# Patient Record
Sex: Female | Born: 1955
Health system: Southern US, Community
[De-identification: ages and names within clinical notes are randomized; demographics above are authoritative.]

## PROBLEM LIST (undated history)

## (undated) DIAGNOSIS — D72829 Elevated white blood cell count, unspecified: Secondary | ICD-10-CM

## (undated) DIAGNOSIS — F411 Generalized anxiety disorder: Secondary | ICD-10-CM

## (undated) DIAGNOSIS — T8859XA Other complications of anesthesia, initial encounter: Secondary | ICD-10-CM

## (undated) DIAGNOSIS — K219 Gastro-esophageal reflux disease without esophagitis: Secondary | ICD-10-CM

## (undated) DIAGNOSIS — T4145XA Adverse effect of unspecified anesthetic, initial encounter: Secondary | ICD-10-CM

## (undated) DIAGNOSIS — R5383 Other fatigue: Secondary | ICD-10-CM

## (undated) DIAGNOSIS — G4700 Insomnia, unspecified: Secondary | ICD-10-CM

## (undated) DIAGNOSIS — E785 Hyperlipidemia, unspecified: Secondary | ICD-10-CM

## (undated) DIAGNOSIS — C801 Malignant (primary) neoplasm, unspecified: Secondary | ICD-10-CM

## (undated) DIAGNOSIS — Z9889 Other specified postprocedural states: Secondary | ICD-10-CM

## (undated) DIAGNOSIS — N951 Menopausal and female climacteric states: Secondary | ICD-10-CM

## (undated) DIAGNOSIS — M542 Cervicalgia: Secondary | ICD-10-CM

## (undated) DIAGNOSIS — J309 Allergic rhinitis, unspecified: Secondary | ICD-10-CM

## (undated) DIAGNOSIS — Z9221 Personal history of antineoplastic chemotherapy: Secondary | ICD-10-CM

## (undated) DIAGNOSIS — E8881 Metabolic syndrome: Secondary | ICD-10-CM

## (undated) DIAGNOSIS — B351 Tinea unguium: Secondary | ICD-10-CM

## (undated) DIAGNOSIS — E663 Overweight: Secondary | ICD-10-CM

## (undated) DIAGNOSIS — N289 Disorder of kidney and ureter, unspecified: Secondary | ICD-10-CM

## (undated) DIAGNOSIS — J45909 Unspecified asthma, uncomplicated: Secondary | ICD-10-CM

## (undated) DIAGNOSIS — E559 Vitamin D deficiency, unspecified: Secondary | ICD-10-CM

## (undated) DIAGNOSIS — J40 Bronchitis, not specified as acute or chronic: Secondary | ICD-10-CM

## (undated) DIAGNOSIS — R112 Nausea with vomiting, unspecified: Secondary | ICD-10-CM

## (undated) DIAGNOSIS — M4802 Spinal stenosis, cervical region: Secondary | ICD-10-CM

## (undated) DIAGNOSIS — G43009 Migraine without aura, not intractable, without status migrainosus: Secondary | ICD-10-CM

## (undated) DIAGNOSIS — L989 Disorder of the skin and subcutaneous tissue, unspecified: Secondary | ICD-10-CM

## (undated) DIAGNOSIS — M1990 Unspecified osteoarthritis, unspecified site: Secondary | ICD-10-CM

## (undated) DIAGNOSIS — I509 Heart failure, unspecified: Secondary | ICD-10-CM

## (undated) DIAGNOSIS — M549 Dorsalgia, unspecified: Secondary | ICD-10-CM

## (undated) DIAGNOSIS — I10 Essential (primary) hypertension: Secondary | ICD-10-CM

## (undated) DIAGNOSIS — R5381 Other malaise: Secondary | ICD-10-CM

## (undated) DIAGNOSIS — Z79899 Other long term (current) drug therapy: Secondary | ICD-10-CM

## (undated) HISTORY — PX: TONSILLECTOMY: SUR1361

## (undated) HISTORY — DX: Metabolic syndrome: E88.810

## (undated) HISTORY — DX: Overweight: E66.3

## (undated) HISTORY — DX: Generalized anxiety disorder: F41.1

## (undated) HISTORY — PX: NECK SURGERY: SHX720

## (undated) HISTORY — DX: Tinea unguium: B35.1

## (undated) HISTORY — DX: Metabolic syndrome: E88.81

## (undated) HISTORY — DX: Heart failure, unspecified: I50.9

## (undated) HISTORY — DX: Allergic rhinitis, unspecified: J30.9

## (undated) HISTORY — DX: Other malaise: R53.81

## (undated) HISTORY — DX: Essential (primary) hypertension: I10

## (undated) HISTORY — DX: Dorsalgia, unspecified: M54.9

## (undated) HISTORY — DX: Menopausal and female climacteric states: N95.1

## (undated) HISTORY — PX: BACK SURGERY: SHX140

## (undated) HISTORY — DX: Other long term (current) drug therapy: Z79.899

## (undated) HISTORY — PX: ABDOMINAL HYSTERECTOMY: SHX81

## (undated) HISTORY — DX: Gastro-esophageal reflux disease without esophagitis: K21.9

## (undated) HISTORY — DX: Vitamin D deficiency, unspecified: E55.9

## (undated) HISTORY — DX: Disorder of the skin and subcutaneous tissue, unspecified: L98.9

## (undated) HISTORY — DX: Spinal stenosis, cervical region: M48.02

## (undated) HISTORY — DX: Other fatigue: R53.83

## (undated) HISTORY — DX: Cervicalgia: M54.2

## (undated) HISTORY — DX: Elevated white blood cell count, unspecified: D72.829

## (undated) HISTORY — DX: Migraine without aura, not intractable, without status migrainosus: G43.009

## (undated) HISTORY — DX: Insomnia, unspecified: G47.00

## (undated) HISTORY — DX: Hyperlipidemia, unspecified: E78.5

---

## 2000-04-01 ENCOUNTER — Other Ambulatory Visit: Admission: RE | Admit: 2000-04-01 | Discharge: 2000-04-01 | Payer: Self-pay | Admitting: Obstetrics and Gynecology

## 2002-04-01 ENCOUNTER — Encounter: Payer: Self-pay | Admitting: Physical Medicine and Rehabilitation

## 2002-04-01 ENCOUNTER — Encounter
Admission: RE | Admit: 2002-04-01 | Discharge: 2002-04-01 | Payer: Self-pay | Admitting: Physical Medicine and Rehabilitation

## 2003-05-20 ENCOUNTER — Inpatient Hospital Stay (HOSPITAL_COMMUNITY): Admission: RE | Admit: 2003-05-20 | Discharge: 2003-05-26 | Payer: Self-pay | Admitting: Specialist

## 2004-07-12 ENCOUNTER — Inpatient Hospital Stay (HOSPITAL_COMMUNITY): Admission: RE | Admit: 2004-07-12 | Discharge: 2004-07-15 | Payer: Self-pay | Admitting: Specialist

## 2004-08-29 ENCOUNTER — Ambulatory Visit: Payer: Self-pay

## 2005-05-29 ENCOUNTER — Ambulatory Visit: Payer: Self-pay

## 2005-05-30 ENCOUNTER — Emergency Department: Payer: Self-pay | Admitting: Emergency Medicine

## 2005-12-03 ENCOUNTER — Ambulatory Visit: Payer: Self-pay

## 2006-01-08 ENCOUNTER — Ambulatory Visit: Payer: Self-pay | Admitting: Pain Medicine

## 2006-01-20 ENCOUNTER — Ambulatory Visit: Payer: Self-pay | Admitting: Pain Medicine

## 2006-01-28 ENCOUNTER — Ambulatory Visit: Payer: Self-pay | Admitting: Pain Medicine

## 2006-02-10 ENCOUNTER — Ambulatory Visit: Payer: Self-pay | Admitting: Pain Medicine

## 2006-04-10 ENCOUNTER — Ambulatory Visit: Payer: Self-pay | Admitting: Pain Medicine

## 2006-04-18 ENCOUNTER — Ambulatory Visit: Payer: Self-pay | Admitting: Pain Medicine

## 2006-04-23 ENCOUNTER — Ambulatory Visit: Payer: Self-pay | Admitting: Pain Medicine

## 2006-05-14 ENCOUNTER — Ambulatory Visit: Payer: Self-pay | Admitting: Pain Medicine

## 2006-06-04 ENCOUNTER — Ambulatory Visit: Payer: Self-pay | Admitting: Pain Medicine

## 2006-07-02 ENCOUNTER — Ambulatory Visit: Payer: Self-pay | Admitting: Pain Medicine

## 2006-07-29 ENCOUNTER — Ambulatory Visit: Payer: Self-pay | Admitting: Physician Assistant

## 2006-08-27 ENCOUNTER — Ambulatory Visit: Payer: Self-pay | Admitting: Physician Assistant

## 2006-09-16 ENCOUNTER — Ambulatory Visit: Payer: Self-pay | Admitting: Pain Medicine

## 2006-10-06 ENCOUNTER — Ambulatory Visit: Payer: Self-pay | Admitting: Pain Medicine

## 2006-11-11 ENCOUNTER — Ambulatory Visit: Payer: Self-pay | Admitting: Otolaryngology

## 2006-11-19 ENCOUNTER — Ambulatory Visit: Payer: Self-pay | Admitting: Physician Assistant

## 2006-12-05 ENCOUNTER — Ambulatory Visit: Payer: Self-pay

## 2006-12-17 ENCOUNTER — Ambulatory Visit: Payer: Self-pay | Admitting: Physician Assistant

## 2006-12-30 ENCOUNTER — Ambulatory Visit: Payer: Self-pay | Admitting: Pain Medicine

## 2007-01-19 ENCOUNTER — Ambulatory Visit: Payer: Self-pay | Admitting: Physician Assistant

## 2007-02-11 ENCOUNTER — Ambulatory Visit: Payer: Self-pay | Admitting: Physician Assistant

## 2007-02-20 ENCOUNTER — Ambulatory Visit: Payer: Self-pay | Admitting: Obstetrics and Gynecology

## 2007-02-23 ENCOUNTER — Ambulatory Visit: Payer: Self-pay | Admitting: Obstetrics and Gynecology

## 2007-03-24 ENCOUNTER — Ambulatory Visit: Payer: Self-pay | Admitting: Pain Medicine

## 2007-05-14 ENCOUNTER — Ambulatory Visit: Payer: Self-pay | Admitting: Physician Assistant

## 2007-06-15 ENCOUNTER — Ambulatory Visit: Payer: Self-pay | Admitting: Physician Assistant

## 2007-07-09 ENCOUNTER — Ambulatory Visit: Payer: Self-pay | Admitting: Pain Medicine

## 2007-07-15 ENCOUNTER — Ambulatory Visit: Payer: Self-pay | Admitting: Physician Assistant

## 2007-08-19 ENCOUNTER — Ambulatory Visit: Payer: Self-pay | Admitting: Pain Medicine

## 2007-09-29 ENCOUNTER — Ambulatory Visit: Payer: Self-pay | Admitting: Physician Assistant

## 2007-10-21 ENCOUNTER — Ambulatory Visit: Payer: Self-pay | Admitting: Physician Assistant

## 2007-11-05 ENCOUNTER — Ambulatory Visit: Payer: Self-pay | Admitting: Pain Medicine

## 2008-01-19 ENCOUNTER — Ambulatory Visit: Payer: Self-pay

## 2012-02-18 ENCOUNTER — Ambulatory Visit: Payer: Self-pay | Admitting: Emergency Medicine

## 2012-02-19 ENCOUNTER — Ambulatory Visit: Payer: Self-pay | Admitting: Emergency Medicine

## 2012-02-19 LAB — HEPATIC FUNCTION PANEL A (ARMC)
Albumin: 3.6 g/dL (ref 3.4–5.0)
Alkaline Phosphatase: 96 U/L (ref 50–136)
Bilirubin, Direct: 0.1 mg/dL (ref 0.00–0.20)
Bilirubin,Total: 0.4 mg/dL (ref 0.2–1.0)
Total Protein: 6.9 g/dL (ref 6.4–8.2)

## 2012-02-20 ENCOUNTER — Ambulatory Visit: Payer: Self-pay | Admitting: Emergency Medicine

## 2012-02-21 LAB — PATHOLOGY REPORT

## 2012-04-06 ENCOUNTER — Ambulatory Visit: Payer: Self-pay | Admitting: Family Medicine

## 2012-04-06 LAB — HM MAMMOGRAPHY: HM Mammogram: NORMAL

## 2012-04-07 ENCOUNTER — Ambulatory Visit: Payer: Self-pay | Admitting: Family Medicine

## 2013-02-04 LAB — HM PAP SMEAR: HM PAP: NORMAL

## 2013-02-06 ENCOUNTER — Ambulatory Visit: Payer: Self-pay | Admitting: Family Medicine

## 2013-04-27 LAB — HM DEXA SCAN

## 2013-04-28 DIAGNOSIS — M5010 Cervical disc disorder with radiculopathy, unspecified cervical region: Secondary | ICD-10-CM | POA: Insufficient documentation

## 2013-07-23 DIAGNOSIS — M5412 Radiculopathy, cervical region: Secondary | ICD-10-CM | POA: Insufficient documentation

## 2013-07-23 DIAGNOSIS — M4802 Spinal stenosis, cervical region: Secondary | ICD-10-CM | POA: Insufficient documentation

## 2013-07-27 LAB — LIPID PANEL
CHOLESTEROL: 165 mg/dL (ref 0–200)
HDL: 30 mg/dL — AB (ref 35–70)
TRIGLYCERIDES: 437 mg/dL — AB (ref 40–160)

## 2013-07-27 LAB — HEMOGLOBIN A1C: HEMOGLOBIN A1C: 5.7 % (ref 4.0–6.0)

## 2013-11-30 ENCOUNTER — Encounter: Payer: Self-pay | Admitting: Neurology

## 2013-12-01 ENCOUNTER — Encounter (INDEPENDENT_AMBULATORY_CARE_PROVIDER_SITE_OTHER): Payer: Self-pay

## 2013-12-01 ENCOUNTER — Encounter: Payer: Self-pay | Admitting: Neurology

## 2013-12-01 ENCOUNTER — Encounter (INDEPENDENT_AMBULATORY_CARE_PROVIDER_SITE_OTHER): Payer: Self-pay | Admitting: Neurology

## 2013-12-01 VITALS — BP 121/75 | HR 71 | Ht 63.0 in | Wt 181.2 lb

## 2013-12-01 DIAGNOSIS — M5412 Radiculopathy, cervical region: Secondary | ICD-10-CM

## 2013-12-01 DIAGNOSIS — M5010 Cervical disc disorder with radiculopathy, unspecified cervical region: Secondary | ICD-10-CM

## 2014-02-28 ENCOUNTER — Ambulatory Visit: Payer: Self-pay | Admitting: Gastroenterology

## 2014-02-28 LAB — HM COLONOSCOPY: HM Colonoscopy: NORMAL

## 2014-03-04 ENCOUNTER — Ambulatory Visit: Payer: Self-pay | Admitting: Gastroenterology

## 2014-07-05 NOTE — Op Note (Signed)
PATIENT NAME:  Jessica Burgess, SHAFRAN MR#:  [id number] DATE OF BIRTH:  03/18/1956  DATE OF PROCEDURE:  02/20/2012  PREOPERATIVE DIAGNOSIS: Acute cholecystitis.   POSTOPERATIVE DIAGNOSIS: Acute cholecystitis.   PROCEDURE PERFORMED: Laparoscopic cholecystectomy.   SURGEON:  Vella Kohler, MD.   ANESTHESIA: General.   INDICATION FOR PROCEDURE: This patient was seen by me because of pain in the abdomen. She had ultrasound of gallbladder done which showed gallstone and was a big, large gallstone. The patient was then brought to surgery.   DESCRIPTION OF PROCEDURE:  Under general anesthesia, the abdomen was then prepped and draped. A small incision was made above the umbilicus. After cutting skin and subcutaneous tissue, the fascia was reached. Fascia was cut. The abdomen was entered under direct vision. Trocar was inserted. Another trocar was put in the epigastric region, two 5-mm put in the right upper quadrant of the abdomen. The gallbladder was visualized and was found to have a lot of adhesions. The gallbladder was then lifted up. Adhesions were then released down until all the gallbladder was freed up. After that, the cystic artery was then clipped and cut and the cholangiogram was then performed. A Kumar catheter showed normal common bile duct. After that, the cystic duct was clipped and cut and the gallbladder was removed from the liver bed without any problem from the bellybutton. The patient was found to have one very large stone and a lot of small stones. After this, we irrigated the whole area and made sure there was no bleeding there and then removed all the ports from the abdomen and made sure there was no bleeding. The umbilical area was then closed with interrupted 0 Vicryl sutures and the staples were applied. Marcaine was injected. The patient tolerated the procedure well and was sent to the recovery room in satisfactory condition.  ____________________________ Welford Roche Phylis Bougie,  MD msh:ap D: 02/20/2012 15:06:17 ET                T: 02/20/2012 15:33:33 ET              JOB#: [id number] cc: Bethena Roys. Ancil Boozer, MD Sharene Butters MD ELECTRONICALLY SIGNED 02/24/2012 13:55

## 2014-08-21 ENCOUNTER — Other Ambulatory Visit: Payer: Self-pay | Admitting: Family Medicine

## 2014-08-25 ENCOUNTER — Ambulatory Visit (INDEPENDENT_AMBULATORY_CARE_PROVIDER_SITE_OTHER): Payer: Medicare Other | Admitting: Family Medicine

## 2014-08-25 ENCOUNTER — Other Ambulatory Visit: Payer: Self-pay | Admitting: Family Medicine

## 2014-08-25 ENCOUNTER — Encounter: Payer: Self-pay | Admitting: Family Medicine

## 2014-08-25 VITALS — BP 118/66 | HR 68 | Temp 98.1°F | Resp 18 | Ht 63.5 in | Wt 178.3 lb

## 2014-08-25 DIAGNOSIS — B351 Tinea unguium: Secondary | ICD-10-CM | POA: Insufficient documentation

## 2014-08-25 DIAGNOSIS — G4700 Insomnia, unspecified: Secondary | ICD-10-CM | POA: Diagnosis not present

## 2014-08-25 DIAGNOSIS — E8881 Metabolic syndrome: Secondary | ICD-10-CM | POA: Insufficient documentation

## 2014-08-25 DIAGNOSIS — F32A Depression, unspecified: Secondary | ICD-10-CM

## 2014-08-25 DIAGNOSIS — E669 Obesity, unspecified: Secondary | ICD-10-CM | POA: Insufficient documentation

## 2014-08-25 DIAGNOSIS — E559 Vitamin D deficiency, unspecified: Secondary | ICD-10-CM | POA: Insufficient documentation

## 2014-08-25 DIAGNOSIS — M542 Cervicalgia: Secondary | ICD-10-CM

## 2014-08-25 DIAGNOSIS — G43009 Migraine without aura, not intractable, without status migrainosus: Secondary | ICD-10-CM | POA: Diagnosis not present

## 2014-08-25 DIAGNOSIS — F418 Other specified anxiety disorders: Secondary | ICD-10-CM

## 2014-08-25 DIAGNOSIS — Z1231 Encounter for screening mammogram for malignant neoplasm of breast: Secondary | ICD-10-CM

## 2014-08-25 DIAGNOSIS — B009 Herpesviral infection, unspecified: Secondary | ICD-10-CM | POA: Insufficient documentation

## 2014-08-25 DIAGNOSIS — N951 Menopausal and female climacteric states: Secondary | ICD-10-CM | POA: Insufficient documentation

## 2014-08-25 DIAGNOSIS — G8929 Other chronic pain: Secondary | ICD-10-CM | POA: Insufficient documentation

## 2014-08-25 DIAGNOSIS — R739 Hyperglycemia, unspecified: Secondary | ICD-10-CM | POA: Insufficient documentation

## 2014-08-25 DIAGNOSIS — Z789 Other specified health status: Secondary | ICD-10-CM | POA: Insufficient documentation

## 2014-08-25 DIAGNOSIS — E785 Hyperlipidemia, unspecified: Secondary | ICD-10-CM | POA: Insufficient documentation

## 2014-08-25 DIAGNOSIS — M549 Dorsalgia, unspecified: Secondary | ICD-10-CM

## 2014-08-25 DIAGNOSIS — F419 Anxiety disorder, unspecified: Secondary | ICD-10-CM

## 2014-08-25 DIAGNOSIS — F329 Major depressive disorder, single episode, unspecified: Secondary | ICD-10-CM

## 2014-08-25 DIAGNOSIS — K219 Gastro-esophageal reflux disease without esophagitis: Secondary | ICD-10-CM | POA: Insufficient documentation

## 2014-08-25 DIAGNOSIS — Z9229 Personal history of other drug therapy: Secondary | ICD-10-CM | POA: Insufficient documentation

## 2014-08-25 MED ORDER — ESTRADIOL 0.5 MG PO TABS: 0.5000 mg | ORAL_TABLET | Freq: Every day | ORAL | Status: DC

## 2014-08-25 MED ORDER — OXYCODONE-ACETAMINOPHEN 10-325 MG PO TABS: 1.0000 | ORAL_TABLET | Freq: Four times a day (QID) | ORAL | Status: DC | PRN

## 2014-08-25 MED ORDER — ATORVASTATIN CALCIUM 40 MG PO TABS: 40.0000 mg | ORAL_TABLET | Freq: Every day | ORAL | Status: DC

## 2014-08-25 MED ORDER — OMEPRAZOLE 20 MG PO CPDR: 20.0000 mg | DELAYED_RELEASE_CAPSULE | Freq: Every day | ORAL | Status: DC

## 2014-08-25 MED ORDER — ZOLPIDEM TARTRATE 10 MG PO TABS: ORAL_TABLET | ORAL | Status: DC

## 2014-08-25 NOTE — Progress Notes (Signed)
Name: Jessica Burgess   MRN: [id number]    DOB: 1955-09-02   Date:08/25/2014       Progress Note  Subjective  Chief Complaint  Chief Complaint  Patient presents with  . Medication Refill    3 month F/U  . Depression    unchanged with Wellbutrin  . Insomnia    Falls asleep within the hour with medication, Total 6 hours nightly  . Allergic Rhinitis     Improving with medication  . Menopause    Severe Hot Flashes, Excessive Sweating and Mood Swings.  . Migraine    unchanged having to take 2 Imitrex to make them go away     HPI  Chronic neck pain: she has spinal stenosis and disc herniation, pain is 4/10, can get to a 10/10, she has seen a neurosurgeon and is planning on having surgery this Fall. She is taking oxycodone as prescribed, she is not sharing her prescription or selling. She denies side effects - no constipation, or drowsiness.   Migraine: she has headaches from neck pain, but her migraines as described as throbbing, associated with nausea, but no vomiting, pain can be on right side, left side of a band around her scalp, she also has allodynia. Having about 8 episodes per month.  Last week was a hard week. Imitrex seems to help  Hot Flashes:  She was stable with premarin, but has noticed increase in symptoms since she started Wellbutrin.  She denies hot flashes.  Dyslipidemia: reviewed labs done 08/22/14 and is improving, HDL was 40, LDL was 51, triglycerides down to 235, she is doing well. Taking atorvastatin   Patient Active Problem List   Diagnosis Date Noted  . Back pain, chronic 08/25/2014  . Insomnia, persistent 08/25/2014  . Chronic cervical pain 08/25/2014  . Anxiety and depression 08/25/2014  . Dyslipidemia 08/25/2014  . Gastro-esophageal reflux disease without esophagitis 08/25/2014  . H/O high risk medication treatment 08/25/2014  . Blood glucose elevated 08/25/2014  . Migraine without aura and without status migrainosus, not intractable 08/25/2014  .  Climacteric 08/25/2014  . Dysmetabolic syndrome [id number]  . Fungal infection of toenail 08/25/2014  . Obesity (BMI 30.0-34.9) 08/25/2014  . Vitamin D deficiency 08/25/2014  . Engages in travel abroad 08/25/2014  . Cervical spinal stenosis 07/23/2013  . Cervical disc disorder with radiculopathy 04/28/2013    Past Surgical History  Procedure Laterality Date  . Neck surgery      x3  . Back surgery      x2 Lower   . Tonsillectomy    . Abdominal hysterectomy      Family History  Problem Relation Age of Onset  . Depression Mother   . Migraines Mother   . Dementia Father   . Hyperlipidemia Brother     History   Social History  . Marital Status: Single    Spouse Name: N/A  . Number of Children: 0  . Years of Education: 12+   Occupational History  .    [employer] Disabled     Social History Main Topics  . Smoking status: Former Smoker -- 1.50 packs/day for 20 years    Types: Cigarettes    Quit date: 08/25/1999  . Smokeless tobacco: Never Used  . Alcohol Use: No  . Drug Use: No  . Sexual Activity: No   Other Topics Concern  . Not on file   Social History Narrative   Patient is single.    Patient lives with roommates.  Patient on disability    Patient has no children.    Patient has some college      Current outpatient prescriptions:  .  aspirin 81 MG tablet, Take 1 tablet by mouth daily., Disp: , Rfl:  .  atorvastatin (LIPITOR) 40 MG tablet, Take 1 tablet (40 mg total) by mouth daily., Disp: 90 tablet, Rfl: 4 .  Biotin 2500 MCG CAPS, Take by mouth., Disp: , Rfl:  .  Cholecalciferol (VITAMIN D-1000 MAX ST) 1000 UNITS tablet, Take 1 tablet by mouth 2 (two) times daily., Disp: , Rfl:  .  clonazePAM (KLONOPIN) 0.5 MG tablet, Take 1 tablet by mouth daily as needed., Disp: , Rfl: 0 .  Cyanocobalamin (B-12 PO), Take by mouth., Disp: , Rfl:  .  cyclobenzaprine (FLEXERIL) 10 MG tablet, Take 10 mg by mouth 3 (three) times daily as needed for muscle spasms., Disp: , Rfl:  .   estradiol (ESTRACE) 0.5 MG tablet, Take 1 tablet (0.5 mg total) by mouth daily., Disp: 90 tablet, Rfl: 4 .  ferrous sulfate (IRON SUPPLEMENT) 325 (65 FE) MG tablet, Take 1 tablet by mouth daily., Disp: , Rfl:  .  FLUoxetine (PROZAC) 20 MG capsule, take 3 capsules by mouth once daily, Disp: 270 capsule, Rfl: 1 .  fluticasone (FLONASE) 50 MCG/ACT nasal spray, Place 2 sprays into both nostrils as needed. , Disp: , Rfl:  .  loratadine (CLARITIN) 10 MG tablet, Take 10 mg by mouth daily., Disp: , Rfl:  .  Magnesium Oxide 500 MG CAPS, Take 500 mg by mouth 2 (two) times daily. , Disp: , Rfl:  .  Multiple Vitamins-Minerals (MULTIVITAMIN PO), Take by mouth., Disp: , Rfl:  .  Omega-3 Fatty Acids (FISH OIL) 1200 MG CAPS, Take 1,200 mg by mouth 2 (two) times daily. , Disp: , Rfl:  .  omeprazole (PRILOSEC) 20 MG capsule, Take 1 capsule (20 mg total) by mouth daily., Disp: 90 capsule, Rfl: 1 .  oxyCODONE-acetaminophen (PERCOCET) 10-325 MG per tablet, Take 1 tablet by mouth every 6 (six) hours as needed for pain., Disp: 100 tablet, Rfl: 0 .  SUMAtriptan (IMITREX) 100 MG tablet, Take 100 mg by mouth every 2 (two) hours as needed for migraine or headache. May repeat in 2 hours if headache persists or recurs., Disp: , Rfl:  .  zolpidem (AMBIEN) 10 MG tablet, take 1 tablet by mouth as directed IN THE EVENING for sleep, Disp: 90 tablet, Rfl: 0 .  oxyCODONE-acetaminophen (PERCOCET) 10-325 MG per tablet, Take 1 tablet by mouth every 6 (six) hours as needed for pain., Disp: 100 tablet, Rfl: 0 .  oxyCODONE-acetaminophen (PERCOCET) 10-325 MG per tablet, Take 1 tablet by mouth every 6 (six) hours as needed for pain., Disp: 100 tablet, Rfl: 0  No Known Allergies   ROS  Constitutional: Negative for fever or weight change.  Respiratory: Negative for cough and shortness of breath.   Cardiovascular: Negative for chest pain or palpitations.  Gastrointestinal: Negative for abdominal pain, no bowel changes.  Musculoskeletal:  Negative for gait problem or joint swelling.  Skin: Negative for rash.  Neurological: Negative for dizziness but has recurrent headaches  No other specific complaints in a complete review of systems (except as listed in HPI above).  Objective  Filed Vitals:   08/25/14 0825  BP: 118/66  Pulse: 68  Temp: 98.1 F (36.7 C)  TempSrc: Oral  Resp: 18  Height: 5' 3.5" (1.613 m)  Weight: 178 lb 4.8 oz (80.876 kg)  SpO2: 96%  Physical Exam Constitutional: Patient appears well-developed and well-nourished. No distress. Obese  HENT: Head: Normocephalic and atraumatic. Ears: B TMs ok, no erythema or effusion; Nose: Nose normal. Mouth/Throat: Oropharynx is clear and moist. No oropharyngeal exudate.  Eyes: Conjunctivae and EOM are normal. Pupils are equal, round, and reactive to light. No scleral icterus.  Neck: Decrease rom of neck, tender to touch posteriorly No JVD present. No thyromegaly present.  Cardiovascular: Normal rate, regular rhythm and normal heart sounds.  No murmur heard. No BLE edema. Pulmonary/Chest: Effort normal and breath sounds normal. No respiratory distress. Abdominal: Soft. No distension. There is no tenderness. no masses Musculoskeletal: Normal range of motion, no joint effusions. No gross deformities Neurological: he is alert and oriented to person, place, and time. No cranial nerve deficit. Coordination, balance, strength, speech and gait are normal.  Skin: Skin is warm and dry. No rash noted. No erythema.  Psychiatric: Patient has a normal mood and affect. behavior is normal. Judgment and thought content normal.     PHQ2/9: Depression screen PHQ 2/9 08/25/2014  Decreased Interest 1  Down, Depressed, Hopeless 2  PHQ - 2 Score 3  Altered sleeping 3  Tired, decreased energy 3  Change in appetite 0  Feeling bad or failure about yourself  0  Trouble concentrating 1  Moving slowly or fidgety/restless 0  Suicidal thoughts 0  PHQ-9 Score 10  Difficult doing  work/chores Not difficult at all     Fall Risk: Fall Risk  08/25/2014  Falls in the past year? Yes  Number falls in past yr: 1  Injury with Fall? No    Assessment & Plan  1. Chronic cervical pain She will have neck surgery /revision this Fall in [place], she has daily neck pain and migraine headaches is getting worse - oxyCODONE-acetaminophen (PERCOCET) 10-325 MG per tablet; Take 1 tablet by mouth every 6 (six) hours as needed for pain.  Dispense: 100 tablet; Refill: 0 - oxyCODONE-acetaminophen (PERCOCET) 10-325 MG per tablet; Take 1 tablet by mouth every 6 (six) hours as needed for pain.  Dispense: 100 tablet; Refill: 0 - omeprazole (PRILOSEC) 20 MG capsule; Take 1 capsule (20 mg total) by mouth daily.  Dispense: 90 capsule; Refill: 1  2. Migraine without aura and without status migrainosus, not intractable Responds to Imitrex, but sometimes seems to be triggered by neck pain   3. Insomnia, persistent Stable on medication - zolpidem (AMBIEN) 10 MG tablet; take 1 tablet by mouth as directed IN THE EVENING for sleep  Dispense: 90 tablet; Refill: 0  4. Obesity (BMI 30.0-34.9) She has been following a low carb diet, and walks twice daily, lost another pound since last visit , continue to hard work  5. Climacteric She still has some hot flashes, but seems to have gotten worse since started Wellbturin, discussed [facility] studies again - estradiol (ESTRACE) 0.5 MG tablet; Take 1 tablet (0.5 mg total) by mouth daily.  Dispense: 90 tablet; Refill: 4  6. Dyslipidemia Reviewed labs, improving, continue medications and dietary modification - atorvastatin (LIPITOR) 40 MG tablet; Take 1 tablet (40 mg total) by mouth daily.  Dispense: 90 tablet; Refill: 4  7. Anxiety and depression She continues to have fatigue, does not feel sad, states fluoxetine seems to be working, but Wellbutrin did not increase energy and caused her to sweat too much. We will stop medication

## 2014-08-29 ENCOUNTER — Ambulatory Visit
Admission: RE | Admit: 2014-08-29 | Discharge: 2014-08-29 | Disposition: A | Discharge status: Home / Self Care | Payer: Medicare Other | Source: Ambulatory Visit | Attending: Family Medicine | Admitting: Family Medicine

## 2014-08-29 ENCOUNTER — Other Ambulatory Visit: Payer: Self-pay | Admitting: Family Medicine

## 2014-08-29 DIAGNOSIS — Z1231 Encounter for screening mammogram for malignant neoplasm of breast: Secondary | ICD-10-CM | POA: Insufficient documentation

## 2014-11-02 ENCOUNTER — Other Ambulatory Visit: Payer: Self-pay | Admitting: Neurosurgery

## 2014-11-19 ENCOUNTER — Other Ambulatory Visit: Payer: Self-pay | Admitting: Family Medicine

## 2014-11-25 ENCOUNTER — Ambulatory Visit (INDEPENDENT_AMBULATORY_CARE_PROVIDER_SITE_OTHER): Payer: Medicare Other | Admitting: Family Medicine

## 2014-11-25 ENCOUNTER — Encounter: Payer: Self-pay | Admitting: Family Medicine

## 2014-11-25 ENCOUNTER — Ambulatory Visit: Payer: Medicare Other | Admitting: Family Medicine

## 2014-11-25 VITALS — BP 116/66 | HR 94 | Temp 97.5°F | Resp 16 | Ht 64.0 in | Wt 180.2 lb

## 2014-11-25 DIAGNOSIS — G43009 Migraine without aura, not intractable, without status migrainosus: Secondary | ICD-10-CM

## 2014-11-25 DIAGNOSIS — F419 Anxiety disorder, unspecified: Secondary | ICD-10-CM

## 2014-11-25 DIAGNOSIS — F418 Other specified anxiety disorders: Secondary | ICD-10-CM | POA: Diagnosis not present

## 2014-11-25 DIAGNOSIS — M5010 Cervical disc disorder with radiculopathy, unspecified cervical region: Secondary | ICD-10-CM

## 2014-11-25 DIAGNOSIS — G4700 Insomnia, unspecified: Secondary | ICD-10-CM

## 2014-11-25 DIAGNOSIS — M542 Cervicalgia: Secondary | ICD-10-CM | POA: Diagnosis not present

## 2014-11-25 DIAGNOSIS — N951 Menopausal and female climacteric states: Secondary | ICD-10-CM | POA: Diagnosis not present

## 2014-11-25 DIAGNOSIS — K219 Gastro-esophageal reflux disease without esophagitis: Secondary | ICD-10-CM

## 2014-11-25 DIAGNOSIS — Z23 Encounter for immunization: Secondary | ICD-10-CM | POA: Diagnosis not present

## 2014-11-25 DIAGNOSIS — F329 Major depressive disorder, single episode, unspecified: Secondary | ICD-10-CM

## 2014-11-25 DIAGNOSIS — R42 Dizziness and giddiness: Secondary | ICD-10-CM | POA: Diagnosis not present

## 2014-11-25 DIAGNOSIS — G8929 Other chronic pain: Secondary | ICD-10-CM | POA: Diagnosis not present

## 2014-11-25 MED ORDER — OXYCODONE-ACETAMINOPHEN 10-325 MG PO TABS: 1.0000 | ORAL_TABLET | Freq: Four times a day (QID) | ORAL | Status: DC | PRN

## 2014-11-25 MED ORDER — CYCLOBENZAPRINE HCL 10 MG PO TABS: 10.0000 mg | ORAL_TABLET | Freq: Three times a day (TID) | ORAL | Status: DC | PRN

## 2014-11-25 MED ORDER — ZOLPIDEM TARTRATE 10 MG PO TABS: ORAL_TABLET | ORAL | Status: DC

## 2014-11-25 MED ORDER — OMEPRAZOLE 40 MG PO CPDR: 40.0000 mg | DELAYED_RELEASE_CAPSULE | Freq: Every day | ORAL | Status: DC

## 2014-11-25 NOTE — Progress Notes (Signed)
Name: Jessica Burgess   MRN: [id number]    DOB: 1956/03/16   Date:11/25/2014       Progress Note  Subjective  Chief Complaint  Chief Complaint  Patient presents with  . Medication Refill    3 month F/U  . Dizziness    [place]-only on right side, patient is taking Meclizine but is still very dizzy, nausea, and states the room spins (specially if she darts her eyes over when reading)  . Cervical pain    Surgery is planned on February 13, 2015 Dr. Newman Pies, unchanged symptoms.   . Migraine    Patient states they are worsten, had 4 in the past 3 days. Feels like if she moves her head the wrong way they set them off.  . Hyperlipidemia    No problems  . Allergic Rhinitis     Well Controlled with medications only taking Loratadine-once daily  . 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 Pain is described as a tight /pressure sensation like a band around her head, associated with nausea and occasional vomiting.   Cervical pain with radiculitis: previous history of neck surgery times three, having radiculitis more often, seen by neurosurgeon and will have another surgery, fusion in November 28 th, 2016. Taking Percocet to control symptoms, also muscle relaxer prn  Vertigo: she states that for about three months now, she has noticed sensation of movement ( rooms moves around her - spins ) , it happens when she turns in bed. Symptoms are intermittent throughout the day. Each episode if brief, and resolves when she keeps her head still.  Insomnia: doing well on Ambien, able to fall and stay asleep for at least for a minimum of 8 hours  Anxiety/Depression: having pain is not good for her mood, but coping okay, looking forward to having surgery in November   Patient Active Problem List   Diagnosis Date Noted  . Back pain,  chronic 08/25/2014  . Insomnia, persistent 08/25/2014  . Chronic cervical pain 08/25/2014  . Anxiety and depression 08/25/2014  . Dyslipidemia 08/25/2014  . Gastro-esophageal reflux disease without esophagitis 08/25/2014  . H/O high risk medication treatment 08/25/2014  . Blood glucose elevated 08/25/2014  . Migraine without aura and without status migrainosus, not intractable 08/25/2014  . Climacteric 08/25/2014  . Dysmetabolic syndrome [id number]  . Fungal infection of toenail 08/25/2014  . Obesity (BMI 30.0-34.9) 08/25/2014  . Vitamin D deficiency 08/25/2014  . Engages in travel abroad 08/25/2014  . Cervical spinal stenosis 07/23/2013  . Cervical disc disorder with radiculopathy 04/28/2013    Past Surgical History  Procedure Laterality Date  . Neck surgery      x3  . Back surgery      x2 Lower   . Tonsillectomy    . Abdominal hysterectomy      Family History  Problem Relation Age of Onset  . Depression Mother   . Migraines Mother   . Dementia Father   . Hyperlipidemia Brother   . Breast cancer Paternal Aunt     41s    Social History   Social History  . Marital Status: Single    Spouse Name: N/A  . Number of Children: 0  . Years of Education: 12+   Occupational History  .    [employer]  Disabled     Social History Main Topics  . Smoking status: Former Smoker -- 1.50 packs/day for 20 years    Types: Cigarettes    Start date: 03/19/1979    Quit date: 08/25/1999  . Smokeless tobacco: Never Used  . Alcohol Use: 0.0 oz/week    0 Standard drinks or equivalent per week     Comment: wine occassionally  . Drug Use: No  . Sexual Activity: No   Other Topics Concern  . Not on file   Social History Narrative   Patient is single.    Patient lives with roommates.    Patient on disability    Patient has no children.    Patient has some college      Current outpatient prescriptions:  .  aspirin 81 MG tablet, Take 1 tablet by mouth daily., Disp: , Rfl:  .   atorvastatin (LIPITOR) 40 MG tablet, Take 1 tablet (40 mg total) by mouth daily., Disp: 90 tablet, Rfl: 4 .  Biotin 2500 MCG CAPS, Take by mouth., Disp: , Rfl:  .  Cholecalciferol (VITAMIN D-1000 MAX ST) 1000 UNITS tablet, Take 1 tablet by mouth 2 (two) times daily., Disp: , Rfl:  .  clonazePAM (KLONOPIN) 0.5 MG tablet, take 1 tablet by mouth once daily if needed for anxiety, Disp: 30 tablet, Rfl: 2 .  Cyanocobalamin (B-12 PO), Take by mouth., Disp: , Rfl:  .  cyclobenzaprine (FLEXERIL) 10 MG tablet, Take 1 tablet (10 mg total) by mouth 3 (three) times daily as needed for muscle spasms., Disp: 90 tablet, Rfl: 1 .  estradiol (ESTRACE) 0.5 MG tablet, Take 1 tablet (0.5 mg total) by mouth daily., Disp: 90 tablet, Rfl: 4 .  ferrous sulfate (IRON SUPPLEMENT) 325 (65 FE) MG tablet, Take 1 tablet by mouth daily., Disp: , Rfl:  .  FLUoxetine (PROZAC) 20 MG capsule, take 3 capsules by mouth once daily, Disp: 270 capsule, Rfl: 1 .  fluticasone (FLONASE) 50 MCG/ACT nasal spray, Place 2 sprays into both nostrils as needed. , Disp: , Rfl:  .  loratadine (CLARITIN) 10 MG tablet, Take 10 mg by mouth daily., Disp: , Rfl:  .  Magnesium Oxide 500 MG CAPS, Take 500 mg by mouth 2 (two) times daily. , Disp: , Rfl:  .  Multiple Vitamins-Minerals (MULTIVITAMIN PO), Take by mouth., Disp: , Rfl:  .  Omega-3 Fatty Acids (FISH OIL) 1200 MG CAPS, Take 1,200 mg by mouth 2 (two) times daily. , Disp: , Rfl:  .  omeprazole (PRILOSEC) 40 MG capsule, Take 1 capsule (40 mg total) by mouth daily., Disp: 90 capsule, Rfl: 1 .  oxyCODONE-acetaminophen (PERCOCET) 10-325 MG per tablet, Take 1 tablet by mouth every 6 (six) hours as needed for pain., Disp: 100 tablet, Rfl: 0 .  oxyCODONE-acetaminophen (PERCOCET) 10-325 MG per tablet, Take 1 tablet by mouth every 6 (six) hours as needed for pain., Disp: 100 tablet, Rfl: 0 .  oxyCODONE-acetaminophen (PERCOCET) 10-325 MG per tablet, Take 1 tablet by mouth every 6 (six) hours as needed for  pain., Disp: 100 tablet, Rfl: 0 .  potassium gluconate 595 MG TABS tablet, Take 595 mg by mouth daily., Disp: , Rfl:  .  SUMAtriptan (IMITREX) 100 MG tablet, Take 100 mg by mouth every 2 (two) hours as needed for migraine or headache. May repeat in 2 hours if headache persists or recurs., Disp: , Rfl:  .  zolpidem (AMBIEN) 10 MG tablet, take 1 tablet by mouth as directed IN THE EVENING for  sleep, Disp: 90 tablet, Rfl: 0  No Known Allergies   ROS  Constitutional: Negative for fever or weight change.  Respiratory: Negative for cough and shortness of breath.   Cardiovascular: Negative for chest pain or palpitations.  Gastrointestinal: Negative for abdominal pain, no bowel changes.  Musculoskeletal: Negative for gait problem or joint swelling.  Skin: Negative for rash.  Neurological: Negative for dizziness or headache.  No other specific complaints in a complete review of systems (except as listed in HPI above).  Objective  Filed Vitals:   11/25/14 1509  BP: 116/66  Pulse: 94  Temp: 97.5 F (36.4 C)  TempSrc: Oral  Resp: 16  Height: 5\' 4"  (1.626 m)  Weight: 180 lb 3.2 oz (81.738 kg)  SpO2: 96%    Body mass index is 30.92 kg/(m^2).  Physical Exam  Constitutional: Patient appears well-developed and well-nourished. Obese  No distress.  HEENT: head atraumatic, normocephalic, pupils equal and reactive to light, ears normal exam neck is tender, decrease rom, throat within normal limits Cardiovascular: Normal rate, regular rhythm and normal heart sounds.  No murmur heard. No BLE edema. Pulmonary/Chest: Effort normal and breath sounds normal. No respiratory distress. Abdominal: Soft.  There is no tenderness. Psychiatric: Patient has a normal mood and affect. behavior is normal. Judgment and thought content normal.    PHQ2/9: Depression screen [facility] 2/9 11/25/2014 08/25/2014  Decreased Interest 1 1  Down, Depressed, Hopeless 2 2  PHQ - 2 Score 3 3  Altered sleeping 3 3  Tired,  decreased energy 3 3  Change in appetite 0 0  Feeling bad or failure about yourself  0 0  Trouble concentrating 3 1  Moving slowly or fidgety/restless 0 0  Suicidal thoughts 0 0  PHQ-9 Score 12 10  Difficult doing work/chores Not difficult at all Not difficult at all    Fall Risk: Fall Risk  11/25/2014 08/25/2014  Falls in the past year? Yes Yes  Number falls in past yr: 2 or more 1  Injury with Fall? No No      Functional Status Survey: Is the patient deaf or have difficulty hearing?: Yes (left ear-trouble with some pitches of sound) Does the patient have difficulty seeing, even when wearing glasses/contacts?: Yes (glasses) Does the patient have difficulty concentrating, remembering, or making decisions?: No Does the patient have difficulty walking or climbing stairs?: No Does the patient have difficulty dressing or bathing?: No Does the patient have difficulty doing errands alone such as visiting a doctor's office or shopping?: No    Assessment & Plan  1. Migraine without aura and without status migrainosus, not intractable Advised to not take Imitrex daily , she thinks related to neck problems  2. Needs flu shot  - Flu Vaccine QUAD 36+ mos PF IM (Fluarix & Fluzone Quad PF)  3. Anxiety and depression Continue medication   4. Insomnia, persistent  - zolpidem (AMBIEN) 10 MG tablet; take 1 tablet by mouth as directed IN THE EVENING for sleep  Dispense: 90 tablet; Refill: 0  5. Cervical disc disorder with radiculopathy Keep follow up with neurosurgeon - Dr. Arnoldo Morale  6. Vertigo Likely BPPV - Ambulatory referral to ENT  7. Gastro-esophageal reflux disease without esophagitis  - omeprazole (PRILOSEC) 40 MG capsule; Take 1 capsule (40 mg total) by mouth daily.  Dispense: 90 capsule; Refill: 1  8. Climacteric Discussed risk of breast cancer and cardiovascular risk with Premarin - she has been taking it for 10 years, but she does not want to  quit at this time  9.  Chronic cervical pain Stable, needs to have surgery soon, also takes it for back pain. Pain contract signed and in the old system - cyclobenzaprine (FLEXERIL) 10 MG tablet; Take 1 tablet (10 mg total) by mouth 3 (three) times daily as needed for muscle spasms.  Dispense: 90 tablet; Refill: 1 - oxyCODONE-acetaminophen (PERCOCET) 10-325 MG per tablet; Take 1 tablet by mouth every 6 (six) hours as needed for pain.  Dispense: 100 tablet; Refill: 0 - oxyCODONE-acetaminophen (PERCOCET) 10-325 MG per tablet; Take 1 tablet by mouth every 6 (six) hours as needed for pain.  Dispense: 100 tablet; Refill: 0 - oxyCODONE-acetaminophen (PERCOCET) 10-325 MG per tablet; Take 1 tablet by mouth every 6 (six) hours as needed for pain.  Dispense: 100 tablet; Refill: 0

## 2015-01-13 ENCOUNTER — Other Ambulatory Visit: Payer: Self-pay | Admitting: Family Medicine

## 2015-01-13 NOTE — Telephone Encounter (Signed)
Patient requesting refill. 

## 2015-02-03 ENCOUNTER — Encounter: Payer: Self-pay | Admitting: Family Medicine

## 2015-02-03 ENCOUNTER — Ambulatory Visit (INDEPENDENT_AMBULATORY_CARE_PROVIDER_SITE_OTHER): Payer: Medicare Other | Admitting: Family Medicine

## 2015-02-03 ENCOUNTER — Other Ambulatory Visit: Payer: Self-pay | Admitting: Family Medicine

## 2015-02-03 VITALS — BP 126/68 | HR 89 | Temp 98.3°F | Resp 18 | Ht 64.0 in | Wt 185.7 lb

## 2015-02-03 DIAGNOSIS — M722 Plantar fascial fibromatosis: Secondary | ICD-10-CM | POA: Diagnosis not present

## 2015-02-03 DIAGNOSIS — M5010 Cervical disc disorder with radiculopathy, unspecified cervical region: Secondary | ICD-10-CM

## 2015-02-03 DIAGNOSIS — N951 Menopausal and female climacteric states: Secondary | ICD-10-CM

## 2015-02-03 DIAGNOSIS — F329 Major depressive disorder, single episode, unspecified: Secondary | ICD-10-CM | POA: Diagnosis not present

## 2015-02-03 DIAGNOSIS — K219 Gastro-esophageal reflux disease without esophagitis: Secondary | ICD-10-CM | POA: Diagnosis not present

## 2015-02-03 DIAGNOSIS — G4700 Insomnia, unspecified: Secondary | ICD-10-CM

## 2015-02-03 DIAGNOSIS — G43009 Migraine without aura, not intractable, without status migrainosus: Secondary | ICD-10-CM | POA: Diagnosis not present

## 2015-02-03 DIAGNOSIS — G8929 Other chronic pain: Secondary | ICD-10-CM

## 2015-02-03 DIAGNOSIS — F411 Generalized anxiety disorder: Secondary | ICD-10-CM | POA: Insufficient documentation

## 2015-02-03 DIAGNOSIS — M542 Cervicalgia: Secondary | ICD-10-CM

## 2015-02-03 DIAGNOSIS — E785 Hyperlipidemia, unspecified: Secondary | ICD-10-CM

## 2015-02-03 MED ORDER — OXYCODONE-ACETAMINOPHEN 10-325 MG PO TABS: 1.0000 | ORAL_TABLET | Freq: Four times a day (QID) | ORAL | Status: DC | PRN

## 2015-02-03 MED ORDER — ESTRADIOL 1 MG PO TABS: 1.0000 mg | ORAL_TABLET | Freq: Every day | ORAL | Status: DC

## 2015-02-03 MED ORDER — ZOLPIDEM TARTRATE 10 MG PO TABS: ORAL_TABLET | ORAL | Status: DC

## 2015-02-03 MED ORDER — SUMATRIPTAN SUCCINATE 100 MG PO TABS
100.0000 mg | ORAL_TABLET | ORAL | Status: DC | PRN
Start: 2015-02-03 — End: 2015-02-03

## 2015-02-03 MED ORDER — FLUOXETINE HCL 20 MG PO CAPS: 60.0000 mg | ORAL_CAPSULE | Freq: Every day | ORAL | Status: DC

## 2015-02-03 NOTE — Progress Notes (Signed)
Name: Jessica Burgess   MRN: [id number]    DOB: 1955-08-25   Date:02/03/2015       Progress Note  Subjective  Chief Complaint  Chief Complaint  Patient presents with  . Medication Management    3 month F/U  . Hyperlipidemia  . Insomnia    Depends on pain level-due to cervical and back pain.  Marland Kitchen Hot Flashes    Increasing and would like to up her dose of medication.  . Allergic Rhinitis     Taking Loratadine twice daily due to it being fall and controlling symptoms.  . Anxiety    Well controlled with medication  . Pain    Worsen-cervical pain is going down to hands-numbness/tingling. Patient is going to Neurosurgeon today Dr. Newman Pies and having surgery before the end of the year.  . Gastroesophageal Reflux    Well controlled    HPI  Dyslipidemia: she is taking fish oil, triglycerides still elevated but has improved.   Insomnia: takes Zolpidem, she states while on vacation last week she was sleeping great, but once she got back she is waking up at night, she is busy during the day, but worries at night  Depression Major : taking medication daily, has episodes of feeling down and tired, anhedonia, she states she does better when busy and has a plan, like when she was touching up the paint of the house. No suicidal thoughts.   Hot Flashes: she takes Estrace daily, and states still has hot flashes and night sweats.  She would like to adjust dose to help with her  Symptoms. She is aware of [facility] studies but still wants to go up on medication   AR: symptoms of rhinorrhea and nasal congestion are under control   Cervical spine stenosis and radiculitis: s/p neck surgery times 3 , going back to neurosurgeon today , Dr. Arnoldo Morale and is going to have a fusion before the end of the year. She has tingling and pain on both arms. It causes migraine flares also. Pain starts on her nuchal area and radiates to the frontal area.   GERD: well controlled with medication, no heartburn or  regurgitatoin  Plantar Fascitis: right heel pain going on for past few months, started after she walked for 3 miles. Pain got worse while wearing flip flops, has an insole now  Patient Active Problem List   Diagnosis Date Noted  . Back pain, chronic 08/25/2014  . Insomnia, persistent 08/25/2014  . Chronic cervical pain 08/25/2014  . Major depression, chronic ([place]) 08/25/2014  . Dyslipidemia 08/25/2014  . Gastro-esophageal reflux disease without esophagitis 08/25/2014  . H/O high risk medication treatment 08/25/2014  . Blood glucose elevated 08/25/2014  . Migraine without aura and without status migrainosus, not intractable 08/25/2014  . Climacteric 08/25/2014  . Dysmetabolic syndrome [id number]  . Fungal infection of toenail 08/25/2014  . Obesity (BMI 30.0-34.9) 08/25/2014  . Vitamin D deficiency 08/25/2014  . Engages in travel abroad 08/25/2014  . Cervical spinal stenosis 07/23/2013  . Cervical disc disorder with radiculopathy 04/28/2013    Past Surgical History  Procedure Laterality Date  . Neck surgery      x3  . Back surgery      x2 Lower   . Tonsillectomy    . Abdominal hysterectomy      Family History  Problem Relation Age of Onset  . Depression Mother   . Migraines Mother   . Dementia Father   . Hyperlipidemia Brother   . Breast  cancer Paternal Aunt     75s    Social History   Social History  . Marital Status: Single    Spouse Name: N/A  . Number of Children: 0  . Years of Education: 12+   Occupational History  .    [employer] Disabled     Social History Main Topics  . Smoking status: Former Smoker -- 1.50 packs/day for 20 years    Types: Cigarettes    Start date: 03/19/1979    Quit date: 08/25/1999  . Smokeless tobacco: Never Used  . Alcohol Use: 0.0 oz/week    0 Standard drinks or equivalent per week     Comment: wine occassionally  . Drug Use: No  . Sexual Activity: No   Other Topics Concern  . Not on file   Social History Narrative   Patient  is single.    Patient lives with roommates.    Patient on disability    Patient has no children.    Patient has some college      Current outpatient prescriptions:  .  aspirin 81 MG tablet, Take 1 tablet by mouth daily., Disp: , Rfl:  .  atorvastatin (LIPITOR) 40 MG tablet, Take 1 tablet (40 mg total) by mouth daily., Disp: 90 tablet, Rfl: 4 .  Biotin 2500 MCG CAPS, Take by mouth., Disp: , Rfl:  .  Cholecalciferol (VITAMIN D-1000 MAX ST) 1000 UNITS tablet, Take 1 tablet by mouth 2 (two) times daily., Disp: , Rfl:  .  clonazePAM (KLONOPIN) 0.5 MG tablet, take 1 tablet by mouth once daily if needed for anxiety, Disp: 30 tablet, Rfl: 2 .  Cyanocobalamin (B-12 PO), Take by mouth., Disp: , Rfl:  .  cyclobenzaprine (FLEXERIL) 10 MG tablet, Take 1 tablet (10 mg total) by mouth 3 (three) times daily as needed for muscle spasms., Disp: 90 tablet, Rfl: 1 .  estradiol (ESTRACE) 0.5 MG tablet, Take 1 tablet (0.5 mg total) by mouth daily., Disp: 90 tablet, Rfl: 4 .  ferrous sulfate (IRON SUPPLEMENT) 325 (65 FE) MG tablet, Take 1 tablet by mouth daily., Disp: , Rfl:  .  FLUoxetine (PROZAC) 20 MG capsule, Take 3 capsules (60 mg total) by mouth daily., Disp: 270 capsule, Rfl: 1 .  fluticasone (FLONASE) 50 MCG/ACT nasal spray, Place 2 sprays into both nostrils as needed. , Disp: , Rfl:  .  loratadine (CLARITIN) 10 MG tablet, take 1 tablet by mouth twice a day, Disp: 180 tablet, Rfl: 1 .  Magnesium Oxide 500 MG CAPS, Take 500 mg by mouth 2 (two) times daily. , Disp: , Rfl:  .  Multiple Vitamins-Minerals (MULTIVITAMIN PO), Take by mouth., Disp: , Rfl:  .  Omega-3 Fatty Acids (FISH OIL) 1200 MG CAPS, Take 1,200 mg by mouth 2 (two) times daily. , Disp: , Rfl:  .  omeprazole (PRILOSEC) 40 MG capsule, Take 1 capsule (40 mg total) by mouth daily., Disp: 90 capsule, Rfl: 1 .  oxyCODONE-acetaminophen (PERCOCET) 10-325 MG per tablet, Take 1 tablet by mouth every 6 (six) hours as needed for pain., Disp: 100 tablet,  Rfl: 0 .  oxyCODONE-acetaminophen (PERCOCET) 10-325 MG per tablet, Take 1 tablet by mouth every 6 (six) hours as needed for pain., Disp: 100 tablet, Rfl: 0 .  oxyCODONE-acetaminophen (PERCOCET) 10-325 MG per tablet, Take 1 tablet by mouth every 6 (six) hours as needed for pain., Disp: 100 tablet, Rfl: 0 .  potassium gluconate 595 MG TABS tablet, Take 595 mg by mouth daily., Disp: , Rfl:  .  SUMAtriptan (IMITREX) 100 MG tablet, Take 1 tablet (100 mg total) by mouth every 2 (two) hours as needed for migraine or headache. May repeat in 2 hours if headache persists or recurs., Disp: 10 tablet, Rfl: 2 .  zolpidem (AMBIEN) 10 MG tablet, take 1 tablet by mouth as directed IN THE EVENING for sleep, Disp: 90 tablet, Rfl: 0  No Known Allergies   ROS  Constitutional: Negative for fever or weight change.  Respiratory: Negative for cough and shortness of breath.   Cardiovascular: Negative for chest pain or palpitations.  Gastrointestinal: Negative for abdominal pain, no bowel changes.  Musculoskeletal: Negative for gait problem or joint swelling.  Skin: Negative for rash.  Neurological: Negative for dizziness , positive for headache.  No other specific complaints in a complete review of systems (except as listed in HPI above).  Objective  Filed Vitals:   02/03/15 1028  BP: 126/68  Pulse: 89  Temp: 98.3 F (36.8 C)  TempSrc: Oral  Resp: 18  Height: 5\' 4"  (1.626 m)  Weight: 185 lb 11.2 oz (84.233 kg)  SpO2: 96%    Body mass index is 31.86 kg/(m^2).  Physical Exam  Constitutional: Patient appears well-developed and well-nourished. Obese No distress.  HEENT: head atraumatic, normocephalic, pupils equal and reactive to light, neck is stiff and decrease in rom, spasms on nuchal area,  throat within normal limits Cardiovascular: Normal rate, regular rhythm and normal heart sounds.  No murmur heard. No BLE edema. Pulmonary/Chest: Effort normal and breath sounds normal. No respiratory  distress. Abdominal: Soft.  There is no tenderness. Psychiatric: Patient has a normal mood and affect. behavior is normal. Judgment and thought content normal. Neuro: weak grip on right side ( she is right side dominant )  Skin: has some red bumps all over - intermittent breaking out - advised to take bath with 1/4 cup of bleach once a week, may be a staph infection  Muscular Skeletal: right heel pain, during palpation and pressure  PHQ2/9: Depression screen [facility] 2/9 02/03/2015 11/25/2014 08/25/2014  Decreased Interest 0 1 1  Down, Depressed, Hopeless 0 2 2  PHQ - 2 Score 0 3 3  Altered sleeping - 3 3  Tired, decreased energy - 3 3  Change in appetite - 0 0  Feeling bad or failure about yourself  - 0 0  Trouble concentrating - 3 1  Moving slowly or fidgety/restless - 0 0  Suicidal thoughts - 0 0  PHQ-9 Score - 12 10  Difficult doing work/chores - Not difficult at all Not difficult at all     Fall Risk: Fall Risk  02/03/2015 11/25/2014 08/25/2014  Falls in the past year? No Yes Yes  Number falls in past yr: - 2 or more 1  Injury with Fall? - No No     Functional Status Survey: Is the patient deaf or have difficulty hearing?: No Does the patient have difficulty seeing, even when wearing glasses/contacts?: Yes (glasses) Does the patient have difficulty concentrating, remembering, or making decisions?: No Does the patient have difficulty walking or climbing stairs?: No Does the patient have difficulty dressing or bathing?: No Does the patient have difficulty doing errands alone such as visiting a doctor's office or shopping?: No    Assessment & Plan  1. Chronic cervical pain  Continue follow up with Dr. Arnoldo Morale Oxycodones refills  2. Migraine without aura and without status migrainosus, not intractable  Hopefully episodes will decrease when cervical spine fusion is done - SUMAtriptan (IMITREX) 100  MG tablet; Take 1 tablet (100 mg total) by mouth every 2 (two) hours as needed  for migraine or headache. May repeat in 2 hours if headache persists or recurs.  Dispense: 10 tablet; Refill: 2  3. Dyslipidemia  Continue medication   4. Cervical disc disorder with radiculopathy  See Dr. Arnoldo Morale  5. Major depression, chronic ([facility])  Not in remission, does not want to see Psychiatrist - FLUoxetine (PROZAC) 20 MG capsule; Take 3 capsules (60 mg total) by mouth daily.  Dispense: 270 capsule; Refill: 1  6. Insomnia, persistent  - zolpidem (AMBIEN) 10 MG tablet; take 1 tablet by mouth as directed IN THE EVENING for sleep  Dispense: 90 tablet; Refill: 0  7. Generalized anxiety disorder   8. Plantar fasciitis of right foot  Advised to roll foot on iced bottle of water, insoles, stretching  9. Gastroesophageal reflux disease without esophagitis  Continue medication   10. Climacteric  Go up on Estrace dose

## 2015-02-03 NOTE — Telephone Encounter (Signed)
Patient requesting refill. 

## 2015-02-06 ENCOUNTER — Other Ambulatory Visit (HOSPITAL_COMMUNITY): Payer: Medicare Other

## 2015-02-13 ENCOUNTER — Encounter (HOSPITAL_COMMUNITY): Admission: RE | Payer: Self-pay | Source: Ambulatory Visit

## 2015-02-13 ENCOUNTER — Inpatient Hospital Stay (HOSPITAL_COMMUNITY): Admission: RE | Admit: 2015-02-13 | Payer: Medicare Other | Source: Ambulatory Visit | Admitting: Neurosurgery

## 2015-02-13 SURGERY — ANTERIOR CERVICAL DECOMPRESSION/DISCECTOMY FUSION 2 LEVEL/HARDWARE REMOVAL
Anesthesia: General

## 2015-02-23 ENCOUNTER — Other Ambulatory Visit: Payer: Self-pay | Admitting: Neurosurgery

## 2015-03-08 ENCOUNTER — Other Ambulatory Visit (HOSPITAL_COMMUNITY): Payer: Medicare Other

## 2015-03-16 ENCOUNTER — Inpatient Hospital Stay (HOSPITAL_COMMUNITY): Admission: RE | Admit: 2015-03-16 | Payer: Medicare Other | Source: Ambulatory Visit | Admitting: Neurosurgery

## 2015-03-16 ENCOUNTER — Encounter (HOSPITAL_COMMUNITY): Admission: RE | Payer: Self-pay | Source: Ambulatory Visit

## 2015-03-16 SURGERY — ANTERIOR CERVICAL DECOMPRESSION/DISCECTOMY FUSION 2 LEVELS
Anesthesia: General

## 2015-05-05 ENCOUNTER — Ambulatory Visit (INDEPENDENT_AMBULATORY_CARE_PROVIDER_SITE_OTHER): Payer: PPO | Admitting: Family Medicine

## 2015-05-05 ENCOUNTER — Encounter: Payer: Self-pay | Admitting: Family Medicine

## 2015-05-05 VITALS — BP 132/74 | HR 92 | Temp 98.1°F | Resp 16 | Wt 187.1 lb

## 2015-05-05 DIAGNOSIS — G8929 Other chronic pain: Secondary | ICD-10-CM

## 2015-05-05 DIAGNOSIS — J069 Acute upper respiratory infection, unspecified: Secondary | ICD-10-CM | POA: Diagnosis not present

## 2015-05-05 DIAGNOSIS — M549 Dorsalgia, unspecified: Secondary | ICD-10-CM | POA: Diagnosis not present

## 2015-05-05 DIAGNOSIS — J3089 Other allergic rhinitis: Secondary | ICD-10-CM | POA: Insufficient documentation

## 2015-05-05 DIAGNOSIS — M542 Cervicalgia: Secondary | ICD-10-CM

## 2015-05-05 DIAGNOSIS — G4700 Insomnia, unspecified: Secondary | ICD-10-CM

## 2015-05-05 DIAGNOSIS — R739 Hyperglycemia, unspecified: Secondary | ICD-10-CM

## 2015-05-05 DIAGNOSIS — J40 Bronchitis, not specified as acute or chronic: Secondary | ICD-10-CM

## 2015-05-05 DIAGNOSIS — J309 Allergic rhinitis, unspecified: Secondary | ICD-10-CM

## 2015-05-05 DIAGNOSIS — F411 Generalized anxiety disorder: Secondary | ICD-10-CM

## 2015-05-05 DIAGNOSIS — E785 Hyperlipidemia, unspecified: Secondary | ICD-10-CM

## 2015-05-05 DIAGNOSIS — F329 Major depressive disorder, single episode, unspecified: Secondary | ICD-10-CM | POA: Diagnosis not present

## 2015-05-05 DIAGNOSIS — Z79899 Other long term (current) drug therapy: Secondary | ICD-10-CM | POA: Diagnosis not present

## 2015-05-05 MED ORDER — OXYCODONE-ACETAMINOPHEN 10-325 MG PO TABS: 1.0000 | ORAL_TABLET | Freq: Four times a day (QID) | ORAL | Status: DC | PRN

## 2015-05-05 MED ORDER — ZOLPIDEM TARTRATE 10 MG PO TABS: ORAL_TABLET | ORAL | Status: DC

## 2015-05-05 MED ORDER — FLUTICASONE FUROATE-VILANTEROL 100-25 MCG/INH IN AEPB: 1.0000 | INHALATION_SPRAY | Freq: Every day | RESPIRATORY_TRACT | Status: DC

## 2015-05-05 MED ORDER — LORATADINE 10 MG PO TABS: 10.0000 mg | ORAL_TABLET | Freq: Two times a day (BID) | ORAL | Status: DC

## 2015-05-05 MED ORDER — FLUTICASONE PROPIONATE 50 MCG/ACT NA SUSP
2.0000 | NASAL | Status: DC | PRN
Start: 2015-05-05 — End: 2016-05-06

## 2015-05-05 MED ORDER — CLONAZEPAM 0.5 MG PO TABS: ORAL_TABLET | ORAL | Status: DC

## 2015-05-05 MED ORDER — ALBUTEROL SULFATE (2.5 MG/3ML) 0.083% IN NEBU: 2.5000 mg | INHALATION_SOLUTION | Freq: Four times a day (QID) | RESPIRATORY_TRACT | Status: DC | PRN

## 2015-05-05 MED ORDER — CYCLOBENZAPRINE HCL 10 MG PO TABS: 10.0000 mg | ORAL_TABLET | Freq: Three times a day (TID) | ORAL | Status: DC | PRN

## 2015-05-05 NOTE — Progress Notes (Signed)
Name: Jessica Burgess   MRN: [id number]    DOB: [birth year]   Date:05/05/2015       Progress Note  Subjective  Chief Complaint  Chief Complaint  Patient presents with  . Pain    patient is here for her 61-month  . Medication Refill    patient needs 49-month supply of the following: imitrex, prozac, ambien, prilosec, flexeril, klonopin, percocet, loradatine, lipitor & flonase.  . URI    patient presents with cold sx for about 2-days. sx: nausea, dizziness, chest tightness, ear pressure and a deep cough.    HPI  Dyslipidemia: she is taking fish oil, triglycerides still elevated but has improved.   Insomnia: takes Zolpidem, currently carrying for her 61 yo roommate - and she wakes her up with a whistle in the middle of the night  Depression Major : taking medication daily, she is feeling overwhelmed carrying for her roommate, also had to post-pone her neck surgery and is frustrated, but taking medication  AR: currently has a cold, takes mediations are prescription  Cervical spine stenosis and radiculitis: s/p neck surgery times 3 , going back to neurosurgeon today , Dr. Arnoldo Morale and is going to have a fusion before the end of the year. She has tingling and pain on both arms. It causes migraine flares also. Pain starts on her nuchal area and radiates to the frontal area. She was supposed to have surgery January 2017 but it has been post-pone until possibly July of this year.   URI: she states that since yesterday she has noticed scratchy throat, rhinorrhea, congestion, chest congestion with a dep cough and tight feeling . She has been out of albuterol and would like a refill, she does not like steroids po, because it makes her feel jittery.   Patient Active Problem List   Diagnosis Date Noted  . Perennial allergic rhinitis 05/05/2015  . Generalized anxiety disorder 02/03/2015  . Back pain, chronic 08/25/2014  . Insomnia, persistent 08/25/2014  . Chronic cervical pain 08/25/2014  . Major  depression, chronic ([place]) 08/25/2014  . Dyslipidemia 08/25/2014  . Gastro-esophageal reflux disease without esophagitis 08/25/2014  . H/O high risk medication treatment 08/25/2014  . Blood glucose elevated 08/25/2014  . Migraine without aura and without status migrainosus, not intractable 08/25/2014  . Climacteric 08/25/2014  . Dysmetabolic syndrome [id number]  . Fungal infection of toenail 08/25/2014  . Obesity (BMI 30.0-34.9) 08/25/2014  . Vitamin D deficiency 08/25/2014  . Engages in travel abroad 08/25/2014  . Cervical spinal stenosis 07/23/2013  . Cervical disc disorder with radiculopathy 04/28/2013    Past Surgical History  Procedure Laterality Date  . Neck surgery      x3  . Back surgery      x2 Lower   . Tonsillectomy    . Abdominal hysterectomy      Family History  Problem Relation Age of Onset  . Depression Mother   . Migraines Mother   . Dementia Father   . Hyperlipidemia Brother   . Breast cancer Paternal Aunt     96s    Social History   Social History  . Marital Status: Single    Spouse Name: N/A  . Number of Children: 0  . Years of Education: 12+   Occupational History  .    [employer] Disabled     Social History Main Topics  . Smoking status: Former Smoker -- 1.50 packs/day for 20 years    Types: Cigarettes    Start date: 03/19/1979  Quit date: 08/25/1999  . Smokeless tobacco: Never Used  . Alcohol Use: 0.0 oz/week    0 Standard drinks or equivalent per week     Comment: wine occassionally  . Drug Use: No  . Sexual Activity: No   Other Topics Concern  . Not on file   Social History Narrative   Patient is single.    Patient lives with roommates.    Patient on disability    Patient has no children.    Patient has some college      Current outpatient prescriptions:  .  aspirin 81 MG tablet, Take 1 tablet by mouth daily., Disp: , Rfl:  .  atorvastatin (LIPITOR) 40 MG tablet, Take 1 tablet (40 mg total) by mouth daily., Disp: 90 tablet,  Rfl: 4 .  Biotin 2500 MCG CAPS, Take by mouth., Disp: , Rfl:  .  Cholecalciferol (VITAMIN D-1000 MAX ST) 1000 UNITS tablet, Take 1 tablet by mouth 2 (two) times daily., Disp: , Rfl:  .  clonazePAM (KLONOPIN) 0.5 MG tablet, take 1 tablet by mouth once daily if needed for anxiety, Disp: 30 tablet, Rfl: 2 .  Cyanocobalamin (B-12 PO), Take by mouth., Disp: , Rfl:  .  cyclobenzaprine (FLEXERIL) 10 MG tablet, Take 1 tablet (10 mg total) by mouth 3 (three) times daily as needed for muscle spasms., Disp: 90 tablet, Rfl: 1 .  estradiol (ESTRACE) 1 MG tablet, Take 1 tablet (1 mg total) by mouth daily., Disp: 90 tablet, Rfl: 0 .  ferrous sulfate (IRON SUPPLEMENT) 325 (65 FE) MG tablet, Take 1 tablet by mouth daily., Disp: , Rfl:  .  FLUoxetine (PROZAC) 20 MG capsule, Take 3 capsules (60 mg total) by mouth daily., Disp: 270 capsule, Rfl: 1 .  fluticasone (FLONASE) 50 MCG/ACT nasal spray, Place 2 sprays into both nostrils as needed., Disp: 16 g, Rfl: 5 .  loratadine (CLARITIN) 10 MG tablet, Take 1 tablet (10 mg total) by mouth 2 (two) times daily., Disp: 180 tablet, Rfl: 1 .  Magnesium Oxide 500 MG CAPS, Take 500 mg by mouth 2 (two) times daily. , Disp: , Rfl:  .  Multiple Vitamins-Minerals (MULTIVITAMIN PO), Take by mouth., Disp: , Rfl:  .  Omega-3 Fatty Acids (FISH OIL) 1200 MG CAPS, Take 1,200 mg by mouth 2 (two) times daily. , Disp: , Rfl:  .  omeprazole (PRILOSEC) 40 MG capsule, Take 1 capsule (40 mg total) by mouth daily., Disp: 90 capsule, Rfl: 1 .  oxyCODONE-acetaminophen (PERCOCET) 10-325 MG tablet, Take 1 tablet by mouth every 6 (six) hours as needed for pain., Disp: 100 tablet, Rfl: 0 .  oxyCODONE-acetaminophen (PERCOCET) 10-325 MG tablet, Take 1 tablet by mouth every 6 (six) hours as needed for pain., Disp: 100 tablet, Rfl: 0 .  oxyCODONE-acetaminophen (PERCOCET) 10-325 MG tablet, Take 1 tablet by mouth every 6 (six) hours as needed for pain., Disp: 100 tablet, Rfl: 0 .  potassium gluconate 595 MG  TABS tablet, Take 595 mg by mouth daily., Disp: , Rfl:  .  SUMAtriptan (IMITREX) 100 MG tablet, take 1 tablet by mouth if needed for migraines, Disp: 27 tablet, Rfl: 1 .  zolpidem (AMBIEN) 10 MG tablet, take 1 tablet by mouth as directed IN THE EVENING for sleep, Disp: 90 tablet, Rfl: 0 .  albuterol (PROVENTIL) (2.5 MG/3ML) 0.083% nebulizer solution, Take 3 mLs (2.5 mg total) by nebulization every 6 (six) hours as needed for wheezing or shortness of breath., Disp: 75 mL, Rfl: 2 .  fluticasone furoate-vilanterol (  BREO ELLIPTA) 100-25 MCG/INH AEPB, Inhale 1 puff into the lungs daily., Disp: 60 each, Rfl: 0  No Known Allergies   ROS  Ten systems reviewed and is negative except as mentioned in HPI   Objective  Filed Vitals:   05/05/15 1014  BP: 132/74  Pulse: 92  Temp: 98.1 F (36.7 C)  TempSrc: Oral  Resp: 16  Weight: 187 lb 1.6 oz (84.868 kg)  SpO2: 96%    Body mass index is 32.1 kg/(m^2).  Physical Exam  Constitutional: Patient appears well-developed and well-nourished. Obese No distress.  HEENT: head atraumatic, normocephalic, pupils equal and reactive to light, ears normal TM bilaterally,  neck is stiff and has decrease in rom, throat within normal limits Cardiovascular: Normal rate, regular rhythm and normal heart sounds.  No murmur heard. No BLE edema. Pulmonary/Chest: Effort normal and breath sounds normal. No respiratory distress.  Abdominal: Soft.  There is no tenderness. Psychiatric: Patient has a normal mood and affect. behavior is normal. Judgment and thought content normal.   PHQ2/9: Depression screen [facility] 2/9 05/05/2015 02/03/2015 11/25/2014 08/25/2014  Decreased Interest 0 0 1 1  Down, Depressed, Hopeless 0 0 2 2  PHQ - 2 Score 0 0 3 3  Altered sleeping - - 3 3  Tired, decreased energy - - 3 3  Change in appetite - - 0 0  Feeling bad or failure about yourself  - - 0 0  Trouble concentrating - - 3 1  Moving slowly or fidgety/restless - - 0 0  Suicidal thoughts -  - 0 0  PHQ-9 Score - - 12 10  Difficult doing work/chores - - Not difficult at all Not difficult at all    Fall Risk: Fall Risk  05/05/2015 02/03/2015 11/25/2014 08/25/2014  Falls in the past year? No No Yes Yes  Number falls in past yr: - - 2 or more 1  Injury with Fall? - - No No     Functional Status Survey: Is the patient deaf or have difficulty hearing?: No Does the patient have difficulty seeing, even when wearing glasses/contacts?: Yes (patient stated that she needs to go for an exam) Does the patient have difficulty concentrating, remembering, or making decisions?: No Does the patient have difficulty walking or climbing stairs?: No Does the patient have difficulty dressing or bathing?: No Does the patient have difficulty doing errands alone such as visiting a doctor's office or shopping?: No    Assessment & Plan  1. Chronic cervical pain  - oxyCODONE-acetaminophen (PERCOCET) 10-325 MG tablet; Take 1 tablet by mouth every 6 (six) hours as needed for pain.  Dispense: 100 tablet; Refill: 0 - oxyCODONE-acetaminophen (PERCOCET) 10-325 MG tablet; Take 1 tablet by mouth every 6 (six) hours as needed for pain.  Dispense: 100 tablet; Refill: 0 - oxyCODONE-acetaminophen (PERCOCET) 10-325 MG tablet; Take 1 tablet by mouth every 6 (six) hours as needed for pain.  Dispense: 100 tablet; Refill: 0 - cyclobenzaprine (FLEXERIL) 10 MG tablet; Take 1 tablet (10 mg total) by mouth 3 (three) times daily as needed for muscle spasms.  Dispense: 90 tablet; Refill: 1  2. Major depression, chronic ([place])  A little worse because of circumstances  3. Insomnia, persistent  - zolpidem (AMBIEN) 10 MG tablet; take 1 tablet by mouth as directed IN THE EVENING for sleep  Dispense: 90 tablet; Refill: 0  4. Generalized anxiety disorder  - clonazePAM (KLONOPIN) 0.5 MG tablet; take 1 tablet by mouth once daily if needed for anxiety  Dispense: 30  tablet; Refill: 2  5. Back pain, chronic  stable  6. Upper  respiratory infection  Advised otc Mucinex DM  7. Bronchitis  Discussed likely viral - fluticasone furoate-vilanterol (BREO ELLIPTA) 100-25 MCG/INH AEPB; Inhale 1 puff into the lungs daily.  Dispense: 60 each; Refill: 0 - albuterol (PROVENTIL) (2.5 MG/3ML) 0.083% nebulizer solution; Take 3 mLs (2.5 mg total) by nebulization every 6 (six) hours as needed for wheezing or shortness of breath.  Dispense: 75 mL; Refill: 2  8. Perennial allergic rhinitis  - fluticasone (FLONASE) 50 MCG/ACT nasal spray; Place 2 sprays into both nostrils as needed.  Dispense: 16 g; Refill: 5 - loratadine (CLARITIN) 10 MG tablet; Take 1 tablet (10 mg total) by mouth 2 (two) times daily.  Dispense: 180 tablet; Refill: 1

## 2015-05-09 DIAGNOSIS — Z6834 Body mass index (BMI) 34.0-34.9, adult: Secondary | ICD-10-CM | POA: Diagnosis not present

## 2015-05-09 DIAGNOSIS — M4722 Other spondylosis with radiculopathy, cervical region: Secondary | ICD-10-CM | POA: Diagnosis not present

## 2015-05-09 DIAGNOSIS — M542 Cervicalgia: Secondary | ICD-10-CM | POA: Diagnosis not present

## 2015-05-09 DIAGNOSIS — I10 Essential (primary) hypertension: Secondary | ICD-10-CM | POA: Diagnosis not present

## 2015-05-12 ENCOUNTER — Other Ambulatory Visit: Payer: Self-pay | Admitting: Neurosurgery

## 2015-05-18 ENCOUNTER — Other Ambulatory Visit: Payer: Self-pay | Admitting: Family Medicine

## 2015-05-18 NOTE — Telephone Encounter (Signed)
Patient requesting refill. 

## 2015-05-30 ENCOUNTER — Encounter (HOSPITAL_COMMUNITY)
Admission: RE | Admit: 2015-05-30 | Discharge: 2015-05-30 | Disposition: A | Discharge status: Home / Self Care | Payer: PPO | Source: Ambulatory Visit | Attending: Neurosurgery | Admitting: Neurosurgery

## 2015-05-30 ENCOUNTER — Encounter (HOSPITAL_COMMUNITY): Payer: Self-pay

## 2015-05-30 DIAGNOSIS — M4722 Other spondylosis with radiculopathy, cervical region: Secondary | ICD-10-CM | POA: Diagnosis not present

## 2015-05-30 DIAGNOSIS — Z01812 Encounter for preprocedural laboratory examination: Secondary | ICD-10-CM | POA: Diagnosis not present

## 2015-05-30 HISTORY — DX: Bronchitis, not specified as acute or chronic: J40

## 2015-05-30 HISTORY — DX: Other complications of anesthesia, initial encounter: T88.59XA

## 2015-05-30 HISTORY — DX: Other specified postprocedural states: Z98.890

## 2015-05-30 HISTORY — DX: Unspecified osteoarthritis, unspecified site: M19.90

## 2015-05-30 HISTORY — DX: Nausea with vomiting, unspecified: R11.2

## 2015-05-30 HISTORY — DX: Adverse effect of unspecified anesthetic, initial encounter: T41.45XA

## 2015-05-30 LAB — BASIC METABOLIC PANEL
ANION GAP: 8 (ref 5–15)
BUN: 15 mg/dL (ref 6–20)
CHLORIDE: 103 mmol/L (ref 101–111)
CO2: 30 mmol/L (ref 22–32)
CREATININE: 0.73 mg/dL (ref 0.44–1.00)
Calcium: 9.3 mg/dL (ref 8.9–10.3)
GFR calc non Af Amer: >60 mL/min (ref >60)
Glucose, Bld: 103 mg/dL — ABNORMAL HIGH (ref 65–99)
POTASSIUM: 3.8 mmol/L (ref 3.5–5.1)
Sodium: 141 mmol/L (ref 135–145)

## 2015-05-30 LAB — CBC
HEMATOCRIT: 39.4 % (ref 36.0–46.0)
HEMOGLOBIN: 13.9 g/dL (ref 12.0–15.0)
MCH: 31.4 pg (ref 26.0–34.0)
MCHC: 35.3 g/dL (ref 30.0–36.0)
MCV: 88.9 fL (ref 78.0–100.0)
Platelets: 281 10*3/uL (ref 150–400)
RBC: 4.43 MIL/uL (ref 3.87–5.11)
RDW: 12.1 % (ref 11.5–15.5)
WBC: 7.8 10*3/uL (ref 4.0–10.5)

## 2015-05-30 LAB — SURGICAL PCR SCREEN
MRSA, PCR: NEGATIVE
STAPHYLOCOCCUS AUREUS: NEGATIVE

## 2015-05-30 NOTE — Pre-Procedure Instructions (Addendum)
Jessica Burgess  05/30/2015      [facility], [place] [address] [place] [address] Phone: [phone] Fax: [phone]    Your procedure is scheduled on 06/07/15.  Report to [facility] Admitting at 1000 A.M.  Call this number if you have problems the morning of surgery:  [phone]   Remember:  Do not eat food or drink liquids after midnight.  Take these medicines the morning of surgery with A SIP OF WATER inhaler if needed(bring albuterol with you),oxycodone if needed, clonazepam, prozac, breo inhaler/neb if needed, omeprazole  STOP all herbel meds, nsaids (aleve,naproxen,advil,ibuprofen) 5 days prior to surgery 06/02/15 including aspirin, vitamins(biotin, vit D, vit B-12, magnesium oxide, multi, omega fish oil)   Do not wear jewelry, make-up or nail polish.  Do not wear lotions, powders, or perfumes.  You may wear deodorant.  Do not shave 48 hours prior to surgery.  Men may shave face and neck.  Do not bring valuables to the hospital.  [facility] is not responsible for any belongings or valuables.  Contacts, dentures or bridgework may not be worn into surgery.  Leave your suitcase in the car.  After surgery it may be brought to your room.  For patients admitted to the hospital, discharge time will be determined by your treatment team.  Patients discharged the day of surgery will not be allowed to drive home.   Name and phone number of your driver:   Special instructions:  Special Instructions: [facility] - Preparing for Surgery  Before surgery, you can play an important role.  Because skin is not sterile, your skin needs to be as free of germs as possible.  You can reduce the number of germs on you skin by washing with CHG (chlorahexidine gluconate) soap before surgery.  CHG is an antiseptic cleaner which kills germs and bonds with the skin to continue killing germs even after  washing.  Please DO NOT use if you have an allergy to CHG or antibacterial soaps.  If your skin becomes reddened/irritated stop using the CHG and inform your nurse when you arrive at Short Stay.  Do not shave (including legs and underarms) for at least 48 hours prior to the first CHG shower.  You may shave your face.  Please follow these instructions carefully:   1.  Shower with CHG Soap the night before surgery and the morning of Surgery.  2.  If you choose to wash your hair, wash your hair first as usual with your normal shampoo.  3.  After you shampoo, rinse your hair and body thoroughly to remove the Shampoo.  4.  Use CHG as you would any other liquid soap.  You can apply chg directly  to the skin and wash gently with scrungie or a clean washcloth.  5.  Apply the CHG Soap to your body ONLY FROM THE NECK DOWN.  Do not use on open wounds or open sores.  Avoid contact with your eyes ears, mouth and genitals (private parts).  Wash genitals (private parts)       with your normal soap.  6.  Wash thoroughly, paying special attention to the area where your surgery will be performed.  7.  Thoroughly rinse your body with warm water from the neck down.  8.  DO NOT shower/wash with your normal soap after using and rinsing off the CHG Soap.  9.  Pat yourself dry  with a clean towel.            10.  Wear clean pajamas.            11.  Place clean sheets on your bed the night of your first shower and do not sleep with pets.  Day of Surgery  Do not apply any lotions/deodorants the morning of surgery.  Please wear clean clothes to the hospital/surgery center.  Please read over the following fact sheets that you were given. Pain Booklet, Coughing and Deep Breathing, MRSA Information and Surgical Site Infection Prevention

## 2015-06-06 MED ORDER — CEFAZOLIN SODIUM-DEXTROSE 2-3 GM-% IV SOLR
2.0000 g | INTRAVENOUS | Status: AC
  Administered 2015-06-07: 2 g via INTRAVENOUS
  Filled 2015-06-06: qty 50

## 2015-06-07 ENCOUNTER — Encounter (HOSPITAL_COMMUNITY): Payer: Self-pay | Admitting: General Practice

## 2015-06-07 ENCOUNTER — Encounter (HOSPITAL_COMMUNITY)
Admission: RE | Disposition: A | Discharge status: Home / Self Care | Payer: Self-pay | Source: Ambulatory Visit | Attending: Neurosurgery

## 2015-06-07 ENCOUNTER — Ambulatory Visit (HOSPITAL_COMMUNITY): Payer: PPO

## 2015-06-07 ENCOUNTER — Ambulatory Visit (HOSPITAL_COMMUNITY)
Admission: RE | Admit: 2015-06-07 | Discharge: 2015-06-08 | Disposition: A | Discharge status: Home / Self Care | Payer: PPO | Source: Ambulatory Visit | Attending: Neurosurgery | Admitting: Neurosurgery

## 2015-06-07 ENCOUNTER — Ambulatory Visit (HOSPITAL_COMMUNITY): Payer: PPO | Admitting: Anesthesiology

## 2015-06-07 DIAGNOSIS — E559 Vitamin D deficiency, unspecified: Secondary | ICD-10-CM | POA: Insufficient documentation

## 2015-06-07 DIAGNOSIS — M5031 Other cervical disc degeneration,  high cervical region: Secondary | ICD-10-CM | POA: Diagnosis not present

## 2015-06-07 DIAGNOSIS — Z7951 Long term (current) use of inhaled steroids: Secondary | ICD-10-CM | POA: Insufficient documentation

## 2015-06-07 DIAGNOSIS — Z6833 Body mass index (BMI) 33.0-33.9, adult: Secondary | ICD-10-CM | POA: Insufficient documentation

## 2015-06-07 DIAGNOSIS — Z87891 Personal history of nicotine dependence: Secondary | ICD-10-CM | POA: Diagnosis not present

## 2015-06-07 DIAGNOSIS — Z7982 Long term (current) use of aspirin: Secondary | ICD-10-CM | POA: Insufficient documentation

## 2015-06-07 DIAGNOSIS — Z79890 Hormone replacement therapy (postmenopausal): Secondary | ICD-10-CM | POA: Insufficient documentation

## 2015-06-07 DIAGNOSIS — M1990 Unspecified osteoarthritis, unspecified site: Secondary | ICD-10-CM | POA: Diagnosis not present

## 2015-06-07 DIAGNOSIS — M5001 Cervical disc disorder with myelopathy,  high cervical region: Secondary | ICD-10-CM | POA: Insufficient documentation

## 2015-06-07 DIAGNOSIS — Z79899 Other long term (current) drug therapy: Secondary | ICD-10-CM | POA: Diagnosis not present

## 2015-06-07 DIAGNOSIS — E663 Overweight: Secondary | ICD-10-CM | POA: Diagnosis not present

## 2015-06-07 DIAGNOSIS — M4802 Spinal stenosis, cervical region: Secondary | ICD-10-CM | POA: Diagnosis not present

## 2015-06-07 DIAGNOSIS — M542 Cervicalgia: Secondary | ICD-10-CM

## 2015-06-07 DIAGNOSIS — E785 Hyperlipidemia, unspecified: Secondary | ICD-10-CM | POA: Diagnosis not present

## 2015-06-07 DIAGNOSIS — M5011 Cervical disc disorder with radiculopathy,  high cervical region: Secondary | ICD-10-CM | POA: Insufficient documentation

## 2015-06-07 DIAGNOSIS — M4322 Fusion of spine, cervical region: Secondary | ICD-10-CM | POA: Diagnosis not present

## 2015-06-07 DIAGNOSIS — M4712 Other spondylosis with myelopathy, cervical region: Secondary | ICD-10-CM | POA: Diagnosis not present

## 2015-06-07 DIAGNOSIS — K219 Gastro-esophageal reflux disease without esophagitis: Secondary | ICD-10-CM | POA: Diagnosis not present

## 2015-06-07 DIAGNOSIS — M4722 Other spondylosis with radiculopathy, cervical region: Secondary | ICD-10-CM | POA: Diagnosis not present

## 2015-06-07 HISTORY — PX: ANTERIOR CERVICAL DECOMP/DISCECTOMY FUSION: SHX1161

## 2015-06-07 SURGERY — ANTERIOR CERVICAL DECOMPRESSION/DISCECTOMY FUSION 2 LEVELS
Anesthesia: General

## 2015-06-07 MED ORDER — ROCURONIUM BROMIDE 100 MG/10ML IV SOLN
INTRAVENOUS | Status: DC | PRN
  Administered 2015-06-07: 50 mg via INTRAVENOUS

## 2015-06-07 MED ORDER — PROPOFOL 10 MG/ML IV BOLUS
INTRAVENOUS | Status: AC
  Filled 2015-06-07: qty 20

## 2015-06-07 MED ORDER — GLYCOPYRROLATE 0.2 MG/ML IJ SOLN
INTRAMUSCULAR | Status: DC | PRN
  Administered 2015-06-07: 0.4 mg via INTRAVENOUS

## 2015-06-07 MED ORDER — POTASSIUM GLUCONATE 595 (99 K) MG PO TABS: 595.0000 mg | ORAL_TABLET | Freq: Every day | ORAL | Status: DC

## 2015-06-07 MED ORDER — DEXAMETHASONE SODIUM PHOSPHATE 10 MG/ML IJ SOLN
INTRAMUSCULAR | Status: DC | PRN
  Administered 2015-06-07: 10 mg via INTRAVENOUS

## 2015-06-07 MED ORDER — ONDANSETRON HCL 4 MG/2ML IJ SOLN
4.0000 mg | INTRAMUSCULAR | Status: DC | PRN
Start: 2015-06-07 — End: 2015-06-08

## 2015-06-07 MED ORDER — OXYCODONE HCL 5 MG PO TABS
5.0000 mg | ORAL_TABLET | Freq: Once | ORAL | Status: AC | PRN
  Administered 2015-06-07: 5 mg via ORAL

## 2015-06-07 MED ORDER — BUPIVACAINE-EPINEPHRINE (PF) 0.5% -1:200000 IJ SOLN
INTRAMUSCULAR | Status: DC | PRN
Start: 2015-06-07 — End: 2015-06-07
  Administered 2015-06-07: 10 mL

## 2015-06-07 MED ORDER — SODIUM CHLORIDE 0.9 % IV SOLN
10.0000 mg | INTRAVENOUS | Status: DC | PRN
  Administered 2015-06-07: 25 ug/min via INTRAVENOUS

## 2015-06-07 MED ORDER — OXYCODONE-ACETAMINOPHEN 5-325 MG PO TABS
1.0000 | ORAL_TABLET | ORAL | Status: DC | PRN
  Administered 2015-06-07 – 2015-06-08 (×4): 2 via ORAL
  Filled 2015-06-07 (×4): qty 2

## 2015-06-07 MED ORDER — OXYCODONE HCL 5 MG/5ML PO SOLN: 5.0000 mg | Freq: Once | ORAL | Status: AC | PRN

## 2015-06-07 MED ORDER — ATORVASTATIN CALCIUM 20 MG PO TABS
40.0000 mg | ORAL_TABLET | Freq: Every day | ORAL | Status: DC
  Administered 2015-06-07: 40 mg via ORAL
  Filled 2015-06-07: qty 2

## 2015-06-07 MED ORDER — ALUM & MAG HYDROXIDE-SIMETH 200-200-20 MG/5ML PO SUSP: 30.0000 mL | Freq: Four times a day (QID) | ORAL | Status: DC | PRN

## 2015-06-07 MED ORDER — CEFAZOLIN SODIUM-DEXTROSE 2-4 GM/100ML-% IV SOLN
2.0000 g | Freq: Three times a day (TID) | INTRAVENOUS | Status: AC
  Administered 2015-06-07 – 2015-06-08 (×2): 2 g via INTRAVENOUS
  Filled 2015-06-07 (×3): qty 100

## 2015-06-07 MED ORDER — HYDROMORPHONE HCL 1 MG/ML IJ SOLN
0.2500 mg | INTRAMUSCULAR | Status: DC | PRN
  Administered 2015-06-07 (×4): 0.5 mg via INTRAVENOUS

## 2015-06-07 MED ORDER — MENTHOL 3 MG MT LOZG
1.0000 | LOZENGE | OROMUCOSAL | Status: DC | PRN
  Filled 2015-06-07: qty 9

## 2015-06-07 MED ORDER — ONDANSETRON HCL 4 MG/2ML IJ SOLN: 4.0000 mg | Freq: Once | INTRAMUSCULAR | Status: DC | PRN

## 2015-06-07 MED ORDER — ZOLPIDEM TARTRATE 5 MG PO TABS: 5.0000 mg | ORAL_TABLET | Freq: Every evening | ORAL | Status: DC | PRN

## 2015-06-07 MED ORDER — THROMBIN 5000 UNITS EX SOLR
OROMUCOSAL | Status: DC | PRN
  Administered 2015-06-07: 16:00:00 via TOPICAL

## 2015-06-07 MED ORDER — DOCUSATE SODIUM 100 MG PO CAPS
100.0000 mg | ORAL_CAPSULE | Freq: Two times a day (BID) | ORAL | Status: DC
  Administered 2015-06-07: 100 mg via ORAL
  Filled 2015-06-07: qty 1

## 2015-06-07 MED ORDER — LIDOCAINE HCL (CARDIAC) 20 MG/ML IV SOLN
INTRAVENOUS | Status: DC | PRN
  Administered 2015-06-07: 70 mg via INTRAVENOUS
  Administered 2015-06-07: 30 mg via INTRAVENOUS

## 2015-06-07 MED ORDER — ONDANSETRON HCL 4 MG/2ML IJ SOLN
INTRAMUSCULAR | Status: AC
  Filled 2015-06-07: qty 2

## 2015-06-07 MED ORDER — BACITRACIN ZINC 500 UNIT/GM EX OINT
TOPICAL_OINTMENT | CUTANEOUS | Status: DC | PRN
Start: 2015-06-07 — End: 2015-06-07
  Administered 2015-06-07: 1 via TOPICAL

## 2015-06-07 MED ORDER — ESTRADIOL 1 MG PO TABS
1.0000 mg | ORAL_TABLET | Freq: Every day | ORAL | Status: DC
  Filled 2015-06-07 (×2): qty 1

## 2015-06-07 MED ORDER — ROCURONIUM BROMIDE 50 MG/5ML IV SOLN
INTRAVENOUS | Status: AC
  Filled 2015-06-07: qty 1

## 2015-06-07 MED ORDER — MIDAZOLAM HCL 5 MG/5ML IJ SOLN
INTRAMUSCULAR | Status: DC | PRN
  Administered 2015-06-07: 2 mg via INTRAVENOUS

## 2015-06-07 MED ORDER — SODIUM CHLORIDE 0.9 % IR SOLN
Status: DC | PRN
  Administered 2015-06-07: 14:00:00

## 2015-06-07 MED ORDER — NEOSTIGMINE METHYLSULFATE 10 MG/10ML IV SOLN
INTRAVENOUS | Status: DC | PRN
  Administered 2015-06-07: 3 mg via INTRAVENOUS

## 2015-06-07 MED ORDER — PANTOPRAZOLE SODIUM 40 MG PO TBEC
80.0000 mg | DELAYED_RELEASE_TABLET | Freq: Every day | ORAL | Status: DC
  Administered 2015-06-08: 80 mg via ORAL
  Filled 2015-06-07: qty 2

## 2015-06-07 MED ORDER — OXYCODONE-ACETAMINOPHEN 10-325 MG PO TABS: 1.0000 | ORAL_TABLET | ORAL | Status: DC | PRN

## 2015-06-07 MED ORDER — CYCLOBENZAPRINE HCL 10 MG PO TABS
ORAL_TABLET | ORAL | Status: AC
  Administered 2015-06-07: 10 mg
  Filled 2015-06-07: qty 1

## 2015-06-07 MED ORDER — THROMBIN 5000 UNITS EX SOLR
CUTANEOUS | Status: DC | PRN
  Administered 2015-06-07 (×2): 5000 [IU] via TOPICAL

## 2015-06-07 MED ORDER — FLUTICASONE FUROATE-VILANTEROL 100-25 MCG/INH IN AEPB
1.0000 | INHALATION_SPRAY | Freq: Every day | RESPIRATORY_TRACT | Status: DC
Start: 2015-06-07 — End: 2015-06-07

## 2015-06-07 MED ORDER — CLONAZEPAM 0.5 MG PO TABS: 0.5000 mg | ORAL_TABLET | Freq: Three times a day (TID) | ORAL | Status: DC | PRN

## 2015-06-07 MED ORDER — DEXAMETHASONE 4 MG PO TABS
4.0000 mg | ORAL_TABLET | Freq: Four times a day (QID) | ORAL | Status: AC
  Administered 2015-06-07 – 2015-06-08 (×3): 4 mg via ORAL
  Filled 2015-06-07 (×3): qty 1

## 2015-06-07 MED ORDER — 0.9 % SODIUM CHLORIDE (POUR BTL) OPTIME
TOPICAL | Status: DC | PRN
  Administered 2015-06-07: 1000 mL

## 2015-06-07 MED ORDER — HYDROCODONE-ACETAMINOPHEN 5-325 MG PO TABS: 1.0000 | ORAL_TABLET | ORAL | Status: DC | PRN

## 2015-06-07 MED ORDER — OXYCODONE HCL 5 MG PO TABS
ORAL_TABLET | ORAL | Status: AC
  Filled 2015-06-07: qty 1

## 2015-06-07 MED ORDER — BISACODYL 10 MG RE SUPP: 10.0000 mg | Freq: Every day | RECTAL | Status: DC | PRN

## 2015-06-07 MED ORDER — FLUTICASONE PROPIONATE 50 MCG/ACT NA SUSP
2.0000 | NASAL | Status: DC | PRN
  Filled 2015-06-07: qty 16

## 2015-06-07 MED ORDER — PHENOL 1.4 % MT LIQD: 1.0000 | OROMUCOSAL | Status: DC | PRN

## 2015-06-07 MED ORDER — DEXAMETHASONE SODIUM PHOSPHATE 4 MG/ML IJ SOLN: 4.0000 mg | Freq: Four times a day (QID) | INTRAMUSCULAR | Status: AC

## 2015-06-07 MED ORDER — MORPHINE SULFATE (PF) 2 MG/ML IV SOLN
1.0000 mg | INTRAVENOUS | Status: DC | PRN
  Administered 2015-06-07: 2 mg via INTRAVENOUS
  Filled 2015-06-07: qty 1

## 2015-06-07 MED ORDER — DIAZEPAM 5 MG PO TABS
5.0000 mg | ORAL_TABLET | Freq: Four times a day (QID) | ORAL | Status: DC | PRN
  Administered 2015-06-07 – 2015-06-08 (×2): 5 mg via ORAL
  Filled 2015-06-07 (×2): qty 1

## 2015-06-07 MED ORDER — LACTATED RINGERS IV SOLN: INTRAVENOUS | Status: DC

## 2015-06-07 MED ORDER — NEOSTIGMINE METHYLSULFATE 10 MG/10ML IV SOLN
INTRAVENOUS | Status: AC
  Filled 2015-06-07: qty 1

## 2015-06-07 MED ORDER — FENTANYL CITRATE (PF) 100 MCG/2ML IJ SOLN
INTRAMUSCULAR | Status: DC | PRN
  Administered 2015-06-07 (×5): 50 ug via INTRAVENOUS

## 2015-06-07 MED ORDER — ACETAMINOPHEN 650 MG RE SUPP: 650.0000 mg | RECTAL | Status: DC | PRN

## 2015-06-07 MED ORDER — LORATADINE 10 MG PO TABS
10.0000 mg | ORAL_TABLET | Freq: Two times a day (BID) | ORAL | Status: DC
  Administered 2015-06-07: 10 mg via ORAL
  Filled 2015-06-07: qty 1

## 2015-06-07 MED ORDER — CYCLOBENZAPRINE HCL 10 MG PO TABS: 10.0000 mg | ORAL_TABLET | Freq: Three times a day (TID) | ORAL | Status: DC | PRN

## 2015-06-07 MED ORDER — GLYCOPYRROLATE 0.2 MG/ML IJ SOLN
INTRAMUSCULAR | Status: AC
  Filled 2015-06-07: qty 2

## 2015-06-07 MED ORDER — MIDAZOLAM HCL 2 MG/2ML IJ SOLN
INTRAMUSCULAR | Status: AC
  Filled 2015-06-07: qty 2

## 2015-06-07 MED ORDER — FLUOXETINE HCL 20 MG PO CAPS
60.0000 mg | ORAL_CAPSULE | Freq: Every day | ORAL | Status: DC
  Administered 2015-06-08: 60 mg via ORAL
  Filled 2015-06-07: qty 3

## 2015-06-07 MED ORDER — HYDROMORPHONE HCL 1 MG/ML IJ SOLN
INTRAMUSCULAR | Status: AC
  Filled 2015-06-07: qty 1

## 2015-06-07 MED ORDER — LIDOCAINE HCL (CARDIAC) 20 MG/ML IV SOLN
INTRAVENOUS | Status: AC
  Filled 2015-06-07: qty 5

## 2015-06-07 MED ORDER — FERROUS SULFATE 325 (65 FE) MG PO TABS
325.0000 mg | ORAL_TABLET | Freq: Every day | ORAL | Status: DC
  Administered 2015-06-07: 325 mg via ORAL
  Filled 2015-06-07: qty 1

## 2015-06-07 MED ORDER — HEMOSTATIC AGENTS (NO CHARGE) OPTIME
TOPICAL | Status: DC | PRN
  Administered 2015-06-07: 1 via TOPICAL

## 2015-06-07 MED ORDER — ONDANSETRON HCL 4 MG/2ML IJ SOLN
INTRAMUSCULAR | Status: DC | PRN
  Administered 2015-06-07 (×2): 4 mg via INTRAVENOUS

## 2015-06-07 MED ORDER — LACTATED RINGERS IV SOLN
INTRAVENOUS | Status: DC
  Administered 2015-06-07 (×2): via INTRAVENOUS

## 2015-06-07 MED ORDER — SUMATRIPTAN SUCCINATE 100 MG PO TABS
100.0000 mg | ORAL_TABLET | ORAL | Status: DC | PRN
  Filled 2015-06-07: qty 1

## 2015-06-07 MED ORDER — ACETAMINOPHEN 325 MG PO TABS: 650.0000 mg | ORAL_TABLET | ORAL | Status: DC | PRN

## 2015-06-07 MED ORDER — FENTANYL CITRATE (PF) 250 MCG/5ML IJ SOLN
INTRAMUSCULAR | Status: AC
  Filled 2015-06-07: qty 5

## 2015-06-07 MED ORDER — MIDAZOLAM HCL 2 MG/2ML IJ SOLN
1.0000 mg | Freq: Once | INTRAMUSCULAR | Status: AC
  Administered 2015-06-07: 0.5 mg via INTRAVENOUS

## 2015-06-07 MED ORDER — PROPOFOL 10 MG/ML IV BOLUS
INTRAVENOUS | Status: DC | PRN
  Administered 2015-06-07: 170 mg via INTRAVENOUS

## 2015-06-07 MED ORDER — ALBUTEROL SULFATE (2.5 MG/3ML) 0.083% IN NEBU: 2.5000 mg | INHALATION_SOLUTION | Freq: Four times a day (QID) | RESPIRATORY_TRACT | Status: DC | PRN

## 2015-06-07 SURGICAL SUPPLY — 62 items
APL SKNCLS STERI-STRIP NONHPOA (GAUZE/BANDAGES/DRESSINGS) ×1
BAG DECANTER FOR FLEXI CONT (MISCELLANEOUS) ×3 IMPLANT
BENZOIN TINCTURE PRP APPL 2/3 (GAUZE/BANDAGES/DRESSINGS) ×4 IMPLANT
BIT DRILL NEURO 2X3.1 SFT TUCH (MISCELLANEOUS) ×1 IMPLANT
BLADE SURG 15 STRL LF DISP TIS (BLADE) ×1 IMPLANT
BLADE SURG 15 STRL SS (BLADE) ×3
BLADE ULTRA TIP 2M (BLADE) ×3 IMPLANT
BRUSH SCRUB EZ PLAIN DRY (MISCELLANEOUS) ×3 IMPLANT
BUR BARREL STRAIGHT FLUTE 4.0 (BURR) ×3 IMPLANT
BUR MATCHSTICK NEURO 3.0 LAGG (BURR) ×3 IMPLANT
CANISTER SUCT 3000ML PPV (MISCELLANEOUS) ×3 IMPLANT
CLOSURE WOUND 1/2 X4 (GAUZE/BANDAGES/DRESSINGS) ×1
COVER MAYO STAND STRL (DRAPES) ×3 IMPLANT
DEVICE FUSION VISTA 11X14X8MM (Spacer) IMPLANT
DRAPE LAPAROTOMY 100X72 PEDS (DRAPES) ×3 IMPLANT
DRAPE MICROSCOPE LEICA (MISCELLANEOUS) ×2 IMPLANT
DRAPE POUCH INSTRU U-SHP 10X18 (DRAPES) ×3 IMPLANT
DRAPE PROXIMA HALF (DRAPES) ×2 IMPLANT
DRAPE SURG 17X23 STRL (DRAPES) ×6 IMPLANT
DRILL NEURO 2X3.1 SOFT TOUCH (MISCELLANEOUS) ×3
ELECT REM PT RETURN 9FT ADLT (ELECTROSURGICAL) ×3
ELECTRODE REM PT RTRN 9FT ADLT (ELECTROSURGICAL) ×1 IMPLANT
GAUZE SPONGE 4X4 12PLY STRL (GAUZE/BANDAGES/DRESSINGS) ×3 IMPLANT
GAUZE SPONGE 4X4 16PLY XRAY LF (GAUZE/BANDAGES/DRESSINGS) ×2 IMPLANT
GLOVE BIO SURGEON STRL SZ8 (GLOVE) ×3 IMPLANT
GLOVE BIO SURGEON STRL SZ8.5 (GLOVE) ×3 IMPLANT
GLOVE EXAM NITRILE LRG STRL (GLOVE) IMPLANT
GLOVE EXAM NITRILE MD LF STRL (GLOVE) IMPLANT
GLOVE EXAM NITRILE XL STR (GLOVE) IMPLANT
GLOVE EXAM NITRILE XS STR PU (GLOVE) IMPLANT
GOWN STRL REUS W/ TWL LRG LVL3 (GOWN DISPOSABLE) IMPLANT
GOWN STRL REUS W/ TWL XL LVL3 (GOWN DISPOSABLE) IMPLANT
GOWN STRL REUS W/TWL LRG LVL3 (GOWN DISPOSABLE)
GOWN STRL REUS W/TWL XL LVL3 (GOWN DISPOSABLE)
HEMOSTAT POWDER SURGIFOAM 1G (HEMOSTASIS) ×2 IMPLANT
KIT BASIN OR (CUSTOM PROCEDURE TRAY) ×3 IMPLANT
KIT ROOM TURNOVER OR (KITS) ×3 IMPLANT
MARKER SKIN DUAL TIP RULER LAB (MISCELLANEOUS) ×3 IMPLANT
NDL SPNL 18GX3.5 QUINCKE PK (NEEDLE) ×1 IMPLANT
NEEDLE HYPO 22GX1.5 SAFETY (NEEDLE) ×3 IMPLANT
NEEDLE SPNL 18GX3.5 QUINCKE PK (NEEDLE) ×3 IMPLANT
NS IRRIG 1000ML POUR BTL (IV SOLUTION) ×3 IMPLANT
PACK LAMINECTOMY NEURO (CUSTOM PROCEDURE TRAY) ×3 IMPLANT
PATTIES SURGICAL .5 X.5 (GAUZE/BANDAGES/DRESSINGS) ×2 IMPLANT
PATTIES SURGICAL 1X1 (DISPOSABLE) ×2 IMPLANT
PIN DISTRACTION 14MM (PIN) ×6 IMPLANT
PLATE ANT CERV XTEND 1 LV 14 (Plate) ×2 IMPLANT
PLATE ANT CERV XTEND ELD 1 L14 (Plate) ×2 IMPLANT
PUTTY BIOACTIVE 5CC KINEX (Putty) ×2 IMPLANT
RUBBERBAND STERILE (MISCELLANEOUS) ×4 IMPLANT
SCREW XTD VAR 4.2 SELF TAP 12 (Screw) ×16 IMPLANT
SPONGE INTESTINAL PEANUT (DISPOSABLE) ×8 IMPLANT
SPONGE SURGIFOAM ABS GEL SZ50 (HEMOSTASIS) ×3 IMPLANT
STRIP CLOSURE SKIN 1/2X4 (GAUZE/BANDAGES/DRESSINGS) ×2 IMPLANT
SUT VIC AB 0 CT1 27 (SUTURE) ×3
SUT VIC AB 0 CT1 27XBRD ANTBC (SUTURE) ×1 IMPLANT
SUT VIC AB 3-0 SH 8-18 (SUTURE) ×3 IMPLANT
TAPE CLOTH SURG 4X10 WHT LF (GAUZE/BANDAGES/DRESSINGS) ×2 IMPLANT
TOWEL OR 17X24 6PK STRL BLUE (TOWEL DISPOSABLE) ×3 IMPLANT
TOWEL OR 17X26 10 PK STRL BLUE (TOWEL DISPOSABLE) ×3 IMPLANT
VISTA 11X14X8MM (Spacer) ×6 IMPLANT
WATER STERILE IRR 1000ML POUR (IV SOLUTION) ×3 IMPLANT

## 2015-06-07 NOTE — Anesthesia Procedure Notes (Signed)
Procedure Name: Intubation Date/Time: 06/07/2015 1:10 PM Performed by: Rush Farmer E Pre-anesthesia Checklist: Patient identified, Emergency Drugs available, Suction available, Patient being monitored and Timeout performed Patient Re-evaluated:Patient Re-evaluated prior to inductionOxygen Delivery Method: Circle system utilized Preoxygenation: Pre-oxygenation with 100% oxygen Intubation Type: IV induction Ventilation: Mask ventilation without difficulty Laryngoscope Size: Mac and 3 Grade View: Grade I Tube type: Oral Tube size: 7.0 mm Number of attempts: 1 Airway Equipment and Method: Stylet and LTA kit utilized Placement Confirmation: ETT inserted through vocal cords under direct vision,  positive ETCO2 and breath sounds checked- equal and bilateral Secured at: 20 cm Tube secured with: Tape Dental Injury: Teeth and Oropharynx as per pre-operative assessment

## 2015-06-07 NOTE — Transfer of Care (Signed)
Immediate Anesthesia Transfer of Care Note  Patient: TARAHJI SAMUELSEN  Procedure(s) Performed: Procedure(s) with comments: Cervical three - four and Cervical six- seven anterior cervical decompression with fusion interbody prosthesis plating and bonegraft (N/A) - C34 and C67 anterior cervical decompression with fusion interbody prosthesis plating and bonegraft  Patient Location: PACU  Anesthesia Type:General  Level of Consciousness: awake and sedated  Airway & Oxygen Therapy: Patient Spontanous Breathing and Patient connected to face mask oxygen  Post-op Assessment: Report given to RN, Post -op Vital signs reviewed and stable, Patient moving all extremities and Patient moving all extremities X 4  Post vital signs: Reviewed and stable  Last Vitals:  Filed Vitals:   06/07/15 1026  BP: 125/79  Pulse: 72  Temp: 37.1 C  Resp: 16    Complications: No apparent anesthesia complications

## 2015-06-07 NOTE — H&P (Signed)
Subjective: Jessica Burgess is a 60 year old white female who's had previous anterior and posterior neck surgery by other physicians. She has developed recurrent neck, shoulder and arm pain consistent with a cervical radiculopathy/myelopathy. She has failed medical management and was worked up with a cervical MRI which demonstrated she had spondylosis and stenosis most prominent at C3-4 and C6-7. I discussed situation with the patient and reviewed her imaging studies with her. We have discussed the various treatment options including surgery. She has decided to proceed with a C3-4 and C6-7 anterior cervical discectomy, fusion, and plating.  Past Medical History  Diagnosis Date  . Unspecified disorder of skin and subcutaneous tissue   . Symptomatic menopausal or female climacteric states   . Leukocytosis, unspecified   . Dysmetabolic syndrome X   . Esophageal reflux   . Unspecified vitamin D deficiency   . Dermatophytosis of nail   . Anxiety state, unspecified   . Other and unspecified hyperlipidemia   . Encounter for long-term (current) use of other medications   . Spinal stenosis in cervical region   . Other malaise and fatigue   . Backache, unspecified   . Insomnia, unspecified   . Migraine without aura, without mention of intractable migraine without mention of status migrainosus   . Overweight(278.02)   . Cervicalgia   . Allergic rhinitis, cause unspecified   . Complication of anesthesia   . PONV (postoperative nausea and vomiting)   . Bronchitis     hx of when get sick  . Arthritis     Past Surgical History  Procedure Laterality Date  . Neck surgery      x3  . Back surgery      x2 Lower   . Tonsillectomy    . Abdominal hysterectomy      No Known Allergies  Social History  Substance Use Topics  . Smoking status: Former Smoker -- 1.50 packs/day for 20 years    Types: Cigarettes    Start date: 03/19/1979    Quit date: 08/25/1999  . Smokeless tobacco: Never Used  .  Alcohol Use: 0.0 oz/week    0 Standard drinks or equivalent per week     Comment: wine occassionally    Family History  Problem Relation Age of Onset  . Depression Mother   . Migraines Mother   . Dementia Father   . Hyperlipidemia Brother   . Breast cancer Paternal Aunt     19s   Prior to Admission medications   Medication Sig Start Date End Date Taking? Authorizing Provider  albuterol (PROVENTIL) (2.5 MG/3ML) 0.083% nebulizer solution Take 3 mLs (2.5 mg total) by nebulization every 6 (six) hours as needed for wheezing or shortness of breath. 05/05/15  Yes Steele Sizer, MD  aspirin 81 MG tablet Take 1 tablet by mouth daily.   Yes Historical Provider, MD  atorvastatin (LIPITOR) 40 MG tablet Take 1 tablet (40 mg total) by mouth daily. 08/25/14  Yes Steele Sizer, MD  Biotin 2500 MCG CAPS Take 1 tablet by mouth daily.    Yes Historical Provider, MD  Cholecalciferol (VITAMIN D-1000 MAX ST) 1000 UNITS tablet Take 1 tablet by mouth 2 (two) times daily.   Yes Historical Provider, MD  clonazePAM (KLONOPIN) 0.5 MG tablet take 1 tablet by mouth once daily if needed for anxiety Patient taking differently: Take 0.5 mg by mouth 3 (three) times daily as needed for anxiety.  05/05/15  Yes Steele Sizer, MD  Cyanocobalamin (B-12 PO) Take 1 tablet by mouth daily.  Yes Historical Provider, MD  cyclobenzaprine (FLEXERIL) 10 MG tablet Take 1 tablet (10 mg total) by mouth 3 (three) times daily as needed for muscle spasms. 05/05/15  Yes Steele Sizer, MD  estradiol (ESTRACE) 1 MG tablet Take 1 tablet (1 mg total) by mouth daily. 02/03/15  Yes Steele Sizer, MD  ferrous sulfate (IRON SUPPLEMENT) 325 (65 FE) MG tablet Take 1 tablet by mouth daily.   Yes Historical Provider, MD  FLUoxetine (PROZAC) 20 MG capsule Take 3 capsules (60 mg total) by mouth daily. 02/03/15  Yes Steele Sizer, MD  fluticasone (FLONASE) 50 MCG/ACT nasal spray Place 2 sprays into both nostrils as needed. Patient taking differently:  Place 2 sprays into both nostrils as needed for allergies.  05/05/15  Yes Steele Sizer, MD  fluticasone furoate-vilanterol (BREO ELLIPTA) 100-25 MCG/INH AEPB Inhale 1 puff into the lungs daily. Patient taking differently: Inhale 1 puff into the lungs daily as needed. For shortness of breath 05/05/15  Yes Steele Sizer, MD  loratadine (CLARITIN) 10 MG tablet Take 1 tablet (10 mg total) by mouth 2 (two) times daily. 05/05/15  Yes Steele Sizer, MD  Magnesium Oxide 500 MG CAPS Take 500 mg by mouth 2 (two) times daily.    Yes Historical Provider, MD  Multiple Vitamins-Minerals (MULTIVITAMIN PO) Take 1 tablet by mouth daily.    Yes Historical Provider, MD  Omega-3 Fatty Acids (FISH OIL) 1200 MG CAPS Take 1,200 mg by mouth 2 (two) times daily.    Yes Historical Provider, MD  omeprazole (PRILOSEC) 40 MG capsule take 1 capsule by mouth once daily 05/18/15  Yes Steele Sizer, MD  oxyCODONE-acetaminophen (PERCOCET) 10-325 MG tablet Take 1 tablet by mouth every 6 (six) hours as needed for pain. 05/05/15  Yes Steele Sizer, MD  potassium gluconate 595 MG TABS tablet Take 595 mg by mouth daily.   Yes Historical Provider, MD  SUMAtriptan (IMITREX) 100 MG tablet take 1 tablet by mouth if needed for migraines 02/05/15  Yes Steele Sizer, MD  zolpidem (AMBIEN) 10 MG tablet take 1 tablet by mouth as directed IN THE EVENING for sleep Patient taking differently: Take 10 mg by mouth at bedtime as needed for sleep. take 1 tablet by mouth as directed IN THE EVENING for sleep 05/05/15  Yes Steele Sizer, MD  oxyCODONE-acetaminophen (PERCOCET) 10-325 MG tablet Take 1 tablet by mouth every 6 (six) hours as needed for pain. Patient not taking: Reported on 05/29/2015 05/05/15   Steele Sizer, MD  oxyCODONE-acetaminophen (PERCOCET) 10-325 MG tablet Take 1 tablet by mouth every 6 (six) hours as needed for pain. Patient not taking: Reported on 05/29/2015 05/05/15   Steele Sizer, MD     Review of Systems  Positive ROS: As  above  All other systems have been reviewed and were otherwise negative with the exception of those mentioned in the HPI and as above.  Objective: Vital signs in last 24 hours: Temp:  [98.7 F (37.1 C)] 98.7 F (37.1 C) (03/22 1026) Pulse Rate:  [72] 72 (03/22 1026) Resp:  [16] 16 (03/22 1026) BP: (125)/(79) 125/79 mmHg (03/22 1026) SpO2:  [97 %] 97 % (03/22 1026) Weight:  [86.101 kg (189 lb 13.1 oz)] 86.101 kg (189 lb 13.1 oz) (03/22 1026)  General Appearance: Alert, cooperative, no distress, Head: Normocephalic, without obvious abnormality, atraumatic Eyes: PERRL, conjunctiva/corneas clear, EOM's intact,    Ears: Normal  Throat: Normal  Neck: Supple, symmetrical, trachea midline, no adenopathy; thyroid: No enlargement/tenderness/nodules; no carotid bruit or JVD. The patient's anterior  and posterior cervical incisions are well-healed. He has limited cervical range of motion. Back: Symmetric, no curvature, ROM normal, no CVA tenderness Lungs: Clear to auscultation bilaterally, respirations unlabored Heart: Regular rate and rhythm, no murmur, rub or gallop Abdomen: Soft, non-tender,, no masses, no organomegaly Extremities: Extremities normal, atraumatic, no cyanosis or edema Pulses: 2+ and symmetric all extremities Skin: Skin color, texture, turgor normal, no rashes or lesions  NEUROLOGIC:   Mental status: alert and oriented, no aphasia, good attention span, Fund of knowledge/ memory ok Motor Exam - grossly normal Sensory Exam - grossly normal Reflexes:  Coordination - grossly normal Gait - grossly normal Balance - grossly normal Cranial Nerves: I: smell Not tested  II: visual acuity  OS: Normal  OD: Normal   II: visual fields Full to confrontation  II: pupils Equal, round, reactive to light  III,VII: ptosis None  III,IV,VI: extraocular muscles  Full ROM  V: mastication Normal  V: facial light touch sensation  Normal  V,VII: corneal reflex  Present  VII: facial muscle  function - upper  Normal  VII: facial muscle function - lower Normal  VIII: hearing Not tested  IX: soft palate elevation  Normal  IX,X: gag reflex Present  XI: trapezius strength  5/5  XI: sternocleidomastoid strength 5/5  XI: neck flexion strength  5/5  XII: tongue strength  Normal    Data Review Lab Results  Component Value Date   WBC 7.8 05/30/2015   HGB 13.9 05/30/2015   HCT 39.4 05/30/2015   MCV 88.9 05/30/2015   PLT 281 05/30/2015   Lab Results  Component Value Date   NA 141 05/30/2015   K 3.8 05/30/2015   CL 103 05/30/2015   CO2 30 05/30/2015   BUN 15 05/30/2015   CREATININE 0.73 05/30/2015   GLUCOSE 103* 05/30/2015   No results found for: INR, PROTIME  Assessment/Plan: C3-4 and C6-7 disc degeneration, spondylosis, stenosis, cervicalgia, cervical radicular neuropathy, cervical myelopathy: I have discussed the situation with the patient. I have reviewed her imaging studies with her and pointed out the abnormalities. We have discussed the various treatment options including surgery. I have described the surgical treatment option at C3-4 and C6-7 anterior cervical discectomy, fusion, and plating. I have shown her surgical models. We have discussed the risks, benefits, alternatives, and likelihood of achieving uncles with surgery. I have answered all the patient's questions. She has decided to proceed with surgery.   Ophelia Charter 06/07/2015 12:33 PM

## 2015-06-07 NOTE — Anesthesia Preprocedure Evaluation (Addendum)
Anesthesia Evaluation  Patient identified by MRN, date of birth, ID band Patient awake    Reviewed: Allergy & Precautions, NPO status , Patient's Chart, lab work & pertinent test results  Airway Mallampati: II  TM Distance: >3 FB Neck ROM: Full    Dental  (+) Teeth Intact, Dental Advisory Given   Pulmonary former smoker,    breath sounds clear to auscultation       Cardiovascular  Rhythm:Regular Rate:Normal     Neuro/Psych    GI/Hepatic   Endo/Other    Renal/GU      Musculoskeletal   Abdominal   Peds  Hematology   Anesthesia Other Findings   Reproductive/Obstetrics                            Anesthesia Physical Anesthesia Plan  ASA: III  Anesthesia Plan: General   Post-op Pain Management:    Induction: Intravenous  Airway Management Planned: Oral ETT  Additional Equipment:   Intra-op Plan:   Post-operative Plan: Extubation in OR  Informed Consent: I have reviewed the patients History and Physical, chart, labs and discussed the procedure including the risks, benefits and alternatives for the proposed anesthesia with the patient or authorized representative who has indicated his/her understanding and acceptance.   Dental advisory given  Plan Discussed with: CRNA and Anesthesiologist  Anesthesia Plan Comments:        Anesthesia Quick Evaluation  

## 2015-06-07 NOTE — Anesthesia Postprocedure Evaluation (Signed)
Anesthesia Post Note  Patient: Jessica Burgess  Procedure(s) Performed: Procedure(s) (LRB): Cervical three - four and Cervical six- seven anterior cervical decompression with fusion interbody prosthesis plating and bonegraft (N/A)  Patient location during evaluation: PACU Anesthesia Type: General Level of consciousness: awake and alert Pain management: pain level controlled Vital Signs Assessment: post-procedure vital signs reviewed and stable Respiratory status: spontaneous breathing, nonlabored ventilation, respiratory function stable and patient connected to nasal cannula oxygen Cardiovascular status: blood pressure returned to baseline and stable Postop Assessment: no signs of nausea or vomiting Anesthetic complications: no    Last Vitals:  Filed Vitals:   06/07/15 1745 06/07/15 1751  BP: 144/80 144/80  Pulse: 94 94  Temp:    Resp:  13    Last Pain:  Filed Vitals:   06/07/15 1751  PainSc: Asleep                 Catalina Gravel

## 2015-06-07 NOTE — Progress Notes (Signed)
Subjective:  The patient is somnolent but easily arousable. She looks well. She is in no apparent distress.  Objective: Vital signs in last 24 hours: Temp:  [97.3 F (36.3 C)-98.7 F (37.1 C)] 97.3 F (36.3 C) (03/22 1634) Pulse Rate:  [72-81] 80 (03/22 1636) Resp:  [16-27] 22 (03/22 1636) BP: (125-129)/(78-79) 129/78 mmHg (03/22 1636) SpO2:  [97 %-99 %] 99 % (03/22 1636) Weight:  [86.101 kg (189 lb 13.1 oz)] 86.101 kg (189 lb 13.1 oz) (03/22 1026)  Intake/Output from previous day:   Intake/Output this shift: Total I/O In: 1000 [I.V.:1000] Out: 205 [Urine:125; Blood:80]  Physical exam the patient is somewhat but arousable. She is moving all 4 extremities well. Her dressing is clean and dry. There is no evidence of hematoma or shift.  Lab Results: No results for input(s): WBC, HGB, HCT, PLT in the last 72 hours. BMET No results for input(s): NA, K, CL, CO2, GLUCOSE, BUN, CREATININE, CALCIUM in the last 72 hours.  Studies/Results: Dg Cervical Spine 2-3 Views  06/07/2015  CLINICAL DATA:  ACDF EXAM: CERVICAL SPINE - 2-3 VIEW COMPARISON:  None. FINDINGS: Two intraoperative cross-table lateral x-rays of the cervical spine. Image 1 demonstrates and anterior metallic needle with the tip projecting over the C2-3 disc space. Image 2 demonstrates anterior cervical disc fusion with plate and screw fixation at C3-4. IMPRESSION: Intraoperative localization films as described above. Electronically Signed   By: Kathreen Devoid   On: 06/07/2015 16:34    Assessment/Plan: The patient is doing well.      Xena Propst D 06/07/2015, 4:43 PM

## 2015-06-07 NOTE — Op Note (Signed)
Brief history: The patient is a 60 year old white female who has had prior anterior and posterior neck surgeries by other physicians. She has developed worsening neck and arm pain. She was worked up with a cervical MRI which demonstrated spondylosis, stenosis, etc. at C3-4 and C6-7. I discussed the situation with the patient and reviewed her imaging studies with her. We discussed the various treatment options including surgery. The patient has weighed the risks, benefits, and alternative surgery and decided to proceed with the C3-4 and C6-7 anterior cervical discectomy, fusion, and plating.  Preoperative diagnosis: C3-4 and C6-7 disc degeneration, spondylosis, stenosis, cervicalgia, cervical radiculopathy  Postoperative diagnosis: The same  Procedure: C3-4 and C6-7 Anterior cervical discectomy/decompression; C3-4 and C6-7 interbody arthrodesis with local morcellized autograft bone and Kinnex bone graft extender; insertion of interbody prosthesis at C3-4 and C6-7 (Zimmer peek interbody prosthesis); anterior cervical plating from C3-4 and C6-7 with globus titanium plate  Surgeon: Dr. Earle Gell  Asst.: Dr. Suezanne Jacquet Ditty  Anesthesia: Gen. endotracheal  Estimated blood loss: 100 mL  Drains: None  Complications: None  Description of procedure: The patient was brought to the operating room by the anesthesia team. General endotracheal anesthesia was induced. A roll was placed under the patient's shoulders to keep the neck in the neutral position. The patient's anterior cervical region was then prepared with Betadine scrub and Betadine solution. Sterile drapes were applied.  The area to be incised was then injected with Marcaine with epinephrine solution. I then used a scalpel to make a transverse incision in the patient's left anterior neck, incising through the patient's old surgical scar. I used the Metzenbaum scissors to separate the scar tissue and to divide the platysmal muscle and then to dissect  medial to the sternocleidomastoid muscle, jugular vein, and carotid artery. I carefully dissected down towards the anterior cervical spine identifying the esophagus and retracting it medially. Then using Kitner swabs to clear soft tissue from the anterior cervical spine. We then inserted a bent spinal needle into the upper exposed intervertebral disc space. We then obtained intraoperative radiographs confirm our location.  I then used electrocautery to detach the medial border of the longus colli muscle bilaterally from the C3-4 and C6-7 intervertebral disc spaces. I then inserted the Caspar self-retaining retractor underneath the longus colli muscle bilaterally to provide exposure.  We then incised the intervertebral disc at C3-4. We then performed a partial intervertebral discectomy with a pituitary forceps and the Karlin curettes. I then inserted distraction screws into the vertebral bodies at C3 and C4. We then distracted the interspace. We then used the high-speed drill to decorticate the vertebral endplates at 075-GRM, to drill away the remainder of the intervertebral disc, to drill away some posterior spondylosis, and to thin out the posterior longitudinal ligament. I then incised ligament with the arachnoid knife. We then removed the ligament with a Kerrison punches undercutting the vertebral endplates and decompressing the thecal sac. We then performed foraminotomies about the bilateral C4 nerve roots. This completed the decompression at this level.  We then repeated this procedure in an analogous fashion at C6-7 decompressing the thecal sac and the bilateral C7 nerve roots.  We now turned our to attention to the interbody fusion. We used the trial spacers to determine the appropriate size for the interbody prosthesis. We then pre-filled prosthesis with a combination of local morcellized autograft bone that we obtained during decompression as well as Kinnex bone graft extender. We then inserted the  prosthesis into the distracted interspace at  C3-4 and C6-7. We then removed the distraction screws. There was a good snug fit of the prosthesis in the interspace.  Having completed the fusion we now turned attention to the anterior spinal instrumentation. We used the high-speed drill to drill away some anterior spondylosis at the disc spaces so that the plate lay down flat. We selected the appropriate length titanium anterior cervical plate. We laid it along the anterior aspect of the vertebral bodies from C3-4 and C6-7. We then drilled 12 mm holes at C3, C4, C6, and C7. We then secured the plate to the vertebral bodies by placing two 12 mm self-tapping screws at T3, C4, C6 and C7. We then obtained intraoperative radiograph. The demonstrating good position of the instrumentation. We therefore secured the screws the plate the locking each cam. This completed the instrumentation.  We then obtained hemostasis using bipolar electrocautery. We irrigated the wound out with bacitracin solution. We then removed the retractor. We inspected the esophagus for any damage. There was none apparent. We then reapproximated patient's platysmal muscle with interrupted 3-0 Vicryl suture. We then reapproximated the subcutaneous tissue with interrupted 3-0 Vicryl suture. The skin was reapproximated with Steri-Strips and benzoin. The wound was then covered with bacitracin ointment. A sterile dressing was applied. The drapes were removed. Patient was subsequently extubated by the anesthesia team and transported to the post anesthesia care unit in stable condition. All sponge instrument and needle counts were reportedly correct at the end of this case.

## 2015-06-08 DIAGNOSIS — M4712 Other spondylosis with myelopathy, cervical region: Secondary | ICD-10-CM | POA: Diagnosis not present

## 2015-06-08 MED ORDER — CYCLOBENZAPRINE HCL 10 MG PO TABS: 10.0000 mg | ORAL_TABLET | Freq: Three times a day (TID) | ORAL | Status: DC | PRN

## 2015-06-08 MED ORDER — DOCUSATE SODIUM 100 MG PO CAPS: 100.0000 mg | ORAL_CAPSULE | Freq: Two times a day (BID) | ORAL | Status: DC

## 2015-06-08 MED ORDER — OXYCODONE-ACETAMINOPHEN 10-325 MG PO TABS: 1.0000 | ORAL_TABLET | ORAL | Status: DC | PRN

## 2015-06-08 NOTE — Discharge Summary (Signed)
Physician Discharge Summary  Patient ID: Jessica Burgess MRN: [id number] DOB/AGE: July 12, 1955 60 y.o.  Admit date: 06/07/2015 Discharge date: 06/08/2015  Admission Diagnoses:C3-4 and C6-7 disc degeneration, spondylosis, stenosis, cervicalgia, cervical radiculopathy  Discharge Diagnoses: The same Active Problems:   Cervical spondylosis with radiculopathy   Discharged Condition: good  Hospital Course: I performed a C3-4 and C6-7 anterior cervical discectomy, fusion, and plating on the patient on 06/07/2015. The surgery went well.  The patient's postoperative course was unremarkable. On postoperative day #1 she requested discharge to home. She was given written and oral discharge instructions. All her questions were answered.  Consults: None Significant Diagnostic Studies: None Treatments: C3-4 and C6-7 anterior cervical discectomy, fusion, and plating. Discharge Exam: Blood pressure 116/74, pulse 94, temperature 98.4 F (36.9 C), temperature source Oral, resp. rate 18, height 5\' 3"  (1.6 m), weight 86.101 kg (189 lb 13.1 oz), SpO2 95 %. The patient is alert and pleasant. She looks well. She is in no apparent distress. Her dressing is clean and dry. There is no hematoma or shift. She is moving all 4 extremities well, her strength is grossly normal.  Disposition: Home  Discharge Instructions    Call MD for:  difficulty breathing, headache or visual disturbances    Complete by:  As directed      Call MD for:  extreme fatigue    Complete by:  As directed      Call MD for:  hives    Complete by:  As directed      Call MD for:  persistant dizziness or light-headedness    Complete by:  As directed      Call MD for:  persistant nausea and vomiting    Complete by:  As directed      Call MD for:  redness, tenderness, or signs of infection (pain, swelling, redness, odor or green/yellow discharge around incision site)    Complete by:  As directed      Call MD for:  severe uncontrolled  pain    Complete by:  As directed      Call MD for:  temperature >100.4    Complete by:  As directed      Diet - low sodium heart healthy    Complete by:  As directed      Discharge instructions    Complete by:  As directed   Call [phone] for a followup appointment. Take a stool softener while you are using pain medications.     Driving Restrictions    Complete by:  As directed   Do not drive for 2 weeks.     Increase activity slowly    Complete by:  As directed      Lifting restrictions    Complete by:  As directed   Do not lift more than 5 pounds. No excessive bending or twisting.     May shower / Bathe    Complete by:  As directed   He may shower after the pain she is removed 3 days after surgery. Leave the incision alone.     Remove dressing in 48 hours    Complete by:  As directed   Your stitches are under the scan and will dissolve by themselves. The Steri-Strips will fall off after you take a few showers. Do not rub back or pick at the wound, Leave the wound alone.            Medication List    STOP taking these medications  zolpidem 10 MG tablet  Commonly known as:  AMBIEN      TAKE these medications        albuterol (2.5 MG/3ML) 0.083% nebulizer solution  Commonly known as:  PROVENTIL  Take 3 mLs (2.5 mg total) by nebulization every 6 (six) hours as needed for wheezing or shortness of breath.     aspirin 81 MG tablet  Take 1 tablet by mouth daily.     atorvastatin 40 MG tablet  Commonly known as:  LIPITOR  Take 1 tablet (40 mg total) by mouth daily.     B-12 PO  Take 1 tablet by mouth daily.     Biotin 2500 MCG Caps  Take 1 tablet by mouth daily.     clonazePAM 0.5 MG tablet  Commonly known as:  KLONOPIN  take 1 tablet by mouth once daily if needed for anxiety     cyclobenzaprine 10 MG tablet  Commonly known as:  FLEXERIL  Take 1 tablet (10 mg total) by mouth 3 (three) times daily as needed for muscle spasms.     cyclobenzaprine 10 MG  tablet  Commonly known as:  FLEXERIL  Take 1 tablet (10 mg total) by mouth 3 (three) times daily as needed for muscle spasms.     docusate sodium 100 MG capsule  Commonly known as:  COLACE  Take 1 capsule (100 mg total) by mouth 2 (two) times daily.     estradiol 1 MG tablet  Commonly known as:  ESTRACE  Take 1 tablet (1 mg total) by mouth daily.     Fish Oil 1200 MG Caps  Take 1,200 mg by mouth 2 (two) times daily.     FLUoxetine 20 MG capsule  Commonly known as:  PROZAC  Take 3 capsules (60 mg total) by mouth daily.     fluticasone 50 MCG/ACT nasal spray  Commonly known as:  FLONASE  Place 2 sprays into both nostrils as needed.     fluticasone furoate-vilanterol 100-25 MCG/INH Aepb  Commonly known as:  BREO ELLIPTA  Inhale 1 puff into the lungs daily.     IRON SUPPLEMENT 325 (65 FE) MG tablet  Generic drug:  ferrous sulfate  Take 1 tablet by mouth daily.     loratadine 10 MG tablet  Commonly known as:  CLARITIN  Take 1 tablet (10 mg total) by mouth 2 (two) times daily.     Magnesium Oxide 500 MG Caps  Take 500 mg by mouth 2 (two) times daily.     MULTIVITAMIN PO  Take 1 tablet by mouth daily.     omeprazole 40 MG capsule  Commonly known as:  PRILOSEC  take 1 capsule by mouth once daily     oxyCODONE-acetaminophen 10-325 MG tablet  Commonly known as:  PERCOCET  Take 1 tablet by mouth every 4 (four) hours as needed for pain.     potassium gluconate 595 (99 K) MG Tabs tablet  Take 595 mg by mouth daily.     SUMAtriptan 100 MG tablet  Commonly known as:  IMITREX  take 1 tablet by mouth if needed for migraines     VITAMIN D-1000 MAX ST 1000 units tablet  Generic drug:  Cholecalciferol  Take 1 tablet by mouth 2 (two) times daily.         SignedOphelia Charter 06/08/2015, 9:52 AM

## 2015-06-08 NOTE — Evaluation (Signed)
Physical Therapy Evaluation and Discharge Patient Details Name: Jessica Burgess MRN: [id number] DOB: Jul 27, 1955 Today's Date: 06/08/2015   History of Present Illness  Pt is a 60 y/o female who presents s/p C3-C4, C6-C7 ACDF on 06/07/15.  Clinical Impression  Patient evaluated by Physical Therapy with no further acute PT needs identified. All education has been completed and the patient has no further questions. At the time of PT eval pt was able to perform transfers and ambulation with modified independence. Pt was educated on precautions and brace management. See below for any follow-up Physical Therapy or equipment needs. PT is signing off. Thank you for this referral.     Follow Up Recommendations No PT follow up    Equipment Recommendations  None recommended by PT    Recommendations for Other Services       Precautions / Restrictions Precautions Precautions: Fall;Cervical Precaution Comments: Reviewed precautions sheet Required Braces or Orthoses: Cervical Brace Cervical Brace: Hard collar Restrictions Weight Bearing Restrictions: No      Mobility  Bed Mobility Overal bed mobility: Modified Independent             General bed mobility comments: HOB flat and rails lowered to simulate home environment.   Transfers Overall transfer level: Modified independent Equipment used: None Transfers: Sit to/from Stand           General transfer comment: No assist required. No unsteadiness noted.   Ambulation/Gait Ambulation/Gait assistance: Modified independent (Device/Increase time) Ambulation Distance (Feet): 350 Feet Assistive device: None Gait Pattern/deviations: WFL(Within Functional Limits) Gait velocity: Decreased Gait velocity interpretation: Below normal speed for age/gender General Gait Details: No unsteadiness or LOB noted.   Stairs Stairs: Yes Stairs assistance: Modified independent (Device/Increase time) Stair Management: One rail Right;Alternating  pattern;Forwards Number of Stairs: 11 General stair comments: No unsteadiness noted. No assist required.   Wheelchair Mobility    Modified Rankin (Stroke Patients Only)       Balance Overall balance assessment: No apparent balance deficits (not formally assessed)                                           Pertinent Vitals/Pain Pain Assessment: Faces Faces Pain Scale: Hurts a little bit Pain Location: Incision Pain Descriptors / Indicators: Operative site guarding Pain Intervention(s): Limited activity within patient's tolerance;Monitored during session;Repositioned    Home Living Family/patient expects to be discharged to:: Private residence Living Arrangements: Alone Available Help at Discharge: Friend(s);Available 24 hours/day Type of Home: House Home Access: Ramped entrance     Home Layout: One level Home Equipment: Shower seat - built in      Prior Function Level of Independence: Independent               Hand Dominance   Dominant Hand: Right    Extremity/Trunk Assessment   Upper Extremity Assessment: Overall WFL for tasks assessed           Lower Extremity Assessment: Overall WFL for tasks assessed      Cervical / Trunk Assessment: Other exceptions  Communication   Communication: No difficulties  Cognition Arousal/Alertness: Awake/alert Behavior During Therapy: WFL for tasks assessed/performed Overall Cognitive Status: Within Functional Limits for tasks assessed                      General Comments      Exercises  Assessment/Plan    PT Assessment Patent does not need any further PT services  PT Diagnosis Acute pain   PT Problem List    PT Treatment Interventions     PT Goals (Current goals can be found in the Care Plan section) Acute Rehab PT Goals PT Goal Formulation: All assessment and education complete, DC therapy    Frequency     Barriers to discharge        Co-evaluation                End of Session Equipment Utilized During Treatment: Cervical collar Activity Tolerance: Patient tolerated treatment well Patient left: in chair;with call bell/phone within reach Nurse Communication: Mobility status    Functional Assessment Tool Used: Clinical judgement Functional Limitation: Mobility: Walking and moving around Mobility: Walking and Moving Around Current Status [id number]): At least 1 percent but less than 20 percent impaired, limited or restricted Mobility: Walking and Moving Around Goal Status [id number]): At least 1 percent but less than 20 percent impaired, limited or restricted Mobility: Walking and Moving Around Discharge Status [phone]): At least 1 percent but less than 20 percent impaired, limited or restricted    Time: 0803-0820 PT Time Calculation (min) (ACUTE ONLY): 17 min   Charges:   PT Evaluation $PT Eval Moderate Complexity: 1 Procedure     PT G Codes:   PT G-Codes **NOT FOR INPATIENT CLASS** Functional Assessment Tool Used: Clinical judgement Functional Limitation: Mobility: Walking and moving around Mobility: Walking and Moving Around Current Status [id number]): At least 1 percent but less than 20 percent impaired, limited or restricted Mobility: Walking and Moving Around Goal Status [phone]): At least 1 percent but less than 20 percent impaired, limited or restricted Mobility: Walking and Moving Around Discharge Status [phone]): At least 1 percent but less than 20 percent impaired, limited or restricted    Rolinda Roan 06/08/2015, 9:05 AM   Rolinda Roan, PT, DPT Acute Rehabilitation Services Pager: [phone]

## 2015-06-08 NOTE — Progress Notes (Signed)
Patient alert and oriented, mae's well, voiding adequate amount of urine, swallowing without difficulty, no c/o pain. Patient discharged home with family. Aspen collar in place and patient educated. Script and discharged instructions given to patient. Patient and family stated understanding of d/c instructions given and has an appointment with MD.

## 2015-06-09 ENCOUNTER — Encounter (HOSPITAL_COMMUNITY): Payer: Self-pay | Admitting: Neurosurgery

## 2015-06-14 ENCOUNTER — Encounter (HOSPITAL_COMMUNITY): Payer: Self-pay | Admitting: Certified Registered"

## 2015-06-14 ENCOUNTER — Encounter (HOSPITAL_COMMUNITY): Admission: EM | Payer: Self-pay | Source: Home / Self Care

## 2015-06-14 ENCOUNTER — Emergency Department (HOSPITAL_COMMUNITY): Admission: EM | Admit: 2015-06-14 | Payer: PPO | Source: Home / Self Care | Admitting: Neurosurgery

## 2015-06-14 ENCOUNTER — Observation Stay (HOSPITAL_COMMUNITY)
Admission: RE | Admit: 2015-06-14 | Discharge: 2015-06-15 | Disposition: A | Discharge status: Home / Self Care | Payer: PPO | Source: Ambulatory Visit | Attending: Neurosurgery | Admitting: Neurosurgery

## 2015-06-14 ENCOUNTER — Encounter (HOSPITAL_COMMUNITY): Payer: Self-pay | Admitting: Emergency Medicine

## 2015-06-14 ENCOUNTER — Inpatient Hospital Stay (HOSPITAL_COMMUNITY): Payer: PPO | Admitting: Anesthesiology

## 2015-06-14 ENCOUNTER — Encounter (HOSPITAL_COMMUNITY)
Admission: RE | Disposition: A | Discharge status: Home / Self Care | Payer: Self-pay | Source: Ambulatory Visit | Attending: Neurosurgery

## 2015-06-14 ENCOUNTER — Other Ambulatory Visit: Payer: Self-pay | Admitting: Neurosurgery

## 2015-06-14 DIAGNOSIS — Z87891 Personal history of nicotine dependence: Secondary | ICD-10-CM | POA: Insufficient documentation

## 2015-06-14 DIAGNOSIS — Y828 Other medical devices associated with adverse incidents: Secondary | ICD-10-CM | POA: Diagnosis not present

## 2015-06-14 DIAGNOSIS — K219 Gastro-esophageal reflux disease without esophagitis: Secondary | ICD-10-CM | POA: Diagnosis not present

## 2015-06-14 DIAGNOSIS — M4802 Spinal stenosis, cervical region: Secondary | ICD-10-CM | POA: Diagnosis not present

## 2015-06-14 DIAGNOSIS — M96841 Postprocedural hematoma of a musculoskeletal structure following other procedure: Secondary | ICD-10-CM | POA: Diagnosis not present

## 2015-06-14 DIAGNOSIS — M96840 Postprocedural hematoma of a musculoskeletal structure following a musculoskeletal system procedure: Principal | ICD-10-CM | POA: Insufficient documentation

## 2015-06-14 DIAGNOSIS — M1990 Unspecified osteoarthritis, unspecified site: Secondary | ICD-10-CM | POA: Diagnosis not present

## 2015-06-14 DIAGNOSIS — E8881 Metabolic syndrome: Secondary | ICD-10-CM | POA: Diagnosis not present

## 2015-06-14 DIAGNOSIS — R221 Localized swelling, mass and lump, neck: Secondary | ICD-10-CM | POA: Diagnosis present

## 2015-06-14 DIAGNOSIS — T148XXA Other injury of unspecified body region, initial encounter: Secondary | ICD-10-CM

## 2015-06-14 DIAGNOSIS — Z6833 Body mass index (BMI) 33.0-33.9, adult: Secondary | ICD-10-CM | POA: Diagnosis not present

## 2015-06-14 DIAGNOSIS — M549 Dorsalgia, unspecified: Secondary | ICD-10-CM | POA: Diagnosis not present

## 2015-06-14 DIAGNOSIS — E559 Vitamin D deficiency, unspecified: Secondary | ICD-10-CM | POA: Insufficient documentation

## 2015-06-14 DIAGNOSIS — M96830 Postprocedural hemorrhage and hematoma of a musculoskeletal structure following a musculoskeletal system procedure: Secondary | ICD-10-CM | POA: Diagnosis not present

## 2015-06-14 DIAGNOSIS — Z981 Arthrodesis status: Secondary | ICD-10-CM | POA: Diagnosis not present

## 2015-06-14 DIAGNOSIS — M542 Cervicalgia: Secondary | ICD-10-CM | POA: Diagnosis not present

## 2015-06-14 DIAGNOSIS — E785 Hyperlipidemia, unspecified: Secondary | ICD-10-CM | POA: Diagnosis not present

## 2015-06-14 DIAGNOSIS — Y838 Other surgical procedures as the cause of abnormal reaction of the patient, or of later complication, without mention of misadventure at the time of the procedure: Secondary | ICD-10-CM | POA: Diagnosis not present

## 2015-06-14 HISTORY — PX: EVACUATION OF CERVICAL HEMATOMA: SHX6695

## 2015-06-14 SURGERY — EVACUATION OF CERVICAL HEMATOMA
Anesthesia: General

## 2015-06-14 SURGERY — EVACUATION OF CERVICAL HEMATOMA
Anesthesia: General | Site: Neck

## 2015-06-14 MED ORDER — LIDOCAINE HCL (CARDIAC) 20 MG/ML IV SOLN
INTRAVENOUS | Status: DC | PRN
  Administered 2015-06-14: 100 mg via INTRAVENOUS

## 2015-06-14 MED ORDER — MIDAZOLAM HCL 5 MG/5ML IJ SOLN
INTRAMUSCULAR | Status: DC | PRN
  Administered 2015-06-14 (×2): 2 mg via INTRAVENOUS

## 2015-06-14 MED ORDER — LACTATED RINGERS IV SOLN: INTRAVENOUS | Status: DC

## 2015-06-14 MED ORDER — BISACODYL 10 MG RE SUPP: 10.0000 mg | Freq: Every day | RECTAL | Status: DC | PRN

## 2015-06-14 MED ORDER — ALBUTEROL SULFATE (2.5 MG/3ML) 0.083% IN NEBU: 2.5000 mg | INHALATION_SOLUTION | Freq: Four times a day (QID) | RESPIRATORY_TRACT | Status: DC | PRN

## 2015-06-14 MED ORDER — POTASSIUM GLUCONATE 595 (99 K) MG PO TABS: 595.0000 mg | ORAL_TABLET | Freq: Every day | ORAL | Status: DC

## 2015-06-14 MED ORDER — FENTANYL CITRATE (PF) 250 MCG/5ML IJ SOLN
INTRAMUSCULAR | Status: AC
  Filled 2015-06-14: qty 5

## 2015-06-14 MED ORDER — THROMBIN 5000 UNITS EX SOLR
CUTANEOUS | Status: DC | PRN
  Administered 2015-06-14 (×2): 5000 [IU] via TOPICAL

## 2015-06-14 MED ORDER — PROPOFOL 10 MG/ML IV BOLUS
INTRAVENOUS | Status: AC
  Filled 2015-06-14: qty 20

## 2015-06-14 MED ORDER — PROPOFOL 10 MG/ML IV BOLUS
INTRAVENOUS | Status: DC | PRN
  Administered 2015-06-14: 160 mg via INTRAVENOUS
  Administered 2015-06-14: 40 mg via INTRAVENOUS

## 2015-06-14 MED ORDER — DEXAMETHASONE SODIUM PHOSPHATE 10 MG/ML IJ SOLN
INTRAMUSCULAR | Status: DC | PRN
  Administered 2015-06-14: 20 mg via INTRAVENOUS

## 2015-06-14 MED ORDER — DEXAMETHASONE SODIUM PHOSPHATE 10 MG/ML IJ SOLN
INTRAMUSCULAR | Status: AC
  Filled 2015-06-14: qty 2

## 2015-06-14 MED ORDER — MIDAZOLAM HCL 2 MG/2ML IJ SOLN
INTRAMUSCULAR | Status: AC
  Filled 2015-06-14: qty 2

## 2015-06-14 MED ORDER — SUMATRIPTAN SUCCINATE 100 MG PO TABS
100.0000 mg | ORAL_TABLET | ORAL | Status: DC | PRN
  Filled 2015-06-14: qty 1

## 2015-06-14 MED ORDER — FENTANYL CITRATE (PF) 100 MCG/2ML IJ SOLN
INTRAMUSCULAR | Status: AC
  Administered 2015-06-14: 25 ug via INTRAVENOUS
  Filled 2015-06-14: qty 2

## 2015-06-14 MED ORDER — FENTANYL CITRATE (PF) 100 MCG/2ML IJ SOLN
25.0000 ug | INTRAMUSCULAR | Status: DC | PRN
  Administered 2015-06-14: 50 ug via INTRAVENOUS

## 2015-06-14 MED ORDER — CEFAZOLIN SODIUM-DEXTROSE 2-4 GM/100ML-% IV SOLN
INTRAVENOUS | Status: AC
Start: 2015-06-14 — End: 2015-06-14
  Administered 2015-06-14: 2 g via INTRAVENOUS
  Filled 2015-06-14: qty 100

## 2015-06-14 MED ORDER — LACTATED RINGERS IV SOLN
INTRAVENOUS | Status: DC
  Administered 2015-06-14: 20:00:00 via INTRAVENOUS

## 2015-06-14 MED ORDER — DOCUSATE SODIUM 100 MG PO CAPS
100.0000 mg | ORAL_CAPSULE | Freq: Two times a day (BID) | ORAL | Status: DC
Start: 2015-06-14 — End: 2015-06-15
  Administered 2015-06-14 – 2015-06-15 (×2): 100 mg via ORAL
  Filled 2015-06-14 (×2): qty 1

## 2015-06-14 MED ORDER — OXYCODONE-ACETAMINOPHEN 10-325 MG PO TABS: 1.0000 | ORAL_TABLET | ORAL | Status: DC | PRN

## 2015-06-14 MED ORDER — DEXAMETHASONE SODIUM PHOSPHATE 4 MG/ML IJ SOLN
4.0000 mg | Freq: Four times a day (QID) | INTRAMUSCULAR | Status: AC
  Administered 2015-06-14 – 2015-06-15 (×2): 4 mg via INTRAVENOUS
  Filled 2015-06-14 (×2): qty 1

## 2015-06-14 MED ORDER — FLUTICASONE PROPIONATE 50 MCG/ACT NA SUSP
2.0000 | Freq: Every day | NASAL | Status: DC
  Filled 2015-06-14: qty 16

## 2015-06-14 MED ORDER — ONDANSETRON HCL 4 MG/2ML IJ SOLN
INTRAMUSCULAR | Status: AC
  Filled 2015-06-14: qty 2

## 2015-06-14 MED ORDER — FLUOXETINE HCL 20 MG PO CAPS
60.0000 mg | ORAL_CAPSULE | Freq: Every day | ORAL | Status: DC
  Administered 2015-06-15: 60 mg via ORAL
  Filled 2015-06-14: qty 3

## 2015-06-14 MED ORDER — HEMOSTATIC AGENTS (NO CHARGE) OPTIME
TOPICAL | Status: DC | PRN
  Administered 2015-06-14: 1 via TOPICAL

## 2015-06-14 MED ORDER — CLONAZEPAM 0.5 MG PO TABS: 0.5000 mg | ORAL_TABLET | Freq: Three times a day (TID) | ORAL | Status: DC | PRN

## 2015-06-14 MED ORDER — MORPHINE SULFATE (PF) 2 MG/ML IV SOLN
1.0000 mg | INTRAVENOUS | Status: DC | PRN
  Administered 2015-06-14 – 2015-06-15 (×2): 2 mg via INTRAVENOUS
  Filled 2015-06-14 (×3): qty 1

## 2015-06-14 MED ORDER — FENTANYL CITRATE (PF) 100 MCG/2ML IJ SOLN
INTRAMUSCULAR | Status: DC | PRN
  Administered 2015-06-14: 100 ug via INTRAVENOUS
  Administered 2015-06-14: 50 ug via INTRAVENOUS

## 2015-06-14 MED ORDER — DIAZEPAM 5 MG PO TABS
5.0000 mg | ORAL_TABLET | Freq: Four times a day (QID) | ORAL | Status: DC | PRN
  Administered 2015-06-14: 5 mg via ORAL
  Filled 2015-06-14: qty 1

## 2015-06-14 MED ORDER — SODIUM CHLORIDE 0.9 % IR SOLN
Status: DC | PRN
  Administered 2015-06-14: 17:00:00

## 2015-06-14 MED ORDER — 0.9 % SODIUM CHLORIDE (POUR BTL) OPTIME
TOPICAL | Status: DC | PRN
  Administered 2015-06-14: 1000 mL

## 2015-06-14 MED ORDER — ACETAMINOPHEN 650 MG RE SUPP: 650.0000 mg | RECTAL | Status: DC | PRN

## 2015-06-14 MED ORDER — MENTHOL 3 MG MT LOZG: 1.0000 | LOZENGE | OROMUCOSAL | Status: DC | PRN

## 2015-06-14 MED ORDER — SUCCINYLCHOLINE CHLORIDE 20 MG/ML IJ SOLN
INTRAMUSCULAR | Status: DC | PRN
  Administered 2015-06-14: 100 mg via INTRAVENOUS

## 2015-06-14 MED ORDER — ALUM & MAG HYDROXIDE-SIMETH 200-200-20 MG/5ML PO SUSP: 30.0000 mL | Freq: Four times a day (QID) | ORAL | Status: DC | PRN

## 2015-06-14 MED ORDER — ONDANSETRON HCL 4 MG/2ML IJ SOLN: 4.0000 mg | INTRAMUSCULAR | Status: DC | PRN

## 2015-06-14 MED ORDER — PROCHLORPERAZINE EDISYLATE 5 MG/ML IJ SOLN: 10.0000 mg | Freq: Four times a day (QID) | INTRAMUSCULAR | Status: DC | PRN

## 2015-06-14 MED ORDER — PHENYLEPHRINE 40 MCG/ML (10ML) SYRINGE FOR IV PUSH (FOR BLOOD PRESSURE SUPPORT)
PREFILLED_SYRINGE | INTRAVENOUS | Status: AC
  Filled 2015-06-14: qty 10

## 2015-06-14 MED ORDER — CEFAZOLIN SODIUM-DEXTROSE 2-4 GM/100ML-% IV SOLN
2.0000 g | Freq: Three times a day (TID) | INTRAVENOUS | Status: AC
  Administered 2015-06-15 (×2): 2 g via INTRAVENOUS
  Filled 2015-06-14 (×2): qty 100

## 2015-06-14 MED ORDER — PHENYLEPHRINE 40 MCG/ML (10ML) SYRINGE FOR IV PUSH (FOR BLOOD PRESSURE SUPPORT)
PREFILLED_SYRINGE | INTRAVENOUS | Status: AC
  Filled 2015-06-14: qty 20

## 2015-06-14 MED ORDER — BACITRACIN ZINC 500 UNIT/GM EX OINT
TOPICAL_OINTMENT | CUTANEOUS | Status: DC | PRN
  Administered 2015-06-14: 1 via TOPICAL

## 2015-06-14 MED ORDER — PHENOL 1.4 % MT LIQD: 1.0000 | OROMUCOSAL | Status: DC | PRN

## 2015-06-14 MED ORDER — ONDANSETRON HCL 4 MG/2ML IJ SOLN
INTRAMUSCULAR | Status: DC | PRN
  Administered 2015-06-14: 4 mg via INTRAVENOUS

## 2015-06-14 MED ORDER — LORATADINE 10 MG PO TABS
10.0000 mg | ORAL_TABLET | Freq: Two times a day (BID) | ORAL | Status: DC
  Administered 2015-06-14 – 2015-06-15 (×2): 10 mg via ORAL
  Filled 2015-06-14 (×2): qty 1

## 2015-06-14 MED ORDER — PANTOPRAZOLE SODIUM 40 MG PO TBEC
80.0000 mg | DELAYED_RELEASE_TABLET | Freq: Every day | ORAL | Status: DC
Start: 2015-06-14 — End: 2015-06-15
  Administered 2015-06-14 – 2015-06-15 (×2): 80 mg via ORAL
  Filled 2015-06-14 (×2): qty 2

## 2015-06-14 MED ORDER — FENTANYL CITRATE (PF) 100 MCG/2ML IJ SOLN
25.0000 ug | INTRAMUSCULAR | Status: DC | PRN
  Administered 2015-06-14 (×4): 25 ug via INTRAVENOUS

## 2015-06-14 MED ORDER — ATORVASTATIN CALCIUM 40 MG PO TABS
40.0000 mg | ORAL_TABLET | Freq: Every evening | ORAL | Status: DC
  Administered 2015-06-14: 40 mg via ORAL
  Filled 2015-06-14: qty 1

## 2015-06-14 MED ORDER — PHENYLEPHRINE HCL 10 MG/ML IJ SOLN
INTRAMUSCULAR | Status: DC | PRN
Start: 2015-06-14 — End: 2015-06-14
  Administered 2015-06-14: 160 ug via INTRAVENOUS
  Administered 2015-06-14 (×2): 120 ug via INTRAVENOUS
  Administered 2015-06-14: 80 ug via INTRAVENOUS
  Administered 2015-06-14: 120 ug via INTRAVENOUS
  Administered 2015-06-14: 80 ug via INTRAVENOUS
  Administered 2015-06-14: 120 ug via INTRAVENOUS

## 2015-06-14 MED ORDER — LIDOCAINE HCL (CARDIAC) 20 MG/ML IV SOLN
INTRAVENOUS | Status: AC
  Filled 2015-06-14: qty 5

## 2015-06-14 MED ORDER — HYDROCODONE-ACETAMINOPHEN 5-325 MG PO TABS: 1.0000 | ORAL_TABLET | ORAL | Status: DC | PRN

## 2015-06-14 MED ORDER — ACETAMINOPHEN 325 MG PO TABS: 650.0000 mg | ORAL_TABLET | ORAL | Status: DC | PRN

## 2015-06-14 MED ORDER — ESTRADIOL 1 MG PO TABS
1.0000 mg | ORAL_TABLET | Freq: Every day | ORAL | Status: DC
  Filled 2015-06-14 (×2): qty 1

## 2015-06-14 MED ORDER — LACTATED RINGERS IV SOLN
INTRAVENOUS | Status: DC
  Administered 2015-06-14: 17:00:00 via INTRAVENOUS

## 2015-06-14 MED ORDER — DEXAMETHASONE 4 MG PO TABS
4.0000 mg | ORAL_TABLET | Freq: Four times a day (QID) | ORAL | Status: AC
  Administered 2015-06-15: 4 mg via ORAL
  Filled 2015-06-14: qty 1

## 2015-06-14 MED ORDER — MEPERIDINE HCL 25 MG/ML IJ SOLN: 6.2500 mg | INTRAMUSCULAR | Status: DC | PRN

## 2015-06-14 MED ORDER — OXYCODONE-ACETAMINOPHEN 5-325 MG PO TABS
1.0000 | ORAL_TABLET | ORAL | Status: DC | PRN
  Administered 2015-06-14 – 2015-06-15 (×3): 2 via ORAL
  Filled 2015-06-14 (×4): qty 2

## 2015-06-14 MED ORDER — FLUTICASONE FUROATE-VILANTEROL 100-25 MCG/INH IN AEPB
1.0000 | INHALATION_SPRAY | Freq: Every day | RESPIRATORY_TRACT | Status: DC
  Filled 2015-06-14: qty 28

## 2015-06-14 SURGICAL SUPPLY — 59 items
APL SKNCLS STERI-STRIP NONHPOA (GAUZE/BANDAGES/DRESSINGS) ×1
BAG DECANTER FOR FLEXI CONT (MISCELLANEOUS) ×3 IMPLANT
BENZOIN TINCTURE PRP APPL 2/3 (GAUZE/BANDAGES/DRESSINGS) ×3 IMPLANT
BIT DRILL NEURO 2X3.1 SFT TUCH (MISCELLANEOUS) ×1 IMPLANT
BLADE SURG 15 STRL LF DISP TIS (BLADE) ×1 IMPLANT
BLADE SURG 15 STRL SS (BLADE)
BLADE ULTRA TIP 2M (BLADE) ×1 IMPLANT
BRUSH SCRUB EZ PLAIN DRY (MISCELLANEOUS) ×3 IMPLANT
BUR BARREL STRAIGHT FLUTE 4.0 (BURR) ×1 IMPLANT
BUR MATCHSTICK NEURO 3.0 LAGG (BURR) ×1 IMPLANT
CANISTER SUCT 3000ML PPV (MISCELLANEOUS) ×3 IMPLANT
CLOSURE WOUND 1/2 X4 (GAUZE/BANDAGES/DRESSINGS) ×1
COVER MAYO STAND STRL (DRAPES) ×3 IMPLANT
DRAIN JACKSON PRATT 10MM FLAT (MISCELLANEOUS) ×2 IMPLANT
DRAPE LAPAROTOMY 100X72 PEDS (DRAPES) ×3 IMPLANT
DRAPE MICROSCOPE LEICA (MISCELLANEOUS) IMPLANT
DRAPE POUCH INSTRU U-SHP 10X18 (DRAPES) ×3 IMPLANT
DRAPE SURG 17X23 STRL (DRAPES) ×6 IMPLANT
DRILL NEURO 2X3.1 SOFT TOUCH (MISCELLANEOUS)
ELECT REM PT RETURN 9FT ADLT (ELECTROSURGICAL) ×3
ELECTRODE REM PT RTRN 9FT ADLT (ELECTROSURGICAL) ×1 IMPLANT
EVACUATOR SILICONE 100CC (DRAIN) ×2 IMPLANT
GAUZE SPONGE 4X4 12PLY STRL (GAUZE/BANDAGES/DRESSINGS) ×3 IMPLANT
GAUZE SPONGE 4X4 16PLY XRAY LF (GAUZE/BANDAGES/DRESSINGS) IMPLANT
GLOVE BIO SURGEON STRL SZ7 (GLOVE) ×2 IMPLANT
GLOVE BIO SURGEON STRL SZ8 (GLOVE) ×3 IMPLANT
GLOVE BIO SURGEON STRL SZ8.5 (GLOVE) ×3 IMPLANT
GLOVE BIOGEL PI IND STRL 7.0 (GLOVE) IMPLANT
GLOVE BIOGEL PI IND STRL 7.5 (GLOVE) IMPLANT
GLOVE BIOGEL PI INDICATOR 7.0 (GLOVE) ×2
GLOVE BIOGEL PI INDICATOR 7.5 (GLOVE) ×6
GLOVE EXAM NITRILE LRG STRL (GLOVE) IMPLANT
GLOVE EXAM NITRILE MD LF STRL (GLOVE) IMPLANT
GLOVE EXAM NITRILE XL STR (GLOVE) IMPLANT
GLOVE EXAM NITRILE XS STR PU (GLOVE) IMPLANT
GOWN STRL REUS W/ TWL LRG LVL3 (GOWN DISPOSABLE) IMPLANT
GOWN STRL REUS W/ TWL XL LVL3 (GOWN DISPOSABLE) ×1 IMPLANT
GOWN STRL REUS W/TWL LRG LVL3 (GOWN DISPOSABLE) ×6
GOWN STRL REUS W/TWL XL LVL3 (GOWN DISPOSABLE) ×3
KIT BASIN OR (CUSTOM PROCEDURE TRAY) ×1 IMPLANT
KIT ROOM TURNOVER OR (KITS) ×3 IMPLANT
MARKER SKIN DUAL TIP RULER LAB (MISCELLANEOUS) ×3 IMPLANT
NDL SPNL 18GX3.5 QUINCKE PK (NEEDLE) ×1 IMPLANT
NEEDLE HYPO 22GX1.5 SAFETY (NEEDLE) ×1 IMPLANT
NEEDLE SPNL 18GX3.5 QUINCKE PK (NEEDLE) IMPLANT
NS IRRIG 1000ML POUR BTL (IV SOLUTION) ×3 IMPLANT
PACK LAMINECTOMY NEURO (CUSTOM PROCEDURE TRAY) ×3 IMPLANT
PIN DISTRACTION 14MM (PIN) ×6 IMPLANT
RUBBERBAND STERILE (MISCELLANEOUS) IMPLANT
SPONGE INTESTINAL PEANUT (DISPOSABLE) ×2 IMPLANT
SPONGE SURGIFOAM ABS GEL SZ50 (HEMOSTASIS) ×3 IMPLANT
STRIP CLOSURE SKIN 1/2X4 (GAUZE/BANDAGES/DRESSINGS) ×2 IMPLANT
SUT VIC AB 0 CT1 27 (SUTURE)
SUT VIC AB 0 CT1 27XBRD ANTBC (SUTURE) ×1 IMPLANT
SUT VIC AB 3-0 SH 8-18 (SUTURE) ×3 IMPLANT
TAPE CLOTH SURG 4X10 WHT LF (GAUZE/BANDAGES/DRESSINGS) ×2 IMPLANT
TOWEL OR 17X24 6PK STRL BLUE (TOWEL DISPOSABLE) ×3 IMPLANT
TOWEL OR 17X26 10 PK STRL BLUE (TOWEL DISPOSABLE) ×3 IMPLANT
WATER STERILE IRR 1000ML POUR (IV SOLUTION) ×3 IMPLANT

## 2015-06-14 NOTE — Transfer of Care (Signed)
Immediate Anesthesia Transfer of Care Note  Patient: Jessica Burgess  Procedure(s) Performed: Procedure(s): EVACUATION OF CERVICAL HEMATOMA (N/A)  Patient Location: PACU  Anesthesia Type:General  Level of Consciousness: awake, alert , oriented and patient cooperative  Airway & Oxygen Therapy: Patient Spontanous Breathing and Patient connected to nasal cannula oxygen  Post-op Assessment: Report given to RN and Post -op Vital signs reviewed and stable  Post vital signs: Reviewed and stable  Last Vitals:  Filed Vitals:   06/14/15 1621  BP: 152/84  Pulse: 100  Temp: 36.8 C  Resp: 20    Complications: No apparent anesthesia complications

## 2015-06-14 NOTE — Op Note (Signed)
Brief history: The patient is a 60 year old white female on whom I performed a C3-4 and C6-7 anterior cervical discectomy, fusion, and plating on 06/07/2015. The patient was subsequently discharged. She called today and said she had developed some swelling and difficulty swallowing and breathing. I advised her to come to the ER. She has physical findings consistent with a cervical hematoma. I recommended surgery. The patient weighed the risks, benefits, and alternatives and decided to proceed with surgery.  Preop diagnosis: Cervical hematoma  Postoperative diagnosis: The same  Procedure: Evacuation of cervical hematoma  Surgeon: Dr. Earle Gell  Assistant: None  Anesthesia: Gen. endotracheal  Estimated blood loss: Minimal  Specimens: None  Drains one 10 mm flat Jackson-Pratt drain in the prevertebral space  Complications: None  Description of procedure: The patient was brought to the operating room by the anesthesia team. General endotracheal anesthesia was induced. The patient remained in the supine position. Her neck was kept in the neutral position. Her anterior cervical region was then prepared with Betadine scrub and Betadine solution. Sterile drapes were applied. I then incised the patient's fresh cervical incision with the scalpel. I divided the sutures in the platysma muscle with the Metzenbaum scissors. This released liquefied hematoma under pressure. I then used a hand-held retractors to dissecting deeper and evacuate further the liquefied hematoma. There were some loculations. I didn't see active bleeding. I inspected the esophagus which appeared intact. I then placed a 10 mm flat Jackson-Pratt drain in the prevertebral space and tunneled it out through a separate stab wound. I removed the Weitlanter retractor and reapproximated patient's platysma muscle with interrupted 3-0 Vicryl suture. I reapproximated the subcutaneous tissue with interrupted 3-0 Vicryl suture. I reapproximated  the skin with Steri-Strips and benzoin. The wound was then coated with bacitracin ointment. A sterile dressing was applied. The drapes were removed.  By report all sponge, instrument, and needle counts were correct at the end this case.

## 2015-06-14 NOTE — H&P (Signed)
Subjective: The patient is a 60 year old white female on whom I performed a C3-4 and C6-7 anterior cervical discectomy, fusion, and plating on 06/07/2015. The patient was subsequently discharged and did well but called today and said she was having some swelling in her neck. She said the swelling seemed to gradually worsen over the weekend. She admits to some dyspnea.   Past Medical History  Diagnosis Date  . Unspecified disorder of skin and subcutaneous tissue   . Symptomatic menopausal or female climacteric states   . Leukocytosis, unspecified   . Dysmetabolic syndrome X   . Esophageal reflux   . Unspecified vitamin D deficiency   . Dermatophytosis of nail   . Anxiety state, unspecified   . Other and unspecified hyperlipidemia   . Encounter for long-term (current) use of other medications   . Spinal stenosis in cervical region   . Other malaise and fatigue   . Backache, unspecified   . Insomnia, unspecified   . Migraine without aura, without mention of intractable migraine without mention of status migrainosus   . Overweight(278.02)   . Cervicalgia   . Allergic rhinitis, cause unspecified   . Complication of anesthesia   . PONV (postoperative nausea and vomiting)   . Bronchitis     hx of when get sick  . Arthritis     Past Surgical History  Procedure Laterality Date  . Neck surgery      x3  . Back surgery      x2 Lower   . Tonsillectomy    . Abdominal hysterectomy    . Anterior cervical decomp/discectomy fusion N/A 06/07/2015    Procedure: Cervical three - four and Cervical six- seven anterior cervical decompression with fusion interbody prosthesis plating and bonegraft;  Surgeon: Newman Pies, MD;  Location: [facility];  Service: Neurosurgery;  Laterality: N/A;  C34 and C67 anterior cervical decompression with fusion interbody prosthesis plating and bonegraft    No Known Allergies  Social History  Substance Use Topics  . Smoking status: Former Smoker -- 1.50  packs/day for 20 years    Types: Cigarettes    Start date: 03/19/1979    Quit date: 08/25/1999  . Smokeless tobacco: Never Used  . Alcohol Use: 0.0 oz/week    0 Standard drinks or equivalent per week     Comment: wine occassionally    Family History  Problem Relation Age of Onset  . Depression Mother   . Migraines Mother   . Dementia Father   . Hyperlipidemia Brother   . Breast cancer Paternal Aunt     16s   Prior to Admission medications   Medication Sig Start Date End Date Taking? Authorizing Provider  albuterol (PROVENTIL) (2.5 MG/3ML) 0.083% nebulizer solution Take 3 mLs (2.5 mg total) by nebulization every 6 (six) hours as needed for wheezing or shortness of breath. 05/05/15  Yes Steele Sizer, MD  aspirin 81 MG tablet Take 1 tablet by mouth daily.   Yes Historical Provider, MD  atorvastatin (LIPITOR) 40 MG tablet Take 1 tablet (40 mg total) by mouth daily. 08/25/14  Yes Steele Sizer, MD  Biotin 2500 MCG CAPS Take 1 tablet by mouth daily.    Yes Historical Provider, MD  Cholecalciferol (VITAMIN D-1000 MAX ST) 1000 UNITS tablet Take 1 tablet by mouth 2 (two) times daily.   Yes Historical Provider, MD  clonazePAM (KLONOPIN) 0.5 MG tablet take 1 tablet by mouth once daily if needed for anxiety Patient taking differently: Take 0.5 mg by mouth  3 (three) times daily as needed for anxiety.  05/05/15  Yes Steele Sizer, MD  Cyanocobalamin (B-12 PO) Take 1 tablet by mouth daily.    Yes Historical Provider, MD  cyclobenzaprine (FLEXERIL) 10 MG tablet Take 1 tablet (10 mg total) by mouth 3 (three) times daily as needed for muscle spasms. 05/05/15  Yes Steele Sizer, MD  cyclobenzaprine (FLEXERIL) 10 MG tablet Take 1 tablet (10 mg total) by mouth 3 (three) times daily as needed for muscle spasms. 06/08/15  Yes Newman Pies, MD  docusate sodium (COLACE) 100 MG capsule Take 1 capsule (100 mg total) by mouth 2 (two) times daily. 06/08/15  Yes Newman Pies, MD  estradiol (ESTRACE) 1 MG tablet  Take 1 tablet (1 mg total) by mouth daily. 02/03/15  Yes Steele Sizer, MD  ferrous sulfate (IRON SUPPLEMENT) 325 (65 FE) MG tablet Take 1 tablet by mouth daily.   Yes Historical Provider, MD  FLUoxetine (PROZAC) 20 MG capsule Take 3 capsules (60 mg total) by mouth daily. 02/03/15  Yes Steele Sizer, MD  fluticasone (FLONASE) 50 MCG/ACT nasal spray Place 2 sprays into both nostrils as needed. Patient taking differently: Place 2 sprays into both nostrils as needed for allergies.  05/05/15  Yes Steele Sizer, MD  fluticasone furoate-vilanterol (BREO ELLIPTA) 100-25 MCG/INH AEPB Inhale 1 puff into the lungs daily. Patient taking differently: Inhale 1 puff into the lungs daily as needed. For shortness of breath 05/05/15  Yes Steele Sizer, MD  loratadine (CLARITIN) 10 MG tablet Take 1 tablet (10 mg total) by mouth 2 (two) times daily. 05/05/15  Yes Steele Sizer, MD  Magnesium Oxide 500 MG CAPS Take 500 mg by mouth 2 (two) times daily.    Yes Historical Provider, MD  Multiple Vitamins-Minerals (MULTIVITAMIN PO) Take 1 tablet by mouth daily.    Yes Historical Provider, MD  Omega-3 Fatty Acids (FISH OIL) 1200 MG CAPS Take 1,200 mg by mouth 2 (two) times daily.    Yes Historical Provider, MD  omeprazole (PRILOSEC) 40 MG capsule take 1 capsule by mouth once daily 05/18/15  Yes Steele Sizer, MD  oxyCODONE-acetaminophen (PERCOCET) 10-325 MG tablet Take 1 tablet by mouth every 4 (four) hours as needed for pain. 06/08/15  Yes Newman Pies, MD  potassium gluconate 595 MG TABS tablet Take 595 mg by mouth daily.   Yes Historical Provider, MD  SUMAtriptan (IMITREX) 100 MG tablet take 1 tablet by mouth if needed for migraines 02/05/15  Yes Steele Sizer, MD     Review of Systems  Positive ROS: As above  All other systems have been reviewed and were otherwise negative with the exception of those mentioned in the HPI and as above.  Objective: Vital signs in last 24 hours: Temp:  [98.3 F (36.8 C)-98.7 F  (37.1 C)] 98.3 F (36.8 C) (03/29 1621) Pulse Rate:  [100-103] 100 (03/29 1621) Resp:  [18-20] 20 (03/29 1621) BP: (150-152)/(84-93) 152/84 mmHg (03/29 1621) SpO2:  [97 %-98 %] 97 % (03/29 1621) Weight:  [86.098 kg (189 lb 13 oz)] 86.098 kg (189 lb 13 oz) (03/29 1621)  His exam:  General: An alert and pleasant 60 year old white female in mild distress  HEENT: Normocephalic, atraumatic  Neck: The patient's left anterior cervical incision is healing well. She has swelling and some left to right midline shift. She's not have any dyspnea now.  Neurologic exam: The patient is alert and pleasant. She is moving all 4 extremities well. She is oriented.   Data Review Lab Results  Component Value Date   WBC 7.8 05/30/2015   HGB 13.9 05/30/2015   HCT 39.4 05/30/2015   MCV 88.9 05/30/2015   PLT 281 05/30/2015   Lab Results  Component Value Date   NA 141 05/30/2015   K 3.8 05/30/2015   CL 103 05/30/2015   CO2 30 05/30/2015   BUN 15 05/30/2015   CREATININE 0.73 05/30/2015   GLUCOSE 103* 05/30/2015   No results found for: INR, PROTIME  Assessment/Plan: Cervical hematoma: I have discussed the situation with the patient and her family. We have discussed the various options including admitting her for observation and starting steroids versus an evacuation of her cervical hematoma. I favor the latter. I explained the surgery as well as the risks including infection, injury to the neck structures, wound healing problems, prolonged intubation, etc. I have answered all her questions. She wants to proceed with surgery.   Ophelia Charter 06/14/2015 5:43 PM

## 2015-06-14 NOTE — Progress Notes (Signed)
Subjective:  The patient is somewhat but arousable. She is in no apparent distress. She looks well.  Objective: Vital signs in last 24 hours: Temp:  [98.3 F (36.8 C)-98.7 F (37.1 C)] 98.3 F (36.8 C) (03/29 1621) Pulse Rate:  [100-103] 100 (03/29 1621) Resp:  [18-20] 20 (03/29 1621) BP: (150-152)/(84-93) 152/84 mmHg (03/29 1621) SpO2:  [97 %-98 %] 97 % (03/29 1621) Weight:  [86.098 kg (189 lb 13 oz)] 86.098 kg (189 lb 13 oz) (03/29 1621)  Intake/Output from previous day:   Intake/Output this shift: Total I/O In: -  Out: 10 [Blood:10]  Physical exam the patient is somewhat but arousable. She is moving all 4 extremities well. Her dressing is clean and dry.  Lab Results: No results for input(s): WBC, HGB, HCT, PLT in the last 72 hours. BMET No results for input(s): NA, K, CL, CO2, GLUCOSE, BUN, CREATININE, CALCIUM in the last 72 hours.  Studies/Results: No results found.  Assessment/Plan: The patient is doing well. I spoke with her family.  LOS: 0 days     Nashayla Telleria D 06/14/2015, 6:45 PM

## 2015-06-14 NOTE — Anesthesia Procedure Notes (Signed)
Procedure Name: Intubation Date/Time: 06/14/2015 5:52 PM Performed by: Melina Copa, Sidonia Nutter R Pre-anesthesia Checklist: Patient identified, Emergency Drugs available, Suction available, Patient being monitored and Timeout performed Patient Re-evaluated:Patient Re-evaluated prior to inductionOxygen Delivery Method: Circle system utilized Preoxygenation: Pre-oxygenation with 100% oxygen Intubation Type: IV induction Ventilation: Mask ventilation without difficulty Laryngoscope Size: Glidescope Grade View: Grade I Tube type: Oral Tube size: 7.0 mm Number of attempts: 1 Airway Equipment and Method: Video-laryngoscopy and Rigid stylet Placement Confirmation: ETT inserted through vocal cords under direct vision,  breath sounds checked- equal and bilateral and CO2 detector Secured at: 21 cm Tube secured with: Tape Dental Injury: Teeth and Oropharynx as per pre-operative assessment

## 2015-06-14 NOTE — Progress Notes (Signed)
Pt arrived to 5M12 from PACU.  Pt is alert and oriented.  Family at bedside.  Safety measures in place.  Will continue to monitor.   Fredrich Romans, RN

## 2015-06-14 NOTE — ED Notes (Addendum)
Pt had cervical sx last Thursday and having increased swelling at site since Sunday; no distress noted at present; pt has hoarse voice

## 2015-06-14 NOTE — Anesthesia Postprocedure Evaluation (Signed)
Anesthesia Post Note  Patient: AZAYA MCCOSKEY  Procedure(s) Performed: Procedure(s) (LRB): EVACUATION OF CERVICAL HEMATOMA (N/A)  Patient location during evaluation: PACU Anesthesia Type: General Level of consciousness: awake and alert Pain management: pain level controlled Vital Signs Assessment: post-procedure vital signs reviewed and stable Respiratory status: spontaneous breathing, nonlabored ventilation, respiratory function stable and patient connected to nasal cannula oxygen Cardiovascular status: blood pressure returned to baseline and stable Postop Assessment: no signs of nausea or vomiting Anesthetic complications: no    Last Vitals:  Filed Vitals:   06/14/15 1925 06/14/15 1930  BP:  132/90  Pulse: 102 102  Temp:    Resp: 15 15    Last Pain:  Filed Vitals:   06/14/15 1934  PainSc: 8     LLE Motor Response: Purposeful movement (06/14/15 1930)   RLE Motor Response: Purposeful movement (06/14/15 1930)        Zenaida Deed

## 2015-06-14 NOTE — Anesthesia Preprocedure Evaluation (Addendum)
Anesthesia Evaluation  Patient identified by MRN, date of birth, ID band Patient awake    Reviewed: Allergy & Precautions, NPO status , Patient's Chart, lab work & pertinent test results  History of Anesthesia Complications (+) PONV  Airway Mallampati: II  TM Distance: >3 FB Neck ROM: Limited   Comment: Neck swollen.  No obvious deviation Dental no notable dental hx. (+) Teeth Intact, Dental Advisory Given   Pulmonary former smoker,    Pulmonary exam normal breath sounds clear to auscultation       Cardiovascular negative cardio ROS Normal cardiovascular exam Rhythm:Regular Rate:Normal     Neuro/Psych negative neurological ROS  negative psych ROS   GI/Hepatic negative GI ROS, Neg liver ROS,   Endo/Other  negative endocrine ROS  Renal/GU negative Renal ROS  negative genitourinary   Musculoskeletal negative musculoskeletal ROS (+)   Abdominal   Peds negative pediatric ROS (+)  Hematology negative hematology ROS (+)   Anesthesia Other Findings Easy GA/Airway on 3/22  Inhaler GERD MO  Reproductive/Obstetrics negative OB ROS                            Anesthesia Physical  Anesthesia Plan  ASA: III  Anesthesia Plan: General   Post-op Pain Management:    Induction: Intravenous  Airway Management Planned: Oral ETT and Video Laryngoscope Planned  Additional Equipment:   Intra-op Plan:   Post-operative Plan: Extubation in OR  Informed Consent: I have reviewed the patients History and Physical, chart, labs and discussed the procedure including the risks, benefits and alternatives for the proposed anesthesia with the patient or authorized representative who has indicated his/her understanding and acceptance.   Dental advisory given  Plan Discussed with: CRNA and Anesthesiologist  Anesthesia Plan Comments:         Anesthesia Quick Evaluation

## 2015-06-15 ENCOUNTER — Encounter (HOSPITAL_COMMUNITY): Payer: Self-pay | Admitting: Neurosurgery

## 2015-06-15 DIAGNOSIS — M96840 Postprocedural hematoma of a musculoskeletal structure following a musculoskeletal system procedure: Secondary | ICD-10-CM | POA: Diagnosis not present

## 2015-06-15 NOTE — Care Management Note (Signed)
Case Management Note  Patient Details  Name: TASHYA PATCH MRN: [id number] Date of Birth: 1955-07-29  Subjective/Objective:                    Action/Plan: Patient discharging home with self care. No further needs per CM.   Expected Discharge Date:                  Expected Discharge Plan:  Home/Self Care  In-House Referral:     Discharge planning Services     Post Acute Care Choice:    Choice offered to:     DME Arranged:    DME Agency:     HH Arranged:    [place] Agency:     Status of Service:  Completed, signed off  Medicare Important Message Given:    Date Medicare IM Given:    Medicare IM give by:    Date Additional Medicare IM Given:    Additional Medicare Important Message give by:     If discussed at [place] of Stay Meetings, dates discussed:    Additional Comments:  Pollie Friar, RN 06/15/2015, 10:22 AM

## 2015-06-15 NOTE — Progress Notes (Signed)
Discharge orders received. Pt educated on discharge instructions and spinal precautions. Pt verbalized understanding and given discharge packet. IV removed. Pt given pain meds prior to discharge. Dressing changed. Pt refused wheelchair and was walked downstairs for discharge by family.

## 2015-06-15 NOTE — Progress Notes (Signed)
Pt has removed Aspen Collar twice.  RN educated pt to keep the collar on and helped put it back on.  Will continue to monitor.   Fredrich Romans, RN

## 2015-06-15 NOTE — Discharge Summary (Signed)
Patient ID: Jessica Burgess, female   DOB: 12-Aug-1955, 60 y.o.   MRN: [id number] Physician Discharge Summary  Patient ID: Jessica Burgess MRN: [id number] DOB/AGE: Nov 08, 1955 60 y.o.  Admit date: 06/14/2015 Discharge date: 06/15/2015  Admission Diagnoses: Cervical hematoma, dysphagia  Discharge Diagnoses: The same Active Problems:   Hematoma   Discharged Condition: good  Hospital Course: I evacuated a cervical hematoma from the patient on 06/14/2015. The surgery went well.  The patient's postoperative course was unremarkable. On postoperative day #1 the patient felt well and requested discharge to home. She was given written and oral discharge instructions. All her questions were answered.  Consults: None Significant Diagnostic Studies: None Treatments: Evacuation of cervical hematoma Discharge Exam: Blood pressure 141/68, pulse 98, temperature 97.6 F (36.4 C), temperature source Oral, resp. rate 18, height 5\' 3"  (1.6 m), weight 86.098 kg (189 lb 13 oz), SpO2 97 %. The patient is alert and pleasant. She looks well. Her wound is healing well. There is no hematoma or shift. I removed the drain.  Disposition: Home  Discharge Instructions    Call MD for:  difficulty breathing, headache or visual disturbances    Complete by:  As directed      Call MD for:  extreme fatigue    Complete by:  As directed      Call MD for:  hives    Complete by:  As directed      Call MD for:  persistant dizziness or light-headedness    Complete by:  As directed      Call MD for:  persistant nausea and vomiting    Complete by:  As directed      Call MD for:  redness, tenderness, or signs of infection (pain, swelling, redness, odor or green/yellow discharge around incision site)    Complete by:  As directed      Call MD for:  severe uncontrolled pain    Complete by:  As directed      Call MD for:  temperature >100.4    Complete by:  As directed      Diet - low sodium heart healthy    Complete by:   As directed      Discharge instructions    Complete by:  As directed   Call [phone] for a followup appointment. Take a stool softener while you are using pain medications.     Driving Restrictions    Complete by:  As directed   Do not drive for 2 weeks.     Increase activity slowly    Complete by:  As directed      Lifting restrictions    Complete by:  As directed   Do not lift more than 5 pounds. No excessive bending or twisting.     May shower / Bathe    Complete by:  As directed   He may shower after the pain she is removed 3 days after surgery. Leave the incision alone.     Remove dressing in 48 hours    Complete by:  As directed   Your stitches are under the scan and will dissolve by themselves. The Steri-Strips will fall off after you take a few showers. Do not rub back or pick at the wound, Leave the wound alone.            Medication List    STOP taking these medications        aspirin 81 MG tablet     Fish  Oil 1200 MG Caps      TAKE these medications        albuterol (2.5 MG/3ML) 0.083% nebulizer solution  Commonly known as:  PROVENTIL  Take 3 mLs (2.5 mg total) by nebulization every 6 (six) hours as needed for wheezing or shortness of breath.     atorvastatin 40 MG tablet  Commonly known as:  LIPITOR  Take 1 tablet (40 mg total) by mouth daily.     B-12 PO  Take 1 tablet by mouth daily.     Biotin 2500 MCG Caps  Take 1 tablet by mouth daily.     clonazePAM 0.5 MG tablet  Commonly known as:  KLONOPIN  take 1 tablet by mouth once daily if needed for anxiety     cyclobenzaprine 10 MG tablet  Commonly known as:  FLEXERIL  Take 1 tablet (10 mg total) by mouth 3 (three) times daily as needed for muscle spasms.     cyclobenzaprine 10 MG tablet  Commonly known as:  FLEXERIL  Take 1 tablet (10 mg total) by mouth 3 (three) times daily as needed for muscle spasms.     docusate sodium 100 MG capsule  Commonly known as:  COLACE  Take 1 capsule (100 mg  total) by mouth 2 (two) times daily.     estradiol 1 MG tablet  Commonly known as:  ESTRACE  Take 1 tablet (1 mg total) by mouth daily.     FLUoxetine 20 MG capsule  Commonly known as:  PROZAC  Take 3 capsules (60 mg total) by mouth daily.     fluticasone 50 MCG/ACT nasal spray  Commonly known as:  FLONASE  Place 2 sprays into both nostrils as needed.     fluticasone furoate-vilanterol 100-25 MCG/INH Aepb  Commonly known as:  BREO ELLIPTA  Inhale 1 puff into the lungs daily.     IRON SUPPLEMENT 325 (65 FE) MG tablet  Generic drug:  ferrous sulfate  Take 1 tablet by mouth daily.     loratadine 10 MG tablet  Commonly known as:  CLARITIN  Take 1 tablet (10 mg total) by mouth 2 (two) times daily.     Magnesium Oxide 500 MG Caps  Take 500 mg by mouth 2 (two) times daily.     MULTIVITAMIN PO  Take 1 tablet by mouth daily.     omeprazole 40 MG capsule  Commonly known as:  PRILOSEC  take 1 capsule by mouth once daily     oxyCODONE-acetaminophen 10-325 MG tablet  Commonly known as:  PERCOCET  Take 1 tablet by mouth every 4 (four) hours as needed for pain.     potassium gluconate 595 (99 K) MG Tabs tablet  Take 595 mg by mouth daily.     SUMAtriptan 100 MG tablet  Commonly known as:  IMITREX  take 1 tablet by mouth if needed for migraines     VITAMIN D-1000 MAX ST 1000 units tablet  Generic drug:  Cholecalciferol  Take 1 tablet by mouth 2 (two) times daily.         SignedNewman Pies D 06/15/2015, 8:07 AM

## 2015-06-26 DIAGNOSIS — X32XXXA Exposure to sunlight, initial encounter: Secondary | ICD-10-CM | POA: Diagnosis not present

## 2015-06-26 DIAGNOSIS — D2271 Melanocytic nevi of right lower limb, including hip: Secondary | ICD-10-CM | POA: Diagnosis not present

## 2015-06-26 DIAGNOSIS — L538 Other specified erythematous conditions: Secondary | ICD-10-CM | POA: Diagnosis not present

## 2015-06-26 DIAGNOSIS — D2262 Melanocytic nevi of left upper limb, including shoulder: Secondary | ICD-10-CM | POA: Diagnosis not present

## 2015-06-26 DIAGNOSIS — C44619 Basal cell carcinoma of skin of left upper limb, including shoulder: Secondary | ICD-10-CM | POA: Diagnosis not present

## 2015-06-26 DIAGNOSIS — B078 Other viral warts: Secondary | ICD-10-CM | POA: Diagnosis not present

## 2015-06-26 DIAGNOSIS — D2272 Melanocytic nevi of left lower limb, including hip: Secondary | ICD-10-CM | POA: Diagnosis not present

## 2015-06-26 DIAGNOSIS — D485 Neoplasm of uncertain behavior of skin: Secondary | ICD-10-CM | POA: Diagnosis not present

## 2015-06-26 DIAGNOSIS — L570 Actinic keratosis: Secondary | ICD-10-CM | POA: Diagnosis not present

## 2015-06-26 DIAGNOSIS — D225 Melanocytic nevi of trunk: Secondary | ICD-10-CM | POA: Diagnosis not present

## 2015-07-07 DIAGNOSIS — M542 Cervicalgia: Secondary | ICD-10-CM | POA: Diagnosis not present

## 2015-07-08 DIAGNOSIS — L298 Other pruritus: Secondary | ICD-10-CM | POA: Diagnosis not present

## 2015-07-08 DIAGNOSIS — T63481A Toxic effect of venom of other arthropod, accidental (unintentional), initial encounter: Secondary | ICD-10-CM | POA: Diagnosis not present

## 2015-07-08 DIAGNOSIS — S40862A Insect bite (nonvenomous) of left upper arm, initial encounter: Secondary | ICD-10-CM | POA: Diagnosis not present

## 2015-07-31 DIAGNOSIS — E785 Hyperlipidemia, unspecified: Secondary | ICD-10-CM | POA: Diagnosis not present

## 2015-07-31 DIAGNOSIS — Z79899 Other long term (current) drug therapy: Secondary | ICD-10-CM | POA: Diagnosis not present

## 2015-07-31 DIAGNOSIS — R739 Hyperglycemia, unspecified: Secondary | ICD-10-CM | POA: Diagnosis not present

## 2015-08-01 ENCOUNTER — Encounter: Payer: Self-pay | Admitting: Family Medicine

## 2015-08-01 ENCOUNTER — Ambulatory Visit (INDEPENDENT_AMBULATORY_CARE_PROVIDER_SITE_OTHER): Payer: PPO | Admitting: Family Medicine

## 2015-08-01 VITALS — BP 122/84 | HR 105 | Temp 98.3°F | Resp 18 | Ht 63.0 in | Wt 186.9 lb

## 2015-08-01 DIAGNOSIS — G8929 Other chronic pain: Secondary | ICD-10-CM

## 2015-08-01 DIAGNOSIS — E8881 Metabolic syndrome: Secondary | ICD-10-CM | POA: Diagnosis not present

## 2015-08-01 DIAGNOSIS — Z23 Encounter for immunization: Secondary | ICD-10-CM | POA: Diagnosis not present

## 2015-08-01 DIAGNOSIS — M549 Dorsalgia, unspecified: Secondary | ICD-10-CM

## 2015-08-01 DIAGNOSIS — F411 Generalized anxiety disorder: Secondary | ICD-10-CM | POA: Diagnosis not present

## 2015-08-01 DIAGNOSIS — Z9889 Other specified postprocedural states: Secondary | ICD-10-CM

## 2015-08-01 DIAGNOSIS — Z95828 Presence of other vascular implants and grafts: Secondary | ICD-10-CM | POA: Insufficient documentation

## 2015-08-01 DIAGNOSIS — K219 Gastro-esophageal reflux disease without esophagitis: Secondary | ICD-10-CM | POA: Diagnosis not present

## 2015-08-01 DIAGNOSIS — G43009 Migraine without aura, not intractable, without status migrainosus: Secondary | ICD-10-CM

## 2015-08-01 DIAGNOSIS — G4700 Insomnia, unspecified: Secondary | ICD-10-CM | POA: Diagnosis not present

## 2015-08-01 LAB — COMPREHENSIVE METABOLIC PANEL
A/G RATIO: 1.9 (ref 1.2–2.2)
ALT: 48 IU/L — AB (ref 0–32)
AST: 36 IU/L (ref 0–40)
Albumin: 4.3 g/dL (ref 3.6–4.8)
Alkaline Phosphatase: 96 IU/L (ref 39–117)
BUN/Creatinine Ratio: 20 (ref 12–28)
BUN: 15 mg/dL (ref 8–27)
Bilirubin Total: 0.5 mg/dL (ref 0.0–1.2)
CALCIUM: 9.4 mg/dL (ref 8.7–10.3)
CO2: 27 mmol/L (ref 18–29)
Chloride: 95 mmol/L — ABNORMAL LOW (ref 96–106)
Creatinine, Ser: 0.76 mg/dL (ref 0.57–1.00)
GFR, EST AFRICAN AMERICAN: 99 mL/min/{1.73_m2} (ref >59)
GFR, EST NON AFRICAN AMERICAN: 86 mL/min/{1.73_m2} (ref >59)
GLOBULIN, TOTAL: 2.3 g/dL (ref 1.5–4.5)
Glucose: 118 mg/dL — ABNORMAL HIGH (ref 65–99)
Potassium: 4.7 mmol/L (ref 3.5–5.2)
SODIUM: 139 mmol/L (ref 134–144)
TOTAL PROTEIN: 6.6 g/dL (ref 6.0–8.5)

## 2015-08-01 LAB — HEMOGLOBIN A1C
ESTIMATED AVERAGE GLUCOSE: 128 mg/dL
Hgb A1c MFr Bld: 6.1 % — ABNORMAL HIGH (ref 4.8–5.6)

## 2015-08-01 LAB — LIPID PANEL
CHOLESTEROL TOTAL: 161 mg/dL (ref 100–199)
Chol/HDL Ratio: 4.1 ratio units (ref 0.0–4.4)
HDL: 39 mg/dL — ABNORMAL LOW (ref >39)
LDL CALC: 50 mg/dL (ref 0–99)
TRIGLYCERIDES: 362 mg/dL — AB (ref 0–149)
VLDL Cholesterol Cal: 72 mg/dL — ABNORMAL HIGH (ref 5–40)

## 2015-08-01 MED ORDER — OXYCODONE-ACETAMINOPHEN 10-325 MG PO TABS: 1.0000 | ORAL_TABLET | ORAL | Status: DC | PRN

## 2015-08-01 MED ORDER — ZOLPIDEM TARTRATE 10 MG PO TABS
10.0000 mg | ORAL_TABLET | Freq: Every day | ORAL | Status: DC
Start: 2015-08-01 — End: 2015-11-01

## 2015-08-01 MED ORDER — OXYCODONE-ACETAMINOPHEN 10-325 MG PO TABS: 1.0000 | ORAL_TABLET | Freq: Three times a day (TID) | ORAL | Status: DC | PRN

## 2015-08-01 MED ORDER — CYCLOBENZAPRINE HCL 10 MG PO TABS: 10.0000 mg | ORAL_TABLET | Freq: Every evening | ORAL | Status: DC

## 2015-08-01 MED ORDER — METFORMIN HCL 500 MG PO TABS: 500.0000 mg | ORAL_TABLET | Freq: Every day | ORAL | Status: DC

## 2015-08-01 NOTE — Progress Notes (Signed)
Name: Jessica Burgess   MRN: [id number]    DOB: 12-26-1955   Date:08/01/2015       Progress Note  Subjective  Chief Complaint  Chief Complaint  Patient presents with  . Neck Pain    patient stated that she had neck surgery in March (Dr. Arnoldo Morale) where she had 2 disc dused. f/u is scheduled for Aug (6th).  . Depression  . Insomnia  . Anxiety  . Back Pain    patient stated that it is persistent. patient will be following up with Dr. Arnoldo Morale for this once her neck is totally taken care of.  . Allergic Rhinitis   . Hyperglycemia  . dyslipidemia  . Medication Refill    patient need 6 medications refilled.    HPI    Dyslipidemia: she is taking fish oil, triglycerides getting worse again. Can't afford rx medication for triglycerides and will try changing her diet  Chronic back pain; previous history of back surgery, still has daily pain. Took Fentanyl patch years ago but caused worsening of migraines. She has been taking Percocet for many years. Explained risk of concomitant use of Ambien, BZD's, Flexeril and opiates since they are all sedating and can cause respiratory suppression and death. She will try weaning off Klonopin to take prn, and not take pain medication with Ambien at the same time in the evening for now. Discussed replacing Percocet with long acting narcotics and she will check coverage with pharmacy. Pain today is high 8/10 had to work in her yard.  Insomnia: takes Zolpidem, currently carrying for her almost 11  yo roommate - and she wakes her up with a whistle in the middle of the night  Depression Major : taking medication daily, she is feeling overwhelmed carrying for her roommate, mood has improved since she had neck surgery.  Metabolic Syndrome/Prediabetes: hgbA1C has gone up, discussed life style changes but she would like to try Metformin  Cervical spine stenosis and radiculitis: s/p neck surgery times 4 now - last one March 2017 by Dr. Arnoldo Morale. She is doing well  now, no longer has tingling or numbness, daily headaches have resolved.   Patient Active Problem List   Diagnosis Date Noted  . History of neck surgery 08/01/2015  . Hematoma 06/14/2015  . Perennial allergic rhinitis 05/05/2015  . Generalized anxiety disorder 02/03/2015  . Back pain, chronic 08/25/2014  . Insomnia, persistent 08/25/2014  . Major depression, chronic ([place]) 08/25/2014  . Dyslipidemia 08/25/2014  . Gastro-esophageal reflux disease without esophagitis 08/25/2014  . H/O high risk medication treatment 08/25/2014  . Blood glucose elevated 08/25/2014  . Migraine without aura and without status migrainosus, not intractable 08/25/2014  . Climacteric 08/25/2014  . Dysmetabolic syndrome 51/76/1607  . Fungal infection of toenail 08/25/2014  . Obesity (BMI 30.0-34.9) 08/25/2014  . Vitamin D deficiency 08/25/2014  . Engages in travel abroad 08/25/2014  . Cervical disc disorder with radiculopathy 04/28/2013    Past Surgical History  Procedure Laterality Date  . Neck surgery      x3  . Back surgery      x2 Lower   . Tonsillectomy    . Abdominal hysterectomy    . Anterior cervical decomp/discectomy fusion N/A 06/07/2015    Procedure: Cervical three - four and Cervical six- seven anterior cervical decompression with fusion interbody prosthesis plating and bonegraft;  Surgeon: Newman Pies, MD;  Location: [facility];  Service: Neurosurgery;  Laterality: N/A;  C34 and C67 anterior cervical decompression with fusion interbody prosthesis  plating and bonegraft  . Evacuation of cervical hematoma N/A 06/14/2015    Procedure: EVACUATION OF CERVICAL HEMATOMA;  Surgeon: Newman Pies, MD;  Location: [facility];  Service: Neurosurgery;  Laterality: N/A;    Family History  Problem Relation Age of Onset  . Depression Mother   . Migraines Mother   . Dementia Father   . Hyperlipidemia Brother   . Breast cancer Paternal Aunt     52s    Social History   Social History  .  Marital Status: Single    Spouse Name: N/A  . Number of Children: 0  . Years of Education: 12+   Occupational History  .    [employer] Disabled     Social History Main Topics  . Smoking status: Former Smoker -- 1.50 packs/day for 20 years    Types: Cigarettes    Start date: 03/19/1979    Quit date: 08/25/1999  . Smokeless tobacco: Never Used  . Alcohol Use: 0.0 oz/week    0 Standard drinks or equivalent per week     Comment: wine occassionally  . Drug Use: No  . Sexual Activity: No   Other Topics Concern  . Not on file   Social History Narrative   Patient is single.    Patient lives with roommates.    Patient on disability    Patient has no children.    Patient has some college      Current outpatient prescriptions:  .  albuterol (PROVENTIL) (2.5 MG/3ML) 0.083% nebulizer solution, Take 3 mLs (2.5 mg total) by nebulization every 6 (six) hours as needed for wheezing or shortness of breath., Disp: 75 mL, Rfl: 2 .  Ascorbic Acid (VITAMIN C PO), Take by mouth., Disp: , Rfl:  .  atorvastatin (LIPITOR) 40 MG tablet, Take 1 tablet (40 mg total) by mouth daily., Disp: 90 tablet, Rfl: 4 .  Biotin 2500 MCG CAPS, Take 1 tablet by mouth daily. , Disp: , Rfl:  .  Cholecalciferol (VITAMIN D-1000 MAX ST) 1000 UNITS tablet, Take 1 tablet by mouth 2 (two) times daily., Disp: , Rfl:  .  clonazePAM (KLONOPIN) 0.5 MG tablet, take 1 tablet by mouth once daily if needed for anxiety (Patient taking differently: Take 0.5 mg by mouth 3 (three) times daily as needed for anxiety. ), Disp: 30 tablet, Rfl: 2 .  Cyanocobalamin (B-12 PO), Take 1 tablet by mouth daily. , Disp: , Rfl:  .  cyclobenzaprine (FLEXERIL) 10 MG tablet, Take 1 tablet (10 mg total) by mouth every evening., Disp: 90 tablet, Rfl: 0 .  docusate sodium (COLACE) 100 MG capsule, Take 1 capsule (100 mg total) by mouth 2 (two) times daily., Disp: 60 capsule, Rfl: 0 .  estradiol (ESTRACE) 0.5 MG tablet, Take 0.5 mg by mouth daily., Disp: , Rfl: 0 .   ferrous sulfate (IRON SUPPLEMENT) 325 (65 FE) MG tablet, Take 1 tablet by mouth daily., Disp: , Rfl:  .  FLUoxetine (PROZAC) 20 MG capsule, Take 3 capsules (60 mg total) by mouth daily., Disp: 270 capsule, Rfl: 1 .  fluticasone (FLONASE) 50 MCG/ACT nasal spray, Place 2 sprays into both nostrils as needed. (Patient taking differently: Place 2 sprays into both nostrils as needed for allergies. ), Disp: 16 g, Rfl: 5 .  fluticasone furoate-vilanterol (BREO ELLIPTA) 100-25 MCG/INH AEPB, Inhale 1 puff into the lungs daily. (Patient taking differently: Inhale 1 puff into the lungs daily as needed. For shortness of breath), Disp: 60 each, Rfl: 0 .  loratadine (  CLARITIN) 10 MG tablet, Take 1 tablet (10 mg total) by mouth 2 (two) times daily., Disp: 180 tablet, Rfl: 1 .  Magnesium Oxide 500 MG CAPS, Take 500 mg by mouth 2 (two) times daily. , Disp: , Rfl:  .  Multiple Vitamins-Minerals (MULTIVITAMIN PO), Take 1 tablet by mouth daily. , Disp: , Rfl:  .  Omega-3 Fatty Acids (FISH OIL PO), Take by mouth., Disp: , Rfl:  .  omeprazole (PRILOSEC) 40 MG capsule, take 1 capsule by mouth once daily, Disp: 90 capsule, Rfl: 1 .  oxyCODONE-acetaminophen (PERCOCET) 10-325 MG tablet, Take 1 tablet by mouth every 4 (four) hours as needed for pain., Disp: 100 tablet, Rfl: 0 .  potassium gluconate 595 MG TABS tablet, Take 595 mg by mouth daily., Disp: , Rfl:  .  SUMAtriptan (IMITREX) 100 MG tablet, take 1 tablet by mouth if needed for migraines, Disp: 27 tablet, Rfl: 1 .  zolpidem (AMBIEN) 10 MG tablet, Take 1 tablet (10 mg total) by mouth at bedtime., Disp: 90 tablet, Rfl: 0 .  oxyCODONE-acetaminophen (PERCOCET) 10-325 MG tablet, Take 1 tablet by mouth every 4 (four) hours as needed for pain., Disp: 100 tablet, Rfl: 0 .  oxyCODONE-acetaminophen (PERCOCET) 10-325 MG tablet, Take 1 tablet by mouth every 8 (eight) hours as needed for pain., Disp: 100 tablet, Rfl: 0  No Known Allergies   ROS  Constitutional: Negative for  fever , positive for  weight change.  Respiratory: Negative for cough and shortness of breath.   Cardiovascular: Negative for chest pain or palpitations.  Gastrointestinal: Negative for abdominal pain, no bowel changes.  Musculoskeletal: Negative for gait problem or joint swelling.  Skin: Negative for rash.  Neurological: Negative for dizziness or headache.  No other specific complaints in a complete review of systems (except as listed in HPI above).  Objective  Filed Vitals:   08/01/15 1439  BP: 122/84  Pulse: 105  Temp: 98.3 F (36.8 C)  TempSrc: Oral  Resp: 18  Height: _0  (1.6 m)  Weight: 186 lb 14.4 oz (84.777 kg)  SpO2: 98%    Body mass index is 33.12 kg/(m^2).  Physical Exam  Constitutional: Patient appears well-developed and well-nourished. Obese  No distress.  HEENT: head atraumatic, normocephalic, pupils equal and reactive to light, neck has decreased rom, throat within normal limits Cardiovascular: Normal rate, regular rhythm and normal heart sounds.  No murmur heard. No BLE edema. Pulmonary/Chest: Effort normal and breath sounds normal. No respiratory distress. Abdominal: Soft.  There is no tenderness. Psychiatric: Patient has a normal mood and affect. behavior is normal. Judgment and thought content normal. Muscular Skeletal: pain during palpation of lumbar spine, negative straight leg raise  Recent Results (from the past 2160 hour(s))  CBC     Status: None   Collection Time: 05/30/15  1:25 PM  Result Value Ref Range   WBC 7.8 4.0 - 10.5 K/uL   RBC 4.43 3.87 - 5.11 MIL/uL   Hemoglobin 13.9 12.0 - 15.0 g/dL   HCT 39.4 36.0 - 46.0 %   MCV 88.9 78.0 - 100.0 fL   MCH 31.4 26.0 - 34.0 pg   MCHC 35.3 30.0 - 36.0 g/dL   RDW 12.1 11.5 - 15.5 %   Platelets 281 150 - 400 K/uL  Basic metabolic panel     Status: Abnormal   Collection Time: 05/30/15  1:25 PM  Result Value Ref Range   Sodium 141 135 - 145 mmol/L   Potassium 3.8 3.5 - 5.1 mmol/L  Chloride 103  101 - 111 mmol/L   CO2 30 22 - 32 mmol/L   Glucose, Bld 103 (H) 65 - 99 mg/dL   BUN 15 6 - 20 mg/dL   Creatinine, Ser 0.73 0.44 - 1.00 mg/dL   Calcium 9.3 8.9 - 10.3 mg/dL   GFR calc non Af Amer >60 >60 mL/min   GFR calc Af Amer >60 >60 mL/min    Comment: (NOTE) The eGFR has been calculated using the CKD EPI equation. This calculation has not been validated in all clinical situations. eGFR's persistently <60 mL/min signify possible Chronic Kidney Disease.    Anion gap 8 5 - 15  Surgical pcr screen     Status: None   Collection Time: 05/30/15  1:25 PM  Result Value Ref Range   MRSA, PCR NEGATIVE NEGATIVE   Staphylococcus aureus NEGATIVE NEGATIVE    Comment:        The Xpert SA Assay (FDA approved for NASAL specimens in patients over 67 years of age), is one component of a comprehensive surveillance program.  Test performance has been validated by [facility] for patients greater than or equal to 32 year old. It is not intended to diagnose infection nor to guide or monitor treatment.   Lipid panel     Status: Abnormal   Collection Time: 07/31/15 12:46 PM  Result Value Ref Range   Cholesterol, Total 161 100 - 199 mg/dL   Triglycerides 362 (H) 0 - 149 mg/dL   HDL 39 (L) >39 mg/dL   VLDL Cholesterol Cal 72 (H) 5 - 40 mg/dL   LDL Calculated 50 0 - 99 mg/dL   Chol/HDL Ratio 4.1 0.0 - 4.4 ratio units    Comment:                                   T. Chol/HDL Ratio                                             Men  Women                               1/2 Avg.Risk  3.4    3.3                                   Avg.Risk  5.0    4.4                                2X Avg.Risk  9.6    7.1                                3X Avg.Risk 23.4   11.0   Comprehensive metabolic panel     Status: Abnormal   Collection Time: 07/31/15 12:46 PM  Result Value Ref Range   Glucose 118 (H) 65 - 99 mg/dL   BUN 15 8 - 27 mg/dL   Creatinine, Ser 0.76 0.57 - 1.00 mg/dL   GFR calc non Af Amer 86 >59  mL/min/1.73   GFR calc Af Wyvonnia Lora  99 >59 mL/min/1.73   BUN/Creatinine Ratio 20 12 - 28   Sodium 139 134 - 144 mmol/L   Potassium 4.7 3.5 - 5.2 mmol/L   Chloride 95 (L) 96 - 106 mmol/L   CO2 27 18 - 29 mmol/L   Calcium 9.4 8.7 - 10.3 mg/dL   Total Protein 6.6 6.0 - 8.5 g/dL   Albumin 4.3 3.6 - 4.8 g/dL   Globulin, Total 2.3 1.5 - 4.5 g/dL   Albumin/Globulin Ratio 1.9 1.2 - 2.2   Bilirubin Total 0.5 0.0 - 1.2 mg/dL   Alkaline Phosphatase 96 39 - 117 IU/L   AST 36 0 - 40 IU/L   ALT 48 (H) 0 - 32 IU/L  Hemoglobin A1c     Status: Abnormal   Collection Time: 07/31/15 12:46 PM  Result Value Ref Range   Hgb A1c MFr Bld 6.1 (H) 4.8 - 5.6 %    Comment:          Pre-diabetes: 5.7 - 6.4          Diabetes: >6.4          Glycemic control for adults with diabetes: <7.0    Est. average glucose Bld gHb Est-mCnc 128 mg/dL    PHQ2/9: Depression screen [facility] 2/9 08/01/2015 05/05/2015 02/03/2015 11/25/2014 08/25/2014  Decreased Interest 0 0 0 1 1  Down, Depressed, Hopeless 0 0 0 2 2  PHQ - 2 Score 0 0 0 3 3  Altered sleeping - - - 3 3  Tired, decreased energy - - - 3 3  Change in appetite - - - 0 0  Feeling bad or failure about yourself  - - - 0 0  Trouble concentrating - - - 3 1  Moving slowly or fidgety/restless - - - 0 0  Suicidal thoughts - - - 0 0  PHQ-9 Score - - - 12 10  Difficult doing work/chores - - - Not difficult at all Not difficult at all     Fall Risk: Fall Risk  08/01/2015 05/05/2015 02/03/2015 11/25/2014 08/25/2014  Falls in the past year? No No No Yes Yes  Number falls in past yr: - - - 2 or more 1  Injury with Fall? - - - No No     Functional Status Survey: Is the patient deaf or have difficulty hearing?: No Does the patient have difficulty seeing, even when wearing glasses/contacts?: No Does the patient have difficulty concentrating, remembering, or making decisions?: No Does the patient have difficulty walking or climbing stairs?: No Does the patient have difficulty dressing  or bathing?: No Does the patient have difficulty doing errands alone such as visiting a doctor's office or shopping?: No    Assessment & Plan  1. Back pain, chronic  Discussed changing medication but she is not sure about rx coverage. Discussed risk of respiratory suppression and death.  - oxyCODONE-acetaminophen (PERCOCET) 10-325 MG tablet; Take 1 tablet by mouth every 4 (four) hours as needed for pain.  Dispense: 100 tablet; Refill: 0 - oxyCODONE-acetaminophen (PERCOCET) 10-325 MG tablet; Take 1 tablet by mouth every 4 (four) hours as needed for pain.  Dispense: 100 tablet; Refill: 0 - oxyCODONE-acetaminophen (PERCOCET) 10-325 MG tablet; Take 1 tablet by mouth every 8 (eight) hours as needed for pain.  Dispense: 100 tablet; Refill: 0 - cyclobenzaprine (FLEXERIL) 10 MG tablet; Take 1 tablet (10 mg total) by mouth every evening.  Dispense: 90 tablet; Refill: 0  2. Migraine without aura and without status migrainosus, not intractable  Continue  prn medication, doing better  3. Gastro-esophageal reflux disease without esophagitis  Controlled at this time  4. Insomnia, persistent  - zolpidem (AMBIEN) 10 MG tablet; Take 1 tablet (10 mg total) by mouth at bedtime.  Dispense: 90 tablet; Refill: 0  5. Dysmetabolic syndrome  Discussed diet and she will try but also requested medication to help prevent diabetes - metFORMIN (GLUCOPHAGE) 500 MG tablet; Take 1 tablet (500 mg total) by mouth daily.  Dispense: 90 tablet; Refill: 1  6. History of neck surgery  Doing well   7. Generalized anxiety disorder  Advised to wean off Klonopin and only take it prn, she will try   8. Need for shingles vaccine  - Varicella-zoster vaccine subcutaneous - not given, she will check coverage with her insurance

## 2015-08-17 ENCOUNTER — Other Ambulatory Visit: Payer: Self-pay | Admitting: Family Medicine

## 2015-08-18 NOTE — Telephone Encounter (Signed)
Patient requesting refill. 

## 2015-08-22 DIAGNOSIS — C44619 Basal cell carcinoma of skin of left upper limb, including shoulder: Secondary | ICD-10-CM | POA: Diagnosis not present

## 2015-08-22 DIAGNOSIS — L905 Scar conditions and fibrosis of skin: Secondary | ICD-10-CM | POA: Diagnosis not present

## 2015-09-13 DIAGNOSIS — H52223 Regular astigmatism, bilateral: Secondary | ICD-10-CM | POA: Diagnosis not present

## 2015-09-13 DIAGNOSIS — H5213 Myopia, bilateral: Secondary | ICD-10-CM | POA: Diagnosis not present

## 2015-09-13 DIAGNOSIS — H524 Presbyopia: Secondary | ICD-10-CM | POA: Diagnosis not present

## 2015-09-13 DIAGNOSIS — H04123 Dry eye syndrome of bilateral lacrimal glands: Secondary | ICD-10-CM | POA: Diagnosis not present

## 2015-09-13 DIAGNOSIS — H2513 Age-related nuclear cataract, bilateral: Secondary | ICD-10-CM | POA: Diagnosis not present

## 2015-09-25 ENCOUNTER — Other Ambulatory Visit: Payer: Self-pay | Admitting: Family Medicine

## 2015-09-25 NOTE — Telephone Encounter (Signed)
Patient requesting refill. 

## 2015-09-29 ENCOUNTER — Other Ambulatory Visit: Payer: Self-pay | Admitting: Family Medicine

## 2015-09-29 DIAGNOSIS — Z1231 Encounter for screening mammogram for malignant neoplasm of breast: Secondary | ICD-10-CM

## 2015-10-02 ENCOUNTER — Ambulatory Visit
Admission: RE | Admit: 2015-10-02 | Discharge: 2015-10-02 | Disposition: A | Discharge status: Home / Self Care | Payer: PPO | Source: Ambulatory Visit | Attending: Family Medicine | Admitting: Family Medicine

## 2015-10-02 ENCOUNTER — Other Ambulatory Visit: Payer: Self-pay | Admitting: Family Medicine

## 2015-10-02 DIAGNOSIS — Z1231 Encounter for screening mammogram for malignant neoplasm of breast: Secondary | ICD-10-CM

## 2015-10-17 DIAGNOSIS — M4722 Other spondylosis with radiculopathy, cervical region: Secondary | ICD-10-CM | POA: Diagnosis not present

## 2015-10-17 DIAGNOSIS — M5416 Radiculopathy, lumbar region: Secondary | ICD-10-CM | POA: Diagnosis not present

## 2015-10-17 DIAGNOSIS — M542 Cervicalgia: Secondary | ICD-10-CM | POA: Diagnosis not present

## 2015-10-17 DIAGNOSIS — M545 Low back pain: Secondary | ICD-10-CM | POA: Diagnosis not present

## 2015-10-25 DIAGNOSIS — M4806 Spinal stenosis, lumbar region: Secondary | ICD-10-CM | POA: Diagnosis not present

## 2015-10-25 DIAGNOSIS — M5416 Radiculopathy, lumbar region: Secondary | ICD-10-CM | POA: Diagnosis not present

## 2015-11-01 ENCOUNTER — Encounter: Payer: Self-pay | Admitting: Family Medicine

## 2015-11-01 ENCOUNTER — Ambulatory Visit (INDEPENDENT_AMBULATORY_CARE_PROVIDER_SITE_OTHER): Payer: PPO | Admitting: Family Medicine

## 2015-11-01 VITALS — BP 122/66 | HR 86 | Temp 97.9°F | Resp 16 | Ht 63.0 in | Wt 171.3 lb

## 2015-11-01 DIAGNOSIS — Z79899 Other long term (current) drug therapy: Secondary | ICD-10-CM

## 2015-11-01 DIAGNOSIS — E669 Obesity, unspecified: Secondary | ICD-10-CM | POA: Diagnosis not present

## 2015-11-01 DIAGNOSIS — F329 Major depressive disorder, single episode, unspecified: Secondary | ICD-10-CM | POA: Diagnosis not present

## 2015-11-01 DIAGNOSIS — E785 Hyperlipidemia, unspecified: Secondary | ICD-10-CM | POA: Diagnosis not present

## 2015-11-01 DIAGNOSIS — G8929 Other chronic pain: Secondary | ICD-10-CM

## 2015-11-01 DIAGNOSIS — M542 Cervicalgia: Secondary | ICD-10-CM

## 2015-11-01 DIAGNOSIS — K219 Gastro-esophageal reflux disease without esophagitis: Secondary | ICD-10-CM | POA: Diagnosis not present

## 2015-11-01 DIAGNOSIS — M549 Dorsalgia, unspecified: Secondary | ICD-10-CM

## 2015-11-01 DIAGNOSIS — E8881 Metabolic syndrome: Secondary | ICD-10-CM

## 2015-11-01 DIAGNOSIS — G4700 Insomnia, unspecified: Secondary | ICD-10-CM

## 2015-11-01 DIAGNOSIS — F411 Generalized anxiety disorder: Secondary | ICD-10-CM

## 2015-11-01 DIAGNOSIS — R739 Hyperglycemia, unspecified: Secondary | ICD-10-CM | POA: Diagnosis not present

## 2015-11-01 DIAGNOSIS — F419 Anxiety disorder, unspecified: Secondary | ICD-10-CM | POA: Diagnosis not present

## 2015-11-01 DIAGNOSIS — G43009 Migraine without aura, not intractable, without status migrainosus: Secondary | ICD-10-CM | POA: Diagnosis not present

## 2015-11-01 DIAGNOSIS — F1190 Opioid use, unspecified, uncomplicated: Secondary | ICD-10-CM | POA: Diagnosis not present

## 2015-11-01 MED ORDER — METFORMIN HCL 500 MG PO TABS: 500.0000 mg | ORAL_TABLET | Freq: Every day | ORAL | 1 refills | Status: DC

## 2015-11-01 MED ORDER — OMEPRAZOLE 20 MG PO CPDR: 20.0000 mg | DELAYED_RELEASE_CAPSULE | Freq: Every day | ORAL | 0 refills | Status: DC

## 2015-11-01 MED ORDER — ATORVASTATIN CALCIUM 40 MG PO TABS: 40.0000 mg | ORAL_TABLET | Freq: Every day | ORAL | 4 refills | Status: DC

## 2015-11-01 MED ORDER — ZOLPIDEM TARTRATE 10 MG PO TABS: 5.0000 mg | ORAL_TABLET | Freq: Every day | ORAL | 0 refills | Status: DC

## 2015-11-01 MED ORDER — OXYCODONE-ACETAMINOPHEN 10-325 MG PO TABS: 1.0000 | ORAL_TABLET | ORAL | 0 refills | Status: DC | PRN

## 2015-11-01 MED ORDER — RANITIDINE HCL 150 MG PO CAPS: 150.0000 mg | ORAL_CAPSULE | Freq: Every day | ORAL | 0 refills | Status: DC

## 2015-11-01 MED ORDER — FLUOXETINE HCL 20 MG PO CAPS: 60.0000 mg | ORAL_CAPSULE | Freq: Every day | ORAL | 1 refills | Status: DC

## 2015-11-01 MED ORDER — OXYCODONE-ACETAMINOPHEN 10-325 MG PO TABS: 1.0000 | ORAL_TABLET | Freq: Three times a day (TID) | ORAL | 0 refills | Status: DC | PRN

## 2015-11-01 MED ORDER — CYCLOBENZAPRINE HCL 10 MG PO TABS
5.0000 mg | ORAL_TABLET | Freq: Every evening | ORAL | 0 refills | Status: DC
Start: 2015-11-01 — End: 2016-02-02

## 2015-11-01 NOTE — Progress Notes (Signed)
Name: Jessica Burgess   MRN: [id number]    DOB: 03-02-1956   Date:11/01/2015       Progress Note  Subjective  Chief Complaint  Chief Complaint  Patient presents with  . Medication Refill    3 month F/U  . Insomnia    Patient states her sleep has improved sleeping a good 8-9 hour nightly  . Back Pain    Unchanged, but medication helps control symptoms  . Dyslipidemia    Patient has changed diet, high protein-protein shake for breakfast, protein bar for lunch and a regular meal for dinner. Patient is still taking Fish Oil twice daily   . Metabolic Syndrome    Patient has losted 16 pounds since last visit    HPI  Dyslipidemia: she is taking fish oil, triglycerides getting worse again. Can't afford rx medication for triglycerides , she is doing well with dietary modification.   Chronic back pain; previous history of back surgery, still has daily pain. Took Fentanyl patch years ago but caused worsening of migraines. She has been taking Percocet for many years. Explained risk of concomitant use of Ambien, BZD's, Flexeril and opiates since they are all sedating and can cause respiratory suppression and death. She has been  weaning off Klonopin to take prn, last rx filled 06/2015  and not take pain medication with Ambien at the same time in the evening . Down to half pill of Percocet - she will also try to go down to 5 mg of Ambien and 5 mg of Flexeril.  Discussed replacing Percocet with long acting narcotics but she is afraid of changing at this time. She sees Dr. Arnoldo Morale, and will have epidural injections soon. 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   Depression Major : taking medication daily, she is feeling overwhelmed carrying for her roommate, mood has improved since she had neck surgery, she is also on a healthy diet and has lost weight, feeling better about herself.    Metabolic Syndrome/Prediabetes: hgbA1C has gone up, she is on Metformin and life style modification. She denies polyphagia, polyuria or polydipsia.   Cervical spine stenosis and radiculitis: s/p neck surgery times 4 now - last one March 2017 by Dr. Arnoldo Morale. She is doing well now, no longer has tingling or numbness, daily headaches have resolved.    Patient Active Problem List   Diagnosis Date Noted  . History of neck surgery 08/01/2015  . Hematoma 06/14/2015  . Perennial allergic rhinitis 05/05/2015  . Generalized anxiety disorder 02/03/2015  . Back pain, chronic 08/25/2014  . Insomnia, persistent 08/25/2014  . Major depression, chronic ([place]) 08/25/2014  . Dyslipidemia 08/25/2014  . Gastro-esophageal reflux disease without esophagitis 08/25/2014  . H/O high risk medication treatment 08/25/2014  . Blood glucose elevated 08/25/2014  . Migraine without aura and without status migrainosus, not intractable 08/25/2014  . Climacteric 08/25/2014  . Dysmetabolic syndrome [id number]  . Fungal infection of toenail 08/25/2014  . Obesity (BMI 30.0-34.9) 08/25/2014  . Vitamin D deficiency 08/25/2014  . Engages in travel abroad 08/25/2014  . Cervical disc disorder with radiculopathy 04/28/2013    Past Surgical History:  Procedure Laterality Date  . ABDOMINAL HYSTERECTOMY    . ANTERIOR CERVICAL DECOMP/DISCECTOMY FUSION N/A 06/07/2015   Procedure: Cervical three - four and Cervical six- seven anterior cervical decompression with fusion interbody prosthesis plating and bonegraft;  Surgeon: Newman Pies, MD;  Location: [facility] NEURO ORS;  Service: Neurosurgery;  Laterality: N/A;  C34 and C67 anterior cervical decompression with fusion interbody prosthesis plating and bonegraft  . BACK SURGERY     x2 Lower   . EVACUATION OF CERVICAL HEMATOMA N/A 06/14/2015   Procedure: EVACUATION OF CERVICAL HEMATOMA;  Surgeon: Newman Pies, MD;  Location: [facility] NEURO ORS;  Service: Neurosurgery;  Laterality: N/A;   . NECK SURGERY     x3  . TONSILLECTOMY      Family History  Problem Relation Age of Onset  . Depression Mother   . Migraines Mother   . Dementia Father   . Hyperlipidemia Brother   . Breast cancer Paternal Aunt     34s    Social History   Social History  . Marital status: Single    Spouse name: N/A  . Number of children: 0  . Years of education: 12+   Occupational History  .  Disabled  . Disabled     Social History Main Topics  . Smoking status: Former Smoker    Packs/day: 1.50    Years: 20.00    Types: Cigarettes    Start date: 03/19/1979    Quit date: 08/25/1999  . Smokeless tobacco: Never Used  . Alcohol use 0.0 oz/week     Comment: wine occassionally  . Drug use: No  . Sexual activity: No   Other Topics Concern  . Not on file   Social History Narrative   Patient is single.    Patient lives with roommates.    Patient on disability    Patient has no children.    Patient has some college      Current Outpatient Prescriptions:  .  albuterol (PROVENTIL) (2.5 MG/3ML) 0.083% nebulizer solution, Take 3 mLs (2.5 mg total) by nebulization every 6 (six) hours as needed for wheezing or shortness of breath., Disp: 75 mL, Rfl: 2 .  Ascorbic Acid (VITAMIN C PO), Take by mouth., Disp: , Rfl:  .  atorvastatin (LIPITOR) 40 MG tablet, Take 1 tablet (40 mg total) by mouth daily., Disp: 90 tablet, Rfl: 4 .  Biotin 2500 MCG CAPS, Take 1 tablet by mouth daily. , Disp: , Rfl:  .  Cholecalciferol (VITAMIN D-1000 MAX ST) 1000 UNITS tablet, Take 1 tablet by mouth 2 (two) times daily., Disp: , Rfl:  .  clonazePAM (KLONOPIN) 0.5 MG tablet, take 1 tablet by mouth once daily if needed for anxiety (Patient taking differently: Take 0.5 mg by mouth 3 (three) times daily as needed for anxiety. ), Disp: 30 tablet, Rfl: 2 .  Cyanocobalamin (B-12 PO), Take 1 tablet by mouth daily. , Disp: , Rfl:  .  cyclobenzaprine (FLEXERIL) 10 MG tablet, Take 0.5-1 tablets (5-10 mg total) by mouth every  evening., Disp: 90 tablet, Rfl: 0 .  docusate sodium (COLACE) 100 MG capsule, Take 1 capsule (100 mg total) by mouth 2 (two) times daily., Disp: 60 capsule, Rfl: 0 .  estradiol (ESTRACE) 0.5 MG tablet, take 1 tablet by mouth once daily, Disp: 90 tablet, Rfl: 4 .  ferrous sulfate (IRON SUPPLEMENT) 325 (65 FE) MG tablet, Take 1 tablet by mouth daily., Disp: , Rfl:  .  FLUoxetine (PROZAC) 20 MG capsule, Take 3 capsules (60 mg total) by mouth daily., Disp: 270 capsule, Rfl: 1 .  fluticasone (FLONASE) 50 MCG/ACT nasal spray, Place 2 sprays into both nostrils as needed. (Patient taking differently: Place 2 sprays into both nostrils as needed for allergies. ),  Disp: 16 g, Rfl: 5 .  loratadine (CLARITIN) 10 MG tablet, Take 1 tablet (10 mg total) by mouth 2 (two) times daily., Disp: 180 tablet, Rfl: 1 .  Magnesium Oxide 500 MG CAPS, Take 500 mg by mouth 2 (two) times daily. , Disp: , Rfl:  .  metFORMIN (GLUCOPHAGE) 500 MG tablet, Take 1 tablet (500 mg total) by mouth daily., Disp: 90 tablet, Rfl: 1 .  Multiple Vitamins-Minerals (MULTIVITAMIN PO), Take 1 tablet by mouth daily. , Disp: , Rfl:  .  Omega-3 Fatty Acids (FISH OIL PO), Take by mouth., Disp: , Rfl:  .  omeprazole (PRILOSEC) 20 MG capsule, Take 1 capsule (20 mg total) by mouth daily., Disp: 90 capsule, Rfl: 0 .  oxyCODONE-acetaminophen (PERCOCET) 10-325 MG tablet, Take 1 tablet by mouth every 4 (four) hours as needed for pain., Disp: 100 tablet, Rfl: 0 .  oxyCODONE-acetaminophen (PERCOCET) 10-325 MG tablet, Take 1 tablet by mouth every 4 (four) hours as needed for pain., Disp: 100 tablet, Rfl: 0 .  oxyCODONE-acetaminophen (PERCOCET) 10-325 MG tablet, Take 1 tablet by mouth every 8 (eight) hours as needed for pain., Disp: 100 tablet, Rfl: 0 .  potassium gluconate 595 MG TABS tablet, Take 595 mg by mouth daily., Disp: , Rfl:  .  SUMAtriptan (IMITREX) 100 MG tablet, take 1 tablet by mouth if needed for migraines, Disp: 27 tablet, Rfl: 1 .  zolpidem  (AMBIEN) 10 MG tablet, Take 0.5-1 tablets (5-10 mg total) by mouth at bedtime., Disp: 90 tablet, Rfl: 0 .  ranitidine (ZANTAC) 150 MG capsule, Take 1 capsule (150 mg total) by mouth daily. To alternate with omeprazole and try to wean self off, Disp: 90 capsule, Rfl: 0  No Known Allergies   ROS  Constitutional: Negative for fever , positive for weight change.  Respiratory: Negative for cough and shortness of breath.   Cardiovascular: Negative for chest pain or palpitations.  Gastrointestinal: Negative for abdominal pain, no bowel changes.  Musculoskeletal: Negative for gait problem or joint swelling.  Skin: Negative for rash.  Neurological: Negative for dizziness , positive for intermittent headache.  No other specific complaints in a complete review of systems (except as listed in HPI above).  Objective  Vitals:   11/01/15 1352  BP: 122/66  Pulse: 86  Resp: 16  Temp: 97.9 F (36.6 C)  TempSrc: Oral  SpO2: 96%  Weight: 171 lb 4.8 oz (77.7 kg)  Height: 5\' 3"  (1.6 m)    Body mass index is 30.34 kg/m.  Physical Exam  Constitutional: Patient appears well-developed and well-nourished. Obese No distress.  HEENT: head atraumatic, normocephalic, pupils equal and reactive to light, throat within normal limits Cardiovascular: Normal rate, regular rhythm and normal heart sounds.  No murmur heard. No BLE edema. Pulmonary/Chest: Effort normal and breath sounds normal. No respiratory distress. Abdominal: Soft.  There is no tenderness. Psychiatric: Patient has a normal mood and affect. behavior is normal. Judgment and thought content normal. Muscular Skeletal: pain during palpation of lumbar spine, paraspinal muscles and also on left lower back. Negative straight leg raise, she has decrease rom of neck from previous neck surgery ( fusions )  PHQ2/9: Depression screen [facility] 2/9 11/01/2015 08/01/2015 05/05/2015 02/03/2015 11/25/2014  Decreased Interest 0 0 0 0 1  Down, Depressed, Hopeless 0 0  0 0 2  PHQ - 2 Score 0 0 0 0 3  Altered sleeping - - - - 3  Tired, decreased energy - - - - 3  Change in appetite - - - -  0  Feeling bad or failure about yourself  - - - - 0  Trouble concentrating - - - - 3  Moving slowly or fidgety/restless - - - - 0  Suicidal thoughts - - - - 0  PHQ-9 Score - - - - 12  Difficult doing work/chores - - - - Not difficult at all    Fall Risk: Fall Risk  08/01/2015 05/05/2015 02/03/2015 11/25/2014 08/25/2014  Falls in the past year? No No No Yes Yes  Number falls in past yr: - - - 2 or more 1  Injury with Fall? - - - No No    Functional Status Survey: Is the patient deaf or have difficulty hearing?: No Does the patient have difficulty seeing, even when wearing glasses/contacts?: No Does the patient have difficulty concentrating, remembering, or making decisions?: No Does the patient have difficulty walking or climbing stairs?: No Does the patient have difficulty dressing or bathing?: No Does the patient have difficulty doing errands alone such as visiting a doctor's office or shopping?: No    Assessment & Plan  1. Back pain, chronic  Pain contract signed, data-base checked, urine drug screen today - oxyCODONE-acetaminophen (PERCOCET) 10-325 MG tablet; Take 1 tablet by mouth every 4 (four) hours as needed for pain.  Dispense: 100 tablet; Refill: 0 - oxyCODONE-acetaminophen (PERCOCET) 10-325 MG tablet; Take 1 tablet by mouth every 4 (four) hours as needed for pain.  Dispense: 100 tablet; Refill: 0 - oxyCODONE-acetaminophen (PERCOCET) 10-325 MG tablet; Take 1 tablet by mouth every 8 (eight) hours as needed for pain.  Dispense: 100 tablet; Refill: 0 - cyclobenzaprine (FLEXERIL) 10 MG tablet; Take 0.5-1 tablets (5-10 mg total) by mouth every evening.  Dispense: 90 tablet; Refill: 0 - Drug Screen, Urine  2. Migraine without aura and without status migrainosus, not intractable  Doing well at this time  3. Gastro-esophageal reflux disease without  esophagitis  Discussed risk of long term use of PPI and she is willing to try weaning self off medication - omeprazole (PRILOSEC) 20 MG capsule; Take 1 capsule (20 mg total) by mouth daily.  Dispense: 90 capsule; Refill: 0 - ranitidine (ZANTAC) 150 MG capsule; Take 1 capsule (150 mg total) by mouth daily. To alternate with omeprazole and try to wean self off  Dispense: 90 capsule; Refill: 0  4. Insomnia, persistent  - zolpidem (AMBIEN) 10 MG tablet; Take 0.5-1 tablets (5-10 mg total) by mouth at bedtime.  Dispense: 90 tablet; Refill: 0  5. Dysmetabolic syndrome  - metFORMIN (GLUCOPHAGE) 500 MG tablet; Take 1 tablet (500 mg total) by mouth daily.  Dispense: 90 tablet; Refill: 1  6. Generalized anxiety disorder  She is almost off Klonopin  7. Major depression, chronic ([facility])  Doing better - FLUoxetine (PROZAC) 20 MG capsule; Take 3 capsules (60 mg total) by mouth daily.  Dispense: 270 capsule; Refill: 1  8. Dyslipidemia  - Lipid panel - atorvastatin (LIPITOR) 40 MG tablet; Take 1 tablet (40 mg total) by mouth daily.  Dispense: 90 tablet; Refill:   9. Obesity (BMI 30.0-34.9)  Discussed with the patient the risk posed by an increased BMI. Discussed importance of portion control, calorie counting and at least 150 minutes of physical activity weekly. Avoid sweet beverages and drink more water. Eat at least 6 servings of fruit and vegetables daily   10. Chronic cervical pain  Improved  11. Hyperglycemia  Hemoglobin A1c  12. Long-term use of high-risk medication  - Comprehensive metabolic panel

## 2015-11-20 ENCOUNTER — Other Ambulatory Visit: Payer: Self-pay | Admitting: Family Medicine

## 2015-11-20 DIAGNOSIS — G4700 Insomnia, unspecified: Secondary | ICD-10-CM

## 2015-11-29 DIAGNOSIS — M5416 Radiculopathy, lumbar region: Secondary | ICD-10-CM | POA: Diagnosis not present

## 2015-11-29 DIAGNOSIS — M545 Low back pain: Secondary | ICD-10-CM | POA: Diagnosis not present

## 2015-11-29 DIAGNOSIS — M4806 Spinal stenosis, lumbar region: Secondary | ICD-10-CM | POA: Diagnosis not present

## 2015-11-29 DIAGNOSIS — M5136 Other intervertebral disc degeneration, lumbar region: Secondary | ICD-10-CM | POA: Diagnosis not present

## 2015-11-30 ENCOUNTER — Other Ambulatory Visit: Payer: Self-pay | Admitting: Family Medicine

## 2015-11-30 NOTE — Telephone Encounter (Signed)
Patient requesting refill of Omeprazole be sent to  [facility].

## 2015-12-01 ENCOUNTER — Other Ambulatory Visit: Payer: Self-pay | Admitting: Family Medicine

## 2015-12-04 DIAGNOSIS — M9901 Segmental and somatic dysfunction of cervical region: Secondary | ICD-10-CM | POA: Diagnosis not present

## 2015-12-04 DIAGNOSIS — G44219 Episodic tension-type headache, not intractable: Secondary | ICD-10-CM | POA: Diagnosis not present

## 2015-12-19 DIAGNOSIS — Z08 Encounter for follow-up examination after completed treatment for malignant neoplasm: Secondary | ICD-10-CM | POA: Diagnosis not present

## 2015-12-19 DIAGNOSIS — L821 Other seborrheic keratosis: Secondary | ICD-10-CM | POA: Diagnosis not present

## 2015-12-19 DIAGNOSIS — Z85828 Personal history of other malignant neoplasm of skin: Secondary | ICD-10-CM | POA: Diagnosis not present

## 2015-12-19 DIAGNOSIS — M71342 Other bursal cyst, left hand: Secondary | ICD-10-CM | POA: Diagnosis not present

## 2016-02-02 ENCOUNTER — Other Ambulatory Visit: Payer: Self-pay

## 2016-02-02 ENCOUNTER — Encounter: Payer: Self-pay | Admitting: Family Medicine

## 2016-02-02 ENCOUNTER — Ambulatory Visit (INDEPENDENT_AMBULATORY_CARE_PROVIDER_SITE_OTHER): Payer: PPO | Admitting: Family Medicine

## 2016-02-02 VITALS — BP 124/68 | HR 85 | Temp 98.1°F | Resp 16 | Ht 63.0 in | Wt 170.2 lb

## 2016-02-02 DIAGNOSIS — G4700 Insomnia, unspecified: Secondary | ICD-10-CM | POA: Diagnosis not present

## 2016-02-02 DIAGNOSIS — F329 Major depressive disorder, single episode, unspecified: Secondary | ICD-10-CM

## 2016-02-02 DIAGNOSIS — G8929 Other chronic pain: Secondary | ICD-10-CM

## 2016-02-02 DIAGNOSIS — G43009 Migraine without aura, not intractable, without status migrainosus: Secondary | ICD-10-CM

## 2016-02-02 DIAGNOSIS — F411 Generalized anxiety disorder: Secondary | ICD-10-CM | POA: Diagnosis not present

## 2016-02-02 DIAGNOSIS — M5441 Lumbago with sciatica, right side: Secondary | ICD-10-CM

## 2016-02-02 DIAGNOSIS — E785 Hyperlipidemia, unspecified: Secondary | ICD-10-CM

## 2016-02-02 DIAGNOSIS — K219 Gastro-esophageal reflux disease without esophagitis: Secondary | ICD-10-CM | POA: Diagnosis not present

## 2016-02-02 DIAGNOSIS — Z23 Encounter for immunization: Secondary | ICD-10-CM | POA: Diagnosis not present

## 2016-02-02 DIAGNOSIS — M542 Cervicalgia: Secondary | ICD-10-CM

## 2016-02-02 DIAGNOSIS — E8881 Metabolic syndrome: Secondary | ICD-10-CM

## 2016-02-02 MED ORDER — OXYCODONE-ACETAMINOPHEN 10-325 MG PO TABS: 1.0000 | ORAL_TABLET | ORAL | 0 refills | Status: DC | PRN

## 2016-02-02 MED ORDER — CYCLOBENZAPRINE HCL 10 MG PO TABS: 5.0000 mg | ORAL_TABLET | Freq: Every evening | ORAL | 0 refills | Status: DC

## 2016-02-02 MED ORDER — OXYCODONE-ACETAMINOPHEN 10-325 MG PO TABS: 1.0000 | ORAL_TABLET | Freq: Three times a day (TID) | ORAL | 0 refills | Status: DC | PRN

## 2016-02-02 MED ORDER — TRAZODONE HCL 50 MG PO TABS: 50.0000 mg | ORAL_TABLET | Freq: Every day | ORAL | 0 refills | Status: DC

## 2016-02-02 NOTE — Addendum Note (Signed)
Addended by: Lolita Rieger D on: 02/02/2016 02:38 PM   Modules accepted: Orders

## 2016-02-02 NOTE — Progress Notes (Signed)
Name: Jessica Burgess   MRN: [id number]    DOB: March 17, 1956   Date:02/02/2016       Progress Note  Subjective  Chief Complaint  Chief Complaint  Patient presents with  . Pain  . Depression  . Obesity  . Flu Vaccine    HPI  Dyslipidemia: she is taking fish oil, triglycerides getting worse again. Can't afford rx medication for triglycerides , she is doing well with dietary modification.   Chronic back pain; previous history of back surgery, still has daily pain. Took Fentanyl patch years ago but caused worsening of migraines. She has been taking Percocet for many years. Explained risk of concomitant use of Ambien, BZD's, Flexeril and opiates since they are all sedating and can cause respiratory suppression and death. She has been  weaning off Klonopin to take prn, last rx filled 06/2015  we will stop Ambien today and change to Trazodone. Still on Percocet - she will also try to go down to 5 of Flexeril.   Discussed replacing Percocet with long acting narcotics but she is afraid of changing at this time. She sees Dr. 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   Depression Major : taking medication daily, she is feeling overwhelmed carrying for her roommate, mood has improved since she had neck surgery her mood had improved, however she has been getting frustrated because she lost down to 162 lb but since halloween she has been eating candy again and is getting upset.   Metabolic Syndrome/Prediabetes: hgbA1C has gone up, she is on Metformin and life style modification. She denies polyphagia, polyuria or polydipsia.   Cervical spine stenosis and radiculitis: s/p neck surgery times 0 now - last one March 2017 by Dr. Arnoldo Morale. She is doing well now, no longer has tingling or numbness, daily headaches have resolved.   URI: she developed rhinorrhea and nasal  congestion last week, the left maxillary sinus was very painful last week, but doing better with otc sudafe and nose is draining again.   Migraine headache: she had to take imitrex because of recent URI, otherwise doing much better since neck surgery   Patient Active Problem List   Diagnosis Date Noted  . Chronic right-sided low back pain with right-sided sciatica 02/02/2016  . History of neck surgery 08/01/2015  . Hematoma 06/14/2015  . Perennial allergic rhinitis 05/05/2015  . Generalized anxiety disorder 02/03/2015  . Back pain, chronic 08/25/2014  . Insomnia, persistent 08/25/2014  . Chronic cervical pain 08/25/2014  . Major depression, chronic ([place]) 08/25/2014  . Dyslipidemia 08/25/2014  . Gastro-esophageal reflux disease without esophagitis 08/25/2014  . H/O high risk medication treatment 08/25/2014  . Blood glucose elevated 08/25/2014  . Migraine without aura and without status migrainosus, not intractable 08/25/2014  . Climacteric 08/25/2014  . Dysmetabolic syndrome 24/11/7351  . Fungal infection of toenail 08/25/2014  . Obesity (BMI 30.0-34.9) 08/25/2014  . Vitamin D deficiency 08/25/2014  . Engages in travel abroad 08/25/2014  . Cervical disc disorder with radiculopathy 04/28/2013    Past Surgical History:  Procedure Laterality Date  . ABDOMINAL HYSTERECTOMY    . ANTERIOR CERVICAL DECOMP/DISCECTOMY FUSION N/A 06/07/2015   Procedure: Cervical three - four and Cervical six- seven anterior cervical decompression with fusion interbody prosthesis plating and bonegraft;  Surgeon: Newman Pies, MD;  Location: [facility];  Service:  Neurosurgery;  Laterality: N/A;  C34 and C67 anterior cervical decompression with fusion interbody prosthesis plating and bonegraft  . BACK SURGERY     x2 Lower   . EVACUATION OF CERVICAL HEMATOMA N/A 06/14/2015   Procedure: EVACUATION OF CERVICAL HEMATOMA;  Surgeon: Newman Pies, MD;  Location: [facility];  Service: Neurosurgery;   Laterality: N/A;  . NECK SURGERY     x3  . TONSILLECTOMY      Family History  Problem Relation Age of Onset  . Depression Mother   . Migraines Mother   . Dementia Father   . Hyperlipidemia Brother   . Breast cancer Paternal Aunt     37s    Social History   Social History  . Marital status: Single    Spouse name: N/A  . Number of children: 0  . Years of education: 12+   Occupational History  .  Disabled  . Disabled     Social History Main Topics  . Smoking status: Former Smoker    Packs/day: 1.50    Years: 20.00    Types: Cigarettes    Start date: 03/19/1979    Quit date: 08/25/1999  . Smokeless tobacco: Never Used  . Alcohol use 0.0 oz/week     Comment: wine occassionally  . Drug use: No  . Sexual activity: No   Other Topics Concern  . Not on file   Social History Narrative   Patient is single.    Patient lives with roommates.    Patient on disability    Patient has no children.    Patient has some college      Current Outpatient Prescriptions:  .  albuterol (PROVENTIL) (2.5 MG/3ML) 0.083% nebulizer solution, Take 3 mLs (2.5 mg total) by nebulization every 6 (six) hours as needed for wheezing or shortness of breath., Disp: 75 mL, Rfl: 2 .  Ascorbic Acid (VITAMIN C PO), Take by mouth., Disp: , Rfl:  .  atorvastatin (LIPITOR) 40 MG tablet, Take 1 tablet (40 mg total) by mouth daily., Disp: 90 tablet, Rfl: 4 .  Biotin 2500 MCG CAPS, Take 1 tablet by mouth daily. , Disp: , Rfl:  .  Cholecalciferol (VITAMIN D-1000 MAX ST) 1000 UNITS tablet, Take 1 tablet by mouth 2 (two) times daily., Disp: , Rfl:  .  clonazePAM (KLONOPIN) 0.5 MG tablet, take 1 tablet by mouth once daily if needed for anxiety (Patient taking differently: Take 0.5 mg by mouth 3 (three) times daily as needed for anxiety. ), Disp: 30 tablet, Rfl: 2 .  Cyanocobalamin (B-12 PO), Take 1 tablet by mouth daily. , Disp: , Rfl:  .  cyclobenzaprine (FLEXERIL) 10 MG tablet, Take 0.5-1 tablets (5-10 mg total)  by mouth every evening., Disp: 90 tablet, Rfl: 0 .  docusate sodium (COLACE) 100 MG capsule, Take 1 capsule (100 mg total) by mouth 2 (two) times daily., Disp: 60 capsule, Rfl: 0 .  estradiol (ESTRACE) 0.5 MG tablet, take 1 tablet by mouth once daily, Disp: 90 tablet, Rfl: 4 .  ferrous sulfate (IRON SUPPLEMENT) 325 (65 FE) MG tablet, Take 1 tablet by mouth daily., Disp: , Rfl:  .  FLUoxetine (PROZAC) 20 MG capsule, Take 3 capsules (60 mg total) by mouth daily., Disp: 270 capsule, Rfl: 1 .  fluticasone (FLONASE) 50 MCG/ACT nasal spray, Place 2 sprays into both nostrils as needed. (Patient taking differently: Place 2 sprays into both nostrils as needed for allergies. ), Disp: 16 g, Rfl: 5 .  loratadine (CLARITIN) 10  MG tablet, Take 1 tablet (10 mg total) by mouth 2 (two) times daily., Disp: 180 tablet, Rfl: 1 .  Magnesium Oxide 500 MG CAPS, Take 500 mg by mouth 2 (two) times daily. , Disp: , Rfl:  .  metFORMIN (GLUCOPHAGE) 500 MG tablet, Take 1 tablet (500 mg total) by mouth daily., Disp: 90 tablet, Rfl: 1 .  Multiple Vitamins-Minerals (MULTIVITAMIN PO), Take 1 tablet by mouth daily. , Disp: , Rfl:  .  Omega-3 Fatty Acids (FISH OIL PO), Take by mouth., Disp: , Rfl:  .  omeprazole (PRILOSEC) 20 MG capsule, Take 1 capsule (20 mg total) by mouth daily., Disp: 90 capsule, Rfl: 0 .  oxyCODONE-acetaminophen (PERCOCET) 10-325 MG tablet, Take 1 tablet by mouth every 4 (four) hours as needed for pain., Disp: 100 tablet, Rfl: 0 .  oxyCODONE-acetaminophen (PERCOCET) 10-325 MG tablet, Take 1 tablet by mouth every 4 (four) hours as needed for pain., Disp: 100 tablet, Rfl: 0 .  oxyCODONE-acetaminophen (PERCOCET) 10-325 MG tablet, Take 1 tablet by mouth every 8 (eight) hours as needed for pain., Disp: 100 tablet, Rfl: 0 .  potassium gluconate 595 MG TABS tablet, Take 595 mg by mouth daily., Disp: , Rfl:  .  ranitidine (ZANTAC) 150 MG capsule, Take 1 capsule (150 mg total) by mouth daily. To alternate with omeprazole  and try to wean self off, Disp: 90 capsule, Rfl: 0 .  SUMAtriptan (IMITREX) 100 MG tablet, take 1 tablet by mouth if needed for MIGRAINES AS DIRECTED, Disp: 27 tablet, Rfl: 1 .  traZODone (DESYREL) 50 MG tablet, Take 1-2 tablets (50-100 mg total) by mouth at bedtime., Disp: 60 tablet, Rfl: 0  No Known Allergies   ROS  Constitutional: Negative for fever or weight change.  Respiratory: Negative for cough and shortness of breath.   Cardiovascular: Negative for chest pain or palpitations.  Gastrointestinal: Negative for abdominal pain, no bowel changes.  Musculoskeletal: Negative for gait problem or joint swelling.  Skin: Negative for rash.  Neurological: Negative for dizziness, positive for headache.  No other specific complaints in a complete review of systems (except as listed in HPI above).  Objective  Vitals:   02/02/16 1321  BP: 124/68  Pulse: 85  Resp: 16  Temp: 98.1 F (36.7 C)  SpO2: 95%  Weight: 170 lb 3 oz (77.2 kg)  Height: 5\' 3"  (1.6 m)    Body mass index is 30.15 kg/m.  Physical Exam  Constitutional: Patient appears well-developed and well-nourished. Obese No distress.  HEENT: head atraumatic, normocephalic, pupils equal and reactive to light, throat within normal limits Cardiovascular: Normal rate, regular rhythm and normal heart sounds.  No murmur heard. No BLE edema. Pulmonary/Chest: Effort normal and breath sounds normal. No respiratory distress. Abdominal: Soft.  There is no tenderness. Psychiatric: Patient has a normal mood and affect. behavior is normal. Judgment and thought content normal. Muscular Skeletal: pain during palpation of lumbar spine, paraspinal muscles and also on left lower back. Negative straight leg raise, she has decrease rom of neck from previous neck surgery ( fusions )  PHQ2/9: Depression screen [facility] 2/9 02/02/2016 11/01/2015 08/01/2015 05/05/2015 02/03/2015  Decreased Interest 1 0 0 0 0  Down, Depressed, Hopeless 1 0 0 0 0  PHQ - 2  Score 2 0 0 0 0  Altered sleeping 0 - - - -  Tired, decreased energy 1 - - - -  Change in appetite 0 - - - -  Feeling bad or failure about yourself  0 - - - -  Trouble concentrating 0 - - - -  Moving slowly or fidgety/restless 0 - - - -  Suicidal thoughts 0 - - - -  PHQ-9 Score 3 - - - -  Difficult doing work/chores Not difficult at all - - - -     Fall Risk: Fall Risk  02/02/2016 08/01/2015 05/05/2015 02/03/2015 11/25/2014  Falls in the past year? Yes No No No Yes  Number falls in past yr: 2 or more - - - 2 or more  Injury with Fall? No - - - No  Follow up Falls evaluation completed - - - -      Functional Status Survey: Is the patient deaf or have difficulty hearing?: No Does the patient have difficulty seeing, even when wearing glasses/contacts?: No Does the patient have difficulty concentrating, remembering, or making decisions?: No Does the patient have difficulty walking or climbing stairs?: No Does the patient have difficulty dressing or bathing?: No Does the patient have difficulty doing errands alone such as visiting a doctor's office or shopping?: No    Assessment & Plan  1. Migraine without aura and without status migrainosus, not intractable  Doing well   2. Gastro-esophageal reflux disease without esophagitis   stable  3. Insomnia, persistent  We will stop Ambien  - traZODone (DESYREL) 50 MG tablet; Take 1-2 tablets (50-100 mg total) by mouth at bedtime.  Dispense: 60 tablet; Refill: 0  4. Dysmetabolic syndrome  Continue life style modification   5. Generalized anxiety disorder  stable  6. Major depression, chronic  stable  7. Dyslipidemia  On supplements  8. Chronic cervical pain  Doing well since surgery   9. Chronic right-sided low back pain with right-sided sciatica  Taking flexeril prn only, still needs percocet, we will try to go down to 95 pills per month - cyclobenzaprine (FLEXERIL) 10 MG tablet; Take 0.5-1 tablets (5-10 mg  total) by mouth every evening.  Dispense: 90 tablet; Refill: 0 - oxyCODONE-acetaminophen (PERCOCET) 10-325 MG tablet; Take 1 tablet by mouth every 4 (four) hours as needed for pain.  Dispense: 100 tablet; Refill: 0 - oxyCODONE-acetaminophen (PERCOCET) 10-325 MG tablet; Take 1 tablet by mouth every 4 (four) hours as needed for pain.  Dispense: 100 tablet; Refill: 0 - oxyCODONE-acetaminophen (PERCOCET) 10-325 MG tablet; Take 1 tablet by mouth every 8 (eight) hours as needed for pain.  Dispense: 100 tablet; Refill: 0

## 2016-02-29 ENCOUNTER — Other Ambulatory Visit: Payer: Self-pay | Admitting: Family Medicine

## 2016-02-29 DIAGNOSIS — G4700 Insomnia, unspecified: Secondary | ICD-10-CM

## 2016-02-29 MED ORDER — TRAZODONE HCL 50 MG PO TABS: 50.0000 mg | ORAL_TABLET | Freq: Every day | ORAL | 0 refills | Status: DC

## 2016-03-01 ENCOUNTER — Telehealth: Payer: Self-pay

## 2016-03-01 NOTE — Telephone Encounter (Signed)
Per the request of Dr. Steele Sizer, I called in the rx written for Trazadone 50mg  to [facility]. Spoke to [employer] manager)  I then called the patient and apologized for the inconvenience.

## 2016-03-04 ENCOUNTER — Other Ambulatory Visit: Payer: Self-pay

## 2016-03-04 ENCOUNTER — Other Ambulatory Visit: Payer: Self-pay | Admitting: Family Medicine

## 2016-03-04 MED ORDER — ZOLPIDEM TARTRATE 5 MG PO TABS: 5.0000 mg | ORAL_TABLET | Freq: Every evening | ORAL | 0 refills | Status: DC | PRN

## 2016-03-04 MED ORDER — ZOLPIDEM TARTRATE 10 MG PO TABS: 5.0000 mg | ORAL_TABLET | Freq: Every evening | ORAL | 0 refills | Status: DC | PRN

## 2016-03-04 NOTE — Telephone Encounter (Signed)
Pt was instructed to stop taking flexeril and clonazapam in order to take ambien 10 mg for sleep

## 2016-03-22 DIAGNOSIS — M542 Cervicalgia: Secondary | ICD-10-CM | POA: Diagnosis not present

## 2016-03-22 DIAGNOSIS — R030 Elevated blood-pressure reading, without diagnosis of hypertension: Secondary | ICD-10-CM | POA: Diagnosis not present

## 2016-03-22 DIAGNOSIS — M545 Low back pain: Secondary | ICD-10-CM | POA: Diagnosis not present

## 2016-03-22 DIAGNOSIS — Z6830 Body mass index (BMI) 30.0-30.9, adult: Secondary | ICD-10-CM | POA: Diagnosis not present

## 2016-04-02 ENCOUNTER — Other Ambulatory Visit: Payer: Self-pay | Admitting: Family Medicine

## 2016-04-05 ENCOUNTER — Other Ambulatory Visit: Payer: Self-pay | Admitting: Family Medicine

## 2016-04-05 NOTE — Telephone Encounter (Signed)
Patient requesting refill of Ambien to [facility].

## 2016-04-27 ENCOUNTER — Other Ambulatory Visit: Payer: Self-pay | Admitting: Family Medicine

## 2016-05-06 ENCOUNTER — Ambulatory Visit (INDEPENDENT_AMBULATORY_CARE_PROVIDER_SITE_OTHER): Payer: PPO | Admitting: Family Medicine

## 2016-05-06 ENCOUNTER — Encounter: Payer: Self-pay | Admitting: Family Medicine

## 2016-05-06 DIAGNOSIS — R7309 Other abnormal glucose: Secondary | ICD-10-CM

## 2016-05-06 DIAGNOSIS — E785 Hyperlipidemia, unspecified: Secondary | ICD-10-CM

## 2016-05-06 DIAGNOSIS — M9902 Segmental and somatic dysfunction of thoracic region: Secondary | ICD-10-CM | POA: Diagnosis not present

## 2016-05-06 DIAGNOSIS — Z79899 Other long term (current) drug therapy: Secondary | ICD-10-CM | POA: Diagnosis not present

## 2016-05-06 DIAGNOSIS — J3089 Other allergic rhinitis: Secondary | ICD-10-CM

## 2016-05-06 DIAGNOSIS — M542 Cervicalgia: Secondary | ICD-10-CM | POA: Diagnosis not present

## 2016-05-06 DIAGNOSIS — F329 Major depressive disorder, single episode, unspecified: Secondary | ICD-10-CM | POA: Diagnosis not present

## 2016-05-06 DIAGNOSIS — E8881 Metabolic syndrome: Secondary | ICD-10-CM

## 2016-05-06 DIAGNOSIS — Z9889 Other specified postprocedural states: Secondary | ICD-10-CM

## 2016-05-06 DIAGNOSIS — G4700 Insomnia, unspecified: Secondary | ICD-10-CM | POA: Diagnosis not present

## 2016-05-06 DIAGNOSIS — M5441 Lumbago with sciatica, right side: Secondary | ICD-10-CM | POA: Diagnosis not present

## 2016-05-06 DIAGNOSIS — G43009 Migraine without aura, not intractable, without status migrainosus: Secondary | ICD-10-CM | POA: Diagnosis not present

## 2016-05-06 DIAGNOSIS — M546 Pain in thoracic spine: Secondary | ICD-10-CM | POA: Diagnosis not present

## 2016-05-06 DIAGNOSIS — G8929 Other chronic pain: Secondary | ICD-10-CM

## 2016-05-06 DIAGNOSIS — F411 Generalized anxiety disorder: Secondary | ICD-10-CM

## 2016-05-06 MED ORDER — FLUTICASONE PROPIONATE 50 MCG/ACT NA SUSP: 2.0000 | NASAL | 5 refills | Status: DC | PRN

## 2016-05-06 MED ORDER — FLUOXETINE HCL 20 MG PO CAPS: 60.0000 mg | ORAL_CAPSULE | Freq: Every day | ORAL | 1 refills | Status: DC

## 2016-05-06 MED ORDER — ZOLPIDEM TARTRATE 10 MG PO TABS: ORAL_TABLET | ORAL | 2 refills | Status: DC

## 2016-05-06 MED ORDER — OXYCODONE-ACETAMINOPHEN 10-325 MG PO TABS: 1.0000 | ORAL_TABLET | Freq: Three times a day (TID) | ORAL | 0 refills | Status: DC | PRN

## 2016-05-06 MED ORDER — OXYCODONE-ACETAMINOPHEN 10-325 MG PO TABS: 1.0000 | ORAL_TABLET | ORAL | 0 refills | Status: DC | PRN

## 2016-05-06 MED ORDER — GABAPENTIN 100 MG PO CAPS: 300.0000 mg | ORAL_CAPSULE | Freq: Three times a day (TID) | ORAL | 0 refills | Status: DC

## 2016-05-06 NOTE — Progress Notes (Addendum)
Name: Jessica Burgess   MRN: [id number]    DOB: January 15, 1956   Date:05/06/2016       Progress Note  Subjective  Chief Complaint  Chief Complaint  Patient presents with  . Medication Refill    3 month F/U  . Anxiety    Has been highly lately due to stress and losing a love one recently  . Migraine    December and January was horrible for patient, having them daily these past two months  . Back Pain    Worsen due to weather change  . Insomnia    Getting better with new vitamin supplement of the CBD.  Marland Kitchen Hyperlipidemia    Denies and symptoms    HPI  Dyslipidemia: she is taking fish oil, triglycerides getting worse again. Can't afford rx medication for triglycerides , she is doing well with dietary modification. We will recheck labs.   Chronic back pain; previous history of back surgery, still has daily pain. Took Fentanyl patch years ago but caused worsening of migraines. She has been taking Percocet for many years. Explained risk of concomitant use of Ambien, BZD's, Flexeril and opiates since they are all sedating and can cause respiratory suppression and death. She has been weaning off Klonopin to take prn, last rx filled 06/2015 we tried stopping Ambien and switched to Trazodone, but she was unable to sleep for days. So she decided to stop Klonopin and go back on Ambien instead. She is still on Percocet . Discussed replacing Percocet with long acting narcotics but she is afraid of changing at this time. She sees Dr. Arnoldo Burgess, and had recent MRI, Pain today is 5/10  Insomnia: takes Zolpidem, 10 mg daily, unable to tolerated Trazodone. She was stressed because her roommate Jessica Burgess was sick    Depression Major : she is doing better, her friend and roommate Jessica Burgess died and the adjustment has been strange, taking medication as prescribed.   Metabolic Syndrome/Prediabetes: hgbA1C has gone up, she is on Metformin and life style modification. She denies polyphagia, polyuria or polydipsia.  We will recheck labs.  Cervical spine stenosis and radiculitis: s/p neck ( last surgery Dr. Arnoldo Burgess March 2017 ) surgery was zero at times, but it is getting worse again, aching like pain. She is doing well now, no longer has tingling or numbness. She states headaches are back.   Migraine headache: she was doing well after neck surgery, but states symptoms are getting worse again, up to one migraine episode per day. She is taking Imitrex at least 9 per month. She is willing to go back to headache center for further evaluation    Patient Active Problem List   Diagnosis Date Noted  . Chronic right-sided low back pain with right-sided sciatica 02/02/2016  . History of neck surgery 08/01/2015  . Hematoma 06/14/2015  . Perennial allergic rhinitis 05/05/2015  . Generalized anxiety disorder 02/03/2015  . Back pain, chronic 08/25/2014  . Insomnia, persistent 08/25/2014  . Chronic cervical pain 08/25/2014  . Major depression, chronic ([place]) 08/25/2014  . Dyslipidemia 08/25/2014  . Gastro-esophageal reflux disease without esophagitis 08/25/2014  . H/O high risk medication treatment 08/25/2014  . Blood glucose elevated 08/25/2014  . Migraine without aura and without status migrainosus, not intractable 08/25/2014  . Climacteric 08/25/2014  . Dysmetabolic syndrome 58/85/0277  . Fungal infection of toenail 08/25/2014  . Obesity (BMI 30.0-34.9) 08/25/2014  . Vitamin D deficiency 08/25/2014  . Engages in travel abroad 08/25/2014  . Cervical disc disorder with radiculopathy 04/28/2013  Past Surgical History:  Procedure Laterality Date  . ABDOMINAL HYSTERECTOMY    . ANTERIOR CERVICAL DECOMP/DISCECTOMY FUSION N/A 06/07/2015   Procedure: Cervical three - four and Cervical six- seven anterior cervical decompression with fusion interbody prosthesis plating and bonegraft;  Surgeon: Jessica Pies, MD;  Location: [facility];  Service: Neurosurgery;  Laterality: N/A;  C34 and C67 anterior cervical  decompression with fusion interbody prosthesis plating and bonegraft  . BACK SURGERY     x2 Lower   . EVACUATION OF CERVICAL HEMATOMA N/A 06/14/2015   Procedure: EVACUATION OF CERVICAL HEMATOMA;  Surgeon: Jessica Pies, MD;  Location: [facility];  Service: Neurosurgery;  Laterality: N/A;  . NECK SURGERY     x3  . TONSILLECTOMY      Family History  Problem Relation Age of Onset  . Depression Mother   . Migraines Mother   . Dementia Father   . Hyperlipidemia Brother   . Breast cancer Paternal Aunt     27s    Social History   Social History  . Marital status: Single    Spouse name: N/A  . Number of children: 0  . Years of education: 12+   Occupational History  .  Disabled  . Disabled     Social History Main Topics  . Smoking status: Former Smoker    Packs/day: 1.50    Years: 20.00    Types: Cigarettes    Start date: 03/19/1979    Quit date: 08/25/1999  . Smokeless tobacco: Never Used  . Alcohol use 0.0 oz/week     Comment: wine occassionally  . Drug use: No  . Sexual activity: No   Other Topics Concern  . Not on file   Social History Narrative   Patient is single.    Patient lives with roommates.    Patient on disability    Patient has no children.    Patient has some college      Current Outpatient Prescriptions:  .  albuterol (PROVENTIL) (2.5 MG/3ML) 0.083% nebulizer solution, Take 3 mLs (2.5 mg total) by nebulization every 6 (six) hours as needed for wheezing or shortness of breath., Disp: 75 mL, Rfl: 2 .  Ascorbic Acid (VITAMIN C PO), Take by mouth., Disp: , Rfl:  .  atorvastatin (LIPITOR) 40 MG tablet, Take 1 tablet (40 mg total) by mouth daily., Disp: 90 tablet, Rfl: 4 .  Biotin 2500 MCG CAPS, Take 1 tablet by mouth daily. , Disp: , Rfl:  .  Cholecalciferol (VITAMIN D-1000 MAX ST) 1000 UNITS tablet, Take 1 tablet by mouth 2 (two) times daily., Disp: , Rfl:  .  Cyanocobalamin (B-12 PO), Take 1 tablet by mouth daily. , Disp: , Rfl:  .  docusate sodium  (COLACE) 100 MG capsule, Take 1 capsule (100 mg total) by mouth 2 (two) times daily., Disp: 60 capsule, Rfl: 0 .  estradiol (ESTRACE) 0.5 MG tablet, take 1 tablet by mouth once daily, Disp: 90 tablet, Rfl: 4 .  ferrous sulfate (IRON SUPPLEMENT) 325 (65 FE) MG tablet, Take 1 tablet by mouth daily., Disp: , Rfl:  .  FLUoxetine (PROZAC) 20 MG capsule, Take 3 capsules (60 mg total) by mouth daily., Disp: 270 capsule, Rfl: 1 .  fluticasone (FLONASE) 50 MCG/ACT nasal spray, Place 2 sprays into both nostrils as needed., Disp: 16 g, Rfl: 5 .  loratadine (CLARITIN) 10 MG tablet, Take 1 tablet (10 mg total) by mouth 2 (two) times daily., Disp: 180 tablet, Rfl: 1 .  Magnesium  Oxide 500 MG CAPS, Take 500 mg by mouth 2 (two) times daily. , Disp: , Rfl:  .  metFORMIN (GLUCOPHAGE) 500 MG tablet, Take 1 tablet (500 mg total) by mouth daily., Disp: 90 tablet, Rfl: 1 .  Multiple Vitamins-Minerals (MULTIVITAMIN PO), Take 1 tablet by mouth daily. , Disp: , Rfl:  .  NON FORMULARY, Take 1 capsule by mouth Nightly. CBD-Hemp Derived Cannabidiol for Sleep and CBD-XRP Dietary Supplement, Disp: , Rfl:  .  Omega-3 Fatty Acids (FISH OIL PO), Take by mouth., Disp: , Rfl:  .  omeprazole (PRILOSEC) 20 MG capsule, Take 1 capsule (20 mg total) by mouth daily., Disp: 90 capsule, Rfl: 0 .  oxyCODONE-acetaminophen (PERCOCET) 10-325 MG tablet, Take 1 tablet by mouth every 8 (eight) hours as needed for pain., Disp: 84 tablet, Rfl: 0 .  oxyCODONE-acetaminophen (PERCOCET) 10-325 MG tablet, Take 1 tablet by mouth every 8 (eight) hours as needed for pain., Disp: 84 tablet, Rfl: 0 .  oxyCODONE-acetaminophen (PERCOCET) 10-325 MG tablet, Take 1 tablet by mouth every 4 (four) hours as needed for pain. Fill April 25th, 2018, Disp: 84 tablet, Rfl: 0 .  potassium gluconate 595 MG TABS tablet, Take 595 mg by mouth daily., Disp: , Rfl:  .  ranitidine (ZANTAC) 150 MG capsule, Take 1 capsule (150 mg total) by mouth daily. To alternate with omeprazole  and try to wean self off, Disp: 90 capsule, Rfl: 0 .  SUMAtriptan (IMITREX) 100 MG tablet, take 1 tablet by mouth if needed for migraines as directed, Disp: 27 tablet, Rfl: 1 .  zolpidem (AMBIEN) 10 MG tablet, take 1/2 to 1 tablet by mouth at bedtime if needed for sleep, Disp: 30 tablet, Rfl: 2  No Known Allergies   ROS  Constitutional: Negative for fever or weight change.  Respiratory: Negative for cough and shortness of breath.   Cardiovascular: Negative for chest pain or palpitations.  Gastrointestinal: Negative for abdominal pain, no bowel changes.  Musculoskeletal: Negative for gait problem or joint swelling.  Skin: Negative for rash.  Neurological: Negative for dizziness, positive for  headache.  No other specific complaints in a complete review of systems (except as listed in HPI above).  Objective  Vitals:   05/06/16 1352  BP: 118/64  Pulse: 92  Resp: 16  Temp: 98.1 F (36.7 C)  TempSrc: Oral  SpO2: 97%  Weight: 172 lb 11.2 oz (78.3 kg)  Height: 5\' 3"  (1.6 m)    Body mass index is 30.59 kg/m.  Physical Exam  Constitutional: Patient appears well-developed and well-nourished. Obese No distress.  HEENT: head atraumatic, normocephalic, pupils equal and reactive to light, ears TM normal, neck supple, throat within normal limits Cardiovascular: Normal rate, regular rhythm and normal heart sounds.  No murmur heard. No BLE edema. Pulmonary/Chest: Effort normal and breath sounds normal. No respiratory distress. Abdominal: Soft.  There is no tenderness. Psychiatric: Patient has a normal mood and affect. behavior is normal. Judgment and thought content normal. Muscular Skeletal: pain during palpation of lumbar spine, neck is supple but she has pain during palpation of nuchal area. Pain during palpation of thoracic spine  PHQ2/9: Depression screen [facility] 2/9 05/06/2016 02/02/2016 11/01/2015 08/01/2015 05/05/2015  Decreased Interest 0 1 0 0 0  Down, Depressed, Hopeless 0 1 0 0 0   PHQ - 2 Score 0 2 0 0 0  Altered sleeping - 0 - - -  Tired, decreased energy - 1 - - -  Change in appetite - 0 - - -  Feeling bad or failure about yourself  - 0 - - -  Trouble concentrating - 0 - - -  Moving slowly or fidgety/restless - 0 - - -  Suicidal thoughts - 0 - - -  PHQ-9 Score - 3 - - -  Difficult doing work/chores - Not difficult at all - - -     Fall Risk: Fall Risk  05/06/2016 02/02/2016 08/01/2015 05/05/2015 02/03/2015  Falls in the past year? No Yes No No No  Number falls in past yr: - 2 or more - - -  Injury with Fall? - No - - -  Follow up - Falls evaluation completed - - -      Functional Status Survey: Is the patient deaf or have difficulty hearing?: No Does the patient have difficulty seeing, even when wearing glasses/contacts?: No Does the patient have difficulty concentrating, remembering, or making decisions?: No Does the patient have difficulty walking or climbing stairs?: No Does the patient have difficulty dressing or bathing?: No Does the patient have difficulty doing errands alone such as visiting a doctor's office or shopping?: No   Assessment & Plan  1. Migraine without aura and without status migrainosus, not intractable  - AMB referral to headache clinic  - gabapentin (NEURONTIN) 100 MG capsule; Take 3 capsules (300 mg total) by mouth 3 (three) times daily. Titrate to up to 300 mg three times daily  Dispense: 90 capsule; Refill: 0   2. Dyslipidemia  - Lipid panel  3. Insomnia, persistent  - zolpidem (AMBIEN) 10 MG tablet; take 1/2 to 1 tablet by mouth at bedtime if needed for sleep  Dispense: 30 tablet; Refill: 2  4. Major depression, chronic  - FLUoxetine (PROZAC) 20 MG capsule; Take 3 capsules (60 mg total) by mouth daily.  Dispense: 270 capsule; Refill: 1  5. Chronic bilateral low back pain with right-sided sciatica  Pain contract signed and drug screen done 10/2015  6. History of neck surgery  Still has pain, but not as  intense  7. Generalized anxiety disorder  - FLUoxetine (PROZAC) 20 MG capsule; Take 3 capsules (60 mg total) by mouth daily.  Dispense: 270 capsule; Refill: 1  8. Dysmetabolic syndrome  Recheck labs  9. Chronic cervical pain  - oxyCODONE-acetaminophen (PERCOCET) 10-325 MG tablet; Take 1 tablet by mouth every 8 (eight) hours as needed for pain.  Dispense: 84 tablet; Refill: 0 - oxyCODONE-acetaminophen (PERCOCET) 10-325 MG tablet; Take 1 tablet by mouth every 8 (eight) hours as needed for pain.  Dispense: 84 tablet; Refill: 0 - oxyCODONE-acetaminophen (PERCOCET) 10-325 MG tablet; Take 1 tablet by mouth every 4 (four) hours as needed for pain. Fill April 25th, 2018  Dispense: 84 tablet; Refill: 0  10. Chronic right-sided low back pain with right-sided sciatica  - oxyCODONE-acetaminophen (PERCOCET) 10-325 MG tablet; Take 1 tablet by mouth every 8 (eight) hours as needed for pain.  Dispense: 84 tablet; Refill: 0 - oxyCODONE-acetaminophen (PERCOCET) 10-325 MG tablet; Take 1 tablet by mouth every 8 (eight) hours as needed for pain.  Dispense: 84 tablet; Refill: 0 - oxyCODONE-acetaminophen (PERCOCET) 10-325 MG tablet; Take 1 tablet by mouth every 4 (four) hours as needed for pain. Fill April 25th, 2018  Dispense: 84 tablet; Refill: 0  11. Perennial allergic rhinitis  - fluticasone (FLONASE) 50 MCG/ACT nasal spray; Place 2 sprays into both nostrils as needed.  Dispense: 16 g; Refill: 5  12. High glucose  - Hemoglobin A1c - Insulin, fasting  13. Long-term use of high-risk medication  -  COMPLETE METABOLIC PANEL WITH GFR

## 2016-05-17 DIAGNOSIS — R7309 Other abnormal glucose: Secondary | ICD-10-CM | POA: Diagnosis not present

## 2016-05-17 DIAGNOSIS — E785 Hyperlipidemia, unspecified: Secondary | ICD-10-CM | POA: Diagnosis not present

## 2016-05-17 DIAGNOSIS — Z79899 Other long term (current) drug therapy: Secondary | ICD-10-CM | POA: Diagnosis not present

## 2016-05-17 LAB — LIPID PANEL
CHOL/HDL RATIO: 2.7 ratio (ref <5.0)
CHOLESTEROL: 131 mg/dL (ref <200)
HDL: 48 mg/dL — AB (ref >50)
LDL Cholesterol: 49 mg/dL (ref <100)
Triglycerides: 171 mg/dL — ABNORMAL HIGH (ref <150)
VLDL: 34 mg/dL — ABNORMAL HIGH (ref <30)

## 2016-05-17 LAB — COMPLETE METABOLIC PANEL WITH GFR
ALBUMIN: 4 g/dL (ref 3.6–5.1)
ALK PHOS: 70 U/L (ref 33–130)
ALT: 32 U/L — ABNORMAL HIGH (ref 6–29)
AST: 20 U/L (ref 10–35)
BUN: 16 mg/dL (ref 7–25)
CALCIUM: 9.1 mg/dL (ref 8.6–10.4)
CO2: 30 mmol/L (ref 20–31)
CREATININE: 0.9 mg/dL (ref 0.50–0.99)
Chloride: 103 mmol/L (ref 98–110)
GFR, Est African American: 80 mL/min (ref >=60)
GFR, Est Non African American: 69 mL/min (ref >=60)
GLUCOSE: 100 mg/dL — AB (ref 65–99)
Potassium: 5.1 mmol/L (ref 3.5–5.3)
SODIUM: 140 mmol/L (ref 135–146)
Total Bilirubin: 0.5 mg/dL (ref 0.2–1.2)
Total Protein: 6.4 g/dL (ref 6.1–8.1)

## 2016-05-18 LAB — INSULIN, FASTING: Insulin fasting, serum: 7.6 u[IU]/mL (ref 2.0–19.6)

## 2016-05-18 LAB — HEMOGLOBIN A1C
HEMOGLOBIN A1C: 5.4 % (ref <5.7)
Mean Plasma Glucose: 108 mg/dL

## 2016-06-22 ENCOUNTER — Other Ambulatory Visit: Payer: Self-pay | Admitting: Family Medicine

## 2016-06-24 NOTE — Telephone Encounter (Signed)
Patient requesting refill of Imitrex to [facility].

## 2016-06-26 DIAGNOSIS — M9901 Segmental and somatic dysfunction of cervical region: Secondary | ICD-10-CM | POA: Diagnosis not present

## 2016-06-26 DIAGNOSIS — G44219 Episodic tension-type headache, not intractable: Secondary | ICD-10-CM | POA: Diagnosis not present

## 2016-06-26 DIAGNOSIS — M542 Cervicalgia: Secondary | ICD-10-CM | POA: Diagnosis not present

## 2016-07-24 ENCOUNTER — Encounter: Payer: Self-pay | Admitting: Family Medicine

## 2016-07-24 ENCOUNTER — Ambulatory Visit (INDEPENDENT_AMBULATORY_CARE_PROVIDER_SITE_OTHER): Payer: PPO | Admitting: Family Medicine

## 2016-07-24 VITALS — BP 130/76 | HR 82 | Temp 98.6°F | Resp 16 | Ht 63.0 in | Wt 169.6 lb

## 2016-07-24 DIAGNOSIS — G43009 Migraine without aura, not intractable, without status migrainosus: Secondary | ICD-10-CM

## 2016-07-24 DIAGNOSIS — E8881 Metabolic syndrome: Secondary | ICD-10-CM | POA: Diagnosis not present

## 2016-07-24 DIAGNOSIS — F411 Generalized anxiety disorder: Secondary | ICD-10-CM

## 2016-07-24 DIAGNOSIS — G8929 Other chronic pain: Secondary | ICD-10-CM | POA: Diagnosis not present

## 2016-07-24 DIAGNOSIS — M5441 Lumbago with sciatica, right side: Secondary | ICD-10-CM | POA: Diagnosis not present

## 2016-07-24 DIAGNOSIS — M542 Cervicalgia: Secondary | ICD-10-CM | POA: Diagnosis not present

## 2016-07-24 DIAGNOSIS — E785 Hyperlipidemia, unspecified: Secondary | ICD-10-CM

## 2016-07-24 DIAGNOSIS — F329 Major depressive disorder, single episode, unspecified: Secondary | ICD-10-CM

## 2016-07-24 DIAGNOSIS — F341 Dysthymic disorder: Secondary | ICD-10-CM

## 2016-07-24 DIAGNOSIS — N841 Polyp of cervix uteri: Secondary | ICD-10-CM

## 2016-07-24 DIAGNOSIS — G4700 Insomnia, unspecified: Secondary | ICD-10-CM | POA: Diagnosis not present

## 2016-07-24 DIAGNOSIS — E669 Obesity, unspecified: Secondary | ICD-10-CM | POA: Diagnosis not present

## 2016-07-24 MED ORDER — METFORMIN HCL 500 MG PO TABS: 500.0000 mg | ORAL_TABLET | Freq: Every day | ORAL | 1 refills | Status: DC

## 2016-07-24 MED ORDER — GABAPENTIN 100 MG PO CAPS: 300.0000 mg | ORAL_CAPSULE | Freq: Three times a day (TID) | ORAL | 0 refills | Status: DC

## 2016-07-24 MED ORDER — DULOXETINE HCL 30 MG PO CPEP: 30.0000 mg | ORAL_CAPSULE | Freq: Every day | ORAL | 0 refills | Status: DC

## 2016-07-24 MED ORDER — OXYCODONE-ACETAMINOPHEN 10-325 MG PO TABS: 1.0000 | ORAL_TABLET | ORAL | 0 refills | Status: DC | PRN

## 2016-07-24 MED ORDER — ZOLPIDEM TARTRATE ER 6.25 MG PO TBCR: 6.2500 mg | EXTENDED_RELEASE_TABLET | Freq: Every day | ORAL | 0 refills | Status: DC

## 2016-07-24 MED ORDER — OXYCODONE-ACETAMINOPHEN 10-325 MG PO TABS: 1.0000 | ORAL_TABLET | Freq: Three times a day (TID) | ORAL | 0 refills | Status: DC | PRN

## 2016-07-24 NOTE — Patient Instructions (Signed)
Go down to 40 mg of Prozac for the next two days, after that go down to one prozac and one capsule of Duloxetine, overlap both medications for one week, after that stop Prozac and take two of the Duloxetine pills.

## 2016-07-24 NOTE — Progress Notes (Signed)
Name: Jessica Burgess   MRN: [id number]    DOB: October 27, 1955   Date:07/24/2016       Progress Note  Subjective  Chief Complaint  Chief Complaint  Patient presents with  . Medication Refill    3 month F/U  . Migraine    Much better  . Anxiety    Really bad in April  . Hyperlipidemia  . Insomnia    Unchanged, goes to bed around 9:30 or 10 and will fall asleep around 11. Getting 8 to 9 hours nightly.  . Back Pain    Has had 3 bad episodes this week, would have to take medication and go straight to bed.    HPI  Dyslipidemia: she is taking fish oil and has been eating healthier and  Triglycerides has greatly improved, also HDL is higher.  Chronic back pain; previous history of back surgery, still has daily pain. Took Fentanyl patch years ago but caused worsening of migraines. She has been taking Percocet for many years. Explained risk of concomitant use of Ambien, BZD and Flexeril and she has been off BZD and Flexeril. We tried stopping Ambien and switched to Trazodone, but she was unable to sleep for days, she is willing to try Ambien CR. Discussed replacing Percocet with long acting narcotics but she is afraid of changing at this time. She sees Dr. Arnoldo Morale. She has been doing yard work and pain has been getting worse with the increase in activity level. Pain right now is 6/10, but can go up to 9/10 and has been waking her up at night.   Insomnia: takes Zolpidem, 10 mg daily, unable to tolerated Trazodone. We will try Ambien CR  Depression Major : she is getting worse, she states April was very hard on her.   Metabolic Syndrome/Prediabetes: hgbA1C is down, she is on Metformin and life style modification. She denies polyphagia, polyuria or polydipsia.   Cervical spine stenosis and radiculitis: s/p neck ( last surgery Dr. Arnoldo Morale March 2017 ) surgery was zero at times, but it is getting worse again, aching like pain. She is doing well now, no longer has tingling or numbness.  S  Migraine headache: she was doing well after neck surgery, she was worse in Feb, but is doing better, but still taking 9 pills per month, she never got Gabapentin filled at the pharmacy, we will give a print out to her today, it may improve back pain and also decrease migraine episodes.    Patient Active Problem List   Diagnosis Date Noted  . Chronic right-sided low back pain with right-sided sciatica 02/02/2016  . History of neck surgery 08/01/2015  . Hematoma 06/14/2015  . Perennial allergic rhinitis 05/05/2015  . Generalized anxiety disorder 02/03/2015  . Back pain, chronic 08/25/2014  . Insomnia, persistent 08/25/2014  . Chronic cervical pain 08/25/2014  . Major depression, chronic ([place]) 08/25/2014  . Dyslipidemia 08/25/2014  . Gastro-esophageal reflux disease without esophagitis 08/25/2014  . H/O high risk medication treatment 08/25/2014  . Blood glucose elevated 08/25/2014  . Migraine without aura and without status migrainosus, not intractable 08/25/2014  . Climacteric 08/25/2014  . Dysmetabolic syndrome 59/56/3875  . Fungal infection of toenail 08/25/2014  . Obesity (BMI 30.0-34.9) 08/25/2014  . Vitamin D deficiency 08/25/2014  . Engages in travel abroad 08/25/2014  . Cervical disc disorder with radiculopathy 04/28/2013    Past Surgical History:  Procedure Laterality Date  . ABDOMINAL HYSTERECTOMY    . ANTERIOR CERVICAL DECOMP/DISCECTOMY FUSION N/A 06/07/2015  Procedure: Cervical three - four and Cervical six- seven anterior cervical decompression with fusion interbody prosthesis plating and bonegraft;  Surgeon: Newman Pies, MD;  Location: [facility];  Service: Neurosurgery;  Laterality: N/A;  C34 and C67 anterior cervical decompression with fusion interbody prosthesis plating and bonegraft  . BACK SURGERY     x2 Lower   . EVACUATION OF CERVICAL HEMATOMA N/A 06/14/2015   Procedure: EVACUATION OF CERVICAL HEMATOMA;  Surgeon: Newman Pies, MD;  Location: [facility];  Service: Neurosurgery;  Laterality: N/A;  . NECK SURGERY     x3  . TONSILLECTOMY      Family History  Problem Relation Age of Onset  . Depression Mother   . Migraines Mother   . Dementia Father   . Hyperlipidemia Brother   . Breast cancer Paternal Aunt     17s    Social History   Social History  . Marital status: Single    Spouse name: N/A  . Number of children: 0  . Years of education: 12+   Occupational History  .  Disabled  . Disabled     Social History Main Topics  . Smoking status: Former Smoker    Packs/day: 1.50    Years: 20.00    Types: Cigarettes    Start date: 03/19/1979    Quit date: 08/25/1999  . Smokeless tobacco: Never Used  . Alcohol use 0.0 oz/week     Comment: wine occassionally  . Drug use: No  . Sexual activity: No   Other Topics Concern  . Not on file   Social History Narrative   Patient is single.    Patient lives with roommates.    Patient on disability    Patient has no children.    Patient has some college      Current Outpatient Prescriptions:  .  albuterol (PROVENTIL) (2.5 MG/3ML) 0.083% nebulizer solution, Take 3 mLs (2.5 mg total) by nebulization every 6 (six) hours as needed for wheezing or shortness of breath., Disp: 75 mL, Rfl: 2 .  Ascorbic Acid (VITAMIN C PO), Take by mouth., Disp: , Rfl:  .  atorvastatin (LIPITOR) 40 MG tablet, Take 1 tablet (40 mg total) by mouth daily., Disp: 90 tablet, Rfl: 4 .  Biotin 2500 MCG CAPS, Take 1 tablet by mouth daily. , Disp: , Rfl:  .  Cholecalciferol (VITAMIN D-1000 MAX ST) 1000 UNITS tablet, Take 1 tablet by mouth 2 (two) times daily., Disp: , Rfl:  .  Cyanocobalamin (B-12 PO), Take 1 tablet by mouth daily. , Disp: , Rfl:  .  docusate sodium (COLACE) 100 MG capsule, Take 1 capsule (100 mg total) by mouth 2 (two) times daily., Disp: 60 capsule, Rfl: 0 .  estradiol (ESTRACE) 0.5 MG tablet, take 1 tablet by mouth once daily, Disp: 90 tablet, Rfl: 4 .  ferrous sulfate (IRON  SUPPLEMENT) 325 (65 FE) MG tablet, Take 1 tablet by mouth daily., Disp: , Rfl:  .  fluticasone (FLONASE) 50 MCG/ACT nasal spray, Place 2 sprays into both nostrils as needed., Disp: 16 g, Rfl: 5 .  gabapentin (NEURONTIN) 100 MG capsule, Take 3 capsules (300 mg total) by mouth 3 (three) times daily. Titrate to up to 300 mg three times daily, Disp: 90 capsule, Rfl: 0 .  loratadine (CLARITIN) 10 MG tablet, Take 1 tablet (10 mg total) by mouth 2 (two) times daily., Disp: 180 tablet, Rfl: 1 .  Magnesium Oxide 500 MG CAPS, Take 500 mg by mouth 2 (two)  times daily. , Disp: , Rfl:  .  metFORMIN (GLUCOPHAGE) 500 MG tablet, Take 1 tablet (500 mg total) by mouth daily., Disp: 90 tablet, Rfl: 1 .  Multiple Vitamins-Minerals (MULTIVITAMIN PO), Take 1 tablet by mouth daily. , Disp: , Rfl:  .  Omega-3 Fatty Acids (FISH OIL PO), Take by mouth., Disp: , Rfl:  .  oxyCODONE-acetaminophen (PERCOCET) 10-325 MG tablet, Take 1 tablet by mouth every 8 (eight) hours as needed for pain., Disp: 84 tablet, Rfl: 0 .  oxyCODONE-acetaminophen (PERCOCET) 10-325 MG tablet, Take 1 tablet by mouth every 8 (eight) hours as needed for pain., Disp: 84 tablet, Rfl: 0 .  oxyCODONE-acetaminophen (PERCOCET) 10-325 MG tablet, Take 1 tablet by mouth every 4 (four) hours as needed for pain., Disp: 84 tablet, Rfl: 0 .  potassium gluconate 595 MG TABS tablet, Take 595 mg by mouth daily., Disp: , Rfl:  .  SUMAtriptan (IMITREX) 100 MG tablet, take 1 tablet by mouth if needed for migraines as directed, Disp: 27 tablet, Rfl: 1 .  zolpidem (AMBIEN CR) 6.25 MG CR tablet, Take 1 tablet (6.25 mg total) by mouth at bedtime., Disp: 90 tablet, Rfl: 0 .  DULoxetine (CYMBALTA) 30 MG capsule, Take 1-2 capsules (30-60 mg total) by mouth daily. 30 mg for  the first week after that 2 daily, Disp: 60 capsule, Rfl: 0  No Known Allergies   ROS  Constitutional: Negative for fever or significant weight change.  Respiratory: Negative for cough and shortness of  breath.   Cardiovascular: Negative for chest pain or palpitations.  Gastrointestinal: Negative for abdominal pain, no bowel changes.  Musculoskeletal: Negative for gait problem or joint swelling.  Skin: Negative for rash.  Neurological: Negative for dizziness , positive for intermittent headache.  No other specific complaints in a complete review of systems (except as listed in HPI above).  Objective  Vitals:   07/24/16 1047  BP: 130/76  Pulse: 82  Resp: 16  Temp: 98.6 F (37 C)  TempSrc: Oral  SpO2: 98%  Weight: 169 lb 9.6 oz (76.9 kg)  Height: 5\' 3"  (1.6 m)    Body mass index is 30.04 kg/m.  Physical Exam  Constitutional: Patient appears well-developed and well-nourished. Obese  No distress.  HEENT: head atraumatic, normocephalic, pupils equal and reactive to light,  neck supple, throat within normal limits Cardiovascular: Normal rate, regular rhythm and normal heart sounds.  No murmur heard. No BLE edema. Pulmonary/Chest: Effort normal and breath sounds normal. No respiratory distress. Abdominal: Soft.  There is no tenderness. Muscular Skeletal: pain during palpation of lumbar spine, negative straight leg raise Psychiatric: Patient has a normal mood and affect. behavior is normal. Judgment and thought content normal.   Recent Results (from the past 2160 hour(s))  Lipid panel     Status: Abnormal   Collection Time: 05/17/16 10:52 AM  Result Value Ref Range   Cholesterol 131 <200 mg/dL   Triglycerides 171 (H) <150 mg/dL   HDL 48 (L) >50 mg/dL   Total CHOL/HDL Ratio 2.7 <5.0 Ratio   VLDL 34 (H) <30 mg/dL   LDL Cholesterol 49 <100 mg/dL  Hemoglobin A1c     Status: None   Collection Time: 05/17/16 10:52 AM  Result Value Ref Range   Hgb A1c MFr Bld 5.4 <5.7 %    Comment:   For the purpose of screening for the presence of diabetes:   <5.7%       Consistent with the absence of diabetes 5.7-6.4 %   Consistent  with increased risk for diabetes (prediabetes) >=6.5 %      Consistent with diabetes   This assay result is consistent with a decreased risk of diabetes.   Currently, no consensus exists regarding use of hemoglobin A1c for diagnosis of diabetes in children.   According to American Diabetes Association (ADA) guidelines, hemoglobin A1c <7.0% represents optimal control in non-pregnant diabetic patients. Different metrics may apply to specific patient populations. Standards of Medical Care in Diabetes (ADA).      Mean Plasma Glucose 108 mg/dL  Insulin, fasting     Status: None   Collection Time: 05/17/16 10:52 AM  Result Value Ref Range   Insulin fasting, serum 7.6 2.0 - 19.6 uIU/mL    Comment:   This insulin assay shows strong cross-reactivity for some insulin analogs (lispro, aspart, and glargine) and much lower cross-reactivity with others (detemir, glulisine).   Stimulated Insulin reference intervals were established using the Siemens Immulite assay. These values are provided for general guidance only.   COMPLETE METABOLIC PANEL WITH GFR     Status: Abnormal   Collection Time: 05/17/16 10:52 AM  Result Value Ref Range   Sodium 140 135 - 146 mmol/L   Potassium 5.1 3.5 - 5.3 mmol/L   Chloride 103 98 - 110 mmol/L   CO2 30 20 - 31 mmol/L   Glucose, Bld 100 (H) 65 - 99 mg/dL   BUN 16 7 - 25 mg/dL   Creat 0.90 0.50 - 0.99 mg/dL    Comment:   For patients > or = 62 years of age: The upper reference limit for Creatinine is approximately 13% higher for people identified as African-American.      Total Bilirubin 0.5 0.2 - 1.2 mg/dL   Alkaline Phosphatase 70 33 - 130 U/L   AST 20 10 - 35 U/L   ALT 32 (H) 6 - 29 U/L   Total Protein 6.4 6.1 - 8.1 g/dL   Albumin 4.0 3.6 - 5.1 g/dL   Calcium 9.1 8.6 - 10.4 mg/dL   GFR, Est African American 80 >=60 mL/min   GFR, Est Non African American 69 >=60 mL/min      PHQ2/9: Depression screen [facility] 2/9 07/24/2016 05/06/2016 02/02/2016 11/01/2015 08/01/2015  Decreased Interest 0 0 1 0 0  Down,  Depressed, Hopeless 0 0 1 0 0  PHQ - 2 Score 0 0 2 0 0  Altered sleeping - - 0 - -  Tired, decreased energy - - 1 - -  Change in appetite - - 0 - -  Feeling bad or failure about yourself  - - 0 - -  Trouble concentrating - - 0 - -  Moving slowly or fidgety/restless - - 0 - -  Suicidal thoughts - - 0 - -  PHQ-9 Score - - 3 - -  Difficult doing work/chores - - Not difficult at all - -     Fall Risk: Fall Risk  07/24/2016 05/06/2016 02/02/2016 08/01/2015 05/05/2015  Falls in the past year? No No Yes No No  Number falls in past yr: - - 2 or more - -  Injury with Fall? - - No - -  Follow up - - Falls evaluation completed - -     Functional Status Survey: Is the patient deaf or have difficulty hearing?: No Does the patient have difficulty seeing, even when wearing glasses/contacts?: No Does the patient have difficulty concentrating, remembering, or making decisions?: No Does the patient have difficulty walking or climbing stairs?: No Does the  patient have difficulty dressing or bathing?: No Does the patient have difficulty doing errands alone such as visiting a doctor's office or shopping?: No   Assessment & Plan  1. Migraine without aura and without status migrainosus, not intractable  - gabapentin (NEURONTIN) 100 MG capsule; Take 3 capsules (300 mg total) by mouth 3 (three) times daily. Titrate to up to 300 mg three times daily  Dispense: 90 capsule; Refill: 0  2. Dyslipidemia  Greatly improved  3. Insomnia, persistent  She is interested in trying CR version of Ambien  - zolpidem (AMBIEN CR) 6.25 MG CR tablet; Take 1 tablet (6.25 mg total) by mouth at bedtime.  Dispense: 90 tablet; Refill: 0  4. Major depression, chronic  She states April she felt very depressed, taking Prozac and she has pain, so we will try Cymbalta - DULoxetine (CYMBALTA) 30 MG capsule; Take 1-2 capsules (30-60 mg total) by mouth daily. 30 mg for  the first week after that 2 daily  Dispense: 60 capsule;  Refill: 0  5. Chronic bilateral low back pain with right-sided sciatica  Pain is getting worse, but she has been able to take 84 pills per month, we will try adding Gabapentin, she states since [place] died she has been more active in her yard and feels like it is flaring her symptoms  6. Generalized anxiety disorder  - DULoxetine (CYMBALTA) 30 MG capsule; Take 1-2 capsules (30-60 mg total) by mouth daily. 30 mg for  the first week after that 2 daily  Dispense: 60 capsule; Refill: 0  7. Obesity (BMI 30.0-34.9)  She has lost weight since last visit, she states she has been eating healthier and exercising more  8. Cervical polyp  - Ambulatory referral to Gynecology  9. Chronic right-sided low back pain with right-sided sciatica  - gabapentin (NEURONTIN) 100 MG capsule; Take 3 capsules (300 mg total) by mouth 3 (three) times daily. Titrate to up to 300 mg three times daily  Dispense: 90 capsule; Refill: 0 - oxyCODONE-acetaminophen (PERCOCET) 10-325 MG tablet; Take 1 tablet by mouth every 8 (eight) hours as needed for pain.  Dispense: 84 tablet; Refill: 0 - oxyCODONE-acetaminophen (PERCOCET) 10-325 MG tablet; Take 1 tablet by mouth every 8 (eight) hours as needed for pain.  Dispense: 84 tablet; Refill: 0 - oxyCODONE-acetaminophen (PERCOCET) 10-325 MG tablet; Take 1 tablet by mouth every 4 (four) hours as needed for pain.  Dispense: 84 tablet; Refill: 0 - DULoxetine (CYMBALTA) 30 MG capsule; Take 1-2 capsules (30-60 mg total) by mouth daily. 30 mg for  the first week after that 2 daily  Dispense: 60 capsule; Refill: 0  10. Chronic cervical pain  - gabapentin (NEURONTIN) 100 MG capsule; Take 3 capsules (300 mg total) by mouth 3 (three) times daily. Titrate to up to 300 mg three times daily  Dispense: 90 capsule; Refill: 0 - oxyCODONE-acetaminophen (PERCOCET) 10-325 MG tablet; Take 1 tablet by mouth every 8 (eight) hours as needed for pain.  Dispense: 84 tablet; Refill: 0 -  oxyCODONE-acetaminophen (PERCOCET) 10-325 MG tablet; Take 1 tablet by mouth every 8 (eight) hours as needed for pain.  Dispense: 84 tablet; Refill: 0 - oxyCODONE-acetaminophen (PERCOCET) 10-325 MG tablet; Take 1 tablet by mouth every 4 (four) hours as needed for pain.  Dispense: 84 tablet; Refill: 0 - DULoxetine (CYMBALTA) 30 MG capsule; Take 1-2 capsules (30-60 mg total) by mouth daily. 30 mg for  the first week after that 2 daily  Dispense: 60 capsule; Refill: 0  11. Dysmetabolic  syndrome  - metFORMIN (GLUCOPHAGE) 500 MG tablet; Take 1 tablet (500 mg total) by mouth daily.  Dispense: 90 tablet; Refill: 1

## 2016-08-05 ENCOUNTER — Ambulatory Visit: Payer: PPO | Admitting: Family Medicine

## 2016-08-20 DIAGNOSIS — D225 Melanocytic nevi of trunk: Secondary | ICD-10-CM | POA: Diagnosis not present

## 2016-08-20 DIAGNOSIS — Z85828 Personal history of other malignant neoplasm of skin: Secondary | ICD-10-CM | POA: Diagnosis not present

## 2016-08-20 DIAGNOSIS — L570 Actinic keratosis: Secondary | ICD-10-CM | POA: Diagnosis not present

## 2016-08-20 DIAGNOSIS — D2261 Melanocytic nevi of right upper limb, including shoulder: Secondary | ICD-10-CM | POA: Diagnosis not present

## 2016-08-22 ENCOUNTER — Other Ambulatory Visit: Payer: Self-pay | Admitting: Family Medicine

## 2016-08-22 DIAGNOSIS — G43009 Migraine without aura, not intractable, without status migrainosus: Secondary | ICD-10-CM

## 2016-08-28 ENCOUNTER — Other Ambulatory Visit: Payer: Self-pay | Admitting: Family Medicine

## 2016-08-28 DIAGNOSIS — G8929 Other chronic pain: Secondary | ICD-10-CM

## 2016-08-28 DIAGNOSIS — M542 Cervicalgia: Secondary | ICD-10-CM

## 2016-08-28 DIAGNOSIS — F411 Generalized anxiety disorder: Secondary | ICD-10-CM

## 2016-08-28 DIAGNOSIS — M5441 Lumbago with sciatica, right side: Secondary | ICD-10-CM

## 2016-08-28 DIAGNOSIS — F329 Major depressive disorder, single episode, unspecified: Secondary | ICD-10-CM

## 2016-08-29 NOTE — Telephone Encounter (Signed)
Patient requesting refill of Duloxetine to [facility].

## 2016-09-06 ENCOUNTER — Encounter: Payer: Self-pay | Admitting: Family Medicine

## 2016-09-06 ENCOUNTER — Ambulatory Visit (INDEPENDENT_AMBULATORY_CARE_PROVIDER_SITE_OTHER): Payer: PPO | Admitting: Family Medicine

## 2016-09-06 VITALS — BP 132/84 | HR 99 | Temp 97.9°F | Resp 18 | Ht 63.0 in | Wt 173.3 lb

## 2016-09-06 DIAGNOSIS — M5441 Lumbago with sciatica, right side: Secondary | ICD-10-CM | POA: Diagnosis not present

## 2016-09-06 DIAGNOSIS — G8929 Other chronic pain: Secondary | ICD-10-CM | POA: Diagnosis not present

## 2016-09-06 DIAGNOSIS — G43009 Migraine without aura, not intractable, without status migrainosus: Secondary | ICD-10-CM | POA: Diagnosis not present

## 2016-09-06 DIAGNOSIS — M542 Cervicalgia: Secondary | ICD-10-CM

## 2016-09-06 MED ORDER — GABAPENTIN 300 MG PO CAPS: 300.0000 mg | ORAL_CAPSULE | Freq: Three times a day (TID) | ORAL | 0 refills | Status: DC

## 2016-09-06 NOTE — Progress Notes (Signed)
Name: Jessica Burgess   MRN: [id number]    DOB: Sep 22, 1955   Date:09/06/2016       Progress Note  Subjective  Chief Complaint  Chief Complaint  Patient presents with  . Follow-up    1 month F/U  . Depression    States she is unable to tell a difference taking the Cymbalta  . Back Pain    Had a question about her pain medication, instructions are take every 8 hours. But if patient takes it this way it will only last her 28 days instead of 30-31 days. Patient states taking 2 a day does not control symptoms    HPI  Chronic back pain; previous history of back surgery, still has daily pain. Took Fentanyl patch years ago but caused worsening of migraines. She has been taking Percocet for many years. Explained risk of concomitant use of Ambien, BZD and Flexeril and she has been off BZD and Flexeril. We tried stopping Ambien and switched to Trazodone, but she was unable to sleep for days, she is willing to try Ambien CR. Discussed replacing Percocet with long acting narcotics but she is afraid of changing at this time. She sees Dr. Arnoldo Morale. She has been doing yard work and pain has been getting worse with the increase in activity level. Pain right now is 6/10, average pain is 6-8/10 without medication, down to 1 with pain medication, but can go up to 9/10 and has been waking her up at night. We started her on Cymbalta and Gabapentin 07/2016 but states she has not noticed a change in pain level. Advised to go up on the dose of Gabapentin to 300 mg at night, and continue 100 mg in am's for now. We will see if Dr. Arnoldo Morale can start treating her for pain since I don't feel comfortable with higher dose of narcotics.    Patient Active Problem List   Diagnosis Date Noted  . Chronic right-sided low back pain with right-sided sciatica 02/02/2016  . History of neck surgery 08/01/2015  . Hematoma 06/14/2015  . Perennial allergic rhinitis 05/05/2015  . Generalized anxiety disorder 02/03/2015  . Back pain,  chronic 08/25/2014  . Insomnia, persistent 08/25/2014  . Chronic cervical pain 08/25/2014  . Major depression, chronic ([place]) 08/25/2014  . Dyslipidemia 08/25/2014  . Gastro-esophageal reflux disease without esophagitis 08/25/2014  . H/O high risk medication treatment 08/25/2014  . Blood glucose elevated 08/25/2014  . Migraine without aura and without status migrainosus, not intractable 08/25/2014  . Climacteric 08/25/2014  . Dysmetabolic syndrome 94/85/4627  . Fungal infection of toenail 08/25/2014  . Obesity (BMI 30.0-34.9) 08/25/2014  . Vitamin D deficiency 08/25/2014  . Engages in travel abroad 08/25/2014  . Cervical disc disorder with radiculopathy 04/28/2013    Past Surgical History:  Procedure Laterality Date  . ABDOMINAL HYSTERECTOMY    . ANTERIOR CERVICAL DECOMP/DISCECTOMY FUSION N/A 06/07/2015   Procedure: Cervical three - four and Cervical six- seven anterior cervical decompression with fusion interbody prosthesis plating and bonegraft;  Surgeon: Newman Pies, MD;  Location: [facility];  Service: Neurosurgery;  Laterality: N/A;  C34 and C67 anterior cervical decompression with fusion interbody prosthesis plating and bonegraft  . BACK SURGERY     x2 Lower   . EVACUATION OF CERVICAL HEMATOMA N/A 06/14/2015   Procedure: EVACUATION OF CERVICAL HEMATOMA;  Surgeon: Newman Pies, MD;  Location: [facility];  Service: Neurosurgery;  Laterality: N/A;  . NECK SURGERY     x3  . TONSILLECTOMY  Family History  Problem Relation Age of Onset  . Depression Mother   . Migraines Mother   . Dementia Father   . Hyperlipidemia Brother   . Breast cancer Paternal Aunt        51s    Social History   Social History  . Marital status: Single    Spouse name: N/A  . Number of children: 0  . Years of education: 12+   Occupational History  .  Disabled  . Disabled     Social History Main Topics  . Smoking status: Former Smoker    Packs/day: 1.50    Years: 20.00     Types: Cigarettes    Start date: 03/19/1979    Quit date: 08/25/1999  . Smokeless tobacco: Never Used  . Alcohol use 0.0 oz/week     Comment: wine occassionally  . Drug use: No  . Sexual activity: No   Other Topics Concern  . Not on file   Social History Narrative   Patient is single.    Patient lives with roommates.    Patient on disability    Patient has no children.    Patient has some college      Current Outpatient Prescriptions:  .  albuterol (PROVENTIL) (2.5 MG/3ML) 0.083% nebulizer solution, Take 3 mLs (2.5 mg total) by nebulization every 6 (six) hours as needed for wheezing or shortness of breath., Disp: 75 mL, Rfl: 2 .  Ascorbic Acid (VITAMIN C PO), Take by mouth., Disp: , Rfl:  .  atorvastatin (LIPITOR) 40 MG tablet, Take 1 tablet (40 mg total) by mouth daily., Disp: 90 tablet, Rfl: 4 .  Cholecalciferol (VITAMIN D-1000 MAX ST) 1000 UNITS tablet, Take 1 tablet by mouth 2 (two) times daily., Disp: , Rfl:  .  Cyanocobalamin (B-12 PO), Take 1 tablet by mouth daily. , Disp: , Rfl:  .  docusate sodium (COLACE) 100 MG capsule, Take 1 capsule (100 mg total) by mouth 2 (two) times daily., Disp: 60 capsule, Rfl: 0 .  DULoxetine (CYMBALTA) 60 MG capsule, Take 1 capsule (60 mg total) by mouth daily., Disp: 30 capsule, Rfl: 0 .  estradiol (ESTRACE) 0.5 MG tablet, take 1 tablet by mouth once daily, Disp: 90 tablet, Rfl: 4 .  ferrous sulfate (IRON SUPPLEMENT) 325 (65 FE) MG tablet, Take 1 tablet by mouth daily., Disp: , Rfl:  .  fluticasone (FLONASE) 50 MCG/ACT nasal spray, Place 2 sprays into both nostrils as needed., Disp: 16 g, Rfl: 5 .  gabapentin (NEURONTIN) 100 MG capsule, Take 3 capsules (300 mg total) by mouth 3 (three) times daily. Titrate to up to 300 mg three times daily, Disp: 90 capsule, Rfl: 0 .  Magnesium Oxide 500 MG CAPS, Take 500 mg by mouth 2 (two) times daily. , Disp: , Rfl:  .  metFORMIN (GLUCOPHAGE) 500 MG tablet, Take 1 tablet (500 mg total) by mouth daily., Disp: 90  tablet, Rfl: 1 .  Multiple Vitamins-Minerals (MULTIVITAMIN PO), Take 1 tablet by mouth daily. , Disp: , Rfl:  .  Omega-3 Fatty Acids (FISH OIL PO), Take by mouth., Disp: , Rfl:  .  oxyCODONE-acetaminophen (PERCOCET) 10-325 MG tablet, Take 1 tablet by mouth every 8 (eight) hours as needed for pain., Disp: 84 tablet, Rfl: 0 .  oxyCODONE-acetaminophen (PERCOCET) 10-325 MG tablet, Take 1 tablet by mouth every 8 (eight) hours as needed for pain., Disp: 84 tablet, Rfl: 0 .  oxyCODONE-acetaminophen (PERCOCET) 10-325 MG tablet, Take 1 tablet by mouth every 4 (four)  hours as needed for pain., Disp: 84 tablet, Rfl: 0 .  potassium gluconate 595 MG TABS tablet, Take 595 mg by mouth daily., Disp: , Rfl:  .  SUMAtriptan (IMITREX) 100 MG tablet, take 1 tablet by mouth if needed for migraines as directed, Disp: 27 tablet, Rfl: 1 .  zolpidem (AMBIEN CR) 6.25 MG CR tablet, Take 1 tablet (6.25 mg total) by mouth at bedtime., Disp: 90 tablet, Rfl: 0  No Known Allergies   ROS  Ten systems reviewed and is negative except as mentioned in HPI   Objective  Vitals:   09/06/16 1551  BP: 132/84  Pulse: 99  Resp: 18  Temp: 97.9 F (36.6 C)  TempSrc: Oral  SpO2: 95%  Weight: 173 lb 4.8 oz (78.6 kg)  Height: 5\' 3"  (1.6 m)    Body mass index is 30.7 kg/m.  Physical Exam  Constitutional: Patient appears well-developed and well-nourished. Obese  No distress.  HEENT: head atraumatic, normocephalic, pupils equal and reactive to light, neck supple, throat within normal limits Skin: sunburn Cardiovascular: Normal rate, regular rhythm and normal heart sounds.  No murmur heard. No BLE edema. Pulmonary/Chest: Effort normal and breath sounds normal. No respiratory distress. Abdominal: Soft.  There is no tenderness. Psychiatric: Patient has a normal mood and affect. behavior is normal. Judgment and thought content normal. Muscular Skeletal: pain during palpation of lumbar spine , negative straight leg  raise  PHQ2/9: Depression screen [facility] 2/9 07/24/2016 05/06/2016 02/02/2016 11/01/2015 08/01/2015  Decreased Interest 0 0 1 0 0  Down, Depressed, Hopeless 0 0 1 0 0  PHQ - 2 Score 0 0 2 0 0  Altered sleeping - - 0 - -  Tired, decreased energy - - 1 - -  Change in appetite - - 0 - -  Feeling bad or failure about yourself  - - 0 - -  Trouble concentrating - - 0 - -  Moving slowly or fidgety/restless - - 0 - -  Suicidal thoughts - - 0 - -  PHQ-9 Score - - 3 - -  Difficult doing work/chores - - Not difficult at all - -    Fall Risk: Fall Risk  07/24/2016 05/06/2016 02/02/2016 08/01/2015 05/05/2015  Falls in the past year? No No Yes No No  Number falls in past yr: - - 2 or more - -  Injury with Fall? - - No - -  Follow up - - Falls evaluation completed - -     Assessment & Plan  1. Chronic right-sided low back pain with right-sided sciatica  - gabapentin (NEURONTIN) 300 MG capsule; Take 1 capsule (300 mg total) by mouth 3 (three) times daily. Titrate to up to 300 mg three times daily  Dispense: 270 capsule; Refill: 0 - Ambulatory referral to Pain Clinic  2. Chronic cervical pain  - gabapentin (NEURONTIN) 300 MG capsule; Take 1 capsule (300 mg total) by mouth 3 (three) times daily. Titrate to up to 300 mg three times daily  Dispense: 270 capsule; Refill: 0 - Ambulatory referral to Pain Clinic  3. Migraine without aura and without status migrainosus, not intractable  - gabapentin (NEURONTIN) 300 MG capsule; Take 1 capsule (300 mg total) by mouth 3 (three) times daily. Titrate to up to 300 mg three times daily  Dispense: 270 capsule; Refill: 0

## 2016-09-12 DIAGNOSIS — M542 Cervicalgia: Secondary | ICD-10-CM | POA: Diagnosis not present

## 2016-09-12 DIAGNOSIS — G44219 Episodic tension-type headache, not intractable: Secondary | ICD-10-CM | POA: Diagnosis not present

## 2016-09-12 DIAGNOSIS — M9901 Segmental and somatic dysfunction of cervical region: Secondary | ICD-10-CM | POA: Diagnosis not present

## 2016-09-26 ENCOUNTER — Other Ambulatory Visit: Payer: Self-pay | Admitting: Family Medicine

## 2016-09-26 NOTE — Telephone Encounter (Signed)
Patient requesting refill of estrace to [facility].

## 2016-09-27 ENCOUNTER — Telehealth: Payer: Self-pay | Admitting: Family Medicine

## 2016-10-01 ENCOUNTER — Other Ambulatory Visit: Payer: Self-pay | Admitting: Family Medicine

## 2016-10-01 DIAGNOSIS — M5441 Lumbago with sciatica, right side: Secondary | ICD-10-CM

## 2016-10-01 DIAGNOSIS — F411 Generalized anxiety disorder: Secondary | ICD-10-CM

## 2016-10-01 DIAGNOSIS — M545 Low back pain: Secondary | ICD-10-CM | POA: Diagnosis not present

## 2016-10-01 DIAGNOSIS — R030 Elevated blood-pressure reading, without diagnosis of hypertension: Secondary | ICD-10-CM | POA: Diagnosis not present

## 2016-10-01 DIAGNOSIS — M542 Cervicalgia: Secondary | ICD-10-CM | POA: Diagnosis not present

## 2016-10-01 DIAGNOSIS — G8929 Other chronic pain: Secondary | ICD-10-CM

## 2016-10-01 DIAGNOSIS — F329 Major depressive disorder, single episode, unspecified: Secondary | ICD-10-CM

## 2016-10-01 DIAGNOSIS — Z6831 Body mass index (BMI) 31.0-31.9, adult: Secondary | ICD-10-CM | POA: Diagnosis not present

## 2016-10-04 ENCOUNTER — Ambulatory Visit (INDEPENDENT_AMBULATORY_CARE_PROVIDER_SITE_OTHER): Payer: PPO | Admitting: Family Medicine

## 2016-10-04 ENCOUNTER — Telehealth: Payer: Self-pay | Admitting: Family Medicine

## 2016-10-04 ENCOUNTER — Ambulatory Visit
Admission: RE | Admit: 2016-10-04 | Discharge: 2016-10-04 | Disposition: A | Discharge status: Home / Self Care | Payer: PPO | Source: Ambulatory Visit | Attending: Family Medicine | Admitting: Family Medicine

## 2016-10-04 ENCOUNTER — Encounter: Payer: Self-pay | Admitting: Family Medicine

## 2016-10-04 VITALS — BP 120/80 | HR 89 | Temp 97.9°F | Resp 16 | Ht 63.0 in | Wt 180.2 lb

## 2016-10-04 DIAGNOSIS — R0989 Other specified symptoms and signs involving the circulatory and respiratory systems: Secondary | ICD-10-CM

## 2016-10-04 DIAGNOSIS — R0602 Shortness of breath: Secondary | ICD-10-CM

## 2016-10-04 DIAGNOSIS — M47814 Spondylosis without myelopathy or radiculopathy, thoracic region: Secondary | ICD-10-CM | POA: Diagnosis not present

## 2016-10-04 DIAGNOSIS — Z9889 Other specified postprocedural states: Secondary | ICD-10-CM | POA: Diagnosis not present

## 2016-10-04 DIAGNOSIS — H109 Unspecified conjunctivitis: Secondary | ICD-10-CM

## 2016-10-04 DIAGNOSIS — R0981 Nasal congestion: Secondary | ICD-10-CM

## 2016-10-04 MED ORDER — ALBUTEROL SULFATE HFA 108 (90 BASE) MCG/ACT IN AERS: 2.0000 | INHALATION_SPRAY | Freq: Four times a day (QID) | RESPIRATORY_TRACT | 2 refills | Status: AC | PRN

## 2016-10-04 MED ORDER — PREDNISONE 10 MG PO TABS: 10.0000 mg | ORAL_TABLET | Freq: Every day | ORAL | 0 refills | Status: DC

## 2016-10-04 MED ORDER — LEVOCETIRIZINE DIHYDROCHLORIDE 5 MG PO TABS: 5.0000 mg | ORAL_TABLET | Freq: Every evening | ORAL | 0 refills | Status: DC

## 2016-10-04 MED ORDER — ERYTHROMYCIN 5 MG/GM OP OINT: 1.0000 "application " | TOPICAL_OINTMENT | Freq: Three times a day (TID) | OPHTHALMIC | 0 refills | Status: DC

## 2016-10-04 NOTE — Patient Instructions (Addendum)
Bacterial Conjunctivitis Bacterial conjunctivitis is an infection of your conjunctiva. This is the clear membrane that covers the white part of your eye and the inner surface of your eyelid. This condition can make your eye:  Red or pink.  Itchy.  This condition is caused by bacteria. This condition spreads very easily from person to person (is contagious) and from one eye to the other eye. Follow these instructions at home: Medicines  Take or apply your antibiotic medicine as told by your doctor. Do not stop taking or applying the antibiotic even if you start to feel better.  Take or apply over-the-counter and prescription medicines only as told by your doctor.  Do not touch your eyelid with the eye drop bottle or the ointment tube. Managing discomfort  Wipe any fluid from your eye with a warm, wet washcloth or a cotton ball.  Place a cool, clean washcloth on your eye. Do this for 10-20 minutes, 3-4 times per day. General instructions  Do not wear contact lenses until the irritation is gone. Wear glasses until your doctor says it is okay to wear contacts.  Do not wear eye makeup until your symptoms are gone. Throw away any old makeup.  Change or wash your pillowcase every day.  Do not share towels or washcloths with anyone.  Wash your hands often with soap and water. Use paper towels to dry your hands.  Do not touch or rub your eyes.  Do not drive or use heavy machinery if your vision is blurry. Contact a doctor if:  You have a fever.  Your symptoms do not get better after 10 days. Get help right away if:  You have a fever and your symptoms suddenly get worse.  You have very bad pain when you move your eye.  Your face: ? Hurts. ? Is red. ? Is swollen.  You have sudden loss of vision. This information is not intended to replace advice given to you by your health care provider. Make sure you discuss any questions you have with your health care provider. Document  Released: 12/12/2007 Document Revised: 08/10/2015 Document Reviewed: 12/15/2014 Elsevier Interactive Patient Education  2018 [employer].  Viral Respiratory Infection A viral respiratory infection is an illness that affects parts of the body used for breathing, like the lungs, nose, and throat. It is caused by a germ called a virus. Some examples of this kind of infection are:  A cold.  The flu (influenza).  A respiratory syncytial virus (RSV) infection.  How do I know if I have this infection? Most of the time this infection causes:  A stuffy or runny nose.  Yellow or green fluid in the nose.  A cough.  Sneezing.  Tiredness (fatigue).  Achy muscles.  A sore throat.  Sweating or chills.  A fever.  A headache.  How is this infection treated? If the flu is diagnosed early, it may be treated with an antiviral medicine. This medicine shortens the length of time a person has symptoms. Symptoms may be treated with over-the-counter and prescription medicines, such as:  Expectorants. These make it easier to cough up mucus.  Decongestant nasal sprays.  Doctors do not prescribe antibiotic medicines for viral infections. They do not work with this kind of infection. How do I know if I should stay home? To keep others from getting sick, stay home if you have:  A fever.  A lasting cough.  A sore throat.  A runny nose.  Sneezing.  Muscles aches.  Headaches.  Tiredness.  Weakness.  Chills.  Sweating.  An upset stomach (nausea).  Follow these instructions at home:  Rest as much as possible.  Take over-the-counter and prescription medicines only as told by your doctor.  Drink enough fluid to keep your pee (urine) clear or pale yellow.  Gargle with salt water. Do this 3-4 times per day or as needed. To make a salt-water mixture, dissolve -1 tsp of salt in 1 cup of warm water. Make sure the salt dissolves all the way.  Use nose drops made from salt  water. This helps with stuffiness (congestion). It also helps soften the skin around your nose.  Do not drink alcohol.  Do not use tobacco products, including cigarettes, chewing tobacco, and e-cigarettes. If you need help quitting, ask your doctor. Get help if:  Your symptoms last for 10 days or longer.  Your symptoms get worse over time.  You have a fever.  You have very bad pain in your face or forehead.  Parts of your jaw or neck become very swollen. Get help right away if:  You feel pain or pressure in your chest.  You have shortness of breath.  You faint or feel like you will faint.  You keep throwing up (vomiting).  You feel confused. This information is not intended to replace advice given to you by your health care provider. Make sure you discuss any questions you have with your health care provider. Document Released: 02/15/2008 Document Revised: 08/10/2015 Document Reviewed: 08/10/2014 Elsevier Interactive Patient Education  2018 [employer].

## 2016-10-04 NOTE — Telephone Encounter (Signed)
Referral sent 

## 2016-10-04 NOTE — Telephone Encounter (Signed)
Pt came to the office stating she needs a referral to Pain Management. She request [facility] to see Dr Clydell Hakim. Pts best contact number is [phone].

## 2016-10-04 NOTE — Progress Notes (Addendum)
Name: Jessica Burgess   MRN: [id number]    DOB: 1955/05/10   Date:10/04/2016       Progress Note  Subjective  Chief Complaint  Chief Complaint  Patient presents with  . URI    cough, congested, green nasal drainage  . Eye Problem    red, matted together    HPI URI: PT presents with 7 day history of chest congestion and tightness, sinus pressure, shortness of breath, mild nausea, fatigue, body aches, mild fevers and chills. She was traveling 8 days ago via airplane from [facility] for a wedding and became ill the next day - suspects contact with illness during this flight. She denies any leg swelling/erythema. She denies a history of asthma or COPD, but is prescribed albuterol nebulizer for occasional wheezing - says this typically occurs when sick or anxious. No Chest pain, vomiting/diarrhea, abdominal pain, lack of appetite; drinking plenty of fluids. Used albuterol Neb treatments x2 with minimal relief.  Not using flonase or decongestant.  Conjunctivitis: Also notes that her left eye was red and watery yesterday, is a little better today, but now her right eye is red and watery today. Reports waking with dried yellow/brown exudate bilaterally, endorses mild itching.  No vision changes.  Patient Active Problem List   Diagnosis Date Noted  . Chronic right-sided low back pain with right-sided sciatica 02/02/2016  . History of neck surgery 08/01/2015  . Hematoma 06/14/2015  . Perennial allergic rhinitis 05/05/2015  . Generalized anxiety disorder 02/03/2015  . Back pain, chronic 08/25/2014  . Insomnia, persistent 08/25/2014  . Chronic cervical pain 08/25/2014  . Major depression, chronic ([place]) 08/25/2014  . Dyslipidemia 08/25/2014  . Gastro-esophageal reflux disease without esophagitis 08/25/2014  . H/O high risk medication treatment 08/25/2014  . Blood glucose elevated 08/25/2014  . Migraine without aura and without status migrainosus, not intractable 08/25/2014  . Climacteric  08/25/2014  . Dysmetabolic syndrome 51/04/5850  . Fungal infection of toenail 08/25/2014  . Obesity (BMI 30.0-34.9) 08/25/2014  . Vitamin D deficiency 08/25/2014  . Engages in travel abroad 08/25/2014  . Cervical disc disorder with radiculopathy 04/28/2013    Social History  Substance Use Topics  . Smoking status: Former Smoker    Packs/day: 1.50    Years: 20.00    Types: Cigarettes    Start date: 03/19/1979    Quit date: 08/25/1999  . Smokeless tobacco: Never Used  . Alcohol use 0.0 oz/week     Comment: wine occassionally     Current Outpatient Prescriptions:  .  albuterol (PROVENTIL) (2.5 MG/3ML) 0.083% nebulizer solution, Take 3 mLs (2.5 mg total) by nebulization every 6 (six) hours as needed for wheezing or shortness of breath., Disp: 75 mL, Rfl: 2 .  Ascorbic Acid (VITAMIN C PO), Take by mouth., Disp: , Rfl:  .  atorvastatin (LIPITOR) 40 MG tablet, Take 1 tablet (40 mg total) by mouth daily., Disp: 90 tablet, Rfl: 4 .  Cholecalciferol (VITAMIN D-1000 MAX ST) 1000 UNITS tablet, Take 1 tablet by mouth 2 (two) times daily., Disp: , Rfl:  .  Cyanocobalamin (B-12 PO), Take 1 tablet by mouth daily. , Disp: , Rfl:  .  docusate sodium (COLACE) 100 MG capsule, Take 1 capsule (100 mg total) by mouth 2 (two) times daily., Disp: 60 capsule, Rfl: 0 .  DULoxetine (CYMBALTA) 60 MG capsule, take 1 capsule by mouth once daily, Disp: 90 capsule, Rfl: 1 .  estradiol (ESTRACE) 0.5 MG tablet, take 1 tablet by mouth once daily, Disp:  90 tablet, Rfl: 4 .  ferrous sulfate (IRON SUPPLEMENT) 325 (65 FE) MG tablet, Take 1 tablet by mouth daily., Disp: , Rfl:  .  fluticasone (FLONASE) 50 MCG/ACT nasal spray, Place 2 sprays into both nostrils as needed., Disp: 16 g, Rfl: 5 .  gabapentin (NEURONTIN) 300 MG capsule, Take 1 capsule (300 mg total) by mouth 3 (three) times daily. Titrate to up to 300 mg three times daily, Disp: 270 capsule, Rfl: 0 .  Magnesium Oxide 500 MG CAPS, Take 500 mg by mouth 2 (two) times  daily. , Disp: , Rfl:  .  metFORMIN (GLUCOPHAGE) 500 MG tablet, Take 1 tablet (500 mg total) by mouth daily., Disp: 90 tablet, Rfl: 1 .  Multiple Vitamins-Minerals (MULTIVITAMIN PO), Take 1 tablet by mouth daily. , Disp: , Rfl:  .  Omega-3 Fatty Acids (FISH OIL PO), Take by mouth., Disp: , Rfl:  .  oxyCODONE-acetaminophen (PERCOCET) 10-325 MG tablet, Take 1 tablet by mouth every 8 (eight) hours as needed for pain., Disp: 84 tablet, Rfl: 0 .  oxyCODONE-acetaminophen (PERCOCET) 10-325 MG tablet, Take 1 tablet by mouth every 8 (eight) hours as needed for pain., Disp: 84 tablet, Rfl: 0 .  oxyCODONE-acetaminophen (PERCOCET) 10-325 MG tablet, Take 1 tablet by mouth every 4 (four) hours as needed for pain., Disp: 84 tablet, Rfl: 0 .  potassium gluconate 595 MG TABS tablet, Take 595 mg by mouth daily., Disp: , Rfl:  .  SUMAtriptan (IMITREX) 100 MG tablet, take 1 tablet by mouth if needed for migraines as directed, Disp: 27 tablet, Rfl: 1 .  zolpidem (AMBIEN CR) 6.25 MG CR tablet, Take 1 tablet (6.25 mg total) by mouth at bedtime., Disp: 90 tablet, Rfl: 0 .  albuterol (PROVENTIL HFA;VENTOLIN HFA) 108 (90 Base) MCG/ACT inhaler, Inhale 2 puffs into the lungs every 6 (six) hours as needed for wheezing or shortness of breath., Disp: 1 Inhaler, Rfl: 2 .  erythromycin ophthalmic ointment, Place 1 application into both eyes 3 (three) times daily., Disp: 3.5 g, Rfl: 0 .  levocetirizine (XYZAL) 5 MG tablet, Take 1 tablet (5 mg total) by mouth every evening., Disp: 15 tablet, Rfl: 0 .  predniSONE (DELTASONE) 10 MG tablet, Take 1 tablet (10 mg total) by mouth daily with breakfast. Day1:6tabs, Day2:5tabs, Day3:4tabs, Day 4:3tabs, Day5:2tabs, Day6:1tab, Disp: 21 tablet, Rfl: 0  No Known Allergies  ROS  Ten systems reviewed and is negative except as mentioned in HPI  Objective  Vitals:   10/04/16 1018  BP: 120/80  Pulse: 89  Resp: 16  Temp: 97.9 F (36.6 C)  TempSrc: Oral  SpO2: 96%  Weight: 180 lb 3.2 oz  (81.7 kg)  Height: 5\' 3"  (1.6 m)   Body mass index is 31.92 kg/m.  Nursing Note and Vital Signs reviewed.  Physical Exam  Constitutional: Patient appears well-developed and well-nourished. Obese No distress.  HEENT: head atraumatic, normocephalic, pupils equal and reactive to light, EOM's intact, TM's without erythema or bulging, no maxillary or frontal sinus pain on palpation, neck supple without lymphadenopathy, oropharynx mildly erythematous and moist without exudate Cardiovascular: Normal rate, regular rhythm, S1/S2 present.  No murmur or rub heard. No BLE edema. Pulmonary/Chest: Effort normal and breath sounds clear. No respiratory distress or retractions. Psychiatric: Patient has a normal mood and affect. behavior is normal. Judgment and thought content normal.  No results found for this or any previous visit (from the past 2160 hour(s)).   Assessment & Plan  1. Bacterial conjunctivitis of both eyes - erythromycin  ophthalmic ointment; Place 1 application into both eyes 3 (three) times daily.  Dispense: 3.5 g; Refill: 0 - Warm or Cool compresses PRN.  2. Chest congestion - albuterol (PROVENTIL HFA;VENTOLIN HFA) 108 (90 Base) MCG/ACT inhaler; Inhale 2 puffs into the lungs every 6 (six) hours as needed for wheezing or shortness of breath.  Dispense: 1 Inhaler; Refill: 2 - Inhaler provided for patient to have albuterol available while outside of the home if she becomes short of breath. Discussed its use in conjunction with nebulizer treatment and pt verbalizes understanding. - DG Chest 2 View; Future - Patient has strong preference for CXR to r/o PNA, I advised that lungs are clear, but that due to her shortness of breath, chest congestion and tightness it is reasonable to perform. Result is Negative for PNA or bronchitic changes. - predniSONE (DELTASONE) 10 MG tablet; Take 1 tablet (10 mg total) by mouth daily with breakfast. Day1:6tabs, Day2:5tabs, Day3:4tabs, Day 4:3tabs,  Day5:2tabs, Day6:1tab  Dispense: 21 tablet; Refill: 0  3. Shortness of breath - albuterol (PROVENTIL HFA;VENTOLIN HFA) 108 (90 Base) MCG/ACT inhaler; Inhale 2 puffs into the lungs every 6 (six) hours as needed for wheezing or shortness of breath.  Dispense: 1 Inhaler; Refill: 2 - DG Chest 2 View; Future - predniSONE (DELTASONE) 10 MG tablet; Take 1 tablet (10 mg total) by mouth daily with breakfast. Day1:6tabs, Day2:5tabs, Day3:4tabs, Day 4:3tabs, Day5:2tabs, Day6:1tab  Dispense: 21 tablet; Refill: 0  4. Nasal congestion - levocetirizine (XYZAL) 5 MG tablet; Take 1 tablet (5 mg total) by mouth every evening.  Dispense: 15 tablet; Refill: 0 - predniSONE (DELTASONE) 10 MG tablet; Take 1 tablet (10 mg total) by mouth daily with breakfast. Day1:6tabs, Day2:5tabs, Day3:4tabs, Day 4:3tabs, Day5:2tabs, Day6:1tab  Dispense: 21 tablet; Refill: 0 - Will use flonase during illness  -Red flags and when to present for emergency care or RTC including fever >101.80F, chest pain, shortness of breath unresolved with breathing treatment, new/worsening/un-resolving symptoms, reviewed with patient at time of visit. Follow up and care instructions discussed and provided in AVS.  I have reviewed this encounter including the documentation in this note and/or discussed this patient with the Johney Maine, FNP, NP-C. I am certifying that I agree with the content of this note as supervising physician.  Steele Sizer, MD [facility] 10/04/2016, 1:03 PM

## 2016-10-09 DIAGNOSIS — S61215A Laceration without foreign body of left ring finger without damage to nail, initial encounter: Secondary | ICD-10-CM | POA: Diagnosis not present

## 2016-10-09 DIAGNOSIS — S61213A Laceration without foreign body of left middle finger without damage to nail, initial encounter: Secondary | ICD-10-CM | POA: Diagnosis not present

## 2016-10-21 ENCOUNTER — Other Ambulatory Visit: Payer: Self-pay | Admitting: Gastroenterology

## 2016-10-21 DIAGNOSIS — R1032 Left lower quadrant pain: Secondary | ICD-10-CM | POA: Diagnosis not present

## 2016-10-23 ENCOUNTER — Other Ambulatory Visit: Payer: Self-pay | Admitting: Family Medicine

## 2016-10-23 DIAGNOSIS — H2513 Age-related nuclear cataract, bilateral: Secondary | ICD-10-CM | POA: Diagnosis not present

## 2016-10-23 DIAGNOSIS — H04123 Dry eye syndrome of bilateral lacrimal glands: Secondary | ICD-10-CM | POA: Diagnosis not present

## 2016-10-23 DIAGNOSIS — H52223 Regular astigmatism, bilateral: Secondary | ICD-10-CM | POA: Diagnosis not present

## 2016-10-23 DIAGNOSIS — G43009 Migraine without aura, not intractable, without status migrainosus: Secondary | ICD-10-CM

## 2016-10-23 DIAGNOSIS — H524 Presbyopia: Secondary | ICD-10-CM | POA: Diagnosis not present

## 2016-10-23 DIAGNOSIS — E119 Type 2 diabetes mellitus without complications: Secondary | ICD-10-CM | POA: Diagnosis not present

## 2016-10-23 DIAGNOSIS — H5213 Myopia, bilateral: Secondary | ICD-10-CM | POA: Diagnosis not present

## 2016-10-23 LAB — HM DIABETES EYE EXAM

## 2016-10-24 ENCOUNTER — Ambulatory Visit
Admission: RE | Admit: 2016-10-24 | Discharge: 2016-10-24 | Disposition: A | Discharge status: Home / Self Care | Payer: PPO | Source: Ambulatory Visit | Attending: Gastroenterology | Admitting: Gastroenterology

## 2016-10-24 ENCOUNTER — Encounter: Payer: Self-pay | Admitting: Family Medicine

## 2016-10-24 ENCOUNTER — Other Ambulatory Visit
Admission: RE | Admit: 2016-10-24 | Discharge: 2016-10-24 | Disposition: A | Discharge status: Home / Self Care | Payer: PPO | Source: Ambulatory Visit | Attending: Gastroenterology | Admitting: Gastroenterology

## 2016-10-24 DIAGNOSIS — R102 Pelvic and perineal pain: Secondary | ICD-10-CM | POA: Diagnosis not present

## 2016-10-24 DIAGNOSIS — R935 Abnormal findings on diagnostic imaging of other abdominal regions, including retroperitoneum: Secondary | ICD-10-CM | POA: Insufficient documentation

## 2016-10-24 DIAGNOSIS — R932 Abnormal findings on diagnostic imaging of liver and biliary tract: Secondary | ICD-10-CM | POA: Insufficient documentation

## 2016-10-24 DIAGNOSIS — R1032 Left lower quadrant pain: Secondary | ICD-10-CM | POA: Insufficient documentation

## 2016-10-24 DIAGNOSIS — R109 Unspecified abdominal pain: Secondary | ICD-10-CM | POA: Diagnosis not present

## 2016-10-24 HISTORY — DX: Unspecified asthma, uncomplicated: J45.909

## 2016-10-24 HISTORY — DX: Malignant (primary) neoplasm, unspecified: C80.1

## 2016-10-24 LAB — CREATININE, SERUM
CREATININE: 0.94 mg/dL (ref 0.44–1.00)
GFR calc Af Amer: >60 mL/min (ref >60)
GFR calc non Af Amer: >60 mL/min (ref >60)

## 2016-10-24 LAB — BUN: BUN: 20 mg/dL (ref 6–20)

## 2016-10-24 MED ORDER — IOPAMIDOL (ISOVUE-300) INJECTION 61%
100.0000 mL | Freq: Once | INTRAVENOUS | Status: AC | PRN
  Administered 2016-10-24: 100 mL via INTRAVENOUS

## 2016-10-25 ENCOUNTER — Telehealth: Payer: Self-pay | Admitting: Family Medicine

## 2016-10-25 ENCOUNTER — Telehealth: Payer: Self-pay

## 2016-10-25 DIAGNOSIS — R935 Abnormal findings on diagnostic imaging of other abdominal regions, including retroperitoneum: Secondary | ICD-10-CM | POA: Diagnosis not present

## 2016-10-25 NOTE — Telephone Encounter (Signed)
FYI: Pt states she had a CT scan yesterday and was called today to be informed that she may have some cancer between the left ovary and the colon. Pt just wanted to inform Dr Ancil Boozer. She is going this morning for labs to be done.

## 2016-10-25 NOTE — Telephone Encounter (Signed)
  Oncology Nurse Navigator Documentation Received telephone referral from Jessica Blazer PA at [facility] for omental caking on CT. I have spoken with Jessica Burgess and appointment arranged for 8/15 at 1330 with Jessica Burgess. Gyn Onc available as needed as well. She has had her CA 125 and CEA drawn today. Navigator Location: [facility] (10/25/16 1200) Referral date to RadOnc/MedOnc: 10/25/16 (10/25/16 1200) )Navigator Encounter Type: Telephone (10/25/16 1200) Telephone: Jessica Burgess Call;Appt Confirmation/Clarification (10/25/16 1200)                                                  Time Spent with Patient: 15 (10/25/16 1200)

## 2016-10-25 NOTE — Telephone Encounter (Signed)
Please check on her for me

## 2016-10-28 ENCOUNTER — Other Ambulatory Visit: Payer: Self-pay | Admitting: Family Medicine

## 2016-10-28 ENCOUNTER — Ambulatory Visit (INDEPENDENT_AMBULATORY_CARE_PROVIDER_SITE_OTHER): Payer: PPO | Admitting: Obstetrics and Gynecology

## 2016-10-28 ENCOUNTER — Encounter: Payer: Self-pay | Admitting: Obstetrics and Gynecology

## 2016-10-28 VITALS — BP 111/70 | HR 83 | Ht 63.0 in | Wt 178.6 lb

## 2016-10-28 DIAGNOSIS — Z124 Encounter for screening for malignant neoplasm of cervix: Secondary | ICD-10-CM

## 2016-10-28 DIAGNOSIS — G4700 Insomnia, unspecified: Secondary | ICD-10-CM

## 2016-10-28 DIAGNOSIS — R188 Other ascites: Secondary | ICD-10-CM

## 2016-10-28 DIAGNOSIS — Z90711 Acquired absence of uterus with remaining cervical stump: Secondary | ICD-10-CM

## 2016-10-28 DIAGNOSIS — R935 Abnormal findings on diagnostic imaging of other abdominal regions, including retroperitoneum: Secondary | ICD-10-CM | POA: Diagnosis not present

## 2016-10-28 DIAGNOSIS — N841 Polyp of cervix uteri: Secondary | ICD-10-CM

## 2016-10-28 NOTE — Progress Notes (Signed)
GYN ENCOUNTER NOTE  Subjective:       Jessica Burgess is a 61 y.o. G0P0 female is here for gynecologic evaluation of the following issues:  1. Cervical polyp  Cervical polyp 61 year old female, not sexually active, menopausal, not on hormone replacement therapy, presents in referral for evaluation of possible cervical polyp area patient also has GI symptoms including lower abdominal pain, left lower quadrant and painful bowel movements described as bowel function having associated "shards of glass pain" during defecation. Recent CT scan has demonstrated a probable omental cake and abdominal ascites.  Patient is status post [place] in the past.  No first-degree family history of breast cancer, colon cancer, or ovarian cancer. A paternal aunt reportedly had breast cancer and lung cancer.  Obstetric History OB History  Gravida Para Term Preterm AB Living  0            SAB TAB Ectopic Multiple Live Births                   Past Medical History:  Diagnosis Date  . Allergic rhinitis, cause unspecified   . Anxiety state, unspecified   . Arthritis   . Asthma    only when sick   . Backache, unspecified   . Bronchitis    hx of when get sick  . Cancer ([place])    skin cancer , basal cell   . Cervicalgia   . Complication of anesthesia   . Dermatophytosis of nail   . Dysmetabolic syndrome X   . Encounter for long-term (current) use of other medications   . Esophageal reflux   . Insomnia, unspecified   . Leukocytosis, unspecified   . Migraine without aura, without mention of intractable migraine without mention of status migrainosus   . Other and unspecified hyperlipidemia   . Other malaise and fatigue   . Overweight(278.02)   . PONV (postoperative nausea and vomiting)   . Spinal stenosis in cervical region   . Symptomatic menopausal or female climacteric states   . Unspecified disorder of skin and subcutaneous tissue   . Unspecified vitamin D deficiency     Past Surgical History:   Procedure Laterality Date  . ABDOMINAL HYSTERECTOMY    . ANTERIOR CERVICAL DECOMP/DISCECTOMY FUSION N/A 06/07/2015   Procedure: Cervical three - four and Cervical six- seven anterior cervical decompression with fusion interbody prosthesis plating and bonegraft;  Surgeon: Newman Pies, MD;  Location: [place] [facility];  Service: Neurosurgery;  Laterality: N/A;  C34 and C67 anterior cervical decompression with fusion interbody prosthesis plating and bonegraft  . BACK SURGERY     x2 Lower   . EVACUATION OF CERVICAL HEMATOMA N/A 06/14/2015   Procedure: EVACUATION OF CERVICAL HEMATOMA;  Surgeon: Newman Pies, MD;  Location: [place] [facility];  Service: Neurosurgery;  Laterality: N/A;  . NECK SURGERY     x3  . TONSILLECTOMY      Current Outpatient Prescriptions on File Prior to Visit  Medication Sig Dispense Refill  . albuterol (PROVENTIL HFA;VENTOLIN HFA) 108 (90 Base) MCG/ACT inhaler Inhale 2 puffs into the lungs every 6 (six) hours as needed for wheezing or shortness of breath. 1 Inhaler 2  . albuterol (PROVENTIL) (2.5 MG/3ML) 0.083% nebulizer solution Take 3 mLs (2.5 mg total) by nebulization every 6 (six) hours as needed for wheezing or shortness of breath. 75 mL 2  . Ascorbic Acid (VITAMIN C PO) Take by mouth.    Marland Kitchen atorvastatin (LIPITOR) 40 MG tablet Take 1 tablet (40 mg  total) by mouth daily. 90 tablet 4  . Cholecalciferol (VITAMIN D-1000 MAX ST) 1000 UNITS tablet Take 1 tablet by mouth 2 (two) times daily.    . Cyanocobalamin (B-12 PO) Take 1 tablet by mouth daily.     Marland Kitchen docusate sodium (COLACE) 100 MG capsule Take 1 capsule (100 mg total) by mouth 2 (two) times daily. 60 capsule 0  . DULoxetine (CYMBALTA) 60 MG capsule take 1 capsule by mouth once daily 90 capsule 1  . estradiol (ESTRACE) 0.5 MG tablet take 1 tablet by mouth once daily 90 tablet 4  . ferrous sulfate (IRON SUPPLEMENT) 325 (65 FE) MG tablet Take 1 tablet by mouth daily.    . fluticasone (FLONASE) 50 MCG/ACT nasal spray Place 2  sprays into both nostrils as needed. 16 g 5  . gabapentin (NEURONTIN) 300 MG capsule Take 1 capsule (300 mg total) by mouth 3 (three) times daily. Titrate to up to 300 mg three times daily 270 capsule 0  . levocetirizine (XYZAL) 5 MG tablet Take 1 tablet (5 mg total) by mouth every evening. 15 tablet 0  . Magnesium Oxide 500 MG CAPS Take 500 mg by mouth 2 (two) times daily.     . metFORMIN (GLUCOPHAGE) 500 MG tablet Take 1 tablet (500 mg total) by mouth daily. 90 tablet 1  . Multiple Vitamins-Minerals (MULTIVITAMIN PO) Take 1 tablet by mouth daily.     . Omega-3 Fatty Acids (FISH OIL PO) Take by mouth.    . oxyCODONE-acetaminophen (PERCOCET) 10-325 MG tablet Take 1 tablet by mouth every 4 (four) hours as needed for pain. 84 tablet 0  . potassium gluconate 595 MG TABS tablet Take 595 mg by mouth daily.    . SUMAtriptan (IMITREX) 100 MG tablet take 1 tablet by mouth if needed for migraines as directed 27 tablet 1   No current facility-administered medications on file prior to visit.     No Known Allergies  Social History   Social History  . Marital status: Single    Spouse name: N/A  . Number of children: 0  . Years of education: 12+   Occupational History  .  Disabled  . Disabled     Social History Main Topics  . Smoking status: Former Smoker    Packs/day: 1.50    Years: 20.00    Types: Cigarettes    Start date: 03/19/1979    Quit date: 08/25/1999  . Smokeless tobacco: Never Used  . Alcohol use 0.0 oz/week     Comment: wine occassionally  . Drug use: No  . Sexual activity: No   Other Topics Concern  . Not on file   Social History Narrative   Patient is single.    Patient lives with roommates.    Patient on disability    Patient has no children.    Patient has some college     Family History  Problem Relation Age of Onset  . Depression Mother   . Migraines Mother   . Dementia Father   . Hyperlipidemia Brother   . Breast cancer Paternal Aunt        50s    The  following portions of the patient's history were reviewed and updated as appropriate: allergies, current medications, past family history, past medical history, past social history, past surgical history and problem list.  Review of Systems Review of Systems  Constitutional: Negative for chills, diaphoresis, fever, malaise/fatigue and weight loss.  HENT: Negative.   Eyes: Negative.   Respiratory:  Negative.   Cardiovascular: Negative.   Gastrointestinal: Positive for abdominal pain. Negative for blood in stool, constipation and diarrhea.       Left lower quadrant abdominal pain; during defecation patient experienced sharp stabbing pain described as a bowel movement moving through shards of glass  Genitourinary: Negative.   Musculoskeletal: Negative.   Skin: Negative.   Neurological: Negative.   Endo/Heme/Allergies: Negative.   Psychiatric/Behavioral: Negative.      Objective:   BP 111/70   Pulse 83   Ht 5\' 3"  (1.6 m)   Wt 178 lb 9.6 oz (81 kg)   BMI 31.64 kg/m  CONSTITUTIONAL: Well-developed, well-nourished female in no acute distress.  HENT:  Normocephalic, atraumatic.  NECK: Normal range of motion, supple, no masses.  Normal thyroid.  SKIN: Skin is warm and dry. No rash noted. Not diaphoretic. No erythema. No pallor. [place]: Alert and oriented to person, place, and time. PSYCHIATRIC: Normal mood and affect. Normal behavior. Normal judgment and thought content. CARDIOVASCULAR:Not Examined RESPIRATORY: Not Examined BREASTS: Not Examined ABDOMEN: Soft, non distended; Non tender.  No Organomegaly.No pelvic mass  PELVIC:  External Genitalia: Normal  BUS: Normal  vagina: Moderate to severe atrophy  Cervix: Mild cervical motion tenderness   Uterus: Surgically absent   Adnexa: Normal; nonpalpable and nontender   RV: Normal external exam; dermal sphincter tone; no rectal masses   Bladder: Nontender MUSCULOSKELETAL: Normal range of motion. No tenderness.  No cyanosis,  clubbing, or edema.     Assessment:   1. Other ascites  2. Abnormal CT scan, pelvis, possible omental caking and ascites  3. Status post laparoscopic supracervical hysterectomy  4. History of cervical polyp, not present on exam  5. Vaginal atrophy   Plan:   1. Referral to GYN oncology as scheduled later this week 2. No other interventions at this time 3. Malignancy workup was discussed  Brayton Mars, MD  Note: This dictation was prepared with Dragon dictation along with smaller phrase technology. Any transcriptional errors that result from this process are unintentional.

## 2016-10-28 NOTE — Telephone Encounter (Signed)
Patient requesting refill of Ambien to [facility].

## 2016-10-28 NOTE — Progress Notes (Signed)
[place]  Telephone:([phone]) [phone] Fax:(336) [phone]  ID: Jessica Burgess OB: 11-14-1955  MR#: [id number]  DPO#:[id number]  Patient Care Team: Steele Sizer, MD as PCP - General (Family Medicine) Clent Jacks, RN as Registered Nurse  CHIEF COMPLAINT: Peritoneal carcinomatosis.  INTERVAL HISTORY: Patient is a 61 year old female who over the past several months had progressively worsening painful movements. More recently she has noted increased abdominal pain, bloating, and distention. Subsequent CT scan revealed omental caking concerning for underlying malignancy. She otherwise feels well. She has no neurologic complaints. She denies any recent fevers. She has a fair appetite, but denies weight loss. She admits to occasional nausea, but denies any vomiting, constipation, or diarrhea. She has no chest pain or shortness of breath. She has no urinary complaints. Patient otherwise feels well and offers no further specific complaints.  REVIEW OF SYSTEMS:   Review of Systems  Constitutional: Negative.  Negative for fever, malaise/fatigue and weight loss.  Respiratory: Negative.  Negative for cough and shortness of breath.   Cardiovascular: Negative.  Negative for chest pain and leg swelling.  Gastrointestinal: Positive for abdominal pain and nausea. Negative for blood in stool, constipation, diarrhea, melena and vomiting.  Genitourinary: Negative.   Musculoskeletal: Negative.   Skin: Negative.  Negative for rash.  Neurological: Negative.  Negative for sensory change and weakness.  Psychiatric/Behavioral: The patient is nervous/anxious.     As per HPI. Otherwise, a complete review of systems is negative.  PAST MEDICAL HISTORY: Past Medical History:  Diagnosis Date  . Allergic rhinitis, cause unspecified   . Anxiety state, unspecified   . Arthritis   . Asthma    only when sick   . Backache, unspecified   . Bronchitis    hx of when get sick  . Cancer ([place])     skin cancer , basal cell   . Cervicalgia   . Complication of anesthesia   . Dermatophytosis of nail   . Dysmetabolic syndrome X   . Encounter for long-term (current) use of other medications   . Esophageal reflux   . Insomnia, unspecified   . Leukocytosis, unspecified   . Migraine without aura, without mention of intractable migraine without mention of status migrainosus   . Other and unspecified hyperlipidemia   . Other malaise and fatigue   . Overweight(278.02)   . PONV (postoperative nausea and vomiting)   . Spinal stenosis in cervical region   . Symptomatic menopausal or female climacteric states   . Unspecified disorder of skin and subcutaneous tissue   . Unspecified vitamin D deficiency     PAST SURGICAL HISTORY: Past Surgical History:  Procedure Laterality Date  . ABDOMINAL HYSTERECTOMY    . ANTERIOR CERVICAL DECOMP/DISCECTOMY FUSION N/A 06/07/2015   Procedure: Cervical three - four and Cervical six- seven anterior cervical decompression with fusion interbody prosthesis plating and bonegraft;  Surgeon: Newman Pies, MD;  Location: [place] [facility];  Service: Neurosurgery;  Laterality: N/A;  C34 and C67 anterior cervical decompression with fusion interbody prosthesis plating and bonegraft  . BACK SURGERY     x2 Lower   . EVACUATION OF CERVICAL HEMATOMA N/A 06/14/2015   Procedure: EVACUATION OF CERVICAL HEMATOMA;  Surgeon: Newman Pies, MD;  Location: [place] [facility];  Service: Neurosurgery;  Laterality: N/A;  . NECK SURGERY     x3  . TONSILLECTOMY      FAMILY HISTORY: Family History  Problem Relation Age of Onset  . Depression Mother   . Migraines Mother   .  Dementia Father   . Hyperlipidemia Brother   . Breast cancer Paternal Aunt        51s    ADVANCED DIRECTIVES (Y/N):  N  HEALTH MAINTENANCE: Social History  Substance Use Topics  . Smoking status: Former Smoker    Packs/day: 1.50    Years: 20.00    Types: Cigarettes    Start date: 03/19/1979    Quit  date: 08/25/1999  . Smokeless tobacco: Never Used  . Alcohol use 0.0 oz/week     Comment: wine occassionally     Colonoscopy:  PAP:  Bone density:  Lipid panel:  No Known Allergies  Current Outpatient Prescriptions  Medication Sig Dispense Refill  . albuterol (PROVENTIL HFA;VENTOLIN HFA) 108 (90 Base) MCG/ACT inhaler Inhale 2 puffs into the lungs every 6 (six) hours as needed for wheezing or shortness of breath. 1 Inhaler 2  . albuterol (PROVENTIL) (2.5 MG/3ML) 0.083% nebulizer solution Take 3 mLs (2.5 mg total) by nebulization every 6 (six) hours as needed for wheezing or shortness of breath. 75 mL 2  . Ascorbic Acid (VITAMIN C PO) Take by mouth.    Marland Kitchen atorvastatin (LIPITOR) 40 MG tablet Take 1 tablet (40 mg total) by mouth daily. 90 tablet 4  . Cholecalciferol (VITAMIN D-1000 MAX ST) 1000 UNITS tablet Take 1 tablet by mouth 2 (two) times daily.    . Cyanocobalamin (B-12 PO) Take 1 tablet by mouth daily.     Marland Kitchen docusate sodium (COLACE) 100 MG capsule Take 1 capsule (100 mg total) by mouth 2 (two) times daily. 60 capsule 0  . DULoxetine (CYMBALTA) 60 MG capsule take 1 capsule by mouth once daily 90 capsule 1  . estradiol (ESTRACE) 0.5 MG tablet take 1 tablet by mouth once daily 90 tablet 4  . ferrous sulfate (IRON SUPPLEMENT) 325 (65 FE) MG tablet Take 1 tablet by mouth daily.    . fluticasone (FLONASE) 50 MCG/ACT nasal spray Place 2 sprays into both nostrils as needed. 16 g 5  . gabapentin (NEURONTIN) 300 MG capsule Take 1 capsule (300 mg total) by mouth 3 (three) times daily. Titrate to up to 300 mg three times daily 270 capsule 0  . levocetirizine (XYZAL) 5 MG tablet Take 1 tablet (5 mg total) by mouth every evening. 15 tablet 0  . Magnesium Oxide 500 MG CAPS Take 500 mg by mouth 2 (two) times daily.     . metFORMIN (GLUCOPHAGE) 500 MG tablet Take 1 tablet (500 mg total) by mouth daily. 90 tablet 1  . Multiple Vitamins-Minerals (MULTIVITAMIN PO) Take 1 tablet by mouth daily.     .  Omega-3 Fatty Acids (FISH OIL PO) Take by mouth.    . oxyCODONE-acetaminophen (PERCOCET) 10-325 MG tablet Take 1 tablet by mouth every 4 (four) hours as needed for pain. 84 tablet 0  . potassium gluconate 595 MG TABS tablet Take 595 mg by mouth daily.    . SUMAtriptan (IMITREX) 100 MG tablet take 1 tablet by mouth if needed for migraines as directed 27 tablet 1  . zolpidem (AMBIEN CR) 6.25 MG CR tablet take 1 tablet at bedtime 90 tablet 0   No current facility-administered medications for this visit.     OBJECTIVE: Vitals:   10/30/16 1419  BP: 120/73  Pulse: 67  Resp: 20  Temp: (!) 96.9 F (36.1 C)     Body mass index is 32.42 kg/m.    ECOG FS:1 - Symptomatic but completely ambulatory  General: Well-developed, well-nourished, no acute distress.  Eyes: Pink conjunctiva, anicteric sclera. HEENT: Normocephalic, moist mucous membranes, clear oropharnyx. Lungs: Clear to auscultation bilaterally. Heart: Regular rate and rhythm. No rubs, murmurs, or gallops. Abdomen: Soft, nontender, nondistended. No organomegaly noted, normoactive bowel sounds. Musculoskeletal: No edema, cyanosis, or clubbing. Neuro: Alert, answering all questions appropriately. Cranial nerves grossly intact. Skin: No rashes or petechiae noted. Psych: Normal affect. Lymphatics: No cervical, calvicular, axillary or inguinal LAD.   LAB RESULTS:  Lab Results  Component Value Date   NA 140 05/17/2016   K 5.1 05/17/2016   CL 103 05/17/2016   CO2 30 05/17/2016   GLUCOSE 100 (H) 05/17/2016   BUN 20 10/24/2016   CREATININE 0.94 10/24/2016   CALCIUM 9.1 05/17/2016   PROT 6.4 05/17/2016   ALBUMIN 4.0 05/17/2016   AST 20 05/17/2016   ALT 32 (H) 05/17/2016   ALKPHOS 70 05/17/2016   BILITOT 0.5 05/17/2016   GFRNONAA >60 10/24/2016   GFRAA >60 10/24/2016    Lab Results  Component Value Date   WBC 7.8 05/30/2015   HGB 13.9 05/30/2015   HCT 39.4 05/30/2015   MCV 88.9 05/30/2015   PLT 281 05/30/2015      STUDIES: Dg Chest 2 View  Result Date: 10/04/2016 CLINICAL DATA:  Congestion and shortness of Breath EXAM: CHEST  2 VIEW COMPARISON:  07/06/2004 FINDINGS: The heart size and mediastinal contours are within normal limits. Both lungs are clear. The visualized skeletal structures shows postsurgical changes in the cervical spine. Degenerative change in the thoracic spine is noted. IMPRESSION: No active cardiopulmonary disease. Electronically Signed   By: Inez Catalina M.D.   On: 10/04/2016 11:30   Ct Abdomen Pelvis W Contrast  Result Date: 10/24/2016 CLINICAL DATA:  61 year old female with acute on chronic left abdominal and pelvic pain, worsening over the last 2 weeks. EXAM: CT ABDOMEN AND PELVIS WITH CONTRAST TECHNIQUE: Multidetector CT imaging of the abdomen and pelvis was performed using the standard protocol following bolus administration of intravenous contrast. CONTRAST:  154mL ISOVUE-300 IOPAMIDOL (ISOVUE-300) INJECTION 61% COMPARISON:  03/04/2014 CT a FINDINGS: Lower chest: No acute abnormality. Hepatobiliary: Probable mild hepatic steatosis noted. No focal hepatic lesions identified. Cholecystectomy identified. No biliary dilatation. Pancreas: Unremarkable Spleen: Unremarkable Adrenals/Urinary Tract: The kidneys, adrenal glands and bladder are unremarkable. Stomach/Bowel: Stomach is within normal limits. Appendix appears normal. No evidence of definite bowel wall thickening, distention, or inflammatory changes. Vascular/Lymphatic: Aortic atherosclerosis. No enlarged abdominal or pelvic lymph nodes. Reproductive: Status post hysterectomy. No adnexal masses. Other: Omental caking noted, greatest within the pelvis. There may be a few of very small peritoneal nodules present. A tiny amount of free pelvic fluid is identified. Musculoskeletal: No acute abnormality. No suspicious focal bony lesions identified. Multilevel degenerative changes within the lumbar spine noted as well as posterior fusion at  L5-S1. IMPRESSION: Omental caking highly suspicious for metastatic disease. No definite primary tumor identified. Possible tiny peritoneal nodules. Small amount of free pelvic fluid. Question mild hepatic steatosis. These results will be called to the ordering clinician or representative by the Radiologist Assistant, and communication documented in the PACS or zVision Dashboard. Electronically Signed   By: Margarette Canada M.D.   On: 10/24/2016 14:22    ASSESSMENT: Peritoneal carcinomatosis  PLAN:  1. Peritoneal carcinomatosis:  CT scan results reviewed independently and reported as above with significant omental caking highly suspicious for underlying malignancy. Patient also has a CA-125 greater than 2000. Will get a CT-guided biopsy to confirm the diagnosis. Patient will also have consultation with gynecology oncology  in the near future for consideration of optimal debulking. Patient will likely require chemotherapy using carboplatinum and Taxol. Return to clinic in 1-2 days after her biopsy to discuss the results and additional treatment planning.  Approximately 60 minutes was spent in discussion of which greater than 50% was consultation.  Patient expressed understanding and was in agreement with this plan. She also understands that She can call clinic at any time with any questions, concerns, or complaints.   Cancer Staging No matching staging information was found for the patient.  Lloyd Huger, MD   11/01/2016 11:15 AM

## 2016-10-28 NOTE — Telephone Encounter (Signed)
Left message to inform patient Dr. Ancil Boozer received her message and is thinking about her.

## 2016-10-28 NOTE — Patient Instructions (Signed)
1. Pap smear is done today 2. No evidence of cervical polyp 3. Review of CT scan is suspicious for possible omental cake and mild ascites, no adnexal masses, no lymphadenopathy, no hepatic lesions other than evidence of mild steatosis; patient is to have follow-up with GYN oncology on Wednesday. She is strongly encouraged to keep that appointment.

## 2016-10-29 LAB — OVARIAN MALIGNANCY RISK-ROMA
Cancer Antigen (CA) 125: 2047 U/mL — ABNORMAL HIGH (ref 0.0–38.1)
HE4: 807.3 pmol/L — ABNORMAL HIGH (ref 0.0–96.5)
POSTMENOPAUSAL ROMA: 9.88 — AB
PREMENOPAUSAL ROMA: 9.88 — AB

## 2016-10-29 LAB — POSTMENOPAUSAL INTERP: HIGH

## 2016-10-29 LAB — PREMENOPAUSAL INTERP: HIGH

## 2016-10-29 LAB — CEA: CEA1: 2.4 ng/mL (ref 0.0–4.7)

## 2016-10-30 ENCOUNTER — Telehealth: Payer: Self-pay | Admitting: Family Medicine

## 2016-10-30 ENCOUNTER — Inpatient Hospital Stay: Payer: PPO | Attending: Oncology | Admitting: Oncology

## 2016-10-30 ENCOUNTER — Telehealth: Payer: Self-pay

## 2016-10-30 VITALS — BP 120/73 | HR 67 | Temp 96.9°F | Resp 20 | Wt 183.0 lb

## 2016-10-30 DIAGNOSIS — J45909 Unspecified asthma, uncomplicated: Secondary | ICD-10-CM | POA: Insufficient documentation

## 2016-10-30 DIAGNOSIS — Z85828 Personal history of other malignant neoplasm of skin: Secondary | ICD-10-CM | POA: Diagnosis not present

## 2016-10-30 DIAGNOSIS — R970 Elevated carcinoembryonic antigen [CEA]: Secondary | ICD-10-CM | POA: Insufficient documentation

## 2016-10-30 DIAGNOSIS — E559 Vitamin D deficiency, unspecified: Secondary | ICD-10-CM | POA: Diagnosis not present

## 2016-10-30 DIAGNOSIS — E785 Hyperlipidemia, unspecified: Secondary | ICD-10-CM | POA: Diagnosis not present

## 2016-10-30 DIAGNOSIS — I700 Atherosclerosis of aorta: Secondary | ICD-10-CM | POA: Insufficient documentation

## 2016-10-30 DIAGNOSIS — C801 Malignant (primary) neoplasm, unspecified: Secondary | ICD-10-CM | POA: Insufficient documentation

## 2016-10-30 DIAGNOSIS — Z87891 Personal history of nicotine dependence: Secondary | ICD-10-CM | POA: Insufficient documentation

## 2016-10-30 DIAGNOSIS — K219 Gastro-esophageal reflux disease without esophagitis: Secondary | ICD-10-CM | POA: Insufficient documentation

## 2016-10-30 DIAGNOSIS — C786 Secondary malignant neoplasm of retroperitoneum and peritoneum: Secondary | ICD-10-CM | POA: Insufficient documentation

## 2016-10-30 LAB — PAPIG, HPV, RFX 16/18
HPV, HIGH-RISK: NEGATIVE
PAP SMEAR COMMENT: 0

## 2016-10-30 NOTE — Telephone Encounter (Signed)
  Oncology Nurse Navigator Documentation Contacted Jessica Burgess via telephone. Notified her that we attempted to get her to see Gyn Onc, Dr. Fransisca Connors, in clinic today before she left. He does not her to wait until next week to be seen. Dr. Fransisca Connors would like to see her at [place] this Friday, 8/17. His office will contact her with appointment details. Navigator Location: [facility] (10/30/16 1700)   )Navigator Encounter Type: Telephone (10/30/16 1700) Telephone: Outgoing Call (10/30/16 1700)                                                  Time Spent with Patient: 15 (10/30/16 1700)

## 2016-10-30 NOTE — Progress Notes (Signed)
Patient here today for initial visit regarding peritoneal carcinomatosis. Patient denies pain today.

## 2016-10-30 NOTE — Telephone Encounter (Signed)
Spoke to patient, she is worried about the increase in abdominal pain and advised her to discuss pain management with Dr. Grayland Ormond.  She is scared but has good support from friends

## 2016-11-01 DIAGNOSIS — C800 Disseminated malignant neoplasm, unspecified: Secondary | ICD-10-CM | POA: Diagnosis not present

## 2016-11-01 DIAGNOSIS — R935 Abnormal findings on diagnostic imaging of other abdominal regions, including retroperitoneum: Secondary | ICD-10-CM | POA: Diagnosis not present

## 2016-11-01 DIAGNOSIS — Z006 Encounter for examination for normal comparison and control in clinical research program: Secondary | ICD-10-CM | POA: Insufficient documentation

## 2016-11-04 ENCOUNTER — Other Ambulatory Visit: Payer: Self-pay

## 2016-11-05 ENCOUNTER — Ambulatory Visit: Admission: RE | Admit: 2016-11-05 | Payer: PPO | Source: Ambulatory Visit

## 2016-11-05 DIAGNOSIS — M4802 Spinal stenosis, cervical region: Secondary | ICD-10-CM | POA: Diagnosis not present

## 2016-11-05 DIAGNOSIS — E785 Hyperlipidemia, unspecified: Secondary | ICD-10-CM | POA: Diagnosis not present

## 2016-11-05 DIAGNOSIS — Z7984 Long term (current) use of oral hypoglycemic drugs: Secondary | ICD-10-CM | POA: Diagnosis not present

## 2016-11-05 DIAGNOSIS — K219 Gastro-esophageal reflux disease without esophagitis: Secondary | ICD-10-CM | POA: Diagnosis not present

## 2016-11-05 DIAGNOSIS — C786 Secondary malignant neoplasm of retroperitoneum and peritoneum: Secondary | ICD-10-CM | POA: Diagnosis not present

## 2016-11-05 DIAGNOSIS — Z87891 Personal history of nicotine dependence: Secondary | ICD-10-CM | POA: Diagnosis not present

## 2016-11-05 DIAGNOSIS — C801 Malignant (primary) neoplasm, unspecified: Secondary | ICD-10-CM | POA: Diagnosis not present

## 2016-11-05 DIAGNOSIS — E668 Other obesity: Secondary | ICD-10-CM | POA: Diagnosis not present

## 2016-11-05 DIAGNOSIS — Z981 Arthrodesis status: Secondary | ICD-10-CM | POA: Diagnosis not present

## 2016-11-05 DIAGNOSIS — C569 Malignant neoplasm of unspecified ovary: Secondary | ICD-10-CM | POA: Insufficient documentation

## 2016-11-05 DIAGNOSIS — Z79899 Other long term (current) drug therapy: Secondary | ICD-10-CM | POA: Diagnosis not present

## 2016-11-05 DIAGNOSIS — M1990 Unspecified osteoarthritis, unspecified site: Secondary | ICD-10-CM | POA: Diagnosis not present

## 2016-11-05 DIAGNOSIS — J45909 Unspecified asthma, uncomplicated: Secondary | ICD-10-CM | POA: Diagnosis not present

## 2016-11-05 DIAGNOSIS — C763 Malignant neoplasm of pelvis: Secondary | ICD-10-CM | POA: Diagnosis not present

## 2016-11-05 DIAGNOSIS — Z6832 Body mass index (BMI) 32.0-32.9, adult: Secondary | ICD-10-CM | POA: Diagnosis not present

## 2016-11-05 DIAGNOSIS — R7303 Prediabetes: Secondary | ICD-10-CM | POA: Diagnosis not present

## 2016-11-07 ENCOUNTER — Inpatient Hospital Stay: Payer: PPO | Admitting: Oncology

## 2016-11-12 ENCOUNTER — Ambulatory Visit (INDEPENDENT_AMBULATORY_CARE_PROVIDER_SITE_OTHER): Payer: PPO | Admitting: Family Medicine

## 2016-11-12 ENCOUNTER — Encounter: Payer: Self-pay | Admitting: Family Medicine

## 2016-11-12 VITALS — BP 122/70 | HR 96 | Temp 97.6°F | Resp 16 | Ht 63.0 in | Wt 181.5 lb

## 2016-11-12 DIAGNOSIS — G43009 Migraine without aura, not intractable, without status migrainosus: Secondary | ICD-10-CM

## 2016-11-12 DIAGNOSIS — C800 Disseminated malignant neoplasm, unspecified: Secondary | ICD-10-CM | POA: Diagnosis not present

## 2016-11-12 DIAGNOSIS — J3089 Other allergic rhinitis: Secondary | ICD-10-CM

## 2016-11-12 DIAGNOSIS — E039 Hypothyroidism, unspecified: Secondary | ICD-10-CM

## 2016-11-12 DIAGNOSIS — E785 Hyperlipidemia, unspecified: Secondary | ICD-10-CM

## 2016-11-12 DIAGNOSIS — G8929 Other chronic pain: Secondary | ICD-10-CM

## 2016-11-12 DIAGNOSIS — M5441 Lumbago with sciatica, right side: Secondary | ICD-10-CM

## 2016-11-12 DIAGNOSIS — Z23 Encounter for immunization: Secondary | ICD-10-CM

## 2016-11-12 DIAGNOSIS — M542 Cervicalgia: Secondary | ICD-10-CM

## 2016-11-12 DIAGNOSIS — C569 Malignant neoplasm of unspecified ovary: Secondary | ICD-10-CM | POA: Diagnosis not present

## 2016-11-12 DIAGNOSIS — M5442 Lumbago with sciatica, left side: Secondary | ICD-10-CM

## 2016-11-12 DIAGNOSIS — F341 Dysthymic disorder: Secondary | ICD-10-CM

## 2016-11-12 DIAGNOSIS — R0981 Nasal congestion: Secondary | ICD-10-CM | POA: Diagnosis not present

## 2016-11-12 DIAGNOSIS — G4700 Insomnia, unspecified: Secondary | ICD-10-CM | POA: Diagnosis not present

## 2016-11-12 DIAGNOSIS — F329 Major depressive disorder, single episode, unspecified: Secondary | ICD-10-CM

## 2016-11-12 DIAGNOSIS — E8881 Metabolic syndrome: Secondary | ICD-10-CM | POA: Diagnosis not present

## 2016-11-12 MED ORDER — FLUTICASONE PROPIONATE 50 MCG/ACT NA SUSP: 2.0000 | NASAL | 5 refills | Status: DC | PRN

## 2016-11-12 MED ORDER — METFORMIN HCL 500 MG PO TABS: 500.0000 mg | ORAL_TABLET | Freq: Every day | ORAL | 1 refills | Status: DC

## 2016-11-12 MED ORDER — OXYCODONE-ACETAMINOPHEN 10-325 MG PO TABS: 1.0000 | ORAL_TABLET | ORAL | 0 refills | Status: DC | PRN

## 2016-11-12 MED ORDER — OXYCODONE-ACETAMINOPHEN 10-325 MG PO TABS: 1.0000 | ORAL_TABLET | Freq: Three times a day (TID) | ORAL | 0 refills | Status: DC | PRN

## 2016-11-12 MED ORDER — GABAPENTIN 300 MG PO CAPS: 300.0000 mg | ORAL_CAPSULE | Freq: Three times a day (TID) | ORAL | 0 refills | Status: DC

## 2016-11-12 NOTE — Progress Notes (Signed)
Name: Jessica Burgess   MRN: [id number]    DOB: [birth year]   Date:11/12/2016       Progress Note  Subjective  Chief Complaint  Chief Complaint  Patient presents with  . Thyroid Problem    Has elevated thyroid and wanted checked out. Excessive sweating, constipation    HPI  Ovarian cancer: history of hysterectomy for fibroid many years ago, developed acute left lower abdominal pain and saw her GI and CT showed abnormal omentum, suspicious for ovarian cancer. She has seen oncologist, and had biopsy that confirmed ovarian cancer on 10/2016. She is seeing Dr. Fransisca Connors at [facility], will have debulking surgery on 11/19/2016 and follow with chemo and radiation therapy.  Dyslipidemia: she is taking fish oil and has been eating healthier and  Triglycerides was greatly improved, also HDL is higher. We will recheck labs in March   Chronic back pain; previous history of back surgery, still has daily pain. Took Fentanyl patch years ago but caused worsening of migraines. She has been taking Percocet for many years. Explained risk of concomitant use of Ambien, BZD and Flexeril and she has been off BZD and Flexeril. We tried stopping Ambien and switched to Trazodone, but she was unable to sleep for days, she is willing to try Ambien CR. Discussed replacing Percocet with long acting narcotics but she is afraid of changing at this time. She has seen  Dr. Arnoldo Morale. She states she continues to have radiculitis behind both legs. Pain is on average 1-2 with medication, goes up to 5-6 when about due for next dose. She is going to renew pain contract today and also drug screen 10/2016  Insomnia: takes Zolpidem, 10 mg daily, unable to tolerated Trazodone. She is on Ambien CR and is doing okay.   Depression Major : she is getting worse, she lost a friend, taking mediation, recently diagnosed with ovarian cancer and carcinomatosis, she is feeling tired, having crying spells.   Metabolic Syndrome/Prediabetes: hgbA1C  was down, she is on Metformin and life style modification. She denies polyphagia, polyuria or polydipsia.   Cervical spine stenosis and radiculitis: s/p neck ( last surgery Dr. Arnoldo Morale March 2017 ) surgery was zero at times, but it is getting worse again, aching like pain. She is doing well now, no longer has tingling or numbness.   Migraine headache: she was doing well after neck surgery,  but is doing better, 6-8 episodes a month, taking Gabapentin, and seems to be helping.   Hypothyroidism: TSH elevated and started on Synthroid 50 mcg last week by oncologist, no symptoms of hyperthyroidism at this time, we will recheck level in 4-6 weeks  Patient Active Problem List   Diagnosis Date Noted  . 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 Peritoneal carcinomatosis ([place]) 10/28/2016  . Chronic right-sided low back pain with right-sided sciatica 02/02/2016  . History of neck surgery 08/01/2015  . Hematoma 06/14/2015  . Perennial allergic rhinitis 05/05/2015  . Generalized anxiety disorder 02/03/2015  . Back pain, chronic 08/25/2014  . Insomnia, persistent 08/25/2014  . Chronic cervical pain 08/25/2014  . Major depression, chronic ([place]) 08/25/2014  . Dyslipidemia 08/25/2014  . Gastro-esophageal reflux disease without esophagitis 08/25/2014  . H/O high risk medication treatment 08/25/2014  . Blood glucose elevated 08/25/2014  . Migraine without aura and without status migrainosus, not intractable 08/25/2014  . Climacteric 08/25/2014  . Dysmetabolic syndrome 95/62/1308  . Fungal infection of toenail 08/25/2014  . Obesity (BMI 30.0-34.9) 08/25/2014  . Vitamin D deficiency 08/25/2014  .  Engages in travel abroad 08/25/2014  . Cervical disc disorder with radiculopathy 04/28/2013    Past Surgical History:  Procedure Laterality Date  . ABDOMINAL HYSTERECTOMY    . ANTERIOR CERVICAL DECOMP/DISCECTOMY FUSION N/A 06/07/2015   Procedure: Cervical three - four and Cervical six- seven  anterior cervical decompression with fusion interbody prosthesis plating and bonegraft;  Surgeon: Newman Pies, MD;  Location: [place] [facility];  Service: Neurosurgery;  Laterality: N/A;  C34 and C67 anterior cervical decompression with fusion interbody prosthesis plating and bonegraft  . BACK SURGERY     x2 Lower   . EVACUATION OF CERVICAL HEMATOMA N/A 06/14/2015   Procedure: EVACUATION OF CERVICAL HEMATOMA;  Surgeon: Newman Pies, MD;  Location: [place] [facility];  Service: Neurosurgery;  Laterality: N/A;  . NECK SURGERY     x3  . TONSILLECTOMY      Family History  Problem Relation Age of Onset  . Depression Mother   . Migraines Mother   . Dementia Father   . Hyperlipidemia Brother   . Breast cancer Paternal Aunt        41s    Social History   Social History  . Marital status: Single    Spouse name: N/A  . Number of children: 0  . Years of education: 12+   Occupational History  .  Disabled  . Disabled     Social History Main Topics  . Smoking status: Former Smoker    Packs/day: 1.50    Years: 20.00    Types: Cigarettes    Start date: 03/19/1979    Quit date: 08/25/1999  . Smokeless tobacco: Never Used  . Alcohol use 0.0 oz/week     Comment: wine occassionally  . Drug use: No  . Sexual activity: No   Other Topics Concern  . Not on file   Social History Narrative   Patient is single.    Patient lives with roommates.    Patient on disability    Patient has no children.    Patient has some college      Current Outpatient Prescriptions:  .  albuterol (PROVENTIL HFA;VENTOLIN HFA) 108 (90 Base) MCG/ACT inhaler, Inhale 2 puffs into the lungs every 6 (six) hours as needed for wheezing or shortness of breath., Disp: 1 Inhaler, Rfl: 2 .  albuterol (PROVENTIL) (2.5 MG/3ML) 0.083% nebulizer solution, Take 3 mLs (2.5 mg total) by nebulization every 6 (six) hours as needed for wheezing or shortness of breath., Disp: 75 mL, Rfl: 2 .  Ascorbic Acid (VITAMIN C PO), Take by mouth.,  Disp: , Rfl:  .  atorvastatin (LIPITOR) 40 MG tablet, Take 1 tablet (40 mg total) by mouth daily., Disp: 90 tablet, Rfl: 4 .  Cholecalciferol (VITAMIN D-1000 MAX ST) 1000 UNITS tablet, Take 1 tablet by mouth 2 (two) times daily., Disp: , Rfl:  .  Cyanocobalamin (B-12 PO), Take 1 tablet by mouth daily. , Disp: , Rfl:  .  docusate sodium (COLACE) 100 MG capsule, Take 1 capsule (100 mg total) by mouth 2 (two) times daily., Disp: 60 capsule, Rfl: 0 .  DULoxetine (CYMBALTA) 60 MG capsule, take 1 capsule by mouth once daily, Disp: 90 capsule, Rfl: 1 .  enoxaparin (LOVENOX) 40 MG/0.4ML injection, Inject 40 mg into the skin daily., Disp: , Rfl:  .  estradiol (ESTRACE) 0.5 MG tablet, take 1 tablet by mouth once daily, Disp: 90 tablet, Rfl: 4 .  ferrous sulfate (IRON SUPPLEMENT) 325 (65 FE) MG tablet, Take 1 tablet by mouth daily.,  Disp: , Rfl:  .  fluticasone (FLONASE) 50 MCG/ACT nasal spray, Place 2 sprays into both nostrils as needed., Disp: 16 g, Rfl: 5 .  gabapentin (NEURONTIN) 300 MG capsule, Take 1 capsule (300 mg total) by mouth 3 (three) times daily. Titrate to up to 300 mg three times daily, Disp: 270 capsule, Rfl: 0 .  levocetirizine (XYZAL) 5 MG tablet, Take 1 tablet (5 mg total) by mouth every evening., Disp: 15 tablet, Rfl: 0 .  levothyroxine (SYNTHROID, LEVOTHROID) 50 MCG tablet, Take 1 tablet by mouth daily., Disp: , Rfl:  .  Magnesium Oxide 500 MG CAPS, Take 500 mg by mouth 2 (two) times daily. , Disp: , Rfl:  .  metFORMIN (GLUCOPHAGE) 500 MG tablet, Take 1 tablet (500 mg total) by mouth daily., Disp: 90 tablet, Rfl: 1 .  Multiple Vitamins-Minerals (MULTIVITAMIN PO), Take 1 tablet by mouth daily. , Disp: , Rfl:  .  Omega-3 Fatty Acids (FISH OIL PO), Take by mouth., Disp: , Rfl:  .  oxyCODONE-acetaminophen (PERCOCET) 10-325 MG tablet, Take 1 tablet by mouth every 4 (four) hours as needed for pain., Disp: 90 tablet, Rfl: 0 .  potassium gluconate 595 MG TABS tablet, Take 595 mg by mouth daily.,  Disp: , Rfl:  .  SUMAtriptan (IMITREX) 100 MG tablet, take 1 tablet by mouth if needed for migraines as directed, Disp: 27 tablet, Rfl: 1 .  zolpidem (AMBIEN CR) 6.25 MG CR tablet, take 1 tablet at bedtime, Disp: 90 tablet, Rfl: 0  No Known Allergies   ROS  Constitutional: Negative for fever or weight change.  Respiratory: Positive for cough and shortness of breath ( improved now - it was present after biopsy was done) )    Cardiovascular: Negative for chest pain or palpitations.  Gastrointestinal: Positive  for abdominal pain, no bowel changes.  Musculoskeletal: Negative for gait problem or joint swelling.  Skin: Negative for rash.  Neurological: Negative for dizziness or headache.  No other specific complaints in a complete review of systems (except as listed in HPI above).  Objective  Vitals:   11/12/16 1330  BP: 122/70  Pulse: 96  Resp: 16  Temp: 97.6 F (36.4 C)  TempSrc: Oral  SpO2: 97%  Weight: 181 lb 8 oz (82.3 kg)  Height: '5\' 3"'  (1.6 m)    Body mass index is 32.15 kg/m.  Physical Exam  Constitutional: Patient appears well-developed and well-nourished. Obese  No distress.  HEENT: head atraumatic, normocephalic, pupils equal and reactive to light,  neck supple, throat within normal limits Cardiovascular: Normal rate, regular rhythm and normal heart sounds.  No murmur heard. No BLE edema. Pulmonary/Chest: Effort normal and breath sounds normal. No respiratory distress. Abdominal: Soft.  There is no tenderness. Psychiatric: Patient has a normal mood and affect. behavior is normal. Judgment and thought content normal.  Recent Results (from the past 2160 hour(s))  HM DIABETES EYE EXAM     Status: None   Collection Time: 10/23/16 12:00 AM  Result Value Ref Range   HM Diabetic Eye Exam No Retinopathy No Retinopathy    Comment: [facility] Jom[facility] at [facility]  Creatinine, serum     Status: None   Collection Time: 10/24/16  9:29 AM  Result Value Ref Range    Creatinine, Ser 0.94 0.44 - 1.00 mg/dL   GFR calc non Af Amer >60 >60 mL/min   GFR calc Af Amer >60 >60 mL/min    Comment: (NOTE) The eGFR has been calculated using the  CKD EPI equation. This calculation has not been validated in all clinical situations. eGFR's persistently <60 mL/min signify possible Chronic Kidney Disease.   BUN     Status: None   Collection Time: 10/24/16  9:29 AM  Result Value Ref Range   BUN 20 6 - 20 mg/dL  Ovarian Malignancy Risk-ROMA     Status: Abnormal   Collection Time: 10/28/16  2:39 PM  Result Value Ref Range   Cancer Antigen (CA) 125 2,047.0 (H) 0.0 - 38.1 U/mL    Comment: Roche ECLIA methodology   HE4 807.3 (H) 0.0 - 96.5 pmol/L    Comment: Roche ECLIA methodology   Premenopausal ROMA 9.88 (H) See below   Postmenopausal ROMA 9.88 (H) See below   Comment Comment     Comment: The Risk for Ovarian Malignancy Algorithm (ROMA) test is intended to aid in assessing whether a premenopausal or postmenopausal woman who presents with an ovarian adnexal mass is at high or low likelihood of finding malignancy on surgery. ROMA is indicated for women who meet the following criteria: over age 53; ovarian adnexal mass present for which surgery is planned, and not yet referred to an oncologist. ROMA must be interpreted in conjunction with an independent clinical and radiological assessment. The test is not intended as a screening or stand-alone diagnostic assay. HE4 and CA125 values obtained with different assay methods or kits cannot be used interchangeably.   CEA     Status: None   Collection Time: 10/28/16  2:39 PM  Result Value Ref Range   CEA 2.4 0.0 - 4.7 ng/mL    Comment:        Roche ECLIA methodology       Nonsmokers  <3.9                                      Smokers     <5.6   Premenopausal Interp: HIGH     Status: None   Collection Time: 10/28/16  2:39 PM  Result Value Ref Range   Premenopausal Interp: HIGH Comment     Comment: If the patient is  premenopausal, then the premenopausal ROMA score of greater than or equal to 1.14 is consistent with a high likelihood of finding a malignancy on surgery.   Postmenopausal Interp: HIGH     Status: None   Collection Time: 10/28/16  2:39 PM  Result Value Ref Range   Postmenopausal Interp: HIGH Comment     Comment: If the patient is postmenopausal, then the postmenopausal ROMA score of greater than or equal to 2.99 is consistent with a high likelihood of finding a malignancy on surgery.   PapIG, HPV, rfx 16/18     Status: None   Collection Time: 10/28/16  3:26 PM  Result Value Ref Range   DIAGNOSIS: Comment     Comment: NEGATIVE FOR INTRAEPITHELIAL LESION AND MALIGNANCY. THIS SPECIMEN WAS RESCREENED AS PART OF OUR QUALITY CONTROL PROGRAM.    Specimen adequacy: Comment     Comment: Satisfactory for evaluation. Endocervical and/or squamous metaplastic cells (endocervical component) are present.    Clinician Provided ICD10 Comment     Comment: R93.5 Z12.4    Performed by: Comment     Comment: Crissie Figures, Cytotechnologist (ASCP)   QC reviewed by: Comment     Comment: Migdalia Dk, Supervisory Cytotechnologist (ASCP)   PAP Smear Comment .    Note: Comment  Comment: The Pap smear is a screening test designed to aid in the detection of premalignant and malignant conditions of the uterine cervix.  It is not a diagnostic procedure and should not be used as the sole means of detecting cervical cancer.  Both false-positive and false-negative reports do occur.    Test Methodology Comment     Comment: This liquid based ThinPrep(R) pap test was screened with the use of an image guided system.    HPV, high-risk Negative Negative    Comment: This high-risk HPV test detects thirteen high-risk types (16/18/31/33/35/39/45/51/52/56/58/59/68) without differentiation.      PHQ2/9: Depression screen [facility] 2/9 07/24/2016 05/06/2016 02/02/2016 11/01/2015 08/01/2015  Decreased Interest 0 0 1 0 0   Down, Depressed, Hopeless 0 0 1 0 0  PHQ - 2 Score 0 0 2 0 0  Altered sleeping - - 0 - -  Tired, decreased energy - - 1 - -  Change in appetite - - 0 - -  Feeling bad or failure about yourself  - - 0 - -  Trouble concentrating - - 0 - -  Moving slowly or fidgety/restless - - 0 - -  Suicidal thoughts - - 0 - -  PHQ-9 Score - - 3 - -  Difficult doing work/chores - - Not difficult at all - -     Fall Risk: Fall Risk  11/12/2016 07/24/2016 05/06/2016 02/02/2016 08/01/2015  Falls in the past year? No No No Yes No  Number falls in past yr: - - - 2 or more -  Comment - - - - -  Injury with Fall? - - - No -  Follow up - - - Falls evaluation completed -     Functional Status Survey: Is the patient deaf or have difficulty hearing?: No Does the patient have difficulty seeing, even when wearing glasses/contacts?: No Does the patient have difficulty concentrating, remembering, or making decisions?: No Does the patient have difficulty walking or climbing stairs?: No Does the patient have difficulty dressing or bathing?: No Does the patient have difficulty doing errands alone such as visiting a doctor's office or shopping?: No   Assessment & Plan  1. Carcinoma of ovary, unspecified laterality ([place])  She is going to [facility]  2. Chronic low back pain with bilateral sciatica, unspecified back pain laterality  Pain is stable, did not get phone call from pain clinic and we will hold off for now since recent ovarian cancer   3. Need for pneumococcal vaccination  -PCV 23  4. Needs flu shot  - flu shot  5. Carcinomatosis ([place])  Recent biopsy, and will have debulking and oophorectomy, salpingectomy done  6. Major depression, chronic  Upset about new diagnosis, she was about to go on trips and had to cancel   7. Insomnia, persistent  Taking Ambien - CR 6.25 mg and has difficulty falling asleep at times, worse since stress is higher.   8. Dyslipidemia  Recheck in March  9. Migraine  without aura and without status migrainosus, not intractable  - gabapentin (NEURONTIN) 300 MG capsule; Take 1 capsule (300 mg total) by mouth 3 (three) times daily. Titrate to up to 300 mg three times daily  Dispense: 270 capsule; Refill: 0  10. Chronic right-sided low back pain with right-sided sciatica  - oxyCODONE-acetaminophen (PERCOCET) 10-325 MG tablet; Take 1 tablet by mouth every 4 (four) hours as needed for pain.  Dispense: 90 tablet; Refill: 0 - gabapentin (NEURONTIN) 300 MG capsule; Take 1 capsule (300  mg total) by mouth 3 (three) times daily. Titrate to up to 300 mg three times daily  Dispense: 270 capsule; Refill: 0  11. Chronic cervical pain  - oxyCODONE-acetaminophen (PERCOCET) 10-325 MG tablet; Take 1 tablet by mouth every 4 (four) hours as needed for pain.  Dispense: 90 tablet; Refill: 0 - gabapentin (NEURONTIN) 300 MG capsule; Take 1 capsule (300 mg total) by mouth 3 (three) times daily. Titrate to up to 300 mg three times daily  Dispense: 270 capsule; Refill: 0  12. Dysmetabolic syndrome  - metFORMIN (GLUCOPHAGE) 500 MG tablet; Take 1 tablet (500 mg total) by mouth daily.  Dispense: 90 tablet; Refill: 1  13. Hypothyroidism (acquired)  - TSH  14. Nasal congestion  Continue Flonase  15. Perennial allergic rhinitis  - fluticasone (FLONASE) 50 MCG/ACT nasal spray; Place 2 sprays into both nostrils as needed.  Dispense: 16 g; Refill: 5

## 2016-11-15 DIAGNOSIS — R918 Other nonspecific abnormal finding of lung field: Secondary | ICD-10-CM | POA: Diagnosis not present

## 2016-11-15 DIAGNOSIS — C800 Disseminated malignant neoplasm, unspecified: Secondary | ICD-10-CM | POA: Diagnosis not present

## 2016-11-15 DIAGNOSIS — R935 Abnormal findings on diagnostic imaging of other abdominal regions, including retroperitoneum: Secondary | ICD-10-CM | POA: Diagnosis not present

## 2016-11-16 DIAGNOSIS — C801 Malignant (primary) neoplasm, unspecified: Secondary | ICD-10-CM

## 2016-11-16 HISTORY — DX: Malignant (primary) neoplasm, unspecified: C80.1

## 2016-11-18 ENCOUNTER — Other Ambulatory Visit: Payer: Self-pay | Admitting: Family Medicine

## 2016-11-18 DIAGNOSIS — M542 Cervicalgia: Principal | ICD-10-CM

## 2016-11-18 DIAGNOSIS — G8929 Other chronic pain: Secondary | ICD-10-CM

## 2016-11-19 DIAGNOSIS — C763 Malignant neoplasm of pelvis: Secondary | ICD-10-CM | POA: Diagnosis not present

## 2016-11-19 DIAGNOSIS — Z87891 Personal history of nicotine dependence: Secondary | ICD-10-CM | POA: Diagnosis not present

## 2016-11-19 DIAGNOSIS — C561 Malignant neoplasm of right ovary: Secondary | ICD-10-CM | POA: Diagnosis not present

## 2016-11-19 DIAGNOSIS — Z7984 Long term (current) use of oral hypoglycemic drugs: Secondary | ICD-10-CM | POA: Diagnosis not present

## 2016-11-19 DIAGNOSIS — J45909 Unspecified asthma, uncomplicated: Secondary | ICD-10-CM | POA: Diagnosis not present

## 2016-11-19 DIAGNOSIS — G8918 Other acute postprocedural pain: Secondary | ICD-10-CM | POA: Diagnosis not present

## 2016-11-19 DIAGNOSIS — Z90711 Acquired absence of uterus with remaining cervical stump: Secondary | ICD-10-CM | POA: Diagnosis not present

## 2016-11-19 DIAGNOSIS — E039 Hypothyroidism, unspecified: Secondary | ICD-10-CM | POA: Diagnosis not present

## 2016-11-19 DIAGNOSIS — F419 Anxiety disorder, unspecified: Secondary | ICD-10-CM | POA: Diagnosis not present

## 2016-11-19 DIAGNOSIS — R0902 Hypoxemia: Secondary | ICD-10-CM | POA: Diagnosis not present

## 2016-11-19 DIAGNOSIS — C569 Malignant neoplasm of unspecified ovary: Secondary | ICD-10-CM | POA: Diagnosis not present

## 2016-11-19 DIAGNOSIS — F329 Major depressive disorder, single episode, unspecified: Secondary | ICD-10-CM | POA: Diagnosis not present

## 2016-11-19 DIAGNOSIS — C5701 Malignant neoplasm of right fallopian tube: Secondary | ICD-10-CM | POA: Diagnosis not present

## 2016-11-19 DIAGNOSIS — C786 Secondary malignant neoplasm of retroperitoneum and peritoneum: Secondary | ICD-10-CM | POA: Diagnosis not present

## 2016-11-19 DIAGNOSIS — C562 Malignant neoplasm of left ovary: Secondary | ICD-10-CM | POA: Diagnosis not present

## 2016-11-19 DIAGNOSIS — R918 Other nonspecific abnormal finding of lung field: Secondary | ICD-10-CM | POA: Diagnosis not present

## 2016-11-19 DIAGNOSIS — Z6832 Body mass index (BMI) 32.0-32.9, adult: Secondary | ICD-10-CM | POA: Diagnosis not present

## 2016-11-19 DIAGNOSIS — C7982 Secondary malignant neoplasm of genital organs: Secondary | ICD-10-CM | POA: Diagnosis not present

## 2016-11-19 DIAGNOSIS — C785 Secondary malignant neoplasm of large intestine and rectum: Secondary | ICD-10-CM | POA: Diagnosis not present

## 2016-11-19 DIAGNOSIS — E669 Obesity, unspecified: Secondary | ICD-10-CM | POA: Diagnosis not present

## 2016-11-19 DIAGNOSIS — E785 Hyperlipidemia, unspecified: Secondary | ICD-10-CM | POA: Diagnosis not present

## 2016-11-19 NOTE — Telephone Encounter (Signed)
Patient requesting refill of Atorvastatin to [facility].

## 2016-11-20 DIAGNOSIS — D62 Acute posthemorrhagic anemia: Secondary | ICD-10-CM | POA: Insufficient documentation

## 2016-11-21 DIAGNOSIS — Z9889 Other specified postprocedural states: Secondary | ICD-10-CM

## 2016-11-21 DIAGNOSIS — R0902 Hypoxemia: Secondary | ICD-10-CM | POA: Insufficient documentation

## 2016-11-22 ENCOUNTER — Encounter: Payer: Self-pay | Admitting: Family Medicine

## 2016-11-28 DIAGNOSIS — C569 Malignant neoplasm of unspecified ovary: Secondary | ICD-10-CM | POA: Diagnosis not present

## 2016-11-28 DIAGNOSIS — Z713 Dietary counseling and surveillance: Secondary | ICD-10-CM | POA: Diagnosis not present

## 2016-11-28 DIAGNOSIS — F419 Anxiety disorder, unspecified: Secondary | ICD-10-CM | POA: Diagnosis not present

## 2016-11-28 DIAGNOSIS — Z6832 Body mass index (BMI) 32.0-32.9, adult: Secondary | ICD-10-CM | POA: Diagnosis not present

## 2016-11-28 DIAGNOSIS — Z9889 Other specified postprocedural states: Secondary | ICD-10-CM | POA: Diagnosis not present

## 2016-11-28 DIAGNOSIS — Z9049 Acquired absence of other specified parts of digestive tract: Secondary | ICD-10-CM | POA: Diagnosis not present

## 2016-11-28 DIAGNOSIS — Z9079 Acquired absence of other genital organ(s): Secondary | ICD-10-CM | POA: Diagnosis not present

## 2016-11-28 DIAGNOSIS — E785 Hyperlipidemia, unspecified: Secondary | ICD-10-CM | POA: Diagnosis not present

## 2016-11-28 DIAGNOSIS — C800 Disseminated malignant neoplasm, unspecified: Secondary | ICD-10-CM | POA: Diagnosis not present

## 2016-11-28 DIAGNOSIS — Z87891 Personal history of nicotine dependence: Secondary | ICD-10-CM | POA: Diagnosis not present

## 2016-11-28 DIAGNOSIS — G8918 Other acute postprocedural pain: Secondary | ICD-10-CM | POA: Diagnosis not present

## 2016-11-28 DIAGNOSIS — F329 Major depressive disorder, single episode, unspecified: Secondary | ICD-10-CM | POA: Diagnosis not present

## 2016-11-28 DIAGNOSIS — Z90722 Acquired absence of ovaries, bilateral: Secondary | ICD-10-CM | POA: Diagnosis not present

## 2016-11-30 ENCOUNTER — Encounter: Payer: Self-pay | Admitting: Emergency Medicine

## 2016-11-30 ENCOUNTER — Emergency Department
Admission: EM | Admit: 2016-11-30 | Discharge: 2016-12-01 | Payer: PPO | Attending: Student in an Organized Health Care Education/Training Program | Admitting: Student in an Organized Health Care Education/Training Program

## 2016-11-30 ENCOUNTER — Ambulatory Visit
Admission: EM | Admit: 2016-11-30 | Discharge: 2016-11-30 | Disposition: A | Discharge status: Home / Self Care | Payer: PPO | Attending: Family Medicine | Admitting: Family Medicine

## 2016-11-30 ENCOUNTER — Emergency Department: Payer: PPO

## 2016-11-30 DIAGNOSIS — C482 Malignant neoplasm of peritoneum, unspecified: Secondary | ICD-10-CM | POA: Diagnosis not present

## 2016-11-30 DIAGNOSIS — G8918 Other acute postprocedural pain: Secondary | ICD-10-CM

## 2016-11-30 DIAGNOSIS — K760 Fatty (change of) liver, not elsewhere classified: Secondary | ICD-10-CM | POA: Diagnosis not present

## 2016-11-30 DIAGNOSIS — S301XXA Contusion of abdominal wall, initial encounter: Secondary | ICD-10-CM

## 2016-11-30 DIAGNOSIS — K91873 Postprocedural seroma of a digestive system organ or structure following other procedure: Secondary | ICD-10-CM | POA: Diagnosis not present

## 2016-11-30 DIAGNOSIS — J45909 Unspecified asthma, uncomplicated: Secondary | ICD-10-CM | POA: Insufficient documentation

## 2016-11-30 DIAGNOSIS — L7634 Postprocedural seroma of skin and subcutaneous tissue following other procedure: Secondary | ICD-10-CM | POA: Diagnosis not present

## 2016-11-30 DIAGNOSIS — C569 Malignant neoplasm of unspecified ovary: Secondary | ICD-10-CM | POA: Insufficient documentation

## 2016-11-30 DIAGNOSIS — T8131XA Disruption of external operation (surgical) wound, not elsewhere classified, initial encounter: Secondary | ICD-10-CM

## 2016-11-30 DIAGNOSIS — Z85828 Personal history of other malignant neoplasm of skin: Secondary | ICD-10-CM | POA: Insufficient documentation

## 2016-11-30 DIAGNOSIS — Z4889 Encounter for other specified surgical aftercare: Secondary | ICD-10-CM

## 2016-11-30 DIAGNOSIS — Z79899 Other long term (current) drug therapy: Secondary | ICD-10-CM | POA: Diagnosis not present

## 2016-11-30 LAB — COMPREHENSIVE METABOLIC PANEL
ALBUMIN: 3.6 g/dL (ref 3.5–5.0)
ALT: 47 U/L (ref 14–54)
ANION GAP: 11 (ref 5–15)
AST: 23 U/L (ref 15–41)
Alkaline Phosphatase: 128 U/L — ABNORMAL HIGH (ref 38–126)
BILIRUBIN TOTAL: 0.2 mg/dL — AB (ref 0.3–1.2)
BUN: 16 mg/dL (ref 6–20)
CALCIUM: 9.1 mg/dL (ref 8.9–10.3)
CO2: 28 mmol/L (ref 22–32)
Chloride: 98 mmol/L — ABNORMAL LOW (ref 101–111)
Creatinine, Ser: 0.83 mg/dL (ref 0.44–1.00)
GFR calc Af Amer: >60 mL/min (ref >60)
GFR calc non Af Amer: >60 mL/min (ref >60)
GLUCOSE: 116 mg/dL — AB (ref 65–99)
Potassium: 4.1 mmol/L (ref 3.5–5.1)
Sodium: 137 mmol/L (ref 135–145)
TOTAL PROTEIN: 7.4 g/dL (ref 6.5–8.1)

## 2016-11-30 LAB — CBC WITH DIFFERENTIAL/PLATELET
BASOS PCT: 1 %
Basophils Absolute: 0.1 10*3/uL (ref 0–0.1)
EOS PCT: 15 %
Eosinophils Absolute: 1.4 10*3/uL — ABNORMAL HIGH (ref 0–0.7)
HEMATOCRIT: 34.9 % — AB (ref 35.0–47.0)
Hemoglobin: 12.2 g/dL (ref 12.0–16.0)
Lymphocytes Relative: 27 %
Lymphs Abs: 2.6 10*3/uL (ref 1.0–3.6)
MCH: 31.2 pg (ref 26.0–34.0)
MCHC: 35.1 g/dL (ref 32.0–36.0)
MCV: 89 fL (ref 80.0–100.0)
MONO ABS: 0.9 10*3/uL (ref 0.2–0.9)
MONOS PCT: 9 %
NEUTROS ABS: 4.7 10*3/uL (ref 1.4–6.5)
Neutrophils Relative %: 48 %
Platelets: 522 10*3/uL — ABNORMAL HIGH (ref 150–440)
RBC: 3.92 MIL/uL (ref 3.80–5.20)
RDW: 12 % (ref 11.5–14.5)
WBC: 9.5 10*3/uL (ref 3.6–11.0)

## 2016-11-30 LAB — LACTIC ACID, PLASMA: Lactic Acid, Venous: 1.4 mmol/L (ref 0.5–1.9)

## 2016-11-30 MED ORDER — FENTANYL CITRATE (PF) 100 MCG/2ML IJ SOLN
100.0000 ug | INTRAMUSCULAR | Status: DC | PRN
  Administered 2016-11-30 – 2016-12-01 (×2): 100 ug via INTRAVENOUS
  Filled 2016-11-30 (×2): qty 2

## 2016-11-30 MED ORDER — IOPAMIDOL (ISOVUE-300) INJECTION 61%
100.0000 mL | Freq: Once | INTRAVENOUS | Status: AC | PRN
  Administered 2016-11-30: 100 mL via INTRAVENOUS

## 2016-11-30 MED ORDER — SODIUM CHLORIDE 0.9 % IV BOLUS (SEPSIS)
500.0000 mL | Freq: Once | INTRAVENOUS | Status: AC
  Administered 2016-11-30: 500 mL via INTRAVENOUS

## 2016-11-30 NOTE — ED Triage Notes (Signed)
Patient had surgery 9/4 due to cancer, had surgery at [facility], states tumors were in ovaries, around colon and abdominal lining. Had incisional bleeding 4 days post op.  None until today when she had blood and clear drainage from incision. Incision now covered with dressing, no bleed through seen. Not removed in triage.

## 2016-11-30 NOTE — ED Provider Notes (Signed)
[facility] Emergency Department Provider Note    First MD Initiated Contact with Patient 11/30/16 2116     (approximate)  I have reviewed the triage vital signs and the nursing notes.   HISTORY  Chief Complaint Post-op Problem    HPI Jessica Burgess is a 61 y.o. female status post recent laparotomy for ovarian tumor and resection at [facility] on the fourth of this month presents with initially bloody and then serous drainage from the wound. Just recently had the staples removed. Denies any fevers but states that she is having night sweats. No nausea or vomiting. States that she is having abdominal pain but this is not worse than it has been over the past several days in the postop period.  If that she's had roughly a cup of fluid drained from the wound just below the umbilicus.    Past Medical History:  Diagnosis Date  . Allergic rhinitis, cause unspecified   . Anxiety state, unspecified   . Arthritis   . Asthma    only when sick   . Backache, unspecified   . Bronchitis    hx of when get sick  . Cancer ([place])    skin cancer , basal cell   . Cervicalgia   . Complication of anesthesia   . Dermatophytosis of nail   . Dysmetabolic syndrome X   . Encounter for long-term (current) use of other medications   . Esophageal reflux   . Insomnia, unspecified   . Leukocytosis, unspecified   . Migraine without aura, without mention of intractable migraine without mention of status migrainosus   . Other and unspecified hyperlipidemia   . Other malaise and fatigue   . Overweight(278.02)   . PONV (postoperative nausea and vomiting)   . Spinal stenosis in cervical region   . Symptomatic menopausal or female climacteric states   . Unspecified disorder of skin and subcutaneous tissue   . Unspecified vitamin D deficiency    Family History  Problem Relation Age of Onset  . Depression Mother   . Migraines Mother   . Dementia Father   . Hyperlipidemia Brother     . Breast cancer Paternal Aunt        84s   Past Surgical History:  Procedure Laterality Date  . ABDOMINAL HYSTERECTOMY    . ANTERIOR CERVICAL DECOMP/DISCECTOMY FUSION N/A 06/07/2015   Procedure: Cervical three - four and Cervical six- seven anterior cervical decompression with fusion interbody prosthesis plating and bonegraft;  Surgeon: Newman Pies, MD;  Location: [facility];  Service: Neurosurgery;  Laterality: N/A;  C34 and C67 anterior cervical decompression with fusion interbody prosthesis plating and bonegraft  . BACK SURGERY     x2 Lower   . EVACUATION OF CERVICAL HEMATOMA N/A 06/14/2015   Procedure: EVACUATION OF CERVICAL HEMATOMA;  Surgeon: Newman Pies, MD;  Location: [facility];  Service: Neurosurgery;  Laterality: N/A;  . NECK SURGERY     x3  . TONSILLECTOMY     Patient Active Problem List   Diagnosis Date Noted  . 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 Peritoneal carcinomatosis ([place]) 10/28/2016  . Chronic right-sided low back pain with right-sided sciatica 02/02/2016  . History of neck surgery 08/01/2015  . Hematoma 06/14/2015  . Perennial allergic rhinitis 05/05/2015  . Generalized anxiety disorder 02/03/2015  . Back pain, chronic 08/25/2014  . Insomnia, persistent 08/25/2014  . Chronic cervical pain 08/25/2014  . Major depression, chronic ([place]) 08/25/2014  . Dyslipidemia  08/25/2014  . Gastro-esophageal reflux disease without esophagitis 08/25/2014  . H/O high risk medication treatment 08/25/2014  . Blood glucose elevated 08/25/2014  . Migraine without aura and without status migrainosus, not intractable 08/25/2014  . Climacteric 08/25/2014  . Dysmetabolic syndrome 23/55/7322  . Fungal infection of toenail 08/25/2014  . Obesity (BMI 30.0-34.9) 08/25/2014  . Vitamin D deficiency 08/25/2014  . Engages in travel abroad 08/25/2014  . Cervical disc disorder with radiculopathy 04/28/2013      Prior to Admission medications   Medication  Sig Start Date End Date Taking? Authorizing Provider  albuterol (PROVENTIL HFA;VENTOLIN HFA) 108 (90 Base) MCG/ACT inhaler Inhale 2 puffs into the lungs every 6 (six) hours as needed for wheezing or shortness of breath. 10/04/16   Hubbard Hartshorn, FNP  albuterol (PROVENTIL) (2.5 MG/3ML) 0.083% nebulizer solution Take 3 mLs (2.5 mg total) by nebulization every 6 (six) hours as needed for wheezing or shortness of breath. 05/05/15   Steele Sizer, MD  Ascorbic Acid (VITAMIN C PO) Take by mouth.    [provider]  atorvastatin (LIPITOR) 40 MG tablet take 1 tablet by mouth once daily 11/19/16   Steele Sizer, MD  Cholecalciferol (VITAMIN D-1000 MAX ST) 1000 UNITS tablet Take 1 tablet by mouth 2 (two) times daily.    [provider]  Cyanocobalamin (B-12 PO) Take 1 tablet by mouth daily.     [provider]  docusate sodium (COLACE) 100 MG capsule Take 1 capsule (100 mg total) by mouth 2 (two) times daily. 06/08/15   Newman Pies, MD  DULoxetine (CYMBALTA) 60 MG capsule take 1 capsule by mouth once daily 10/01/16   Steele Sizer, MD  enoxaparin (LOVENOX) 40 MG/0.4ML injection Inject 40 mg into the skin daily. 11/05/16 12/03/16  [provider]  estradiol (ESTRACE) 0.5 MG tablet take 1 tablet by mouth once daily 09/27/16   Steele Sizer, MD  ferrous sulfate (IRON SUPPLEMENT) 325 (65 FE) MG tablet Take 1 tablet by mouth daily.    [provider]  fluticasone (FLONASE) 50 MCG/ACT nasal spray Place 2 sprays into both nostrils as needed. 11/12/16   Steele Sizer, MD  gabapentin (NEURONTIN) 300 MG capsule Take 1 capsule (300 mg total) by mouth 3 (three) times daily. Titrate to up to 300 mg three times daily 11/12/16   Steele Sizer, MD  levocetirizine (XYZAL) 5 MG tablet Take 1 tablet (5 mg total) by mouth every evening. 10/04/16   Hubbard Hartshorn, FNP  levothyroxine (SYNTHROID, LEVOTHROID) 50 MCG tablet Take 1 tablet by mouth daily. 11/08/16 11/08/17  [provider]  Magnesium Oxide 500 MG CAPS Take 500 mg by mouth 2 (two) times daily.     [provider]  metFORMIN (GLUCOPHAGE) 500 MG tablet Take 1 tablet (500 mg total) by mouth daily. 11/12/16   Steele Sizer, MD  Multiple Vitamins-Minerals (MULTIVITAMIN PO) Take 1 tablet by mouth daily.     [provider]  Omega-3 Fatty Acids (FISH OIL PO) Take by mouth.    [provider]  oxyCODONE-acetaminophen (PERCOCET) 10-325 MG tablet Take 1 tablet by mouth every 8 (eight) hours as needed for pain. 11/12/16   Steele Sizer, MD  potassium gluconate 595 MG TABS tablet Take 595 mg by mouth daily.    [provider]  SUMAtriptan (IMITREX) 100 MG tablet take 1 tablet by mouth if needed for migraines as directed 10/24/16   Steele Sizer, MD  zolpidem (AMBIEN CR) 6.25 MG CR tablet take 1 tablet at  bedtime 10/28/16   Steele Sizer, MD    Allergies Patient has no known allergies.    Social History Social History  Substance Use Topics  . Smoking status: Former Smoker    Packs/day: 1.50    Years: 20.00    Types: Cigarettes    Start date: 03/19/1979    Quit date: 08/25/1999  . Smokeless tobacco: Never Used  . Alcohol use 0.0 oz/week     Comment: wine occassionally    Review of Systems Patient denies headaches, rhinorrhea, blurry vision, numbness, shortness of breath, chest pain, edema, cough, abdominal pain, nausea, vomiting, diarrhea, dysuria, fevers, rashes or hallucinations unless otherwise stated above in HPI. ____________________________________________   PHYSICAL EXAM:  VITAL SIGNS: Vitals:   11/30/16 1642 11/30/16 2046  BP: (!) 160/85 133/75  Pulse: 90 88  Resp: 20 18  Temp: 98.8 F (37.1 C) 98.4 F (36.9 C)  SpO2: 99% 96%    Constitutional: Alert and oriented. Well appearing and in no acute distress. Eyes: Conjunctivae are normal.  Head: Atraumatic. Nose: No congestion/rhinnorhea. Mouth/Throat: Mucous membranes are moist.   Neck: No  stridor. Painless ROM.  Cardiovascular: Normal rate, regular rhythm. Grossly normal heart sounds.  Good peripheral circulation. Respiratory: Normal respiratory effort.  No retractions. Lungs CTAB. Gastrointestinal:soft but diffusely tender, no peritoneal signs, scant serous drainage just inferior to the umbilicus. No bleeding. Wound appears well approximated. There is some erythema but no crepitus or blistering.  Musculoskeletal: No lower extremity tenderness nor edema.  No joint effusions. Neurologic:  Normal speech and language. No gross focal neurologic deficits are appreciated. No facial droop Skin:  Skin is warm, dry and intact. No rash noted. Psychiatric: Mood and affect are normal. Speech and behavior are normal.  ____________________________________________   LABS (all labs ordered are listed, but only abnormal results are displayed)  Results for orders placed or performed during the hospital encounter of 11/30/16 (from the past 24 hour(s))  CBC with Differential     Status: Abnormal   Collection Time: 11/30/16  4:53 PM  Result Value Ref Range   WBC 9.5 3.6 - 11.0 K/uL   RBC 3.92 3.80 - 5.20 MIL/uL   Hemoglobin 12.2 12.0 - 16.0 g/dL   HCT 34.9 (L) 35.0 - 47.0 %   MCV 89.0 80.0 - 100.0 fL   MCH 31.2 26.0 - 34.0 pg   MCHC 35.1 32.0 - 36.0 g/dL   RDW 12.0 11.5 - 14.5 %   Platelets 522 (H) 150 - 440 K/uL   Neutrophils Relative % 48 %   Neutro Abs 4.7 1.4 - 6.5 K/uL   Lymphocytes Relative 27 %   Lymphs Abs 2.6 1.0 - 3.6 K/uL   Monocytes Relative 9 %   Monocytes Absolute 0.9 0.2 - 0.9 K/uL   Eosinophils Relative 15 %   Eosinophils Absolute 1.4 (H) 0 - 0.7 K/uL   Basophils Relative 1 %   Basophils Absolute 0.1 0 - 0.1 K/uL  Comprehensive metabolic panel     Status: Abnormal   Collection Time: 11/30/16  4:53 PM  Result Value Ref Range   Sodium 137 135 - 145 mmol/L   Potassium 4.1 3.5 - 5.1 mmol/L   Chloride 98 (L) 101 - 111 mmol/L   CO2 28 22 - 32 mmol/L   Glucose, Bld 116  (H) 65 - 99 mg/dL   BUN 16 6 - 20 mg/dL   Creatinine, Ser 0.83 0.44 - 1.00 mg/dL   Calcium 9.1 8.9 - 10.3 mg/dL   Total  Protein 7.4 6.5 - 8.1 g/dL   Albumin 3.6 3.5 - 5.0 g/dL   AST 23 15 - 41 U/L   ALT 47 14 - 54 U/L   Alkaline Phosphatase 128 (H) 38 - 126 U/L   Total Bilirubin 0.2 (L) 0.3 - 1.2 mg/dL   GFR calc non Af Amer >60 >60 mL/min   GFR calc Af Amer >60 >60 mL/min   Anion gap 11 5 - 15  Lactic acid, plasma     Status: None   Collection Time: 11/30/16  4:54 PM  Result Value Ref Range   Lactic Acid, Venous 1.4 0.5 - 1.9 mmol/L   ____________________________________________ ____________________________________________  RADIOLOGY I personally reviewed all radiographic images ordered to evaluate for the above acute complaints and reviewed radiology reports and findings.  These findings were personally discussed with the patient.  Please see medical record for radiology report.  ____________________________________________   PROCEDURES  Procedure(s) performed:  Procedures    Critical Care performed: no ____________________________________________   INITIAL IMPRESSION / ASSESSMENT AND PLAN / ED COURSE  Pertinent labs & imaging results that were available during my care of the patient were reviewed by me and considered in my medical decision making (see chart for details).  DDX: seroma, abscess, cellulitis,  Fistula, dehiscence  Jessica Burgess is a 61 y.o. who presents to the ED with Initially bleeding but then serous drainage from her laparotomy incision area just afebrile hemodynamic stable. Does have a small amount of persistent serous drainage. K is reassuring. Patient given IV pain medications for her discomfort. She is less consistent with abscess. CT imaging ordered to evaluate for the above complaints shows no evidence of fistula. Does have fairly sizable fluid collection on the abdominal wall that seems to track for the majority of the incision. We'll touch  base with her surgeons at [facility].  Clinical Course as of Dec 01 4  Sun Dec 01, 2016  0001 Discussed the extent fluid collection along patient's incision with Dr. Manus Rudd of general surgery.  Based on size, patient's pain and inability to get patient and do close clinic follow-up as an outpatient we discussed and decided to be in the patient's best interest to be evaluated by her surgeon at [facility] for evaluation. She remains hemodynamically stable. Is requiring a couple doses of IV meds for pain control. This does not seem clinically consistent with abscess. Will hold off on antibiotics at this time.  [PR]    Clinical Course User Index [PR] Merlyn Lot, MD     ____________________________________________   FINAL CLINICAL IMPRESSION(S) / ED DIAGNOSES  Final diagnoses:  Post-operative pain  Abdominal wall seroma, initial encounter ([place])      NEW MEDICATIONS STARTED DURING THIS VISIT:  New Prescriptions   No medications on file     Note:  This document was prepared using Dragon voice recognition software and may include unintentional dictation errors.    Merlyn Lot, MD 12/01/16 [phone]

## 2016-11-30 NOTE — ED Provider Notes (Signed)
[facility] ____________________________________________  Time seen: Approximately 3:10 PM  I have reviewed the triage vital signs and the nursing notes.   HISTORY  Chief Complaint Wound Check  HPI Jessica Burgess is a 61 y.o. female presenting for evaluation of postsurgical wound. Patient states that on 11/19/2016 she had left ovarian adenocarcinoma removed as well as right ovary, large mass and some cancer areas around her: Per patient. Patient states today after she had the shower and bent forward somewhat, she had a large amount of bleeding from the postsurgical wound. Patient states some blood in the shower and then watery blood on the floor, states it was not a small amount. Patient also reports that she had one similar incidents 2 days after surgery, but no other bleeding or drainage from the wound. States that she had the staples removed 2 days ago outpatient and has Steri-Strips in place. States some redness around the skin, but denies any purulent drainage. States that she has had continued abdominal pain and slightly increased pain around the site of bleeding. Denies accompanying dysuria, diarrhea or constipation, fevers, nausea or vomiting. States overall she has been doing well postsurgery.  Denies chest pain, shortness of breath,dysuria, or rash.  Denies recent antibiotic use. Patient states that her surgery was performed at [facility], but scheduled to have continual care with Dr. Grayland Ormond at [place]. Patient states she is scheduled to have port placed this coming week to start chemotherapy.  Steele Sizer, MD: PCP Oncology: Grayland Ormond   Past Medical History:  Diagnosis Date  . Allergic rhinitis, cause unspecified   . Anxiety state, unspecified   . Arthritis   . Asthma    only when sick   . Backache, unspecified   . Bronchitis    hx of when get sick  . Cancer ([place])    skin cancer , basal cell   . Cervicalgia   . Complication of anesthesia   .  Dermatophytosis of nail   . Dysmetabolic syndrome X   . Encounter for long-term (current) use of other medications   . Esophageal reflux   . Insomnia, unspecified   . Leukocytosis, unspecified   . Migraine without aura, without mention of intractable migraine without mention of status migrainosus   . Other and unspecified hyperlipidemia   . Other malaise and fatigue   . Overweight(278.02)   . PONV (postoperative nausea and vomiting)   . Spinal stenosis in cervical region   . Symptomatic menopausal or female climacteric states   . Unspecified disorder of skin and subcutaneous tissue   . Unspecified vitamin D deficiency     Patient Active Problem List   Diagnosis Date Noted  . 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 Peritoneal carcinomatosis ([place]) 10/28/2016  . Chronic right-sided low back pain with right-sided sciatica 02/02/2016  . History of neck surgery 08/01/2015  . Hematoma 06/14/2015  . Perennial allergic rhinitis 05/05/2015  . Generalized anxiety disorder 02/03/2015  . Back pain, chronic 08/25/2014  . Insomnia, persistent 08/25/2014  . Chronic cervical pain 08/25/2014  . Major depression, chronic ([place]) 08/25/2014  . Dyslipidemia 08/25/2014  . Gastro-esophageal reflux disease without esophagitis 08/25/2014  . H/O high risk medication treatment 08/25/2014  . Blood glucose elevated 08/25/2014  . Migraine without aura and without status migrainosus, not intractable 08/25/2014  . Climacteric 08/25/2014  . Dysmetabolic syndrome 68/02/7516  . Fungal infection of toenail 08/25/2014  . Obesity (BMI 30.0-34.9) 08/25/2014  . Vitamin D deficiency 08/25/2014  . Engages in travel  abroad 08/25/2014  . Cervical disc disorder with radiculopathy 04/28/2013    Past Surgical History:  Procedure Laterality Date  . ABDOMINAL HYSTERECTOMY    . ANTERIOR CERVICAL DECOMP/DISCECTOMY FUSION N/A 06/07/2015   Procedure: Cervical three - four and Cervical six- seven anterior  cervical decompression with fusion interbody prosthesis plating and bonegraft;  Surgeon: Newman Pies, MD;  Location: [place] [facility];  Service: Neurosurgery;  Laterality: N/A;  C34 and C67 anterior cervical decompression with fusion interbody prosthesis plating and bonegraft  . BACK SURGERY     x2 Lower   . EVACUATION OF CERVICAL HEMATOMA N/A 06/14/2015   Procedure: EVACUATION OF CERVICAL HEMATOMA;  Surgeon: Newman Pies, MD;  Location: [place] [facility];  Service: Neurosurgery;  Laterality: N/A;  . NECK SURGERY     x3  . TONSILLECTOMY       No current facility-administered medications for this encounter.   Current Outpatient Prescriptions:  .  albuterol (PROVENTIL HFA;VENTOLIN HFA) 108 (90 Base) MCG/ACT inhaler, Inhale 2 puffs into the lungs every 6 (six) hours as needed for wheezing or shortness of breath., Disp: 1 Inhaler, Rfl: 2 .  albuterol (PROVENTIL) (2.5 MG/3ML) 0.083% nebulizer solution, Take 3 mLs (2.5 mg total) by nebulization every 6 (six) hours as needed for wheezing or shortness of breath., Disp: 75 mL, Rfl: 2 .  Ascorbic Acid (VITAMIN C PO), Take by mouth., Disp: , Rfl:  .  atorvastatin (LIPITOR) 40 MG tablet, take 1 tablet by mouth once daily, Disp: 90 tablet, Rfl: 4 .  Cholecalciferol (VITAMIN D-1000 MAX ST) 1000 UNITS tablet, Take 1 tablet by mouth 2 (two) times daily., Disp: , Rfl:  .  Cyanocobalamin (B-12 PO), Take 1 tablet by mouth daily. , Disp: , Rfl:  .  docusate sodium (COLACE) 100 MG capsule, Take 1 capsule (100 mg total) by mouth 2 (two) times daily., Disp: 60 capsule, Rfl: 0 .  DULoxetine (CYMBALTA) 60 MG capsule, take 1 capsule by mouth once daily, Disp: 90 capsule, Rfl: 1 .  enoxaparin (LOVENOX) 40 MG/0.4ML injection, Inject 40 mg into the skin daily., Disp: , Rfl:  .  estradiol (ESTRACE) 0.5 MG tablet, take 1 tablet by mouth once daily, Disp: 90 tablet, Rfl: 4 .  ferrous sulfate (IRON SUPPLEMENT) 325 (65 FE) MG tablet, Take 1 tablet by mouth daily., Disp: , Rfl:    .  fluticasone (FLONASE) 50 MCG/ACT nasal spray, Place 2 sprays into both nostrils as needed., Disp: 16 g, Rfl: 5 .  gabapentin (NEURONTIN) 300 MG capsule, Take 1 capsule (300 mg total) by mouth 3 (three) times daily. Titrate to up to 300 mg three times daily, Disp: 270 capsule, Rfl: 0 .  levocetirizine (XYZAL) 5 MG tablet, Take 1 tablet (5 mg total) by mouth every evening., Disp: 15 tablet, Rfl: 0 .  levothyroxine (SYNTHROID, LEVOTHROID) 50 MCG tablet, Take 1 tablet by mouth daily., Disp: , Rfl:  .  Magnesium Oxide 500 MG CAPS, Take 500 mg by mouth 2 (two) times daily. , Disp: , Rfl:  .  metFORMIN (GLUCOPHAGE) 500 MG tablet, Take 1 tablet (500 mg total) by mouth daily., Disp: 90 tablet, Rfl: 1 .  Multiple Vitamins-Minerals (MULTIVITAMIN PO), Take 1 tablet by mouth daily. , Disp: , Rfl:  .  Omega-3 Fatty Acids (FISH OIL PO), Take by mouth., Disp: , Rfl:  .  oxyCODONE-acetaminophen (PERCOCET) 10-325 MG tablet, Take 1 tablet by mouth every 8 (eight) hours as needed for pain., Disp: 90 tablet, Rfl: 0 .  potassium gluconate  595 MG TABS tablet, Take 595 mg by mouth daily., Disp: , Rfl:  .  SUMAtriptan (IMITREX) 100 MG tablet, take 1 tablet by mouth if needed for migraines as directed, Disp: 27 tablet, Rfl: 1 .  zolpidem (AMBIEN CR) 6.25 MG CR tablet, take 1 tablet at bedtime, Disp: 90 tablet, Rfl: 0  Allergies Patient has no known allergies.  Family History  Problem Relation Age of Onset  . Depression Mother   . Migraines Mother   . Dementia Father   . Hyperlipidemia Brother   . Breast cancer Paternal Aunt        96s    Social History Social History  Substance Use Topics  . Smoking status: Former Smoker    Packs/day: 1.50    Years: 20.00    Types: Cigarettes    Start date: 03/19/1979    Quit date: 08/25/1999  . Smokeless tobacco: Never Used  . Alcohol use 0.0 oz/week     Comment: wine occassionally    Review of Systems Constitutional: No fever/chills Eyes: No visual changes. ENT:  No sore throat. Cardiovascular: Denies chest pain. Respiratory: Denies shortness of breath. Gastrointestinal: No abdominal pain.  No nausea, no vomiting.  No diarrhea.  No constipation. LBM yesterday. Denies blood in stool or urine. Genitourinary: Negative for dysuria. Musculoskeletal: Negative for back pain. Skin: As above.   ____________________________________________   PHYSICAL EXAM:  VITAL SIGNS: ED Triage Vitals  Enc Vitals Group     BP 11/30/16 1420 (!) 158/80     Pulse Rate 11/30/16 1420 83     Resp 11/30/16 1420 16     Temp 11/30/16 1420 98.5 F (36.9 C)     Temp Source 11/30/16 1420 Oral     SpO2 11/30/16 1420 100 %     Weight 11/30/16 1419 181 lb (82.1 kg)     Height 11/30/16 1419 5\' 3"  (1.6 m)     Head Circumference --      Peak Flow --      Pain Score 11/30/16 1419 6     Pain Loc --      Pain Edu? --      Excl. in [place]? --     Constitutional: Alert and oriented. Well appearing and in no acute distress. Cardiovascular: Normal rate, regular rhythm. Grossly normal heart sounds.  Good peripheral circulation. Respiratory: Normal respiratory effort without tachypnea nor retractions. Breath sounds are clear and equal bilaterally. No wheezes, rales, rhonchi. Gastrointestinal: . Normal Bowel sounds. Slightly distended abdomen. Diffuse abdominal tenderness, increase along surgical incision, slightly increased surrounding drainage site at left lateral umbilicus. Musculoskeletal: Steady gait. Neurologic:  Normal speech and language.Speech is normal. No gait instability.  Skin:  Skin is warm, dry. Except: Large surgical scar extended in length of abdomen left lateral umbilicus, Steri-Strips present, lateral to the umbilicus mild erythema present, tenderness present, able to express mild amount serosanguineous drainage, no other drainage noted. Psychiatric: Mood and affect are normal. Speech and behavior are normal. Patient exhibits appropriate insight and judgment     ___________________________________________   LABS (all labs ordered are listed, but only abnormal results are displayed)  Labs Reviewed - No data to display  RADIOLOGY  No results found. ____________________________________________   PROCEDURES Procedures   Area cleaned, 4 Steri-Strips applied to reinforce surgical incision directly lateral to the umbilicus.  INITIAL IMPRESSION / ASSESSMENT AND PLAN / ED COURSE  Pertinent labs & imaging results that were available during my care of the patient were reviewed by me  and considered in my medical decision making (see chart for details).   Very well-appearing patient. Recent large abdominal surgical site. No surgical site gaping wound noted. Able to express serous blood adjacent to the umbilicus with slight erythema surrounding. No definitive purulent drainage. Diffuse abdominal tenderness slightly increased surrounding drainage site. Discussed in detail with patient though may be only superficial wound complication, concern for infection versus deeper surgical complication. Discussed with patient and recommend imaging including possible ultrasound versus CT of abdomen. Patient states that she'll go to  regional to have this performed. Maudie Mercury RN triage nurse called and given report. Wound reinforced with Steri-Strips and dry dressing. Patient states sister will take her to [place]. Patient stable at time of discharge.    ____________________________________________   FINAL CLINICAL IMPRESSION(S) / ED DIAGNOSES  Final diagnoses:  Encounter for post surgical wound check     Discharge Medication List as of 11/30/2016  3:30 PM      Note: This dictation was prepared with Dragon dictation along with smaller phrase technology. Any transcriptional errors that result from this process are unintentional.         Marylene Land, NP 11/30/16 1738

## 2016-11-30 NOTE — ED Triage Notes (Signed)
Patient states that she had surgery at [facility] on 11/19/2016 on her abdomen. Patient states that she has an area that has opened back up and she is concerned. Patient denies fevers or chills. Patient states that area is very painful. Patient states that she has noticed blood drainage from this area.

## 2016-11-30 NOTE — Discharge Instructions (Signed)
Go directly to Emergency room as discussed.

## 2016-12-01 DIAGNOSIS — R740 Nonspecific elevation of levels of transaminase and lactic acid dehydrogenase [LDH]: Secondary | ICD-10-CM

## 2016-12-01 DIAGNOSIS — K760 Fatty (change of) liver, not elsewhere classified: Secondary | ICD-10-CM | POA: Diagnosis not present

## 2016-12-01 DIAGNOSIS — T814XXA Infection following a procedure, initial encounter: Secondary | ICD-10-CM | POA: Diagnosis not present

## 2016-12-01 DIAGNOSIS — R7401 Elevation of levels of liver transaminase levels: Secondary | ICD-10-CM | POA: Insufficient documentation

## 2016-12-01 DIAGNOSIS — R188 Other ascites: Secondary | ICD-10-CM | POA: Diagnosis not present

## 2016-12-01 DIAGNOSIS — Z5181 Encounter for therapeutic drug level monitoring: Secondary | ICD-10-CM | POA: Diagnosis not present

## 2016-12-01 DIAGNOSIS — L539 Erythematous condition, unspecified: Secondary | ICD-10-CM | POA: Diagnosis not present

## 2016-12-01 DIAGNOSIS — T8189XA Other complications of procedures, not elsewhere classified, initial encounter: Secondary | ICD-10-CM | POA: Diagnosis not present

## 2016-12-01 DIAGNOSIS — Z9889 Other specified postprocedural states: Secondary | ICD-10-CM | POA: Diagnosis not present

## 2016-12-01 DIAGNOSIS — Z90722 Acquired absence of ovaries, bilateral: Secondary | ICD-10-CM | POA: Diagnosis not present

## 2016-12-01 DIAGNOSIS — Z79899 Other long term (current) drug therapy: Secondary | ICD-10-CM | POA: Diagnosis not present

## 2016-12-01 DIAGNOSIS — R10815 Periumbilic abdominal tenderness: Secondary | ICD-10-CM | POA: Diagnosis not present

## 2016-12-01 DIAGNOSIS — G43909 Migraine, unspecified, not intractable, without status migrainosus: Secondary | ICD-10-CM | POA: Insufficient documentation

## 2016-12-01 DIAGNOSIS — C569 Malignant neoplasm of unspecified ovary: Secondary | ICD-10-CM | POA: Diagnosis not present

## 2016-12-01 DIAGNOSIS — Z87891 Personal history of nicotine dependence: Secondary | ICD-10-CM | POA: Diagnosis not present

## 2016-12-01 DIAGNOSIS — Z90710 Acquired absence of both cervix and uterus: Secondary | ICD-10-CM | POA: Diagnosis not present

## 2016-12-01 DIAGNOSIS — G8918 Other acute postprocedural pain: Secondary | ICD-10-CM | POA: Diagnosis not present

## 2016-12-01 DIAGNOSIS — L7634 Postprocedural seroma of skin and subcutaneous tissue following other procedure: Secondary | ICD-10-CM | POA: Diagnosis not present

## 2016-12-01 DIAGNOSIS — R1032 Left lower quadrant pain: Secondary | ICD-10-CM | POA: Diagnosis not present

## 2016-12-01 DIAGNOSIS — R110 Nausea: Secondary | ICD-10-CM | POA: Diagnosis not present

## 2016-12-01 DIAGNOSIS — Z8543 Personal history of malignant neoplasm of ovary: Secondary | ICD-10-CM | POA: Diagnosis not present

## 2016-12-01 MED ORDER — MORPHINE SULFATE (PF) 4 MG/ML IV SOLN
4.0000 mg | INTRAVENOUS | Status: DC | PRN
  Administered 2016-12-01: 4 mg via INTRAVENOUS
  Filled 2016-12-01: qty 1

## 2016-12-01 MED ORDER — ONDANSETRON HCL 4 MG/2ML IJ SOLN
4.0000 mg | Freq: Once | INTRAMUSCULAR | Status: AC
  Administered 2016-12-01: 4 mg via INTRAVENOUS
  Filled 2016-12-01: qty 2

## 2016-12-01 NOTE — ED Notes (Signed)
[facility] transport called and were given report.  [facility] stated they are about 1 hour out.

## 2016-12-01 NOTE — ED Notes (Signed)
Called and gave report to charge nurse.

## 2016-12-01 NOTE — ED Notes (Signed)
Called talked with charge nurse said she was involved in a trauma, wanted to be called back in 10 minutes.

## 2016-12-02 ENCOUNTER — Other Ambulatory Visit: Payer: Self-pay | Admitting: Oncology

## 2016-12-02 DIAGNOSIS — C7960 Secondary malignant neoplasm of unspecified ovary: Secondary | ICD-10-CM

## 2016-12-02 MED ORDER — ONDANSETRON HCL 8 MG PO TABS: 8.0000 mg | ORAL_TABLET | Freq: Two times a day (BID) | ORAL | 2 refills | Status: DC | PRN

## 2016-12-02 MED ORDER — PROCHLORPERAZINE MALEATE 10 MG PO TABS: 10.0000 mg | ORAL_TABLET | Freq: Four times a day (QID) | ORAL | 2 refills | Status: DC | PRN

## 2016-12-02 MED ORDER — LIDOCAINE-PRILOCAINE 2.5-2.5 % EX CREA: TOPICAL_CREAM | CUTANEOUS | 3 refills | Status: DC

## 2016-12-02 NOTE — Progress Notes (Signed)
START OFF PATHWAY REGIMEN - Ovarian   [id number]:Carboplatin AUC=6 + Paclitaxel 175 mg/m2 q21 Days:   A cycle is every 21 days:     Paclitaxel      Carboplatin   **Always confirm dose/schedule in your pharmacy ordering system**    Patient Characteristics: Newly Diagnosed, Adjuvant Therapy, Stage IIB and Stage IIIA/B/C Optimal Cytoreduction AJCC T Category: TX AJCC N Category: NX AJCC M Category: M0 Therapeutic Status: Newly Diagnosed, Adjuvant Therapy AJCC 8 Stage Grouping: IIIB Intent of Therapy: Curative Intent, Discussed with Patient

## 2016-12-03 ENCOUNTER — Other Ambulatory Visit: Payer: Self-pay

## 2016-12-03 ENCOUNTER — Telehealth: Payer: Self-pay

## 2016-12-03 NOTE — Telephone Encounter (Signed)
Patient states she is very traumatized by the gaping hole in her stomach. She is having to have a good friend of hers pack her stomach wound twice daily until she sees the surgeon on Thursday. Is there anything you can prescribed for her to numb the area while packing it? Patient is going to call [facility] to see if there is anything they can do to help her. I went over the CT results with her as Dr. Ancil Boozer discussed on her chart and routed the call to Dr. Ancil Boozer.

## 2016-12-03 NOTE — Patient Outreach (Signed)
[facility]) Care Management  12/03/2016  TU SHIMMEL [birth year] [id number]     Transition of Care Referral  Referral Date: 12/03/16 Referral Source: [facility] Discharge Report Date of Admission: unknown Diagnosis: disseminated malignant neoplasm Date of Discharge: 11/23/16 Facility: [place]: [facility]   Referral received. No outreach warranted at this time. TOC will be completed by primary care provider office who will refer to [facility] care mgmt if needed.     Plan: RN CM will notify [facility] administrative assistant of case status.   Enzo Montgomery, RN,BSN,CCM [facility] Telephonic Care Management Coordinator Direct Phone: [phone] Toll Free: [phone] Fax: [phone]

## 2016-12-05 DIAGNOSIS — Z90722 Acquired absence of ovaries, bilateral: Secondary | ICD-10-CM | POA: Diagnosis not present

## 2016-12-05 DIAGNOSIS — R946 Abnormal results of thyroid function studies: Secondary | ICD-10-CM | POA: Diagnosis not present

## 2016-12-05 DIAGNOSIS — Z5111 Encounter for antineoplastic chemotherapy: Secondary | ICD-10-CM | POA: Diagnosis not present

## 2016-12-05 DIAGNOSIS — Z9079 Acquired absence of other genital organ(s): Secondary | ICD-10-CM | POA: Diagnosis not present

## 2016-12-05 DIAGNOSIS — G8918 Other acute postprocedural pain: Secondary | ICD-10-CM | POA: Diagnosis not present

## 2016-12-05 DIAGNOSIS — E785 Hyperlipidemia, unspecified: Secondary | ICD-10-CM | POA: Diagnosis not present

## 2016-12-05 DIAGNOSIS — F329 Major depressive disorder, single episode, unspecified: Secondary | ICD-10-CM | POA: Diagnosis not present

## 2016-12-05 DIAGNOSIS — C569 Malignant neoplasm of unspecified ovary: Secondary | ICD-10-CM | POA: Diagnosis not present

## 2016-12-05 DIAGNOSIS — Z87891 Personal history of nicotine dependence: Secondary | ICD-10-CM | POA: Diagnosis not present

## 2016-12-05 DIAGNOSIS — F419 Anxiety disorder, unspecified: Secondary | ICD-10-CM | POA: Diagnosis not present

## 2016-12-05 DIAGNOSIS — Z9049 Acquired absence of other specified parts of digestive tract: Secondary | ICD-10-CM | POA: Diagnosis not present

## 2016-12-05 DIAGNOSIS — T8131XS Disruption of external operation (surgical) wound, not elsewhere classified, sequela: Secondary | ICD-10-CM | POA: Diagnosis not present

## 2016-12-05 DIAGNOSIS — Z8049 Family history of malignant neoplasm of other genital organs: Secondary | ICD-10-CM | POA: Diagnosis not present

## 2016-12-05 DIAGNOSIS — Z803 Family history of malignant neoplasm of breast: Secondary | ICD-10-CM | POA: Diagnosis not present

## 2016-12-06 DIAGNOSIS — T8131XA Disruption of external operation (surgical) wound, not elsewhere classified, initial encounter: Secondary | ICD-10-CM | POA: Insufficient documentation

## 2016-12-08 NOTE — Progress Notes (Signed)
[place]  [phone] [phone]  ID: Jessica Burgess OB: Oct 10, 1955  MR#: [id number]  QIO#:[id number]  Patient Care Team: Steele Sizer, MD as PCP - General (Family Medicine) Clent Jacks, RN as Registered Nurse  CHIEF COMPLAINT: Stage IIIc high-grade serous ovarian carcinoma  INTERVAL HISTORY: Patient returns to clinic today for further evaluation and consideration of cycle 1 of 6 of carboplatinum and Taxol. She recently underwent debulking surgery at [facility] on November 19, 2016 and was also initiated on a clinical trial and has received preoperative infusion of Keytruda on November 11, 2016. She continues to have problems with wound healing, but otherwise feels well. She has increased weakness and fatigue. She has no neurologic complaints. She denies any recent fevers. She has a fair appetite, but denies weight loss. She admits to occasional nausea, but denies any vomiting, constipation, or diarrhea. She has no chest pain or shortness of breath. She has no urinary complaints. Patient otherwise feels well and offers no further specific complaints.  REVIEW OF SYSTEMS:   Review of Systems  Constitutional: Negative.  Negative for fever, malaise/fatigue and weight loss.  Respiratory: Negative.  Negative for cough and shortness of breath.   Cardiovascular: Negative.  Negative for chest pain and leg swelling.  Gastrointestinal: Positive for abdominal pain and nausea. Negative for blood in stool, constipation, diarrhea, melena and vomiting.  Genitourinary: Negative.   Musculoskeletal: Negative.   Skin: Negative.  Negative for rash.  Neurological: Negative.  Negative for sensory change and weakness.  Psychiatric/Behavioral: The patient is nervous/anxious.     As per HPI. Otherwise, a complete review of systems is negative.  PAST MEDICAL HISTORY: Past Medical History:  Diagnosis Date  . Allergic rhinitis, cause unspecified   .  Anxiety state, unspecified   . Arthritis   . Asthma    only when sick   . Backache, unspecified   . Bronchitis    hx of when get sick  . Cancer ([place])    skin cancer , basal cell   . Cervicalgia   . Complication of anesthesia   . Dermatophytosis of nail   . Dysmetabolic syndrome X   . Encounter for long-term (current) use of other medications   . Esophageal reflux   . Insomnia, unspecified   . Leukocytosis, unspecified   . Migraine without aura, without mention of intractable migraine without mention of status migrainosus   . Other and unspecified hyperlipidemia   . Other malaise and fatigue   . Overweight(278.02)   . PONV (postoperative nausea and vomiting)   . Spinal stenosis in cervical region   . Symptomatic menopausal or female climacteric states   . Unspecified disorder of skin and subcutaneous tissue   . Unspecified vitamin D deficiency     PAST SURGICAL HISTORY: Past Surgical History:  Procedure Laterality Date  . ABDOMINAL HYSTERECTOMY    . ANTERIOR CERVICAL DECOMP/DISCECTOMY FUSION N/A 06/07/2015   Procedure: Cervical three - four and Cervical six- seven anterior cervical decompression with fusion interbody prosthesis plating and bonegraft;  Surgeon: Newman Pies, MD;  Location: [place] [facility];  Service: Neurosurgery;  Laterality: N/A;  C34 and C67 anterior cervical decompression with fusion interbody prosthesis plating and bonegraft  . BACK SURGERY     x2 Lower   . EVACUATION OF CERVICAL HEMATOMA N/A 06/14/2015   Procedure: EVACUATION OF CERVICAL HEMATOMA;  Surgeon: Newman Pies, MD;  Location: [place] [facility];  Service: Neurosurgery;  Laterality: N/A;  . NECK SURGERY  x3  . TONSILLECTOMY      FAMILY HISTORY: Family History  Problem Relation Age of Onset  . Depression Mother   . Migraines Mother   . Dementia Father   . Hyperlipidemia Brother   . Breast cancer Paternal Aunt        109s    ADVANCED DIRECTIVES (Y/N):  N  HEALTH MAINTENANCE: Social  History  Substance Use Topics  . Smoking status: Former Smoker    Packs/day: 1.50    Years: 20.00    Types: Cigarettes    Start date: 03/19/1979    Quit date: 08/25/1999  . Smokeless tobacco: Never Used  . Alcohol use 0.0 oz/week     Comment: wine occassionally     Colonoscopy:  PAP:  Bone density:  Lipid panel:  No Known Allergies  Current Outpatient Prescriptions  Medication Sig Dispense Refill  . albuterol (PROVENTIL HFA;VENTOLIN HFA) 108 (90 Base) MCG/ACT inhaler Inhale 2 puffs into the lungs every 6 (six) hours as needed for wheezing or shortness of breath. 1 Inhaler 2  . albuterol (PROVENTIL) (2.5 MG/3ML) 0.083% nebulizer solution Take 3 mLs (2.5 mg total) by nebulization every 6 (six) hours as needed for wheezing or shortness of breath. 75 mL 2  . Ascorbic Acid (VITAMIN C PO) Take by mouth.    Marland Kitchen atorvastatin (LIPITOR) 40 MG tablet take 1 tablet by mouth once daily 90 tablet 4  . Cholecalciferol (VITAMIN D-1000 MAX ST) 1000 UNITS tablet Take 1 tablet by mouth 2 (two) times daily.    . Cyanocobalamin (B-12 PO) Take 1 tablet by mouth daily.     Marland Kitchen docusate sodium (COLACE) 100 MG capsule Take 1 capsule (100 mg total) by mouth 2 (two) times daily. 60 capsule 0  . DULoxetine (CYMBALTA) 60 MG capsule take 1 capsule by mouth once daily 90 capsule 1  . ferrous sulfate (IRON SUPPLEMENT) 325 (65 FE) MG tablet Take 1 tablet by mouth daily.    . fluticasone (FLONASE) 50 MCG/ACT nasal spray Place 2 sprays into both nostrils as needed. 16 g 5  . gabapentin (NEURONTIN) 300 MG capsule Take 1 capsule (300 mg total) by mouth 3 (three) times daily. Titrate to up to 300 mg three times daily 270 capsule 0  . levocetirizine (XYZAL) 5 MG tablet Take 1 tablet (5 mg total) by mouth every evening. 15 tablet 0  . levothyroxine (SYNTHROID, LEVOTHROID) 50 MCG tablet Take 1 tablet by mouth daily.    Marland Kitchen lidocaine-prilocaine (EMLA) cream Apply to affected area once 30 g 3  . Magnesium Oxide 500 MG CAPS Take 500  mg by mouth 2 (two) times daily.     . metFORMIN (GLUCOPHAGE) 500 MG tablet Take 1 tablet (500 mg total) by mouth daily. 90 tablet 1  . Multiple Vitamins-Minerals (MULTIVITAMIN PO) Take 1 tablet by mouth daily.     . Omega-3 Fatty Acids (FISH OIL PO) Take by mouth.    . ondansetron (ZOFRAN) 8 MG tablet Take 1 tablet (8 mg total) by mouth 2 (two) times daily as needed for refractory nausea / vomiting. 60 tablet 2  . oxyCODONE-acetaminophen (PERCOCET) 10-325 MG tablet Take 1 tablet by mouth every 8 (eight) hours as needed for pain. 90 tablet 0  . potassium gluconate 595 MG TABS tablet Take 595 mg by mouth daily.    . prochlorperazine (COMPAZINE) 10 MG tablet Take 1 tablet (10 mg total) by mouth every 6 (six) hours as needed (Nausea or vomiting). 60 tablet 2  . zolpidem (  AMBIEN CR) 6.25 MG CR tablet take 1 tablet at bedtime 90 tablet 0  . enoxaparin (LOVENOX) 40 MG/0.4ML injection Inject 40 mg into the skin daily.    Marland Kitchen estradiol (ESTRACE) 0.5 MG tablet take 1 tablet by mouth once daily 90 tablet 4  . SUMAtriptan (IMITREX) 100 MG tablet take 1 tablet by mouth if needed for migraines as directed 27 tablet 1   No current facility-administered medications for this visit.     OBJECTIVE: Vitals:   12/09/16 0918  BP: 129/77  Pulse: 83  Resp: 18  Temp: (!) 97.3 F (36.3 C)     Body mass index is 32.19 kg/m.    ECOG FS:1 - Symptomatic but completely ambulatory  General: Well-developed, well-nourished, no acute distress. Eyes: Pink conjunctiva, anicteric sclera. HEENT: Normocephalic, moist mucous membranes, clear oropharnyx. Lungs: Clear to auscultation bilaterally. Heart: Regular rate and rhythm. No rubs, murmurs, or gallops. Abdomen: Surgical wound healing by secondary intention with good granulation tissue, approximately 5-6 cm. Musculoskeletal: No edema, cyanosis, or clubbing. Neuro: Alert, answering all questions appropriately. Cranial nerves grossly intact. Skin: No rashes or petechiae  noted. Psych: Normal affect. Lymphatics: No cervical, calvicular, axillary or inguinal LAD.   LAB RESULTS:  Lab Results  Component Value Date   NA 136 12/09/2016   K 3.9 12/09/2016   CL 98 (L) 12/09/2016   CO2 28 12/09/2016   GLUCOSE 131 (H) 12/09/2016   BUN 14 12/09/2016   CREATININE 1.00 12/09/2016   CALCIUM 8.8 (L) 12/09/2016   PROT 7.1 12/09/2016   ALBUMIN 3.6 12/09/2016   AST 23 12/09/2016   ALT 41 12/09/2016   ALKPHOS 198 (H) 12/09/2016   BILITOT 0.4 12/09/2016   GFRNONAA 60 (L) 12/09/2016   GFRAA >60 12/09/2016    Lab Results  Component Value Date   WBC 10.6 12/09/2016   NEUTROABS 6.6 (H) 12/09/2016   HGB 12.1 12/09/2016   HCT 34.3 (L) 12/09/2016   MCV 87.3 12/09/2016   PLT 524 (H) 12/09/2016     STUDIES: Ct Abdomen Pelvis W Contrast  Result Date: 11/30/2016 CLINICAL DATA:  Incisional bleeding after surgery on 11/19/2016 for ovarian tumors and omental caking. EXAM: CT ABDOMEN AND PELVIS WITH CONTRAST TECHNIQUE: Multidetector CT imaging of the abdomen and pelvis was performed using the standard protocol following bolus administration of intravenous contrast. CONTRAST:  135mL ISOVUE-300 IOPAMIDOL (ISOVUE-300) INJECTION 61% COMPARISON:  None. FINDINGS: Lower chest: Clear lungs. Top-normal sized heart without pericardial effusion. Hepatobiliary: Status post cholecystectomy. Homogeneous enhancement of the liver with mild hepatic steatosis. No focal hepatic lesions. No biliary dilatation. Pancreas: No ductal dilatation or enhancing mass. Spleen: Normal Adrenals/Urinary Tract: Adrenal glands are unremarkable. Kidneys are normal, without renal calculi, focal lesion, or hydronephrosis. Bladder is unremarkable. Stomach/Bowel: The stomach is physiologically distended with fluid. There is normal small bowel rotation without mechanical obstruction. A significant amount of retained fecal matter is noted throughout large bowel to the level of the distal sigmoid colon and rectum. The  appendix is not confidently identified. Vascular/Lymphatic: Aortic atherosclerosis without aneurysm. No enlarged abdominal or pelvic lymph nodes. Reproductive: Status post hysterectomy.  No adnexal masses. Other: There has been debulking of previously noted omental caking. A moderate amount of low-density fluid is seen in the pelvis with smooth thin enhancement suggested, series 2 image 77 measuring 8.7 x 6.1 x 4 cm that is more likely related to peritoneal irrigation from recent surgery and not necessarily peritonitis. This warrants short-term interval follow-up and clinical correlation. Left parasagittal low-density fluid  collections are seen extending to the skin surface via the patient's incision with trace amount of fluid seen interposed between the rectus muscle. This likely represents a postop seroma, measuring at its largest 5.4 x 2 x 1.9 cm. No air is identified within. No active hemorrhage is seen within. Musculoskeletal: Suspicious osseous lesions. Multilevel degenerative change within the lumbar spine with spinal fusion hardware again noted at L4-5 (previously labeled as L5-S1) but based on prior MRI labeling from 10/25/2015 is at L4-5. IMPRESSION: 1. Postop debulking of omental caking since prior comparison CT with moderate amount of fluid in the pelvis. Thin smooth enhancement surrounding the fluid is suggested which may represent benign postop fluid status post pelvic irrigation. Early changes of a peritonitis are not entirely excluded but believed less likely. Short-term interval follow-up is therefore recommended as clinically necessary. 2. Low-density subcutaneous fluid collections along the left parasagittal incision terminating at the level of the rectus muscles. Findings likely represent postoperative seromas. No gas is seen within the subcutaneous fluid collections to indicate a potential abscess or fistulous connection. 3. Hepatic steatosis. Electronically Signed   By: Ashley Royalty M.D.   On:  11/30/2016 23:56    ASSESSMENT: Stage IIIc high-grade serous ovarian carcinoma  PLAN:  1. Stage IIIc high-grade serous ovarian carcinoma:  She recently underwent debulking surgery at [facility] on November 19, 2016 and was also initiated on a clinical trial and has received preoperative infusion of Keytruda on November 11, 2016. Plan was to initiate cycle 1 of 6 of adjuvant carboplatin and Taxol, but given her difficulties with wound healing, will delay treatment at least one week. Patient's CA 125 has decreased significantly postoperatively and is now 316.1. Return to clinic in 1 week for reconsideration of cycle 1. 2. Wound healing: Patient noted to have wound dehiscence, but now has approximately 5-6 cm open wound healing by secondary intention. Area appears healthy, noninfected with good granulation tissue. Delay treatment 1 week as above.  Approximately 30 minutes was spent in discussion of which greater than 50% was consultation.  Patient expressed understanding and was in agreement with this plan. She also understands that She can call clinic at any time with any questions, concerns, or complaints.   Cancer Staging No matching staging information was found for the patient.  Lloyd Huger, MD   12/13/2016 8:58 AM

## 2016-12-09 ENCOUNTER — Inpatient Hospital Stay (HOSPITAL_BASED_OUTPATIENT_CLINIC_OR_DEPARTMENT_OTHER): Payer: PPO | Admitting: Oncology

## 2016-12-09 ENCOUNTER — Encounter: Payer: Self-pay | Admitting: *Deleted

## 2016-12-09 ENCOUNTER — Inpatient Hospital Stay: Payer: PPO

## 2016-12-09 ENCOUNTER — Inpatient Hospital Stay: Payer: PPO | Attending: Oncology

## 2016-12-09 VITALS — BP 129/77 | HR 83 | Temp 97.3°F | Resp 18 | Wt 176.0 lb

## 2016-12-09 DIAGNOSIS — C569 Malignant neoplasm of unspecified ovary: Secondary | ICD-10-CM | POA: Insufficient documentation

## 2016-12-09 DIAGNOSIS — Z981 Arthrodesis status: Secondary | ICD-10-CM | POA: Diagnosis not present

## 2016-12-09 DIAGNOSIS — D72829 Elevated white blood cell count, unspecified: Secondary | ICD-10-CM | POA: Insufficient documentation

## 2016-12-09 DIAGNOSIS — K219 Gastro-esophageal reflux disease without esophagitis: Secondary | ICD-10-CM | POA: Diagnosis not present

## 2016-12-09 DIAGNOSIS — J45909 Unspecified asthma, uncomplicated: Secondary | ICD-10-CM | POA: Insufficient documentation

## 2016-12-09 DIAGNOSIS — C7960 Secondary malignant neoplasm of unspecified ovary: Secondary | ICD-10-CM

## 2016-12-09 DIAGNOSIS — Z9049 Acquired absence of other specified parts of digestive tract: Secondary | ICD-10-CM | POA: Insufficient documentation

## 2016-12-09 DIAGNOSIS — C801 Malignant (primary) neoplasm, unspecified: Principal | ICD-10-CM

## 2016-12-09 DIAGNOSIS — I700 Atherosclerosis of aorta: Secondary | ICD-10-CM | POA: Diagnosis not present

## 2016-12-09 DIAGNOSIS — K760 Fatty (change of) liver, not elsewhere classified: Secondary | ICD-10-CM | POA: Insufficient documentation

## 2016-12-09 DIAGNOSIS — E785 Hyperlipidemia, unspecified: Secondary | ICD-10-CM | POA: Insufficient documentation

## 2016-12-09 DIAGNOSIS — Z95828 Presence of other vascular implants and grafts: Secondary | ICD-10-CM

## 2016-12-09 DIAGNOSIS — Z87891 Personal history of nicotine dependence: Secondary | ICD-10-CM | POA: Insufficient documentation

## 2016-12-09 DIAGNOSIS — C786 Secondary malignant neoplasm of retroperitoneum and peritoneum: Secondary | ICD-10-CM

## 2016-12-09 DIAGNOSIS — Z79899 Other long term (current) drug therapy: Secondary | ICD-10-CM | POA: Diagnosis not present

## 2016-12-09 DIAGNOSIS — E559 Vitamin D deficiency, unspecified: Secondary | ICD-10-CM | POA: Diagnosis not present

## 2016-12-09 LAB — COMPREHENSIVE METABOLIC PANEL
ALK PHOS: 198 U/L — AB (ref 38–126)
ALT: 41 U/L (ref 14–54)
ANION GAP: 10 (ref 5–15)
AST: 23 U/L (ref 15–41)
Albumin: 3.6 g/dL (ref 3.5–5.0)
BUN: 14 mg/dL (ref 6–20)
CALCIUM: 8.8 mg/dL — AB (ref 8.9–10.3)
CO2: 28 mmol/L (ref 22–32)
Chloride: 98 mmol/L — ABNORMAL LOW (ref 101–111)
Creatinine, Ser: 1 mg/dL (ref 0.44–1.00)
GFR calc non Af Amer: 60 mL/min — ABNORMAL LOW (ref >60)
Glucose, Bld: 131 mg/dL — ABNORMAL HIGH (ref 65–99)
POTASSIUM: 3.9 mmol/L (ref 3.5–5.1)
SODIUM: 136 mmol/L (ref 135–145)
TOTAL PROTEIN: 7.1 g/dL (ref 6.5–8.1)
Total Bilirubin: 0.4 mg/dL (ref 0.3–1.2)

## 2016-12-09 LAB — CBC WITH DIFFERENTIAL/PLATELET
BASOS PCT: 1 %
Basophils Absolute: 0.1 10*3/uL (ref 0–0.1)
EOS ABS: 0.7 10*3/uL (ref 0–0.7)
EOS PCT: 7 %
HCT: 34.3 % — ABNORMAL LOW (ref 35.0–47.0)
HEMOGLOBIN: 12.1 g/dL (ref 12.0–16.0)
Lymphocytes Relative: 23 %
Lymphs Abs: 2.4 10*3/uL (ref 1.0–3.6)
MCH: 30.7 pg (ref 26.0–34.0)
MCHC: 35.1 g/dL (ref 32.0–36.0)
MCV: 87.3 fL (ref 80.0–100.0)
MONO ABS: 0.8 10*3/uL (ref 0.2–0.9)
MONOS PCT: 8 %
NEUTROS PCT: 61 %
Neutro Abs: 6.6 10*3/uL — ABNORMAL HIGH (ref 1.4–6.5)
Platelets: 524 10*3/uL — ABNORMAL HIGH (ref 150–440)
RBC: 3.93 MIL/uL (ref 3.80–5.20)
RDW: 11.9 % (ref 11.5–14.5)
WBC: 10.6 10*3/uL (ref 3.6–11.0)

## 2016-12-09 MED ORDER — HEPARIN SOD (PORK) LOCK FLUSH 100 UNIT/ML IV SOLN
500.0000 [IU] | Freq: Once | INTRAVENOUS | Status: AC
  Administered 2016-12-09: 500 [IU] via INTRAVENOUS

## 2016-12-09 MED ORDER — HEPARIN SOD (PORK) LOCK FLUSH 100 UNIT/ML IV SOLN
INTRAVENOUS | Status: AC
  Filled 2016-12-09: qty 5

## 2016-12-09 NOTE — Patient Instructions (Signed)
Per Dr. Grayland Ormond patient will not be receiving chemotherapy treatment today, right chest port flushes  deaccessed per protocol , gauze dressing applied.

## 2016-12-10 ENCOUNTER — Inpatient Hospital Stay: Payer: PPO

## 2016-12-10 LAB — CA 125: Cancer Antigen (CA) 125: 316.1 U/mL — ABNORMAL HIGH (ref 0.0–38.1)

## 2016-12-10 NOTE — Patient Instructions (Signed)

## 2016-12-12 ENCOUNTER — Other Ambulatory Visit: Payer: Self-pay | Admitting: Family Medicine

## 2016-12-12 DIAGNOSIS — G43009 Migraine without aura, not intractable, without status migrainosus: Secondary | ICD-10-CM

## 2016-12-12 NOTE — Telephone Encounter (Signed)
Patient requesting refill of Imitrex to [facility].

## 2016-12-15 ENCOUNTER — Other Ambulatory Visit: Payer: Self-pay | Admitting: Oncology

## 2016-12-15 NOTE — Progress Notes (Signed)
[facility]  [phone] [phone]  ID: Jessica Burgess OB: Apr 27, 1955  MR#: [id number]  ZDG#:[id number]  Patient Care Team: Steele Sizer, MD as PCP - General (Family Medicine) Clent Jacks, RN as Registered Nurse  CHIEF COMPLAINT: Stage IIIc high-grade serous ovarian carcinoma  INTERVAL HISTORY: Patient returns to clinic today for further evaluation and reconsideration of cycle 1 of 6 of carboplatinum and Taxol. The operative wound in her abdomen has significantly improved over the last week. Patient continues to be highly anxious, but otherwise feels well. She has no neurologic complaints. She denies any recent fevers. She has a fair appetite, but denies weight loss. She admits to occasional nausea, but denies any vomiting, constipation, or diarrhea. She has no chest pain or shortness of breath. She has no urinary complaints. Patient offers no further specific complaints today.  REVIEW OF SYSTEMS:   Review of Systems  Constitutional: Negative.  Negative for fever, malaise/fatigue and weight loss.  Respiratory: Negative.  Negative for cough and shortness of breath.   Cardiovascular: Negative.  Negative for chest pain and leg swelling.  Gastrointestinal: Positive for abdominal pain and nausea. Negative for blood in stool, constipation, diarrhea, melena and vomiting.  Genitourinary: Negative.   Musculoskeletal: Negative.   Skin: Negative.  Negative for rash.  Neurological: Negative.  Negative for sensory change and weakness.  Psychiatric/Behavioral: The patient is nervous/anxious.     As per HPI. Otherwise, a complete review of systems is negative.  PAST MEDICAL HISTORY: Past Medical History:  Diagnosis Date  . Allergic rhinitis, cause unspecified   . Anxiety state, unspecified   . Arthritis   . Asthma    only when sick   . Backache, unspecified   . Bronchitis    hx of when get sick  . Cancer ([place])    skin cancer , basal cell     . Cervicalgia   . Complication of anesthesia   . Dermatophytosis of nail   . Dysmetabolic syndrome X   . Encounter for long-term (current) use of other medications   . Esophageal reflux   . Insomnia, unspecified   . Leukocytosis, unspecified   . Migraine without aura, without mention of intractable migraine without mention of status migrainosus   . Other and unspecified hyperlipidemia   . Other malaise and fatigue   . Overweight(278.02)   . PONV (postoperative nausea and vomiting)   . Spinal stenosis in cervical region   . Symptomatic menopausal or female climacteric states   . Unspecified disorder of skin and subcutaneous tissue   . Unspecified vitamin D deficiency     PAST SURGICAL HISTORY: Past Surgical History:  Procedure Laterality Date  . ABDOMINAL HYSTERECTOMY    . ANTERIOR CERVICAL DECOMP/DISCECTOMY FUSION N/A 06/07/2015   Procedure: Cervical three - four and Cervical six- seven anterior cervical decompression with fusion interbody prosthesis plating and bonegraft;  Surgeon: Newman Pies, MD;  Location: [facility];  Service: Neurosurgery;  Laterality: N/A;  C34 and C67 anterior cervical decompression with fusion interbody prosthesis plating and bonegraft  . BACK SURGERY     x2 Lower   . EVACUATION OF CERVICAL HEMATOMA N/A 06/14/2015   Procedure: EVACUATION OF CERVICAL HEMATOMA;  Surgeon: Newman Pies, MD;  Location: [facility];  Service: Neurosurgery;  Laterality: N/A;  . NECK SURGERY     x3  . TONSILLECTOMY      FAMILY HISTORY: Family History  Problem Relation Age of Onset  . Depression Mother   .  Migraines Mother   . Dementia Father   . Hyperlipidemia Brother   . Breast cancer Paternal Aunt        80s    ADVANCED DIRECTIVES (Y/N):  N  HEALTH MAINTENANCE: Social History  Substance Use Topics  . Smoking status: Former Smoker    Packs/day: 1.50    Years: 20.00    Types: Cigarettes    Start date: 03/19/1979    Quit date: 08/25/1999  . Smokeless  tobacco: Never Used  . Alcohol use 0.0 oz/week     Comment: wine occassionally     Colonoscopy:  PAP:  Bone density:  Lipid panel:  No Known Allergies  Current Outpatient Prescriptions  Medication Sig Dispense Refill  . atorvastatin (LIPITOR) 40 MG tablet take 1 tablet by mouth once daily 90 tablet 4  . docusate sodium (COLACE) 100 MG capsule Take 1 capsule (100 mg total) by mouth 2 (two) times daily. 60 capsule 0  . DULoxetine (CYMBALTA) 60 MG capsule take 1 capsule by mouth once daily 90 capsule 1  . estradiol (ESTRACE) 0.5 MG tablet take 1 tablet by mouth once daily 90 tablet 4  . fluticasone (FLONASE) 50 MCG/ACT nasal spray Place 2 sprays into both nostrils as needed. 16 g 5  . gabapentin (NEURONTIN) 300 MG capsule Take 1 capsule (300 mg total) by mouth 3 (three) times daily. Titrate to up to 300 mg three times daily 270 capsule 0  . levothyroxine (SYNTHROID, LEVOTHROID) 50 MCG tablet Take 1 tablet by mouth daily.    Marland Kitchen lidocaine-prilocaine (EMLA) cream Apply to affected area once 30 g 3  . metFORMIN (GLUCOPHAGE) 500 MG tablet Take 1 tablet (500 mg total) by mouth daily. 90 tablet 1  . Omega-3 Fatty Acids (FISH OIL PO) Take by mouth.    . oxyCODONE-acetaminophen (PERCOCET) 10-325 MG tablet Take 1 tablet by mouth every 8 (eight) hours as needed for pain. 90 tablet 0  . SUMAtriptan (IMITREX) 100 MG tablet take 1 tablet by mouth if needed for migraines as directed 27 tablet 1  . zolpidem (AMBIEN CR) 6.25 MG CR tablet take 1 tablet at bedtime 90 tablet 0  . albuterol (PROVENTIL HFA;VENTOLIN HFA) 108 (90 Base) MCG/ACT inhaler Inhale 2 puffs into the lungs every 6 (six) hours as needed for wheezing or shortness of breath. (Patient not taking: Reported on 12/16/2016) 1 Inhaler 2  . albuterol (PROVENTIL) (2.5 MG/3ML) 0.083% nebulizer solution Take 3 mLs (2.5 mg total) by nebulization every 6 (six) hours as needed for wheezing or shortness of breath. (Patient not taking: Reported on 12/16/2016)  75 mL 2  . ALPRAZolam (XANAX) 0.25 MG tablet Take 1 tablet (0.25 mg total) by mouth 2 (two) times daily as needed for anxiety. 60 tablet 0  . Ascorbic Acid (VITAMIN C PO) Take by mouth.    . Cholecalciferol (VITAMIN D-1000 MAX ST) 1000 UNITS tablet Take 1 tablet by mouth 2 (two) times daily.    . Cyanocobalamin (B-12 PO) Take 1 tablet by mouth daily.     Marland Kitchen enoxaparin (LOVENOX) 40 MG/0.4ML injection Inject 40 mg into the skin daily.    . ferrous sulfate (IRON SUPPLEMENT) 325 (65 FE) MG tablet Take 1 tablet by mouth daily.    . Magnesium Oxide 500 MG CAPS Take 500 mg by mouth 2 (two) times daily.     . Multiple Vitamins-Minerals (MULTIVITAMIN PO) Take 1 tablet by mouth daily.     . ondansetron (ZOFRAN) 8 MG tablet Take 1 tablet (8 mg  total) by mouth 2 (two) times daily as needed for refractory nausea / vomiting. (Patient not taking: Reported on 12/16/2016) 60 tablet 2  . potassium gluconate 595 MG TABS tablet Take 595 mg by mouth daily.    . prochlorperazine (COMPAZINE) 10 MG tablet Take 1 tablet (10 mg total) by mouth every 6 (six) hours as needed (Nausea or vomiting). (Patient not taking: Reported on 12/16/2016) 60 tablet 2   No current facility-administered medications for this visit.    Facility-Administered Medications Ordered in Other Visits  Medication Dose Route Frequency Provider Last Rate Last Dose  . CARBOplatin (PARAPLATIN) 640 mg in sodium chloride 0.9 % 250 mL chemo infusion  640 mg Intravenous Once Lloyd Huger, MD      . heparin lock flush 100 unit/mL  500 Units Intracatheter Once PRN Lloyd Huger, MD      . PACLitaxel (TAXOL) 330 mg in sodium chloride 0.9 % 500 mL chemo infusion (> 76m/m2)  175 mg/m2 (Treatment Plan Recorded) Intravenous Once FLloyd Huger MD      . pegfilgrastim (NEULASTA ONPRO KIT) injection 6 mg  6 mg Subcutaneous Once FLloyd Huger MD        OBJECTIVE: Vitals:   12/16/16 0928  BP: 119/81  Pulse: 84  Resp: 18  Temp: (!) 97.1  F (36.2 C)     Body mass index is 32.14 kg/m.    ECOG FS:1 - Symptomatic but completely ambulatory  General: Well-developed, well-nourished, no acute distress. Eyes: Pink conjunctiva, anicteric sclera. HEENT: Normocephalic, moist mucous membranes, clear oropharnyx. Lungs: Clear to auscultation bilaterally. Heart: Regular rate and rhythm. No rubs, murmurs, or gallops. Abdomen: Surgical wound healing by secondary intention with good granulation tissue, approximately 3-4 cm. Significantly improved. Musculoskeletal: No edema, cyanosis, or clubbing. Neuro: Alert, answering all questions appropriately. Cranial nerves grossly intact. Skin: No rashes or petechiae noted. Psych: Normal affect.   LAB RESULTS:  Lab Results  Component Value Date   NA 139 12/16/2016   K 3.7 12/16/2016   CL 102 12/16/2016   CO2 27 12/16/2016   GLUCOSE 119 (H) 12/16/2016   BUN 12 12/16/2016   CREATININE 0.91 12/16/2016   CALCIUM 8.8 (L) 12/16/2016   PROT 6.9 12/16/2016   ALBUMIN 3.7 12/16/2016   AST 28 12/16/2016   ALT 31 12/16/2016   ALKPHOS 139 (H) 12/16/2016   BILITOT 0.3 12/16/2016   GFRNONAA >60 12/16/2016   GFRAA >60 12/16/2016    Lab Results  Component Value Date   WBC 10.3 12/16/2016   NEUTROABS 5.6 12/16/2016   HGB 12.4 12/16/2016   HCT 35.6 12/16/2016   MCV 87.6 12/16/2016   PLT 391 12/16/2016     STUDIES: Ct Abdomen Pelvis W Contrast  Result Date: 11/30/2016 CLINICAL DATA:  Incisional bleeding after surgery on 11/19/2016 for ovarian tumors and omental caking. EXAM: CT ABDOMEN AND PELVIS WITH CONTRAST TECHNIQUE: Multidetector CT imaging of the abdomen and pelvis was performed using the standard protocol following bolus administration of intravenous contrast. CONTRAST:  1079mISOVUE-300 IOPAMIDOL (ISOVUE-300) INJECTION 61% COMPARISON:  None. FINDINGS: Lower chest: Clear lungs. Top-normal sized heart without pericardial effusion. Hepatobiliary: Status post cholecystectomy. Homogeneous  enhancement of the liver with mild hepatic steatosis. No focal hepatic lesions. No biliary dilatation. Pancreas: No ductal dilatation or enhancing mass. Spleen: Normal Adrenals/Urinary Tract: Adrenal glands are unremarkable. Kidneys are normal, without renal calculi, focal lesion, or hydronephrosis. Bladder is unremarkable. Stomach/Bowel: The stomach is physiologically distended with fluid. There is normal small bowel rotation  without mechanical obstruction. A significant amount of retained fecal matter is noted throughout large bowel to the level of the distal sigmoid colon and rectum. The appendix is not confidently identified. Vascular/Lymphatic: Aortic atherosclerosis without aneurysm. No enlarged abdominal or pelvic lymph nodes. Reproductive: Status post hysterectomy.  No adnexal masses. Other: There has been debulking of previously noted omental caking. A moderate amount of low-density fluid is seen in the pelvis with smooth thin enhancement suggested, series 2 image 77 measuring 8.7 x 6.1 x 4 cm that is more likely related to peritoneal irrigation from recent surgery and not necessarily peritonitis. This warrants short-term interval follow-up and clinical correlation. Left parasagittal low-density fluid collections are seen extending to the skin surface via the patient's incision with trace amount of fluid seen interposed between the rectus muscle. This likely represents a postop seroma, measuring at its largest 5.4 x 2 x 1.9 cm. No air is identified within. No active hemorrhage is seen within. Musculoskeletal: Suspicious osseous lesions. Multilevel degenerative change within the lumbar spine with spinal fusion hardware again noted at L4-5 (previously labeled as L5-S1) but based on prior MRI labeling from 10/25/2015 is at L4-5. IMPRESSION: 1. Postop debulking of omental caking since prior comparison CT with moderate amount of fluid in the pelvis. Thin smooth enhancement surrounding the fluid is suggested  which may represent benign postop fluid status post pelvic irrigation. Early changes of a peritonitis are not entirely excluded but believed less likely. Short-term interval follow-up is therefore recommended as clinically necessary. 2. Low-density subcutaneous fluid collections along the left parasagittal incision terminating at the level of the rectus muscles. Findings likely represent postoperative seromas. No gas is seen within the subcutaneous fluid collections to indicate a potential abscess or fistulous connection. 3. Hepatic steatosis. Electronically Signed   By: Ashley Royalty M.D.   On: 11/30/2016 23:56    ASSESSMENT: Stage IIIc high-grade serous ovarian carcinoma  PLAN:  1. Stage IIIc high-grade serous ovarian carcinoma:  She recently underwent debulking surgery at [facility] on November 19, 2016. She was also initiated on a clinical trial and has received preoperative infusion of Keytruda on November 11, 2016. Now with improved wound healing, it is safe to proceed with cycle 1 of 6 of adjuvant carboplatin and Taxol today. Patient will also require Neulasta support. Patient's CA 125 has decreased significantly postoperatively and is now 316.1. Return to clinic in 1 week for laboratory work and evaluation in 3 weeks for consideration of cycle 2.  2. Wound healing: Significantly improved. Proceed with treatment as above.   Approximately 30 minutes was spent in discussion of which greater than 50% was consultation.  Patient expressed understanding and was in agreement with this plan. She also understands that She can call clinic at any time with any questions, concerns, or complaints.   Cancer Staging Ovarian cancer, unspecified laterality ([place]) Staging form: Ovary, Fallopian Tube, and Primary Peritoneal Carcinoma, AJCC 8th Edition - Clinical: No stage assigned - Unsigned   Lloyd Huger, MD   12/16/2016 10:47 AM

## 2016-12-16 ENCOUNTER — Inpatient Hospital Stay: Payer: PPO

## 2016-12-16 ENCOUNTER — Inpatient Hospital Stay: Payer: PPO | Attending: Oncology | Admitting: Oncology

## 2016-12-16 VITALS — BP 119/81 | HR 84 | Temp 97.1°F | Resp 18 | Wt 175.7 lb

## 2016-12-16 DIAGNOSIS — Z79891 Long term (current) use of opiate analgesic: Secondary | ICD-10-CM | POA: Diagnosis not present

## 2016-12-16 DIAGNOSIS — R197 Diarrhea, unspecified: Secondary | ICD-10-CM | POA: Insufficient documentation

## 2016-12-16 DIAGNOSIS — Z7689 Persons encountering health services in other specified circumstances: Secondary | ICD-10-CM | POA: Diagnosis not present

## 2016-12-16 DIAGNOSIS — Z85828 Personal history of other malignant neoplasm of skin: Secondary | ICD-10-CM | POA: Insufficient documentation

## 2016-12-16 DIAGNOSIS — C7960 Secondary malignant neoplasm of unspecified ovary: Secondary | ICD-10-CM

## 2016-12-16 DIAGNOSIS — E559 Vitamin D deficiency, unspecified: Secondary | ICD-10-CM | POA: Insufficient documentation

## 2016-12-16 DIAGNOSIS — K760 Fatty (change of) liver, not elsewhere classified: Secondary | ICD-10-CM | POA: Diagnosis not present

## 2016-12-16 DIAGNOSIS — C569 Malignant neoplasm of unspecified ovary: Secondary | ICD-10-CM | POA: Diagnosis not present

## 2016-12-16 DIAGNOSIS — Z79899 Other long term (current) drug therapy: Secondary | ICD-10-CM | POA: Diagnosis not present

## 2016-12-16 DIAGNOSIS — D72829 Elevated white blood cell count, unspecified: Secondary | ICD-10-CM | POA: Diagnosis not present

## 2016-12-16 DIAGNOSIS — F419 Anxiety disorder, unspecified: Secondary | ICD-10-CM | POA: Insufficient documentation

## 2016-12-16 DIAGNOSIS — E876 Hypokalemia: Secondary | ICD-10-CM | POA: Insufficient documentation

## 2016-12-16 DIAGNOSIS — E785 Hyperlipidemia, unspecified: Secondary | ICD-10-CM | POA: Diagnosis not present

## 2016-12-16 DIAGNOSIS — Z87891 Personal history of nicotine dependence: Secondary | ICD-10-CM | POA: Diagnosis not present

## 2016-12-16 DIAGNOSIS — Z5111 Encounter for antineoplastic chemotherapy: Secondary | ICD-10-CM | POA: Diagnosis not present

## 2016-12-16 DIAGNOSIS — J45909 Unspecified asthma, uncomplicated: Secondary | ICD-10-CM | POA: Insufficient documentation

## 2016-12-16 DIAGNOSIS — K219 Gastro-esophageal reflux disease without esophagitis: Secondary | ICD-10-CM | POA: Diagnosis not present

## 2016-12-16 DIAGNOSIS — I700 Atherosclerosis of aorta: Secondary | ICD-10-CM | POA: Diagnosis not present

## 2016-12-16 LAB — COMPREHENSIVE METABOLIC PANEL
ALT: 31 U/L (ref 14–54)
ANION GAP: 10 (ref 5–15)
AST: 28 U/L (ref 15–41)
Albumin: 3.7 g/dL (ref 3.5–5.0)
Alkaline Phosphatase: 139 U/L — ABNORMAL HIGH (ref 38–126)
BUN: 12 mg/dL (ref 6–20)
CALCIUM: 8.8 mg/dL — AB (ref 8.9–10.3)
CHLORIDE: 102 mmol/L (ref 101–111)
CO2: 27 mmol/L (ref 22–32)
CREATININE: 0.91 mg/dL (ref 0.44–1.00)
GFR calc non Af Amer: >60 mL/min (ref >60)
Glucose, Bld: 119 mg/dL — ABNORMAL HIGH (ref 65–99)
POTASSIUM: 3.7 mmol/L (ref 3.5–5.1)
SODIUM: 139 mmol/L (ref 135–145)
Total Bilirubin: 0.3 mg/dL (ref 0.3–1.2)
Total Protein: 6.9 g/dL (ref 6.5–8.1)

## 2016-12-16 LAB — CBC WITH DIFFERENTIAL/PLATELET
BASOS ABS: 0.1 10*3/uL (ref 0–0.1)
BASOS PCT: 1 %
EOS ABS: 1 10*3/uL — AB (ref 0–0.7)
EOS PCT: 10 %
HCT: 35.6 % (ref 35.0–47.0)
Hemoglobin: 12.4 g/dL (ref 12.0–16.0)
LYMPHS PCT: 27 %
Lymphs Abs: 2.8 10*3/uL (ref 1.0–3.6)
MCH: 30.4 pg (ref 26.0–34.0)
MCHC: 34.8 g/dL (ref 32.0–36.0)
MCV: 87.6 fL (ref 80.0–100.0)
MONO ABS: 0.8 10*3/uL (ref 0.2–0.9)
Monocytes Relative: 8 %
Neutro Abs: 5.6 10*3/uL (ref 1.4–6.5)
Neutrophils Relative %: 54 %
PLATELETS: 391 10*3/uL (ref 150–440)
RBC: 4.06 MIL/uL (ref 3.80–5.20)
RDW: 12.3 % (ref 11.5–14.5)
WBC: 10.3 10*3/uL (ref 3.6–11.0)

## 2016-12-16 MED ORDER — PEGFILGRASTIM 6 MG/0.6ML ~~LOC~~ PSKT
6.0000 mg | PREFILLED_SYRINGE | Freq: Once | SUBCUTANEOUS | Status: AC
  Administered 2016-12-16: 6 mg via SUBCUTANEOUS
  Filled 2016-12-16: qty 0.6

## 2016-12-16 MED ORDER — LORAZEPAM 2 MG/ML IJ SOLN
0.5000 mg | Freq: Once | INTRAMUSCULAR | Status: AC
  Administered 2016-12-16: 0.5 mg via INTRAVENOUS
  Filled 2016-12-16: qty 1

## 2016-12-16 MED ORDER — SODIUM CHLORIDE 0.9 % IV SOLN
175.0000 mg/m2 | Freq: Once | INTRAVENOUS | Status: AC
  Administered 2016-12-16: 330 mg via INTRAVENOUS
  Filled 2016-12-16: qty 55

## 2016-12-16 MED ORDER — ALPRAZOLAM 0.25 MG PO TABS: 0.2500 mg | ORAL_TABLET | Freq: Two times a day (BID) | ORAL | 0 refills | Status: DC | PRN

## 2016-12-16 MED ORDER — SODIUM CHLORIDE 0.9 % IV SOLN: 10.0000 mg | Freq: Once | INTRAVENOUS | Status: DC

## 2016-12-16 MED ORDER — SODIUM CHLORIDE 0.9 % IV SOLN
640.0000 mg | Freq: Once | INTRAVENOUS | Status: AC
  Administered 2016-12-16: 640 mg via INTRAVENOUS
  Filled 2016-12-16: qty 64

## 2016-12-16 MED ORDER — SODIUM CHLORIDE 0.9 % IV SOLN
Freq: Once | INTRAVENOUS | Status: AC
  Administered 2016-12-16: 10:00:00 via INTRAVENOUS
  Filled 2016-12-16: qty 1000

## 2016-12-16 MED ORDER — PALONOSETRON HCL INJECTION 0.25 MG/5ML
0.2500 mg | Freq: Once | INTRAVENOUS | Status: AC
  Administered 2016-12-16: 0.25 mg via INTRAVENOUS
  Filled 2016-12-16: qty 5

## 2016-12-16 MED ORDER — DIPHENHYDRAMINE HCL 50 MG/ML IJ SOLN
25.0000 mg | Freq: Once | INTRAMUSCULAR | Status: AC
  Administered 2016-12-16: 25 mg via INTRAVENOUS
  Filled 2016-12-16: qty 1

## 2016-12-16 MED ORDER — FAMOTIDINE IN NACL 20-0.9 MG/50ML-% IV SOLN
20.0000 mg | Freq: Once | INTRAVENOUS | Status: AC
  Administered 2016-12-16: 20 mg via INTRAVENOUS
  Filled 2016-12-16: qty 50

## 2016-12-16 MED ORDER — DEXAMETHASONE SODIUM PHOSPHATE 10 MG/ML IJ SOLN
10.0000 mg | Freq: Once | INTRAMUSCULAR | Status: AC
  Administered 2016-12-16: 10 mg via INTRAVENOUS
  Filled 2016-12-16: qty 1

## 2016-12-16 MED ORDER — HEPARIN SOD (PORK) LOCK FLUSH 100 UNIT/ML IV SOLN
500.0000 [IU] | Freq: Once | INTRAVENOUS | Status: AC | PRN
  Administered 2016-12-16: 500 [IU]
  Filled 2016-12-16: qty 5

## 2016-12-17 ENCOUNTER — Inpatient Hospital Stay: Payer: PPO

## 2016-12-17 LAB — CA 125: CANCER ANTIGEN (CA) 125: 266.7 U/mL — AB (ref 0.0–38.1)

## 2016-12-17 NOTE — Progress Notes (Unsigned)
Patient in clinic today due to on-body injector accidentally pulled out.  Injector removed and patient rescheduled to return to clinic tomorrow for neulasta injection.

## 2016-12-18 ENCOUNTER — Inpatient Hospital Stay: Payer: PPO

## 2016-12-18 DIAGNOSIS — Z9229 Personal history of other drug therapy: Secondary | ICD-10-CM

## 2016-12-18 DIAGNOSIS — C569 Malignant neoplasm of unspecified ovary: Secondary | ICD-10-CM

## 2016-12-18 DIAGNOSIS — Z5111 Encounter for antineoplastic chemotherapy: Secondary | ICD-10-CM | POA: Diagnosis not present

## 2016-12-18 DIAGNOSIS — C786 Secondary malignant neoplasm of retroperitoneum and peritoneum: Secondary | ICD-10-CM

## 2016-12-18 DIAGNOSIS — C801 Malignant (primary) neoplasm, unspecified: Secondary | ICD-10-CM

## 2016-12-18 DIAGNOSIS — Z1379 Encounter for other screening for genetic and chromosomal anomalies: Secondary | ICD-10-CM | POA: Insufficient documentation

## 2016-12-18 MED ORDER — PEGFILGRASTIM INJECTION 6 MG/0.6ML ~~LOC~~: 6.0000 mg | PREFILLED_SYRINGE | Freq: Once | SUBCUTANEOUS | 0 refills | Status: AC

## 2016-12-18 MED ORDER — PEGFILGRASTIM INJECTION 6 MG/0.6ML ~~LOC~~
6.0000 mg | PREFILLED_SYRINGE | Freq: Once | SUBCUTANEOUS | Status: AC
  Administered 2016-12-18: 6 mg via SUBCUTANEOUS
  Filled 2016-12-18: qty 0.6

## 2016-12-22 NOTE — Progress Notes (Signed)
[place]  [phone]) [phone] Fax:(336) [phone]  ID: Jessica Burgess OB: [birth year]  MR#: [id number]  EYC#:[id number]  Patient Care Team: Steele Sizer, MD as PCP - General (Family Medicine) Clent Jacks, RN as Registered Nurse  CHIEF COMPLAINT: Stage IIIc high-grade serous ovarian carcinoma  INTERVAL HISTORY: Patient returns to clinic today for further evaluation and to assess her toleration of cycle 1 of 6 of carboplatinum and Taxol. Patient had significant bone pain from her Neulasta that lasted several days. On day 4 or day 5 after treatment, she developed significant diarrhea. She received IV fluids earlier today and feels symptomatically improved. She has increased weakness and fatigue and a poor appetite. The operative wound in her abdomen continues to improve. Patient continues to be highly anxious. She has no neurologic complaints. She denies any recent fevers. She has no chest pain or shortness of breath. She has no urinary complaints. Patient offers no further specific complaints today.  REVIEW OF SYSTEMS:   Review of Systems  Constitutional: Positive for malaise/fatigue. Negative for fever and weight loss.  Respiratory: Negative.  Negative for cough and shortness of breath.   Cardiovascular: Negative.  Negative for chest pain and leg swelling.  Gastrointestinal: Positive for abdominal pain, diarrhea and nausea. Negative for blood in stool, constipation, melena and vomiting.  Genitourinary: Negative.   Musculoskeletal: Positive for back pain, joint pain and neck pain.  Skin: Negative.  Negative for rash.  Neurological: Positive for weakness. Negative for sensory change.  Psychiatric/Behavioral: The patient is nervous/anxious.     As per HPI. Otherwise, a complete review of systems is negative.  PAST MEDICAL HISTORY: Past Medical History:  Diagnosis Date  . Allergic rhinitis, cause unspecified   . Anxiety state, unspecified   . Arthritis    . Asthma    only when sick   . Backache, unspecified   . Bronchitis    hx of when get sick  . Cancer ([place])    skin cancer , basal cell   . Cervicalgia   . Complication of anesthesia   . Dermatophytosis of nail   . Dysmetabolic syndrome X   . Encounter for long-term (current) use of other medications   . Esophageal reflux   . Insomnia, unspecified   . Leukocytosis, unspecified   . Migraine without aura, without mention of intractable migraine without mention of status migrainosus   . Other and unspecified hyperlipidemia   . Other malaise and fatigue   . Overweight(278.02)   . PONV (postoperative nausea and vomiting)   . Spinal stenosis in cervical region   . Symptomatic menopausal or female climacteric states   . Unspecified disorder of skin and subcutaneous tissue   . Unspecified vitamin D deficiency     PAST SURGICAL HISTORY: Past Surgical History:  Procedure Laterality Date  . ABDOMINAL HYSTERECTOMY    . ANTERIOR CERVICAL DECOMP/DISCECTOMY FUSION N/A 06/07/2015   Procedure: Cervical three - four and Cervical six- seven anterior cervical decompression with fusion interbody prosthesis plating and bonegraft;  Surgeon: Newman Pies, MD;  Location: [facility];  Service: Neurosurgery;  Laterality: N/A;  C34 and C67 anterior cervical decompression with fusion interbody prosthesis plating and bonegraft  . BACK SURGERY     x2 Lower   . EVACUATION OF CERVICAL HEMATOMA N/A 06/14/2015   Procedure: EVACUATION OF CERVICAL HEMATOMA;  Surgeon: Newman Pies, MD;  Location: [facility];  Service: Neurosurgery;  Laterality: N/A;  . NECK SURGERY     x3  .  TONSILLECTOMY      FAMILY HISTORY: Family History  Problem Relation Age of Onset  . Depression Mother   . Migraines Mother   . Dementia Father   . Hyperlipidemia Brother   . Breast cancer Paternal Aunt        51s    ADVANCED DIRECTIVES (Y/N):  N  HEALTH MAINTENANCE: Social History  Substance Use Topics  . Smoking  status: Former Smoker    Packs/day: 1.50    Years: 20.00    Types: Cigarettes    Start date: 03/19/1979    Quit date: 08/25/1999  . Smokeless tobacco: Never Used  . Alcohol use 0.0 oz/week     Comment: wine occassionally     Colonoscopy:  PAP:  Bone density:  Lipid panel:  No Known Allergies  Current Outpatient Prescriptions  Medication Sig Dispense Refill  . ALPRAZolam (XANAX) 0.25 MG tablet Take 1 tablet (0.25 mg total) by mouth 2 (two) times daily as needed for anxiety. 60 tablet 0  . DULoxetine (CYMBALTA) 60 MG capsule take 1 capsule by mouth once daily 90 capsule 1  . estradiol (ESTRACE) 0.5 MG tablet take 1 tablet by mouth once daily 90 tablet 4  . fluticasone (FLONASE) 50 MCG/ACT nasal spray Place 2 sprays into both nostrils as needed. 16 g 5  . gabapentin (NEURONTIN) 300 MG capsule Take 1 capsule (300 mg total) by mouth 3 (three) times daily. Titrate to up to 300 mg three times daily 270 capsule 0  . levothyroxine (SYNTHROID, LEVOTHROID) 50 MCG tablet Take 1 tablet by mouth daily.    Marland Kitchen lidocaine-prilocaine (EMLA) cream Apply to affected area once 30 g 3  . Multiple Vitamins-Minerals (MULTIVITAMIN PO) Take 1 tablet by mouth daily.     . Omega-3 Fatty Acids (FISH OIL PO) Take by mouth.    . oxyCODONE-acetaminophen (PERCOCET) 10-325 MG tablet Take 1 tablet by mouth every 8 (eight) hours as needed for pain. 90 tablet 0  . potassium gluconate 595 MG TABS tablet Take 595 mg by mouth daily.    . SUMAtriptan (IMITREX) 100 MG tablet take 1 tablet by mouth if needed for migraines as directed 27 tablet 1  . zolpidem (AMBIEN CR) 6.25 MG CR tablet take 1 tablet at bedtime 90 tablet 0  . albuterol (PROVENTIL HFA;VENTOLIN HFA) 108 (90 Base) MCG/ACT inhaler Inhale 2 puffs into the lungs every 6 (six) hours as needed for wheezing or shortness of breath. (Patient not taking: Reported on 12/16/2016) 1 Inhaler 2  . albuterol (PROVENTIL) (2.5 MG/3ML) 0.083% nebulizer solution Take 3 mLs (2.5 mg  total) by nebulization every 6 (six) hours as needed for wheezing or shortness of breath. (Patient not taking: Reported on 12/16/2016) 75 mL 2  . Ascorbic Acid (VITAMIN C PO) Take by mouth.    Marland Kitchen atorvastatin (LIPITOR) 40 MG tablet take 1 tablet by mouth once daily (Patient not taking: Reported on 12/23/2016) 90 tablet 4  . Cholecalciferol (VITAMIN D-1000 MAX ST) 1000 UNITS tablet Take 1 tablet by mouth 2 (two) times daily.    . Cyanocobalamin (B-12 PO) Take 1 tablet by mouth daily.     Marland Kitchen docusate sodium (COLACE) 100 MG capsule Take 1 capsule (100 mg total) by mouth 2 (two) times daily. (Patient not taking: Reported on 12/23/2016) 60 capsule 0  . enoxaparin (LOVENOX) 40 MG/0.4ML injection Inject 40 mg into the skin daily.    . fentaNYL (DURAGESIC - DOSED MCG/HR) 25 MCG/HR patch Place 1 patch (25 mcg total) onto the  skin every 3 (three) days. 10 patch 0  . ferrous sulfate (IRON SUPPLEMENT) 325 (65 FE) MG tablet Take 1 tablet by mouth daily.    . Magnesium Oxide 500 MG CAPS Take 500 mg by mouth 2 (two) times daily.     . metFORMIN (GLUCOPHAGE) 500 MG tablet Take 1 tablet (500 mg total) by mouth daily. (Patient not taking: Reported on 12/23/2016) 90 tablet 1  . ondansetron (ZOFRAN) 8 MG tablet Take 1 tablet (8 mg total) by mouth 2 (two) times daily as needed for refractory nausea / vomiting. (Patient not taking: Reported on 12/16/2016) 60 tablet 2  . prochlorperazine (COMPAZINE) 10 MG tablet Take 1 tablet (10 mg total) by mouth every 6 (six) hours as needed (Nausea or vomiting). (Patient not taking: Reported on 12/16/2016) 60 tablet 2   No current facility-administered medications for this visit.     OBJECTIVE: Vitals:   12/23/16 1513  BP: 120/73  Pulse: 84  Resp: 18  Temp: 98.1 F (36.7 C)     Body mass index is 31.46 kg/m.    ECOG FS:1 - Symptomatic but completely ambulatory  General: Well-developed, well-nourished, no acute distress. Eyes: Pink conjunctiva, anicteric sclera. HEENT:  Normocephalic, moist mucous membranes, clear oropharnyx. Lungs: Clear to auscultation bilaterally. Heart: Regular rate and rhythm. No rubs, murmurs, or gallops. Abdomen: Surgical wound healing by secondary intention with good granulation tissue, approximately 3-4 cm. Significantly improved. Musculoskeletal: No edema, cyanosis, or clubbing. Neuro: Alert, answering all questions appropriately. Cranial nerves grossly intact. Skin: No rashes or petechiae noted. Psych: Normal affect.   LAB RESULTS:  Lab Results  Component Value Date   NA 132 (L) 12/23/2016   K 3.1 (L) 12/23/2016   CL 98 (L) 12/23/2016   CO2 25 12/23/2016   GLUCOSE 113 (H) 12/23/2016   BUN 11 12/23/2016   CREATININE 1.19 (H) 12/23/2016   CALCIUM 8.4 (L) 12/23/2016   PROT 7.0 12/23/2016   ALBUMIN 3.6 12/23/2016   AST 29 12/23/2016   ALT 98 (H) 12/23/2016   ALKPHOS 273 (H) 12/23/2016   BILITOT 0.2 (L) 12/23/2016   GFRNONAA 48 (L) 12/23/2016   GFRAA 56 (L) 12/23/2016    Lab Results  Component Value Date   WBC 21.9 (H) 12/23/2016   NEUTROABS 14.4 (H) 12/23/2016   HGB 12.2 12/23/2016   HCT 36.0 12/23/2016   MCV 87.3 12/23/2016   PLT 300 12/23/2016     STUDIES: Ct Abdomen Pelvis W Contrast  Result Date: 11/30/2016 CLINICAL DATA:  Incisional bleeding after surgery on 11/19/2016 for ovarian tumors and omental caking. EXAM: CT ABDOMEN AND PELVIS WITH CONTRAST TECHNIQUE: Multidetector CT imaging of the abdomen and pelvis was performed using the standard protocol following bolus administration of intravenous contrast. CONTRAST:  16mL ISOVUE-300 IOPAMIDOL (ISOVUE-300) INJECTION 61% COMPARISON:  None. FINDINGS: Lower chest: Clear lungs. Top-normal sized heart without pericardial effusion. Hepatobiliary: Status post cholecystectomy. Homogeneous enhancement of the liver with mild hepatic steatosis. No focal hepatic lesions. No biliary dilatation. Pancreas: No ductal dilatation or enhancing mass. Spleen: Normal  Adrenals/Urinary Tract: Adrenal glands are unremarkable. Kidneys are normal, without renal calculi, focal lesion, or hydronephrosis. Bladder is unremarkable. Stomach/Bowel: The stomach is physiologically distended with fluid. There is normal small bowel rotation without mechanical obstruction. A significant amount of retained fecal matter is noted throughout large bowel to the level of the distal sigmoid colon and rectum. The appendix is not confidently identified. Vascular/Lymphatic: Aortic atherosclerosis without aneurysm. No enlarged abdominal or pelvic lymph nodes. Reproductive: Status post  hysterectomy.  No adnexal masses. Other: There has been debulking of previously noted omental caking. A moderate amount of low-density fluid is seen in the pelvis with smooth thin enhancement suggested, series 2 image 77 measuring 8.7 x 6.1 x 4 cm that is more likely related to peritoneal irrigation from recent surgery and not necessarily peritonitis. This warrants short-term interval follow-up and clinical correlation. Left parasagittal low-density fluid collections are seen extending to the skin surface via the patient's incision with trace amount of fluid seen interposed between the rectus muscle. This likely represents a postop seroma, measuring at its largest 5.4 x 2 x 1.9 cm. No air is identified within. No active hemorrhage is seen within. Musculoskeletal: Suspicious osseous lesions. Multilevel degenerative change within the lumbar spine with spinal fusion hardware again noted at L4-5 (previously labeled as L5-S1) but based on prior MRI labeling from 10/25/2015 is at L4-5. IMPRESSION: 1. Postop debulking of omental caking since prior comparison CT with moderate amount of fluid in the pelvis. Thin smooth enhancement surrounding the fluid is suggested which may represent benign postop fluid status post pelvic irrigation. Early changes of a peritonitis are not entirely excluded but believed less likely. Short-term  interval follow-up is therefore recommended as clinically necessary. 2. Low-density subcutaneous fluid collections along the left parasagittal incision terminating at the level of the rectus muscles. Findings likely represent postoperative seromas. No gas is seen within the subcutaneous fluid collections to indicate a potential abscess or fistulous connection. 3. Hepatic steatosis. Electronically Signed   By: Ashley Royalty M.D.   On: 11/30/2016 23:56    ASSESSMENT: Stage IIIc high-grade serous ovarian carcinoma  PLAN:  1. Stage IIIc high-grade serous ovarian carcinoma:  She recently underwent debulking surgery at [facility] on November 19, 2016. She was also initiated on a clinical trial and has received preoperative infusion of Keytruda on November 11, 2016. Patient tolerated cycle 1 of 6 of adjuvant carboplatin and Taxol last week with increased bony pain and diarrhea as her main complaints. Continue with Neulasta support. Patient's CA 125 has decreased significantly postoperatively and is now 266.7. Return to clinic in 2 weeks for consideration of cycle 2.  2. Wound healing: Significantly improved. Proceed with treatment as above.  3. Leukocytosis: Secondary to Neulasta. 4. Pain: Patient on chronic narcotics from previous back surgeries. Talked at length with the patient and it was agreed upon that oncology would manage her pain while she is on chemotherapy, but then once chemotherapy is completed, will refer her pain management back to her primary care physician. Patient was given a prescription for fentanyl patches today and instructed to continue Percocet as prescribed. 5. Diarrhea: IV fluids today. Continue Imodium as needed. 6. Hypokalemia: Patient received 40 mEq IV potassium today. 7. Anxiety: Patient reported increased anxiety for 24-48 hrs. after treatment. This is likely secondary to dexamethasone which will be discontinued as a premed.  Approximately 30 minutes was spent in discussion  of which greater than 50% was consultation.  Patient expressed understanding and was in agreement with this plan. She also understands that She can call clinic at any time with any questions, concerns, or complaints.   Cancer Staging Ovarian cancer, unspecified laterality ([place]) Staging form: Ovary, Fallopian Tube, and Primary Peritoneal Carcinoma, AJCC 8th Edition - Clinical: No stage assigned - Unsigned   Lloyd Huger, MD   12/23/2016 4:21 PM

## 2016-12-23 ENCOUNTER — Other Ambulatory Visit: Payer: Self-pay | Admitting: *Deleted

## 2016-12-23 ENCOUNTER — Inpatient Hospital Stay (HOSPITAL_BASED_OUTPATIENT_CLINIC_OR_DEPARTMENT_OTHER): Payer: PPO | Admitting: Oncology

## 2016-12-23 ENCOUNTER — Telehealth: Payer: Self-pay | Admitting: *Deleted

## 2016-12-23 ENCOUNTER — Inpatient Hospital Stay: Payer: PPO

## 2016-12-23 VITALS — BP 119/77 | HR 94 | Temp 96.2°F | Resp 18 | Wt 169.5 lb

## 2016-12-23 VITALS — BP 120/73 | HR 84 | Temp 98.1°F | Resp 18 | Wt 172.0 lb

## 2016-12-23 DIAGNOSIS — Z79899 Other long term (current) drug therapy: Secondary | ICD-10-CM | POA: Diagnosis not present

## 2016-12-23 DIAGNOSIS — F419 Anxiety disorder, unspecified: Secondary | ICD-10-CM

## 2016-12-23 DIAGNOSIS — R197 Diarrhea, unspecified: Secondary | ICD-10-CM

## 2016-12-23 DIAGNOSIS — E876 Hypokalemia: Secondary | ICD-10-CM | POA: Diagnosis not present

## 2016-12-23 DIAGNOSIS — E860 Dehydration: Secondary | ICD-10-CM

## 2016-12-23 DIAGNOSIS — M5441 Lumbago with sciatica, right side: Secondary | ICD-10-CM

## 2016-12-23 DIAGNOSIS — C569 Malignant neoplasm of unspecified ovary: Secondary | ICD-10-CM | POA: Diagnosis not present

## 2016-12-23 DIAGNOSIS — M542 Cervicalgia: Secondary | ICD-10-CM

## 2016-12-23 DIAGNOSIS — D72829 Elevated white blood cell count, unspecified: Secondary | ICD-10-CM | POA: Diagnosis not present

## 2016-12-23 DIAGNOSIS — Z5111 Encounter for antineoplastic chemotherapy: Secondary | ICD-10-CM | POA: Diagnosis not present

## 2016-12-23 DIAGNOSIS — G8929 Other chronic pain: Secondary | ICD-10-CM

## 2016-12-23 LAB — COMPREHENSIVE METABOLIC PANEL
ALK PHOS: 273 U/L — AB (ref 38–126)
ALT: 98 U/L — AB (ref 14–54)
AST: 29 U/L (ref 15–41)
Albumin: 3.6 g/dL (ref 3.5–5.0)
Anion gap: 9 (ref 5–15)
BILIRUBIN TOTAL: 0.2 mg/dL — AB (ref 0.3–1.2)
BUN: 11 mg/dL (ref 6–20)
CALCIUM: 8.4 mg/dL — AB (ref 8.9–10.3)
CO2: 25 mmol/L (ref 22–32)
CREATININE: 1.19 mg/dL — AB (ref 0.44–1.00)
Chloride: 98 mmol/L — ABNORMAL LOW (ref 101–111)
GFR calc non Af Amer: 48 mL/min — ABNORMAL LOW (ref >60)
GFR, EST AFRICAN AMERICAN: 56 mL/min — AB (ref >60)
Glucose, Bld: 113 mg/dL — ABNORMAL HIGH (ref 65–99)
Potassium: 3.1 mmol/L — ABNORMAL LOW (ref 3.5–5.1)
SODIUM: 132 mmol/L — AB (ref 135–145)
TOTAL PROTEIN: 7 g/dL (ref 6.5–8.1)

## 2016-12-23 LAB — CBC WITH DIFFERENTIAL/PLATELET
BAND NEUTROPHILS: 5 %
BASOS ABS: 0 10*3/uL (ref 0–0.1)
BASOS PCT: 0 %
EOS PCT: 4 %
Eosinophils Absolute: 0.9 10*3/uL — ABNORMAL HIGH (ref 0–0.7)
HEMATOCRIT: 36 % (ref 35.0–47.0)
HEMOGLOBIN: 12.2 g/dL (ref 12.0–16.0)
LYMPHS PCT: 21 %
Lymphs Abs: 4.6 10*3/uL — ABNORMAL HIGH (ref 1.0–3.6)
MCH: 29.7 pg (ref 26.0–34.0)
MCHC: 34 g/dL (ref 32.0–36.0)
MCV: 87.3 fL (ref 80.0–100.0)
MONOS PCT: 9 %
Monocytes Absolute: 2 10*3/uL — ABNORMAL HIGH (ref 0.2–0.9)
NEUTROS PCT: 61 %
Neutro Abs: 14.4 10*3/uL — ABNORMAL HIGH (ref 1.4–6.5)
Platelets: 300 10*3/uL (ref 150–440)
RBC: 4.13 MIL/uL (ref 3.80–5.20)
RDW: 12.4 % (ref 11.5–14.5)
WBC: 21.9 10*3/uL — ABNORMAL HIGH (ref 3.6–11.0)

## 2016-12-23 LAB — MAGNESIUM: Magnesium: 1.6 mg/dL — ABNORMAL LOW (ref 1.7–2.4)

## 2016-12-23 MED ORDER — HEPARIN SOD (PORK) LOCK FLUSH 100 UNIT/ML IV SOLN
500.0000 [IU] | Freq: Once | INTRAVENOUS | Status: AC
  Administered 2016-12-23: 500 [IU] via INTRAVENOUS

## 2016-12-23 MED ORDER — FENTANYL 25 MCG/HR TD PT72: 25.0000 ug | MEDICATED_PATCH | TRANSDERMAL | 0 refills | Status: DC

## 2016-12-23 MED ORDER — OXYCODONE-ACETAMINOPHEN 10-325 MG PO TABS: 1.0000 | ORAL_TABLET | Freq: Three times a day (TID) | ORAL | 0 refills | Status: DC | PRN

## 2016-12-23 MED ORDER — SODIUM CHLORIDE 0.9% FLUSH
10.0000 mL | INTRAVENOUS | Status: DC | PRN
Start: 2016-12-23 — End: 2016-12-23
  Administered 2016-12-23: 10 mL via INTRAVENOUS
  Filled 2016-12-23: qty 10

## 2016-12-23 MED ORDER — SODIUM CHLORIDE 0.9 % IV SOLN
Freq: Once | INTRAVENOUS | Status: AC
  Administered 2016-12-23: 12:00:00 via INTRAVENOUS
  Filled 2016-12-23: qty 1000

## 2016-12-23 MED ORDER — HEPARIN SOD (PORK) LOCK FLUSH 100 UNIT/ML IV SOLN
INTRAVENOUS | Status: AC
Start: 2016-12-23 — End: ?
  Filled 2016-12-23: qty 5

## 2016-12-23 NOTE — Telephone Encounter (Signed)
Called pt to see how she is feeling this morning, per friend [name] pt is still not feeling well. I advised [name] that we do not have availability in [place] to add on for IVF today but we could have pt scheduled in the [facility] clinic. Pt is going to [facility] for lab/IVF at 11:00 this morning, will keep her scheduled f/u with Dr. Grayland Ormond at 3:15 this afternoon in [place].

## 2016-12-23 NOTE — Patient Instructions (Signed)

## 2017-01-04 NOTE — Progress Notes (Signed)
Jessica Burgess  [phone] [phone]) [phone]  ID: [name] OB: 1955-10-11  MR#: [id number]  BSJ#:[id number]  Patient Care Team: Steele Sizer, MD as PCP - General (Family Medicine) Clent Jacks, RN as Registered Nurse  CHIEF COMPLAINT: Stage IIIc high-grade serous ovarian carcinoma  INTERVAL HISTORY: Patient returns to clinic today for further evaluation and consideration of cycle 2 of 6 of carboplatinum and Taxol. She currently feels well and is asymptomatic. She is having bilateral hand joint pain. Her abdominal wound is nearly completely healed. Her appetite has improved and she no longer has weakness or fatigue. Patient continues to be highly anxious. She has no neurologic complaints. She denies any recent fevers. She has no chest pain or shortness of breath. She denies any nausea, vomiting, constipation, or diarrhea. She has no urinary complaints. Patient offers no further specific complaints today.  REVIEW OF SYSTEMS:   Review of Systems  Constitutional: Negative.  Negative for fever, malaise/fatigue and weight loss.  Respiratory: Negative.  Negative for cough and shortness of breath.   Cardiovascular: Negative.  Negative for chest pain and leg swelling.  Gastrointestinal: Negative.  Negative for abdominal pain, blood in stool, constipation, diarrhea, melena, nausea and vomiting.  Genitourinary: Negative.   Musculoskeletal: Positive for back pain, joint pain and neck pain.  Skin: Negative.  Negative for rash.  Neurological: Negative for sensory change and weakness.  Psychiatric/Behavioral: The patient is nervous/anxious.     As per HPI. Otherwise, a complete review of systems is negative.  PAST MEDICAL HISTORY: Past Medical History:  Diagnosis Date  . Allergic rhinitis, cause unspecified   . Anxiety state, unspecified   . Arthritis   . Asthma    only when sick   . Backache, unspecified   . Bronchitis    hx of when get sick  .  Cancer ([place])    skin cancer , basal cell   . Cervicalgia   . Complication of anesthesia   . Dermatophytosis of nail   . Dysmetabolic syndrome X   . Encounter for long-term (current) use of other medications   . Esophageal reflux   . Insomnia, unspecified   . Leukocytosis, unspecified   . Migraine without aura, without mention of intractable migraine without mention of status migrainosus   . Other and unspecified hyperlipidemia   . Other malaise and fatigue   . Overweight(278.02)   . PONV (postoperative nausea and vomiting)   . Spinal stenosis in cervical region   . Symptomatic menopausal or female climacteric states   . Unspecified disorder of skin and subcutaneous tissue   . Unspecified vitamin D deficiency     PAST SURGICAL HISTORY: Past Surgical History:  Procedure Laterality Date  . ABDOMINAL HYSTERECTOMY    . ANTERIOR CERVICAL DECOMP/DISCECTOMY FUSION N/A 06/07/2015   Procedure: Cervical three - four and Cervical six- seven anterior cervical decompression with fusion interbody prosthesis plating and bonegraft;  Surgeon: Newman Pies, MD;  Location: [place] [facility];  Service: Neurosurgery;  Laterality: N/A;  C34 and C67 anterior cervical decompression with fusion interbody prosthesis plating and bonegraft  . BACK SURGERY     x2 Lower   . EVACUATION OF CERVICAL HEMATOMA N/A 06/14/2015   Procedure: EVACUATION OF CERVICAL HEMATOMA;  Surgeon: Newman Pies, MD;  Location: [place] [facility];  Service: Neurosurgery;  Laterality: N/A;  . NECK SURGERY     x3  . TONSILLECTOMY      FAMILY HISTORY: Family History  Problem Relation Age of Onset  .  Depression Mother   . Migraines Mother   . Dementia Father   . Hyperlipidemia Brother   . Breast cancer Paternal Aunt        79s    ADVANCED DIRECTIVES (Y/N):  N  HEALTH MAINTENANCE: Social History  Substance Use Topics  . Smoking status: Former Smoker    Packs/day: 1.50    Years: 20.00    Types: Cigarettes    Start date: 03/19/1979     Quit date: 08/25/1999  . Smokeless tobacco: Never Used  . Alcohol use 0.0 oz/week     Comment: wine occassionally     Colonoscopy:  PAP:  Bone density:  Lipid panel:  No Known Allergies  Current Outpatient Prescriptions  Medication Sig Dispense Refill  . ALPRAZolam (XANAX) 0.25 MG tablet Take 1 tablet (0.25 mg total) by mouth 2 (two) times daily as needed for anxiety. 60 tablet 0  . Ascorbic Acid (VITAMIN C PO) Take by mouth.    . Cholecalciferol (VITAMIN D-1000 MAX ST) 1000 UNITS tablet Take 1 tablet by mouth 2 (two) times daily.    . Cyanocobalamin (B-12 PO) Take 1 tablet by mouth daily.     Marland Kitchen docusate sodium (COLACE) 100 MG capsule Take 1 capsule (100 mg total) by mouth 2 (two) times daily. 60 capsule 0  . DULoxetine (CYMBALTA) 60 MG capsule take 1 capsule by mouth once daily 90 capsule 1  . estradiol (ESTRACE) 0.5 MG tablet take 1 tablet by mouth once daily 90 tablet 4  . fentaNYL (DURAGESIC - DOSED MCG/HR) 25 MCG/HR patch Place 1 patch (25 mcg total) onto the skin every 3 (three) days. 10 patch 0  . ferrous sulfate (IRON SUPPLEMENT) 325 (65 FE) MG tablet Take 1 tablet by mouth daily.    . fluticasone (FLONASE) 50 MCG/ACT nasal spray Place 2 sprays into both nostrils as needed. 16 g 5  . gabapentin (NEURONTIN) 300 MG capsule Take 1 capsule (300 mg total) by mouth 3 (three) times daily. Titrate to up to 300 mg three times daily 270 capsule 0  . levothyroxine (SYNTHROID, LEVOTHROID) 50 MCG tablet Take 1 tablet by mouth daily.    Marland Kitchen lidocaine-prilocaine (EMLA) cream Apply to affected area once 30 g 3  . Magnesium Oxide 500 MG CAPS Take 500 mg by mouth 2 (two) times daily.     . Multiple Vitamins-Minerals (MULTIVITAMIN PO) Take 1 tablet by mouth daily.     . Omega-3 Fatty Acids (FISH OIL PO) Take by mouth.    . ondansetron (ZOFRAN) 8 MG tablet Take 1 tablet (8 mg total) by mouth 2 (two) times daily as needed for refractory nausea / vomiting. 60 tablet 2  . oxyCODONE-acetaminophen  (PERCOCET) 10-325 MG tablet Take 1 tablet by mouth every 8 (eight) hours as needed for pain. 90 tablet 0  . potassium gluconate 595 MG TABS tablet Take 595 mg by mouth daily.    . prochlorperazine (COMPAZINE) 10 MG tablet Take 1 tablet (10 mg total) by mouth every 6 (six) hours as needed (Nausea or vomiting). 60 tablet 2  . SUMAtriptan (IMITREX) 100 MG tablet take 1 tablet by mouth if needed for migraines as directed 27 tablet 1  . zolpidem (AMBIEN CR) 6.25 MG CR tablet take 1 tablet at bedtime 90 tablet 0  . albuterol (PROVENTIL HFA;VENTOLIN HFA) 108 (90 Base) MCG/ACT inhaler Inhale 2 puffs into the lungs every 6 (six) hours as needed for wheezing or shortness of breath. (Patient not taking: Reported on 12/16/2016) 1 Inhaler  2  . albuterol (PROVENTIL) (2.5 MG/3ML) 0.083% nebulizer solution Take 3 mLs (2.5 mg total) by nebulization every 6 (six) hours as needed for wheezing or shortness of breath. (Patient not taking: Reported on 12/16/2016) 75 mL 2  . atorvastatin (LIPITOR) 40 MG tablet take 1 tablet by mouth once daily (Patient not taking: Reported on 12/23/2016) 90 tablet 4  . enoxaparin (LOVENOX) 40 MG/0.4ML injection Inject 40 mg into the skin daily.    . metFORMIN (GLUCOPHAGE) 500 MG tablet Take 1 tablet (500 mg total) by mouth daily. (Patient not taking: Reported on 12/23/2016) 90 tablet 1   Current Facility-Administered Medications  Medication Dose Route Frequency Provider Last Rate Last Dose  . [START ON 01/09/2017] 0.9 %  sodium chloride infusion   Intravenous Once Lloyd Huger, MD       Facility-Administered Medications Ordered in Other Visits  Medication Dose Route Frequency Provider Last Rate Last Dose  . heparin lock flush 100 unit/mL  500 Units Intravenous Once Lloyd Huger, MD        OBJECTIVE: Vitals:   01/06/17 0911  BP: 128/76  Pulse: 84  Resp: 18  Temp: (!) 96.1 F (35.6 C)     Body mass index is 32.23 kg/m.    ECOG FS:0 - Asymptomatic  General:  Well-developed, well-nourished, no acute distress. Eyes: Pink conjunctiva, anicteric sclera. HEENT: Normocephalic, moist mucous membranes, clear oropharnyx. Lungs: Clear to auscultation bilaterally. Heart: Regular rate and rhythm. No rubs, murmurs, or gallops. Abdomen: Surgical wound nearly completely healed. Musculoskeletal: No edema, cyanosis, or clubbing. Neuro: Alert, answering all questions appropriately. Cranial nerves grossly intact. Skin: No rashes or petechiae noted. Psych: Normal affect.   LAB RESULTS:  Lab Results  Component Value Date   NA 140 01/06/2017   K 4.0 01/06/2017   CL 104 01/06/2017   CO2 28 01/06/2017   GLUCOSE 138 (H) 01/06/2017   BUN 12 01/06/2017   CREATININE 1.05 (H) 01/06/2017   CALCIUM 8.9 01/06/2017   PROT 6.4 (L) 01/06/2017   ALBUMIN 3.4 (L) 01/06/2017   AST 32 01/06/2017   ALT 28 01/06/2017   ALKPHOS 112 01/06/2017   BILITOT 0.4 01/06/2017   GFRNONAA 56 (L) 01/06/2017   GFRAA >60 01/06/2017    Lab Results  Component Value Date   WBC 6.5 01/06/2017   NEUTROABS 4.0 01/06/2017   HGB 11.1 (L) 01/06/2017   HCT 32.4 (L) 01/06/2017   MCV 90.3 01/06/2017   PLT 408 01/06/2017     STUDIES: No results found.  ASSESSMENT: Stage IIIc high-grade serous ovarian carcinoma  PLAN:  1. Stage IIIc high-grade serous ovarian carcinoma:  She underwent debulking surgery at [facility] on November 19, 2016. She was also initiated on a clinical trial and has received preoperative infusion of Keytruda on November 11, 2016. Proceed with cycle 2 of 6 of adjuvant carboplatin and Taxol today. Continue with Neulasta support. Patient's CA-125 has decreased significantly postoperatively and is now 266.7, today's result is pending. Return to clinic on Thursday for IV fluids and then in 3 weeks for consideration of cycle 2.  2. Wound healing: Significantly improved. Proceed with treatment as above.  3. Leukocytosis: Resolved. Secondary to Neulasta. 4. Pain:  Patient on chronic narcotics from previous back surgeries. Talked at length with the patient and it was agreed upon that oncology would manage her pain while she is on chemotherapy, but then once chemotherapy is completed, will refer her pain management back to her primary care physician. Continue fentanyl patch,  Percocet, and Flexeril.  5. Diarrhea: Resolved. IV fluids on Thursday. Continue Imodium as needed. 6. Hypokalemia: Resolved. 7. Anxiety: Patient reported increased anxiety for 24-48 hrs. after treatment, but has asked to keep dexamethasone in her treatment plan secondary to her joint pain and swelling. 8. Joint pain and swelling: Dexamethasone as above. Patient was also instructed should she can take ibuprofen sparingly.   Patient expressed understanding and was in agreement with this plan. She also understands that She can call clinic at any time with any questions, concerns, or complaints.   Cancer Staging Ovarian cancer, unspecified laterality ([place]) Staging form: Ovary, Fallopian Tube, and Primary Peritoneal Carcinoma, AJCC 8th Edition - Clinical: No stage assigned - Unsigned   Lloyd Huger, MD   01/06/2017 9:48 AM

## 2017-01-05 ENCOUNTER — Other Ambulatory Visit: Payer: Self-pay | Admitting: Family Medicine

## 2017-01-05 DIAGNOSIS — G43009 Migraine without aura, not intractable, without status migrainosus: Secondary | ICD-10-CM

## 2017-01-06 ENCOUNTER — Inpatient Hospital Stay: Payer: PPO

## 2017-01-06 ENCOUNTER — Other Ambulatory Visit: Payer: Self-pay | Admitting: Oncology

## 2017-01-06 ENCOUNTER — Inpatient Hospital Stay (HOSPITAL_BASED_OUTPATIENT_CLINIC_OR_DEPARTMENT_OTHER): Payer: PPO | Admitting: Oncology

## 2017-01-06 VITALS — BP 128/80 | HR 88 | Temp 98.0°F | Resp 16

## 2017-01-06 VITALS — BP 128/76 | HR 84 | Temp 96.1°F | Resp 18 | Wt 176.2 lb

## 2017-01-06 DIAGNOSIS — C7960 Secondary malignant neoplasm of unspecified ovary: Secondary | ICD-10-CM

## 2017-01-06 DIAGNOSIS — F419 Anxiety disorder, unspecified: Secondary | ICD-10-CM | POA: Diagnosis not present

## 2017-01-06 DIAGNOSIS — C569 Malignant neoplasm of unspecified ovary: Secondary | ICD-10-CM

## 2017-01-06 DIAGNOSIS — Z79899 Other long term (current) drug therapy: Secondary | ICD-10-CM

## 2017-01-06 DIAGNOSIS — Z5111 Encounter for antineoplastic chemotherapy: Secondary | ICD-10-CM | POA: Diagnosis not present

## 2017-01-06 LAB — CBC WITH DIFFERENTIAL/PLATELET
Basophils Absolute: 0.1 10*3/uL (ref 0–0.1)
Basophils Relative: 1 %
EOS PCT: 2 %
Eosinophils Absolute: 0.1 10*3/uL (ref 0–0.7)
HCT: 32.4 % — ABNORMAL LOW (ref 35.0–47.0)
HEMOGLOBIN: 11.1 g/dL — AB (ref 12.0–16.0)
LYMPHS ABS: 1.6 10*3/uL (ref 1.0–3.6)
LYMPHS PCT: 25 %
MCH: 30.8 pg (ref 26.0–34.0)
MCHC: 34.1 g/dL (ref 32.0–36.0)
MCV: 90.3 fL (ref 80.0–100.0)
Monocytes Absolute: 0.6 10*3/uL (ref 0.2–0.9)
Monocytes Relative: 10 %
Neutro Abs: 4 10*3/uL (ref 1.4–6.5)
Neutrophils Relative %: 62 %
PLATELETS: 408 10*3/uL (ref 150–440)
RBC: 3.59 MIL/uL — AB (ref 3.80–5.20)
RDW: 13.5 % (ref 11.5–14.5)
WBC: 6.5 10*3/uL (ref 3.6–11.0)

## 2017-01-06 LAB — COMPREHENSIVE METABOLIC PANEL
ALK PHOS: 112 U/L (ref 38–126)
ALT: 28 U/L (ref 14–54)
AST: 32 U/L (ref 15–41)
Albumin: 3.4 g/dL — ABNORMAL LOW (ref 3.5–5.0)
Anion gap: 8 (ref 5–15)
BUN: 12 mg/dL (ref 6–20)
CO2: 28 mmol/L (ref 22–32)
Calcium: 8.9 mg/dL (ref 8.9–10.3)
Chloride: 104 mmol/L (ref 101–111)
Creatinine, Ser: 1.05 mg/dL — ABNORMAL HIGH (ref 0.44–1.00)
GFR, EST NON AFRICAN AMERICAN: 56 mL/min — AB (ref >60)
GLUCOSE: 138 mg/dL — AB (ref 65–99)
Potassium: 4 mmol/L (ref 3.5–5.1)
Sodium: 140 mmol/L (ref 135–145)
TOTAL PROTEIN: 6.4 g/dL — AB (ref 6.5–8.1)
Total Bilirubin: 0.4 mg/dL (ref 0.3–1.2)

## 2017-01-06 MED ORDER — DEXAMETHASONE SODIUM PHOSPHATE 10 MG/ML IJ SOLN
10.0000 mg | Freq: Once | INTRAMUSCULAR | Status: AC
  Administered 2017-01-06: 10 mg via INTRAVENOUS
  Filled 2017-01-06: qty 1

## 2017-01-06 MED ORDER — SODIUM CHLORIDE 0.9 % IV SOLN
Freq: Once | INTRAVENOUS | Status: DC
  Filled 2017-01-06: qty 1000

## 2017-01-06 MED ORDER — SODIUM CHLORIDE 0.9 % IV SOLN
575.4000 mg | Freq: Once | INTRAVENOUS | Status: AC
  Administered 2017-01-06: 580 mg via INTRAVENOUS
  Filled 2017-01-06: qty 58

## 2017-01-06 MED ORDER — HEPARIN SOD (PORK) LOCK FLUSH 100 UNIT/ML IV SOLN
500.0000 [IU] | Freq: Once | INTRAVENOUS | Status: AC
  Administered 2017-01-06: 500 [IU] via INTRAVENOUS
  Filled 2017-01-06: qty 5

## 2017-01-06 MED ORDER — FAMOTIDINE IN NACL 20-0.9 MG/50ML-% IV SOLN
20.0000 mg | Freq: Once | INTRAVENOUS | Status: AC
  Administered 2017-01-06: 20 mg via INTRAVENOUS
  Filled 2017-01-06: qty 50

## 2017-01-06 MED ORDER — SODIUM CHLORIDE 0.9 % IV SOLN
175.0000 mg/m2 | Freq: Once | INTRAVENOUS | Status: AC
  Administered 2017-01-06: 330 mg via INTRAVENOUS
  Filled 2017-01-06: qty 55

## 2017-01-06 MED ORDER — PEGFILGRASTIM 6 MG/0.6ML ~~LOC~~ PSKT
6.0000 mg | PREFILLED_SYRINGE | Freq: Once | SUBCUTANEOUS | Status: AC
  Administered 2017-01-06: 6 mg via SUBCUTANEOUS
  Filled 2017-01-06: qty 0.6

## 2017-01-06 MED ORDER — CYCLOBENZAPRINE HCL 10 MG PO TABS: 10.0000 mg | ORAL_TABLET | Freq: Three times a day (TID) | ORAL | 0 refills | Status: DC | PRN

## 2017-01-06 MED ORDER — PALONOSETRON HCL INJECTION 0.25 MG/5ML
0.2500 mg | Freq: Once | INTRAVENOUS | Status: AC
Start: 2017-01-06 — End: 2017-01-06
  Administered 2017-01-06: 0.25 mg via INTRAVENOUS
  Filled 2017-01-06: qty 5

## 2017-01-06 MED ORDER — DIPHENHYDRAMINE HCL 50 MG/ML IJ SOLN
25.0000 mg | Freq: Once | INTRAMUSCULAR | Status: AC
  Administered 2017-01-06: 50 mg via INTRAVENOUS
  Filled 2017-01-06: qty 1

## 2017-01-06 MED ORDER — SODIUM CHLORIDE 0.9 % IV SOLN: 10.0000 mg | Freq: Once | INTRAVENOUS | Status: DC

## 2017-01-06 MED ORDER — SODIUM CHLORIDE 0.9% FLUSH
10.0000 mL | Freq: Once | INTRAVENOUS | Status: AC
  Administered 2017-01-06: 10 mL via INTRAVENOUS
  Filled 2017-01-06: qty 10

## 2017-01-06 MED ORDER — LORAZEPAM 2 MG/ML IJ SOLN
0.5000 mg | Freq: Once | INTRAMUSCULAR | Status: AC
  Administered 2017-01-06: 10:00:00 via INTRAVENOUS
  Filled 2017-01-06: qty 1

## 2017-01-07 LAB — CA 125: CANCER ANTIGEN (CA) 125: 122 U/mL — AB (ref 0.0–38.1)

## 2017-01-09 ENCOUNTER — Inpatient Hospital Stay: Payer: PPO

## 2017-01-09 VITALS — BP 133/79 | HR 102 | Temp 97.0°F | Resp 18

## 2017-01-09 DIAGNOSIS — E860 Dehydration: Secondary | ICD-10-CM

## 2017-01-09 DIAGNOSIS — Z5111 Encounter for antineoplastic chemotherapy: Secondary | ICD-10-CM | POA: Diagnosis not present

## 2017-01-09 MED ORDER — DEXAMETHASONE SODIUM PHOSPHATE 10 MG/ML IJ SOLN
10.0000 mg | Freq: Once | INTRAMUSCULAR | Status: AC
  Administered 2017-01-09: 10 mg via INTRAVENOUS
  Filled 2017-01-09: qty 1

## 2017-01-09 MED ORDER — HEPARIN SOD (PORK) LOCK FLUSH 100 UNIT/ML IV SOLN
500.0000 [IU] | Freq: Once | INTRAVENOUS | Status: AC
  Administered 2017-01-09: 500 [IU] via INTRAVENOUS
  Filled 2017-01-09: qty 5

## 2017-01-09 MED ORDER — SODIUM CHLORIDE 0.9 % IV SOLN: 10.0000 mg | Freq: Once | INTRAVENOUS | Status: DC

## 2017-01-09 MED ORDER — SODIUM CHLORIDE 0.9 % IV SOLN
Freq: Once | INTRAVENOUS | Status: AC
  Administered 2017-01-09: 10:00:00 via INTRAVENOUS
  Filled 2017-01-09: qty 1000

## 2017-01-15 DIAGNOSIS — M9903 Segmental and somatic dysfunction of lumbar region: Secondary | ICD-10-CM | POA: Diagnosis not present

## 2017-01-15 DIAGNOSIS — M7918 Myalgia, other site: Secondary | ICD-10-CM | POA: Diagnosis not present

## 2017-01-15 DIAGNOSIS — M9904 Segmental and somatic dysfunction of sacral region: Secondary | ICD-10-CM | POA: Diagnosis not present

## 2017-01-15 DIAGNOSIS — M545 Low back pain: Secondary | ICD-10-CM | POA: Diagnosis not present

## 2017-01-23 ENCOUNTER — Other Ambulatory Visit: Payer: Self-pay | Admitting: Family Medicine

## 2017-01-23 DIAGNOSIS — G4700 Insomnia, unspecified: Secondary | ICD-10-CM

## 2017-01-23 NOTE — Telephone Encounter (Signed)
Copied from [place] #[id number]. Topic: Quick Communication - See Telephone Encounter >> Jan 23, 2017  1:45 PM Robina Ade, Helene Kelp D wrote: CRM for notification. See Telephone encounter for: 01/23/17. Patient needs refill on her zolpidem and oxyCODONE. She asked if someone would call her when they are ready for pick up. Also when she got her oxyCODONE they only gave her 79 pills and not 90.

## 2017-01-24 MED ORDER — ZOLPIDEM TARTRATE ER 6.25 MG PO TBCR: 6.2500 mg | EXTENDED_RELEASE_TABLET | Freq: Every day | ORAL | 0 refills | Status: DC

## 2017-01-24 NOTE — Telephone Encounter (Signed)
Patient notified. Please sign off on Ambien prescription so it will be sent to [place] [place] today for patient. Thanks.

## 2017-01-24 NOTE — Telephone Encounter (Signed)
Dr. Grayland Ormond is prescribing her pain medication until done with cancer treatment.  I will send refill of Ambien

## 2017-01-26 NOTE — Progress Notes (Signed)
[place]  Telephone:(336) [phone] Fax:(336) [phone]  ID: Jessica Burgess OB: 02-12-1956  MR#: [id number]  IRW#:[id number]  Patient Care Team: Steele Sizer, MD as PCP - General (Family Medicine) Clent Jacks, RN as Registered Nurse  CHIEF COMPLAINT: Stage IIIc high-grade serous ovarian carcinoma  INTERVAL HISTORY: Patient returns to clinic today for further evaluation and consideration of cycle 3 of 6 of carboplatinum and Taxol.  She had some increased pain at the site of her port today.  She is having worsening neuropathy particularly in 2 fingers in her right hand, but otherwise feels well.  She has no other neurologic complaints. Her abdominal wound is completely healed. Her appetite has improved and she no longer has weakness or fatigue. Patient continues to be highly anxious. She denies any recent fevers. She has no chest pain or shortness of breath. She denies any nausea, vomiting, constipation, or diarrhea. She has no urinary complaints. Patient offers no further specific complaints today.  REVIEW OF SYSTEMS:   Review of Systems  Constitutional: Negative.  Negative for fever, malaise/fatigue and weight loss.  Respiratory: Negative.  Negative for cough and shortness of breath.   Cardiovascular: Negative.  Negative for chest pain and leg swelling.  Gastrointestinal: Negative.  Negative for abdominal pain, blood in stool, constipation, diarrhea, melena, nausea and vomiting.  Genitourinary: Negative.   Musculoskeletal: Positive for back pain, joint pain and neck pain.  Skin: Negative.  Negative for rash.  Neurological: Negative for sensory change and weakness.  Psychiatric/Behavioral: The patient is nervous/anxious.     As per HPI. Otherwise, a complete review of systems is negative.  PAST MEDICAL HISTORY: Past Medical History:  Diagnosis Date  . Allergic rhinitis, cause unspecified   . Anxiety state, unspecified   . Arthritis   . Asthma    only  when sick   . Backache, unspecified   . Bronchitis    hx of when get sick  . Cancer ([place])    skin cancer , basal cell   . Cervicalgia   . Complication of anesthesia   . Dermatophytosis of nail   . Dysmetabolic syndrome X   . Encounter for long-term (current) use of other medications   . Esophageal reflux   . Insomnia, unspecified   . Leukocytosis, unspecified   . Migraine without aura, without mention of intractable migraine without mention of status migrainosus   . Other and unspecified hyperlipidemia   . Other malaise and fatigue   . Overweight(278.02)   . PONV (postoperative nausea and vomiting)   . Spinal stenosis in cervical region   . Symptomatic menopausal or female climacteric states   . Unspecified disorder of skin and subcutaneous tissue   . Unspecified vitamin D deficiency     PAST SURGICAL HISTORY: Past Surgical History:  Procedure Laterality Date  . ABDOMINAL HYSTERECTOMY    . BACK SURGERY     x2 Lower   . NECK SURGERY     x3  . TONSILLECTOMY      FAMILY HISTORY: Family History  Problem Relation Age of Onset  . Depression Mother   . Migraines Mother   . Dementia Father   . Hyperlipidemia Brother   . Breast cancer Paternal Aunt        57s    ADVANCED DIRECTIVES (Y/N):  N  HEALTH MAINTENANCE: Social History   Tobacco Use  . Smoking status: Former Smoker    Packs/day: 1.50    Years: 20.00    Pack years: 30.00  Types: Cigarettes    Start date: 03/19/1979    Last attempt to quit: 08/25/1999    Years since quitting: 17.4  . Smokeless tobacco: Never Used  Substance Use Topics  . Alcohol use: Yes    Alcohol/week: 0.0 oz    Comment: wine occassionally  . Drug use: No     Colonoscopy:  PAP:  Bone density:  Lipid panel:  No Known Allergies  Current Outpatient Medications  Medication Sig Dispense Refill  . ALPRAZolam (XANAX) 0.25 MG tablet Take 1 tablet (0.25 mg total) by mouth 2 (two) times daily as needed for anxiety. 60 tablet 0  .  Ascorbic Acid (VITAMIN C PO) Take by mouth.    Marland Kitchen atorvastatin (LIPITOR) 40 MG tablet take 1 tablet by mouth once daily 90 tablet 4  . Cholecalciferol (VITAMIN D-1000 MAX ST) 1000 UNITS tablet Take 1 tablet by mouth 2 (two) times daily.    . Cyanocobalamin (B-12 PO) Take 1 tablet by mouth daily.     . cyclobenzaprine (FLEXERIL) 10 MG tablet Take 1 tablet (10 mg total) by mouth 3 (three) times daily as needed for muscle spasms. 30 tablet 0  . docusate sodium (COLACE) 100 MG capsule Take 1 capsule (100 mg total) by mouth 2 (two) times daily. 60 capsule 0  . DULoxetine (CYMBALTA) 60 MG capsule take 1 capsule by mouth once daily 90 capsule 1  . estradiol (ESTRACE) 0.5 MG tablet take 1 tablet by mouth once daily 90 tablet 4  . fentaNYL (DURAGESIC - DOSED MCG/HR) 25 MCG/HR patch Place 1 patch (25 mcg total) onto the skin every 3 (three) days. 10 patch 0  . ferrous sulfate (IRON SUPPLEMENT) 325 (65 FE) MG tablet Take 1 tablet by mouth daily.    . fluticasone (FLONASE) 50 MCG/ACT nasal spray Place 2 sprays into both nostrils as needed. 16 g 5  . gabapentin (NEURONTIN) 300 MG capsule Take 1 capsule (300 mg total) by mouth 3 (three) times daily. Titrate to up to 300 mg three times daily 270 capsule 0  . levothyroxine (SYNTHROID, LEVOTHROID) 50 MCG tablet Take 1 tablet by mouth daily.    Marland Kitchen lidocaine-prilocaine (EMLA) cream Apply to affected area once 30 g 3  . Magnesium Oxide 500 MG CAPS Take 500 mg by mouth 2 (two) times daily.     . Multiple Vitamins-Minerals (MULTIVITAMIN PO) Take 1 tablet by mouth daily.     . Omega-3 Fatty Acids (FISH OIL PO) Take by mouth.    . ondansetron (ZOFRAN) 8 MG tablet Take 1 tablet (8 mg total) by mouth 2 (two) times daily as needed for refractory nausea / vomiting. 60 tablet 2  . oxyCODONE-acetaminophen (PERCOCET) 10-325 MG tablet Take 1 tablet by mouth every 8 (eight) hours as needed for pain. 90 tablet 0  . potassium gluconate 595 MG TABS tablet Take 595 mg by mouth daily.     . prochlorperazine (COMPAZINE) 10 MG tablet Take 1 tablet (10 mg total) by mouth every 6 (six) hours as needed (Nausea or vomiting). 60 tablet 2  . SUMAtriptan (IMITREX) 100 MG tablet take 1 tablet by mouth if needed for migraines as directed 27 tablet 1  . zolpidem (AMBIEN CR) 6.25 MG CR tablet Take 1 tablet (6.25 mg total) at bedtime by mouth. 90 tablet 0  . albuterol (PROVENTIL HFA;VENTOLIN HFA) 108 (90 Base) MCG/ACT inhaler Inhale 2 puffs into the lungs every 6 (six) hours as needed for wheezing or shortness of breath. (Patient not taking: Reported on  12/16/2016) 1 Inhaler 2  . albuterol (PROVENTIL) (2.5 MG/3ML) 0.083% nebulizer solution Take 3 mLs (2.5 mg total) by nebulization every 6 (six) hours as needed for wheezing or shortness of breath. (Patient not taking: Reported on 12/16/2016) 75 mL 2  . enoxaparin (LOVENOX) 40 MG/0.4ML injection Inject 40 mg into the skin daily.    . metFORMIN (GLUCOPHAGE) 500 MG tablet Take 1 tablet (500 mg total) by mouth daily. (Patient not taking: Reported on 12/23/2016) 90 tablet 1   No current facility-administered medications for this visit.    Facility-Administered Medications Ordered in Other Visits  Medication Dose Route Frequency Provider Last Rate Last Dose  . [START ON 01/30/2017] 0.9 %  sodium chloride infusion   Intravenous Once Lloyd Huger, MD      . Derrill Memo ON 01/30/2017] dexamethasone (DECADRON) 20 mg in sodium chloride 0.9 % 50 mL IVPB  20 mg Intravenous Once Lloyd Huger, MD        OBJECTIVE: Vitals:   01/27/17 0932  BP: 135/80  Pulse: 89  Resp: 18  Temp: 97.8 F (36.6 C)     Body mass index is 31.83 kg/m.    ECOG FS:0 - Asymptomatic  General: Well-developed, well-nourished, no acute distress. Eyes: Pink conjunctiva, anicteric sclera. HEENT: Normocephalic, moist mucous membranes, clear oropharnyx. Lungs: Clear to auscultation bilaterally. Heart: Regular rate and rhythm. No rubs, murmurs, or gallops. Abdomen: Surgical  wound completely healed by secondary intention. Musculoskeletal: No edema, cyanosis, or clubbing. Neuro: Alert, answering all questions appropriately. Cranial nerves grossly intact. Skin: No rashes or petechiae noted. Psych: Normal affect.   LAB RESULTS:  Lab Results  Component Value Date   NA 138 01/27/2017   K 3.6 01/27/2017   CL 100 (L) 01/27/2017   CO2 27 01/27/2017   GLUCOSE 107 (H) 01/27/2017   BUN 11 01/27/2017   CREATININE 0.77 01/27/2017   CALCIUM 8.8 (L) 01/27/2017   PROT 6.6 01/27/2017   ALBUMIN 3.4 (L) 01/27/2017   AST 29 01/27/2017   ALT 30 01/27/2017   ALKPHOS 121 01/27/2017   BILITOT 0.4 01/27/2017   GFRNONAA >60 01/27/2017   GFRAA >60 01/27/2017    Lab Results  Component Value Date   WBC 4.6 01/27/2017   NEUTROABS 1.8 01/27/2017   HGB 11.3 (L) 01/27/2017   HCT 32.8 (L) 01/27/2017   MCV 90.1 01/27/2017   PLT 228 01/27/2017     STUDIES: No results found.  ASSESSMENT: Stage IIIc high-grade serous ovarian carcinoma  PLAN:  1. Stage IIIc high-grade serous ovarian carcinoma:  She underwent debulking surgery at [facility] on November 19, 2016. She was also initiated on a clinical trial and has received preoperative infusion of Keytruda on November 11, 2016. Proceed with cycle 3 of 6 of adjuvant carboplatin and Taxol today. Continue with Neulasta support. Patient's CA-125 has decreased significantly postoperatively and is now 122, today's result is pending. Return to clinic on Thursday for IV fluids and then in 3 weeks for consideration of cycle 4.  Will reimage after cycle 4. 2. Wound healing: Significantly improved. Proceed with treatment as above.  3. Leukocytosis: Resolved. Secondary to Neulasta. 4. Pain: Patient on chronic narcotics from previous back surgeries. Talked at length with the patient and it was agreed upon that oncology would manage her pain while she is on chemotherapy, but then once chemotherapy is completed, will refer her pain  management back to her primary care physician.  Increase fentanyl patch to 50 mcg every 72 hours.  Continue Percocet  and Flexeril as needed.   5. Diarrhea: Resolved. IV fluids on Thursday. Continue Imodium as needed. 6. Hypokalemia: Resolved. 7. Anxiety: Patient reported increased anxiety for 24-48 hrs. after treatment, but has asked to keep dexamethasone in her treatment plan secondary to her joint pain and swelling. 8. Joint pain and swelling: Dexamethasone as above. Patient was also instructed should she can take ibuprofen sparingly.   Patient expressed understanding and was in agreement with this plan. She also understands that She can call clinic at any time with any questions, concerns, or complaints.   Cancer Staging Ovarian cancer, unspecified laterality ([place]) Staging form: Ovary, Fallopian Tube, and Primary Peritoneal Carcinoma, AJCC 8th Edition - Clinical: No stage assigned - Unsigned   Lloyd Huger, MD   01/27/2017 9:55 AM

## 2017-01-27 ENCOUNTER — Inpatient Hospital Stay (HOSPITAL_BASED_OUTPATIENT_CLINIC_OR_DEPARTMENT_OTHER): Payer: PPO | Admitting: Oncology

## 2017-01-27 ENCOUNTER — Inpatient Hospital Stay: Payer: PPO

## 2017-01-27 ENCOUNTER — Inpatient Hospital Stay: Payer: PPO | Attending: Oncology

## 2017-01-27 ENCOUNTER — Other Ambulatory Visit: Payer: Self-pay | Admitting: Oncology

## 2017-01-27 VITALS — BP 135/80 | HR 89 | Temp 97.8°F | Resp 18 | Wt 174.0 lb

## 2017-01-27 DIAGNOSIS — Z7689 Persons encountering health services in other specified circumstances: Secondary | ICD-10-CM | POA: Diagnosis not present

## 2017-01-27 DIAGNOSIS — Z7984 Long term (current) use of oral hypoglycemic drugs: Secondary | ICD-10-CM | POA: Insufficient documentation

## 2017-01-27 DIAGNOSIS — K219 Gastro-esophageal reflux disease without esophagitis: Secondary | ICD-10-CM | POA: Diagnosis not present

## 2017-01-27 DIAGNOSIS — J45909 Unspecified asthma, uncomplicated: Secondary | ICD-10-CM | POA: Diagnosis not present

## 2017-01-27 DIAGNOSIS — C7960 Secondary malignant neoplasm of unspecified ovary: Secondary | ICD-10-CM

## 2017-01-27 DIAGNOSIS — Z79899 Other long term (current) drug therapy: Secondary | ICD-10-CM

## 2017-01-27 DIAGNOSIS — C569 Malignant neoplasm of unspecified ovary: Secondary | ICD-10-CM

## 2017-01-27 DIAGNOSIS — F419 Anxiety disorder, unspecified: Secondary | ICD-10-CM | POA: Insufficient documentation

## 2017-01-27 DIAGNOSIS — M542 Cervicalgia: Secondary | ICD-10-CM

## 2017-01-27 DIAGNOSIS — Z85828 Personal history of other malignant neoplasm of skin: Secondary | ICD-10-CM | POA: Insufficient documentation

## 2017-01-27 DIAGNOSIS — Z5111 Encounter for antineoplastic chemotherapy: Secondary | ICD-10-CM | POA: Diagnosis not present

## 2017-01-27 DIAGNOSIS — Z79891 Long term (current) use of opiate analgesic: Secondary | ICD-10-CM | POA: Diagnosis not present

## 2017-01-27 DIAGNOSIS — Z87891 Personal history of nicotine dependence: Secondary | ICD-10-CM | POA: Insufficient documentation

## 2017-01-27 DIAGNOSIS — E559 Vitamin D deficiency, unspecified: Secondary | ICD-10-CM | POA: Diagnosis not present

## 2017-01-27 DIAGNOSIS — M5441 Lumbago with sciatica, right side: Secondary | ICD-10-CM

## 2017-01-27 DIAGNOSIS — G8929 Other chronic pain: Secondary | ICD-10-CM

## 2017-01-27 DIAGNOSIS — E785 Hyperlipidemia, unspecified: Secondary | ICD-10-CM | POA: Diagnosis not present

## 2017-01-27 LAB — CBC WITH DIFFERENTIAL/PLATELET
BASOS PCT: 2 %
Basophils Absolute: 0.1 10*3/uL (ref 0–0.1)
EOS ABS: 0.1 10*3/uL (ref 0–0.7)
EOS PCT: 2 %
HEMATOCRIT: 32.8 % — AB (ref 35.0–47.0)
Hemoglobin: 11.3 g/dL — ABNORMAL LOW (ref 12.0–16.0)
LYMPHS ABS: 1.9 10*3/uL (ref 1.0–3.6)
LYMPHS PCT: 42 %
MCH: 31 pg (ref 26.0–34.0)
MCHC: 34.4 g/dL (ref 32.0–36.0)
MCV: 90.1 fL (ref 80.0–100.0)
MONO ABS: 0.6 10*3/uL (ref 0.2–0.9)
MONOS PCT: 14 %
Neutro Abs: 1.8 10*3/uL (ref 1.4–6.5)
Neutrophils Relative %: 40 %
Platelets: 228 10*3/uL (ref 150–440)
RBC: 3.64 MIL/uL — ABNORMAL LOW (ref 3.80–5.20)
RDW: 15.5 % — ABNORMAL HIGH (ref 11.5–14.5)
WBC: 4.6 10*3/uL (ref 3.6–11.0)

## 2017-01-27 LAB — COMPREHENSIVE METABOLIC PANEL
ALBUMIN: 3.4 g/dL — AB (ref 3.5–5.0)
ALT: 30 U/L (ref 14–54)
AST: 29 U/L (ref 15–41)
Alkaline Phosphatase: 121 U/L (ref 38–126)
Anion gap: 11 (ref 5–15)
BUN: 11 mg/dL (ref 6–20)
CHLORIDE: 100 mmol/L — AB (ref 101–111)
CO2: 27 mmol/L (ref 22–32)
CREATININE: 0.77 mg/dL (ref 0.44–1.00)
Calcium: 8.8 mg/dL — ABNORMAL LOW (ref 8.9–10.3)
GFR calc Af Amer: >60 mL/min (ref >60)
GLUCOSE: 107 mg/dL — AB (ref 65–99)
POTASSIUM: 3.6 mmol/L (ref 3.5–5.1)
Sodium: 138 mmol/L (ref 135–145)
Total Bilirubin: 0.4 mg/dL (ref 0.3–1.2)
Total Protein: 6.6 g/dL (ref 6.5–8.1)

## 2017-01-27 MED ORDER — PEGFILGRASTIM 6 MG/0.6ML ~~LOC~~ PSKT
6.0000 mg | PREFILLED_SYRINGE | Freq: Once | SUBCUTANEOUS | Status: AC
  Administered 2017-01-27: 6 mg via SUBCUTANEOUS
  Filled 2017-01-27: qty 0.6

## 2017-01-27 MED ORDER — SODIUM CHLORIDE 0.9 % IV SOLN: 10.0000 mg | Freq: Once | INTRAVENOUS | Status: DC

## 2017-01-27 MED ORDER — SODIUM CHLORIDE 0.9 % IV SOLN
Freq: Once | INTRAVENOUS | Status: AC
Start: 2017-01-27 — End: 2017-01-27
  Administered 2017-01-27: 10:00:00 via INTRAVENOUS
  Filled 2017-01-27: qty 1000

## 2017-01-27 MED ORDER — HEPARIN SOD (PORK) LOCK FLUSH 100 UNIT/ML IV SOLN
500.0000 [IU] | Freq: Once | INTRAVENOUS | Status: AC | PRN
Start: 2017-01-27 — End: 2017-01-27
  Administered 2017-01-27: 500 [IU]
  Filled 2017-01-27: qty 5

## 2017-01-27 MED ORDER — FAMOTIDINE IN NACL 20-0.9 MG/50ML-% IV SOLN
20.0000 mg | Freq: Once | INTRAVENOUS | Status: AC
  Administered 2017-01-27: 20 mg via INTRAVENOUS
  Filled 2017-01-27: qty 50

## 2017-01-27 MED ORDER — OXYCODONE-ACETAMINOPHEN 10-325 MG PO TABS: 1.0000 | ORAL_TABLET | Freq: Three times a day (TID) | ORAL | 0 refills | Status: DC | PRN

## 2017-01-27 MED ORDER — LORAZEPAM 2 MG/ML IJ SOLN
0.5000 mg | Freq: Once | INTRAMUSCULAR | Status: AC
  Administered 2017-01-27: 0.5 mg via INTRAVENOUS
  Filled 2017-01-27: qty 1

## 2017-01-27 MED ORDER — DIPHENHYDRAMINE HCL 50 MG/ML IJ SOLN
25.0000 mg | Freq: Once | INTRAMUSCULAR | Status: AC
  Administered 2017-01-27: 25 mg via INTRAVENOUS
  Filled 2017-01-27: qty 1

## 2017-01-27 MED ORDER — DEXAMETHASONE SODIUM PHOSPHATE 10 MG/ML IJ SOLN
10.0000 mg | Freq: Once | INTRAMUSCULAR | Status: AC
  Administered 2017-01-27: 10 mg via INTRAVENOUS
  Filled 2017-01-27: qty 1

## 2017-01-27 MED ORDER — SODIUM CHLORIDE 0.9 % IV SOLN
175.0000 mg/m2 | Freq: Once | INTRAVENOUS | Status: AC
  Administered 2017-01-27: 330 mg via INTRAVENOUS
  Filled 2017-01-27: qty 55

## 2017-01-27 MED ORDER — SODIUM CHLORIDE 0.9 % IV SOLN
Freq: Once | INTRAVENOUS | Status: AC
  Filled 2017-01-27: qty 1000

## 2017-01-27 MED ORDER — PALONOSETRON HCL INJECTION 0.25 MG/5ML
0.2500 mg | Freq: Once | INTRAVENOUS | Status: AC
  Administered 2017-01-27: 0.25 mg via INTRAVENOUS
  Filled 2017-01-27: qty 5

## 2017-01-27 MED ORDER — SODIUM CHLORIDE 0.9 % IV SOLN
701.4000 mg | Freq: Once | INTRAVENOUS | Status: AC
  Administered 2017-01-27: 700 mg via INTRAVENOUS
  Filled 2017-01-27: qty 70

## 2017-01-27 MED ORDER — FENTANYL 50 MCG/HR TD PT72: 50.0000 ug | MEDICATED_PATCH | TRANSDERMAL | 0 refills | Status: DC

## 2017-01-27 MED ORDER — SODIUM CHLORIDE 0.9 % IV SOLN
20.0000 mg | Freq: Once | INTRAVENOUS | Status: DC
  Filled 2017-01-27: qty 2

## 2017-01-27 NOTE — Progress Notes (Signed)
Pt in today for follow up with md, labs and chemo.  Pt reports having increased pain in Port of cath located r chest.  Pt also reports pain a 4-6 at present and unbearable mostly throughout the day, wearing duragesic patch and using oxycodone more often than every 8 hours. States pain is located in lower back.

## 2017-01-28 LAB — CA 125: CANCER ANTIGEN (CA) 125: 108.3 U/mL — AB (ref 0.0–38.1)

## 2017-01-30 ENCOUNTER — Inpatient Hospital Stay: Payer: PPO

## 2017-01-30 VITALS — BP 125/76 | HR 93 | Temp 97.8°F | Resp 18

## 2017-01-30 DIAGNOSIS — Z5111 Encounter for antineoplastic chemotherapy: Secondary | ICD-10-CM | POA: Diagnosis not present

## 2017-01-30 DIAGNOSIS — E860 Dehydration: Secondary | ICD-10-CM

## 2017-01-30 DIAGNOSIS — Z95828 Presence of other vascular implants and grafts: Secondary | ICD-10-CM

## 2017-01-30 MED ORDER — HEPARIN SOD (PORK) LOCK FLUSH 100 UNIT/ML IV SOLN
500.0000 [IU] | Freq: Once | INTRAVENOUS | Status: AC
  Administered 2017-01-30: 500 [IU] via INTRAVENOUS

## 2017-01-30 MED ORDER — SODIUM CHLORIDE 0.9 % IV SOLN: 10.0000 mg | Freq: Once | INTRAVENOUS | Status: DC

## 2017-01-30 MED ORDER — SODIUM CHLORIDE 0.9 % IV SOLN
Freq: Once | INTRAVENOUS | Status: AC
  Administered 2017-01-30: 15:00:00 via INTRAVENOUS
  Filled 2017-01-30: qty 1000

## 2017-01-30 MED ORDER — DEXAMETHASONE SODIUM PHOSPHATE 10 MG/ML IJ SOLN
10.0000 mg | Freq: Once | INTRAMUSCULAR | Status: AC
  Administered 2017-01-30: 10 mg via INTRAVENOUS
  Filled 2017-01-30: qty 1

## 2017-02-05 DIAGNOSIS — T1490XA Injury, unspecified, initial encounter: Secondary | ICD-10-CM | POA: Diagnosis not present

## 2017-02-16 NOTE — Progress Notes (Signed)
[facility]  [phone] [phone]  ID: [name] OB: 1955/09/29  MR#: [id number]  MLY#:[id number]  Patient Care Team: Steele Sizer, MD as PCP - General (Family Medicine) Clent Jacks, RN as Registered Nurse  CHIEF COMPLAINT: Stage IIIc high-grade serous ovarian carcinoma  INTERVAL HISTORY: Patient returns to clinic today for further evaluation and consideration of cycle 4 of 6 of carboplatinum and Taxol.  Patient was recently in the [place], was diagnosed with pneumonia and placed on Levaquin.  She continues to have a mildly productive cough, but her symptoms are significantly improved.  She otherwise feels well.  She does not complain of peripheral neuropathy today.  Her abdominal wound is completely healed. Her appetite has improved and she no longer has weakness or fatigue. She denies any recent fevers. She has no chest pain or shortness of breath. She denies any nausea, vomiting, constipation, or diarrhea. She has no urinary complaints. Patient offers no further specific complaints today.  REVIEW OF SYSTEMS:   Review of Systems  Constitutional: Negative.  Negative for fever, malaise/fatigue and weight loss.  Respiratory: Positive for cough. Negative for shortness of breath.   Cardiovascular: Negative.  Negative for chest pain and leg swelling.  Gastrointestinal: Negative.  Negative for abdominal pain, blood in stool, constipation, diarrhea, melena, nausea and vomiting.  Genitourinary: Negative.   Musculoskeletal: Positive for back pain, joint pain and neck pain.  Skin: Negative.  Negative for rash.  Neurological: Negative.  Negative for sensory change and weakness.  Psychiatric/Behavioral: The patient is nervous/anxious.     As per HPI. Otherwise, a complete review of systems is negative.  PAST MEDICAL HISTORY: Past Medical History:  Diagnosis Date  . Allergic rhinitis, cause unspecified   . Anxiety state,  unspecified   . Arthritis   . Asthma    only when sick   . Backache, unspecified   . Bronchitis    hx of when get sick  . Cancer ([place])    skin cancer , basal cell   . Cervicalgia   . Complication of anesthesia   . Dermatophytosis of nail   . Dysmetabolic syndrome X   . Encounter for long-term (current) use of other medications   . Esophageal reflux   . Insomnia, unspecified   . Leukocytosis, unspecified   . Migraine without aura, without mention of intractable migraine without mention of status migrainosus   . Other and unspecified hyperlipidemia   . Other malaise and fatigue   . Overweight(278.02)   . PONV (postoperative nausea and vomiting)   . Spinal stenosis in cervical region   . Symptomatic menopausal or female climacteric states   . Unspecified disorder of skin and subcutaneous tissue   . Unspecified vitamin D deficiency     PAST SURGICAL HISTORY: Past Surgical History:  Procedure Laterality Date  . ABDOMINAL HYSTERECTOMY    . ANTERIOR CERVICAL DECOMP/DISCECTOMY FUSION N/A 06/07/2015   Procedure: Cervical three - four and Cervical six- seven anterior cervical decompression with fusion interbody prosthesis plating and bonegraft;  Surgeon: Newman Pies, MD;  Location: [facility];  Service: Neurosurgery;  Laterality: N/A;  C34 and C67 anterior cervical decompression with fusion interbody prosthesis plating and bonegraft  . BACK SURGERY     x2 Lower   . EVACUATION OF CERVICAL HEMATOMA N/A 06/14/2015   Procedure: EVACUATION OF CERVICAL HEMATOMA;  Surgeon: Newman Pies, MD;  Location: [facility];  Service: Neurosurgery;  Laterality: N/A;  . NECK SURGERY  x3  . TONSILLECTOMY      FAMILY HISTORY: Family History  Problem Relation Age of Onset  . Depression Mother   . Migraines Mother   . Dementia Father   . Hyperlipidemia Brother   . Breast cancer Paternal Aunt        37s    ADVANCED DIRECTIVES (Y/N):  N  HEALTH MAINTENANCE: Social History   Tobacco  Use  . Smoking status: Former Smoker    Packs/day: 1.50    Years: 20.00    Pack years: 30.00    Types: Cigarettes    Start date: 03/19/1979    Last attempt to quit: 08/25/1999    Years since quitting: 17.4  . Smokeless tobacco: Never Used  Substance Use Topics  . Alcohol use: Yes    Alcohol/week: 0.0 oz    Comment: wine occassionally  . Drug use: No     Colonoscopy:  PAP:  Bone density:  Lipid panel:  No Known Allergies  Current Outpatient Medications  Medication Sig Dispense Refill  . albuterol (PROVENTIL HFA;VENTOLIN HFA) 108 (90 Base) MCG/ACT inhaler Inhale 2 puffs into the lungs every 6 (six) hours as needed for wheezing or shortness of breath. 1 Inhaler 2  . albuterol (PROVENTIL) (2.5 MG/3ML) 0.083% nebulizer solution Take 3 mLs (2.5 mg total) by nebulization every 6 (six) hours as needed for wheezing or shortness of breath. 75 mL 2  . ALPRAZolam (XANAX) 0.25 MG tablet Take 1 tablet (0.25 mg total) by mouth 2 (two) times daily as needed for anxiety. 60 tablet 0  . Ascorbic Acid (VITAMIN C PO) Take by mouth.    Marland Kitchen atorvastatin (LIPITOR) 40 MG tablet take 1 tablet by mouth once daily 90 tablet 4  . budesonide-formoterol (SYMBICORT) 160-4.5 MCG/ACT inhaler Inhale 2 puffs into the lungs 2 (two) times daily.    . Cholecalciferol (VITAMIN D-1000 MAX ST) 1000 UNITS tablet Take 1 tablet by mouth 2 (two) times daily.    . Cyanocobalamin (B-12 PO) Take 1 tablet by mouth daily.     . cyclobenzaprine (FLEXERIL) 10 MG tablet Take 1 tablet (10 mg total) by mouth 3 (three) times daily as needed for muscle spasms. 30 tablet 0  . docusate sodium (COLACE) 100 MG capsule Take 1 capsule (100 mg total) by mouth 2 (two) times daily. 60 capsule 0  . DULoxetine (CYMBALTA) 60 MG capsule take 1 capsule by mouth once daily 90 capsule 1  . enoxaparin (LOVENOX) 40 MG/0.4ML injection Inject 40 mg into the skin daily.    Marland Kitchen estradiol (ESTRACE) 0.5 MG tablet take 1 tablet by mouth once daily 90 tablet 4  .  fentaNYL (DURAGESIC - DOSED MCG/HR) 25 MCG/HR patch Place 25 mcg onto the skin every 3 (three) days.  0  . fentaNYL (DURAGESIC - DOSED MCG/HR) 50 MCG/HR Place 1 patch (50 mcg total) every 3 (three) days onto the skin. 10 patch 0  . ferrous sulfate (IRON SUPPLEMENT) 325 (65 FE) MG tablet Take 1 tablet by mouth daily.    . fluticasone (FLONASE) 50 MCG/ACT nasal spray Place 2 sprays into both nostrils as needed. 16 g 5  . gabapentin (NEURONTIN) 300 MG capsule Take 1 capsule (300 mg total) by mouth 3 (three) times daily. Titrate to up to 300 mg three times daily 270 capsule 0  . ipratropium (ATROVENT HFA) 17 MCG/ACT inhaler Inhale 2 puffs into the lungs every 6 (six) hours.    Marland Kitchen levofloxacin (LEVAQUIN) 500 MG tablet Take 500 mg by mouth daily.    Marland Kitchen  levothyroxine (SYNTHROID, LEVOTHROID) 50 MCG tablet Take 1 tablet by mouth daily.    Marland Kitchen lidocaine-prilocaine (EMLA) cream Apply to affected area once 30 g 3  . Magnesium Oxide 500 MG CAPS Take 500 mg by mouth 2 (two) times daily.     . metFORMIN (GLUCOPHAGE) 500 MG tablet Take 1 tablet (500 mg total) by mouth daily. 90 tablet 1  . montelukast (SINGULAIR) 10 MG tablet Take 10 mg by mouth at bedtime.    . Multiple Vitamins-Minerals (MULTIVITAMIN PO) Take 1 tablet by mouth daily.     . Omega-3 Fatty Acids (FISH OIL PO) Take by mouth.    . ondansetron (ZOFRAN) 8 MG tablet Take 1 tablet (8 mg total) by mouth 2 (two) times daily as needed for refractory nausea / vomiting. 60 tablet 2  . oxyCODONE-acetaminophen (PERCOCET) 10-325 MG tablet Take 1 tablet every 8 (eight) hours as needed by mouth for pain. 90 tablet 0  . Polyethylene Glycol 3350 (PEG 3350) POWD Take by mouth.    . potassium gluconate 595 MG TABS tablet Take 595 mg by mouth daily.    . prochlorperazine (COMPAZINE) 10 MG tablet Take 1 tablet (10 mg total) by mouth every 6 (six) hours as needed (Nausea or vomiting). 60 tablet 2  . SUMAtriptan (IMITREX) 100 MG tablet take 1 tablet by mouth if needed for  migraines as directed 27 tablet 1  . zolpidem (AMBIEN CR) 6.25 MG CR tablet Take 1 tablet (6.25 mg total) at bedtime by mouth. 90 tablet 0   No current facility-administered medications for this visit.    Facility-Administered Medications Ordered in Other Visits  Medication Dose Route Frequency Provider Last Rate Last Dose  . 0.9 %  sodium chloride infusion   Intravenous Once Lloyd Huger, MD      . dexamethasone (DECADRON) 20 mg in sodium chloride 0.9 % 50 mL IVPB  20 mg Intravenous Once Lloyd Huger, MD      . heparin lock flush 100 unit/mL  500 Units Intravenous Once Lloyd Huger, MD        OBJECTIVE: Vitals:   02/17/17 0946  BP: 120/68  Pulse: 98  Temp: (!) 97 F (36.1 C)     Body mass index is 32.04 kg/m.    ECOG FS:0 - Asymptomatic  General: Well-developed, well-nourished, no acute distress. Eyes: Pink conjunctiva, anicteric sclera. HEENT: Normocephalic, moist mucous membranes, clear oropharnyx. Lungs: Rare scattered wheezing, otherwise clear bilaterally. Heart: Regular rate and rhythm. No rubs, murmurs, or gallops. Abdomen: Surgical wound completely healed by secondary intention. Musculoskeletal: No edema, cyanosis, or clubbing. Neuro: Alert, answering all questions appropriately. Cranial nerves grossly intact. Skin: No rashes or petechiae noted. Psych: Normal affect.   LAB RESULTS:  Lab Results  Component Value Date   NA 139 02/17/2017   K 3.7 02/17/2017   CL 101 02/17/2017   CO2 27 02/17/2017   GLUCOSE 155 (H) 02/17/2017   BUN 9 02/17/2017   CREATININE 0.90 02/17/2017   CALCIUM 8.6 (L) 02/17/2017   PROT 6.5 02/17/2017   ALBUMIN 3.3 (L) 02/17/2017   AST 29 02/17/2017   ALT 18 02/17/2017   ALKPHOS 98 02/17/2017   BILITOT 0.4 02/17/2017   GFRNONAA >60 02/17/2017   GFRAA >60 02/17/2017    Lab Results  Component Value Date   WBC 6.7 02/17/2017   NEUTROABS 4.2 02/17/2017   HGB 10.8 (L) 02/17/2017   HCT 31.2 (L) 02/17/2017   MCV  91.9 02/17/2017   PLT 301 02/17/2017  STUDIES: No results found.  ASSESSMENT: Stage IIIc high-grade serous ovarian carcinoma  PLAN:  1. Stage IIIc high-grade serous ovarian carcinoma:  She underwent debulking surgery at [facility] on November 19, 2016. She was also initiated on a clinical trial and has received preoperative infusion of Keytruda on November 11, 2016. Proceed with cycle 4 of 6 of adjuvant carboplatin and Taxol today. Continue with Neulasta support. Patient's CA-125 has decreased significantly postoperatively and is now 108, today's result is pending. Return to clinic in 3 weeks for consideration of cycle 4.  Will reimage with CT scan 1-2 days prior to her next infusion.  2. Wound healing: Resolved. Proceed with treatment as above.  3. Leukocytosis: Resolved. Secondary to Neulasta. 4. Pain: Patient on chronic narcotics from previous back surgeries. Talked at length with the patient and it was agreed upon that oncology would manage her pain while she is on chemotherapy, but then once chemotherapy is completed, will refer her pain management back to her primary care physician.  Increase fentanyl patch to 50 mcg every 72 hours.  Continue Percocet and Flexeril as needed.   5. Diarrhea: Resolved. Continue Imodium as needed. 6. Hypokalemia: Resolved. 7. Anxiety: Patient reported increased anxiety for 24-48 hrs. after treatment, but has asked to keep dexamethasone in her treatment plan secondary to her joint pain and swelling. 8. Joint pain and swelling: Dexamethasone as above. Patient was also instructed should she can take ibuprofen sparingly. 9.  Pneumonia: Continue Levaquin as prescribed. 10.  Lower extremity edema: Continue compression stockings during the day and elevation at night.   Patient expressed understanding and was in agreement with this plan. She also understands that She can call clinic at any time with any questions, concerns, or complaints.   Cancer  Staging Ovarian cancer, unspecified laterality ([place]) Staging form: Ovary, Fallopian Tube, and Primary Peritoneal Carcinoma, AJCC 8th Edition - Clinical: No stage assigned - Unsigned   Lloyd Huger, MD   02/17/2017 10:06 AM

## 2017-02-17 ENCOUNTER — Inpatient Hospital Stay: Payer: PPO

## 2017-02-17 ENCOUNTER — Encounter: Payer: Self-pay | Admitting: Oncology

## 2017-02-17 ENCOUNTER — Inpatient Hospital Stay: Payer: PPO | Attending: Oncology | Admitting: Oncology

## 2017-02-17 ENCOUNTER — Other Ambulatory Visit: Payer: Self-pay

## 2017-02-17 VITALS — BP 120/68 | HR 98 | Temp 97.0°F | Wt 175.2 lb

## 2017-02-17 DIAGNOSIS — Z7689 Persons encountering health services in other specified circumstances: Secondary | ICD-10-CM | POA: Diagnosis not present

## 2017-02-17 DIAGNOSIS — Z87891 Personal history of nicotine dependence: Secondary | ICD-10-CM | POA: Diagnosis not present

## 2017-02-17 DIAGNOSIS — Z85828 Personal history of other malignant neoplasm of skin: Secondary | ICD-10-CM | POA: Insufficient documentation

## 2017-02-17 DIAGNOSIS — C7960 Secondary malignant neoplasm of unspecified ovary: Secondary | ICD-10-CM

## 2017-02-17 DIAGNOSIS — K219 Gastro-esophageal reflux disease without esophagitis: Secondary | ICD-10-CM | POA: Diagnosis not present

## 2017-02-17 DIAGNOSIS — D696 Thrombocytopenia, unspecified: Secondary | ICD-10-CM | POA: Insufficient documentation

## 2017-02-17 DIAGNOSIS — Z5111 Encounter for antineoplastic chemotherapy: Secondary | ICD-10-CM | POA: Insufficient documentation

## 2017-02-17 DIAGNOSIS — K5900 Constipation, unspecified: Secondary | ICD-10-CM | POA: Diagnosis not present

## 2017-02-17 DIAGNOSIS — F419 Anxiety disorder, unspecified: Secondary | ICD-10-CM | POA: Diagnosis not present

## 2017-02-17 DIAGNOSIS — R600 Localized edema: Secondary | ICD-10-CM | POA: Diagnosis not present

## 2017-02-17 DIAGNOSIS — C569 Malignant neoplasm of unspecified ovary: Secondary | ICD-10-CM | POA: Insufficient documentation

## 2017-02-17 DIAGNOSIS — J189 Pneumonia, unspecified organism: Secondary | ICD-10-CM | POA: Diagnosis not present

## 2017-02-17 DIAGNOSIS — J45909 Unspecified asthma, uncomplicated: Secondary | ICD-10-CM | POA: Insufficient documentation

## 2017-02-17 DIAGNOSIS — E559 Vitamin D deficiency, unspecified: Secondary | ICD-10-CM | POA: Diagnosis not present

## 2017-02-17 DIAGNOSIS — Z79899 Other long term (current) drug therapy: Secondary | ICD-10-CM | POA: Diagnosis not present

## 2017-02-17 DIAGNOSIS — E785 Hyperlipidemia, unspecified: Secondary | ICD-10-CM | POA: Insufficient documentation

## 2017-02-17 DIAGNOSIS — Z90710 Acquired absence of both cervix and uterus: Secondary | ICD-10-CM | POA: Diagnosis not present

## 2017-02-17 DIAGNOSIS — G629 Polyneuropathy, unspecified: Secondary | ICD-10-CM | POA: Insufficient documentation

## 2017-02-17 LAB — COMPREHENSIVE METABOLIC PANEL
ALBUMIN: 3.3 g/dL — AB (ref 3.5–5.0)
ALK PHOS: 98 U/L (ref 38–126)
ALT: 18 U/L (ref 14–54)
AST: 29 U/L (ref 15–41)
Anion gap: 11 (ref 5–15)
BUN: 9 mg/dL (ref 6–20)
CALCIUM: 8.6 mg/dL — AB (ref 8.9–10.3)
CO2: 27 mmol/L (ref 22–32)
CREATININE: 0.9 mg/dL (ref 0.44–1.00)
Chloride: 101 mmol/L (ref 101–111)
GFR calc Af Amer: >60 mL/min (ref >60)
GFR calc non Af Amer: >60 mL/min (ref >60)
GLUCOSE: 155 mg/dL — AB (ref 65–99)
Potassium: 3.7 mmol/L (ref 3.5–5.1)
SODIUM: 139 mmol/L (ref 135–145)
Total Bilirubin: 0.4 mg/dL (ref 0.3–1.2)
Total Protein: 6.5 g/dL (ref 6.5–8.1)

## 2017-02-17 LAB — CBC WITH DIFFERENTIAL/PLATELET
BASOS PCT: 1 %
Basophils Absolute: 0.1 10*3/uL (ref 0–0.1)
EOS ABS: 0.1 10*3/uL (ref 0–0.7)
Eosinophils Relative: 1 %
HCT: 31.2 % — ABNORMAL LOW (ref 35.0–47.0)
Hemoglobin: 10.8 g/dL — ABNORMAL LOW (ref 12.0–16.0)
Lymphocytes Relative: 25 %
Lymphs Abs: 1.7 10*3/uL (ref 1.0–3.6)
MCH: 31.8 pg (ref 26.0–34.0)
MCHC: 34.6 g/dL (ref 32.0–36.0)
MCV: 91.9 fL (ref 80.0–100.0)
MONO ABS: 0.7 10*3/uL (ref 0.2–0.9)
MONOS PCT: 10 %
NEUTROS ABS: 4.2 10*3/uL (ref 1.4–6.5)
Neutrophils Relative %: 63 %
Platelets: 301 10*3/uL (ref 150–440)
RBC: 3.4 MIL/uL — ABNORMAL LOW (ref 3.80–5.20)
RDW: 16.5 % — AB (ref 11.5–14.5)
WBC: 6.7 10*3/uL (ref 3.6–11.0)

## 2017-02-17 MED ORDER — LORAZEPAM 2 MG/ML IJ SOLN
0.5000 mg | Freq: Once | INTRAMUSCULAR | Status: AC
  Administered 2017-02-17: 0.5 mg via INTRAVENOUS
  Filled 2017-02-17: qty 1

## 2017-02-17 MED ORDER — SODIUM CHLORIDE 0.9 % IV SOLN
700.0000 mg | Freq: Once | INTRAVENOUS | Status: AC
  Administered 2017-02-17: 700 mg via INTRAVENOUS
  Filled 2017-02-17: qty 70

## 2017-02-17 MED ORDER — FAMOTIDINE IN NACL 20-0.9 MG/50ML-% IV SOLN
20.0000 mg | Freq: Once | INTRAVENOUS | Status: AC
  Administered 2017-02-17: 20 mg via INTRAVENOUS
  Filled 2017-02-17: qty 50

## 2017-02-17 MED ORDER — PALONOSETRON HCL INJECTION 0.25 MG/5ML
0.2500 mg | Freq: Once | INTRAVENOUS | Status: AC
  Administered 2017-02-17: 0.25 mg via INTRAVENOUS
  Filled 2017-02-17: qty 5

## 2017-02-17 MED ORDER — SODIUM CHLORIDE 0.9 % IV SOLN: 10.0000 mg | Freq: Once | INTRAVENOUS | Status: DC

## 2017-02-17 MED ORDER — HEPARIN SOD (PORK) LOCK FLUSH 100 UNIT/ML IV SOLN
500.0000 [IU] | Freq: Once | INTRAVENOUS | Status: AC
  Administered 2017-02-17: 500 [IU] via INTRAVENOUS
  Filled 2017-02-17: qty 5

## 2017-02-17 MED ORDER — SODIUM CHLORIDE 0.9 % IV SOLN
Freq: Once | INTRAVENOUS | Status: AC
  Administered 2017-02-17: 10:00:00 via INTRAVENOUS
  Filled 2017-02-17: qty 1000

## 2017-02-17 MED ORDER — DEXAMETHASONE SODIUM PHOSPHATE 10 MG/ML IJ SOLN
10.0000 mg | Freq: Once | INTRAMUSCULAR | Status: AC
  Administered 2017-02-17: 10 mg via INTRAVENOUS
  Filled 2017-02-17: qty 1

## 2017-02-17 MED ORDER — PEGFILGRASTIM 6 MG/0.6ML ~~LOC~~ PSKT
6.0000 mg | PREFILLED_SYRINGE | Freq: Once | SUBCUTANEOUS | Status: AC
  Administered 2017-02-17: 6 mg via SUBCUTANEOUS
  Filled 2017-02-17: qty 0.6

## 2017-02-17 MED ORDER — SODIUM CHLORIDE 0.9 % IV SOLN
175.0000 mg/m2 | Freq: Once | INTRAVENOUS | Status: AC
  Administered 2017-02-17: 330 mg via INTRAVENOUS
  Filled 2017-02-17: qty 55

## 2017-02-17 MED ORDER — SODIUM CHLORIDE 0.9% FLUSH
10.0000 mL | Freq: Once | INTRAVENOUS | Status: AC
  Administered 2017-02-17: 10 mL via INTRAVENOUS
  Filled 2017-02-17: qty 10

## 2017-02-17 MED ORDER — DIPHENHYDRAMINE HCL 50 MG/ML IJ SOLN
25.0000 mg | Freq: Once | INTRAMUSCULAR | Status: AC
  Administered 2017-02-17: 25 mg via INTRAVENOUS
  Filled 2017-02-17: qty 1

## 2017-02-18 LAB — CA 125: CANCER ANTIGEN (CA) 125: 41.2 U/mL — AB (ref 0.0–38.1)

## 2017-02-20 ENCOUNTER — Other Ambulatory Visit: Payer: Self-pay | Admitting: Oncology

## 2017-02-20 ENCOUNTER — Inpatient Hospital Stay: Payer: PPO

## 2017-02-20 DIAGNOSIS — Z5111 Encounter for antineoplastic chemotherapy: Secondary | ICD-10-CM | POA: Diagnosis not present

## 2017-02-20 DIAGNOSIS — C569 Malignant neoplasm of unspecified ovary: Secondary | ICD-10-CM

## 2017-02-20 DIAGNOSIS — C801 Malignant (primary) neoplasm, unspecified: Secondary | ICD-10-CM

## 2017-02-20 MED ORDER — SODIUM CHLORIDE 0.9 % IV SOLN: 10.0000 mg | Freq: Once | INTRAVENOUS | Status: DC

## 2017-02-20 MED ORDER — SODIUM CHLORIDE 0.9% FLUSH
10.0000 mL | INTRAVENOUS | Status: DC | PRN
  Filled 2017-02-20: qty 10

## 2017-02-20 MED ORDER — SODIUM CHLORIDE 0.9 % IV SOLN
Freq: Once | INTRAVENOUS | Status: AC
  Filled 2017-02-20: qty 1000

## 2017-02-20 MED ORDER — SODIUM CHLORIDE 0.9 % IV SOLN
Freq: Once | INTRAVENOUS | Status: AC
  Administered 2017-02-20: 14:00:00 via INTRAVENOUS
  Filled 2017-02-20: qty 1000

## 2017-02-20 MED ORDER — HEPARIN SOD (PORK) LOCK FLUSH 100 UNIT/ML IV SOLN
500.0000 [IU] | Freq: Once | INTRAVENOUS | Status: AC
  Administered 2017-02-20: 500 [IU] via INTRAVENOUS

## 2017-02-20 MED ORDER — DEXAMETHASONE SODIUM PHOSPHATE 10 MG/ML IJ SOLN: 10.0000 mg | Freq: Once | INTRAMUSCULAR | Status: DC

## 2017-02-20 MED ORDER — SODIUM CHLORIDE 0.9 % IV SOLN
INTRAVENOUS | Status: DC
  Filled 2017-02-20: qty 1000

## 2017-02-20 MED ORDER — DEXAMETHASONE SODIUM PHOSPHATE 10 MG/ML IJ SOLN
10.0000 mg | Freq: Once | INTRAMUSCULAR | Status: AC
  Administered 2017-02-20: 10 mg via INTRAVENOUS
  Filled 2017-02-20: qty 1

## 2017-02-27 ENCOUNTER — Other Ambulatory Visit: Payer: Self-pay | Admitting: Oncology

## 2017-02-28 ENCOUNTER — Other Ambulatory Visit: Payer: Self-pay | Admitting: Oncology

## 2017-03-03 ENCOUNTER — Other Ambulatory Visit: Payer: Self-pay | Admitting: Oncology

## 2017-03-03 DIAGNOSIS — C569 Malignant neoplasm of unspecified ovary: Secondary | ICD-10-CM

## 2017-03-03 DIAGNOSIS — M5441 Lumbago with sciatica, right side: Principal | ICD-10-CM

## 2017-03-03 DIAGNOSIS — M542 Cervicalgia: Secondary | ICD-10-CM

## 2017-03-03 DIAGNOSIS — G8929 Other chronic pain: Secondary | ICD-10-CM

## 2017-03-03 MED ORDER — ALPRAZOLAM 0.25 MG PO TABS: 0.2500 mg | ORAL_TABLET | Freq: Two times a day (BID) | ORAL | 0 refills | Status: DC | PRN

## 2017-03-03 MED ORDER — FENTANYL 50 MCG/HR TD PT72: 50.0000 ug | MEDICATED_PATCH | TRANSDERMAL | 0 refills | Status: DC

## 2017-03-03 MED ORDER — OXYCODONE-ACETAMINOPHEN 10-325 MG PO TABS: 1.0000 | ORAL_TABLET | Freq: Three times a day (TID) | ORAL | 0 refills | Status: DC | PRN

## 2017-03-03 NOTE — Telephone Encounter (Signed)
This is a Dr. Grayland Ormond patient. I will forward this to his team.

## 2017-03-06 ENCOUNTER — Ambulatory Visit
Admission: RE | Admit: 2017-03-06 | Discharge: 2017-03-06 | Disposition: A | Discharge status: Home / Self Care | Payer: PPO | Source: Ambulatory Visit | Attending: Oncology | Admitting: Oncology

## 2017-03-06 DIAGNOSIS — C569 Malignant neoplasm of unspecified ovary: Secondary | ICD-10-CM | POA: Insufficient documentation

## 2017-03-06 DIAGNOSIS — Z9049 Acquired absence of other specified parts of digestive tract: Secondary | ICD-10-CM | POA: Insufficient documentation

## 2017-03-06 DIAGNOSIS — R911 Solitary pulmonary nodule: Secondary | ICD-10-CM | POA: Diagnosis not present

## 2017-03-06 DIAGNOSIS — I700 Atherosclerosis of aorta: Secondary | ICD-10-CM | POA: Diagnosis not present

## 2017-03-06 DIAGNOSIS — K5900 Constipation, unspecified: Secondary | ICD-10-CM | POA: Diagnosis not present

## 2017-03-06 DIAGNOSIS — K529 Noninfective gastroenteritis and colitis, unspecified: Secondary | ICD-10-CM | POA: Diagnosis not present

## 2017-03-06 MED ORDER — IOPAMIDOL (ISOVUE-300) INJECTION 61%
100.0000 mL | Freq: Once | INTRAVENOUS | Status: AC | PRN
  Administered 2017-03-06: 100 mL via INTRAVENOUS

## 2017-03-07 ENCOUNTER — Other Ambulatory Visit: Payer: Self-pay | Admitting: Family Medicine

## 2017-03-07 ENCOUNTER — Inpatient Hospital Stay: Payer: PPO | Admitting: *Deleted

## 2017-03-07 ENCOUNTER — Ambulatory Visit: Payer: PPO | Admitting: Oncology

## 2017-03-07 DIAGNOSIS — Z95828 Presence of other vascular implants and grafts: Secondary | ICD-10-CM

## 2017-03-07 DIAGNOSIS — Z1231 Encounter for screening mammogram for malignant neoplasm of breast: Secondary | ICD-10-CM

## 2017-03-07 DIAGNOSIS — C7960 Secondary malignant neoplasm of unspecified ovary: Secondary | ICD-10-CM

## 2017-03-07 DIAGNOSIS — Z5111 Encounter for antineoplastic chemotherapy: Secondary | ICD-10-CM | POA: Diagnosis not present

## 2017-03-07 LAB — CBC WITH DIFFERENTIAL/PLATELET
Basophils Absolute: 0 10*3/uL (ref 0–0.1)
Basophils Relative: 1 %
EOS PCT: 1 %
Eosinophils Absolute: 0 10*3/uL (ref 0–0.7)
HCT: 31.6 % — ABNORMAL LOW (ref 35.0–47.0)
Hemoglobin: 11 g/dL — ABNORMAL LOW (ref 12.0–16.0)
LYMPHS ABS: 1.5 10*3/uL (ref 1.0–3.6)
LYMPHS PCT: 28 %
MCH: 32.5 pg (ref 26.0–34.0)
MCHC: 34.8 g/dL (ref 32.0–36.0)
MCV: 93.3 fL (ref 80.0–100.0)
MONO ABS: 0.6 10*3/uL (ref 0.2–0.9)
MONOS PCT: 11 %
Neutro Abs: 3.1 10*3/uL (ref 1.4–6.5)
Neutrophils Relative %: 59 %
PLATELETS: 115 10*3/uL — AB (ref 150–440)
RBC: 3.39 MIL/uL — AB (ref 3.80–5.20)
RDW: 16.5 % — ABNORMAL HIGH (ref 11.5–14.5)
WBC: 5.2 10*3/uL (ref 3.6–11.0)

## 2017-03-07 LAB — COMPREHENSIVE METABOLIC PANEL
ALT: 21 U/L (ref 14–54)
ANION GAP: 9 (ref 5–15)
AST: 31 U/L (ref 15–41)
Albumin: 3.2 g/dL — ABNORMAL LOW (ref 3.5–5.0)
Alkaline Phosphatase: 111 U/L (ref 38–126)
BUN: 8 mg/dL (ref 6–20)
CALCIUM: 8.6 mg/dL — AB (ref 8.9–10.3)
CHLORIDE: 100 mmol/L — AB (ref 101–111)
CO2: 26 mmol/L (ref 22–32)
CREATININE: 0.63 mg/dL (ref 0.44–1.00)
Glucose, Bld: 137 mg/dL — ABNORMAL HIGH (ref 65–99)
Potassium: 3.7 mmol/L (ref 3.5–5.1)
Sodium: 135 mmol/L (ref 135–145)
Total Bilirubin: 0.4 mg/dL (ref 0.3–1.2)
Total Protein: 6.2 g/dL — ABNORMAL LOW (ref 6.5–8.1)

## 2017-03-07 MED ORDER — HEPARIN SOD (PORK) LOCK FLUSH 100 UNIT/ML IV SOLN
500.0000 [IU] | INTRAVENOUS | Status: AC | PRN
  Administered 2017-03-07: 500 [IU]

## 2017-03-07 MED ORDER — SODIUM CHLORIDE 0.9% FLUSH
10.0000 mL | INTRAVENOUS | Status: AC | PRN
  Administered 2017-03-07: 10 mL
  Filled 2017-03-07: qty 10

## 2017-03-08 LAB — CA 125: Cancer Antigen (CA) 125: 55.5 U/mL — ABNORMAL HIGH (ref 0.0–38.1)

## 2017-03-10 ENCOUNTER — Inpatient Hospital Stay: Payer: PPO

## 2017-03-10 ENCOUNTER — Ambulatory Visit: Payer: PPO | Admitting: Oncology

## 2017-03-10 ENCOUNTER — Inpatient Hospital Stay (HOSPITAL_BASED_OUTPATIENT_CLINIC_OR_DEPARTMENT_OTHER): Payer: PPO | Admitting: Oncology

## 2017-03-10 ENCOUNTER — Other Ambulatory Visit: Payer: PPO

## 2017-03-10 VITALS — BP 138/86 | HR 87 | Temp 97.7°F | Resp 18 | Wt 170.4 lb

## 2017-03-10 DIAGNOSIS — G629 Polyneuropathy, unspecified: Secondary | ICD-10-CM | POA: Diagnosis not present

## 2017-03-10 DIAGNOSIS — Z79899 Other long term (current) drug therapy: Secondary | ICD-10-CM | POA: Diagnosis not present

## 2017-03-10 DIAGNOSIS — G620 Drug-induced polyneuropathy: Secondary | ICD-10-CM

## 2017-03-10 DIAGNOSIS — C569 Malignant neoplasm of unspecified ovary: Secondary | ICD-10-CM

## 2017-03-10 DIAGNOSIS — D696 Thrombocytopenia, unspecified: Secondary | ICD-10-CM

## 2017-03-10 DIAGNOSIS — K5900 Constipation, unspecified: Secondary | ICD-10-CM | POA: Diagnosis not present

## 2017-03-10 DIAGNOSIS — Z5111 Encounter for antineoplastic chemotherapy: Secondary | ICD-10-CM | POA: Diagnosis not present

## 2017-03-10 MED ORDER — SODIUM CHLORIDE 0.9 % IV SOLN
701.4000 mg | Freq: Once | INTRAVENOUS | Status: AC
  Administered 2017-03-10: 700 mg via INTRAVENOUS
  Filled 2017-03-10: qty 70

## 2017-03-10 MED ORDER — DEXAMETHASONE SODIUM PHOSPHATE 10 MG/ML IJ SOLN
10.0000 mg | Freq: Once | INTRAMUSCULAR | Status: AC
  Administered 2017-03-10: 10 mg via INTRAVENOUS

## 2017-03-10 MED ORDER — SODIUM CHLORIDE 0.9 % IV SOLN: 10.0000 mg | Freq: Once | INTRAVENOUS | Status: DC

## 2017-03-10 MED ORDER — HEPARIN SOD (PORK) LOCK FLUSH 100 UNIT/ML IV SOLN
250.0000 [IU] | Freq: Once | INTRAVENOUS | Status: DC | PRN
Start: 2017-03-10 — End: 2017-03-10

## 2017-03-10 MED ORDER — FAMOTIDINE IN NACL 20-0.9 MG/50ML-% IV SOLN
20.0000 mg | Freq: Once | INTRAVENOUS | Status: AC
  Administered 2017-03-10: 20 mg via INTRAVENOUS

## 2017-03-10 MED ORDER — PACLITAXEL CHEMO INJECTION 300 MG/50ML
175.0000 mg/m2 | Freq: Once | INTRAVENOUS | Status: AC
  Administered 2017-03-10: 330 mg via INTRAVENOUS
  Filled 2017-03-10: qty 55

## 2017-03-10 MED ORDER — SODIUM CHLORIDE 0.9% FLUSH
10.0000 mL | INTRAVENOUS | Status: DC | PRN
  Administered 2017-03-10: 10 mL
  Filled 2017-03-10: qty 10

## 2017-03-10 MED ORDER — DIPHENHYDRAMINE HCL 50 MG/ML IJ SOLN
25.0000 mg | Freq: Once | INTRAMUSCULAR | Status: AC
  Administered 2017-03-10: 25 mg via INTRAVENOUS

## 2017-03-10 MED ORDER — DIPHENHYDRAMINE HCL 50 MG/ML IJ SOLN
INTRAMUSCULAR | Status: AC
  Filled 2017-03-10: qty 1

## 2017-03-10 MED ORDER — PEGFILGRASTIM 6 MG/0.6ML ~~LOC~~ PSKT
6.0000 mg | PREFILLED_SYRINGE | Freq: Once | SUBCUTANEOUS | Status: AC
  Administered 2017-03-10: 6 mg via SUBCUTANEOUS
  Filled 2017-03-10: qty 0.6

## 2017-03-10 MED ORDER — HEPARIN SOD (PORK) LOCK FLUSH 100 UNIT/ML IV SOLN
500.0000 [IU] | Freq: Once | INTRAVENOUS | Status: AC | PRN
  Administered 2017-03-10: 500 [IU]
  Filled 2017-03-10: qty 5

## 2017-03-10 MED ORDER — PALONOSETRON HCL INJECTION 0.25 MG/5ML
0.2500 mg | Freq: Once | INTRAVENOUS | Status: AC
  Administered 2017-03-10: 0.25 mg via INTRAVENOUS

## 2017-03-10 MED ORDER — LORAZEPAM 2 MG/ML IJ SOLN
0.5000 mg | Freq: Once | INTRAMUSCULAR | Status: AC
  Administered 2017-03-10: 0.5 mg via INTRAVENOUS
  Filled 2017-03-10: qty 1

## 2017-03-10 MED ORDER — SODIUM CHLORIDE 0.9 % IV SOLN
Freq: Once | INTRAVENOUS | Status: AC
  Administered 2017-03-10: 08:00:00 via INTRAVENOUS
  Filled 2017-03-10: qty 1000

## 2017-03-10 NOTE — Progress Notes (Signed)
[place]  [phone] [phone]  ID: Jessica Burgess OB: Oct 17, 1955  MR#: [id number]  CNO#:[id number]  Patient Care Team: Steele Sizer, MD as PCP - General (Family Medicine) Clent Jacks, RN as Registered Nurse  CHIEF COMPLAINT: Stage IIIc high-grade serous ovarian carcinoma  INTERVAL HISTORY: Patient returns to clinic today for further evaluation and consideration of cycle 5 of 6 of carboplatinum and Taxol.  Patient has recovered from pneumonia and finished her PO Levaquin. Her cough symptoms completely resolved. Complains of constipation but admits to not taking stool softeners and laxatives as prescribed. Complains of worsening peripheral neuropathy. She states that both fingers and toes are worse then previous visits. She continues to have lower back pain but this is chronic for her. Her abdominal wound is completely healed. She has noticed some shortness of breath with exertion but denies chest pain. She has progressing worsening fatigue and weakness that is worse right after chemo but resolves prior to next dose. Her appetite has improved. She denies any recent fevers. She denies any nausea, vomiting or diarrhea. She has no urinary complaints.  REVIEW OF SYSTEMS:   Review of Systems  Constitutional: Negative.  Negative for fever, malaise/fatigue and weight loss.  HENT: Negative.   Respiratory: Positive for shortness of breath. Negative for cough.        With exertion  Cardiovascular: Negative.  Negative for chest pain and leg swelling.  Gastrointestinal: Positive for constipation. Negative for abdominal pain, blood in stool, diarrhea, melena, nausea and vomiting.  Genitourinary: Negative.   Musculoskeletal: Positive for back pain and joint pain. Negative for neck pain.  Skin: Negative.  Negative for rash.  Neurological: Positive for sensory change. Negative for dizziness and weakness.  Psychiatric/Behavioral: The patient is  nervous/anxious.     As per HPI. Otherwise, a complete review of systems is negative.  PAST MEDICAL HISTORY: Past Medical History:  Diagnosis Date  . Allergic rhinitis, cause unspecified   . Anxiety state, unspecified   . Arthritis   . Asthma    only when sick   . Backache, unspecified   . Bronchitis    hx of when get sick  . Cancer ([place])    skin cancer , basal cell   . Cervicalgia   . Complication of anesthesia   . Dermatophytosis of nail   . Dysmetabolic syndrome X   . Encounter for long-term (current) use of other medications   . Esophageal reflux   . Insomnia, unspecified   . Leukocytosis, unspecified   . Migraine without aura, without mention of intractable migraine without mention of status migrainosus   . Other and unspecified hyperlipidemia   . Other malaise and fatigue   . Overweight(278.02)   . PONV (postoperative nausea and vomiting)   . Spinal stenosis in cervical region   . Symptomatic menopausal or female climacteric states   . Unspecified disorder of skin and subcutaneous tissue   . Unspecified vitamin D deficiency     PAST SURGICAL HISTORY: Past Surgical History:  Procedure Laterality Date  . ABDOMINAL HYSTERECTOMY    . ANTERIOR CERVICAL DECOMP/DISCECTOMY FUSION N/A 06/07/2015   Procedure: Cervical three - four and Cervical six- seven anterior cervical decompression with fusion interbody prosthesis plating and bonegraft;  Surgeon: Newman Pies, MD;  Location: [facility];  Service: Neurosurgery;  Laterality: N/A;  C34 and C67 anterior cervical decompression with fusion interbody prosthesis plating and bonegraft  . BACK SURGERY     x2 Lower   .  EVACUATION OF CERVICAL HEMATOMA N/A 06/14/2015   Procedure: EVACUATION OF CERVICAL HEMATOMA;  Surgeon: Newman Pies, MD;  Location: [facility];  Service: Neurosurgery;  Laterality: N/A;  . NECK SURGERY     x3  . TONSILLECTOMY      FAMILY HISTORY: Family History  Problem Relation Age of Onset  .  Depression Mother   . Migraines Mother   . Dementia Father   . Hyperlipidemia Brother   . Breast cancer Paternal Aunt        85s    ADVANCED DIRECTIVES (Y/N):  N  HEALTH MAINTENANCE: Social History   Tobacco Use  . Smoking status: Former Smoker    Packs/day: 1.50    Years: 20.00    Pack years: 30.00    Types: Cigarettes    Start date: 03/19/1979    Last attempt to quit: 08/25/1999    Years since quitting: 17.5  . Smokeless tobacco: Never Used  Substance Use Topics  . Alcohol use: Yes    Alcohol/week: 0.0 oz    Comment: wine occassionally  . Drug use: No     Colonoscopy:  PAP:  Bone density:  Lipid panel:  No Known Allergies  Current Outpatient Medications  Medication Sig Dispense Refill  . albuterol (PROVENTIL HFA;VENTOLIN HFA) 108 (90 Base) MCG/ACT inhaler Inhale 2 puffs into the lungs every 6 (six) hours as needed for wheezing or shortness of breath. 1 Inhaler 2  . albuterol (PROVENTIL) (2.5 MG/3ML) 0.083% nebulizer solution Take 3 mLs (2.5 mg total) by nebulization every 6 (six) hours as needed for wheezing or shortness of breath. 75 mL 2  . ALPRAZolam (XANAX) 0.25 MG tablet Take 1 tablet (0.25 mg total) by mouth 2 (two) times daily as needed for anxiety. 60 tablet 0  . Ascorbic Acid (VITAMIN C PO) Take by mouth.    Marland Kitchen atorvastatin (LIPITOR) 40 MG tablet take 1 tablet by mouth once daily 90 tablet 4  . budesonide-formoterol (SYMBICORT) 160-4.5 MCG/ACT inhaler Inhale 2 puffs into the lungs 2 (two) times daily.    . Cholecalciferol (VITAMIN D-1000 MAX ST) 1000 UNITS tablet Take 1 tablet by mouth 2 (two) times daily.    . Cyanocobalamin (B-12 PO) Take 1 tablet by mouth daily.     . cyclobenzaprine (FLEXERIL) 10 MG tablet take 1 tablet by mouth three times a day if needed for muscle spasm 30 tablet 0  . docusate sodium (COLACE) 100 MG capsule Take 1 capsule (100 mg total) by mouth 2 (two) times daily. 60 capsule 0  . DULoxetine (CYMBALTA) 60 MG capsule take 1 capsule by  mouth once daily 90 capsule 1  . enoxaparin (LOVENOX) 40 MG/0.4ML injection Inject 40 mg into the skin daily.    Marland Kitchen estradiol (ESTRACE) 0.5 MG tablet take 1 tablet by mouth once daily 90 tablet 4  . fentaNYL (DURAGESIC - DOSED MCG/HR) 25 MCG/HR patch Place 25 mcg onto the skin every 3 (three) days.  0  . fentaNYL (DURAGESIC - DOSED MCG/HR) 50 MCG/HR Place 1 patch (50 mcg total) onto the skin every 3 (three) days. 10 patch 0  . ferrous sulfate (IRON SUPPLEMENT) 325 (65 FE) MG tablet Take 1 tablet by mouth daily.    . fluticasone (FLONASE) 50 MCG/ACT nasal spray Place 2 sprays into both nostrils as needed. 16 g 5  . gabapentin (NEURONTIN) 300 MG capsule Take 1 capsule (300 mg total) by mouth 3 (three) times daily. Titrate to up to 300 mg three times daily 270 capsule  0  . ipratropium (ATROVENT HFA) 17 MCG/ACT inhaler Inhale 2 puffs into the lungs every 6 (six) hours.    Marland Kitchen levofloxacin (LEVAQUIN) 500 MG tablet Take 500 mg by mouth daily.    Marland Kitchen levothyroxine (SYNTHROID, LEVOTHROID) 50 MCG tablet Take 1 tablet by mouth daily.    Marland Kitchen lidocaine-prilocaine (EMLA) cream Apply to affected area once 30 g 3  . Magnesium Oxide 500 MG CAPS Take 500 mg by mouth 2 (two) times daily.     . metFORMIN (GLUCOPHAGE) 500 MG tablet Take 1 tablet (500 mg total) by mouth daily. 90 tablet 1  . montelukast (SINGULAIR) 10 MG tablet Take 10 mg by mouth at bedtime.    . Multiple Vitamins-Minerals (MULTIVITAMIN PO) Take 1 tablet by mouth daily.     . Omega-3 Fatty Acids (FISH OIL PO) Take by mouth.    . ondansetron (ZOFRAN) 8 MG tablet Take 1 tablet (8 mg total) by mouth 2 (two) times daily as needed for refractory nausea / vomiting. 60 tablet 2  . oxyCODONE-acetaminophen (PERCOCET) 10-325 MG tablet Take 1 tablet by mouth every 8 (eight) hours as needed for pain. 90 tablet 0  . Polyethylene Glycol 3350 (PEG 3350) POWD Take by mouth.    . potassium gluconate 595 MG TABS tablet Take 595 mg by mouth daily.    . prochlorperazine  (COMPAZINE) 10 MG tablet Take 1 tablet (10 mg total) by mouth every 6 (six) hours as needed (Nausea or vomiting). 60 tablet 2  . SUMAtriptan (IMITREX) 100 MG tablet take 1 tablet by mouth if needed for migraines as directed 27 tablet 1  . zolpidem (AMBIEN CR) 6.25 MG CR tablet Take 1 tablet (6.25 mg total) at bedtime by mouth. 90 tablet 0   No current facility-administered medications for this visit.    Facility-Administered Medications Ordered in Other Visits  Medication Dose Route Frequency Provider Last Rate Last Dose  . 0.9 %  sodium chloride infusion   Intravenous Once Lloyd Huger, MD      . 0.9 %  sodium chloride infusion   Intravenous Once Lloyd Huger, MD      . CARBOplatin (PARAPLATIN) 700 mg in sodium chloride 0.9 % 250 mL chemo infusion  700 mg Intravenous Once Lloyd Huger, MD   Stopped at 03/10/17 1200  . dexamethasone (DECADRON) 20 mg in sodium chloride 0.9 % 50 mL IVPB  20 mg Intravenous Once Lloyd Huger, MD      . dexamethasone (DECADRON) injection 10 mg  10 mg Intravenous Once Lloyd Huger, MD      . heparin lock flush 100 unit/mL  500 Units Intracatheter Once PRN Lloyd Huger, MD      . pegfilgrastim (NEULASTA ONPRO KIT) injection 6 mg  6 mg Subcutaneous Once Lloyd Huger, MD      . sodium chloride flush (NS) 0.9 % injection 10 mL  10 mL Intracatheter PRN Lloyd Huger, MD   10 mL at 03/10/17 0740    OBJECTIVE: There were no vitals filed for this visit.   There is no height or weight on file to calculate BMI.    ECOG FS:0 - Asymptomatic  General: Well-developed, well-nourished, no acute distress. Eyes: Pink conjunctiva, anicteric sclera. HEENT: Normocephalic, moist mucous membranes, clear oropharnyx. Lungs: Rare scattered wheezing, otherwise clear bilaterally. Heart: Regular rate and rhythm. No rubs, murmurs, or gallops. Abdomen: Surgical wound completely healed by secondary intention. Musculoskeletal: No edema,  cyanosis, or clubbing. Neuro: Alert, answering  all questions appropriately. Cranial nerves grossly intact. Skin: No rashes or petechiae noted. Psych: Normal affect.   LAB RESULTS:  Lab Results  Component Value Date   NA 135 03/07/2017   K 3.7 03/07/2017   CL 100 (L) 03/07/2017   CO2 26 03/07/2017   GLUCOSE 137 (H) 03/07/2017   BUN 8 03/07/2017   CREATININE 0.63 03/07/2017   CALCIUM 8.6 (L) 03/07/2017   PROT 6.2 (L) 03/07/2017   ALBUMIN 3.2 (L) 03/07/2017   AST 31 03/07/2017   ALT 21 03/07/2017   ALKPHOS 111 03/07/2017   BILITOT 0.4 03/07/2017   GFRNONAA >60 03/07/2017   GFRAA >60 03/07/2017    Lab Results  Component Value Date   WBC 5.2 03/07/2017   NEUTROABS 3.1 03/07/2017   HGB 11.0 (L) 03/07/2017   HCT 31.6 (L) 03/07/2017   MCV 93.3 03/07/2017   PLT 115 (L) 03/07/2017     STUDIES: Ct Abdomen Pelvis W Contrast  Result Date: 03/06/2017 CLINICAL DATA:  Ovarian cancer, status post surgery and chemotherapy. Restaging assessment. EXAM: CT ABDOMEN AND PELVIS WITH CONTRAST TECHNIQUE: Multidetector CT imaging of the abdomen and pelvis was performed using the standard protocol following bolus administration of intravenous contrast. CONTRAST:  181m ISOVUE-300 IOPAMIDOL (ISOVUE-300) INJECTION 61% COMPARISON:  11/30/2016 FINDINGS: Lower chest: Stable 0.4 cm pleural-based nodule along the right middle lobe, image 5/4. Mild right middle lobe and right lower lobe atelectasis along the right hemidiaphragm. Descending thoracic aortic atherosclerotic calcification. Hepatobiliary: Cholecystectomy. Extrahepatic biliary dilatation with the CBD up to 1.4 cm, stable. Mild intrahepatic biliary dilatation. Pancreas: Unremarkable Spleen: Unremarkable Adrenals/Urinary Tract: Unremarkable Stomach/Bowel: Prominent stool throughout the colon favors constipation. Wall thickening in the sigmoid colon extends to the level of the upper rectum. Vascular/Lymphatic: Aortoiliac atherosclerotic vascular  disease. No pathologic adenopathy identified. Reproductive: Hysterectomy and prior oophorectomy. No appreciable recurrent mass along the vaginal cuff. Other: Prior omentectomy. A small nodule along the left upper quadrant omentum on image 41/2 measures 0.6 by 0.4 cm, previously 1.0 by 0.8 cm by my measurement. No new omental or peritoneal nodularity. Results pelvic fluid collection. Musculoskeletal: New posterolateral rod and pedicle screw fixation at L5-S1 with posterior decompression. Lumbar spondylosis and degenerative disc disease noted with mild lumbar scoliosis. Mild degrees of impingement noted at L2-3. IMPRESSION: 1. Improvement in the minimal residual nodularity along the left upper quadrant omentum, compatible with response to therapy. 2. The pelvic fluid collection has resolved. 3. Chronic biliary dilatation, much of which is likely a physiologic response to cholecystectomy. 4. Wall thickening in the sigmoid colon, favoring low-grade colitis. This extends to the upper rectum. 5.  Prominent stool throughout the colon favors constipation. 6. Other imaging findings of potential clinical significance: Aortic Atherosclerosis (ICD10-I70.0). Mild impingement at L2-3. Stable 4 mm pleural-based nodule in the right middle lobe merits surveillance. Electronically Signed   By: WVan ClinesM.D.   On: 03/06/2017 11:58    ASSESSMENT: Stage IIIc high-grade serous ovarian carcinoma  PLAN:  1. Stage IIIc high-grade serous ovarian carcinoma:  [facility]. She was also initiated on a clinical trial and has received preoperative infusion of Keytruda on November 11, 2016. Proceed with cycle 5 of 6 of adjuvant carboplatin and Taxol today. Continue with Neulasta support. Patient's CA-125 has decreased significantly and is 55.5 (slightly up from 12/3 41.2) today. Return to clinic in 3 weeks for consideration of cycle 6. CT scan showed Improvement in the  minimal residual nodularity along the  left upper quadrant omentum, compatible with response to therapy. Patient was given a print off of results and CT scan.  2. Wound healing: Resolved. Proceed with treatment as above.  3. Leukocytosis: Resolved. Secondary to Neulasta. 4. Pain: Patient on chronic narcotics from previous back surgeries. Talked at length with the patient and it was agreed upon that oncology would manage her pain while she is on chemotherapy, but then once chemotherapy is completed, will refer her pain management back to her primary care physician.  Continue fentanyl patch to 50 mcg every 72 hours. Continue Percocet and Flexeril as needed.   5. Constipation: CT showed prominent stool throughout the colon favors constipation. Encouraged patient to take Colace and MiraLAX as prescribed.  6. Thrombocytopenia: PLT 115. Monitor for now. Proceed with treatment as above. 7. Anxiety: Patient reported increased anxiety for 24-48 hrs. after treatment, but has asked to keep dexamethasone in her treatment plan secondary to her joint pain and swelling. 8. Joint pain and swelling: Dexamethasone as above. Patient was also instructed should she can take ibuprofen sparingly. 9. PN: Continue to monitor for now. May need dose reduction for cycle 6. Patient educated that peripheral neuropathy can be permanent if appropriate dose reduction does not take place. Patient aware. Dr. Grayland Ormond notified and ok with current dose.   Patient expressed understanding and was in agreement with this plan. She also understands that She can call clinic at any time with any questions, concerns, or complaints.   Cancer Staging Ovarian cancer, unspecified laterality ([place]) Staging form: Ovary, Fallopian Tube, and Primary Peritoneal Carcinoma, AJCC 8th Edition - Clinical: No stage assigned - Unsigned   Jacquelin Hawking, NP   03/10/2017 11:41 AM

## 2017-03-12 ENCOUNTER — Ambulatory Visit
Admission: RE | Admit: 2017-03-12 | Discharge: 2017-03-12 | Disposition: A | Discharge status: Home / Self Care | Payer: PPO | Source: Ambulatory Visit | Attending: Family Medicine | Admitting: Family Medicine

## 2017-03-12 DIAGNOSIS — Z1231 Encounter for screening mammogram for malignant neoplasm of breast: Secondary | ICD-10-CM | POA: Insufficient documentation

## 2017-03-12 HISTORY — DX: Personal history of antineoplastic chemotherapy: Z92.21

## 2017-03-26 ENCOUNTER — Other Ambulatory Visit: Payer: Self-pay | Admitting: Family Medicine

## 2017-03-26 DIAGNOSIS — F329 Major depressive disorder, single episode, unspecified: Secondary | ICD-10-CM

## 2017-03-26 DIAGNOSIS — M542 Cervicalgia: Secondary | ICD-10-CM

## 2017-03-26 DIAGNOSIS — F411 Generalized anxiety disorder: Secondary | ICD-10-CM

## 2017-03-26 DIAGNOSIS — G8929 Other chronic pain: Secondary | ICD-10-CM

## 2017-03-26 DIAGNOSIS — M5441 Lumbago with sciatica, right side: Secondary | ICD-10-CM

## 2017-03-26 NOTE — Telephone Encounter (Signed)
Refill request for general medication. Cymbalta to [facility].  Last office visit:11/12/2016  No Follow-up on file.

## 2017-03-30 NOTE — Progress Notes (Signed)
[place]  [phone] Fax:(336) [phone]  ID: Jessica Burgess OB: 1955-06-17  MR#: [id number]  STM#:[id number]  Patient Care Team: Steele Sizer, MD as PCP - General (Family Medicine) Clent Jacks, RN as Registered Nurse  CHIEF COMPLAINT: Stage IIIc high-grade serous ovarian carcinoma  INTERVAL HISTORY: Patient returns to clinic today for further evaluation and consideration of cycle 6 of 6 of carboplatinum and Taxol.  She continues to have shortness of breath, that is chronic and unchanged.  She states her neuropathy in hands and feet has become worse over the past several weeks. She otherwise feels well. Her abdominal wound is completely healed. Her appetite has improved and she no longer has weakness or fatigue. She denies any recent fevers. She does not complain of chest pain, cough, or hemoptysis today. She denies any nausea, vomiting, constipation, or diarrhea. She has no urinary complaints. Patient offers no further specific complaints today.  REVIEW OF SYSTEMS:   Review of Systems  Constitutional: Negative.  Negative for fever, malaise/fatigue and weight loss.  Respiratory: Positive for shortness of breath. Negative for cough.   Cardiovascular: Negative.  Negative for chest pain and leg swelling.  Gastrointestinal: Negative.  Negative for abdominal pain, blood in stool, constipation, diarrhea, melena, nausea and vomiting.  Genitourinary: Negative.   Musculoskeletal: Positive for back pain, joint pain and neck pain.  Skin: Negative.  Negative for rash.  Neurological: Positive for tingling and sensory change. Negative for weakness.  Psychiatric/Behavioral: The patient is nervous/anxious.     As per HPI. Otherwise, a complete review of systems is negative.  PAST MEDICAL HISTORY: Past Medical History:  Diagnosis Date  . Allergic rhinitis, cause unspecified   . Anxiety state, unspecified   . Arthritis   . Asthma    only when sick   .  Backache, unspecified   . Bronchitis    hx of when get sick  . Cancer ([place])    skin cancer , basal cell   . Cancer ([place]) 11/2016   ovarian  . Cervicalgia   . Complication of anesthesia   . Dermatophytosis of nail   . Dysmetabolic syndrome X   . Encounter for long-term (current) use of other medications   . Esophageal reflux   . Insomnia, unspecified   . Leukocytosis, unspecified   . Migraine without aura, without mention of intractable migraine without mention of status migrainosus   . Other and unspecified hyperlipidemia   . Other malaise and fatigue   . Overweight(278.02)   . Personal history of chemotherapy now   ovarian  . PONV (postoperative nausea and vomiting)   . Spinal stenosis in cervical region   . Symptomatic menopausal or female climacteric states   . Unspecified disorder of skin and subcutaneous tissue   . Unspecified vitamin D deficiency     PAST SURGICAL HISTORY: Past Surgical History:  Procedure Laterality Date  . ABDOMINAL HYSTERECTOMY    . ANTERIOR CERVICAL DECOMP/DISCECTOMY FUSION N/A 06/07/2015   Procedure: Cervical three - four and Cervical six- seven anterior cervical decompression with fusion interbody prosthesis plating and bonegraft;  Surgeon: Newman Pies, MD;  Location: [facility];  Service: Neurosurgery;  Laterality: N/A;  C34 and C67 anterior cervical decompression with fusion interbody prosthesis plating and bonegraft  . BACK SURGERY     x2 Lower   . EVACUATION OF CERVICAL HEMATOMA N/A 06/14/2015   Procedure: EVACUATION OF CERVICAL HEMATOMA;  Surgeon: Newman Pies, MD;  Location: [facility];  Service: Neurosurgery;  Laterality: N/A;  . NECK SURGERY     x3  . TONSILLECTOMY      FAMILY HISTORY: Family History  Problem Relation Age of Onset  . Depression Mother   . Migraines Mother   . Dementia Father   . Hyperlipidemia Brother   . Breast cancer Paternal Aunt        52s    ADVANCED DIRECTIVES (Y/N):  N  HEALTH  MAINTENANCE: Social History   Tobacco Use  . Smoking status: Former Smoker    Packs/day: 1.50    Years: 20.00    Pack years: 30.00    Types: Cigarettes    Start date: 03/19/1979    Last attempt to quit: 08/25/1999    Years since quitting: 17.6  . Smokeless tobacco: Never Used  Substance Use Topics  . Alcohol use: Yes    Alcohol/week: 0.0 oz    Comment: wine occassionally  . Drug use: No     Colonoscopy:  PAP:  Bone density:  Lipid panel:  No Known Allergies  Current Outpatient Medications  Medication Sig Dispense Refill  . albuterol (PROVENTIL HFA;VENTOLIN HFA) 108 (90 Base) MCG/ACT inhaler Inhale 2 puffs into the lungs every 6 (six) hours as needed for wheezing or shortness of breath. 1 Inhaler 2  . albuterol (PROVENTIL) (2.5 MG/3ML) 0.083% nebulizer solution Take 3 mLs (2.5 mg total) by nebulization every 6 (six) hours as needed for wheezing or shortness of breath. 75 mL 2  . ALPRAZolam (XANAX) 0.25 MG tablet Take 1 tablet (0.25 mg total) by mouth 2 (two) times daily as needed for anxiety. 60 tablet 0  . Ascorbic Acid (VITAMIN C PO) Take by mouth.    . budesonide-formoterol (SYMBICORT) 160-4.5 MCG/ACT inhaler Inhale 2 puffs into the lungs 2 (two) times daily.    . Cholecalciferol (VITAMIN D-1000 MAX ST) 1000 UNITS tablet Take 1 tablet by mouth 2 (two) times daily.    . Cyanocobalamin (B-12 PO) Take 1 tablet by mouth daily.     . cyclobenzaprine (FLEXERIL) 10 MG tablet take 1 tablet by mouth three times a day if needed for muscle spasm 30 tablet 0  . docusate sodium (COLACE) 100 MG capsule Take 1 capsule (100 mg total) by mouth 2 (two) times daily. 60 capsule 0  . DULoxetine (CYMBALTA) 60 MG capsule take 1 capsule by mouth once daily 90 capsule 0  . estradiol (ESTRACE) 0.5 MG tablet take 1 tablet by mouth once daily 90 tablet 4  . fentaNYL (DURAGESIC - DOSED MCG/HR) 50 MCG/HR Place 1 patch (50 mcg total) onto the skin every 3 (three) days. 10 patch 0  . ferrous sulfate (IRON  SUPPLEMENT) 325 (65 FE) MG tablet Take 1 tablet by mouth daily.    . fluticasone (FLONASE) 50 MCG/ACT nasal spray Place 2 sprays into both nostrils as needed. 16 g 5  . gabapentin (NEURONTIN) 300 MG capsule Take 1 capsule (300 mg total) by mouth 3 (three) times daily. Titrate to up to 300 mg three times daily 270 capsule 0  . ipratropium (ATROVENT HFA) 17 MCG/ACT inhaler Inhale 2 puffs into the lungs every 6 (six) hours.    . lidocaine-prilocaine (EMLA) cream Apply to affected area once 30 g 3  . Magnesium Oxide 500 MG CAPS Take 500 mg by mouth 2 (two) times daily.     . montelukast (SINGULAIR) 10 MG tablet Take 10 mg by mouth at bedtime.    . Multiple Vitamins-Minerals (MULTIVITAMIN PO) Take 1 tablet by mouth daily.     Marland Kitchen  Omega-3 Fatty Acids (FISH OIL PO) Take by mouth.    . ondansetron (ZOFRAN) 8 MG tablet Take 1 tablet (8 mg total) by mouth 2 (two) times daily as needed for refractory nausea / vomiting. 60 tablet 2  . oxyCODONE-acetaminophen (PERCOCET) 10-325 MG tablet Take 1 tablet by mouth every 8 (eight) hours as needed for pain. 90 tablet 0  . Polyethylene Glycol 3350 (PEG 3350) POWD Take by mouth.    . potassium gluconate 595 MG TABS tablet Take 595 mg by mouth daily.    . prochlorperazine (COMPAZINE) 10 MG tablet Take 1 tablet (10 mg total) by mouth every 6 (six) hours as needed (Nausea or vomiting). 60 tablet 2  . SUMAtriptan (IMITREX) 100 MG tablet take 1 tablet by mouth if needed for migraines as directed 27 tablet 1  . zolpidem (AMBIEN CR) 6.25 MG CR tablet Take 1 tablet (6.25 mg total) at bedtime by mouth. 90 tablet 0  . atorvastatin (LIPITOR) 40 MG tablet take 1 tablet by mouth once daily (Patient not taking: Reported on 03/31/2017) 90 tablet 4  . levothyroxine (SYNTHROID, LEVOTHROID) 50 MCG tablet Take 1 tablet by mouth daily.     No current facility-administered medications for this visit.    Facility-Administered Medications Ordered in Other Visits  Medication Dose Route  Frequency Provider Last Rate Last Dose  . 0.9 %  sodium chloride infusion   Intravenous Once Lloyd Huger, MD      . 0.9 %  sodium chloride infusion   Intravenous Once Lloyd Huger, MD      . dexamethasone (DECADRON) 20 mg in sodium chloride 0.9 % 50 mL IVPB  20 mg Intravenous Once Lloyd Huger, MD      . dexamethasone (DECADRON) injection 10 mg  10 mg Intravenous Once Lloyd Huger, MD        OBJECTIVE: Vitals:   03/31/17 0919  BP: 120/69  Pulse: 94  Resp: 18  Temp: 97.7 F (36.5 C)     Body mass index is 30.54 kg/m.    ECOG FS:0 - Asymptomatic  General: Well-developed, well-nourished, no acute distress. Eyes: Pink conjunctiva, anicteric sclera. HEENT: Normocephalic, moist mucous membranes, clear oropharnyx. Lungs: Rare scattered wheezing, otherwise clear bilaterally. Heart: Regular rate and rhythm. No rubs, murmurs, or gallops. Abdomen: Surgical wound completely healed by secondary intention. Musculoskeletal: No edema, cyanosis, or clubbing. Neuro: Alert, answering all questions appropriately. Cranial nerves grossly intact. Skin: No rashes or petechiae noted. Psych: Normal affect.   LAB RESULTS:  Lab Results  Component Value Date   NA 138 03/31/2017   K 3.5 03/31/2017   CL 101 03/31/2017   CO2 28 03/31/2017   GLUCOSE 95 03/31/2017   BUN 10 03/31/2017   CREATININE 0.67 03/31/2017   CALCIUM 8.7 (L) 03/31/2017   PROT 6.6 03/31/2017   ALBUMIN 3.4 (L) 03/31/2017   AST 24 03/31/2017   ALT 18 03/31/2017   ALKPHOS 97 03/31/2017   BILITOT 0.4 03/31/2017   GFRNONAA >60 03/31/2017   GFRAA >60 03/31/2017    Lab Results  Component Value Date   WBC 4.7 03/31/2017   NEUTROABS 2.0 03/31/2017   HGB 9.6 (L) 03/31/2017   HCT 27.3 (L) 03/31/2017   MCV 97.6 03/31/2017   PLT 153 03/31/2017     STUDIES: Ct Abdomen Pelvis W Contrast  Result Date: 03/06/2017 CLINICAL DATA:  Ovarian cancer, status post surgery and chemotherapy. Restaging  assessment. EXAM: CT ABDOMEN AND PELVIS WITH CONTRAST TECHNIQUE: Multidetector CT imaging of  the abdomen and pelvis was performed using the standard protocol following bolus administration of intravenous contrast. CONTRAST:  138mL ISOVUE-300 IOPAMIDOL (ISOVUE-300) INJECTION 61% COMPARISON:  11/30/2016 FINDINGS: Lower chest: Stable 0.4 cm pleural-based nodule along the right middle lobe, image 5/4. Mild right middle lobe and right lower lobe atelectasis along the right hemidiaphragm. Descending thoracic aortic atherosclerotic calcification. Hepatobiliary: Cholecystectomy. Extrahepatic biliary dilatation with the CBD up to 1.4 cm, stable. Mild intrahepatic biliary dilatation. Pancreas: Unremarkable Spleen: Unremarkable Adrenals/Urinary Tract: Unremarkable Stomach/Bowel: Prominent stool throughout the colon favors constipation. Wall thickening in the sigmoid colon extends to the level of the upper rectum. Vascular/Lymphatic: Aortoiliac atherosclerotic vascular disease. No pathologic adenopathy identified. Reproductive: Hysterectomy and prior oophorectomy. No appreciable recurrent mass along the vaginal cuff. Other: Prior omentectomy. A small nodule along the left upper quadrant omentum on image 41/2 measures 0.6 by 0.4 cm, previously 1.0 by 0.8 cm by my measurement. No new omental or peritoneal nodularity. Results pelvic fluid collection. Musculoskeletal: New posterolateral rod and pedicle screw fixation at L5-S1 with posterior decompression. Lumbar spondylosis and degenerative disc disease noted with mild lumbar scoliosis. Mild degrees of impingement noted at L2-3. IMPRESSION: 1. Improvement in the minimal residual nodularity along the left upper quadrant omentum, compatible with response to therapy. 2. The pelvic fluid collection has resolved. 3. Chronic biliary dilatation, much of which is likely a physiologic response to cholecystectomy. 4. Wall thickening in the sigmoid colon, favoring low-grade colitis. This  extends to the upper rectum. 5.  Prominent stool throughout the colon favors constipation. 6. Other imaging findings of potential clinical significance: Aortic Atherosclerosis (ICD10-I70.0). Mild impingement at L2-3. Stable 4 mm pleural-based nodule in the right middle lobe merits surveillance. Electronically Signed   By: Van Clines M.D.   On: 03/06/2017 11:58   Mm Screening Breast Tomo Bilateral  Result Date: 03/12/2017 CLINICAL DATA:  Screening. EXAM: 2D DIGITAL SCREENING BILATERAL MAMMOGRAM WITH CAD AND ADJUNCT TOMO COMPARISON:  Previous exam(s). ACR Breast Density Category b: There are scattered areas of fibroglandular density. FINDINGS: There are no findings suspicious for malignancy. Images were processed with CAD. IMPRESSION: No mammographic evidence of malignancy. A result letter of this screening mammogram will be mailed directly to the patient. RECOMMENDATION: Screening mammogram in one year. (Code:SM-B-01Y) BI-RADS CATEGORY  1: Negative. Electronically Signed   By: Marin Olp M.D.   On: 03/12/2017 14:20    ASSESSMENT: Stage IIIc high-grade serous ovarian carcinoma  PLAN:  1. Stage IIIc high-grade serous ovarian carcinoma:  She underwent debulking surgery at [facility] on November 19, 2016. She was also initiated on a clinical trial and has received preoperative infusion of Keytruda on November 11, 2016. Proceed with cycle 6 of 6 of adjuvant carboplatin and Taxol today. Continue with Neulasta support. Patient's CA-125 has decreased significantly and now is within normal limits at 32.4.  It is unclear if patient will require maintenance Keytruda on clinical trial.  Return to clinic in 4 weeks with repeat imaging and further evaluation.  Patient has also been instructed to keep her follow-up with gynecology oncology as scheduled.  2. Wound healing: Resolved. Proceed with treatment as above.  3. Pain: Patient on chronic narcotics from previous back surgeries. Talked at length with  the patient and it was agreed upon that oncology would manage her pain while she is on chemotherapy, but then once chemotherapy is completed, will refer her pain management back to her primary care physician.  Continue fentanyl patch to 50 mcg every 72 hours.  Continue Percocet  and Flexeril as needed.   5. Diarrhea: Resolved. Continue Imodium as needed. 6. Hypokalemia: Resolved. 7. Anxiety: Patient reported increased anxiety for 24-48 hrs. after treatment, but has asked to keep dexamethasone in her treatment plan secondary to her joint pain and swelling. 8. Joint pain and swelling: Dexamethasone as above. Patient was also instructed should she can take ibuprofen sparingly. 9.  Pneumonia: Continue Levaquin as prescribed. 10.  Lower extremity edema: Continue compression stockings during the day and elevation at night.   Patient expressed understanding and was in agreement with this plan. She also understands that She can call clinic at any time with any questions, concerns, or complaints.   Cancer Staging Ovarian cancer, unspecified laterality ([place]) Staging form: Ovary, Fallopian Tube, and Primary Peritoneal Carcinoma, AJCC 8th Edition - Clinical: No stage assigned - Unsigned   Lloyd Huger, MD   04/01/2017 1:09 PM

## 2017-03-31 ENCOUNTER — Inpatient Hospital Stay (HOSPITAL_BASED_OUTPATIENT_CLINIC_OR_DEPARTMENT_OTHER): Payer: PPO | Admitting: Oncology

## 2017-03-31 ENCOUNTER — Inpatient Hospital Stay: Payer: PPO

## 2017-03-31 ENCOUNTER — Inpatient Hospital Stay: Payer: PPO | Attending: Oncology

## 2017-03-31 VITALS — BP 120/69 | HR 94 | Temp 97.7°F | Resp 18 | Wt 167.0 lb

## 2017-03-31 DIAGNOSIS — C7960 Secondary malignant neoplasm of unspecified ovary: Secondary | ICD-10-CM

## 2017-03-31 DIAGNOSIS — Z5111 Encounter for antineoplastic chemotherapy: Secondary | ICD-10-CM | POA: Diagnosis not present

## 2017-03-31 DIAGNOSIS — Z87891 Personal history of nicotine dependence: Secondary | ICD-10-CM | POA: Insufficient documentation

## 2017-03-31 DIAGNOSIS — C569 Malignant neoplasm of unspecified ovary: Secondary | ICD-10-CM

## 2017-03-31 DIAGNOSIS — G629 Polyneuropathy, unspecified: Secondary | ICD-10-CM | POA: Diagnosis not present

## 2017-03-31 DIAGNOSIS — N898 Other specified noninflammatory disorders of vagina: Secondary | ICD-10-CM | POA: Diagnosis not present

## 2017-03-31 DIAGNOSIS — F419 Anxiety disorder, unspecified: Secondary | ICD-10-CM | POA: Diagnosis not present

## 2017-03-31 DIAGNOSIS — Z5189 Encounter for other specified aftercare: Secondary | ICD-10-CM | POA: Insufficient documentation

## 2017-03-31 LAB — COMPREHENSIVE METABOLIC PANEL
ALK PHOS: 97 U/L (ref 38–126)
ALT: 18 U/L (ref 14–54)
AST: 24 U/L (ref 15–41)
Albumin: 3.4 g/dL — ABNORMAL LOW (ref 3.5–5.0)
Anion gap: 9 (ref 5–15)
BUN: 10 mg/dL (ref 6–20)
CALCIUM: 8.7 mg/dL — AB (ref 8.9–10.3)
CHLORIDE: 101 mmol/L (ref 101–111)
CO2: 28 mmol/L (ref 22–32)
CREATININE: 0.67 mg/dL (ref 0.44–1.00)
GFR calc Af Amer: >60 mL/min (ref >60)
Glucose, Bld: 95 mg/dL (ref 65–99)
Potassium: 3.5 mmol/L (ref 3.5–5.1)
Sodium: 138 mmol/L (ref 135–145)
Total Bilirubin: 0.4 mg/dL (ref 0.3–1.2)
Total Protein: 6.6 g/dL (ref 6.5–8.1)

## 2017-03-31 LAB — CBC WITH DIFFERENTIAL/PLATELET
BASOS PCT: 1 %
Basophils Absolute: 0 10*3/uL (ref 0–0.1)
EOS ABS: 0 10*3/uL (ref 0–0.7)
EOS PCT: 1 %
HCT: 27.3 % — ABNORMAL LOW (ref 35.0–47.0)
Hemoglobin: 9.6 g/dL — ABNORMAL LOW (ref 12.0–16.0)
Lymphocytes Relative: 43 %
Lymphs Abs: 2 10*3/uL (ref 1.0–3.6)
MCH: 34.2 pg — ABNORMAL HIGH (ref 26.0–34.0)
MCHC: 35 g/dL (ref 32.0–36.0)
MCV: 97.6 fL (ref 80.0–100.0)
MONOS PCT: 13 %
Monocytes Absolute: 0.6 10*3/uL (ref 0.2–0.9)
Neutro Abs: 2 10*3/uL (ref 1.4–6.5)
Neutrophils Relative %: 42 %
Platelets: 153 10*3/uL (ref 150–440)
RBC: 2.8 MIL/uL — ABNORMAL LOW (ref 3.80–5.20)
RDW: 18.7 % — ABNORMAL HIGH (ref 11.5–14.5)
WBC: 4.7 10*3/uL (ref 3.6–11.0)

## 2017-03-31 MED ORDER — CYCLOBENZAPRINE HCL 10 MG PO TABS: ORAL_TABLET | ORAL | 0 refills | Status: DC

## 2017-03-31 MED ORDER — SODIUM CHLORIDE 0.9 % IV SOLN
Freq: Once | INTRAVENOUS | Status: AC
  Administered 2017-03-31: 10:00:00 via INTRAVENOUS
  Filled 2017-03-31: qty 1000

## 2017-03-31 MED ORDER — PALONOSETRON HCL INJECTION 0.25 MG/5ML
0.2500 mg | Freq: Once | INTRAVENOUS | Status: AC
  Administered 2017-03-31: 0.25 mg via INTRAVENOUS
  Filled 2017-03-31: qty 5

## 2017-03-31 MED ORDER — DEXAMETHASONE SODIUM PHOSPHATE 10 MG/ML IJ SOLN
10.0000 mg | Freq: Once | INTRAMUSCULAR | Status: AC
  Administered 2017-03-31: 10 mg via INTRAVENOUS
  Filled 2017-03-31: qty 1

## 2017-03-31 MED ORDER — FENTANYL 50 MCG/HR TD PT72: 50.0000 ug | MEDICATED_PATCH | TRANSDERMAL | 0 refills | Status: DC

## 2017-03-31 MED ORDER — HEPARIN SOD (PORK) LOCK FLUSH 100 UNIT/ML IV SOLN
500.0000 [IU] | Freq: Once | INTRAVENOUS | Status: AC | PRN
  Administered 2017-03-31: 500 [IU]
  Filled 2017-03-31: qty 5

## 2017-03-31 MED ORDER — DIPHENHYDRAMINE HCL 50 MG/ML IJ SOLN
25.0000 mg | Freq: Once | INTRAMUSCULAR | Status: AC
  Administered 2017-03-31: 25 mg via INTRAVENOUS
  Filled 2017-03-31: qty 1

## 2017-03-31 MED ORDER — PEGFILGRASTIM 6 MG/0.6ML ~~LOC~~ PSKT
6.0000 mg | PREFILLED_SYRINGE | Freq: Once | SUBCUTANEOUS | Status: AC
  Administered 2017-03-31: 6 mg via SUBCUTANEOUS
  Filled 2017-03-31: qty 0.6

## 2017-03-31 MED ORDER — SODIUM CHLORIDE 0.9 % IV SOLN: 175.0000 mg/m2 | Freq: Once | INTRAVENOUS | Status: DC

## 2017-03-31 MED ORDER — CARBOPLATIN CHEMO INJECTION 600 MG/60ML
701.4000 mg | Freq: Once | INTRAVENOUS | Status: AC
  Administered 2017-03-31: 700 mg via INTRAVENOUS
  Filled 2017-03-31: qty 70

## 2017-03-31 MED ORDER — SODIUM CHLORIDE 0.9% FLUSH
10.0000 mL | INTRAVENOUS | Status: DC | PRN
  Administered 2017-03-31: 10 mL
  Filled 2017-03-31: qty 10

## 2017-03-31 MED ORDER — FAMOTIDINE IN NACL 20-0.9 MG/50ML-% IV SOLN
20.0000 mg | Freq: Once | INTRAVENOUS | Status: AC
  Administered 2017-03-31: 20 mg via INTRAVENOUS
  Filled 2017-03-31: qty 50

## 2017-03-31 MED ORDER — SODIUM CHLORIDE 0.9 % IV SOLN
300.0000 mg | Freq: Once | INTRAVENOUS | Status: AC
  Administered 2017-03-31: 300 mg via INTRAVENOUS
  Filled 2017-03-31: qty 50

## 2017-03-31 MED ORDER — LORAZEPAM 2 MG/ML IJ SOLN
0.5000 mg | Freq: Once | INTRAMUSCULAR | Status: AC
  Administered 2017-03-31: 0.5 mg via INTRAVENOUS
  Filled 2017-03-31: qty 1

## 2017-03-31 NOTE — Progress Notes (Signed)
Patient is here for follow up, she has questions about her shortness of breath, CT scan results from 03/06/17. Ca 125 went up how does this effect her treatments. Has neuropathy in her hands and feet that is getting worse. She would like to ask about going to the dentist next week for bonding work. Needs refill on her Flexeril and her Fentanyl patch.

## 2017-03-31 NOTE — Progress Notes (Signed)
Dose of taxol reduced by 10% due to neurpathy to dose of 300mg  per MD

## 2017-04-01 LAB — CA 125: Cancer Antigen (CA) 125: 32.4 U/mL (ref 0.0–38.1)

## 2017-04-02 ENCOUNTER — Inpatient Hospital Stay (HOSPITAL_BASED_OUTPATIENT_CLINIC_OR_DEPARTMENT_OTHER): Payer: PPO | Admitting: Obstetrics and Gynecology

## 2017-04-02 VITALS — BP 117/71 | HR 92 | Temp 97.7°F | Resp 18 | Ht 62.0 in | Wt 165.1 lb

## 2017-04-02 DIAGNOSIS — C569 Malignant neoplasm of unspecified ovary: Secondary | ICD-10-CM

## 2017-04-02 DIAGNOSIS — Z5111 Encounter for antineoplastic chemotherapy: Secondary | ICD-10-CM | POA: Diagnosis not present

## 2017-04-02 NOTE — Progress Notes (Signed)
Vaginal discharge with yellow to clear fluid every day.

## 2017-04-02 NOTE — Progress Notes (Signed)
Gynecologic Oncology Consult Visit   Referring Provider: Dr Grayland Ormond  Chief Concern: stage IIIC ovarian cancer  Subjective:  Jessica Burgess is a 62 y.o. female who is seen in consultation for advanced ovarian cancer.  Had cycle 6 of carbo/taxol 2 days ago at [facility].  CA125 03/31/17 was 32.4 (priors since 10/18 are 55, 41, 108, 122) down from over 3000 at diagnosis. Patient tolerating treatment well. Does have alopecia. She has some fatigue, weakness, dyspnea, constipation, anorexia, back pain, leg swelling/numbness. Mild vaginal discharge.  History Jessica Burgess initially presented to Dr. Grayland Ormond on 10/21/16 with several month history of LLQ pain.    10/25/16:   CA-125 of 1,902; CEA of 2.2 (N), repeat CA125 3,257.  10/25/16:  CTAP -- Omental caking, greatest within the pelvis, highly suspicious for metastatic disease. No definite primary tumor identified. Possible tiny peritoneal nodules. Small amount of free pelvic fluid.  11/05/16: Diagnostic laparoscopy with biopsies.  Fagotti score 4. Path revealed metastatic high grade serous carcinoma. Decision was made to proceed with [employer] study and primary debulking surgery.  11/11/16: Pre-surgical dose of Pembrolizumab 285m IV  11/19/16: Exploratory laparotomy, bilateral salpingo-oopherectomy, omentectomy, tumor debulking, liver moblization, and argon beam ablation of tumor nodules on the right diaphragm and posterior cul-de-sac.  At the completion of the case, optimal debulking was achieved with all tumor nodules less than 1 cm in diameter.  final pathology confirmed high grade serous adenocarcinoma involving the omentum, bilateral tubes, pelvic peritoneum, sigmoid nodule.  STIC in the right fallopian tube.   12/01/2016 Post op course complicated by abdominal wall seroma.  CT shows new moderate amount of free fluid within the pelvis. There is no loculated or encapsulated fluid collection. There are multiple small fluid collections in the  anterior subcutaneous tissues. Representative areas measure 2.0 x 5.5 cm (series 2, image 71) and 2.2 x 3.4 cm (series 2, image 60).   Wound separation on 12/01/16 in ER_wet to dry dressing_healing via secondary intention.   9/20/2018_mediport placed  Genetic Risk Assessment:  Genetic testing with [facility] risk negative.  Problem List: Patient Active Problem List   Diagnosis Date Noted  . Genetic testing 12/18/2016  . Wound disruption, post-op, skin 12/06/2016  . Migraines 12/01/2016  . Postoperative seroma of subcutaneous tissue after non-dermatologic procedure 12/01/2016  . Transaminitis 12/01/2016  . Postoperative hypoxia 11/21/2016  . Anemia associated with acute blood loss 11/20/2016  . Ovarian cancer, unspecified laterality ([place] 11/12/2016  . 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[place] 11/05/2016  . Examination of participant in clinical trial 11/01/2016  . Chronic right-sided low back pain with right-sided sciatica 02/02/2016  . History of neck surgery 08/01/2015  . Hematoma 06/14/2015  . Perennial allergic rhinitis 05/05/2015  . Generalized anxiety disorder 02/03/2015  . Back pain, chronic 08/25/2014  . Insomnia, persistent 08/25/2014  . Chronic cervical pain 08/25/2014  . Major depression, chronic ([place] 08/25/2014  . Dyslipidemia 08/25/2014  . Gastro-esophageal reflux disease without esophagitis 08/25/2014  . H/O high risk medication treatment 08/25/2014  . Blood glucose elevated 08/25/2014  . Migraine without aura and without status migrainosus, not intractable 08/25/2014  . Climacteric 08/25/2014  . Dysmetabolic syndrome 086/57/8469 . Fungal infection of toenail 08/25/2014  . Obesity (BMI 30.0-34.9) 08/25/2014  . Vitamin D deficiency 08/25/2014  . Engages in travel abroad 08/25/2014  . Cervical disc disorder with radiculopathy 04/28/2013   Past Medical History: Past Medical History:  Diagnosis Date  . Allergic rhinitis, cause unspecified   .  Anxiety state, unspecified   . Arthritis   . Asthma    only when sick   . Backache, unspecified   . Bronchitis    hx of when get sick  . Cancer ([place])    skin cancer , basal cell   . Cancer ([place]) 11/2016   ovarian  . Cervicalgia   . Complication of anesthesia   . Dermatophytosis of nail   . Dysmetabolic syndrome X   . Encounter for long-term (current) use of other medications   . Esophageal reflux   . Insomnia, unspecified   . Leukocytosis, unspecified   . Migraine without aura, without mention of intractable migraine without mention of status migrainosus   . Other and unspecified hyperlipidemia   . Other malaise and fatigue   . Overweight(278.02)   . Personal history of chemotherapy now   ovarian  . PONV (postoperative nausea and vomiting)   . Spinal stenosis in cervical region   . Symptomatic menopausal or female climacteric states   . Unspecified disorder of skin and subcutaneous tissue   . Unspecified vitamin D deficiency     Past Surgical History: Past Surgical History:  Procedure Laterality Date  . ABDOMINAL HYSTERECTOMY    . ANTERIOR CERVICAL DECOMP/DISCECTOMY FUSION N/A 06/07/2015   Procedure: Cervical three - four and Cervical six- seven anterior cervical decompression with fusion interbody prosthesis plating and bonegraft;  Surgeon: Newman Pies, MD;  Location: [facility];  Service: Neurosurgery;  Laterality: N/A;  C34 and C67 anterior cervical decompression with fusion interbody prosthesis plating and bonegraft  . BACK SURGERY     x2 Lower   . EVACUATION OF CERVICAL HEMATOMA N/A 06/14/2015   Procedure: EVACUATION OF CERVICAL HEMATOMA;  Surgeon: Newman Pies, MD;  Location: [facility];  Service: Neurosurgery;  Laterality: N/A;  . NECK SURGERY     x3  . TONSILLECTOMY      OB History:  OB History  Gravida Para Term Preterm AB Living  0            SAB TAB Ectopic Multiple Live Births                  Family History: Family History  Problem  Relation Age of Onset  . Depression Mother   . Migraines Mother   . Dementia Father   . Hyperlipidemia Brother   . Breast cancer Paternal Aunt        54s    Social History: Social History   Socioeconomic History  . Marital status: Single    Spouse name: Not on file  . Number of children: 0  . Years of education: 12+  . Highest education level: Not on file  Social Needs  . Financial resource strain: Not on file  . Food insecurity - worry: Not on file  . Food insecurity - inability: Not on file  . Transportation needs - medical: Not on file  . Transportation needs - non-medical: Not on file  Occupational History    Employer: DISABLED  . Occupation: Disabled   Tobacco Use  . Smoking status: Former Smoker    Packs/day: 1.50    Years: 20.00    Pack years: 30.00    Types: Cigarettes    Start date: 03/19/1979    Last attempt to quit: 08/25/1999    Years since quitting: 17.6  . Smokeless tobacco: Never Used  Substance and Sexual Activity  . Alcohol use: Yes    Alcohol/week: 0.0 oz    Comment:  wine occassionally  . Drug use: No  . Sexual activity: No  Other Topics Concern  . Not on file  Social History Narrative   Patient is single.    Patient lives with roommates.    Patient on disability    Patient has no children.    Patient has some college     Allergies: No Known Allergies  Current Medications: Current Outpatient Medications  Medication Sig Dispense Refill  . albuterol (PROVENTIL HFA;VENTOLIN HFA) 108 (90 Base) MCG/ACT inhaler Inhale 2 puffs into the lungs every 6 (six) hours as needed for wheezing or shortness of breath. 1 Inhaler 2  . albuterol (PROVENTIL) (2.5 MG/3ML) 0.083% nebulizer solution Take 3 mLs (2.5 mg total) by nebulization every 6 (six) hours as needed for wheezing or shortness of breath. 75 mL 2  . ALPRAZolam (XANAX) 0.25 MG tablet Take 1 tablet (0.25 mg total) by mouth 2 (two) times daily as needed for anxiety. 60 tablet 0  . Ascorbic Acid  (VITAMIN C PO) Take by mouth.    Marland Kitchen atorvastatin (LIPITOR) 40 MG tablet take 1 tablet by mouth once daily 90 tablet 4  . budesonide-formoterol (SYMBICORT) 160-4.5 MCG/ACT inhaler Inhale 2 puffs into the lungs 2 (two) times daily.    . Cholecalciferol (VITAMIN D-1000 MAX ST) 1000 UNITS tablet Take 1 tablet by mouth 2 (two) times daily.    . Cyanocobalamin (B-12 PO) Take 1 tablet by mouth daily.     . cyclobenzaprine (FLEXERIL) 10 MG tablet take 1 tablet by mouth three times a day if needed for muscle spasm 30 tablet 0  . docusate sodium (COLACE) 100 MG capsule Take 1 capsule (100 mg total) by mouth 2 (two) times daily. 60 capsule 0  . DULoxetine (CYMBALTA) 60 MG capsule take 1 capsule by mouth once daily 90 capsule 0  . estradiol (ESTRACE) 0.5 MG tablet take 1 tablet by mouth once daily 90 tablet 4  . fentaNYL (DURAGESIC - DOSED MCG/HR) 50 MCG/HR Place 1 patch (50 mcg total) onto the skin every 3 (three) days. 10 patch 0  . ferrous sulfate (IRON SUPPLEMENT) 325 (65 FE) MG tablet Take 1 tablet by mouth daily.    . fluticasone (FLONASE) 50 MCG/ACT nasal spray Place 2 sprays into both nostrils as needed. 16 g 5  . gabapentin (NEURONTIN) 300 MG capsule Take 1 capsule (300 mg total) by mouth 3 (three) times daily. Titrate to up to 300 mg three times daily 270 capsule 0  . ipratropium (ATROVENT HFA) 17 MCG/ACT inhaler Inhale 2 puffs into the lungs every 6 (six) hours.    Marland Kitchen levothyroxine (SYNTHROID, LEVOTHROID) 50 MCG tablet Take 1 tablet by mouth daily.    Marland Kitchen lidocaine-prilocaine (EMLA) cream Apply to affected area once 30 g 3  . Magnesium Oxide 500 MG CAPS Take 500 mg by mouth 2 (two) times daily.     . montelukast (SINGULAIR) 10 MG tablet Take 10 mg by mouth at bedtime.    . Multiple Vitamins-Minerals (MULTIVITAMIN PO) Take 1 tablet by mouth daily.     . Omega-3 Fatty Acids (FISH OIL PO) Take by mouth.    . ondansetron (ZOFRAN) 8 MG tablet Take 1 tablet (8 mg total) by mouth 2 (two) times daily as  needed for refractory nausea / vomiting. 60 tablet 2  . oxyCODONE-acetaminophen (PERCOCET) 10-325 MG tablet Take 1 tablet by mouth every 8 (eight) hours as needed for pain. 90 tablet 0  . Polyethylene Glycol 3350 (PEG 3350) POWD Take by  mouth.    . potassium gluconate 595 MG TABS tablet Take 595 mg by mouth daily.    . prochlorperazine (COMPAZINE) 10 MG tablet Take 1 tablet (10 mg total) by mouth every 6 (six) hours as needed (Nausea or vomiting). 60 tablet 2  . SUMAtriptan (IMITREX) 100 MG tablet take 1 tablet by mouth if needed for migraines as directed 27 tablet 1  . zolpidem (AMBIEN CR) 6.25 MG CR tablet Take 1 tablet (6.25 mg total) at bedtime by mouth. 90 tablet 0   No current facility-administered medications for this visit.    Facility-Administered Medications Ordered in Other Visits  Medication Dose Route Frequency Provider Last Rate Last Dose  . 0.9 %  sodium chloride infusion   Intravenous Once Lloyd Huger, MD      . 0.9 %  sodium chloride infusion   Intravenous Once Lloyd Huger, MD      . dexamethasone (DECADRON) 20 mg in sodium chloride 0.9 % 50 mL IVPB  20 mg Intravenous Once Lloyd Huger, MD      . dexamethasone (DECADRON) injection 10 mg  10 mg Intravenous Once Lloyd Huger, MD        Review of Systems Per interval history  Objective:  Physical Examination:  BP 117/71   Pulse 92   Temp 97.7 F (36.5 C) (Tympanic)   Resp 18   Ht _0  (1.575 m)   Wt 165 lb 1.6 oz (74.9 kg)   BMI 30.20 kg/m    ECOG Performance Status: 1 - Symptomatic but completely ambulatory  General appearance: alert, cooperative and appears stated age HEENT:neck supple with midline trachea and thyroid without masses Lymph node survey: non-palpable, axillary, inguinal, supraclavicular Cardiovascular: regular rate and rhythm, no murmurs or gallops Respiratory: normal air entry, lungs clear to auscultation and no rales, rhonchi or wheezing Breast exam: not  examined. Abdomen: soft, non-tender, without masses or organomegaly, no hernias and well healed incision Back: inspection of back is normal Extremities: extremities normal, atraumatic, no cyanosis or edema Skin exam - normal coloration and turgor, no rashes, no suspicious skin lesions noted. Neurological exam reveals alert, oriented, normal speech, no focal findings or movement disorder noted.  Pelvic: exam chaperoned by nurse;  Vulva: normal appearing vulva with no masses, tenderness or lesions; Vagina: normal vagina no discharge;  Cervix: normal, Bimanua/RV: normal    Assessment:  Jessica Burgess is a 62 y.o. female diagnosed with IIIC high grade serous ovarian cancer.  Status post diagnostic laparoscopy and omental biopsy on 11/05/16.  Single infusion of Pembrolizumab 268m 11/11/16 on [employer] trial.   Optimal tumor debulking 11/19/16 and [facility]    CA125 still mildly elevated and levels still down trending.  [facility]    Medical co-morbidities complicating care:hyperlipidemia, extensive spinal surgery, anxiety/depression  .  Plan:   Problem List Items Addressed This Visit      Genitourinary   Ovarian cancer, unspecified laterality ([place]     Discussed with Dr FGrayland Ormondthat since CA125 is still mildly elevated and falling would give another 2-3 cycles of carbo/taxol chemotherapy.  Will get CT scan at time of next treatment to further assess response.    Patient has the option of taking maintenance Pebrolizumab after completing chemotherapy, but [facility]  Patient is unsure if she wants to pursue this or not.   Will see her back for  follow up when she finishes chemotherapy.    Mellody Drown, MD  CC:  Steele Sizer, [place] [address] [place], [place] [address] [phone]

## 2017-04-14 DIAGNOSIS — X32XXXA Exposure to sunlight, initial encounter: Secondary | ICD-10-CM | POA: Diagnosis not present

## 2017-04-14 DIAGNOSIS — L570 Actinic keratosis: Secondary | ICD-10-CM | POA: Diagnosis not present

## 2017-04-16 ENCOUNTER — Ambulatory Visit
Admission: RE | Admit: 2017-04-16 | Discharge: 2017-04-16 | Disposition: A | Discharge status: Home / Self Care | Payer: PPO | Source: Ambulatory Visit | Attending: Oncology | Admitting: Oncology

## 2017-04-16 ENCOUNTER — Ambulatory Visit: Admission: RE | Admit: 2017-04-16 | Payer: PPO | Source: Ambulatory Visit

## 2017-04-16 DIAGNOSIS — C569 Malignant neoplasm of unspecified ovary: Secondary | ICD-10-CM | POA: Insufficient documentation

## 2017-04-16 DIAGNOSIS — R918 Other nonspecific abnormal finding of lung field: Secondary | ICD-10-CM | POA: Diagnosis not present

## 2017-04-16 DIAGNOSIS — I700 Atherosclerosis of aorta: Secondary | ICD-10-CM | POA: Insufficient documentation

## 2017-04-16 DIAGNOSIS — R933 Abnormal findings on diagnostic imaging of other parts of digestive tract: Secondary | ICD-10-CM | POA: Diagnosis not present

## 2017-04-16 MED ORDER — IOPAMIDOL (ISOVUE-370) INJECTION 76%
85.0000 mL | Freq: Once | INTRAVENOUS | Status: AC | PRN
  Administered 2017-04-16: 85 mL via INTRAVENOUS

## 2017-04-16 MED ORDER — IOPAMIDOL (ISOVUE-300) INJECTION 61%: 100.0000 mL | Freq: Once | INTRAVENOUS | Status: DC | PRN

## 2017-04-18 DIAGNOSIS — M7552 Bursitis of left shoulder: Secondary | ICD-10-CM | POA: Diagnosis not present

## 2017-04-18 DIAGNOSIS — M25512 Pain in left shoulder: Secondary | ICD-10-CM | POA: Diagnosis not present

## 2017-04-18 DIAGNOSIS — M7542 Impingement syndrome of left shoulder: Secondary | ICD-10-CM | POA: Diagnosis not present

## 2017-04-21 ENCOUNTER — Inpatient Hospital Stay (HOSPITAL_BASED_OUTPATIENT_CLINIC_OR_DEPARTMENT_OTHER): Payer: PPO | Admitting: Nurse Practitioner

## 2017-04-21 ENCOUNTER — Other Ambulatory Visit: Payer: Self-pay

## 2017-04-21 ENCOUNTER — Encounter: Payer: Self-pay | Admitting: Nurse Practitioner

## 2017-04-21 ENCOUNTER — Inpatient Hospital Stay: Payer: PPO | Attending: Nurse Practitioner

## 2017-04-21 ENCOUNTER — Inpatient Hospital Stay: Payer: PPO

## 2017-04-21 VITALS — BP 119/73 | HR 75 | Temp 97.3°F | Resp 18 | Wt 159.2 lb

## 2017-04-21 DIAGNOSIS — C569 Malignant neoplasm of unspecified ovary: Secondary | ICD-10-CM | POA: Insufficient documentation

## 2017-04-21 DIAGNOSIS — F419 Anxiety disorder, unspecified: Secondary | ICD-10-CM | POA: Diagnosis not present

## 2017-04-21 DIAGNOSIS — M25512 Pain in left shoulder: Secondary | ICD-10-CM | POA: Diagnosis not present

## 2017-04-21 DIAGNOSIS — Z5111 Encounter for antineoplastic chemotherapy: Secondary | ICD-10-CM | POA: Insufficient documentation

## 2017-04-21 DIAGNOSIS — G629 Polyneuropathy, unspecified: Secondary | ICD-10-CM | POA: Diagnosis not present

## 2017-04-21 DIAGNOSIS — E876 Hypokalemia: Secondary | ICD-10-CM

## 2017-04-21 DIAGNOSIS — M7552 Bursitis of left shoulder: Secondary | ICD-10-CM | POA: Diagnosis not present

## 2017-04-21 DIAGNOSIS — Z87891 Personal history of nicotine dependence: Secondary | ICD-10-CM | POA: Diagnosis not present

## 2017-04-21 DIAGNOSIS — R51 Headache: Secondary | ICD-10-CM | POA: Diagnosis not present

## 2017-04-21 DIAGNOSIS — C7960 Secondary malignant neoplasm of unspecified ovary: Secondary | ICD-10-CM

## 2017-04-21 DIAGNOSIS — M7542 Impingement syndrome of left shoulder: Secondary | ICD-10-CM | POA: Diagnosis not present

## 2017-04-21 LAB — CBC WITH DIFFERENTIAL/PLATELET
BASOS ABS: 0 10*3/uL (ref 0–0.1)
BASOS PCT: 1 %
EOS ABS: 0 10*3/uL (ref 0–0.7)
EOS PCT: 0 %
HCT: 27 % — ABNORMAL LOW (ref 35.0–47.0)
Hemoglobin: 9.6 g/dL — ABNORMAL LOW (ref 12.0–16.0)
LYMPHS PCT: 41 %
Lymphs Abs: 2.5 10*3/uL (ref 1.0–3.6)
MCH: 35.9 pg — ABNORMAL HIGH (ref 26.0–34.0)
MCHC: 35.5 g/dL (ref 32.0–36.0)
MCV: 101.2 fL — ABNORMAL HIGH (ref 80.0–100.0)
MONO ABS: 0.9 10*3/uL (ref 0.2–0.9)
Monocytes Relative: 15 %
Neutro Abs: 2.7 10*3/uL (ref 1.4–6.5)
Neutrophils Relative %: 43 %
PLATELETS: 82 10*3/uL — AB (ref 150–440)
RBC: 2.66 MIL/uL — AB (ref 3.80–5.20)
RDW: 17.9 % — AB (ref 11.5–14.5)
WBC: 6.2 10*3/uL (ref 3.6–11.0)

## 2017-04-21 LAB — COMPREHENSIVE METABOLIC PANEL
ALK PHOS: 92 U/L (ref 38–126)
ALT: 18 U/L (ref 14–54)
AST: 25 U/L (ref 15–41)
Albumin: 3.8 g/dL (ref 3.5–5.0)
Anion gap: 9 (ref 5–15)
BILIRUBIN TOTAL: 0.3 mg/dL (ref 0.3–1.2)
BUN: 20 mg/dL (ref 6–20)
CALCIUM: 8.7 mg/dL — AB (ref 8.9–10.3)
CO2: 28 mmol/L (ref 22–32)
Chloride: 101 mmol/L (ref 101–111)
Creatinine, Ser: 0.71 mg/dL (ref 0.44–1.00)
GFR calc Af Amer: >60 mL/min (ref >60)
Glucose, Bld: 127 mg/dL — ABNORMAL HIGH (ref 65–99)
POTASSIUM: 3.4 mmol/L — AB (ref 3.5–5.1)
Sodium: 138 mmol/L (ref 135–145)
TOTAL PROTEIN: 6.8 g/dL (ref 6.5–8.1)

## 2017-04-21 MED ORDER — SODIUM CHLORIDE 0.9% FLUSH
10.0000 mL | Freq: Once | INTRAVENOUS | Status: AC
  Administered 2017-04-21: 10 mL via INTRAVENOUS
  Filled 2017-04-21: qty 10

## 2017-04-21 MED ORDER — HEPARIN SOD (PORK) LOCK FLUSH 100 UNIT/ML IV SOLN
500.0000 [IU] | Freq: Once | INTRAVENOUS | Status: AC
  Administered 2017-04-21: 500 [IU] via INTRAVENOUS
  Filled 2017-04-21: qty 5

## 2017-04-21 NOTE — Progress Notes (Signed)
Patient here today for follow up and treatment consideration regarding ovarian cancer. Patient reports she had cortisone injection in left shoulder, asking if chemo treatments could cause issues with bones.

## 2017-04-21 NOTE — Progress Notes (Signed)
[place]  [phone] [phone]  ID: Jessica Burgess OB: 11-07-1955  MR#: [id number]  YKD#:[id number]  Patient Care Team: Steele Sizer, MD as PCP - General (Family Medicine) Clent Jacks, RN as Registered Nurse  CHIEF COMPLAINT: Stage IIIc high-grade serous ovarian carcinoma  INTERVAL HISTORY: Patient returns to clinic today for further evaluation and consideration of cycle 7 of 8-9 of carboplatinum and Taxol.    She continues to have shortness of breath, that is chronic and unchanged.  She states her neuropathy in hands and feet has become worse over the past several weeks. She otherwise feels well. Her abdominal wound is completely healed. Her appetite has improved and she no longer has weakness or fatigue. She denies any recent fevers. She does not complain of chest pain, cough, or hemoptysis today. She denies any nausea, vomiting, constipation, or diarrhea. She has no urinary complaints. Patient offers no further specific complaints today.  REVIEW OF SYSTEMS:   Review of Systems  Constitutional: Negative.  Negative for fever, malaise/fatigue and weight loss.  HENT: Negative.   Respiratory: Negative for cough and shortness of breath.   Cardiovascular: Negative.  Negative for chest pain and leg swelling.  Gastrointestinal: Negative.  Negative for abdominal pain, blood in stool, constipation, diarrhea, melena, nausea and vomiting.  Genitourinary: Negative.   Musculoskeletal: Positive for back pain, joint pain and neck pain.  Skin: Negative.  Negative for rash.  Neurological: Positive for tingling, sensory change and headaches. Negative for weakness.  Psychiatric/Behavioral: The patient is nervous/anxious.     As per HPI. Otherwise, a complete review of systems is negative.  PAST MEDICAL HISTORY: Past Medical History:  Diagnosis Date  . Allergic rhinitis, cause unspecified   . Anxiety state, unspecified   . Arthritis   .  Asthma    only when sick   . Backache, unspecified   . Bronchitis    hx of when get sick  . Cancer ([place])    skin cancer , basal cell   . Cancer ([place]) 11/2016   ovarian  . Cervicalgia   . Complication of anesthesia   . Dermatophytosis of nail   . Dysmetabolic syndrome X   . Encounter for long-term (current) use of other medications   . Esophageal reflux   . Insomnia, unspecified   . Leukocytosis, unspecified   . Migraine without aura, without mention of intractable migraine without mention of status migrainosus   . Other and unspecified hyperlipidemia   . Other malaise and fatigue   . Overweight(278.02)   . Personal history of chemotherapy now   ovarian  . PONV (postoperative nausea and vomiting)   . Spinal stenosis in cervical region   . Symptomatic menopausal or female climacteric states   . Unspecified disorder of skin and subcutaneous tissue   . Unspecified vitamin D deficiency     PAST SURGICAL HISTORY: Past Surgical History:  Procedure Laterality Date  . ABDOMINAL HYSTERECTOMY    . ANTERIOR CERVICAL DECOMP/DISCECTOMY FUSION N/A 06/07/2015   Procedure: Cervical three - four and Cervical six- seven anterior cervical decompression with fusion interbody prosthesis plating and bonegraft;  Surgeon: Newman Pies, MD;  Location: [facility]UR[facility];  Service: Neurosurgery;  Laterality: N/A;  C34 and C67 anterior cervical decompression with fusion interbody prosthesis plating and bonegraft  . BACK SURGERY     x2 Lower   . EVACUATION OF CERVICAL HEMATOMA N/A 06/14/2015   Procedure: EVACUATION OF CERVICAL HEMATOMA;  Surgeon: Newman Pies, MD;  Location: [facility]  NEURO ORS;  Service: Neurosurgery;  Laterality: N/A;  . NECK SURGERY     x3  . TONSILLECTOMY      FAMILY HISTORY: Family History  Problem Relation Age of Onset  . Depression Mother   . Migraines Mother   . Dementia Father   . Hyperlipidemia Brother   . Breast cancer Paternal Aunt        44s    ADVANCED DIRECTIVES  (Y/N):  N  HEALTH MAINTENANCE: Social History   Tobacco Use  . Smoking status: Former Smoker    Packs/day: 1.50    Years: 20.00    Pack years: 30.00    Types: Cigarettes    Start date: 03/19/1979    Last attempt to quit: 08/25/1999    Years since quitting: 17.6  . Smokeless tobacco: Never Used  Substance Use Topics  . Alcohol use: Yes    Alcohol/week: 0.0 oz    Comment: wine occassionally  . Drug use: No     Colonoscopy:  PAP:  Bone density:  Lipid panel:  No Known Allergies  Current Outpatient Medications  Medication Sig Dispense Refill  . albuterol (PROVENTIL HFA;VENTOLIN HFA) 108 (90 Base) MCG/ACT inhaler Inhale 2 puffs into the lungs every 6 (six) hours as needed for wheezing or shortness of breath. 1 Inhaler 2  . albuterol (PROVENTIL) (2.5 MG/3ML) 0.083% nebulizer solution Take 3 mLs (2.5 mg total) by nebulization every 6 (six) hours as needed for wheezing or shortness of breath. 75 mL 2  . ALPRAZolam (XANAX) 0.25 MG tablet Take 1 tablet (0.25 mg total) by mouth 2 (two) times daily as needed for anxiety. 60 tablet 0  . Ascorbic Acid (VITAMIN C PO) Take by mouth.    Marland Kitchen atorvastatin (LIPITOR) 40 MG tablet take 1 tablet by mouth once daily 90 tablet 4  . budesonide-formoterol (SYMBICORT) 160-4.5 MCG/ACT inhaler Inhale 2 puffs into the lungs 2 (two) times daily.    . Cholecalciferol (VITAMIN D-1000 MAX ST) 1000 UNITS tablet Take 1 tablet by mouth 2 (two) times daily.    . Cyanocobalamin (B-12 PO) Take 1 tablet by mouth daily.     . cyclobenzaprine (FLEXERIL) 10 MG tablet take 1 tablet by mouth three times a day if needed for muscle spasm 30 tablet 0  . docusate sodium (COLACE) 100 MG capsule Take 1 capsule (100 mg total) by mouth 2 (two) times daily. 60 capsule 0  . DULoxetine (CYMBALTA) 60 MG capsule take 1 capsule by mouth once daily 90 capsule 0  . estradiol (ESTRACE) 0.5 MG tablet take 1 tablet by mouth once daily 90 tablet 4  . fentaNYL (DURAGESIC - DOSED MCG/HR) 50 MCG/HR  Place 1 patch (50 mcg total) onto the skin every 3 (three) days. 10 patch 0  . ferrous sulfate (IRON SUPPLEMENT) 325 (65 FE) MG tablet Take 1 tablet by mouth daily.    . fluticasone (FLONASE) 50 MCG/ACT nasal spray Place 2 sprays into both nostrils as needed. 16 g 5  . gabapentin (NEURONTIN) 300 MG capsule Take 1 capsule (300 mg total) by mouth 3 (three) times daily. Titrate to up to 300 mg three times daily 270 capsule 0  . ipratropium (ATROVENT HFA) 17 MCG/ACT inhaler Inhale 2 puffs into the lungs every 6 (six) hours.    Marland Kitchen levothyroxine (SYNTHROID, LEVOTHROID) 50 MCG tablet Take 1 tablet by mouth daily.    Marland Kitchen lidocaine-prilocaine (EMLA) cream Apply to affected area once 30 g 3  . Magnesium Oxide 500 MG CAPS Take 500  mg by mouth 2 (two) times daily.     . montelukast (SINGULAIR) 10 MG tablet Take 10 mg by mouth at bedtime.    . Multiple Vitamins-Minerals (MULTIVITAMIN PO) Take 1 tablet by mouth daily.     . Omega-3 Fatty Acids (FISH OIL PO) Take by mouth.    . ondansetron (ZOFRAN) 8 MG tablet Take 1 tablet (8 mg total) by mouth 2 (two) times daily as needed for refractory nausea / vomiting. 60 tablet 2  . oxyCODONE-acetaminophen (PERCOCET) 10-325 MG tablet Take 1 tablet by mouth every 8 (eight) hours as needed for pain. 90 tablet 0  . Polyethylene Glycol 3350 (PEG 3350) POWD Take by mouth.    . potassium gluconate 595 MG TABS tablet Take 595 mg by mouth daily.    . prochlorperazine (COMPAZINE) 10 MG tablet Take 1 tablet (10 mg total) by mouth every 6 (six) hours as needed (Nausea or vomiting). 60 tablet 2  . SUMAtriptan (IMITREX) 100 MG tablet take 1 tablet by mouth if needed for migraines as directed 27 tablet 1  . zolpidem (AMBIEN CR) 6.25 MG CR tablet Take 1 tablet (6.25 mg total) at bedtime by mouth. 90 tablet 0   No current facility-administered medications for this visit.    Facility-Administered Medications Ordered in Other Visits  Medication Dose Route Frequency Provider Last Rate Last  Dose  . 0.9 %  sodium chloride infusion   Intravenous Once Lloyd Huger, MD      . 0.9 %  sodium chloride infusion   Intravenous Once Lloyd Huger, MD      . dexamethasone (DECADRON) 20 mg in sodium chloride 0.9 % 50 mL IVPB  20 mg Intravenous Once Lloyd Huger, MD      . dexamethasone (DECADRON) injection 10 mg  10 mg Intravenous Once Lloyd Huger, MD        OBJECTIVE: Vitals:   04/21/17 0850  BP: 119/73  Pulse: 75  Resp: 18  Temp: (!) 97.3 F (36.3 C)     Body mass index is 29.12 kg/m.    ECOG FS:0 - Asymptomatic  General: Well-developed, well-nourished, no acute distress. Accompanied by guardian. Eyes: Pink conjunctiva, anicteric sclera. HEENT: Normocephalic, moist mucous membranes, clear oropharnyx. Lungs: Clear to auscultation bilaterally.  Heart: Regular rate and rhythm. No rubs, murmurs, or gallops. Abdomen: Surgical wound completely healed by secondary intention. Musculoskeletal: No edema, cyanosis, or clubbing. Neuro: Alert, answering all questions appropriately. Cranial nerves grossly intact. Skin: No rashes or petechiae noted. Psych: Normal affect.   LAB RESULTS:  Lab Results  Component Value Date   NA 138 04/21/2017   K 3.4 (L) 04/21/2017   CL 101 04/21/2017   CO2 28 04/21/2017   GLUCOSE 127 (H) 04/21/2017   BUN 20 04/21/2017   CREATININE 0.71 04/21/2017   CALCIUM 8.7 (L) 04/21/2017   PROT 6.8 04/21/2017   ALBUMIN 3.8 04/21/2017   AST 25 04/21/2017   ALT 18 04/21/2017   ALKPHOS 92 04/21/2017   BILITOT 0.3 04/21/2017   GFRNONAA >60 04/21/2017   GFRAA >60 04/21/2017    Lab Results  Component Value Date   WBC 6.2 04/21/2017   NEUTROABS 2.7 04/21/2017   HGB 9.6 (L) 04/21/2017   HCT 27.0 (L) 04/21/2017   MCV 101.2 (H) 04/21/2017   PLT 82 (L) 04/21/2017     STUDIES: Ct Chest W Contrast  Result Date: 04/16/2017 CLINICAL DATA:  Ovarian cancer. EXAM: CT CHEST, ABDOMEN, AND PELVIS WITH CONTRAST TECHNIQUE: Multidetector CT  imaging of the chest, abdomen and pelvis was performed following the standard protocol during bolus administration of intravenous contrast. CONTRAST:  74mL ISOVUE-370 IOPAMIDOL (ISOVUE-370) INJECTION 76% COMPARISON:  03/06/2017 FINDINGS: CT CHEST FINDINGS Cardiovascular: The heart size is normal. No pericardial effusion. Right Port-A-Cath tip is positioned in the mid SVC. Mediastinum/Nodes: No mediastinal lymphadenopathy. There is no hilar lymphadenopathy. The esophagus has normal imaging features. There is no axillary lymphadenopathy. Lungs/Pleura: 4 mm subpleural right middle lobe pulmonary nodule seen image 82 series 4. 2 mm perifissural nodule seen right lung on image 67. No focal airspace consolidation. No pulmonary edema or pleural effusion Musculoskeletal: Bone windows reveal no worrisome lytic or sclerotic osseous lesions. CT ABDOMEN PELVIS FINDINGS Hepatobiliary: No focal abnormality within the liver parenchyma. Gallbladder surgically absent. Common bile duct measures 11 mm in diameter, similar to prior study when it was measured at 14 mm. Pancreas: No focal mass lesion. No dilatation of the main duct. No intraparenchymal cyst. No peripancreatic edema. Spleen: No splenomegaly. No focal mass lesion. Adrenals/Urinary Tract: No adrenal nodule or mass. Kidneys are unremarkable. No evidence for hydroureter. The urinary bladder appears normal for the degree of distention. Stomach/Bowel: Stomach is nondistended. No gastric wall thickening. No evidence of outlet obstruction. Duodenum is normally positioned as is the ligament of Treitz. No small bowel wall thickening. No small bowel dilatation. The appendix is not visualized, but there is no edema or inflammation in the region of the cecum. No gross colonic mass. No colonic wall thickening. No substantial diverticular change. Vascular/Lymphatic: There is abdominal aortic atherosclerosis without aneurysm. There is no gastrohepatic or hepatoduodenal ligament  lymphadenopathy. No intraperitoneal or retroperitoneal lymphadenopathy. No pelvic sidewall lymphadenopathy. Reproductive: Uterus surgically absent.  There is no adnexal mass. Other: No intraperitoneal free fluid. Patient is status post omentectomy. Tiny soft tissue nodule in the anterior left upper quadrant measured previously has resolved in the interval. No mesenteric or peritoneal nodularity on the current study Musculoskeletal: Bone windows reveal no worrisome lytic or sclerotic osseous lesions. Status post lumbar fusion. IMPRESSION: 1. Interval resolution of the minimal residual nodularity identified in the left upper quadrant on the prior study. No new or progressive findings in the abdomen or pelvis on today's exam. 2. Tiny subpleural/perifissural right lung nodules. Right middle lobe subpleural nodule seen on prior study is unchanged. Unlikely to represent metastatic disease, but attention on follow-up recommended. 3. Sigmoid colon wall thickening seen previously has resolved in the interval. 4.  Aortic Atherosclerois (ICD10-170.0) Electronically Signed   By: Misty Stanley M.D.   On: 04/16/2017 12:25   Ct Abdomen Pelvis W Contrast  Result Date: 04/16/2017 CLINICAL DATA:  Ovarian cancer. EXAM: CT CHEST, ABDOMEN, AND PELVIS WITH CONTRAST TECHNIQUE: Multidetector CT imaging of the chest, abdomen and pelvis was performed following the standard protocol during bolus administration of intravenous contrast. CONTRAST:  17mL ISOVUE-370 IOPAMIDOL (ISOVUE-370) INJECTION 76% COMPARISON:  03/06/2017 FINDINGS: CT CHEST FINDINGS Cardiovascular: The heart size is normal. No pericardial effusion. Right Port-A-Cath tip is positioned in the mid SVC. Mediastinum/Nodes: No mediastinal lymphadenopathy. There is no hilar lymphadenopathy. The esophagus has normal imaging features. There is no axillary lymphadenopathy. Lungs/Pleura: 4 mm subpleural right middle lobe pulmonary nodule seen image 82 series 4. 2 mm perifissural  nodule seen right lung on image 67. No focal airspace consolidation. No pulmonary edema or pleural effusion Musculoskeletal: Bone windows reveal no worrisome lytic or sclerotic osseous lesions. CT ABDOMEN PELVIS FINDINGS Hepatobiliary: No focal abnormality within the liver parenchyma. Gallbladder surgically absent. Common bile  duct measures 11 mm in diameter, similar to prior study when it was measured at 14 mm. Pancreas: No focal mass lesion. No dilatation of the main duct. No intraparenchymal cyst. No peripancreatic edema. Spleen: No splenomegaly. No focal mass lesion. Adrenals/Urinary Tract: No adrenal nodule or mass. Kidneys are unremarkable. No evidence for hydroureter. The urinary bladder appears normal for the degree of distention. Stomach/Bowel: Stomach is nondistended. No gastric wall thickening. No evidence of outlet obstruction. Duodenum is normally positioned as is the ligament of Treitz. No small bowel wall thickening. No small bowel dilatation. The appendix is not visualized, but there is no edema or inflammation in the region of the cecum. No gross colonic mass. No colonic wall thickening. No substantial diverticular change. Vascular/Lymphatic: There is abdominal aortic atherosclerosis without aneurysm. There is no gastrohepatic or hepatoduodenal ligament lymphadenopathy. No intraperitoneal or retroperitoneal lymphadenopathy. No pelvic sidewall lymphadenopathy. Reproductive: Uterus surgically absent.  There is no adnexal mass. Other: No intraperitoneal free fluid. Patient is status post omentectomy. Tiny soft tissue nodule in the anterior left upper quadrant measured previously has resolved in the interval. No mesenteric or peritoneal nodularity on the current study Musculoskeletal: Bone windows reveal no worrisome lytic or sclerotic osseous lesions. Status post lumbar fusion. IMPRESSION: 1. Interval resolution of the minimal residual nodularity identified in the left upper quadrant on the prior  study. No new or progressive findings in the abdomen or pelvis on today's exam. 2. Tiny subpleural/perifissural right lung nodules. Right middle lobe subpleural nodule seen on prior study is unchanged. Unlikely to represent metastatic disease, but attention on follow-up recommended. 3. Sigmoid colon wall thickening seen previously has resolved in the interval. 4.  Aortic Atherosclerois (ICD10-170.0) Electronically Signed   By: Misty Stanley M.D.   On: 04/16/2017 12:25    ASSESSMENT: Stage IIIc high-grade serous ovarian carcinoma  PLAN:  1. Stage IIIc high-grade serous ovarian carcinoma:  She underwent debulking surgery at [facility] on November 19, 2016. She was also initiated on a clinical trial and has received preoperative infusion of Keytruda on November 11, 2016. She has completed 6 cycles of adjuvant carboplatin and taxol with Neulasta support. Her CA-125 has decreased significantly and is within normal limits. However, given that she has had a hysterectomy, would like to see her CA-125 reduce to < 15 as discussed with Dr. Fransisca Connors. I have independently reviewed images and results. CT 04/16/17 shows resolution of left upper quadrant nodularity without new or progressive findings in abdomen or pelvis. Lung nodules stable and unchanged and unlikely representative of metastatic disease. Sigmoid colon thickening resolved. Would like to complete an additional 2-3 cycles of carbo-taxol chemotherapy with neulasta onpro support. Patient given option with study to continue maintenance Keytruda after completing chemotherapy but this will have to be done at [facility]. Hold chemo today d/t thrombocytopenia. Re-evaluate in 1 week. Follow up with gynecology oncology as scheduled.  2. Thrombocytopenia- plt 82 today. Likely d/t chemotherapy. Hold treatment today. RTC in 1 week for repeat labs.   3. Wound healing: Resolved. Proceed with treatment as above.  4. Pain: Patient on chronic narcotics from previous back  surgeries. Previously discussed that oncology would manage pain while she is on chemotherapy, however once chemo has been completed we would refer her pain management back to PCP. Ok to continue fentanyl patch 50 mcg every 72 hours, percocet and flexeril prn. No issues with constipation at this time.  5. Diarrhea: Resolved. Continue Imodium as needed. 6. Hypokalemia: likely r/t diarrhea. Now resolved. 7. Anxiety:  Patient reported increased anxiety for 24-48 hrs. after treatment, but has asked to keep dexamethasone in her treatment plan secondary to her joint pain and swelling. Continue Xanax BID prn.  8. Joint pain and swelling: Dexamethasone as above. Patient was also instructed should she can take ibuprofen sparingly. 9.  Pneumonia: Resolved.  10.  Lower extremity edema: Continue compression stockings during the day and elevation at night.   Patient expressed understanding and was in agreement with this plan. She also understands that She can call clinic at any time with any questions, concerns, or complaints.   Cancer Staging Ovarian cancer, unspecified laterality ([place]) Staging form: Ovary, Fallopian Tube, and Primary Peritoneal Carcinoma, AJCC 8th Edition - Clinical: No stage assigned - Unsigned   Verlon Au, NP   04/21/2017 9:29 AM

## 2017-04-22 LAB — CA 125: Cancer Antigen (CA) 125: 32.8 U/mL (ref 0.0–38.1)

## 2017-04-23 ENCOUNTER — Other Ambulatory Visit: Payer: Self-pay | Admitting: Oncology

## 2017-04-23 ENCOUNTER — Other Ambulatory Visit: Payer: Self-pay | Admitting: Family Medicine

## 2017-04-23 DIAGNOSIS — M5441 Lumbago with sciatica, right side: Secondary | ICD-10-CM

## 2017-04-23 DIAGNOSIS — F411 Generalized anxiety disorder: Secondary | ICD-10-CM

## 2017-04-23 DIAGNOSIS — G8929 Other chronic pain: Secondary | ICD-10-CM

## 2017-04-23 DIAGNOSIS — M542 Cervicalgia: Secondary | ICD-10-CM

## 2017-04-23 DIAGNOSIS — F329 Major depressive disorder, single episode, unspecified: Secondary | ICD-10-CM

## 2017-04-23 DIAGNOSIS — G4700 Insomnia, unspecified: Secondary | ICD-10-CM

## 2017-04-25 ENCOUNTER — Ambulatory Visit (INDEPENDENT_AMBULATORY_CARE_PROVIDER_SITE_OTHER): Payer: PPO | Admitting: Family Medicine

## 2017-04-25 ENCOUNTER — Other Ambulatory Visit: Payer: Self-pay | Admitting: Oncology

## 2017-04-25 ENCOUNTER — Encounter: Payer: Self-pay | Admitting: Family Medicine

## 2017-04-25 ENCOUNTER — Ambulatory Visit: Payer: PPO

## 2017-04-25 ENCOUNTER — Other Ambulatory Visit: Payer: PPO

## 2017-04-25 VITALS — BP 122/68 | HR 95 | Temp 98.0°F | Resp 16 | Ht 63.0 in | Wt 157.9 lb

## 2017-04-25 DIAGNOSIS — G620 Drug-induced polyneuropathy: Secondary | ICD-10-CM | POA: Diagnosis not present

## 2017-04-25 DIAGNOSIS — E039 Hypothyroidism, unspecified: Secondary | ICD-10-CM

## 2017-04-25 DIAGNOSIS — G8929 Other chronic pain: Secondary | ICD-10-CM | POA: Diagnosis not present

## 2017-04-25 DIAGNOSIS — M542 Cervicalgia: Secondary | ICD-10-CM

## 2017-04-25 DIAGNOSIS — G4700 Insomnia, unspecified: Secondary | ICD-10-CM

## 2017-04-25 DIAGNOSIS — C569 Malignant neoplasm of unspecified ovary: Secondary | ICD-10-CM | POA: Diagnosis not present

## 2017-04-25 DIAGNOSIS — E876 Hypokalemia: Secondary | ICD-10-CM | POA: Diagnosis not present

## 2017-04-25 DIAGNOSIS — M5441 Lumbago with sciatica, right side: Secondary | ICD-10-CM

## 2017-04-25 DIAGNOSIS — M5442 Lumbago with sciatica, left side: Secondary | ICD-10-CM

## 2017-04-25 DIAGNOSIS — T451X5A Adverse effect of antineoplastic and immunosuppressive drugs, initial encounter: Secondary | ICD-10-CM

## 2017-04-25 DIAGNOSIS — D696 Thrombocytopenia, unspecified: Secondary | ICD-10-CM

## 2017-04-25 DIAGNOSIS — G43009 Migraine without aura, not intractable, without status migrainosus: Secondary | ICD-10-CM

## 2017-04-25 DIAGNOSIS — F411 Generalized anxiety disorder: Secondary | ICD-10-CM | POA: Diagnosis not present

## 2017-04-25 LAB — TSH: TSH: 2.37 mIU/L (ref 0.40–4.50)

## 2017-04-25 MED ORDER — ZOLPIDEM TARTRATE ER 6.25 MG PO TBCR: 6.2500 mg | EXTENDED_RELEASE_TABLET | Freq: Every day | ORAL | 0 refills | Status: DC

## 2017-04-25 MED ORDER — SUMATRIPTAN SUCCINATE 100 MG PO TABS: ORAL_TABLET | ORAL | 1 refills | Status: DC

## 2017-04-25 MED ORDER — GABAPENTIN 300 MG PO CAPS: 300.0000 mg | ORAL_CAPSULE | Freq: Three times a day (TID) | ORAL | 1 refills | Status: DC

## 2017-04-25 NOTE — Progress Notes (Signed)
[place]  [phone] [phone]) [phone]  ID: [name] OB: 09-03-1955  MR#: [id number]  HCW#:[id number]  Patient Care Team: Steele Sizer, MD as PCP - General (Family Medicine) Clent Jacks, RN as Registered Nurse  CHIEF COMPLAINT: Stage IIIc high-grade serous ovarian carcinoma  INTERVAL HISTORY: Patient returns to clinic today for further evaluation and consideration of additional 3 cycles of carboplatinum and Taxol per recommendation from Dr. Fransisca Connors.  She continues to complain of exertional shortness of breath for 1 week post chemotherapy.  Her neuropathy in hands and feet have become worse over the past few weeks.  She has been taking gabapentin as prescribed.  Her diarrhea is completely resolved.  She does not complain of joint pain or swelling today.  She does  complain of a mild headache this morning.  She otherwise feels well.  Her abdominal wound is completely healed.  Her appetite has improved tremendously this weekend "I eat everything and wanted to eat".  She denies any recent fevers, chest pain, cough or hemoptysis today.  She denies any nausea, vomiting or constipation.  She has no urinary complaints.  REVIEW OF SYSTEMS:   Review of Systems  Constitutional: Negative.  Negative for fever, malaise/fatigue and weight loss (Positive for weight gain).  Respiratory: Positive for shortness of breath (With exertion for 1 week post chemotherapy). Negative for cough.   Cardiovascular: Negative.  Negative for chest pain and leg swelling.  Gastrointestinal: Negative.  Negative for abdominal pain, blood in stool, constipation, diarrhea, melena, nausea and vomiting.  Genitourinary: Negative.   Musculoskeletal: Negative for back pain, joint pain and neck pain.  Skin: Negative.  Negative for rash.  Neurological: Positive for tingling and sensory change (This is worse). Negative for weakness.  Psychiatric/Behavioral: The patient is  nervous/anxious.     As per HPI. Otherwise, a complete review of systems is negative.  PAST MEDICAL HISTORY: Past Medical History:  Diagnosis Date  . Allergic rhinitis, cause unspecified   . Anxiety state, unspecified   . Arthritis   . Asthma    only when sick   . Backache, unspecified   . Bronchitis    hx of when get sick  . Cancer ([place])    skin cancer , basal cell   . Cancer ([place]) 11/2016   ovarian  . Cervicalgia   . Complication of anesthesia   . Dermatophytosis of nail   . Dysmetabolic syndrome X   . Encounter for long-term (current) use of other medications   . Esophageal reflux   . Insomnia, unspecified   . Leukocytosis, unspecified   . Migraine without aura, without mention of intractable migraine without mention of status migrainosus   . Other and unspecified hyperlipidemia   . Other malaise and fatigue   . Overweight(278.02)   . Personal history of chemotherapy now   ovarian  . PONV (postoperative nausea and vomiting)   . Spinal stenosis in cervical region   . Symptomatic menopausal or female climacteric states   . Unspecified disorder of skin and subcutaneous tissue   . Unspecified vitamin D deficiency     PAST SURGICAL HISTORY: Past Surgical History:  Procedure Laterality Date  . ABDOMINAL HYSTERECTOMY    . ANTERIOR CERVICAL DECOMP/DISCECTOMY FUSION N/A 06/07/2015   Procedure: Cervical three - four and Cervical six- seven anterior cervical decompression with fusion interbody prosthesis plating and bonegraft;  Surgeon: Newman Pies, MD;  Location: [place] [facility];  Service: Neurosurgery;  Laterality: N/A;  C34 and C67 anterior  cervical decompression with fusion interbody prosthesis plating and bonegraft  . BACK SURGERY     x2 Lower   . EVACUATION OF CERVICAL HEMATOMA N/A 06/14/2015   Procedure: EVACUATION OF CERVICAL HEMATOMA;  Surgeon: Newman Pies, MD;  Location: [place] [facility];  Service: Neurosurgery;  Laterality: N/A;  . NECK SURGERY     x3  .  TONSILLECTOMY      FAMILY HISTORY: Family History  Problem Relation Age of Onset  . Depression Mother   . Migraines Mother   . Dementia Father   . Hyperlipidemia Brother   . Breast cancer Paternal Aunt        67s    ADVANCED DIRECTIVES (Y/N):  N  HEALTH MAINTENANCE: Social History   Tobacco Use  . Smoking status: Former Smoker    Packs/day: 1.50    Years: 20.00    Pack years: 30.00    Types: Cigarettes    Start date: 03/19/1979    Last attempt to quit: 08/25/1999    Years since quitting: 17.6  . Smokeless tobacco: Never Used  Substance Use Topics  . Alcohol use: Yes    Alcohol/week: 0.0 oz    Comment: wine occassionally  . Drug use: No     Colonoscopy:  PAP:  Bone density:  Lipid panel:  No Known Allergies  Current Outpatient Medications  Medication Sig Dispense Refill  . albuterol (PROVENTIL HFA;VENTOLIN HFA) 108 (90 Base) MCG/ACT inhaler Inhale 2 puffs into the lungs every 6 (six) hours as needed for wheezing or shortness of breath. 1 Inhaler 2  . albuterol (PROVENTIL) (2.5 MG/3ML) 0.083% nebulizer solution Take 3 mLs (2.5 mg total) by nebulization every 6 (six) hours as needed for wheezing or shortness of breath. 75 mL 2  . ALPRAZolam (XANAX) 0.25 MG tablet Take 1 tablet (0.25 mg total) by mouth 2 (two) times daily as needed for anxiety. 60 tablet 0  . Ascorbic Acid (VITAMIN C PO) Take by mouth.    Marland Kitchen atorvastatin (LIPITOR) 40 MG tablet take 1 tablet by mouth once daily 90 tablet 4  . budesonide-formoterol (SYMBICORT) 160-4.5 MCG/ACT inhaler Inhale 2 puffs into the lungs 2 (two) times daily.    . Cholecalciferol (VITAMIN D-1000 MAX ST) 1000 UNITS tablet Take 1 tablet by mouth 2 (two) times daily.    . Cyanocobalamin (B-12 PO) Take 1 tablet by mouth daily.     . cyclobenzaprine (FLEXERIL) 10 MG tablet TAKE 1 TABLET BY MOUTH THREE TIMES A DAY IF NEEDED FOR MUSCLE SPASM 30 tablet 0  . docusate sodium (COLACE) 100 MG capsule Take 1 capsule (100 mg total) by mouth 2  (two) times daily. 60 capsule 0  . DULoxetine (CYMBALTA) 60 MG capsule TAKE 1 CAPSULE BY MOUTH ONCE DAILY 90 capsule 0  . estradiol (ESTRACE) 0.5 MG tablet take 1 tablet by mouth once daily 90 tablet 4  . fentaNYL (DURAGESIC - DOSED MCG/HR) 50 MCG/HR Place 1 patch (50 mcg total) onto the skin every 3 (three) days. 10 patch 0  . ferrous sulfate (IRON SUPPLEMENT) 325 (65 FE) MG tablet Take 1 tablet by mouth daily.    . fluticasone (FLONASE) 50 MCG/ACT nasal spray Place 2 sprays into both nostrils as needed. 16 g 5  . gabapentin (NEURONTIN) 300 MG capsule Take 1-3 capsules (300-900 mg total) by mouth 3 (three) times daily. 300 mg twice a day and 900 mg at night 540 capsule 1  . ipratropium (ATROVENT HFA) 17 MCG/ACT inhaler Inhale 2 puffs into the lungs  every 6 (six) hours.    Marland Kitchen levothyroxine (SYNTHROID, LEVOTHROID) 50 MCG tablet Take 1 tablet by mouth daily.    Marland Kitchen lidocaine-prilocaine (EMLA) cream Apply to affected area once 30 g 3  . Magnesium Oxide 500 MG CAPS Take 500 mg by mouth 2 (two) times daily.     . montelukast (SINGULAIR) 10 MG tablet Take 10 mg by mouth at bedtime.    . Multiple Vitamins-Minerals (MULTIVITAMIN PO) Take 1 tablet by mouth daily.     . Omega-3 Fatty Acids (FISH OIL PO) Take by mouth.    . ondansetron (ZOFRAN) 8 MG tablet Take 1 tablet (8 mg total) by mouth 2 (two) times daily as needed for refractory nausea / vomiting. 60 tablet 2  . oxyCODONE-acetaminophen (PERCOCET) 10-325 MG tablet Take 1 tablet by mouth every 8 (eight) hours as needed for pain. 90 tablet 0  . Polyethylene Glycol 3350 (PEG 3350) POWD Take by mouth.    . potassium gluconate 595 MG TABS tablet Take 595 mg by mouth daily.    . prochlorperazine (COMPAZINE) 10 MG tablet Take 1 tablet (10 mg total) by mouth every 6 (six) hours as needed (Nausea or vomiting). 60 tablet 2  . SUMAtriptan (IMITREX) 100 MG tablet take 1 tablet by mouth if needed for migraines as directed 27 tablet 1  . zolpidem (AMBIEN CR) 6.25 MG  CR tablet Take 1 tablet (6.25 mg total) by mouth at bedtime. 90 tablet 0   No current facility-administered medications for this visit.    Facility-Administered Medications Ordered in Other Visits  Medication Dose Route Frequency Provider Last Rate Last Dose  . 0.9 %  sodium chloride infusion   Intravenous Once Lloyd Huger, MD      . 0.9 %  sodium chloride infusion   Intravenous Once Lloyd Huger, MD      . CARBOplatin (PARAPLATIN) 700 mg in sodium chloride 0.9 % 250 mL chemo infusion  700 mg Intravenous Once Lloyd Huger, MD      . dexamethasone (DECADRON) 20 mg in sodium chloride 0.9 % 50 mL IVPB  20 mg Intravenous Once Lloyd Huger, MD      . dexamethasone (DECADRON) injection 10 mg  10 mg Intravenous Once Lloyd Huger, MD      . famotidine (PEPCID) IVPB 20 mg premix  20 mg Intravenous Once Lloyd Huger, MD   20 mg at 04/28/17 1025  . heparin lock flush 100 unit/mL  500 Units Intracatheter Once PRN Lloyd Huger, MD      . PACLitaxel (TAXOL) 330 mg in sodium chloride 0.9 % 500 mL chemo infusion (> 75m/m2)  175 mg/m2 (Treatment Plan Recorded) Intravenous Once FLloyd Huger MD      . [Margrett Rudpalonosetron (ALOXI) injection 0.25 mg  0.25 mg Intravenous Once FLloyd Huger MD   0.25 mg at 04/28/17 1020  . pegfilgrastim (NEULASTA ONPRO KIT) injection 6 mg  6 mg Subcutaneous Once FLloyd Huger MD      . sodium chloride flush (NS) 0.9 % injection 10 mL  10 mL Intravenous PRN FLloyd Huger MD      . sodium chloride flush (NS) 0.9 % injection 10 mL  10 mL Intracatheter PRN FLloyd Huger MD        OBJECTIVE: Vitals:   04/28/17 0858  BP: 128/78  Pulse: 98  Resp: 18  Temp: 97.7 F (36.5 C)     Body mass index is 29.26 kg/m.  ECOG FS:0 - Asymptomatic  General: Well-developed, well-nourished, no acute distress. Eyes: Pink conjunctiva, anicteric sclera. HEENT: Normocephalic, moist mucous membranes, clear  oropharnyx. Lungs: Rare scattered wheezing, otherwise clear bilaterally. Heart: Regular rate and rhythm. No rubs, murmurs, or gallops. Abdomen: Surgical wound completely healed by secondary intention. Musculoskeletal: No edema, cyanosis, or clubbing. Neuro: Alert, answering all questions appropriately. Cranial nerves grossly intact. Skin: No rashes or petechiae noted. Psych: Normal affect.   LAB RESULTS:  Lab Results  Component Value Date   NA 135 04/28/2017   K 3.9 04/28/2017   CL 101 04/28/2017   CO2 27 04/28/2017   GLUCOSE 141 (H) 04/28/2017   BUN 17 04/28/2017   CREATININE 0.77 04/28/2017   CALCIUM 8.7 (L) 04/28/2017   PROT 6.6 04/28/2017   ALBUMIN 3.8 04/28/2017   AST 26 04/28/2017   ALT 16 04/28/2017   ALKPHOS 94 04/28/2017   BILITOT 0.4 04/28/2017   GFRNONAA >60 04/28/2017   GFRAA >60 04/28/2017    Lab Results  Component Value Date   WBC 11.7 (H) 04/28/2017   NEUTROABS 9.4 (H) 04/28/2017   HGB 10.1 (L) 04/28/2017   HCT 28.7 (L) 04/28/2017   MCV 102.8 (H) 04/28/2017   PLT 159 04/28/2017     STUDIES: Ct Chest W Contrast  Result Date: 04/16/2017 CLINICAL DATA:  Ovarian cancer. EXAM: CT CHEST, ABDOMEN, AND PELVIS WITH CONTRAST TECHNIQUE: Multidetector CT imaging of the chest, abdomen and pelvis was performed following the standard protocol during bolus administration of intravenous contrast. CONTRAST:  37m ISOVUE-370 IOPAMIDOL (ISOVUE-370) INJECTION 76% COMPARISON:  03/06/2017 FINDINGS: CT CHEST FINDINGS Cardiovascular: The heart size is normal. No pericardial effusion. Right Port-A-Cath tip is positioned in the mid SVC. Mediastinum/Nodes: No mediastinal lymphadenopathy. There is no hilar lymphadenopathy. The esophagus has normal imaging features. There is no axillary lymphadenopathy. Lungs/Pleura: 4 mm subpleural right middle lobe pulmonary nodule seen image 82 series 4. 2 mm perifissural nodule seen right lung on image 67. No focal airspace consolidation. No  pulmonary edema or pleural effusion Musculoskeletal: Bone windows reveal no worrisome lytic or sclerotic osseous lesions. CT ABDOMEN PELVIS FINDINGS Hepatobiliary: No focal abnormality within the liver parenchyma. Gallbladder surgically absent. Common bile duct measures 11 mm in diameter, similar to prior study when it was measured at 14 mm. Pancreas: No focal mass lesion. No dilatation of the main duct. No intraparenchymal cyst. No peripancreatic edema. Spleen: No splenomegaly. No focal mass lesion. Adrenals/Urinary Tract: No adrenal nodule or mass. Kidneys are unremarkable. No evidence for hydroureter. The urinary bladder appears normal for the degree of distention. Stomach/Bowel: Stomach is nondistended. No gastric wall thickening. No evidence of outlet obstruction. Duodenum is normally positioned as is the ligament of Treitz. No small bowel wall thickening. No small bowel dilatation. The appendix is not visualized, but there is no edema or inflammation in the region of the cecum. No gross colonic mass. No colonic wall thickening. No substantial diverticular change. Vascular/Lymphatic: There is abdominal aortic atherosclerosis without aneurysm. There is no gastrohepatic or hepatoduodenal ligament lymphadenopathy. No intraperitoneal or retroperitoneal lymphadenopathy. No pelvic sidewall lymphadenopathy. Reproductive: Uterus surgically absent.  There is no adnexal mass. Other: No intraperitoneal free fluid. Patient is status post omentectomy. Tiny soft tissue nodule in the anterior left upper quadrant measured previously has resolved in the interval. No mesenteric or peritoneal nodularity on the current study Musculoskeletal: Bone windows reveal no worrisome lytic or sclerotic osseous lesions. Status post lumbar fusion. IMPRESSION: 1. Interval resolution of the minimal residual nodularity identified in the  left upper quadrant on the prior study. No new or progressive findings in the abdomen or pelvis on today's  exam. 2. Tiny subpleural/perifissural right lung nodules. Right middle lobe subpleural nodule seen on prior study is unchanged. Unlikely to represent metastatic disease, but attention on follow-up recommended. 3. Sigmoid colon wall thickening seen previously has resolved in the interval. 4.  Aortic Atherosclerois (ICD10-170.0) Electronically Signed   By: Misty Stanley M.D.   On: 04/16/2017 12:25   Ct Abdomen Pelvis W Contrast  Result Date: 04/16/2017 CLINICAL DATA:  Ovarian cancer. EXAM: CT CHEST, ABDOMEN, AND PELVIS WITH CONTRAST TECHNIQUE: Multidetector CT imaging of the chest, abdomen and pelvis was performed following the standard protocol during bolus administration of intravenous contrast. CONTRAST:  19m ISOVUE-370 IOPAMIDOL (ISOVUE-370) INJECTION 76% COMPARISON:  03/06/2017 FINDINGS: CT CHEST FINDINGS Cardiovascular: The heart size is normal. No pericardial effusion. Right Port-A-Cath tip is positioned in the mid SVC. Mediastinum/Nodes: No mediastinal lymphadenopathy. There is no hilar lymphadenopathy. The esophagus has normal imaging features. There is no axillary lymphadenopathy. Lungs/Pleura: 4 mm subpleural right middle lobe pulmonary nodule seen image 82 series 4. 2 mm perifissural nodule seen right lung on image 67. No focal airspace consolidation. No pulmonary edema or pleural effusion Musculoskeletal: Bone windows reveal no worrisome lytic or sclerotic osseous lesions. CT ABDOMEN PELVIS FINDINGS Hepatobiliary: No focal abnormality within the liver parenchyma. Gallbladder surgically absent. Common bile duct measures 11 mm in diameter, similar to prior study when it was measured at 14 mm. Pancreas: No focal mass lesion. No dilatation of the main duct. No intraparenchymal cyst. No peripancreatic edema. Spleen: No splenomegaly. No focal mass lesion. Adrenals/Urinary Tract: No adrenal nodule or mass. Kidneys are unremarkable. No evidence for hydroureter. The urinary bladder appears normal for the  degree of distention. Stomach/Bowel: Stomach is nondistended. No gastric wall thickening. No evidence of outlet obstruction. Duodenum is normally positioned as is the ligament of Treitz. No small bowel wall thickening. No small bowel dilatation. The appendix is not visualized, but there is no edema or inflammation in the region of the cecum. No gross colonic mass. No colonic wall thickening. No substantial diverticular change. Vascular/Lymphatic: There is abdominal aortic atherosclerosis without aneurysm. There is no gastrohepatic or hepatoduodenal ligament lymphadenopathy. No intraperitoneal or retroperitoneal lymphadenopathy. No pelvic sidewall lymphadenopathy. Reproductive: Uterus surgically absent.  There is no adnexal mass. Other: No intraperitoneal free fluid. Patient is status post omentectomy. Tiny soft tissue nodule in the anterior left upper quadrant measured previously has resolved in the interval. No mesenteric or peritoneal nodularity on the current study Musculoskeletal: Bone windows reveal no worrisome lytic or sclerotic osseous lesions. Status post lumbar fusion. IMPRESSION: 1. Interval resolution of the minimal residual nodularity identified in the left upper quadrant on the prior study. No new or progressive findings in the abdomen or pelvis on today's exam. 2. Tiny subpleural/perifissural right lung nodules. Right middle lobe subpleural nodule seen on prior study is unchanged. Unlikely to represent metastatic disease, but attention on follow-up recommended. 3. Sigmoid colon wall thickening seen previously has resolved in the interval. 4.  Aortic Atherosclerois (ICD10-170.0) Electronically Signed   By: EMisty StanleyM.D.   On: 04/16/2017 12:25    ASSESSMENT: Stage IIIc high-grade serous ovarian carcinoma  PLAN:  1. Stage IIIc high-grade serous ovarian carcinoma:  [facility]. She was also initiated on a clinical trial and has  received preoperative infusion of Keytruda on November 11, 2016.  Proceed with cycle  7 today.  Continue Neulasta support.  Her CA-125 has decreased significantly continues to decrease.  We will continue to monitor this. Labs from today have greatly improved from last week.  Platelet count is 159.  Electrolyte panel looks good. 2. Pain: Refilled narcotics. Spoke to patient about referring back to PCP for pain management at conclusion of therapy.  Continue fentanyl patch 50 mcg every 72 hours and as needed Percocet.  3. Diarrhea: Resolved. Continue Imodium as needed. 4. Anxiety: Refilled Xanax today.  She continues to be very anxious.  Spoke at length today about her anxiety.  Patient is very concerned about her cancer and is asking if chemotherapy is for "curative" intent.  Reassured patient that she is doing very well with chemotherapy.  5. Joint pain and swelling: No complaints of this today. 6.  Worsening peripheral neuropathy: Patient may need a decrease in chemotherapy.  We will consult with Dr. Grayland Ormond.  Patient's gabapentin is written to take 1-3 capsules 300-900 mg total by mouth 3 times daily and 900 mg at night.  She is currently only taking 300 mg in the morning and 900 mg at bedtime.  Encouraged patient to increase gabapentin to 600 mg in the morning and 600 mg at lunchtime.  Continue 900 mg at bedtime.  Patient does not complain of sedating side effects from this medication. 7. Exertional Shortness of breath X 1 week following chemo: Encouraged use of inhalers PRN to help with SOB.   Patient expressed understanding and was in agreement with this plan. She also understands that She can call clinic at any time with any questions, concerns, or complaints.   Cancer Staging Ovarian cancer, unspecified laterality ([place] ) Staging form: Ovary, Fallopian Tube, and Primary Peritoneal Carcinoma, AJCC 8th Edition - Clinical: No stage assigned - Unsigned   Jacquelin Hawking, NP   04/28/2017 10:13  AM

## 2017-04-25 NOTE — Patient Instructions (Signed)
Low platelets.

## 2017-04-25 NOTE — Progress Notes (Signed)
Name: Jessica Burgess   MRN: [id number]    DOB: 02/07/1956   Date:04/25/2017       Progress Note  Subjective  Chief Complaint  Chief Complaint  Patient presents with  . Medication Refill    Needs Refill on Zofran, Ambien, Gabapentin, Alprazolam, and Oxycodone.  . Insomnia    Sleeping well with Ambien  . Depression  . Hypothyroidism    Going regular with Miralax and Colace due to chronic pain medicationj    HPI   Ovarian cancer: history of hysterectomy for fibroid many years ago, developed acute left lower abdominal pain and saw her GI and CT showed abnormal omentum, suspicious for ovarian cancer. She had surgical debulking surgery, is under chemotherapy. Last platelets very low and had to skip therapy this week. She states abdominal pain is under control with Duragesic and takes oxycodone prn, currently being given by hematologist.   Chronic back pain; previous history of back surgery, still has daily pain. Took Fentanyl patch years ago but caused worsening of migraines at the time, however since placed back on medication by hematologist she is doing well, she also has been taking oxycodone prn, given 90 pills by oncologist in December. . She has been taking Percocet for many years. Explained risk of concomitant use of Ambien, BZD and Flexeril and she was off BZD and Flexeril, however back on medication since diagnosed with cancer. We tried stopping Ambien and switched to Trazodone, but she was unable to sleep for days, she is willing to try Ambien CR.   Insomnia: takes She is doing well on Ambien CR at this time  Depression Major : she is stable at this time, she had a set back shortly after cancer diagnosis, but is coping well at this time, has good support from friends and church members. Appetite is poor secondary to chemo  Metabolic Syndrome/Prediabetes: hgbA1C was down, she is on Metformin. She denies polyphagia, polyuria or polydipsia.   Cervical spine stenosis and  radiculitis: s/p neck ( last surgery Dr. Arnoldo Morale March 2017 ) surgery was zero at times, but it is getting worse again, aching like pain.   Migraine headache: she was doing well after neck surgery,  but is doing better, 6-8 episodes a month, taking Gabapentin, and seems to be helping.   Hypothyroidism: TSH was elevated and started on Synthroid 50 mcg last week by oncologist, no symptoms of hyperthyroidism at this time, we will recheck it today  Paresthesia: she states chemo is causing severe numbness on hands and feet, worse at night and oncologist suggested increasing dose of gabapentin, we will increase by 600 mg qhs.    Patient Active Problem List   Diagnosis Date Noted  . Genetic testing 12/18/2016  . Wound disruption, post-op, skin 12/06/2016  . Migraines 12/01/2016  . Postoperative seroma of subcutaneous tissue after non-dermatologic procedure 12/01/2016  . Transaminitis 12/01/2016  . Postoperative hypoxia 11/21/2016  . Anemia associated with acute blood loss 11/20/2016  . Ovarian cancer, unspecified laterality ([place]) 11/12/2016  . 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[place]) 11/05/2016  . Examination of participant in clinical trial 11/01/2016  . Chronic right-sided low back pain with right-sided sciatica 02/02/2016  . History of neck surgery 08/01/2015  . Hematoma 06/14/2015  . Perennial allergic rhinitis 05/05/2015  . Generalized anxiety disorder 02/03/2015  . Back pain, chronic 08/25/2014  . Insomnia, persistent 08/25/2014  . Chronic cervical pain 08/25/2014  . Major depression, chronic ([place]) 08/25/2014  . Dyslipidemia 08/25/2014  .  Gastro-esophageal reflux disease without esophagitis 08/25/2014  . H/O high risk medication treatment 08/25/2014  . Blood glucose elevated 08/25/2014  . Migraine without aura and without status migrainosus, not intractable 08/25/2014  . Climacteric 08/25/2014  . Dysmetabolic syndrome 97/98/9211  . Fungal infection of toenail  08/25/2014  . Obesity (BMI 30.0-34.9) 08/25/2014  . Vitamin D deficiency 08/25/2014  . Engages in travel abroad 08/25/2014  . Cervical disc disorder with radiculopathy 04/28/2013    Past Surgical History:  Procedure Laterality Date  . ABDOMINAL HYSTERECTOMY    . ANTERIOR CERVICAL DECOMP/DISCECTOMY FUSION N/A 06/07/2015   Procedure: Cervical three - four and Cervical six- seven anterior cervical decompression with fusion interbody prosthesis plating and bonegraft;  Surgeon: Newman Pies, MD;  Location: [facility];  Service: Neurosurgery;  Laterality: N/A;  C34 and C67 anterior cervical decompression with fusion interbody prosthesis plating and bonegraft  . BACK SURGERY     x2 Lower   . EVACUATION OF CERVICAL HEMATOMA N/A 06/14/2015   Procedure: EVACUATION OF CERVICAL HEMATOMA;  Surgeon: Newman Pies, MD;  Location: [facility];  Service: Neurosurgery;  Laterality: N/A;  . NECK SURGERY     x3  . TONSILLECTOMY      Family History  Problem Relation Age of Onset  . Depression Mother   . Migraines Mother   . Dementia Father   . Hyperlipidemia Brother   . Breast cancer Paternal Aunt        55s    Social History   Socioeconomic History  . Marital status: Single    Spouse name: Not on file  . Number of children: 0  . Years of education: 12+  . Highest education level: Not on file  Social Needs  . Financial resource strain: Not on file  . Food insecurity - worry: Not on file  . Food insecurity - inability: Not on file  . Transportation needs - medical: Not on file  . Transportation needs - non-medical: Not on file  Occupational History    Employer: DISABLED  . Occupation: Disabled   Tobacco Use  . Smoking status: Former Smoker    Packs/day: 1.50    Years: 20.00    Pack years: 30.00    Types: Cigarettes    Start date: 03/19/1979    Last attempt to quit: 08/25/1999    Years since quitting: 17.6  . Smokeless tobacco: Never Used  Substance and Sexual Activity  .  Alcohol use: Yes    Alcohol/week: 0.0 oz    Comment: wine occassionally  . Drug use: No  . Sexual activity: No  Other Topics Concern  . Not on file  Social History Narrative   Patient is single.    Patient lives with roommates.    Patient on disability    Patient has no children.    Patient has some college      Current Outpatient Medications:  .  albuterol (PROVENTIL HFA;VENTOLIN HFA) 108 (90 Base) MCG/ACT inhaler, Inhale 2 puffs into the lungs every 6 (six) hours as needed for wheezing or shortness of breath., Disp: 1 Inhaler, Rfl: 2 .  albuterol (PROVENTIL) (2.5 MG/3ML) 0.083% nebulizer solution, Take 3 mLs (2.5 mg total) by nebulization every 6 (six) hours as needed for wheezing or shortness of breath., Disp: 75 mL, Rfl: 2 .  ALPRAZolam (XANAX) 0.25 MG tablet, Take 1 tablet (0.25 mg total) by mouth 2 (two) times daily as needed for anxiety., Disp: 60 tablet, Rfl: 0 .  Ascorbic Acid (VITAMIN C  PO), Take by mouth., Disp: , Rfl:  .  atorvastatin (LIPITOR) 40 MG tablet, take 1 tablet by mouth once daily, Disp: 90 tablet, Rfl: 4 .  budesonide-formoterol (SYMBICORT) 160-4.5 MCG/ACT inhaler, Inhale 2 puffs into the lungs 2 (two) times daily., Disp: , Rfl:  .  Cholecalciferol (VITAMIN D-1000 MAX ST) 1000 UNITS tablet, Take 1 tablet by mouth 2 (two) times daily., Disp: , Rfl:  .  Cyanocobalamin (B-12 PO), Take 1 tablet by mouth daily. , Disp: , Rfl:  .  cyclobenzaprine (FLEXERIL) 10 MG tablet, TAKE 1 TABLET BY MOUTH THREE TIMES A DAY IF NEEDED FOR MUSCLE SPASM, Disp: 30 tablet, Rfl: 0 .  docusate sodium (COLACE) 100 MG capsule, Take 1 capsule (100 mg total) by mouth 2 (two) times daily., Disp: 60 capsule, Rfl: 0 .  DULoxetine (CYMBALTA) 60 MG capsule, TAKE 1 CAPSULE BY MOUTH ONCE DAILY, Disp: 90 capsule, Rfl: 0 .  estradiol (ESTRACE) 0.5 MG tablet, take 1 tablet by mouth once daily, Disp: 90 tablet, Rfl: 4 .  fentaNYL (DURAGESIC - DOSED MCG/HR) 50 MCG/HR, Place 1 patch (50 mcg total) onto the  skin every 3 (three) days., Disp: 10 patch, Rfl: 0 .  ferrous sulfate (IRON SUPPLEMENT) 325 (65 FE) MG tablet, Take 1 tablet by mouth daily., Disp: , Rfl:  .  fluticasone (FLONASE) 50 MCG/ACT nasal spray, Place 2 sprays into both nostrils as needed., Disp: 16 g, Rfl: 5 .  gabapentin (NEURONTIN) 300 MG capsule, Take 1 capsule (300 mg total) by mouth 3 (three) times daily. Titrate to up to 300 mg three times daily, Disp: 270 capsule, Rfl: 0 .  ipratropium (ATROVENT HFA) 17 MCG/ACT inhaler, Inhale 2 puffs into the lungs every 6 (six) hours., Disp: , Rfl:  .  levothyroxine (SYNTHROID, LEVOTHROID) 50 MCG tablet, Take 1 tablet by mouth daily., Disp: , Rfl:  .  lidocaine-prilocaine (EMLA) cream, Apply to affected area once, Disp: 30 g, Rfl: 3 .  Magnesium Oxide 500 MG CAPS, Take 500 mg by mouth 2 (two) times daily. , Disp: , Rfl:  .  montelukast (SINGULAIR) 10 MG tablet, Take 10 mg by mouth at bedtime., Disp: , Rfl:  .  Multiple Vitamins-Minerals (MULTIVITAMIN PO), Take 1 tablet by mouth daily. , Disp: , Rfl:  .  Omega-3 Fatty Acids (FISH OIL PO), Take by mouth., Disp: , Rfl:  .  ondansetron (ZOFRAN) 8 MG tablet, Take 1 tablet (8 mg total) by mouth 2 (two) times daily as needed for refractory nausea / vomiting., Disp: 60 tablet, Rfl: 2 .  oxyCODONE-acetaminophen (PERCOCET) 10-325 MG tablet, Take 1 tablet by mouth every 8 (eight) hours as needed for pain., Disp: 90 tablet, Rfl: 0 .  Polyethylene Glycol 3350 (PEG 3350) POWD, Take by mouth., Disp: , Rfl:  .  potassium gluconate 595 MG TABS tablet, Take 595 mg by mouth daily., Disp: , Rfl:  .  prochlorperazine (COMPAZINE) 10 MG tablet, Take 1 tablet (10 mg total) by mouth every 6 (six) hours as needed (Nausea or vomiting)., Disp: 60 tablet, Rfl: 2 .  SUMAtriptan (IMITREX) 100 MG tablet, take 1 tablet by mouth if needed for migraines as directed, Disp: 27 tablet, Rfl: 1 .  zolpidem (AMBIEN CR) 6.25 MG CR tablet, Take 1 tablet (6.25 mg total) at bedtime by  mouth., Disp: 90 tablet, Rfl: 0 No current facility-administered medications for this visit.   Facility-Administered Medications Ordered in Other Visits:  .  0.9 %  sodium chloride infusion, , Intravenous, Once, Delight Hoh  J, MD .  0.9 %  sodium chloride infusion, , Intravenous, Once, Finnegan, Kathlene November, MD .  dexamethasone (DECADRON) 20 mg in sodium chloride 0.9 % 50 mL IVPB, 20 mg, Intravenous, Once, Finnegan, Kathlene November, MD .  dexamethasone (DECADRON) injection 10 mg, 10 mg, Intravenous, Once, Finnegan, Kathlene November, MD  No Known Allergies   ROS  Constitutional: Negative for fever, positive for weight change.  Respiratory: Negative for cough and shortness of breath.   Cardiovascular: Negative for chest pain or palpitations.  Gastrointestinal: Negative for abdominal pain, no bowel changes.  Musculoskeletal: Negative for gait problem or joint swelling.  Skin: Negative for rash.  Neurological: Negative for dizziness , positive for headache.  No other specific complaints in a complete review of systems (except as listed in HPI above).  Objective  Vitals:   04/25/17 1600  BP: 122/68  Pulse: 95  Resp: 16  Temp: 98 F (36.7 C)  TempSrc: Oral  SpO2: 96%  Weight: 157 lb 14.4 oz (71.6 kg)  Height: '5\' 3"'  (1.6 m)    Body mass index is 27.97 kg/m.  Physical Exam  Constitutional: Patient appears well-developed and well-nourished. Overweight.  No distress.  HEENT: head atraumatic, normocephalic, pupils equal and reactive to light,  neck scar well healed, no pain to touch , throat within normal limits Cardiovascular: Normal rate, regular rhythm and normal heart sounds.  No murmur heard. No BLE edema. Pulmonary/Chest: Effort normal and breath sounds normal. No respiratory distress. Abdominal: Soft.  There is no tenderness. Psychiatric: Patient has a normal mood and affect. behavior is normal. Judgment and thought content normal. Muscular skeletal: some pain during palpation of  lumbar spine, negative straight leg raise  Recent Results (from the past 2160 hour(s))  Comprehensive metabolic panel     Status: Abnormal   Collection Time: 01/27/17  8:54 AM  Result Value Ref Range   Sodium 138 135 - 145 mmol/L   Potassium 3.6 3.5 - 5.1 mmol/L   Chloride 100 (L) 101 - 111 mmol/L   CO2 27 22 - 32 mmol/L   Glucose, Bld 107 (H) 65 - 99 mg/dL   BUN 11 6 - 20 mg/dL   Creatinine, Ser 0.77 0.44 - 1.00 mg/dL   Calcium 8.8 (L) 8.9 - 10.3 mg/dL   Total Protein 6.6 6.5 - 8.1 g/dL   Albumin 3.4 (L) 3.5 - 5.0 g/dL   AST 29 15 - 41 U/L   ALT 30 14 - 54 U/L   Alkaline Phosphatase 121 38 - 126 U/L   Total Bilirubin 0.4 0.3 - 1.2 mg/dL   GFR calc non Af Amer >60 >60 mL/min   GFR calc Af Amer >60 >60 mL/min    Comment: (NOTE) The eGFR has been calculated using the CKD EPI equation. This calculation has not been validated in all clinical situations. eGFR's persistently <60 mL/min signify possible Chronic Kidney Disease.    Anion gap 11 5 - 15  CBC with Differential     Status: Abnormal   Collection Time: 01/27/17  8:54 AM  Result Value Ref Range   WBC 4.6 3.6 - 11.0 K/uL   RBC 3.64 (L) 3.80 - 5.20 MIL/uL   Hemoglobin 11.3 (L) 12.0 - 16.0 g/dL   HCT 32.8 (L) 35.0 - 47.0 %   MCV 90.1 80.0 - 100.0 fL   MCH 31.0 26.0 - 34.0 pg   MCHC 34.4 32.0 - 36.0 g/dL   RDW 15.5 (H) 11.5 - 14.5 %   Platelets 228  150 - 440 K/uL   Neutrophils Relative % 40 %   Neutro Abs 1.8 1.4 - 6.5 K/uL   Lymphocytes Relative 42 %   Lymphs Abs 1.9 1.0 - 3.6 K/uL   Monocytes Relative 14 %   Monocytes Absolute 0.6 0.2 - 0.9 K/uL   Eosinophils Relative 2 %   Eosinophils Absolute 0.1 0 - 0.7 K/uL   Basophils Relative 2 %   Basophils Absolute 0.1 0 - 0.1 K/uL  CA 125     Status: Abnormal   Collection Time: 01/27/17  8:54 AM  Result Value Ref Range   Cancer Antigen (CA) 125 108.3 (H) 0.0 - 38.1 U/mL    Comment: (NOTE) Roche ECLIA methodology Performed At: [facility] [place], [place] [id number] Rush Farmer MD ID:[id number]   Comprehensive metabolic panel     Status: Abnormal   Collection Time: 02/17/17  9:15 AM  Result Value Ref Range   Sodium 139 135 - 145 mmol/L   Potassium 3.7 3.5 - 5.1 mmol/L   Chloride 101 101 - 111 mmol/L   CO2 27 22 - 32 mmol/L   Glucose, Bld 155 (H) 65 - 99 mg/dL   BUN 9 6 - 20 mg/dL   Creatinine, Ser 0.90 0.44 - 1.00 mg/dL   Calcium 8.6 (L) 8.9 - 10.3 mg/dL   Total Protein 6.5 6.5 - 8.1 g/dL   Albumin 3.3 (L) 3.5 - 5.0 g/dL   AST 29 15 - 41 U/L   ALT 18 14 - 54 U/L   Alkaline Phosphatase 98 38 - 126 U/L   Total Bilirubin 0.4 0.3 - 1.2 mg/dL   GFR calc non Af Amer >60 >60 mL/min   GFR calc Af Amer >60 >60 mL/min    Comment: (NOTE) The eGFR has been calculated using the CKD EPI equation. This calculation has not been validated in all clinical situations. eGFR's persistently <60 mL/min signify possible Chronic Kidney Disease.    Anion gap 11 5 - 15  CBC with Differential     Status: Abnormal   Collection Time: 02/17/17  9:15 AM  Result Value Ref Range   WBC 6.7 3.6 - 11.0 K/uL   RBC 3.40 (L) 3.80 - 5.20 MIL/uL   Hemoglobin 10.8 (L) 12.0 - 16.0 g/dL   HCT 31.2 (L) 35.0 - 47.0 %   MCV 91.9 80.0 - 100.0 fL   MCH 31.8 26.0 - 34.0 pg   MCHC 34.6 32.0 - 36.0 g/dL   RDW 16.5 (H) 11.5 - 14.5 %   Platelets 301 150 - 440 K/uL   Neutrophils Relative % 63 %   Neutro Abs 4.2 1.4 - 6.5 K/uL   Lymphocytes Relative 25 %   Lymphs Abs 1.7 1.0 - 3.6 K/uL   Monocytes Relative 10 %   Monocytes Absolute 0.7 0.2 - 0.9 K/uL   Eosinophils Relative 1 %   Eosinophils Absolute 0.1 0 - 0.7 K/uL   Basophils Relative 1 %   Basophils Absolute 0.1 0 - 0.1 K/uL  CA 125     Status: Abnormal   Collection Time: 02/17/17  9:15 AM  Result Value Ref Range   Cancer Antigen (CA) 125 41.2 (H) 0.0 - 38.1 U/mL    Comment: (NOTE) Roche ECLIA methodology Performed At: [facility] [place], [place] [id number] Rush Farmer MD [id number]   CA 125     Status: Abnormal   Collection Time: 03/07/17 11:59 AM  Result Value Ref Range  Cancer Antigen (CA) 125 55.5 (H) 0.0 - 38.1 U/mL    Comment: (NOTE) Roche ECLIA methodology Performed At: [facility] [address] [place], [place] [id number] Rush Farmer MD PJ:[id number] Performed at [facility], [address] [place], [place] [address]   CBC with Differential     Status: Abnormal   Collection Time: 03/07/17 11:59 AM  Result Value Ref Range   WBC 5.2 3.6 - 11.0 K/uL   RBC 3.39 (L) 3.80 - 5.20 MIL/uL   Hemoglobin 11.0 (L) 12.0 - 16.0 g/dL   HCT 31.6 (L) 35.0 - 47.0 %   MCV 93.3 80.0 - 100.0 fL   MCH 32.5 26.0 - 34.0 pg   MCHC 34.8 32.0 - 36.0 g/dL   RDW 16.5 (H) 11.5 - 14.5 %   Platelets 115 (L) 150 - 440 K/uL   Neutrophils Relative % 59 %   Neutro Abs 3.1 1.4 - 6.5 K/uL   Lymphocytes Relative 28 %   Lymphs Abs 1.5 1.0 - 3.6 K/uL   Monocytes Relative 11 %   Monocytes Absolute 0.6 0.2 - 0.9 K/uL   Eosinophils Relative 1 %   Eosinophils Absolute 0.0 0 - 0.7 K/uL   Basophils Relative 1 %   Basophils Absolute 0.0 0 - 0.1 K/uL    Comment: Performed at [facility], [address] [place], [place] [address]  Comprehensive metabolic panel     Status: Abnormal   Collection Time: 03/07/17 11:59 AM  Result Value Ref Range   Sodium 135 135 - 145 mmol/L   Potassium 3.7 3.5 - 5.1 mmol/L   Chloride 100 (L) 101 - 111 mmol/L   CO2 26 22 - 32 mmol/L   Glucose, Bld 137 (H) 65 - 99 mg/dL   BUN 8 6 - 20 mg/dL   Creatinine, Ser 0.63 0.44 - 1.00 mg/dL   Calcium 8.6 (L) 8.9 - 10.3 mg/dL   Total Protein 6.2 (L) 6.5 - 8.1 g/dL   Albumin 3.2 (L) 3.5 - 5.0 g/dL   AST 31 15 - 41 U/L   ALT 21 14 - 54 U/L   Alkaline Phosphatase 111 38 - 126 U/L   Total Bilirubin 0.4 0.3 - 1.2 mg/dL   GFR calc non Af Amer >60 >60 mL/min   GFR calc Af Amer >60 >60 mL/min    Comment: (NOTE) The eGFR has been calculated using the CKD EPI  equation. This calculation has not been validated in all clinical situations. eGFR's persistently <60 mL/min signify possible Chronic Kidney Disease.    Anion gap 9 5 - 15    Comment: Performed at [facility], [address] [place], [place] [address]  CA 125     Status: None   Collection Time: 03/31/17  8:39 AM  Result Value Ref Range   Cancer Antigen (CA) 125 32.4 0.0 - 38.1 U/mL    Comment: (NOTE) Roche [employer]ostics Electrochemiluminescence Immunoassay (ECLIA) Values obtained with different assay methods or kits cannot be used interchangeably.  Results cannot be interpreted as absolute evidence of the presence or absence of malignant disease. Performed At: [facility] [place], [place] [id number] Rush Farmer MD [id number] Performed at [facility], [address] [place], [place] [address]   Comprehensive metabolic panel     Status: Abnormal   Collection Time: 03/31/17  8:39 AM  Result Value Ref Range   Sodium 138 135 - 145 mmol/L   Potassium 3.5 3.5 - 5.1 mmol/L  Chloride 101 101 - 111 mmol/L   CO2 28 22 - 32 mmol/L   Glucose, Bld 95 65 - 99 mg/dL   BUN 10 6 - 20 mg/dL   Creatinine, Ser 0.67 0.44 - 1.00 mg/dL   Calcium 8.7 (L) 8.9 - 10.3 mg/dL   Total Protein 6.6 6.5 - 8.1 g/dL   Albumin 3.4 (L) 3.5 - 5.0 g/dL   AST 24 15 - 41 U/L   ALT 18 14 - 54 U/L   Alkaline Phosphatase 97 38 - 126 U/L   Total Bilirubin 0.4 0.3 - 1.2 mg/dL   GFR calc non Af Amer >60 >60 mL/min   GFR calc Af Amer >60 >60 mL/min    Comment: (NOTE) The eGFR has been calculated using the CKD EPI equation. This calculation has not been validated in all clinical situations. eGFR's persistently <60 mL/min signify possible Chronic Kidney Disease.    Anion gap 9 5 - 15    Comment: Performed at [facility], [address] [place], [place] [address]  CBC with Differential     Status: Abnormal   Collection Time: 03/31/17  8:39 AM  Result Value Ref  Range   WBC 4.7 3.6 - 11.0 K/uL   RBC 2.80 (L) 3.80 - 5.20 MIL/uL   Hemoglobin 9.6 (L) 12.0 - 16.0 g/dL   HCT 27.3 (L) 35.0 - 47.0 %   MCV 97.6 80.0 - 100.0 fL   MCH 34.2 (H) 26.0 - 34.0 pg   MCHC 35.0 32.0 - 36.0 g/dL   RDW 18.7 (H) 11.5 - 14.5 %   Platelets 153 150 - 440 K/uL   Neutrophils Relative % 42 %   Neutro Abs 2.0 1.4 - 6.5 K/uL   Lymphocytes Relative 43 %   Lymphs Abs 2.0 1.0 - 3.6 K/uL   Monocytes Relative 13 %   Monocytes Absolute 0.6 0.2 - 0.9 K/uL   Eosinophils Relative 1 %   Eosinophils Absolute 0.0 0 - 0.7 K/uL   Basophils Relative 1 %   Basophils Absolute 0.0 0 - 0.1 K/uL    Comment: Performed at [facility], [place], [place] [address]  CA 125     Status: None   Collection Time: 04/21/17  8:01 AM  Result Value Ref Range   Cancer Antigen (CA) 125 32.8 0.0 - 38.1 U/mL    Comment: (NOTE) Roche Diagnostics Electrochemiluminescence Immunoassay (ECLIA) Values obtained with different assay methods or kits cannot be used interchangeably.  Results cannot be interpreted as absolute evidence of the presence or absence of malignant disease. Performed At: [facility] [place], [place] [id number] Rush Farmer MD TO:[id number] Performed at [facility] [address] [place], [place] [address]   Comprehensive metabolic panel     Status: Abnormal   Collection Time: 04/21/17  8:01 AM  Result Value Ref Range   Sodium 138 135 - 145 mmol/L   Potassium 3.4 (L) 3.5 - 5.1 mmol/L   Chloride 101 101 - 111 mmol/L   CO2 28 22 - 32 mmol/L   Glucose, Bld 127 (H) 65 - 99 mg/dL   BUN 20 6 - 20 mg/dL   Creatinine, Ser 0.71 0.44 - 1.00 mg/dL   Calcium 8.7 (L) 8.9 - 10.3 mg/dL   Total Protein 6.8 6.5 - 8.1 g/dL   Albumin 3.8 3.5 - 5.0 g/dL   AST 25 15 - 41 U/L   ALT 18 14 - 54 U/L   Alkaline Phosphatase  92 38 - 126 U/L   Total Bilirubin 0.3 0.3 - 1.2 mg/dL   GFR calc non Af Amer >60 >60 mL/min   GFR calc Af Amer >60 >60  mL/min    Comment: (NOTE) The eGFR has been calculated using the CKD EPI equation. This calculation has not been validated in all clinical situations. eGFR's persistently <60 mL/min signify possible Chronic Kidney Disease.    Anion gap 9 5 - 15    Comment: Performed at [facility], [address] [place], [place] [address]  CBC with Differential     Status: Abnormal   Collection Time: 04/21/17  8:01 AM  Result Value Ref Range   WBC 6.2 3.6 - 11.0 K/uL   RBC 2.66 (L) 3.80 - 5.20 MIL/uL   Hemoglobin 9.6 (L) 12.0 - 16.0 g/dL   HCT 27.0 (L) 35.0 - 47.0 %   MCV 101.2 (H) 80.0 - 100.0 fL   MCH 35.9 (H) 26.0 - 34.0 pg   MCHC 35.5 32.0 - 36.0 g/dL   RDW 17.9 (H) 11.5 - 14.5 %   Platelets 82 (L) 150 - 440 K/uL   Neutrophils Relative % 43 %   Neutro Abs 2.7 1.4 - 6.5 K/uL   Lymphocytes Relative 41 %   Lymphs Abs 2.5 1.0 - 3.6 K/uL   Monocytes Relative 15 %   Monocytes Absolute 0.9 0.2 - 0.9 K/uL   Eosinophils Relative 0 %   Eosinophils Absolute 0.0 0 - 0.7 K/uL   Basophils Relative 1 %   Basophils Absolute 0.0 0 - 0.1 K/uL    Comment: Performed at [facility], [address] [place], [place] [address]     PHQ2/9: Depression screen [facility] 2/9 04/25/2017 07/24/2016 05/06/2016 02/02/2016 11/01/2015  Decreased Interest 0 0 0 1 0  Down, Depressed, Hopeless 1 0 0 1 0  PHQ - 2 Score 1 0 0 2 0  Altered sleeping 3 - - 0 -  Tired, decreased energy 1 - - 1 -  Change in appetite 1 - - 0 -  Feeling bad or failure about yourself  0 - - 0 -  Trouble concentrating 0 - - 0 -  Moving slowly or fidgety/restless 0 - - 0 -  Suicidal thoughts 0 - - 0 -  PHQ-9 Score 6 - - 3 -  Difficult doing work/chores Not difficult at all - - Not difficult at all -     Fall Risk: Fall Risk  01/30/2017 01/06/2017 11/12/2016 07/24/2016 05/06/2016  Falls in the past year? No No No No No  Number falls in past yr: - - - - -  Comment - - - - -  Injury with Fall? - - - - -  Follow up - - - - -      Functional Status Survey: Is the patient deaf or have difficulty hearing?: No Does the patient have difficulty seeing, even when wearing glasses/contacts?: No Does the patient have difficulty concentrating, remembering, or making decisions?: Yes(on Chemo week) Does the patient have difficulty walking or climbing stairs?: No Does the patient have difficulty dressing or bathing?: No Does the patient have difficulty doing errands alone such as visiting a doctor's office or shopping?: No    Assessment & Plan  1. Chronic low back pain with bilateral sciatica, unspecified back pain laterality  Doing well on duragesic and also gabapentin  2. 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 Generalized anxiety  disorder  Stable, continue medication   4. Insomnia, persistent  - zolpidem (AMBIEN CR) 6.25 MG CR tablet; Take 1 tablet (6.25 mg total) by mouth at bedtime.  Dispense: 90 tablet; Refill: 0  5. Hypothyroidism (acquired)  TSH  6. Low calcium levels  Discussed high calcium diet  7. Low blood potassium  Discussed high potassium diet  8. Thrombocytopenia ([place])  Keep follow up with cancer center  9. Chronic cervical pain  - gabapentin (NEURONTIN) 300 MG capsule; Take 1-3 capsules (300-900 mg total) by mouth 3 (three) times daily. 300 mg twice a day and 900 mg at night  Dispense: 540 capsule; Refill: 1  10. Migraine without aura and without status migrainosus, not intractable  - SUMAtriptan (IMITREX) 100 MG tablet; take 1 tablet by mouth if needed for migraines as directed  Dispense: 27 tablet; Refill: 1 - gabapentin (NEURONTIN) 300 MG capsule; Take 1-3 capsules (300-900 mg total) by mouth 3 (three) times daily. 300 mg twice a day and 900 mg at night  Dispense: 540 capsule; Refill: 1  11. Chronic right-sided low back pain with right-sided sciatica  - gabapentin (NEURONTIN) 300 MG capsule; Take 1-3 capsules (300-900 mg total)  by mouth 3 (three) times daily. 300 mg twice a day and 900 mg at night  Dispense: 540 capsule; Refill: 1  12. Neuropathy due to chemotherapeutic drug (HCC)  - gabapentin (NEURONTIN) 300 MG capsule; Take 1-3 capsules (300-900 mg total) by mouth 3 (three) times daily. 300 mg twice a day and 900 mg at night  Dispense: 540 capsule; Refill: 1

## 2017-04-28 ENCOUNTER — Inpatient Hospital Stay: Payer: PPO

## 2017-04-28 ENCOUNTER — Other Ambulatory Visit: Payer: PPO

## 2017-04-28 ENCOUNTER — Ambulatory Visit: Payer: PPO | Admitting: Oncology

## 2017-04-28 ENCOUNTER — Inpatient Hospital Stay (HOSPITAL_BASED_OUTPATIENT_CLINIC_OR_DEPARTMENT_OTHER): Payer: PPO | Admitting: Oncology

## 2017-04-28 VITALS — BP 128/78 | HR 98 | Temp 97.7°F | Resp 18 | Wt 165.2 lb

## 2017-04-28 DIAGNOSIS — C569 Malignant neoplasm of unspecified ovary: Secondary | ICD-10-CM | POA: Diagnosis not present

## 2017-04-28 DIAGNOSIS — G629 Polyneuropathy, unspecified: Secondary | ICD-10-CM

## 2017-04-28 DIAGNOSIS — G8929 Other chronic pain: Secondary | ICD-10-CM

## 2017-04-28 DIAGNOSIS — Z5111 Encounter for antineoplastic chemotherapy: Secondary | ICD-10-CM | POA: Diagnosis not present

## 2017-04-28 DIAGNOSIS — M5441 Lumbago with sciatica, right side: Secondary | ICD-10-CM

## 2017-04-28 DIAGNOSIS — Z87891 Personal history of nicotine dependence: Secondary | ICD-10-CM | POA: Diagnosis not present

## 2017-04-28 DIAGNOSIS — M542 Cervicalgia: Secondary | ICD-10-CM

## 2017-04-28 DIAGNOSIS — C7960 Secondary malignant neoplasm of unspecified ovary: Secondary | ICD-10-CM

## 2017-04-28 DIAGNOSIS — R51 Headache: Secondary | ICD-10-CM | POA: Diagnosis not present

## 2017-04-28 LAB — COMPREHENSIVE METABOLIC PANEL WITH GFR
ALT: 16 U/L (ref 14–54)
AST: 26 U/L (ref 15–41)
Albumin: 3.8 g/dL (ref 3.5–5.0)
Alkaline Phosphatase: 94 U/L (ref 38–126)
Anion gap: 7 (ref 5–15)
BUN: 17 mg/dL (ref 6–20)
CO2: 27 mmol/L (ref 22–32)
Calcium: 8.7 mg/dL — ABNORMAL LOW (ref 8.9–10.3)
Chloride: 101 mmol/L (ref 101–111)
Creatinine, Ser: 0.77 mg/dL (ref 0.44–1.00)
GFR calc Af Amer: >60 mL/min
GFR calc non Af Amer: >60 mL/min
Glucose, Bld: 141 mg/dL — ABNORMAL HIGH (ref 65–99)
Potassium: 3.9 mmol/L (ref 3.5–5.1)
Sodium: 135 mmol/L (ref 135–145)
Total Bilirubin: 0.4 mg/dL (ref 0.3–1.2)
Total Protein: 6.6 g/dL (ref 6.5–8.1)

## 2017-04-28 LAB — CBC WITH DIFFERENTIAL/PLATELET
Basophils Absolute: 0.1 10*3/uL (ref 0–0.1)
Basophils Relative: 1 %
Eosinophils Absolute: 0.1 10*3/uL (ref 0–0.7)
Eosinophils Relative: 1 %
HCT: 28.7 % — ABNORMAL LOW (ref 35.0–47.0)
Hemoglobin: 10.1 g/dL — ABNORMAL LOW (ref 12.0–16.0)
Lymphocytes Relative: 12 %
Lymphs Abs: 1.4 10*3/uL (ref 1.0–3.6)
MCH: 36 pg — ABNORMAL HIGH (ref 26.0–34.0)
MCHC: 35 g/dL (ref 32.0–36.0)
MCV: 102.8 fL — ABNORMAL HIGH (ref 80.0–100.0)
Monocytes Absolute: 0.8 10*3/uL (ref 0.2–0.9)
Monocytes Relative: 7 %
Neutro Abs: 9.4 10*3/uL — ABNORMAL HIGH (ref 1.4–6.5)
Neutrophils Relative %: 79 %
Platelets: 159 10*3/uL (ref 150–440)
RBC: 2.79 MIL/uL — ABNORMAL LOW (ref 3.80–5.20)
RDW: 16.9 % — ABNORMAL HIGH (ref 11.5–14.5)
WBC: 11.7 10*3/uL — ABNORMAL HIGH (ref 3.6–11.0)

## 2017-04-28 MED ORDER — ALPRAZOLAM 0.25 MG PO TABS: 0.2500 mg | ORAL_TABLET | Freq: Two times a day (BID) | ORAL | 0 refills | Status: DC | PRN

## 2017-04-28 MED ORDER — SODIUM CHLORIDE 0.9 % IV SOLN
175.0000 mg/m2 | Freq: Once | INTRAVENOUS | Status: DC
  Filled 2017-04-28: qty 55

## 2017-04-28 MED ORDER — HEPARIN SOD (PORK) LOCK FLUSH 100 UNIT/ML IV SOLN
500.0000 [IU] | Freq: Once | INTRAVENOUS | Status: AC
  Administered 2017-04-28: 500 [IU] via INTRAVENOUS

## 2017-04-28 MED ORDER — SODIUM CHLORIDE 0.9 % IV SOLN
700.0000 mg | Freq: Once | INTRAVENOUS | Status: AC
  Administered 2017-04-28: 700 mg via INTRAVENOUS
  Filled 2017-04-28: qty 70

## 2017-04-28 MED ORDER — DEXAMETHASONE SODIUM PHOSPHATE 10 MG/ML IJ SOLN
10.0000 mg | Freq: Once | INTRAMUSCULAR | Status: AC
  Administered 2017-04-28: 10 mg via INTRAVENOUS
  Filled 2017-04-28: qty 1

## 2017-04-28 MED ORDER — PEGFILGRASTIM 6 MG/0.6ML ~~LOC~~ PSKT
6.0000 mg | PREFILLED_SYRINGE | Freq: Once | SUBCUTANEOUS | Status: AC
  Administered 2017-04-28: 6 mg via SUBCUTANEOUS
  Filled 2017-04-28: qty 0.6

## 2017-04-28 MED ORDER — LORAZEPAM 2 MG/ML IJ SOLN
0.5000 mg | Freq: Once | INTRAMUSCULAR | Status: AC
  Administered 2017-04-28: 0.5 mg via INTRAVENOUS
  Filled 2017-04-28: qty 1

## 2017-04-28 MED ORDER — SODIUM CHLORIDE 0.9 % IV SOLN
Freq: Once | INTRAVENOUS | Status: AC
  Administered 2017-04-28: 10:00:00 via INTRAVENOUS
  Filled 2017-04-28: qty 1000

## 2017-04-28 MED ORDER — PACLITAXEL CHEMO INJECTION 300 MG/50ML
157.5000 mg/m2 | Freq: Once | INTRAVENOUS | Status: AC
  Administered 2017-04-28: 294 mg via INTRAVENOUS
  Filled 2017-04-28: qty 49

## 2017-04-28 MED ORDER — PALONOSETRON HCL INJECTION 0.25 MG/5ML
0.2500 mg | Freq: Once | INTRAVENOUS | Status: AC
  Administered 2017-04-28: 0.25 mg via INTRAVENOUS
  Filled 2017-04-28: qty 5

## 2017-04-28 MED ORDER — DIPHENHYDRAMINE HCL 50 MG/ML IJ SOLN
25.0000 mg | Freq: Once | INTRAMUSCULAR | Status: AC
  Administered 2017-04-28: 25 mg via INTRAVENOUS
  Filled 2017-04-28: qty 1

## 2017-04-28 MED ORDER — SODIUM CHLORIDE 0.9% FLUSH
10.0000 mL | INTRAVENOUS | Status: DC | PRN
  Filled 2017-04-28: qty 10

## 2017-04-28 MED ORDER — FAMOTIDINE IN NACL 20-0.9 MG/50ML-% IV SOLN
20.0000 mg | Freq: Once | INTRAVENOUS | Status: AC
  Administered 2017-04-28: 20 mg via INTRAVENOUS
  Filled 2017-04-28: qty 50

## 2017-04-28 MED ORDER — HEPARIN SOD (PORK) LOCK FLUSH 100 UNIT/ML IV SOLN
500.0000 [IU] | Freq: Once | INTRAVENOUS | Status: AC | PRN
  Administered 2017-04-28: 500 [IU]
  Filled 2017-04-28: qty 5

## 2017-04-28 MED ORDER — OXYCODONE-ACETAMINOPHEN 10-325 MG PO TABS: 1.0000 | ORAL_TABLET | Freq: Three times a day (TID) | ORAL | 0 refills | Status: DC | PRN

## 2017-04-28 NOTE — Progress Notes (Signed)
Patient reports headache this morning.

## 2017-04-29 LAB — CA 125: CANCER ANTIGEN (CA) 125: 46.3 U/mL — AB (ref 0.0–38.1)

## 2017-04-29 NOTE — Telephone Encounter (Signed)
Visit resolved

## 2017-05-07 ENCOUNTER — Other Ambulatory Visit: Payer: Self-pay | Admitting: *Deleted

## 2017-05-08 MED ORDER — FENTANYL 50 MCG/HR TD PT72: 50.0000 ug | MEDICATED_PATCH | TRANSDERMAL | 0 refills | Status: DC

## 2017-05-17 NOTE — Progress Notes (Signed)
[facility]  [phone] [phone]) [phone]  ID: Jessica Burgess OB: 03-Nov-1955  MR#: [id number]  UUE#:[id number]  Patient Care Team: Steele Sizer, MD as PCP - General (Family Medicine) Clent Jacks, RN as Registered Nurse  CHIEF COMPLAINT: Stage IIIc high-grade serous ovarian carcinoma  INTERVAL HISTORY: Patient returns to clinic today for further evaluation and consideration of cycle 8 of carboplatinum and Taxol.  She continues to have significant peripheral neuropathy that is affecting her day-to-day activity.  She otherwise feels well.  She has no other neurologic complaints.  She denies any chest pain, cough, or shortness of breath.  She has a good appetite and denies any nausea, vomiting, constipation, or diarrhea.  She has no urinary complaints.  Patient offers no further specific complaints today.   REVIEW OF SYSTEMS:   Review of Systems  Constitutional: Negative for fever, malaise/fatigue and weight loss.  Respiratory: Negative.  Negative for cough and shortness of breath.   Cardiovascular: Negative.  Negative for chest pain and leg swelling.  Gastrointestinal: Negative.  Negative for abdominal pain, blood in stool, constipation, diarrhea, melena, nausea and vomiting.  Genitourinary: Negative.   Musculoskeletal: Negative for back pain, joint pain and neck pain.  Skin: Negative.  Negative for rash.  Neurological: Positive for tingling and sensory change. Negative for weakness.  Psychiatric/Behavioral: The patient is nervous/anxious.     As per HPI. Otherwise, a complete review of systems is negative.  PAST MEDICAL HISTORY: Past Medical History:  Diagnosis Date  . Allergic rhinitis, cause unspecified   . Anxiety state, unspecified   . Arthritis   . Asthma    only when sick   . Backache, unspecified   . Bronchitis    hx of when get sick  . Cancer ([place])    skin cancer , basal cell   . Cancer ([place]) 11/2016   ovarian  .  Cervicalgia   . Complication of anesthesia   . Dermatophytosis of nail   . Dysmetabolic syndrome X   . Encounter for long-term (current) use of other medications   . Esophageal reflux   . Insomnia, unspecified   . Leukocytosis, unspecified   . Migraine without aura, without mention of intractable migraine without mention of status migrainosus   . Other and unspecified hyperlipidemia   . Other malaise and fatigue   . Overweight(278.02)   . Personal history of chemotherapy now   ovarian  . PONV (postoperative nausea and vomiting)   . Spinal stenosis in cervical region   . Symptomatic menopausal or female climacteric states   . Unspecified disorder of skin and subcutaneous tissue   . Unspecified vitamin D deficiency     PAST SURGICAL HISTORY: Past Surgical History:  Procedure Laterality Date  . ABDOMINAL HYSTERECTOMY    . ANTERIOR CERVICAL DECOMP/DISCECTOMY FUSION N/A 06/07/2015   Procedure: Cervical three - four and Cervical six- seven anterior cervical decompression with fusion interbody prosthesis plating and bonegraft;  Surgeon: Newman Pies, MD;  Location: [facility];  Service: Neurosurgery;  Laterality: N/A;  C34 and C67 anterior cervical decompression with fusion interbody prosthesis plating and bonegraft  . BACK SURGERY     x2 Lower   . EVACUATION OF CERVICAL HEMATOMA N/A 06/14/2015   Procedure: EVACUATION OF CERVICAL HEMATOMA;  Surgeon: Newman Pies, MD;  Location: [facility];  Service: Neurosurgery;  Laterality: N/A;  . NECK SURGERY     x3  . TONSILLECTOMY      FAMILY HISTORY: Family History  Problem  Relation Age of Onset  . Depression Mother   . Migraines Mother   . Dementia Father   . Hyperlipidemia Brother   . Breast cancer Paternal Aunt        68s    ADVANCED DIRECTIVES (Y/N):  N  HEALTH MAINTENANCE: Social History   Tobacco Use  . Smoking status: Former Smoker    Packs/day: 1.50    Years: 20.00    Pack years: 30.00    Types: Cigarettes     Start date: 03/19/1979    Last attempt to quit: 08/25/1999    Years since quitting: 17.7  . Smokeless tobacco: Never Used  Substance Use Topics  . Alcohol use: Yes    Alcohol/week: 0.0 oz    Comment: wine occassionally  . Drug use: No     Colonoscopy:  PAP:  Bone density:  Lipid panel:  No Known Allergies  Current Outpatient Medications  Medication Sig Dispense Refill  . albuterol (PROVENTIL HFA;VENTOLIN HFA) 108 (90 Base) MCG/ACT inhaler Inhale 2 puffs into the lungs every 6 (six) hours as needed for wheezing or shortness of breath. 1 Inhaler 2  . albuterol (PROVENTIL) (2.5 MG/3ML) 0.083% nebulizer solution Take 3 mLs (2.5 mg total) by nebulization every 6 (six) hours as needed for wheezing or shortness of breath. 75 mL 2  . ALPRAZolam (XANAX) 0.25 MG tablet Take 1 tablet (0.25 mg total) by mouth 2 (two) times daily as needed for anxiety. 60 tablet 0  . Ascorbic Acid (VITAMIN C PO) Take by mouth.    Marland Kitchen atorvastatin (LIPITOR) 40 MG tablet take 1 tablet by mouth once daily 90 tablet 4  . budesonide-formoterol (SYMBICORT) 160-4.5 MCG/ACT inhaler Inhale 2 puffs into the lungs 2 (two) times daily.    . Cholecalciferol (VITAMIN D-1000 MAX ST) 1000 UNITS tablet Take 1 tablet by mouth 2 (two) times daily.    . Cyanocobalamin (B-12 PO) Take 1 tablet by mouth daily.     . cyclobenzaprine (FLEXERIL) 10 MG tablet TAKE 1 TABLET BY MOUTH THREE TIMES A DAY IF NEEDED FOR MUSCLE SPASM 30 tablet 0  . docusate sodium (COLACE) 100 MG capsule Take 1 capsule (100 mg total) by mouth 2 (two) times daily. 60 capsule 0  . DULoxetine (CYMBALTA) 60 MG capsule TAKE 1 CAPSULE BY MOUTH ONCE DAILY 90 capsule 0  . estradiol (ESTRACE) 0.5 MG tablet take 1 tablet by mouth once daily 90 tablet 4  . fentaNYL (DURAGESIC - DOSED MCG/HR) 50 MCG/HR Place 1 patch (50 mcg total) onto the skin every 3 (three) days. 10 patch 0  . ferrous sulfate (IRON SUPPLEMENT) 325 (65 FE) MG tablet Take 1 tablet by mouth daily.    . fluticasone  (FLONASE) 50 MCG/ACT nasal spray Place 2 sprays into both nostrils as needed. 16 g 5  . gabapentin (NEURONTIN) 300 MG capsule Take 1-3 capsules (300-900 mg total) by mouth 3 (three) times daily. 300 mg twice a day and 900 mg at night 540 capsule 1  . ipratropium (ATROVENT HFA) 17 MCG/ACT inhaler Inhale 2 puffs into the lungs every 6 (six) hours.    Marland Kitchen levothyroxine (SYNTHROID, LEVOTHROID) 50 MCG tablet Take 1 tablet by mouth daily.    Marland Kitchen lidocaine-prilocaine (EMLA) cream Apply to affected area once 30 g 3  . Magnesium Oxide 500 MG CAPS Take 500 mg by mouth 2 (two) times daily.     . montelukast (SINGULAIR) 10 MG tablet Take 10 mg by mouth at bedtime.    . Multiple Vitamins-Minerals (  MULTIVITAMIN PO) Take 1 tablet by mouth daily.     . Omega-3 Fatty Acids (FISH OIL PO) Take by mouth.    . ondansetron (ZOFRAN) 8 MG tablet Take 1 tablet (8 mg total) by mouth 2 (two) times daily as needed for refractory nausea / vomiting. 60 tablet 2  . oxyCODONE-acetaminophen (PERCOCET) 10-325 MG tablet Take 1 tablet by mouth every 8 (eight) hours as needed for pain. 90 tablet 0  . Polyethylene Glycol 3350 (PEG 3350) POWD Take by mouth.    . potassium gluconate 595 MG TABS tablet Take 595 mg by mouth daily.    . prochlorperazine (COMPAZINE) 10 MG tablet Take 1 tablet (10 mg total) by mouth every 6 (six) hours as needed (Nausea or vomiting). 60 tablet 2  . SUMAtriptan (IMITREX) 100 MG tablet take 1 tablet by mouth if needed for migraines as directed 27 tablet 1  . zolpidem (AMBIEN CR) 6.25 MG CR tablet Take 1 tablet (6.25 mg total) by mouth at bedtime. 90 tablet 0   No current facility-administered medications for this visit.    Facility-Administered Medications Ordered in Other Visits  Medication Dose Route Frequency Provider Last Rate Last Dose  . 0.9 %  sodium chloride infusion   Intravenous Once Lloyd Huger, MD      . 0.9 %  sodium chloride infusion   Intravenous Once Lloyd Huger, MD      .  CARBOplatin (PARAPLATIN) 700 mg in sodium chloride 0.9 % 250 mL chemo infusion  700 mg Intravenous Once Lloyd Huger, MD 640 mL/hr at 05/19/17 1020 700 mg at 05/19/17 1020  . dexamethasone (DECADRON) 20 mg in sodium chloride 0.9 % 50 mL IVPB  20 mg Intravenous Once Lloyd Huger, MD      . dexamethasone (DECADRON) injection 10 mg  10 mg Intravenous Once Lloyd Huger, MD      . heparin lock flush 100 unit/mL  500 Units Intravenous Once Lloyd Huger, MD      . heparin lock flush 100 unit/mL  500 Units Intracatheter Once PRN Lloyd Huger, MD      . sodium chloride flush (NS) 0.9 % injection 10 mL  10 mL Intravenous PRN Lloyd Huger, MD   10 mL at 05/19/17 0825  . sodium chloride flush (NS) 0.9 % injection 10 mL  10 mL Intracatheter PRN Lloyd Huger, MD        OBJECTIVE: Vitals:   05/19/17 0844  BP: 114/66  Pulse: 94  Resp: 18  Temp: 97.9 F (36.6 C)     Body mass index is 29 kg/m.    ECOG FS:0 - Asymptomatic  General: Well-developed, well-nourished, no acute distress. Eyes: Pink conjunctiva, anicteric sclera. HEENT: Normocephalic, moist mucous membranes, clear oropharnyx. Lungs: Rare scattered wheezing, otherwise clear bilaterally. Heart: Regular rate and rhythm. No rubs, murmurs, or gallops. Abdomen: Surgical wound completely healed by secondary intention. Musculoskeletal: No edema, cyanosis, or clubbing. Neuro: Alert, answering all questions appropriately. Cranial nerves grossly intact. Skin: No rashes or petechiae noted. Psych: Normal affect.   LAB RESULTS:  Lab Results  Component Value Date   NA 137 05/19/2017   K 3.5 05/19/2017   CL 101 05/19/2017   CO2 25 05/19/2017   GLUCOSE 150 (H) 05/19/2017   BUN 10 05/19/2017   CREATININE 0.86 05/19/2017   CALCIUM 8.6 (L) 05/19/2017   PROT 6.7 05/19/2017   ALBUMIN 3.6 05/19/2017   AST 30 05/19/2017   ALT 14 05/19/2017  ALKPHOS 99 05/19/2017   BILITOT 0.2 (L) 05/19/2017    GFRNONAA >60 05/19/2017   GFRAA >60 05/19/2017    Lab Results  Component Value Date   WBC 3.7 05/19/2017   NEUTROABS 1.3 (L) 05/19/2017   HGB 9.5 (L) 05/19/2017   HCT 26.1 (L) 05/19/2017   MCV 105.3 (H) 05/19/2017   PLT 96 (L) 05/19/2017     STUDIES: No results found.  ASSESSMENT: Stage IIIc high-grade serous ovarian carcinoma  PLAN:  1. Stage IIIc high-grade serous ovarian carcinoma:  She underwent debulking surgery at [facility] on November 19, 2016. She was also initiated on a clinical trial and has received preoperative infusion of Keytruda on November 11, 2016.  Because of patient's persistent peripheral neuropathy as well as pancytopenia, will discontinue Taxol altogether.  Proceed with carboplatinum only today.  Her Ca1 25 has trended up slightly to 46.3.  If today's results continue to trend up we will repeat imaging.  If it trends back down will continue with treatment and patient will return to clinic in 3 weeks consideration of cycle 9.   2. Pain: Continue fentanyl patch and oxycodone as needed.  Previously discussed with patient about referring back to PCP for pain management at conclusion of therapy.   3. Diarrhea: Resolved. Continue Imodium as needed. 4. Anxiety: Continue Xanax as prescribed. 5.  Peripheral neuropathy: Discontinue Taxol as above.  Continue gabapentin as prescribed.   Patient expressed understanding and was in agreement with this plan. She also understands that She can call clinic at any time with any questions, concerns, or complaints.   Cancer Staging Ovarian cancer, unspecified laterality ([place]) Staging form: Ovary, Fallopian Tube, and Primary Peritoneal Carcinoma, AJCC 8th Edition - Clinical: No stage assigned - Unsigned   Lloyd Huger, MD   05/19/2017 10:25 AM

## 2017-05-19 ENCOUNTER — Inpatient Hospital Stay: Payer: PPO

## 2017-05-19 ENCOUNTER — Encounter: Payer: Self-pay | Admitting: Oncology

## 2017-05-19 ENCOUNTER — Inpatient Hospital Stay (HOSPITAL_BASED_OUTPATIENT_CLINIC_OR_DEPARTMENT_OTHER): Payer: PPO | Admitting: Oncology

## 2017-05-19 ENCOUNTER — Inpatient Hospital Stay: Payer: PPO | Attending: Oncology

## 2017-05-19 VITALS — BP 114/66 | HR 94 | Temp 97.9°F | Resp 18 | Ht 63.0 in | Wt 163.7 lb

## 2017-05-19 DIAGNOSIS — F419 Anxiety disorder, unspecified: Secondary | ICD-10-CM | POA: Insufficient documentation

## 2017-05-19 DIAGNOSIS — Z87891 Personal history of nicotine dependence: Secondary | ICD-10-CM | POA: Diagnosis not present

## 2017-05-19 DIAGNOSIS — Z5111 Encounter for antineoplastic chemotherapy: Secondary | ICD-10-CM | POA: Insufficient documentation

## 2017-05-19 DIAGNOSIS — R52 Pain, unspecified: Secondary | ICD-10-CM | POA: Diagnosis not present

## 2017-05-19 DIAGNOSIS — G620 Drug-induced polyneuropathy: Secondary | ICD-10-CM | POA: Insufficient documentation

## 2017-05-19 DIAGNOSIS — C569 Malignant neoplasm of unspecified ovary: Secondary | ICD-10-CM | POA: Insufficient documentation

## 2017-05-19 DIAGNOSIS — C7960 Secondary malignant neoplasm of unspecified ovary: Secondary | ICD-10-CM

## 2017-05-19 DIAGNOSIS — D61818 Other pancytopenia: Secondary | ICD-10-CM | POA: Insufficient documentation

## 2017-05-19 LAB — COMPREHENSIVE METABOLIC PANEL
ALBUMIN: 3.6 g/dL (ref 3.5–5.0)
ALK PHOS: 99 U/L (ref 38–126)
ALT: 14 U/L (ref 14–54)
AST: 30 U/L (ref 15–41)
Anion gap: 11 (ref 5–15)
BUN: 10 mg/dL (ref 6–20)
CALCIUM: 8.6 mg/dL — AB (ref 8.9–10.3)
CO2: 25 mmol/L (ref 22–32)
CREATININE: 0.86 mg/dL (ref 0.44–1.00)
Chloride: 101 mmol/L (ref 101–111)
GFR calc Af Amer: >60 mL/min (ref >60)
GFR calc non Af Amer: >60 mL/min (ref >60)
GLUCOSE: 150 mg/dL — AB (ref 65–99)
Potassium: 3.5 mmol/L (ref 3.5–5.1)
SODIUM: 137 mmol/L (ref 135–145)
Total Bilirubin: 0.2 mg/dL — ABNORMAL LOW (ref 0.3–1.2)
Total Protein: 6.7 g/dL (ref 6.5–8.1)

## 2017-05-19 LAB — CBC WITH DIFFERENTIAL/PLATELET
BASOS PCT: 1 %
Basophils Absolute: 0 10*3/uL (ref 0–0.1)
EOS ABS: 0 10*3/uL (ref 0–0.7)
Eosinophils Relative: 1 %
HEMATOCRIT: 26.1 % — AB (ref 35.0–47.0)
HEMOGLOBIN: 9.5 g/dL — AB (ref 12.0–16.0)
LYMPHS ABS: 1.8 10*3/uL (ref 1.0–3.6)
Lymphocytes Relative: 49 %
MCH: 38.1 pg — ABNORMAL HIGH (ref 26.0–34.0)
MCHC: 36.2 g/dL — AB (ref 32.0–36.0)
MCV: 105.3 fL — ABNORMAL HIGH (ref 80.0–100.0)
MONOS PCT: 14 %
Monocytes Absolute: 0.5 10*3/uL (ref 0.2–0.9)
NEUTROS ABS: 1.3 10*3/uL — AB (ref 1.4–6.5)
NEUTROS PCT: 35 %
Platelets: 96 10*3/uL — ABNORMAL LOW (ref 150–440)
RBC: 2.48 MIL/uL — ABNORMAL LOW (ref 3.80–5.20)
RDW: 15.7 % — AB (ref 11.5–14.5)
WBC: 3.7 10*3/uL (ref 3.6–11.0)

## 2017-05-19 MED ORDER — SODIUM CHLORIDE 0.9 % IV SOLN
Freq: Once | INTRAVENOUS | Status: AC
Start: 2017-05-19 — End: 2017-05-19
  Administered 2017-05-19: 10:00:00 via INTRAVENOUS
  Filled 2017-05-19: qty 1000

## 2017-05-19 MED ORDER — SODIUM CHLORIDE 0.9% FLUSH
10.0000 mL | INTRAVENOUS | Status: DC | PRN
  Administered 2017-05-19: 10 mL via INTRAVENOUS
  Filled 2017-05-19: qty 10

## 2017-05-19 MED ORDER — HEPARIN SOD (PORK) LOCK FLUSH 100 UNIT/ML IV SOLN
500.0000 [IU] | Freq: Once | INTRAVENOUS | Status: AC
  Administered 2017-05-19: 500 [IU] via INTRAVENOUS
  Filled 2017-05-19: qty 5

## 2017-05-19 MED ORDER — PALONOSETRON HCL INJECTION 0.25 MG/5ML
0.2500 mg | Freq: Once | INTRAVENOUS | Status: AC
Start: 2017-05-19 — End: 2017-05-19
  Administered 2017-05-19: 0.25 mg via INTRAVENOUS
  Filled 2017-05-19: qty 5

## 2017-05-19 MED ORDER — HEPARIN SOD (PORK) LOCK FLUSH 100 UNIT/ML IV SOLN: 500.0000 [IU] | Freq: Once | INTRAVENOUS | Status: DC | PRN

## 2017-05-19 MED ORDER — SODIUM CHLORIDE 0.9 % IV SOLN: 10.0000 mg | Freq: Once | INTRAVENOUS | Status: DC

## 2017-05-19 MED ORDER — DEXAMETHASONE SODIUM PHOSPHATE 10 MG/ML IJ SOLN
10.0000 mg | Freq: Once | INTRAMUSCULAR | Status: AC
  Administered 2017-05-19: 10 mg via INTRAVENOUS
  Filled 2017-05-19: qty 1

## 2017-05-19 MED ORDER — FAMOTIDINE IN NACL 20-0.9 MG/50ML-% IV SOLN: 20.0000 mg | Freq: Once | INTRAVENOUS | Status: DC

## 2017-05-19 MED ORDER — SODIUM CHLORIDE 0.9 % IV SOLN
700.0000 mg | Freq: Once | INTRAVENOUS | Status: AC
  Administered 2017-05-19: 700 mg via INTRAVENOUS
  Filled 2017-05-19: qty 70

## 2017-05-19 MED ORDER — DIPHENHYDRAMINE HCL 50 MG/ML IJ SOLN: 25.0000 mg | Freq: Once | INTRAMUSCULAR | Status: DC

## 2017-05-19 MED ORDER — SODIUM CHLORIDE 0.9% FLUSH
10.0000 mL | INTRAVENOUS | Status: DC | PRN
  Filled 2017-05-19: qty 10

## 2017-05-19 MED ORDER — LORAZEPAM 2 MG/ML IJ SOLN
0.5000 mg | Freq: Once | INTRAMUSCULAR | Status: AC
Start: 2017-05-19 — End: 2017-05-19
  Administered 2017-05-19: 0.5 mg via INTRAVENOUS
  Filled 2017-05-19: qty 1

## 2017-05-19 NOTE — Progress Notes (Signed)
Per Tillie Rung RN per Dr. Grayland Ormond and MD treatment parameters pt to receive Carboplatin only today, No Taxol or [place], okay to proceed with Platelets of 96 and ANC 1.3.

## 2017-05-19 NOTE — Progress Notes (Signed)
Carbo only.  Adjusted premeds in plan accordingly.

## 2017-05-19 NOTE — Progress Notes (Signed)
Patient c/o tingling and numbness to bilateral hands and feet

## 2017-05-20 ENCOUNTER — Telehealth: Payer: Self-pay | Admitting: *Deleted

## 2017-05-20 LAB — CA 125: Cancer Antigen (CA) 125: 38 U/mL (ref 0.0–38.1)

## 2017-05-20 NOTE — Telephone Encounter (Signed)
Pt notified of CA 125 results, keep f/u as scheduled in 3 weeks.

## 2017-05-30 DIAGNOSIS — M25512 Pain in left shoulder: Secondary | ICD-10-CM | POA: Diagnosis not present

## 2017-05-30 DIAGNOSIS — G8929 Other chronic pain: Secondary | ICD-10-CM | POA: Diagnosis not present

## 2017-05-30 DIAGNOSIS — M25511 Pain in right shoulder: Secondary | ICD-10-CM | POA: Diagnosis not present

## 2017-05-30 DIAGNOSIS — M7552 Bursitis of left shoulder: Secondary | ICD-10-CM | POA: Diagnosis not present

## 2017-05-30 DIAGNOSIS — M7542 Impingement syndrome of left shoulder: Secondary | ICD-10-CM | POA: Diagnosis not present

## 2017-05-30 DIAGNOSIS — M7541 Impingement syndrome of right shoulder: Secondary | ICD-10-CM | POA: Diagnosis not present

## 2017-06-02 ENCOUNTER — Other Ambulatory Visit: Payer: Self-pay | Admitting: Sports Medicine

## 2017-06-02 DIAGNOSIS — M7541 Impingement syndrome of right shoulder: Secondary | ICD-10-CM

## 2017-06-02 DIAGNOSIS — M25511 Pain in right shoulder: Principal | ICD-10-CM

## 2017-06-02 DIAGNOSIS — G8929 Other chronic pain: Secondary | ICD-10-CM

## 2017-06-08 NOTE — Progress Notes (Signed)
[place]  [phone] [phone]  ID: Jessica Burgess OB: Nov 19, 1955  MR#: [id number]  GQQ#:[id number]  Patient Care Team: Steele Sizer, MD as PCP - General (Family Medicine) Clent Jacks, RN as Registered Nurse  CHIEF COMPLAINT: Stage IIIc high-grade serous ovarian carcinoma  INTERVAL HISTORY: Patient returns to clinic today for further evaluation and consideration of cycle 9 of carboplatinum and Taxol.  She has noticed increased weakness and fatigue as well as shortness of breath over the past week.  She continues to have peripheral neuropathy that is affecting her day-to-day activity.  She otherwise feels well.  She has no other neurologic complaints.  She denies any chest pain or cough.  She has a good appetite and denies any nausea, vomiting, constipation, or diarrhea.  She has no urinary complaints.  Patient offers no further specific complaints today.   REVIEW OF SYSTEMS:   Review of Systems  Constitutional: Positive for malaise/fatigue. Negative for fever and weight loss.  Respiratory: Positive for shortness of breath. Negative for cough.   Cardiovascular: Negative.  Negative for chest pain and leg swelling.  Gastrointestinal: Negative.  Negative for abdominal pain, blood in stool, constipation, diarrhea, melena, nausea and vomiting.  Genitourinary: Negative.   Musculoskeletal: Negative.  Negative for back pain, joint pain and neck pain.  Skin: Negative.  Negative for rash.  Neurological: Positive for tingling, sensory change and weakness. Negative for focal weakness.  Psychiatric/Behavioral: The patient is nervous/anxious.     As per HPI. Otherwise, a complete review of systems is negative.  PAST MEDICAL HISTORY: Past Medical History:  Diagnosis Date  . Allergic rhinitis, cause unspecified   . Anxiety state, unspecified   . Arthritis   . Asthma    only when sick   . Backache, unspecified   . Bronchitis    hx of when  get sick  . Cancer ([place])    skin cancer , basal cell   . Cancer ([place]) 11/2016   ovarian  . Cervicalgia   . Complication of anesthesia   . Dermatophytosis of nail   . Dysmetabolic syndrome X   . Encounter for long-term (current) use of other medications   . Esophageal reflux   . Insomnia, unspecified   . Leukocytosis, unspecified   . Migraine without aura, without mention of intractable migraine without mention of status migrainosus   . Other and unspecified hyperlipidemia   . Other malaise and fatigue   . Overweight(278.02)   . Personal history of chemotherapy now   ovarian  . PONV (postoperative nausea and vomiting)   . Spinal stenosis in cervical region   . Symptomatic menopausal or female climacteric states   . Unspecified disorder of skin and subcutaneous tissue   . Unspecified vitamin D deficiency     PAST SURGICAL HISTORY: Past Surgical History:  Procedure Laterality Date  . ABDOMINAL HYSTERECTOMY    . ANTERIOR CERVICAL DECOMP/DISCECTOMY FUSION N/A 06/07/2015   Procedure: Cervical three - four and Cervical six- seven anterior cervical decompression with fusion interbody prosthesis plating and bonegraft;  Surgeon: Newman Pies, MD;  Location: [facility];  Service: Neurosurgery;  Laterality: N/A;  C34 and C67 anterior cervical decompression with fusion interbody prosthesis plating and bonegraft  . BACK SURGERY     x2 Lower   . EVACUATION OF CERVICAL HEMATOMA N/A 06/14/2015   Procedure: EVACUATION OF CERVICAL HEMATOMA;  Surgeon: Newman Pies, MD;  Location: [facility];  Service: Neurosurgery;  Laterality: N/A;  . NECK SURGERY  x3  . TONSILLECTOMY      FAMILY HISTORY: Family History  Problem Relation Age of Onset  . Depression Mother   . Migraines Mother   . Dementia Father   . Hyperlipidemia Brother   . Breast cancer Paternal Aunt        49s    ADVANCED DIRECTIVES (Y/N):  N  HEALTH MAINTENANCE: Social History   Tobacco Use  . Smoking status:  Former Smoker    Packs/day: 1.50    Years: 20.00    Pack years: 30.00    Types: Cigarettes    Start date: 03/19/1979    Last attempt to quit: 08/25/1999    Years since quitting: 17.8  . Smokeless tobacco: Never Used  Substance Use Topics  . Alcohol use: Yes    Alcohol/week: 0.0 oz    Comment: wine occassionally  . Drug use: No     Colonoscopy:  PAP:  Bone density:  Lipid panel:  No Known Allergies  Current Outpatient Medications  Medication Sig Dispense Refill  . albuterol (PROVENTIL HFA;VENTOLIN HFA) 108 (90 Base) MCG/ACT inhaler Inhale 2 puffs into the lungs every 6 (six) hours as needed for wheezing or shortness of breath. 1 Inhaler 2  . albuterol (PROVENTIL) (2.5 MG/3ML) 0.083% nebulizer solution Take 3 mLs (2.5 mg total) by nebulization every 6 (six) hours as needed for wheezing or shortness of breath. 75 mL 2  . ALPRAZolam (XANAX) 0.25 MG tablet Take 1 tablet (0.25 mg total) by mouth 2 (two) times daily as needed for anxiety. 60 tablet 0  . Ascorbic Acid (VITAMIN C PO) Take by mouth.    Marland Kitchen atorvastatin (LIPITOR) 40 MG tablet take 1 tablet by mouth once daily 90 tablet 4  . budesonide-formoterol (SYMBICORT) 160-4.5 MCG/ACT inhaler Inhale 2 puffs into the lungs 2 (two) times daily.    . Cholecalciferol (VITAMIN D-1000 MAX ST) 1000 UNITS tablet Take 1 tablet by mouth 2 (two) times daily.    . Cyanocobalamin (B-12 PO) Take 1 tablet by mouth daily.     . cyclobenzaprine (FLEXERIL) 10 MG tablet TAKE 1 TABLET BY MOUTH THREE TIMES A DAY IF NEEDED FOR MUSCLE SPASM 30 tablet 0  . docusate sodium (COLACE) 100 MG capsule Take 1 capsule (100 mg total) by mouth 2 (two) times daily. 60 capsule 0  . DULoxetine (CYMBALTA) 60 MG capsule TAKE 1 CAPSULE BY MOUTH ONCE DAILY 90 capsule 0  . estradiol (ESTRACE) 0.5 MG tablet take 1 tablet by mouth once daily 90 tablet 4  . fentaNYL (DURAGESIC - DOSED MCG/HR) 50 MCG/HR Place 1 patch (50 mcg total) onto the skin every 3 (three) days. 10 patch 0  .  ferrous sulfate (IRON SUPPLEMENT) 325 (65 FE) MG tablet Take 1 tablet by mouth daily.    . fluticasone (FLONASE) 50 MCG/ACT nasal spray Place 2 sprays into both nostrils as needed. 16 g 5  . gabapentin (NEURONTIN) 300 MG capsule Take 1-3 capsules (300-900 mg total) by mouth 3 (three) times daily. 300 mg twice a day and 900 mg at night 540 capsule 1  . ipratropium (ATROVENT HFA) 17 MCG/ACT inhaler Inhale 2 puffs into the lungs every 6 (six) hours.    Marland Kitchen levothyroxine (SYNTHROID, LEVOTHROID) 50 MCG tablet Take 1 tablet by mouth daily.    Marland Kitchen lidocaine-prilocaine (EMLA) cream Apply to affected area once 30 g 3  . Magnesium Oxide 500 MG CAPS Take 500 mg by mouth 2 (two) times daily.     . montelukast (SINGULAIR) 10  MG tablet Take 10 mg by mouth at bedtime.    . Multiple Vitamins-Minerals (MULTIVITAMIN PO) Take 1 tablet by mouth daily.     . Omega-3 Fatty Acids (FISH OIL PO) Take by mouth.    . ondansetron (ZOFRAN) 8 MG tablet Take 1 tablet (8 mg total) by mouth 2 (two) times daily as needed for refractory nausea / vomiting. 60 tablet 2  . oxyCODONE-acetaminophen (PERCOCET) 10-325 MG tablet Take 1 tablet by mouth every 8 (eight) hours as needed for pain. 90 tablet 0  . Polyethylene Glycol 3350 (PEG 3350) POWD Take by mouth.    . potassium gluconate 595 MG TABS tablet Take 595 mg by mouth daily.    . prochlorperazine (COMPAZINE) 10 MG tablet Take 1 tablet (10 mg total) by mouth every 6 (six) hours as needed (Nausea or vomiting). 60 tablet 2  . SUMAtriptan (IMITREX) 100 MG tablet take 1 tablet by mouth if needed for migraines as directed 27 tablet 1  . zolpidem (AMBIEN CR) 6.25 MG CR tablet Take 1 tablet (6.25 mg total) by mouth at bedtime. 90 tablet 0   No current facility-administered medications for this visit.    Facility-Administered Medications Ordered in Other Visits  Medication Dose Route Frequency Provider Last Rate Last Dose  . 0.9 %  sodium chloride infusion   Intravenous Once Lloyd Huger, MD      . 0.9 %  sodium chloride infusion   Intravenous Once Lloyd Huger, MD      . dexamethasone (DECADRON) 20 mg in sodium chloride 0.9 % 50 mL IVPB  20 mg Intravenous Once Lloyd Huger, MD      . dexamethasone (DECADRON) injection 10 mg  10 mg Intravenous Once Lloyd Huger, MD      . heparin lock flush 100 unit/mL  500 Units Intravenous Once Lloyd Huger, MD      . sodium chloride flush (NS) 0.9 % injection 10 mL  10 mL Intravenous PRN Lloyd Huger, MD   10 mL at 06/09/17 0827    OBJECTIVE: Vitals:   06/09/17 0901 06/09/17 0915  BP: 118/77   Pulse: 88   Resp: 18   Temp: 97.8 F (36.6 C)   SpO2:  98%     Body mass index is 29.14 kg/m.    ECOG FS:0 - Asymptomatic  General: Well-developed, well-nourished, no acute distress. Eyes: Pink conjunctiva, anicteric sclera. HEENT: Normocephalic, moist mucous membranes, clear oropharnyx. Lungs: Rare scattered wheezing, otherwise clear bilaterally. Heart: Regular rate and rhythm. No rubs, murmurs, or gallops. Abdomen: Surgical wound completely healed by secondary intention. Musculoskeletal: No edema, cyanosis, or clubbing. Neuro: Alert, answering all questions appropriately. Cranial nerves grossly intact. Skin: No rashes or petechiae noted. Psych: Normal affect.   LAB RESULTS:  Lab Results  Component Value Date   NA 136 06/09/2017   K 3.3 (L) 06/09/2017   CL 100 (L) 06/09/2017   CO2 29 06/09/2017   GLUCOSE 133 (H) 06/09/2017   BUN 18 06/09/2017   CREATININE 0.89 06/09/2017   CALCIUM 8.5 (L) 06/09/2017   PROT 6.2 (L) 06/09/2017   ALBUMIN 3.2 (L) 06/09/2017   AST 27 06/09/2017   ALT 15 06/09/2017   ALKPHOS 69 06/09/2017   BILITOT 0.7 06/09/2017   GFRNONAA >60 06/09/2017   GFRAA >60 06/09/2017    Lab Results  Component Value Date   WBC 2.7 (L) 06/09/2017   NEUTROABS 1.1 (L) 06/09/2017   HGB 6.3 (L) 06/09/2017   HCT  17.4 (L) 06/09/2017   MCV 105.4 (H) 06/09/2017   PLT 9 (LL)  06/09/2017     STUDIES: No results found.  ASSESSMENT: Stage IIIc high-grade serous ovarian carcinoma  PLAN:  1. Stage IIIc high-grade serous ovarian carcinoma:  She underwent debulking surgery at [facility] on November 19, 2016. She was also initiated on a clinical trial and has received preoperative infusion of Keytruda on November 11, 2016.  Because of patient's persistent peripheral neuropathy as well as pancytopenia, Taxol was discussed continued altogether.  Ca 125 is now trending back down.  Delay carboplatinum today secondary to pancytopenia.  Return to clinic in 1 week for reconsideration of cycle 9 of carboplatinum. 2. Pain: Continue fentanyl patch and oxycodone as needed.  Previously discussed with patient about referring back to PCP for pain management at conclusion of therapy.   3. Diarrhea: Resolved. Continue Imodium as needed. 4. Anxiety: Continue Xanax as prescribed. 5.  Peripheral neuropathy: Discontinue Taxol as above.  Continue gabapentin as prescribed. 6.  Thrombocytopenia: Patient will receive 2 units platelets today. 7.  Anemia: Patient will receive 1 unit packed red blood cells today.   Patient expressed understanding and was in agreement with this plan. She also understands that She can call clinic at any time with any questions, concerns, or complaints.   Cancer Staging Ovarian cancer, unspecified laterality ([place]) Staging form: Ovary, Fallopian Tube, and Primary Peritoneal Carcinoma, AJCC 8th Edition - Clinical: No stage assigned - Unsigned   Lloyd Huger, MD   06/09/2017 9:32 AM

## 2017-06-09 ENCOUNTER — Inpatient Hospital Stay: Payer: PPO

## 2017-06-09 ENCOUNTER — Inpatient Hospital Stay (HOSPITAL_BASED_OUTPATIENT_CLINIC_OR_DEPARTMENT_OTHER): Payer: PPO | Admitting: Oncology

## 2017-06-09 ENCOUNTER — Encounter: Payer: Self-pay | Admitting: Oncology

## 2017-06-09 VITALS — BP 118/77 | HR 88 | Temp 97.8°F | Resp 18 | Wt 164.5 lb

## 2017-06-09 DIAGNOSIS — D649 Anemia, unspecified: Secondary | ICD-10-CM

## 2017-06-09 DIAGNOSIS — D696 Thrombocytopenia, unspecified: Secondary | ICD-10-CM | POA: Diagnosis not present

## 2017-06-09 DIAGNOSIS — C569 Malignant neoplasm of unspecified ovary: Secondary | ICD-10-CM

## 2017-06-09 DIAGNOSIS — Z5111 Encounter for antineoplastic chemotherapy: Secondary | ICD-10-CM | POA: Diagnosis not present

## 2017-06-09 DIAGNOSIS — R52 Pain, unspecified: Secondary | ICD-10-CM | POA: Diagnosis not present

## 2017-06-09 DIAGNOSIS — C7960 Secondary malignant neoplasm of unspecified ovary: Secondary | ICD-10-CM

## 2017-06-09 LAB — CBC WITH DIFFERENTIAL/PLATELET
Basophils Absolute: 0 10*3/uL (ref 0–0.1)
Basophils Relative: 1 %
Eosinophils Absolute: 0 10*3/uL (ref 0–0.7)
Eosinophils Relative: 1 %
HCT: 17.4 % — ABNORMAL LOW (ref 35.0–47.0)
Hemoglobin: 6.3 g/dL — ABNORMAL LOW (ref 12.0–16.0)
LYMPHS ABS: 1.4 10*3/uL (ref 1.0–3.6)
Lymphocytes Relative: 50 %
MCH: 38.5 pg — AB (ref 26.0–34.0)
MCHC: 36.5 g/dL — ABNORMAL HIGH (ref 32.0–36.0)
MCV: 105.4 fL — ABNORMAL HIGH (ref 80.0–100.0)
Monocytes Absolute: 0.2 10*3/uL (ref 0.2–0.9)
Monocytes Relative: 6 %
Neutro Abs: 1.1 10*3/uL — ABNORMAL LOW (ref 1.4–6.5)
Neutrophils Relative %: 42 %
PLATELETS: 9 10*3/uL — AB (ref 150–400)
RBC: 1.65 MIL/uL — AB (ref 3.80–5.20)
RDW: 13.7 % (ref 11.5–14.5)
WBC: 2.7 10*3/uL — AB (ref 3.6–11.0)

## 2017-06-09 LAB — COMPREHENSIVE METABOLIC PANEL
ALK PHOS: 69 U/L (ref 38–126)
ALT: 15 U/L (ref 14–54)
AST: 27 U/L (ref 15–41)
Albumin: 3.2 g/dL — ABNORMAL LOW (ref 3.5–5.0)
Anion gap: 7 (ref 5–15)
BILIRUBIN TOTAL: 0.7 mg/dL (ref 0.3–1.2)
BUN: 18 mg/dL (ref 6–20)
CHLORIDE: 100 mmol/L — AB (ref 101–111)
CO2: 29 mmol/L (ref 22–32)
CREATININE: 0.89 mg/dL (ref 0.44–1.00)
Calcium: 8.5 mg/dL — ABNORMAL LOW (ref 8.9–10.3)
GFR calc Af Amer: >60 mL/min (ref >60)
Glucose, Bld: 133 mg/dL — ABNORMAL HIGH (ref 65–99)
Potassium: 3.3 mmol/L — ABNORMAL LOW (ref 3.5–5.1)
Sodium: 136 mmol/L (ref 135–145)
TOTAL PROTEIN: 6.2 g/dL — AB (ref 6.5–8.1)

## 2017-06-09 LAB — PREPARE RBC (CROSSMATCH)

## 2017-06-09 LAB — ABO/RH: ABO/RH(D): B POS

## 2017-06-09 MED ORDER — DIPHENHYDRAMINE HCL 50 MG/ML IJ SOLN
25.0000 mg | Freq: Once | INTRAMUSCULAR | Status: AC
  Administered 2017-06-09: 25 mg via INTRAVENOUS
  Filled 2017-06-09: qty 1

## 2017-06-09 MED ORDER — SODIUM CHLORIDE 0.9 % IV SOLN
250.0000 mL | Freq: Once | INTRAVENOUS | Status: AC
  Administered 2017-06-09: 250 mL via INTRAVENOUS
  Filled 2017-06-09: qty 250

## 2017-06-09 MED ORDER — ACETAMINOPHEN 325 MG PO TABS
650.0000 mg | ORAL_TABLET | Freq: Once | ORAL | Status: AC
  Administered 2017-06-09: 650 mg via ORAL
  Filled 2017-06-09: qty 2

## 2017-06-09 MED ORDER — HEPARIN SOD (PORK) LOCK FLUSH 100 UNIT/ML IV SOLN: 500.0000 [IU] | Freq: Once | INTRAVENOUS | Status: AC

## 2017-06-09 MED ORDER — HEPARIN SOD (PORK) LOCK FLUSH 100 UNIT/ML IV SOLN
500.0000 [IU] | Freq: Every day | INTRAVENOUS | Status: AC | PRN
  Administered 2017-06-09: 500 [IU]
  Filled 2017-06-09: qty 5

## 2017-06-09 MED ORDER — SODIUM CHLORIDE 0.9% FLUSH
10.0000 mL | INTRAVENOUS | Status: DC | PRN
  Administered 2017-06-09: 10 mL via INTRAVENOUS
  Filled 2017-06-09: qty 10

## 2017-06-09 NOTE — Progress Notes (Signed)
Pt in with brother for follow up and treatment, reports having extreme shortness of breath on exertion yesterday.  o2 sat 98%

## 2017-06-10 ENCOUNTER — Ambulatory Visit
Admission: RE | Admit: 2017-06-10 | Discharge: 2017-06-10 | Disposition: A | Discharge status: Home / Self Care | Payer: PPO | Source: Ambulatory Visit | Attending: Sports Medicine | Admitting: Sports Medicine

## 2017-06-10 DIAGNOSIS — M6250 Muscle wasting and atrophy, not elsewhere classified, unspecified site: Secondary | ICD-10-CM | POA: Insufficient documentation

## 2017-06-10 DIAGNOSIS — M25511 Pain in right shoulder: Secondary | ICD-10-CM | POA: Diagnosis present

## 2017-06-10 DIAGNOSIS — M75121 Complete rotator cuff tear or rupture of right shoulder, not specified as traumatic: Secondary | ICD-10-CM | POA: Diagnosis not present

## 2017-06-10 DIAGNOSIS — S46011A Strain of muscle(s) and tendon(s) of the rotator cuff of right shoulder, initial encounter: Secondary | ICD-10-CM | POA: Diagnosis not present

## 2017-06-10 DIAGNOSIS — G8929 Other chronic pain: Secondary | ICD-10-CM | POA: Diagnosis present

## 2017-06-10 DIAGNOSIS — M7521 Bicipital tendinitis, right shoulder: Secondary | ICD-10-CM | POA: Insufficient documentation

## 2017-06-10 DIAGNOSIS — M7541 Impingement syndrome of right shoulder: Secondary | ICD-10-CM | POA: Diagnosis present

## 2017-06-10 LAB — BPAM PLATELET PHERESIS
BLOOD PRODUCT EXPIRATION DATE: 201903272359
Blood Product Expiration Date: 201903261223
ISSUE DATE / TIME: 201903251417
ISSUE DATE / TIME: 201903251315
UNIT TYPE AND RH: 6200
Unit Type and Rh: 5100

## 2017-06-10 LAB — BPAM RBC
Blood Product Expiration Date: 201904082359
ISSUE DATE / TIME: 201903251125
Unit Type and Rh: 7300

## 2017-06-10 LAB — PREPARE PLATELET PHERESIS
UNIT DIVISION: 0
Unit division: 0

## 2017-06-10 LAB — TYPE AND SCREEN
ABO/RH(D): B POS
ANTIBODY SCREEN: NEGATIVE
Unit division: 0

## 2017-06-10 LAB — CA 125: CANCER ANTIGEN (CA) 125: 31.7 U/mL (ref 0.0–38.1)

## 2017-06-12 ENCOUNTER — Other Ambulatory Visit: Payer: Self-pay | Admitting: *Deleted

## 2017-06-12 MED ORDER — FENTANYL 50 MCG/HR TD PT72: 50.0000 ug | MEDICATED_PATCH | TRANSDERMAL | 0 refills | Status: DC

## 2017-06-13 ENCOUNTER — Other Ambulatory Visit: Payer: Self-pay | Admitting: Oncology

## 2017-06-13 ENCOUNTER — Ambulatory Visit (INDEPENDENT_AMBULATORY_CARE_PROVIDER_SITE_OTHER): Payer: PPO

## 2017-06-13 VITALS — BP 124/68 | HR 77 | Temp 98.2°F | Resp 12 | Ht 63.0 in | Wt 156.1 lb

## 2017-06-13 DIAGNOSIS — Z598 Other problems related to housing and economic circumstances: Secondary | ICD-10-CM

## 2017-06-13 DIAGNOSIS — Z0000 Encounter for general adult medical examination without abnormal findings: Secondary | ICD-10-CM | POA: Diagnosis not present

## 2017-06-13 DIAGNOSIS — Z599 Problem related to housing and economic circumstances, unspecified: Secondary | ICD-10-CM

## 2017-06-13 NOTE — Patient Instructions (Signed)
Jessica Burgess , Thank you for taking time to come for your Medicare Wellness Visit. I appreciate your ongoing commitment to your health goals. Please review the following plan we discussed and let me know if I can assist you in the future.   Screening recommendations/referrals: Colorectal Screening: Completed colonoscopy 02/28/14. Repeat every 5 years Mammogram: Completed 03/12/17. Repeat every year Bone Density: Completed 07/06/14. Osteoporotic screenings no longer required Lung Cancer Screening: You will receive a call from [facility] to facilitate scheduling your CT Hepatitis C Screening: Completed 07/27/13  Vision and Dental Exams: Recommended annual ophthalmology exams for early detection of glaucoma and other disorders of the eye Recommended annual dental exams for proper oral hygiene  Vaccinations: Influenza vaccine: Up to date Pneumococcal vaccine: Completed series Tdap vaccine: Up to date Shingles vaccine: Not required until after age 72    Advanced directives: Please bring a copy of your POA (Power of [place]) and/or Living Will to your next appointment.  Conditions/risks identified: Recommend to drink at least 6-8 8oz glasses of water per day.  Next appointment: Please schedule your Annual Wellness Visit with your Nurse Health Advisor in one year.  Preventive Care 40-64 Years, Female Preventive care refers to lifestyle choices and visits with your health care provider that can promote health and wellness. What does preventive care include?  A yearly physical exam. This is also called an annual well check.  Dental exams once or twice a year.  Routine eye exams. Ask your health care provider how often you should have your eyes checked.  Personal lifestyle choices, including:  Daily care of your teeth and gums.  Regular physical activity.  Eating a healthy diet.  Avoiding tobacco and drug use.  Limiting alcohol use.  Practicing safe sex.  Taking low-dose  aspirin daily starting at age 50.  Taking vitamin and mineral supplements as recommended by your health care provider. What happens during an annual well check? The services and screenings done by your health care provider during your annual well check will depend on your age, overall health, lifestyle risk factors, and family history of disease. Counseling  Your health care provider may ask you questions about your:  Alcohol use.  Tobacco use.  Drug use.  Emotional well-being.  Home and relationship well-being.  Sexual activity.  Eating habits.  Work and work Statistician.  Method of birth control.  Menstrual cycle.  Pregnancy history. Screening  You may have the following tests or measurements:  Height, weight, and BMI.  Blood pressure.  Lipid and cholesterol levels. These may be checked every 5 years, or more frequently if you are over 60 years old.  Skin check.  Lung cancer screening. You may have this screening every year starting at age 58 if you have a 30-pack-year history of smoking and currently smoke or have quit within the past 15 years.  Fecal occult blood test (FOBT) of the stool. You may have this test every year starting at age 12.  Flexible sigmoidoscopy or colonoscopy. You may have a sigmoidoscopy every 5 years or a colonoscopy every 10 years starting at age 35.  Hepatitis C blood test.  Hepatitis B blood test.  Sexually transmitted disease (STD) testing.  Diabetes screening. This is done by checking your blood sugar (glucose) after you have not eaten for a while (fasting). You may have this done every 1-3 years.  Mammogram. This may be done every 1-2 years. Talk to your health care provider about when you should start having regular  mammograms. This may depend on whether you have a family history of breast cancer.  BRCA-related cancer screening. This may be done if you have a family history of breast, ovarian, tubal, or peritoneal  cancers.  Pelvic exam and Pap test. This may be done every 3 years starting at age 38. Starting at age 46, this may be done every 5 years if you have a Pap test in combination with an HPV test.  Bone density scan. This is done to screen for osteoporosis. You may have this scan if you are at high risk for osteoporosis. Discuss your test results, treatment options, and if necessary, the need for more tests with your health care provider. Vaccines  Your health care provider may recommend certain vaccines, such as:  Influenza vaccine. This is recommended every year.  Tetanus, diphtheria, and acellular pertussis (Tdap, Td) vaccine. You may need a Td booster every 10 years.  Zoster vaccine. You may need this after age 70.  Pneumococcal 13-valent conjugate (PCV13) vaccine. You may need this if you have certain conditions and were not previously vaccinated.  Pneumococcal polysaccharide (PPSV23) vaccine. You may need one or two doses if you smoke cigarettes or if you have certain conditions. Talk to your health care provider about which screenings and vaccines you need and how often you need them. This information is not intended to replace advice given to you by your health care provider. Make sure you discuss any questions you have with your health care provider. Document Released: 03/31/2015 Document Revised: 11/22/2015 Document Reviewed: 01/03/2015 Elsevier Interactive Patient Education  2017 [place] Prevention in the Home Falls can cause injuries. They can happen to people of all ages. There are many things you can do to make your home safe and to help prevent falls. What can I do on the outside of my home?  Regularly fix the edges of walkways and driveways and fix any cracks.  Remove anything that might make you trip as you walk through a door, such as a raised step or threshold.  Trim any bushes or trees on the path to your home.  Use bright outdoor lighting.  Clear  any walking paths of anything that might make someone trip, such as rocks or tools.  Regularly check to see if handrails are loose or broken. Make sure that both sides of any steps have handrails.  Any raised decks and porches should have guardrails on the edges.  Have any leaves, snow, or ice cleared regularly.  Use sand or salt on walking paths during winter.  Clean up any spills in your garage right away. This includes oil or grease spills. What can I do in the bathroom?  Use night lights.  Install grab bars by the toilet and in the tub and shower. Do not use towel bars as grab bars.  Use non-skid mats or decals in the tub or shower.  If you need to sit down in the shower, use a plastic, non-slip stool.  Keep the floor dry. Clean up any water that spills on the floor as soon as it happens.  Remove soap buildup in the tub or shower regularly.  Attach bath mats securely with double-sided non-slip rug tape.  Do not have throw rugs and other things on the floor that can make you trip. What can I do in the bedroom?  Use night lights.  Make sure that you have a light by your bed that is easy to reach.  Do not use any sheets or blankets that are too big for your bed. They should not hang down onto the floor.  Have a firm chair that has side arms. You can use this for support while you get dressed.  Do not have throw rugs and other things on the floor that can make you trip. What can I do in the kitchen?  Clean up any spills right away.  Avoid walking on wet floors.  Keep items that you use a lot in easy-to-reach places.  If you need to reach something above you, use a strong step stool that has a grab bar.  Keep electrical cords out of the way.  Do not use floor polish or wax that makes floors slippery. If you must use wax, use non-skid floor wax.  Do not have throw rugs and other things on the floor that can make you trip. What can I do with my stairs?  Do not  leave any items on the stairs.  Make sure that there are handrails on both sides of the stairs and use them. Fix handrails that are broken or loose. Make sure that handrails are as long as the stairways.  Check any carpeting to make sure that it is firmly attached to the stairs. Fix any carpet that is loose or worn.  Avoid having throw rugs at the top or bottom of the stairs. If you do have throw rugs, attach them to the floor with carpet tape.  Make sure that you have a light switch at the top of the stairs and the bottom of the stairs. If you do not have them, ask someone to add them for you. What else can I do to help prevent falls?  Wear shoes that:  Do not have high heels.  Have rubber bottoms.  Are comfortable and fit you well.  Are closed at the toe. Do not wear sandals.  If you use a stepladder:  Make sure that it is fully opened. Do not climb a closed stepladder.  Make sure that both sides of the stepladder are locked into place.  Ask someone to hold it for you, if possible.  Clearly mark and make sure that you can see:  Any grab bars or handrails.  First and last steps.  Where the edge of each step is.  Use tools that help you move around (mobility aids) if they are needed. These include:  Canes.  Walkers.  Scooters.  Crutches.  Turn on the lights when you go into a dark area. Replace any light bulbs as soon as they burn out.  Set up your furniture so you have a clear path. Avoid moving your furniture around.  If any of your floors are uneven, fix them.  If there are any pets around you, be aware of where they are.  Review your medicines with your doctor. Some medicines can make you feel dizzy. This can increase your chance of falling. Ask your doctor what other things that you can do to help prevent falls. This information is not intended to replace advice given to you by your health care provider. Make sure you discuss any questions you have with  your health care provider. Document Released: 12/29/2008 Document Revised: 08/10/2015 Document Reviewed: 04/08/2014 Elsevier Interactive Patient Education  2017 [employer].

## 2017-06-13 NOTE — Progress Notes (Addendum)
Subjective:   Jessica Burgess is a 62 y.o. female who presents for an Initial Medicare Annual Wellness Visit.  Review of Systems    N/A  Cardiac Risk Factors include: dyslipidemia;sedentary lifestyle;smoking/ tobacco exposure     Objective:    Today's Vitals   06/13/17 1508  BP: 124/68  Pulse: 77  Resp: 12  Temp: 98.2 F (36.8 C)  TempSrc: Oral  SpO2: 95%  Weight: 156 lb 1.6 oz (70.8 kg)  Height: 5\' 3"  (1.6 m)   Body mass index is 27.65 kg/m.  Advanced Directives 06/13/2017 06/09/2017 05/19/2017 04/02/2017 03/31/2017 02/17/2017 01/27/2017  Does Patient Have a Medical Advance Directive? Yes Yes Yes Yes Yes Yes Yes  Type of Paramedic of [place];Living will [place];Living will [place];Living will Living will;Healthcare Power of Attorney Living will;Healthcare Power of [place];Living will [place];Living will  Does patient want to make changes to medical advance directive? - - No - Patient declined No - Patient declined - No - Patient declined -  Copy of [place] in Chart? No - copy requested - Yes Yes - No - copy requested -  Would patient like information on creating a medical advance directive? - - - - - - -    Current Medications (verified) Outpatient Encounter Medications as of 06/13/2017  Medication Sig  . albuterol (PROVENTIL HFA;VENTOLIN HFA) 108 (90 Base) MCG/ACT inhaler Inhale 2 puffs into the lungs every 6 (six) hours as needed for wheezing or shortness of breath.  Marland Kitchen albuterol (PROVENTIL) (2.5 MG/3ML) 0.083% nebulizer solution Take 3 mLs (2.5 mg total) by nebulization every 6 (six) hours as needed for wheezing or shortness of breath.  . ALPRAZolam (XANAX) 0.25 MG tablet Take 1 tablet (0.25 mg total) by mouth 2 (two) times daily as needed for anxiety.  . Ascorbic Acid (VITAMIN C PO) Take by mouth.  . budesonide-formoterol (SYMBICORT)  160-4.5 MCG/ACT inhaler Inhale 2 puffs into the lungs 2 (two) times daily.  . Cholecalciferol (VITAMIN D-1000 MAX ST) 1000 UNITS tablet Take 1 tablet by mouth 2 (two) times daily.  . Cyanocobalamin (B-12 PO) Take 1 tablet by mouth daily.   . cyclobenzaprine (FLEXERIL) 10 MG tablet TAKE 1 TABLET BY MOUTH THREE TIMES A DAY IF NEEDED FOR MUSCLE SPASM  . DULoxetine (CYMBALTA) 60 MG capsule TAKE 1 CAPSULE BY MOUTH ONCE DAILY  . estradiol (ESTRACE) 0.5 MG tablet take 1 tablet by mouth once daily  . fentaNYL (DURAGESIC - DOSED MCG/HR) 50 MCG/HR Place 1 patch (50 mcg total) onto the skin every 3 (three) days.  . ferrous sulfate (IRON SUPPLEMENT) 325 (65 FE) MG tablet Take 1 tablet by mouth daily.  . fluticasone (FLONASE) 50 MCG/ACT nasal spray Place 2 sprays into both nostrils as needed.  . gabapentin (NEURONTIN) 300 MG capsule Take 1-3 capsules (300-900 mg total) by mouth 3 (three) times daily. 300 mg twice a day and 900 mg at night  . ipratropium (ATROVENT HFA) 17 MCG/ACT inhaler Inhale 2 puffs into the lungs every 6 (six) hours.  . Magnesium Oxide 500 MG CAPS Take 500 mg by mouth 2 (two) times daily.   . Multiple Vitamins-Minerals (MULTIVITAMIN PO) Take 1 tablet by mouth daily.   . Omega-3 Fatty Acids (FISH OIL PO) Take by mouth.  . ondansetron (ZOFRAN) 8 MG tablet Take 1 tablet (8 mg total) by mouth 2 (two) times daily as needed for refractory nausea / vomiting.  Marland Kitchen  oxyCODONE-acetaminophen (PERCOCET) 10-325 MG tablet Take 1 tablet by mouth every 8 (eight) hours as needed for pain.  . potassium gluconate 595 MG TABS tablet Take 595 mg by mouth daily.  . prochlorperazine (COMPAZINE) 10 MG tablet Take 1 tablet (10 mg total) by mouth every 6 (six) hours as needed (Nausea or vomiting).  . SUMAtriptan (IMITREX) 100 MG tablet take 1 tablet by mouth if needed for migraines as directed  . zolpidem (AMBIEN CR) 6.25 MG CR tablet Take 1 tablet (6.25 mg total) by mouth at bedtime.  . [DISCONTINUED] atorvastatin  (LIPITOR) 40 MG tablet take 1 tablet by mouth once daily  . [DISCONTINUED] docusate sodium (COLACE) 100 MG capsule Take 1 capsule (100 mg total) by mouth 2 (two) times daily.  . [DISCONTINUED] levothyroxine (SYNTHROID, LEVOTHROID) 50 MCG tablet Take 1 tablet by mouth daily.  . [DISCONTINUED] lidocaine-prilocaine (EMLA) cream Apply to affected area once  . [DISCONTINUED] montelukast (SINGULAIR) 10 MG tablet Take 10 mg by mouth at bedtime.  . [DISCONTINUED] Polyethylene Glycol 3350 (PEG 3350) POWD Take by mouth.   Facility-Administered Encounter Medications as of 06/13/2017  Medication  . 0.9 %  sodium chloride infusion  . 0.9 %  sodium chloride infusion  . dexamethasone (DECADRON) 20 mg in sodium chloride 0.9 % 50 mL IVPB  . dexamethasone (DECADRON) injection 10 mg    Allergies (verified) Patient has no known allergies.    Hospitalizations, surgeries, and ER visits occurring within the previous 12 months:  Pt was admitted for hypoxia @ [facility] on 11/19/16, underwent surgery for a laparotomy with omental biopsy on 11/05/16 at [place] with Dr. Fransisca Connors.  In addition to the above listed admission and surgery, pt was seen at the Urgent Care for a postop wound check on 11/30/16 and sent to [facility] ED on 11/30/16 for treatment of an abdominal seroma. Pt was also treated on 12/01/16 at [facility] ED for abdominal seroma as well.  History: Past Medical History:  Diagnosis Date  . Allergic rhinitis, cause unspecified   . Anxiety state, unspecified   . Arthritis   . Asthma    only when sick   . Backache, unspecified   . Bronchitis    hx of when get sick  . Cancer ([place])    skin cancer , basal cell   . Cancer ([place]) 11/2016   ovarian  . Cervicalgia   . Complication of anesthesia   . Dermatophytosis of nail   . Dysmetabolic syndrome X   . Encounter for long-term (current) use of other medications   . Esophageal reflux   . Insomnia, unspecified   . Leukocytosis, unspecified   . Migraine  without aura, without mention of intractable migraine without mention of status migrainosus   . Other and unspecified hyperlipidemia   . Other malaise and fatigue   . Overweight(278.02)   . Personal history of chemotherapy now   ovarian  . PONV (postoperative nausea and vomiting)   . Spinal stenosis in cervical region   . Symptomatic menopausal or female climacteric states   . Unspecified disorder of skin and subcutaneous tissue   . Unspecified vitamin D deficiency    Past Surgical History:  Procedure Laterality Date  . ABDOMINAL HYSTERECTOMY    . ANTERIOR CERVICAL DECOMP/DISCECTOMY FUSION N/A 06/07/2015   Procedure: Cervical three - four and Cervical six- seven anterior cervical decompression with fusion interbody prosthesis plating and bonegraft;  Surgeon: Newman Pies, MD;  Location: [place] NEURO ORS;  Service: Neurosurgery;  Laterality: N/A;  C34 and C67 anterior cervical decompression with fusion interbody prosthesis plating and bonegraft  . BACK SURGERY     x2 Lower   . EVACUATION OF CERVICAL HEMATOMA N/A 06/14/2015   Procedure: EVACUATION OF CERVICAL HEMATOMA;  Surgeon: Newman Pies, MD;  Location: [place] NEURO ORS;  Service: Neurosurgery;  Laterality: N/A;  . NECK SURGERY     x3  . TONSILLECTOMY     Family History  Problem Relation Age of Onset  . Depression Mother   . Migraines Mother   . Dementia Father   . Diabetes Father   . Hyperlipidemia Father   . Hyperlipidemia Brother   . Hyperlipidemia Brother   . Breast cancer Paternal Aunt        53s   Social History   Socioeconomic History  . Marital status: Single    Spouse name: Not on file  . Number of children: 0  . Years of education: some college  . Highest education level: 12th grade  Occupational History    Employer: DISABLED  . Occupation: Disabled   Social Needs  . Financial resource strain: Very hard  . Food insecurity:    Worry: Never true    Inability: Never true  . Transportation needs:    Medical:  No    Non-medical: No  Tobacco Use  . Smoking status: Former Smoker    Packs/day: 1.50    Years: 20.00    Pack years: 30.00    Types: Cigarettes    Start date: 03/19/1979    Last attempt to quit: 08/25/1999    Years since quitting: 17.8  . Smokeless tobacco: Never Used  . Tobacco comment: smoking cessation materials not required  Substance and Sexual Activity  . Alcohol use: Not Currently    Alcohol/week: 0.0 oz  . Drug use: No  . Sexual activity: Never  Lifestyle  . Physical activity:    Days per week: 0 days    Minutes per session: 0 min  . Stress: Very much  Relationships  . Social connections:    Talks on phone: Patient refused    Gets together: Patient refused    Attends religious service: Patient refused    Active member of club or organization: Patient refused    Attends meetings of clubs or organizations: Patient refused    Relationship status: Patient refused  Other Topics Concern  . Not on file  Social History Narrative   Patient is single.    Patient lives with roommates.    Patient on disability    Patient has no children.    Patient has some college     Tobacco Counseling Counseling given: No Comment: smoking cessation materials not required   Clinical Intake:  Pre-visit preparation completed: Yes  Pain : No/denies pain   BMI - recorded: 27.65 Nutritional Status: BMI 25 -29 Overweight Nutritional Risks: Nausea/ vomitting/ diarrhea, Unintentional weight loss(r/t to chemo txt's) Diabetes: No  How often do you need to have someone help you when you read instructions, pamphlets, or other written materials from your doctor or pharmacy?: 1 - Never  Interpreter Needed?: No  Information entered by :: AE[place]sole, LPN   Activities of Daily Living In your present state of health, do you have any difficulty performing the following activities: 06/13/2017 04/25/2017  Hearing? N N  Comment denies hearing aids -  Vision? N N  Comment wears eyeglasses -    Difficulty concentrating or making decisions? Y Y  Comment chemo txt's have affected her memory on  Chemo week  Walking or climbing stairs? Y N  Comment dyspnea -  Dressing or bathing? N N  Doing errands, shopping? N N  Preparing Food and eating ? N -  Comment denies dentures -  Using the Toilet? N -  In the past six months, have you accidently leaked urine? N -  Do you have problems with loss of bowel control? N -  Managing your Medications? N -  Managing your Finances? N -  Housekeeping or managing your Housekeeping? N -  Some recent data might be hidden     Immunizations and Health Maintenance Immunization History  Administered Date(s) Administered  . Hepatitis A 06/23/2012, 02/04/2013  . Influenza, Seasonal, Injecte, Preservative Fre 05/14/2012  . Influenza,inj,Quad PF,6+ Mos 11/25/2014, 02/02/2016, 11/12/2016  . Influenza-Unspecified 11/23/2013  . Pneumococcal Conjugate-13 11/12/2016  . Pneumococcal Polysaccharide-23 11/11/2011  . Tdap 11/11/2011   There are no preventive care reminders to display for this patient.  Patient Care Team: Steele Sizer, MD as PCP - General (Family Medicine) Lloyd Huger, MD as Consulting Physician (Oncology) Mellody Drown, MD as Consulting Physician (Obstetrics and Gynecology)  Indicate any recent Medical Services you may have received from other than [facility] providers in the past year (date may be approximate).     Assessment:   This is a routine wellness examination for [place].  Hearing/Vision screen Vision Screening Comments: Sees Dr. Matilde Sprang for annual eye exams  Dietary issues and exercise activities discussed: Current Exercise Habits: The patient does not participate in regular exercise at present, Exercise limited by: Other - see comments(chemo)  Goals    . DIET - INCREASE WATER INTAKE     Recommend to drink at least 6-8 8oz glasses of water per day.    Marland Kitchen DIET - Protein shakes     Pt undergoing chemo and states she  has no appetite during her chemo weeks. Recommend to add a protein shake every day to her diet.      Depression Screen PHQ 2/9 Scores 06/13/2017 04/25/2017 11/12/2016 07/24/2016 05/06/2016 02/02/2016 11/01/2015  PHQ - 2 Score 2 1 - 0 0 2 0  PHQ- 9 Score 9 6 - - - 3 -  Exception Documentation - - Medical reason - - - -     Pt requesting to discuss changing her antidepressant to Prozac. States this helped in the past. Currently taking Cymbalta and has not noticed any positive effects. Advised to schedule an appointment to discuss with either Dr. Ancil Boozer or Dr. Grayland Ormond. Verbalized acceptance and understanding.  Fall Risk Fall Risk  06/13/2017 01/30/2017 01/06/2017 11/12/2016 07/24/2016  Falls in the past year? No No No No No  Number falls in past yr: - - - - -  Comment - - - - -  Injury with Fall? - - - - -  Risk for fall due to : Impaired balance/gait;Impaired vision;Medication side effect - - - -  Risk for fall due to: Comment chemo, wears eyeglasses, anemia - - - -  Follow up - - - - -    Is the home free of loose throw rugs in walkways, pet beds, electrical cords, etc? Yes Adequate lighting to reduce risk of falls?  Yes In addition, does the patient have any of the following: Stairs in or around the home WITH handrails? Yes Grab bars in the bathroom? Yes  Shower chair or a place to sit while bathing? No Use of a cane, walker or w/c? No Use of an elevated toilet seat or  a handicapped toilet? Yes  Timed Get Up and Go Performed: Yes. Pt ambulated 10 feet within 8 sec. Gait slow, steady and without the use of an assistive device. No intervention required at this time. Fall risk prevention has been discussed.  Community Resource Referral sent to Care Guide for financial assistance with medical bills and clothing allowance.  Cognitive Function:     6CIT Screen 06/13/2017  What Year? 0 points  What month? 3 points  What time? 0 points  Count back from 20 0 points  Months in reverse 2 points   Repeat phrase 0 points  Total Score 5    Screening Tests Health Maintenance  Topic Date Due  . MAMMOGRAM  03/12/2018  . COLONOSCOPY  03/01/2019  . PAP SMEAR  10/29/2019  . TETANUS/TDAP  11/10/2021  . INFLUENZA VACCINE  Completed  . Hepatitis C Screening  Completed  . HIV Screening  Completed    Qualifies for Shingles Vaccine? No  Cancer Screenings: Lung: Low Dose CT Chest recommended if Age 46-80 years, 30 pack-year currently smoking OR have quit w/in 15years. Patient does qualify. An Epic message has been sent to Burgess Estelle, RN (Oncology Nurse Navigator) regarding the possible need for this exam. Raquel Sarna will review the patient's chart to determine if the patient truly qualifies for the exam. If the patient qualifies, Raquel Sarna will order the Low Dose CT of the chest to facilitate the scheduling of this exam. Breast: Up to date on Mammogram? Yes. Completed 03/12/17. Repeat every year Up to date of Bone Density/Dexa? Yes. Completed 07/06/14. Osteoporotic screenings no longer required Colorectal: Completed colonoscopy 02/28/14. Repeat every 5 years  Additional Screenings: Hepatitis B/HIV/Syphillis: Completed 11/24/10 Hepatitis C Screening: Completed 07/27/13    Plan:  I have personally reviewed and addressed the Medicare Annual Wellness questionnaire and have noted the following in the patient's chart:  A. Medical and social history B. Use of alcohol, tobacco or illicit drugs  C. Current medications and supplements D. Functional ability and status E.  Nutritional status F.  Physical activity G. Advance directives H. List of other physicians I.  Hospitalizations, surgeries, and ER visits in previous 12 months J.  [place] such as hearing and vision if needed, cognitive and depression L. Referrals and appointments  In addition, I have reviewed and discussed with patient certain preventive protocols, quality metrics, and best practice recommendations. A written  personalized care plan for preventive services as well as general preventive health recommendations were provided to patient.  See attached scanned questionnaire for additional information.   Signed,  Aleatha Borer, LPN Nurse Health Advisor  I have reviewed this encounter including the documentation in this note and/or discussed this patient with the provider, Aleatha Borer, LPN. I am certifying that I agree with the content of this note as supervising physician.  Steele Sizer, MD [facility] 06/15/2017, 6:42 PM

## 2017-06-16 ENCOUNTER — Inpatient Hospital Stay: Payer: PPO | Attending: Oncology

## 2017-06-16 ENCOUNTER — Encounter: Payer: Self-pay | Admitting: Oncology

## 2017-06-16 ENCOUNTER — Inpatient Hospital Stay (HOSPITAL_BASED_OUTPATIENT_CLINIC_OR_DEPARTMENT_OTHER): Payer: PPO | Admitting: Oncology

## 2017-06-16 ENCOUNTER — Inpatient Hospital Stay: Payer: PPO

## 2017-06-16 DIAGNOSIS — Z87891 Personal history of nicotine dependence: Secondary | ICD-10-CM | POA: Insufficient documentation

## 2017-06-16 DIAGNOSIS — G620 Drug-induced polyneuropathy: Secondary | ICD-10-CM | POA: Insufficient documentation

## 2017-06-16 DIAGNOSIS — R110 Nausea: Secondary | ICD-10-CM | POA: Insufficient documentation

## 2017-06-16 DIAGNOSIS — R531 Weakness: Secondary | ICD-10-CM | POA: Insufficient documentation

## 2017-06-16 DIAGNOSIS — F419 Anxiety disorder, unspecified: Secondary | ICD-10-CM

## 2017-06-16 DIAGNOSIS — D649 Anemia, unspecified: Secondary | ICD-10-CM

## 2017-06-16 DIAGNOSIS — D6959 Other secondary thrombocytopenia: Secondary | ICD-10-CM

## 2017-06-16 DIAGNOSIS — M25512 Pain in left shoulder: Secondary | ICD-10-CM | POA: Diagnosis not present

## 2017-06-16 DIAGNOSIS — C7960 Secondary malignant neoplasm of unspecified ovary: Secondary | ICD-10-CM

## 2017-06-16 DIAGNOSIS — C569 Malignant neoplasm of unspecified ovary: Secondary | ICD-10-CM

## 2017-06-16 DIAGNOSIS — D61810 Antineoplastic chemotherapy induced pancytopenia: Secondary | ICD-10-CM | POA: Insufficient documentation

## 2017-06-16 DIAGNOSIS — R52 Pain, unspecified: Secondary | ICD-10-CM | POA: Insufficient documentation

## 2017-06-16 LAB — COMPREHENSIVE METABOLIC PANEL
ALBUMIN: 3.9 g/dL (ref 3.5–5.0)
ALK PHOS: 83 U/L (ref 38–126)
ALT: 17 U/L (ref 14–54)
ANION GAP: 10 (ref 5–15)
AST: 27 U/L (ref 15–41)
BILIRUBIN TOTAL: 0.6 mg/dL (ref 0.3–1.2)
BUN: 17 mg/dL (ref 6–20)
CALCIUM: 8.9 mg/dL (ref 8.9–10.3)
CO2: 26 mmol/L (ref 22–32)
CREATININE: 0.8 mg/dL (ref 0.44–1.00)
Chloride: 99 mmol/L — ABNORMAL LOW (ref 101–111)
GFR calc Af Amer: >60 mL/min (ref >60)
GFR calc non Af Amer: >60 mL/min (ref >60)
GLUCOSE: 127 mg/dL — AB (ref 65–99)
Potassium: 3.6 mmol/L (ref 3.5–5.1)
Sodium: 135 mmol/L (ref 135–145)
TOTAL PROTEIN: 6.9 g/dL (ref 6.5–8.1)

## 2017-06-16 LAB — CBC WITH DIFFERENTIAL/PLATELET
BASOS ABS: 0 10*3/uL (ref 0–0.1)
Basophils Relative: 0 %
EOS ABS: 0.1 10*3/uL (ref 0–0.7)
EOS PCT: 3 %
HCT: 22.6 % — ABNORMAL LOW (ref 35.0–47.0)
Hemoglobin: 8.3 g/dL — ABNORMAL LOW (ref 12.0–16.0)
Lymphocytes Relative: 58 %
Lymphs Abs: 1.8 10*3/uL (ref 1.0–3.6)
MCH: 37.3 pg — ABNORMAL HIGH (ref 26.0–34.0)
MCHC: 36.8 g/dL — AB (ref 32.0–36.0)
MCV: 101.4 fL — ABNORMAL HIGH (ref 80.0–100.0)
MONO ABS: 0.2 10*3/uL (ref 0.2–0.9)
Monocytes Relative: 7 %
Neutro Abs: 1 10*3/uL — ABNORMAL LOW (ref 1.4–6.5)
Neutrophils Relative %: 32 %
PLATELETS: 68 10*3/uL — AB (ref 150–440)
RBC: 2.23 MIL/uL — ABNORMAL LOW (ref 3.80–5.20)
RDW: 16.4 % — AB (ref 11.5–14.5)
WBC: 3.2 10*3/uL — ABNORMAL LOW (ref 3.6–11.0)

## 2017-06-16 MED ORDER — HEPARIN SOD (PORK) LOCK FLUSH 100 UNIT/ML IV SOLN
500.0000 [IU] | Freq: Once | INTRAVENOUS | Status: AC
  Administered 2017-06-16: 500 [IU] via INTRAVENOUS

## 2017-06-16 MED ORDER — SODIUM CHLORIDE 0.9% FLUSH
10.0000 mL | INTRAVENOUS | Status: AC | PRN
  Administered 2017-06-16: 10 mL via INTRAVENOUS
  Filled 2017-06-16: qty 10

## 2017-06-16 MED ORDER — ONDANSETRON HCL 8 MG PO TABS: 8.0000 mg | ORAL_TABLET | Freq: Two times a day (BID) | ORAL | 2 refills | Status: DC | PRN

## 2017-06-16 NOTE — Progress Notes (Signed)
[place]  [phone] [phone]  ID: [name] OB: 03-13-1956  MR#: [id number]  LFY#:[id number]  Patient Care Team: Steele Sizer, MD as PCP - General (Family Medicine) Lloyd Huger, MD as Consulting Physician (Oncology) Mellody Drown, MD as Consulting Physician (Obstetrics and Gynecology)  CHIEF COMPLAINT: Stage IIIc high-grade serous ovarian carcinoma  INTERVAL HISTORY: Patient returns to clinic for further evaluation and re-consideration of cycle 9 of carbo only. She states she is feeling much better after last week. Her weakness, fatigue and shortness of breath have dramatically improved after receiving blood and platelets last week. She continues to have unchanged peripheral neuropathy. She is curious about her lab results today and hoping for treatment. She does admit to occasional nausea and requesting a refill on ondansetron today. She denies chest pain, cough, vomiting, constipation or diarrhea. She admits to occasional nausea. She has no urinary complaints.  REVIEW OF SYSTEMS:   Review of Systems  Constitutional: Negative.  Negative for chills, fever, malaise/fatigue and weight loss.  HENT: Negative for congestion and ear pain.   Eyes: Negative.  Negative for blurred vision and double vision.  Respiratory: Negative.  Negative for cough, sputum production and shortness of breath.   Cardiovascular: Negative.  Negative for chest pain, palpitations and leg swelling.  Gastrointestinal: Positive for nausea. Negative for abdominal pain, constipation, diarrhea and vomiting.  Genitourinary: Negative for dysuria, frequency and urgency.  Musculoskeletal: Negative for back pain and falls.  Skin: Negative for itching and rash.  Neurological: Positive for sensory change (peripheral neuropathy). Negative for weakness and headaches.  Endo/Heme/Allergies: Negative.  Does not bruise/bleed easily.  Psychiatric/Behavioral: Negative.   Negative for depression. The patient is not nervous/anxious and does not have insomnia.     As per HPI. Otherwise, a complete review of systems is negative.  PAST MEDICAL HISTORY: Past Medical History:  Diagnosis Date  . Allergic rhinitis, cause unspecified   . Anxiety state, unspecified   . Arthritis   . Asthma    only when sick   . Backache, unspecified   . Bronchitis    hx of when get sick  . Cancer ([place])    skin cancer , basal cell   . Cancer ([place]) 11/2016   ovarian  . Cervicalgia   . Complication of anesthesia   . Dermatophytosis of nail   . Dysmetabolic syndrome X   . Encounter for long-term (current) use of other medications   . Esophageal reflux   . Insomnia, unspecified   . Leukocytosis, unspecified   . Migraine without aura, without mention of intractable migraine without mention of status migrainosus   . Other and unspecified hyperlipidemia   . Other malaise and fatigue   . Overweight(278.02)   . Personal history of chemotherapy now   ovarian  . PONV (postoperative nausea and vomiting)   . Spinal stenosis in cervical region   . Symptomatic menopausal or female climacteric states   . Unspecified disorder of skin and subcutaneous tissue   . Unspecified vitamin D deficiency     PAST SURGICAL HISTORY: Past Surgical History:  Procedure Laterality Date  . ABDOMINAL HYSTERECTOMY    . ANTERIOR CERVICAL DECOMP/DISCECTOMY FUSION N/A 06/07/2015   Procedure: Cervical three - four and Cervical six- seven anterior cervical decompression with fusion interbody prosthesis plating and bonegraft;  Surgeon: Newman Pies, MD;  Location: [facility];  Service: Neurosurgery;  Laterality: N/A;  C34 and C67 anterior cervical decompression with fusion interbody prosthesis plating and bonegraft  .  BACK SURGERY     x2 Lower   . EVACUATION OF CERVICAL HEMATOMA N/A 06/14/2015   Procedure: EVACUATION OF CERVICAL HEMATOMA;  Surgeon: Newman Pies, MD;  Location: [facility];   Service: Neurosurgery;  Laterality: N/A;  . NECK SURGERY     x3  . TONSILLECTOMY      FAMILY HISTORY: Family History  Problem Relation Age of Onset  . Depression Mother   . Migraines Mother   . Dementia Father   . Diabetes Father   . Hyperlipidemia Father   . Hyperlipidemia Brother   . Hyperlipidemia Brother   . Breast cancer Paternal Aunt        60s    ADVANCED DIRECTIVES (Y/N):  N  HEALTH MAINTENANCE: Social History   Tobacco Use  . Smoking status: Former Smoker    Packs/day: 1.50    Years: 20.00    Pack years: 30.00    Types: Cigarettes    Start date: 03/19/1979    Last attempt to quit: 08/25/1999    Years since quitting: 17.8  . Smokeless tobacco: Never Used  . Tobacco comment: smoking cessation materials not required  Substance Use Topics  . Alcohol use: Not Currently    Alcohol/week: 0.0 oz  . Drug use: No     Colonoscopy:  PAP:  Bone density:  Lipid panel:  No Known Allergies  Current Outpatient Medications  Medication Sig Dispense Refill  . albuterol (PROVENTIL HFA;VENTOLIN HFA) 108 (90 Base) MCG/ACT inhaler Inhale 2 puffs into the lungs every 6 (six) hours as needed for wheezing or shortness of breath. 1 Inhaler 2  . albuterol (PROVENTIL) (2.5 MG/3ML) 0.083% nebulizer solution Take 3 mLs (2.5 mg total) by nebulization every 6 (six) hours as needed for wheezing or shortness of breath. 75 mL 2  . ALPRAZolam (XANAX) 0.25 MG tablet Take 1 tablet (0.25 mg total) by mouth 2 (two) times daily as needed for anxiety. 60 tablet 0  . Ascorbic Acid (VITAMIN C PO) Take by mouth.    . budesonide-formoterol (SYMBICORT) 160-4.5 MCG/ACT inhaler Inhale 2 puffs into the lungs 2 (two) times daily.    . Cholecalciferol (VITAMIN D-1000 MAX ST) 1000 UNITS tablet Take 1 tablet by mouth 2 (two) times daily.    . Cyanocobalamin (B-12 PO) Take 1 tablet by mouth daily.     . cyclobenzaprine (FLEXERIL) 10 MG tablet TAKE 1 TABLET BY MOUTH THREE TIMES A DAY IF NEEDED FOR MUSCLE SPASM  30 tablet 0  . DULoxetine (CYMBALTA) 60 MG capsule TAKE 1 CAPSULE BY MOUTH ONCE DAILY 90 capsule 0  . estradiol (ESTRACE) 0.5 MG tablet take 1 tablet by mouth once daily 90 tablet 4  . fentaNYL (DURAGESIC - DOSED MCG/HR) 50 MCG/HR Place 1 patch (50 mcg total) onto the skin every 3 (three) days. 10 patch 0  . ferrous sulfate (IRON SUPPLEMENT) 325 (65 FE) MG tablet Take 1 tablet by mouth daily.    . fluticasone (FLONASE) 50 MCG/ACT nasal spray Place 2 sprays into both nostrils as needed. 16 g 5  . gabapentin (NEURONTIN) 300 MG capsule Take 1-3 capsules (300-900 mg total) by mouth 3 (three) times daily. 300 mg twice a day and 900 mg at night 540 capsule 1  . ipratropium (ATROVENT HFA) 17 MCG/ACT inhaler Inhale 2 puffs into the lungs every 6 (six) hours.    . Magnesium Oxide 500 MG CAPS Take 500 mg by mouth 2 (two) times daily.     . Multiple Vitamins-Minerals (MULTIVITAMIN PO) Take  1 tablet by mouth daily.     . Omega-3 Fatty Acids (FISH OIL PO) Take by mouth.    . ondansetron (ZOFRAN) 8 MG tablet Take 1 tablet (8 mg total) by mouth 2 (two) times daily as needed for refractory nausea / vomiting. 60 tablet 2  . oxyCODONE-acetaminophen (PERCOCET) 10-325 MG tablet Take 1 tablet by mouth every 8 (eight) hours as needed for pain. 90 tablet 0  . potassium gluconate 595 MG TABS tablet Take 595 mg by mouth daily.    . prochlorperazine (COMPAZINE) 10 MG tablet Take 1 tablet (10 mg total) by mouth every 6 (six) hours as needed (Nausea or vomiting). 60 tablet 2  . SUMAtriptan (IMITREX) 100 MG tablet take 1 tablet by mouth if needed for migraines as directed 27 tablet 1  . zolpidem (AMBIEN CR) 6.25 MG CR tablet Take 1 tablet (6.25 mg total) by mouth at bedtime. 90 tablet 0   No current facility-administered medications for this visit.    Facility-Administered Medications Ordered in Other Visits  Medication Dose Route Frequency Provider Last Rate Last Dose  . 0.9 %  sodium chloride infusion   Intravenous Once  Lloyd Huger, MD      . 0.9 %  sodium chloride infusion   Intravenous Once Lloyd Huger, MD      . dexamethasone (DECADRON) 20 mg in sodium chloride 0.9 % 50 mL IVPB  20 mg Intravenous Once Lloyd Huger, MD      . dexamethasone (DECADRON) injection 10 mg  10 mg Intravenous Once Lloyd Huger, MD      . heparin lock flush 100 unit/mL  500 Units Intravenous Once Lloyd Huger, MD      . sodium chloride flush (NS) 0.9 % injection 10 mL  10 mL Intravenous PRN Lloyd Huger, MD   10 mL at 06/16/17 0854    OBJECTIVE: Vitals:   06/16/17 0907  BP: 120/76  Pulse: 78  Resp: 16  Temp: (!) 97.3 F (36.3 C)     Body mass index is 27.81 kg/m.    ECOG FS:0 - Asymptomatic  Physical Exam  Constitutional: She is oriented to person, place, and time and well-developed, well-nourished, and in no distress. Vital signs are normal.  HENT:  Head: Normocephalic and atraumatic.  Eyes: Pupils are equal, round, and reactive to light.  Neck: Normal range of motion.  Cardiovascular: Normal rate, regular rhythm and normal heart sounds.  No murmur heard. Pulmonary/Chest: Effort normal and breath sounds normal. She has no wheezes.  Abdominal: Soft. Normal appearance and bowel sounds are normal. She exhibits no distension. There is no tenderness.  Musculoskeletal: Normal range of motion. She exhibits no edema.  Neurological: She is alert and oriented to person, place, and time. Gait normal.  Skin: Skin is warm and dry. No rash noted.  Psychiatric: Mood, memory, affect and judgment normal.     LAB RESULTS:  Lab Results  Component Value Date   NA 135 06/16/2017   K 3.6 06/16/2017   CL 99 (L) 06/16/2017   CO2 26 06/16/2017   GLUCOSE 127 (H) 06/16/2017   BUN 17 06/16/2017   CREATININE 0.80 06/16/2017   CALCIUM 8.9 06/16/2017   PROT 6.9 06/16/2017   ALBUMIN 3.9 06/16/2017   AST 27 06/16/2017   ALT 17 06/16/2017   ALKPHOS 83 06/16/2017   BILITOT 0.6 06/16/2017    GFRNONAA >60 06/16/2017   GFRAA >60 06/16/2017    Lab Results  Component Value  Date   WBC 3.2 (L) 06/16/2017   NEUTROABS 1.0 (L) 06/16/2017   HGB 8.3 (L) 06/16/2017   HCT 22.6 (L) 06/16/2017   MCV 101.4 (H) 06/16/2017   PLT 68 (L) 06/16/2017     STUDIES: Mr Shoulder Right Wo Contrast  Result Date: 06/11/2017 CLINICAL DATA:  Status post fall. Generalized shoulder pain. Getting worse. EXAM: MRI OF THE RIGHT SHOULDER WITHOUT CONTRAST TECHNIQUE: Multiplanar, multisequence MR imaging of the shoulder was performed. No intravenous contrast was administered. COMPARISON:  None. FINDINGS: Patient motion degrading image quality limiting evaluation. Rotator cuff: Complete tear of the supraspinatus tendon with 3.3 cm of retraction. Moderate tendinosis of the infraspinatus tendon without a discrete tear. Teres minor tendon is intact. Subscapularis tendon is intact. Muscles: Mild muscle atrophy of the supraspinatus muscle. Remainder the rotator cuff muscles demonstrate no significant atrophy. Remainder of the muscles surrounding the right shoulder are normal. Biceps long head: Severe tendinosis of the intra-articular portion of the long head of the biceps tendon. Acromioclavicular Joint: Mild arthropathy of the acromioclavicular joint. Type I acromion. Small amount of subacromial/subdeltoid bursal fluid. Glenohumeral Joint: No joint effusion. Generalized chondral thinning. Labrum:  No discrete labral tear. Bones:  No acute osseous abnormality.  No aggressive osseous lesion. Other: No fluid collection or hematoma. IMPRESSION: Patient motion degrades image quality limiting evaluation. 1. Complete tear of the supraspinatus tendon with 3.3 cm of retraction. Mild muscle atrophy of the supraspinatus muscle. 2. Moderate tendinosis of the infraspinatus tendon without a discrete tear. 3. Severe tendinosis of the intra-articular portion of the long head of the biceps tendon. Electronically Signed   By: Kathreen Devoid   On:  06/11/2017 09:20    ASSESSMENT: Stage IIIc high-grade serous ovarian carcinoma  PLAN:  1. Stage IIIc high-grade serous ovarian carcinoma:  She underwent debulking surgery at [facility] on November 19, 2016. She was also initiated on a clinical trial and has received preoperative infusion of Keytruda on November 11, 2016.  Because of patient's persistent peripheral neuropathy as well as pancytopenia, Taxol was discussed continued altogether.  Ca 125 is now trending back down. Patient's Norma Fredrickson was delayed secondary to pancytopenia last week. She was to return this week for reconsideration of cycle 9. Labs improved dramatically but unfortunately her platelet count continues to be low. Platelet count today is 68. We will continue to delay cycle until later in the week. Return to clinic on Thursday for reconsideration of cycle 9 per patient's request. Per treatment plan hold chemotherapy if platelets are less than 75,000.  2. Pain: Continue fentanyl patch and oxycodone as needed.  Previously discussed with patient about referring back to PCP for pain management at conclusion of therapy.   3. Diarrhea: Resolved. Continue Imodium as needed. 4. Anxiety: Continue Xanax as prescribed. 5. Peripheral neuropathy: Discontinue Taxol as above.  Continue gabapentin as prescribed. 6. Thrombocytopenia: Better. Platelet count 68 today. Patient would like to be reevaluated towards the end of the week to see if #improved enough for her to receive treatment this week. Have scheduled her for Thursday at 8:30 AM.  7.  Anemia: Improved. Hemoglobin 8.3 today.  8. Nausea: Refilled Zofran.    Patient expressed understanding and was in agreement with this plan. She also understands that She can call clinic at any time with any questions, concerns, or complaints.   Cancer Staging Ovarian cancer, unspecified laterality ([place]) Staging form: Ovary, Fallopian Tube, and Primary Peritoneal Carcinoma, AJCC 8th Edition - Clinical:  No stage assigned - Unsigned  Jacquelin Hawking, NP   06/16/2017 9:15 AM

## 2017-06-16 NOTE — Progress Notes (Signed)
Patient here today for follow up with labs and treatment with Carboplatin. She reports having a lot of nausea, which is worse in the early morning and mid afternoon. She is requesting a refill of her ondansetron.

## 2017-06-17 ENCOUNTER — Inpatient Hospital Stay: Payer: PPO

## 2017-06-17 LAB — CA 125: Cancer Antigen (CA) 125: 33.4 U/mL (ref 0.0–38.1)

## 2017-06-18 ENCOUNTER — Other Ambulatory Visit: Payer: Self-pay | Admitting: Sports Medicine

## 2017-06-18 DIAGNOSIS — M67911 Unspecified disorder of synovium and tendon, right shoulder: Secondary | ICD-10-CM | POA: Diagnosis not present

## 2017-06-18 DIAGNOSIS — M25511 Pain in right shoulder: Principal | ICD-10-CM

## 2017-06-18 DIAGNOSIS — M75121 Complete rotator cuff tear or rupture of right shoulder, not specified as traumatic: Secondary | ICD-10-CM | POA: Diagnosis not present

## 2017-06-18 DIAGNOSIS — M7542 Impingement syndrome of left shoulder: Secondary | ICD-10-CM | POA: Diagnosis not present

## 2017-06-18 DIAGNOSIS — M25512 Pain in left shoulder: Secondary | ICD-10-CM | POA: Diagnosis not present

## 2017-06-18 DIAGNOSIS — G8929 Other chronic pain: Secondary | ICD-10-CM

## 2017-06-19 ENCOUNTER — Inpatient Hospital Stay: Payer: PPO

## 2017-06-19 ENCOUNTER — Inpatient Hospital Stay (HOSPITAL_BASED_OUTPATIENT_CLINIC_OR_DEPARTMENT_OTHER): Payer: PPO | Admitting: Oncology

## 2017-06-19 ENCOUNTER — Encounter: Payer: Self-pay | Admitting: Oncology

## 2017-06-19 ENCOUNTER — Ambulatory Visit
Admission: RE | Admit: 2017-06-19 | Discharge: 2017-06-19 | Disposition: A | Discharge status: Home / Self Care | Payer: PPO | Source: Ambulatory Visit | Attending: Oncology | Admitting: Oncology

## 2017-06-19 VITALS — BP 123/72 | HR 86 | Temp 97.4°F | Resp 16 | Wt 157.0 lb

## 2017-06-19 DIAGNOSIS — C569 Malignant neoplasm of unspecified ovary: Secondary | ICD-10-CM | POA: Insufficient documentation

## 2017-06-19 DIAGNOSIS — D702 Other drug-induced agranulocytosis: Secondary | ICD-10-CM | POA: Diagnosis not present

## 2017-06-19 DIAGNOSIS — R918 Other nonspecific abnormal finding of lung field: Secondary | ICD-10-CM | POA: Diagnosis not present

## 2017-06-19 DIAGNOSIS — G620 Drug-induced polyneuropathy: Secondary | ICD-10-CM

## 2017-06-19 DIAGNOSIS — R9389 Abnormal findings on diagnostic imaging of other specified body structures: Secondary | ICD-10-CM | POA: Diagnosis not present

## 2017-06-19 DIAGNOSIS — C7960 Secondary malignant neoplasm of unspecified ovary: Secondary | ICD-10-CM

## 2017-06-19 DIAGNOSIS — Z5111 Encounter for antineoplastic chemotherapy: Secondary | ICD-10-CM | POA: Diagnosis not present

## 2017-06-19 DIAGNOSIS — M545 Low back pain: Secondary | ICD-10-CM | POA: Diagnosis not present

## 2017-06-19 DIAGNOSIS — I700 Atherosclerosis of aorta: Secondary | ICD-10-CM | POA: Insufficient documentation

## 2017-06-19 DIAGNOSIS — K838 Other specified diseases of biliary tract: Secondary | ICD-10-CM | POA: Diagnosis not present

## 2017-06-19 DIAGNOSIS — D649 Anemia, unspecified: Secondary | ICD-10-CM | POA: Diagnosis not present

## 2017-06-19 LAB — COMPREHENSIVE METABOLIC PANEL
ALBUMIN: 4 g/dL (ref 3.5–5.0)
ALT: 19 U/L (ref 14–54)
ANION GAP: 10 (ref 5–15)
AST: 28 U/L (ref 15–41)
Alkaline Phosphatase: 96 U/L (ref 38–126)
BUN: 18 mg/dL (ref 6–20)
CO2: 27 mmol/L (ref 22–32)
Calcium: 9 mg/dL (ref 8.9–10.3)
Chloride: 98 mmol/L — ABNORMAL LOW (ref 101–111)
Creatinine, Ser: 0.75 mg/dL (ref 0.44–1.00)
GFR calc non Af Amer: >60 mL/min (ref >60)
GLUCOSE: 99 mg/dL (ref 65–99)
POTASSIUM: 3.8 mmol/L (ref 3.5–5.1)
SODIUM: 135 mmol/L (ref 135–145)
Total Bilirubin: 0.5 mg/dL (ref 0.3–1.2)
Total Protein: 7 g/dL (ref 6.5–8.1)

## 2017-06-19 LAB — CBC WITH DIFFERENTIAL/PLATELET
BASOS ABS: 0 10*3/uL (ref 0–0.1)
BASOS PCT: 1 %
Eosinophils Absolute: 0 10*3/uL (ref 0–0.7)
Eosinophils Relative: 2 %
HCT: 23.4 % — ABNORMAL LOW (ref 35.0–47.0)
HEMOGLOBIN: 8.5 g/dL — AB (ref 12.0–16.0)
LYMPHS PCT: 61 %
Lymphs Abs: 1.7 10*3/uL (ref 1.0–3.6)
MCH: 37.3 pg — ABNORMAL HIGH (ref 26.0–34.0)
MCHC: 36.2 g/dL — AB (ref 32.0–36.0)
MCV: 103.1 fL — AB (ref 80.0–100.0)
MONO ABS: 0.3 10*3/uL (ref 0.2–0.9)
MONOS PCT: 12 %
NEUTROS ABS: 0.6 10*3/uL — AB (ref 1.4–6.5)
NEUTROS PCT: 24 %
Platelets: 129 10*3/uL — ABNORMAL LOW (ref 150–440)
RBC: 2.27 MIL/uL — ABNORMAL LOW (ref 3.80–5.20)
RDW: 17.6 % — AB (ref 11.5–14.5)
WBC: 2.7 10*3/uL — ABNORMAL LOW (ref 3.6–11.0)

## 2017-06-19 MED ORDER — IOPAMIDOL (ISOVUE-300) INJECTION 61%
100.0000 mL | Freq: Once | INTRAVENOUS | Status: AC | PRN
  Administered 2017-06-19: 100 mL via INTRAVENOUS

## 2017-06-19 MED ORDER — HEPARIN SOD (PORK) LOCK FLUSH 100 UNIT/ML IV SOLN
500.0000 [IU] | Freq: Once | INTRAVENOUS | Status: AC
  Administered 2017-06-19: 500 [IU] via INTRAVENOUS

## 2017-06-19 NOTE — Progress Notes (Signed)
[place]  [phone] [phone]  ID: [name] OB: 09-Oct-1955  MR#: [id number]  ZDG#:[id number]  Patient Care Team: Steele Sizer, MD as PCP - General (Family Medicine) Lloyd Huger, MD as Consulting Physician (Oncology) Mellody Drown, MD as Consulting Physician (Obstetrics and Gynecology)  CHIEF COMPLAINT: Stage IIIc high-grade serous ovarian carcinoma  INTERVAL HISTORY:Patient returns to clinic for further evaluation and reconsideration of cycle 9 of carbo only. Continues to feel better. She continues to have a chronic weakness and fatigue but her shortness of breath has dramatically improved and continues to do so. Peripheral neuropathy improving. She is here for repeat lab work from Monday due to thrombocytopenia that prevented her from having treatment. She denies chest pain, cough, vomiting, constipation or diarrhea. Continues to have occasional nausea. Otherwise denies any further complaints.  REVIEW OF SYSTEMS:   Review of Systems  Constitutional: Positive for malaise/fatigue. Negative for chills, fever and weight loss.  HENT: Negative for congestion and ear pain.   Eyes: Negative.  Negative for blurred vision and double vision.  Respiratory: Negative.  Negative for cough, sputum production and shortness of breath.   Cardiovascular: Negative.  Negative for chest pain, palpitations and leg swelling.  Gastrointestinal: Negative.  Negative for abdominal pain, constipation, diarrhea, nausea and vomiting.  Genitourinary: Negative for dysuria, frequency and urgency.  Musculoskeletal: Negative for back pain and falls.  Skin: Negative.  Negative for rash.  Neurological: Negative.  Negative for weakness and headaches.  Endo/Heme/Allergies: Negative.  Does not bruise/bleed easily.  Psychiatric/Behavioral: Negative.  Negative for depression. The patient is not nervous/anxious and does not have insomnia.     As per HPI.  Otherwise, a complete review of systems is negative.  PAST MEDICAL HISTORY: Past Medical History:  Diagnosis Date  . Allergic rhinitis, cause unspecified   . Anxiety state, unspecified   . Arthritis   . Asthma    only when sick   . Backache, unspecified   . Bronchitis    hx of when get sick  . Cancer ([place])    skin cancer , basal cell   . Cancer ([place]) 11/2016   ovarian  . Cervicalgia   . Complication of anesthesia   . Dermatophytosis of nail   . Dysmetabolic syndrome X   . Encounter for long-term (current) use of other medications   . Esophageal reflux   . Insomnia, unspecified   . Leukocytosis, unspecified   . Migraine without aura, without mention of intractable migraine without mention of status migrainosus   . Other and unspecified hyperlipidemia   . Other malaise and fatigue   . Overweight(278.02)   . Personal history of chemotherapy now   ovarian  . PONV (postoperative nausea and vomiting)   . Spinal stenosis in cervical region   . Symptomatic menopausal or female climacteric states   . Unspecified disorder of skin and subcutaneous tissue   . Unspecified vitamin D deficiency     PAST SURGICAL HISTORY: Past Surgical History:  Procedure Laterality Date  . ABDOMINAL HYSTERECTOMY    . ANTERIOR CERVICAL DECOMP/DISCECTOMY FUSION N/A 06/07/2015   Procedure: Cervical three - four and Cervical six- seven anterior cervical decompression with fusion interbody prosthesis plating and bonegraft;  Surgeon: Newman Pies, MD;  Location: [facility];  Service: Neurosurgery;  Laterality: N/A;  C34 and C67 anterior cervical decompression with fusion interbody prosthesis plating and bonegraft  . BACK SURGERY     x2 Lower   . EVACUATION OF CERVICAL HEMATOMA N/A  06/14/2015   Procedure: EVACUATION OF CERVICAL HEMATOMA;  Surgeon: Newman Pies, MD;  Location: [facility];  Service: Neurosurgery;  Laterality: N/A;  . NECK SURGERY     x3  . TONSILLECTOMY      FAMILY  HISTORY: Family History  Problem Relation Age of Onset  . Depression Mother   . Migraines Mother   . Dementia Father   . Diabetes Father   . Hyperlipidemia Father   . Hyperlipidemia Brother   . Hyperlipidemia Brother   . Breast cancer Paternal Aunt        88s    ADVANCED DIRECTIVES (Y/N):  N  HEALTH MAINTENANCE: Social History   Tobacco Use  . Smoking status: Former Smoker    Packs/day: 1.50    Years: 20.00    Pack years: 30.00    Types: Cigarettes    Start date: 03/19/1979    Last attempt to quit: 08/25/1999    Years since quitting: 17.8  . Smokeless tobacco: Never Used  . Tobacco comment: smoking cessation materials not required  Substance Use Topics  . Alcohol use: Not Currently    Alcohol/week: 0.0 oz  . Drug use: No     Colonoscopy:  PAP:  Bone density:  Lipid panel:  No Known Allergies  Current Outpatient Medications  Medication Sig Dispense Refill  . albuterol (PROVENTIL HFA;VENTOLIN HFA) 108 (90 Base) MCG/ACT inhaler Inhale 2 puffs into the lungs every 6 (six) hours as needed for wheezing or shortness of breath. 1 Inhaler 2  . albuterol (PROVENTIL) (2.5 MG/3ML) 0.083% nebulizer solution Take 3 mLs (2.5 mg total) by nebulization every 6 (six) hours as needed for wheezing or shortness of breath. 75 mL 2  . ALPRAZolam (XANAX) 0.25 MG tablet Take 1 tablet (0.25 mg total) by mouth 2 (two) times daily as needed for anxiety. 60 tablet 0  . Ascorbic Acid (VITAMIN C PO) Take by mouth.    . budesonide-formoterol (SYMBICORT) 160-4.5 MCG/ACT inhaler Inhale 2 puffs into the lungs 2 (two) times daily.    . Cholecalciferol (VITAMIN D-1000 MAX ST) 1000 UNITS tablet Take 1 tablet by mouth 2 (two) times daily.    . Cyanocobalamin (B-12 PO) Take 1 tablet by mouth daily.     . cyclobenzaprine (FLEXERIL) 10 MG tablet TAKE 1 TABLET BY MOUTH THREE TIMES A DAY IF NEEDED FOR MUSCLE SPASM 30 tablet 0  . DULoxetine (CYMBALTA) 60 MG capsule TAKE 1 CAPSULE BY MOUTH ONCE DAILY 90 capsule 0   . estradiol (ESTRACE) 0.5 MG tablet take 1 tablet by mouth once daily 90 tablet 4  . fentaNYL (DURAGESIC - DOSED MCG/HR) 50 MCG/HR Place 1 patch (50 mcg total) onto the skin every 3 (three) days. 10 patch 0  . ferrous sulfate (IRON SUPPLEMENT) 325 (65 FE) MG tablet Take 1 tablet by mouth daily.    . fluticasone (FLONASE) 50 MCG/ACT nasal spray Place 2 sprays into both nostrils as needed. 16 g 5  . gabapentin (NEURONTIN) 300 MG capsule Take 1-3 capsules (300-900 mg total) by mouth 3 (three) times daily. 300 mg twice a day and 900 mg at night 540 capsule 1  . ipratropium (ATROVENT HFA) 17 MCG/ACT inhaler Inhale 2 puffs into the lungs every 6 (six) hours.    . Magnesium Oxide 500 MG CAPS Take 500 mg by mouth 2 (two) times daily.     . Multiple Vitamins-Minerals (MULTIVITAMIN PO) Take 1 tablet by mouth daily.     . Omega-3 Fatty Acids (FISH OIL PO)  Take by mouth.    . ondansetron (ZOFRAN) 8 MG tablet Take 1 tablet (8 mg total) by mouth 2 (two) times daily as needed for refractory nausea / vomiting. 60 tablet 2  . oxyCODONE-acetaminophen (PERCOCET) 10-325 MG tablet Take 1 tablet by mouth every 8 (eight) hours as needed for pain. 90 tablet 0  . potassium gluconate 595 MG TABS tablet Take 595 mg by mouth daily.    . prochlorperazine (COMPAZINE) 10 MG tablet Take 1 tablet (10 mg total) by mouth every 6 (six) hours as needed (Nausea or vomiting). 60 tablet 2  . SUMAtriptan (IMITREX) 100 MG tablet take 1 tablet by mouth if needed for migraines as directed 27 tablet 1  . zolpidem (AMBIEN CR) 6.25 MG CR tablet Take 1 tablet (6.25 mg total) by mouth at bedtime. 90 tablet 0   No current facility-administered medications for this visit.    Facility-Administered Medications Ordered in Other Visits  Medication Dose Route Frequency Provider Last Rate Last Dose  . 0.9 %  sodium chloride infusion   Intravenous Once Lloyd Huger, MD      . 0.9 %  sodium chloride infusion   Intravenous Once Lloyd Huger, MD      . dexamethasone (DECADRON) 20 mg in sodium chloride 0.9 % 50 mL IVPB  20 mg Intravenous Once Lloyd Huger, MD      . dexamethasone (DECADRON) injection 10 mg  10 mg Intravenous Once Lloyd Huger, MD      . sodium chloride flush (NS) 0.9 % injection 10 mL  10 mL Intravenous PRN Lloyd Huger, MD   10 mL at 06/16/17 0854    OBJECTIVE: Vitals:   06/19/17 0845  BP: 123/72  Pulse: 86  Resp: 16  Temp: (!) 97.4 F (36.3 C)  SpO2: 97%     Body mass index is 27.81 kg/m.    ECOG FS:0 - Asymptomatic  Physical Exam  Constitutional: She is oriented to person, place, and time and well-developed, well-nourished, and in no distress. Vital signs are normal.  HENT:  Head: Normocephalic and atraumatic.  Eyes: Pupils are equal, round, and reactive to light.  Neck: Normal range of motion.  Cardiovascular: Normal rate, regular rhythm and normal heart sounds.  No murmur heard. Pulmonary/Chest: Effort normal and breath sounds normal. She has no wheezes.  Abdominal: Soft. Normal appearance and bowel sounds are normal. She exhibits no distension. There is no tenderness.  Musculoskeletal: Normal range of motion. She exhibits no edema.  Neurological: She is alert and oriented to person, place, and time. Gait normal.  Skin: Skin is warm and dry. No rash noted.  Psychiatric: Mood, memory, affect and judgment normal.     LAB RESULTS:  Lab Results  Component Value Date   NA 135 06/19/2017   K 3.8 06/19/2017   CL 98 (L) 06/19/2017   CO2 27 06/19/2017   GLUCOSE 99 06/19/2017   BUN 18 06/19/2017   CREATININE 0.75 06/19/2017   CALCIUM 9.0 06/19/2017   PROT 7.0 06/19/2017   ALBUMIN 4.0 06/19/2017   AST 28 06/19/2017   ALT 19 06/19/2017   ALKPHOS 96 06/19/2017   BILITOT 0.5 06/19/2017   GFRNONAA >60 06/19/2017   GFRAA >60 06/19/2017    Lab Results  Component Value Date   WBC 2.7 (L) 06/19/2017   NEUTROABS 0.6 (L) 06/19/2017   HGB 8.5 (L) 06/19/2017   HCT 23.4 (L)  06/19/2017   MCV 103.1 (H) 06/19/2017   PLT  129 (L) 06/19/2017     STUDIES: Ct Chest W Contrast  Result Date: 06/19/2017 CLINICAL DATA:  Restaging of ovarian cancer. Shortness of breath and cough. Chemotherapy. EXAM: CT CHEST, ABDOMEN, AND PELVIS WITH CONTRAST TECHNIQUE: Multidetector CT imaging of the chest, abdomen and pelvis was performed following the standard protocol during bolus administration of intravenous contrast. CONTRAST:  155mL ISOVUE-300 IOPAMIDOL (ISOVUE-300) INJECTION 61% COMPARISON:  Multiple exams, including 04/16/2017 FINDINGS: CT CHEST FINDINGS Cardiovascular: Atherosclerotic calcification of the aortic arch and branch vasculature. Right Port-A-Cath tip: SVC. Mediastinum/Nodes: No pathologic adenopathy observed. Lungs/Pleura: 3 by 4 mm right middle lobe subpleural nodule on image 85/4 appears stable. Mild subsegmental atelectasis along both hemidiaphragms. Calcified granuloma in the lingula. Two small nodular densities anteriorly in the right lower lobe appear new, 1 measuring 0.8 by 0.3 cm on image 103/4 and an adjacent nodular density measuring 0.8 by 0.5 cm on image 99. Both of these are along a band of atelectasis. Although these could be from airway plugging or a similar benign etiology, metastatic lesions are not readily excluded. Musculoskeletal: Lower cervical plate and screw fixator. Thoracic spondylosis and degenerative disc disease. No compelling findings of osseous metastatic disease in the thoracic spine. CT ABDOMEN PELVIS FINDINGS Hepatobiliary: Extrahepatic biliary dilatation with common bile duct up to 1.3 cm, essentially stable. Cholecystectomy noted. The liver appears otherwise unremarkable. Pancreas: Unremarkable Spleen: Unremarkable Adrenals/Urinary Tract: Adrenal glands normal. The kidneys and urinary bladder appear normal. Stomach/Bowel: Prominent stool throughout the colon favors constipation. Vascular/Lymphatic: Aortoiliac atherosclerotic vascular disease. Small  retroperitoneal lymph nodes are not pathologically enlarged. Reproductive: Hysterectomy. Stable indistinct density along the top of the vaginal cuff. Other: Postoperative findings from prior omentectomy. Minimal residual nodularity along the left upper quadrant omentum, with small nodules in this vicinity measuring up to 6 mm in long axis. This nodularity was previously similar. Musculoskeletal: Postoperative findings in the lower lumbar spine. Lumbar spondylosis and degenerative disc disease. IMPRESSION: 1. Essentially stable minimal residual nodularity in the left upper quadrant omentum, meriting surveillance. 2. There are 2 new small nodular densities anteriorly in the right lower lobe, the larger measuring 0.8 by 0.5 cm. These are along a band of atelectasis, and could be due to airway plugging or a similar benign lesion, but could also be metastatic and merit surveillance. 3. Stable indistinct density along the vaginal cuff. 4. Other imaging findings of potential clinical significance: Aortic Atherosclerosis (ICD10-I70.0). Prominent stool throughout the colon favors constipation. Electronically Signed   By: Van Clines M.D.   On: 06/19/2017 12:19   Ct Abdomen Pelvis W Contrast  Result Date: 06/19/2017 CLINICAL DATA:  Restaging of ovarian cancer. Shortness of breath and cough. Chemotherapy. EXAM: CT CHEST, ABDOMEN, AND PELVIS WITH CONTRAST TECHNIQUE: Multidetector CT imaging of the chest, abdomen and pelvis was performed following the standard protocol during bolus administration of intravenous contrast. CONTRAST:  145mL ISOVUE-300 IOPAMIDOL (ISOVUE-300) INJECTION 61% COMPARISON:  Multiple exams, including 04/16/2017 FINDINGS: CT CHEST FINDINGS Cardiovascular: Atherosclerotic calcification of the aortic arch and branch vasculature. Right Port-A-Cath tip: SVC. Mediastinum/Nodes: No pathologic adenopathy observed. Lungs/Pleura: 3 by 4 mm right middle lobe subpleural nodule on image 85/4 appears stable.  Mild subsegmental atelectasis along both hemidiaphragms. Calcified granuloma in the lingula. Two small nodular densities anteriorly in the right lower lobe appear new, 1 measuring 0.8 by 0.3 cm on image 103/4 and an adjacent nodular density measuring 0.8 by 0.5 cm on image 99. Both of these are along a band of atelectasis. Although these could be from airway  plugging or a similar benign etiology, metastatic lesions are not readily excluded. Musculoskeletal: Lower cervical plate and screw fixator. Thoracic spondylosis and degenerative disc disease. No compelling findings of osseous metastatic disease in the thoracic spine. CT ABDOMEN PELVIS FINDINGS Hepatobiliary: Extrahepatic biliary dilatation with common bile duct up to 1.3 cm, essentially stable. Cholecystectomy noted. The liver appears otherwise unremarkable. Pancreas: Unremarkable Spleen: Unremarkable Adrenals/Urinary Tract: Adrenal glands normal. The kidneys and urinary bladder appear normal. Stomach/Bowel: Prominent stool throughout the colon favors constipation. Vascular/Lymphatic: Aortoiliac atherosclerotic vascular disease. Small retroperitoneal lymph nodes are not pathologically enlarged. Reproductive: Hysterectomy. Stable indistinct density along the top of the vaginal cuff. Other: Postoperative findings from prior omentectomy. Minimal residual nodularity along the left upper quadrant omentum, with small nodules in this vicinity measuring up to 6 mm in long axis. This nodularity was previously similar. Musculoskeletal: Postoperative findings in the lower lumbar spine. Lumbar spondylosis and degenerative disc disease. IMPRESSION: 1. Essentially stable minimal residual nodularity in the left upper quadrant omentum, meriting surveillance. 2. There are 2 new small nodular densities anteriorly in the right lower lobe, the larger measuring 0.8 by 0.5 cm. These are along a band of atelectasis, and could be due to airway plugging or a similar benign lesion, but  could also be metastatic and merit surveillance. 3. Stable indistinct density along the vaginal cuff. 4. Other imaging findings of potential clinical significance: Aortic Atherosclerosis (ICD10-I70.0). Prominent stool throughout the colon favors constipation. Electronically Signed   By: Van Clines M.D.   On: 06/19/2017 12:19   Mr Shoulder Right Wo Contrast  Result Date: 06/11/2017 CLINICAL DATA:  Status post fall. Generalized shoulder pain. Getting worse. EXAM: MRI OF THE RIGHT SHOULDER WITHOUT CONTRAST TECHNIQUE: Multiplanar, multisequence MR imaging of the shoulder was performed. No intravenous contrast was administered. COMPARISON:  None. FINDINGS: Patient motion degrading image quality limiting evaluation. Rotator cuff: Complete tear of the supraspinatus tendon with 3.3 cm of retraction. Moderate tendinosis of the infraspinatus tendon without a discrete tear. Teres minor tendon is intact. Subscapularis tendon is intact. Muscles: Mild muscle atrophy of the supraspinatus muscle. Remainder the rotator cuff muscles demonstrate no significant atrophy. Remainder of the muscles surrounding the right shoulder are normal. Biceps long head: Severe tendinosis of the intra-articular portion of the long head of the biceps tendon. Acromioclavicular Joint: Mild arthropathy of the acromioclavicular joint. Type I acromion. Small amount of subacromial/subdeltoid bursal fluid. Glenohumeral Joint: No joint effusion. Generalized chondral thinning. Labrum:  No discrete labral tear. Bones:  No acute osseous abnormality.  No aggressive osseous lesion. Other: No fluid collection or hematoma. IMPRESSION: Patient motion degrades image quality limiting evaluation. 1. Complete tear of the supraspinatus tendon with 3.3 cm of retraction. Mild muscle atrophy of the supraspinatus muscle. 2. Moderate tendinosis of the infraspinatus tendon without a discrete tear. 3. Severe tendinosis of the intra-articular portion of the long head of  the biceps tendon. Electronically Signed   By: Kathreen Devoid   On: 06/11/2017 09:20    ASSESSMENT: Stage IIIc high-grade serous ovarian carcinoma  PLAN:  1. Stage IIIc high-grade serous ovarian carcinoma:  She underwent debulking surgery at [facility] on November 19, 2016. She was also initiated on a clinical trial and has received preoperative infusion of Keytruda on November 11, 2016.  Because of patient's persistent peripheral neuropathy as well as pancytopenia, Taxol was discussed continued altogether.  Ca 125 is now trending back down and has remained normal the past 3 lab draws. Patient's carbo delayed on Monday due to  thrombocytopenia. Patient very eager to have treatment and wanted to return today to recheck labs to see if she was able to have treatment this week. Unfortunately her white blood cell count and absolute neutrophil count has trended down. ANC is 0.6 today. Platelet count has improved to 129. Consulted Dr. Grayland Ormond and he would like to do re imaging, given she has been unable to receive treatment recently. Last CT scans were on 04/16/2017. Orders placed for CT of abdomen/pelvis/chest. Patient will see Dr. Grayland Ormond with labs, MD Assessment, review of imaging and possible carbo on Monday. Patient in agreement. 2. Pain: Continue fentanyl patch and oxycodone as needed.  Previously discussed with patient about referring back to PCP for pain management at conclusion of therapy.   3. Anxiety: Continue Xanax as prescribed. 4. Peripheral neuropathy: Improving. Discontinue Taxol as above.  Continue gabapentin as prescribed. 5. Thrombocytopenia: Better. Platelet count 129 today. 6.  Anemia: Improved. Hemoglobin 8.5 today.  7. Neurtopenia: ANC 0.6 today. Will hold treatment today. Maintain neutropenic precautions.   Patient expressed understanding and was in agreement with this plan. She also understands that She can call clinic at any time with any questions, concerns, or complaints.    Cancer Staging Ovarian cancer, unspecified laterality ([place]) Staging form: Ovary, Fallopian Tube, and Primary Peritoneal Carcinoma, AJCC 8th Edition - Clinical: No stage assigned - Unsigned   Jacquelin Hawking, NP   06/19/2017 12:41 PM

## 2017-06-19 NOTE — Progress Notes (Signed)
Patient here today for follow up with labs and treatment with Carboplatin. She states that she is feeling well today, except for some low back pain that she attributes to working in the yard yesterday.

## 2017-06-20 LAB — CA 125: Cancer Antigen (CA) 125: 38.9 U/mL — ABNORMAL HIGH (ref 0.0–38.1)

## 2017-06-22 ENCOUNTER — Other Ambulatory Visit: Payer: Self-pay | Admitting: Oncology

## 2017-06-22 NOTE — Progress Notes (Signed)
Jessica Burgess  [phone] [phone]  ID: [name] OB: Dec 29, 1955  MR#: [id number]  XIP#:[id number]  Patient Care Team: Steele Sizer, MD as PCP - General (Family Medicine) Lloyd Huger, MD as Consulting Physician (Oncology) Mellody Drown, MD as Consulting Physician (Obstetrics and Gynecology)  CHIEF COMPLAINT: Stage IIIc high-grade serous ovarian carcinoma  INTERVAL HISTORY: Patient returns to clinic today for further evaluation, discussion of her imaging results, and discussion to determine whether she should continue treatment or not.  She currently feels well and is at her baseline. She continues to have peripheral neuropathy, but this is mildly improved. She has no other neurologic complaints.  She denies any chest pain, shortness of breath, or cough.  She has a good appetite and denies any nausea, vomiting, constipation, or diarrhea.  She has no urinary complaints.  Patient offers no further specific complaints today.   REVIEW OF SYSTEMS:   Review of Systems  Constitutional: Positive for malaise/fatigue. Negative for fever and weight loss.  Respiratory: Positive for shortness of breath. Negative for cough.   Cardiovascular: Negative.  Negative for chest pain and leg swelling.  Gastrointestinal: Negative.  Negative for abdominal pain, blood in stool, constipation, diarrhea, melena, nausea and vomiting.  Genitourinary: Negative.   Musculoskeletal: Negative.  Negative for back pain, joint pain and neck pain.  Skin: Negative.  Negative for rash.  Neurological: Positive for tingling, sensory change and weakness. Negative for focal weakness.  Psychiatric/Behavioral: The patient is nervous/anxious.     As per HPI. Otherwise, a complete review of systems is negative.  PAST MEDICAL HISTORY: Past Medical History:  Diagnosis Date  . Allergic rhinitis, cause unspecified   . Anxiety state, unspecified   . Arthritis   . Asthma    only when sick   . Backache, unspecified   . Bronchitis    hx of when get sick  . Cancer ([place])    skin cancer , basal cell   . Cancer ([place]) 11/2016   ovarian  . Cervicalgia   . Complication of anesthesia   . Dermatophytosis of nail   . Dysmetabolic syndrome X   . Encounter for long-term (current) use of other medications   . Esophageal reflux   . Insomnia, unspecified   . Leukocytosis, unspecified   . Migraine without aura, without mention of intractable migraine without mention of status migrainosus   . Other and unspecified hyperlipidemia   . Other malaise and fatigue   . Overweight(278.02)   . Personal history of chemotherapy now   ovarian  . PONV (postoperative nausea and vomiting)   . Spinal stenosis in cervical region   . Symptomatic menopausal or female climacteric states   . Unspecified disorder of skin and subcutaneous tissue   . Unspecified vitamin D deficiency     PAST SURGICAL HISTORY: Past Surgical History:  Procedure Laterality Date  . ABDOMINAL HYSTERECTOMY    . ANTERIOR CERVICAL DECOMP/DISCECTOMY FUSION N/A 06/07/2015   Procedure: Cervical three - four and Cervical six- seven anterior cervical decompression with fusion interbody prosthesis plating and bonegraft;  Surgeon: Newman Pies, MD;  Location: [facility];  Service: Neurosurgery;  Laterality: N/A;  C34 and C67 anterior cervical decompression with fusion interbody prosthesis plating and bonegraft  . BACK SURGERY     x2 Lower   . EVACUATION OF CERVICAL HEMATOMA N/A 06/14/2015   Procedure: EVACUATION OF CERVICAL HEMATOMA;  Surgeon: Newman Pies, MD;  Location: [facility];  Service: Neurosurgery;  Laterality: N/A;  .  NECK SURGERY     x3  . TONSILLECTOMY      FAMILY HISTORY: Family History  Problem Relation Age of Onset  . Depression Mother   . Migraines Mother   . Dementia Father   . Diabetes Father   . Hyperlipidemia Father   . Hyperlipidemia Brother   . Hyperlipidemia Brother   .  Breast cancer Paternal Aunt        15s    ADVANCED DIRECTIVES (Y/N):  N  HEALTH MAINTENANCE: Social History   Tobacco Use  . Smoking status: Former Smoker    Packs/day: 1.50    Years: 20.00    Pack years: 30.00    Types: Cigarettes    Start date: 03/19/1979    Last attempt to quit: 08/25/1999    Years since quitting: 17.8  . Smokeless tobacco: Never Used  . Tobacco comment: smoking cessation materials not required  Substance Use Topics  . Alcohol use: Not Currently    Alcohol/week: 0.0 oz  . Drug use: No     Colonoscopy:  PAP:  Bone density:  Lipid panel:  No Known Allergies  Current Outpatient Medications  Medication Sig Dispense Refill  . albuterol (PROVENTIL HFA;VENTOLIN HFA) 108 (90 Base) MCG/ACT inhaler Inhale 2 puffs into the lungs every 6 (six) hours as needed for wheezing or shortness of breath. 1 Inhaler 2  . albuterol (PROVENTIL) (2.5 MG/3ML) 0.083% nebulizer solution Take 3 mLs (2.5 mg total) by nebulization every 6 (six) hours as needed for wheezing or shortness of breath. 75 mL 2  . ALPRAZolam (XANAX) 0.25 MG tablet Take 1 tablet (0.25 mg total) by mouth 2 (two) times daily as needed for anxiety. 60 tablet 0  . Ascorbic Acid (VITAMIN C PO) Take by mouth.    . budesonide-formoterol (SYMBICORT) 160-4.5 MCG/ACT inhaler Inhale 2 puffs into the lungs 2 (two) times daily.    . Cholecalciferol (VITAMIN D-1000 MAX ST) 1000 UNITS tablet Take 1 tablet by mouth 2 (two) times daily.    . Cyanocobalamin (B-12 PO) Take 1 tablet by mouth daily.     . cyclobenzaprine (FLEXERIL) 10 MG tablet TAKE 1 TABLET BY MOUTH THREE TIMES A DAY IF NEEDED FOR MUSCLE SPASM 30 tablet 0  . DULoxetine (CYMBALTA) 60 MG capsule TAKE 1 CAPSULE BY MOUTH ONCE DAILY 90 capsule 0  . estradiol (ESTRACE) 0.5 MG tablet take 1 tablet by mouth once daily 90 tablet 4  . ferrous sulfate (IRON SUPPLEMENT) 325 (65 FE) MG tablet Take 1 tablet by mouth daily.    . fluticasone (FLONASE) 50 MCG/ACT nasal spray Place  2 sprays into both nostrils as needed. 16 g 5  . gabapentin (NEURONTIN) 300 MG capsule Take 1-3 capsules (300-900 mg total) by mouth 3 (three) times daily. 300 mg twice a day and 900 mg at night 540 capsule 1  . ipratropium (ATROVENT HFA) 17 MCG/ACT inhaler Inhale 2 puffs into the lungs every 6 (six) hours.    . Magnesium Oxide 500 MG CAPS Take 500 mg by mouth 2 (two) times daily.     . Multiple Vitamins-Minerals (MULTIVITAMIN PO) Take 1 tablet by mouth daily.     . Omega-3 Fatty Acids (FISH OIL PO) Take by mouth.    . ondansetron (ZOFRAN) 8 MG tablet Take 1 tablet (8 mg total) by mouth 2 (two) times daily as needed for refractory nausea / vomiting. 60 tablet 2  . oxyCODONE-acetaminophen (PERCOCET) 10-325 MG tablet Take 1 tablet by mouth every 8 (eight) hours as  needed for pain. 90 tablet 0  . potassium gluconate 595 MG TABS tablet Take 595 mg by mouth daily.    . prochlorperazine (COMPAZINE) 10 MG tablet Take 1 tablet (10 mg total) by mouth every 6 (six) hours as needed (Nausea or vomiting). 60 tablet 2  . SUMAtriptan (IMITREX) 100 MG tablet take 1 tablet by mouth if needed for migraines as directed 27 tablet 1  . zolpidem (AMBIEN CR) 6.25 MG CR tablet Take 1 tablet (6.25 mg total) by mouth at bedtime. 90 tablet 0  . fentaNYL (DURAGESIC - DOSED MCG/HR) 25 MCG/HR patch Place 1 patch (25 mcg total) onto the skin every 3 (three) days. 5 patch 0   No current facility-administered medications for this visit.    Facility-Administered Medications Ordered in Other Visits  Medication Dose Route Frequency Provider Last Rate Last Dose  . 0.9 %  sodium chloride infusion   Intravenous Once Lloyd Huger, MD      . 0.9 %  sodium chloride infusion   Intravenous Once Lloyd Huger, MD      . dexamethasone (DECADRON) 20 mg in sodium chloride 0.9 % 50 mL IVPB  20 mg Intravenous Once Lloyd Huger, MD      . dexamethasone (DECADRON) injection 10 mg  10 mg Intravenous Once Lloyd Huger, MD       . sodium chloride flush (NS) 0.9 % injection 10 mL  10 mL Intravenous PRN Lloyd Huger, MD   10 mL at 06/16/17 0854  . sodium chloride flush (NS) 0.9 % injection 10 mL  10 mL Intravenous PRN Lloyd Huger, MD   10 mL at 06/23/17 1022    OBJECTIVE: Vitals:   06/23/17 0925 06/23/17 0929  BP:  116/68  Pulse:  79  Resp: 16   Temp:  97.7 F (36.5 C)     Body mass index is 28.57 kg/m.    ECOG FS:0 - Asymptomatic  General: Well-developed, well-nourished, no acute distress. Eyes: Pink conjunctiva, anicteric sclera. HEENT: Normocephalic, moist mucous membranes, clear oropharnyx. Lungs: Rare scattered wheezing, otherwise clear bilaterally. Heart: Regular rate and rhythm. No rubs, murmurs, or gallops. Abdomen: Surgical wound completely healed by secondary intention. Musculoskeletal: No edema, cyanosis, or clubbing. Neuro: Alert, answering all questions appropriately. Cranial nerves grossly intact. Skin: No rashes or petechiae noted. Psych: Normal affect.   LAB RESULTS:  Lab Results  Component Value Date   NA 138 06/23/2017   K 3.6 06/23/2017   CL 100 (L) 06/23/2017   CO2 28 06/23/2017   GLUCOSE 130 (H) 06/23/2017   BUN 11 06/23/2017   CREATININE 0.87 06/23/2017   CALCIUM 8.7 (L) 06/23/2017   PROT 6.4 (L) 06/23/2017   ALBUMIN 3.6 06/23/2017   AST 28 06/23/2017   ALT 21 06/23/2017   ALKPHOS 78 06/23/2017   BILITOT 0.4 06/23/2017   GFRNONAA >60 06/23/2017   GFRAA >60 06/23/2017    Lab Results  Component Value Date   WBC 2.2 (L) 06/23/2017   NEUTROABS 0.6 (L) 06/23/2017   HGB 8.7 (L) 06/23/2017   HCT 23.8 (L) 06/23/2017   MCV 103.9 (H) 06/23/2017   PLT 198 06/23/2017     STUDIES: Ct Chest W Contrast  Result Date: 06/19/2017 CLINICAL DATA:  Restaging of ovarian cancer. Shortness of breath and cough. Chemotherapy. EXAM: CT CHEST, ABDOMEN, AND PELVIS WITH CONTRAST TECHNIQUE: Multidetector CT imaging of the chest, abdomen and pelvis was performed  following the standard protocol during bolus administration of intravenous contrast.  CONTRAST:  150mL ISOVUE-300 IOPAMIDOL (ISOVUE-300) INJECTION 61% COMPARISON:  Multiple exams, including 04/16/2017 FINDINGS: CT CHEST FINDINGS Cardiovascular: Atherosclerotic calcification of the aortic arch and branch vasculature. Right Port-A-Cath tip: SVC. Mediastinum/Nodes: No pathologic adenopathy observed. Lungs/Pleura: 3 by 4 mm right middle lobe subpleural nodule on image 85/4 appears stable. Mild subsegmental atelectasis along both hemidiaphragms. Calcified granuloma in the lingula. Two small nodular densities anteriorly in the right lower lobe appear new, 1 measuring 0.8 by 0.3 cm on image 103/4 and an adjacent nodular density measuring 0.8 by 0.5 cm on image 99. Both of these are along a band of atelectasis. Although these could be from airway plugging or a similar benign etiology, metastatic lesions are not readily excluded. Musculoskeletal: Lower cervical plate and screw fixator. Thoracic spondylosis and degenerative disc disease. No compelling findings of osseous metastatic disease in the thoracic spine. CT ABDOMEN PELVIS FINDINGS Hepatobiliary: Extrahepatic biliary dilatation with common bile duct up to 1.3 cm, essentially stable. Cholecystectomy noted. The liver appears otherwise unremarkable. Pancreas: Unremarkable Spleen: Unremarkable Adrenals/Urinary Tract: Adrenal glands normal. The kidneys and urinary bladder appear normal. Stomach/Bowel: Prominent stool throughout the colon favors constipation. Vascular/Lymphatic: Aortoiliac atherosclerotic vascular disease. Small retroperitoneal lymph nodes are not pathologically enlarged. Reproductive: Hysterectomy. Stable indistinct density along the top of the vaginal cuff. Other: Postoperative findings from prior omentectomy. Minimal residual nodularity along the left upper quadrant omentum, with small nodules in this vicinity measuring up to 6 mm in long axis. This  nodularity was previously similar. Musculoskeletal: Postoperative findings in the lower lumbar spine. Lumbar spondylosis and degenerative disc disease. IMPRESSION: 1. Essentially stable minimal residual nodularity in the left upper quadrant omentum, meriting surveillance. 2. There are 2 new small nodular densities anteriorly in the right lower lobe, the larger measuring 0.8 by 0.5 cm. These are along a band of atelectasis, and could be due to airway plugging or a similar benign lesion, but could also be metastatic and merit surveillance. 3. Stable indistinct density along the vaginal cuff. 4. Other imaging findings of potential clinical significance: Aortic Atherosclerosis (ICD10-I70.0). Prominent stool throughout the colon favors constipation. Electronically Signed   By: Van Clines M.D.   On: 06/19/2017 12:19   Ct Abdomen Pelvis W Contrast  Result Date: 06/19/2017 CLINICAL DATA:  Restaging of ovarian cancer. Shortness of breath and cough. Chemotherapy. EXAM: CT CHEST, ABDOMEN, AND PELVIS WITH CONTRAST TECHNIQUE: Multidetector CT imaging of the chest, abdomen and pelvis was performed following the standard protocol during bolus administration of intravenous contrast. CONTRAST:  165mL ISOVUE-300 IOPAMIDOL (ISOVUE-300) INJECTION 61% COMPARISON:  Multiple exams, including 04/16/2017 FINDINGS: CT CHEST FINDINGS Cardiovascular: Atherosclerotic calcification of the aortic arch and branch vasculature. Right Port-A-Cath tip: SVC. Mediastinum/Nodes: No pathologic adenopathy observed. Lungs/Pleura: 3 by 4 mm right middle lobe subpleural nodule on image 85/4 appears stable. Mild subsegmental atelectasis along both hemidiaphragms. Calcified granuloma in the lingula. Two small nodular densities anteriorly in the right lower lobe appear new, 1 measuring 0.8 by 0.3 cm on image 103/4 and an adjacent nodular density measuring 0.8 by 0.5 cm on image 99. Both of these are along a band of atelectasis. Although these could be  from airway plugging or a similar benign etiology, metastatic lesions are not readily excluded. Musculoskeletal: Lower cervical plate and screw fixator. Thoracic spondylosis and degenerative disc disease. No compelling findings of osseous metastatic disease in the thoracic spine. CT ABDOMEN PELVIS FINDINGS Hepatobiliary: Extrahepatic biliary dilatation with common bile duct up to 1.3 cm, essentially stable. Cholecystectomy noted. The liver appears  otherwise unremarkable. Pancreas: Unremarkable Spleen: Unremarkable Adrenals/Urinary Tract: Adrenal glands normal. The kidneys and urinary bladder appear normal. Stomach/Bowel: Prominent stool throughout the colon favors constipation. Vascular/Lymphatic: Aortoiliac atherosclerotic vascular disease. Small retroperitoneal lymph nodes are not pathologically enlarged. Reproductive: Hysterectomy. Stable indistinct density along the top of the vaginal cuff. Other: Postoperative findings from prior omentectomy. Minimal residual nodularity along the left upper quadrant omentum, with small nodules in this vicinity measuring up to 6 mm in long axis. This nodularity was previously similar. Musculoskeletal: Postoperative findings in the lower lumbar spine. Lumbar spondylosis and degenerative disc disease. IMPRESSION: 1. Essentially stable minimal residual nodularity in the left upper quadrant omentum, meriting surveillance. 2. There are 2 new small nodular densities anteriorly in the right lower lobe, the larger measuring 0.8 by 0.5 cm. These are along a band of atelectasis, and could be due to airway plugging or a similar benign lesion, but could also be metastatic and merit surveillance. 3. Stable indistinct density along the vaginal cuff. 4. Other imaging findings of potential clinical significance: Aortic Atherosclerosis (ICD10-I70.0). Prominent stool throughout the colon favors constipation. Electronically Signed   By: Van Clines M.D.   On: 06/19/2017 12:19   Mr  Shoulder Right Wo Contrast  Result Date: 06/11/2017 CLINICAL DATA:  Status post fall. Generalized shoulder pain. Getting worse. EXAM: MRI OF THE RIGHT SHOULDER WITHOUT CONTRAST TECHNIQUE: Multiplanar, multisequence MR imaging of the shoulder was performed. No intravenous contrast was administered. COMPARISON:  None. FINDINGS: Patient motion degrading image quality limiting evaluation. Rotator cuff: Complete tear of the supraspinatus tendon with 3.3 cm of retraction. Moderate tendinosis of the infraspinatus tendon without a discrete tear. Teres minor tendon is intact. Subscapularis tendon is intact. Muscles: Mild muscle atrophy of the supraspinatus muscle. Remainder the rotator cuff muscles demonstrate no significant atrophy. Remainder of the muscles surrounding the right shoulder are normal. Biceps long head: Severe tendinosis of the intra-articular portion of the long head of the biceps tendon. Acromioclavicular Joint: Mild arthropathy of the acromioclavicular joint. Type I acromion. Small amount of subacromial/subdeltoid bursal fluid. Glenohumeral Joint: No joint effusion. Generalized chondral thinning. Labrum:  No discrete labral tear. Bones:  No acute osseous abnormality.  No aggressive osseous lesion. Other: No fluid collection or hematoma. IMPRESSION: Patient motion degrades image quality limiting evaluation. 1. Complete tear of the supraspinatus tendon with 3.3 cm of retraction. Mild muscle atrophy of the supraspinatus muscle. 2. Moderate tendinosis of the infraspinatus tendon without a discrete tear. 3. Severe tendinosis of the intra-articular portion of the long head of the biceps tendon. Electronically Signed   By: Kathreen Devoid   On: 06/11/2017 09:20    ASSESSMENT: Stage IIIc high-grade serous ovarian carcinoma  PLAN:  1. Stage IIIc high-grade serous ovarian carcinoma:  She underwent debulking surgery at [facility] on November 19, 2016. She was also initiated on a clinical trial and has  received preoperative infusion of Keytruda on November 11, 2016.  Because of patient's persistent peripheral neuropathy as well as pancytopenia, Taxol was discussed continued altogether.  CT scan from June 19, 2017 is essentially unchanged from previous scan in January.  Ca 125 is mildly elevated.  Because of patient's persistent neutropenia, will hold chemotherapy altogether at this point.  Return to clinic in 2 weeks for laboratory work only and then in 4 weeks for laboratory work, further evaluation, and discussed the need of additional treatment if possible.   2. Pain: Continue fentanyl patch and oxycodone as needed.  Will dose reduce fentanyl patch from 50  mcg to 25 mcg every 3 days.  Previously discussed with patient about referring back to PCP for pain management at conclusion of therapy.   3. Diarrhea: Resolved. Continue Imodium as needed. 4. Anxiety: Continue Xanax as prescribed. 5.  Peripheral neuropathy: Discontinue Taxol as above.  Continue gabapentin as prescribed. 6.  Thrombocytopenia: Resolved.   7.  Anemia: Improved, patient does not require transfusion today. 8.  Neutropenia: Discontinue treatment as above.  Monitor.   Patient expressed understanding and was in agreement with this plan. She also understands that She can call clinic at any time with any questions, concerns, or complaints.   Cancer Staging Ovarian cancer, unspecified laterality ([place]) Staging form: Ovary, Fallopian Tube, and Primary Peritoneal Carcinoma, AJCC 8th Edition - Clinical: No stage assigned - Unsigned   Lloyd Huger, MD   06/23/2017 12:53 PM

## 2017-06-23 ENCOUNTER — Other Ambulatory Visit: Payer: Self-pay

## 2017-06-23 ENCOUNTER — Other Ambulatory Visit: Payer: Self-pay | Admitting: *Deleted

## 2017-06-23 ENCOUNTER — Inpatient Hospital Stay: Payer: PPO

## 2017-06-23 ENCOUNTER — Inpatient Hospital Stay (HOSPITAL_BASED_OUTPATIENT_CLINIC_OR_DEPARTMENT_OTHER): Payer: PPO | Admitting: Oncology

## 2017-06-23 ENCOUNTER — Encounter: Payer: Self-pay | Admitting: Oncology

## 2017-06-23 VITALS — BP 116/68 | HR 79 | Temp 97.7°F | Resp 16 | Ht 63.0 in | Wt 161.3 lb

## 2017-06-23 DIAGNOSIS — C7960 Secondary malignant neoplasm of unspecified ovary: Secondary | ICD-10-CM

## 2017-06-23 DIAGNOSIS — Z87891 Personal history of nicotine dependence: Secondary | ICD-10-CM

## 2017-06-23 DIAGNOSIS — C569 Malignant neoplasm of unspecified ovary: Secondary | ICD-10-CM | POA: Diagnosis not present

## 2017-06-23 LAB — CBC WITH DIFFERENTIAL/PLATELET
BASOS PCT: 1 %
Basophils Absolute: 0 10*3/uL (ref 0–0.1)
EOS ABS: 0 10*3/uL (ref 0–0.7)
EOS PCT: 2 %
HCT: 23.8 % — ABNORMAL LOW (ref 35.0–47.0)
Hemoglobin: 8.7 g/dL — ABNORMAL LOW (ref 12.0–16.0)
LYMPHS ABS: 1.2 10*3/uL (ref 1.0–3.6)
Lymphocytes Relative: 52 %
MCH: 37.9 pg — AB (ref 26.0–34.0)
MCHC: 36.5 g/dL — AB (ref 32.0–36.0)
MCV: 103.9 fL — ABNORMAL HIGH (ref 80.0–100.0)
MONO ABS: 0.4 10*3/uL (ref 0.2–0.9)
MONOS PCT: 17 %
Neutro Abs: 0.6 10*3/uL — ABNORMAL LOW (ref 1.4–6.5)
Neutrophils Relative %: 28 %
PLATELETS: 198 10*3/uL (ref 150–440)
RBC: 2.3 MIL/uL — ABNORMAL LOW (ref 3.80–5.20)
RDW: 18.8 % — AB (ref 11.5–14.5)
WBC: 2.2 10*3/uL — ABNORMAL LOW (ref 3.6–11.0)

## 2017-06-23 LAB — COMPREHENSIVE METABOLIC PANEL
ALK PHOS: 78 U/L (ref 38–126)
ALT: 21 U/L (ref 14–54)
AST: 28 U/L (ref 15–41)
Albumin: 3.6 g/dL (ref 3.5–5.0)
Anion gap: 10 (ref 5–15)
BUN: 11 mg/dL (ref 6–20)
CALCIUM: 8.7 mg/dL — AB (ref 8.9–10.3)
CHLORIDE: 100 mmol/L — AB (ref 101–111)
CO2: 28 mmol/L (ref 22–32)
CREATININE: 0.87 mg/dL (ref 0.44–1.00)
GFR calc Af Amer: >60 mL/min (ref >60)
GFR calc non Af Amer: >60 mL/min (ref >60)
GLUCOSE: 130 mg/dL — AB (ref 65–99)
Potassium: 3.6 mmol/L (ref 3.5–5.1)
SODIUM: 138 mmol/L (ref 135–145)
Total Bilirubin: 0.4 mg/dL (ref 0.3–1.2)
Total Protein: 6.4 g/dL — ABNORMAL LOW (ref 6.5–8.1)

## 2017-06-23 MED ORDER — HEPARIN SOD (PORK) LOCK FLUSH 100 UNIT/ML IV SOLN
INTRAVENOUS | Status: AC
  Filled 2017-06-23: qty 5

## 2017-06-23 MED ORDER — FENTANYL 25 MCG/HR TD PT72: 25.0000 ug | MEDICATED_PATCH | TRANSDERMAL | 0 refills | Status: DC

## 2017-06-23 MED ORDER — SODIUM CHLORIDE 0.9% FLUSH
10.0000 mL | INTRAVENOUS | Status: DC | PRN
  Administered 2017-06-23: 10 mL via INTRAVENOUS
  Filled 2017-06-23: qty 10

## 2017-06-23 MED ORDER — HEPARIN SOD (PORK) LOCK FLUSH 100 UNIT/ML IV SOLN
500.0000 [IU] | Freq: Once | INTRAVENOUS | Status: AC
  Administered 2017-06-23: 500 [IU] via INTRAVENOUS

## 2017-06-23 NOTE — Progress Notes (Signed)
Patient here for pre treatment check. She is feeling very lightheaded and nauseous this for the past few weeks.

## 2017-06-24 LAB — CA 125: Cancer Antigen (CA) 125: 49.7 U/mL — ABNORMAL HIGH (ref 0.0–38.1)

## 2017-06-26 ENCOUNTER — Other Ambulatory Visit: Payer: Self-pay | Admitting: Oncology

## 2017-07-02 ENCOUNTER — Ambulatory Visit: Payer: PPO

## 2017-07-08 ENCOUNTER — Other Ambulatory Visit: Payer: PPO

## 2017-07-08 ENCOUNTER — Other Ambulatory Visit: Payer: Self-pay | Admitting: *Deleted

## 2017-07-08 ENCOUNTER — Telehealth: Payer: Self-pay | Admitting: *Deleted

## 2017-07-08 DIAGNOSIS — C569 Malignant neoplasm of unspecified ovary: Secondary | ICD-10-CM

## 2017-07-08 DIAGNOSIS — D6481 Anemia due to antineoplastic chemotherapy: Secondary | ICD-10-CM

## 2017-07-08 DIAGNOSIS — T451X5A Adverse effect of antineoplastic and immunosuppressive drugs, initial encounter: Secondary | ICD-10-CM

## 2017-07-08 DIAGNOSIS — R5383 Other fatigue: Secondary | ICD-10-CM

## 2017-07-08 NOTE — Telephone Encounter (Signed)
Patient called in to see if she could have labs and be seen tomorrow for increasing fatigue. Her last chemo was about 3 weeks ago. She is out of town right now, but will be back in town this evening. Gave her an appt. To get labs and see Sonia Baller, NP tomorrow AM.     Currie Paris

## 2017-07-09 ENCOUNTER — Inpatient Hospital Stay (HOSPITAL_BASED_OUTPATIENT_CLINIC_OR_DEPARTMENT_OTHER): Payer: PPO | Admitting: Oncology

## 2017-07-09 ENCOUNTER — Inpatient Hospital Stay: Payer: PPO

## 2017-07-09 VITALS — BP 128/76 | HR 77 | Temp 96.5°F | Resp 18 | Wt 157.6 lb

## 2017-07-09 DIAGNOSIS — R5383 Other fatigue: Secondary | ICD-10-CM

## 2017-07-09 DIAGNOSIS — G8929 Other chronic pain: Secondary | ICD-10-CM

## 2017-07-09 DIAGNOSIS — M5441 Lumbago with sciatica, right side: Secondary | ICD-10-CM

## 2017-07-09 DIAGNOSIS — M542 Cervicalgia: Secondary | ICD-10-CM

## 2017-07-09 DIAGNOSIS — Z87891 Personal history of nicotine dependence: Secondary | ICD-10-CM | POA: Diagnosis not present

## 2017-07-09 DIAGNOSIS — C569 Malignant neoplasm of unspecified ovary: Secondary | ICD-10-CM

## 2017-07-09 DIAGNOSIS — T451X5A Adverse effect of antineoplastic and immunosuppressive drugs, initial encounter: Principal | ICD-10-CM

## 2017-07-09 DIAGNOSIS — D6481 Anemia due to antineoplastic chemotherapy: Secondary | ICD-10-CM

## 2017-07-09 DIAGNOSIS — E860 Dehydration: Secondary | ICD-10-CM

## 2017-07-09 DIAGNOSIS — Z95828 Presence of other vascular implants and grafts: Secondary | ICD-10-CM

## 2017-07-09 DIAGNOSIS — E876 Hypokalemia: Secondary | ICD-10-CM

## 2017-07-09 LAB — CBC WITH DIFFERENTIAL/PLATELET
BASOS PCT: 1 %
Basophils Absolute: 0 10*3/uL (ref 0–0.1)
Eosinophils Absolute: 0 10*3/uL (ref 0–0.7)
Eosinophils Relative: 1 %
HCT: 30.4 % — ABNORMAL LOW (ref 35.0–47.0)
Hemoglobin: 10.7 g/dL — ABNORMAL LOW (ref 12.0–16.0)
Lymphocytes Relative: 34 %
Lymphs Abs: 1.3 10*3/uL (ref 1.0–3.6)
MCH: 36.8 pg — ABNORMAL HIGH (ref 26.0–34.0)
MCHC: 35.3 g/dL (ref 32.0–36.0)
MCV: 104.3 fL — ABNORMAL HIGH (ref 80.0–100.0)
MONO ABS: 0.4 10*3/uL (ref 0.2–0.9)
MONOS PCT: 11 %
NEUTROS PCT: 53 %
Neutro Abs: 2 10*3/uL (ref 1.4–6.5)
Platelets: 262 10*3/uL (ref 150–440)
RBC: 2.91 MIL/uL — ABNORMAL LOW (ref 3.80–5.20)
RDW: 16.2 % — AB (ref 11.5–14.5)
WBC: 3.8 10*3/uL (ref 3.6–11.0)

## 2017-07-09 LAB — COMPREHENSIVE METABOLIC PANEL
ALK PHOS: 69 U/L (ref 38–126)
ALT: 20 U/L (ref 14–54)
AST: 31 U/L (ref 15–41)
Albumin: 3.8 g/dL (ref 3.5–5.0)
Anion gap: 9 (ref 5–15)
BUN: 13 mg/dL (ref 6–20)
CALCIUM: 9 mg/dL (ref 8.9–10.3)
CO2: 26 mmol/L (ref 22–32)
Chloride: 102 mmol/L (ref 101–111)
Creatinine, Ser: 0.82 mg/dL (ref 0.44–1.00)
GFR calc non Af Amer: >60 mL/min (ref >60)
GLUCOSE: 128 mg/dL — AB (ref 65–99)
Potassium: 3.3 mmol/L — ABNORMAL LOW (ref 3.5–5.1)
SODIUM: 137 mmol/L (ref 135–145)
Total Bilirubin: 0.6 mg/dL (ref 0.3–1.2)
Total Protein: 6.8 g/dL (ref 6.5–8.1)

## 2017-07-09 LAB — INFLUENZA PANEL BY PCR (TYPE A & B)
Influenza A By PCR: NEGATIVE
Influenza B By PCR: NEGATIVE

## 2017-07-09 LAB — URINALYSIS, COMPLETE (UACMP) WITH MICROSCOPIC
Bilirubin Urine: NEGATIVE
Glucose, UA: NEGATIVE mg/dL
HGB URINE DIPSTICK: NEGATIVE
Ketones, ur: 5 mg/dL — AB
NITRITE: NEGATIVE
Protein, ur: NEGATIVE mg/dL
Specific Gravity, Urine: 1.024 (ref 1.005–1.030)
pH: 5 (ref 5.0–8.0)

## 2017-07-09 LAB — SAMPLE TO BLOOD BANK

## 2017-07-09 MED ORDER — SODIUM CHLORIDE 0.9 % IV SOLN
Freq: Once | INTRAVENOUS | Status: AC
  Administered 2017-07-09: 11:00:00 via INTRAVENOUS
  Filled 2017-07-09: qty 1000

## 2017-07-09 MED ORDER — SODIUM CHLORIDE 0.9 % IV SOLN: 10.0000 mg | Freq: Once | INTRAVENOUS | Status: DC

## 2017-07-09 MED ORDER — OXYCODONE-ACETAMINOPHEN 10-325 MG PO TABS: 1.0000 | ORAL_TABLET | Freq: Three times a day (TID) | ORAL | 0 refills | Status: DC | PRN

## 2017-07-09 MED ORDER — PREDNISONE 5 MG PO TABS: 5.0000 mg | ORAL_TABLET | Freq: Every day | ORAL | 0 refills | Status: DC

## 2017-07-09 MED ORDER — DEXAMETHASONE SODIUM PHOSPHATE 10 MG/ML IJ SOLN
10.0000 mg | Freq: Once | INTRAMUSCULAR | Status: AC
  Administered 2017-07-09: 10 mg via INTRAVENOUS
  Filled 2017-07-09: qty 1

## 2017-07-09 NOTE — Progress Notes (Signed)
Patient here today for acute add on for fatigue and neuropathy. She reports feeling very tired and having severe neuropathy in her hands, which has gotten worse.  The neuropathy in her feet is a little better. She states that she fell 3 times last week. Once while riding her bike, once while walking, and once while working in the yard. She is complaining of left shoulder pain due to the falls and is also complaining of lower back pain. She is requesting a refill of her oxycodone/acetaminopen today. She reports not completely remembering the falls, or what might have caused them. She also reports not remembering other things as well, and wonders if it is "chemo brain". She reports having weakness and balance issues. She states that she has been very lethargic and has not had a good appetite.

## 2017-07-09 NOTE — Progress Notes (Signed)
Symptom Management Consult note [facility]  Telephone:([phone] [phone]  Patient Care Team: Steele Sizer, MD as PCP - General (Family Medicine) Lloyd Huger, MD as Consulting Physician (Oncology) Mellody Drown, MD as Consulting Physician (Obstetrics and Gynecology)   Name of the patient: Jessica Burgess  [id number]  03/30/1955   Date of visit: 07/09/17  Diagnosis- Stage IIIc high-grade serous ovarian carcinoma  Chief complaint/ Reason for visit- "not feeling well"  Heme/Onc history: Patient was last seen by primary medical oncologist Dr. Grayland Ormond on 06/23/2017 to discuss imaging results and determine whether she should continue chemo treatment. She complained of mild peripheral neuropathy but denied any further complaints. Due to persistent peripheral neuropathy and pancytopenia treatment had been postponed indefinitely or until lab results improved.  Patient initially underwent debulking surgery at [facility] on 11/19/2016. She was initiated on a clinical trial and received preoperative infusion of Keytruda in August 2018. Began Carbo/Taxol in October 2018 every 3 weeks for a total of 7 cycles. Developed neuropathy and severe pancytopenia so Taxol was discontinued altogether in Feb 2019. She continued with [place] only for one cycle on 05/19/17. She unfortunately has not received any additional treatments due to persistent pancytopenia.  Her CA 125 began to trend up at the beginning of April 2019 and is currently 49.7. A repeat CT of abdomen/pelvis/chest was completed showing essentially stable disease.  Interval history-  Patient complains of fatigue.  Symptoms began a week ago.  Sentinal symptom the patient feels fatigue began with: none.  Symptoms of her fatigue have been anxiousness, change in appetite, feelings of depression and general malaise.  Patient describes the following psychologic symptoms: depression.   Patient  denies fever, unusual rashes, cold intolerance, constipation and change in hair texture. and GI blood loss.  Symptoms have gradually worsened.  Severity has been struggles to carry out day to day responsibilities..  Previous visits for this problem: none.    She also describes symptoms of numbness, tingling and cramping.  Onset of symptoms was sudden, starting about 4 months ago.  Symptoms are currently of severe severity.  Symptoms occur all day and last hours.  The patient denies squeezing and hypersensitivity.  Symptoms are worse on the right side.  She denies  orthostasis, bloating, nausea, diarrhea and constipation.  Previous treatment has included Gabapentin 300 mg (1 tab in the morning and 3 at bedtime), which has not improved symptoms.   ECOG FS:1 - Symptomatic but completely ambulatory  Review of systems- Review of Systems  Constitutional: Positive for malaise/fatigue. Negative for chills, fever and weight loss.  HENT: Negative for congestion and ear pain.   Eyes: Negative.  Negative for blurred vision and double vision.  Respiratory: Negative.  Negative for cough, sputum production and shortness of breath.   Cardiovascular: Negative.  Negative for chest pain, palpitations and leg swelling.  Gastrointestinal: Negative.  Negative for abdominal pain, constipation, diarrhea, nausea and vomiting.  Genitourinary: Negative for dysuria, frequency and urgency.  Musculoskeletal: Positive for falls (X 3 incidents) and joint pain (Bilateral shoulders). Negative for back pain.  Skin: Negative.  Negative for rash.  Neurological: Positive for tingling (Neuropathy worsening in bilateral upper extremities but worse in right hand), sensory change and weakness. Negative for headaches.  Endo/Heme/Allergies: Negative.  Does not bruise/bleed easily.  Psychiatric/Behavioral: Negative.  Negative for depression. The patient is not nervous/anxious and does not have insomnia.      Current treatment-  Last Norma Fredrickson only on 05/19/17  No Known Allergies   Past Medical History:  Diagnosis Date  . Allergic rhinitis, cause unspecified   . Anxiety state, unspecified   . Arthritis   . Asthma    only when sick   . Backache, unspecified   . Bronchitis    hx of when get sick  . Cancer ([place])    skin cancer , basal cell   . Cancer ([place]) 11/2016   ovarian  . Cervicalgia   . Complication of anesthesia   . Dermatophytosis of nail   . Dysmetabolic syndrome X   . Encounter for long-term (current) use of other medications   . Esophageal reflux   . Insomnia, unspecified   . Leukocytosis, unspecified   . Migraine without aura, without mention of intractable migraine without mention of status migrainosus   . Other and unspecified hyperlipidemia   . Other malaise and fatigue   . Overweight(278.02)   . Personal history of chemotherapy now   ovarian  . PONV (postoperative nausea and vomiting)   . Spinal stenosis in cervical region   . Symptomatic menopausal or female climacteric states   . Unspecified disorder of skin and subcutaneous tissue   . Unspecified vitamin D deficiency      Past Surgical History:  Procedure Laterality Date  . ABDOMINAL HYSTERECTOMY    . ANTERIOR CERVICAL DECOMP/DISCECTOMY FUSION N/A 06/07/2015   Procedure: Cervical three - four and Cervical six- seven anterior cervical decompression with fusion interbody prosthesis plating and bonegraft;  Surgeon: Newman Pies, MD;  Location: [place] [facility];  Service: Neurosurgery;  Laterality: N/A;  C34 and C67 anterior cervical decompression with fusion interbody prosthesis plating and bonegraft  . BACK SURGERY     x2 Lower   . EVACUATION OF CERVICAL HEMATOMA N/A 06/14/2015   Procedure: EVACUATION OF CERVICAL HEMATOMA;  Surgeon: Newman Pies, MD;  Location: [place] [facility];  Service: Neurosurgery;  Laterality: N/A;  . NECK SURGERY     x3  . TONSILLECTOMY      Social History   Socioeconomic History  . Marital status: Single      Spouse name: Not on file  . Number of children: 0  . Years of education: some college  . Highest education level: 12th grade  Occupational History    Employer: DISABLED  . Occupation: Disabled   Social Needs  . Financial resource strain: Very hard  . Food insecurity:    Worry: Never true    Inability: Never true  . Transportation needs:    Medical: No    Non-medical: No  Tobacco Use  . Smoking status: Former Smoker    Packs/day: 1.50    Years: 20.00    Pack years: 30.00    Types: Cigarettes    Start date: 03/19/1979    Last attempt to quit: 08/25/1999    Years since quitting: 17.8  . Smokeless tobacco: Never Used  . Tobacco comment: smoking cessation materials not required  Substance and Sexual Activity  . Alcohol use: Not Currently    Alcohol/week: 0.0 oz  . Drug use: No  . Sexual activity: Never  Lifestyle  . Physical activity:    Days per week: 0 days    Minutes per session: 0 min  . Stress: Very much  Relationships  . Social connections:    Talks on phone: Patient refused    Gets together: Patient refused    Attends religious service: Patient refused    Active member of club or organization: Patient refused  Attends meetings of clubs or organizations: Patient refused    Relationship status: Patient refused  . Intimate partner violence:    Fear of current or ex partner: No    Emotionally abused: No    Physically abused: No    Forced sexual activity: No  Other Topics Concern  . Not on file  Social History Narrative   Patient is single.    Patient lives with roommates.    Patient on disability    Patient has no children.    Patient has some college     Family History  Problem Relation Age of Onset  . Depression Mother   . Migraines Mother   . Dementia Father   . Diabetes Father   . Hyperlipidemia Father   . Hyperlipidemia Brother   . Hyperlipidemia Brother   . Breast cancer Paternal Aunt        49s     Current Outpatient Medications:  .   albuterol (PROVENTIL HFA;VENTOLIN HFA) 108 (90 Base) MCG/ACT inhaler, Inhale 2 puffs into the lungs every 6 (six) hours as needed for wheezing or shortness of breath., Disp: 1 Inhaler, Rfl: 2 .  albuterol (PROVENTIL) (2.5 MG/3ML) 0.083% nebulizer solution, Take 3 mLs (2.5 mg total) by nebulization every 6 (six) hours as needed for wheezing or shortness of breath., Disp: 75 mL, Rfl: 2 .  ALPRAZolam (XANAX) 0.25 MG tablet, Take 1 tablet (0.25 mg total) by mouth 2 (two) times daily as needed for anxiety., Disp: 60 tablet, Rfl: 0 .  Ascorbic Acid (VITAMIN C PO), Take by mouth., Disp: , Rfl:  .  budesonide-formoterol (SYMBICORT) 160-4.5 MCG/ACT inhaler, Inhale 2 puffs into the lungs 2 (two) times daily., Disp: , Rfl:  .  Cholecalciferol (VITAMIN D-1000 MAX ST) 1000 UNITS tablet, Take 1 tablet by mouth 2 (two) times daily., Disp: , Rfl:  .  Cyanocobalamin (B-12 PO), Take 1 tablet by mouth daily. , Disp: , Rfl:  .  cyclobenzaprine (FLEXERIL) 10 MG tablet, TAKE 1 TABLET BY MOUTH THREE TIMES A DAY IF NEEDED FOR MUSCLE SPASM, Disp: 30 tablet, Rfl: 0 .  DULoxetine (CYMBALTA) 60 MG capsule, TAKE 1 CAPSULE BY MOUTH ONCE DAILY, Disp: 90 capsule, Rfl: 0 .  estradiol (ESTRACE) 0.5 MG tablet, take 1 tablet by mouth once daily, Disp: 90 tablet, Rfl: 4 .  fentaNYL (DURAGESIC - DOSED MCG/HR) 25 MCG/HR patch, Place 1 patch (25 mcg total) onto the skin every 3 (three) days., Disp: 5 patch, Rfl: 0 .  ferrous sulfate (IRON SUPPLEMENT) 325 (65 FE) MG tablet, Take 1 tablet by mouth daily., Disp: , Rfl:  .  fluticasone (FLONASE) 50 MCG/ACT nasal spray, Place 2 sprays into both nostrils as needed., Disp: 16 g, Rfl: 5 .  gabapentin (NEURONTIN) 300 MG capsule, Take 1-3 capsules (300-900 mg total) by mouth 3 (three) times daily. 300 mg twice a day and 900 mg at night, Disp: 540 capsule, Rfl: 1 .  ipratropium (ATROVENT HFA) 17 MCG/ACT inhaler, Inhale 2 puffs into the lungs every 6 (six) hours., Disp: , Rfl:  .  Magnesium Oxide 500  MG CAPS, Take 500 mg by mouth 2 (two) times daily. , Disp: , Rfl:  .  Multiple Vitamins-Minerals (MULTIVITAMIN PO), Take 1 tablet by mouth daily. , Disp: , Rfl:  .  Omega-3 Fatty Acids (FISH OIL PO), Take by mouth., Disp: , Rfl:  .  ondansetron (ZOFRAN) 8 MG tablet, Take 1 tablet (8 mg total) by mouth 2 (two) times daily as needed for  refractory nausea / vomiting., Disp: 60 tablet, Rfl: 2 .  oxyCODONE-acetaminophen (PERCOCET) 10-325 MG tablet, Take 1 tablet by mouth every 8 (eight) hours as needed for pain., Disp: 90 tablet, Rfl: 0 .  potassium gluconate 595 MG TABS tablet, Take 595 mg by mouth daily., Disp: , Rfl:  .  prochlorperazine (COMPAZINE) 10 MG tablet, Take 1 tablet (10 mg total) by mouth every 6 (six) hours as needed (Nausea or vomiting)., Disp: 60 tablet, Rfl: 2 .  SUMAtriptan (IMITREX) 100 MG tablet, take 1 tablet by mouth if needed for migraines as directed, Disp: 27 tablet, Rfl: 1 .  zolpidem (AMBIEN CR) 6.25 MG CR tablet, Take 1 tablet (6.25 mg total) by mouth at bedtime., Disp: 90 tablet, Rfl: 0 No current facility-administered medications for this visit.   Facility-Administered Medications Ordered in Other Visits:  .  0.9 %  sodium chloride infusion, , Intravenous, Once, Finnegan, Kathlene November, MD .  0.9 %  sodium chloride infusion, , Intravenous, Once, Grayland Ormond, Kathlene November, MD .  dexamethasone (DECADRON) 20 mg in sodium chloride 0.9 % 50 mL IVPB, 20 mg, Intravenous, Once, Grayland Ormond, Kathlene November, MD .  dexamethasone (DECADRON) injection 10 mg, 10 mg, Intravenous, Once, Finnegan, Kathlene November, MD .  sodium chloride flush (NS) 0.9 % injection 10 mL, 10 mL, Intravenous, PRN, Lloyd Huger, MD, 10 mL at 06/16/17 0854  Physical exam: There were no vitals filed for this visit. Physical Exam  Constitutional: She is oriented to person, place, and time. Vital signs are normal. She appears well-developed.  HENT:  Head: Normocephalic and atraumatic.  Eyes: Pupils are equal, round, and  reactive to light.  Neck: Normal range of motion.  Cardiovascular: Normal rate, regular rhythm and normal heart sounds.  No murmur heard. Pulmonary/Chest: Effort normal and breath sounds normal. She has no wheezes.  Abdominal: Soft. Normal appearance and bowel sounds are normal. She exhibits no distension. There is no tenderness.  Musculoskeletal: Normal range of motion. She exhibits no edema.  Neurological: She is alert and oriented to person, place, and time.  Skin: Skin is warm and dry. No rash noted.  Psychiatric: Judgment normal.     CMP Latest Ref Rng & Units 06/23/2017  Glucose 65 - 99 mg/dL 130(H)  BUN 6 - 20 mg/dL 11  Creatinine 0.44 - 1.00 mg/dL 0.87  Sodium 135 - 145 mmol/L 138  Potassium 3.5 - 5.1 mmol/L 3.6  Chloride 101 - 111 mmol/L 100(L)  CO2 22 - 32 mmol/L 28  Calcium 8.9 - 10.3 mg/dL 8.7(L)  Total Protein 6.5 - 8.1 g/dL 6.4(L)  Total Bilirubin 0.3 - 1.2 mg/dL 0.4  Alkaline Phos 38 - 126 U/L 78  AST 15 - 41 U/L 28  ALT 14 - 54 U/L 21   CBC Latest Ref Rng & Units 06/23/2017  WBC 3.6 - 11.0 K/uL 2.2(L)  Hemoglobin 12.0 - 16.0 g/dL 8.7(L)  Hematocrit 35.0 - 47.0 % 23.8(L)  Platelets 150 - 440 K/uL 198    No images are attached to the encounter.  Ct Chest W Contrast  Result Date: 06/19/2017 CLINICAL DATA:  Restaging of ovarian cancer. Shortness of breath and cough. Chemotherapy. EXAM: CT CHEST, ABDOMEN, AND PELVIS WITH CONTRAST TECHNIQUE: Multidetector CT imaging of the chest, abdomen and pelvis was performed following the standard protocol during bolus administration of intravenous contrast. CONTRAST:  180mL ISOVUE-300 IOPAMIDOL (ISOVUE-300) INJECTION 61% COMPARISON:  Multiple exams, including 04/16/2017 FINDINGS: CT CHEST FINDINGS Cardiovascular: Atherosclerotic calcification of the aortic arch and branch vasculature.  Right Port-A-Cath tip: SVC. Mediastinum/Nodes: No pathologic adenopathy observed. Lungs/Pleura: 3 by 4 mm right middle lobe subpleural nodule on image  85/4 appears stable. Mild subsegmental atelectasis along both hemidiaphragms. Calcified granuloma in the lingula. Two small nodular densities anteriorly in the right lower lobe appear new, 1 measuring 0.8 by 0.3 cm on image 103/4 and an adjacent nodular density measuring 0.8 by 0.5 cm on image 99. Both of these are along a band of atelectasis. Although these could be from airway plugging or a similar benign etiology, metastatic lesions are not readily excluded. Musculoskeletal: Lower cervical plate and screw fixator. Thoracic spondylosis and degenerative disc disease. No compelling findings of osseous metastatic disease in the thoracic spine. CT ABDOMEN PELVIS FINDINGS Hepatobiliary: Extrahepatic biliary dilatation with common bile duct up to 1.3 cm, essentially stable. Cholecystectomy noted. The liver appears otherwise unremarkable. Pancreas: Unremarkable Spleen: Unremarkable Adrenals/Urinary Tract: Adrenal glands normal. The kidneys and urinary bladder appear normal. Stomach/Bowel: Prominent stool throughout the colon favors constipation. Vascular/Lymphatic: Aortoiliac atherosclerotic vascular disease. Small retroperitoneal lymph nodes are not pathologically enlarged. Reproductive: Hysterectomy. Stable indistinct density along the top of the vaginal cuff. Other: Postoperative findings from prior omentectomy. Minimal residual nodularity along the left upper quadrant omentum, with small nodules in this vicinity measuring up to 6 mm in long axis. This nodularity was previously similar. Musculoskeletal: Postoperative findings in the lower lumbar spine. Lumbar spondylosis and degenerative disc disease. IMPRESSION: 1. Essentially stable minimal residual nodularity in the left upper quadrant omentum, meriting surveillance. 2. There are 2 new small nodular densities anteriorly in the right lower lobe, the larger measuring 0.8 by 0.5 cm. These are along a band of atelectasis, and could be due to airway plugging or a  similar benign lesion, but could also be metastatic and merit surveillance. 3. Stable indistinct density along the vaginal cuff. 4. Other imaging findings of potential clinical significance: Aortic Atherosclerosis (ICD10-I70.0). Prominent stool throughout the colon favors constipation. Electronically Signed   By: Van Clines M.D.   On: 06/19/2017 12:19   Ct Abdomen Pelvis W Contrast  Result Date: 06/19/2017 CLINICAL DATA:  Restaging of ovarian cancer. Shortness of breath and cough. Chemotherapy. EXAM: CT CHEST, ABDOMEN, AND PELVIS WITH CONTRAST TECHNIQUE: Multidetector CT imaging of the chest, abdomen and pelvis was performed following the standard protocol during bolus administration of intravenous contrast. CONTRAST:  173mL ISOVUE-300 IOPAMIDOL (ISOVUE-300) INJECTION 61% COMPARISON:  Multiple exams, including 04/16/2017 FINDINGS: CT CHEST FINDINGS Cardiovascular: Atherosclerotic calcification of the aortic arch and branch vasculature. Right Port-A-Cath tip: SVC. Mediastinum/Nodes: No pathologic adenopathy observed. Lungs/Pleura: 3 by 4 mm right middle lobe subpleural nodule on image 85/4 appears stable. Mild subsegmental atelectasis along both hemidiaphragms. Calcified granuloma in the lingula. Two small nodular densities anteriorly in the right lower lobe appear new, 1 measuring 0.8 by 0.3 cm on image 103/4 and an adjacent nodular density measuring 0.8 by 0.5 cm on image 99. Both of these are along a band of atelectasis. Although these could be from airway plugging or a similar benign etiology, metastatic lesions are not readily excluded. Musculoskeletal: Lower cervical plate and screw fixator. Thoracic spondylosis and degenerative disc disease. No compelling findings of osseous metastatic disease in the thoracic spine. CT ABDOMEN PELVIS FINDINGS Hepatobiliary: Extrahepatic biliary dilatation with common bile duct up to 1.3 cm, essentially stable. Cholecystectomy noted. The liver appears otherwise  unremarkable. Pancreas: Unremarkable Spleen: Unremarkable Adrenals/Urinary Tract: Adrenal glands normal. The kidneys and urinary bladder appear normal. Stomach/Bowel: Prominent stool throughout the colon favors constipation. Vascular/Lymphatic:  Aortoiliac atherosclerotic vascular disease. Small retroperitoneal lymph nodes are not pathologically enlarged. Reproductive: Hysterectomy. Stable indistinct density along the top of the vaginal cuff. Other: Postoperative findings from prior omentectomy. Minimal residual nodularity along the left upper quadrant omentum, with small nodules in this vicinity measuring up to 6 mm in long axis. This nodularity was previously similar. Musculoskeletal: Postoperative findings in the lower lumbar spine. Lumbar spondylosis and degenerative disc disease. IMPRESSION: 1. Essentially stable minimal residual nodularity in the left upper quadrant omentum, meriting surveillance. 2. There are 2 new small nodular densities anteriorly in the right lower lobe, the larger measuring 0.8 by 0.5 cm. These are along a band of atelectasis, and could be due to airway plugging or a similar benign lesion, but could also be metastatic and merit surveillance. 3. Stable indistinct density along the vaginal cuff. 4. Other imaging findings of potential clinical significance: Aortic Atherosclerosis (ICD10-I70.0). Prominent stool throughout the colon favors constipation. Electronically Signed   By: Van Clines M.D.   On: 06/19/2017 12:19   Mr Shoulder Right Wo Contrast  Result Date: 06/11/2017 CLINICAL DATA:  Status post fall. Generalized shoulder pain. Getting worse. EXAM: MRI OF THE RIGHT SHOULDER WITHOUT CONTRAST TECHNIQUE: Multiplanar, multisequence MR imaging of the shoulder was performed. No intravenous contrast was administered. COMPARISON:  None. FINDINGS: Patient motion degrading image quality limiting evaluation. Rotator cuff: Complete tear of the supraspinatus tendon with 3.3 cm of  retraction. Moderate tendinosis of the infraspinatus tendon without a discrete tear. Teres minor tendon is intact. Subscapularis tendon is intact. Muscles: Mild muscle atrophy of the supraspinatus muscle. Remainder the rotator cuff muscles demonstrate no significant atrophy. Remainder of the muscles surrounding the right shoulder are normal. Biceps long head: Severe tendinosis of the intra-articular portion of the long head of the biceps tendon. Acromioclavicular Joint: Mild arthropathy of the acromioclavicular joint. Type I acromion. Small amount of subacromial/subdeltoid bursal fluid. Glenohumeral Joint: No joint effusion. Generalized chondral thinning. Labrum:  No discrete labral tear. Bones:  No acute osseous abnormality.  No aggressive osseous lesion. Other: No fluid collection or hematoma. IMPRESSION: Patient motion degrades image quality limiting evaluation. 1. Complete tear of the supraspinatus tendon with 3.3 cm of retraction. Mild muscle atrophy of the supraspinatus muscle. 2. Moderate tendinosis of the infraspinatus tendon without a discrete tear. 3. Severe tendinosis of the intra-articular portion of the long head of the biceps tendon. Electronically Signed   By: Kathreen Devoid   On: 06/11/2017 09:20     Assessment and plan- Patient is a 62 y.o. female who presents for persistent weakness, fatigue resulting in several falls last week.  1. Ovarian cancer: Status post 7 cycles of carbo/Taxol and 1 cycle of carbo only. Last chemotherapy was on 05/19/2017. Chemotherapy is currently on hold due to persistent pancytopenia. She is scheduled to see Dr. Grayland Ormond on 07/21/2017 for labs, evaluation and possible continuation of chemotherapy.  2. Weakness/fatigue: This is likely multifactorial. Will rule out urinary tract infection, influenza or significant electrolyte abnormality.   3. Worsening peripheral neuropathy: Patient currently taking gabapentin 300 mg once in the morning and 900 mg before bedtime.  Per Dr. Gary Fleet prescription, patient may take 900 mg TID. Spoke to patient about potentially adjusting dosing for her gabapentin. She has decided on 600 mg in the morning, 300 mg in the afternoon and 900 mg at bedtime to see if symptoms improved. She also has scheduled an appointment for acupuncture to see if this improves her symptoms.   4. Falls: Patient recently reported  falls 3. These all occurred last week, one while gardening, one while riding her bike and one while walking. Patient denies any neuropathy in toes or feet. Patient has good foot placement awareness. Patient was not orthostatic when checked in office today. Likely from dehydration and lack of appropriate nutrients. Patient has lost 4 pounds since her visit earlier in the month. Likely malnourished. Spoke at length about supplementation and maintaining adequate hydration.  5. Left shoulder pain due to fall: no obvious deformity to left shoulder. Patient recently had MRI of right shoulder and is due for a left shoulder MRI within the next month. Patient states left shoulder is improving daily. No obvious deformity to left shoulder. Will let patient follow-up with orthopedic physician given symptoms are improving.  Workup: Labs ( CBC with differential, CMET, urine culture, urinalysis, influenza and magnesium) labs looked pretty good. Hypokalemia at 3.3. Urinalysis questionable for urinary tract infection. Patient asymptomatic. Will wait for culture. Influenza panel pending.  Plan: Give 1 L normal saline with 20 mEq of potassium over 2 hours. We will give 10 mg Decadron IV.  Patient reassessed after fluids administered. Patient states she "feels better". Vitals remained stable.  New prescriptions: RX 5 mg prednisone daily for 10 days to help with fatigue, weakness and appetite.  RX for re-fill on oxycodone 10-325 mg tablets Q8 hours PRN.  [place] Controlled Substance Reporting System reviewed and refill is appropriate on or after  05/28/17. Medication e-scribed to her pharmacy [facility]) using [employer]'s 2-step verification process.    [place] reviewed:       Visit Diagnosis No diagnosis found.  Patient expressed understanding and was in agreement with this plan. She also understands that She can call clinic at any time with any questions, concerns, or complaints.   Greater than 50% was spent in counseling and coordination of care with this patient including but not limited to discussion of the relevant topics above (See A&P) including, but not limited to diagnosis and management of acute and chronic medical conditions.    Faythe Casa, AGNP-C [facility] [place] [facility]- [phone] Pager- [phone] 07/09/2017 9:29 AM

## 2017-07-10 ENCOUNTER — Encounter: Payer: Self-pay | Admitting: Oncology

## 2017-07-10 LAB — URINE CULTURE: Culture: 80000 — AB

## 2017-07-10 MED ORDER — SODIUM CHLORIDE 0.9% FLUSH
10.0000 mL | INTRAVENOUS | Status: DC | PRN
  Filled 2017-07-10: qty 10

## 2017-07-10 MED ORDER — HEPARIN SOD (PORK) LOCK FLUSH 100 UNIT/ML IV SOLN
500.0000 [IU] | Freq: Once | INTRAVENOUS | Status: AC
  Administered 2017-07-09: 500 [IU] via INTRAVENOUS

## 2017-07-14 ENCOUNTER — Inpatient Hospital Stay: Payer: PPO

## 2017-07-17 ENCOUNTER — Ambulatory Visit: Payer: PPO

## 2017-07-18 NOTE — Progress Notes (Signed)
[place]  [phone] [phone]) [phone]  ID: Jessica Burgess OB: January 16, 1956  MR#: [id number]  GUY#:[id number]  Patient Care Team: Steele Sizer, MD as PCP - General (Family Medicine) Lloyd Huger, MD as Consulting Physician (Oncology) Mellody Drown, MD as Consulting Physician (Obstetrics and Gynecology)  CHIEF COMPLAINT: Stage IIIc high-grade serous ovarian carcinoma  INTERVAL HISTORY: Patient returns to clinic today for further evaluation and reinitiation of treatment.  Although her imaging looks improved, she is noted to have a slowly increasing Ca1 25.  She currently feels well and is asymptomatic.  She continues to have peripheral neuropathy, which is unchanged.  She has no other neurologic complaints.  She denies any chest pain, shortness of breath, or cough.  She has a good appetite and denies any nausea, vomiting, constipation, or diarrhea.  She has no urinary complaints.  Patient offers no further specific complaints today.  REVIEW OF SYSTEMS:   Review of Systems  Constitutional: Negative.  Negative for fever, malaise/fatigue and weight loss.  Respiratory: Negative.  Negative for cough and shortness of breath.   Cardiovascular: Negative.  Negative for chest pain and leg swelling.  Gastrointestinal: Negative.  Negative for abdominal pain, blood in stool, constipation, diarrhea, melena, nausea and vomiting.  Genitourinary: Negative.   Musculoskeletal: Negative.  Negative for back pain, joint pain and neck pain.  Skin: Negative.  Negative for rash.  Neurological: Positive for tingling and sensory change. Negative for focal weakness and weakness.  Psychiatric/Behavioral: The patient is nervous/anxious.     As per HPI. Otherwise, a complete review of systems is negative.  PAST MEDICAL HISTORY: Past Medical History:  Diagnosis Date  . Allergic rhinitis, cause unspecified   . Anxiety state, unspecified   . Arthritis   . Asthma    only when sick   . Backache, unspecified   . Bronchitis    hx of when get sick  . Cancer ([place])    skin cancer , basal cell   . Cancer ([name]) 11/2016   ovarian  . Cervicalgia   . Complication of anesthesia   . Dermatophytosis of nail   . Dysmetabolic syndrome X   . Encounter for long-term (current) use of other medications   . Esophageal reflux   . Insomnia, unspecified   . Leukocytosis, unspecified   . Migraine without aura, without mention of intractable migraine without mention of status migrainosus   . Other and unspecified hyperlipidemia   . Other malaise and fatigue   . Overweight(278.02)   . Personal history of chemotherapy now   ovarian  . PONV (postoperative nausea and vomiting)   . Spinal stenosis in cervical region   . Symptomatic menopausal or female climacteric states   . Unspecified disorder of skin and subcutaneous tissue   . Unspecified vitamin D deficiency     PAST SURGICAL HISTORY: Past Surgical History:  Procedure Laterality Date  . ABDOMINAL HYSTERECTOMY    . ANTERIOR CERVICAL DECOMP/DISCECTOMY FUSION N/A 06/07/2015   Procedure: Cervical three - four and Cervical six- seven anterior cervical decompression with fusion interbody prosthesis plating and bonegraft;  Surgeon: Newman Pies, MD;  Location: [facility];  Service: Neurosurgery;  Laterality: N/A;  C34 and C67 anterior cervical decompression with fusion interbody prosthesis plating and bonegraft  . BACK SURGERY     x2 Lower   . EVACUATION OF CERVICAL HEMATOMA N/A 06/14/2015   Procedure: EVACUATION OF CERVICAL HEMATOMA;  Surgeon: Newman Pies, MD;  Location: [facility];  Service: Neurosurgery;  Laterality: N/A;  . NECK SURGERY     x3  . TONSILLECTOMY      FAMILY HISTORY: Family History  Problem Relation Age of Onset  . Depression Mother   . Migraines Mother   . Dementia Father   . Diabetes Father   . Hyperlipidemia Father   . Hyperlipidemia Brother   . Hyperlipidemia Brother   .  Breast cancer Paternal Aunt        58s    ADVANCED DIRECTIVES (Y/N):  N  HEALTH MAINTENANCE: Social History   Tobacco Use  . Smoking status: Former Smoker    Packs/day: 1.50    Years: 20.00    Pack years: 30.00    Types: Cigarettes    Start date: 03/19/1979    Last attempt to quit: 08/25/1999    Years since quitting: 17.9  . Smokeless tobacco: Never Used  . Tobacco comment: smoking cessation materials not required  Substance Use Topics  . Alcohol use: Not Currently    Alcohol/week: 0.0 oz  . Drug use: No     Colonoscopy:  PAP:  Bone density:  Lipid panel:  No Known Allergies  Current Outpatient Medications  Medication Sig Dispense Refill  . albuterol (PROVENTIL HFA;VENTOLIN HFA) 108 (90 Base) MCG/ACT inhaler Inhale 2 puffs into the lungs every 6 (six) hours as needed for wheezing or shortness of breath. 1 Inhaler 2  . albuterol (PROVENTIL) (2.5 MG/3ML) 0.083% nebulizer solution Take 3 mLs (2.5 mg total) by nebulization every 6 (six) hours as needed for wheezing or shortness of breath. 75 mL 2  . ALPRAZolam (XANAX) 0.25 MG tablet Take 1 tablet (0.25 mg total) by mouth 2 (two) times daily as needed for anxiety. 60 tablet 0  . Ascorbic Acid (VITAMIN C PO) Take by mouth.    . budesonide-formoterol (SYMBICORT) 160-4.5 MCG/ACT inhaler Inhale 2 puffs into the lungs 2 (two) times daily.    . Cholecalciferol (VITAMIN D-1000 MAX ST) 1000 UNITS tablet Take 1 tablet by mouth 2 (two) times daily.    . Cyanocobalamin (B-12 PO) Take 1 tablet by mouth daily.     . cyclobenzaprine (FLEXERIL) 10 MG tablet TAKE 1 TABLET BY MOUTH THREE TIMES A DAY IF NEEDED FOR MUSCLE SPASM 30 tablet 0  . DULoxetine (CYMBALTA) 60 MG capsule TAKE 1 CAPSULE BY MOUTH ONCE DAILY 90 capsule 0  . estradiol (ESTRACE) 0.5 MG tablet take 1 tablet by mouth once daily 90 tablet 4  . ferrous sulfate (IRON SUPPLEMENT) 325 (65 FE) MG tablet Take 1 tablet by mouth daily.    . fluticasone (FLONASE) 50 MCG/ACT nasal spray Place  2 sprays into both nostrils as needed. 16 g 5  . gabapentin (NEURONTIN) 300 MG capsule Take 1-3 capsules (300-900 mg total) by mouth 3 (three) times daily. 300 mg twice a day and 900 mg at night 540 capsule 1  . ipratropium (ATROVENT HFA) 17 MCG/ACT inhaler Inhale 2 puffs into the lungs every 6 (six) hours.    . Magnesium Oxide 500 MG CAPS Take 500 mg by mouth 2 (two) times daily.     . Multiple Vitamins-Minerals (MULTIVITAMIN PO) Take 1 tablet by mouth daily.     . Omega-3 Fatty Acids (FISH OIL PO) Take by mouth.    . ondansetron (ZOFRAN) 8 MG tablet Take 1 tablet (8 mg total) by mouth 2 (two) times daily as needed for refractory nausea / vomiting. 60 tablet 2  . oxyCODONE-acetaminophen (PERCOCET) 10-325 MG tablet Take 1 tablet by mouth every  8 (eight) hours as needed for pain. 90 tablet 0  . potassium gluconate 595 MG TABS tablet Take 595 mg by mouth daily.    . predniSONE (DELTASONE) 5 MG tablet Take 1 tablet (5 mg total) by mouth daily with breakfast. 10 tablet 0  . prochlorperazine (COMPAZINE) 10 MG tablet Take 1 tablet (10 mg total) by mouth every 6 (six) hours as needed (Nausea or vomiting). 60 tablet 2  . SUMAtriptan (IMITREX) 100 MG tablet take 1 tablet by mouth if needed for migraines as directed 27 tablet 1  . zolpidem (AMBIEN CR) 6.25 MG CR tablet Take 1 tablet (6.25 mg total) by mouth at bedtime. 90 tablet 0  . fentaNYL (DURAGESIC - DOSED MCG/HR) 50 MCG/HR Place 1 patch (50 mcg total) onto the skin every 3 (three) days. 10 patch 0   No current facility-administered medications for this visit.    Facility-Administered Medications Ordered in Other Visits  Medication Dose Route Frequency Provider Last Rate Last Dose  . 0.9 %  sodium chloride infusion   Intravenous Once Lloyd Huger, MD      . 0.9 %  sodium chloride infusion   Intravenous Once Grayland Ormond, Kathlene November, MD      . [COMPLETED] CARBOplatin (PARAPLATIN) 700 mg in sodium chloride 0.9 % 250 mL chemo infusion  700 mg  Intravenous Once Lloyd Huger, MD   Stopped at 07/21/17 1347  . dexamethasone (DECADRON) 20 mg in sodium chloride 0.9 % 50 mL IVPB  20 mg Intravenous Once Lloyd Huger, MD      . dexamethasone (DECADRON) injection 10 mg  10 mg Intravenous Once Lloyd Huger, MD      . sodium chloride flush (NS) 0.9 % injection 10 mL  10 mL Intravenous PRN Lloyd Huger, MD   10 mL at 06/16/17 0854  . sodium chloride flush (NS) 0.9 % injection 10 mL  10 mL Intravenous PRN Lloyd Huger, MD   10 mL at 07/21/17 1111    OBJECTIVE: Vitals:   07/21/17 1143  BP: 127/78  Pulse: 75  Resp: 18  Temp: (!) 96.9 F (36.1 C)     Body mass index is 27.72 kg/m.    ECOG FS:0 - Asymptomatic  General: Well-developed, well-nourished, no acute distress. Eyes: Pink conjunctiva, anicteric sclera. HEENT: Normocephalic, moist mucous membranes, clear oropharnyx. Lungs: Clear to auscultation bilaterally. Heart: Regular rate and rhythm. No rubs, murmurs, or gallops. Abdomen: Soft, nontender, nondistended. No organomegaly noted, normoactive bowel sounds. Musculoskeletal: No edema, cyanosis, or clubbing. Neuro: Alert, answering all questions appropriately. Cranial nerves grossly intact. Skin: No rashes or petechiae noted. Psych: Normal affect.  LAB RESULTS:  Lab Results  Component Value Date   NA 137 07/21/2017   K 3.1 (L) 07/21/2017   CL 100 (L) 07/21/2017   CO2 26 07/21/2017   GLUCOSE 111 (H) 07/21/2017   BUN 13 07/21/2017   CREATININE 0.81 07/21/2017   CALCIUM 9.0 07/21/2017   PROT 6.7 07/21/2017   ALBUMIN 3.6 07/21/2017   AST 26 07/21/2017   ALT 17 07/21/2017   ALKPHOS 73 07/21/2017   BILITOT 0.4 07/21/2017   GFRNONAA >60 07/21/2017   GFRAA >60 07/21/2017    Lab Results  Component Value Date   WBC 6.2 07/21/2017   NEUTROABS 3.3 07/21/2017   HGB 11.9 (L) 07/21/2017   HCT 33.6 (L) 07/21/2017   MCV 102.8 (H) 07/21/2017   PLT 242 07/21/2017     STUDIES: No results  found.  ASSESSMENT:  Stage IIIc high-grade serous ovarian carcinoma  PLAN:  1. Stage IIIc high-grade serous ovarian carcinoma:  She underwent debulking surgery at [facility] on November 19, 2016. She was also initiated on a clinical trial and has received preoperative infusion of Keytruda on November 11, 2016.  Because of patient's persistent peripheral neuropathy as well as pancytopenia, Taxol was discussed continued altogether.  CT scan from June 19, 2017 is essentially unchanged from previous scan in January.  Unfortunately, Ca1 25 continues to trend up.  Proceed with single agent carboplatin today, but likely will need to add in a second chemotherapy to get better control of disease.  Return to clinic in 2 weeks for laboratory work and treatment planning with the idea of treating 3 weeks from today.     2. Pain: Continue  50 mcg fentanyl patch and oxycodone as needed.  Previously discussed with patient about referring back to PCP for pain management at conclusion of therapy.   3. Anxiety: Continue Xanax as prescribed. 4.  Peripheral neuropathy: Discontinue Taxol as above.  Continue gabapentin as prescribed. 5.  Anemia: Patient's hemoglobin is now nearly within normal limits.  Approximately 30 minutes spent in discussion of which greater than 50% was consultation.  Patient expressed understanding and was in agreement with this plan. She also understands that She can call clinic at any time with any questions, concerns, or complaints.   Cancer Staging Ovarian cancer, unspecified laterality ([place]) Staging form: Ovary, Fallopian Tube, and Primary Peritoneal Carcinoma, AJCC 8th Edition - Clinical: No stage assigned - Unsigned   Lloyd Huger, MD   07/21/2017 2:22 PM

## 2017-07-21 ENCOUNTER — Inpatient Hospital Stay: Payer: PPO

## 2017-07-21 ENCOUNTER — Other Ambulatory Visit: Payer: Self-pay | Admitting: *Deleted

## 2017-07-21 ENCOUNTER — Inpatient Hospital Stay: Payer: PPO | Attending: Oncology

## 2017-07-21 ENCOUNTER — Inpatient Hospital Stay (HOSPITAL_BASED_OUTPATIENT_CLINIC_OR_DEPARTMENT_OTHER): Payer: PPO | Admitting: Oncology

## 2017-07-21 ENCOUNTER — Encounter: Payer: Self-pay | Admitting: Oncology

## 2017-07-21 VITALS — BP 127/78 | HR 75 | Temp 96.9°F | Resp 18 | Wt 156.5 lb

## 2017-07-21 DIAGNOSIS — D649 Anemia, unspecified: Secondary | ICD-10-CM

## 2017-07-21 DIAGNOSIS — Z87891 Personal history of nicotine dependence: Secondary | ICD-10-CM

## 2017-07-21 DIAGNOSIS — C7960 Secondary malignant neoplasm of unspecified ovary: Secondary | ICD-10-CM

## 2017-07-21 DIAGNOSIS — F419 Anxiety disorder, unspecified: Secondary | ICD-10-CM

## 2017-07-21 DIAGNOSIS — R52 Pain, unspecified: Secondary | ICD-10-CM | POA: Diagnosis not present

## 2017-07-21 DIAGNOSIS — C569 Malignant neoplasm of unspecified ovary: Secondary | ICD-10-CM | POA: Insufficient documentation

## 2017-07-21 DIAGNOSIS — G629 Polyneuropathy, unspecified: Secondary | ICD-10-CM | POA: Insufficient documentation

## 2017-07-21 DIAGNOSIS — Z5111 Encounter for antineoplastic chemotherapy: Secondary | ICD-10-CM | POA: Insufficient documentation

## 2017-07-21 LAB — COMPREHENSIVE METABOLIC PANEL
ALT: 17 U/L (ref 14–54)
ANION GAP: 11 (ref 5–15)
AST: 26 U/L (ref 15–41)
Albumin: 3.6 g/dL (ref 3.5–5.0)
Alkaline Phosphatase: 73 U/L (ref 38–126)
BUN: 13 mg/dL (ref 6–20)
CHLORIDE: 100 mmol/L — AB (ref 101–111)
CO2: 26 mmol/L (ref 22–32)
CREATININE: 0.81 mg/dL (ref 0.44–1.00)
Calcium: 9 mg/dL (ref 8.9–10.3)
GFR calc non Af Amer: >60 mL/min (ref >60)
Glucose, Bld: 111 mg/dL — ABNORMAL HIGH (ref 65–99)
POTASSIUM: 3.1 mmol/L — AB (ref 3.5–5.1)
Sodium: 137 mmol/L (ref 135–145)
Total Bilirubin: 0.4 mg/dL (ref 0.3–1.2)
Total Protein: 6.7 g/dL (ref 6.5–8.1)

## 2017-07-21 LAB — CBC WITH DIFFERENTIAL/PLATELET
Basophils Absolute: 0 10*3/uL (ref 0–0.1)
Basophils Relative: 1 %
EOS ABS: 0.2 10*3/uL (ref 0–0.7)
Eosinophils Relative: 4 %
HEMATOCRIT: 33.6 % — AB (ref 35.0–47.0)
HEMOGLOBIN: 11.9 g/dL — AB (ref 12.0–16.0)
LYMPHS ABS: 2.1 10*3/uL (ref 1.0–3.6)
LYMPHS PCT: 34 %
MCH: 36.3 pg — ABNORMAL HIGH (ref 26.0–34.0)
MCHC: 35.3 g/dL (ref 32.0–36.0)
MCV: 102.8 fL — ABNORMAL HIGH (ref 80.0–100.0)
MONOS PCT: 9 %
Monocytes Absolute: 0.6 10*3/uL (ref 0.2–0.9)
NEUTROS ABS: 3.3 10*3/uL (ref 1.4–6.5)
NEUTROS PCT: 52 %
Platelets: 242 10*3/uL (ref 150–440)
RBC: 3.27 MIL/uL — AB (ref 3.80–5.20)
RDW: 14.3 % (ref 11.5–14.5)
WBC: 6.2 10*3/uL (ref 3.6–11.0)

## 2017-07-21 MED ORDER — HEPARIN SOD (PORK) LOCK FLUSH 100 UNIT/ML IV SOLN
500.0000 [IU] | Freq: Once | INTRAVENOUS | Status: AC
  Administered 2017-07-21: 500 [IU] via INTRAVENOUS
  Filled 2017-07-21: qty 5

## 2017-07-21 MED ORDER — SODIUM CHLORIDE 0.9% FLUSH
10.0000 mL | INTRAVENOUS | Status: DC | PRN
  Administered 2017-07-21: 10 mL via INTRAVENOUS
  Filled 2017-07-21: qty 10

## 2017-07-21 MED ORDER — SODIUM CHLORIDE 0.9 % IV SOLN
Freq: Once | INTRAVENOUS | Status: AC
  Administered 2017-07-21: 13:00:00 via INTRAVENOUS
  Filled 2017-07-21: qty 1000

## 2017-07-21 MED ORDER — SODIUM CHLORIDE 0.9 % IV SOLN
700.0000 mg | Freq: Once | INTRAVENOUS | Status: AC
  Administered 2017-07-21: 700 mg via INTRAVENOUS
  Filled 2017-07-21: qty 70

## 2017-07-21 MED ORDER — PALONOSETRON HCL INJECTION 0.25 MG/5ML
0.2500 mg | Freq: Once | INTRAVENOUS | Status: AC
  Administered 2017-07-21: 0.25 mg via INTRAVENOUS
  Filled 2017-07-21: qty 5

## 2017-07-21 MED ORDER — SODIUM CHLORIDE 0.9 % IV SOLN: 10.0000 mg | Freq: Once | INTRAVENOUS | Status: DC

## 2017-07-21 MED ORDER — LORAZEPAM 2 MG/ML IJ SOLN
0.5000 mg | Freq: Once | INTRAMUSCULAR | Status: AC
  Administered 2017-07-21: 0.5 mg via INTRAVENOUS
  Filled 2017-07-21: qty 1

## 2017-07-21 MED ORDER — DEXAMETHASONE SODIUM PHOSPHATE 10 MG/ML IJ SOLN
10.0000 mg | Freq: Once | INTRAMUSCULAR | Status: AC
  Administered 2017-07-21: 10 mg via INTRAVENOUS
  Filled 2017-07-21: qty 1

## 2017-07-21 MED ORDER — DIPHENHYDRAMINE HCL 50 MG/ML IJ SOLN
25.0000 mg | Freq: Once | INTRAMUSCULAR | Status: AC
  Administered 2017-07-21: 25 mg via INTRAVENOUS
  Filled 2017-07-21: qty 1

## 2017-07-21 MED ORDER — FENTANYL 50 MCG/HR TD PT72: 50.0000 ug | MEDICATED_PATCH | TRANSDERMAL | 0 refills | Status: DC

## 2017-07-21 NOTE — Progress Notes (Signed)
Patient denies concerns today, would like to discuss plan moving forward.

## 2017-07-22 ENCOUNTER — Other Ambulatory Visit: Payer: Self-pay | Admitting: Family Medicine

## 2017-07-22 DIAGNOSIS — G4700 Insomnia, unspecified: Secondary | ICD-10-CM

## 2017-07-22 LAB — CA 125: Cancer Antigen (CA) 125: 136.9 U/mL — ABNORMAL HIGH (ref 0.0–38.1)

## 2017-07-22 NOTE — Telephone Encounter (Signed)
Refill request for general medication: Ambien CR 6.25 mg  Last office visit: 04/25/2017    Last physical exam: 06/13/2017  Follow-ups on file. 07/28/2017

## 2017-07-24 ENCOUNTER — Encounter: Payer: Self-pay | Admitting: Oncology

## 2017-07-28 ENCOUNTER — Encounter: Payer: Self-pay | Admitting: Family Medicine

## 2017-07-28 ENCOUNTER — Encounter: Payer: Self-pay | Admitting: Oncology

## 2017-07-28 ENCOUNTER — Ambulatory Visit (INDEPENDENT_AMBULATORY_CARE_PROVIDER_SITE_OTHER): Payer: PPO | Admitting: Family Medicine

## 2017-07-28 VITALS — BP 130/80 | HR 97 | Resp 16 | Ht 63.0 in | Wt 157.9 lb

## 2017-07-28 DIAGNOSIS — R718 Other abnormality of red blood cells: Secondary | ICD-10-CM | POA: Diagnosis not present

## 2017-07-28 DIAGNOSIS — D61810 Antineoplastic chemotherapy induced pancytopenia: Secondary | ICD-10-CM | POA: Diagnosis not present

## 2017-07-28 DIAGNOSIS — D696 Thrombocytopenia, unspecified: Secondary | ICD-10-CM | POA: Diagnosis not present

## 2017-07-28 DIAGNOSIS — Z9229 Personal history of other drug therapy: Secondary | ICD-10-CM

## 2017-07-28 DIAGNOSIS — C569 Malignant neoplasm of unspecified ovary: Secondary | ICD-10-CM | POA: Diagnosis not present

## 2017-07-28 DIAGNOSIS — F329 Major depressive disorder, single episode, unspecified: Secondary | ICD-10-CM | POA: Diagnosis not present

## 2017-07-28 DIAGNOSIS — R739 Hyperglycemia, unspecified: Secondary | ICD-10-CM

## 2017-07-28 DIAGNOSIS — G4700 Insomnia, unspecified: Secondary | ICD-10-CM | POA: Diagnosis not present

## 2017-07-28 DIAGNOSIS — E032 Hypothyroidism due to medicaments and other exogenous substances: Secondary | ICD-10-CM | POA: Diagnosis not present

## 2017-07-28 DIAGNOSIS — E559 Vitamin D deficiency, unspecified: Secondary | ICD-10-CM | POA: Diagnosis not present

## 2017-07-28 DIAGNOSIS — E039 Hypothyroidism, unspecified: Secondary | ICD-10-CM | POA: Diagnosis not present

## 2017-07-28 MED ORDER — FLUOXETINE HCL 40 MG PO CAPS
40.0000 mg | ORAL_CAPSULE | Freq: Every day | ORAL | 0 refills | Status: DC
Start: 2017-07-28 — End: 2017-09-29

## 2017-07-28 NOTE — Progress Notes (Signed)
Name: Jessica Burgess   MRN: [id number]    DOB: 1955-11-30   Date:07/28/2017       Progress Note  Subjective  Chief Complaint  Chief Complaint  Patient presents with  . Pain    chronic back pain  . Migraine    HPI  Ovarian cancer: history of hysterectomy for fibroid many years ago, developed acute left lower abdominal pain and saw her GI and CT showed abnormal omentum, suspicious for ovarian cancer. She had surgical debulking surgery, severe pancytopenia back in March and had blood transfusion last levels were better and had another round of chemo last week.   Chronic back pain; previous history of back surgery, still has daily pain. Took Fentanyl patch years ago but caused worsening of migraines at the time, however since placed back on medication by hematologist she is doing well, she also has been taking oxycodone prn, given 90 pills by oncologist in December. . She has been taking Percocet for many years. Explained risk of concomitant use of Ambien, BZD and Flexeril and she was off BZD and Flexeril, however back on medication since diagnosed with cancer. Unchanged. .   Insomnia: takes She is doing well on Ambien CR at this time, unchanged.   Depression Major : she is stable at this time, she had a set back shortly after cancer diagnosis, she feels like duloxetine is not as good as prozac and would like to go back on medication. For a period of time she could not even read the bible or focus on anything, doing a little better at this time.   Metabolic Syndrome/Prediabetes: hgbA1Cwasdown, she is on Metformin. She denies polyphagia, polyuria or polydipsia.   Cervical spine stenosis and radiculitis: s/p neck ( last surgery Dr. Arnoldo Morale March 2017 ) surgery was zero at times, but it is getting worse again, aching like pain, she states most days has a band sensation on forehead , taking imitrex more often, she will try to see chiropractor today    Migraine headache: she was doing  well after neck surgery, but is doing better, 6-8 episodes a month, taking Gabapentin, and seems to be helping.  Hypothyroidism: TSH was elevated and started on Synthroid 50 mcg last week by oncologist, no symptoms of hyperthyroidism at this time, recheck it again now.   Paresthesia: she states chemo is causing severe numbness on hands and feet, on higher dose of gabapentin is doing better , we will also check b12  Hypothyroidism: recheck level, she is taking medication as prescribed  Patient Active Problem List   Diagnosis Date Noted  . Genetic testing 12/18/2016  . Migraines 12/01/2016  . Postoperative seroma of subcutaneous tissue after non-dermatologic procedure 12/01/2016  . Transaminitis 12/01/2016  . Anemia associated with acute blood loss 11/20/2016  . Ovarian cancer, unspecified laterality ([place]) 11/12/2016  . 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[place]) 11/05/2016  . Examination of participant in clinical trial 11/01/2016  . Chronic right-sided low back pain with right-sided sciatica 02/02/2016  . History of neck surgery 08/01/2015  . Hematoma 06/14/2015  . Perennial allergic rhinitis 05/05/2015  . Generalized anxiety disorder 02/03/2015  . Back pain, chronic 08/25/2014  . Insomnia, persistent 08/25/2014  . Chronic cervical pain 08/25/2014  . Major depression, chronic ([place]) 08/25/2014  . Dyslipidemia 08/25/2014  . Gastro-esophageal reflux disease without esophagitis 08/25/2014  . H/O high risk medication treatment 08/25/2014  . Blood glucose elevated 08/25/2014  . Migraine without aura and without status migrainosus, not intractable 08/25/2014  .  Climacteric 08/25/2014  . Dysmetabolic syndrome 86/76/1951  . Fungal infection of toenail 08/25/2014  . Obesity (BMI 30.0-34.9) 08/25/2014  . Vitamin D deficiency 08/25/2014  . Engages in travel abroad 08/25/2014  . Cervical disc disorder with radiculopathy 04/28/2013    Past Surgical History:  Procedure  Laterality Date  . ABDOMINAL HYSTERECTOMY    . ANTERIOR CERVICAL DECOMP/DISCECTOMY FUSION N/A 06/07/2015   Procedure: Cervical three - four and Cervical six- seven anterior cervical decompression with fusion interbody prosthesis plating and bonegraft;  Surgeon: Newman Pies, MD;  Location: [place] [facility];  Service: Neurosurgery;  Laterality: N/A;  C34 and C67 anterior cervical decompression with fusion interbody prosthesis plating and bonegraft  . BACK SURGERY     x2 Lower   . EVACUATION OF CERVICAL HEMATOMA N/A 06/14/2015   Procedure: EVACUATION OF CERVICAL HEMATOMA;  Surgeon: Newman Pies, MD;  Location: [facility];  Service: Neurosurgery;  Laterality: N/A;  . NECK SURGERY     x3  . TONSILLECTOMY      Family History  Problem Relation Age of Onset  . Depression Mother   . Migraines Mother   . Dementia Father   . Diabetes Father   . Hyperlipidemia Father   . Hyperlipidemia Brother   . Hyperlipidemia Brother   . Breast cancer Paternal Aunt        24s    Social History   Socioeconomic History  . Marital status: Single    Spouse name: Not on file  . Number of children: 0  . Years of education: some college  . Highest education level: 12th grade  Occupational History    Employer: DISABLED  . Occupation: Disabled   Social Needs  . Financial resource strain: Very hard  . Food insecurity:    Worry: Never true    Inability: Never true  . Transportation needs:    Medical: No    Non-medical: No  Tobacco Use  . Smoking status: Former Smoker    Packs/day: 1.50    Years: 20.00    Pack years: 30.00    Types: Cigarettes    Start date: 03/19/1979    Last attempt to quit: 08/25/1999    Years since quitting: 17.9  . Smokeless tobacco: Never Used  . Tobacco comment: smoking cessation materials not required  Substance and Sexual Activity  . Alcohol use: Not Currently    Alcohol/week: 0.0 oz  . Drug use: No  . Sexual activity: Never  Lifestyle  . Physical activity:    Days  per week: 0 days    Minutes per session: 0 min  . Stress: Very much  Relationships  . Social connections:    Talks on phone: Patient refused    Gets together: Patient refused    Attends religious service: Patient refused    Active member of club or organization: Patient refused    Attends meetings of clubs or organizations: Patient refused    Relationship status: Patient refused  . Intimate partner violence:    Fear of current or ex partner: No    Emotionally abused: No    Physically abused: No    Forced sexual activity: No  Other Topics Concern  . Not on file  Social History Narrative   Patient is single.    Patient lives with roommates.    Patient on disability    Patient has no children.    Patient has some college      Current Outpatient Medications:  .  albuterol (PROVENTIL HFA;VENTOLIN  HFA) 108 (90 Base) MCG/ACT inhaler, Inhale 2 puffs into the lungs every 6 (six) hours as needed for wheezing or shortness of breath., Disp: 1 Inhaler, Rfl: 2 .  albuterol (PROVENTIL) (2.5 MG/3ML) 0.083% nebulizer solution, Take 3 mLs (2.5 mg total) by nebulization every 6 (six) hours as needed for wheezing or shortness of breath., Disp: 75 mL, Rfl: 2 .  ALPRAZolam (XANAX) 0.25 MG tablet, Take 1 tablet (0.25 mg total) by mouth 2 (two) times daily as needed for anxiety., Disp: 60 tablet, Rfl: 0 .  Ascorbic Acid (VITAMIN C PO), Take by mouth., Disp: , Rfl:  .  budesonide-formoterol (SYMBICORT) 160-4.5 MCG/ACT inhaler, Inhale 2 puffs into the lungs 2 (two) times daily., Disp: , Rfl:  .  Cholecalciferol (VITAMIN D-1000 MAX ST) 1000 UNITS tablet, Take 1 tablet by mouth 2 (two) times daily., Disp: , Rfl:  .  Cyanocobalamin (B-12 PO), Take 1 tablet by mouth daily. , Disp: , Rfl:  .  cyclobenzaprine (FLEXERIL) 10 MG tablet, TAKE 1 TABLET BY MOUTH THREE TIMES A DAY IF NEEDED FOR MUSCLE SPASM, Disp: 30 tablet, Rfl: 0 .  estradiol (ESTRACE) 0.5 MG tablet, take 1 tablet by mouth once daily, Disp: 90  tablet, Rfl: 4 .  fentaNYL (DURAGESIC - DOSED MCG/HR) 50 MCG/HR, Place 1 patch (50 mcg total) onto the skin every 3 (three) days., Disp: 10 patch, Rfl: 0 .  ferrous sulfate (IRON SUPPLEMENT) 325 (65 FE) MG tablet, Take 1 tablet by mouth daily., Disp: , Rfl:  .  fluticasone (FLONASE) 50 MCG/ACT nasal spray, Place 2 sprays into both nostrils as needed., Disp: 16 g, Rfl: 5 .  gabapentin (NEURONTIN) 300 MG capsule, Take 1-3 capsules (300-900 mg total) by mouth 3 (three) times daily. 300 mg twice a day and 900 mg at night, Disp: 540 capsule, Rfl: 1 .  ipratropium (ATROVENT HFA) 17 MCG/ACT inhaler, Inhale 2 puffs into the lungs every 6 (six) hours., Disp: , Rfl:  .  Magnesium Oxide 500 MG CAPS, Take 500 mg by mouth 2 (two) times daily. , Disp: , Rfl:  .  Multiple Vitamins-Minerals (MULTIVITAMIN PO), Take 1 tablet by mouth daily. , Disp: , Rfl:  .  Omega-3 Fatty Acids (FISH OIL PO), Take by mouth., Disp: , Rfl:  .  ondansetron (ZOFRAN) 8 MG tablet, Take 1 tablet (8 mg total) by mouth 2 (two) times daily as needed for refractory nausea / vomiting., Disp: 60 tablet, Rfl: 2 .  oxyCODONE-acetaminophen (PERCOCET) 10-325 MG tablet, Take 1 tablet by mouth every 8 (eight) hours as needed for pain., Disp: 90 tablet, Rfl: 0 .  potassium gluconate 595 MG TABS tablet, Take 595 mg by mouth daily., Disp: , Rfl:  .  prochlorperazine (COMPAZINE) 10 MG tablet, Take 1 tablet (10 mg total) by mouth every 6 (six) hours as needed (Nausea or vomiting)., Disp: 60 tablet, Rfl: 2 .  SUMAtriptan (IMITREX) 100 MG tablet, take 1 tablet by mouth if needed for migraines as directed, Disp: 27 tablet, Rfl: 1 .  zolpidem (AMBIEN CR) 6.25 MG CR tablet, Take 1 tablet (6.25 mg total) by mouth at bedtime., Disp: 90 tablet, Rfl: 0 .  FLUoxetine (PROZAC) 40 MG capsule, Take 1 capsule (40 mg total) by mouth daily., Disp: 90 capsule, Rfl: 0 .  predniSONE (DELTASONE) 5 MG tablet, Take 1 tablet (5 mg total) by mouth daily with breakfast. (Patient  not taking: Reported on 07/28/2017), Disp: 10 tablet, Rfl: 0 No current facility-administered medications for this visit.   Facility-Administered  Medications Ordered in Other Visits:  .  0.9 %  sodium chloride infusion, , Intravenous, Once, Finnegan, Kathlene November, MD .  0.9 %  sodium chloride infusion, , Intravenous, Once, Grayland Ormond, Kathlene November, MD .  dexamethasone (DECADRON) 20 mg in sodium chloride 0.9 % 50 mL IVPB, 20 mg, Intravenous, Once, Grayland Ormond, Kathlene November, MD .  dexamethasone (DECADRON) injection 10 mg, 10 mg, Intravenous, Once, Finnegan, Kathlene November, MD .  sodium chloride flush (NS) 0.9 % injection 10 mL, 10 mL, Intravenous, PRN, Lloyd Huger, MD, 10 mL at 06/16/17 0854  No Known Allergies   ROS  Constitutional: Negative for fever or weight change.  Respiratory: Negative for cough and shortness of breath.   Cardiovascular: Negative for chest pain or palpitations.  Gastrointestinal: Negative for abdominal pain, no bowel changes.  Musculoskeletal: Negative for gait problem or joint swelling.  Skin: Negative for rash.  Neurological: Negative for dizziness or headache.  No other specific complaints in a complete review of systems (except as listed in HPI above).  Objective  Vitals:   07/28/17 1420  BP: 130/80  Pulse: 97  Resp: 16  SpO2: 99%  Weight: 157 lb 14.4 oz (71.6 kg)  Height: '5\' 3"'  (1.6 m)    Body mass index is 27.97 kg/m.  Physical Exam  Constitutional: Patient appears well-developed and well-nourished. Obese  No distress.  HEENT: head atraumatic, normocephalic, pupils equal and reactive to light,  neck supple, throat within normal limits Cardiovascular: Normal rate, regular rhythm and normal heart sounds.  No murmur heard. No BLE edema. Pulmonary/Chest: Effort normal and breath sounds normal. No respiratory distress. Abdominal: Soft.  There is no tenderness. Psychiatric: Patient has a normal mood and affect. behavior is normal. Judgment and thought content  normal.    Recent Results (from the past 2160 hour(s))  CA 125     Status: None   Collection Time: 05/19/17  8:25 AM  Result Value Ref Range   Cancer Antigen (CA) 125 38.0 0.0 - 38.1 U/mL    Comment: (NOTE) Roche Diagnostics Electrochemiluminescence Immunoassay (ECLIA) Values obtained with different assay methods or kits cannot be used interchangeably.  Results cannot be interpreted as absolute evidence of the presence or absence of malignant disease. Performed At: [facility] [place], [place] [id number] Rush Farmer MD [id number] Performed at [facility], [address] [place], [place] [address]   Comprehensive metabolic panel     Status: Abnormal   Collection Time: 05/19/17  8:25 AM  Result Value Ref Range   Sodium 137 135 - 145 mmol/L   Potassium 3.5 3.5 - 5.1 mmol/L   Chloride 101 101 - 111 mmol/L   CO2 25 22 - 32 mmol/L   Glucose, Bld 150 (H) 65 - 99 mg/dL   BUN 10 6 - 20 mg/dL   Creatinine, Ser 0.86 0.44 - 1.00 mg/dL   Calcium 8.6 (L) 8.9 - 10.3 mg/dL   Total Protein 6.7 6.5 - 8.1 g/dL   Albumin 3.6 3.5 - 5.0 g/dL   AST 30 15 - 41 U/L   ALT 14 14 - 54 U/L   Alkaline Phosphatase 99 38 - 126 U/L   Total Bilirubin 0.2 (L) 0.3 - 1.2 mg/dL   GFR calc non Af Amer >60 >60 mL/min   GFR calc Af Amer >60 >60 mL/min    Comment: (NOTE) The eGFR has been calculated using the CKD EPI equation. This calculation has not been validated in all clinical situations. eGFR's persistently <  60 mL/min signify possible Chronic Kidney Disease.    Anion gap 11 5 - 15    Comment: Performed at [facility], [address] [place], [place] [address]  CBC with Differential     Status: Abnormal   Collection Time: 05/19/17  8:25 AM  Result Value Ref Range   WBC 3.7 3.6 - 11.0 K/uL   RBC 2.48 (L) 3.80 - 5.20 MIL/uL   Hemoglobin 9.5 (L) 12.0 - 16.0 g/dL   HCT 26.1 (L) 35.0 - 47.0 %   MCV 105.3 (H) 80.0 - 100.0 fL   MCH 38.1 (H) 26.0 - 34.0 pg    MCHC 36.2 (H) 32.0 - 36.0 g/dL   RDW 15.7 (H) 11.5 - 14.5 %   Platelets 96 (L) 150 - 440 K/uL   Neutrophils Relative % 35 %   Neutro Abs 1.3 (L) 1.4 - 6.5 K/uL   Lymphocytes Relative 49 %   Lymphs Abs 1.8 1.0 - 3.6 K/uL   Monocytes Relative 14 %   Monocytes Absolute 0.5 0.2 - 0.9 K/uL   Eosinophils Relative 1 %   Eosinophils Absolute 0.0 0 - 0.7 K/uL   Basophils Relative 1 %   Basophils Absolute 0.0 0 - 0.1 K/uL    Comment: Performed at [facility], [place], [place] [address]  CA 125     Status: None   Collection Time: 06/09/17  8:26 AM  Result Value Ref Range   Cancer Antigen (CA) 125 31.7 0.0 - 38.1 U/mL    Comment: (NOTE) Roche Diagnostics Electrochemiluminescence Immunoassay (ECLIA) Values obtained with different assay methods or kits cannot be used interchangeably.  Results cannot be interpreted as absolute evidence of the presence or absence of malignant disease. Performed At: [facility] [place], [place] [id number] Rush Farmer MD [id number] Performed at [facility], [address] [place], [place] [address]   Comprehensive metabolic panel     Status: Abnormal   Collection Time: 06/09/17  8:26 AM  Result Value Ref Range   Sodium 136 135 - 145 mmol/L   Potassium 3.3 (L) 3.5 - 5.1 mmol/L   Chloride 100 (L) 101 - 111 mmol/L   CO2 29 22 - 32 mmol/L   Glucose, Bld 133 (H) 65 - 99 mg/dL   BUN 18 6 - 20 mg/dL   Creatinine, Ser 0.89 0.44 - 1.00 mg/dL   Calcium 8.5 (L) 8.9 - 10.3 mg/dL   Total Protein 6.2 (L) 6.5 - 8.1 g/dL   Albumin 3.2 (L) 3.5 - 5.0 g/dL   AST 27 15 - 41 U/L   ALT 15 14 - 54 U/L   Alkaline Phosphatase 69 38 - 126 U/L   Total Bilirubin 0.7 0.3 - 1.2 mg/dL   GFR calc non Af Amer >60 >60 mL/min   GFR calc Af Amer >60 >60 mL/min    Comment: (NOTE) The eGFR has been calculated using the CKD EPI equation. This calculation has not been validated in all clinical situations. eGFR's persistently  <60 mL/min signify possible Chronic Kidney Disease.    Anion gap 7 5 - 15    Comment: Performed at [facility], [address] [place], [place] [address]  CBC with Differential     Status: Abnormal   Collection Time: 06/09/17  8:26 AM  Result Value Ref Range   WBC 2.7 (L) 3.6 - 11.0 K/uL   RBC 1.65 (L) 3.80 - 5.20 MIL/uL   Hemoglobin 6.3 (L) 12.0 -  16.0 g/dL    Comment: RESULT REPEATED AND VERIFIED CRITICAL RESULT CALLED TO, READ BACK BY AND VERIFIED WITH: DR. FINNEGAN @ 8:41AM 06/09/2017 L[facility]    HCT 17.4 (L) 35.0 - 47.0 %   MCV 105.4 (H) 80.0 - 100.0 fL   MCH 38.5 (H) 26.0 - 34.0 pg   MCHC 36.5 (H) 32.0 - 36.0 g/dL   RDW 13.7 11.5 - 14.5 %   Platelets 9 (LL) 150 - 400 K/uL    Comment: RESULT REPEATED AND VERIFIED PLATELET COUNT CONFIRMED BY SMEAR CRITICAL RESULT CALLED TO, READ BACK BY AND VERIFIED WITH: DR. FINNEGAN @ 8:41 AM 06/09/2017 L[facility]    Neutrophils Relative % 42 %   Neutro Abs 1.1 (L) 1.4 - 6.5 K/uL   Lymphocytes Relative 50 %   Lymphs Abs 1.4 1.0 - 3.6 K/uL   Monocytes Relative 6 %   Monocytes Absolute 0.2 0.2 - 0.9 K/uL   Eosinophils Relative 1 %   Eosinophils Absolute 0.0 0 - 0.7 K/uL   Basophils Relative 1 %   Basophils Absolute 0.0 0 - 0.1 K/uL    Comment: Performed at [facility], [address] [place], [place] [address]  Prepare RBC     Status: None   Collection Time: 06/09/17  8:55 AM  Result Value Ref Range   Order Confirmation      ORDER PROCESSED BY BLOOD BANK Performed at [facility], [address], [place], [place] [address]   Prepare Pheresed Platelets     Status: None   Collection Time: 06/09/17  9:30 AM  Result Value Ref Range   Unit Number [id number]    Blood Component Type PLTP LR1 PAS    Unit division 00    Status of Unit ISSUED,FINAL    Transfusion Status      OK TO TRANSFUSE Performed at [facility], [address], [place], [place] [address]    Unit Number [id number]    Blood Component  Type PLTP LI1 PAS    Unit division 00    Status of Unit ISSUED,FINAL    Transfusion Status OK TO TRANSFUSE   BPAM Platelet Pheresis     Status: None   Collection Time: 06/09/17  9:30 AM  Result Value Ref Range   ISSUE DATE / TIME [id number]    Blood Product Unit Number [id number]    PRODUCT CODE E7001V00    Unit Type and Rh 6200    Blood Product Expiration Date 762263335456    ISSUE DATE / TIME [id number]    Blood Product Unit Number [id number]    PRODUCT CODE E7006V00    Unit Type and Rh 5100    Blood Product Expiration Date 597416384536   Type and screen     Status: None   Collection Time: 06/09/17  9:44 AM  Result Value Ref Range   ABO/RH(D) B POS    Antibody Screen NEG    Sample Expiration 06/12/2017    Unit Number [id number]    Blood Component Type RED CELLS,LR    Unit division 00    Status of Unit ISSUED,FINAL    Transfusion Status OK TO TRANSFUSE    Crossmatch Result      Compatible Performed at [facility], [address] [place], [place] [address]   BPAM RBC     Status: None   Collection Time: 06/09/17  9:44 AM  Result Value Ref Range   ISSUE DATE / TIME [id number]    Blood Product Unit Number  [id number]    PRODUCT CODE [id number]    Unit Type and Rh 7300    Blood Product Expiration Date 169450388828   ABO/Rh     Status: None   Collection Time: 06/09/17  9:48 AM  Result Value Ref Range   ABO/RH(D)      B POS Performed at [facility], [place], [place] [address]   CA 125     Status: None   Collection Time: 06/16/17  8:54 AM  Result Value Ref Range   Cancer Antigen (CA) 125 33.4 0.0 - 38.1 U/mL    Comment: (NOTE) Roche Diagnostics Electrochemiluminescence Immunoassay (ECLIA) Values obtained with different assay methods or kits cannot be used interchangeably.  Results cannot be interpreted as absolute evidence of the presence or absence of malignant disease. Performed At: [facility] [place], [place] [id number] Rush Farmer MD [id number] Performed at [facility], [address] [place], [place] [address]   Comprehensive metabolic panel     Status: Abnormal   Collection Time: 06/16/17  8:54 AM  Result Value Ref Range   Sodium 135 135 - 145 mmol/L   Potassium 3.6 3.5 - 5.1 mmol/L   Chloride 99 (L) 101 - 111 mmol/L   CO2 26 22 - 32 mmol/L   Glucose, Bld 127 (H) 65 - 99 mg/dL   BUN 17 6 - 20 mg/dL   Creatinine, Ser 0.80 0.44 - 1.00 mg/dL   Calcium 8.9 8.9 - 10.3 mg/dL   Total Protein 6.9 6.5 - 8.1 g/dL   Albumin 3.9 3.5 - 5.0 g/dL   AST 27 15 - 41 U/L   ALT 17 14 - 54 U/L   Alkaline Phosphatase 83 38 - 126 U/L   Total Bilirubin 0.6 0.3 - 1.2 mg/dL   GFR calc non Af Amer >60 >60 mL/min   GFR calc Af Amer >60 >60 mL/min    Comment: (NOTE) The eGFR has been calculated using the CKD EPI equation. This calculation has not been validated in all clinical situations. eGFR's persistently <60 mL/min signify possible Chronic Kidney Disease.    Anion gap 10 5 - 15    Comment: Performed at [facility], [address] [place], [place] [address]  CBC with Differential     Status: Abnormal   Collection Time: 06/16/17  8:54 AM  Result Value Ref Range   WBC 3.2 (L) 3.6 - 11.0 K/uL   RBC 2.23 (L) 3.80 - 5.20 MIL/uL   Hemoglobin 8.3 (L) 12.0 - 16.0 g/dL   HCT 22.6 (L) 35.0 - 47.0 %   MCV 101.4 (H) 80.0 - 100.0 fL   MCH 37.3 (H) 26.0 - 34.0 pg   MCHC 36.8 (H) 32.0 - 36.0 g/dL   RDW 16.4 (H) 11.5 - 14.5 %   Platelets 68 (L) 150 - 440 K/uL   Neutrophils Relative % 32 %   Neutro Abs 1.0 (L) 1.4 - 6.5 K/uL   Lymphocytes Relative 58 %   Lymphs Abs 1.8 1.0 - 3.6 K/uL   Monocytes Relative 7 %   Monocytes Absolute 0.2 0.2 - 0.9 K/uL   Eosinophils Relative 3 %   Eosinophils Absolute 0.1 0 - 0.7 K/uL   Basophils Relative 0 %   Basophils Absolute 0.0 0 - 0.1 K/uL    Comment: Performed at [facility], [address], [place], [place]  [address]  Comprehensive metabolic panel     Status: Abnormal   Collection Time:  06/19/17  8:34 AM  Result Value Ref Range   Sodium 135 135 - 145 mmol/L   Potassium 3.8 3.5 - 5.1 mmol/L   Chloride 98 (L) 101 - 111 mmol/L   CO2 27 22 - 32 mmol/L   Glucose, Bld 99 65 - 99 mg/dL   BUN 18 6 - 20 mg/dL   Creatinine, Ser 0.75 0.44 - 1.00 mg/dL   Calcium 9.0 8.9 - 10.3 mg/dL   Total Protein 7.0 6.5 - 8.1 g/dL   Albumin 4.0 3.5 - 5.0 g/dL   AST 28 15 - 41 U/L   ALT 19 14 - 54 U/L   Alkaline Phosphatase 96 38 - 126 U/L   Total Bilirubin 0.5 0.3 - 1.2 mg/dL   GFR calc non Af Amer >60 >60 mL/min   GFR calc Af Amer >60 >60 mL/min    Comment: (NOTE) The eGFR has been calculated using the CKD EPI equation. This calculation has not been validated in all clinical situations. eGFR's persistently <60 mL/min signify possible Chronic Kidney Disease.    Anion gap 10 5 - 15    Comment: Performed at [facility], [address] [place], [place] [address]  CBC with Differential     Status: Abnormal   Collection Time: 06/19/17  8:34 AM  Result Value Ref Range   WBC 2.7 (L) 3.6 - 11.0 K/uL   RBC 2.27 (L) 3.80 - 5.20 MIL/uL   Hemoglobin 8.5 (L) 12.0 - 16.0 g/dL   HCT 23.4 (L) 35.0 - 47.0 %   MCV 103.1 (H) 80.0 - 100.0 fL   MCH 37.3 (H) 26.0 - 34.0 pg   MCHC 36.2 (H) 32.0 - 36.0 g/dL   RDW 17.6 (H) 11.5 - 14.5 %   Platelets 129 (L) 150 - 440 K/uL   Neutrophils Relative % 24 %   Neutro Abs 0.6 (L) 1.4 - 6.5 K/uL   Lymphocytes Relative 61 %   Lymphs Abs 1.7 1.0 - 3.6 K/uL   Monocytes Relative 12 %   Monocytes Absolute 0.3 0.2 - 0.9 K/uL   Eosinophils Relative 2 %   Eosinophils Absolute 0.0 0 - 0.7 K/uL   Basophils Relative 1 %   Basophils Absolute 0.0 0 - 0.1 K/uL    Comment: Performed at [facility], [place], [place] [address]  CA 125     Status: Abnormal   Collection Time: 06/19/17  8:34 AM  Result Value Ref Range   Cancer Antigen (CA) 125 38.9 (H) 0.0 - 38.1  U/mL    Comment: (NOTE) Roche [employer]ostics Electrochemiluminescence Immunoassay (ECLIA) Values obtained with different assay methods or kits cannot be used interchangeably.  Results cannot be interpreted as absolute evidence of the presence or absence of malignant disease. Performed At: [facility] [place], [place] [id number] Rush Farmer MD [id number] Performed at [facility], [address] [place], [place] [address]   CA 125     Status: Abnormal   Collection Time: 06/23/17  8:51 AM  Result Value Ref Range   Cancer Antigen (CA) 125 49.7 (H) 0.0 - 38.1 U/mL    Comment: (NOTE) Roche [employer]ostics Electrochemiluminescence Immunoassay (ECLIA) Values obtained with different assay methods or kits cannot be used interchangeably.  Results cannot be interpreted as absolute evidence of the presence or absence of malignant disease. Performed At: [facility] [place], [place] [id number] Rush Farmer MD RJ:[id number] Performed at [facility], [address] [place],  [place] [address]   Comprehensive metabolic panel     Status: Abnormal   Collection Time: 06/23/17  8:51 AM  Result Value Ref Range   Sodium 138 135 - 145 mmol/L   Potassium 3.6 3.5 - 5.1 mmol/L   Chloride 100 (L) 101 - 111 mmol/L   CO2 28 22 - 32 mmol/L   Glucose, Bld 130 (H) 65 - 99 mg/dL   BUN 11 6 - 20 mg/dL   Creatinine, Ser 0.87 0.44 - 1.00 mg/dL   Calcium 8.7 (L) 8.9 - 10.3 mg/dL   Total Protein 6.4 (L) 6.5 - 8.1 g/dL   Albumin 3.6 3.5 -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

## 2017-07-29 LAB — HEMOGLOBIN A1C
EAG (MMOL/L): 5.5 (calc)
Hgb A1c MFr Bld: 5.1 % of total Hgb (ref <5.7)
MEAN PLASMA GLUCOSE: 100 (calc)

## 2017-07-29 LAB — VITAMIN B12: Vitamin B-12: >2000 pg/mL — ABNORMAL HIGH (ref 200–1100)

## 2017-07-29 LAB — VITAMIN D 25 HYDROXY (VIT D DEFICIENCY, FRACTURES): VIT D 25 HYDROXY: 46 ng/mL (ref 30–100)

## 2017-07-29 LAB — TSH: TSH: 3.51 m[IU]/L (ref 0.40–4.50)

## 2017-07-30 DIAGNOSIS — M546 Pain in thoracic spine: Secondary | ICD-10-CM | POA: Diagnosis not present

## 2017-07-30 DIAGNOSIS — M9902 Segmental and somatic dysfunction of thoracic region: Secondary | ICD-10-CM | POA: Diagnosis not present

## 2017-07-31 ENCOUNTER — Inpatient Hospital Stay: Payer: PPO

## 2017-07-31 DIAGNOSIS — C569 Malignant neoplasm of unspecified ovary: Secondary | ICD-10-CM

## 2017-07-31 DIAGNOSIS — Z5111 Encounter for antineoplastic chemotherapy: Secondary | ICD-10-CM | POA: Diagnosis not present

## 2017-07-31 DIAGNOSIS — C7960 Secondary malignant neoplasm of unspecified ovary: Secondary | ICD-10-CM

## 2017-07-31 LAB — CBC WITH DIFFERENTIAL/PLATELET
BASOS PCT: 1 %
Basophils Absolute: 0 10*3/uL (ref 0–0.1)
EOS PCT: 3 %
Eosinophils Absolute: 0.1 10*3/uL (ref 0–0.7)
HCT: 30.8 % — ABNORMAL LOW (ref 35.0–47.0)
Hemoglobin: 10.8 g/dL — ABNORMAL LOW (ref 12.0–16.0)
Lymphocytes Relative: 49 %
Lymphs Abs: 1.8 10*3/uL (ref 1.0–3.6)
MCH: 35 pg — ABNORMAL HIGH (ref 26.0–34.0)
MCHC: 35 g/dL (ref 32.0–36.0)
MCV: 100.2 fL — ABNORMAL HIGH (ref 80.0–100.0)
MONO ABS: 0.3 10*3/uL (ref 0.2–0.9)
MONOS PCT: 9 %
Neutro Abs: 1.4 10*3/uL (ref 1.4–6.5)
Neutrophils Relative %: 38 %
Platelets: 121 10*3/uL — ABNORMAL LOW (ref 150–440)
RBC: 3.07 MIL/uL — ABNORMAL LOW (ref 3.80–5.20)
RDW: 13.8 % (ref 11.5–14.5)
WBC: 3.6 10*3/uL (ref 3.6–11.0)

## 2017-07-31 LAB — COMPREHENSIVE METABOLIC PANEL
ALBUMIN: 3.9 g/dL (ref 3.5–5.0)
ALK PHOS: 91 U/L (ref 38–126)
ALT: 48 U/L (ref 14–54)
AST: 50 U/L — ABNORMAL HIGH (ref 15–41)
Anion gap: 10 (ref 5–15)
BUN: 14 mg/dL (ref 6–20)
CALCIUM: 8.9 mg/dL (ref 8.9–10.3)
CO2: 28 mmol/L (ref 22–32)
CREATININE: 0.7 mg/dL (ref 0.44–1.00)
Chloride: 97 mmol/L — ABNORMAL LOW (ref 101–111)
GFR calc non Af Amer: >60 mL/min (ref >60)
Glucose, Bld: 106 mg/dL — ABNORMAL HIGH (ref 65–99)
Potassium: 3.7 mmol/L (ref 3.5–5.1)
SODIUM: 135 mmol/L (ref 135–145)
Total Bilirubin: 0.7 mg/dL (ref 0.3–1.2)
Total Protein: 7 g/dL (ref 6.5–8.1)

## 2017-08-01 LAB — CA 125: CANCER ANTIGEN (CA) 125: 179.7 U/mL — AB (ref 0.0–38.1)

## 2017-08-05 ENCOUNTER — Other Ambulatory Visit: Payer: PPO

## 2017-08-05 ENCOUNTER — Ambulatory Visit: Payer: PPO | Admitting: Oncology

## 2017-08-05 ENCOUNTER — Other Ambulatory Visit: Payer: Self-pay

## 2017-08-05 NOTE — Patient Outreach (Signed)
[facility]) Care Management  08/05/2017  Jessica Burgess 1955/07/18 [id number]   Referral Date: 08/05/17 Referral Source: MD Referral Referral Reason: Assistance with medical bills   Outreach Attempt #1 Telephone call to patient for MD referral screening.  Spoke with patient.  She states she cannot talk right now. Advised patient to call CM back when available.  She verbalized understanding.  Plan: RN CM will send letter and attempt patient again within 4 business days.   Jessica Baseman, RN, MSN [facility] Care Management Care Management Coordinator Direct Line [phone] Toll Free: [phone]  Fax: [phone]

## 2017-08-06 ENCOUNTER — Other Ambulatory Visit: Payer: Self-pay

## 2017-08-06 NOTE — Patient Outreach (Signed)
[facility]) Care Management  08/06/2017  Jessica Burgess Jun 19, 1955 [id number]   Referral Date: 08/05/17 Referral Source: MD Referral Referral Reason: Assistance with medical bills   Incoming call from patient. She is able to verify HIPAA.  She reports that her POA Jessica Burgess is on the call as well.  Patient expresses some concern about mounting bills from her cancer treatment. Jessica Burgess reports that they were denied a write off and that they have been in contact with social worker at the cancer center but no assistance was available.  Patient looking for some type of assistance with her mounting medical bills.    Social: Patient lives with her friend Jessica Burgess.  Patient reports that she is independent with all aspects of care.    Conditions:Patient has ovarian cancer and is currently in treatment.  Patient also reports that she has some hyperlipidemia and history of back problems.  Medications: Patient takes several medications and supplements but voices no concerns with medications.   Appointments:  Patient sees primary care physician regularly and has transportation to appointments.   Advanced Directives: Patient has an advanced directive   Consent: Discussed with patient [facility] services.  Patient agreeable to social work for assistance with medical bills.    Plan: RN CM will refer to social work for assistance with medical bills.   RN CM will sign off case.     [name], RN, MSN [facility] Care Management Care Management Coordinator Direct Line [phone] Toll Free: [phone]  Fax: [phone]

## 2017-08-07 ENCOUNTER — Ambulatory Visit: Payer: Self-pay

## 2017-08-07 ENCOUNTER — Encounter: Payer: Self-pay | Admitting: Oncology

## 2017-08-08 ENCOUNTER — Other Ambulatory Visit: Payer: Self-pay | Admitting: *Deleted

## 2017-08-08 ENCOUNTER — Other Ambulatory Visit: Payer: Self-pay | Admitting: Oncology

## 2017-08-08 DIAGNOSIS — Z7189 Other specified counseling: Secondary | ICD-10-CM | POA: Insufficient documentation

## 2017-08-08 DIAGNOSIS — C569 Malignant neoplasm of unspecified ovary: Secondary | ICD-10-CM

## 2017-08-08 NOTE — Patient Outreach (Signed)
[facility]) Care Management  08/08/2017  Jessica Burgess 13-Feb-1956 [id number]   Patient referred to this social worker to assess for community resources to address her mounting medical bills from her cancer treatments. Per patient, her cancer is progressing. Patient states that she has a heart scan scheduled for 08/14/17. Her next chemotherapy treatment will not be until 08/18/17. Per patient, she has left messages with the social worker at the [facility], however they have not been able to connect to discuss available financial resources.  Patient will call again to leave a message.   Plan: This social worker will contact [employer] as well to discuss financial assistance options.   [name] [facility] Care Management [phone]

## 2017-08-08 NOTE — Progress Notes (Signed)
DISCONTINUE OFF PATHWAY REGIMEN - Ovarian   [id number]:Carboplatin AUC=6 + Paclitaxel 175 mg/m2 q21 Days:   A cycle is every 21 days:     Paclitaxel      Carboplatin   **Always confirm dose/schedule in your pharmacy ordering system**    REASON: Disease Progression PRIOR TREATMENT: Off Pathway: Carboplatin AUC=6 + Paclitaxel 175 mg/m2 q21 Days TREATMENT RESPONSE: Progressive Disease (PD)  START ON PATHWAY REGIMEN - Ovarian     A cycle is every 28 days:     Liposomal doxorubicin      Bevacizumab   **Always confirm dose/schedule in your pharmacy ordering system**    Patient Characteristics: Recurrent or Progressive Disease, Second Line Therapy, Platinum Resistant or < 6 Months Since Last Therapy Therapeutic Status: Recurrent or Progressive Disease AJCC T Category: TX AJCC N Category: NX AJCC M Category: M1a AJCC 8 Stage Grouping: IVA BRCA Mutation Status: Absent Line of Therapy: Second Line Progression on neoadjuvant therapy<= No Intent of Therapy: Non-Curative / Palliative Intent, Discussed with Patient

## 2017-08-13 ENCOUNTER — Other Ambulatory Visit: Payer: Self-pay | Admitting: *Deleted

## 2017-08-13 ENCOUNTER — Inpatient Hospital Stay: Payer: PPO | Admitting: Oncology

## 2017-08-13 ENCOUNTER — Inpatient Hospital Stay: Payer: PPO

## 2017-08-13 NOTE — Patient Outreach (Signed)
[facility]) Care Management  08/13/2017  Jessica Burgess 08-05-1955 [id number]   Return phone call to patient with an update on resources received from Cancer center social worker Elease Etienne. Elease Etienne provided this social worker with the phone number to medication management for possible assistance [phone]. However patient states that she needs assistance with purchasing her pain medications and sleep aid which medication assistance programs do not cover. He also suggested that patient contact the patient financial services office through the hospital to to explore any type of financial assistance programs that patient  may qualify for. Per patient, she will contact financial services and discuss her financial concerns regarding her medical bills. The cancer center social worker also gave the option for  patient contact him directly to set up an appointment to discuss further any additional financial needs, specifically with monthly expenses.   Per patient, she has a roommate that is very supportive and they split the household bills in half. Per patient, her primary concerns are her medical bills and her pain and sleep medications. She will contact patient financial services to explore any opportunities for financial assistance. Patient verbalized having no additional social work needs at this time.  This social workers phone number provided if there are any future community resource concerns.   [name] [facility] Care Management [phone]

## 2017-08-14 ENCOUNTER — Encounter
Admission: RE | Admit: 2017-08-14 | Discharge: 2017-08-14 | Disposition: A | Discharge status: Home / Self Care | Payer: PPO | Source: Ambulatory Visit | Attending: Oncology | Admitting: Oncology

## 2017-08-14 DIAGNOSIS — C569 Malignant neoplasm of unspecified ovary: Secondary | ICD-10-CM | POA: Diagnosis not present

## 2017-08-14 DIAGNOSIS — Z5111 Encounter for antineoplastic chemotherapy: Secondary | ICD-10-CM | POA: Diagnosis not present

## 2017-08-14 MED ORDER — TECHNETIUM TC 99M-LABELED RED BLOOD CELLS IV KIT
24.6130 | PACK | Freq: Once | INTRAVENOUS | Status: AC | PRN
  Administered 2017-08-14: 24.613 via INTRAVENOUS

## 2017-08-15 ENCOUNTER — Other Ambulatory Visit: Payer: Self-pay | Admitting: Oncology

## 2017-08-16 ENCOUNTER — Other Ambulatory Visit: Payer: Self-pay | Admitting: Oncology

## 2017-08-16 NOTE — Progress Notes (Signed)
[place]  [phone] Fax:(336) [phone]  ID: Jessica Burgess OB: 02-May-1955  MR#: [id number]  QJJ#:[id number]  Patient Care Team: Steele Sizer, MD as PCP - General (Family Medicine) Lloyd Huger, MD as Consulting Physician (Oncology) Mellody Drown, MD as Consulting Physician (Obstetrics and Gynecology)  CHIEF COMPLAINT: Stage IIIc high-grade serous ovarian carcinoma  INTERVAL HISTORY: Patient returns to clinic today for further evaluation and initiation of cycle 1, day 1 of Doxil and Avastin.  Her peripheral neuropathy seems to be getting worse.  She otherwise feels well. She has no other neurologic complaints.  She denies any chest pain, shortness of breath, or cough.  She has a good appetite and denies any nausea, vomiting, constipation, or diarrhea.  She denies any abdominal pain or bloating.  She has no urinary complaints.  Patient offers no further specific complaints today.  REVIEW OF SYSTEMS:   Review of Systems  Constitutional: Negative.  Negative for fever, malaise/fatigue and weight loss.  Respiratory: Negative.  Negative for cough and shortness of breath.   Cardiovascular: Negative.  Negative for chest pain and leg swelling.  Gastrointestinal: Negative.  Negative for abdominal pain, blood in stool, constipation, diarrhea, melena, nausea and vomiting.  Genitourinary: Negative.   Musculoskeletal: Negative.  Negative for back pain, joint pain and neck pain.  Skin: Negative.  Negative for rash.  Neurological: Positive for tingling and sensory change. Negative for focal weakness and weakness.  Psychiatric/Behavioral: The patient is nervous/anxious.     As per HPI. Otherwise, a complete review of systems is negative.  PAST MEDICAL HISTORY: Past Medical History:  Diagnosis Date  . Allergic rhinitis, cause unspecified   . Anxiety state, unspecified   . Arthritis   . Asthma    only when sick   . Backache, unspecified   .  Bronchitis    hx of when get sick  . Cancer ([place])    skin cancer , basal cell   . Cancer ([place]) 11/2016   ovarian  . Cervicalgia   . Complication of anesthesia   . Dermatophytosis of nail   . Dysmetabolic syndrome X   . Encounter for long-term (current) use of other medications   . Esophageal reflux   . Insomnia, unspecified   . Leukocytosis, unspecified   . Migraine without aura, without mention of intractable migraine without mention of status migrainosus   . Other and unspecified hyperlipidemia   . Other malaise and fatigue   . Overweight(278.02)   . Personal history of chemotherapy now   ovarian  . PONV (postoperative nausea and vomiting)   . Spinal stenosis in cervical region   . Symptomatic menopausal or female climacteric states   . Unspecified disorder of skin and subcutaneous tissue   . Unspecified vitamin D deficiency     PAST SURGICAL HISTORY: Past Surgical History:  Procedure Laterality Date  . ABDOMINAL HYSTERECTOMY    . ANTERIOR CERVICAL DECOMP/DISCECTOMY FUSION N/A 06/07/2015   Procedure: Cervical three - four and Cervical six- seven anterior cervical decompression with fusion interbody prosthesis plating and bonegraft;  Surgeon: Newman Pies, MD;  Location: [facility];  Service: Neurosurgery;  Laterality: N/A;  C34 and C67 anterior cervical decompression with fusion interbody prosthesis plating and bonegraft  . BACK SURGERY     x2 Lower   . EVACUATION OF CERVICAL HEMATOMA N/A 06/14/2015   Procedure: EVACUATION OF CERVICAL HEMATOMA;  Surgeon: Newman Pies, MD;  Location: [facility];  Service: Neurosurgery;  Laterality: N/A;  . NECK  SURGERY     x3  . TONSILLECTOMY      FAMILY HISTORY: Family History  Problem Relation Age of Onset  . Depression Mother   . Migraines Mother   . Dementia Father   . Diabetes Father   . Hyperlipidemia Father   . Hyperlipidemia Brother   . Hyperlipidemia Brother   . Breast cancer Paternal Aunt        34s     ADVANCED DIRECTIVES (Y/N):  N  HEALTH MAINTENANCE: Social History   Tobacco Use  . Smoking status: Former Smoker    Packs/day: 1.50    Years: 20.00    Pack years: 30.00    Types: Cigarettes    Start date: 03/19/1979    Last attempt to quit: 08/25/1999    Years since quitting: 18.0  . Smokeless tobacco: Never Used  . Tobacco comment: smoking cessation materials not required  Substance Use Topics  . Alcohol use: Not Currently    Alcohol/week: 0.0 oz  . Drug use: No     Colonoscopy:  PAP:  Bone density:  Lipid panel:  No Known Allergies  Current Outpatient Medications  Medication Sig Dispense Refill  . albuterol (PROVENTIL HFA;VENTOLIN HFA) 108 (90 Base) MCG/ACT inhaler Inhale 2 puffs into the lungs every 6 (six) hours as needed for wheezing or shortness of breath. 1 Inhaler 2  . albuterol (PROVENTIL) (2.5 MG/3ML) 0.083% nebulizer solution Take 3 mLs (2.5 mg total) by nebulization every 6 (six) hours as needed for wheezing or shortness of breath. 75 mL 2  . Ascorbic Acid (VITAMIN C PO) Take by mouth.    . budesonide-formoterol (SYMBICORT) 160-4.5 MCG/ACT inhaler Inhale 2 puffs into the lungs 2 (two) times daily.    . Cholecalciferol (VITAMIN D-1000 MAX ST) 1000 UNITS tablet Take 1 tablet by mouth 2 (two) times daily.    . Cyanocobalamin (B-12 PO) Take 1 tablet by mouth daily.     Marland Kitchen estradiol (ESTRACE) 0.5 MG tablet take 1 tablet by mouth once daily 90 tablet 4  . ferrous sulfate (IRON SUPPLEMENT) 325 (65 FE) MG tablet Take 1 tablet by mouth daily.    Marland Kitchen FLUoxetine (PROZAC) 40 MG capsule Take 1 capsule (40 mg total) by mouth daily. 90 capsule 0  . fluticasone (FLONASE) 50 MCG/ACT nasal spray Place 2 sprays into both nostrils as needed. 16 g 5  . ipratropium (ATROVENT HFA) 17 MCG/ACT inhaler Inhale 2 puffs into the lungs every 6 (six) hours.    . Magnesium Oxide 500 MG CAPS Take 500 mg by mouth 2 (two) times daily.     . Multiple Vitamins-Minerals (MULTIVITAMIN PO) Take 1  tablet by mouth daily.     . Omega-3 Fatty Acids (FISH OIL PO) Take by mouth.    . oxyCODONE-acetaminophen (PERCOCET) 10-325 MG tablet Take 1 tablet by mouth every 8 (eight) hours as needed for pain. 90 tablet 0  . potassium gluconate 595 MG TABS tablet Take 595 mg by mouth daily.    . predniSONE (DELTASONE) 5 MG tablet Take 1 tablet (5 mg total) by mouth daily with breakfast. 10 tablet 0  . zolpidem (AMBIEN CR) 6.25 MG CR tablet Take 1 tablet (6.25 mg total) by mouth at bedtime. 90 tablet 0  . ALPRAZolam (XANAX) 0.25 MG tablet Take 1 tablet (0.25 mg total) by mouth 2 (two) times daily as needed for anxiety. (Patient not taking: Reported on 08/18/2017) 60 tablet 0  . cyclobenzaprine (FLEXERIL) 10 MG tablet TAKE 1 TABLET BY MOUTH THREE  TIMES A DAY IF NEEDED FOR MUSCLE SPASM (Patient not taking: Reported on 08/18/2017) 30 tablet 0  . fentaNYL (DURAGESIC - DOSED MCG/HR) 50 MCG/HR Place 1 patch (50 mcg total) onto the skin every 3 (three) days. 10 patch 0  . gabapentin (NEURONTIN) 300 MG capsule Take 1-3 capsules (300-900 mg total) by mouth 3 (three) times daily. 300 mg twice a day and 900 mg at night 540 capsule 1  . SUMAtriptan (IMITREX) 100 MG tablet TAKE 1 TABLET BY MOUTH IF NEEDED FOR MIGRAINES AS DIRECTED 27 tablet 0   No current facility-administered medications for this visit.    Facility-Administered Medications Ordered in Other Visits  Medication Dose Route Frequency Provider Last Rate Last Dose  . 0.9 %  sodium chloride infusion   Intravenous Once Lloyd Huger, MD      . 0.9 %  sodium chloride infusion   Intravenous Once Lloyd Huger, MD      . dexamethasone (DECADRON) 20 mg in sodium chloride 0.9 % 50 mL IVPB  20 mg Intravenous Once Lloyd Huger, MD      . dexamethasone (DECADRON) injection 10 mg  10 mg Intravenous Once Lloyd Huger, MD      . sodium chloride flush (NS) 0.9 % injection 10 mL  10 mL Intravenous PRN Lloyd Huger, MD   10 mL at 06/16/17 0854     OBJECTIVE: Vitals:   08/18/17 0904  BP: 132/75  Pulse: 73  Resp: 18  Temp: (!) 96.2 F (35.7 C)     Body mass index is 28.09 kg/m.    ECOG FS:0 - Asymptomatic  General: Well-developed, well-nourished, no acute distress. Eyes: Pink conjunctiva, anicteric sclera. Lungs: Clear to auscultation bilaterally. Heart: Regular rate and rhythm. No rubs, murmurs, or gallops. Abdomen: Soft, nontender, nondistended. No organomegaly noted, normoactive bowel sounds. Musculoskeletal: No edema, cyanosis, or clubbing. Neuro: Alert, answering all questions appropriately. Cranial nerves grossly intact. Skin: No rashes or petechiae noted. Psych: Normal affect.  LAB RESULTS:  Lab Results  Component Value Date   NA 137 08/18/2017   K 3.7 08/18/2017   CL 100 (L) 08/18/2017   CO2 27 08/18/2017   GLUCOSE 104 (H) 08/18/2017   BUN 16 08/18/2017   CREATININE 0.82 08/18/2017   CALCIUM 9.0 08/18/2017   PROT 7.1 08/18/2017   ALBUMIN 3.9 08/18/2017   AST 32 08/18/2017   ALT 21 08/18/2017   ALKPHOS 70 08/18/2017   BILITOT 0.5 08/18/2017   GFRNONAA >60 08/18/2017   GFRAA >60 08/18/2017    Lab Results  Component Value Date   WBC 3.0 (L) 08/18/2017   NEUTROABS 1.3 (L) 08/18/2017   HGB 10.1 (L) 08/18/2017   HCT 28.1 (L) 08/18/2017   MCV 101.1 (H) 08/18/2017   PLT 200 08/18/2017     STUDIES: Nm Cardiac Muga Rest  Result Date: 08/15/2017 CLINICAL DATA:  Malignant neoplasm of the ovary. Status post chemotherapy. EXAM: NUCLEAR MEDICINE CARDIAC BLOOD POOL IMAGING (MUGA) TECHNIQUE: Cardiac multi-gated acquisition was performed at rest following intravenous injection of Tc-85m labeled red blood cells. RADIOPHARMACEUTICALS:  24.61 mCi Tc-75m pertechnetate in-vitro labeled red blood cells IV COMPARISON:  06/19/2017 FINDINGS: Normal left ventricular wall motion. No akinetic or dyskinetic wall segments. The left ventricular ejection fraction was calculated to equal 78.8%. IMPRESSION: 1. Normal left  ventricular wall motion. 2. The left ventricular ejection fraction is equal to 78.8% Electronically Signed   By: Kerby Moors M.D.   On: 08/15/2017 14:37    ASSESSMENT:  Stage IIIc high-grade serous ovarian carcinoma  PLAN:  1. Stage IIIc high-grade serous ovarian carcinoma:  She underwent debulking surgery at [facility] on November 19, 2016. She was also initiated on a clinical trial and has received preoperative infusion of Keytruda on November 11, 2016.  Because of patient's persistent peripheral neuropathy as well as pancytopenia, Taxol was discontinued on April 28, 2017.  CT scan from June 19, 2017 is essentially unchanged from previous scan in January.  Unfortunately, Ca-125 continues to trend up and is now 179.7.  Patient had a pretreatment MUGA on Aug 14, 2017 which revealed an EF 78%.  Proceed with cycle 1, day 1 of Doxil and Avastin today.  Return to clinic in 2 weeks for further evaluation and Avastin only and then in 4 weeks for further evaluation and consideration of cycle 2, day 1 of both Doxil and Avastin.       2. Pain: Continue  50 mcg fentanyl patch and oxycodone as needed.  Previously discussed with patient about referring back to PCP for pain management at conclusion of therapy.   3. Anxiety: Continue Xanax as prescribed. 4.  Peripheral neuropathy: Increase gabapentin to 600 mg 3 times daily.  Will consider neurology referral in the future.   5.  Anemia: Patient's hemoglobin is stable, but essentially unchanged.  Monitor. 6.  Leukopenia: Mild, monitor.  Approximately 30 minutes was spent in discussion of which greater than 50% was consultation.  Patient expressed understanding and was in agreement with this plan. She also understands that She can call clinic at any time with any questions, concerns, or complaints.   Cancer Staging Ovarian cancer, unspecified laterality ([place]) Staging form: Ovary, Fallopian Tube, and Primary Peritoneal Carcinoma, AJCC 8th Edition -  Clinical: No stage assigned - Unsigned   Lloyd Huger, MD   08/20/2017 5:32 PM

## 2017-08-18 ENCOUNTER — Other Ambulatory Visit: Payer: Self-pay | Admitting: *Deleted

## 2017-08-18 ENCOUNTER — Other Ambulatory Visit: Payer: Self-pay | Admitting: Family Medicine

## 2017-08-18 ENCOUNTER — Inpatient Hospital Stay: Payer: PPO

## 2017-08-18 ENCOUNTER — Inpatient Hospital Stay: Payer: PPO | Attending: Oncology

## 2017-08-18 ENCOUNTER — Other Ambulatory Visit: Payer: Self-pay

## 2017-08-18 ENCOUNTER — Inpatient Hospital Stay (HOSPITAL_BASED_OUTPATIENT_CLINIC_OR_DEPARTMENT_OTHER): Payer: PPO | Admitting: Oncology

## 2017-08-18 VITALS — BP 132/75 | HR 73 | Temp 96.2°F | Resp 18 | Wt 158.6 lb

## 2017-08-18 DIAGNOSIS — G43009 Migraine without aura, not intractable, without status migrainosus: Secondary | ICD-10-CM

## 2017-08-18 DIAGNOSIS — Z5112 Encounter for antineoplastic immunotherapy: Secondary | ICD-10-CM | POA: Insufficient documentation

## 2017-08-18 DIAGNOSIS — D649 Anemia, unspecified: Secondary | ICD-10-CM | POA: Insufficient documentation

## 2017-08-18 DIAGNOSIS — M5441 Lumbago with sciatica, right side: Secondary | ICD-10-CM

## 2017-08-18 DIAGNOSIS — F419 Anxiety disorder, unspecified: Secondary | ICD-10-CM

## 2017-08-18 DIAGNOSIS — G8929 Other chronic pain: Secondary | ICD-10-CM

## 2017-08-18 DIAGNOSIS — C569 Malignant neoplasm of unspecified ovary: Secondary | ICD-10-CM

## 2017-08-18 DIAGNOSIS — D72819 Decreased white blood cell count, unspecified: Secondary | ICD-10-CM

## 2017-08-18 DIAGNOSIS — Z5111 Encounter for antineoplastic chemotherapy: Secondary | ICD-10-CM | POA: Diagnosis not present

## 2017-08-18 DIAGNOSIS — T451X5A Adverse effect of antineoplastic and immunosuppressive drugs, initial encounter: Secondary | ICD-10-CM

## 2017-08-18 DIAGNOSIS — M542 Cervicalgia: Secondary | ICD-10-CM

## 2017-08-18 DIAGNOSIS — Z79899 Other long term (current) drug therapy: Secondary | ICD-10-CM | POA: Insufficient documentation

## 2017-08-18 DIAGNOSIS — G620 Drug-induced polyneuropathy: Secondary | ICD-10-CM

## 2017-08-18 DIAGNOSIS — Z87891 Personal history of nicotine dependence: Secondary | ICD-10-CM

## 2017-08-18 DIAGNOSIS — R52 Pain, unspecified: Secondary | ICD-10-CM | POA: Insufficient documentation

## 2017-08-18 LAB — CBC WITH DIFFERENTIAL/PLATELET
BASOS PCT: 1 %
Basophils Absolute: 0 10*3/uL (ref 0–0.1)
EOS ABS: 0.1 10*3/uL (ref 0–0.7)
Eosinophils Relative: 2 %
HCT: 28.1 % — ABNORMAL LOW (ref 35.0–47.0)
Hemoglobin: 10.1 g/dL — ABNORMAL LOW (ref 12.0–16.0)
Lymphocytes Relative: 43 %
Lymphs Abs: 1.3 10*3/uL (ref 1.0–3.6)
MCH: 36.5 pg — ABNORMAL HIGH (ref 26.0–34.0)
MCHC: 36.1 g/dL — AB (ref 32.0–36.0)
MCV: 101.1 fL — ABNORMAL HIGH (ref 80.0–100.0)
MONOS PCT: 12 %
Monocytes Absolute: 0.4 10*3/uL (ref 0.2–0.9)
Neutro Abs: 1.3 10*3/uL — ABNORMAL LOW (ref 1.4–6.5)
Neutrophils Relative %: 42 %
Platelets: 200 10*3/uL (ref 150–440)
RBC: 2.77 MIL/uL — ABNORMAL LOW (ref 3.80–5.20)
RDW: 16 % — ABNORMAL HIGH (ref 11.5–14.5)
WBC: 3 10*3/uL — ABNORMAL LOW (ref 3.6–11.0)

## 2017-08-18 LAB — COMPREHENSIVE METABOLIC PANEL
ALBUMIN: 3.9 g/dL (ref 3.5–5.0)
ALT: 21 U/L (ref 14–54)
ANION GAP: 10 (ref 5–15)
AST: 32 U/L (ref 15–41)
Alkaline Phosphatase: 70 U/L (ref 38–126)
BILIRUBIN TOTAL: 0.5 mg/dL (ref 0.3–1.2)
BUN: 16 mg/dL (ref 6–20)
CALCIUM: 9 mg/dL (ref 8.9–10.3)
CO2: 27 mmol/L (ref 22–32)
Chloride: 100 mmol/L — ABNORMAL LOW (ref 101–111)
Creatinine, Ser: 0.82 mg/dL (ref 0.44–1.00)
GFR calc non Af Amer: >60 mL/min (ref >60)
GLUCOSE: 104 mg/dL — AB (ref 65–99)
Potassium: 3.7 mmol/L (ref 3.5–5.1)
SODIUM: 137 mmol/L (ref 135–145)
TOTAL PROTEIN: 7.1 g/dL (ref 6.5–8.1)

## 2017-08-18 LAB — PROTEIN, URINE, RANDOM

## 2017-08-18 MED ORDER — ONDANSETRON HCL 40 MG/20ML IJ SOLN: Freq: Once | INTRAMUSCULAR | Status: DC

## 2017-08-18 MED ORDER — HEPARIN SOD (PORK) LOCK FLUSH 100 UNIT/ML IV SOLN
500.0000 [IU] | Freq: Once | INTRAVENOUS | Status: AC
  Administered 2017-08-18: 500 [IU] via INTRAVENOUS
  Filled 2017-08-18: qty 5

## 2017-08-18 MED ORDER — SODIUM CHLORIDE 0.9 % IV SOLN: 10.0000 mg | Freq: Once | INTRAVENOUS | Status: DC

## 2017-08-18 MED ORDER — DEXTROSE 5 % IV SOLN
70.0000 mg | Freq: Once | INTRAVENOUS | Status: AC
  Administered 2017-08-18: 70 mg via INTRAVENOUS
  Filled 2017-08-18: qty 10

## 2017-08-18 MED ORDER — DEXAMETHASONE SODIUM PHOSPHATE 10 MG/ML IJ SOLN
10.0000 mg | Freq: Once | INTRAMUSCULAR | Status: AC
  Administered 2017-08-18: 10 mg via INTRAVENOUS
  Filled 2017-08-18: qty 1

## 2017-08-18 MED ORDER — DEXTROSE 5 % IV SOLN
Freq: Once | INTRAVENOUS | Status: AC
  Administered 2017-08-18: 11:00:00 via INTRAVENOUS
  Filled 2017-08-18: qty 1000

## 2017-08-18 MED ORDER — FENTANYL 50 MCG/HR TD PT72: 50.0000 ug | MEDICATED_PATCH | TRANSDERMAL | 0 refills | Status: DC

## 2017-08-18 MED ORDER — ONDANSETRON HCL 4 MG/2ML IJ SOLN
8.0000 mg | Freq: Once | INTRAMUSCULAR | Status: AC
  Administered 2017-08-18: 8 mg via INTRAVENOUS
  Filled 2017-08-18: qty 4

## 2017-08-18 MED ORDER — SODIUM CHLORIDE 0.9% FLUSH
10.0000 mL | INTRAVENOUS | Status: DC | PRN
  Administered 2017-08-18: 10 mL via INTRAVENOUS
  Filled 2017-08-18: qty 10

## 2017-08-18 MED ORDER — SODIUM CHLORIDE 0.9 % IV SOLN
700.0000 mg | Freq: Once | INTRAVENOUS | Status: AC
  Administered 2017-08-18: 700 mg via INTRAVENOUS
  Filled 2017-08-18: qty 16

## 2017-08-18 MED ORDER — GABAPENTIN 300 MG PO CAPS: 300.0000 mg | ORAL_CAPSULE | Freq: Three times a day (TID) | ORAL | 1 refills | Status: DC

## 2017-08-18 NOTE — Progress Notes (Signed)
Doxil infusing. Patient began feeling nauseated. Doxil stopped and order for Zofran 8mg  IV obtained. Nausea resolved, Doxil restarted without any difficulties.

## 2017-08-18 NOTE — Progress Notes (Signed)
Here for follow up. Stated neuropathy pain in bilateral hands is "bad " feet also . Needs refill of meds- see pended meds.

## 2017-08-20 ENCOUNTER — Other Ambulatory Visit: Payer: Self-pay

## 2017-08-20 DIAGNOSIS — G43009 Migraine without aura, not intractable, without status migrainosus: Secondary | ICD-10-CM

## 2017-08-20 DIAGNOSIS — M542 Cervicalgia: Secondary | ICD-10-CM

## 2017-08-20 DIAGNOSIS — M5441 Lumbago with sciatica, right side: Secondary | ICD-10-CM

## 2017-08-20 DIAGNOSIS — T451X5A Adverse effect of antineoplastic and immunosuppressive drugs, initial encounter: Secondary | ICD-10-CM

## 2017-08-20 DIAGNOSIS — G620 Drug-induced polyneuropathy: Secondary | ICD-10-CM

## 2017-08-20 DIAGNOSIS — G8929 Other chronic pain: Secondary | ICD-10-CM

## 2017-08-20 MED ORDER — GABAPENTIN 300 MG PO CAPS: 300.0000 mg | ORAL_CAPSULE | Freq: Three times a day (TID) | ORAL | 1 refills | Status: DC

## 2017-08-20 NOTE — Telephone Encounter (Signed)
Patient states she was informed to increase her dosage. Need new Rx for insurance purposes at [facility].

## 2017-08-25 ENCOUNTER — Inpatient Hospital Stay: Payer: PPO

## 2017-08-25 ENCOUNTER — Other Ambulatory Visit: Payer: Self-pay | Admitting: Family Medicine

## 2017-08-25 DIAGNOSIS — Z5112 Encounter for antineoplastic immunotherapy: Secondary | ICD-10-CM | POA: Diagnosis not present

## 2017-08-25 DIAGNOSIS — M5441 Lumbago with sciatica, right side: Secondary | ICD-10-CM

## 2017-08-25 DIAGNOSIS — M542 Cervicalgia: Secondary | ICD-10-CM

## 2017-08-25 DIAGNOSIS — G620 Drug-induced polyneuropathy: Secondary | ICD-10-CM

## 2017-08-25 DIAGNOSIS — G8929 Other chronic pain: Secondary | ICD-10-CM

## 2017-08-25 DIAGNOSIS — T451X5A Adverse effect of antineoplastic and immunosuppressive drugs, initial encounter: Secondary | ICD-10-CM

## 2017-08-25 DIAGNOSIS — G43009 Migraine without aura, not intractable, without status migrainosus: Secondary | ICD-10-CM

## 2017-08-25 DIAGNOSIS — C569 Malignant neoplasm of unspecified ovary: Secondary | ICD-10-CM

## 2017-08-25 LAB — COMPREHENSIVE METABOLIC PANEL
ALT: 16 U/L (ref 14–54)
AST: 23 U/L (ref 15–41)
Albumin: 4 g/dL (ref 3.5–5.0)
Alkaline Phosphatase: 74 U/L (ref 38–126)
Anion gap: 9 (ref 5–15)
BILIRUBIN TOTAL: 0.6 mg/dL (ref 0.3–1.2)
BUN: 13 mg/dL (ref 6–20)
CO2: 28 mmol/L (ref 22–32)
Calcium: 9.1 mg/dL (ref 8.9–10.3)
Chloride: 99 mmol/L — ABNORMAL LOW (ref 101–111)
Creatinine, Ser: 0.69 mg/dL (ref 0.44–1.00)
GFR calc Af Amer: >60 mL/min (ref >60)
Glucose, Bld: 120 mg/dL — ABNORMAL HIGH (ref 65–99)
POTASSIUM: 4.3 mmol/L (ref 3.5–5.1)
Sodium: 136 mmol/L (ref 135–145)
TOTAL PROTEIN: 7 g/dL (ref 6.5–8.1)

## 2017-08-25 LAB — CBC WITH DIFFERENTIAL/PLATELET
Basophils Absolute: 0 10*3/uL (ref 0–0.1)
Basophils Relative: 1 %
EOS ABS: 0.1 10*3/uL (ref 0–0.7)
EOS PCT: 2 %
HCT: 32.3 % — ABNORMAL LOW (ref 35.0–47.0)
Hemoglobin: 11.4 g/dL — ABNORMAL LOW (ref 12.0–16.0)
LYMPHS PCT: 50 %
Lymphs Abs: 1.4 10*3/uL (ref 1.0–3.6)
MCH: 35.7 pg — ABNORMAL HIGH (ref 26.0–34.0)
MCHC: 35.3 g/dL (ref 32.0–36.0)
MCV: 101 fL — AB (ref 80.0–100.0)
Monocytes Absolute: 0.3 10*3/uL (ref 0.2–0.9)
Monocytes Relative: 10 %
Neutro Abs: 1 10*3/uL — ABNORMAL LOW (ref 1.4–6.5)
Neutrophils Relative %: 37 %
PLATELETS: 292 10*3/uL (ref 150–440)
RBC: 3.19 MIL/uL — AB (ref 3.80–5.20)
RDW: 16.1 % — AB (ref 11.5–14.5)
WBC: 2.8 10*3/uL — AB (ref 3.6–11.0)

## 2017-08-25 MED ORDER — GABAPENTIN 300 MG PO CAPS: 300.0000 mg | ORAL_CAPSULE | Freq: Three times a day (TID) | ORAL | 1 refills | Status: DC

## 2017-08-31 NOTE — Progress Notes (Signed)
[place]  Telephone:(336) [phone] Fax:(336) [phone]  ID: Jessica Burgess OB: [birth year]  MR#: [id number]  WSF#:[id number]  Patient Care Team: Steele Sizer, MD as PCP - General (Family Medicine) Lloyd Huger, MD as Consulting Physician (Oncology) Mellody Drown, MD as Consulting Physician (Obstetrics and Gynecology)  CHIEF COMPLAINT: Stage IIIc high-grade serous ovarian carcinoma  INTERVAL HISTORY: Patient returns to clinic today for further evaluation and consideration of cycle 1, day 15 of Doxil and Avastin.  Avastin only today.  She tolerated her first treatment well without significant side effects.  She continues to have significant peripheral neuropathy.  She otherwise feels well. She has no other neurologic complaints.  She denies any chest pain, shortness of breath, or cough.  She has a good appetite and denies any nausea, vomiting, constipation, or diarrhea.  She denies any abdominal pain or bloating.  She has no urinary complaints.  Patient offers no further specific complaints today.  REVIEW OF SYSTEMS:   Review of Systems  Constitutional: Negative.  Negative for fever, malaise/fatigue and weight loss.  Respiratory: Negative.  Negative for cough and shortness of breath.   Cardiovascular: Negative.  Negative for chest pain and leg swelling.  Gastrointestinal: Negative.  Negative for abdominal pain, blood in stool, constipation, diarrhea, melena, nausea and vomiting.  Genitourinary: Negative.   Musculoskeletal: Negative.  Negative for back pain, joint pain and neck pain.  Skin: Negative.  Negative for rash.  Neurological: Positive for tingling and sensory change. Negative for focal weakness and weakness.  Psychiatric/Behavioral: The patient is nervous/anxious.     As per HPI. Otherwise, a complete review of systems is negative.  PAST MEDICAL HISTORY: Past Medical History:  Diagnosis Date  . Allergic rhinitis, cause unspecified   . Anxiety  state, unspecified   . Arthritis   . Asthma    only when sick   . Backache, unspecified   . Bronchitis    hx of when get sick  . Cancer ([place])    skin cancer , basal cell   . Cancer ([place]) 11/2016   ovarian  . Cervicalgia   . Complication of anesthesia   . Dermatophytosis of nail   . Dysmetabolic syndrome X   . Encounter for long-term (current) use of other medications   . Esophageal reflux   . Insomnia, unspecified   . Leukocytosis, unspecified   . Migraine without aura, without mention of intractable migraine without mention of status migrainosus   . Other and unspecified hyperlipidemia   . Other malaise and fatigue   . Overweight(278.02)   . Personal history of chemotherapy now   ovarian  . PONV (postoperative nausea and vomiting)   . Spinal stenosis in cervical region   . Symptomatic menopausal or female climacteric states   . Unspecified disorder of skin and subcutaneous tissue   . Unspecified vitamin D deficiency     PAST SURGICAL HISTORY: Past Surgical History:  Procedure Laterality Date  . ABDOMINAL HYSTERECTOMY    . ANTERIOR CERVICAL DECOMP/DISCECTOMY FUSION N/A 06/07/2015   Procedure: Cervical three - four and Cervical six- seven anterior cervical decompression with fusion interbody prosthesis plating and bonegraft;  Surgeon: Newman Pies, MD;  Location: [place] [facility];  Service: Neurosurgery;  Laterality: N/A;  C34 and C67 anterior cervical decompression with fusion interbody prosthesis plating and bonegraft  . BACK SURGERY     x2 Lower   . EVACUATION OF CERVICAL HEMATOMA N/A 06/14/2015   Procedure: EVACUATION OF CERVICAL HEMATOMA;  Surgeon: Newman Pies, MD;  Location: [place] [facility];  Service: Neurosurgery;  Laterality: N/A;  . NECK SURGERY     x3  . TONSILLECTOMY      FAMILY HISTORY: Family History  Problem Relation Age of Onset  . Depression Mother   . Migraines Mother   . Dementia Father   . Diabetes Father   . Hyperlipidemia Father   .  Hyperlipidemia Brother   . Hyperlipidemia Brother   . Breast cancer Paternal Aunt        35s    ADVANCED DIRECTIVES (Y/N):  N  HEALTH MAINTENANCE: Social History   Tobacco Use  . Smoking status: Former Smoker    Packs/day: 1.50    Years: 20.00    Pack years: 30.00    Types: Cigarettes    Start date: 03/19/1979    Last attempt to quit: 08/25/1999    Years since quitting: 18.0  . Smokeless tobacco: Never Used  . Tobacco comment: smoking cessation materials not required  Substance Use Topics  . Alcohol use: Not Currently    Alcohol/week: 0.0 oz  . Drug use: No     Colonoscopy:  PAP:  Bone density:  Lipid panel:  No Known Allergies  Current Outpatient Medications  Medication Sig Dispense Refill  . albuterol (PROVENTIL HFA;VENTOLIN HFA) 108 (90 Base) MCG/ACT inhaler Inhale 2 puffs into the lungs every 6 (six) hours as needed for wheezing or shortness of breath. 1 Inhaler 2  . albuterol (PROVENTIL) (2.5 MG/3ML) 0.083% nebulizer solution Take 3 mLs (2.5 mg total) by nebulization every 6 (six) hours as needed for wheezing or shortness of breath. 75 mL 2  . ALPRAZolam (XANAX) 0.25 MG tablet Take 1 tablet (0.25 mg total) by mouth 2 (two) times daily as needed for anxiety. 60 tablet 0  . Ascorbic Acid (VITAMIN C PO) Take by mouth.    . budesonide-formoterol (SYMBICORT) 160-4.5 MCG/ACT inhaler Inhale 2 puffs into the lungs 2 (two) times daily.    . Cholecalciferol (VITAMIN D-1000 MAX ST) 1000 UNITS tablet Take 1 tablet by mouth 2 (two) times daily.    . Cyanocobalamin (B-12 PO) Take 1 tablet by mouth daily.     . cyclobenzaprine (FLEXERIL) 10 MG tablet TAKE 1 TABLET BY MOUTH THREE TIMES A DAY IF NEEDED FOR MUSCLE SPASM 30 tablet 0  . estradiol (ESTRACE) 0.5 MG tablet take 1 tablet by mouth once daily 90 tablet 4  . fentaNYL (DURAGESIC - DOSED MCG/HR) 50 MCG/HR Place 1 patch (50 mcg total) onto the skin every 3 (three) days. 10 patch 0  . ferrous sulfate (IRON SUPPLEMENT) 325 (65 FE) MG  tablet Take 1 tablet by mouth daily.    Marland Kitchen FLUoxetine (PROZAC) 40 MG capsule Take 1 capsule (40 mg total) by mouth daily. 90 capsule 0  . fluticasone (FLONASE) 50 MCG/ACT nasal spray Place 2 sprays into both nostrils as needed. 16 g 5  . gabapentin (NEURONTIN) 300 MG capsule Take 1-3 capsules (300-900 mg total) by mouth 3 (three) times daily. 300 mg twice a day and 900 mg at night 540 capsule 1  . ipratropium (ATROVENT HFA) 17 MCG/ACT inhaler Inhale 2 puffs into the lungs every 6 (six) hours.    . Magnesium Oxide 500 MG CAPS Take 500 mg by mouth 2 (two) times daily.     . Multiple Vitamins-Minerals (MULTIVITAMIN PO) Take 1 tablet by mouth daily.     . Omega-3 Fatty Acids (FISH OIL PO) Take by mouth.    . potassium gluconate 595 MG TABS  tablet Take 595 mg by mouth daily.    . predniSONE (DELTASONE) 5 MG tablet Take 1 tablet (5 mg total) by mouth daily with breakfast. 10 tablet 0  . SUMAtriptan (IMITREX) 100 MG tablet TAKE 1 TABLET BY MOUTH IF NEEDED FOR MIGRAINES AS DIRECTED 27 tablet 0  . zolpidem (AMBIEN CR) 6.25 MG CR tablet Take 1 tablet (6.25 mg total) by mouth at bedtime. 90 tablet 0  . azithromycin (ZITHROMAX) 250 MG tablet 500 mg PO x1 on day 1, then 250 mg PO q24h x4 days 6 each 0  . magic mouthwash w/lidocaine SOLN Take 5 mLs by mouth 3 (three) times daily. 315 mL 0  . oxyCODONE-acetaminophen (PERCOCET) 10-325 MG tablet Take 1 tablet by mouth every 8 (eight) hours as needed for pain. 90 tablet 0  . valACYclovir (VALTREX) 1000 MG tablet Take 1 tablet (1,000 mg total) by mouth 2 (two) times daily for 10 days then take 1/2 tablet (500 mg total) by mouth 2 (two) times daily. 40 tablet 0   No current facility-administered medications for this visit.    Facility-Administered Medications Ordered in Other Visits  Medication Dose Route Frequency Provider Last Rate Last Dose  . 0.9 %  sodium chloride infusion   Intravenous Once Lloyd Huger, MD      . 0.9 %  sodium chloride infusion    Intravenous Once Lloyd Huger, MD      . dexamethasone (DECADRON) 20 mg in sodium chloride 0.9 % 50 mL IVPB  20 mg Intravenous Once Lloyd Huger, MD      . dexamethasone (DECADRON) injection 10 mg  10 mg Intravenous Once Lloyd Huger, MD      . sodium chloride flush (NS) 0.9 % injection 10 mL  10 mL Intravenous PRN Lloyd Huger, MD   10 mL at 06/16/17 0854    OBJECTIVE: Vitals:   09/01/17 1012  BP: 139/83  Pulse: 94  Resp: 18  Temp: (!) 96.2 F (35.7 C)     Body mass index is 29.09 kg/m.    ECOG FS:0 - Asymptomatic  General: Well-developed, well-nourished, no acute distress. Eyes: Pink conjunctiva, anicteric sclera. Lungs: Clear to auscultation bilaterally. Heart: Regular rate and rhythm. No rubs, murmurs, or gallops. Abdomen: Soft, nontender, nondistended. No organomegaly noted, normoactive bowel sounds. Musculoskeletal: No edema, cyanosis, or clubbing. Neuro: Alert, answering all questions appropriately. Cranial nerves grossly intact. Skin: No rashes or petechiae noted. Psych: Normal affect.  LAB RESULTS:  Lab Results  Component Value Date   NA 136 09/01/2017   K 3.7 09/01/2017   CL 101 09/01/2017   CO2 28 09/01/2017   GLUCOSE 140 (H) 09/01/2017   BUN 14 09/01/2017   CREATININE 0.80 09/01/2017   CALCIUM 9.1 09/01/2017   PROT 6.5 09/01/2017   ALBUMIN 3.5 09/01/2017   AST 24 09/01/2017   ALT 19 09/01/2017   ALKPHOS 73 09/01/2017   BILITOT 0.4 09/01/2017   GFRNONAA >60 09/01/2017   GFRAA >60 09/01/2017    Lab Results  Component Value Date   WBC 3.5 (L) 09/01/2017   NEUTROABS 1.9 09/01/2017   HGB 10.5 (L) 09/01/2017   HCT 29.1 (L) 09/01/2017   MCV 101.2 (H) 09/01/2017   PLT 238 09/01/2017     STUDIES: Nm Cardiac Muga Rest  Result Date: 08/15/2017 CLINICAL DATA:  Malignant neoplasm of the ovary. Status post chemotherapy. EXAM: NUCLEAR MEDICINE CARDIAC BLOOD POOL IMAGING (MUGA) TECHNIQUE: Cardiac multi-gated acquisition was  performed at rest following intravenous  injection of Tc-34m labeled red blood cells. RADIOPHARMACEUTICALS:  24.61 mCi Tc-3m pertechnetate in-vitro labeled red blood cells IV COMPARISON:  06/19/2017 FINDINGS: Normal left ventricular wall motion. No akinetic or dyskinetic wall segments. The left ventricular ejection fraction was calculated to equal 78.8%. IMPRESSION: 1. Normal left ventricular wall motion. 2. The left ventricular ejection fraction is equal to 78.8% Electronically Signed   By: Kerby Moors M.D.   On: 08/15/2017 14:37    ASSESSMENT: Stage IIIc high-grade serous ovarian carcinoma  PLAN:  1. Stage IIIc high-grade serous ovarian carcinoma:  She underwent debulking surgery at [facility] on November 19, 2016. She was also initiated on a clinical trial and has received preoperative infusion of Keytruda on November 11, 2016.  Because of patient's persistent peripheral neuropathy as well as pancytopenia, Taxol was discontinued on April 28, 2017.  CT scan from June 19, 2017 is essentially unchanged from previous scan in January.  Unfortunately, Ca-125 continued to trend up, but now has decreased to 148.7 after initiating treatment. Patient had a pretreatment MUGA on Aug 15, 2017 which revealed an EF 78%.  Proceed with cycle 1, day 15 of Doxil and Avastin today.  Avastin only.  Return to clinic in 2 weeks for further evaluation and consideration of cycle 2, day 1.   2. Pain: Continue  50 mcg fentanyl patch and oxycodone as needed.  Previously discussed with patient about referring back to PCP for pain management at conclusion of therapy.  Patient was given a prescription for her oxycodone today. 3. Anxiety: Continue Xanax as prescribed. 4.  Peripheral neuropathy: Increase gabapentin to 600 mg 3 times daily.  Will consider neurology referral in the future.   5.  Anemia: Patient's hemoglobin has trended down slightly, monitor. 6.  Leukopenia: Mild, monitor.  Patient does not require udencya at  this time.  Patient expressed understanding and was in agreement with this plan. She also understands that She can call clinic at any time with any questions, concerns, or complaints.   Cancer Staging Ovarian cancer, unspecified laterality ([place]) Staging form: Ovary, Fallopian Tube, and Primary Peritoneal Carcinoma, AJCC 8th Edition - Clinical: No stage assigned - Unsigned   Lloyd Huger, MD   09/05/2017 5:34 PM

## 2017-09-01 ENCOUNTER — Inpatient Hospital Stay: Payer: PPO

## 2017-09-01 ENCOUNTER — Inpatient Hospital Stay (HOSPITAL_BASED_OUTPATIENT_CLINIC_OR_DEPARTMENT_OTHER): Payer: PPO | Admitting: Oncology

## 2017-09-01 ENCOUNTER — Other Ambulatory Visit: Payer: Self-pay

## 2017-09-01 VITALS — BP 139/83 | HR 94 | Temp 96.2°F | Resp 18 | Wt 164.2 lb

## 2017-09-01 DIAGNOSIS — G8929 Other chronic pain: Secondary | ICD-10-CM

## 2017-09-01 DIAGNOSIS — G629 Polyneuropathy, unspecified: Secondary | ICD-10-CM | POA: Diagnosis not present

## 2017-09-01 DIAGNOSIS — C569 Malignant neoplasm of unspecified ovary: Secondary | ICD-10-CM

## 2017-09-01 DIAGNOSIS — F419 Anxiety disorder, unspecified: Secondary | ICD-10-CM

## 2017-09-01 DIAGNOSIS — F17210 Nicotine dependence, cigarettes, uncomplicated: Secondary | ICD-10-CM

## 2017-09-01 DIAGNOSIS — M542 Cervicalgia: Secondary | ICD-10-CM

## 2017-09-01 DIAGNOSIS — Z5112 Encounter for antineoplastic immunotherapy: Secondary | ICD-10-CM | POA: Diagnosis not present

## 2017-09-01 DIAGNOSIS — R52 Pain, unspecified: Secondary | ICD-10-CM

## 2017-09-01 DIAGNOSIS — M5441 Lumbago with sciatica, right side: Secondary | ICD-10-CM

## 2017-09-01 LAB — COMPREHENSIVE METABOLIC PANEL
ALBUMIN: 3.5 g/dL (ref 3.5–5.0)
ALT: 19 U/L (ref 14–54)
AST: 24 U/L (ref 15–41)
Alkaline Phosphatase: 73 U/L (ref 38–126)
Anion gap: 7 (ref 5–15)
BILIRUBIN TOTAL: 0.4 mg/dL (ref 0.3–1.2)
BUN: 14 mg/dL (ref 6–20)
CHLORIDE: 101 mmol/L (ref 101–111)
CO2: 28 mmol/L (ref 22–32)
Calcium: 9.1 mg/dL (ref 8.9–10.3)
Creatinine, Ser: 0.8 mg/dL (ref 0.44–1.00)
GFR calc Af Amer: >60 mL/min (ref >60)
GFR calc non Af Amer: >60 mL/min (ref >60)
GLUCOSE: 140 mg/dL — AB (ref 65–99)
POTASSIUM: 3.7 mmol/L (ref 3.5–5.1)
SODIUM: 136 mmol/L (ref 135–145)
TOTAL PROTEIN: 6.5 g/dL (ref 6.5–8.1)

## 2017-09-01 LAB — CBC WITH DIFFERENTIAL/PLATELET
BASOS ABS: 0 10*3/uL (ref 0–0.1)
Basophils Relative: 1 %
Eosinophils Absolute: 0 10*3/uL (ref 0–0.7)
Eosinophils Relative: 1 %
HEMATOCRIT: 29.1 % — AB (ref 35.0–47.0)
Hemoglobin: 10.5 g/dL — ABNORMAL LOW (ref 12.0–16.0)
LYMPHS PCT: 35 %
Lymphs Abs: 1.2 10*3/uL (ref 1.0–3.6)
MCH: 36.4 pg — ABNORMAL HIGH (ref 26.0–34.0)
MCHC: 35.9 g/dL (ref 32.0–36.0)
MCV: 101.2 fL — AB (ref 80.0–100.0)
MONO ABS: 0.3 10*3/uL (ref 0.2–0.9)
Monocytes Relative: 8 %
Neutro Abs: 1.9 10*3/uL (ref 1.4–6.5)
Neutrophils Relative %: 55 %
Platelets: 238 10*3/uL (ref 150–440)
RBC: 2.87 MIL/uL — AB (ref 3.80–5.20)
RDW: 15.7 % — AB (ref 11.5–14.5)
WBC: 3.5 10*3/uL — ABNORMAL LOW (ref 3.6–11.0)

## 2017-09-01 MED ORDER — SODIUM CHLORIDE 0.9 % IV SOLN
Freq: Once | INTRAVENOUS | Status: AC
  Administered 2017-09-01: 11:00:00 via INTRAVENOUS
  Filled 2017-09-01: qty 1000

## 2017-09-01 MED ORDER — SODIUM CHLORIDE 0.9% FLUSH
10.0000 mL | INTRAVENOUS | Status: DC | PRN
  Administered 2017-09-01: 10 mL via INTRAVENOUS
  Filled 2017-09-01: qty 10

## 2017-09-01 MED ORDER — OXYCODONE-ACETAMINOPHEN 10-325 MG PO TABS: 1.0000 | ORAL_TABLET | Freq: Three times a day (TID) | ORAL | 0 refills | Status: DC | PRN

## 2017-09-01 MED ORDER — SODIUM CHLORIDE 0.9 % IV SOLN
700.0000 mg | Freq: Once | INTRAVENOUS | Status: AC
  Administered 2017-09-01: 700 mg via INTRAVENOUS
  Filled 2017-09-01: qty 16

## 2017-09-01 MED ORDER — HEPARIN SOD (PORK) LOCK FLUSH 100 UNIT/ML IV SOLN
500.0000 [IU] | Freq: Once | INTRAVENOUS | Status: AC
  Administered 2017-09-01: 500 [IU] via INTRAVENOUS
  Filled 2017-09-01: qty 5

## 2017-09-01 NOTE — Progress Notes (Signed)
Here for follow up. Stated she is doing " decent , not great " still gardening and getting out and busy. Needs oxy renewal-pended

## 2017-09-02 ENCOUNTER — Encounter: Payer: Self-pay | Admitting: Nurse Practitioner

## 2017-09-02 ENCOUNTER — Inpatient Hospital Stay (HOSPITAL_BASED_OUTPATIENT_CLINIC_OR_DEPARTMENT_OTHER): Payer: PPO | Admitting: Nurse Practitioner

## 2017-09-02 VITALS — BP 151/95 | HR 82 | Temp 97.8°F | Resp 18 | Wt 161.0 lb

## 2017-09-02 DIAGNOSIS — J0110 Acute frontal sinusitis, unspecified: Secondary | ICD-10-CM

## 2017-09-02 DIAGNOSIS — C569 Malignant neoplasm of unspecified ovary: Secondary | ICD-10-CM | POA: Diagnosis not present

## 2017-09-02 DIAGNOSIS — K1231 Oral mucositis (ulcerative) due to antineoplastic therapy: Secondary | ICD-10-CM | POA: Insufficient documentation

## 2017-09-02 DIAGNOSIS — Z5112 Encounter for antineoplastic immunotherapy: Secondary | ICD-10-CM | POA: Diagnosis not present

## 2017-09-02 LAB — CA 125: CANCER ANTIGEN (CA) 125: 148.7 U/mL — AB (ref 0.0–38.1)

## 2017-09-02 MED ORDER — MAGIC MOUTHWASH W/LIDOCAINE: 5.0000 mL | Freq: Three times a day (TID) | ORAL | 0 refills | Status: DC

## 2017-09-02 MED ORDER — AZITHROMYCIN 250 MG PO TABS: ORAL_TABLET | ORAL | 0 refills | Status: DC

## 2017-09-02 MED ORDER — VALACYCLOVIR HCL 1 G PO TABS: ORAL_TABLET | ORAL | 0 refills | Status: DC

## 2017-09-02 NOTE — Patient Instructions (Addendum)
Hi Jessica Burgess,  I have sent prescriptions for the Valtrex, Azithromycin, and Magic Mouthwash to your pharmacy. Below are some details about oral care and mouth rinses with salt water and baking soda which may help. Please call the clinic at [phone] (option 3) if any questions or concerns. Please let me know if your symptoms don't improve over the next several days. Thank you for allowing me to participate in your care. -Beckey Rutter, NP  Oral Mucositis Oral mucositis is a mouth condition that may develop from treatments for cancer. With this condition, sores may appear on your lips, gums, tongue, throat, and the top (roof) or bottom (floor) of your mouth. What are the causes? Oral mucositis can happen to anyone who is being treated with cancer therapies, including:  Cancer medicines (chemotherapy).  Radiation therapy.  Bone marrow transplants and stem cell transplants.  Cancer treatments can damage the lining of the mouth, and that causes this condition. Oral mucositis is not caused by infection. However, the sores can become infected after they form. Infection can make oral mucositis worse. What increases the risk? This condition is more likely to develop in people:  With poor oral hygiene.  With dental problems or oral diseases.  Who use any tobacco product, including cigarettes, chewing tobacco, and electronic cigarettes.  Who drink alcohol.  Who have other medical conditions, such as diabetes, HIV, AIDS, or kidney disease.  Who do not drink enough clear fluids.  Who wear dentures that do not fit correctly.  Who have cancers that primarily affect the blood.  Who are female.  What are the signs or symptoms? Symptoms can vary from mild to severe. Symptoms are usually seen 7-10 days after cancer treatment has started. Symptoms include:  Mouth sores. These sores may bleed.  Color changes inside the mouth. Red, shiny areas may appear.  White patches or pus in the  mouth.  Pain in the mouth and throat. This can make it painful to speak.  Dryness and a burning feeling in the mouth.  Saliva that is dry and thick.  Trouble eating, drinking, and swallowing. This can lead to weight loss.  How is this diagnosed? This condition can be diagnosed with a physical exam. How is this treated? Treatment depends on the severity of the condition. Oral mucositis often heals on its own. Sometimes, changes in the cancer treatment can help. Treatment may include medicines, such as:  An antibiotic medicine to fight infection, if present.  Medicine to help the cells in your mouth heal more quickly.  Medicine may also be given to help control pain. This may include:  Pain relievers that are swished around in the mouth. These make the mouth numb to ease the pain (topical anesthetics).  Mouth rinses.  Prescribed, medicated gels. The gel coats the mouth. This protects nerve endings and lessens pain.  Narcotic pain medicines.  Follow these instructions at home: Medicines  Do not use products that contain benzocaine (including numbing gels) to treat teething or mouth pain in children who are younger than 2 years. These products may cause a rare but serious blood condition.  If you were prescribed an antibiotic medicine, finish all of it even if you start to feel better.  Take or apply medicines only as directed by your health care provider. Lifestyle  Keeping your mouth clean and germ-free is important. To maintain good oral hygiene: ? Brush your teeth carefully with a soft, nylon-bristled toothbrush at least two times each day. Use a gentle  toothpaste. Ask your health care provider for a toothpaste recommendation. ? Floss your teeth every day. ? Have your teeth cleaned regularly as recommended by your dentist. ? Rinse your mouth after every meal or as directed by your health care provider. Do not use mouthwash that contains alcohol. Ask your health care  provider for a mouthwash recommendation.  Do not use any tobacco products, including cigarettes, chewing tobacco, and electronic cigarettes. If you need help quitting, ask your health care provider.  Avoid eating: ? Hot and spicy foods. ? Citrus. ? Foods that have sharp edges, such as chips. ? Sugary foods, such as candy.  Do not drink alcohol. General instructions  Clean the sores as directed by your health care provider.  If your lips are dry or cracked, apply a water-based moisturizer to your lips as needed.  Try sucking on ice chips or sugar-free frozen pops. This may help with pain. This also keeps your mouth moist.  Drink enough fluid to keep your urine clear or pale yellow.  If you are losing weight, talk with your health care provider.  If you have dentures, take them out often as directed by your health care provider.  Keep all follow-up visits as directed by your health care provider. This is important. Contact a health care provider if:  You have mouth pain or throat pain.  You are having more trouble swallowing.  Your symptoms get worse.  You have new symptoms.  Your pain is not controlled with medicine. Get help right away if:  You have a lot of bleeding in your mouth.  You have trouble speaking.  You develop new, open, or draining sores in your mouth.  You cannot swallow solid food or liquids.  You have a fever. This information is not intended to replace advice given to you by your health care provider. Make sure you discuss any questions you have with your health care provider. Document Released: 10/19/2010 Document Revised: 08/09/2016 Document Reviewed: 02/28/2014 Elsevier Interactive Patient Education  [employer].

## 2017-09-02 NOTE — Progress Notes (Signed)
[facility]pt[facility] [place]  Telephone:([phone]) [phone] Fax:(336) [phone]  Patient Care Team: Steele Sizer, MD as PCP - General (Family Medicine) Lloyd Huger, MD as Consulting Physician (Oncology) Mellody Drown, MD as Consulting Physician (Obstetrics and Gynecology)   Name of the patient: Jessica Burgess  [id number]  01-30-1956   Date of visit: 09/02/17  Diagnosis- Stage IIIc high-grade serous ovarian carcinoma  Chief complaint/ Reason for visit- mouth sore  Heme/Onc history:    Ovarian cancer, unspecified laterality ([place])   11/12/2016 Initial Diagnosis    Ovarian cancer, unspecified laterality ([place])      08/08/2017 -  Chemotherapy    The patient had bevacizumab (AVASTIN) 700 mg in sodium chloride 0.9 % 100 mL chemo infusion, 725 mg, Intravenous,  Once, 1 of 6 cycles Administration: 700 mg (08/18/2017), 700 mg (09/01/2017) DOXOrubicin HCL LIPOSOMAL (DOXIL) 70 mg in dextrose 5 % 250 mL chemo infusion, 72 mg, Intravenous,  Once, 1 of 6 cycles Administration: 70 mg (08/18/2017)  for chemotherapy treatment.          Interval history- Jessica Burgess is a 62 y.o. female who complains of oral tenderness and esophageal pain with swallowing. Associated signs and symptoms include:sore on inside of lower lip and back of throat. Symptoms started 3-4 days ago and have rapidly worsened. The sore on her lip causes her significant pain and she is tearful from pain today. She has had poor oral intake. She has tried mouth rinses and oralgel without improvement. She is unaware of anything that makes symptoms better or worse and has not been evaluated for symptoms previously.   ECOG FS:0 - Asymptomatic  Review of systems- Review of Systems  Constitutional: Positive for malaise/fatigue. Negative for chills, fever and weight loss.  HENT: Positive for sore throat. Negative for congestion, ear discharge, ear pain, sinus pain and tinnitus.        Mouth  sores  Eyes: Negative.   Respiratory: Negative.  Negative for cough, sputum production and shortness of breath.   Cardiovascular: Negative for chest pain, palpitations, orthopnea, claudication and leg swelling.  Gastrointestinal: Negative for abdominal pain, blood in stool, constipation, diarrhea, heartburn, nausea and vomiting.  Genitourinary: Negative.   Musculoskeletal: Negative.   Skin: Negative.   Neurological: Negative for dizziness, tingling, weakness and headaches.  Endo/Heme/Allergies: Negative.   Psychiatric/Behavioral: Negative.      Current treatment- s/p bevacizumab-doxil on 08/18/17 and bevacizumab on 08/30/17  No Known Allergies   Past Medical History:  Diagnosis Date  . Allergic rhinitis, cause unspecified   . Anxiety state, unspecified   . Arthritis   . Asthma    only when sick   . Backache, unspecified   . Bronchitis    hx of when get sick  . Cancer ([place])    skin cancer , basal cell   . Cancer ([place]) 11/2016   ovarian  . Cervicalgia   . Complication of anesthesia   . Dermatophytosis of nail   . Dysmetabolic syndrome X   . Encounter for long-term (current) use of other medications   . Esophageal reflux   . Insomnia, unspecified   . Leukocytosis, unspecified   . Migraine without aura, without mention of intractable migraine without mention of status migrainosus   . Other and unspecified hyperlipidemia   . Other malaise and fatigue   . Overweight(278.02)   . Personal history of chemotherapy now   ovarian  . PONV (postoperative nausea and vomiting)   . Spinal stenosis in cervical  region   . Symptomatic menopausal or female climacteric states   . Unspecified disorder of skin and subcutaneous tissue   . Unspecified vitamin D deficiency      Past Surgical History:  Procedure Laterality Date  . ABDOMINAL HYSTERECTOMY    . ANTERIOR CERVICAL DECOMP/DISCECTOMY FUSION N/A 06/07/2015   Procedure: Cervical three - four and Cervical six- seven anterior cervical  decompression with fusion interbody prosthesis plating and bonegraft;  Surgeon: Newman Pies, MD;  Location: [facility];  Service: Neurosurgery;  Laterality: N/A;  C34 and C67 anterior cervical decompression with fusion interbody prosthesis plating and bonegraft  . BACK SURGERY     x2 Lower   . EVACUATION OF CERVICAL HEMATOMA N/A 06/14/2015   Procedure: EVACUATION OF CERVICAL HEMATOMA;  Surgeon: Newman Pies, MD;  Location: [facility];  Service: Neurosurgery;  Laterality: N/A;  . NECK SURGERY     x3  . TONSILLECTOMY      Social History   Socioeconomic History  . Marital status: Single    Spouse name: Not on file  . Number of children: 0  . Years of education: some college  . Highest education level: 12th grade  Occupational History    Employer: DISABLED  . Occupation: Disabled   Social Needs  . Financial resource strain: Very hard  . Food insecurity:    Worry: Never true    Inability: Never true  . Transportation needs:    Medical: No    Non-medical: No  Tobacco Use  . Smoking status: Former Smoker    Packs/day: 1.50    Years: 20.00    Pack years: 30.00    Types: Cigarettes    Start date: 03/19/1979    Last attempt to quit: 08/25/1999    Years since quitting: 18.0  . Smokeless tobacco: Never Used  . Tobacco comment: smoking cessation materials not required  Substance and Sexual Activity  . Alcohol use: Not Currently    Alcohol/week: 0.0 oz  . Drug use: No  . Sexual activity: Never  Lifestyle  . Physical activity:    Days per week: 0 days    Minutes per session: 0 min  . Stress: Very much  Relationships  . Social connections:    Talks on phone: Patient refused    Gets together: Patient refused    Attends religious service: Patient refused    Active member of club or organization: Patient refused    Attends meetings of clubs or organizations: Patient refused    Relationship status: Patient refused  . Intimate partner violence:    Fear of current or ex  partner: No    Emotionally abused: No    Physically abused: No    Forced sexual activity: No  Other Topics Concern  . Not on file  Social History Narrative   Patient is single.    Patient lives with roommates.    Patient on disability    Patient has no children.    Patient has some college     Family History  Problem Relation Age of Onset  . Depression Mother   . Migraines Mother   . Dementia Father   . Diabetes Father   . Hyperlipidemia Father   . Hyperlipidemia Brother   . Hyperlipidemia Brother   . Breast cancer Paternal Aunt        47s     Current Outpatient Medications:  .  albuterol (PROVENTIL HFA;VENTOLIN HFA) 108 (90 Base) MCG/ACT inhaler, Inhale 2 puffs into the lungs  every 6 (six) hours as needed for wheezing or shortness of breath., Disp: 1 Inhaler, Rfl: 2 .  albuterol (PROVENTIL) (2.5 MG/3ML) 0.083% nebulizer solution, Take 3 mLs (2.5 mg total) by nebulization every 6 (six) hours as needed for wheezing or shortness of breath., Disp: 75 mL, Rfl: 2 .  ALPRAZolam (XANAX) 0.25 MG tablet, Take 1 tablet (0.25 mg total) by mouth 2 (two) times daily as needed for anxiety., Disp: 60 tablet, Rfl: 0 .  Ascorbic Acid (VITAMIN C PO), Take by mouth., Disp: , Rfl:  .  budesonide-formoterol (SYMBICORT) 160-4.5 MCG/ACT inhaler, Inhale 2 puffs into the lungs 2 (two) times daily., Disp: , Rfl:  .  Cholecalciferol (VITAMIN D-1000 MAX ST) 1000 UNITS tablet, Take 1 tablet by mouth 2 (two) times daily., Disp: , Rfl:  .  Cyanocobalamin (B-12 PO), Take 1 tablet by mouth daily. , Disp: , Rfl:  .  cyclobenzaprine (FLEXERIL) 10 MG tablet, TAKE 1 TABLET BY MOUTH THREE TIMES A DAY IF NEEDED FOR MUSCLE SPASM, Disp: 30 tablet, Rfl: 0 .  estradiol (ESTRACE) 0.5 MG tablet, take 1 tablet by mouth once daily, Disp: 90 tablet, Rfl: 4 .  fentaNYL (DURAGESIC - DOSED MCG/HR) 50 MCG/HR, Place 1 patch (50 mcg total) onto the skin every 3 (three) days., Disp: 10 patch, Rfl: 0 .  ferrous sulfate (IRON  SUPPLEMENT) 325 (65 FE) MG tablet, Take 1 tablet by mouth daily., Disp: , Rfl:  .  FLUoxetine (PROZAC) 40 MG capsule, Take 1 capsule (40 mg total) by mouth daily., Disp: 90 capsule, Rfl: 0 .  fluticasone (FLONASE) 50 MCG/ACT nasal spray, Place 2 sprays into both nostrils as needed., Disp: 16 g, Rfl: 5 .  gabapentin (NEURONTIN) 300 MG capsule, Take 1-3 capsules (300-900 mg total) by mouth 3 (three) times daily. 300 mg twice a day and 900 mg at night, Disp: 540 capsule, Rfl: 1 .  ipratropium (ATROVENT HFA) 17 MCG/ACT inhaler, Inhale 2 puffs into the lungs every 6 (six) hours., Disp: , Rfl:  .  Magnesium Oxide 500 MG CAPS, Take 500 mg by mouth 2 (two) times daily. , Disp: , Rfl:  .  Multiple Vitamins-Minerals (MULTIVITAMIN PO), Take 1 tablet by mouth daily. , Disp: , Rfl:  .  Omega-3 Fatty Acids (FISH OIL PO), Take by mouth., Disp: , Rfl:  .  oxyCODONE-acetaminophen (PERCOCET) 10-325 MG tablet, Take 1 tablet by mouth every 8 (eight) hours as needed for pain., Disp: 90 tablet, Rfl: 0 .  potassium gluconate 595 MG TABS tablet, Take 595 mg by mouth daily., Disp: , Rfl:  .  predniSONE (DELTASONE) 5 MG tablet, Take 1 tablet (5 mg total) by mouth daily with breakfast., Disp: 10 tablet, Rfl: 0 .  SUMAtriptan (IMITREX) 100 MG tablet, TAKE 1 TABLET BY MOUTH IF NEEDED FOR MIGRAINES AS DIRECTED, Disp: 27 tablet, Rfl: 0 .  zolpidem (AMBIEN CR) 6.25 MG CR tablet, Take 1 tablet (6.25 mg total) by mouth at bedtime., Disp: 90 tablet, Rfl: 0 .  azithromycin (ZITHROMAX) 250 MG tablet, 500 mg PO x1 on day 1, then 250 mg PO q24h x4 days, Disp: 6 each, Rfl: 0 .  magic mouthwash w/lidocaine SOLN, Take 5 mLs by mouth 3 (three) times daily., Disp: 315 mL, Rfl: 0 .  valACYclovir (VALTREX) 1000 MG tablet, Take 1 tablet (1,000 mg total) by mouth 2 (two) times daily for 10 days then take 1/2 tablet (500 mg total) by mouth 2 (two) times daily., Disp: 40 tablet, Rfl: 0 No current facility-administered  medications for this visit.    Facility-Administered Medications Ordered in Other Visits:  .  0.9 %  sodium chloride infusion, , Intravenous, Once, Finnegan, Kathlene November, MD .  0.9 %  sodium chloride infusion, , Intravenous, Once, Grayland Ormond, Kathlene November, MD .  dexamethasone (DECADRON) 20 mg in sodium chloride 0.9 % 50 mL IVPB, 20 mg, Intravenous, Once, Grayland Ormond, Kathlene November, MD .  dexamethasone (DECADRON) injection 10 mg, 10 mg, Intravenous, Once, Finnegan, Kathlene November, MD .  sodium chloride flush (NS) 0.9 % injection 10 mL, 10 mL, Intravenous, PRN, Lloyd Huger, MD, 10 mL at 06/16/17 0854  Physical exam:  Vitals:   09/02/17 1451  BP: (!) 151/95  Pulse: 82  Resp: 18  Temp: 97.8 F (36.6 C)  TempSrc: Tympanic  Weight: 161 lb (73 kg)   Physical Exam  Constitutional: She is oriented to person, place, and time. She appears well-developed and well-nourished.  HENT:  Head: Atraumatic.  Right Ear: Hearing, tympanic membrane, external ear and ear canal normal.  Left Ear: Hearing and external ear normal. There is swelling. Tympanic membrane is erythematous. A middle ear effusion is present.  Nose: Nose normal.  Mouth/Throat: Oral lesions present. Posterior oropharyngeal erythema present.  Viral appearing lesion on lower lip; oropharyngeal lesions also observed  Eyes: Conjunctivae are normal. No scleral icterus.  Neck: Normal range of motion.  Cardiovascular: Normal rate, regular rhythm and normal heart sounds.  Pulmonary/Chest: Effort normal and breath sounds normal.  Abdominal: Soft. Bowel sounds are normal.  Musculoskeletal: She exhibits no edema.  Neurological: She is alert and oriented to person, place, and time.  Skin: Skin is warm and dry.  Psychiatric: She has a normal mood and affect.     CMP Latest Ref Rng & Units 09/01/2017  Glucose 65 - 99 mg/dL 140(H)  BUN 6 - 20 mg/dL 14  Creatinine 0.44 - 1.00 mg/dL 0.80  Sodium 135 - 145 mmol/L 136  Potassium 3.5 - 5.1 mmol/L 3.7  Chloride 101 - 111 mmol/L 101   CO2 22 - 32 mmol/L 28  Calcium 8.9 - 10.3 mg/dL 9.1  Total Protein 6.5 - 8.1 g/dL 6.5  Total Bilirubin 0.3 - 1.2 mg/dL 0.4  Alkaline Phos 38 - 126 U/L 73  AST 15 - 41 U/L 24  ALT 14 - 54 U/L 19   CBC Latest Ref Rng & Units 09/01/2017  WBC 3.6 - 11.0 K/uL 3.5(L)  Hemoglobin 12.0 - 16.0 g/dL 10.5(L)  Hematocrit 35.0 - 47.0 % 29.1(L)  Platelets 150 - 440 K/uL 238    No images are attached to the encounter.  Nm Cardiac Muga Rest  Result Date: 08/15/2017 CLINICAL DATA:  Malignant neoplasm of the ovary. Status post chemotherapy. EXAM: NUCLEAR MEDICINE CARDIAC BLOOD POOL IMAGING (MUGA) TECHNIQUE: Cardiac multi-gated acquisition was performed at rest following intravenous injection of Tc-6m labeled red blood cells. RADIOPHARMACEUTICALS:  24.61 mCi Tc-82m pertechnetate in-vitro labeled red blood cells IV COMPARISON:  06/19/2017 FINDINGS: Normal left ventricular wall motion. No akinetic or dyskinetic wall segments. The left ventricular ejection fraction was calculated to equal 78.8%. IMPRESSION: 1. Normal left ventricular wall motion. 2. The left ventricular ejection fraction is equal to 78.8% Electronically Signed   By: Kerby Moors M.D.   On: 08/15/2017 14:37     Assessment and plan- Patient is a 62 y.o. female with high grade serous ovarian cancer who presents to [facility] for complaints of mouth sores.   1. Stage IIIc high grade serous ovarian carcinoma- s/p debulking on  11/19/16 at [facility]. Initiated clinical trial and received preoperative keyruda on 11/11/16. Due to persistent peripheral neuropathy and pancytopenia, taxol was d/c on 04/28/17. CT 06/19/17 essentially same from 03/2017. CA-125 was trending up and Doxil- avastin was started. Tumor marker now trending down. Pretreatment MUGA revealed EF 78%.    2. Mucositis- likely associated with chemotherapy; appears viral. Start Magic Mouthwash and valtrex. Salt water and baking soda rinses at home. Use soft toothbrush for dental hygiene. Continue valtrex  prophylactic dose after resolution of current symptoms. Could consider folic acid and/or lysine for persistent symptoms and/or prophylaxis.   3. URI- sinusitis with ear fullness, popping, etc. Will start azithromycin for 5 days. She will complete treatment prior to next cycle of chemotherapy.    Rtc as scheduled for follow up with Dr. Grayland Ormond or sooner if symptoms worsen or do not improve over next 3-4 days.   Visit Diagnosis 1. 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[place])   2. Mucositis due to chemotherapy   3. Acute non-recurrent frontal sinusitis     Patient expressed understanding and was in agreement with this plan. She also understands that She can call clinic at any time with any questions, concerns, or complaints.    Beckey Rutter, DNP, AGNP-C [place] at [facility] [phone] (work cell) [phone] (office) 09/02/17 4:02 PM

## 2017-09-14 NOTE — Progress Notes (Signed)
[place]  [phone] [phone]  ID: Jessica Burgess OB: 1955-07-13  MR#: [id number]  HUD#:[id number]  Patient Care Team: Steele Sizer, MD as PCP - General (Family Medicine) Lloyd Huger, MD as Consulting Physician (Oncology) Mellody Drown, MD as Consulting Physician (Obstetrics and Gynecology)  CHIEF COMPLAINT: Stage IIIc high-grade serous ovarian carcinoma  INTERVAL HISTORY: Patient returns to clinic today for further evaluation and consideration of cycle 2, day 1 of Doxil and Avastin.  She noticed a total body rash that started 3 or 4 days ago that is pruritic.  She continues to have mild mouth soreness, but her herpes infection has nearly resolved with Valtrex.  She otherwise feels well. She continues to have significant peripheral neuropathy. She has no other neurologic complaints.  She denies any chest pain, shortness of breath, or cough.  She has a good appetite and denies any nausea, vomiting, constipation, or diarrhea.  She denies any abdominal pain or bloating.  She has no urinary complaints.  Patient offers no further specific complaints today.  REVIEW OF SYSTEMS:   Review of Systems  Constitutional: Negative.  Negative for fever, malaise/fatigue and weight loss.  HENT: Positive for sore throat.   Respiratory: Negative.  Negative for cough and shortness of breath.   Cardiovascular: Negative.  Negative for chest pain and leg swelling.  Gastrointestinal: Negative.  Negative for abdominal pain, blood in stool, constipation, diarrhea, melena, nausea and vomiting.  Genitourinary: Negative.  Negative for dysuria.  Musculoskeletal: Negative.  Negative for back pain, joint pain and neck pain.  Skin: Positive for itching and rash.  Neurological: Positive for tingling and sensory change. Negative for focal weakness and weakness.  Psychiatric/Behavioral: Negative.  The patient is not nervous/anxious.     As per HPI. Otherwise, a  complete review of systems is negative.  PAST MEDICAL HISTORY: Past Medical History:  Diagnosis Date  . Allergic rhinitis, cause unspecified   . Anxiety state, unspecified   . Arthritis   . Asthma    only when sick   . Backache, unspecified   . Bronchitis    hx of when get sick  . Cancer ([place])    skin cancer , basal cell   . Cancer ([place]) 11/2016   ovarian  . Cervicalgia   . Complication of anesthesia   . Dermatophytosis of nail   . Dysmetabolic syndrome X   . Encounter for long-term (current) use of other medications   . Esophageal reflux   . Insomnia, unspecified   . Leukocytosis, unspecified   . Migraine without aura, without mention of intractable migraine without mention of status migrainosus   . Other and unspecified hyperlipidemia   . Other malaise and fatigue   . Overweight(278.02)   . Personal history of chemotherapy now   ovarian  . PONV (postoperative nausea and vomiting)   . Spinal stenosis in cervical region   . Symptomatic menopausal or female climacteric states   . Unspecified disorder of skin and subcutaneous tissue   . Unspecified vitamin D deficiency     PAST SURGICAL HISTORY: Past Surgical History:  Procedure Laterality Date  . ABDOMINAL HYSTERECTOMY    . ANTERIOR CERVICAL DECOMP/DISCECTOMY FUSION N/A 06/07/2015   Procedure: Cervical three - four and Cervical six- seven anterior cervical decompression with fusion interbody prosthesis plating and bonegraft;  Surgeon: Newman Pies, MD;  Location: [facility];  Service: Neurosurgery;  Laterality: N/A;  C34 and C67 anterior cervical decompression with fusion interbody prosthesis plating and bonegraft  .  BACK SURGERY     x2 Lower   . EVACUATION OF CERVICAL HEMATOMA N/A 06/14/2015   Procedure: EVACUATION OF CERVICAL HEMATOMA;  Surgeon: Newman Pies, MD;  Location: [facility];  Service: Neurosurgery;  Laterality: N/A;  . NECK SURGERY     x3  . TONSILLECTOMY      FAMILY HISTORY: Family History    Problem Relation Age of Onset  . Depression Mother   . Migraines Mother   . Dementia Father   . Diabetes Father   . Hyperlipidemia Father   . Hyperlipidemia Brother   . Hyperlipidemia Brother   . Breast cancer Paternal Aunt        78s    ADVANCED DIRECTIVES (Y/N):  N  HEALTH MAINTENANCE: Social History   Tobacco Use  . Smoking status: Former Smoker    Packs/day: 1.50    Years: 20.00    Pack years: 30.00    Types: Cigarettes    Start date: 03/19/1979    Last attempt to quit: 08/25/1999    Years since quitting: 18.0  . Smokeless tobacco: Never Used  . Tobacco comment: smoking cessation materials not required  Substance Use Topics  . Alcohol use: Not Currently    Alcohol/week: 0.0 oz  . Drug use: No     Colonoscopy:  PAP:  Bone density:  Lipid panel:  No Known Allergies  Current Outpatient Medications  Medication Sig Dispense Refill  . albuterol (PROVENTIL HFA;VENTOLIN HFA) 108 (90 Base) MCG/ACT inhaler Inhale 2 puffs into the lungs every 6 (six) hours as needed for wheezing or shortness of breath. 1 Inhaler 2  . albuterol (PROVENTIL) (2.5 MG/3ML) 0.083% nebulizer solution Take 3 mLs (2.5 mg total) by nebulization every 6 (six) hours as needed for wheezing or shortness of breath. 75 mL 2  . ALPRAZolam (XANAX) 0.25 MG tablet Take 1 tablet (0.25 mg total) by mouth 2 (two) times daily as needed for anxiety. 60 tablet 0  . Ascorbic Acid (VITAMIN C PO) Take by mouth.    . budesonide-formoterol (SYMBICORT) 160-4.5 MCG/ACT inhaler Inhale 2 puffs into the lungs 2 (two) times daily.    . Cholecalciferol (VITAMIN D-1000 MAX ST) 1000 UNITS tablet Take 1 tablet by mouth 2 (two) times daily.    . Cyanocobalamin (B-12 PO) Take 1 tablet by mouth daily.     . cyclobenzaprine (FLEXERIL) 10 MG tablet TAKE 1 TABLET BY MOUTH THREE TIMES A DAY IF NEEDED FOR MUSCLE SPASM 30 tablet 0  . estradiol (ESTRACE) 0.5 MG tablet take 1 tablet by mouth once daily 90 tablet 4  . ferrous sulfate (IRON  SUPPLEMENT) 325 (65 FE) MG tablet Take 1 tablet by mouth daily.    Marland Kitchen FLUoxetine (PROZAC) 40 MG capsule Take 1 capsule (40 mg total) by mouth daily. 90 capsule 0  . fluticasone (FLONASE) 50 MCG/ACT nasal spray Place 2 sprays into both nostrils as needed. 16 g 5  . gabapentin (NEURONTIN) 300 MG capsule Take 1-3 capsules (300-900 mg total) by mouth 3 (three) times daily. 300 mg twice a day and 900 mg at night 540 capsule 1  . ipratropium (ATROVENT HFA) 17 MCG/ACT inhaler Inhale 2 puffs into the lungs every 6 (six) hours.    . magic mouthwash w/lidocaine SOLN Take 5 mLs by mouth 3 (three) times daily. 315 mL 0  . Magnesium Oxide 500 MG CAPS Take 500 mg by mouth 2 (two) times daily.     . Multiple Vitamins-Minerals (MULTIVITAMIN PO) Take 1 tablet by mouth  daily.     . Omega-3 Fatty Acids (FISH OIL PO) Take by mouth.    . oxyCODONE-acetaminophen (PERCOCET) 10-325 MG tablet Take 1 tablet by mouth every 8 (eight) hours as needed for pain. 90 tablet 0  . potassium gluconate 595 MG TABS tablet Take 595 mg by mouth daily.    . predniSONE (DELTASONE) 5 MG tablet Take 1 tablet (5 mg total) by mouth daily with breakfast. 10 tablet 0  . SUMAtriptan (IMITREX) 100 MG tablet TAKE 1 TABLET BY MOUTH IF NEEDED FOR MIGRAINES AS DIRECTED 27 tablet 0  . valACYclovir (VALTREX) 1000 MG tablet Take 1 tablet (1,000 mg total) by mouth 2 (two) times daily for 10 days then take 1/2 tablet (500 mg total) by mouth 2 (two) times daily. 40 tablet 0  . zolpidem (AMBIEN CR) 6.25 MG CR tablet Take 1 tablet (6.25 mg total) by mouth at bedtime. 90 tablet 0  . fentaNYL (DURAGESIC - DOSED MCG/HR) 50 MCG/HR Place 1 patch (50 mcg total) onto the skin every 3 (three) days. 10 patch 0  . predniSONE (STERAPRED UNI-PAK 21 TAB) 10 MG (21) TBPK tablet Taper as directed 21 tablet 0   No current facility-administered medications for this visit.    Facility-Administered Medications Ordered in Other Visits  Medication Dose Route Frequency Provider  Last Rate Last Dose  . 0.9 %  sodium chloride infusion   Intravenous Once Lloyd Huger, MD      . 0.9 %  sodium chloride infusion   Intravenous Once Lloyd Huger, MD      . dexamethasone (DECADRON) 20 mg in sodium chloride 0.9 % 50 mL IVPB  20 mg Intravenous Once Lloyd Huger, MD      . dexamethasone (DECADRON) injection 10 mg  10 mg Intravenous Once Lloyd Huger, MD      . sodium chloride flush (NS) 0.9 % injection 10 mL  10 mL Intravenous PRN Lloyd Huger, MD   10 mL at 06/16/17 0854  . sodium chloride flush (NS) 0.9 % injection 10 mL  10 mL Intravenous PRN Lloyd Huger, MD   10 mL at 09/15/17 0835    OBJECTIVE: Vitals:   09/15/17 0852 09/15/17 0859  BP:  134/83  Pulse:  69  Resp: 16   Temp:  97.8 F (36.6 C)     Body mass index is 28.72 kg/m.    ECOG FS:0 - Asymptomatic  General: Well-developed, well-nourished, no acute distress. Eyes: Pink conjunctiva, anicteric sclera. HEENT: Normocephalic, moist mucous membranes, clear oropharnyx. Lungs: Clear to auscultation bilaterally. Heart: Regular rate and rhythm. No rubs, murmurs, or gallops. Abdomen: Soft, nontender, nondistended. No organomegaly noted, normoactive bowel sounds. Musculoskeletal: No edema, cyanosis, or clubbing. Neuro: Alert, answering all questions appropriately. Cranial nerves grossly intact. Skin: Maculopapular rash noted on torso and extremities. Psych: Normal affect.   LAB RESULTS:  Lab Results  Component Value Date   NA 138 09/15/2017   K 3.8 09/15/2017   CL 99 09/15/2017   CO2 28 09/15/2017   GLUCOSE 96 09/15/2017   BUN 16 09/15/2017   CREATININE 0.98 09/15/2017   CALCIUM 8.9 09/15/2017   PROT 6.7 09/15/2017   ALBUMIN 3.9 09/15/2017   AST 22 09/15/2017   ALT 15 09/15/2017   ALKPHOS 79 09/15/2017   BILITOT 0.2 (L) 09/15/2017   GFRNONAA >60 09/15/2017   GFRAA >60 09/15/2017    Lab Results  Component Value Date   WBC 4.6 09/15/2017   NEUTROABS 1.8  09/15/2017   HGB 11.8 (L) 09/15/2017   HCT 33.0 (L) 09/15/2017   MCV 100.0 09/15/2017   PLT 238 09/15/2017     STUDIES: No results found.  ASSESSMENT: Stage IIIc high-grade serous ovarian carcinoma  PLAN:  1. Stage IIIc high-grade serous ovarian carcinoma:  She underwent debulking surgery at [facility] on November 19, 2016. She was also initiated on a clinical trial and has received preoperative infusion of Keytruda on November 11, 2016.  Because of patient's persistent peripheral neuropathy as well as pancytopenia, Taxol was discontinued on April 28, 2017.  CT scan from June 19, 2017 is essentially unchanged from previous scan in January. Patient had a pretreatment MUGA on Aug 15, 2017 which revealed an EF 78%.  Patient Ca-125 is now trending down.  Proceed with cycle 2, day 1 of Doxil and Avastin today.  Return to clinic in 2 weeks for further evaluation and consideration of cycle 2, day 15 which is Avastin only. 2. Pain: Continue  50 mcg fentanyl patch and oxycodone as needed.  Previously discussed with patient about referring back to PCP for pain management at conclusion of therapy.  Patient was given a prescription for fentanyl patch today.   3. Anxiety: Chronic and unchanged.  Continue Xanax as prescribed. 4.  Peripheral neuropathy: Continue gabapentin 600 mg 3 times per day. Will consider neurology referral in the future.   5.  Anemia: Patient's hemoglobin has trended down slightly, monitor. 6.  Leukopenia: Resolved.  Monitor. 7.  Rash: Possible drug rash related to Valtrex.  Patient was given a Medrol Dosepak.  Will consider discontinuing Valtrex if there is no resolution.  Patient expressed understanding and was in agreement with this plan. She also understands that She can call clinic at any time with any questions, concerns, or complaints.   Cancer Staging Ovarian cancer, unspecified laterality ([place]) Staging form: Ovary, Fallopian Tube, and Primary Peritoneal Carcinoma,  AJCC 8th Edition - Clinical: No stage assigned - Unsigned   Lloyd Huger, MD   09/15/2017 12:35 PM

## 2017-09-15 ENCOUNTER — Other Ambulatory Visit: Payer: Self-pay | Admitting: *Deleted

## 2017-09-15 ENCOUNTER — Inpatient Hospital Stay (HOSPITAL_BASED_OUTPATIENT_CLINIC_OR_DEPARTMENT_OTHER): Payer: PPO | Admitting: Oncology

## 2017-09-15 ENCOUNTER — Other Ambulatory Visit: Payer: Self-pay

## 2017-09-15 ENCOUNTER — Inpatient Hospital Stay: Payer: PPO | Attending: Oncology

## 2017-09-15 ENCOUNTER — Inpatient Hospital Stay: Payer: PPO

## 2017-09-15 ENCOUNTER — Encounter: Payer: Self-pay | Admitting: Oncology

## 2017-09-15 VITALS — BP 134/83 | HR 69 | Temp 97.8°F | Resp 16 | Ht 62.0 in | Wt 157.0 lb

## 2017-09-15 DIAGNOSIS — Z79899 Other long term (current) drug therapy: Secondary | ICD-10-CM | POA: Insufficient documentation

## 2017-09-15 DIAGNOSIS — M9904 Segmental and somatic dysfunction of sacral region: Secondary | ICD-10-CM | POA: Diagnosis not present

## 2017-09-15 DIAGNOSIS — D61810 Antineoplastic chemotherapy induced pancytopenia: Secondary | ICD-10-CM | POA: Insufficient documentation

## 2017-09-15 DIAGNOSIS — L271 Localized skin eruption due to drugs and medicaments taken internally: Secondary | ICD-10-CM | POA: Diagnosis not present

## 2017-09-15 DIAGNOSIS — D649 Anemia, unspecified: Secondary | ICD-10-CM | POA: Diagnosis not present

## 2017-09-15 DIAGNOSIS — C569 Malignant neoplasm of unspecified ovary: Secondary | ICD-10-CM | POA: Diagnosis not present

## 2017-09-15 DIAGNOSIS — Z5112 Encounter for antineoplastic immunotherapy: Secondary | ICD-10-CM | POA: Diagnosis not present

## 2017-09-15 DIAGNOSIS — R21 Rash and other nonspecific skin eruption: Secondary | ICD-10-CM | POA: Diagnosis not present

## 2017-09-15 DIAGNOSIS — Z5111 Encounter for antineoplastic chemotherapy: Secondary | ICD-10-CM | POA: Insufficient documentation

## 2017-09-15 DIAGNOSIS — R52 Pain, unspecified: Secondary | ICD-10-CM | POA: Diagnosis not present

## 2017-09-15 DIAGNOSIS — Z87891 Personal history of nicotine dependence: Secondary | ICD-10-CM | POA: Insufficient documentation

## 2017-09-15 DIAGNOSIS — M9903 Segmental and somatic dysfunction of lumbar region: Secondary | ICD-10-CM | POA: Diagnosis not present

## 2017-09-15 DIAGNOSIS — R234 Changes in skin texture: Secondary | ICD-10-CM | POA: Insufficient documentation

## 2017-09-15 DIAGNOSIS — F419 Anxiety disorder, unspecified: Secondary | ICD-10-CM | POA: Diagnosis not present

## 2017-09-15 DIAGNOSIS — G629 Polyneuropathy, unspecified: Secondary | ICD-10-CM | POA: Diagnosis not present

## 2017-09-15 DIAGNOSIS — K1231 Oral mucositis (ulcerative) due to antineoplastic therapy: Secondary | ICD-10-CM | POA: Insufficient documentation

## 2017-09-15 DIAGNOSIS — Z95828 Presence of other vascular implants and grafts: Secondary | ICD-10-CM

## 2017-09-15 DIAGNOSIS — M5442 Lumbago with sciatica, left side: Secondary | ICD-10-CM | POA: Diagnosis not present

## 2017-09-15 LAB — COMPREHENSIVE METABOLIC PANEL
ALK PHOS: 79 U/L (ref 38–126)
ALT: 15 U/L (ref 0–44)
AST: 22 U/L (ref 15–41)
Albumin: 3.9 g/dL (ref 3.5–5.0)
Anion gap: 11 (ref 5–15)
BILIRUBIN TOTAL: 0.2 mg/dL — AB (ref 0.3–1.2)
BUN: 16 mg/dL (ref 8–23)
CALCIUM: 8.9 mg/dL (ref 8.9–10.3)
CO2: 28 mmol/L (ref 22–32)
CREATININE: 0.98 mg/dL (ref 0.44–1.00)
Chloride: 99 mmol/L (ref 98–111)
Glucose, Bld: 96 mg/dL (ref 70–99)
Potassium: 3.8 mmol/L (ref 3.5–5.1)
Sodium: 138 mmol/L (ref 135–145)
TOTAL PROTEIN: 6.7 g/dL (ref 6.5–8.1)

## 2017-09-15 LAB — CBC WITH DIFFERENTIAL/PLATELET
BASOS PCT: 1 %
Basophils Absolute: 0 10*3/uL (ref 0–0.1)
EOS ABS: 0.2 10*3/uL (ref 0–0.7)
EOS PCT: 4 %
HCT: 33 % — ABNORMAL LOW (ref 35.0–47.0)
HEMOGLOBIN: 11.8 g/dL — AB (ref 12.0–16.0)
Lymphocytes Relative: 41 %
Lymphs Abs: 1.9 10*3/uL (ref 1.0–3.6)
MCH: 35.8 pg — AB (ref 26.0–34.0)
MCHC: 35.8 g/dL (ref 32.0–36.0)
MCV: 100 fL (ref 80.0–100.0)
Monocytes Absolute: 0.7 10*3/uL (ref 0.2–0.9)
Monocytes Relative: 14 %
NEUTROS PCT: 40 %
Neutro Abs: 1.8 10*3/uL (ref 1.4–6.5)
Platelets: 238 10*3/uL (ref 150–440)
RBC: 3.3 MIL/uL — AB (ref 3.80–5.20)
RDW: 15.1 % — ABNORMAL HIGH (ref 11.5–14.5)
WBC: 4.6 10*3/uL (ref 3.6–11.0)

## 2017-09-15 MED ORDER — DIPHENHYDRAMINE HCL 25 MG PO CAPS
25.0000 mg | ORAL_CAPSULE | Freq: Once | ORAL | Status: AC
  Administered 2017-09-15: 25 mg via ORAL
  Filled 2017-09-15: qty 1

## 2017-09-15 MED ORDER — HEPARIN SOD (PORK) LOCK FLUSH 100 UNIT/ML IV SOLN
500.0000 [IU] | Freq: Once | INTRAVENOUS | Status: AC
  Administered 2017-09-15: 500 [IU] via INTRAVENOUS
  Filled 2017-09-15: qty 5

## 2017-09-15 MED ORDER — PREDNISONE 10 MG (21) PO TBPK: ORAL_TABLET | ORAL | 0 refills | Status: DC

## 2017-09-15 MED ORDER — DEXTROSE 5 % IV SOLN
Freq: Once | INTRAVENOUS | Status: AC
Start: 2017-09-15 — End: 2017-09-15
  Administered 2017-09-15: 11:00:00 via INTRAVENOUS
  Filled 2017-09-15: qty 1000

## 2017-09-15 MED ORDER — DEXAMETHASONE SODIUM PHOSPHATE 10 MG/ML IJ SOLN
10.0000 mg | Freq: Once | INTRAMUSCULAR | Status: AC
  Administered 2017-09-15: 10 mg via INTRAVENOUS
  Filled 2017-09-15: qty 1

## 2017-09-15 MED ORDER — FENTANYL 50 MCG/HR TD PT72: 50.0000 ug | MEDICATED_PATCH | TRANSDERMAL | 0 refills | Status: DC

## 2017-09-15 MED ORDER — SODIUM CHLORIDE 0.9 % IV SOLN: 10.0000 mg | Freq: Once | INTRAVENOUS | Status: DC

## 2017-09-15 MED ORDER — SODIUM CHLORIDE 0.9% FLUSH
10.0000 mL | INTRAVENOUS | Status: DC | PRN
  Administered 2017-09-15: 10 mL via INTRAVENOUS
  Filled 2017-09-15: qty 10

## 2017-09-15 MED ORDER — DOXORUBICIN HCL LIPOSOMAL CHEMO INJECTION 2 MG/ML
70.0000 mg | Freq: Once | INTRAVENOUS | Status: AC
  Administered 2017-09-15: 70 mg via INTRAVENOUS
  Filled 2017-09-15: qty 25

## 2017-09-15 MED ORDER — SODIUM CHLORIDE 0.9 % IV SOLN
700.0000 mg | Freq: Once | INTRAVENOUS | Status: AC
  Administered 2017-09-15: 700 mg via INTRAVENOUS
  Filled 2017-09-15: qty 16

## 2017-09-15 MED ORDER — SODIUM CHLORIDE 0.9 % IV SOLN
Freq: Once | INTRAVENOUS | Status: AC
  Administered 2017-09-15: 10:00:00 via INTRAVENOUS
  Filled 2017-09-15: qty 1000

## 2017-09-15 NOTE — Progress Notes (Signed)
Patient here for follow up. She has a diffuse fine red rash over her entire body that is itchy. She also reports that her mouth continues to be sore.

## 2017-09-16 LAB — CA 125: Cancer Antigen (CA) 125: 124.1 U/mL — ABNORMAL HIGH (ref 0.0–38.1)

## 2017-09-22 ENCOUNTER — Other Ambulatory Visit: Payer: Self-pay | Admitting: Family Medicine

## 2017-09-22 DIAGNOSIS — M5441 Lumbago with sciatica, right side: Secondary | ICD-10-CM

## 2017-09-22 DIAGNOSIS — G8929 Other chronic pain: Secondary | ICD-10-CM

## 2017-09-22 DIAGNOSIS — F329 Major depressive disorder, single episode, unspecified: Secondary | ICD-10-CM

## 2017-09-22 DIAGNOSIS — M542 Cervicalgia: Secondary | ICD-10-CM

## 2017-09-22 DIAGNOSIS — F411 Generalized anxiety disorder: Secondary | ICD-10-CM

## 2017-09-22 NOTE — Telephone Encounter (Signed)
Refill request for general medication. Duloxetine    Last office visit 07/28/17  Follow up on 10/28/17

## 2017-09-28 NOTE — Progress Notes (Signed)
[name]  [phone]) [phone] [phone]) [phone]  ID: Jessica Burgess OB: 04-10-1955  MR#: [id number]  FOY#:[id number]  Patient Care Team: Steele Sizer, MD as PCP - General (Family Medicine) Lloyd Huger, MD as Consulting Physician (Oncology) Mellody Drown, MD as Consulting Physician (Obstetrics and Gynecology)  CHIEF COMPLAINT: Stage IIIc high-grade serous ovarian carcinoma  INTERVAL HISTORY: Patient returns to clinic today for further evaluation and consideration of cycle 2, day 15 of Doxil and Avastin.  Avastin only today.  She continues to have mild soreness that have improved with the use of Magic mouthwash.  She continues to take prophylactic Valtrex as well. She continues to have significant peripheral neuropathy. She has no other neurologic complaints.  She denies any chest pain, shortness of breath, or cough.  She has a good appetite and denies any nausea, vomiting, constipation, or diarrhea.  She denies any abdominal pain or bloating.  She has no urinary complaints.  Patient offers no further specific complaints today.  REVIEW OF SYSTEMS:   Review of Systems  Constitutional: Negative.  Negative for fever, malaise/fatigue and weight loss.  HENT: Positive for sore throat.   Respiratory: Negative.  Negative for cough and shortness of breath.   Cardiovascular: Negative.  Negative for chest pain and leg swelling.  Gastrointestinal: Negative.  Negative for abdominal pain, blood in stool, constipation, diarrhea, melena, nausea and vomiting.  Genitourinary: Negative.  Negative for dysuria.  Musculoskeletal: Negative.  Negative for back pain, joint pain and neck pain.  Skin: Negative.  Negative for itching and rash.  Neurological: Positive for tingling and sensory change. Negative for focal weakness and weakness.  Psychiatric/Behavioral: Negative.  The patient is not nervous/anxious.     As per HPI. Otherwise, a complete review of systems is  negative.  PAST MEDICAL HISTORY: Past Medical History:  Diagnosis Date  . Allergic rhinitis, cause unspecified   . Anxiety state, unspecified   . Arthritis   . Asthma    only when sick   . Backache, unspecified   . Bronchitis    hx of when get sick  . Cancer ([place])    skin cancer , basal cell   . Cancer ([place]) 11/2016   ovarian  . Cervicalgia   . Complication of anesthesia   . Dermatophytosis of nail   . Dysmetabolic syndrome X   . Encounter for long-term (current) use of other medications   . Esophageal reflux   . Insomnia, unspecified   . Leukocytosis, unspecified   . Migraine without aura, without mention of intractable migraine without mention of status migrainosus   . Other and unspecified hyperlipidemia   . Other malaise and fatigue   . Overweight(278.02)   . Personal history of chemotherapy now   ovarian  . PONV (postoperative nausea and vomiting)   . Spinal stenosis in cervical region   . Symptomatic menopausal or female climacteric states   . Unspecified disorder of skin and subcutaneous tissue   . Unspecified vitamin D deficiency     PAST SURGICAL HISTORY: Past Surgical History:  Procedure Laterality Date  . ABDOMINAL HYSTERECTOMY    . ANTERIOR CERVICAL DECOMP/DISCECTOMY FUSION N/A 06/07/2015   Procedure: Cervical three - four and Cervical six- seven anterior cervical decompression with fusion interbody prosthesis plating and bonegraft;  Surgeon: Newman Pies, MD;  Location: [facility];  Service: Neurosurgery;  Laterality: N/A;  C34 and C67 anterior cervical decompression with fusion interbody prosthesis plating and bonegraft  . BACK SURGERY  x2 Lower   . EVACUATION OF CERVICAL HEMATOMA N/A 06/14/2015   Procedure: EVACUATION OF CERVICAL HEMATOMA;  Surgeon: Newman Pies, MD;  Location: [facility];  Service: Neurosurgery;  Laterality: N/A;  . NECK SURGERY     x3  . TONSILLECTOMY      FAMILY HISTORY: Family History  Problem Relation Age of Onset    . Depression Mother   . Migraines Mother   . Dementia Father   . Diabetes Father   . Hyperlipidemia Father   . Hyperlipidemia Brother   . Hyperlipidemia Brother   . Breast cancer Paternal Aunt        26s    ADVANCED DIRECTIVES (Y/N):  N  HEALTH MAINTENANCE: Social History   Tobacco Use  . Smoking status: Former Smoker    Packs/day: 1.50    Years: 20.00    Pack years: 30.00    Types: Cigarettes    Start date: 03/19/1979    Last attempt to quit: 08/25/1999    Years since quitting: 18.1  . Smokeless tobacco: Never Used  . Tobacco comment: smoking cessation materials not required  Substance Use Topics  . Alcohol use: Not Currently    Alcohol/week: 0.0 oz  . Drug use: No     Colonoscopy:  PAP:  Bone density:  Lipid panel:  No Known Allergies  Current Outpatient Medications  Medication Sig Dispense Refill  . albuterol (PROVENTIL HFA;VENTOLIN HFA) 108 (90 Base) MCG/ACT inhaler Inhale 2 puffs into the lungs every 6 (six) hours as needed for wheezing or shortness of breath. 1 Inhaler 2  . albuterol (PROVENTIL) (2.5 MG/3ML) 0.083% nebulizer solution Take 3 mLs (2.5 mg total) by nebulization every 6 (six) hours as needed for wheezing or shortness of breath. 75 mL 2  . ALPRAZolam (XANAX) 0.25 MG tablet Take 1 tablet (0.25 mg total) by mouth 2 (two) times daily as needed for anxiety. 60 tablet 0  . Ascorbic Acid (VITAMIN C PO) Take by mouth.    . budesonide-formoterol (SYMBICORT) 160-4.5 MCG/ACT inhaler Inhale 2 puffs into the lungs 2 (two) times daily.    . Cholecalciferol (VITAMIN D-1000 MAX ST) 1000 UNITS tablet Take 1 tablet by mouth 2 (two) times daily.    . Cyanocobalamin (B-12 PO) Take 1 tablet by mouth daily.     . cyclobenzaprine (FLEXERIL) 10 MG tablet TAKE 1 TABLET BY MOUTH THREE TIMES A DAY IF NEEDED FOR MUSCLE SPASM 30 tablet 0  . estradiol (ESTRACE) 0.5 MG tablet take 1 tablet by mouth once daily 90 tablet 4  . fentaNYL (DURAGESIC - DOSED MCG/HR) 50 MCG/HR Place 1  patch (50 mcg total) onto the skin every 3 (three) days. 10 patch 0  . ferrous sulfate (IRON SUPPLEMENT) 325 (65 FE) MG tablet Take 1 tablet by mouth daily.    . fluticasone (FLONASE) 50 MCG/ACT nasal spray Place 2 sprays into both nostrils as needed. 16 g 5  . gabapentin (NEURONTIN) 300 MG capsule Take 1-3 capsules (300-900 mg total) by mouth 3 (three) times daily. 300 mg twice a day and 900 mg at night 540 capsule 1  . ipratropium (ATROVENT HFA) 17 MCG/ACT inhaler Inhale 2 puffs into the lungs every 6 (six) hours.    . magic mouthwash w/lidocaine SOLN Take 5 mLs by mouth 3 (three) times daily. 315 mL 0  . Magnesium Oxide 500 MG CAPS Take 500 mg by mouth 2 (two) times daily.     . Multiple Vitamins-Minerals (MULTIVITAMIN PO) Take 1 tablet by mouth  daily.     . Omega-3 Fatty Acids (FISH OIL PO) Take by mouth.    . oxyCODONE-acetaminophen (PERCOCET) 10-325 MG tablet Take 1 tablet by mouth every 8 (eight) hours as needed for pain. 90 tablet 0  . potassium gluconate 595 MG TABS tablet Take 595 mg by mouth daily.    . predniSONE (DELTASONE) 5 MG tablet Take 1 tablet (5 mg total) by mouth daily with breakfast. 10 tablet 0  . predniSONE (STERAPRED UNI-PAK 21 TAB) 10 MG (21) TBPK tablet Taper as directed 21 tablet 0  . SUMAtriptan (IMITREX) 100 MG tablet TAKE 1 TABLET BY MOUTH IF NEEDED FOR MIGRAINES AS DIRECTED 27 tablet 0  . valACYclovir (VALTREX) 1000 MG tablet Take 1 tablet (1,000 mg total) by mouth 2 (two) times daily for 10 days then take 1/2 tablet (500 mg total) by mouth 2 (two) times daily. 40 tablet 0  . zolpidem (AMBIEN CR) 6.25 MG CR tablet Take 1 tablet (6.25 mg total) by mouth at bedtime. 90 tablet 0  . DULoxetine (CYMBALTA) 60 MG capsule Take 1 capsule (60 mg total) by mouth daily. 90 capsule 0   No current facility-administered medications for this visit.    Facility-Administered Medications Ordered in Other Visits  Medication Dose Route Frequency Provider Last Rate Last Dose  . 0.9 %   sodium chloride infusion   Intravenous Once Lloyd Huger, MD      . 0.9 %  sodium chloride infusion   Intravenous Once Lloyd Huger, MD      . dexamethasone (DECADRON) 20 mg in sodium chloride 0.9 % 50 mL IVPB  20 mg Intravenous Once Lloyd Huger, MD      . dexamethasone (DECADRON) injection 10 mg  10 mg Intravenous Once Lloyd Huger, MD      . sodium chloride flush (NS) 0.9 % injection 10 mL  10 mL Intravenous PRN Lloyd Huger, MD   10 mL at 06/16/17 0854    OBJECTIVE: Vitals:   09/29/17 1025  BP: (!) 153/89  Pulse: 68  Resp: 18  Temp: (!) 96.3 F (35.7 C)     Body mass index is 28.41 kg/m.    ECOG FS:0 - Asymptomatic  General: Well-developed, well-nourished, no acute distress. Eyes: Pink conjunctiva, anicteric sclera. HEENT: Normocephalic, moist mucous membranes. Lungs: Clear to auscultation bilaterally. Heart: Regular rate and rhythm. No rubs, murmurs, or gallops. Abdomen: Soft, nontender, nondistended. No organomegaly noted, normoactive bowel sounds. Musculoskeletal: No edema, cyanosis, or clubbing. Neuro: Alert, answering all questions appropriately. Cranial nerves grossly intact. Skin: No rashes or petechiae noted. Psych: Normal affect.   LAB RESULTS:  Lab Results  Component Value Date   NA 135 09/29/2017   K 3.6 09/29/2017   CL 99 09/29/2017   CO2 27 09/29/2017   GLUCOSE 131 (H) 09/29/2017   BUN 19 09/29/2017   CREATININE 0.88 09/29/2017   CALCIUM 9.2 09/29/2017   PROT 7.2 09/29/2017   ALBUMIN 4.0 09/29/2017   AST 23 09/29/2017   ALT 24 09/29/2017   ALKPHOS 71 09/29/2017   BILITOT 0.5 09/29/2017   GFRNONAA >60 09/29/2017   GFRAA >60 09/29/2017    Lab Results  Component Value Date   WBC 7.6 09/29/2017   NEUTROABS 6.0 09/29/2017   HGB 12.0 09/29/2017   HCT 34.1 (L) 09/29/2017   MCV 99.7 09/29/2017   PLT 213 09/29/2017     STUDIES: No results found.  ASSESSMENT: Stage IIIc high-grade serous ovarian  carcinoma  PLAN:  1. Stage IIIc high-grade serous ovarian carcinoma:  She underwent debulking surgery at [facility] on November 19, 2016. She was also initiated on a clinical trial and has received preoperative infusion of Keytruda on November 11, 2016.  Because of patient's persistent peripheral neuropathy as well as pancytopenia, Taxol was discontinued on April 28, 2017.  CT scan from June 19, 2017 is essentially unchanged from previous scan in January. Patient had a pretreatment MUGA on Aug 15, 2017 which revealed an EF 78%.  Patient Ca-125 is now trending down although today's result slightly increased.  Proceed with cycle 2, day 15 of Doxil and Avastin today.  Avastin only.  Return to clinic in 2 weeks for further evaluation and consideration of cycle 3, day 1.   2. Pain: Well-controlled on current narcotic regimen.  Continue 50 mcg fentanyl patch and oxycodone as needed.  Previously discussed with patient about referring back to PCP for pain management at conclusion of therapy.  Patient was given a prescription for fentanyl patch today.   3. Anxiety: Chronic and unchanged.  Continue Xanax as prescribed. 4.  Peripheral neuropathy: Continue gabapentin 600 mg 3 times per day. Will consider neurology referral in the future.   5.  Anemia: Patient's hemoglobin is now within normal limits.  Monitor. 6.  Leukopenia: Resolved.  Monitor. 7.  Rash: Resolved with Medrol Dosepak.  Monitor. 8.  Mouth sores: Continue prophylactic Valtrex and Magic mouthwash as needed.  Patient expressed understanding and was in agreement with this plan. She also understands that She can call clinic at any time with any questions, concerns, or complaints.   Cancer Staging Ovarian cancer, unspecified laterality ([place]) Staging form: Ovary, Fallopian Tube, and Primary Peritoneal Carcinoma, AJCC 8th Edition - Clinical: No stage assigned - Unsigned   Lloyd Huger, MD   10/03/2017 11:26 AM

## 2017-09-29 ENCOUNTER — Inpatient Hospital Stay: Payer: PPO

## 2017-09-29 ENCOUNTER — Encounter: Payer: Self-pay | Admitting: Oncology

## 2017-09-29 ENCOUNTER — Inpatient Hospital Stay (HOSPITAL_BASED_OUTPATIENT_CLINIC_OR_DEPARTMENT_OTHER): Payer: PPO | Admitting: Oncology

## 2017-09-29 ENCOUNTER — Telehealth: Payer: Self-pay | Admitting: Family Medicine

## 2017-09-29 ENCOUNTER — Other Ambulatory Visit: Payer: Self-pay | Admitting: Family Medicine

## 2017-09-29 VITALS — BP 153/89 | HR 68 | Temp 96.3°F | Resp 18 | Wt 155.3 lb

## 2017-09-29 DIAGNOSIS — G629 Polyneuropathy, unspecified: Secondary | ICD-10-CM

## 2017-09-29 DIAGNOSIS — D61810 Antineoplastic chemotherapy induced pancytopenia: Secondary | ICD-10-CM

## 2017-09-29 DIAGNOSIS — F419 Anxiety disorder, unspecified: Secondary | ICD-10-CM

## 2017-09-29 DIAGNOSIS — C569 Malignant neoplasm of unspecified ovary: Secondary | ICD-10-CM

## 2017-09-29 DIAGNOSIS — Z5112 Encounter for antineoplastic immunotherapy: Secondary | ICD-10-CM | POA: Diagnosis not present

## 2017-09-29 DIAGNOSIS — Z87891 Personal history of nicotine dependence: Secondary | ICD-10-CM | POA: Diagnosis not present

## 2017-09-29 LAB — CBC WITH DIFFERENTIAL/PLATELET
BASOS PCT: 0 %
Basophils Absolute: 0 10*3/uL (ref 0–0.1)
EOS ABS: 0 10*3/uL (ref 0–0.7)
EOS PCT: 0 %
HCT: 34.1 % — ABNORMAL LOW (ref 35.0–47.0)
HEMOGLOBIN: 12 g/dL (ref 12.0–16.0)
LYMPHS ABS: 1.1 10*3/uL (ref 1.0–3.6)
Lymphocytes Relative: 15 %
MCH: 35.1 pg — AB (ref 26.0–34.0)
MCHC: 35.2 g/dL (ref 32.0–36.0)
MCV: 99.7 fL (ref 80.0–100.0)
MONO ABS: 0.5 10*3/uL (ref 0.2–0.9)
Monocytes Relative: 6 %
NEUTROS PCT: 79 %
Neutro Abs: 6 10*3/uL (ref 1.4–6.5)
Platelets: 213 10*3/uL (ref 150–440)
RBC: 3.42 MIL/uL — ABNORMAL LOW (ref 3.80–5.20)
RDW: 14.3 % (ref 11.5–14.5)
WBC: 7.6 10*3/uL (ref 3.6–11.0)

## 2017-09-29 LAB — URINALYSIS, COMPLETE (UACMP) WITH MICROSCOPIC
Bilirubin Urine: NEGATIVE
Glucose, UA: NEGATIVE mg/dL
Hgb urine dipstick: NEGATIVE
KETONES UR: NEGATIVE mg/dL
LEUKOCYTES UA: NEGATIVE
NITRITE: NEGATIVE
PROTEIN: NEGATIVE mg/dL
Specific Gravity, Urine: 1.021 (ref 1.005–1.030)
pH: 5 (ref 5.0–8.0)

## 2017-09-29 LAB — COMPREHENSIVE METABOLIC PANEL
ALBUMIN: 4 g/dL (ref 3.5–5.0)
ALK PHOS: 71 U/L (ref 38–126)
ALT: 24 U/L (ref 0–44)
AST: 23 U/L (ref 15–41)
Anion gap: 9 (ref 5–15)
BUN: 19 mg/dL (ref 8–23)
CALCIUM: 9.2 mg/dL (ref 8.9–10.3)
CHLORIDE: 99 mmol/L (ref 98–111)
CO2: 27 mmol/L (ref 22–32)
Creatinine, Ser: 0.88 mg/dL (ref 0.44–1.00)
GFR calc non Af Amer: >60 mL/min (ref >60)
GLUCOSE: 131 mg/dL — AB (ref 70–99)
Potassium: 3.6 mmol/L (ref 3.5–5.1)
SODIUM: 135 mmol/L (ref 135–145)
Total Bilirubin: 0.5 mg/dL (ref 0.3–1.2)
Total Protein: 7.2 g/dL (ref 6.5–8.1)

## 2017-09-29 MED ORDER — HEPARIN SOD (PORK) LOCK FLUSH 100 UNIT/ML IV SOLN: 500.0000 [IU] | Freq: Once | INTRAVENOUS | Status: DC | PRN

## 2017-09-29 MED ORDER — SODIUM CHLORIDE 0.9 % IV SOLN
Freq: Once | INTRAVENOUS | Status: AC
  Administered 2017-09-29: 12:00:00 via INTRAVENOUS
  Filled 2017-09-29: qty 1000

## 2017-09-29 MED ORDER — SODIUM CHLORIDE 0.9 % IV SOLN
700.0000 mg | Freq: Once | INTRAVENOUS | Status: AC
  Administered 2017-09-29: 700 mg via INTRAVENOUS
  Filled 2017-09-29: qty 12

## 2017-09-29 MED ORDER — DEXAMETHASONE SODIUM PHOSPHATE 10 MG/ML IJ SOLN
10.0000 mg | Freq: Once | INTRAMUSCULAR | Status: AC
  Administered 2017-09-29: 10 mg via INTRAVENOUS
  Filled 2017-09-29: qty 1

## 2017-09-29 MED ORDER — SODIUM CHLORIDE 0.9 % IV SOLN: 10.0000 mg | Freq: Once | INTRAVENOUS | Status: DC

## 2017-09-29 MED ORDER — SODIUM CHLORIDE 0.9% FLUSH
10.0000 mL | Freq: Once | INTRAVENOUS | Status: DC
  Filled 2017-09-29: qty 10

## 2017-09-29 MED ORDER — HEPARIN SOD (PORK) LOCK FLUSH 100 UNIT/ML IV SOLN
500.0000 [IU] | Freq: Once | INTRAVENOUS | Status: AC
  Administered 2017-09-29: 500 [IU] via INTRAVENOUS
  Filled 2017-09-29: qty 5

## 2017-09-29 MED ORDER — DULOXETINE HCL 60 MG PO CPEP: 60.0000 mg | ORAL_CAPSULE | Freq: Every day | ORAL | 0 refills | Status: DC

## 2017-09-29 NOTE — Telephone Encounter (Signed)
Copied from [place] [id number]. Topic: Quick Communication - See Telephone Encounter >> Sep 29, 2017  4:24 PM Gardiner Ramus wrote: CRM for notification. See Telephone encounter for: 09/29/17. Pt is calling to state that she has not taken the prozac but would like to continue to take duloxetine. Please advise Cb# [phone]

## 2017-09-29 NOTE — Telephone Encounter (Signed)
Sent duloxetine 60 mg daily

## 2017-09-29 NOTE — Progress Notes (Signed)
Pt in for follow up, continues to have mouth sores on sides of mouth and tongue.  Pt tearful regarding current hospital and clinic finances.

## 2017-09-30 LAB — CA 125: Cancer Antigen (CA) 125: 143.5 U/mL — ABNORMAL HIGH (ref 0.0–38.1)

## 2017-10-04 ENCOUNTER — Other Ambulatory Visit: Payer: Self-pay | Admitting: Nurse Practitioner

## 2017-10-06 ENCOUNTER — Encounter: Payer: Self-pay | Admitting: Nurse Practitioner

## 2017-10-06 ENCOUNTER — Inpatient Hospital Stay (HOSPITAL_BASED_OUTPATIENT_CLINIC_OR_DEPARTMENT_OTHER): Payer: PPO | Admitting: Nurse Practitioner

## 2017-10-06 ENCOUNTER — Other Ambulatory Visit: Payer: Self-pay

## 2017-10-06 VITALS — BP 158/91 | HR 69 | Temp 97.9°F | Resp 18 | Ht 62.0 in | Wt 151.0 lb

## 2017-10-06 DIAGNOSIS — Z87891 Personal history of nicotine dependence: Secondary | ICD-10-CM

## 2017-10-06 DIAGNOSIS — Z5112 Encounter for antineoplastic immunotherapy: Secondary | ICD-10-CM | POA: Diagnosis not present

## 2017-10-06 DIAGNOSIS — K1231 Oral mucositis (ulcerative) due to antineoplastic therapy: Secondary | ICD-10-CM | POA: Diagnosis not present

## 2017-10-06 DIAGNOSIS — T451X5A Adverse effect of antineoplastic and immunosuppressive drugs, initial encounter: Secondary | ICD-10-CM

## 2017-10-06 DIAGNOSIS — C569 Malignant neoplasm of unspecified ovary: Secondary | ICD-10-CM | POA: Diagnosis not present

## 2017-10-06 DIAGNOSIS — R234 Changes in skin texture: Secondary | ICD-10-CM

## 2017-10-06 MED ORDER — UREA 10 % EX CREA: TOPICAL_CREAM | CUTANEOUS | 1 refills | Status: DC | PRN

## 2017-10-06 MED ORDER — TRIAMCINOLONE ACETONIDE 0.1 % MT PSTE: 1.0000 "application " | PASTE | Freq: Two times a day (BID) | OROMUCOSAL | 2 refills | Status: DC

## 2017-10-06 NOTE — Progress Notes (Signed)
Symptom [facility] [place]  [phone] [phone]  Patient Care Team: Steele Sizer, MD as PCP - General (Family Medicine) Lloyd Huger, MD as Consulting Physician (Oncology) Mellody Drown, MD as Consulting Physician (Obstetrics and Gynecology)   Name of the patient: Jessica Burgess  [id number]  [birth year]   Date of visit: 10/06/17  Diagnosis-stage IIIc high-grade serous ovarian carcinoma  Chief complaint/ Reason for visit- Mouth Sores  Heme/Onc history:    Ovarian cancer, unspecified laterality ([place])   11/12/2016 Initial Diagnosis    Ovarian cancer, unspecified laterality ([place])      08/08/2017 -  Chemotherapy    The patient had bevacizumab (AVASTIN) 700 mg in sodium chloride 0.9 % 100 mL chemo infusion, 725 mg, Intravenous,  Once, 2 of 6 cycles Administration: 700 mg (08/18/2017), 700 mg (09/01/2017), 700 mg (09/15/2017), 700 mg (09/29/2017) DOXOrubicin HCL LIPOSOMAL (DOXIL) 70 mg in dextrose 5 % 250 mL chemo infusion, 72 mg, Intravenous,  Once, 2 of 6 cycles Administration: 70 mg (08/18/2017), 70 mg (09/15/2017)  for chemotherapy treatment.        Interval history-Jessica Burgess is a 63 year old female who presents to symptom management clinic for complaints of mouth sores and tenderness of skin on hands.   Jessica Burgess is a 62 y.o. female who complains of mouth sores. She noticed symptoms after recent chemo and they have gradually worsened. She localizes mouth sores to underside of tongue, insides of cheeks, and insides of lips.  She previously had mouth sores that were diagnosed as viral and was treated with Valtrex.  She states that the symptoms are different.  She describes them as incredibly painful and interfere with her ability to tolerate oral intake.  She has tried salt water rinses at home and Magic mouthwash with some improvement in pain.  She states that her oxycodone and fentanyl does not improve mouth pain.   She has been increasing her oral hygiene and uses a soft toothbrush. She states that the circular oral lesions she previously experienced resolved with valtrex and she states that she had requested a refill of this medication as she is to stay on prophylaxis.   She also reports redness of her hands and pain. She states this also appeared after her most recent cycle of chemotherapy. She has been trying to keep her hands moisturized but notices increased redness and reports that they feel inflamed/swollen. No blisters, sores, lesions. She has had peripheral neuropathy but feels that is stable.    ECOG FS:1 - Symptomatic but completely ambulatory  Review of systems- Review of Systems  Constitutional: Negative for chills, fever, malaise/fatigue and weight loss.  HENT: Negative for congestion, ear discharge, ear pain, sinus pain, sore throat and tinnitus.   Eyes: Negative.   Respiratory: Negative.  Negative for cough, sputum production and shortness of breath.   Cardiovascular: Negative for chest pain, palpitations, orthopnea, claudication and leg swelling.  Gastrointestinal: Negative for abdominal pain, blood in stool, constipation, diarrhea, heartburn, nausea and vomiting.  Genitourinary: Negative.   Musculoskeletal: Negative.   Skin:       Mouth sores & irritation of palms of hands  Neurological: Negative for dizziness, tingling, weakness and headaches.  Endo/Heme/Allergies: Negative.   Psychiatric/Behavioral: The patient is nervous/anxious.     Current treatment- Bev-doxil on 09/15/17 and bev on 09/29/17  No Known Allergies  Past Medical History:  Diagnosis Date  . Allergic rhinitis, cause unspecified   . Anxiety state, unspecified   . Arthritis   .  Asthma    only when sick   . Backache, unspecified   . Bronchitis    hx of when get sick  . Cancer ([place])    skin cancer , basal cell   . Cancer ([place]) 11/2016   ovarian  . Cervicalgia   . Complication of anesthesia   .  Dermatophytosis of nail   . Dysmetabolic syndrome X   . Encounter for long-term (current) use of other medications   . Esophageal reflux   . Insomnia, unspecified   . Leukocytosis, unspecified   . Migraine without aura, without mention of intractable migraine without mention of status migrainosus   . Other and unspecified hyperlipidemia   . Other malaise and fatigue   . Overweight(278.02)   . Personal history of chemotherapy now   ovarian  . PONV (postoperative nausea and vomiting)   . Spinal stenosis in cervical region   . Symptomatic menopausal or female climacteric states   . Unspecified disorder of skin and subcutaneous tissue   . Unspecified vitamin D deficiency     Past Surgical History:  Procedure Laterality Date  . ABDOMINAL HYSTERECTOMY    . ANTERIOR CERVICAL DECOMP/DISCECTOMY FUSION N/A 06/07/2015   Procedure: Cervical three - four and Cervical six- seven anterior cervical decompression with fusion interbody prosthesis plating and bonegraft;  Surgeon: Newman Pies, MD;  Location: [facility];  Service: Neurosurgery;  Laterality: N/A;  C34 and C67 anterior cervical decompression with fusion interbody prosthesis plating and bonegraft  . BACK SURGERY     x2 Lower   . EVACUATION OF CERVICAL HEMATOMA N/A 06/14/2015   Procedure: EVACUATION OF CERVICAL HEMATOMA;  Surgeon: Newman Pies, MD;  Location: [facility];  Service: Neurosurgery;  Laterality: N/A;  . NECK SURGERY     x3  . TONSILLECTOMY      Social History   Socioeconomic History  . Marital status: Single    Spouse name: Not on file  . Number of children: 0  . Years of education: some college  . Highest education level: 12th grade  Occupational History    Employer: DISABLED  . Occupation: Disabled   Social Needs  . Financial resource strain: Very hard  . Food insecurity:    Worry: Never true    Inability: Never true  . Transportation needs:    Medical: No    Non-medical: No  Tobacco Use  . Smoking  status: Former Smoker    Packs/day: 1.50    Years: 20.00    Pack years: 30.00    Types: Cigarettes    Start date: 03/19/1979    Last attempt to quit: 08/25/1999    Years since quitting: 18.1  . Smokeless tobacco: Never Used  . Tobacco comment: smoking cessation materials not required  Substance and Sexual Activity  . Alcohol use: Not Currently    Alcohol/week: 0.0 oz  . Drug use: No  . Sexual activity: Never  Lifestyle  . Physical activity:    Days per week: 0 days    Minutes per session: 0 min  . Stress: Very much  Relationships  . Social connections:    Talks on phone: Patient refused    Gets together: Patient refused    Attends religious service: Patient refused    Active member of club or organization: Patient refused    Attends meetings of clubs or organizations: Patient refused    Relationship status: Patient refused  . Intimate partner violence:    Fear of current or ex partner: No  Emotionally abused: No    Physically abused: No    Forced sexual activity: No  Other Topics Concern  . Not on file  Social History Narrative   Patient is single.    Patient lives with roommates.    Patient on disability    Patient has no children.    Patient has some college     Family History  Problem Relation Age of Onset  . Depression Mother   . Migraines Mother   . Dementia Father   . Diabetes Father   . Hyperlipidemia Father   . Hyperlipidemia Brother   . Hyperlipidemia Brother   . Breast cancer Paternal Aunt        67s     Current Outpatient Medications:  .  ALPRAZolam (XANAX) 0.25 MG tablet, Take 1 tablet (0.25 mg total) by mouth 2 (two) times daily as needed for anxiety., Disp: 60 tablet, Rfl: 0 .  Ascorbic Acid (VITAMIN C PO), Take 1 tablet by mouth daily. , Disp: , Rfl:  .  Cyanocobalamin (B-12 PO), Take 1 tablet by mouth daily. , Disp: , Rfl:  .  cyclobenzaprine (FLEXERIL) 10 MG tablet, TAKE 1 TABLET BY MOUTH THREE TIMES A DAY IF NEEDED FOR MUSCLE SPASM, Disp:  30 tablet, Rfl: 0 .  DULoxetine (CYMBALTA) 60 MG capsule, Take 1 capsule (60 mg total) by mouth daily., Disp: 90 capsule, Rfl: 0 .  estradiol (ESTRACE) 0.5 MG tablet, take 1 tablet by mouth once daily, Disp: 90 tablet, Rfl: 4 .  fentaNYL (DURAGESIC - DOSED MCG/HR) 50 MCG/HR, Place 1 patch (50 mcg total) onto the skin every 3 (three) days., Disp: 10 patch, Rfl: 0 .  ferrous sulfate (IRON SUPPLEMENT) 325 (65 FE) MG tablet, Take 1 tablet by mouth daily., Disp: , Rfl:  .  fluticasone (FLONASE) 50 MCG/ACT nasal spray, Place 2 sprays into both nostrils as needed., Disp: 16 g, Rfl: 5 .  gabapentin (NEURONTIN) 300 MG capsule, Take 1-3 capsules (300-900 mg total) by mouth 3 (three) times daily. 300 mg twice a day and 900 mg at night, Disp: 540 capsule, Rfl: 1 .  ipratropium (ATROVENT HFA) 17 MCG/ACT inhaler, Inhale 2 puffs into the lungs every 6 (six) hours., Disp: , Rfl:  .  magic mouthwash w/lidocaine SOLN, Take 5 mLs by mouth 3 (three) times daily., Disp: 315 mL, Rfl: 0 .  Magnesium Oxide 500 MG CAPS, Take 500 mg by mouth 2 (two) times daily. , Disp: , Rfl:  .  Multiple Vitamins-Minerals (MULTIVITAMIN PO), Take 1 tablet by mouth daily. , Disp: , Rfl:  .  Omega-3 Fatty Acids (FISH OIL PO), Take by mouth., Disp: , Rfl:  .  oxyCODONE-acetaminophen (PERCOCET) 10-325 MG tablet, Take 1 tablet by mouth every 8 (eight) hours as needed for pain., Disp: 90 tablet, Rfl: 0 .  potassium gluconate 595 MG TABS tablet, Take 595 mg by mouth daily., Disp: , Rfl:  .  SUMAtriptan (IMITREX) 100 MG tablet, TAKE 1 TABLET BY MOUTH IF NEEDED FOR MIGRAINES AS DIRECTED, Disp: 27 tablet, Rfl: 0 .  zolpidem (AMBIEN CR) 6.25 MG CR tablet, Take 1 tablet (6.25 mg total) by mouth at bedtime., Disp: 90 tablet, Rfl: 0 .  albuterol (PROVENTIL HFA;VENTOLIN HFA) 108 (90 Base) MCG/ACT inhaler, Inhale 2 puffs into the lungs every 6 (six) hours as needed for wheezing or shortness of breath. (Patient not taking: Reported on 10/06/2017), Disp: 1  Inhaler, Rfl: 2 .  albuterol (PROVENTIL) (2.5 MG/3ML) 0.083% nebulizer solution, Take 3 mLs (  2.5 mg total) by nebulization every 6 (six) hours as needed for wheezing or shortness of breath. (Patient not taking: Reported on 10/06/2017), Disp: 75 mL, Rfl: 2 .  budesonide-formoterol (SYMBICORT) 160-4.5 MCG/ACT inhaler, Inhale 2 puffs into the lungs 2 (two) times daily., Disp: , Rfl:  .  Cholecalciferol (VITAMIN D-1000 MAX ST) 1000 UNITS tablet, Take 1 tablet by mouth 2 (two) times daily., Disp: , Rfl:  .  triamcinolone (KENALOG) 0.1 % paste, Use as directed 1 application in the mouth or throat 2 (two) times daily., Disp: 5 g, Rfl: 2 .  urea (CARMOL) 10 % cream, Apply topically as needed., Disp: 410 g, Rfl: 1 .  valACYclovir (VALTREX) 1000 MG tablet, TAKE 1 TABLET(1000 MG) BY MOUTH TWICE DAILY FOR 10 DAYS THEN TAKE 1/2 TABLET(500 MG) BY MOUTH TWICE DAILY, Disp: 40 tablet, Rfl: 0 No current facility-administered medications for this visit.   Facility-Administered Medications Ordered in Other Visits:  .  0.9 %  sodium chloride infusion, , Intravenous, Once, Finnegan, Kathlene November, MD .  0.9 %  sodium chloride infusion, , Intravenous, Once, Grayland Ormond, Kathlene November, MD .  dexamethasone (DECADRON) 20 mg in sodium chloride 0.9 % 50 mL IVPB, 20 mg, Intravenous, Once, Grayland Ormond, Kathlene November, MD .  dexamethasone (DECADRON) injection 10 mg, 10 mg, Intravenous, Once, Finnegan, Kathlene November, MD .  sodium chloride flush (NS) 0.9 % injection 10 mL, 10 mL, Intravenous, PRN, Lloyd Huger, MD, 10 mL at 06/16/17 0854  Physical exam:  Vitals:   10/06/17 0920 10/06/17 0925  BP: (!) 164/112 (!) 158/91  Pulse: 69 69  Resp: 18   Temp: 97.9 F (36.6 C)   Weight: 151 lb (68.5 kg)   Height: 5\' 2"  (1.575 m)    Physical Exam  Constitutional: She is oriented to person, place, and time. She appears well-developed and well-nourished.  HENT:  Head: Atraumatic.  Nose: Nose normal.  Mouth/Throat: No oropharyngeal exudate.    Erythematous areas, elevated white desquamative patches, shallow ulcerations on tongue and mucosal surfaces. No evidence of secondary infection  Eyes: Conjunctivae are normal. No scleral icterus.  Neck: Normal range of motion.  Cardiovascular: Normal rate, regular rhythm and normal heart sounds.  Pulmonary/Chest: Effort normal and breath sounds normal.  Abdominal: Soft. Bowel sounds are normal.  Musculoskeletal: She exhibits no edema.  Neurological: She is alert and oriented to person, place, and time.  Skin: Skin is warm and dry.  Redness of hands bilaterally. No blisters or peeling.   Psychiatric: Her mood appears anxious.     CMP Latest Ref Rng & Units 09/29/2017  Glucose 70 - 99 mg/dL 131(H)  BUN 8 - 23 mg/dL 19  Creatinine 0.44 - 1.00 mg/dL 0.88  Sodium 135 - 145 mmol/L 135  Potassium 3.5 - 5.1 mmol/L 3.6  Chloride 98 - 111 mmol/L 99  CO2 22 - 32 mmol/L 27  Calcium 8.9 - 10.3 mg/dL 9.2  Total Protein 6.5 - 8.1 g/dL 7.2  Total Bilirubin 0.3 - 1.2 mg/dL 0.5  Alkaline Phos 38 - 126 U/L 71  AST 15 - 41 U/L 23  ALT 0 - 44 U/L 24   CBC Latest Ref Rng & Units 09/29/2017  WBC 3.6 - 11.0 K/uL 7.6  Hemoglobin 12.0 - 16.0 g/dL 12.0  Hematocrit 35.0 - 47.0 % 34.1(L)  Platelets 150 - 440 K/uL 213    No images are attached to the encounter.  No results found.  Assessment and plan- Patient is a 62 y.o. female ovarian  carcinoma who presents to symptom management clinic for mucositis and hand-foot syndrome.   1.  Stage IIIc high-grade serous ovarian carcinoma- s/p debulking surgery on 11/19/16.  Received preoperative Keytruda as part of clinical trial. Completed 7 cycles of carbo-taxol. Taxol was discontinued for cycle 8, 9 d/t neuropathy and pancytopenia. 06/19/17 CT showed stable residual nodularity in left upper omentum similar to 03/2017 scan. Switched to bev-doxil and now s/p cycle 2. CA-125 improved previously. Elevated since May 2019. Most recently 143.5 (09/29/17). Anticipate  re-imaging after next cycle and recommend re-evaluation with gyn-onc.    2.  Chemotherapy-induced mucositis-grade 3. Discussed that symptoms are often self-limiting and lesions resolve spontaneously generally within 10 to 14 days after chemotherapy cycle.  Discussed oral hygiene including soft sponge oral/dental hygiene, chlorhexidine rinses after PO intake. Also discussed oral rinses with a weak solution of salt and baking soda (1/2 teaspoon of salt and 1 teaspoon of baking soda and a quart of water) should be performed every 4 hours.  Hydrogen peroxide (diluted 1:1 with water) should only be used for debridement and chronic use may delay healing.  Explained that diet should be limited to foods that do not require significant chewing as this may cause irritation and she should avoid acidic, salty, or dry foods.  Nutritional supplements and shakes were discussed.  Continue Magic Mouthwash. Will refill valtrex for prophylaxis of HSV. Can start triamcinolone dental paste BID to lesions. If persistent symptoms or unrelieved pain would consider morphine mouthwash 0.2% swish and spit, or topical doxepin oral rinse 0.5%.   3. Skin changes related to chemotherapy- Hand-Foot Syndrome- Grade 1-2, mild pain but no peeling, blisters and does not interfere with ADLs.  Given mild symptoms-recommend starting urea 10% cream.    Follow-up as scheduled with Dr. Grayland Ormond. Will get scheduled to follow-up with Gyn-Onc for follow-up.   Visit Diagnosis 1. Ovarian cancer, unspecified laterality ([place])   2. Mucositis due to chemotherapy   3. Skin changes related to chemotherapy     Patient expressed understanding and was in agreement with this plan. She also understands that She can call clinic at any time with any questions, concerns, or complaints.   Thank you for allowing me to participate in the care of this very pleasant patient.   Beckey Rutter, DNP, AGNP-C [facility] [phone] (work  cell) [phone] (office) 10/06/17 6:53 PM

## 2017-10-06 NOTE — Patient Instructions (Signed)
Oral Mucositis Oral mucositis is a mouth condition that may develop from treatments for cancer. With this condition, sores may appear on your lips, gums, tongue, throat, and the top (roof) or bottom (floor) of your mouth. What are the causes? Oral mucositis can happen to anyone who is being treated with cancer therapies, including:  Cancer medicines (chemotherapy).  Radiation therapy.  Bone marrow transplants and stem cell transplants.  Cancer treatments can damage the lining of the mouth, and that causes this condition. Oral mucositis is not caused by infection. However, the sores can become infected after they form. Infection can make oral mucositis worse. What increases the risk? This condition is more likely to develop in people:  With poor oral hygiene.  With dental problems or oral diseases.  Who use any tobacco product, including cigarettes, chewing tobacco, and electronic cigarettes.  Who drink alcohol.  Who have other medical conditions, such as diabetes, HIV, AIDS, or kidney disease.  Who do not drink enough clear fluids.  Who wear dentures that do not fit correctly.  Who have cancers that primarily affect the blood.  Who are female.  What are the signs or symptoms? Symptoms can vary from mild to severe. Symptoms are usually seen 7-10 days after cancer treatment has started. Symptoms include:  Mouth sores. These sores may bleed.  Color changes inside the mouth. Red, shiny areas may appear.  White patches or pus in the mouth.  Pain in the mouth and throat. This can make it painful to speak.  Dryness and a burning feeling in the mouth.  Saliva that is dry and thick.  Trouble eating, drinking, and swallowing. This can lead to weight loss.  How is this diagnosed? This condition can be diagnosed with a physical exam. How is this treated? Treatment depends on the severity of the condition. Oral mucositis often heals on its own. Sometimes, changes in the cancer  treatment can help. Treatment may include medicines, such as:  An antibiotic medicine to fight infection, if present.  Medicine to help the cells in your mouth heal more quickly.  Medicine may also be given to help control pain. This may include:  Pain relievers that are swished around in the mouth. These make the mouth numb to ease the pain (topical anesthetics).  Mouth rinses.  Prescribed, medicated gels. The gel coats the mouth. This protects nerve endings and lessens pain.  Narcotic pain medicines.  Follow these instructions at home: Medicines  Do not use products that contain benzocaine (including numbing gels) to treat teething or mouth pain in children who are younger than 2 years. These products may cause a rare but serious blood condition.  If you were prescribed an antibiotic medicine, finish all of it even if you start to feel better.  Take or apply medicines only as directed by your health care provider. Lifestyle  Keeping your mouth clean and germ-free is important. To maintain good oral hygiene: ? Brush your teeth carefully with a soft, nylon-bristled toothbrush at least two times each day. Use a gentle toothpaste. Ask your health care provider for a toothpaste recommendation. ? Floss your teeth every day. ? Have your teeth cleaned regularly as recommended by your dentist. ? Rinse your mouth after every meal or as directed by your health care provider. Do not use mouthwash that contains alcohol. Ask your health care provider for a mouthwash recommendation.  Do not use any tobacco products, including cigarettes, chewing tobacco, and electronic cigarettes. If you need help quitting, ask  your health care provider.  Avoid eating: ? Hot and spicy foods. ? Citrus. ? Foods that have sharp edges, such as chips. ? Sugary foods, such as candy.  Do not drink alcohol. General instructions  Clean the sores as directed by your health care provider.  If your lips are dry  or cracked, apply a water-based moisturizer to your lips as needed.  Try sucking on ice chips or sugar-free frozen pops. This may help with pain. This also keeps your mouth moist.  Drink enough fluid to keep your urine clear or pale yellow.  If you are losing weight, talk with your health care provider.  If you have dentures, take them out often as directed by your health care provider.  Keep all follow-up visits as directed by your health care provider. This is important. Contact a health care provider if:  You have mouth pain or throat pain.  You are having more trouble swallowing.  Your symptoms get worse.  You have new symptoms.  Your pain is not controlled with medicine. Get help right away if:  You have a lot of bleeding in your mouth.  You have trouble speaking.  You develop new, open, or draining sores in your mouth.  You cannot swallow solid food or liquids.  You have a fever. This information is not intended to replace advice given to you by your health care provider. Make sure you discuss any questions you have with your health care provider. Document Released: 10/19/2010 Document Revised: 08/09/2016 Document Reviewed: 02/28/2014 Elsevier Interactive Patient Education  2018 [employer].  Stomatitis Stomatitis is a condition that causes inflammation in your mouth. It can affect a part of your mouth or your whole mouth. The condition often affects your cheek, teeth, gums, lips, and tongue. Stomatitis can also affect the mucous membranes that surround your mouth (mucosa). Pain from stomatitis can make it hard for you to eat or drink. Severe cases of this condition can lead to dehydration or poor nutrition. What are the causes? Common causes of this condition include:  Viruses, such as cold sores or oral herpes and shingles.  Canker sores.  Bacterial infections.  Fungus or yeast infections, such as oral thrush.  Not getting adequate nutrition.  Injury to  your mouth. This can be from: ? Dentures or braces that do not fit well. ? Biting your tongue or cheek. ? Burning your mouth. ? Having sharp or broken teeth.  Gum disease.  Using tobacco, especially chewing tobacco.  Allergies to foods, medicines, or substances that are used in your mouth.  Medicines, including cancer medicines (chemotherapy), antihistamines, and seizure medicines.  In some cases, the cause may not be known. What increases the risk? This condition is more likely to develop in people who:  Have poor oral hygiene or poor nutrition.  Have any condition that causes a dry mouth.  Are under a lot of physical or emotional stress.  Have any condition that weakens the body's defense system (immune system).  Are being treated for cancer.  Smoke.  What are the signs or symptoms? The most common symptoms of this condition are pain, swelling, and redness inside your mouth. The pain may feel like burning or stinging. It may get worse from eating or drinking. Other symptoms include:  Painful, shallow sores (ulcers) in the mouth.  Blisters in the mouth.  Bleeding gums.  Swollen gums.  Irritability and fatigue.  Bad breath.  Bad taste in the mouth.  Fever.  How is this  diagnosed? This condition is diagnosed with a physical exam to check for bleeding gums and mouth ulcers. You may also have other tests, including:  Blood tests to look for infection or vitamin deficiencies.  Mouth swab to get a fluid sample to test for bacteria (culture).  Tissue sample from an ulcer to examine under a microscope (biopsy).  How is this treated? Treatment for stomatitis depends on the cause. Treatment may include medicines, such as:  Over-the counter (OTC) pain medicines.  Topical anesthetic to numb the area if you have severe pain.  Antibiotics to treat a bacterial infection.  Antifungals to treat a fungal infection.  Antivirals to treat a viral infection.  Mouth  rinses that contain steroids to reduce the swelling in your mouth.  Other medicines to coat or numb your mouth.  Follow these instructions at home: Medicines  Take medicines only as directed by your health care provider.  If you were prescribed an antibiotic, finish all of it even if you start to feel better. Lifestyle  Practice good oral hygiene: ? Gently brush your teeth with a soft, nylon-bristled toothbrush two times each day. ? Floss your teeth every day. ? Have your teeth cleaned regularly, as recommended by your dentist.  Eat a balanced diet.Do not eat: ? Spicy foods. ? Citrus, such as oranges. ? Foods that have sharp edges, such as chips.  Avoid any foods or other allergens that you think may be causing your stomatitis.  If you have dentures, make sure that they are properly fitted.  Do not use any tobacco products, including cigarettes, chewing tobacco, or electronic cigarettes. If you need help quitting, ask your health care provider.  Find ways to reduce stress. Try yoga or meditation. Ask your health care provider for other ideas. General instructions  Use a salt-water rinse for pain as directed by your health care provider. Mix 1 tsp of salt in 2 cups of water.  Drink enough fluid to keep your urine clear or pale yellow. This will keep you hydrated. Contact a health care provider if:  Your symptoms get worse.  You develop new symptoms, especially: ? A rash. ? New symptoms that do not involve your mouth area.  Your symptoms last longer than three weeks.  Your stomatitis goes away and then returns.  You have a harder time eating and drinking normally.  You have increasing fatigue or weakness.  You lose your appetite or you feel nauseous.  You have a fever. This information is not intended to replace advice given to you by your health care provider. Make sure you discuss any questions you have with your health care provider. Document Released:  12/30/2006 Document Revised: 11/01/2015 Document Reviewed: 02/28/2014 Elsevier Interactive Patient Education  2018 [place]. Triamcinolone dental paste What is this medicine? TRIAMCINOLONE (trye am SIN oh lone) is a corticosteroid. It is used to reduce swelling and ulcers in the mouth. It helps treat and relieve mouth sores that are not caused by herpes. This medicine may be used for other purposes; ask your health care provider or pharmacist if you have questions. COMMON BRAND NAME(S): Kenalog in [place], Oralone What should I tell my health care provider before I take this medicine? They need to know if you have any of these conditions: -any active infection -diabetes -stomach or intestine disease -an unusual or allergic reaction to triamcinolone, corticosteroids, other medicines, foods, dyes, or preservatives -pregnant or trying to get pregnant -breast-feeding How should I use this medicine? This medicine is  applied to sore places in the mouth. Follow the directions on the prescription label. Use the paste after meals and at night. Use a cotton swab to press a small amount of paste on the area to be treated to form a smooth film. Do not rub the paste into the area or try to spread it because it will become crumbly and gritty. Do not use more often than prescribed. Talk to your pediatrician regarding the use of this medicine in children. Special care may be needed. Overdosage: If you think you have taken too much of this medicine contact a poison control center or emergency room at once. NOTE: This medicine is only for you. Do not share this medicine with others. What if I miss a dose? If you miss a dose, use it as soon as you can. If it is almost time for your next dose, use only that dose. Do not use double or extra doses. What may interact with this medicine? Interactions are not expected. This list may not describe all possible interactions. Give your health care provider a list of  all the medicines, herbs, non-prescription drugs, or dietary supplements you use. Also tell them if you smoke, drink alcohol, or use illegal drugs. Some items may interact with your medicine. What should I watch for while using this medicine? Visit your doctor or health care professional for regular checks on your progress. Tell your doctor or health care professional if your symptoms do not start to get better or get worse. Do not use any leftover medicine at a later date without first checking with your doctor or health care professional. This medicine can make some conditions worse. What side effects may I notice from receiving this medicine? Side effects that you should report to your doctor or health care professional as soon as possible: -blistering or signs of infection in the mouth -burning or increased inflammation in the mouth Side effects that usually do not require medical attention (report to your doctor or health care professional if they continue or are bothersome): -minor irritation This list may not describe all possible side effects. Call your doctor for medical advice about side effects. You may report side effects to FDA at 1-800-FDA-1088. Where should I keep my medicine? Keep out of the reach of children. Store at room temperature between 15 and 30 degrees C (59 and 86 degrees F). Do not freeze. Protect from light. Throw away any unused medicine after the expiration date. NOTE: This sheet is a summary. It may not cover all possible information. If you have questions about this medicine, talk to your doctor, pharmacist, or health care provider.  2018 Elsevier/Gold Standard (2007-05-15 16:54:18) Urea skin cream, gel, lotion, ointment, or nail lacquer What is this medicine? UREA (yoo REE uh) is used to soften thick, rough, or dry skin caused by certain skin conditions. It is also used to soften and remove damaged or diseased nails without surgery. This medicine may be used for  other purposes; ask your health care provider or pharmacist if you have questions. COMMON BRAND NAME(S): Aluvea, BP-50% Urea, BP-K50, Carmol, CEM-Urea, Cerovel, DERMASORB XM Complete, Epimide-50, Gord Urea, Gordons Urea, Hydro 35, Baudette 40, [place], [place] 42, Keralac, Keralac [place], [place], [place] Plus, [place], [place], [place], Latrix, [place], Nutraplus, RE Urea 40, RE Urea 50, RE-U40, [place], [place], [place], U-Kera, U40, Ultra Mide 25, Ultralytic-2, [place], [place] [facility], [place], [place] GT, [place], Silvio [place], [place], [place], [place], [place], [place], Utopic, [place], X-VIATE What should I  tell my health care provider before I take this medicine? They need to know if you have any of these conditions: -broken, inflamed, or burnt skin -infection -an unusual or allergic reaction to urea, other medicines, foods, dyes, or preservatives -pregnant or trying to get pregnant -breast-feeding How should I use this medicine? This medicine is for external use only. Do not take by mouth. Follow the directions on the label. Apply a thin film to the affected area. The moisturizing effect may be better if this medicine is applied while the skin is still damp after washing or bathing. If applying to the nails, cover to protect the surrounding area. Apply generously to the affected nail. Let it dry uncovered or cover with an adhesive bandage or gauze secured with tape. The treated nail can be removed after several days. On exposure to air the nail bed hardens within 12 to 36 hours. Apply with caution to the face or on broken skin. Do not get this medicine in or near the eyes, lips or other areas of sensitive skin. Talk to your pediatrician regarding the use of this medicine in children. Special care may be needed. Overdosage: If you think you have taken too much of this medicine contact a poison control center or emergency room at once. NOTE: This medicine is only for you. Do not share this medicine  with others. What if I miss a dose? If you miss a dose, use it as soon as you can. If it is almost time for your next dose, use only that dose. Do not use double or extra doses. What may interact with this medicine? Interactions are not expected. Do not use any other skin products on the affected area without telling your doctor or health care professional. This list may not describe all possible interactions. Give your health care provider a list of all the medicines, herbs, non-prescription drugs, or dietary supplements you use. Also tell them if you smoke, drink alcohol, or use illegal drugs. Some items may interact with your medicine. What should I watch for while using this medicine? Tell your doctor or health care professional if your symptoms do not improve. What side effects may I notice from receiving this medicine? Side effects that you should report to your doctor or health care professional as soon as possible: -redness or irritation that does not go away Side effects that usually do not require medical attention (report to your doctor or health care professional if they continue or are bothersome): -skin rash -stinging, or irritation This list may not describe all possible side effects. Call your doctor for medical advice about side effects. You may report side effects to FDA at 1-800-FDA-1088. Where should I keep my medicine? Keep out of the reach of children. Store at room temperature between 15 and 30 degrees C (59 and 86 degrees F). Keep in a well closed container. Throw away any unused medicine after the expiration date. NOTE: This sheet is a summary. It may not cover all possible information. If you have questions about this medicine, talk to your doctor, pharmacist, or health care provider.  2018 Elsevier/Gold Standard (2007-11-30 16:44:58)

## 2017-10-06 NOTE — Progress Notes (Signed)
Patient walked in clinic and requested to see NP for mouth sores. Sores present under her tongue and lateral sides of mouth. Pt is unable to eat and drink without mouth pain.

## 2017-10-09 ENCOUNTER — Other Ambulatory Visit: Payer: Self-pay | Admitting: *Deleted

## 2017-10-09 DIAGNOSIS — C569 Malignant neoplasm of unspecified ovary: Secondary | ICD-10-CM

## 2017-10-13 ENCOUNTER — Inpatient Hospital Stay: Payer: PPO

## 2017-10-13 ENCOUNTER — Encounter: Payer: Self-pay | Admitting: Oncology

## 2017-10-13 ENCOUNTER — Inpatient Hospital Stay (HOSPITAL_BASED_OUTPATIENT_CLINIC_OR_DEPARTMENT_OTHER): Payer: PPO | Admitting: Oncology

## 2017-10-13 DIAGNOSIS — M5441 Lumbago with sciatica, right side: Secondary | ICD-10-CM

## 2017-10-13 DIAGNOSIS — Z5112 Encounter for antineoplastic immunotherapy: Secondary | ICD-10-CM | POA: Diagnosis not present

## 2017-10-13 DIAGNOSIS — C569 Malignant neoplasm of unspecified ovary: Secondary | ICD-10-CM | POA: Diagnosis not present

## 2017-10-13 DIAGNOSIS — F419 Anxiety disorder, unspecified: Secondary | ICD-10-CM | POA: Diagnosis not present

## 2017-10-13 DIAGNOSIS — G8929 Other chronic pain: Secondary | ICD-10-CM | POA: Diagnosis not present

## 2017-10-13 DIAGNOSIS — M542 Cervicalgia: Secondary | ICD-10-CM | POA: Diagnosis not present

## 2017-10-13 DIAGNOSIS — L271 Localized skin eruption due to drugs and medicaments taken internally: Secondary | ICD-10-CM

## 2017-10-13 LAB — CBC WITH DIFFERENTIAL/PLATELET
BASOS ABS: 0.1 10*3/uL (ref 0–0.1)
BASOS PCT: 1 %
Eosinophils Absolute: 0 10*3/uL (ref 0–0.7)
Eosinophils Relative: 1 %
HEMATOCRIT: 35.7 % (ref 35.0–47.0)
HEMOGLOBIN: 12.4 g/dL (ref 12.0–16.0)
Lymphocytes Relative: 38 %
Lymphs Abs: 2.7 10*3/uL (ref 1.0–3.6)
MCH: 34.7 pg — ABNORMAL HIGH (ref 26.0–34.0)
MCHC: 34.7 g/dL (ref 32.0–36.0)
MCV: 99.9 fL (ref 80.0–100.0)
MONO ABS: 0.9 10*3/uL (ref 0.2–0.9)
Monocytes Relative: 13 %
Neutro Abs: 3.4 10*3/uL (ref 1.4–6.5)
Neutrophils Relative %: 47 %
Platelets: 222 10*3/uL (ref 150–440)
RBC: 3.58 MIL/uL — ABNORMAL LOW (ref 3.80–5.20)
RDW: 14.8 % — AB (ref 11.5–14.5)
WBC: 7.1 10*3/uL (ref 3.6–11.0)

## 2017-10-13 LAB — COMPREHENSIVE METABOLIC PANEL
ALBUMIN: 3.6 g/dL (ref 3.5–5.0)
ALK PHOS: 73 U/L (ref 38–126)
ALT: 26 U/L (ref 0–44)
AST: 24 U/L (ref 15–41)
Anion gap: 8 (ref 5–15)
BILIRUBIN TOTAL: 0.5 mg/dL (ref 0.3–1.2)
BUN: 16 mg/dL (ref 8–23)
CALCIUM: 8.8 mg/dL — AB (ref 8.9–10.3)
CO2: 28 mmol/L (ref 22–32)
Chloride: 102 mmol/L (ref 98–111)
Creatinine, Ser: 0.71 mg/dL (ref 0.44–1.00)
GFR calc Af Amer: >60 mL/min (ref >60)
GFR calc non Af Amer: >60 mL/min (ref >60)
GLUCOSE: 77 mg/dL (ref 70–99)
Potassium: 3.9 mmol/L (ref 3.5–5.1)
Sodium: 138 mmol/L (ref 135–145)
TOTAL PROTEIN: 6.9 g/dL (ref 6.5–8.1)

## 2017-10-13 LAB — PROTEIN, URINE, RANDOM: Total Protein, Urine: 10 mg/dL

## 2017-10-13 MED ORDER — HEPARIN SOD (PORK) LOCK FLUSH 100 UNIT/ML IV SOLN
500.0000 [IU] | Freq: Once | INTRAVENOUS | Status: AC | PRN
  Administered 2017-10-13: 500 [IU]
  Filled 2017-10-13: qty 5

## 2017-10-13 MED ORDER — SODIUM CHLORIDE 0.9 % IV SOLN: 10.0000 mg | Freq: Once | INTRAVENOUS | Status: DC

## 2017-10-13 MED ORDER — OXYCODONE-ACETAMINOPHEN 10-325 MG PO TABS: 1.0000 | ORAL_TABLET | Freq: Three times a day (TID) | ORAL | 0 refills | Status: DC | PRN

## 2017-10-13 MED ORDER — FENTANYL 50 MCG/HR TD PT72: 50.0000 ug | MEDICATED_PATCH | TRANSDERMAL | 0 refills | Status: DC

## 2017-10-13 MED ORDER — DOXORUBICIN HCL LIPOSOMAL CHEMO INJECTION 2 MG/ML
70.0000 mg | Freq: Once | INTRAVENOUS | Status: AC
  Administered 2017-10-13: 70 mg via INTRAVENOUS
  Filled 2017-10-13: qty 10

## 2017-10-13 MED ORDER — SODIUM CHLORIDE 0.9 % IV SOLN
700.0000 mg | Freq: Once | INTRAVENOUS | Status: AC
  Administered 2017-10-13: 700 mg via INTRAVENOUS
  Filled 2017-10-13: qty 16

## 2017-10-13 MED ORDER — SODIUM CHLORIDE 0.9% FLUSH
10.0000 mL | INTRAVENOUS | Status: DC | PRN
  Administered 2017-10-13: 10 mL
  Filled 2017-10-13: qty 10

## 2017-10-13 MED ORDER — DEXAMETHASONE SODIUM PHOSPHATE 10 MG/ML IJ SOLN
10.0000 mg | Freq: Once | INTRAMUSCULAR | Status: AC
  Administered 2017-10-13: 10 mg via INTRAVENOUS
  Filled 2017-10-13: qty 1

## 2017-10-13 MED ORDER — DEXTROSE 5 % IV SOLN
Freq: Once | INTRAVENOUS | Status: AC
  Administered 2017-10-13: 11:00:00 via INTRAVENOUS
  Filled 2017-10-13: qty 1000

## 2017-10-13 MED ORDER — SODIUM CHLORIDE 0.9 % IV SOLN
Freq: Once | INTRAVENOUS | Status: AC
  Administered 2017-10-13: 10:00:00 via INTRAVENOUS
  Filled 2017-10-13: qty 1000

## 2017-10-13 NOTE — Progress Notes (Signed)
Jessica Burgess  [phone] [phone]) [phone]  ID: [name] OB: 1955-04-13  MR#: [id number]  TGG#:[id number]  Patient Care Team: Steele Sizer, MD as PCP - General (Family Medicine) Lloyd Huger, MD as Consulting Physician (Oncology) Mellody Drown, MD as Consulting Physician (Obstetrics and Gynecology)  CHIEF COMPLAINT: Stage IIIc high-grade serous ovarian carcinoma  INTERVAL HISTORY: Patient returns to clinic today for further evaluation and consideration of cycle 3-day 1 of Doxil and Avastin.  She was seen on 10/06/17 in [facility] clinic for mucositis and grade 1 hand-foot syndrome.  It was recommended that she use good oral/dental hygiene, chlorhexidine rinses, salt and baking soda rinses, continue Magic mouthwash and to start triamcinolone dental paste applied directly to lesions.  Urea cream 10% was prescribed for hand-foot syndrome.  States today mouth sores have improved dramatically.  States she still has "3 lesions".  She continues taking prophylactic Valtrex as prescribed.  Continues to have significant peripheral neuropathy but it is unchanged since her last visit.  Does not want a dose reduction.  Is applying urea cream daily without significant  Improvement.  She denies neurological complaints, chest pain, shortness of breath or cough.  She maintains a great appetite and denies any nausea, vomiting, constipation, diarrhea, abdominal pain or bloating.    REVIEW OF SYSTEMS:   Review of Systems  Constitutional: Positive for malaise/fatigue. Negative for chills, fever and weight loss.  HENT: Negative for congestion and ear pain.        Mucositis  Eyes: Negative.  Negative for blurred vision and double vision.  Respiratory: Negative.  Negative for cough, sputum production and shortness of breath.   Cardiovascular: Negative.  Negative for chest pain, palpitations and leg swelling.  Gastrointestinal: Negative.  Negative for abdominal pain,  constipation, diarrhea, nausea and vomiting.  Genitourinary: Negative for dysuria, frequency and urgency.  Musculoskeletal: Negative for back pain and falls.  Skin: Negative.  Negative for rash.  Neurological: Positive for sensory change (Significant peripheral neuropathy) and weakness. Negative for headaches.  Endo/Heme/Allergies: Negative.  Does not bruise/bleed easily.  Psychiatric/Behavioral: Negative for depression. The patient is nervous/anxious. The patient does not have insomnia.     As per HPI. Otherwise, a complete review of systems is negative.  PAST MEDICAL HISTORY: Past Medical History:  Diagnosis Date  . Allergic rhinitis, cause unspecified   . Anxiety state, unspecified   . Arthritis   . Asthma    only when sick   . Backache, unspecified   . Bronchitis    hx of when get sick  . Cancer ([place])    skin cancer , basal cell   . Cancer ([place]) 11/2016   ovarian  . Cervicalgia   . Complication of anesthesia   . Dermatophytosis of nail   . Dysmetabolic syndrome X   . Encounter for long-term (current) use of other medications   . Esophageal reflux   . Insomnia, unspecified   . Leukocytosis, unspecified   . Migraine without aura, without mention of intractable migraine without mention of status migrainosus   . Other and unspecified hyperlipidemia   . Other malaise and fatigue   . Overweight(278.02)   . Personal history of chemotherapy now   ovarian  . PONV (postoperative nausea and vomiting)   . Spinal stenosis in cervical region   . Symptomatic menopausal or female climacteric states   . Unspecified disorder of skin and subcutaneous tissue   . Unspecified vitamin D deficiency     PAST SURGICAL HISTORY: Past  Surgical History:  Procedure Laterality Date  . ABDOMINAL HYSTERECTOMY    . ANTERIOR CERVICAL DECOMP/DISCECTOMY FUSION N/A 06/07/2015   Procedure: Cervical three - four and Cervical six- seven anterior cervical decompression with fusion interbody prosthesis  plating and bonegraft;  Surgeon: Newman Pies, MD;  Location: [facility];  Service: Neurosurgery;  Laterality: N/A;  C34 and C67 anterior cervical decompression with fusion interbody prosthesis plating and bonegraft  . BACK SURGERY     x2 Lower   . EVACUATION OF CERVICAL HEMATOMA N/A 06/14/2015   Procedure: EVACUATION OF CERVICAL HEMATOMA;  Surgeon: Newman Pies, MD;  Location: [place] [facility];  Service: Neurosurgery;  Laterality: N/A;  . NECK SURGERY     x3  . TONSILLECTOMY      FAMILY HISTORY: Family History  Problem Relation Age of Onset  . Depression Mother   . Migraines Mother   . Dementia Father   . Diabetes Father   . Hyperlipidemia Father   . Hyperlipidemia Brother   . Hyperlipidemia Brother   . Breast cancer Paternal Aunt        96s    ADVANCED DIRECTIVES (Y/N):  N  HEALTH MAINTENANCE: Social History   Tobacco Use  . Smoking status: Former Smoker    Packs/day: 1.50    Years: 20.00    Pack years: 30.00    Types: Cigarettes    Start date: 03/19/1979    Last attempt to quit: 08/25/1999    Years since quitting: 18.1  . Smokeless tobacco: Never Used  . Tobacco comment: smoking cessation materials not required  Substance Use Topics  . Alcohol use: Not Currently    Alcohol/week: 0.0 oz  . Drug use: No     Colonoscopy:  PAP:  Bone density:  Lipid panel:  No Known Allergies  Current Outpatient Medications  Medication Sig Dispense Refill  . albuterol (PROVENTIL HFA;VENTOLIN HFA) 108 (90 Base) MCG/ACT inhaler Inhale 2 puffs into the lungs every 6 (six) hours as needed for wheezing or shortness of breath. (Patient not taking: Reported on 10/06/2017) 1 Inhaler 2  . albuterol (PROVENTIL) (2.5 MG/3ML) 0.083% nebulizer solution Take 3 mLs (2.5 mg total) by nebulization every 6 (six) hours as needed for wheezing or shortness of breath. (Patient not taking: Reported on 10/06/2017) 75 mL 2  . ALPRAZolam (XANAX) 0.25 MG tablet Take 1 tablet (0.25 mg total) by mouth 2 (two)  times daily as needed for anxiety. 60 tablet 0  . Ascorbic Acid (VITAMIN C PO) Take 1 tablet by mouth daily.     . budesonide-formoterol (SYMBICORT) 160-4.5 MCG/ACT inhaler Inhale 2 puffs into the lungs 2 (two) times daily.    . Cholecalciferol (VITAMIN D-1000 MAX ST) 1000 UNITS tablet Take 1 tablet by mouth 2 (two) times daily.    . Cyanocobalamin (B-12 PO) Take 1 tablet by mouth daily.     . cyclobenzaprine (FLEXERIL) 10 MG tablet TAKE 1 TABLET BY MOUTH THREE TIMES A DAY IF NEEDED FOR MUSCLE SPASM 30 tablet 0  . DULoxetine (CYMBALTA) 60 MG capsule Take 1 capsule (60 mg total) by mouth daily. 90 capsule 0  . estradiol (ESTRACE) 0.5 MG tablet take 1 tablet by mouth once daily 90 tablet 4  . fentaNYL (DURAGESIC - DOSED MCG/HR) 50 MCG/HR Place 1 patch (50 mcg total) onto the skin every 3 (three) days. 10 patch 0  . ferrous sulfate (IRON SUPPLEMENT) 325 (65 FE) MG tablet Take 1 tablet by mouth daily.    . fluticasone (FLONASE) 50 MCG/ACT  nasal spray Place 2 sprays into both nostrils as needed. 16 g 5  . gabapentin (NEURONTIN) 300 MG capsule Take 1-3 capsules (300-900 mg total) by mouth 3 (three) times daily. 300 mg twice a day and 900 mg at night 540 capsule 1  . ipratropium (ATROVENT HFA) 17 MCG/ACT inhaler Inhale 2 puffs into the lungs every 6 (six) hours.    . magic mouthwash w/lidocaine SOLN Take 5 mLs by mouth 3 (three) times daily. 315 mL 0  . Magnesium Oxide 500 MG CAPS Take 500 mg by mouth 2 (two) times daily.     . Multiple Vitamins-Minerals (MULTIVITAMIN PO) Take 1 tablet by mouth daily.     . Omega-3 Fatty Acids (FISH OIL PO) Take by mouth.    . oxyCODONE-acetaminophen (PERCOCET) 10-325 MG tablet Take 1 tablet by mouth every 8 (eight) hours as needed for pain. 90 tablet 0  . potassium gluconate 595 MG TABS tablet Take 595 mg by mouth daily.    . SUMAtriptan (IMITREX) 100 MG tablet TAKE 1 TABLET BY MOUTH IF NEEDED FOR MIGRAINES AS DIRECTED 27 tablet 0  . triamcinolone (KENALOG) 0.1 % paste  Use as directed 1 application in the mouth or throat 2 (two) times daily. 5 g 2  . urea (CARMOL) 10 % cream Apply topically as needed. 410 g 1  . valACYclovir (VALTREX) 1000 MG tablet TAKE 1 TABLET(1000 MG) BY MOUTH TWICE DAILY FOR 10 DAYS THEN TAKE 1/2 TABLET(500 MG) BY MOUTH TWICE DAILY 40 tablet 0  . zolpidem (AMBIEN CR) 6.25 MG CR tablet Take 1 tablet (6.25 mg total) by mouth at bedtime. 90 tablet 0   No current facility-administered medications for this visit.    Facility-Administered Medications Ordered in Other Visits  Medication Dose Route Frequency Provider Last Rate Last Dose  . 0.9 %  sodium chloride infusion   Intravenous Once Lloyd Huger, MD      . 0.9 %  sodium chloride infusion   Intravenous Once Lloyd Huger, MD      . dexamethasone (DECADRON) 20 mg in sodium chloride 0.9 % 50 mL IVPB  20 mg Intravenous Once Lloyd Huger, MD      . dexamethasone (DECADRON) injection 10 mg  10 mg Intravenous Once Lloyd Huger, MD      . sodium chloride flush (NS) 0.9 % injection 10 mL  10 mL Intravenous PRN Lloyd Huger, MD   10 mL at 06/16/17 0854    OBJECTIVE: There were no vitals filed for this visit.   There is no height or weight on file to calculate BMI.    ECOG FS:0 - Asymptomatic  Physical Exam  Constitutional: She is oriented to person, place, and time and well-developed, well-nourished, and in no distress. Vital signs are normal.  HENT:  Head: Normocephalic and atraumatic.  Mouth/Throat: Oropharynx is clear and moist and mucous membranes are normal. Oral lesions present.  Oral lesions present on the right side of tongue and buccal lining  Eyes: Pupils are equal, round, and reactive to light.  Neck: Normal range of motion.  Cardiovascular: Normal rate, regular rhythm and normal heart sounds.  No murmur heard. Pulmonary/Chest: Effort normal and breath sounds normal. She has no wheezes.  Abdominal: Soft. Normal appearance and bowel sounds are  normal. She exhibits no distension. There is no tenderness.  Musculoskeletal: Normal range of motion. She exhibits no edema.  Neurological: She is alert and oriented to person, place, and time. Gait normal.  Skin:  Skin is warm and dry. No rash noted.  Skin changes noted to bilateral palms.  Occasionally tender when to no blisters or peeling present.   Psychiatric: Mood, memory, affect and judgment normal.    LAB RESULTS:  Lab Results  Component Value Date   NA 135 09/29/2017   K 3.6 09/29/2017   CL 99 09/29/2017   CO2 27 09/29/2017   GLUCOSE 131 (H) 09/29/2017   BUN 19 09/29/2017   CREATININE 0.88 09/29/2017   CALCIUM 9.2 09/29/2017   PROT 7.2 09/29/2017   ALBUMIN 4.0 09/29/2017   AST 23 09/29/2017   ALT 24 09/29/2017   ALKPHOS 71 09/29/2017   BILITOT 0.5 09/29/2017   GFRNONAA >60 09/29/2017   GFRAA >60 09/29/2017    Lab Results  Component Value Date   WBC 7.6 09/29/2017   NEUTROABS 6.0 09/29/2017   HGB 12.0 09/29/2017   HCT 34.1 (L) 09/29/2017   MCV 99.7 09/29/2017   PLT 213 09/29/2017     STUDIES: No results found.  ASSESSMENT: Stage IIIc high-grade serous ovarian carcinoma  PLAN:  1. Stage IIIc high-grade serous ovarian carcinoma:  She underwent debulking surgery at [facility] on November 19, 2016. She was also initiated on a clinical trial and has received preoperative infusion of Keytruda on November 11, 2016.  Because of patient's persistent peripheral neuropathy as well as pancytopenia, Taxol was discontinued on April 28, 2017.  CT scan from June 19, 2017 is essentially unchanged from previous scan in January. Patient had a pretreatment MUGA on Aug 15, 2017 which revealed an EF 78%.   Ca 125 is pending from today.  Will call patient with results.  Proceed with cycle 3-day 1 of Doxil and Avastin. RTC 2 weeks for further evaluation and consideration of cycle 3-day 15 of Avastin only.  2.  Pain: Stable on narcotic regimen.  Continue 50 mcg fentanyl patch  and oxycodone as needed. Patient requested refill on both and oxycodone/acetaminophen 10-325.  Checked narcotic registry [place] Controlled Substance Reporting System reviewed and refill is appropriate on or after 10/14/17. Medication e-scribed to her pharmacy [facility]) using [employer] 2-step verification process.    NCCSRS reviewed:    3.  Anxiety: Chronic and unchanged.  Continue Xanax as prescribed.  4.  Peripheral neuropathy: Significant.  Bilateral upper and lower extremities.  Continue gabapentin as prescribed.  Patient is not interested in a dose reduction. 5.  Mucositis: Improving per patient. Continue Magic mouthwash, triamcinolone dental paste and baking soda.  If symptoms progress or fail to resolve could try morphine mouthwash 0.2% swish and spit, or topical doxepin oral rinse 0.5%.   Continue Valtrex for prophylaxis of HSV. 6.  Hand-foot syndrome grade 1: Recommend continue urea cream 10%.  Encouraged her to apply cream to affected areas on hands and feet and cover with gloves/socks for  30 minutes BID.   Greater than 50% was spent in counseling and coordination of care with this patient including but not limited to discussion of the relevant topics above (See A&P) including, but not limited to diagnosis and management of acute and chronic medical conditions.   Patient expressed understanding and was in agreement with this plan. She also understands that She can call clinic at any time with any questions, concerns, or complaints.   Cancer Staging Ovarian cancer, unspecified laterality ([place]) Staging form: Ovary, Fallopian Tube, and Primary Peritoneal Carcinoma, AJCC 8th Edition - Clinical: No stage assigned - Unsigned   Jacquelin Hawking, NP   10/13/2017 8:50  AM     

## 2017-10-14 LAB — CA 125: Cancer Antigen (CA) 125: 94 U/mL — ABNORMAL HIGH (ref 0.0–38.1)

## 2017-10-19 ENCOUNTER — Other Ambulatory Visit: Payer: Self-pay | Admitting: Family Medicine

## 2017-10-19 DIAGNOSIS — G43009 Migraine without aura, not intractable, without status migrainosus: Secondary | ICD-10-CM

## 2017-10-19 DIAGNOSIS — G4700 Insomnia, unspecified: Secondary | ICD-10-CM

## 2017-10-22 ENCOUNTER — Telehealth: Payer: Self-pay | Admitting: Family Medicine

## 2017-10-22 ENCOUNTER — Other Ambulatory Visit: Payer: Self-pay | Admitting: Family Medicine

## 2017-10-22 DIAGNOSIS — G4700 Insomnia, unspecified: Secondary | ICD-10-CM

## 2017-10-22 MED ORDER — ZOLPIDEM TARTRATE ER 6.25 MG PO TBCR: 6.2500 mg | EXTENDED_RELEASE_TABLET | Freq: Every day | ORAL | 0 refills | Status: DC

## 2017-10-22 NOTE — Telephone Encounter (Signed)
Sent 30 days

## 2017-10-22 NOTE — Telephone Encounter (Signed)
Pt states she is out of Ambien and would like a refill. Pt has an appt on Tuesday 10/28/17. Pt has been out for 2 days. Please advise.

## 2017-10-23 NOTE — Telephone Encounter (Signed)
Pt informed

## 2017-10-26 NOTE — Progress Notes (Signed)
[place]  [phone] [phone]  ID: Jessica Burgess OB: [birth year]  MR#: [id number]  PJK#:[id number]  Patient Care Team: Steele Sizer, MD as PCP - General (Family Medicine) Lloyd Huger, MD as Consulting Physician (Oncology) Mellody Drown, MD as Consulting Physician (Obstetrics and Gynecology)  CHIEF COMPLAINT: Stage IIIc high-grade serous ovarian carcinoma  INTERVAL HISTORY: Patient returns to clinic today for further evaluation and consideration of cycle 3, day 15 of Doxil plus Avastin.  Avastin only today.  She continues to have a difficult time after her Doxil infusion with mouth sores as well as painful areas on the palms of her hands and soles of her feet.  She states they have mildly improved between treatments. She continues to have significant peripheral neuropathy. She has no other neurologic complaints.  She denies any chest pain, shortness of breath, or cough.  She has a good appetite and denies any nausea, vomiting, constipation, or diarrhea.  She denies any abdominal pain or bloating.  She has no urinary complaints.  Patient offers no further specific complaints today.  REVIEW OF SYSTEMS:   Review of Systems  Constitutional: Negative.  Negative for fever, malaise/fatigue and weight loss.  HENT: Positive for sore throat.   Respiratory: Negative.  Negative for cough and shortness of breath.   Cardiovascular: Negative.  Negative for chest pain and leg swelling.  Gastrointestinal: Negative.  Negative for abdominal pain, blood in stool, constipation, diarrhea, melena, nausea and vomiting.  Genitourinary: Negative.  Negative for dysuria.  Musculoskeletal: Negative.  Negative for back pain, joint pain and neck pain.       Hand and foot pain.  Skin: Negative.  Negative for itching and rash.  Neurological: Positive for tingling and sensory change. Negative for focal weakness and weakness.  Psychiatric/Behavioral: Negative.   The patient is not nervous/anxious.     As per HPI. Otherwise, a complete review of systems is negative.  PAST MEDICAL HISTORY: Past Medical History:  Diagnosis Date  . Allergic rhinitis, cause unspecified   . Anxiety state, unspecified   . Arthritis   . Asthma    only when sick   . Backache, unspecified   . Bronchitis    hx of when get sick  . Cancer ([place])    skin cancer , basal cell   . Cancer ([place]) 11/2016   ovarian  . Cervicalgia   . Complication of anesthesia   . Dermatophytosis of nail   . Dysmetabolic syndrome X   . Encounter for long-term (current) use of other medications   . Esophageal reflux   . Insomnia, unspecified   . Leukocytosis, unspecified   . Migraine without aura, without mention of intractable migraine without mention of status migrainosus   . Other and unspecified hyperlipidemia   . Other malaise and fatigue   . Overweight(278.02)   . Personal history of chemotherapy now   ovarian  . PONV (postoperative nausea and vomiting)   . Spinal stenosis in cervical region   . Symptomatic menopausal or female climacteric states   . Unspecified disorder of skin and subcutaneous tissue   . Unspecified vitamin D deficiency     PAST SURGICAL HISTORY: Past Surgical History:  Procedure Laterality Date  . ABDOMINAL HYSTERECTOMY    . ANTERIOR CERVICAL DECOMP/DISCECTOMY FUSION N/A 06/07/2015   Procedure: Cervical three - four and Cervical six- seven anterior cervical decompression with fusion interbody prosthesis plating and bonegraft;  Surgeon: Newman Pies, MD;  Location: [facility];  Service: Neurosurgery;  Laterality: N/A;  C34 and C67 anterior cervical decompression with fusion interbody prosthesis plating and bonegraft  . BACK SURGERY     x2 Lower   . EVACUATION OF CERVICAL HEMATOMA N/A 06/14/2015   Procedure: EVACUATION OF CERVICAL HEMATOMA;  Surgeon: Newman Pies, MD;  Location: [place];  Service: Neurosurgery;  Laterality: N/A;  . NECK SURGERY      x3  . TONSILLECTOMY      FAMILY HISTORY: Family History  Problem Relation Age of Onset  . Depression Mother   . Migraines Mother   . Dementia Father   . Diabetes Father   . Hyperlipidemia Father   . Hyperlipidemia Brother   . Hyperlipidemia Brother   . Breast cancer Paternal Aunt        40s    ADVANCED DIRECTIVES (Y/N):  N  HEALTH MAINTENANCE: Social History   Tobacco Use  . Smoking status: Former Smoker    Packs/day: 1.50    Years: 20.00    Pack years: 30.00    Types: Cigarettes    Start date: 03/19/1979    Last attempt to quit: 08/25/1999    Years since quitting: 18.1  . Smokeless tobacco: Never Used  . Tobacco comment: smoking cessation materials not required  Substance Use Topics  . Alcohol use: Not Currently    Alcohol/week: 0.0 standard drinks  . Drug use: No     Colonoscopy:  PAP:  Bone density:  Lipid panel:  No Known Allergies  Current Outpatient Medications  Medication Sig Dispense Refill  . albuterol (PROVENTIL HFA;VENTOLIN HFA) 108 (90 Base) MCG/ACT inhaler Inhale 2 puffs into the lungs every 6 (six) hours as needed for wheezing or shortness of breath. 1 Inhaler 2  . albuterol (PROVENTIL) (2.5 MG/3ML) 0.083% nebulizer solution Take 3 mLs (2.5 mg total) by nebulization every 6 (six) hours as needed for wheezing or shortness of breath. 75 mL 2  . ALPRAZolam (XANAX) 0.25 MG tablet Take 1 tablet (0.25 mg total) by mouth 2 (two) times daily as needed for anxiety. 60 tablet 0  . Ascorbic Acid (VITAMIN C PO) Take 1 tablet by mouth daily.     . budesonide-formoterol (SYMBICORT) 160-4.5 MCG/ACT inhaler Inhale 2 puffs into the lungs 2 (two) times daily.    . Cholecalciferol (VITAMIN D-1000 MAX ST) 1000 UNITS tablet Take 1 tablet by mouth 2 (two) times daily.    . Cyanocobalamin (B-12 PO) Take 1 tablet by mouth daily.     . cyclobenzaprine (FLEXERIL) 10 MG tablet TAKE 1 TABLET BY MOUTH THREE TIMES A DAY IF NEEDED FOR MUSCLE SPASM (Patient not taking: Reported  on 10/28/2017) 30 tablet 0  . DULoxetine (CYMBALTA) 60 MG capsule Take 1 capsule (60 mg total) by mouth daily. 90 capsule 0  . estradiol (ESTRACE) 0.5 MG tablet take 1 tablet by mouth once daily 90 tablet 4  . ferrous sulfate (IRON SUPPLEMENT) 325 (65 FE) MG tablet Take 1 tablet by mouth daily.    . fluticasone (FLONASE) 50 MCG/ACT nasal spray Place 2 sprays into both nostrils as needed. 16 g 5  . gabapentin (NEURONTIN) 300 MG capsule Take 1-3 capsules (300-900 mg total) by mouth 3 (three) times daily. 300 mg twice a day and 900 mg at night 540 capsule 1  . ipratropium (ATROVENT HFA) 17 MCG/ACT inhaler Inhale 2 puffs into the lungs every 6 (six) hours.    . Lysine 1000 MG TABS Take by mouth daily.    . magic mouthwash w/lidocaine SOLN Take 5  mLs by mouth 3 (three) times daily. 315 mL 0  . Magnesium Oxide 500 MG CAPS Take 500 mg by mouth 2 (two) times daily.     . Multiple Vitamins-Minerals (MULTIVITAMIN PO) Take 1 tablet by mouth daily.     . Omega-3 Fatty Acids (FISH OIL PO) Take by mouth.    . oxyCODONE-acetaminophen (PERCOCET) 10-325 MG tablet Take 1 tablet by mouth every 8 (eight) hours as needed for pain. 90 tablet 0  . potassium gluconate 595 MG TABS tablet Take 595 mg by mouth daily.    . SUMAtriptan (IMITREX) 100 MG tablet TAKE 1 TABLET BY MOUTH IF NEEDED FOR MIGRAINES AS DIRECTED 27 tablet 0  . triamcinolone (KENALOG) 0.1 % paste Use as directed 1 application in the mouth or throat 2 (two) times daily. 5 g 2  . urea (CARMOL) 10 % cream Apply topically as needed. 410 g 1  . valACYclovir (VALTREX) 1000 MG tablet TAKE 1 TABLET(1000 MG) BY MOUTH TWICE DAILY FOR 10 DAYS THEN TAKE 1/2 TABLET(500 MG) BY MOUTH TWICE DAILY 40 tablet 0  . zolpidem (AMBIEN CR) 6.25 MG CR tablet Take 1 tablet (6.25 mg total) by mouth at bedtime. (Patient not taking: Reported on 10/28/2017) 30 tablet 0  . fentaNYL (DURAGESIC - DOSED MCG/HR) 75 MCG/HR Place 1 patch (75 mcg total) onto the skin every 3 (three) days. 10  patch 0   No current facility-administered medications for this visit.    Facility-Administered Medications Ordered in Other Visits  Medication Dose Route Frequency Provider Last Rate Last Dose  . 0.9 %  sodium chloride infusion   Intravenous Once Lloyd Huger, MD      . 0.9 %  sodium chloride infusion   Intravenous Once Lloyd Huger, MD      . dexamethasone (DECADRON) 20 mg in sodium chloride 0.9 % 50 mL IVPB  20 mg Intravenous Once Lloyd Huger, MD      . dexamethasone (DECADRON) injection 10 mg  10 mg Intravenous Once Lloyd Huger, MD      . sodium chloride flush (NS) 0.9 % injection 10 mL  10 mL Intravenous PRN Lloyd Huger, MD   10 mL at 06/16/17 0854    OBJECTIVE: Vitals:   10/27/17 0959  BP: (!) 156/91  Pulse: 77  Resp: 18  Temp: (!) 97 F (36.1 C)     Body mass index is 27.67 kg/m.    ECOG FS:0 - Asymptomatic  General: Well-developed, well-nourished, no acute distress. Eyes: Pink conjunctiva, anicteric sclera. HEENT: Normocephalic, moist mucous membranes, there is ulcerations and buccal membrane.. Lungs: Clear to auscultation bilaterally. Heart: Regular rate and rhythm. No rubs, murmurs, or gallops. Abdomen: Soft, nontender, nondistended. No organomegaly noted, normoactive bowel sounds. Musculoskeletal: No edema, cyanosis, or clubbing.  Hands and feet erythematous with mild blistering. Neuro: Alert, answering all questions appropriately. Cranial nerves grossly intact. Skin: No rashes or petechiae noted. Psych: Normal affect.  LAB RESULTS:  Lab Results  Component Value Date   NA 136 10/27/2017   K 3.7 10/27/2017   CL 99 10/27/2017   CO2 26 10/27/2017   GLUCOSE 99 10/27/2017   BUN 14 10/27/2017   CREATININE 0.64 10/27/2017   CALCIUM 9.1 10/27/2017   PROT 7.1 10/27/2017   ALBUMIN 3.5 10/27/2017   AST 32 10/27/2017   ALT 42 10/27/2017   ALKPHOS 87 10/27/2017   BILITOT 0.3 10/27/2017   GFRNONAA >60 10/27/2017   GFRAA >60  10/27/2017    Lab  Results  Component Value Date   WBC 3.5 (L) 10/27/2017   NEUTROABS 1.7 10/27/2017   HGB 12.1 10/27/2017   HCT 34.7 (L) 10/27/2017   MCV 100.0 10/27/2017   PLT 201 10/27/2017     STUDIES: No results found.  ASSESSMENT: Stage IIIc high-grade serous ovarian carcinoma  PLAN:  1. Stage IIIc high-grade serous ovarian carcinoma:  She underwent debulking [facility] at [facility] on November 19, 2016. She was also initiated on a clinical trial and has received preoperative infusion of Keytruda on November 11, 2016.  Because of patient's persistent peripheral neuropathy as well as pancytopenia, Taxol was discontinued on April 28, 2017.  CT scan from June 19, 2017 is essentially unchanged from previous scan in January. Patient had a pretreatment MUGA on Aug 15, 2017 which revealed an EF 78%.  Repeat in the next several weeks.  Patient Ca-125 has trended up to 113.  Proceed with cycle 3, day 15 of Doxil was Avastin.  Avastin only today.  Return to clinic in 2 weeks for further evaluation and consideration of cycle 4, day 1.    2. Pain: Worse with hand and foot pain.  Increase fentanyl patch to 75 mcg every 3 days.  Continue oxycodone as needed.  Previously discussed with patient about referring back to PCP for pain management at conclusion of therapy.    3. Anxiety: Chronic and unchanged.  Continue Xanax as prescribed. 4.  Peripheral neuropathy: Continue gabapentin 600 mg 3 times per day. Will consider neurology referral in the future.   5.  Anemia: Patient's hemoglobin continues to be within normal limits.  Monitor. 6.  Leukopenia: Mild, proceed to treatment as above. 7.  Mouth sores: Continue prophylactic Valtrex and Magic mouthwash as needed.  8.  Hand/foot pain: Likely secondary to Doxil.  Discussed at length of discontinuing treatment which patient has declined.  Continue topical creams and increase fentanyl patch as above.  Patient expressed understanding and was in  agreement with this plan. She also understands that She can call clinic at any time with any questions, concerns, or complaints.   Cancer Staging Ovarian cancer, unspecified laterality ([place]) Staging form: Ovary, Fallopian Tube, and Primary Peritoneal Carcinoma, AJCC 8th Edition - Clinical: No stage assigned - Unsigned   Lloyd Huger, MD   10/28/2017 1:35 PM

## 2017-10-27 ENCOUNTER — Other Ambulatory Visit: Payer: Self-pay | Admitting: *Deleted

## 2017-10-27 ENCOUNTER — Inpatient Hospital Stay: Payer: PPO

## 2017-10-27 ENCOUNTER — Inpatient Hospital Stay (HOSPITAL_BASED_OUTPATIENT_CLINIC_OR_DEPARTMENT_OTHER): Payer: PPO | Admitting: Oncology

## 2017-10-27 ENCOUNTER — Inpatient Hospital Stay: Payer: PPO | Attending: Oncology

## 2017-10-27 ENCOUNTER — Encounter: Payer: Self-pay | Admitting: Oncology

## 2017-10-27 VITALS — BP 156/91 | HR 77 | Temp 97.0°F | Resp 18 | Wt 151.3 lb

## 2017-10-27 DIAGNOSIS — Z87891 Personal history of nicotine dependence: Secondary | ICD-10-CM | POA: Insufficient documentation

## 2017-10-27 DIAGNOSIS — E039 Hypothyroidism, unspecified: Secondary | ICD-10-CM | POA: Insufficient documentation

## 2017-10-27 DIAGNOSIS — Z90710 Acquired absence of both cervix and uterus: Secondary | ICD-10-CM | POA: Diagnosis not present

## 2017-10-27 DIAGNOSIS — G620 Drug-induced polyneuropathy: Secondary | ICD-10-CM | POA: Insufficient documentation

## 2017-10-27 DIAGNOSIS — D61818 Other pancytopenia: Secondary | ICD-10-CM | POA: Diagnosis not present

## 2017-10-27 DIAGNOSIS — Z5111 Encounter for antineoplastic chemotherapy: Secondary | ICD-10-CM | POA: Diagnosis not present

## 2017-10-27 DIAGNOSIS — E559 Vitamin D deficiency, unspecified: Secondary | ICD-10-CM | POA: Diagnosis not present

## 2017-10-27 DIAGNOSIS — Z79899 Other long term (current) drug therapy: Secondary | ICD-10-CM | POA: Diagnosis not present

## 2017-10-27 DIAGNOSIS — M79609 Pain in unspecified limb: Secondary | ICD-10-CM | POA: Diagnosis not present

## 2017-10-27 DIAGNOSIS — G8929 Other chronic pain: Secondary | ICD-10-CM | POA: Insufficient documentation

## 2017-10-27 DIAGNOSIS — C569 Malignant neoplasm of unspecified ovary: Secondary | ICD-10-CM

## 2017-10-27 DIAGNOSIS — Z5112 Encounter for antineoplastic immunotherapy: Secondary | ICD-10-CM | POA: Diagnosis not present

## 2017-10-27 DIAGNOSIS — F329 Major depressive disorder, single episode, unspecified: Secondary | ICD-10-CM | POA: Diagnosis not present

## 2017-10-27 LAB — CBC WITH DIFFERENTIAL/PLATELET
BASOS ABS: 0 10*3/uL (ref 0–0.1)
BASOS PCT: 1 %
Eosinophils Absolute: 0.1 10*3/uL (ref 0–0.7)
Eosinophils Relative: 4 %
HEMATOCRIT: 34.7 % — AB (ref 35.0–47.0)
HEMOGLOBIN: 12.1 g/dL (ref 12.0–16.0)
Lymphocytes Relative: 39 %
Lymphs Abs: 1.3 10*3/uL (ref 1.0–3.6)
MCH: 34.9 pg — ABNORMAL HIGH (ref 26.0–34.0)
MCHC: 34.9 g/dL (ref 32.0–36.0)
MCV: 100 fL (ref 80.0–100.0)
MONO ABS: 0.3 10*3/uL (ref 0.2–0.9)
MONOS PCT: 8 %
NEUTROS ABS: 1.7 10*3/uL (ref 1.4–6.5)
NEUTROS PCT: 48 %
Platelets: 201 10*3/uL (ref 150–440)
RBC: 3.47 MIL/uL — ABNORMAL LOW (ref 3.80–5.20)
RDW: 14.4 % (ref 11.5–14.5)
WBC: 3.5 10*3/uL — ABNORMAL LOW (ref 3.6–11.0)

## 2017-10-27 LAB — COMPREHENSIVE METABOLIC PANEL
ALK PHOS: 87 U/L (ref 38–126)
ALT: 42 U/L (ref 0–44)
ANION GAP: 11 (ref 5–15)
AST: 32 U/L (ref 15–41)
Albumin: 3.5 g/dL (ref 3.5–5.0)
BILIRUBIN TOTAL: 0.3 mg/dL (ref 0.3–1.2)
BUN: 14 mg/dL (ref 8–23)
CALCIUM: 9.1 mg/dL (ref 8.9–10.3)
CO2: 26 mmol/L (ref 22–32)
CREATININE: 0.64 mg/dL (ref 0.44–1.00)
Chloride: 99 mmol/L (ref 98–111)
GFR calc non Af Amer: >60 mL/min (ref >60)
Glucose, Bld: 99 mg/dL (ref 70–99)
Potassium: 3.7 mmol/L (ref 3.5–5.1)
Sodium: 136 mmol/L (ref 135–145)
Total Protein: 7.1 g/dL (ref 6.5–8.1)

## 2017-10-27 LAB — PROTEIN, URINE, RANDOM: TOTAL PROTEIN, URINE: 7 mg/dL

## 2017-10-27 MED ORDER — SODIUM CHLORIDE 0.9 % IV SOLN
Freq: Once | INTRAVENOUS | Status: AC
  Administered 2017-10-27: 11:00:00 via INTRAVENOUS
  Filled 2017-10-27: qty 1000

## 2017-10-27 MED ORDER — SODIUM CHLORIDE 0.9% FLUSH
10.0000 mL | Freq: Once | INTRAVENOUS | Status: AC
  Administered 2017-10-27: 10 mL via INTRAVENOUS
  Filled 2017-10-27: qty 10

## 2017-10-27 MED ORDER — SODIUM CHLORIDE 0.9 % IV SOLN: 10.0000 mg | Freq: Once | INTRAVENOUS | Status: DC

## 2017-10-27 MED ORDER — FENTANYL 75 MCG/HR TD PT72: 75.0000 ug | MEDICATED_PATCH | TRANSDERMAL | 0 refills | Status: DC

## 2017-10-27 MED ORDER — DEXAMETHASONE SODIUM PHOSPHATE 10 MG/ML IJ SOLN
10.0000 mg | Freq: Once | INTRAMUSCULAR | Status: AC
  Administered 2017-10-27: 10 mg via INTRAVENOUS
  Filled 2017-10-27: qty 1

## 2017-10-27 MED ORDER — SODIUM CHLORIDE 0.9 % IV SOLN
700.0000 mg | Freq: Once | INTRAVENOUS | Status: AC
  Administered 2017-10-27: 700 mg via INTRAVENOUS
  Filled 2017-10-27: qty 16

## 2017-10-27 MED ORDER — MORPHINE SULFATE 2 MG/ML IJ SOLN
2.0000 mg | Freq: Once | INTRAMUSCULAR | Status: AC
  Administered 2017-10-27: 2 mg via INTRAVENOUS
  Filled 2017-10-27 (×2): qty 1

## 2017-10-27 MED ORDER — HEPARIN SOD (PORK) LOCK FLUSH 100 UNIT/ML IV SOLN
500.0000 [IU] | Freq: Once | INTRAVENOUS | Status: AC
  Administered 2017-10-27: 500 [IU] via INTRAVENOUS
  Filled 2017-10-27: qty 5

## 2017-10-27 MED ORDER — MORPHINE SULFATE 2 MG/ML IJ SOLN
2.0000 mg | Freq: Once | INTRAMUSCULAR | Status: AC
  Administered 2017-10-27: 2 mg via INTRAVENOUS
  Filled 2017-10-27: qty 1

## 2017-10-27 NOTE — Progress Notes (Signed)
Nutrition Assessment   Reason for Assessment:   Patient identified on Malnutrition Screening tool for weight loss and poor appetite.   ASSESSMENT:   62 year old female with stage III ovarian cancer. Patient s/p debulking surgery at [facility] on November 19, 2016.  Patient undergoing chemotherapy of Doxil and avastin.  Patient with blisters on bottom of feet, hands and mouth.  Appetite is poor.  Painful mouth sores are keeping patient from eating.  Reports ate egg and cheese biscuit on Saturday, ate ice cream last night.  Has not eaten anything today so far. Has not tried oral nutrition supplement shakes yet.  Reports cold foods are working better than hot foods.  Has been drinking pedialyte.  Carbonation burns her mouth.     Nutrition Focused Physical Exam: deferred, patient in pain   Medications: vit c, Vit D, Vit B 12, fesulfate, magic mouthwash, MVI, omega 3   Labs: reviewed   Anthropometrics:   Height: 62 inches Weight: 151 lb 4.8 oz Noted weight 6/18 161 lb BMI: 27  6% weight loss in the last 2 months   Estimated Energy Needs  Kcals: 0973-5329 kcals/d Protein: 85-102 g/d Fluid: 2 L/d   NUTRITION DIAGNOSIS: Inadequate oral intake related to mucositis, pain as evidenced by 6% weight loss in 2 months and poor po intake   MALNUTRITION DIAGNOSIS: continue to monitor   INTERVENTION:  Discussed oral nutrition supplements.  Multiple samples given today of different supplements for patient to try.   Discussed hydration and encouraged patient to continue with pedialyte, also discussed ensure rapid hydration with patient as well as option.   Discussed strategies to help with sore mouth. Fact sheet given.   Contact information given.   Per RN, Tillie Rung, pain medication is going to be adjusted today.  Also discussed concern for patient needing IV fluids later in the week with RN.  She will follow-up.   MONITORING, EVALUATION, GOAL: Patient will consume adequate calories and  protein to maintain weight during treatment   Next Visit: August 26 during infusion  Shatera Rennert B. Zenia Resides, [place], [place] Registered Dietitian [phone] (pager)

## 2017-10-27 NOTE — Progress Notes (Signed)
Patient still complaining of pain 10/10 after receiving 2mg  Morphine and 10mg  Decadron IV.

## 2017-10-27 NOTE — Progress Notes (Signed)
Patient reports mouth sores, soreness and blisters to hands and feet.

## 2017-10-28 ENCOUNTER — Ambulatory Visit (INDEPENDENT_AMBULATORY_CARE_PROVIDER_SITE_OTHER): Payer: PPO | Admitting: Family Medicine

## 2017-10-28 ENCOUNTER — Encounter: Payer: Self-pay | Admitting: Family Medicine

## 2017-10-28 VITALS — BP 138/78 | HR 94 | Temp 97.9°F | Resp 16 | Ht 62.0 in | Wt 150.5 lb

## 2017-10-28 DIAGNOSIS — G43009 Migraine without aura, not intractable, without status migrainosus: Secondary | ICD-10-CM

## 2017-10-28 DIAGNOSIS — I158 Other secondary hypertension: Secondary | ICD-10-CM

## 2017-10-28 DIAGNOSIS — I10 Essential (primary) hypertension: Secondary | ICD-10-CM | POA: Insufficient documentation

## 2017-10-28 DIAGNOSIS — T50905S Adverse effect of unspecified drugs, medicaments and biological substances, sequela: Secondary | ICD-10-CM | POA: Diagnosis not present

## 2017-10-28 DIAGNOSIS — T451X5A Adverse effect of antineoplastic and immunosuppressive drugs, initial encounter: Secondary | ICD-10-CM

## 2017-10-28 DIAGNOSIS — E2839 Other primary ovarian failure: Secondary | ICD-10-CM

## 2017-10-28 DIAGNOSIS — E039 Hypothyroidism, unspecified: Secondary | ICD-10-CM

## 2017-10-28 DIAGNOSIS — G620 Drug-induced polyneuropathy: Secondary | ICD-10-CM | POA: Diagnosis not present

## 2017-10-28 DIAGNOSIS — G4700 Insomnia, unspecified: Secondary | ICD-10-CM | POA: Diagnosis not present

## 2017-10-28 DIAGNOSIS — F329 Major depressive disorder, single episode, unspecified: Secondary | ICD-10-CM | POA: Diagnosis not present

## 2017-10-28 DIAGNOSIS — T50905A Adverse effect of unspecified drugs, medicaments and biological substances, initial encounter: Secondary | ICD-10-CM | POA: Diagnosis not present

## 2017-10-28 DIAGNOSIS — C569 Malignant neoplasm of unspecified ovary: Secondary | ICD-10-CM

## 2017-10-28 LAB — CA 125: CANCER ANTIGEN (CA) 125: 113 U/mL — AB (ref 0.0–38.1)

## 2017-10-28 MED ORDER — ESTRADIOL 0.5 MG PO TABS: 0.5000 mg | ORAL_TABLET | Freq: Every day | ORAL | 4 refills | Status: DC

## 2017-10-28 MED ORDER — SUMATRIPTAN SUCCINATE 100 MG PO TABS: ORAL_TABLET | ORAL | 0 refills | Status: DC

## 2017-10-28 MED ORDER — DULOXETINE HCL 60 MG PO CPEP: 60.0000 mg | ORAL_CAPSULE | Freq: Every day | ORAL | 0 refills | Status: DC

## 2017-10-28 MED ORDER — LISINOPRIL 20 MG PO TABS: 20.0000 mg | ORAL_TABLET | Freq: Every day | ORAL | 0 refills | Status: DC

## 2017-10-28 MED ORDER — TEMAZEPAM 15 MG PO CAPS: 15.0000 mg | ORAL_CAPSULE | Freq: Every evening | ORAL | 0 refills | Status: DC | PRN

## 2017-10-28 NOTE — Progress Notes (Signed)
Name: Jessica Burgess   MRN: [id number]    DOB: 03/02/1956   Date:10/28/2017       Progress Note  Subjective  Chief Complaint  Chief Complaint  Patient presents with  . Follow-up    3 month F/U  . Hypothyroidism  . Depression  . Metabolic Syndrome  . Back Pain  . Migraine  . Blister    All over her body and mouth  . Insomnia    Ambien was going to cost her $70 for a 30 day supply unable to afford    HPI  Ovarian cancer: history of hysterectomy for fibroid many years ago, developed acute left lower abdominal pain and saw her GI and CT showed abnormal omentum, started first round of chemo 11/2016 that cause pancytopenia and also neuropathy, now on high dose neurontin, CA 125 has gone up and started a second round of chemotherapy Spring of 2019, she is struggling with multiple side effects, BP going up, skin and mucosa blistering and severe pain for the past month.  Chronic back pain; previous history of back surgery, still has daily pain. Took Fentanyl patch years ago but caused worsening of migrainesat the time, however since placed back on medication by hematologist and because pain is higher from neuropathy and cancer therapy, and dose was increased today from 50 to 75 mcg.  She has been taking Percocet for many years. Explained risk of concomitant use of Ambien, BZD and Flexeril and shewasoff BZD and Flexeril.   Insomnia: takesShe was  doing well on Ambien CR however too expensive and would like to go back on Temazepam  Depression Major : sheis stable at this time, she had a set back shortly after cancer diagnosis, she is back on Duloxetine, she states Prozac causes her not to be able to cry and states doing okay at this time, she wants to beat the cancer   Metabolic Syndrome/Prediabetes: hgbA1Cwasdown, she is on Metformin. She denies polyphagia, polyuria or polydipsia. Losing weight because of chemotherapy that is causing mouth sores   Cervical spine stenosis and  radiculitis: s/p neck ( last surgery Dr. Arnoldo Morale March 2017 ) surgery was zero at times, but can flare, sometimes radiates to her head and causes headaches.   Migraine headache: 12 episodes a month, taking Gabapentin, she states having to take sumatripan to help with symptoms. She does not any other referrals at this time. Just trying to get rid of ovarian cancer   Paresthesia: she states first round  chemo is causing severe numbness on hands and feet, on higher dose of gabapentin and is stable.  Hypothyroidism: she is off  Medication ,last TSH was at goal. Dry scan is stable.  Patient Active Problem List   Diagnosis Date Noted  . Mucositis due to chemotherapy 09/02/2017  . Goals of care, counseling/discussion 08/08/2017  . Hypothyroidism due to medication 07/28/2017  . Genetic testing 12/18/2016  . Migraines 12/01/2016  . Postoperative seroma of subcutaneous tissue after non-dermatologic procedure 12/01/2016  . Transaminitis 12/01/2016  . Anemia associated with acute blood loss 11/20/2016  . Ovarian cancer, unspecified laterality ([place]) 11/12/2016  . 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[place]) 11/05/2016  . Examination of participant in clinical trial 11/01/2016  . Chronic right-sided low back pain with right-sided sciatica 02/02/2016  . History of neck surgery 08/01/2015  . Hematoma 06/14/2015  . Perennial allergic rhinitis 05/05/2015  . Generalized anxiety disorder 02/03/2015  . Back pain, chronic 08/25/2014  . Insomnia, persistent 08/25/2014  .  Chronic cervical pain 08/25/2014  . Major depression, chronic ([place]) 08/25/2014  . Dyslipidemia 08/25/2014  . Gastro-esophageal reflux disease without esophagitis 08/25/2014  . H/O high risk medication treatment 08/25/2014  . Blood glucose elevated 08/25/2014  . Migraine without aura and without status migrainosus, not intractable 08/25/2014  . Climacteric 08/25/2014  . Dysmetabolic syndrome 97/67/3419  . Fungal infection  of toenail 08/25/2014  . Obesity (BMI 30.0-34.9) 08/25/2014  . Vitamin D deficiency 08/25/2014  . Engages in travel abroad 08/25/2014  . Cervical disc disorder with radiculopathy 04/28/2013    Past Surgical History:  Procedure Laterality Date  . ABDOMINAL HYSTERECTOMY    . ANTERIOR CERVICAL DECOMP/DISCECTOMY FUSION N/A 06/07/2015   Procedure: Cervical three - four and Cervical six- seven anterior cervical decompression with fusion interbody prosthesis plating and bonegraft;  Surgeon: Newman Pies, MD;  Location: [facility];  Service: Neurosurgery;  Laterality: N/A;  C34 and C67 anterior cervical decompression with fusion interbody prosthesis plating and bonegraft  . BACK SURGERY     x2 Lower   . EVACUATION OF CERVICAL HEMATOMA N/A 06/14/2015   Procedure: EVACUATION OF CERVICAL HEMATOMA;  Surgeon: Newman Pies, MD;  Location: [facility];  Service: Neurosurgery;  Laterality: N/A;  . NECK SURGERY     x3  . TONSILLECTOMY      Family History  Problem Relation Age of Onset  . Depression Mother   . Migraines Mother   . Dementia Father   . Diabetes Father   . Hyperlipidemia Father   . Hyperlipidemia Brother   . Hyperlipidemia Brother   . Breast cancer Paternal Aunt        39s    Social History   Socioeconomic History  . Marital status: Single    Spouse name: Not on file  . Number of children: 0  . Years of education: some college  . Highest education level: 12th grade  Occupational History    Employer: DISABLED  . Occupation: Disabled   Social Needs  . Financial resource strain: Very hard  . Food insecurity:    Worry: Never true    Inability: Never true  . Transportation needs:    Medical: No    Non-medical: No  Tobacco Use  . Smoking status: Former Smoker    Packs/day: 1.50    Years: 20.00    Pack years: 30.00    Types: Cigarettes    Start date: 03/19/1979    Last attempt to quit: 08/25/1999    Years since quitting: 18.1  . Smokeless tobacco: Never Used  .  Tobacco comment: smoking cessation materials not required  Substance and Sexual Activity  . Alcohol use: Not Currently    Alcohol/week: 0.0 standard drinks  . Drug use: No  . Sexual activity: Never  Lifestyle  . Physical activity:    Days per week: 0 days    Minutes per session: 0 min  . Stress: Very much  Relationships  . Social connections:    Talks on phone: Patient refused    Gets together: Patient refused    Attends religious service: Patient refused    Active member of club or organization: Patient refused    Attends meetings of clubs or organizations: Patient refused    Relationship status: Patient refused  . Intimate partner violence:    Fear of current or ex partner: No    Emotionally abused: No    Physically abused: No    Forced sexual activity: No  Other Topics Concern  . Not  on file  Social History Narrative   Patient is single.    Patient lives with roommates.    Patient on disability    Patient has no children.    Patient has some college      Current Outpatient Medications:  .  albuterol (PROVENTIL HFA;VENTOLIN HFA) 108 (90 Base) MCG/ACT inhaler, Inhale 2 puffs into the lungs every 6 (six) hours as needed for wheezing or shortness of breath., Disp: 1 Inhaler, Rfl: 2 .  albuterol (PROVENTIL) (2.5 MG/3ML) 0.083% nebulizer solution, Take 3 mLs (2.5 mg total) by nebulization every 6 (six) hours as needed for wheezing or shortness of breath., Disp: 75 mL, Rfl: 2 .  ALPRAZolam (XANAX) 0.25 MG tablet, Take 1 tablet (0.25 mg total) by mouth 2 (two) times daily as needed for anxiety., Disp: 60 tablet, Rfl: 0 .  Ascorbic Acid (VITAMIN C PO), Take 1 tablet by mouth daily. , Disp: , Rfl:  .  budesonide-formoterol (SYMBICORT) 160-4.5 MCG/ACT inhaler, Inhale 2 puffs into the lungs 2 (two) times daily., Disp: , Rfl:  .  Cholecalciferol (VITAMIN D-1000 MAX ST) 1000 UNITS tablet, Take 1 tablet by mouth 2 (two) times daily., Disp: , Rfl:  .  Cyanocobalamin (B-12 PO), Take 1  tablet by mouth daily. , Disp: , Rfl:  .  DULoxetine (CYMBALTA) 60 MG capsule, Take 1 capsule (60 mg total) by mouth daily., Disp: 90 capsule, Rfl: 0 .  estradiol (ESTRACE) 0.5 MG tablet, take 1 tablet by mouth once daily, Disp: 90 tablet, Rfl: 4 .  fentaNYL (DURAGESIC - DOSED MCG/HR) 75 MCG/HR, Place 1 patch (75 mcg total) onto the skin every 3 (three) days., Disp: 10 patch, Rfl: 0 .  ferrous sulfate (IRON SUPPLEMENT) 325 (65 FE) MG tablet, Take 1 tablet by mouth daily., Disp: , Rfl:  .  fluticasone (FLONASE) 50 MCG/ACT nasal spray, Place 2 sprays into both nostrils as needed., Disp: 16 g, Rfl: 5 .  gabapentin (NEURONTIN) 300 MG capsule, Take 1-3 capsules (300-900 mg total) by mouth 3 (three) times daily. 300 mg twice a day and 900 mg at night, Disp: 540 capsule, Rfl: 1 .  ipratropium (ATROVENT HFA) 17 MCG/ACT inhaler, Inhale 2 puffs into the lungs every 6 (six) hours., Disp: , Rfl:  .  Lysine 1000 MG TABS, Take by mouth daily., Disp: , Rfl:  .  magic mouthwash w/lidocaine SOLN, Take 5 mLs by mouth 3 (three) times daily., Disp: 315 mL, Rfl: 0 .  Magnesium Oxide 500 MG CAPS, Take 500 mg by mouth 2 (two) times daily. , Disp: , Rfl:  .  Multiple Vitamins-Minerals (MULTIVITAMIN PO), Take 1 tablet by mouth daily. , Disp: , Rfl:  .  Omega-3 Fatty Acids (FISH OIL PO), Take by mouth., Disp: , Rfl:  .  oxyCODONE-acetaminophen (PERCOCET) 10-325 MG tablet, Take 1 tablet by mouth every 8 (eight) hours as needed for pain., Disp: 90 tablet, Rfl: 0 .  potassium gluconate 595 MG TABS tablet, Take 595 mg by mouth daily., Disp: , Rfl:  .  SUMAtriptan (IMITREX) 100 MG tablet, TAKE 1 TABLET BY MOUTH IF NEEDED FOR MIGRAINES AS DIRECTED, Disp: 27 tablet, Rfl: 0 .  triamcinolone (KENALOG) 0.1 % paste, Use as directed 1 application in the mouth or throat 2 (two) times daily., Disp: 5 g, Rfl: 2 .  urea (CARMOL) 10 % cream, Apply topically as needed., Disp: 410 g, Rfl: 1 .  valACYclovir (VALTREX) 1000 MG tablet, TAKE 1  TABLET(1000 MG) BY MOUTH TWICE DAILY FOR 10  DAYS THEN TAKE 1/2 TABLET(500 MG) BY MOUTH TWICE DAILY, Disp: 40 tablet, Rfl: 0 .  cyclobenzaprine (FLEXERIL) 10 MG tablet, TAKE 1 TABLET BY MOUTH THREE TIMES A DAY IF NEEDED FOR MUSCLE SPASM (Patient not taking: Reported on 10/28/2017), Disp: 30 tablet, Rfl: 0 .  zolpidem (AMBIEN CR) 6.25 MG CR tablet, Take 1 tablet (6.25 mg total) by mouth at bedtime. (Patient not taking: Reported on 10/28/2017), Disp: 30 tablet, Rfl: 0 No current facility-administered medications for this visit.   Facility-Administered Medications Ordered in Other Visits:  .  0.9 %  sodium chloride infusion, , Intravenous, Once, Finnegan, Kathlene November, MD .  0.9 %  sodium chloride infusion, , Intravenous, Once, Grayland Ormond, Kathlene November, MD .  dexamethasone (DECADRON) 20 mg in sodium chloride 0.9 % 50 mL IVPB, 20 mg, Intravenous, Once, Grayland Ormond, Kathlene November, MD .  dexamethasone (DECADRON) injection 10 mg, 10 mg, Intravenous, Once, Grayland Ormond, Kathlene November, MD .  sodium chloride flush (NS) 0.9 % injection 10 mL, 10 mL, Intravenous, PRN, Lloyd Huger, MD, 10 mL at 06/16/17 0854  No Known Allergies   ROS  Constitutional: Negative for fever, positive for  weight change - mouth has sores and unable to eat.  Respiratory: Negative for cough and shortness of breath.   Cardiovascular: Negative for chest pain or palpitations.  Gastrointestinal: Negative for abdominal pain, no bowel changes.  Musculoskeletal: Positive  for gait problem ( from feet sores) but no  joint swelling.  Skin: Negative for rash.  Neurological: Negative for dizziness , positive for intermittent  headache.  No other specific complaints in a complete review of systems (except as listed in HPI above).  Objective  Vitals:   10/28/17 1313  BP: (!) 168/100  Pulse: 94  Resp: 16  Temp: 97.9 F (36.6 C)  TempSrc: Oral  SpO2: 98%  Weight: 150 lb 8 oz (68.3 kg)  Height: '5\' 2"'  (1.575 m)    Body mass index is 27.53  kg/m.  Physical Exam  Constitutional: Patient appears well-developed and well-nourished. Overweight. No distress.  HEENT: head atraumatic, normocephalic, pupils equal and reactive to light, oneck supple, throat within normal limits Cardiovascular: Normal rate, regular rhythm and normal heart sounds.  No murmur heard. No BLE edema. Pulmonary/Chest: Effort normal and breath sounds normal. No respiratory distress. Abdominal: Soft.  There is no tenderness. Skin: ulceration on oral mucosa, cracks and fissures on hands from blisters that ruptures, some blister on both feet, very tender Psychiatric: Patient has a normal mood and affect. behavior is normal. Judgment and thought content normal.  Recent Results (from the past 2160 hour(s))  Comprehensive metabolic panel     Status: Abnormal   Collection Time: 07/31/17  2:14 PM  Result Value Ref Range   Sodium 135 135 - 145 mmol/L   Potassium 3.7 3.5 - 5.1 mmol/L   Chloride 97 (L) 101 - 111 mmol/L   CO2 28 22 - 32 mmol/L   Glucose, Bld 106 (H) 65 - 99 mg/dL   BUN 14 6 - 20 mg/dL   Creatinine, Ser 0.70 0.44 - 1.00 mg/dL   Calcium 8.9 8.9 - 10.3 mg/dL   Total Protein 7.0 6.5 - 8.1 g/dL   Albumin 3.9 3.5 - 5.0 g/dL   AST 50 (H) 15 - 41 U/L   ALT 48 14 - 54 U/L   Alkaline Phosphatase 91 38 - 126 U/L   Total Bilirubin 0.7 0.3 - 1.2 mg/dL   GFR calc non Af Amer >60 >60 mL/min  GFR calc Af Amer >60 >60 mL/min    Comment: (NOTE) The eGFR has been calculated using the CKD EPI equation. This calculation has not been validated in all clinical situations. eGFR's persistently <60 mL/min signify possible Chronic Kidney Disease.    Anion gap 10 5 - 15    Comment: Performed at [facility], [address] [place], [place] [address]  CBC with Differential     Status: Abnormal   Collection Time: 07/31/17  2:14 PM  Result Value Ref Range   WBC 3.6 3.6 - 11.0 K/uL   RBC 3.07 (L) 3.80 - 5.20 MIL/uL   Hemoglobin 10.8 (L) 12.0 - 16.0 g/dL   HCT  30.8 (L) 35.0 - 47.0 %   MCV 100.2 (H) 80.0 - 100.0 fL   MCH 35.0 (H) 26.0 - 34.0 pg   MCHC 35.0 32.0 - 36.0 g/dL   RDW 13.8 11.5 - 14.5 %   Platelets 121 (L) 150 - 440 K/uL   Neutrophils Relative % 38 %   Neutro Abs 1.4 1.4 - 6.5 K/uL   Lymphocytes Relative 49 %   Lymphs Abs 1.8 1.0 - 3.6 K/uL   Monocytes Relative 9 %   Monocytes Absolute 0.3 0.2 - 0.9 K/uL   Eosinophils Relative 3 %   Eosinophils Absolute 0.1 0 - 0.7 K/uL   Basophils Relative 1 %   Basophils Absolute 0.0 0 - 0.1 K/uL    Comment: Performed at [facility], [place], [place] [address]  CA 125     Status: Abnormal   Collection Time: 07/31/17  2:14 PM  Result Value Ref Range   Cancer Antigen (CA) 125 179.7 (H) 0.0 - 38.1 U/mL    Comment: (NOTE) Roche Diagnostics Electrochemiluminescence Immunoassay (ECLIA) Values obtained with different assay methods or kits cannot be used interchangeably.  Results cannot be interpreted as absolute evidence of the presence or absence of malignant disease. Performed At: [facility] [place], [place] [id number] Rush Farmer MD [id number] Performed at [facility], [address] [place], [place] [address]   CBC with Differential     Status: Abnormal   Collection Time: 08/18/17  8:35 AM  Result Value Ref Range   WBC 3.0 (L) 3.6 - 11.0 K/uL   RBC 2.77 (L) 3.80 - 5.20 MIL/uL   Hemoglobin 10.1 (L) 12.0 - 16.0 g/dL   HCT 28.1 (L) 35.0 - 47.0 %   MCV 101.1 (H) 80.0 - 100.0 fL   MCH 36.5 (H) 26.0 - 34.0 pg   MCHC 36.1 (H) 32.0 - 36.0 g/dL   RDW 16.0 (H) 11.5 - 14.5 %   Platelets 200 150 - 440 K/uL   Neutrophils Relative % 42 %   Neutro Abs 1.3 (L) 1.4 - 6.5 K/uL   Lymphocytes Relative 43 %   Lymphs Abs 1.3 1.0 - 3.6 K/uL   Monocytes Relative 12 %   Monocytes Absolute 0.4 0.2 - 0.9 K/uL   Eosinophils Relative 2 %   Eosinophils Absolute 0.1 0 - 0.7 K/uL   Basophils Relative 1 %   Basophils Absolute 0.0 0 - 0.1 K/uL     Comment: Performed at [facility], [address] [place], [place] [address]  Comprehensive metabolic panel     Status: Abnormal   Collection Time: 08/18/17  8:35 AM  Result Value Ref Range   Sodium 137 135 - 145 mmol/L   Potassium 3.7 3.5 - 5.1 mmol/L   Chloride 100 (  L) 101 - 111 mmol/L   CO2 27 22 - 32 mmol/L   Glucose, Bld 104 (H) 65 - 99 mg/dL   BUN 16 6 - 20 mg/dL   Creatinine, Ser 0.82 0.44 - 1.00 mg/dL   Calcium 9.0 8.9 - 10.3 mg/dL   Total Protein 7.1 6.5 - 8.1 g/dL   Albumin 3.9 3.5 - 5.0 g/dL   AST 32 15 - 41 U/L   ALT 21 14 - 54 U/L   Alkaline Phosphatase 70 38 - 126 U/L   Total Bilirubin 0.5 0.3 - 1.2 mg/dL   GFR calc non Af Amer >60 >60 mL/min   GFR calc Af Amer >60 >60 mL/min    Comment: (NOTE) The eGFR has been calculated using the CKD EPI equation. This calculation has not been validated in all clinical situations. eGFR's persistently <60 mL/min signify possible Chronic Kidney Disease.    Anion gap 10 5 - 15    Comment: Performed at [facility], [address] [place], [place] [address]  Protein, urine, random     Status: None   Collection Time: 08/18/17 10:12 AM  Result Value Ref Range   Total Protein, Urine <6 mg/dL    Comment: NO NORMAL RANGE ESTABLISHED FOR THIS TEST Performed at [facility], [address] [place], [place] [address]   Comprehensive metabolic panel     Status: Abnormal   Collection Time: 08/25/17 10:34 AM  Result Value Ref Range   Sodium 136 135 - 145 mmol/L   Potassium 4.3 3.5 - 5.1 mmol/L   Chloride 99 (L) 101 - 111 mmol/L   CO2 28 22 - 32 mmol/L   Glucose, Bld 120 (H) 65 - 99 mg/dL   BUN 13 6 - 20 mg/dL   Creatinine, Ser 0.69 0.44 - 1.00 mg/dL   Calcium 9.1 8.9 - 10.3 mg/dL   Total Protein 7.0 6.5 - 8.1 g/dL   Albumin 4.0 3.5 - 5.0 g/dL   AST 23 15 - 41 U/L   ALT 16 14 - 54 U/L   Alkaline Phosphatase 74 38 - 126 U/L   Total Bilirubin 0.6 0.3 - 1.2 mg/dL   GFR calc non Af Amer >60 >60 mL/min    GFR calc Af Amer >60 >60 mL/min    Comment: (NOTE) The eGFR has been calculated using the CKD EPI equation. This calculation has not been validated in all clinical situations. eGFR's persistently <60 mL/min signify possible Chronic Kidney Disease.    Anion gap 9 5 - 15    Comment: Performed at [facility], [place],  [address]  CBC with Differential     Status: Abnormal   Collection Time: 08/25/17 10:34 AM  Result Value Ref Range   WBC 2.8 (L) 3.6 - 11.0 K/uL   RBC 3.19 (L) 3.80 - 5.20 MIL/uL   Hemoglobin 11.4 (L) 12.0 - 16.0 g/dL   HCT 32.3 (L) 35.0 - 47.0 %   MCV 101.0 (H) 80.0 - 100.0 fL   MCH 35.7 (H) 26.0 - 34.0 pg   MCHC 35.3 32.0 - 36.0 g/dL   RDW 16.1 (H) 11.5 - 14.5 %   Platelets 292 150 - 440 K/uL   Neutrophils Relative % 37 %   Neutro Abs 1.0 (L) 1.4 - 6.5 K/uL   Lymphocytes Relative 50 %   Lymphs Abs 1.4 1.0 - 3.6 K/uL   Monocytes Relative 10 %   Monocytes Absolute 0.3 0.2 - 0.9 K/uL  Eosinophils Relative 2 %   Eosinophils Absolute 0.1 0 - 0.7 K/uL   Basophils Relative 1 %   Basophils Absolute 0.0 0 - 0.1 K/uL    Comment: Performed at [facility], [address] [place], [place] [address]  Comprehensive metabolic panel     Status: Abnormal   Collection Time: 09/01/17 10:02 AM  Result Value Ref Range   Sodium 136 135 - 145 mmol/L   Potassium 3.7 3.5 - 5.1 mmol/L   Chloride 101 101 - 111 mmol/L   CO2 28 22 - 32 mmol/L   Glucose, Bld 140 (H) 65 - 99 mg/dL   BUN 14 6 - 20 mg/dL   Creatinine, Ser 0.80 0.44 - 1.00 mg/dL   Calcium 9.1 8.9 - 10.3 mg/dL   Total Protein 6.5 6.5 - 8.1 g/dL   Albumin 3.5 3.5 - 5.0 g/dL   AST 24 15 - 41 U/L   ALT 19 14 - 54 U/L   Alkaline Phosphatase 73 38 - 126 U/L   Total Bilirubin 0.4 0.3 - 1.2 mg/dL   GFR calc non Af Amer >60 >60 mL/min   GFR calc Af Amer >60 >60 mL/min    Comment: (NOTE) The eGFR has been calculated using the CKD EPI equation. This calculation has not been validated in  all clinical situations. eGFR's persistently <60 mL/min signify possible Chronic Kidney Disease.    Anion gap 7 5 - 15    Comment: Performed at [facility], [address] [place], [place] [address]  CBC with Differential     Status: Abnormal   Collection Time: 09/01/17 10:02 AM  Result Value Ref Range   WBC 3.5 (L) 3.6 - 11.0 K/uL   RBC 2.87 (L) 3.80 - 5.20 MIL/uL   Hemoglobin 10.5 (L) 12.0 - 16.0 g/dL   HCT 29.1 (L) 35.0 - 47.0 %   MCV 101.2 (H) 80.0 - 100.0 fL   MCH 36.4 (H) 26.0 - 34.0 pg   MCHC 35.9 32.0 - 36.0 g/dL   RDW 15.7 (H) 11.5 - 14.5 %   Platelets 238 150 - 440 K/uL   Neutrophils Relative % 55 %   Neutro Abs 1.9 1.4 - 6.5 K/uL   Lymphocytes Relative 35 %   Lymphs Abs 1.2 1.0 - 3.6 K/uL   Monocytes Relative 8 %   Monocytes Absolute 0.3 0.2 - 0.9 K/uL   Eosinophils Relative 1 %   Eosinophils Absolute 0.0 0 - 0.7 K/uL   Basophils Relative 1 %   Basophils Absolute 0.0 0 - 0.1 K/uL    Comment: Performed at [facility], [address] [place], [place] [address]  CA 125     Status: Abnormal   Collection Time: 09/01/17 10:02 AM  Result Value Ref Range   Cancer Antigen (CA) 125 148.7 (H) 0.0 - 38.1 U/mL    Comment: (NOTE) Roche [employer]ostics Electrochemiluminescence Immunoassay (ECLIA) Values obtained with different assay methods or kits cannot be used interchangeably.  Results cannot be interpreted as absolute evidence of the presence or absence of malignant disease. Performed At: [facility] [place], [place] [id number] Rush Farmer MD [id number] Performed at [facility], [address] [place], [place] [address]   Comprehensive metabolic panel     Status: Abnormal   Collection Time: 09/15/17  8:35 AM  Result Value Ref Range   Sodium 138 135 - 145 mmol/L   Potassium 3.8 3.5 - 5.1 mmol/L   Chloride 99 98 -  111 mmol/L    Comment: Please note change in reference range.   CO2 28 22 - 32 mmol/L   Glucose, Bld  96 70 - 99 mg/dL    Comment: Please note change in reference range.   BUN 16 8 - 23 mg/dL    Comment: Please note change in reference range.   Creatinine, Ser 0.98 0.44 - 1.00 mg/dL   Calcium 8.9 8.9 - 10.3 mg/dL   Total Protein 6.7 6.5 - 8.1 g/dL   Albumin 3.9 3.5 - 5.0 g/dL   AST 22 15 - 41 U/L   ALT 15 0 - 44 U/L    Comment: Please note change in reference range.   Alkaline Phosphatase 79 38 - 126 U/L   Total Bilirubin 0.2 (L) 0.3 - 1.2 mg/dL   GFR calc non Af Amer >60 >60 mL/min   GFR calc Af Amer >60 >60 mL/min    Comment: (NOTE) The eGFR has been calculated using the CKD EPI equation. This calculation has not been validated in all clinical situations. eGFR's persistently <60 mL/min signify possible Chronic Kidney Disease.    Anion gap 11 5 - 15    Comment: Performed at [facility], [address] [place], [place] [address]  CBC with Differential     Status: Abnormal   Collection Time: 09/15/17  8:35 AM  Result Value Ref Range   WBC 4.6 3.6 - 11.0 K/uL   RBC 3.30 (L) 3.80 - 5.20 MIL/uL   Hemoglobin 11.8 (L) 12.0 - 16.0 g/dL   HCT 33.0 (L) 35.0 - 47.0 %   MCV 100.0 80.0 - 100.0 fL   MCH 35.8 (H) 26.0 - 34.0 pg   MCHC 35.8 32.0 - 36.0 g/dL   RDW 15.1 (H) 11.5 - 14.5 %   Platelets 238 150 - 440 K/uL   Neutrophils Relative % 40 %   Neutro Abs 1.8 1.4 - 6.5 K/uL   Lymphocytes Relative 41 %   Lymphs Abs 1.9 1.0 - 3.6 K/uL   Monocytes Relative 14 %   Monocytes Absolute 0.7 0.2 - 0.9 K/uL   Eosinophils Relative 4 %   Eosinophils Absolute 0.2 0 - 0.7 K/uL   Basophils Relative 1 %   Basophils Absolute 0.0 0 - 0.1 K/uL    Comment: Performed at [facility], [place], [place] [address]  CA 125     Status: Abnormal   Collection Time: 09/15/17  8:35 AM  Result Value Ref Range   Cancer Antigen (CA) 125 124.1 (H) 0.0 - 38.1 U/mL    Comment: (NOTE) Roche [employer]ostics Electrochemiluminescence Immunoassay (ECLIA) Values obtained with different assay  methods or kits cannot be used interchangeably.  Results cannot be interpreted as absolute evidence of the presence or absence of malignant disease. Performed At: [facility] [place], [place] [id number] Rush Farmer MD OF:[id number] Performed at [facility], [address] [place], [place] [address]   Comprehensive metabolic panel     Status: Abnormal   Collection Time: 09/29/17  9:46 AM  Result Value Ref Range   Sodium 135 135 - 145 mmol/L   Potassium 3.6 3.5 - 5.1 mmol/L   Chloride 99 98 - 111 mmol/L    Comment: Please note change in reference range.   CO2 27 22 - 32 mmol/L   Glucose, Bld 131 (H) 70 - 99 mg/dL    Comment: Please note change in reference range.   BUN 19 8 - 23  mg/dL    Comment: Please note change in reference range.   Creatinine, Ser 0.88 0.44 - 1.00 mg/dL   Calcium 9.2 8.9 - 10.3 mg/dL   Total Protein 7.2 6.5 - 8.1 g/dL   Albumin 4.0 3.5 - 5.0 g/dL   AST 23 15 - 41 U/L   ALT 24 0 - 44 U/L    Comment: Please note change in reference range.   Alkaline Phosphatase 71 38 - 126 U/L   Total Bilirubin 0.5 0.3 - 1.2 mg/dL   GFR calc non Af Amer >60 >60 mL/min   GFR calc Af Amer >60 >60 mL/min    Comment: (NOTE) The eGFR has been calculated using the CKD EPI equation. This calculation has not been validated in all clinical situations. eGFR's persistently <60 mL/min signify possible Chronic Kidney Disease.    Anion gap 9 5 - 15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

## 2017-10-29 ENCOUNTER — Inpatient Hospital Stay (HOSPITAL_BASED_OUTPATIENT_CLINIC_OR_DEPARTMENT_OTHER): Payer: PPO | Admitting: Obstetrics and Gynecology

## 2017-10-29 ENCOUNTER — Telehealth: Payer: Self-pay

## 2017-10-29 VITALS — BP 121/79 | HR 74 | Temp 96.7°F | Resp 18 | Ht 62.0 in | Wt 150.0 lb

## 2017-10-29 DIAGNOSIS — Z90710 Acquired absence of both cervix and uterus: Secondary | ICD-10-CM

## 2017-10-29 DIAGNOSIS — Z5112 Encounter for antineoplastic immunotherapy: Secondary | ICD-10-CM | POA: Diagnosis not present

## 2017-10-29 DIAGNOSIS — C569 Malignant neoplasm of unspecified ovary: Secondary | ICD-10-CM

## 2017-10-29 NOTE — Progress Notes (Signed)
Gynecologic Oncology Consult Visit   Referring Provider: Dr Grayland Ormond  Chief Concern: stage IIIC ovarian cancer  Subjective:  Jessica Burgess is a 62 y.o. female who is seen in consultation for advanced ovarian cancer.  Cycle 3, day 15 of Doxil was Avastin earlier this week.  CA125 trending down somewhat over the past 3 months and now 113. Has had mucositis, hand/foot and peripheral neuropathy, anorexia, vaginal discharge with retained cervix.  She is very eager to continue to treat aggressively.   History Jessica Burgess initially presented to Dr. Grayland Ormond on 10/21/16 with several month history of LLQ pain.    10/25/16:   CA-125 of 1,902; CEA of 2.2 (N), repeat CA125 3,257.  10/25/16:  CTAP -- Omental caking, greatest within the pelvis, highly suspicious for metastatic disease. No definite primary tumor identified. Possible tiny peritoneal nodules. Small amount of free pelvic fluid.  11/05/16: Diagnostic laparoscopy with biopsies.  Fagotti score 4. Path revealed metastatic high grade serous carcinoma. Decision was made to proceed with [employer] study and primary debulking surgery.  11/11/16: Pre-surgical dose of Pembrolizumab 263m IV  11/19/16: Exploratory laparotomy, bilateral salpingo-oopherectomy, omentectomy, tumor debulking, liver moblization, and argon beam ablation of tumor nodules on the right diaphragm and posterior cul-de-sac.  At the completion of the case, optimal debulking was achieved with all tumor nodules less than 1 cm in diameter.  Final pathology confirmed high grade serous adenocarcinoma involving the omentum, bilateral tubes, pelvic peritoneum, sigmoid nodule.  STIC in the right fallopian tube.   12/01/2016 Post op course complicated by abdominal wall seroma.  CT shows new moderate amount of free fluid within the pelvis. There is no loculated or encapsulated fluid collection. There are multiple small fluid collections in the anterior subcutaneous tissues. Representative  areas measure 2.0 x 5.5 cm (series 2, image 71) and 2.2 x 3.4 cm (series 2, image 60).   Wound separation on 12/01/16 in ER_wet to dry dressing_healing via secondary intention.   9/20/2018_mediport placed   She was also initiated on a clinical trial and has received preoperative infusion of Keytruda on November 11, 2016 and then started carbo/taxol.  Because of patient's persistent peripheral neuropathy as well as pancytopenia, Taxol was discontinued on April 28, 2017.  CT scan from June 19, 2017 essentially unchanged from previous scan in January with some residual carcinomatosis and CA125 still elevated. Stopped after 9 cycles in May (last 2 cycles with single agent Carbo).  Then switched to Doxil/Avastin in June 2019.  Genetic Risk Assessment:  Genetic testing with [facility] risk negative.  Problem List: Patient Active Problem List   Diagnosis Date Noted  . 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 Mucositis due to chemotherapy 09/02/2017  . Goals of care, counseling/discussion 08/08/2017  . Hypothyroidism due to medication 07/28/2017  . Genetic testing 12/18/2016  . Migraines 12/01/2016  . Postoperative seroma of subcutaneous tissue after non-dermatologic procedure 12/01/2016  . Transaminitis 12/01/2016  . Anemia associated with acute blood loss 11/20/2016  . Ovarian cancer, unspecified laterality ([place] 11/12/2016  . 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[facility] 11/05/2016  . Examination of participant in clinical trial 11/01/2016  . Chronic right-sided low back pain with right-sided sciatica 02/02/2016  . History of neck surgery 08/01/2015  . Hematoma 06/14/2015  . Perennial allergic rhinitis 05/05/2015  . Generalized anxiety disorder 02/03/2015  . Back pain, chronic 08/25/2014  . Insomnia, persistent 08/25/2014  . Chronic cervical pain 08/25/2014  . Major depression, chronic ([facility] 08/25/2014  . Dyslipidemia  08/25/2014  . Gastro-esophageal reflux disease without  esophagitis 08/25/2014  . H/O high risk medication treatment 08/25/2014  . Blood glucose elevated 08/25/2014  . Migraine without aura and without status migrainosus, not intractable 08/25/2014  . Climacteric 08/25/2014  . Dysmetabolic syndrome 37/29/0211  . Fungal infection of toenail 08/25/2014  . Obesity (BMI 30.0-34.9) 08/25/2014  . Vitamin D deficiency 08/25/2014  . Engages in travel abroad 08/25/2014  . Cervical disc disorder with radiculopathy 04/28/2013   Past Medical History: Past Medical History:  Diagnosis Date  . Allergic rhinitis, cause unspecified   . Anxiety state, unspecified   . Arthritis   . Asthma    only when sick   . Backache, unspecified   . Bronchitis    hx of when get sick  . Cancer ([place])    skin cancer , basal cell   . Cancer ([place]) 11/2016   ovarian  . Cervicalgia   . Complication of anesthesia   . Dermatophytosis of nail   . Dysmetabolic syndrome X   . Encounter for long-term (current) use of other medications   . Esophageal reflux   . Insomnia, unspecified   . Leukocytosis, unspecified   . Migraine without aura, without mention of intractable migraine without mention of status migrainosus   . Other and unspecified hyperlipidemia   . Other malaise and fatigue   . Overweight(278.02)   . Personal history of chemotherapy now   ovarian  . PONV (postoperative nausea and vomiting)   . Spinal stenosis in cervical region   . Symptomatic menopausal or female climacteric states   . Unspecified disorder of skin and subcutaneous tissue   . Unspecified vitamin D deficiency     Past Surgical History: Past Surgical History:  Procedure Laterality Date  . ABDOMINAL HYSTERECTOMY    . ANTERIOR CERVICAL DECOMP/DISCECTOMY FUSION N/A 06/07/2015   Procedure: Cervical three - four and Cervical six- seven anterior cervical decompression with fusion interbody prosthesis plating and bonegraft;  Surgeon: Newman Pies, MD;  Location: [facility];  Service:  Neurosurgery;  Laterality: N/A;  C34 and C67 anterior cervical decompression with fusion interbody prosthesis plating and bonegraft  . BACK SURGERY     x2 Lower   . EVACUATION OF CERVICAL HEMATOMA N/A 06/14/2015   Procedure: EVACUATION OF CERVICAL HEMATOMA;  Surgeon: Newman Pies, MD;  Location: [facility];  Service: Neurosurgery;  Laterality: N/A;  . NECK SURGERY     x3  . TONSILLECTOMY      OB History:  OB History  Gravida Para Term Preterm AB Living  0            SAB TAB Ectopic Multiple Live Births              Family History: Family History  Problem Relation Age of Onset  . Depression Mother   . Migraines Mother   . Dementia Father   . Diabetes Father   . Hyperlipidemia Father   . Hyperlipidemia Brother   . Hyperlipidemia Brother   . Breast cancer Paternal Aunt        75s    Social History: Social History   Socioeconomic History  . Marital status: Single    Spouse name: Not on file  . Number of children: 0  . Years of education: some college  . Highest education level: 12th grade  Occupational History    Employer: DISABLED  . Occupation: Disabled   Social Needs  . Financial resource strain: Very hard  . Food insecurity:  Worry: Never true    Inability: Never true  . Transportation needs:    Medical: No    Non-medical: No  Tobacco Use  . Smoking status: Former Smoker    Packs/day: 1.50    Years: 20.00    Pack years: 30.00    Types: Cigarettes    Start date: 03/19/1979    Last attempt to quit: 08/25/1999    Years since quitting: 18.1  . Smokeless tobacco: Never Used  . Tobacco comment: smoking cessation materials not required  Substance and Sexual Activity  . Alcohol use: Not Currently    Alcohol/week: 0.0 standard drinks  . Drug use: No  . Sexual activity: Never  Lifestyle  . Physical activity:    Days per week: 0 days    Minutes per session: 0 min  . Stress: Very much  Relationships  . Social connections:    Talks on phone: Patient  refused    Gets together: Patient refused    Attends religious service: Patient refused    Active member of club or organization: Patient refused    Attends meetings of clubs or organizations: Patient refused    Relationship status: Patient refused  . Intimate partner violence:    Fear of current or ex partner: No    Emotionally abused: No    Physically abused: No    Forced sexual activity: No  Other Topics Concern  . Not on file  Social History Narrative   Patient is single.    Patient lives with roommates.    Patient on disability    Patient has no children.    Patient has some college     Allergies: No Known Allergies  Current Medications: Current Outpatient Medications  Medication Sig Dispense Refill  . albuterol (PROVENTIL HFA;VENTOLIN HFA) 108 (90 Base) MCG/ACT inhaler Inhale 2 puffs into the lungs every 6 (six) hours as needed for wheezing or shortness of breath. 1 Inhaler 2  . albuterol (PROVENTIL) (2.5 MG/3ML) 0.083% nebulizer solution Take 3 mLs (2.5 mg total) by nebulization every 6 (six) hours as needed for wheezing or shortness of breath. 75 mL 2  . Ascorbic Acid (VITAMIN C PO) Take 1 tablet by mouth daily.     . budesonide-formoterol (SYMBICORT) 160-4.5 MCG/ACT inhaler Inhale 2 puffs into the lungs 2 (two) times daily.    . Cholecalciferol (VITAMIN D-1000 MAX ST) 1000 UNITS tablet Take 1 tablet by mouth 2 (two) times daily.    . Cyanocobalamin (B-12 PO) Take 1 tablet by mouth daily.     . cyclobenzaprine (FLEXERIL) 10 MG tablet TAKE 1 TABLET BY MOUTH THREE TIMES A DAY IF NEEDED FOR MUSCLE SPASM 30 tablet 0  . DULoxetine (CYMBALTA) 60 MG capsule Take 1 capsule (60 mg total) by mouth daily. 90 capsule 0  . estradiol (ESTRACE) 0.5 MG tablet Take 1 tablet (0.5 mg total) by mouth daily. 90 tablet 4  . fentaNYL (DURAGESIC - DOSED MCG/HR) 75 MCG/HR Place 1 patch (75 mcg total) onto the skin every 3 (three) days. 10 patch 0  . ferrous sulfate (IRON SUPPLEMENT) 325 (65 FE)  MG tablet Take 1 tablet by mouth daily.    . fluticasone (FLONASE) 50 MCG/ACT nasal spray Place 2 sprays into both nostrils as needed. 16 g 5  . gabapentin (NEURONTIN) 300 MG capsule Take 1-3 capsules (300-900 mg total) by mouth 3 (three) times daily. 300 mg twice a day and 900 mg at night (Patient taking differently: Take 900 mg by mouth 2 (two)  times daily. 300 mg twice a day and 900 mg at night) 540 capsule 1  . ipratropium (ATROVENT HFA) 17 MCG/ACT inhaler Inhale 2 puffs into the lungs every 6 (six) hours as needed.     Marland Kitchen lisinopril (PRINIVIL,ZESTRIL) 20 MG tablet Take 1 tablet (20 mg total) by mouth daily. 30 tablet 0  . Lysine 1000 MG TABS Take 1 tablet by mouth daily.     . magic mouthwash w/lidocaine SOLN Take 5 mLs by mouth 3 (three) times daily. 315 mL 0  . Magnesium Oxide 500 MG CAPS Take 500 mg by mouth 2 (two) times daily.     . Multiple Vitamins-Minerals (MULTIVITAMIN PO) Take 1 tablet by mouth daily.     . Omega-3 Fatty Acids (FISH OIL PO) Take 1 capsule by mouth 2 (two) times daily.     Marland Kitchen oxyCODONE-acetaminophen (PERCOCET) 10-325 MG tablet Take 1 tablet by mouth every 8 (eight) hours as needed for pain. 90 tablet 0  . potassium gluconate 595 MG TABS tablet Take 595 mg by mouth daily.    . SUMAtriptan (IMITREX) 100 MG tablet May repeat in 2 hours if headache persists or recurs. 27 tablet 0  . temazepam (RESTORIL) 15 MG capsule Take 1 capsule (15 mg total) by mouth at bedtime as needed for sleep. 30 capsule 0  . triamcinolone (KENALOG) 0.1 % paste Use as directed 1 application in the mouth or throat 2 (two) times daily. 5 g 2  . urea (CARMOL) 10 % cream Apply topically as needed. 410 g 1  . valACYclovir (VALTREX) 1000 MG tablet TAKE 1 TABLET(1000 MG) BY MOUTH TWICE DAILY FOR 10 DAYS THEN TAKE 1/2 TABLET(500 MG) BY MOUTH TWICE DAILY (Patient taking differently: TAKE 1 TABLET(1000 MG) BY MOUTH TWICE DAILY FOR 10 DAYS THEN TAKE 1/2 TABLET(500 MG) BY MOUTH TWICE DAILY, only when she had  outbreaks) 40 tablet 0   No current facility-administered medications for this visit.    Facility-Administered Medications Ordered in Other Visits  Medication Dose Route Frequency Provider Last Rate Last Dose  . 0.9 %  sodium chloride infusion   Intravenous Once Lloyd Huger, MD      . 0.9 %  sodium chloride infusion   Intravenous Once Lloyd Huger, MD      . dexamethasone (DECADRON) 20 mg in sodium chloride 0.9 % 50 mL IVPB  20 mg Intravenous Once Lloyd Huger, MD      . dexamethasone (DECADRON) injection 10 mg  10 mg Intravenous Once Lloyd Huger, MD      . sodium chloride flush (NS) 0.9 % injection 10 mL  10 mL Intravenous PRN Lloyd Huger, MD   10 mL at 06/16/17 6314    Review of Systems Per interval history  Objective:  Physical Examination:  BP 121/79   Pulse 74   Temp (!) 96.7 F (35.9 C) (Tympanic)   Resp 18   Ht 5' 2" (1.575 m)   Wt 150 lb (68 kg)   BMI 27.44 kg/m   ECOG Performance Status: 1 - Symptomatic but completely ambulatory  General appearance: alert, cooperative and appears stated age HEENT:neck supple with midline trachea and thyroid without masses Lymph node survey: non-palpable, axillary, inguinal, supraclavicular Cardiovascular: regular rate and rhythm, no murmurs or gallops Respiratory: normal air entry, lungs clear to auscultation and no rales, rhonchi or wheezing Breast exam: not examined. Abdomen: soft, non-tender, without masses or organomegaly, no hernias and well healed incision Back: inspection of back is  normal Extremities: extremities normal, atraumatic, no cyanosis or edema Skin exam - normal coloration and turgor, no rashes, no suspicious skin lesions noted. Neurological exam reveals alert, oriented, normal speech, no focal findings or movement disorder noted.  Pelvic: exam chaperoned by nurse;  Vulva: normal appearing vulva with no masses, tenderness or lesions; Vagina: normal vagina no discharge;  Cervix:  normal, Bimanua/RV: normal    Assessment:  Jessica Burgess is a 62 y.o. female diagnosed with IIIC high grade serous ovarian cancer, s/p diagnostic laparoscopy and omental biopsy on 11/05/16. Single infusion of Pembrolizumab 242m 11/11/16 on [employer] trial.   Optimal tumor debulking 11/19/16 and then [facility]([place].  Had persistent disease based on CT scan and CA125 at completion of primary therapy.  Transitioned to doxil/avastin and has completed 3 cycles with down trending CA125, but still 113.    [facility].  Medical co-morbidities complicating care: hyperlipidemia, extensive spinal surgery, anxiety/depression  .  Plan:   Problem List Items Addressed This Visit      Genitourinary   Ovarian cancer, unspecified laterality ([place]     We discussed that stage III ovarian cancer is not usually curable and that the goad is extending life with the best possible quality.  It is unlikely that she will have a complete response to the current regimen based on CGU/YQ034 but would probably recommend she take a treatment holiday after 6 cycles, since she is having significant symptoms.  We can see her back at that time to discuss further.  In the meantime she will continue treatment with doxil/avastin with Dr FGrayland Ormond     AMellody Drown MD   CC:  SSteele Sizer MSun Village1[address] Lafayette StreetSWilkinBPleasant Hill [place] [phone]

## 2017-10-29 NOTE — Telephone Encounter (Signed)
Copied from [place] [id number]. Topic: General - Other >> Oct 29, 2017 10:10 AM Yvette Rack wrote: Reason for CRM: Carren Rang with [facility] request pt office visit notes from 02/14/14 - 02/15/15. Fax# [phone].

## 2017-10-29 NOTE — Progress Notes (Signed)
Still with vaginal discharge and it is clear and sometimes yellow and it is all the time, and it gets in her underwear and driver her crazy

## 2017-10-31 ENCOUNTER — Other Ambulatory Visit: Payer: Self-pay | Admitting: Family Medicine

## 2017-10-31 DIAGNOSIS — F329 Major depressive disorder, single episode, unspecified: Secondary | ICD-10-CM

## 2017-11-05 ENCOUNTER — Ambulatory Visit
Admission: RE | Admit: 2017-11-05 | Discharge: 2017-11-05 | Disposition: A | Discharge status: Home / Self Care | Payer: PPO | Source: Ambulatory Visit | Attending: Oncology | Admitting: Oncology

## 2017-11-05 DIAGNOSIS — C569 Malignant neoplasm of unspecified ovary: Secondary | ICD-10-CM | POA: Diagnosis not present

## 2017-11-05 DIAGNOSIS — Z79899 Other long term (current) drug therapy: Secondary | ICD-10-CM | POA: Diagnosis not present

## 2017-11-05 DIAGNOSIS — Z5111 Encounter for antineoplastic chemotherapy: Secondary | ICD-10-CM | POA: Diagnosis not present

## 2017-11-05 MED ORDER — TECHNETIUM TC 99M-LABELED RED BLOOD CELLS IV KIT
21.5780 | PACK | Freq: Once | INTRAVENOUS | Status: AC | PRN
  Administered 2017-11-05: 21.578 via INTRAVENOUS

## 2017-11-10 ENCOUNTER — Other Ambulatory Visit: Payer: Self-pay

## 2017-11-10 ENCOUNTER — Inpatient Hospital Stay: Payer: PPO

## 2017-11-10 ENCOUNTER — Inpatient Hospital Stay (HOSPITAL_BASED_OUTPATIENT_CLINIC_OR_DEPARTMENT_OTHER): Payer: PPO | Admitting: Oncology

## 2017-11-10 ENCOUNTER — Telehealth: Payer: Self-pay | Admitting: Family Medicine

## 2017-11-10 VITALS — BP 146/82 | HR 82 | Temp 96.1°F | Resp 18 | Wt 150.8 lb

## 2017-11-10 DIAGNOSIS — Z79899 Other long term (current) drug therapy: Secondary | ICD-10-CM | POA: Diagnosis not present

## 2017-11-10 DIAGNOSIS — Z5112 Encounter for antineoplastic immunotherapy: Secondary | ICD-10-CM | POA: Diagnosis not present

## 2017-11-10 DIAGNOSIS — G8929 Other chronic pain: Secondary | ICD-10-CM

## 2017-11-10 DIAGNOSIS — C569 Malignant neoplasm of unspecified ovary: Secondary | ICD-10-CM

## 2017-11-10 DIAGNOSIS — F419 Anxiety disorder, unspecified: Secondary | ICD-10-CM | POA: Diagnosis not present

## 2017-11-10 DIAGNOSIS — R509 Fever, unspecified: Secondary | ICD-10-CM | POA: Diagnosis not present

## 2017-11-10 DIAGNOSIS — G629 Polyneuropathy, unspecified: Secondary | ICD-10-CM | POA: Diagnosis not present

## 2017-11-10 LAB — CBC WITH DIFFERENTIAL/PLATELET
Basophils Absolute: 0 10*3/uL (ref 0–0.1)
Basophils Relative: 1 %
EOS PCT: 3 %
Eosinophils Absolute: 0.1 10*3/uL (ref 0–0.7)
HCT: 34 % — ABNORMAL LOW (ref 35.0–47.0)
Hemoglobin: 11.9 g/dL — ABNORMAL LOW (ref 12.0–16.0)
LYMPHS ABS: 0.8 10*3/uL — AB (ref 1.0–3.6)
LYMPHS PCT: 18 %
MCH: 34.9 pg — ABNORMAL HIGH (ref 26.0–34.0)
MCHC: 35.1 g/dL (ref 32.0–36.0)
MCV: 99.6 fL (ref 80.0–100.0)
MONO ABS: 0.5 10*3/uL (ref 0.2–0.9)
MONOS PCT: 13 %
Neutro Abs: 2.8 10*3/uL (ref 1.4–6.5)
Neutrophils Relative %: 65 %
PLATELETS: 248 10*3/uL (ref 150–440)
RBC: 3.41 MIL/uL — ABNORMAL LOW (ref 3.80–5.20)
RDW: 13.7 % (ref 11.5–14.5)
WBC: 4.3 10*3/uL (ref 3.6–11.0)

## 2017-11-10 LAB — COMPREHENSIVE METABOLIC PANEL
ALBUMIN: 3.4 g/dL — AB (ref 3.5–5.0)
ALT: 21 U/L (ref 0–44)
AST: 24 U/L (ref 15–41)
Alkaline Phosphatase: 77 U/L (ref 38–126)
Anion gap: 11 (ref 5–15)
BILIRUBIN TOTAL: 0.4 mg/dL (ref 0.3–1.2)
BUN: 15 mg/dL (ref 8–23)
CHLORIDE: 99 mmol/L (ref 98–111)
CO2: 26 mmol/L (ref 22–32)
Calcium: 9.2 mg/dL (ref 8.9–10.3)
Creatinine, Ser: 0.82 mg/dL (ref 0.44–1.00)
GFR calc Af Amer: >60 mL/min (ref >60)
GFR calc non Af Amer: >60 mL/min (ref >60)
GLUCOSE: 152 mg/dL — AB (ref 70–99)
Potassium: 4 mmol/L (ref 3.5–5.1)
Sodium: 136 mmol/L (ref 135–145)
TOTAL PROTEIN: 7.4 g/dL (ref 6.5–8.1)

## 2017-11-10 LAB — PROTEIN, URINE, RANDOM: Total Protein, Urine: 37 mg/dL

## 2017-11-10 MED ORDER — DEXTROSE 5 % IV SOLN
Freq: Once | INTRAVENOUS | Status: AC
  Administered 2017-11-10: 11:00:00 via INTRAVENOUS
  Filled 2017-11-10: qty 250

## 2017-11-10 MED ORDER — SODIUM CHLORIDE 0.9 % IV SOLN
700.0000 mg | Freq: Once | INTRAVENOUS | Status: AC
  Administered 2017-11-10: 700 mg via INTRAVENOUS
  Filled 2017-11-10: qty 16

## 2017-11-10 MED ORDER — SODIUM CHLORIDE 0.9% FLUSH
10.0000 mL | INTRAVENOUS | Status: DC | PRN
  Administered 2017-11-10: 10 mL via INTRAVENOUS
  Filled 2017-11-10: qty 10

## 2017-11-10 MED ORDER — SODIUM CHLORIDE 0.9 % IV SOLN
10.0000 mg | Freq: Once | INTRAVENOUS | Status: DC
  Filled 2017-11-10: qty 1

## 2017-11-10 MED ORDER — HEPARIN SOD (PORK) LOCK FLUSH 100 UNIT/ML IV SOLN
500.0000 [IU] | Freq: Once | INTRAVENOUS | Status: AC
  Administered 2017-11-10: 500 [IU] via INTRAVENOUS
  Filled 2017-11-10: qty 5

## 2017-11-10 MED ORDER — SODIUM CHLORIDE 0.9 % IV SOLN
Freq: Once | INTRAVENOUS | Status: AC
  Administered 2017-11-10: 11:00:00 via INTRAVENOUS
  Filled 2017-11-10: qty 250

## 2017-11-10 MED ORDER — DOXORUBICIN HCL LIPOSOMAL CHEMO INJECTION 2 MG/ML
70.0000 mg | Freq: Once | INTRAVENOUS | Status: AC
  Administered 2017-11-10: 70 mg via INTRAVENOUS
  Filled 2017-11-10: qty 25

## 2017-11-10 MED ORDER — DEXAMETHASONE SODIUM PHOSPHATE 10 MG/ML IJ SOLN
10.0000 mg | Freq: Once | INTRAMUSCULAR | Status: AC
  Administered 2017-11-10: 10 mg via INTRAVENOUS
  Filled 2017-11-10: qty 1

## 2017-11-10 NOTE — Progress Notes (Signed)
[place]  [phone] [phone]  ID: Jessica Burgess OB: Sep 15, 1955  MR#: [id number]  FWY#:[id number]  Patient Care Team: Steele Sizer, MD as PCP - General (Family Medicine) Lloyd Huger, MD as Consulting Physician (Oncology) Mellody Drown, MD as Consulting Physician (Obstetrics and Gynecology)  CHIEF COMPLAINT: Stage IIIc high-grade serous ovarian carcinoma  INTERVAL HISTORY: Patient returns to clinic today for further evaluation and consideration of cycle 4, day 1 of Doxil plus Avastin.  She continues to have a difficult time after her Doxil infusion with mouth sores as well as painful areas on the palms of her hands and soles of her feet.  These have improved over the past week.  She also reports low-grade persistent fevers without source of infection.  She continues to have significant peripheral neuropathy. She has no other neurologic complaints.  She denies any chest pain, shortness of breath, or cough.  She has a good appetite and denies any nausea, vomiting, constipation, or diarrhea.  She denies any abdominal pain or bloating.  She has no urinary complaints.  Patient offers no further specific complaints today.  REVIEW OF SYSTEMS:   Review of Systems  Constitutional: Negative.  Negative for fever, malaise/fatigue and weight loss.  HENT: Negative.  Negative for sore throat.   Respiratory: Negative.  Negative for cough and shortness of breath.   Cardiovascular: Negative.  Negative for chest pain and leg swelling.  Gastrointestinal: Negative.  Negative for abdominal pain, blood in stool, constipation, diarrhea, melena, nausea and vomiting.  Genitourinary: Negative.  Negative for dysuria.  Musculoskeletal: Negative.  Negative for back pain, joint pain and neck pain.       Hand and foot pain.  Skin: Negative.  Negative for itching and rash.  Neurological: Positive for tingling and sensory change. Negative for focal weakness and  weakness.  Psychiatric/Behavioral: Negative.  The patient is not nervous/anxious.     As per HPI. Otherwise, a complete review of systems is negative.  PAST MEDICAL HISTORY: Past Medical History:  Diagnosis Date  . Allergic rhinitis, cause unspecified   . Anxiety state, unspecified   . Arthritis   . Asthma    only when sick   . Backache, unspecified   . Bronchitis    hx of when get sick  . Cancer ([place])    skin cancer , basal cell   . Cancer ([place]) 11/2016   ovarian  . Cervicalgia   . Complication of anesthesia   . Dermatophytosis of nail   . Dysmetabolic syndrome X   . Encounter for long-term (current) use of other medications   . Esophageal reflux   . Insomnia, unspecified   . Leukocytosis, unspecified   . Migraine without aura, without mention of intractable migraine without mention of status migrainosus   . Other and unspecified hyperlipidemia   . Other malaise and fatigue   . Overweight(278.02)   . Personal history of chemotherapy now   ovarian  . PONV (postoperative nausea and vomiting)   . Spinal stenosis in cervical region   . Symptomatic menopausal or female climacteric states   . Unspecified disorder of skin and subcutaneous tissue   . Unspecified vitamin D deficiency     PAST SURGICAL HISTORY: Past Surgical History:  Procedure Laterality Date  . ABDOMINAL HYSTERECTOMY    . ANTERIOR CERVICAL DECOMP/DISCECTOMY FUSION N/A 06/07/2015   Procedure: Cervical three - four and Cervical six- seven anterior cervical decompression with fusion interbody prosthesis plating and bonegraft;  Surgeon: Newman Pies,  MD;  Location: [facility];  Service: Neurosurgery;  Laterality: N/A;  C34 and C67 anterior cervical decompression with fusion interbody prosthesis plating and bonegraft  . BACK SURGERY     x2 Lower   . EVACUATION OF CERVICAL HEMATOMA N/A 06/14/2015   Procedure: EVACUATION OF CERVICAL HEMATOMA;  Surgeon: Newman Pies, MD;  Location: [facility];  Service:  Neurosurgery;  Laterality: N/A;  . NECK SURGERY     x3  . TONSILLECTOMY      FAMILY HISTORY: Family History  Problem Relation Age of Onset  . Depression Mother   . Migraines Mother   . Dementia Father   . Diabetes Father   . Hyperlipidemia Father   . Hyperlipidemia Brother   . Hyperlipidemia Brother   . Breast cancer Paternal Aunt        39s    ADVANCED DIRECTIVES (Y/N):  N  HEALTH MAINTENANCE: Social History   Tobacco Use  . Smoking status: Former Smoker    Packs/day: 1.50    Years: 20.00    Pack years: 30.00    Types: Cigarettes    Start date: 03/19/1979    Last attempt to quit: 08/25/1999    Years since quitting: 18.2  . Smokeless tobacco: Never Used  . Tobacco comment: smoking cessation materials not required  Substance Use Topics  . Alcohol use: Not Currently    Alcohol/week: 0.0 standard drinks  . Drug use: No     Colonoscopy:  PAP:  Bone density:  Lipid panel:  No Known Allergies  Current Outpatient Medications  Medication Sig Dispense Refill  . Ascorbic Acid (VITAMIN C PO) Take 1 tablet by mouth daily.     . Cholecalciferol (VITAMIN D-1000 MAX ST) 1000 UNITS tablet Take 1 tablet by mouth 2 (two) times daily.    . Cyanocobalamin (B-12 PO) Take 1 tablet by mouth daily.     . DULoxetine (CYMBALTA) 60 MG capsule Take 1 capsule (60 mg total) by mouth daily. 90 capsule 0  . estradiol (ESTRACE) 0.5 MG tablet Take 1 tablet (0.5 mg total) by mouth daily. 90 tablet 4  . fentaNYL (DURAGESIC - DOSED MCG/HR) 75 MCG/HR Place 1 patch (75 mcg total) onto the skin every 3 (three) days. (Patient taking differently: Place 50 mcg onto the skin every 3 (three) days. ) 10 patch 0  . ferrous sulfate (IRON SUPPLEMENT) 325 (65 FE) MG tablet Take 1 tablet by mouth daily.    Marland Kitchen gabapentin (NEURONTIN) 300 MG capsule Take 1-3 capsules (300-900 mg total) by mouth 3 (three) times daily. 300 mg twice a day and 900 mg at night (Patient taking differently: Take 900 mg by mouth 2 (two)  times daily. 300 mg twice a day and 900 mg at night) 540 capsule 1  . lisinopril (PRINIVIL,ZESTRIL) 20 MG tablet Take 1 tablet (20 mg total) by mouth daily. 30 tablet 0  . magic mouthwash w/lidocaine SOLN Take 5 mLs by mouth 3 (three) times daily. 315 mL 0  . Magnesium Oxide 500 MG CAPS Take 500 mg by mouth 2 (two) times daily.     . Multiple Vitamins-Minerals (MULTIVITAMIN PO) Take 1 tablet by mouth daily.     . Omega-3 Fatty Acids (FISH OIL PO) Take 1 capsule by mouth 2 (two) times daily.     . potassium gluconate 595 MG TABS tablet Take 595 mg by mouth daily.    . temazepam (RESTORIL) 15 MG capsule Take 1 capsule (15 mg total) by mouth at bedtime as  needed for sleep. 30 capsule 0  . triamcinolone (KENALOG) 0.1 % paste Use as directed 1 application in the mouth or throat 2 (two) times daily. 5 g 2  . urea (CARMOL) 10 % cream Apply topically as needed. 410 g 1  . albuterol (PROVENTIL HFA;VENTOLIN HFA) 108 (90 Base) MCG/ACT inhaler Inhale 2 puffs into the lungs every 6 (six) hours as needed for wheezing or shortness of breath. (Patient not taking: Reported on 11/10/2017) 1 Inhaler 2  . albuterol (PROVENTIL) (2.5 MG/3ML) 0.083% nebulizer solution Take 3 mLs (2.5 mg total) by nebulization every 6 (six) hours as needed for wheezing or shortness of breath. (Patient not taking: Reported on 11/10/2017) 75 mL 2  . budesonide-formoterol (SYMBICORT) 160-4.5 MCG/ACT inhaler Inhale 2 puffs into the lungs 2 (two) times daily.    . cyclobenzaprine (FLEXERIL) 10 MG tablet TAKE 1 TABLET BY MOUTH THREE TIMES A DAY IF NEEDED FOR MUSCLE SPASM (Patient not taking: Reported on 11/10/2017) 30 tablet 0  . fluticasone (FLONASE) 50 MCG/ACT nasal spray Place 2 sprays into both nostrils as needed. (Patient not taking: Reported on 11/10/2017) 16 g 5  . ipratropium (ATROVENT HFA) 17 MCG/ACT inhaler Inhale 2 puffs into the lungs every 6 (six) hours as needed.     Marland Kitchen Lysine 1000 MG TABS Take 1 tablet by mouth daily.     Marland Kitchen  oxyCODONE-acetaminophen (PERCOCET) 10-325 MG tablet Take 1 tablet by mouth every 8 (eight) hours as needed for pain. (Patient not taking: Reported on 11/10/2017) 90 tablet 0  . SUMAtriptan (IMITREX) 100 MG tablet May repeat in 2 hours if headache persists or recurs. (Patient not taking: Reported on 11/10/2017) 27 tablet 0  . valACYclovir (VALTREX) 1000 MG tablet TAKE 1 TABLET(1000 MG) BY MOUTH TWICE DAILY FOR 10 DAYS THEN TAKE 1/2 TABLET(500 MG) BY MOUTH TWICE DAILY (Patient not taking: Reported on 11/10/2017) 40 tablet 0   No current facility-administered medications for this visit.    Facility-Administered Medications Ordered in Other Visits  Medication Dose Route Frequency Provider Last Rate Last Dose  . 0.9 %  sodium chloride infusion   Intravenous Once Lloyd Huger, MD      . 0.9 %  sodium chloride infusion   Intravenous Once Lloyd Huger, MD      . dexamethasone (DECADRON) 20 mg in sodium chloride 0.9 % 50 mL IVPB  20 mg Intravenous Once Lloyd Huger, MD      . dexamethasone (DECADRON) injection 10 mg  10 mg Intravenous Once Lloyd Huger, MD      . sodium chloride flush (NS) 0.9 % injection 10 mL  10 mL Intravenous PRN Lloyd Huger, MD   10 mL at 06/16/17 0854    OBJECTIVE: Vitals:   11/10/17 0921  BP: (!) 146/82  Pulse: 82  Resp: 18  Temp: (!) 96.1 F (35.6 C)     Body mass index is 27.58 kg/m.    ECOG FS:0 - Asymptomatic  General: Well-developed, well-nourished, no acute distress. Eyes: Pink conjunctiva, anicteric sclera. HEENT: Normocephalic, moist mucous membranes, mild ulcerations on buccal membrane.   Lungs: Clear to auscultation bilaterally. Heart: Regular rate and rhythm. No rubs, murmurs, or gallops. Abdomen: Soft, nontender, nondistended. No organomegaly noted, normoactive bowel sounds. Musculoskeletal: No edema, cyanosis, or clubbing. Neuro: Alert, answering all questions appropriately. Cranial nerves grossly intact. Skin: No rashes  or petechiae noted.  Ulceration and erythema noted on bilateral hands and feet Psych: Normal affect.   LAB RESULTS:  Lab  Results  Component Value Date   NA 136 11/10/2017   K 4.0 11/10/2017   CL 99 11/10/2017   CO2 26 11/10/2017   GLUCOSE 152 (H) 11/10/2017   BUN 15 11/10/2017   CREATININE 0.82 11/10/2017   CALCIUM 9.2 11/10/2017   PROT 7.4 11/10/2017   ALBUMIN 3.4 (L) 11/10/2017   AST 24 11/10/2017   ALT 21 11/10/2017   ALKPHOS 77 11/10/2017   BILITOT 0.4 11/10/2017   GFRNONAA >60 11/10/2017   GFRAA >60 11/10/2017    Lab Results  Component Value Date   WBC 4.3 11/10/2017   NEUTROABS 2.8 11/10/2017   HGB 11.9 (L) 11/10/2017   HCT 34.0 (L) 11/10/2017   MCV 99.6 11/10/2017   PLT 248 11/10/2017     STUDIES: Nm Cardiac Muga Rest  Result Date: 11/05/2017 CLINICAL DATA:  Primary high-grade serous adenocarcinoma of the ovary, post chemotherapy EXAM: NUCLEAR MEDICINE CARDIAC BLOOD POOL IMAGING (MUGA) TECHNIQUE: Cardiac multi-gated acquisition was performed at rest following intravenous injection of Tc-27m labeled red blood cells. RADIOPHARMACEUTICALS:  21.578 mCi Tc-32m pertechnetate in-vitro labeled red blood cells IV COMPARISON:  08/14/2017 FINDINGS: Calculated LEFT ventricular ejection fraction is 72.7%, normal. This represents a mild decrease from the 78.8% calculated seen on the previous exam. Study was obtained at a cardiac rate of  bpm. Patient was rhythmic during imaging. No focal LEFT ventricular wall motion abnormalities. IMPRESSION: Normal LEFT ventricular ejection fraction of 72.7%, mildly decreased from the 78.8% calculated on the prior exam of 08/14/2017. No focal LEFT ventricular wall motion abnormalities. Electronically Signed   By: Lavonia Dana M.D.   On: 11/05/2017 16:06    ASSESSMENT: Stage IIIc high-grade serous ovarian carcinoma  PLAN:  1. Stage IIIc high-grade serous ovarian carcinoma:  She underwent debulking surgery at [facility] on November 19, 2016. She was also initiated on a clinical trial and has received preoperative infusion of Keytruda on November 11, 2016.  Because of patient's persistent peripheral neuropathy as well as pancytopenia, Taxol was discontinued on April 28, 2017.  CT scan from June 19, 2017 is essentially unchanged from previous scan in January.  MUGA scan on November 05, 2017 revealed an EF of 72.7%.  Patient's Ca-125 initially trended up to 113, but is now stable at 114.  Proceed with cycle 4, day 1 of Doxil plus Avastin.  Return to clinic in 2 weeks for consideration of cycle 4, day 15.  We will get laboratory work several days prior to her treatment.  2. Pain: Worse with hand and foot pain.  Fentanyl patch 75 mcg caused increased confusion, therefore patient has decreased her dose to 50 mcg every 3 days.  Continue oxycodone as needed.  Previously discussed with patient about referring back to PCP for pain management at conclusion of therapy.    3. Anxiety: Chronic and unchanged.  Continue Xanax as prescribed. 4.  Peripheral neuropathy: Continue gabapentin 600 mg 3 times per day. Will consider neurology referral in the future.   5.  Anemia: Essentially resolved.  Monitor. 6.  Leukopenia: Resolved.  Proceed with treatment as above. 7.  Mouth sores: Slightly improved, but chronic.  Continue prophylactic Valtrex and Magic mouthwash as needed.  8.  Hand/foot pain: Chronic.  Likely secondary to Doxil.  Discussed at length of discontinuing treatment which patient has declined.  Continue topical creams and increase fentanyl patch as above.  Patient expressed understanding and was in agreement with this plan. She also understands that She can call clinic at any time  with any questions, concerns, or complaints.   Cancer Staging Ovarian cancer, unspecified laterality ([place]) Staging form: Ovary, Fallopian Tube, and Primary Peritoneal Carcinoma, AJCC 8th Edition - Clinical: No stage assigned - Unsigned   Lloyd Huger, MD    11/11/2017 12:03 PM

## 2017-11-10 NOTE — Telephone Encounter (Signed)
°  [facility] called b/c they have been waiting a month for [facility] to give medical records     Copied from [place] [phone]. Topic: General - Other >> Oct 29, 2017 10:10 AM Jessica Burgess wrote: Reason for CRM: Jessica Burgess with [facility] request pt office visit notes from 02/14/14 - 02/15/15. Fax# [phone].

## 2017-11-10 NOTE — Progress Notes (Signed)
Nutrition Follow-up:  Patient with stage III ovarian cancer.  Patient receiving chemotherapy.    Met with patient during infusion today.  Patient tearful during visit and reports that Dr. Fransisca Connors told her that her cancer is not curable.   Reports that she has tried all of the the supplement shakes and liked them.  Really liked the ensure clear ("not the milky shake").  Reports that mouth is better but still has sores in her mouth.  Typically for about 10 days after chemotherapy appetite goes down due to mouth sores. Reports mild nausea on day 1 and 2 following chemotherapy.  Reports bowels are moving normally.        Medications: reviewed  Labs: reviewed  Anthropometrics:   Weight is stable at 150 lb 12.8 oz today, same as 150 lb on 8/12 RD visit  Weight 161 lb on 6/18   NUTRITION DIAGNOSIS: Inadequate oral intake continues   INTERVENTION: Active listening provided and allowed patient to verbalize feelings.    Provided patient with 1st complimentary case of ensure enlive today along with coupons Discussed soft moist protein foods.  Handout provided.     MONITORING, EVALUATION, GOAL: weight trends, intake   NEXT VISIT: Sept 9 during infusion  Gerrald Basu B. Zenia Resides, [place], [place] Registered Dietitian [phone] (pager)

## 2017-11-10 NOTE — Progress Notes (Signed)
Pt here for follow up. Showed staff small blistered areas on left and right foot/ toes that are rubbing -also stated walking causes them. Mouth blisters she stated continue.  Has toes wrapped-that helps she stated. Pain continues she stated inall these areas. Magic mouth wash helps short term. Fentanyl also help she stated. Per pt friends told her she was" loopy " with Fentanyl. Also per pt has been running a temp- 8/24 -101.7,8/25-100.1 has been taking Tylenol w effect. T this am 96.1

## 2017-11-11 LAB — CA 125: Cancer Antigen (CA) 125: 114 U/mL — ABNORMAL HIGH (ref 0.0–38.1)

## 2017-11-13 NOTE — Telephone Encounter (Signed)
Records were resent via fax to [facility] on 11/13/17 @ 9:42am from our office.  Used 2 fax #s [phone] and [phone]. The release # is on the medical release form which is located in the media tab.

## 2017-11-21 ENCOUNTER — Inpatient Hospital Stay: Payer: PPO

## 2017-11-21 ENCOUNTER — Inpatient Hospital Stay: Payer: PPO | Attending: Oncology

## 2017-11-21 DIAGNOSIS — G629 Polyneuropathy, unspecified: Secondary | ICD-10-CM | POA: Insufficient documentation

## 2017-11-21 DIAGNOSIS — E559 Vitamin D deficiency, unspecified: Secondary | ICD-10-CM | POA: Diagnosis not present

## 2017-11-21 DIAGNOSIS — G8929 Other chronic pain: Secondary | ICD-10-CM | POA: Insufficient documentation

## 2017-11-21 DIAGNOSIS — Z79899 Other long term (current) drug therapy: Secondary | ICD-10-CM | POA: Insufficient documentation

## 2017-11-21 DIAGNOSIS — E871 Hypo-osmolality and hyponatremia: Secondary | ICD-10-CM | POA: Diagnosis not present

## 2017-11-21 DIAGNOSIS — Z5112 Encounter for antineoplastic immunotherapy: Secondary | ICD-10-CM | POA: Diagnosis not present

## 2017-11-21 DIAGNOSIS — C569 Malignant neoplasm of unspecified ovary: Secondary | ICD-10-CM | POA: Diagnosis not present

## 2017-11-21 DIAGNOSIS — J189 Pneumonia, unspecified organism: Secondary | ICD-10-CM | POA: Insufficient documentation

## 2017-11-21 DIAGNOSIS — Z87891 Personal history of nicotine dependence: Secondary | ICD-10-CM | POA: Insufficient documentation

## 2017-11-21 LAB — COMPREHENSIVE METABOLIC PANEL
ALK PHOS: 72 U/L (ref 38–126)
ALT: 19 U/L (ref 0–44)
AST: 22 U/L (ref 15–41)
Albumin: 3.6 g/dL (ref 3.5–5.0)
Anion gap: 12 (ref 5–15)
BILIRUBIN TOTAL: 0.7 mg/dL (ref 0.3–1.2)
BUN: 23 mg/dL (ref 8–23)
CHLORIDE: 96 mmol/L — AB (ref 98–111)
CO2: 30 mmol/L (ref 22–32)
Calcium: 9.6 mg/dL (ref 8.9–10.3)
Creatinine, Ser: 0.72 mg/dL (ref 0.44–1.00)
GFR calc non Af Amer: >60 mL/min (ref >60)
GLUCOSE: 94 mg/dL (ref 70–99)
POTASSIUM: 3.7 mmol/L (ref 3.5–5.1)
SODIUM: 138 mmol/L (ref 135–145)
TOTAL PROTEIN: 7.3 g/dL (ref 6.5–8.1)

## 2017-11-21 LAB — CBC WITH DIFFERENTIAL/PLATELET
Basophils Absolute: 0 10*3/uL (ref 0–0.1)
Basophils Relative: 1 %
EOS ABS: 0 10*3/uL (ref 0–0.7)
Eosinophils Relative: 0 %
HEMATOCRIT: 34.6 % — AB (ref 35.0–47.0)
HEMOGLOBIN: 12.1 g/dL (ref 12.0–16.0)
LYMPHS ABS: 1.6 10*3/uL (ref 1.0–3.6)
LYMPHS PCT: 30 %
MCH: 33.9 pg (ref 26.0–34.0)
MCHC: 34.9 g/dL (ref 32.0–36.0)
MCV: 97.2 fL (ref 80.0–100.0)
Monocytes Absolute: 0.2 10*3/uL (ref 0.2–0.9)
Monocytes Relative: 3 %
NEUTROS ABS: 3.5 10*3/uL (ref 1.4–6.5)
NEUTROS PCT: 66 %
Platelets: 264 10*3/uL (ref 150–440)
RBC: 3.56 MIL/uL — AB (ref 3.80–5.20)
RDW: 13.7 % (ref 11.5–14.5)
WBC: 5.3 10*3/uL (ref 3.6–11.0)

## 2017-11-22 LAB — CA 125: Cancer Antigen (CA) 125: 122 U/mL — ABNORMAL HIGH (ref 0.0–38.1)

## 2017-11-23 NOTE — Progress Notes (Signed)
Jessica Burgess  [phone] [phone]  ID: [name] OB: October 30, 1955  MR#: [id number]  CSN#:[id number]  Patient Care Team: Steele Sizer, MD as PCP - General (Family Medicine) Lloyd Huger, MD as Consulting Physician (Oncology) Mellody Drown, MD as Consulting Physician (Obstetrics and Gynecology)  CHIEF COMPLAINT: Stage IIIc high-grade serous ovarian carcinoma  INTERVAL HISTORY: Patient returns to clinic today for further evaluation and consideration of cycle 4, day 15 of Doxil plus Avastin.  Avastin only today.   She continues to have a difficult time after her Doxil infusion with mouth sores as well as painful areas on the palms of her hands and soles of her feet.  These are slightly worse this week.  She does not complain of fevers this week. She continues to have significant peripheral neuropathy. She has no other neurologic complaints.  She denies any chest pain, shortness of breath, or cough.  She has a good appetite and denies any nausea, vomiting, constipation, or diarrhea.  She denies any abdominal pain or bloating.  She has no urinary complaints.  Patient offers no further specific complaints today.  REVIEW OF SYSTEMS:   Review of Systems  Constitutional: Negative.  Negative for fever, malaise/fatigue and weight loss.  HENT: Negative.  Negative for sore throat.   Respiratory: Negative.  Negative for cough and shortness of breath.   Cardiovascular: Negative.  Negative for chest pain and leg swelling.  Gastrointestinal: Negative.  Negative for abdominal pain, blood in stool, constipation, diarrhea, melena, nausea and vomiting.  Genitourinary: Negative.  Negative for dysuria.  Musculoskeletal: Negative.  Negative for back pain, joint pain and neck pain.       Hand and foot pain.  Skin: Negative.  Negative for itching and rash.  Neurological: Positive for tingling and sensory change. Negative for focal weakness and weakness.    Psychiatric/Behavioral: Negative.  The patient is not nervous/anxious.     As per HPI. Otherwise, a complete review of systems is negative.  PAST MEDICAL HISTORY: Past Medical History:  Diagnosis Date  . Allergic rhinitis, cause unspecified   . Anxiety state, unspecified   . Arthritis   . Asthma    only when sick   . Backache, unspecified   . Bronchitis    hx of when get sick  . Cancer ([place])    skin cancer , basal cell   . Cancer ([place]) 11/2016   ovarian  . Cervicalgia   . Complication of anesthesia   . Dermatophytosis of nail   . Dysmetabolic syndrome X   . Encounter for long-term (current) use of other medications   . Esophageal reflux   . Insomnia, unspecified   . Leukocytosis, unspecified   . Migraine without aura, without mention of intractable migraine without mention of status migrainosus   . Other and unspecified hyperlipidemia   . Other malaise and fatigue   . Overweight(278.02)   . Personal history of chemotherapy now   ovarian  . PONV (postoperative nausea and vomiting)   . Spinal stenosis in cervical region   . Symptomatic menopausal or female climacteric states   . Unspecified disorder of skin and subcutaneous tissue   . Unspecified vitamin D deficiency     PAST SURGICAL HISTORY: Past Surgical History:  Procedure Laterality Date  . ABDOMINAL HYSTERECTOMY    . ANTERIOR CERVICAL DECOMP/DISCECTOMY FUSION N/A 06/07/2015   Procedure: Cervical three - four and Cervical six- seven anterior cervical decompression with fusion interbody prosthesis plating and bonegraft;  Surgeon:  Newman Pies, MD;  Location: [facility];  Service: Neurosurgery;  Laterality: N/A;  C34 and C67 anterior cervical decompression with fusion interbody prosthesis plating and bonegraft  . BACK SURGERY     x2 Lower   . EVACUATION OF CERVICAL HEMATOMA N/A 06/14/2015   Procedure: EVACUATION OF CERVICAL HEMATOMA;  Surgeon: Newman Pies, MD;  Location: [place] [facility];  Service:  Neurosurgery;  Laterality: N/A;  . NECK SURGERY     x3  . TONSILLECTOMY      FAMILY HISTORY: Family History  Problem Relation Age of Onset  . Depression Mother   . Migraines Mother   . Dementia Father   . Diabetes Father   . Hyperlipidemia Father   . Hyperlipidemia Brother   . Hyperlipidemia Brother   . Breast cancer Paternal Aunt        52s    ADVANCED DIRECTIVES (Y/N):  N  HEALTH MAINTENANCE: Social History   Tobacco Use  . Smoking status: Former Smoker    Packs/day: 1.50    Years: 20.00    Pack years: 30.00    Types: Cigarettes    Start date: 03/19/1979    Last attempt to quit: 08/25/1999    Years since quitting: 18.2  . Smokeless tobacco: Never Used  . Tobacco comment: smoking cessation materials not required  Substance Use Topics  . Alcohol use: Not Currently    Alcohol/week: 0.0 standard drinks  . Drug use: No     Colonoscopy:  PAP:  Bone density:  Lipid panel:  No Known Allergies  Current Outpatient Medications  Medication Sig Dispense Refill  . Ascorbic Acid (VITAMIN C PO) Take 1 tablet by mouth daily.     . budesonide-formoterol (SYMBICORT) 160-4.5 MCG/ACT inhaler Inhale 2 puffs into the lungs 2 (two) times daily.    . Cholecalciferol (VITAMIN D-1000 MAX ST) 1000 UNITS tablet Take 1 tablet by mouth 2 (two) times daily.    . Cyanocobalamin (B-12 PO) Take 1 tablet by mouth daily.     . DULoxetine (CYMBALTA) 60 MG capsule Take 1 capsule (60 mg total) by mouth daily. 90 capsule 0  . estradiol (ESTRACE) 0.5 MG tablet Take 1 tablet (0.5 mg total) by mouth daily. 90 tablet 4  . ferrous sulfate (IRON SUPPLEMENT) 325 (65 FE) MG tablet Take 1 tablet by mouth daily.    Marland Kitchen gabapentin (NEURONTIN) 300 MG capsule Take 1-3 capsules (300-900 mg total) by mouth 3 (three) times daily. 300 mg twice a day and 900 mg at night (Patient taking differently: Take 900 mg by mouth 2 (two) times daily. 300 mg twice a day and 900 mg at night) 540 capsule 1  . ipratropium (ATROVENT  HFA) 17 MCG/ACT inhaler Inhale 2 puffs into the lungs every 6 (six) hours as needed.     Marland Kitchen lisinopril (PRINIVIL,ZESTRIL) 20 MG tablet Take 1 tablet (20 mg total) by mouth daily. 30 tablet 0  . Lysine 1000 MG TABS Take 1 tablet by mouth daily.     . magic mouthwash w/lidocaine SOLN Take 5 mLs by mouth 3 (three) times daily. 315 mL 0  . Magnesium Oxide 500 MG CAPS Take 500 mg by mouth 2 (two) times daily.     . Multiple Vitamins-Minerals (MULTIVITAMIN PO) Take 1 tablet by mouth daily.     . Omega-3 Fatty Acids (FISH OIL PO) Take 1 capsule by mouth 2 (two) times daily.     . potassium gluconate 595 MG TABS tablet Take 595 mg by mouth daily.    Marland Kitchen  temazepam (RESTORIL) 15 MG capsule Take 1 capsule (15 mg total) by mouth at bedtime as needed for sleep. 30 capsule 0  . triamcinolone (KENALOG) 0.1 % paste Use as directed 1 application in the mouth or throat 2 (two) times daily. 5 g 2  . urea (CARMOL) 10 % cream Apply topically as needed. 410 g 1  . valACYclovir (VALTREX) 1000 MG tablet TAKE 1 TABLET(1000 MG) BY MOUTH THREE TIMES DAILY FOR 7 DAYS 21 tablet 0  . albuterol (PROVENTIL HFA;VENTOLIN HFA) 108 (90 Base) MCG/ACT inhaler Inhale 2 puffs into the lungs every 6 (six) hours as needed for wheezing or shortness of breath. (Patient not taking: Reported on 11/10/2017) 1 Inhaler 2  . albuterol (PROVENTIL) (2.5 MG/3ML) 0.083% nebulizer solution Take 3 mLs (2.5 mg total) by nebulization every 6 (six) hours as needed for wheezing or shortness of breath. (Patient not taking: Reported on 11/10/2017) 75 mL 2  . cyclobenzaprine (FLEXERIL) 10 MG tablet TAKE 1 TABLET BY MOUTH THREE TIMES A DAY IF NEEDED FOR MUSCLE SPASM (Patient not taking: Reported on 11/10/2017) 30 tablet 0  . fentaNYL (DURAGESIC - DOSED MCG/HR) 75 MCG/HR Place 1 patch (75 mcg total) onto the skin every 3 (three) days. 10 patch 0  . fluticasone (FLONASE) 50 MCG/ACT nasal spray Place 2 sprays into both nostrils as needed. (Patient not taking: Reported on  11/10/2017) 16 g 5  . oxyCODONE-acetaminophen (PERCOCET) 10-325 MG tablet Take 1 tablet by mouth every 8 (eight) hours as needed for pain. 90 tablet 0  . SUMAtriptan (IMITREX) 100 MG tablet May repeat in 2 hours if headache persists or recurs. (Patient not taking: Reported on 11/10/2017) 27 tablet 0   No current facility-administered medications for this visit.    Facility-Administered Medications Ordered in Other Visits  Medication Dose Route Frequency Provider Last Rate Last Dose  . 0.9 %  sodium chloride infusion   Intravenous Once Lloyd Huger, MD      . 0.9 %  sodium chloride infusion   Intravenous Once Lloyd Huger, MD      . dexamethasone (DECADRON) 20 mg in sodium chloride 0.9 % 50 mL IVPB  20 mg Intravenous Once Lloyd Huger, MD      . dexamethasone (DECADRON) injection 10 mg  10 mg Intravenous Once Lloyd Huger, MD      . sodium chloride flush (NS) 0.9 % injection 10 mL  10 mL Intravenous PRN Lloyd Huger, MD   10 mL at 06/16/17 0854    OBJECTIVE: Vitals:   11/24/17 1029  BP: 112/76  Pulse: 82  Resp: 18  Temp: (!) 96 F (35.6 C)     Body mass index is 26.8 kg/m.    ECOG FS:0 - Asymptomatic  General: Well-developed, well-nourished, no acute distress. Eyes: Pink conjunctiva, anicteric sclera. HEENT: Normocephalic, moist mucous membranes.  Multiple ulcerations on buccal membrane and tongue. Lungs: Clear to auscultation bilaterally. Heart: Regular rate and rhythm. No rubs, murmurs, or gallops. Abdomen: Soft, nontender, nondistended. No organomegaly noted, normoactive bowel sounds. Musculoskeletal: No edema, cyanosis, or clubbing. Neuro: Alert, answering all questions appropriately. Cranial nerves grossly intact. Skin: Peeling skin of hands and feet.  Mild erythema. Psych: Normal affect.  LAB RESULTS:  Lab Results  Component Value Date   NA 138 11/21/2017   K 3.7 11/21/2017   CL 96 (L) 11/21/2017   CO2 30 11/21/2017   GLUCOSE 94  11/21/2017   BUN 23 11/21/2017   CREATININE 0.72 11/21/2017  CALCIUM 9.6 11/21/2017   PROT 7.3 11/21/2017   ALBUMIN 3.6 11/21/2017   AST 22 11/21/2017   ALT 19 11/21/2017   ALKPHOS 72 11/21/2017   BILITOT 0.7 11/21/2017   GFRNONAA >60 11/21/2017   GFRAA >60 11/21/2017    Lab Results  Component Value Date   WBC 5.3 11/21/2017   NEUTROABS 3.5 11/21/2017   HGB 12.1 11/21/2017   HCT 34.6 (L) 11/21/2017   MCV 97.2 11/21/2017   PLT 264 11/21/2017     STUDIES: Nm Cardiac Muga Rest  Result Date: 11/05/2017 CLINICAL DATA:  Primary high-grade serous adenocarcinoma of the ovary, post chemotherapy EXAM: NUCLEAR MEDICINE CARDIAC BLOOD POOL IMAGING (MUGA) TECHNIQUE: Cardiac multi-gated acquisition was performed at rest following intravenous injection of Tc-9m labeled red blood cells. RADIOPHARMACEUTICALS:  21.578 mCi Tc-23m pertechnetate in-vitro labeled red blood cells IV COMPARISON:  08/14/2017 FINDINGS: Calculated LEFT ventricular ejection fraction is 72.7%, normal. This represents a mild decrease from the 78.8% calculated seen on the previous exam. Study was obtained at a cardiac rate of  bpm. Patient was rhythmic during imaging. No focal LEFT ventricular wall motion abnormalities. IMPRESSION: Normal LEFT ventricular ejection fraction of 72.7%, mildly decreased from the 78.8% calculated on the prior exam of 08/14/2017. No focal LEFT ventricular wall motion abnormalities. Electronically Signed   By: Lavonia Dana M.D.   On: 11/05/2017 16:06    ASSESSMENT: Stage IIIc high-grade serous ovarian carcinoma  PLAN:  1. Stage IIIc high-grade serous ovarian carcinoma:  She underwent debulking surgery at [facility] on November 19, 2016. She was also initiated on a clinical trial and has received preoperative infusion of Keytruda on November 11, 2016.  Because of patient's persistent peripheral neuropathy as well as pancytopenia, Taxol was discontinued on April 28, 2017.  CT scan from June 19, 2017 is essentially unchanged from previous scan in January.  MUGA scan on November 05, 2017 revealed an EF of 72.7%.  Patient's CA-125 is slowly trending up, but essentially stable at 122.  Proceed with cycle 4, day 15 of Doxil plus Avastin.  Return to clinic in 2 weeks for further evaluation and consideration of cycle 5, day 1.  We will continue to get laboratory work 1 to 2 days prior to her treatment.   2. Pain: Worse with hand and foot pain.  Continue fentanyl patch and oxycodone. Previously discussed with patient about referring back to PCP for pain management at conclusion of therapy.    3. Anxiety: Chronic and unchanged.  Continue Xanax as prescribed. 4.  Peripheral neuropathy: Continue gabapentin 600 mg 3 times per day. Will consider neurology referral in the future.   5.  Anemia: Essentially resolved.  Monitor. 6.  Leukopenia: Resolved.   7.  Mouth sores: Slightly worse today.  Patient was given treatment dose of Valtrex 1000 mg 3 times a day for 7 days.  Continue Magic mouthwash as needed. 8.  Hand/foot pain: Chronic.  Likely secondary to Doxil.  Discussed at length of discontinuing treatment which patient has declined.  Continue topical creams and increase fentanyl patch as above.  Patient expressed understanding and was in agreement with this plan. She also understands that She can call clinic at any time with any questions, concerns, or complaints.   Cancer Staging Ovarian cancer, unspecified laterality ([place]) Staging form: Ovary, Fallopian Tube, and Primary Peritoneal Carcinoma, AJCC 8th Edition - Clinical: No stage assigned - Unsigned   Lloyd Huger, MD   11/25/2017 10:14 AM

## 2017-11-24 ENCOUNTER — Other Ambulatory Visit: Payer: PPO

## 2017-11-24 ENCOUNTER — Encounter: Payer: Self-pay | Admitting: Oncology

## 2017-11-24 ENCOUNTER — Inpatient Hospital Stay: Payer: PPO

## 2017-11-24 ENCOUNTER — Ambulatory Visit: Payer: PPO | Admitting: Oncology

## 2017-11-24 ENCOUNTER — Inpatient Hospital Stay (HOSPITAL_BASED_OUTPATIENT_CLINIC_OR_DEPARTMENT_OTHER): Payer: PPO | Admitting: Oncology

## 2017-11-24 ENCOUNTER — Ambulatory Visit: Payer: PPO

## 2017-11-24 ENCOUNTER — Other Ambulatory Visit: Payer: Self-pay | Admitting: *Deleted

## 2017-11-24 VITALS — BP 109/70 | HR 80

## 2017-11-24 VITALS — BP 112/76 | HR 82 | Temp 96.0°F | Resp 18 | Wt 146.5 lb

## 2017-11-24 DIAGNOSIS — C569 Malignant neoplasm of unspecified ovary: Secondary | ICD-10-CM

## 2017-11-24 DIAGNOSIS — G8929 Other chronic pain: Secondary | ICD-10-CM

## 2017-11-24 DIAGNOSIS — E559 Vitamin D deficiency, unspecified: Secondary | ICD-10-CM

## 2017-11-24 DIAGNOSIS — M5441 Lumbago with sciatica, right side: Principal | ICD-10-CM

## 2017-11-24 DIAGNOSIS — M542 Cervicalgia: Secondary | ICD-10-CM

## 2017-11-24 DIAGNOSIS — Z79899 Other long term (current) drug therapy: Secondary | ICD-10-CM

## 2017-11-24 DIAGNOSIS — Z87891 Personal history of nicotine dependence: Secondary | ICD-10-CM | POA: Diagnosis not present

## 2017-11-24 DIAGNOSIS — G629 Polyneuropathy, unspecified: Secondary | ICD-10-CM | POA: Diagnosis not present

## 2017-11-24 DIAGNOSIS — Z5112 Encounter for antineoplastic immunotherapy: Secondary | ICD-10-CM | POA: Diagnosis not present

## 2017-11-24 LAB — PROTEIN, URINE, RANDOM: Total Protein, Urine: 20 mg/dL

## 2017-11-24 MED ORDER — SODIUM CHLORIDE 0.9% FLUSH
10.0000 mL | INTRAVENOUS | Status: DC | PRN
  Filled 2017-11-24: qty 10

## 2017-11-24 MED ORDER — SODIUM CHLORIDE 0.9 % IV SOLN
Freq: Once | INTRAVENOUS | Status: AC
  Administered 2017-11-24: 11:00:00 via INTRAVENOUS
  Filled 2017-11-24: qty 250

## 2017-11-24 MED ORDER — DEXAMETHASONE SODIUM PHOSPHATE 10 MG/ML IJ SOLN
10.0000 mg | Freq: Once | INTRAMUSCULAR | Status: AC
  Administered 2017-11-24: 10 mg via INTRAVENOUS
  Filled 2017-11-24: qty 1

## 2017-11-24 MED ORDER — OXYCODONE-ACETAMINOPHEN 10-325 MG PO TABS: 1.0000 | ORAL_TABLET | Freq: Three times a day (TID) | ORAL | 0 refills | Status: DC | PRN

## 2017-11-24 MED ORDER — SODIUM CHLORIDE 0.9 % IV SOLN
700.0000 mg | Freq: Once | INTRAVENOUS | Status: AC
  Administered 2017-11-24: 700 mg via INTRAVENOUS
  Filled 2017-11-24: qty 16

## 2017-11-24 MED ORDER — HEPARIN SOD (PORK) LOCK FLUSH 100 UNIT/ML IV SOLN
500.0000 [IU] | Freq: Once | INTRAVENOUS | Status: AC | PRN
  Administered 2017-11-24: 500 [IU]
  Filled 2017-11-24: qty 5

## 2017-11-24 MED ORDER — SODIUM CHLORIDE 0.9 % IV SOLN: 10.0000 mg | Freq: Once | INTRAVENOUS | Status: DC

## 2017-11-24 MED ORDER — VALACYCLOVIR HCL 1 G PO TABS: ORAL_TABLET | ORAL | 0 refills | Status: DC

## 2017-11-24 MED ORDER — FENTANYL 75 MCG/HR TD PT72: 75.0000 ug | MEDICATED_PATCH | TRANSDERMAL | 0 refills | Status: DC

## 2017-11-24 NOTE — Progress Notes (Signed)
Pt in for follow up, states "this last big treatment, hit me the hardest this time".  Pt still having issues with large mouth ulcers along with peeling of soles of feet and palms of feet.  Pt also questioned when she can have a dental cleaning.

## 2017-11-24 NOTE — Progress Notes (Signed)
Per Tillie Rung RN per Dr. Grayland Ormond urine protein to be collected, may proceed with treatment prior to results based on 11/10/17 results.

## 2017-11-24 NOTE — Progress Notes (Signed)
Nutrition Follow-up:  Patient with stage III ovarian cancer.  Patient receiving chemotherapy and followed by Dr. Grayland Ormond  Met with patient during infusion today.  Patient reports mouth is sore today maybe worse than it has been per patient.  Eating really soft foods, friend fixed chicken alfredo and has been eating last few days.  Reports drinking shakes (ensure) but not as many as you want to drink.  Eggs have funny texture in mouth so has not been eating.  Eating cheesecake recently.    Mild nausea but controlled with medication  Medications: reviewed  Labs: reviewed  Anthropometrics:   Weight decreased to 146 lb today from 150 lb.    Patient reports wants to get down to 130 lb.  "I am currently not overweight, no longer take DM meds and my cholesterol is better."   NUTRITION DIAGNOSIS: Inadequate oral intake continues due to mouth sores   INTERVENTION:  Offered case of ensure today as well as coupons but pt declined.   Reviewed soft moist foods high in calories and protein    MONITORING, EVALUATION, GOAL: weight trends, intake   NEXT VISIT: to be determined   Jessica Burgess B. Zenia Resides, [place], [place] Registered Dietitian [phone] (pager)

## 2017-11-28 ENCOUNTER — Telehealth: Payer: Self-pay | Admitting: *Deleted

## 2017-11-28 ENCOUNTER — Other Ambulatory Visit: Payer: Self-pay | Admitting: Oncology

## 2017-11-28 ENCOUNTER — Other Ambulatory Visit: Payer: Self-pay | Admitting: *Deleted

## 2017-11-28 MED ORDER — FENTANYL 50 MCG/HR TD PT72: 50.0000 ug | MEDICATED_PATCH | TRANSDERMAL | 0 refills | Status: DC

## 2017-11-28 MED ORDER — LIDOCAINE VISCOUS HCL 2 % MT SOLN: 15.0000 mL | OROMUCOSAL | 0 refills | Status: AC | PRN

## 2017-11-28 NOTE — Telephone Encounter (Signed)
I spoke with patients pharmacy to cancel rx for Fentanyl 75 mcg patches, new rx for Fentanyl 50 mcg sent to [facility] Baller for approval. I have forwarded original message to S[facility] Baller as well for recommendations regarding mouth sores.

## 2017-11-28 NOTE — Telephone Encounter (Signed)
Patient called to report that her prescription for fentyl patches should have been for 50 MCG/HR not 75 MCG/HR. Please clarify with pharmacy. She also stated that her mouth ulcers are worse today and wanted to know if there was another medication she could try.

## 2017-12-03 ENCOUNTER — Encounter: Payer: Self-pay | Admitting: Family Medicine

## 2017-12-03 ENCOUNTER — Ambulatory Visit (INDEPENDENT_AMBULATORY_CARE_PROVIDER_SITE_OTHER): Payer: PPO | Admitting: Family Medicine

## 2017-12-03 VITALS — BP 106/70 | HR 77 | Temp 97.4°F | Resp 16 | Ht 62.0 in | Wt 149.1 lb

## 2017-12-03 DIAGNOSIS — T50905A Adverse effect of unspecified drugs, medicaments and biological substances, initial encounter: Secondary | ICD-10-CM | POA: Diagnosis not present

## 2017-12-03 DIAGNOSIS — G4700 Insomnia, unspecified: Secondary | ICD-10-CM

## 2017-12-03 DIAGNOSIS — G43009 Migraine without aura, not intractable, without status migrainosus: Secondary | ICD-10-CM

## 2017-12-03 DIAGNOSIS — Z79899 Other long term (current) drug therapy: Secondary | ICD-10-CM

## 2017-12-03 DIAGNOSIS — I158 Other secondary hypertension: Secondary | ICD-10-CM

## 2017-12-03 DIAGNOSIS — Z23 Encounter for immunization: Secondary | ICD-10-CM | POA: Diagnosis not present

## 2017-12-03 MED ORDER — LISINOPRIL 10 MG PO TABS: 10.0000 mg | ORAL_TABLET | Freq: Every day | ORAL | 0 refills | Status: DC

## 2017-12-03 MED ORDER — TEMAZEPAM 15 MG PO CAPS: 15.0000 mg | ORAL_CAPSULE | Freq: Every evening | ORAL | 2 refills | Status: DC | PRN

## 2017-12-03 MED ORDER — GALCANEZUMAB-GNLM 120 MG/ML ~~LOC~~ SOSY: 120.0000 mg | PREFILLED_SYRINGE | SUBCUTANEOUS | 12 refills | Status: DC

## 2017-12-03 NOTE — Progress Notes (Signed)
Name: Jessica Burgess   MRN: [id number]    DOB: Dec 27, 1955   Date:12/03/2017       Progress Note  Subjective  Chief Complaint  Chief Complaint  Patient presents with  . Follow-up    1 month F/U BP and Sleep  . Hypertension    Has been low since on the BP pill  . Insomnia    Has been sleeping better-still using the Temazepam    HPI  HTN: secondary to chemo medication, on lisinopril 20 mg a day and bp is towards low end of normal, we will decrease dose to 10  Mg daily, she denies chest pain or palpitation.   Insomnia: she is taking Temazpeam every night, states it helps her sleep but higher dose could be better. She is on a lot of sedating medication so we will not increase dose at this time. She also has nocturia that disturbs her sleep  Migraine headaches: getting worse, she took over 20 imitrex since last visit. Episodes of migraine happens almost daily, in one week she may go one day without a headache. Migraine is described as a band around her head, throbbing and intense. Associated with nausea, photophobia and phonophobia. She has seen headache center and tried multiple prophylactic medications including topamax that either did not work or caused side effects. Discussed new medication class and she is willing to try Emgality today. Loading dose given during her visit.    Patient Active Problem List   Diagnosis Date Noted  . 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 Mucositis due to chemotherapy 09/02/2017  . Goals of care, counseling/discussion 08/08/2017  . Hypothyroidism due to medication 07/28/2017  . Genetic testing 12/18/2016  . Migraines 12/01/2016  . Postoperative seroma of subcutaneous tissue after non-dermatologic procedure 12/01/2016  . Transaminitis 12/01/2016  . Anemia associated with acute blood loss 11/20/2016  . Ovarian cancer, unspecified laterality ([place]) 11/12/2016  . 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 Examination of  participant in clinical trial 11/01/2016  . Chronic right-sided low back pain with right-sided sciatica 02/02/2016  . History of neck surgery 08/01/2015  . Hematoma 06/14/2015  . Perennial allergic rhinitis 05/05/2015  . Generalized anxiety disorder 02/03/2015  . Back pain, chronic 08/25/2014  . Insomnia, persistent 08/25/2014  . Chronic cervical pain 08/25/2014  . Major depression, chronic ([place]) 08/25/2014  . Dyslipidemia 08/25/2014  . Gastro-esophageal reflux disease without esophagitis 08/25/2014  . H/O high risk medication treatment 08/25/2014  . Blood glucose elevated 08/25/2014  . Migraine without aura and without status migrainosus, not intractable 08/25/2014  . Climacteric 08/25/2014  . Dysmetabolic syndrome 68/34/1962  . Fungal infection of toenail 08/25/2014  . Obesity (BMI 30.0-34.9) 08/25/2014  . Vitamin D deficiency 08/25/2014  . Engages in travel abroad 08/25/2014  . Cervical disc disorder with radiculopathy 04/28/2013    Past Surgical History:  Procedure Laterality Date  . ABDOMINAL HYSTERECTOMY    . ANTERIOR CERVICAL DECOMP/DISCECTOMY FUSION N/A 06/07/2015   Procedure: Cervical three - four and Cervical six- seven anterior cervical decompression with fusion interbody prosthesis plating and bonegraft;  Surgeon: Newman Pies, MD;  Location: [facility];  Service: Neurosurgery;  Laterality: N/A;  C34 and C67 anterior cervical decompression with fusion interbody prosthesis plating and bonegraft  . BACK SURGERY     x2 Lower   . EVACUATION OF CERVICAL HEMATOMA N/A 06/14/2015   Procedure: EVACUATION OF CERVICAL HEMATOMA;  Surgeon: Newman Pies, MD;  Location: [facility];  Service:  Neurosurgery;  Laterality: N/A;  . NECK SURGERY     x3  . TONSILLECTOMY      Family History  Problem Relation Age of Onset  . Depression Mother   . Migraines Mother   . Dementia Father   . Diabetes Father   . Hyperlipidemia Father   . Hyperlipidemia Brother   . Hyperlipidemia  Brother   . Breast cancer Paternal Aunt        30s    Social History   Socioeconomic History  . Marital status: Single    Spouse name: Not on file  . Number of children: 0  . Years of education: some college  . Highest education level: 12th grade  Occupational History    Employer: DISABLED  . Occupation: Disabled   Social Needs  . Financial resource strain: Very hard  . Food insecurity:    Worry: Never true    Inability: Never true  . Transportation needs:    Medical: No    Non-medical: No  Tobacco Use  . Smoking status: Former Smoker    Packs/day: 1.50    Years: 20.00    Pack years: 30.00    Types: Cigarettes    Start date: 03/19/1979    Last attempt to quit: 08/25/1999    Years since quitting: 18.2  . Smokeless tobacco: Never Used  . Tobacco comment: smoking cessation materials not required  Substance and Sexual Activity  . Alcohol use: Not Currently    Alcohol/week: 0.0 standard drinks  . Drug use: No  . Sexual activity: Never  Lifestyle  . Physical activity:    Days per week: 0 days    Minutes per session: 0 min  . Stress: Very much  Relationships  . Social connections:    Talks on phone: Patient refused    Gets together: Patient refused    Attends religious service: Patient refused    Active member of club or organization: Patient refused    Attends meetings of clubs or organizations: Patient refused    Relationship status: Patient refused  . Intimate partner violence:    Fear of current or ex partner: No    Emotionally abused: No    Physically abused: No    Forced sexual activity: No  Other Topics Concern  . Not on file  Social History Narrative   Patient is single.    Patient lives with roommates.    Patient on disability    Patient has no children.    Patient has some college      Current Outpatient Medications:  .  albuterol (PROVENTIL HFA;VENTOLIN HFA) 108 (90 Base) MCG/ACT inhaler, Inhale 2 puffs into the lungs every 6 (six) hours as needed  for wheezing or shortness of breath., Disp: 1 Inhaler, Rfl: 2 .  albuterol (PROVENTIL) (2.5 MG/3ML) 0.083% nebulizer solution, Take 3 mLs (2.5 mg total) by nebulization every 6 (six) hours as needed for wheezing or shortness of breath., Disp: 75 mL, Rfl: 2 .  Ascorbic Acid (VITAMIN C PO), Take 1 tablet by mouth daily. , Disp: , Rfl:  .  budesonide-formoterol (SYMBICORT) 160-4.5 MCG/ACT inhaler, Inhale 2 puffs into the lungs 2 (two) times daily., Disp: , Rfl:  .  Cholecalciferol (VITAMIN D-1000 MAX ST) 1000 UNITS tablet, Take 1 tablet by mouth 2 (two) times daily., Disp: , Rfl:  .  Cyanocobalamin (B-12 PO), Take 1 tablet by mouth daily. , Disp: , Rfl:  .  cyclobenzaprine (FLEXERIL) 10 MG tablet, TAKE 1 TABLET BY MOUTH  THREE TIMES A DAY IF NEEDED FOR MUSCLE SPASM, Disp: 30 tablet, Rfl: 0 .  DULoxetine (CYMBALTA) 60 MG capsule, Take 1 capsule (60 mg total) by mouth daily., Disp: 90 capsule, Rfl: 0 .  estradiol (ESTRACE) 0.5 MG tablet, Take 1 tablet (0.5 mg total) by mouth daily., Disp: 90 tablet, Rfl: 4 .  fentaNYL (DURAGESIC - DOSED MCG/HR) 50 MCG/HR, Place 1 patch (50 mcg total) onto the skin every 3 (three) days., Disp: 10 patch, Rfl: 0 .  ferrous sulfate (IRON SUPPLEMENT) 325 (65 FE) MG tablet, Take 1 tablet by mouth daily., Disp: , Rfl:  .  fluticasone (FLONASE) 50 MCG/ACT nasal spray, Place 2 sprays into both nostrils as needed., Disp: 16 g, Rfl: 5 .  gabapentin (NEURONTIN) 300 MG capsule, Take 1-3 capsules (300-900 mg total) by mouth 3 (three) times daily. 300 mg twice a day and 900 mg at night (Patient taking differently: Take 900 mg by mouth 2 (two) times daily. 300 mg twice a day and 900 mg at night), Disp: 540 capsule, Rfl: 1 .  ipratropium (ATROVENT HFA) 17 MCG/ACT inhaler, Inhale 2 puffs into the lungs every 6 (six) hours as needed. , Disp: , Rfl:  .  lidocaine (XYLOCAINE) 2 % solution, Use as directed 15 mLs in the mouth or throat as needed for mouth pain., Disp: 100 mL, Rfl: 0 .   lisinopril (PRINIVIL,ZESTRIL) 10 MG tablet, Take 1 tablet (10 mg total) by mouth daily., Disp: 90 tablet, Rfl: 0 .  Lysine 1000 MG TABS, Take 1 tablet by mouth daily. , Disp: , Rfl:  .  magic mouthwash w/lidocaine SOLN, Take 5 mLs by mouth 3 (three) times daily., Disp: 315 mL, Rfl: 0 .  Magnesium Oxide 500 MG CAPS, Take 500 mg by mouth 2 (two) times daily. , Disp: , Rfl:  .  Multiple Vitamins-Minerals (MULTIVITAMIN PO), Take 1 tablet by mouth daily. , Disp: , Rfl:  .  Omega-3 Fatty Acids (FISH OIL PO), Take 1 capsule by mouth 2 (two) times daily. , Disp: , Rfl:  .  oxyCODONE-acetaminophen (PERCOCET) 10-325 MG tablet, Take 1 tablet by mouth every 8 (eight) hours as needed for pain., Disp: 90 tablet, Rfl: 0 .  potassium gluconate 595 MG TABS tablet, Take 595 mg by mouth daily., Disp: , Rfl:  .  SUMAtriptan (IMITREX) 100 MG tablet, May repeat in 2 hours if headache persists or recurs., Disp: 27 tablet, Rfl: 0 .  temazepam (RESTORIL) 15 MG capsule, Take 1 capsule (15 mg total) by mouth at bedtime as needed for sleep., Disp: 30 capsule, Rfl: 2 .  triamcinolone (KENALOG) 0.1 % paste, Use as directed 1 application in the mouth or throat 2 (two) times daily., Disp: 5 g, Rfl: 2 .  urea (CARMOL) 10 % cream, Apply topically as needed., Disp: 410 g, Rfl: 1 .  valACYclovir (VALTREX) 1000 MG tablet, TAKE 1 TABLET(1000 MG) BY MOUTH THREE TIMES DAILY FOR 7 DAYS, Disp: 21 tablet, Rfl: 0 .  Galcanezumab-gnlm (EMGALITY) 120 MG/ML SOSY, Inject 120 mg into the skin every 30 (thirty) days., Disp: 1 mL, Rfl: 12 No current facility-administered medications for this visit.   Facility-Administered Medications Ordered in Other Visits:  .  0.9 %  sodium chloride infusion, , Intravenous, Once, Finnegan, Kathlene November, MD .  0.9 %  sodium chloride infusion, , Intravenous, Once, Grayland Ormond, Kathlene November, MD .  dexamethasone (DECADRON) 20 mg in sodium chloride 0.9 % 50 mL IVPB, 20 mg, Intravenous, Once, [place] Ormond, Kathlene November, MD .  dexamethasone (DECADRON) injection 10 mg, 10 mg, Intravenous, Once, Finnegan, Kathlene November, MD .  sodium chloride flush (NS) 0.9 % injection 10 mL, 10 mL, Intravenous, PRN, Lloyd Huger, MD, 10 mL at 06/16/17 0854  No Known Allergies  I personally reviewed active problem list, medication list, allergies, family history, social history with the patient/caregiver today.   ROS  Constitutional: Negative for fever or weight change.  Respiratory: Negative for cough and shortness of breath.   Cardiovascular: Negative for chest pain or palpitations.  Gastrointestinal: Positive  for intermittent abdominal pain, no bowel changes.  Musculoskeletal: Negative for gait problem or joint swelling.  Skin: Negative for rash.  Neurological: Negative for dizziness, positive for  headache.  No other specific complaints in a complete review of systems (except as listed in HPI above).  Objective  Vitals:   12/03/17 1435  BP: 106/70  Pulse: 77  Resp: 16  Temp: (!) 97.4 F (36.3 C)  TempSrc: Oral  SpO2: 97%  Weight: 149 lb 1.6 oz (67.6 kg)  Height: '5\' 2"'  (1.575 m)    Body mass index is 27.27 kg/m.  Physical Exam  Constitutional: Patient appears well-developed and well-nourished. Overweight.  No distress.  HEENT: head atraumatic, normocephalic, pupils equal and reactive to light, neck supple, throat within normal limits Cardiovascular: Normal rate, regular rhythm and normal heart sounds.  No murmur heard. No BLE edema. Pulmonary/Chest: Effort normal and breath sounds normal. No respiratory distress. Abdominal: Soft.  There is no tenderness. Neurological: normal cranial nerves, normal grip Psychiatric: Patient has a normal mood and affect. behavior is normal. Judgment and thought content normal.  Recent Results (from the past 2160 hour(s))  Comprehensive metabolic panel     Status: Abnormal   Collection Time: 09/15/17  8:35 AM  Result Value Ref Range   Sodium 138 135 - 145 mmol/L    Potassium 3.8 3.5 - 5.1 mmol/L   Chloride 99 98 - 111 mmol/L    Comment: Please note change in reference range.   CO2 28 22 - 32 mmol/L   Glucose, Bld 96 70 - 99 mg/dL    Comment: Please note change in reference range.   BUN 16 8 - 23 mg/dL    Comment: Please note change in reference range.   Creatinine, Ser 0.98 0.44 - 1.00 mg/dL   Calcium 8.9 8.9 - 10.3 mg/dL   Total Protein 6.7 6.5 - 8.1 g/dL   Albumin 3.9 3.5 - 5.0 g/dL   AST 22 15 - 41 U/L   ALT 15 0 - 44 U/L    Comment: Please note change in reference range.   Alkaline Phosphatase 79 38 - 126 U/L   Total Bilirubin 0.2 (L) 0.3 - 1.2 mg/dL   GFR calc non Af Amer >60 >60 mL/min   GFR calc Af Amer >60 >60 mL/min    Comment: (NOTE) The eGFR has been calculated using the CKD EPI equation. This calculation has not been validated in all clinical situations. eGFR's persistently <60 mL/min signify possible Chronic Kidney Disease.    Anion gap 11 5 - 15    Comment: Performed at [facility], [address] [place], [place] [address]  CBC with Differential     Status: Abnormal   Collection Time: 09/15/17  8:35 AM  Result Value Ref Range   WBC 4.6 3.6 - 11.0 K/uL   RBC 3.30 (L) 3.80 - 5.20 MIL/uL   Hemoglobin 11.8 (L) 12.0 - 16.0 g/dL   HCT 33.0 (L)  35.0 - 47.0 %   MCV 100.0 80.0 - 100.0 fL   MCH 35.8 (H) 26.0 - 34.0 pg   MCHC 35.8 32.0 - 36.0 g/dL   RDW 15.1 (H) 11.5 - 14.5 %   Platelets 238 150 - 440 K/uL   Neutrophils Relative % 40 %   Neutro Abs 1.8 1.4 - 6.5 K/uL   Lymphocytes Relative 41 %   Lymphs Abs 1.9 1.0 - 3.6 K/uL   Monocytes Relative 14 %   Monocytes Absolute 0.7 0.2 - 0.9 K/uL   Eosinophils Relative 4 %   Eosinophils Absolute 0.2 0 - 0.7 K/uL   Basophils Relative 1 %   Basophils Absolute 0.0 0 - 0.1 K/uL    Comment: Performed at [facility], [address] [place], [place] [address]  CA 125     Status: Abnormal   Collection Time: 09/15/17  8:35 AM  Result Value Ref Range   Cancer Antigen  (CA) 125 124.1 (H) 0.0 - 38.1 U/mL    Comment: (NOTE) Roche [employer]ostics Electrochemiluminescence Immunoassay (ECLIA) Values obtained with different assay methods or kits cannot be used interchangeably.  Results cannot be interpreted as absolute evidence of the presence or absence of malignant disease. Performed At: [facility] [place], [place] [id number] Rush Farmer MD IW:[id number] Performed at [facility], [address] [place], [place] [address]   Comprehensive metabolic panel     Status: Abnormal   Collection Time: 09/29/17  9:46 AM  Result Value Ref Range   Sodium 135 135 - 145 mmol/L   Potassium 3.6 3.5 - 5.1 mmol/L   Chloride 99 98 - 111 mmol/L    Comment: Please note change in reference range.   CO2 27 22 - 32 mmol/L   Glucose, Bld 131 (H) 70 - 99 mg/dL    Comment: Please note change in reference range.   BUN 19 8 - 23 mg/dL    Comment: Please note change in reference range.   Creatinine, Ser 0.88 0.44 - 1.00 mg/dL   Calcium 9.2 8.9 - 10.3 mg/dL   Total Protein 7.2 6.5 - 8.1 g/dL   Albumin 4.0 3.5 - 5.0 g/dL   AST 23 15 - 41 U/L   ALT 24 0 - 44 U/L    Comment: Please note change in reference range.   Alkaline Phosphatase 71 38 - 126 U/L   Total Bilirubin 0.5 0.3 - 1.2 mg/dL   GFR calc non Af Amer >60 >60 mL/min   GFR calc Af Amer >60 >60 mL/min    Comment: (NOTE) The eGFR has been calculated using the CKD EPI equation. This calculation has not been validated in all clinical situations. eGFR's persistently <60 mL/min signify possible Chronic Kidney Disease.    Anion gap 9 5 - 15    Comment: Performed at [facility], [address] [place],  [address]  CBC with Differential     Status: Abnormal   Collection Time: 09/29/17  9:46 AM  Result Value Ref Range   WBC 7.6 3.6 - 11.0 K/uL   RBC 3.42 (L) 3.80 - 5.20 MIL/uL   Hemoglobin 12.0 12.0 - 16.0 g/dL   HCT 34.1 (L) 35.0 - 47.0 %   MCV 99.7 80.0 - 100.0 fL   MCH  35.1 (H) 26.0 - 34.0 pg   MCHC 35.2 32.0 - 36.0 g/dL   RDW 14.3 11.5 - 14.5 %   Platelets 213 150 - 440 K/uL   Neutrophils  Relative % 79 %   Neutro Abs 6.0 1.4 - 6.5 K/uL   Lymphocytes Relative 15 %   Lymphs Abs 1.1 1.0 - 3.6 K/uL   Monocytes Relative 6 %   Monocytes Absolute 0.5 0.2 - 0.9 K/uL   Eosinophils Relative 0 %   Eosinophils Absolute 0.0 0 - 0.7 K/uL   Basophils Relative 0 %   Basophils Absolute 0.0 0 - 0.1 K/uL    Comment: Performed at [facility], [address] [place], [place] [address]  CA 125     Status: Abnormal   Collection Time: 09/29/17  9:46 AM  Result Value Ref Range   Cancer Antigen (CA) 125 143.5 (H) 0.0 - 38.1 U/mL    Comment: (NOTE) Roche [employer]ostics Electrochemiluminescence Immunoassay (ECLIA) Values obtained with different assay methods or kits cannot be used interchangeably.  Results cannot be interpreted as absolute evidence of the presence or absence of malignant disease. Performed At: [facility] [place], [place] [id number] Rush Farmer MD DJ:[id number]   Urinalysis, Complete w Microscopic     Status: Abnormal   Collection Time: 09/29/17 11:17 AM  Result Value Ref Range   Color, Urine YELLOW (A) YELLOW   APPearance HAZY (A) CLEAR   Specific Gravity, Urine 1.021 1.005 - 1.030   pH 5.0 5.0 - 8.0   Glucose, UA NEGATIVE NEGATIVE mg/dL   Hgb urine dipstick NEGATIVE NEGATIVE   Bilirubin Urine NEGATIVE NEGATIVE   Ketones, ur NEGATIVE NEGATIVE mg/dL   Protein, ur NEGATIVE NEGATIVE mg/dL   Nitrite NEGATIVE NEGATIVE   Leukocytes, UA NEGATIVE NEGATIVE    Comment: Performed at [facility], [address] [place], [place] [address]  Comprehensive metabolic panel     Status: Abnormal   Collection Time: 10/13/17  8:53 AM  Result Value Ref Range   Sodium 138 135 - 145 mmol/L   Potassium 3.9 3.5 - 5.1 mmol/L   Chloride 102 98 - 111 mmol/L   CO2 28 22 - 32 mmol/L   Glucose, Bld 77 70 - 99 mg/dL   BUN 16 8  - 23 mg/dL   Creatinine, Ser 0.71 0.44 - 1.00 mg/dL   Calcium 8.8 (L) 8.9 - 10.3 mg/dL   Total Protein 6.9 6.5 - 8.1 g/dL   Albumin 3.6 3.5 - 5.0 g/dL   AST 24 15 - 41 U/L   ALT 26 0 - 44 U/L   Alkaline Phosphatase 73 38 - 126 U/L   Total Bilirubin 0.5 0.3 - 1.2 mg/dL   GFR calc non Af Amer >60 >60 mL/min   GFR calc Af Amer >60 >60 mL/min    Comment: (NOTE) The eGFR has been calculated using the CKD EPI equation. This calculation has not been validated in all clinical situations. eGFR's persistently <60 mL/min signify possible Chronic Kidney Disease.    Anion gap 8 5 - 15    Comment: Performed at [facility], [address] [place],  [address]  CBC with Differential     Status: Abnormal   Collection Time: 10/13/17  8:53 AM  Result Value Ref Range   WBC 7.1 3.6 - 11.0 K/uL   RBC 3.58 (L) 3.80 - 5.20 MIL/uL   Hemoglobin 12.4 12.0 - 16.0 g/dL   HCT 35.7 35.0 - 47.0 %   MCV 99.9 80.0 - 100.0 fL   MCH 34.7 (H) 26.0 - 34.0 pg   MCHC 34.7 32.0 - 36.0 g/dL   RDW 14.8 (H) 11.5 - 14.5 %  Platelets 222 150 - 440 K/uL   Neutrophils Relative % 47 %   Neutro Abs 3.4 1.4 - 6.5 K/uL   Lymphocytes Relative 38 %   Lymphs Abs 2.7 1.0 - 3.6 K/uL   Monocytes Relative 13 %   Monocytes Absolute 0.9 0.2 - 0.9 K/uL   Eosinophils Relative 1 %   Eosinophils Absolute 0.0 0 - 0.7 K/uL   Basophils Relative 1 %   Basophils Absolute 0.1 0 - 0.1 K/uL    Comment: Performed at [facility], [address] [place], [place] [address]  [place] [address]     Status: Abnormal   Collection Time: 10/13/17  8:53 AM  Result Value Ref Range   Cancer Antigen (CA) 125 94.0 (H) 0.0 - 38.1 U/mL    Comment: (NOTE) Roche [employer]ostics Electrochemiluminescence Immunoassay (ECLIA) Values obtained with different assay methods or kits cannot be used interchangeably.  Results cannot be interpreted as absolute evidence of the presence or absence of malignant disease. Performed At: [facility] [place], [place] [id number] Rush Farmer MD OE:[id number]   Protein, urine, random     Status: None   Collection Time: 10/13/17  9:00 AM  Result Value Ref Range   Total Protein, Urine 10 mg/dL    Comment: NO NORMAL RANGE ESTABLISHED FOR THIS TEST Performed at [facility], [address] [place], [place] [address]   Protein, urine, random     Status: None   Collection Time: 10/27/17  9:15 AM  Result Value Ref Range   Total Protein, Urine 7 mg/dL    Comment: NO NORMAL RANGE ESTABLISHED FOR THIS TEST Performed at [facility], [address] [place], [place] [address]   Comprehensive metabolic panel     Status: None   Collection Time: 10/27/17  9:18 AM  Result Value Ref Range   Sodium 136 135 - 145 mmol/L   Potassium 3.7 3.5 - 5.1 mmol/L   Chloride 99 98 - 111 mmol/L   CO2 26 22 - 32 mmol/L   Glucose, Bld 99 70 - 99 mg/dL   BUN 14 8 - 23 mg/dL   Creatinine, Ser 0.64 0.44 - 1.00 mg/dL   Calcium 9.1 8.9 - 10.3 mg/dL   Total Protein 7.1 6.5 - 8.1 g/dL   Albumin 3.5 3.5 - 5.0 g/dL   AST 32 15 - 41 U/L   ALT 42 0 - 44 U/L   Alkaline Phosphatase 87 38 - 126 U/L   Total Bilirubin 0.3 0.3 - 1.2 mg/dL   GFR calc non Af Amer >60 >60 mL/min   GFR calc Af Amer >60 >60 mL/min    Comment: (NOTE) The eGFR has been calculated using the CKD EPI equation. This calculation has not been validated in all clinical situations. eGFR's persistently <60 mL/min signify possible Chronic Kidney Disease.    Anion gap 11 5 - 15    Comment: Performed at [facility], [address] [place], [place] [address]  CBC with Differential     Status: Abnormal   Collection Time: 10/27/17  9:18 AM  Result Value Ref Range   WBC 3.5 (L) 3.6 - 11.0 K/uL   RBC 3.47 (L) 3.80 - 5.20 MIL/uL   Hemoglobin 12.1 12.0 - 16.0 g/dL   HCT 34.7 (L) 35.0 - 47.0 %   MCV 100.0 80.0 - 100.0 fL   MCH 34.9 (H) 26.0 - 34.0 pg   MCHC 34.9 32.0 - 36.0 g/dL   RDW 14.4  11.5 - 14.5 %    Platelets 201 150 - 440 K/uL   Neutrophils Relative % 48 %   Neutro Abs 1.7 1.4 - 6.5 K/uL   Lymphocytes Relative 39 %   Lymphs Abs 1.3 1.0 - 3.6 K/uL   Monocytes Relative 8 %   Monocytes Absolute 0.3 0.2 - 0.9 K/uL   Eosinophils Relative 4 %   Eosinophils Absolute 0.1 0 - 0.7 K/uL   Basophils Relative 1 %   Basophils Absolute 0.0 0 - 0.1 K/uL    Comment: Performed at [facility], [address] [place], [place] [address]  [place] [address]     Status: Abnormal   Collection Time: 10/27/17  9:18 AM  Result Value Ref Range   Cancer Antigen (CA) 125 113.0 (H) 0.0 - 38.1 U/mL    Comment: (NOTE) Roche [employer]ostics Electrochemiluminescence Immunoassay (ECLIA) Values obtained with different assay methods or kits cannot be used interchangeably.  Results cannot be interpreted as absolute evidence of the presence or absence of malignant disease. Performed At: [facility] [place], [place] [id number] Rush Farmer MD AO:[id number]   CA 125     Status: Abnormal   Collection Time: 11/10/17  9:08 AM  Result Value Ref Range   Cancer Antigen (CA) 125 114.0 (H) 0.0 - 38.1 U/mL    Comment: (NOTE) [employer] Electrochemiluminescence Immunoassay (ECLIA) Values obtained with different assay methods or kits cannot be used interchangeably.  Results cannot be interpreted as absolute evidence of the presence or absence of malignant disease. Performed At: [facility] [place], [place] [id number] Rush Farmer MD [id number]   Comprehensive metabolic panel     Status: Abnormal   Collection Time: 11/10/17  9:08 AM  Result Value Ref Range   Sodium 136 135 - 145 mmol/L   Potassium 4.0 3.5 - 5.1 mmol/L   Chloride 99 98 - 111 mmol/L   CO2 26 22 - 32 mmol/L   Glucose, Bld 152 (H) 70 - 99 mg/dL   BUN 15 8 - 23 mg/dL   Creatinine, Ser 0.82 0.44 - 1.00 mg/dL   Calcium 9.2 8.9 - 10.3 mg/dL   Total Protein 7.4 6.5 - 8.1 g/dL   Albumin 3.4 (L) 3.5 -  5.0 g/dL   AST 24 15 - 41 U/L   ALT 21 0 - 44 U/L   Alkaline Phosphatase 77 38 - 126 U/L   Total Bilirubin 0.4 0.3 - 1.2 mg/dL   GFR calc non Af Amer >60 >60 mL/min   GFR calc Af Amer >60 >60 mL/min    Comment: (NOTE) The eGFR has been calculated using the CKD EPI equation. This calculation has not been validated in all clinical situations. eGFR's persistently <60 mL/min signify possible Chronic Kidney Disease.    Anion gap 11 5 - 15    Comment: Performed at [facility], [address] [place], [place] [address]  CBC with Differential     Status: Abnormal   Collection Time: 11/10/17  9:08 AM  Result Value Ref Range   WBC 4.3 3.6 - 11.0 K/uL   RBC 3.41 (L) 3.80 - 5.20 MIL/uL   Hemoglobin 11.9 (L) 12.0 - 16.0 g/dL   HCT 34.0 (L) 35.0 - 47.0 %   MCV 99.6 80.0 - 100.0 fL   MCH 34.9 (H) 26.0 - 34.0 pg   MCHC 35.1 32.0 - 36.0 g/dL   RDW 13.7 11.5 - 14.5 %   Platelets 248 150 - 440  K/uL   Neutrophils Relative % 65 %   Neutro Abs 2.8 1.4 - 6.5 K/uL   Lymphocytes Relative 18 %   Lymphs Abs 0.8 (L) 1.0 - 3.6 K/uL   Monocytes Relative 13 %   Monocytes Absolute 0.5 0.2 - 0.9 K/uL   Eosinophils Relative 3 %   Eosinophils Absolute 0.1 0 - 0.7 K/uL   Basophils Relative 1 %   Basophils Absolute 0.0 0 - 0.1 K/uL    Comment: Performed at [facility], [address] [place], [place] [address]  Protein, urine, random     Status: None   Collection Time: 11/10/17 10:30 AM  Result Value Ref Range   Total Protein, Urine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

## 2017-12-04 LAB — TSH: TSH: 3.96 mIU/L (ref 0.40–4.50)

## 2017-12-05 ENCOUNTER — Emergency Department: Payer: PPO

## 2017-12-05 ENCOUNTER — Inpatient Hospital Stay: Payer: PPO

## 2017-12-05 ENCOUNTER — Other Ambulatory Visit: Payer: Self-pay

## 2017-12-05 ENCOUNTER — Encounter: Payer: Self-pay | Admitting: Emergency Medicine

## 2017-12-05 ENCOUNTER — Emergency Department
Admission: EM | Admit: 2017-12-05 | Discharge: 2017-12-05 | Disposition: A | Discharge status: Home / Self Care | Payer: PPO | Attending: Emergency Medicine | Admitting: Emergency Medicine

## 2017-12-05 DIAGNOSIS — R079 Chest pain, unspecified: Secondary | ICD-10-CM | POA: Diagnosis not present

## 2017-12-05 DIAGNOSIS — R0602 Shortness of breath: Secondary | ICD-10-CM | POA: Diagnosis not present

## 2017-12-05 DIAGNOSIS — J45909 Unspecified asthma, uncomplicated: Secondary | ICD-10-CM | POA: Diagnosis not present

## 2017-12-05 DIAGNOSIS — Z87891 Personal history of nicotine dependence: Secondary | ICD-10-CM | POA: Diagnosis not present

## 2017-12-05 DIAGNOSIS — Z79899 Other long term (current) drug therapy: Secondary | ICD-10-CM | POA: Insufficient documentation

## 2017-12-05 DIAGNOSIS — J189 Pneumonia, unspecified organism: Secondary | ICD-10-CM | POA: Insufficient documentation

## 2017-12-05 DIAGNOSIS — C569 Malignant neoplasm of unspecified ovary: Secondary | ICD-10-CM | POA: Diagnosis not present

## 2017-12-05 DIAGNOSIS — R1013 Epigastric pain: Secondary | ICD-10-CM | POA: Diagnosis not present

## 2017-12-05 LAB — URINALYSIS, COMPLETE (UACMP) WITH MICROSCOPIC
Bilirubin Urine: NEGATIVE
Glucose, UA: NEGATIVE mg/dL
HGB URINE DIPSTICK: NEGATIVE
Ketones, ur: NEGATIVE mg/dL
Leukocytes, UA: NEGATIVE
NITRITE: NEGATIVE
PH: 7 (ref 5.0–8.0)
Protein, ur: NEGATIVE mg/dL

## 2017-12-05 LAB — BASIC METABOLIC PANEL
Anion gap: 14 (ref 5–15)
BUN: 23 mg/dL (ref 8–23)
CALCIUM: 9.1 mg/dL (ref 8.9–10.3)
CHLORIDE: 88 mmol/L — AB (ref 98–111)
CO2: 29 mmol/L (ref 22–32)
CREATININE: 1.17 mg/dL — AB (ref 0.44–1.00)
GFR calc Af Amer: 57 mL/min — ABNORMAL LOW (ref >60)
GFR calc non Af Amer: 49 mL/min — ABNORMAL LOW (ref >60)
GLUCOSE: 127 mg/dL — AB (ref 70–99)
Potassium: 4.6 mmol/L (ref 3.5–5.1)
Sodium: 131 mmol/L — ABNORMAL LOW (ref 135–145)

## 2017-12-05 LAB — CBC
HCT: 34.1 % — ABNORMAL LOW (ref 35.0–47.0)
Hemoglobin: 12.3 g/dL (ref 12.0–16.0)
MCH: 35 pg — AB (ref 26.0–34.0)
MCHC: 36.1 g/dL — AB (ref 32.0–36.0)
MCV: 96.9 fL (ref 80.0–100.0)
PLATELETS: 214 10*3/uL (ref 150–440)
RBC: 3.52 MIL/uL — ABNORMAL LOW (ref 3.80–5.20)
RDW: 14.2 % (ref 11.5–14.5)
WBC: 3.5 10*3/uL — ABNORMAL LOW (ref 3.6–11.0)

## 2017-12-05 LAB — INFLUENZA PANEL BY PCR (TYPE A & B)
Influenza A By PCR: NEGATIVE
Influenza B By PCR: NEGATIVE

## 2017-12-05 LAB — HEPATIC FUNCTION PANEL
ALBUMIN: 3.5 g/dL (ref 3.5–5.0)
ALK PHOS: 86 U/L (ref 38–126)
ALT: 67 U/L — AB (ref 0–44)
AST: 52 U/L — ABNORMAL HIGH (ref 15–41)
BILIRUBIN TOTAL: 0.6 mg/dL (ref 0.3–1.2)
Bilirubin, Direct: <0.1 mg/dL (ref 0.0–0.2)
TOTAL PROTEIN: 7.1 g/dL (ref 6.5–8.1)

## 2017-12-05 LAB — TROPONIN I: Troponin I: <0.03 ng/mL (ref <0.03)

## 2017-12-05 MED ORDER — IOHEXOL 350 MG/ML SOLN
75.0000 mL | Freq: Once | INTRAVENOUS | Status: AC | PRN
  Administered 2017-12-05: 75 mL via INTRAVENOUS
  Filled 2017-12-05: qty 75

## 2017-12-05 MED ORDER — SODIUM CHLORIDE 0.9 % IV BOLUS
1000.0000 mL | Freq: Once | INTRAVENOUS | Status: AC
  Administered 2017-12-05: 1000 mL via INTRAVENOUS

## 2017-12-05 MED ORDER — LEVOFLOXACIN 500 MG PO TABS: 500.0000 mg | ORAL_TABLET | Freq: Every day | ORAL | 7 refills | Status: DC

## 2017-12-05 MED ORDER — LEVOFLOXACIN 500 MG PO TABS
500.0000 mg | ORAL_TABLET | Freq: Once | ORAL | Status: AC
  Administered 2017-12-05: 500 mg via ORAL
  Filled 2017-12-05: qty 1

## 2017-12-05 NOTE — ED Triage Notes (Signed)
Pt reports has ovarian cancer stage 3 and was headed to have blood work done for Dr. Ronnell Freshwater when she stared with severe CP, SOB and nausea. Pt reports pain is sharp, stabbing and shooting.

## 2017-12-05 NOTE — ED Triage Notes (Signed)
First Nurse Note:  Arrives from [facility] for ED evaluation of chest pain.  Onset of symptoms 45 min PTA  Patient is AAOx3.  Skin warm and dry. NAD

## 2017-12-05 NOTE — ED Notes (Signed)
See triage note  States she developed a "sharp" pain to epigastric this am states pain lasted about 45 mins.  Also states she may have been running a slight fever over the past fever  States apin has eased off at present

## 2017-12-05 NOTE — Progress Notes (Signed)
ZUZANNA MARONEY    [id number]   27-Feb-1956  Type of procedure: CT angio chest  12/05/2017  During your examination at [facility] some of the x-ray dye leaked into the tissues around the vein that the x-ray dye was given through.  What should you do?  1. Apply ice 20 minutes four times a day for three days.  2. Keep the affected extremity elevated above the rest of your body for 48 hours.  3. If you develop any of the following signs or symptoms, please contact Radiology at [phone].  Increased pain  Increasing swelling   Change in sensation (ex. Numbness, tingling)  Development of redness or increase in warmth around the affected area  Increasing hardness  Blistering   I have read and understand these instructions.  Any questions I raised have been discussed to my satisfaction.  I understand that failure to follow these instructions may result in additional complications.

## 2017-12-05 NOTE — ED Provider Notes (Signed)
[facility] Emergency Department Provider Note  ____________________________________________   First MD Initiated Contact with Patient 12/05/17 1519     (approximate)  I have reviewed the triage vital signs and the nursing notes.   HISTORY  Chief Complaint Chest Pain; Shortness of Breath; and Nausea    HPI Jessica Burgess is a 62 y.o. female presents emergency department complaining of sharp pain in the epigastric area this morning.  She states last about 45 minutes.  She states she did not get short of breath but became sweaty.  She has been running a slight fever over the past week.  She said it has been about 100 and she is waking up sweating.  She states the pain has eased off upon arrival.  She denies cough or congestion.  She denies abdominal pain.  She denies dysuria.  She is taking chemo at this time and is followed by Dr. Grayland Ormond.  She is being treated for ovarian cancer.    Past Medical History:  Diagnosis Date  . Allergic rhinitis, cause unspecified   . Anxiety state, unspecified   . Arthritis   . Asthma    only when sick   . Backache, unspecified   . Bronchitis    hx of when get sick  . Cancer ([place])    skin cancer , basal cell   . Cancer ([place]) 11/2016   ovarian  . Cervicalgia   . Complication of anesthesia   . Dermatophytosis of nail   . Dysmetabolic syndrome X   . Encounter for long-term (current) use of other medications   . Esophageal reflux   . Insomnia, unspecified   . Leukocytosis, unspecified   . Migraine without aura, without mention of intractable migraine without mention of status migrainosus   . Other and unspecified hyperlipidemia   . Other malaise and fatigue   . Overweight(278.02)   . Personal history of chemotherapy now   ovarian  . PONV (postoperative nausea and vomiting)   . Spinal stenosis in cervical region   . Symptomatic menopausal or female climacteric states   . Unspecified disorder of skin and  subcutaneous tissue   . Unspecified vitamin D deficiency     Patient Active Problem List   Diagnosis Date Noted  . 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 Mucositis due to chemotherapy 09/02/2017  . Goals of care, counseling/discussion 08/08/2017  . Hypothyroidism due to medication 07/28/2017  . Genetic testing 12/18/2016  . Migraines 12/01/2016  . Postoperative seroma of subcutaneous tissue after non-dermatologic procedure 12/01/2016  . Transaminitis 12/01/2016  . Anemia associated with acute blood loss 11/20/2016  . Ovarian cancer, unspecified laterality ([place]) 11/12/2016  . 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 Examination of participant in clinical trial 11/01/2016  . Chronic right-sided low back pain with right-sided sciatica 02/02/2016  . History of neck surgery 08/01/2015  . Hematoma 06/14/2015  . Perennial allergic rhinitis 05/05/2015  . Generalized anxiety disorder 02/03/2015  . Back pain, chronic 08/25/2014  . Insomnia, persistent 08/25/2014  . Chronic cervical pain 08/25/2014  . Major depression, chronic ([place]) 08/25/2014  . Dyslipidemia 08/25/2014  . Gastro-esophageal reflux disease without esophagitis 08/25/2014  . H/O high risk medication treatment 08/25/2014  . Blood glucose elevated 08/25/2014  . Migraine without aura and without status migrainosus, not intractable 08/25/2014  . Climacteric 08/25/2014  . Dysmetabolic syndrome 82/99/3716  . Fungal infection of toenail 08/25/2014  . Obesity (BMI 30.0-34.9) 08/25/2014  . Vitamin D  deficiency 08/25/2014  . Engages in travel abroad 08/25/2014  . Cervical disc disorder with radiculopathy 04/28/2013    Past Surgical History:  Procedure Laterality Date  . ABDOMINAL HYSTERECTOMY    . ANTERIOR CERVICAL DECOMP/DISCECTOMY FUSION N/A 06/07/2015   Procedure: Cervical three - four and Cervical six- seven anterior cervical decompression with fusion interbody prosthesis plating and bonegraft;   Surgeon: Newman Pies, MD;  Location: [facility];  Service: Neurosurgery;  Laterality: N/A;  C34 and C67 anterior cervical decompression with fusion interbody prosthesis plating and bonegraft  . BACK SURGERY     x2 Lower   . EVACUATION OF CERVICAL HEMATOMA N/A 06/14/2015   Procedure: EVACUATION OF CERVICAL HEMATOMA;  Surgeon: Newman Pies, MD;  Location: [facility];  Service: Neurosurgery;  Laterality: N/A;  . NECK SURGERY     x3  . TONSILLECTOMY      Prior to Admission medications   Medication Sig Start Date End Date Taking? Authorizing Provider  albuterol (PROVENTIL HFA;VENTOLIN HFA) 108 (90 Base) MCG/ACT inhaler Inhale 2 puffs into the lungs every 6 (six) hours as needed for wheezing or shortness of breath. 10/04/16   Hubbard Hartshorn, FNP  albuterol (PROVENTIL) (2.5 MG/3ML) 0.083% nebulizer solution Take 3 mLs (2.5 mg total) by nebulization every 6 (six) hours as needed for wheezing or shortness of breath. 05/05/15   Steele Sizer, MD  Ascorbic Acid (VITAMIN C PO) Take 1 tablet by mouth daily.     [provider]  budesonide-formoterol (SYMBICORT) 160-4.5 MCG/ACT inhaler Inhale 2 puffs into the lungs 2 (two) times daily.    [provider]  Cholecalciferol (VITAMIN D-1000 MAX ST) 1000 UNITS tablet Take 1 tablet by mouth 2 (two) times daily.    [provider]  Cyanocobalamin (B-12 PO) Take 1 tablet by mouth daily.     [provider]  cyclobenzaprine (FLEXERIL) 10 MG tablet TAKE 1 TABLET BY MOUTH THREE TIMES A DAY IF NEEDED FOR MUSCLE SPASM 06/27/17   Lloyd Huger, MD  DULoxetine (CYMBALTA) 60 MG capsule Take 1 capsule (60 mg total) by mouth daily. 10/28/17   Steele Sizer, MD  estradiol (ESTRACE) 0.5 MG tablet Take 1 tablet (0.5 mg total) by mouth daily. 10/28/17   Steele Sizer, MD  fentaNYL (DURAGESIC - DOSED MCG/HR) 50 MCG/HR Place 1 patch (50 mcg total) onto the skin every 3 (three) days. 11/28/17   Jacquelin Hawking, NP  ferrous  sulfate (IRON SUPPLEMENT) 325 (65 FE) MG tablet Take 1 tablet by mouth daily.    [provider]  fluticasone (FLONASE) 50 MCG/ACT nasal spray Place 2 sprays into both nostrils as needed. 11/12/16   Steele Sizer, MD  gabapentin (NEURONTIN) 300 MG capsule Take 1-3 capsules (300-900 mg total) by mouth 3 (three) times daily. 300 mg twice a day and 900 mg at night Patient taking differently: Take 900 mg by mouth 2 (two) times daily. 300 mg twice a day and 900 mg at night 08/25/17   Sowles, Drue Stager, MD  Galcanezumab-gnlm [facility]) 120 MG/ML SOSY Inject 120 mg into the skin every 30 (thirty) days. 12/03/17   Steele Sizer, MD  ipratropium (ATROVENT HFA) 17 MCG/ACT inhaler Inhale 2 puffs into the lungs every 6 (six) hours as needed.     [provider]  levofloxacin (LEVAQUIN) 500 MG tablet Take 1 tablet (500 mg total) by mouth daily. 12/05/17   Fisher, Linden Dolin, PA-C  lidocaine (XYLOCAINE) 2 % solution Use as directed 15 mLs in the mouth or  throat as needed for mouth pain. 11/28/17   Jacquelin Hawking, NP  lisinopril (PRINIVIL,ZESTRIL) 10 MG tablet Take 1 tablet (10 mg total) by mouth daily. 12/03/17   Steele Sizer, MD  Lysine 1000 MG TABS Take 1 tablet by mouth daily.     [provider]  magic mouthwash w/lidocaine SOLN Take 5 mLs by mouth 3 (three) times daily. 09/02/17   Verlon Au, NP  Magnesium Oxide 500 MG CAPS Take 500 mg by mouth 2 (two) times daily.     [provider]  Multiple Vitamins-Minerals (MULTIVITAMIN PO) Take 1 tablet by mouth daily.     [provider]  Omega-3 Fatty Acids (FISH OIL PO) Take 1 capsule by mouth 2 (two) times daily.     [provider]  oxyCODONE-acetaminophen (PERCOCET) 10-325 MG tablet Take 1 tablet by mouth every 8 (eight) hours as needed for pain. 11/24/17   Lloyd Huger, MD  potassium gluconate 595 MG TABS tablet Take 595 mg by mouth daily.    [provider]  SUMAtriptan (IMITREX) 100 MG  tablet May repeat in 2 hours if headache persists or recurs. 10/28/17   Steele Sizer, MD  temazepam (RESTORIL) 15 MG capsule Take 1 capsule (15 mg total) by mouth at bedtime as needed for sleep. 12/03/17   Steele Sizer, MD  triamcinolone (KENALOG) 0.1 % paste Use as directed 1 application in the mouth or throat 2 (two) times daily. 10/06/17   Verlon Au, NP  urea (CARMOL) 10 % cream Apply topically as needed. 10/06/17   Verlon Au, NP  valACYclovir (VALTREX) 1000 MG tablet TAKE 1 TABLET(1000 MG) BY MOUTH THREE TIMES DAILY FOR 7 DAYS 11/24/17   Lloyd Huger, MD  prochlorperazine (COMPAZINE) 10 MG tablet Take 1 tablet (10 mg total) by mouth every 6 (six) hours as needed (Nausea or vomiting). 12/02/16 08/08/17  Lloyd Huger, MD    Allergies Patient has no known allergies.  Family History  Problem Relation Age of Onset  . Depression Mother   . Migraines Mother   . Dementia Father   . Diabetes Father   . Hyperlipidemia Father   . Hyperlipidemia Brother   . Hyperlipidemia Brother   . Breast cancer Paternal Aunt        1s    Social History Social History   Tobacco Use  . Smoking status: Former Smoker    Packs/day: 1.50    Years: 20.00    Pack years: 30.00    Types: Cigarettes    Start date: 03/19/1979    Last attempt to quit: 08/25/1999    Years since quitting: 18.2  . Smokeless tobacco: Never Used  . Tobacco comment: smoking cessation materials not required  Substance Use Topics  . Alcohol use: Not Currently    Alcohol/week: 0.0 standard drinks  . Drug use: No    Review of Systems  Constitutional: Positive fever/chills Eyes: No visual changes. ENT: No sore throat. Respiratory: Denies cough or shortness of breath Cardiovascular: Positive for chest pain and epigastric pain Genitourinary: Negative for dysuria. Musculoskeletal: Negative for back pain. Skin: Negative for rash.    ____________________________________________   PHYSICAL EXAM:  VITAL  SIGNS: ED Triage Vitals  Enc Vitals Group     BP 12/05/17 1707 110/68     Pulse Rate 12/05/17 1707 80     Resp 12/05/17 1707 16     Temp 12/05/17 1707 99.7 F (37.6 C)     Temp Source 12/05/17  1707 Oral     SpO2 12/05/17 1707 98 %     Weight 12/05/17 1412 144 lb (65.3 kg)     Height 12/05/17 1412 5\' 2"  (1.575 m)     Head Circumference --      Peak Flow --      Pain Score 12/05/17 1412 4     Pain Loc --      Pain Edu? --      Excl. in [place]? --     Constitutional: Alert and oriented. Well appearing and in no acute distress. Eyes: Conjunctivae are normal.  Head: Atraumatic. Nose: No congestion/rhinnorhea. Mouth/Throat: Mucous membranes are moist.  The mouth has a few ulcers noted Neck:  supple no lymphadenopathy noted Cardiovascular: Normal rate, regular rhythm. Heart sounds are normal, no murmur rub gallop is noted Respiratory: Normal respiratory effort.  No retractions, lungs c t a  Abd: soft nontender bs normal all 4 quad GU: deferred Musculoskeletal: FROM all extremities, warm and well perfused, no calf tenderness is noted.  Negative Homans sign Neurologic:  Normal speech and language.  Skin:  Skin is warm, dry and intact. No rash noted. Psychiatric: Mood and affect are normal. Speech and behavior are normal.  ____________________________________________   LABS (all labs ordered are listed, but only abnormal results are displayed)  Labs Reviewed  BASIC METABOLIC PANEL - Abnormal; Notable for the following components:      Result Value   Sodium 131 (*)    Chloride 88 (*)    Glucose, Bld 127 (*)    Creatinine, Ser 1.17 (*)    GFR calc non Af Amer 49 (*)    GFR calc Af Amer 57 (*)    All other components within normal limits  CBC - Abnormal; Notable for the following components:   WBC 3.5 (*)    RBC 3.52 (*)    HCT 34.1 (*)    MCH 35.0 (*)    MCHC 36.1 (*)    All other components within normal limits  URINALYSIS, COMPLETE (UACMP) WITH MICROSCOPIC - Abnormal;  Notable for the following components:   Color, Urine YELLOW (*)    APPearance CLEAR (*)    Specific Gravity, Urine >1.046 (*)    Bacteria, UA RARE (*)    All other components within normal limits  HEPATIC FUNCTION PANEL - Abnormal; Notable for the following components:   AST 52 (*)    ALT 67 (*)    All other components within normal limits  URINE CULTURE  TROPONIN I  INFLUENZA PANEL BY PCR (TYPE A & B)  TROPONIN I   ____________________________________________   ____________________________________________  RADIOLOGY  Chest x-ray is negative for any acute abnormality CT of the chest is negative for PE but does show a groundglass appearance in 1 of the upper lobes bilaterally.  Concerns of infection versus inflammatory change.  There is new hilar lymphadenopathy noted  ____________________________________________   PROCEDURES  Procedure(s) performed: Saline lock, 1 L normal saline IV, Levaquin 500 mg p.o.  Procedures    ____________________________________________   INITIAL IMPRESSION / ASSESSMENT AND PLAN / ED COURSE  Pertinent labs & imaging results that were available during my care of the patient were reviewed by me and considered in my medical decision making (see chart for details).   Patient is a 62 year old female presents emergency department complaining of chest pain and epigastric pain which started this morning.  She states the pain has eased at this time.  She denies shortness of breath,  nausea or vomiting at this time.  On physical exam the patient appears well.  Lungs are clear to auscultation and heart sounds are normal.  Abdomen is not tender.    The EKG does not show a STEMI but shows some left atrial enlargement.   Chest x-ray is negative CT the chest is negative for PE.  However it does show a groundglass hazy appearance in the upper lobes which could indicate an infection. CBC has a WBC of 3.5; basic metabolic panel has a chloride of 88, glucose  127, and creatinine of 1.17, sodium is decreased at 131.  Hepatic panel has some elevation at the AST and ALT.  UA is negative.  Influenza test is negative.  Troponin I is negative at 0.03 Troponin II is pending; troponin II is 0.03  Discussed the case with Dr. Burlene Arnt.  He instructed me to call the on-call oncologist.  The oncologist is Dr. Rogue Bussing.  He states that he feels patient will be able to go home as long as her second troponin is negative.  He states he would like for her to be started on Levaquin 500 mg p.o. for 7 days.  She is to follow-up with Dr. Woodfin Ganja again early next week.  As part of my medical decision making, I reviewed the following data within the [facility] notes reviewed and incorporated, Labs reviewed troponins 1 and 2 are both negative, hepatic panel has elevated AST and  ALT, urinalysis is negative, influenza is negative basic metabolic panel has elevated creatinine at 1.17 EKG interpreted NSR, Old chart reviewed, Radiograph reviewed chest x-rays negative, CT of the chest is negative for PE but shows questionable pneumonia in the upper lungs, A consult was requested and obtained from this/these consultant(s) Medicine, oncology, Notes from prior ED visits and [place] Controlled Substance Database  ____________________________________________   FINAL CLINICAL IMPRESSION(S) / ED DIAGNOSES  Final diagnoses:  Community acquired pneumonia, unspecified laterality  Nonspecific chest pain      NEW MEDICATIONS STARTED DURING THIS VISIT:  Discharge Medication List as of 12/05/2017  6:09 PM    START taking these medications   Details  levofloxacin (LEVAQUIN) 500 MG tablet Take 1 tablet (500 mg total) by mouth daily., Starting Fri 12/05/2017, Normal         Note:  This document was prepared using Dragon voice recognition software and may include unintentional dictation errors.    Versie Starks, PA-C 12/05/17 [place], [place],  MD 12/05/17 [phone]

## 2017-12-05 NOTE — Discharge Instructions (Addendum)
Follow-up with Dr. Grayland Ormond on Monday for your already scheduled appointment.  Take the medication as prescribed.  Return to the emergency department if you are worsening.  If you develop more chest pain or shortness of breath return immediately.  Take over-the-counter medication for fever as needed.

## 2017-12-05 NOTE — Progress Notes (Signed)
This patient has received 70 ml's of IV omni 350 (type of contrast) contrast extravasation into right arm (part of body) during a CT angio chest exam.  The exam was performed on (date) 9-20.  Site / affected area assessed by Dr Posey Pronto

## 2017-12-07 LAB — URINE CULTURE

## 2017-12-07 NOTE — Progress Notes (Signed)
[place]  [phone] [phone]) [phone]  ID: Jessica Burgess OB: 03-22-1955  MR#: [id number]  BHA#:[id number]  Patient Care Team: Steele Sizer, MD as PCP - General (Family Medicine) Lloyd Huger, MD as Consulting Physician (Oncology) Mellody Drown, MD as Consulting Physician (Obstetrics and Gynecology) Cathi Roan, [facility] (Pharmacist) Clerance Lav, RN as Case Manager  CHIEF COMPLAINT: Stage IIIc high-grade serous ovarian carcinoma  INTERVAL HISTORY: Patient returns to clinic today for further evaluation and consideration of cycle 5, day 1 of Doxil plus Avastin.  She was recently in the ER with chest pain and shortness of breath and was diagnosed with pneumonia.  She is currently taking Levaquin with improvement of her symptoms.  She has increased weakness and fatigue and her performance status is declining.  She continues to have erythema and skin peeling on the palms of her hands and soles of her feet.  She denies any fevers.  She continues to have significant peripheral neuropathy. She has no other neurologic complaints.  She has a poor appetite.  She denies any nausea, vomiting, constipation, or diarrhea.  She denies any abdominal pain or bloating.  She has no urinary complaints.  Patient offers no further specific complaints today.  REVIEW OF SYSTEMS:   Review of Systems  Constitutional: Positive for malaise/fatigue. Negative for fever and weight loss.  HENT: Negative.  Negative for sore throat.   Respiratory: Negative.  Negative for cough and shortness of breath.   Cardiovascular: Negative.  Negative for chest pain and leg swelling.  Gastrointestinal: Negative.  Negative for abdominal pain, blood in stool, constipation, diarrhea, melena, nausea and vomiting.  Genitourinary: Negative.  Negative for dysuria.  Musculoskeletal: Negative.  Negative for back pain, joint pain and neck pain.       Hand and foot pain.  Skin: Negative.   Negative for itching and rash.  Neurological: Positive for tingling, sensory change and weakness. Negative for focal weakness.  Psychiatric/Behavioral: Negative.  The patient is not nervous/anxious.     As per HPI. Otherwise, a complete review of systems is negative.  PAST MEDICAL HISTORY: Past Medical History:  Diagnosis Date  . Allergic rhinitis, cause unspecified   . Anxiety state, unspecified   . Arthritis   . Asthma    only when sick   . Backache, unspecified   . Bronchitis    hx of when get sick  . Cancer ([place])    skin cancer , basal cell   . Cancer ([place]) 11/2016   ovarian  . Cervicalgia   . Complication of anesthesia   . Dermatophytosis of nail   . Dysmetabolic syndrome X   . Encounter for long-term (current) use of other medications   . Esophageal reflux   . Insomnia, unspecified   . Leukocytosis, unspecified   . Migraine without aura, without mention of intractable migraine without mention of status migrainosus   . Other and unspecified hyperlipidemia   . Other malaise and fatigue   . Overweight(278.02)   . Personal history of chemotherapy now   ovarian  . PONV (postoperative nausea and vomiting)   . Spinal stenosis in cervical region   . Symptomatic menopausal or female climacteric states   . Unspecified disorder of skin and subcutaneous tissue   . Unspecified vitamin D deficiency     PAST SURGICAL HISTORY: Past Surgical History:  Procedure Laterality Date  . ABDOMINAL HYSTERECTOMY    . ANTERIOR CERVICAL DECOMP/DISCECTOMY FUSION N/A 06/07/2015   Procedure: Cervical three -  four and Cervical six- seven anterior cervical decompression with fusion interbody prosthesis plating and bonegraft;  Surgeon: Newman Pies, MD;  Location: [place] [facility];  Service: Neurosurgery;  Laterality: N/A;  C34 and C67 anterior cervical decompression with fusion interbody prosthesis plating and bonegraft  . BACK SURGERY     x2 Lower   . EVACUATION OF CERVICAL HEMATOMA N/A  06/14/2015   Procedure: EVACUATION OF CERVICAL HEMATOMA;  Surgeon: Newman Pies, MD;  Location: [facility];  Service: Neurosurgery;  Laterality: N/A;  . NECK SURGERY     x3  . TONSILLECTOMY      FAMILY HISTORY: Family History  Problem Relation Age of Onset  . Depression Mother   . Migraines Mother   . Dementia Father   . Diabetes Father   . Hyperlipidemia Father   . Hyperlipidemia Brother   . Hyperlipidemia Brother   . Breast cancer Paternal Aunt        71s    ADVANCED DIRECTIVES (Y/N):  N  HEALTH MAINTENANCE: Social History   Tobacco Use  . Smoking status: Former Smoker    Packs/day: 1.50    Years: 20.00    Pack years: 30.00    Types: Cigarettes    Start date: 03/19/1979    Last attempt to quit: 08/25/1999    Years since quitting: 18.3  . Smokeless tobacco: Never Used  . Tobacco comment: smoking cessation materials not required  Substance Use Topics  . Alcohol use: Not Currently    Alcohol/week: 0.0 standard drinks  . Drug use: No     Colonoscopy:  PAP:  Bone density:  Lipid panel:  No Known Allergies  Current Outpatient Medications  Medication Sig Dispense Refill  . Ascorbic Acid (VITAMIN C PO) Take 1 tablet by mouth daily.     . budesonide-formoterol (SYMBICORT) 160-4.5 MCG/ACT inhaler Inhale 2 puffs into the lungs 2 (two) times daily.    . Cholecalciferol (VITAMIN D-1000 MAX ST) 1000 UNITS tablet Take 1 tablet by mouth 2 (two) times daily.    . Cyanocobalamin (B-12 PO) Take 1 tablet by mouth daily.     . DULoxetine (CYMBALTA) 60 MG capsule Take 1 capsule (60 mg total) by mouth daily. 90 capsule 0  . estradiol (ESTRACE) 0.5 MG tablet Take 1 tablet (0.5 mg total) by mouth daily. 90 tablet 4  . fentaNYL (DURAGESIC - DOSED MCG/HR) 50 MCG/HR Place 1 patch (50 mcg total) onto the skin every 3 (three) days. 10 patch 0  . ferrous sulfate (IRON SUPPLEMENT) 325 (65 FE) MG tablet Take 1 tablet by mouth daily.    . fluticasone (FLONASE) 50 MCG/ACT nasal spray Place  2 sprays into both nostrils as needed. 16 g 5  . gabapentin (NEURONTIN) 300 MG capsule Take 1-3 capsules (300-900 mg total) by mouth 3 (three) times daily. 300 mg twice a day and 900 mg at night (Patient taking differently: Take 900 mg by mouth 2 (two) times daily. 300 mg twice a day and 900 mg at night) 540 capsule 1  . Galcanezumab-gnlm (EMGALITY) 120 MG/ML SOSY Inject 120 mg into the skin every 30 (thirty) days. 1 mL 12  . levofloxacin (LEVAQUIN) 500 MG tablet Take 1 tablet (500 mg total) by mouth daily. 500 tablet 7  . lidocaine (XYLOCAINE) 2 % solution Use as directed 15 mLs in the mouth or throat as needed for mouth pain. 100 mL 0  . lisinopril (PRINIVIL,ZESTRIL) 10 MG tablet Take 1 tablet (10 mg total) by mouth daily. 90 tablet  0  . Lysine 1000 MG TABS Take 1 tablet by mouth daily.     . magic mouthwash w/lidocaine SOLN Take 5 mLs by mouth 3 (three) times daily. 315 mL 0  . Magnesium Oxide 500 MG CAPS Take 500 mg by mouth 2 (two) times daily.     . Multiple Vitamins-Minerals (MULTIVITAMIN PO) Take 1 tablet by mouth daily.     . Omega-3 Fatty Acids (FISH OIL PO) Take 1 capsule by mouth 2 (two) times daily.     Marland Kitchen oxyCODONE-acetaminophen (PERCOCET) 10-325 MG tablet Take 1 tablet by mouth every 8 (eight) hours as needed for pain. 90 tablet 0  . potassium gluconate 595 MG TABS tablet Take 595 mg by mouth daily.    . temazepam (RESTORIL) 15 MG capsule Take 1 capsule (15 mg total) by mouth at bedtime as needed for sleep. 30 capsule 2  . triamcinolone (KENALOG) 0.1 % paste Use as directed 1 application in the mouth or throat 2 (two) times daily. 5 g 2  . urea (CARMOL) 10 % cream Apply topically as needed. 410 g 1  . valACYclovir (VALTREX) 1000 MG tablet TAKE 1 TABLET(1000 MG) BY MOUTH THREE TIMES DAILY FOR 7 DAYS 21 tablet 0  . albuterol (PROVENTIL HFA;VENTOLIN HFA) 108 (90 Base) MCG/ACT inhaler Inhale 2 puffs into the lungs every 6 (six) hours as needed for wheezing or shortness of breath. (Patient  not taking: Reported on 12/08/2017) 1 Inhaler 2  . albuterol (PROVENTIL) (2.5 MG/3ML) 0.083% nebulizer solution Take 3 mLs (2.5 mg total) by nebulization every 6 (six) hours as needed for wheezing or shortness of breath. (Patient not taking: Reported on 12/08/2017) 75 mL 2  . cyclobenzaprine (FLEXERIL) 10 MG tablet TAKE 1 TABLET BY MOUTH THREE TIMES A DAY IF NEEDED FOR MUSCLE SPASM (Patient not taking: Reported on 12/08/2017) 30 tablet 0  . ipratropium (ATROVENT HFA) 17 MCG/ACT inhaler Inhale 2 puffs into the lungs every 6 (six) hours as needed.     . SUMAtriptan (IMITREX) 100 MG tablet May repeat in 2 hours if headache persists or recurs. (Patient not taking: Reported on 12/08/2017) 27 tablet 0   No current facility-administered medications for this visit.    Facility-Administered Medications Ordered in Other Visits  Medication Dose Route Frequency Provider Last Rate Last Dose  . 0.9 %  sodium chloride infusion   Intravenous Once Lloyd Huger, MD      . 0.9 %  sodium chloride infusion   Intravenous Once Lloyd Huger, MD      . dexamethasone (DECADRON) 20 mg in sodium chloride 0.9 % 50 mL IVPB  20 mg Intravenous Once Lloyd Huger, MD      . dexamethasone (DECADRON) injection 10 mg  10 mg Intravenous Once Lloyd Huger, MD      . sodium chloride flush (NS) 0.9 % injection 10 mL  10 mL Intravenous PRN Lloyd Huger, MD   10 mL at 06/16/17 0854    OBJECTIVE: Vitals:   12/08/17 1028  BP: 107/70  Pulse: (!) 109  Resp: 18  Temp: 98.1 F (36.7 C)     Body mass index is 27.03 kg/m.    ECOG FS:2 - Symptomatic, <50% confined to bed  General: Ill-appearing, no acute distress. Eyes: Pink conjunctiva, anicteric sclera. HEENT: Normocephalic, moist mucous membranes, clear oropharnyx.  No ulceration seen. Lungs: Clear to auscultation bilaterally. Heart: Regular rate and rhythm. No rubs, murmurs, or gallops. Abdomen: Soft, nontender, nondistended. No organomegaly noted,  normoactive bowel sounds. Musculoskeletal: No edema, cyanosis, or clubbing. Neuro: Alert, answering all questions appropriately. Cranial nerves grossly intact. Skin: No rashes or petechiae noted.  Palms and soles of feet with peeling skin and mild erythema. Psych: Normal affect.  LAB RESULTS:  Lab Results  Component Value Date   NA 129 (L) 12/08/2017   K 3.9 12/08/2017   CL 89 (L) 12/08/2017   CO2 27 12/08/2017   GLUCOSE 136 (H) 12/08/2017   BUN 14 12/08/2017   CREATININE 1.13 (H) 12/08/2017   CALCIUM 9.0 12/08/2017   PROT 7.0 12/08/2017   ALBUMIN 3.3 (L) 12/08/2017   AST 28 12/08/2017   ALT 31 12/08/2017   ALKPHOS 76 12/08/2017   BILITOT 0.6 12/08/2017   GFRNONAA 51 (L) 12/08/2017   GFRAA 59 (L) 12/08/2017    Lab Results  Component Value Date   WBC 3.6 12/08/2017   NEUTROABS 2.0 12/08/2017   HGB 11.3 (L) 12/08/2017   HCT 31.8 (L) 12/08/2017   MCV 97.3 12/08/2017   PLT 218 12/08/2017     STUDIES: Dg Chest 2 View  Result Date: 12/05/2017 CLINICAL DATA:  Indigestion and chest pressure.  Ovarian cancer. EXAM: CHEST - 2 VIEW COMPARISON:  Radiographs 10/04/2016.  CT 06/19/2017. FINDINGS: Right IJ Port-A-Cath tip appears unchanged at the mid SVC level. The heart size and mediastinal contours are stable. The lungs appear clear. There is no pleural effusion or pneumothorax. Degenerative changes are noted throughout the spine status post 2 level cervical fusion. IMPRESSION: Stable chest.  No active cardiopulmonary process. Electronically Signed   By: Richardean Sale M.D.   On: 12/05/2017 14:42   Ct Angio Chest Pe W And/or Wo Contrast  Result Date: 12/05/2017 CLINICAL DATA:  Acute onset severe chest pain, shortness of breath and nausea. History of stage III ovarian cancer, asthma, bronchitis. EXAM: CT ANGIOGRAPHY CHEST WITH CONTRAST TECHNIQUE: Multidetector CT imaging of the chest was performed using the standard protocol during bolus administration of intravenous contrast.  Multiplanar CT image reconstructions and MIPs were obtained to evaluate the vascular anatomy. CONTRAST:  48mL OMNIPAQUE IOHEXOL 350 MG/ML SOLN COMPARISON:  Chest radiograph December 05, 2017 and CT chest June 19, 2017 FINDINGS: CARDIOVASCULAR: Adequate contrast opacification of the pulmonary artery's. Main pulmonary artery is not enlarged. No pulmonary arterial filling defects to the level of the subsegmental branches. Heart size is normal, no right heart strain. No pericardial effusion. Stable 3.7 cm ascending aorta. Mild calcific atherosclerosis. MEDIASTINUM/NODES: New mild bilateral hilar lymphadenopathy. New small mediastinal lymph nodes including 7 mm aortopulmonary window lymph node. RIGHT chest Port-A-Cath. LUNGS/PLEURA: Tracheobronchial tree is patent, no pneumothorax. New hazy ground-glass opacities most conspicuous within upper lobes. RIGHT upper lobe pneumatocele. No pleural effusion. Stable appearance of 5 mm RIGHT middle lobe subsolid subpleural pulmonary nodule. UPPER ABDOMEN: Non-acute.  Status post cholecystectomy. MUSCULOSKELETAL: Non-acute. Streak artifact from ACDF. Multi level moderate thoracic spondylosis. Review of the MIP images confirms the above findings. IMPRESSION: 1. No acute pulmonary embolism. 2. New ground-glass opacities may be infectious or inflammatory. 3. Recommend annual imaging followup by CTA or MRA. This recommendation follows 2010 ACCF/AHA/AATS/ACR/ASA/SCA/SCAI/SIR/STS/SVM Guidelines for the Diagnosis and Management of Patients with Thoracic Aortic Disease. Circulation.2010; 121: N829-F621 Aortic Atherosclerosis (ICD10-I70.0). Electronically Signed   By: Elon Alas M.D.   On: 12/05/2017 16:54    ASSESSMENT: Stage IIIc high-grade serous ovarian carcinoma  PLAN:  1. Stage IIIc high-grade serous ovarian carcinoma:  She underwent debulking surgery at [facility] on November 19, 2016. She was also initiated  on a clinical trial and has received preoperative  infusion of Keytruda on November 11, 2016.  Because of patient's persistent peripheral neuropathy as well as pancytopenia, Taxol was discontinued on April 28, 2017.  CT scan from June 19, 2017 is essentially unchanged from previous scan in January.  MUGA scan on November 05, 2017 revealed an EF of 72.7%.  Patient CA-125 has trended down slightly and is 105.  Because of her declining performance status, will hold treatment for today, but patient will receive IV fluids.  Return to clinic in 5 weeks for repeat laboratory work, further evaluation, and consideration of reinitiation of treatment.  Will continue to get laboratory work 1 to 2 days prior to her treatment.   2. Pain: Worse with hand and foot pain.  Continue fentanyl patch and oxycodone. Previously discussed with patient about referring back to PCP for pain management at conclusion of therapy.   Hold therapy as above. 3. Anxiety: Chronic and unchanged.  Continue Xanax as prescribed. 4.  Peripheral neuropathy: Continue gabapentin 600 mg 3 times per day. Will consider neurology referral in the future.   5.  Anemia: Hemoglobin 11.3 today.  Monitor. 6.  Mouth sores: Continue maintenance Valtrex and Magic mouthwash as needed. 7.  Hand/foot pain: Chronic.  Likely secondary to Doxil.  Hold treatment as above. 8.  Pneumonia: Continue Levaquin as prescribed.  9.  Decreased performance status: Patient will receive IV fluids today. 10.  Hyponatremia: IV fluids as above.  Patient expressed understanding and was in agreement with this plan. She also understands that She can call clinic at any time with any questions, concerns, or complaints.   Cancer Staging Ovarian cancer, unspecified laterality ([place]) Staging form: Ovary, Fallopian Tube, and Primary Peritoneal Carcinoma, AJCC 8th Edition - Clinical: No stage assigned - Unsigned   Lloyd Huger, MD   12/11/2017 9:27 AM

## 2017-12-08 ENCOUNTER — Inpatient Hospital Stay (HOSPITAL_BASED_OUTPATIENT_CLINIC_OR_DEPARTMENT_OTHER): Payer: PPO | Admitting: Oncology

## 2017-12-08 ENCOUNTER — Other Ambulatory Visit: Payer: Self-pay

## 2017-12-08 ENCOUNTER — Inpatient Hospital Stay: Payer: PPO

## 2017-12-08 VITALS — BP 107/70 | HR 109 | Temp 98.1°F | Resp 18 | Wt 147.8 lb

## 2017-12-08 DIAGNOSIS — M9901 Segmental and somatic dysfunction of cervical region: Secondary | ICD-10-CM | POA: Diagnosis not present

## 2017-12-08 DIAGNOSIS — E860 Dehydration: Secondary | ICD-10-CM

## 2017-12-08 DIAGNOSIS — G8929 Other chronic pain: Secondary | ICD-10-CM | POA: Diagnosis not present

## 2017-12-08 DIAGNOSIS — E871 Hypo-osmolality and hyponatremia: Secondary | ICD-10-CM | POA: Diagnosis not present

## 2017-12-08 DIAGNOSIS — C569 Malignant neoplasm of unspecified ovary: Secondary | ICD-10-CM

## 2017-12-08 DIAGNOSIS — F419 Anxiety disorder, unspecified: Secondary | ICD-10-CM

## 2017-12-08 DIAGNOSIS — J189 Pneumonia, unspecified organism: Secondary | ICD-10-CM | POA: Diagnosis not present

## 2017-12-08 DIAGNOSIS — Z87891 Personal history of nicotine dependence: Secondary | ICD-10-CM | POA: Diagnosis not present

## 2017-12-08 DIAGNOSIS — Z5112 Encounter for antineoplastic immunotherapy: Secondary | ICD-10-CM | POA: Diagnosis not present

## 2017-12-08 DIAGNOSIS — M542 Cervicalgia: Secondary | ICD-10-CM | POA: Diagnosis not present

## 2017-12-08 DIAGNOSIS — G44219 Episodic tension-type headache, not intractable: Secondary | ICD-10-CM | POA: Diagnosis not present

## 2017-12-08 LAB — CBC WITH DIFFERENTIAL/PLATELET
BASOS ABS: 0 10*3/uL (ref 0–0.1)
BASOS PCT: 1 %
EOS ABS: 0.1 10*3/uL (ref 0–0.7)
Eosinophils Relative: 3 %
HEMATOCRIT: 31.8 % — AB (ref 35.0–47.0)
HEMOGLOBIN: 11.3 g/dL — AB (ref 12.0–16.0)
Lymphocytes Relative: 26 %
Lymphs Abs: 0.9 10*3/uL — ABNORMAL LOW (ref 1.0–3.6)
MCH: 34.6 pg — ABNORMAL HIGH (ref 26.0–34.0)
MCHC: 35.6 g/dL (ref 32.0–36.0)
MCV: 97.3 fL (ref 80.0–100.0)
MONO ABS: 0.6 10*3/uL (ref 0.2–0.9)
MONOS PCT: 17 %
NEUTROS ABS: 2 10*3/uL (ref 1.4–6.5)
NEUTROS PCT: 53 %
Platelets: 218 10*3/uL (ref 150–440)
RBC: 3.26 MIL/uL — ABNORMAL LOW (ref 3.80–5.20)
RDW: 14.4 % (ref 11.5–14.5)
WBC: 3.6 10*3/uL (ref 3.6–11.0)

## 2017-12-08 LAB — COMPREHENSIVE METABOLIC PANEL
ALBUMIN: 3.3 g/dL — AB (ref 3.5–5.0)
ALT: 31 U/L (ref 0–44)
AST: 28 U/L (ref 15–41)
Alkaline Phosphatase: 76 U/L (ref 38–126)
Anion gap: 13 (ref 5–15)
BUN: 14 mg/dL (ref 8–23)
CHLORIDE: 89 mmol/L — AB (ref 98–111)
CO2: 27 mmol/L (ref 22–32)
CREATININE: 1.13 mg/dL — AB (ref 0.44–1.00)
Calcium: 9 mg/dL (ref 8.9–10.3)
GFR calc Af Amer: 59 mL/min — ABNORMAL LOW (ref >60)
GFR calc non Af Amer: 51 mL/min — ABNORMAL LOW (ref >60)
Glucose, Bld: 136 mg/dL — ABNORMAL HIGH (ref 70–99)
POTASSIUM: 3.9 mmol/L (ref 3.5–5.1)
SODIUM: 129 mmol/L — AB (ref 135–145)
TOTAL PROTEIN: 7 g/dL (ref 6.5–8.1)
Total Bilirubin: 0.6 mg/dL (ref 0.3–1.2)

## 2017-12-08 MED ORDER — SODIUM CHLORIDE 0.9 % IV SOLN
Freq: Once | INTRAVENOUS | Status: AC
  Administered 2017-12-08: 11:00:00 via INTRAVENOUS
  Filled 2017-12-08: qty 250

## 2017-12-08 MED ORDER — DEXAMETHASONE SODIUM PHOSPHATE 10 MG/ML IJ SOLN
10.0000 mg | Freq: Once | INTRAMUSCULAR | Status: AC
  Administered 2017-12-08: 10 mg via INTRAVENOUS
  Filled 2017-12-08: qty 1

## 2017-12-08 MED ORDER — SODIUM CHLORIDE 0.9 % IV SOLN: 10.0000 mg | Freq: Once | INTRAVENOUS | Status: DC

## 2017-12-08 MED ORDER — HEPARIN SOD (PORK) LOCK FLUSH 100 UNIT/ML IV SOLN
500.0000 [IU] | Freq: Once | INTRAVENOUS | Status: AC
  Administered 2017-12-08: 500 [IU] via INTRAVENOUS
  Filled 2017-12-08: qty 5

## 2017-12-08 MED ORDER — SODIUM CHLORIDE 0.9% FLUSH
10.0000 mL | INTRAVENOUS | Status: DC | PRN
  Administered 2017-12-08: 10 mL via INTRAVENOUS
  Filled 2017-12-08: qty 10

## 2017-12-08 NOTE — Progress Notes (Signed)
Pt here for follow up. Dx in ER  W pneumonia -on Levaquin 500 mg x 7 days. Per pt noticed reddened area on great toe area left foot  -swollen per pt -and is throbbing pain. Second toe " bruised " appearance-also very painful she stated. Skin is also peeling in this area she stated.

## 2017-12-09 ENCOUNTER — Ambulatory Visit: Payer: Self-pay | Admitting: Pharmacist

## 2017-12-09 DIAGNOSIS — F329 Major depressive disorder, single episode, unspecified: Secondary | ICD-10-CM

## 2017-12-09 DIAGNOSIS — G43009 Migraine without aura, not intractable, without status migrainosus: Secondary | ICD-10-CM

## 2017-12-09 LAB — CA 125: CANCER ANTIGEN (CA) 125: 105 U/mL — AB (ref 0.0–38.1)

## 2017-12-09 NOTE — Chronic Care Management (AMB) (Signed)
  Chronic Care Management   Note  12/09/2017 Name: Jessica Burgess MRN: [id number] DOB: 07-01-1955   Jessica Burgess is a 62 y.o. year old female who sees Steele Sizer, MD for primary care. Referral from [facility] Risk Stratification list. Chronic diseases include: ovarian cancer, migraine, depression, pain. Telephone outreach to patient today to introduce CCM services.   Jessica Burgess agreed that CCM services would be helpful to them.   Plan: I have scheduled a call to [name] tomorrow regarding: dental referral to Dr. Sharlett Iles (tooth pain/abcess) and [employer] medication assistance program.    Jessica Burgess was given information about Chronic Care Management services today including:  1. CCM service includes personalized support from designated clinical staff supervised by her physician, including individualized plan of care and coordination with other care providers 2. 24/7 contact phone numbers for assistance for urgent and routine care needs. 3. Service will only be billed when office clinical staff spend 20 minutes or more in a month to coordinate care. 4. Only one practitioner may furnish and bill the service in a calendar month. 5. The patient may stop CCM services at any time (effective at the end of the month) by phone call to the office staff. 6. The patient will be responsible for cost sharing (co-pay) of up to 20% of the service fee (after annual deductible is met).   Patient agreed to services and verbal consent obtained.  Ruben Reason, PharmD Clinical Pharmacist [facility] [phone]

## 2017-12-11 ENCOUNTER — Ambulatory Visit: Payer: Self-pay | Admitting: Pharmacist

## 2017-12-11 DIAGNOSIS — G43009 Migraine without aura, not intractable, without status migrainosus: Secondary | ICD-10-CM

## 2017-12-11 DIAGNOSIS — F329 Major depressive disorder, single episode, unspecified: Secondary | ICD-10-CM

## 2017-12-12 NOTE — Chronic Care Management (AMB) (Signed)
  Chronic Care Management   Note  12/12/2017 Name: Jessica Burgess MRN: [id number] DOB: 11-16-1955    Chronic Care Management   Follow Up Note   12/12/2017 Name: Jessica Burgess MRN: [id number] DOB: 09/01/1955  Referred by: Steele Sizer, MD Reason for referral : Chronic Care Management (Dental referral, [employer] MAP)   Subjective: 62 year old female referred to CCM via risk stratification lists. Patient has expressed acute needs of dental discomfort and copay assistance for Em[employer]ality. CCM team will address these acute needs.   Assessment:    Goals Addressed            This Visit's Progress   . "I want to make sure I can afford Emgality" (pt-stated)       Clinical Goals: Minimize patient's chemotherapy side effects, including migraines and dental discomfort  Interventions: Apply to [employer] for [employer], refer to dentist      Medication Assistance: Reason Why Patient is Unable to Afford Medications: Copay too expensive  Medication Assistance Plan: Medication Assistance Plan: Apply for manufacturer assistance for [employer] through [employer]. Patient has spent over $1500 out of pocket on prescriptions this year [employer] requires $1100). Counseled patient on the requirements of the application, including proof of income. TROOP statement can be obtained by CCM pharmacist via [facility].   Dental referral: CCM pharmacist called [facility] ([facility]) and set patient up an emergency visit for possible dental abscess.    Total time counseling 10 minutes.    Follow up: Patient will drop off proof of income paperwork for [employer] application in 4-10 business days.  Follow up will depend on MAP approval process.   Ruben Reason, PharmD Clinical Pharmacist [facility] [phone]

## 2017-12-16 DIAGNOSIS — M9903 Segmental and somatic dysfunction of lumbar region: Secondary | ICD-10-CM | POA: Diagnosis not present

## 2017-12-16 DIAGNOSIS — M9901 Segmental and somatic dysfunction of cervical region: Secondary | ICD-10-CM | POA: Diagnosis not present

## 2017-12-16 DIAGNOSIS — M5127 Other intervertebral disc displacement, lumbosacral region: Secondary | ICD-10-CM | POA: Diagnosis not present

## 2017-12-16 DIAGNOSIS — M542 Cervicalgia: Secondary | ICD-10-CM | POA: Diagnosis not present

## 2018-01-01 ENCOUNTER — Other Ambulatory Visit: Payer: Self-pay | Admitting: Oncology

## 2018-01-01 ENCOUNTER — Other Ambulatory Visit: Payer: Self-pay | Admitting: *Deleted

## 2018-01-01 DIAGNOSIS — C569 Malignant neoplasm of unspecified ovary: Secondary | ICD-10-CM

## 2018-01-01 DIAGNOSIS — G8929 Other chronic pain: Secondary | ICD-10-CM

## 2018-01-01 DIAGNOSIS — M5441 Lumbago with sciatica, right side: Principal | ICD-10-CM

## 2018-01-01 DIAGNOSIS — M542 Cervicalgia: Secondary | ICD-10-CM

## 2018-01-01 MED ORDER — OXYCODONE-ACETAMINOPHEN 10-325 MG PO TABS: 1.0000 | ORAL_TABLET | Freq: Three times a day (TID) | ORAL | 0 refills | Status: DC | PRN

## 2018-01-01 NOTE — Progress Notes (Signed)
Patient called cancer center requesting refill of Percocet 10 mg/325 mg q 8 hours PRN for pain.   [place] Controlled Substance Reporting System reviewed and refill is appropriate on or after 12/22/17. Medication e-scribed to her pharmacy [facility])  using [employer]'s 2-step verification process.    [place] reviewed:     Faythe Casa, NP 01/01/2018 10:33 AM [phone]

## 2018-01-02 ENCOUNTER — Other Ambulatory Visit: Payer: Self-pay | Admitting: Family Medicine

## 2018-01-07 ENCOUNTER — Inpatient Hospital Stay: Payer: PPO | Attending: Oncology

## 2018-01-07 DIAGNOSIS — C569 Malignant neoplasm of unspecified ovary: Secondary | ICD-10-CM | POA: Insufficient documentation

## 2018-01-07 DIAGNOSIS — G629 Polyneuropathy, unspecified: Secondary | ICD-10-CM | POA: Insufficient documentation

## 2018-01-07 DIAGNOSIS — Z87891 Personal history of nicotine dependence: Secondary | ICD-10-CM | POA: Insufficient documentation

## 2018-01-07 DIAGNOSIS — Z79899 Other long term (current) drug therapy: Secondary | ICD-10-CM | POA: Diagnosis not present

## 2018-01-07 DIAGNOSIS — F419 Anxiety disorder, unspecified: Secondary | ICD-10-CM | POA: Insufficient documentation

## 2018-01-07 DIAGNOSIS — Z90710 Acquired absence of both cervix and uterus: Secondary | ICD-10-CM | POA: Insufficient documentation

## 2018-01-07 LAB — COMPREHENSIVE METABOLIC PANEL
ALT: 20 U/L (ref 0–44)
AST: 25 U/L (ref 15–41)
Albumin: 4 g/dL (ref 3.5–5.0)
Alkaline Phosphatase: 64 U/L (ref 38–126)
Anion gap: 6 (ref 5–15)
BUN: 17 mg/dL (ref 8–23)
CHLORIDE: 102 mmol/L (ref 98–111)
CO2: 30 mmol/L (ref 22–32)
CREATININE: 0.85 mg/dL (ref 0.44–1.00)
Calcium: 9.8 mg/dL (ref 8.9–10.3)
GFR calc Af Amer: >60 mL/min (ref >60)
GFR calc non Af Amer: >60 mL/min (ref >60)
Glucose, Bld: 85 mg/dL (ref 70–99)
POTASSIUM: 3.7 mmol/L (ref 3.5–5.1)
SODIUM: 138 mmol/L (ref 135–145)
Total Bilirubin: 0.5 mg/dL (ref 0.3–1.2)
Total Protein: 7.2 g/dL (ref 6.5–8.1)

## 2018-01-07 LAB — CBC WITH DIFFERENTIAL/PLATELET
ABS IMMATURE GRANULOCYTES: 0.02 10*3/uL (ref 0.00–0.07)
BASOS ABS: 0.1 10*3/uL (ref 0.0–0.1)
BASOS PCT: 1 %
Eosinophils Absolute: 0.3 10*3/uL (ref 0.0–0.5)
Eosinophils Relative: 6 %
HCT: 37 % (ref 36.0–46.0)
Hemoglobin: 12.3 g/dL (ref 12.0–15.0)
IMMATURE GRANULOCYTES: 0 %
Lymphocytes Relative: 37 %
Lymphs Abs: 2.1 10*3/uL (ref 0.7–4.0)
MCH: 32.5 pg (ref 26.0–34.0)
MCHC: 33.2 g/dL (ref 30.0–36.0)
MCV: 97.9 fL (ref 80.0–100.0)
Monocytes Absolute: 0.5 10*3/uL (ref 0.1–1.0)
Monocytes Relative: 10 %
NEUTROS ABS: 2.7 10*3/uL (ref 1.7–7.7)
NEUTROS PCT: 46 %
NRBC: 0 % (ref 0.0–0.2)
PLATELETS: 209 10*3/uL (ref 150–400)
RBC: 3.78 MIL/uL — AB (ref 3.87–5.11)
RDW: 13 % (ref 11.5–15.5)
WBC: 5.7 10*3/uL (ref 4.0–10.5)

## 2018-01-07 LAB — PROTEIN, URINE, RANDOM: TOTAL PROTEIN, URINE: 8 mg/dL

## 2018-01-08 LAB — CA 125: Cancer Antigen (CA) 125: 129 U/mL — ABNORMAL HIGH (ref 0.0–38.1)

## 2018-01-09 ENCOUNTER — Other Ambulatory Visit: Payer: PPO

## 2018-01-11 NOTE — Progress Notes (Signed)
[name]  [phone] [phone]  ID: Jessica Burgess OB: 01/09/1956  MR#: [id number]  AOZ#:[id number]  Patient Care Team: Steele Sizer, MD as PCP - General (Family Medicine) Lloyd Huger, MD as Consulting Physician (Oncology) Mellody Drown, MD as Consulting Physician (Obstetrics and Gynecology) Cathi Roan, [facility] (Pharmacist) Clerance Lav, RN as Case Manager  CHIEF COMPLAINT: Stage IIIc high-grade serous ovarian carcinoma  INTERVAL HISTORY: Patient returns to clinic today for repeat laboratory work, further evaluation, and treatment planning.  She feels significantly improved since discontinuing treatment and nearly back to her baseline.  Her peripheral neuropathy is unchanged.  She has no other neurologic complaints.  The erythema, pain, and peeling of the skin on her hands and feet have resolved.  She denies any recent fevers or illnesses.  She has a good appetite and her weight has remained stable.  She has no chest pain or shortness of breath. She denies any nausea, vomiting, constipation, or diarrhea.  She denies any abdominal pain or bloating.  She has no urinary complaints.  Patient offers no specific complaints today.  REVIEW OF SYSTEMS:   Review of Systems  Constitutional: Negative.  Negative for fever, malaise/fatigue and weight loss.  HENT: Negative.  Negative for sore throat.   Respiratory: Negative.  Negative for cough and shortness of breath.   Cardiovascular: Negative.  Negative for chest pain and leg swelling.  Gastrointestinal: Negative.  Negative for abdominal pain, blood in stool, constipation, diarrhea, melena, nausea and vomiting.  Genitourinary: Negative.  Negative for dysuria.  Musculoskeletal: Negative.  Negative for back pain, joint pain and neck pain.  Skin: Negative.  Negative for itching and rash.  Neurological: Positive for tingling and sensory change. Negative for focal weakness and weakness.    Psychiatric/Behavioral: Negative.  The patient is not nervous/anxious.     As per HPI. Otherwise, a complete review of systems is negative.  PAST MEDICAL HISTORY: Past Medical History:  Diagnosis Date  . Allergic rhinitis, cause unspecified   . Anxiety state, unspecified   . Arthritis   . Asthma    only when sick   . Backache, unspecified   . Bronchitis    hx of when get sick  . Cancer ([place])    skin cancer , basal cell   . Cancer ([place]) 11/2016   ovarian  . Cervicalgia   . Complication of anesthesia   . Dermatophytosis of nail   . Dysmetabolic syndrome X   . Encounter for long-term (current) use of other medications   . Esophageal reflux   . Insomnia, unspecified   . Leukocytosis, unspecified   . Migraine without aura, without mention of intractable migraine without mention of status migrainosus   . Other and unspecified hyperlipidemia   . Other malaise and fatigue   . Overweight(278.02)   . Personal history of chemotherapy now   ovarian  . PONV (postoperative nausea and vomiting)   . Spinal stenosis in cervical region   . Symptomatic menopausal or female climacteric states   . Unspecified disorder of skin and subcutaneous tissue   . Unspecified vitamin D deficiency     PAST SURGICAL HISTORY: Past Surgical History:  Procedure Laterality Date  . ABDOMINAL HYSTERECTOMY    . ANTERIOR CERVICAL DECOMP/DISCECTOMY FUSION N/A 06/07/2015   Procedure: Cervical three - four and Cervical six- seven anterior cervical decompression with fusion interbody prosthesis plating and bonegraft;  Surgeon: Newman Pies, MD;  Location: [facility];  Service: Neurosurgery;  Laterality:  N/A;  C34 and C67 anterior cervical decompression with fusion interbody prosthesis plating and bonegraft  . BACK SURGERY     x2 Lower   . EVACUATION OF CERVICAL HEMATOMA N/A 06/14/2015   Procedure: EVACUATION OF CERVICAL HEMATOMA;  Surgeon: Newman Pies, MD;  Location: [facility];  Service:  Neurosurgery;  Laterality: N/A;  . NECK SURGERY     x3  . TONSILLECTOMY      FAMILY HISTORY: Family History  Problem Relation Age of Onset  . Depression Mother   . Migraines Mother   . Dementia Father   . Diabetes Father   . Hyperlipidemia Father   . Hyperlipidemia Brother   . Hyperlipidemia Brother   . Breast cancer Paternal Aunt        30s    ADVANCED DIRECTIVES (Y/N):  N  HEALTH MAINTENANCE: Social History   Tobacco Use  . Smoking status: Former Smoker    Packs/day: 1.50    Years: 20.00    Pack years: 30.00    Types: Cigarettes    Start date: 03/19/1979    Last attempt to quit: 08/25/1999    Years since quitting: 18.4  . Smokeless tobacco: Never Used  . Tobacco comment: smoking cessation materials not required  Substance Use Topics  . Alcohol use: Not Currently    Alcohol/week: 0.0 standard drinks  . Drug use: No     Colonoscopy:  PAP:  Bone density:  Lipid panel:  No Known Allergies  Current Outpatient Medications  Medication Sig Dispense Refill  . Ascorbic Acid (VITAMIN C PO) Take 1 tablet by mouth daily.     . Cholecalciferol (VITAMIN D-1000 MAX ST) 1000 UNITS tablet Take 1 tablet by mouth 2 (two) times daily.    . Cyanocobalamin (B-12 PO) Take 1 tablet by mouth daily.     . cyclobenzaprine (FLEXERIL) 10 MG tablet TAKE 1 TABLET BY MOUTH THREE TIMES A DAY IF NEEDED FOR MUSCLE SPASM 30 tablet 0  . DULoxetine (CYMBALTA) 60 MG capsule TAKE 1 CAPSULE(60 MG) BY MOUTH DAILY 90 capsule 0  . estradiol (ESTRACE) 0.5 MG tablet Take 1 tablet (0.5 mg total) by mouth daily. 90 tablet 4  . ferrous sulfate (IRON SUPPLEMENT) 325 (65 FE) MG tablet Take 1 tablet by mouth daily.    . fluticasone (FLONASE) 50 MCG/ACT nasal spray Place 2 sprays into both nostrils as needed. 16 g 5  . gabapentin (NEURONTIN) 300 MG capsule Take 1-3 capsules (300-900 mg total) by mouth 3 (three) times daily. 300 mg twice a day and 900 mg at night (Patient taking differently: Take 900 mg by mouth 2  (two) times daily. 300 mg twice a day and 900 mg at night) 540 capsule 1  . Galcanezumab-gnlm (EMGALITY) 120 MG/ML SOSY Inject 120 mg into the skin every 30 (thirty) days. 1 mL 12  . Lysine 1000 MG TABS Take 1 tablet by mouth daily.     . Magnesium Oxide 500 MG CAPS Take 500 mg by mouth 2 (two) times daily.     . Omega-3 Fatty Acids (FISH OIL PO) Take 1 capsule by mouth 2 (two) times daily.     Marland Kitchen oxyCODONE-acetaminophen (PERCOCET) 10-325 MG tablet Take 1 tablet by mouth every 8 (eight) hours as needed for pain. 90 tablet 0  . potassium gluconate 595 MG TABS tablet Take 595 mg by mouth daily.    . temazepam (RESTORIL) 15 MG capsule Take 1 capsule (15 mg total) by mouth at bedtime as needed for sleep.  30 capsule 2  . albuterol (PROVENTIL HFA;VENTOLIN HFA) 108 (90 Base) MCG/ACT inhaler Inhale 2 puffs into the lungs every 6 (six) hours as needed for wheezing or shortness of breath. (Patient not taking: Reported on 12/08/2017) 1 Inhaler 2  . albuterol (PROVENTIL) (2.5 MG/3ML) 0.083% nebulizer solution Take 3 mLs (2.5 mg total) by nebulization every 6 (six) hours as needed for wheezing or shortness of breath. (Patient not taking: Reported on 12/08/2017) 75 mL 2  . budesonide-formoterol (SYMBICORT) 160-4.5 MCG/ACT inhaler Inhale 2 puffs into the lungs 2 (two) times daily.    . fentaNYL (DURAGESIC - DOSED MCG/HR) 50 MCG/HR Place 1 patch (50 mcg total) onto the skin every 3 (three) days. (Patient not taking: Reported on 01/12/2018) 10 patch 0  . ipratropium (ATROVENT HFA) 17 MCG/ACT inhaler Inhale 2 puffs into the lungs every 6 (six) hours as needed.     . lidocaine (XYLOCAINE) 2 % solution Use as directed 15 mLs in the mouth or throat as needed for mouth pain. (Patient not taking: Reported on 01/12/2018) 100 mL 0  . magic mouthwash w/lidocaine SOLN Take 5 mLs by mouth 3 (three) times daily. (Patient not taking: Reported on 01/12/2018) 315 mL 0  . Multiple Vitamins-Minerals (MULTIVITAMIN PO) Take 1 tablet by  mouth daily.     . SUMAtriptan (IMITREX) 100 MG tablet May repeat in 2 hours if headache persists or recurs. (Patient not taking: Reported on 12/08/2017) 27 tablet 0  . triamcinolone (KENALOG) 0.1 % paste Use as directed 1 application in the mouth or throat 2 (two) times daily. (Patient not taking: Reported on 01/12/2018) 5 g 2  . urea (CARMOL) 10 % cream Apply topically as needed. (Patient not taking: Reported on 01/12/2018) 410 g 1  . valACYclovir (VALTREX) 1000 MG tablet TAKE 1 TABLET(1000 MG) BY MOUTH THREE TIMES DAILY FOR 7 DAYS (Patient not taking: Reported on 01/12/2018) 21 tablet 0   No current facility-administered medications for this visit.    Facility-Administered Medications Ordered in Other Visits  Medication Dose Route Frequency Provider Last Rate Last Dose  . 0.9 %  sodium chloride infusion   Intravenous Once Lloyd Huger, MD      . 0.9 %  sodium chloride infusion   Intravenous Once Lloyd Huger, MD      . dexamethasone (DECADRON) 20 mg in sodium chloride 0.9 % 50 mL IVPB  20 mg Intravenous Once Lloyd Huger, MD      . dexamethasone (DECADRON) injection 10 mg  10 mg Intravenous Once Lloyd Huger, MD      . sodium chloride flush (NS) 0.9 % injection 10 mL  10 mL Intravenous PRN Lloyd Huger, MD   10 mL at 06/16/17 0854    OBJECTIVE: Vitals:   01/12/18 1100  BP: (!) 143/93  Pulse: 86  Resp: 18  Temp: (!) 97.5 F (36.4 C)     Body mass index is 27.45 kg/m.    ECOG FS:0 - Asymptomatic  General: Well-developed, well-nourished, no acute distress. Eyes: Pink conjunctiva, anicteric sclera. HEENT: Normocephalic, moist mucous membranes, clear oropharnyx. Lungs: Clear to auscultation bilaterally. Heart: Regular rate and rhythm. No rubs, murmurs, or gallops. Abdomen: Soft, nontender, nondistended. No organomegaly noted, normoactive bowel sounds. Musculoskeletal: No edema, cyanosis, or clubbing. Neuro: Alert, answering all questions  appropriately. Cranial nerves grossly intact. Skin: No rashes or petechiae noted. Psych: Normal affect.  LAB RESULTS:  Lab Results  Component Value Date   NA 138 01/07/2018   K  3.7 01/07/2018   CL 102 01/07/2018   CO2 30 01/07/2018   GLUCOSE 85 01/07/2018   BUN 17 01/07/2018   CREATININE 0.85 01/07/2018   CALCIUM 9.8 01/07/2018   PROT 7.2 01/07/2018   ALBUMIN 4.0 01/07/2018   AST 25 01/07/2018   ALT 20 01/07/2018   ALKPHOS 64 01/07/2018   BILITOT 0.5 01/07/2018   GFRNONAA >60 01/07/2018   GFRAA >60 01/07/2018    Lab Results  Component Value Date   WBC 5.7 01/07/2018   NEUTROABS 2.7 01/07/2018   HGB 12.3 01/07/2018   HCT 37.0 01/07/2018   MCV 97.9 01/07/2018   PLT 209 01/07/2018     STUDIES: No results found.  ONCOLOGY HISTORY: Patient underwent debulking surgery at [facility] on November 19, 2016. She received one infusion preoperative infusion of Keytruda on November 11, 2016 as part of a clinical trial.  She initiated adjuvant carboplatinum and Taxol on December 16, 2016.  This was continued through April 28, 2017 at which time Taxol was discontinued secondary to worsening neuropathy.  Patient then received single agent carboplatinum for additional 2 infusions.  She was then noted to have progression of disease and was switched to Avastin and doxorubicin on August 18, 2017 this was discontinued on November 24, 2017 secondary to declining performance status.     ASSESSMENT: Stage IIIc high-grade serous ovarian carcinoma  PLAN:  1. Stage IIIc high-grade serous ovarian carcinoma: See oncology history as above.  Patient's performance status has significantly improved and she is nearly back to her baseline.  Her CA-125 has trended up to 129, but patient remains asymptomatic. MUGA scan on November 05, 2017 revealed an EF of 72.7%. She does not wish to reinitiate treatment at this time.  Patient is going on a trip to [place] and does not want to start any treatment  until she returns.  Will reimage with CT scan week of February 02, 2018 with follow-up several days later to assess for anti-interval change and determine whether reinitiation of treatment is necessary.   2. Pain: Nearly resolved. Continue fentanyl patch and oxycodone. Previously discussed with patient about referring back to PCP for pain management at conclusion of therapy. 3. Anxiety: Chronic and unchanged.  Continue Xanax as prescribed. 4.  Peripheral neuropathy: Continue gabapentin 600 mg 3 times per day. Will consider neurology referral in the future.   5.  Anemia: Resolved.   6.  Mouth sores: Resolved.  Continue maintenance Valtrex and Magic mouthwash as needed. 7.  Hand/foot pain: Secondary to Doxil.  Resolved. 8.  Hyponatremia: Patient's sodium is now within normal limits.  Patient expressed understanding and was in agreement with this plan. She also understands that She can call clinic at any time with any questions, concerns, or complaints.   Cancer Staging Ovarian cancer, unspecified laterality ([place]) Staging form: Ovary, Fallopian Tube, and Primary Peritoneal Carcinoma, AJCC 8th Edition - Clinical: No stage assigned - Unsigned   Lloyd Huger, MD   01/14/2018 6:23 AM

## 2018-01-12 ENCOUNTER — Encounter: Payer: Self-pay | Admitting: Oncology

## 2018-01-12 ENCOUNTER — Inpatient Hospital Stay: Payer: PPO

## 2018-01-12 ENCOUNTER — Inpatient Hospital Stay (HOSPITAL_BASED_OUTPATIENT_CLINIC_OR_DEPARTMENT_OTHER): Payer: PPO | Admitting: Oncology

## 2018-01-12 VITALS — BP 143/93 | HR 86 | Temp 97.5°F | Resp 18 | Wt 150.1 lb

## 2018-01-12 DIAGNOSIS — C569 Malignant neoplasm of unspecified ovary: Secondary | ICD-10-CM

## 2018-01-12 DIAGNOSIS — F419 Anxiety disorder, unspecified: Secondary | ICD-10-CM | POA: Diagnosis not present

## 2018-01-12 DIAGNOSIS — Z87891 Personal history of nicotine dependence: Secondary | ICD-10-CM

## 2018-01-12 DIAGNOSIS — Z90710 Acquired absence of both cervix and uterus: Secondary | ICD-10-CM

## 2018-01-12 DIAGNOSIS — G629 Polyneuropathy, unspecified: Secondary | ICD-10-CM

## 2018-01-12 DIAGNOSIS — Z79899 Other long term (current) drug therapy: Secondary | ICD-10-CM | POA: Diagnosis not present

## 2018-01-12 NOTE — Progress Notes (Signed)
Pt in for follow up today, no concerns or difficulties expressed today.

## 2018-01-14 ENCOUNTER — Other Ambulatory Visit: Payer: Self-pay | Admitting: Oncology

## 2018-01-28 ENCOUNTER — Inpatient Hospital Stay: Payer: PPO | Attending: Oncology | Admitting: Obstetrics and Gynecology

## 2018-01-28 VITALS — BP 144/83 | HR 83 | Temp 97.2°F | Resp 18 | Ht 62.0 in | Wt 153.6 lb

## 2018-01-28 DIAGNOSIS — Z9221 Personal history of antineoplastic chemotherapy: Secondary | ICD-10-CM

## 2018-01-28 DIAGNOSIS — C569 Malignant neoplasm of unspecified ovary: Secondary | ICD-10-CM | POA: Diagnosis not present

## 2018-01-28 DIAGNOSIS — Z87891 Personal history of nicotine dependence: Secondary | ICD-10-CM

## 2018-01-28 DIAGNOSIS — Z90722 Acquired absence of ovaries, bilateral: Secondary | ICD-10-CM

## 2018-01-28 DIAGNOSIS — Z90710 Acquired absence of both cervix and uterus: Secondary | ICD-10-CM

## 2018-01-28 DIAGNOSIS — R971 Elevated cancer antigen 125 [CA 125]: Secondary | ICD-10-CM | POA: Insufficient documentation

## 2018-01-28 DIAGNOSIS — G629 Polyneuropathy, unspecified: Secondary | ICD-10-CM | POA: Insufficient documentation

## 2018-01-28 DIAGNOSIS — C786 Secondary malignant neoplasm of retroperitoneum and peritoneum: Secondary | ICD-10-CM | POA: Diagnosis not present

## 2018-01-28 NOTE — Progress Notes (Signed)
Chronic back pain,  Has these quick pains that is sharp and intense that starts right above pelvic bone and then goes straight through her vaginal cavity. It happens several times a week. Has chronic back pain not related to cancer.with her bladder she urinates but it seems to take a while to empty and she has to push to get it empty

## 2018-01-28 NOTE — Progress Notes (Signed)
Gynecologic Oncology Interval Visit   Referring Provider: Dr Grayland Ormond  Chief Concern: stage IIIC ovarian cancer  Subjective:  Jessica Burgess is a 62 y.o. female, initially seen in consultation for Dr. 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   She initiated on a clinical trial of preoperative Keytruda on 11/11/2016 followed by debulking surgery at [facility] on 11/19/2016.  Due to persistent peripheral neuropathy and pancytopenia, Taxol was discontinued on 04/28/2017 following 7 cycles. CT on 06/19/2017 was essentially unchanged from previous. She received 2 cycles of carboplatin and switched to Doxil-Avastin on 08/18/17 and is s/p 4 cycles. Ca 125 has been followed as below.    CA 125 has been essentially stable.   114.0 11/10/17  122.0 11/21/17  105.0 12/08/17  129.0 01/07/18  She has had mucositis, hand/foot, peripheral neuropathy, anorexia, vaginal discharge with retained cervix, and recent ER visit for pneumonia which prompted chemotherapy holiday.  Last chemotherapy on 11/24/2017.  Today, she reports sharp abdominal pains intermittently, and continued vaginal discharge and spotting.    Gynecologic Oncology History:  Jessica Burgess initially presented to Dr. Grayland Ormond on 10/21/16 with several month history of LLQ pain.    10/25/16:   CA-125 of 1,902; CEA of 2.2 (N), repeat CA125 3,257.  10/25/16:  CTAP -- Omental caking, greatest within the pelvis, highly suspicious for metastatic disease. No definite primary tumor identified. Possible tiny peritoneal nodules. Small amount of free pelvic fluid.  11/05/16: Diagnostic laparoscopy with biopsies.  Fagotti score 4. Path revealed metastatic high grade serous carcinoma. Decision was made to proceed with [employer] study and primary debulking surgery.  11/11/16: Pre-surgical dose of Pembrolizumab '200mg'$  IV   11/19/16: Exploratory laparotomy, bilateral salpingo-oopherectomy, omentectomy,  tumor debulking, liver moblization, and argon beam ablation of tumor nodules on the right diaphragm and posterior cul-de-sac.  At the completion of the case, optimal debulking was achieved with all tumor nodules less than 1 cm in diameter.  Final pathology confirmed high grade serous adenocarcinoma involving the omentum, bilateral tubes, pelvic peritoneum, sigmoid nodule.  STIC in the right fallopian tube.   12/01/2016 Post op course complicated by abdominal wall seroma.  CT shows new moderate amount of free fluid within the pelvis. There is no loculated or encapsulated fluid collection. There are multiple small fluid collections in the anterior subcutaneous tissues. Representative areas measure 2.0 x 5.5 cm (series 2, image 71) and 2.2 x 3.4 cm (series 2, image 60).   Wound separation on 12/01/16 in ER_wet to dry dressing_healing via secondary intention.   9/20/2018_mediport placed  She was also initiated on a clinical trial and has received preoperative infusion of Keytruda on November 11, 2016 and then started carbo/taxol.  Because of patient's persistent peripheral neuropathy as well as pancytopenia, Taxol was discontinued on April 28, 2017.  CT scan from June 19, 2017 essentially unchanged from previous scan in January with some residual carcinomatosis and CA125 still elevated. Stopped after 9 cycles in May (last 2 cycles with single agent Carbo).  Then switched to Doxil/Avastin in June 2019.  Genetic Risk Assessment:  Genetic testing with [facility] MyRisk negative.  [employer] MyChoice was negative for BRCA1 or BRCA2 mutations in tumor. Myriad Genomic Instability- negative  Problem List: Patient Active Problem List   Diagnosis Date Noted  . 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 Mucositis due to chemotherapy 09/02/2017  . Goals of care, counseling/discussion 08/08/2017  . Hypothyroidism due to medication 07/28/2017  .  Genetic testing 12/18/2016  . Migraines 12/01/2016  . Postoperative seroma  of subcutaneous tissue after non-dermatologic procedure 12/01/2016  . Transaminitis 12/01/2016  . Anemia associated with acute blood loss 11/20/2016  . Ovarian cancer, unspecified laterality ([place]) 11/12/2016  . 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[place]) 11/05/2016  . Examination of participant in clinical trial 11/01/2016  . Chronic right-sided low back pain with right-sided sciatica 02/02/2016  . History of neck surgery 08/01/2015  . Hematoma 06/14/2015  . Perennial allergic rhinitis 05/05/2015  . Generalized anxiety disorder 02/03/2015  . Back pain, chronic 08/25/2014  . Insomnia, persistent 08/25/2014  . Chronic cervical pain 08/25/2014  . Major depression, chronic ([place]) 08/25/2014  . Dyslipidemia 08/25/2014  . Gastro-esophageal reflux disease without esophagitis 08/25/2014  . H/O high risk medication treatment 08/25/2014  . Blood glucose elevated 08/25/2014  . Migraine without aura and without status migrainosus, not intractable 08/25/2014  . Climacteric 08/25/2014  . Dysmetabolic syndrome 09/81/1914  . Fungal infection of toenail 08/25/2014  . Obesity (BMI 30.0-34.9) 08/25/2014  . Vitamin D deficiency 08/25/2014  . Engages in travel abroad 08/25/2014  . Cervical disc disorder with radiculopathy 04/28/2013   Past Medical History: Past Medical History:  Diagnosis Date  . Allergic rhinitis, cause unspecified   . Anxiety state, unspecified   . Arthritis   . Asthma    only when sick   . Backache, unspecified   . Bronchitis    hx of when get sick  . Cancer ([place])    skin cancer , basal cell   . Cancer ([place]) 11/2016   ovarian  . Cervicalgia   . Complication of anesthesia   . Dermatophytosis of nail   . Dysmetabolic syndrome X   . Encounter for long-term (current) use of other medications   . Esophageal reflux   . Insomnia, unspecified   . Leukocytosis, unspecified   . Migraine without aura, without mention of intractable migraine without mention of status  migrainosus   . Other and unspecified hyperlipidemia   . Other malaise and fatigue   . Overweight(278.02)   . Personal history of chemotherapy now   ovarian  . PONV (postoperative nausea and vomiting)   . Spinal stenosis in cervical region   . Symptomatic menopausal or female climacteric states   . Unspecified disorder of skin and subcutaneous tissue   . Unspecified vitamin D deficiency    Past Surgical History: Past Surgical History:  Procedure Laterality Date  . ABDOMINAL HYSTERECTOMY    . ANTERIOR CERVICAL DECOMP/DISCECTOMY FUSION N/A 06/07/2015   Procedure: Cervical three - four and Cervical six- seven anterior cervical decompression with fusion interbody prosthesis plating and bonegraft;  Surgeon: Newman Pies, MD;  Location: [place] [facility];  Service: Neurosurgery;  Laterality: N/A;  C34 and C67 anterior cervical decompression with fusion interbody prosthesis plating and bonegraft  . BACK SURGERY     x2 Lower   . EVACUATION OF CERVICAL HEMATOMA N/A 06/14/2015   Procedure: EVACUATION OF CERVICAL HEMATOMA;  Surgeon: Newman Pies, MD;  Location: [place] [facility];  Service: Neurosurgery;  Laterality: N/A;  . NECK SURGERY     x3  . TONSILLECTOMY     OB History:  OB History  Gravida Para Term Preterm AB Living  0            SAB TAB Ectopic Multiple Live Births              Family History: Family History  Problem Relation Age of Onset  .  Depression Mother   . Migraines Mother   . Dementia Father   . Diabetes Father   . Hyperlipidemia Father   . Hyperlipidemia Brother   . Hyperlipidemia Brother   . Breast cancer Paternal Aunt        49s   Social History: Social History   Socioeconomic History  . Marital status: Single    Spouse name: Not on file  . Number of children: 0  . Years of education: some college  . Highest education level: 12th grade  Occupational History    Employer: DISABLED  . Occupation: Disabled   Social Needs  . Financial resource strain: Very  hard  . Food insecurity:    Worry: Never true    Inability: Never true  . Transportation needs:    Medical: No    Non-medical: No  Tobacco Use  . Smoking status: Former Smoker    Packs/day: 1.50    Years: 20.00    Pack years: 30.00    Types: Cigarettes    Start date: 03/19/1979    Last attempt to quit: 08/25/1999    Years since quitting: 18.4  . Smokeless tobacco: Never Used  . Tobacco comment: smoking cessation materials not required  Substance and Sexual Activity  . Alcohol use: Not Currently    Alcohol/week: 0.0 standard drinks  . Drug use: No  . Sexual activity: Never  Lifestyle  . Physical activity:    Days per week: 0 days    Minutes per session: 0 min  . Stress: Very much  Relationships  . Social connections:    Talks on phone: Patient refused    Gets together: Patient refused    Attends religious service: Patient refused    Active member of club or organization: Patient refused    Attends meetings of clubs or organizations: Patient refused    Relationship status: Patient refused  . Intimate partner violence:    Fear of current or ex partner: No    Emotionally abused: No    Physically abused: No    Forced sexual activity: No  Other Topics Concern  . Not on file  Social History Narrative   Patient is single.    Patient lives with roommates.    Patient on disability    Patient has no children.    Patient has some college     Allergies: No Known Allergies  Current Medications: Current Outpatient Medications  Medication Sig Dispense Refill  . albuterol (PROVENTIL HFA;VENTOLIN HFA) 108 (90 Base) MCG/ACT inhaler Inhale 2 puffs into the lungs every 6 (six) hours as needed for wheezing or shortness of breath. 1 Inhaler 2  . albuterol (PROVENTIL) (2.5 MG/3ML) 0.083% nebulizer solution Take 3 mLs (2.5 mg total) by nebulization every 6 (six) hours as needed for wheezing or shortness of breath. 75 mL 2  . Ascorbic Acid (VITAMIN C PO) Take 1 tablet by mouth daily.      . budesonide-formoterol (SYMBICORT) 160-4.5 MCG/ACT inhaler Inhale 2 puffs into the lungs 2 (two) times daily.    . Cholecalciferol (VITAMIN D-1000 MAX ST) 1000 UNITS tablet Take 1 tablet by mouth 2 (two) times daily.    . Cyanocobalamin (B-12 PO) Take 1 tablet by mouth daily.     . cyclobenzaprine (FLEXERIL) 10 MG tablet TAKE 1 TABLET BY MOUTH THREE TIMES A DAY IF NEEDED FOR MUSCLE SPASM 30 tablet 0  . DULoxetine (CYMBALTA) 60 MG capsule TAKE 1 CAPSULE(60 MG) BY MOUTH DAILY 90 capsule 0  . estradiol (  ESTRACE) 0.5 MG tablet Take 1 tablet (0.5 mg total) by mouth daily. 90 tablet 4  . fentaNYL (DURAGESIC - DOSED MCG/HR) 50 MCG/HR Place 1 patch (50 mcg total) onto the skin every 3 (three) days. 10 patch 0  . ferrous sulfate (IRON SUPPLEMENT) 325 (65 FE) MG tablet Take 1 tablet by mouth daily.    . fluticasone (FLONASE) 50 MCG/ACT nasal spray Place 2 sprays into both nostrils as needed. 16 g 5  . gabapentin (NEURONTIN) 300 MG capsule Take 1-3 capsules (300-900 mg total) by mouth 3 (three) times daily. 300 mg twice a day and 900 mg at night (Patient taking differently: Take 900 mg by mouth 2 (two) times daily. 300 mg twice a day and 900 mg at night) 540 capsule 1  . Galcanezumab-gnlm (EMGALITY) 120 MG/ML SOSY Inject 120 mg into the skin every 30 (thirty) days. 1 mL 12  . ipratropium (ATROVENT HFA) 17 MCG/ACT inhaler Inhale 2 puffs into the lungs every 6 (six) hours as needed.     . lidocaine (XYLOCAINE) 2 % solution Use as directed 15 mLs in the mouth or throat as needed for mouth pain. 100 mL 0  . Lysine 1000 MG TABS Take 1 tablet by mouth daily.     . Magnesium Oxide 500 MG CAPS Take 500 mg by mouth 2 (two) times daily.     . Multiple Vitamins-Minerals (MULTIVITAMIN PO) Take 1 tablet by mouth daily.     . Omega-3 Fatty Acids (FISH OIL PO) Take 1 capsule by mouth 2 (two) times daily.     Marland Kitchen oxyCODONE-acetaminophen (PERCOCET) 10-325 MG tablet Take 1 tablet by mouth every 8 (eight) hours as needed for  pain. 90 tablet 0  . potassium gluconate 595 MG TABS tablet Take 595 mg by mouth daily.    . temazepam (RESTORIL) 15 MG capsule Take 1 capsule (15 mg total) by mouth at bedtime as needed for sleep. 30 capsule 2  . magic mouthwash w/lidocaine SOLN Take 5 mLs by mouth 3 (three) times daily. (Patient not taking: Reported on 01/28/2018) 315 mL 0  . SUMAtriptan (IMITREX) 100 MG tablet May repeat in 2 hours if headache persists or recurs. (Patient not taking: Reported on 01/28/2018) 27 tablet 0  . triamcinolone (KENALOG) 0.1 % paste Use as directed 1 application in the mouth or throat 2 (two) times daily. (Patient not taking: Reported on 01/12/2018) 5 g 2  . urea (CARMOL) 10 % cream Apply topically as needed. (Patient not taking: Reported on 01/12/2018) 410 g 1  . valACYclovir (VALTREX) 1000 MG tablet TAKE 1 TABLET(1000 MG) BY MOUTH THREE TIMES DAILY FOR 7 DAYS (Patient not taking: Reported on 01/12/2018) 21 tablet 0   No current facility-administered medications for this visit.    Facility-Administered Medications Ordered in Other Visits  Medication Dose Route Frequency Provider Last Rate Last Dose  . 0.9 %  sodium chloride infusion   Intravenous Once Lloyd Huger, MD      . 0.9 %  sodium chloride infusion   Intravenous Once Lloyd Huger, MD      . dexamethasone (DECADRON) 20 mg in sodium chloride 0.9 % 50 mL IVPB  20 mg Intravenous Once Lloyd Huger, MD      . dexamethasone (DECADRON) injection 10 mg  10 mg Intravenous Once Lloyd Huger, MD      . sodium chloride flush (NS) 0.9 % injection 10 mL  10 mL Intravenous PRN Lloyd Huger, MD   10  mL at 06/16/17 0854   Review of Systems General:  no complaints Skin: no complaints Eyes: no complaints HEENT: no complaints Breasts: no complaints Pulmonary: no complaints Cardiac: no complaints Gastrointestinal: no complaints Genitourinary/Sexual: no complaints Ob/Gyn: per HPI Musculoskeletal: no  complaints Hematology: no complaints Neurologic/Psych: no complaints    Objective:  Physical Examination:  BP (!) 144/83   Pulse 83   Temp (!) 97.2 F (36.2 C) (Tympanic)   Resp 18   Ht _0  (1.575 m)   Wt 153 lb 9.6 oz (69.7 kg)   BMI 28.09 kg/m   ECOG Performance Status: 1 - Symptomatic but completely ambulatory  GENERAL: Patient is a well appearing female in no acute distress HEENT:  PERRL, neck supple with midline trachea. Thyroid without masses.  NODES:  No cervical, supraclavicular, axillary, or inguinal lymphadenopathy palpated.  LUNGS:  Clear to auscultation bilaterally.  No wheezes or rhonchi. HEART:  Regular rate and rhythm. No murmur appreciated. ABDOMEN:  Soft, nontender.  Positive, normoactive bowel sounds. No hernias. MSK:  No focal spinal tenderness to palpation. Full range of motion bilaterally in the upper extremities. Well healed surgical scars without hernias.  EXTREMITIES:  No peripheral edema.   SKIN:  Clear with no obvious rashes or skin changes. No nail dyscrasia. NEURO:  Nonfocal. Well oriented.  Appropriate affect.  Pelvic: Exam chaperoned by CMA: Vulva-normal-appearing without masses, tenderness or lesions.  Vagina: Normal vagina without discharge: Cervix: Normal appearing.  Bimanual/RV: Normal     Assessment:  Jessica Burgess is a 62 y.o. female diagnosed with IIIC high grade serous ovarian cancer, s/p diagnostic laparoscopy and omental biopsy on 11/05/16. Single infusion of Pembrolizumab 274m 11/11/16 on [employer] trial.  Optimal tumor debulking 11/19/16 and [facility]([place] with [place].  Had persistent disease based on CT scan and CA125 at completion of primary therapy.  Transitioned to doxil/avastin with down trending CA125, but still 114 and chemo in 9/19.  She has only minimal symptoms presently.    [facility]. Genetic testing with [employer] panel  negative for BRCA1/2 mutations or HRD.  Medical co-morbidities complicating care: hyperlipidemia, extensive spinal surgery, anxiety/depression  .  Plan:   Problem List Items Addressed This Visit      Endocrine   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     We discussed that stage III ovarian cancer is not usually curable and that the goad is extending life with the best possible quality.  She had a partial response to second line doxil and avastin and is now on treatment holiday.  CA125 remains elevated and she is scheduled for another CT scan next week.  She is agreeable to continuing treatment holiday if CT scan is relatively reassruing.  Discussed that we would recommend gemzar for further therapy is she develops more symptomatic progression.  She does not have BRCA germline mutation of HRD, so PARP inhibitor less likely to be of benefit.  She might also want to consider GY005 trial with cediranib and Olapraib vs standard chemo, which could be gemzar if she randomized to that arm.    She will continue follow up with Dr FGrayland Ormondand we can see her back in about 3 months.     LBeckey Rutter DNP, AGNP-C [phone](work cell) [phone](office)  CC:  SSteele Sizer [id number] Rockland LaneSNanuet1SlaughterBDowell[place] [phone] I  personally interviewed and examined the patient. Agreed with the above/below plan of care. Patient/family questions were answered.  Mellody Drown, MD

## 2018-01-31 NOTE — Progress Notes (Signed)
Jessica Burgess  [phone] [phone]) [phone]  ID: [name] OB: 09/12/1955  MR#: [id number]  CHE#:[id number]  Patient Care Team: Steele Sizer, MD as PCP - General (Family Medicine) Lloyd Huger, MD as Consulting Physician (Oncology) Mellody Drown, MD as Consulting Physician (Obstetrics and Gynecology) Cathi Roan, [facility] (Pharmacist) Benedetto Goad, RN as Case Manager  CHIEF COMPLAINT: Stage IIIc high-grade serous ovarian carcinoma  INTERVAL HISTORY: Patient returns to clinic today for repeat laboratory work, further evaluation, discussion of her imaging results, and treatment planning.  She feels significantly improved and is nearly back to her baseline.  Her peripheral neuropathy is unchanged.  She has no other neurologic complaints.  She denies any recent fevers or illnesses.  She has a good appetite and her weight has remained stable. She has no chest pain or shortness of breath. She denies any nausea, vomiting, constipation, or diarrhea.  She denies any abdominal pain or bloating.  She has no urinary complaints.  Patient offers no specific complaints today.  REVIEW OF SYSTEMS:   Review of Systems  Constitutional: Negative.  Negative for fever, malaise/fatigue and weight loss.  HENT: Negative.  Negative for sore throat.   Respiratory: Negative.  Negative for cough and shortness of breath.   Cardiovascular: Negative.  Negative for chest pain and leg swelling.  Gastrointestinal: Negative.  Negative for abdominal pain, blood in stool, constipation, diarrhea, melena, nausea and vomiting.  Genitourinary: Negative.  Negative for dysuria.  Musculoskeletal: Negative.  Negative for back pain, joint pain and neck pain.  Skin: Negative.  Negative for itching and rash.  Neurological: Positive for tingling and sensory change. Negative for focal weakness and weakness.  Psychiatric/Behavioral: Negative.  The patient is not nervous/anxious.      As per HPI. Otherwise, a complete review of systems is negative.  PAST MEDICAL HISTORY: Past Medical History:  Diagnosis Date  . Allergic rhinitis, cause unspecified   . Anxiety state, unspecified   . Arthritis   . Asthma    only when sick   . Backache, unspecified   . Bronchitis    hx of when get sick  . Cancer ([place])    skin cancer , basal cell   . Cancer ([place]) 11/2016   ovarian  . Cervicalgia   . Complication of anesthesia   . Dermatophytosis of nail   . Dysmetabolic syndrome X   . Encounter for long-term (current) use of other medications   . Esophageal reflux   . Insomnia, unspecified   . Leukocytosis, unspecified   . Migraine without aura, without mention of intractable migraine without mention of status migrainosus   . Other and unspecified hyperlipidemia   . Other malaise and fatigue   . Overweight(278.02)   . Personal history of chemotherapy now   ovarian  . PONV (postoperative nausea and vomiting)   . Spinal stenosis in cervical region   . Symptomatic menopausal or female climacteric states   . Unspecified disorder of skin and subcutaneous tissue   . Unspecified vitamin D deficiency     PAST SURGICAL HISTORY: Past Surgical History:  Procedure Laterality Date  . ABDOMINAL HYSTERECTOMY    . ANTERIOR CERVICAL DECOMP/DISCECTOMY FUSION N/A 06/07/2015   Procedure: Cervical three - four and Cervical six- seven anterior cervical decompression with fusion interbody prosthesis plating and bonegraft;  Surgeon: Newman Pies, MD;  Location: [place] [facility];  Service: Neurosurgery;  Laterality: N/A;  C34 and C67 anterior cervical decompression with fusion interbody prosthesis plating and bonegraft  .  BACK SURGERY     x2 Lower   . EVACUATION OF CERVICAL HEMATOMA N/A 06/14/2015   Procedure: EVACUATION OF CERVICAL HEMATOMA;  Surgeon: Newman Pies, MD;  Location: [place] [facility];  Service: Neurosurgery;  Laterality: N/A;  . NECK SURGERY     x3  . TONSILLECTOMY       FAMILY HISTORY: Family History  Problem Relation Age of Onset  . Depression Mother   . Migraines Mother   . Dementia Father   . Diabetes Father   . Hyperlipidemia Father   . Hyperlipidemia Brother   . Hyperlipidemia Brother   . Breast cancer Paternal Aunt        41s    ADVANCED DIRECTIVES (Y/N):  N  HEALTH MAINTENANCE: Social History   Tobacco Use  . Smoking status: Former Smoker    Packs/day: 1.50    Years: 20.00    Pack years: 30.00    Types: Cigarettes    Start date: 03/19/1979    Last attempt to quit: 08/25/1999    Years since quitting: 18.4  . Smokeless tobacco: Never Used  . Tobacco comment: smoking cessation materials not required  Substance Use Topics  . Alcohol use: Not Currently    Alcohol/week: 0.0 standard drinks  . Drug use: No     Colonoscopy:  PAP:  Bone density:  Lipid panel:  No Known Allergies  Current Outpatient Medications  Medication Sig Dispense Refill  . albuterol (PROVENTIL HFA;VENTOLIN HFA) 108 (90 Base) MCG/ACT inhaler Inhale 2 puffs into the lungs every 6 (six) hours as needed for wheezing or shortness of breath. 1 Inhaler 2  . albuterol (PROVENTIL) (2.5 MG/3ML) 0.083% nebulizer solution Take 3 mLs (2.5 mg total) by nebulization every 6 (six) hours as needed for wheezing or shortness of breath. 75 mL 2  . Ascorbic Acid (VITAMIN C PO) Take 1 tablet by mouth daily.     . budesonide-formoterol (SYMBICORT) 160-4.5 MCG/ACT inhaler Inhale 2 puffs into the lungs 2 (two) times daily.    . Cholecalciferol (VITAMIN D-1000 MAX ST) 1000 UNITS tablet Take 1 tablet by mouth 2 (two) times daily.    . Cyanocobalamin (B-12 PO) Take 1 tablet by mouth daily.     . cyclobenzaprine (FLEXERIL) 10 MG tablet TAKE 1 TABLET BY MOUTH THREE TIMES A DAY IF NEEDED FOR MUSCLE SPASM 30 tablet 0  . DULoxetine (CYMBALTA) 60 MG capsule TAKE 1 CAPSULE(60 MG) BY MOUTH DAILY 90 capsule 0  . estradiol (ESTRACE) 0.5 MG tablet Take 1 tablet (0.5 mg total) by mouth daily. 90  tablet 4  . fentaNYL (DURAGESIC - DOSED MCG/HR) 50 MCG/HR Place 1 patch (50 mcg total) onto the skin every 3 (three) days. 10 patch 0  . ferrous sulfate (IRON SUPPLEMENT) 325 (65 FE) MG tablet Take 1 tablet by mouth daily.    . fluticasone (FLONASE) 50 MCG/ACT nasal spray Place 2 sprays into both nostrils as needed. 16 g 5  . gabapentin (NEURONTIN) 300 MG capsule Take 1-3 capsules (300-900 mg total) by mouth 3 (three) times daily. 300 mg twice a day and 900 mg at night (Patient taking differently: Take 900 mg by mouth 2 (two) times daily. 300 mg twice a day and 900 mg at night) 540 capsule 1  . Galcanezumab-gnlm (EMGALITY) 120 MG/ML SOSY Inject 120 mg into the skin every 30 (thirty) days. 1 mL 12  . ipratropium (ATROVENT HFA) 17 MCG/ACT inhaler Inhale 2 puffs into the lungs every 6 (six) hours as needed.     Marland Kitchen  lidocaine (XYLOCAINE) 2 % solution Use as directed 15 mLs in the mouth or throat as needed for mouth pain. 100 mL 0  . Lysine 1000 MG TABS Take 1 tablet by mouth daily.     . magic mouthwash w/lidocaine SOLN Take 5 mLs by mouth 3 (three) times daily. 315 mL 0  . Magnesium Oxide 500 MG CAPS Take 500 mg by mouth 2 (two) times daily.     . Multiple Vitamins-Minerals (MULTIVITAMIN PO) Take 1 tablet by mouth daily.     . Omega-3 Fatty Acids (FISH OIL PO) Take 1 capsule by mouth 2 (two) times daily.     . potassium gluconate 595 MG TABS tablet Take 595 mg by mouth daily.    . SUMAtriptan (IMITREX) 100 MG tablet May repeat in 2 hours if headache persists or recurs. 27 tablet 0  . temazepam (RESTORIL) 15 MG capsule Take 1 capsule (15 mg total) by mouth at bedtime as needed for sleep. 30 capsule 2  . triamcinolone (KENALOG) 0.1 % paste Use as directed 1 application in the mouth or throat 2 (two) times daily. 5 g 2  . urea (CARMOL) 10 % cream Apply topically as needed. 410 g 1  . valACYclovir (VALTREX) 1000 MG tablet TAKE 1 TABLET(1000 MG) BY MOUTH THREE TIMES DAILY FOR 7 DAYS 21 tablet 0  .  oxyCODONE-acetaminophen (PERCOCET) 10-325 MG tablet Take 1 tablet by mouth every 8 (eight) hours as needed for pain. 90 tablet 0   No current facility-administered medications for this visit.    Facility-Administered Medications Ordered in Other Visits  Medication Dose Route Frequency Provider Last Rate Last Dose  . 0.9 %  sodium chloride infusion   Intravenous Once Lloyd Huger, MD      . 0.9 %  sodium chloride infusion   Intravenous Once Lloyd Huger, MD      . dexamethasone (DECADRON) 20 mg in sodium chloride 0.9 % 50 mL IVPB  20 mg Intravenous Once Lloyd Huger, MD      . dexamethasone (DECADRON) injection 10 mg  10 mg Intravenous Once Lloyd Huger, MD      . sodium chloride flush (NS) 0.9 % injection 10 mL  10 mL Intravenous PRN Lloyd Huger, MD   10 mL at 06/16/17 0854    OBJECTIVE: Vitals:   02/05/18 1009 02/05/18 1017  BP:  119/76  Pulse:  82  Resp: 12   Temp:  99 F (37.2 C)     Body mass index is 28.2 kg/m.    ECOG FS:0 - Asymptomatic  General: Well-developed, well-nourished, no acute distress. Eyes: Pink conjunctiva, anicteric sclera. HEENT: Normocephalic, moist mucous membranes. Lungs: Clear to auscultation bilaterally. Heart: Regular rate and rhythm. No rubs, murmurs, or gallops. Abdomen: Soft, nontender, nondistended. No organomegaly noted, normoactive bowel sounds. Musculoskeletal: No edema, cyanosis, or clubbing. Neuro: Alert, answering all questions appropriately. Cranial nerves grossly intact. Skin: No rashes or petechiae noted. Psych: Normal affect.   LAB RESULTS:  Lab Results  Component Value Date   NA 135 02/02/2018   K 4.7 02/02/2018   CL 95 (L) 02/02/2018   CO2 30 02/02/2018   GLUCOSE 109 (H) 02/02/2018   BUN 11 02/02/2018   CREATININE 0.85 02/02/2018   CALCIUM 9.4 02/02/2018   PROT 7.8 02/02/2018   ALBUMIN 4.2 02/02/2018   AST 35 02/02/2018   ALT 23 02/02/2018   ALKPHOS 79 02/02/2018   BILITOT 0.2 (L)  02/02/2018   GFRNONAA >60 02/02/2018  GFRAA >60 02/02/2018    Lab Results  Component Value Date   WBC 5.9 02/02/2018   NEUTROABS 3.2 02/02/2018   HGB 14.1 02/02/2018   HCT 43.2 02/02/2018   MCV 96.6 02/02/2018   PLT 259 02/02/2018     STUDIES: Ct Chest W Contrast  Result Date: 02/02/2018 CLINICAL DATA:  Rising CEA level.  Ovarian cancer. EXAM: CT CHEST, ABDOMEN, AND PELVIS WITH CONTRAST TECHNIQUE: Multidetector CT imaging of the chest, abdomen and pelvis was performed following the standard protocol during bolus administration of intravenous contrast. CONTRAST:  136mL OMNIPAQUE IOHEXOL 300 MG/ML  SOLN COMPARISON:  06/19/2017 FINDINGS: CT CHEST FINDINGS Cardiovascular: Right Port-A-Cath tip: SVC. Atherosclerotic calcification of the aortic arch and branch vessels. Mildly ectatic ascending aorta at 3.7 cm diameter. Mediastinum/Nodes: Unremarkable Lungs/Pleura: 3 mm calcified granuloma in the lingula. Mild scarring in the left lower lobe. Prior nodularity along the left inferior pulmonary ligament and left lower lobe scarring has essentially resolved. Right middle lobe subpleural nodule anteriorly on image 87/4 is stable at 4 by 3 mm. Musculoskeletal: Lower cervical anterior plate and screw fixator. Thoracic spondylosis. CT ABDOMEN PELVIS FINDINGS Hepatobiliary: Cholecystectomy. Stable extrahepatic biliary dilatation, much of which may be a physiologic response to the cholecystectomy. No significant focal liver lesions identified. Pancreas: Unremarkable Spleen: Unremarkable Adrenals/Urinary Tract: Unremarkable Stomach/Bowel: Prominent stool throughout the colon favors constipation. Vascular/Lymphatic: Aortoiliac atherosclerotic vascular disease. Reproductive: Absent uterus.  Ovaries not well seen. Other: Mild increase in left upper quadrant nodularity compared to previous. For example, one conglomerate nodule measures 1.0 by 1.5 cm on image 69/2, which is increased from the previous nodularity.  Prior omentectomy noted. Musculoskeletal: Graft harvest from the left iliac bone. Degenerative spurring of the SI joints. Posterolateral rod and pedicle screw fixation in the L4-5 level with interbody fusion. Lumbar spondylosis and degenerative disc disease with right foraminal narrowing at L2-3 due to facet spurring. Small ventral hernia contains adipose tissue on image 64/2. IMPRESSION: 1. Mild increase in nodularity along the left upper quadrant omentum compatible with mild progression. 2. Prior nodularity along inferior pulmonary ligament has resolved and is thought to of been benign. Chronic right middle lobe subpleural nodule likewise probably benign although merit surveillance. 3. Other imaging findings of potential clinical significance: Aortoiliac atherosclerotic vascular disease. Prominent stool throughout the colon favors constipation. Small ventral hernia containing adipose tissue. Electronically Signed   By: Van Clines M.D.   On: 02/02/2018 14:35   Ct Abdomen Pelvis W Contrast  Result Date: 02/02/2018 CLINICAL DATA:  Rising CEA level.  Ovarian cancer. EXAM: CT CHEST, ABDOMEN, AND PELVIS WITH CONTRAST TECHNIQUE: Multidetector CT imaging of the chest, abdomen and pelvis was performed following the standard protocol during bolus administration of intravenous contrast. CONTRAST:  168mL OMNIPAQUE IOHEXOL 300 MG/ML  SOLN COMPARISON:  06/19/2017 FINDINGS: CT CHEST FINDINGS Cardiovascular: Right Port-A-Cath tip: SVC. Atherosclerotic calcification of the aortic arch and branch vessels. Mildly ectatic ascending aorta at 3.7 cm diameter. Mediastinum/Nodes: Unremarkable Lungs/Pleura: 3 mm calcified granuloma in the lingula. Mild scarring in the left lower lobe. Prior nodularity along the left inferior pulmonary ligament and left lower lobe scarring has essentially resolved. Right middle lobe subpleural nodule anteriorly on image 87/4 is stable at 4 by 3 mm. Musculoskeletal: Lower cervical anterior  plate and screw fixator. Thoracic spondylosis. CT ABDOMEN PELVIS FINDINGS Hepatobiliary: Cholecystectomy. Stable extrahepatic biliary dilatation, much of which may be a physiologic response to the cholecystectomy. No significant focal liver lesions identified. Pancreas: Unremarkable Spleen: Unremarkable Adrenals/Urinary Tract: Unremarkable Stomach/Bowel: Prominent  stool throughout the colon favors constipation. Vascular/Lymphatic: Aortoiliac atherosclerotic vascular disease. Reproductive: Absent uterus.  Ovaries not well seen. Other: Mild increase in left upper quadrant nodularity compared to previous. For example, one conglomerate nodule measures 1.0 by 1.5 cm on image 69/2, which is increased from the previous nodularity. Prior omentectomy noted. Musculoskeletal: Graft harvest from the left iliac bone. Degenerative spurring of the SI joints. Posterolateral rod and pedicle screw fixation in the L4-5 level with interbody fusion. Lumbar spondylosis and degenerative disc disease with right foraminal narrowing at L2-3 due to facet spurring. Small ventral hernia contains adipose tissue on image 64/2. IMPRESSION: 1. Mild increase in nodularity along the left upper quadrant omentum compatible with mild progression. 2. Prior nodularity along inferior pulmonary ligament has resolved and is thought to of been benign. Chronic right middle lobe subpleural nodule likewise probably benign although merit surveillance. 3. Other imaging findings of potential clinical significance: Aortoiliac atherosclerotic vascular disease. Prominent stool throughout the colon favors constipation. Small ventral hernia containing adipose tissue. Electronically Signed   By: Van Clines M.D.   On: 02/02/2018 14:35    ONCOLOGY HISTORY: Patient underwent debulking [facility] at [facility] on November 19, 2016. She received one infusion preoperative infusion of Keytruda on November 11, 2016 as part of a clinical trial.  She initiated adjuvant  carboplatinum and Taxol on December 16, 2016.  This was continued through April 28, 2017 at which time Taxol was discontinued secondary to worsening neuropathy.  Patient then received single agent carboplatinum for additional 2 infusions.  She was then noted to have progression of disease and was switched to Avastin and doxorubicin on August 18, 2017 this was discontinued on November 24, 2017 secondary to declining performance status.     ASSESSMENT: Stage IIIc high-grade serous ovarian carcinoma  PLAN:  1. Stage IIIc high-grade serous ovarian carcinoma: See oncology history as above.  Patient's performance status has significantly improved and she is nearly back to her baseline.  Her CA-125 continues to trend up and is now 253.  CT scans on February 02, 2018 reviewed independently and report as above with progression of disease.  After lengthy discussion with the patient, she wishes to continue treatment, but wishes to pursue less toxic regimen.  Plan on using gemcitabine on days 1, 8, and 15 with a 22 off.  Return to clinic on February 19, 2018 to initiate cycle 1, day 1. 2. Pain: Well controlled. Continue fentanyl patch and oxycodone. Previously discussed with patient about referring back to PCP for pain management at conclusion of therapy. 3. Anxiety: Chronic and unchanged.  Continue Xanax as prescribed. 4.  Peripheral neuropathy: Continue gabapentin as prescribed. Will consider neurology referral in the future.   5.  Anemia: Resolved.   6.  Mouth sores: Resolved.  Continue maintenance Valtrex and Magic mouthwash as needed. 7.  Hand/foot pain: Secondary to Doxil.  Resolved.  I spent a total of 30 minutes face-to-face with the patient of which greater than 50% of the visit was spent in counseling and coordination of care as detailed above.  Patient expressed understanding and was in agreement with this plan. She also understands that She can call clinic at any time with any questions, concerns, or  complaints.   Cancer Staging Ovarian cancer, unspecified laterality ([place]) Staging form: Ovary, Fallopian Tube, and Primary Peritoneal Carcinoma, AJCC 8th Edition - Clinical: No stage assigned - Unsigned   Lloyd Huger, MD   02/10/2018 9:46 AM

## 2018-02-02 ENCOUNTER — Other Ambulatory Visit: Payer: Self-pay

## 2018-02-02 ENCOUNTER — Ambulatory Visit
Admission: RE | Admit: 2018-02-02 | Discharge: 2018-02-02 | Disposition: A | Discharge status: Home / Self Care | Payer: PPO | Source: Ambulatory Visit | Attending: Oncology | Admitting: Oncology

## 2018-02-02 ENCOUNTER — Inpatient Hospital Stay: Payer: PPO

## 2018-02-02 DIAGNOSIS — C569 Malignant neoplasm of unspecified ovary: Secondary | ICD-10-CM | POA: Insufficient documentation

## 2018-02-02 DIAGNOSIS — K439 Ventral hernia without obstruction or gangrene: Secondary | ICD-10-CM | POA: Diagnosis not present

## 2018-02-02 DIAGNOSIS — I700 Atherosclerosis of aorta: Secondary | ICD-10-CM | POA: Insufficient documentation

## 2018-02-02 DIAGNOSIS — R918 Other nonspecific abnormal finding of lung field: Secondary | ICD-10-CM | POA: Insufficient documentation

## 2018-02-02 LAB — PROTEIN, URINE, RANDOM

## 2018-02-02 LAB — CBC WITH DIFFERENTIAL/PLATELET
Abs Immature Granulocytes: 0.03 10*3/uL (ref 0.00–0.07)
BASOS ABS: 0.1 10*3/uL (ref 0.0–0.1)
BASOS PCT: 1 %
Eosinophils Absolute: 0.2 10*3/uL (ref 0.0–0.5)
Eosinophils Relative: 4 %
HCT: 43.2 % (ref 36.0–46.0)
Hemoglobin: 14.1 g/dL (ref 12.0–15.0)
IMMATURE GRANULOCYTES: 1 %
Lymphocytes Relative: 32 %
Lymphs Abs: 1.9 10*3/uL (ref 0.7–4.0)
MCH: 31.5 pg (ref 26.0–34.0)
MCHC: 32.6 g/dL (ref 30.0–36.0)
MCV: 96.6 fL (ref 80.0–100.0)
Monocytes Absolute: 0.5 10*3/uL (ref 0.1–1.0)
Monocytes Relative: 8 %
NEUTROS PCT: 54 %
NRBC: 0 % (ref 0.0–0.2)
Neutro Abs: 3.2 10*3/uL (ref 1.7–7.7)
PLATELETS: 259 10*3/uL (ref 150–400)
RBC: 4.47 MIL/uL (ref 3.87–5.11)
RDW: 12.5 % (ref 11.5–15.5)
WBC: 5.9 10*3/uL (ref 4.0–10.5)

## 2018-02-02 LAB — COMPREHENSIVE METABOLIC PANEL
ALBUMIN: 4.2 g/dL (ref 3.5–5.0)
ALT: 23 U/L (ref 0–44)
AST: 35 U/L (ref 15–41)
Alkaline Phosphatase: 79 U/L (ref 38–126)
Anion gap: 10 (ref 5–15)
BUN: 11 mg/dL (ref 8–23)
CHLORIDE: 95 mmol/L — AB (ref 98–111)
CO2: 30 mmol/L (ref 22–32)
Calcium: 9.4 mg/dL (ref 8.9–10.3)
Creatinine, Ser: 0.85 mg/dL (ref 0.44–1.00)
GFR calc Af Amer: >60 mL/min (ref >60)
GFR calc non Af Amer: >60 mL/min (ref >60)
GLUCOSE: 109 mg/dL — AB (ref 70–99)
POTASSIUM: 4.7 mmol/L (ref 3.5–5.1)
Sodium: 135 mmol/L (ref 135–145)
Total Bilirubin: 0.2 mg/dL — ABNORMAL LOW (ref 0.3–1.2)
Total Protein: 7.8 g/dL (ref 6.5–8.1)

## 2018-02-02 MED ORDER — IOHEXOL 300 MG/ML  SOLN
100.0000 mL | Freq: Once | INTRAMUSCULAR | Status: AC | PRN
  Administered 2018-02-02: 100 mL via INTRAVENOUS

## 2018-02-03 LAB — CA 125: CANCER ANTIGEN (CA) 125: 253 U/mL — AB (ref 0.0–38.1)

## 2018-02-05 ENCOUNTER — Inpatient Hospital Stay (HOSPITAL_BASED_OUTPATIENT_CLINIC_OR_DEPARTMENT_OTHER): Payer: PPO | Admitting: Oncology

## 2018-02-05 ENCOUNTER — Other Ambulatory Visit: Payer: Self-pay

## 2018-02-05 ENCOUNTER — Encounter: Payer: Self-pay | Admitting: Oncology

## 2018-02-05 ENCOUNTER — Other Ambulatory Visit: Payer: Self-pay | Admitting: *Deleted

## 2018-02-05 VITALS — BP 119/76 | HR 82 | Temp 99.0°F | Resp 12 | Ht 62.0 in | Wt 154.2 lb

## 2018-02-05 DIAGNOSIS — C786 Secondary malignant neoplasm of retroperitoneum and peritoneum: Secondary | ICD-10-CM | POA: Diagnosis not present

## 2018-02-05 DIAGNOSIS — G629 Polyneuropathy, unspecified: Secondary | ICD-10-CM

## 2018-02-05 DIAGNOSIS — Z87891 Personal history of nicotine dependence: Secondary | ICD-10-CM | POA: Diagnosis not present

## 2018-02-05 DIAGNOSIS — R971 Elevated cancer antigen 125 [CA 125]: Secondary | ICD-10-CM | POA: Diagnosis not present

## 2018-02-05 DIAGNOSIS — M542 Cervicalgia: Secondary | ICD-10-CM

## 2018-02-05 DIAGNOSIS — Z90710 Acquired absence of both cervix and uterus: Secondary | ICD-10-CM | POA: Diagnosis not present

## 2018-02-05 DIAGNOSIS — Z90722 Acquired absence of ovaries, bilateral: Secondary | ICD-10-CM

## 2018-02-05 DIAGNOSIS — Z9221 Personal history of antineoplastic chemotherapy: Secondary | ICD-10-CM | POA: Diagnosis not present

## 2018-02-05 DIAGNOSIS — M5441 Lumbago with sciatica, right side: Principal | ICD-10-CM

## 2018-02-05 DIAGNOSIS — C569 Malignant neoplasm of unspecified ovary: Secondary | ICD-10-CM

## 2018-02-05 DIAGNOSIS — G8929 Other chronic pain: Secondary | ICD-10-CM

## 2018-02-05 MED ORDER — OXYCODONE-ACETAMINOPHEN 10-325 MG PO TABS: 1.0000 | ORAL_TABLET | Freq: Three times a day (TID) | ORAL | 0 refills | Status: DC | PRN

## 2018-02-05 NOTE — Progress Notes (Signed)
Patient here for results she reports that she has increasing pain "behind her bladder".

## 2018-02-09 DIAGNOSIS — M67813 Other specified disorders of tendon, right shoulder: Secondary | ICD-10-CM | POA: Diagnosis not present

## 2018-02-09 DIAGNOSIS — M25512 Pain in left shoulder: Secondary | ICD-10-CM | POA: Diagnosis not present

## 2018-02-09 DIAGNOSIS — M7552 Bursitis of left shoulder: Secondary | ICD-10-CM | POA: Diagnosis not present

## 2018-02-09 DIAGNOSIS — M75121 Complete rotator cuff tear or rupture of right shoulder, not specified as traumatic: Secondary | ICD-10-CM | POA: Diagnosis not present

## 2018-02-09 DIAGNOSIS — M7541 Impingement syndrome of right shoulder: Secondary | ICD-10-CM | POA: Diagnosis not present

## 2018-02-09 DIAGNOSIS — M7542 Impingement syndrome of left shoulder: Secondary | ICD-10-CM | POA: Diagnosis not present

## 2018-02-09 DIAGNOSIS — G8929 Other chronic pain: Secondary | ICD-10-CM | POA: Diagnosis not present

## 2018-02-09 DIAGNOSIS — M25511 Pain in right shoulder: Secondary | ICD-10-CM | POA: Diagnosis not present

## 2018-02-10 NOTE — Progress Notes (Signed)
DISCONTINUE ON PATHWAY REGIMEN - Ovarian     A cycle is every 28 days:     Liposomal doxorubicin      Bevacizumab   **Always confirm dose/schedule in your pharmacy ordering system**  REASON: Toxicities / Adverse Event PRIOR TREATMENT: [id number]: Liposomal Doxorubicin (Doxil) 40 mg/m2 D1 + Bevacizumab 10 mg/kg D1, 15 q28 Days; Stop Treatment After 6 Cycles If Complete Response, Otherwise Continue Treatment Until Progression or Unacceptable Toxicity TREATMENT RESPONSE: Progressive Disease (PD)  START OFF PATHWAY REGIMEN - Ovarian   OFF00015:Gemcitabine 1,000 mg/m2 Days 1, 8, 15 q28 Days:   A cycle is every 28 days (3 weeks on and 1 week off):     Gemcitabine   **Always confirm dose/schedule in your pharmacy ordering system**  Patient Characteristics: Recurrent or Progressive Disease, Third Line Therapy, Platinum Resistant or < 6 Months Since Last Platinum Therapy Therapeutic Status: Recurrent or Progressive Disease BRCA Mutation Status: Absent Line of Therapy: Third Line  Intent of Therapy: Non-Curative / Palliative Intent, Discussed with Patient

## 2018-02-11 ENCOUNTER — Other Ambulatory Visit: Payer: Self-pay | Admitting: Oncology

## 2018-02-11 DIAGNOSIS — M5441 Lumbago with sciatica, right side: Principal | ICD-10-CM

## 2018-02-11 DIAGNOSIS — C569 Malignant neoplasm of unspecified ovary: Secondary | ICD-10-CM

## 2018-02-11 DIAGNOSIS — G8929 Other chronic pain: Secondary | ICD-10-CM

## 2018-02-11 DIAGNOSIS — M542 Cervicalgia: Secondary | ICD-10-CM

## 2018-02-11 MED ORDER — OXYCODONE-ACETAMINOPHEN 10-325 MG PO TABS: 1.0000 | ORAL_TABLET | Freq: Three times a day (TID) | ORAL | 0 refills | Status: DC | PRN

## 2018-02-15 NOTE — Progress Notes (Signed)
[place]  [phone]) [phone] Fax:(336) [phone]  ID: Jessica Burgess OB: Aug 20, 1955  MR#: [id number]  WNU#:[id number]  Patient Care Team: Steele Sizer, MD as PCP - General (Family Medicine) Lloyd Huger, MD as Consulting Physician (Oncology) Mellody Drown, MD as Consulting Physician (Obstetrics and Gynecology) Cathi Roan, [facility] (Pharmacist) Benedetto Goad, RN as Case Manager  CHIEF COMPLAINT: Stage IIIc high-grade serous ovarian carcinoma  INTERVAL HISTORY: Patient returns to clinic today for further evaluation and consideration of cycle 1, day 1 of gemcitabine.  She currently feels well and is asymptomatic. Her peripheral neuropathy is unchanged.  She has no other neurologic complaints.  She denies any recent fevers or illnesses.  She has a good appetite and her weight has remained stable. She has no chest pain or shortness of breath. She denies any nausea, vomiting, constipation, or diarrhea.  She denies any abdominal pain or bloating.  She has no urinary complaints.  Patient feels at her baseline and offers no further specific complaints today.  REVIEW OF SYSTEMS:   Review of Systems  Constitutional: Negative.  Negative for fever, malaise/fatigue and weight loss.  HENT: Negative.  Negative for sore throat.   Respiratory: Negative.  Negative for cough and shortness of breath.   Cardiovascular: Negative.  Negative for chest pain and leg swelling.  Gastrointestinal: Negative.  Negative for abdominal pain, blood in stool, constipation, diarrhea, melena, nausea and vomiting.  Genitourinary: Negative.  Negative for dysuria.  Musculoskeletal: Positive for joint pain. Negative for back pain and neck pain.  Skin: Negative.  Negative for itching and rash.  Neurological: Positive for tingling and sensory change. Negative for focal weakness and weakness.  Psychiatric/Behavioral: Negative.  The patient is not nervous/anxious.     As per HPI.  Otherwise, a complete review of systems is negative.  PAST MEDICAL HISTORY: Past Medical History:  Diagnosis Date  . Allergic rhinitis, cause unspecified   . Anxiety state, unspecified   . Arthritis   . Asthma    only when sick   . Backache, unspecified   . Bronchitis    hx of when get sick  . Cancer ([place])    skin cancer , basal cell   . Cancer ([place]) 11/2016   ovarian  . Cervicalgia   . Complication of anesthesia   . Dermatophytosis of nail   . Dysmetabolic syndrome X   . Encounter for long-term (current) use of other medications   . Esophageal reflux   . Insomnia, unspecified   . Leukocytosis, unspecified   . Migraine without aura, without mention of intractable migraine without mention of status migrainosus   . Other and unspecified hyperlipidemia   . Other malaise and fatigue   . Overweight(278.02)   . Personal history of chemotherapy now   ovarian  . PONV (postoperative nausea and vomiting)   . Spinal stenosis in cervical region   . Symptomatic menopausal or female climacteric states   . Unspecified disorder of skin and subcutaneous tissue   . Unspecified vitamin D deficiency     PAST SURGICAL HISTORY: Past Surgical History:  Procedure Laterality Date  . ABDOMINAL HYSTERECTOMY    . ANTERIOR CERVICAL DECOMP/DISCECTOMY FUSION N/A 06/07/2015   Procedure: Cervical three - four and Cervical six- seven anterior cervical decompression with fusion interbody prosthesis plating and bonegraft;  Surgeon: Newman Pies, MD;  Location: [place] [facility];  Service: Neurosurgery;  Laterality: N/A;  C34 and C67 anterior cervical decompression with fusion interbody prosthesis plating and bonegraft  .  BACK SURGERY     x2 Lower   . EVACUATION OF CERVICAL HEMATOMA N/A 06/14/2015   Procedure: EVACUATION OF CERVICAL HEMATOMA;  Surgeon: Newman Pies, MD;  Location: [place] [facility];  Service: Neurosurgery;  Laterality: N/A;  . NECK SURGERY     x3  . TONSILLECTOMY      FAMILY  HISTORY: Family History  Problem Relation Age of Onset  . Depression Mother   . Migraines Mother   . Dementia Father   . Diabetes Father   . Hyperlipidemia Father   . Hyperlipidemia Brother   . Hyperlipidemia Brother   . Breast cancer Paternal Aunt        20s    ADVANCED DIRECTIVES (Y/N):  N  HEALTH MAINTENANCE: Social History   Tobacco Use  . Smoking status: Former Smoker    Packs/day: 1.50    Years: 20.00    Pack years: 30.00    Types: Cigarettes    Start date: 03/19/1979    Last attempt to quit: 08/25/1999    Years since quitting: 18.5  . Smokeless tobacco: Never Used  . Tobacco comment: smoking cessation materials not required  Substance Use Topics  . Alcohol use: Not Currently    Alcohol/week: 0.0 standard drinks  . Drug use: No     Colonoscopy:  PAP:  Bone density:  Lipid panel:  No Known Allergies  Current Outpatient Medications  Medication Sig Dispense Refill  . albuterol (PROVENTIL HFA;VENTOLIN HFA) 108 (90 Base) MCG/ACT inhaler Inhale 2 puffs into the lungs every 6 (six) hours as needed for wheezing or shortness of breath. 1 Inhaler 2  . albuterol (PROVENTIL) (2.5 MG/3ML) 0.083% nebulizer solution Take 3 mLs (2.5 mg total) by nebulization every 6 (six) hours as needed for wheezing or shortness of breath. 75 mL 2  . Ascorbic Acid (VITAMIN C PO) Take 1 tablet by mouth daily.     . budesonide-formoterol (SYMBICORT) 160-4.5 MCG/ACT inhaler Inhale 2 puffs into the lungs 2 (two) times daily.    . Cholecalciferol (VITAMIN D-1000 MAX ST) 1000 UNITS tablet Take 1 tablet by mouth 2 (two) times daily.    . Cyanocobalamin (B-12 PO) Take 1 tablet by mouth daily.     . cyclobenzaprine (FLEXERIL) 10 MG tablet TAKE 1 TABLET BY MOUTH THREE TIMES A DAY IF NEEDED FOR MUSCLE SPASM 30 tablet 0  . DULoxetine (CYMBALTA) 60 MG capsule TAKE 1 CAPSULE(60 MG) BY MOUTH DAILY 90 capsule 0  . estradiol (ESTRACE) 0.5 MG tablet Take 1 tablet (0.5 mg total) by mouth daily. 90 tablet 4  .  fentaNYL (DURAGESIC - DOSED MCG/HR) 50 MCG/HR Place 1 patch (50 mcg total) onto the skin every 3 (three) days. 10 patch 0  . ferrous sulfate (IRON SUPPLEMENT) 325 (65 FE) MG tablet Take 1 tablet by mouth daily.    . fluticasone (FLONASE) 50 MCG/ACT nasal spray Place 2 sprays into both nostrils as needed. 16 g 5  . gabapentin (NEURONTIN) 300 MG capsule Take 1-3 capsules (300-900 mg total) by mouth 3 (three) times daily. 300 mg twice a day and 900 mg at night (Patient taking differently: Take 900 mg by mouth 2 (two) times daily. 300 mg twice a day and 900 mg at night) 540 capsule 1  . Galcanezumab-gnlm (EMGALITY) 120 MG/ML SOSY Inject 120 mg into the skin every 30 (thirty) days. 1 mL 12  . ipratropium (ATROVENT HFA) 17 MCG/ACT inhaler Inhale 2 puffs into the lungs every 6 (six) hours as needed.     Marland Kitchen  lidocaine (XYLOCAINE) 2 % solution Use as directed 15 mLs in the mouth or throat as needed for mouth pain. 100 mL 0  . Lysine 1000 MG TABS Take 1 tablet by mouth daily.     . magic mouthwash w/lidocaine SOLN Take 5 mLs by mouth 3 (three) times daily. 315 mL 0  . Magnesium Oxide 500 MG CAPS Take 500 mg by mouth 2 (two) times daily.     . Multiple Vitamins-Minerals (MULTIVITAMIN PO) Take 1 tablet by mouth daily.     . Omega-3 Fatty Acids (FISH OIL PO) Take 1 capsule by mouth 2 (two) times daily.     Marland Kitchen oxyCODONE-acetaminophen (PERCOCET) 10-325 MG tablet Take 1 tablet by mouth every 8 (eight) hours as needed for pain. 90 tablet 0  . potassium gluconate 595 MG TABS tablet Take 595 mg by mouth daily.    . SUMAtriptan (IMITREX) 100 MG tablet May repeat in 2 hours if headache persists or recurs. 27 tablet 0  . temazepam (RESTORIL) 15 MG capsule Take 1 capsule (15 mg total) by mouth at bedtime as needed for sleep. 30 capsule 2  . triamcinolone (KENALOG) 0.1 % paste Use as directed 1 application in the mouth or throat 2 (two) times daily. 5 g 2  . urea (CARMOL) 10 % cream Apply topically as needed. 410 g 1  .  valACYclovir (VALTREX) 1000 MG tablet TAKE 1 TABLET(1000 MG) BY MOUTH THREE TIMES DAILY FOR 7 DAYS 21 tablet 0   No current facility-administered medications for this visit.    Facility-Administered Medications Ordered in Other Visits  Medication Dose Route Frequency Provider Last Rate Last Dose  . 0.9 %  sodium chloride infusion   Intravenous Once Lloyd Huger, MD      . 0.9 %  sodium chloride infusion   Intravenous Once Lloyd Huger, MD      . dexamethasone (DECADRON) 20 mg in sodium chloride 0.9 % 50 mL IVPB  20 mg Intravenous Once Lloyd Huger, MD      . dexamethasone (DECADRON) injection 10 mg  10 mg Intravenous Once Lloyd Huger, MD      . sodium chloride flush (NS) 0.9 % injection 10 mL  10 mL Intravenous PRN Lloyd Huger, MD   10 mL at 06/16/17 0854    OBJECTIVE: Vitals:   02/19/18 0910  BP: (!) 157/94  Pulse: 93  Temp: (!) 97 F (36.1 C)     Body mass index is 28.73 kg/m.    ECOG FS:0 - Asymptomatic  General: Well-developed, well-nourished, no acute distress. Eyes: Pink conjunctiva, anicteric sclera. HEENT: Normocephalic, moist mucous membranes. Lungs: Clear to auscultation bilaterally. Heart: Regular rate and rhythm. No rubs, murmurs, or gallops. Abdomen: Soft, nontender, nondistended. No organomegaly noted, normoactive bowel sounds. Musculoskeletal: No edema, cyanosis, or clubbing. Neuro: Alert, answering all questions appropriately. Cranial nerves grossly intact. Skin: No rashes or petechiae noted. Psych: Normal affect.   LAB RESULTS:  Lab Results  Component Value Date   NA 138 02/19/2018   K 3.6 02/19/2018   CL 100 02/19/2018   CO2 28 02/19/2018   GLUCOSE 95 02/19/2018   BUN 21 02/19/2018   CREATININE 0.85 02/19/2018   CALCIUM 9.1 02/19/2018   PROT 7.1 02/19/2018   ALBUMIN 3.9 02/19/2018   AST 22 02/19/2018   ALT 20 02/19/2018   ALKPHOS 80 02/19/2018   BILITOT 0.4 02/19/2018   GFRNONAA >60 02/19/2018   GFRAA >60  02/19/2018    Lab Results  Component Value Date   WBC 7.1 02/19/2018   NEUTROABS 2.9 02/19/2018   HGB 13.0 02/19/2018   HCT 39.3 02/19/2018   MCV 96.8 02/19/2018   PLT 216 02/19/2018     STUDIES: Ct Chest W Contrast  Result Date: 02/02/2018 CLINICAL DATA:  Rising CEA level.  Ovarian cancer. EXAM: CT CHEST, ABDOMEN, AND PELVIS WITH CONTRAST TECHNIQUE: Multidetector CT imaging of the chest, abdomen and pelvis was performed following the standard protocol during bolus administration of intravenous contrast. CONTRAST:  179mL OMNIPAQUE IOHEXOL 300 MG/ML  SOLN COMPARISON:  06/19/2017 FINDINGS: CT CHEST FINDINGS Cardiovascular: Right Port-A-Cath tip: SVC. Atherosclerotic calcification of the aortic arch and branch vessels. Mildly ectatic ascending aorta at 3.7 cm diameter. Mediastinum/Nodes: Unremarkable Lungs/Pleura: 3 mm calcified granuloma in the lingula. Mild scarring in the left lower lobe. Prior nodularity along the left inferior pulmonary ligament and left lower lobe scarring has essentially resolved. Right middle lobe subpleural nodule anteriorly on image 87/4 is stable at 4 by 3 mm. Musculoskeletal: Lower cervical anterior plate and screw fixator. Thoracic spondylosis. CT ABDOMEN PELVIS FINDINGS Hepatobiliary: Cholecystectomy. Stable extrahepatic biliary dilatation, much of which may be a physiologic response to the cholecystectomy. No significant focal liver lesions identified. Pancreas: Unremarkable Spleen: Unremarkable Adrenals/Urinary Tract: Unremarkable Stomach/Bowel: Prominent stool throughout the colon favors constipation. Vascular/Lymphatic: Aortoiliac atherosclerotic vascular disease. Reproductive: Absent uterus.  Ovaries not well seen. Other: Mild increase in left upper quadrant nodularity compared to previous. For example, one conglomerate nodule measures 1.0 by 1.5 cm on image 69/2, which is increased from the previous nodularity. Prior omentectomy noted. Musculoskeletal: Graft  harvest from the left iliac bone. Degenerative spurring of the SI joints. Posterolateral rod and pedicle screw fixation in the L4-5 level with interbody fusion. Lumbar spondylosis and degenerative disc disease with right foraminal narrowing at L2-3 due to facet spurring. Small ventral hernia contains adipose tissue on image 64/2. IMPRESSION: 1. Mild increase in nodularity along the left upper quadrant omentum compatible with mild progression. 2. Prior nodularity along inferior pulmonary ligament has resolved and is thought to of been benign. Chronic right middle lobe subpleural nodule likewise probably benign although merit surveillance. 3. Other imaging findings of potential clinical significance: Aortoiliac atherosclerotic vascular disease. Prominent stool throughout the colon favors constipation. Small ventral hernia containing adipose tissue. Electronically Signed   By: Van Clines M.D.   On: 02/02/2018 14:35   Ct Abdomen Pelvis W Contrast  Result Date: 02/02/2018 CLINICAL DATA:  Rising CEA level.  Ovarian cancer. EXAM: CT CHEST, ABDOMEN, AND PELVIS WITH CONTRAST TECHNIQUE: Multidetector CT imaging of the chest, abdomen and pelvis was performed following the standard protocol during bolus administration of intravenous contrast. CONTRAST:  122mL OMNIPAQUE IOHEXOL 300 MG/ML  SOLN COMPARISON:  06/19/2017 FINDINGS: CT CHEST FINDINGS Cardiovascular: Right Port-A-Cath tip: SVC. Atherosclerotic calcification of the aortic arch and branch vessels. Mildly ectatic ascending aorta at 3.7 cm diameter. Mediastinum/Nodes: Unremarkable Lungs/Pleura: 3 mm calcified granuloma in the lingula. Mild scarring in the left lower lobe. Prior nodularity along the left inferior pulmonary ligament and left lower lobe scarring has essentially resolved. Right middle lobe subpleural nodule anteriorly on image 87/4 is stable at 4 by 3 mm. Musculoskeletal: Lower cervical anterior plate and screw fixator. Thoracic spondylosis. CT  ABDOMEN PELVIS FINDINGS Hepatobiliary: Cholecystectomy. Stable extrahepatic biliary dilatation, much of which may be a physiologic response to the cholecystectomy. No significant focal liver lesions identified. Pancreas: Unremarkable Spleen: Unremarkable Adrenals/Urinary Tract: Unremarkable Stomach/Bowel: Prominent stool throughout the colon favors constipation. Vascular/Lymphatic: Aortoiliac atherosclerotic  vascular disease. Reproductive: Absent uterus.  Ovaries not well seen. Other: Mild increase in left upper quadrant nodularity compared to previous. For example, one conglomerate nodule measures 1.0 by 1.5 cm on image 69/2, which is increased from the previous nodularity. Prior omentectomy noted. Musculoskeletal: Graft harvest from the left iliac bone. Degenerative spurring of the SI joints. Posterolateral rod and pedicle screw fixation in the L4-5 level with interbody fusion. Lumbar spondylosis and degenerative disc disease with right foraminal narrowing at L2-3 due to facet spurring. Small ventral hernia contains adipose tissue on image 64/2. IMPRESSION: 1. Mild increase in nodularity along the left upper quadrant omentum compatible with mild progression. 2. Prior nodularity along inferior pulmonary ligament has resolved and is thought to of been benign. Chronic right middle lobe subpleural nodule likewise probably benign although merit surveillance. 3. Other imaging findings of potential clinical significance: Aortoiliac atherosclerotic vascular disease. Prominent stool throughout the colon favors constipation. Small ventral hernia containing adipose tissue. Electronically Signed   By: Van Clines M.D.   On: 02/02/2018 14:35    ONCOLOGY HISTORY: Patient underwent debulking surgery at [facility] on November 19, 2016. She received one infusion preoperative infusion of Keytruda on November 11, 2016 as part of a clinical trial.  She initiated adjuvant carboplatinum and Taxol on December 16, 2016.  This  was continued through April 28, 2017 at which time Taxol was discontinued secondary to worsening neuropathy.  Patient then received single agent carboplatinum for additional 2 infusions.  She was then noted to have progression of disease and was switched to Avastin and doxorubicin on August 18, 2017 this was discontinued on November 24, 2017 secondary to declining performance status.  Palliative gemcitabine was initiated on February 20, 2018.   ASSESSMENT: Stage IIIc high-grade serous ovarian carcinoma  PLAN:  1. Stage IIIc high-grade serous ovarian carcinoma: See oncology history as above.  CT scans on February 02, 2018 reviewed independently and report as above with progression of disease.  CA-125 is now 300.0.  After lengthy discussion with the patient, she wishes to continue treatment, but wishes to pursue less toxic regimen.  Plan on using gemcitabine on days 1, 8, and 15 with a 22 off.  Proceed with cycle 1, day 1 of treatment today.  Return to clinic in 1 week for further evaluation and consideration of cycle 1, day 8.   2. Pain: Well controlled. Continue fentanyl patch and oxycodone. Previously discussed with patient about referring back to PCP for pain management at conclusion of therapy. 3. Anxiety: Chronic and unchanged.  Continue Xanax as prescribed. 4.  Peripheral neuropathy: Continue gabapentin as prescribed. Will consider neurology referral in the future.   5.  Anemia: Resolved.   6.  Mouth sores: Resolved.  Continue maintenance Valtrex and Magic mouthwash as needed. 7.  Hand/foot pain: Secondary to Doxil.  Resolved. 8.  Shoulder pain: Continue cortisone injections as needed.  Patient is also indicated she may need rotator cuff surgery in the future.  Okay to proceed from an oncology standpoint, but will have to be coordinated with chemotherapy.  I spent a total of 30 minutes face-to-face with the patient of which greater than 50% of the visit was spent in counseling and coordination of  care as detailed above.   Patient expressed understanding and was in agreement with this plan. She also understands that She can call clinic at any time with any questions, concerns, or complaints.   Cancer Staging Ovarian cancer, unspecified laterality ([place]) Staging form: Ovary, Fallopian Tube,  and Primary Peritoneal Carcinoma, AJCC 8th Edition - Clinical: No stage assigned - Unsigned   Lloyd Huger, MD   02/21/2018 7:33 AM

## 2018-02-19 ENCOUNTER — Inpatient Hospital Stay: Payer: PPO | Attending: Oncology

## 2018-02-19 ENCOUNTER — Inpatient Hospital Stay: Payer: PPO

## 2018-02-19 ENCOUNTER — Other Ambulatory Visit: Payer: Self-pay

## 2018-02-19 ENCOUNTER — Inpatient Hospital Stay (HOSPITAL_BASED_OUTPATIENT_CLINIC_OR_DEPARTMENT_OTHER): Payer: PPO | Admitting: Oncology

## 2018-02-19 VITALS — BP 157/94 | HR 93 | Temp 97.0°F | Ht 62.0 in | Wt 157.1 lb

## 2018-02-19 DIAGNOSIS — R52 Pain, unspecified: Secondary | ICD-10-CM | POA: Diagnosis not present

## 2018-02-19 DIAGNOSIS — M5441 Lumbago with sciatica, right side: Secondary | ICD-10-CM | POA: Insufficient documentation

## 2018-02-19 DIAGNOSIS — G629 Polyneuropathy, unspecified: Secondary | ICD-10-CM

## 2018-02-19 DIAGNOSIS — Z5111 Encounter for antineoplastic chemotherapy: Secondary | ICD-10-CM | POA: Insufficient documentation

## 2018-02-19 DIAGNOSIS — F419 Anxiety disorder, unspecified: Secondary | ICD-10-CM

## 2018-02-19 DIAGNOSIS — C569 Malignant neoplasm of unspecified ovary: Secondary | ICD-10-CM | POA: Insufficient documentation

## 2018-02-19 DIAGNOSIS — D6959 Other secondary thrombocytopenia: Secondary | ICD-10-CM | POA: Insufficient documentation

## 2018-02-19 DIAGNOSIS — Z79899 Other long term (current) drug therapy: Secondary | ICD-10-CM | POA: Diagnosis not present

## 2018-02-19 DIAGNOSIS — C786 Secondary malignant neoplasm of retroperitoneum and peritoneum: Secondary | ICD-10-CM | POA: Diagnosis not present

## 2018-02-19 DIAGNOSIS — Z87891 Personal history of nicotine dependence: Secondary | ICD-10-CM

## 2018-02-19 DIAGNOSIS — D649 Anemia, unspecified: Secondary | ICD-10-CM | POA: Insufficient documentation

## 2018-02-19 LAB — COMPREHENSIVE METABOLIC PANEL
ALK PHOS: 80 U/L (ref 38–126)
ALT: 20 U/L (ref 0–44)
ANION GAP: 10 (ref 5–15)
AST: 22 U/L (ref 15–41)
Albumin: 3.9 g/dL (ref 3.5–5.0)
BILIRUBIN TOTAL: 0.4 mg/dL (ref 0.3–1.2)
BUN: 21 mg/dL (ref 8–23)
CALCIUM: 9.1 mg/dL (ref 8.9–10.3)
CO2: 28 mmol/L (ref 22–32)
Chloride: 100 mmol/L (ref 98–111)
Creatinine, Ser: 0.85 mg/dL (ref 0.44–1.00)
GFR calc Af Amer: >60 mL/min (ref >60)
GFR calc non Af Amer: >60 mL/min (ref >60)
Glucose, Bld: 95 mg/dL (ref 70–99)
Potassium: 3.6 mmol/L (ref 3.5–5.1)
Sodium: 138 mmol/L (ref 135–145)
TOTAL PROTEIN: 7.1 g/dL (ref 6.5–8.1)

## 2018-02-19 LAB — CBC WITH DIFFERENTIAL/PLATELET
Abs Immature Granulocytes: 0.04 10*3/uL (ref 0.00–0.07)
BASOS ABS: 0 10*3/uL (ref 0.0–0.1)
BASOS PCT: 1 %
EOS PCT: 2 %
Eosinophils Absolute: 0.2 10*3/uL (ref 0.0–0.5)
HCT: 39.3 % (ref 36.0–46.0)
HEMOGLOBIN: 13 g/dL (ref 12.0–15.0)
Immature Granulocytes: 1 %
LYMPHS PCT: 41 %
Lymphs Abs: 3 10*3/uL (ref 0.7–4.0)
MCH: 32 pg (ref 26.0–34.0)
MCHC: 33.1 g/dL (ref 30.0–36.0)
MCV: 96.8 fL (ref 80.0–100.0)
Monocytes Absolute: 1 10*3/uL (ref 0.1–1.0)
Monocytes Relative: 14 %
NEUTROS ABS: 2.9 10*3/uL (ref 1.7–7.7)
Neutrophils Relative %: 41 %
PLATELETS: 216 10*3/uL (ref 150–400)
RBC: 4.06 MIL/uL (ref 3.87–5.11)
RDW: 12.3 % (ref 11.5–15.5)
WBC: 7.1 10*3/uL (ref 4.0–10.5)
nRBC: 0 % (ref 0.0–0.2)

## 2018-02-19 LAB — PROTEIN, URINE, RANDOM

## 2018-02-19 MED ORDER — HEPARIN SOD (PORK) LOCK FLUSH 100 UNIT/ML IV SOLN
500.0000 [IU] | Freq: Once | INTRAVENOUS | Status: AC | PRN
  Administered 2018-02-19: 500 [IU]
  Filled 2018-02-19: qty 5

## 2018-02-19 MED ORDER — PROCHLORPERAZINE MALEATE 10 MG PO TABS
10.0000 mg | ORAL_TABLET | Freq: Once | ORAL | Status: AC
  Administered 2018-02-19: 10 mg via ORAL
  Filled 2018-02-19: qty 1

## 2018-02-19 MED ORDER — SODIUM CHLORIDE 0.9 % IV SOLN
1800.0000 mg | Freq: Once | INTRAVENOUS | Status: AC
  Administered 2018-02-19: 1800 mg via INTRAVENOUS
  Filled 2018-02-19: qty 26.3

## 2018-02-19 MED ORDER — SODIUM CHLORIDE 0.9 % IV SOLN
Freq: Once | INTRAVENOUS | Status: AC
  Administered 2018-02-19: 10:00:00 via INTRAVENOUS
  Filled 2018-02-19: qty 250

## 2018-02-19 NOTE — Progress Notes (Signed)
Patient is here today for ovarian cancer. Patient is excited to receive her first treatment. Patient stated that she has had bilateral shoulder pain but received cortisone shot on 02/09/2018 and it had helped.

## 2018-02-20 LAB — CA 125: Cancer Antigen (CA) 125: 300 U/mL — ABNORMAL HIGH (ref 0.0–38.1)

## 2018-02-22 NOTE — Progress Notes (Signed)
[place]  [phone] [phone]  ID: Jessica Burgess OB: [birth year]  MR#: [id number]  ZJI#:[id number]  Patient Care Team: Steele Sizer, MD as PCP - General (Family Medicine) Lloyd Huger, MD as Consulting Physician (Oncology) Mellody Drown, MD as Consulting Physician (Obstetrics and Gynecology) Cathi Roan, [facility] (Pharmacist) Benedetto Goad, RN as Case Manager  CHIEF COMPLAINT: Stage IIIc high-grade serous ovarian carcinoma  INTERVAL HISTORY: Patient returns to clinic today for further evaluation and consideration of cycle 1, day 8 of gemcitabine.  She tolerated her first infusion well only with some mild fatigue and nausea.  She currently feels well and is asymptomatic. Her peripheral neuropathy is unchanged.  She has no other neurologic complaints.  She denies any recent fevers or illnesses.  She has a good appetite and her weight has remained stable. She has no chest pain or shortness of breath. She denies any nausea, vomiting, constipation, or diarrhea.  She denies any abdominal pain or bloating.  She has no urinary complaints.  Patient offers no further specific complaints today.  REVIEW OF SYSTEMS:   Review of Systems  Constitutional: Positive for malaise/fatigue. Negative for fever and weight loss.  HENT: Negative.  Negative for sore throat.   Respiratory: Negative.  Negative for cough and shortness of breath.   Cardiovascular: Negative.  Negative for chest pain and leg swelling.  Gastrointestinal: Negative.  Negative for abdominal pain, blood in stool, constipation, diarrhea, melena, nausea and vomiting.  Genitourinary: Negative.  Negative for dysuria.  Musculoskeletal: Positive for joint pain. Negative for back pain and neck pain.  Skin: Negative.  Negative for itching and rash.  Neurological: Positive for tingling and sensory change. Negative for focal weakness and weakness.  Psychiatric/Behavioral: Negative.  The  patient is not nervous/anxious.     As per HPI. Otherwise, a complete review of systems is negative.  PAST MEDICAL HISTORY: Past Medical History:  Diagnosis Date  . Allergic rhinitis, cause unspecified   . Anxiety state, unspecified   . Arthritis   . Asthma    only when sick   . Backache, unspecified   . Bronchitis    hx of when get sick  . Cancer ([place])    skin cancer , basal cell   . Cancer ([place]) 11/2016   ovarian  . Cervicalgia   . Complication of anesthesia   . Dermatophytosis of nail   . Dysmetabolic syndrome X   . Encounter for long-term (current) use of other medications   . Esophageal reflux   . Insomnia, unspecified   . Leukocytosis, unspecified   . Migraine without aura, without mention of intractable migraine without mention of status migrainosus   . Other and unspecified hyperlipidemia   . Other malaise and fatigue   . Overweight(278.02)   . Personal history of chemotherapy now   ovarian  . PONV (postoperative nausea and vomiting)   . Spinal stenosis in cervical region   . Symptomatic menopausal or female climacteric states   . Unspecified disorder of skin and subcutaneous tissue   . Unspecified vitamin D deficiency     PAST SURGICAL HISTORY: Past Surgical History:  Procedure Laterality Date  . ABDOMINAL HYSTERECTOMY    . ANTERIOR CERVICAL DECOMP/DISCECTOMY FUSION N/A 06/07/2015   Procedure: Cervical three - four and Cervical six- seven anterior cervical decompression with fusion interbody prosthesis plating and bonegraft;  Surgeon: Newman Pies, MD;  Location: [place] [facility];  Service: Neurosurgery;  Laterality: N/A;  C34 and C67 anterior cervical  decompression with fusion interbody prosthesis plating and bonegraft  . BACK SURGERY     x2 Lower   . EVACUATION OF CERVICAL HEMATOMA N/A 06/14/2015   Procedure: EVACUATION OF CERVICAL HEMATOMA;  Surgeon: Newman Pies, MD;  Location: [facility];  Service: Neurosurgery;  Laterality: N/A;  . NECK SURGERY      x3  . TONSILLECTOMY      FAMILY HISTORY: Family History  Problem Relation Age of Onset  . Depression Mother   . Migraines Mother   . Dementia Father   . Diabetes Father   . Hyperlipidemia Father   . Hyperlipidemia Brother   . Hyperlipidemia Brother   . Breast cancer Paternal Aunt        31s    ADVANCED DIRECTIVES (Y/N):  N  HEALTH MAINTENANCE: Social History   Tobacco Use  . Smoking status: Former Smoker    Packs/day: 1.50    Years: 20.00    Pack years: 30.00    Types: Cigarettes    Start date: 03/19/1979    Last attempt to quit: 08/25/1999    Years since quitting: 18.5  . Smokeless tobacco: Never Used  . Tobacco comment: smoking cessation materials not required  Substance Use Topics  . Alcohol use: Not Currently    Alcohol/week: 0.0 standard drinks  . Drug use: No     Colonoscopy:  PAP:  Bone density:  Lipid panel:  No Known Allergies  Current Outpatient Medications  Medication Sig Dispense Refill  . albuterol (PROVENTIL HFA;VENTOLIN HFA) 108 (90 Base) MCG/ACT inhaler Inhale 2 puffs into the lungs every 6 (six) hours as needed for wheezing or shortness of breath. 1 Inhaler 2  . albuterol (PROVENTIL) (2.5 MG/3ML) 0.083% nebulizer solution Take 3 mLs (2.5 mg total) by nebulization every 6 (six) hours as needed for wheezing or shortness of breath. 75 mL 2  . Ascorbic Acid (VITAMIN C PO) Take 1 tablet by mouth daily.     . budesonide-formoterol (SYMBICORT) 160-4.5 MCG/ACT inhaler Inhale 2 puffs into the lungs 2 (two) times daily.    . Cholecalciferol (VITAMIN D-1000 MAX ST) 1000 UNITS tablet Take 1 tablet by mouth 2 (two) times daily.    . Cyanocobalamin (B-12 PO) Take 1 tablet by mouth daily.     . cyclobenzaprine (FLEXERIL) 10 MG tablet Take as directed 30 tablet 1  . DULoxetine (CYMBALTA) 60 MG capsule TAKE 1 CAPSULE(60 MG) BY MOUTH DAILY 90 capsule 0  . estradiol (ESTRACE) 0.5 MG tablet Take 1 tablet (0.5 mg total) by mouth daily. 90 tablet 4  . fentaNYL  (DURAGESIC - DOSED MCG/HR) 50 MCG/HR Place 1 patch (50 mcg total) onto the skin every 3 (three) days. 10 patch 0  . ferrous sulfate (IRON SUPPLEMENT) 325 (65 FE) MG tablet Take 1 tablet by mouth daily.    . fluticasone (FLONASE) 50 MCG/ACT nasal spray Place 2 sprays into both nostrils as needed. 16 g 5  . gabapentin (NEURONTIN) 300 MG capsule Take 1-3 capsules (300-900 mg total) by mouth 3 (three) times daily. 300 mg twice a day and 900 mg at night (Patient taking differently: Take 900 mg by mouth 2 (two) times daily. 300 mg twice a day and 900 mg at night) 540 capsule 1  . Galcanezumab-gnlm (EMGALITY) 120 MG/ML SOSY Inject 120 mg into the skin every 30 (thirty) days. 1 mL 12  . ipratropium (ATROVENT HFA) 17 MCG/ACT inhaler Inhale 2 puffs into the lungs every 6 (six) hours as needed.     Marland Kitchen  lidocaine (XYLOCAINE) 2 % solution Use as directed 15 mLs in the mouth or throat as needed for mouth pain. 100 mL 0  . Lysine 1000 MG TABS Take 1 tablet by mouth daily.     . magic mouthwash w/lidocaine SOLN Take 5 mLs by mouth 3 (three) times daily. 315 mL 0  . Magnesium Oxide 500 MG CAPS Take 500 mg by mouth 2 (two) times daily.     . Multiple Vitamins-Minerals (MULTIVITAMIN PO) Take 1 tablet by mouth daily.     . Omega-3 Fatty Acids (FISH OIL PO) Take 1 capsule by mouth 2 (two) times daily.     Marland Kitchen oxyCODONE-acetaminophen (PERCOCET) 10-325 MG tablet Take 1 tablet by mouth every 8 (eight) hours as needed for pain. 90 tablet 0  . potassium gluconate 595 MG TABS tablet Take 595 mg by mouth daily.    . SUMAtriptan (IMITREX) 100 MG tablet May repeat in 2 hours if headache persists or recurs. 27 tablet 0  . temazepam (RESTORIL) 15 MG capsule Take 1 capsule (15 mg total) by mouth at bedtime as needed for sleep. 30 capsule 2  . triamcinolone (KENALOG) 0.1 % paste Use as directed 1 application in the mouth or throat 2 (two) times daily. 5 g 2  . urea (CARMOL) 10 % cream Apply topically as needed. 410 g 1  . valACYclovir  (VALTREX) 1000 MG tablet TAKE 1 TABLET(1000 MG) BY MOUTH THREE TIMES DAILY FOR 7 DAYS 21 tablet 0   No current facility-administered medications for this visit.    Facility-Administered Medications Ordered in Other Visits  Medication Dose Route Frequency Provider Last Rate Last Dose  . 0.9 %  sodium chloride infusion   Intravenous Once Lloyd Huger, MD      . 0.9 %  sodium chloride infusion   Intravenous Once Lloyd Huger, MD      . dexamethasone (DECADRON) 20 mg in sodium chloride 0.9 % 50 mL IVPB  20 mg Intravenous Once Lloyd Huger, MD      . dexamethasone (DECADRON) injection 10 mg  10 mg Intravenous Once Lloyd Huger, MD      . sodium chloride flush (NS) 0.9 % injection 10 mL  10 mL Intravenous PRN Lloyd Huger, MD   10 mL at 06/16/17 0854    OBJECTIVE: There were no vitals filed for this visit.   There is no height or weight on file to calculate BMI.    ECOG FS:0 - Asymptomatic  General: Well-developed, well-nourished, no acute distress. Eyes: Pink conjunctiva, anicteric sclera. HEENT: Normocephalic, moist mucous membranes. Lungs: Clear to auscultation bilaterally. Heart: Regular rate and rhythm. No rubs, murmurs, or gallops. Abdomen: Soft, nontender, nondistended. No organomegaly noted, normoactive bowel sounds. Musculoskeletal: No edema, cyanosis, or clubbing. Neuro: Alert, answering all questions appropriately. Cranial nerves grossly intact. Skin: No rashes or petechiae noted. Psych: Normal affect.   LAB RESULTS:  Lab Results  Component Value Date   NA 137 02/26/2018   K 4.0 02/26/2018   CL 101 02/26/2018   CO2 29 02/26/2018   GLUCOSE 125 (H) 02/26/2018   BUN 15 02/26/2018   CREATININE 0.88 02/26/2018   CALCIUM 8.8 (L) 02/26/2018   PROT 7.0 02/26/2018   ALBUMIN 3.8 02/26/2018   AST 31 02/26/2018   ALT 27 02/26/2018   ALKPHOS 74 02/26/2018   BILITOT 0.5 02/26/2018   GFRNONAA >60 02/26/2018   GFRAA >60 02/26/2018    Lab  Results  Component Value Date  WBC 3.3 (L) 02/26/2018   NEUTROABS 1.5 (L) 02/26/2018   HGB 12.1 02/26/2018   HCT 36.3 02/26/2018   MCV 95.3 02/26/2018   PLT 132 (L) 02/26/2018     STUDIES: Ct Chest W Contrast  Result Date: 02/02/2018 CLINICAL DATA:  Rising CEA level.  Ovarian cancer. EXAM: CT CHEST, ABDOMEN, AND PELVIS WITH CONTRAST TECHNIQUE: Multidetector CT imaging of the chest, abdomen and pelvis was performed following the standard protocol during bolus administration of intravenous contrast. CONTRAST:  139mL OMNIPAQUE IOHEXOL 300 MG/ML  SOLN COMPARISON:  06/19/2017 FINDINGS: CT CHEST FINDINGS Cardiovascular: Right Port-A-Cath tip: SVC. Atherosclerotic calcification of the aortic arch and branch vessels. Mildly ectatic ascending aorta at 3.7 cm diameter. Mediastinum/Nodes: Unremarkable Lungs/Pleura: 3 mm calcified granuloma in the lingula. Mild scarring in the left lower lobe. Prior nodularity along the left inferior pulmonary ligament and left lower lobe scarring has essentially resolved. Right middle lobe subpleural nodule anteriorly on image 87/4 is stable at 4 by 3 mm. Musculoskeletal: Lower cervical anterior plate and screw fixator. Thoracic spondylosis. CT ABDOMEN PELVIS FINDINGS Hepatobiliary: Cholecystectomy. Stable extrahepatic biliary dilatation, much of which may be a physiologic response to the cholecystectomy. No significant focal liver lesions identified. Pancreas: Unremarkable Spleen: Unremarkable Adrenals/Urinary Tract: Unremarkable Stomach/Bowel: Prominent stool throughout the colon favors constipation. Vascular/Lymphatic: Aortoiliac atherosclerotic vascular disease. Reproductive: Absent uterus.  Ovaries not well seen. Other: Mild increase in left upper quadrant nodularity compared to previous. For example, one conglomerate nodule measures 1.0 by 1.5 cm on image 69/2, which is increased from the previous nodularity. Prior omentectomy noted. Musculoskeletal: Graft harvest from  the left iliac bone. Degenerative spurring of the SI joints. Posterolateral rod and pedicle screw fixation in the L4-5 level with interbody fusion. Lumbar spondylosis and degenerative disc disease with right foraminal narrowing at L2-3 due to facet spurring. Small ventral hernia contains adipose tissue on image 64/2. IMPRESSION: 1. Mild increase in nodularity along the left upper quadrant omentum compatible with mild progression. 2. Prior nodularity along inferior pulmonary ligament has resolved and is thought to of been benign. Chronic right middle lobe subpleural nodule likewise probably benign although merit surveillance. 3. Other imaging findings of potential clinical significance: Aortoiliac atherosclerotic vascular disease. Prominent stool throughout the colon favors constipation. Small ventral hernia containing adipose tissue. Electronically Signed   By: Van Clines M.D.   On: 02/02/2018 14:35   Ct Abdomen Pelvis W Contrast  Result Date: 02/02/2018 CLINICAL DATA:  Rising CEA level.  Ovarian cancer. EXAM: CT CHEST, ABDOMEN, AND PELVIS WITH CONTRAST TECHNIQUE: Multidetector CT imaging of the chest, abdomen and pelvis was performed following the standard protocol during bolus administration of intravenous contrast. CONTRAST:  154mL OMNIPAQUE IOHEXOL 300 MG/ML  SOLN COMPARISON:  06/19/2017 FINDINGS: CT CHEST FINDINGS Cardiovascular: Right Port-A-Cath tip: SVC. Atherosclerotic calcification of the aortic arch and branch vessels. Mildly ectatic ascending aorta at 3.7 cm diameter. Mediastinum/Nodes: Unremarkable Lungs/Pleura: 3 mm calcified granuloma in the lingula. Mild scarring in the left lower lobe. Prior nodularity along the left inferior pulmonary ligament and left lower lobe scarring has essentially resolved. Right middle lobe subpleural nodule anteriorly on image 87/4 is stable at 4 by 3 mm. Musculoskeletal: Lower cervical anterior plate and screw fixator. Thoracic spondylosis. CT ABDOMEN PELVIS  FINDINGS Hepatobiliary: Cholecystectomy. Stable extrahepatic biliary dilatation, much of which may be a physiologic response to the cholecystectomy. No significant focal liver lesions identified. Pancreas: Unremarkable Spleen: Unremarkable Adrenals/Urinary Tract: Unremarkable Stomach/Bowel: Prominent stool throughout the colon favors constipation. Vascular/Lymphatic: Aortoiliac atherosclerotic vascular disease.  Reproductive: Absent uterus.  Ovaries not well seen. Other: Mild increase in left upper quadrant nodularity compared to previous. For example, one conglomerate nodule measures 1.0 by 1.5 cm on image 69/2, which is increased from the previous nodularity. Prior omentectomy noted. Musculoskeletal: Graft harvest from the left iliac bone. Degenerative spurring of the SI joints. Posterolateral rod and pedicle screw fixation in the L4-5 level with interbody fusion. Lumbar spondylosis and degenerative disc disease with right foraminal narrowing at L2-3 due to facet spurring. Small ventral hernia contains adipose tissue on image 64/2. IMPRESSION: 1. Mild increase in nodularity along the left upper quadrant omentum compatible with mild progression. 2. Prior nodularity along inferior pulmonary ligament has resolved and is thought to of been benign. Chronic right middle lobe subpleural nodule likewise probably benign although merit surveillance. 3. Other imaging findings of potential clinical significance: Aortoiliac atherosclerotic vascular disease. Prominent stool throughout the colon favors constipation. Small ventral hernia containing adipose tissue. Electronically Signed   By: Van Clines M.D.   On: 02/02/2018 14:35    ONCOLOGY HISTORY: Patient underwent debulking surgery at [facility] on November 19, 2016. She received one infusion preoperative infusion of Keytruda on November 11, 2016 as part of a clinical trial.  She initiated adjuvant carboplatinum and Taxol on December 16, 2016.  This was continued  through April 28, 2017 at which time Taxol was discontinued secondary to worsening neuropathy.  Patient then received single agent carboplatinum for additional 2 infusions.  She was then noted to have progression of disease and was switched to Avastin and doxorubicin on August 18, 2017 this was discontinued on November 24, 2017 secondary to declining performance status.  Palliative gemcitabine was initiated on February 20, 2018.   ASSESSMENT: Stage IIIc high-grade serous ovarian carcinoma  PLAN:  1. Stage IIIc high-grade serous ovarian carcinoma: See oncology history as above.  CT scans on February 02, 2018 reviewed independently with progression of disease.  CA-125 is now 301.0.  After lengthy discussion with the patient, she wishes to continue treatment, but wishes to pursue less toxic regimen.  Plan on using gemcitabine on days 1, 8, and 15 with a 22 off.  Proceed with cycle 1, day 8 of gemcitabine today.  Return to clinic in 1 week for further evaluation and consideration of cycle 1, day 15.   2. Pain: Well controlled. Continue fentanyl patch and oxycodone.  3. Anxiety: Chronic and unchanged.  Continue Xanax as prescribed. 4.  Peripheral neuropathy: Continue gabapentin as prescribed. Will consider neurology referral in the future.   5.  Anemia: Resolved.   6.  Mouth sores: Resolved.  Continue maintenance Valtrex and Magic mouthwash as needed. 7.  Shoulder pain: Continue cortisone injections as needed.  Patient is also indicated she may need rotator cuff surgery in the future.  Okay to proceed from an oncology standpoint, but will have to be coordinated with chemotherapy. 8.  Thrombocytopenia: Mild, secondary to chemotherapy.  Monitor.   Patient expressed understanding and was in agreement with this plan. She also understands that She can call clinic at any time with any questions, concerns, or complaints.   Cancer Staging Ovarian cancer, unspecified laterality ([place]) Staging form: Ovary,  Fallopian Tube, and Primary Peritoneal Carcinoma, AJCC 8th Edition - Clinical: No stage assigned - Unsigned   Lloyd Huger, MD   02/27/2018 2:15 PM

## 2018-02-26 ENCOUNTER — Inpatient Hospital Stay (HOSPITAL_BASED_OUTPATIENT_CLINIC_OR_DEPARTMENT_OTHER): Payer: PPO | Admitting: Oncology

## 2018-02-26 ENCOUNTER — Inpatient Hospital Stay: Payer: PPO

## 2018-02-26 ENCOUNTER — Other Ambulatory Visit: Payer: Self-pay

## 2018-02-26 DIAGNOSIS — D6959 Other secondary thrombocytopenia: Secondary | ICD-10-CM

## 2018-02-26 DIAGNOSIS — G8929 Other chronic pain: Secondary | ICD-10-CM | POA: Diagnosis not present

## 2018-02-26 DIAGNOSIS — M542 Cervicalgia: Secondary | ICD-10-CM

## 2018-02-26 DIAGNOSIS — C569 Malignant neoplasm of unspecified ovary: Secondary | ICD-10-CM

## 2018-02-26 DIAGNOSIS — C787 Secondary malignant neoplasm of liver and intrahepatic bile duct: Secondary | ICD-10-CM

## 2018-02-26 DIAGNOSIS — M5441 Lumbago with sciatica, right side: Secondary | ICD-10-CM | POA: Diagnosis not present

## 2018-02-26 DIAGNOSIS — Z5111 Encounter for antineoplastic chemotherapy: Secondary | ICD-10-CM | POA: Diagnosis not present

## 2018-02-26 DIAGNOSIS — C7951 Secondary malignant neoplasm of bone: Secondary | ICD-10-CM

## 2018-02-26 LAB — COMPREHENSIVE METABOLIC PANEL
ALT: 27 U/L (ref 0–44)
ANION GAP: 7 (ref 5–15)
AST: 31 U/L (ref 15–41)
Albumin: 3.8 g/dL (ref 3.5–5.0)
Alkaline Phosphatase: 74 U/L (ref 38–126)
BUN: 15 mg/dL (ref 8–23)
CO2: 29 mmol/L (ref 22–32)
Calcium: 8.8 mg/dL — ABNORMAL LOW (ref 8.9–10.3)
Chloride: 101 mmol/L (ref 98–111)
Creatinine, Ser: 0.88 mg/dL (ref 0.44–1.00)
GFR calc Af Amer: >60 mL/min (ref >60)
GFR calc non Af Amer: >60 mL/min (ref >60)
GLUCOSE: 125 mg/dL — AB (ref 70–99)
Potassium: 4 mmol/L (ref 3.5–5.1)
Sodium: 137 mmol/L (ref 135–145)
TOTAL PROTEIN: 7 g/dL (ref 6.5–8.1)
Total Bilirubin: 0.5 mg/dL (ref 0.3–1.2)

## 2018-02-26 LAB — CBC WITH DIFFERENTIAL/PLATELET
Abs Immature Granulocytes: 0.01 10*3/uL (ref 0.00–0.07)
Basophils Absolute: 0 10*3/uL (ref 0.0–0.1)
Basophils Relative: 0 %
EOS PCT: 1 %
Eosinophils Absolute: 0 10*3/uL (ref 0.0–0.5)
HCT: 36.3 % (ref 36.0–46.0)
Hemoglobin: 12.1 g/dL (ref 12.0–15.0)
Immature Granulocytes: 0 %
Lymphocytes Relative: 49 %
Lymphs Abs: 1.6 10*3/uL (ref 0.7–4.0)
MCH: 31.8 pg (ref 26.0–34.0)
MCHC: 33.3 g/dL (ref 30.0–36.0)
MCV: 95.3 fL (ref 80.0–100.0)
Monocytes Absolute: 0.2 10*3/uL (ref 0.1–1.0)
Monocytes Relative: 6 %
Neutro Abs: 1.5 10*3/uL — ABNORMAL LOW (ref 1.7–7.7)
Neutrophils Relative %: 44 %
Platelets: 132 10*3/uL — ABNORMAL LOW (ref 150–400)
RBC: 3.81 MIL/uL — ABNORMAL LOW (ref 3.87–5.11)
RDW: 11.9 % (ref 11.5–15.5)
WBC: 3.3 10*3/uL — ABNORMAL LOW (ref 4.0–10.5)
nRBC: 0 % (ref 0.0–0.2)

## 2018-02-26 MED ORDER — CYCLOBENZAPRINE HCL 10 MG PO TABS: ORAL_TABLET | ORAL | 1 refills | Status: DC

## 2018-02-26 MED ORDER — SODIUM CHLORIDE 0.9% FLUSH
10.0000 mL | Freq: Once | INTRAVENOUS | Status: AC
  Administered 2018-02-26: 10 mL via INTRAVENOUS
  Filled 2018-02-26: qty 10

## 2018-02-26 MED ORDER — OXYCODONE-ACETAMINOPHEN 10-325 MG PO TABS: 1.0000 | ORAL_TABLET | Freq: Three times a day (TID) | ORAL | 0 refills | Status: DC | PRN

## 2018-02-26 MED ORDER — SODIUM CHLORIDE 0.9 % IV SOLN
1800.0000 mg | Freq: Once | INTRAVENOUS | Status: AC
  Administered 2018-02-26: 1800 mg via INTRAVENOUS
  Filled 2018-02-26: qty 47.34

## 2018-02-26 MED ORDER — HEPARIN SOD (PORK) LOCK FLUSH 100 UNIT/ML IV SOLN
500.0000 [IU] | Freq: Once | INTRAVENOUS | Status: DC | PRN
  Filled 2018-02-26: qty 5

## 2018-02-26 MED ORDER — HEPARIN SOD (PORK) LOCK FLUSH 100 UNIT/ML IV SOLN
500.0000 [IU] | Freq: Once | INTRAVENOUS | Status: AC
  Administered 2018-02-26: 500 [IU] via INTRAVENOUS

## 2018-02-26 MED ORDER — PROCHLORPERAZINE MALEATE 10 MG PO TABS
10.0000 mg | ORAL_TABLET | Freq: Once | ORAL | Status: AC
  Administered 2018-02-26: 10 mg via ORAL
  Filled 2018-02-26: qty 1

## 2018-02-26 MED ORDER — SODIUM CHLORIDE 0.9 % IV SOLN
Freq: Once | INTRAVENOUS | Status: AC
  Administered 2018-02-26: 11:00:00 via INTRAVENOUS
  Filled 2018-02-26: qty 250

## 2018-02-26 NOTE — Progress Notes (Signed)
Per Dr. Grayland Ormond okay to proceed with treatment with B/P 157/94.

## 2018-02-26 NOTE — Progress Notes (Signed)
Patient is here today to follow up on her ovarian cancer. Patient stated that she had been having nausea but no vomiting. She had been taking her nausea medicine and it had helped. Patient also stated that she had been having aches and pain in her joints and takes her Oxycodone-Acetaminophen and helps her some with her pain. Patient is requesting a refill on her cyclobenzaprine and Oxycodone-Acetaminophen since she stated that the holidays are coming and is afraid to run out when we are not here. Patient denied fever, chills, constipation, diarrhea and loss of appetite.

## 2018-02-27 LAB — CA 125: CANCER ANTIGEN (CA) 125: 301 U/mL — AB (ref 0.0–38.1)

## 2018-03-01 NOTE — Progress Notes (Signed)
[place]  [phone] [phone]  ID: Jessica Burgess OB: 24-May-1955  MR#: [id number]  QMG#:[id number]  Patient Care Team: Steele Sizer, MD as PCP - General (Family Medicine) Lloyd Huger, MD as Consulting Physician (Oncology) Mellody Drown, MD as Consulting Physician (Obstetrics and Gynecology) Cathi Roan, [facility] (Pharmacist) Benedetto Goad, RN as Case Manager  CHIEF COMPLAINT: Stage IIIc high-grade serous ovarian carcinoma  INTERVAL HISTORY: Patient returns to clinic today for further evaluation and consideration of cycle 1, day 15 of gemcitabine.  She is tolerating her treatments well without significant side effects. She currently feels well and is asymptomatic. Her peripheral neuropathy is unchanged.  She has no other neurologic complaints.  She denies any recent fevers or illnesses.  She has a good appetite and her weight has remained stable. She has no chest pain or shortness of breath. She denies any nausea, vomiting, constipation, or diarrhea.  She denies any abdominal pain or bloating.  She has no urinary complaints.  She denies any easy bleeding or bruising.  Patient offers no specific complaints today.    REVIEW OF SYSTEMS:   Review of Systems  Constitutional: Positive for malaise/fatigue. Negative for fever and weight loss.  HENT: Negative.  Negative for sore throat.   Respiratory: Negative.  Negative for cough and shortness of breath.   Cardiovascular: Negative.  Negative for chest pain and leg swelling.  Gastrointestinal: Negative.  Negative for abdominal pain, blood in stool, constipation, diarrhea, melena, nausea and vomiting.  Genitourinary: Negative.  Negative for dysuria.  Musculoskeletal: Positive for joint pain. Negative for back pain and neck pain.  Skin: Negative.  Negative for itching and rash.  Neurological: Positive for tingling and sensory change. Negative for focal weakness and weakness.    Psychiatric/Behavioral: Negative.  The patient is not nervous/anxious.     As per HPI. Otherwise, a complete review of systems is negative.  PAST MEDICAL HISTORY: Past Medical History:  Diagnosis Date  . Allergic rhinitis, cause unspecified   . Anxiety state, unspecified   . Arthritis   . Asthma    only when sick   . Backache, unspecified   . Bronchitis    hx of when get sick  . Cancer ([place])    skin cancer , basal cell   . Cancer ([place]) 11/2016   ovarian  . Cervicalgia   . Complication of anesthesia   . Dermatophytosis of nail   . Dysmetabolic syndrome X   . Encounter for long-term (current) use of other medications   . Esophageal reflux   . Insomnia, unspecified   . Leukocytosis, unspecified   . Migraine without aura, without mention of intractable migraine without mention of status migrainosus   . Other and unspecified hyperlipidemia   . Other malaise and fatigue   . Overweight(278.02)   . Personal history of chemotherapy now   ovarian  . PONV (postoperative nausea and vomiting)   . Spinal stenosis in cervical region   . Symptomatic menopausal or female climacteric states   . Unspecified disorder of skin and subcutaneous tissue   . Unspecified vitamin D deficiency     PAST SURGICAL HISTORY: Past Surgical History:  Procedure Laterality Date  . ABDOMINAL HYSTERECTOMY    . ANTERIOR CERVICAL DECOMP/DISCECTOMY FUSION N/A 06/07/2015   Procedure: Cervical three - four and Cervical six- seven anterior cervical decompression with fusion interbody prosthesis plating and bonegraft;  Surgeon: Newman Pies, MD;  Location: [facility];  Service: Neurosurgery;  Laterality: N/A;  C34 and C67 anterior cervical decompression with fusion interbody prosthesis plating and bonegraft  . BACK SURGERY     x2 Lower   . EVACUATION OF CERVICAL HEMATOMA N/A 06/14/2015   Procedure: EVACUATION OF CERVICAL HEMATOMA;  Surgeon: Newman Pies, MD;  Location: [place];  Service:  Neurosurgery;  Laterality: N/A;  . NECK SURGERY     x3  . TONSILLECTOMY      FAMILY HISTORY: Family History  Problem Relation Age of Onset  . Depression Mother   . Migraines Mother   . Dementia Father   . Diabetes Father   . Hyperlipidemia Father   . Hyperlipidemia Brother   . Hyperlipidemia Brother   . Breast cancer Paternal Aunt        64s    ADVANCED DIRECTIVES (Y/N):  N  HEALTH MAINTENANCE: Social History   Tobacco Use  . Smoking status: Former Smoker    Packs/day: 1.50    Years: 20.00    Pack years: 30.00    Types: Cigarettes    Start date: 03/19/1979    Last attempt to quit: 08/25/1999    Years since quitting: 18.5  . Smokeless tobacco: Never Used  . Tobacco comment: smoking cessation materials not required  Substance Use Topics  . Alcohol use: Not Currently    Alcohol/week: 0.0 standard drinks  . Drug use: No     Colonoscopy:  PAP:  Bone density:  Lipid panel:  No Known Allergies  Current Outpatient Medications  Medication Sig Dispense Refill  . albuterol (PROVENTIL HFA;VENTOLIN HFA) 108 (90 Base) MCG/ACT inhaler Inhale 2 puffs into the lungs every 6 (six) hours as needed for wheezing or shortness of breath. 1 Inhaler 2  . albuterol (PROVENTIL) (2.5 MG/3ML) 0.083% nebulizer solution Take 3 mLs (2.5 mg total) by nebulization every 6 (six) hours as needed for wheezing or shortness of breath. 75 mL 2  . Ascorbic Acid (VITAMIN C PO) Take 1 tablet by mouth daily.     . budesonide-formoterol (SYMBICORT) 160-4.5 MCG/ACT inhaler Inhale 2 puffs into the lungs 2 (two) times daily.    . Cholecalciferol (VITAMIN D-1000 MAX ST) 1000 UNITS tablet Take 1 tablet by mouth 2 (two) times daily.    . Cyanocobalamin (B-12 PO) Take 1 tablet by mouth daily.     . cyclobenzaprine (FLEXERIL) 10 MG tablet Take as directed 30 tablet 1  . DULoxetine (CYMBALTA) 60 MG capsule TAKE 1 CAPSULE(60 MG) BY MOUTH DAILY 90 capsule 0  . estradiol (ESTRACE) 0.5 MG tablet Take 1 tablet (0.5 mg  total) by mouth daily. 90 tablet 4  . fentaNYL (DURAGESIC - DOSED MCG/HR) 50 MCG/HR Place 1 patch (50 mcg total) onto the skin every 3 (three) days. 10 patch 0  . ferrous sulfate (IRON SUPPLEMENT) 325 (65 FE) MG tablet Take 1 tablet by mouth daily.    . fluticasone (FLONASE) 50 MCG/ACT nasal spray Place 2 sprays into both nostrils as needed. 16 g 5  . gabapentin (NEURONTIN) 300 MG capsule Take 1-3 capsules (300-900 mg total) by mouth 3 (three) times daily. 300 mg twice a day and 900 mg at night (Patient taking differently: Take 900 mg by mouth 2 (two) times daily. 300 mg twice a day and 900 mg at night) 540 capsule 1  . Galcanezumab-gnlm (EMGALITY) 120 MG/ML SOSY Inject 120 mg into the skin every 30 (thirty) days. 1 mL 12  . ipratropium (ATROVENT HFA) 17 MCG/ACT inhaler Inhale 2 puffs into the lungs every 6 (six) hours as  needed.     . lidocaine (XYLOCAINE) 2 % solution Use as directed 15 mLs in the mouth or throat as needed for mouth pain. 100 mL 0  . Lysine 1000 MG TABS Take 1 tablet by mouth daily.     . magic mouthwash w/lidocaine SOLN Take 5 mLs by mouth 3 (three) times daily. 315 mL 0  . Magnesium Oxide 500 MG CAPS Take 500 mg by mouth 2 (two) times daily.     . Multiple Vitamins-Minerals (MULTIVITAMIN PO) Take 1 tablet by mouth daily.     . Omega-3 Fatty Acids (FISH OIL PO) Take 1 capsule by mouth 2 (two) times daily.     Marland Kitchen oxyCODONE-acetaminophen (PERCOCET) 10-325 MG tablet Take 1 tablet by mouth every 8 (eight) hours as needed for pain. 90 tablet 0  . potassium gluconate 595 MG TABS tablet Take 595 mg by mouth daily.    . SUMAtriptan (IMITREX) 100 MG tablet May repeat in 2 hours if headache persists or recurs. 27 tablet 0  . temazepam (RESTORIL) 15 MG capsule Take 1 capsule (15 mg total) by mouth at bedtime as needed for sleep. 30 capsule 2  . triamcinolone (KENALOG) 0.1 % paste Use as directed 1 application in the mouth or throat 2 (two) times daily. 5 g 2  . urea (CARMOL) 10 % cream  Apply topically as needed. 410 g 1  . valACYclovir (VALTREX) 1000 MG tablet TAKE 1 TABLET(1000 MG) BY MOUTH THREE TIMES DAILY FOR 7 DAYS 21 tablet 0   No current facility-administered medications for this visit.    Facility-Administered Medications Ordered in Other Visits  Medication Dose Route Frequency Provider Last Rate Last Dose  . 0.9 %  sodium chloride infusion   Intravenous Once Lloyd Huger, MD      . 0.9 %  sodium chloride infusion   Intravenous Once Lloyd Huger, MD      . dexamethasone (DECADRON) 20 mg in sodium chloride 0.9 % 50 mL IVPB  20 mg Intravenous Once Lloyd Huger, MD      . dexamethasone (DECADRON) injection 10 mg  10 mg Intravenous Once Lloyd Huger, MD      . sodium chloride flush (NS) 0.9 % injection 10 mL  10 mL Intravenous PRN Lloyd Huger, MD   10 mL at 06/16/17 0854    OBJECTIVE: Vitals:   03/05/18 0856  BP: (!) 148/92  Pulse: 87  Temp: (!) 97.2 F (36.2 C)     Body mass index is 28.73 kg/m.    ECOG FS:0 - Asymptomatic  General: Well-developed, well-nourished, no acute distress. Eyes: Pink conjunctiva, anicteric sclera. HEENT: Normocephalic, moist mucous membranes. Lungs: Clear to auscultation bilaterally. Heart: Regular rate and rhythm. No rubs, murmurs, or gallops. Abdomen: Soft, nontender, nondistended. No organomegaly noted, normoactive bowel sounds. Musculoskeletal: No edema, cyanosis, or clubbing. Neuro: Alert, answering all questions appropriately. Cranial nerves grossly intact. Skin: No rashes or petechiae noted. Psych: Normal affect.   LAB RESULTS:  Lab Results  Component Value Date   NA 137 03/05/2018   K 4.1 03/05/2018   CL 99 03/05/2018   CO2 28 03/05/2018   GLUCOSE 96 03/05/2018   BUN 17 03/05/2018   CREATININE 0.81 03/05/2018   CALCIUM 9.0 03/05/2018   PROT 7.2 03/05/2018   ALBUMIN 3.9 03/05/2018   AST 51 (H) 03/05/2018   ALT 53 (H) 03/05/2018   ALKPHOS 87 03/05/2018   BILITOT 0.4  03/05/2018   GFRNONAA >60 03/05/2018  GFRAA >60 03/05/2018    Lab Results  Component Value Date   WBC 3.6 (L) 03/05/2018   NEUTROABS 0.7 (L) 03/05/2018   HGB 11.9 (L) 03/05/2018   HCT 34.7 (L) 03/05/2018   MCV 93.5 03/05/2018   PLT 79 (L) 03/05/2018     STUDIES: No results found.  ONCOLOGY HISTORY: Patient underwent debulking [facility] on November 19, 2016. She received one infusion preoperative infusion of Keytruda on November 11, 2016 as part of a clinical trial.  She initiated adjuvant carboplatinum and Taxol on December 16, 2016.  This was continued through April 28, 2017 at which time Taxol was discontinued secondary to worsening neuropathy.  Patient then received single agent carboplatinum for additional 2 infusions.  She was then noted to have progression of disease and was switched to Avastin and doxorubicin on August 18, 2017 this was discontinued on November 24, 2017 secondary to declining performance status.  Palliative gemcitabine was initiated on February 20, 2018.   ASSESSMENT: Stage IIIc high-grade serous ovarian carcinoma  PLAN:  1. Stage IIIc high-grade serous ovarian carcinoma: See oncology history as above.  CT scans on February 02, 2018 reviewed independently with progression of disease.  CA-125 is decreasing and is now 201.0.  After lengthy discussion with the patient, she wishes to continue treatment, but wishes to pursue less toxic regimen.  Plan on using gemcitabine on days 1, 8, and 15 with a 22 off.  Because of patient's thrombocytopenia and neutropenia, will dose reduce gemcitabine 10%.  Proceed with cycle 1, day 15 of gemcitabine today.  Return to clinic in 2 weeks for further evaluation and consideration of cycle 2, day 1. 2. Pain: Well controlled. Continue fentanyl patch and oxycodone.  3. Anxiety: Chronic and unchanged.  Continue Xanax as prescribed. 4.  Peripheral neuropathy: Continue gabapentin as prescribed. Will consider neurology referral  in the future.   5.  Anemia: Hemoglobin only mildly decreased. 6.  Mouth sores: Resolved.  Continue maintenance Valtrex and Magic mouthwash as needed. 7.  Shoulder pain: Continue cortisone injections as needed.  Patient is also indicated she may need rotator cuff surgery in the future.  Okay to proceed from an oncology standpoint, but will have to be coordinated with chemotherapy. 8.  Thrombocytopenia: Significantly decreased secondary to chemotherapy.  Dose reduce gemcitabine and proceed with treatment as above. 9.  Neutropenia: Monitor.  Dose reduced gemcitabine as above and proceed with treatment as above.   Patient expressed understanding and was in agreement with this plan. She also understands that She can call clinic at any time with any questions, concerns, or complaints.   Cancer Staging Ovarian cancer, unspecified laterality ([place]) Staging form: Ovary, Fallopian Tube, and Primary Peritoneal Carcinoma, AJCC 8th Edition - Clinical: No stage assigned - Unsigned   Lloyd Huger, MD   03/06/2018 3:25 PM

## 2018-03-04 ENCOUNTER — Ambulatory Visit: Payer: PPO | Admitting: Family Medicine

## 2018-03-05 ENCOUNTER — Other Ambulatory Visit: Payer: Self-pay

## 2018-03-05 ENCOUNTER — Inpatient Hospital Stay: Payer: PPO

## 2018-03-05 ENCOUNTER — Inpatient Hospital Stay (HOSPITAL_BASED_OUTPATIENT_CLINIC_OR_DEPARTMENT_OTHER): Payer: PPO | Admitting: Oncology

## 2018-03-05 VITALS — BP 148/92 | HR 87 | Temp 97.2°F | Wt 157.1 lb

## 2018-03-05 DIAGNOSIS — D6959 Other secondary thrombocytopenia: Secondary | ICD-10-CM

## 2018-03-05 DIAGNOSIS — Z5111 Encounter for antineoplastic chemotherapy: Secondary | ICD-10-CM | POA: Diagnosis not present

## 2018-03-05 DIAGNOSIS — F419 Anxiety disorder, unspecified: Secondary | ICD-10-CM

## 2018-03-05 DIAGNOSIS — Z87891 Personal history of nicotine dependence: Secondary | ICD-10-CM

## 2018-03-05 DIAGNOSIS — C569 Malignant neoplasm of unspecified ovary: Secondary | ICD-10-CM

## 2018-03-05 DIAGNOSIS — D649 Anemia, unspecified: Secondary | ICD-10-CM

## 2018-03-05 LAB — COMPREHENSIVE METABOLIC PANEL
ALT: 53 U/L — ABNORMAL HIGH (ref 0–44)
AST: 51 U/L — AB (ref 15–41)
Albumin: 3.9 g/dL (ref 3.5–5.0)
Alkaline Phosphatase: 87 U/L (ref 38–126)
Anion gap: 10 (ref 5–15)
BUN: 17 mg/dL (ref 8–23)
CO2: 28 mmol/L (ref 22–32)
CREATININE: 0.81 mg/dL (ref 0.44–1.00)
Calcium: 9 mg/dL (ref 8.9–10.3)
Chloride: 99 mmol/L (ref 98–111)
GFR calc Af Amer: >60 mL/min (ref >60)
GFR calc non Af Amer: >60 mL/min (ref >60)
Glucose, Bld: 96 mg/dL (ref 70–99)
Potassium: 4.1 mmol/L (ref 3.5–5.1)
Sodium: 137 mmol/L (ref 135–145)
Total Bilirubin: 0.4 mg/dL (ref 0.3–1.2)
Total Protein: 7.2 g/dL (ref 6.5–8.1)

## 2018-03-05 LAB — CBC WITH DIFFERENTIAL/PLATELET
Abs Immature Granulocytes: 0.03 10*3/uL (ref 0.00–0.07)
Basophils Absolute: 0 10*3/uL (ref 0.0–0.1)
Basophils Relative: 0 %
EOS PCT: 1 %
Eosinophils Absolute: 0 10*3/uL (ref 0.0–0.5)
HCT: 34.7 % — ABNORMAL LOW (ref 36.0–46.0)
HEMOGLOBIN: 11.9 g/dL — AB (ref 12.0–15.0)
Immature Granulocytes: 1 %
Lymphocytes Relative: 74 %
Lymphs Abs: 2.6 10*3/uL (ref 0.7–4.0)
MCH: 32.1 pg (ref 26.0–34.0)
MCHC: 34.3 g/dL (ref 30.0–36.0)
MCV: 93.5 fL (ref 80.0–100.0)
MONO ABS: 0.2 10*3/uL (ref 0.1–1.0)
Monocytes Relative: 5 %
Neutro Abs: 0.7 10*3/uL — ABNORMAL LOW (ref 1.7–7.7)
Neutrophils Relative %: 19 %
Platelets: 79 10*3/uL — ABNORMAL LOW (ref 150–400)
RBC: 3.71 MIL/uL — ABNORMAL LOW (ref 3.87–5.11)
RDW: 11.6 % (ref 11.5–15.5)
WBC: 3.6 10*3/uL — ABNORMAL LOW (ref 4.0–10.5)
nRBC: 0 % (ref 0.0–0.2)

## 2018-03-05 MED ORDER — HEPARIN SOD (PORK) LOCK FLUSH 100 UNIT/ML IV SOLN
500.0000 [IU] | Freq: Once | INTRAVENOUS | Status: DC | PRN
  Filled 2018-03-05: qty 5

## 2018-03-05 MED ORDER — SODIUM CHLORIDE 0.9 % IV SOLN
Freq: Once | INTRAVENOUS | Status: DC
  Filled 2018-03-05: qty 250

## 2018-03-05 MED ORDER — HEPARIN SOD (PORK) LOCK FLUSH 100 UNIT/ML IV SOLN
500.0000 [IU] | Freq: Once | INTRAVENOUS | Status: AC
  Administered 2018-03-05: 500 [IU] via INTRAVENOUS

## 2018-03-05 MED ORDER — PROCHLORPERAZINE MALEATE 10 MG PO TABS
10.0000 mg | ORAL_TABLET | Freq: Once | ORAL | Status: AC
  Administered 2018-03-05: 10 mg via ORAL
  Filled 2018-03-05: qty 1

## 2018-03-05 MED ORDER — SODIUM CHLORIDE 0.9 % IV SOLN
Freq: Once | INTRAVENOUS | Status: AC
  Administered 2018-03-05: 10:00:00 via INTRAVENOUS
  Filled 2018-03-05: qty 250

## 2018-03-05 MED ORDER — SODIUM CHLORIDE 0.9 % IV SOLN
1600.0000 mg | Freq: Once | INTRAVENOUS | Status: AC
  Administered 2018-03-05: 1600 mg via INTRAVENOUS
  Filled 2018-03-05: qty 26.3

## 2018-03-05 MED ORDER — SODIUM CHLORIDE 0.9% FLUSH
10.0000 mL | Freq: Once | INTRAVENOUS | Status: AC
  Administered 2018-03-05: 10 mL via INTRAVENOUS
  Filled 2018-03-05: qty 10

## 2018-03-05 NOTE — Progress Notes (Signed)
ANC 0.7, Plt 79.  Gemzar dose reduced.  Ok to proceed.

## 2018-03-05 NOTE — Progress Notes (Signed)
Patient is here today to follow up on her adenocarcinoma of ovary. Patient stated that she had been suffering from constipation but started taking Milk of magnesium and stool softener and it had helped. Patient is also needing a handicap decal form filled out.

## 2018-03-06 LAB — CA 125: Cancer Antigen (CA) 125: 201 U/mL — ABNORMAL HIGH (ref 0.0–38.1)

## 2018-03-12 ENCOUNTER — Ambulatory Visit (INDEPENDENT_AMBULATORY_CARE_PROVIDER_SITE_OTHER): Payer: PPO | Admitting: Family Medicine

## 2018-03-12 ENCOUNTER — Encounter: Payer: Self-pay | Admitting: Family Medicine

## 2018-03-12 VITALS — BP 130/80 | HR 91 | Temp 97.9°F | Resp 16 | Ht 62.0 in | Wt 156.3 lb

## 2018-03-12 DIAGNOSIS — M5441 Lumbago with sciatica, right side: Secondary | ICD-10-CM

## 2018-03-12 DIAGNOSIS — G43009 Migraine without aura, not intractable, without status migrainosus: Secondary | ICD-10-CM | POA: Diagnosis not present

## 2018-03-12 DIAGNOSIS — G4700 Insomnia, unspecified: Secondary | ICD-10-CM | POA: Diagnosis not present

## 2018-03-12 DIAGNOSIS — J3089 Other allergic rhinitis: Secondary | ICD-10-CM | POA: Diagnosis not present

## 2018-03-12 DIAGNOSIS — D61818 Other pancytopenia: Secondary | ICD-10-CM | POA: Diagnosis not present

## 2018-03-12 DIAGNOSIS — Z1239 Encounter for other screening for malignant neoplasm of breast: Secondary | ICD-10-CM

## 2018-03-12 DIAGNOSIS — G8929 Other chronic pain: Secondary | ICD-10-CM

## 2018-03-12 DIAGNOSIS — G620 Drug-induced polyneuropathy: Secondary | ICD-10-CM

## 2018-03-12 DIAGNOSIS — F329 Major depressive disorder, single episode, unspecified: Secondary | ICD-10-CM

## 2018-03-12 DIAGNOSIS — M542 Cervicalgia: Secondary | ICD-10-CM

## 2018-03-12 DIAGNOSIS — C800 Disseminated malignant neoplasm, unspecified: Secondary | ICD-10-CM

## 2018-03-12 DIAGNOSIS — T451X5A Adverse effect of antineoplastic and immunosuppressive drugs, initial encounter: Secondary | ICD-10-CM

## 2018-03-12 MED ORDER — FLUTICASONE PROPIONATE 50 MCG/ACT NA SUSP: 2.0000 | NASAL | 1 refills | Status: DC | PRN

## 2018-03-12 MED ORDER — TEMAZEPAM 15 MG PO CAPS: 15.0000 mg | ORAL_CAPSULE | Freq: Every evening | ORAL | 0 refills | Status: DC | PRN

## 2018-03-12 MED ORDER — SUMATRIPTAN SUCCINATE 100 MG PO TABS: ORAL_TABLET | ORAL | 0 refills | Status: DC

## 2018-03-12 MED ORDER — DULOXETINE HCL 60 MG PO CPEP: ORAL_CAPSULE | ORAL | 1 refills | Status: DC

## 2018-03-12 MED ORDER — GABAPENTIN 300 MG PO CAPS: 300.0000 mg | ORAL_CAPSULE | Freq: Three times a day (TID) | ORAL | 1 refills | Status: DC

## 2018-03-12 NOTE — Progress Notes (Signed)
Name: Jessica Burgess   MRN: [id number]    DOB: Jan 16, 1956   Date:03/12/2018       Progress Note  Subjective  Chief Complaint  Chief Complaint  Patient presents with  . Depression  . Insomnia    HPI  HTN: secondary to chemo medication, off medication now since there was a change in chemotherapy drugs, she states when she goes to cancer center bp goes up , but at goal here. No chest pain or palpitation   Carcinomatosis :ovarian cancer diagnosed in history of hysterectomy for fibroid many years ago, developed acute left lower abdominal pain and saw her GI and CT showed abnormal omentum, started first round of chemo 11/2016 that cause pancytopenia and also neuropathy, now on high dose neurontin, she is now on a different type of chemo. She states she gets discouraged at times but has a good family and friend supports   Insomnia: she is taking Temazpeam every night, she has been doing well , sleeping about 8 - 10 hours per night.   Migraine headaches: getting worse, she took over 20 imitrex since last visit. Episodes of migraine happens almost daily, in one week she may go one day without a headache. Migraine is described as a band around her head, throbbing and intense. Associated with nausea, photophobia and phonophobia. She has seen headache center and tried multiple prophylactic medications including topamax that either did not work or caused side effects. Discussed new medication class and she is willing to try Emgality today. Loading dose given during her visit.   Chronic back pain; previous history of back surgery, still has daily pain. Took Fentanyl patch years ago but caused worsening of migrainesat the time, however since placed back on medication by hematologist and because pain is higher from neuropathy and cancer therapy, she stopped duragesic because it was making her loopy, but is taking oxycodone  She has been taking Percocet for many years.   Depression Major : sheis  stable at this time, she had a set back shortly after cancer diagnosis,she is back on Duloxetine, she states Prozac causes her not to be able to cry, she has down days and is willing to go up on duloxetine to 120 mg daily   Cervical spine stenosis and radiculitis: s/p neck ( last surgery Dr. Arnoldo Morale March 2017 ) surgery was zero at times, but can flare, sometimes radiates to her head and causes headaches. Unchanged   Migraine headache: 12 episodes a month, taking Gabapentin, she states having to take sumatripan to help with symptoms. She does not any other referrals at this time. Just trying to get rid of ovarian cancer Unchanged   Hypothyroidism: she is off  Medication ,last TSH was at goal. Unchanged   Patient Active Problem List   Diagnosis Date Noted  . 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 Mucositis due to chemotherapy 09/02/2017  . Goals of care, counseling/discussion 08/08/2017  . Hypothyroidism due to medication 07/28/2017  . Genetic testing 12/18/2016  . Migraines 12/01/2016  . Postoperative seroma of subcutaneous tissue after non-dermatologic procedure 12/01/2016  . Transaminitis 12/01/2016  . Anemia associated with acute blood loss 11/20/2016  . Ovarian cancer, unspecified laterality ([place]) 11/12/2016  . 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 Examination of participant in clinical trial 11/01/2016  . Chronic right-sided low back pain with right-sided sciatica 02/02/2016  . History of neck surgery 08/01/2015  . Hematoma 06/14/2015  . Perennial allergic rhinitis 05/05/2015  . Generalized  anxiety disorder 02/03/2015  . Back pain, chronic 08/25/2014  . Insomnia, persistent 08/25/2014  . Chronic cervical pain 08/25/2014  . Major depression, chronic ([place]) 08/25/2014  . Dyslipidemia 08/25/2014  . Gastro-esophageal reflux disease without esophagitis 08/25/2014  . H/O high risk medication treatment 08/25/2014  . Blood glucose elevated 08/25/2014   . Migraine without aura and without status migrainosus, not intractable 08/25/2014  . Climacteric 08/25/2014  . Dysmetabolic syndrome 09/15/1599  . Fungal infection of toenail 08/25/2014  . Obesity (BMI 30.0-34.9) 08/25/2014  . Vitamin D deficiency 08/25/2014  . Engages in travel abroad 08/25/2014  . Cervical disc disorder with radiculopathy 04/28/2013    Past Surgical History:  Procedure Laterality Date  . ABDOMINAL HYSTERECTOMY    . ANTERIOR CERVICAL DECOMP/DISCECTOMY FUSION N/A 06/07/2015   Procedure: Cervical three - four and Cervical six- seven anterior cervical decompression with fusion interbody prosthesis plating and bonegraft;  Surgeon: Newman Pies, MD;  Location: [facility];  Service: Neurosurgery;  Laterality: N/A;  C34 and C67 anterior cervical decompression with fusion interbody prosthesis plating and bonegraft  . BACK SURGERY     x2 Lower   . EVACUATION OF CERVICAL HEMATOMA N/A 06/14/2015   Procedure: EVACUATION OF CERVICAL HEMATOMA;  Surgeon: Newman Pies, MD;  Location: [facility];  Service: Neurosurgery;  Laterality: N/A;  . NECK SURGERY     x3  . TONSILLECTOMY      Family History  Problem Relation Age of Onset  . Depression Mother   . Migraines Mother   . Dementia Father   . Diabetes Father   . Hyperlipidemia Father   . Hyperlipidemia Brother   . Hyperlipidemia Brother   . Breast cancer Paternal Aunt        29s    Social History   Socioeconomic History  . Marital status: Single    Spouse name: Not on file  . Number of children: 0  . Years of education: some college  . Highest education level: 12th grade  Occupational History    Employer: DISABLED  . Occupation: Disabled   Social Needs  . Financial resource strain: Very hard  . Food insecurity:    Worry: Never true    Inability: Never true  . Transportation needs:    Medical: No    Non-medical: No  Tobacco Use  . Smoking status: Former Smoker    Packs/day: 1.50    Years: 20.00     Pack years: 30.00    Types: Cigarettes    Start date: 03/19/1979    Last attempt to quit: 08/25/1999    Years since quitting: 18.5  . Smokeless tobacco: Never Used  . Tobacco comment: smoking cessation materials not required  Substance and Sexual Activity  . Alcohol use: Not Currently    Alcohol/week: 0.0 standard drinks  . Drug use: No  . Sexual activity: Never  Lifestyle  . Physical activity:    Days per week: 0 days    Minutes per session: 0 min  . Stress: Very much  Relationships  . Social connections:    Talks on phone: Patient refused    Gets together: Patient refused    Attends religious service: Patient refused    Active member of club or organization: Patient refused    Attends meetings of clubs or organizations: Patient refused    Relationship status: Patient refused  . Intimate partner violence:    Fear of current or ex partner: No    Emotionally abused: No    Physically  abused: No    Forced sexual activity: No  Other Topics Concern  . Not on file  Social History Narrative   Patient is single.    Patient lives with roommates.    Patient on disability    Patient has no children.    Patient has some college      Current Outpatient Medications:  .  albuterol (PROVENTIL HFA;VENTOLIN HFA) 108 (90 Base) MCG/ACT inhaler, Inhale 2 puffs into the lungs every 6 (six) hours as needed for wheezing or shortness of breath., Disp: 1 Inhaler, Rfl: 2 .  albuterol (PROVENTIL) (2.5 MG/3ML) 0.083% nebulizer solution, Take 3 mLs (2.5 mg total) by nebulization every 6 (six) hours as needed for wheezing or shortness of breath., Disp: 75 mL, Rfl: 2 .  Ascorbic Acid (VITAMIN C PO), Take 1 tablet by mouth daily. , Disp: , Rfl:  .  budesonide-formoterol (SYMBICORT) 160-4.5 MCG/ACT inhaler, Inhale 2 puffs into the lungs 2 (two) times daily., Disp: , Rfl:  .  Cholecalciferol (VITAMIN D-1000 MAX ST) 1000 UNITS tablet, Take 1 tablet by mouth 2 (two) times daily., Disp: , Rfl:  .   Cyanocobalamin (B-12 PO), Take 1 tablet by mouth daily. , Disp: , Rfl:  .  cyclobenzaprine (FLEXERIL) 10 MG tablet, Take as directed, Disp: 30 tablet, Rfl: 1 .  DULoxetine (CYMBALTA) 60 MG capsule, TAKE 1 CAPSULE(60 MG) BY MOUTH DAILY, Disp: 180 capsule, Rfl: 1 .  estradiol (ESTRACE) 0.5 MG tablet, Take 1 tablet (0.5 mg total) by mouth daily., Disp: 90 tablet, Rfl: 4 .  ferrous sulfate (IRON SUPPLEMENT) 325 (65 FE) MG tablet, Take 1 tablet by mouth daily., Disp: , Rfl:  .  fluticasone (FLONASE) 50 MCG/ACT nasal spray, Place 2 sprays into both nostrils as needed., Disp: 48 g, Rfl: 1 .  gabapentin (NEURONTIN) 300 MG capsule, Take 1-3 capsules (300-900 mg total) by mouth 3 (three) times daily. 300 mg twice a day and 900 mg at night, Disp: 540 capsule, Rfl: 1 .  ipratropium (ATROVENT HFA) 17 MCG/ACT inhaler, Inhale 2 puffs into the lungs every 6 (six) hours as needed. , Disp: , Rfl:  .  lidocaine (XYLOCAINE) 2 % solution, Use as directed 15 mLs in the mouth or throat as needed for mouth pain., Disp: 100 mL, Rfl: 0 .  Lysine 1000 MG TABS, Take 1 tablet by mouth daily. , Disp: , Rfl:  .  magic mouthwash w/lidocaine SOLN, Take 5 mLs by mouth 3 (three) times daily., Disp: 315 mL, Rfl: 0 .  Magnesium Oxide 500 MG CAPS, Take 500 mg by mouth 2 (two) times daily. , Disp: , Rfl:  .  Multiple Vitamins-Minerals (MULTIVITAMIN PO), Take 1 tablet by mouth daily. , Disp: , Rfl:  .  Omega-3 Fatty Acids (FISH OIL PO), Take 1 capsule by mouth 2 (two) times daily. , Disp: , Rfl:  .  oxyCODONE-acetaminophen (PERCOCET) 10-325 MG tablet, Take 1 tablet by mouth every 8 (eight) hours as needed for pain., Disp: 90 tablet, Rfl: 0 .  potassium gluconate 595 MG TABS tablet, Take 595 mg by mouth daily., Disp: , Rfl:  .  SUMAtriptan (IMITREX) 100 MG tablet, May repeat in 2 hours if headache persists or recurs., Disp: 27 tablet, Rfl: 0 .  temazepam (RESTORIL) 15 MG capsule, Take 1 capsule (15 mg total) by mouth at bedtime as needed  for sleep., Disp: 90 capsule, Rfl: 0 .  triamcinolone (KENALOG) 0.1 % paste, Use as directed 1 application in the mouth or throat 2 (  two) times daily., Disp: 5 g, Rfl: 2 .  urea (CARMOL) 10 % cream, Apply topically as needed., Disp: 410 g, Rfl: 1 .  valACYclovir (VALTREX) 1000 MG tablet, TAKE 1 TABLET(1000 MG) BY MOUTH THREE TIMES DAILY FOR 7 DAYS, Disp: 21 tablet, Rfl: 0 No current facility-administered medications for this visit.   Facility-Administered Medications Ordered in Other Visits:  .  0.9 %  sodium chloride infusion, , Intravenous, Once, Finnegan, Kathlene November, MD .  0.9 %  sodium chloride infusion, , Intravenous, Once, Grayland Ormond, Kathlene November, MD .  dexamethasone (DECADRON) 20 mg in sodium chloride 0.9 % 50 mL IVPB, 20 mg, Intravenous, Once, Grayland Ormond, Kathlene November, MD .  dexamethasone (DECADRON) injection 10 mg, 10 mg, Intravenous, Once, Grayland Ormond, Kathlene November, MD .  sodium chloride flush (NS) 0.9 % injection 10 mL, 10 mL, Intravenous, PRN, Lloyd Huger, MD, 10 mL at 06/16/17 0854  No Known Allergies  I personally reviewed active problem list, medication list, allergies, family history, social history with the patient/caregiver today.   ROS  Constitutional: Negative for fever or weight change.  Respiratory: Negative for cough and shortness of breath.   Cardiovascular: Negative for chest pain or palpitations.  Gastrointestinal: Negative for abdominal pain, no bowel changes.  Musculoskeletal: Negative for gait problem or joint swelling.  Skin: Negative for rash.  Neurological: Negative for dizziness or headache.  No other specific complaints in a complete review of systems (except as listed in HPI above).  Objective  Vitals:   03/12/18 0930  BP: 130/80  Pulse: 91  Resp: 16  Temp: 97.9 F (36.6 C)  TempSrc: Oral  SpO2: 98%  Weight: 156 lb 4.8 oz (70.9 kg)  Height: _0  (1.575 m)    Body mass index is 28.59 kg/m.  Physical Exam  Constitutional: Patient appears  well-developed and well-nourished. Obese  No distress.  HEENT: head atraumatic, normocephalic, pupils equal and reactive to light, neck supple, throat within normal limits Cardiovascular: Normal rate, regular rhythm and normal heart sounds.  No murmur heard. No BLE edema. Pulmonary/Chest: Effort normal and breath sounds normal. No respiratory distress. Muscular Skeletal: pain during palpation of right lower back  Abdominal: Soft.  There is no tenderness. Psychiatric: Patient has a normal mood and affect. behavior is normal. Judgment and thought content normal.  Recent Results (from the past 2160 hour(s))  CBC with Differential     Status: Abnormal   Collection Time: 01/07/18  1:46 PM  Result Value Ref Range   WBC 5.7 4.0 - 10.5 K/uL   RBC 3.78 (L) 3.87 - 5.11 MIL/uL   Hemoglobin 12.3 12.0 - 15.0 g/dL   HCT 37.0 36.0 - 46.0 %   MCV 97.9 80.0 - 100.0 fL   MCH 32.5 26.0 - 34.0 pg   MCHC 33.2 30.0 - 36.0 g/dL   RDW 13.0 11.5 - 15.5 %   Platelets 209 150 - 400 K/uL   nRBC 0.0 0.0 - 0.2 %   Neutrophils Relative % 46 %   Neutro Abs 2.7 1.7 - 7.7 K/uL   Lymphocytes Relative 37 %   Lymphs Abs 2.1 0.7 - 4.0 K/uL   Monocytes Relative 10 %   Monocytes Absolute 0.5 0.1 - 1.0 K/uL   Eosinophils Relative 6 %   Eosinophils Absolute 0.3 0.0 - 0.5 K/uL   Basophils Relative 1 %   Basophils Absolute 0.1 0.0 - 0.1 K/uL   Immature Granulocytes 0 %   Abs Immature Granulocytes 0.02 0.00 - 0.07 K/uL  Comment: Performed at [facility], [address] [place], [place] [address]  Comprehensive metabolic panel     Status: None   Collection Time: 01/07/18  1:46 PM  Result Value Ref Range   Sodium 138 135 - 145 mmol/L   Potassium 3.7 3.5 - 5.1 mmol/L   Chloride 102 98 - 111 mmol/L   CO2 30 22 - 32 mmol/L   Glucose, Bld 85 70 - 99 mg/dL   BUN 17 8 - 23 mg/dL   Creatinine, Ser 0.85 0.44 - 1.00 mg/dL   Calcium 9.8 8.9 - 10.3 mg/dL   Total Protein 7.2 6.5 - 8.1 g/dL   Albumin 4.0 3.5 - 5.0  g/dL   AST 25 15 - 41 U/L   ALT 20 0 - 44 U/L   Alkaline Phosphatase 64 38 - 126 U/L   Total Bilirubin 0.5 0.3 - 1.2 mg/dL   GFR calc non Af Amer >60 >60 mL/min   GFR calc Af Amer >60 >60 mL/min    Comment: (NOTE) The eGFR has been calculated using the CKD EPI equation. This calculation has not been validated in all clinical situations. eGFR's persistently <60 mL/min signify possible Chronic Kidney Disease.    Anion gap 6 5 - 15    Comment: Performed at [facility], [place], [place] [address]  CA 125     Status: Abnormal   Collection Time: 01/07/18  1:46 PM  Result Value Ref Range   Cancer Antigen (CA) 125 129.0 (H) 0.0 - 38.1 U/mL    Comment: (NOTE) Roche [employer]ostics Electrochemiluminescence Immunoassay (ECLIA) Values obtained with different assay methods or kits cannot be used interchangeably.  Results cannot be interpreted as absolute evidence of the presence or absence of malignant disease. Performed At: [facility] [place], [place] [id number] Rush Farmer MD [id number]   Protein, urine, random     Status: None   Collection Time: 01/07/18  1:52 PM  Result Value Ref Range   Total Protein, Urine 8 mg/dL    Comment: NO NORMAL RANGE ESTABLISHED FOR THIS TEST Performed at [facility], [address] [place], [place] [address]   CA 125     Status: Abnormal   Collection Time: 02/02/18 11:53 AM  Result Value Ref Range   Cancer Antigen (CA) 125 253.0 (H) 0.0 - 38.1 U/mL    Comment: (NOTE) Roche [employer] Electrochemiluminescence Immunoassay (ECLIA) Values obtained with different assay methods or kits cannot be used interchangeably.  Results cannot be interpreted as absolute evidence of the presence or absence of malignant disease. Performed At: [facility] [place], [place] [id number] Rush Farmer MD [id number]   CBC with Differential     Status: None   Collection Time: 02/02/18  11:53 AM  Result Value Ref Range   WBC 5.9 4.0 - 10.5 K/uL   RBC 4.47 3.87 - 5.11 MIL/uL   Hemoglobin 14.1 12.0 - 15.0 g/dL   HCT 43.2 36.0 - 46.0 %   MCV 96.6 80.0 - 100.0 fL   MCH 31.5 26.0 - 34.0 pg   MCHC 32.6 30.0 - 36.0 g/dL   RDW 12.5 11.5 - 15.5 %   Platelets 259 150 - 400 K/uL   nRBC 0.0 0.0 - 0.2 %   Neutrophils Relative % 54 %   Neutro Abs 3.2 1.7 - 7.7 K/uL   Lymphocytes Relative 32 %   Lymphs Abs 1.9 0.7 - 4.0 K/uL   Monocytes Relative 8 %  Monocytes Absolute 0.5 0.1 - 1.0 K/uL   Eosinophils Relative 4 %   Eosinophils Absolute 0.2 0.0 - 0.5 K/uL   Basophils Relative 1 %   Basophils Absolute 0.1 0.0 - 0.1 K/uL   Immature Granulocytes 1 %   Abs Immature Granulocytes 0.03 0.00 - 0.07 K/uL    Comment: Performed at [facility], [address] [place], [place] [address]  Comprehensive metabolic panel     Status: Abnormal   Collection Time: 02/02/18 11:53 AM  Result Value Ref Range   Sodium 135 135 - 145 mmol/L   Potassium 4.7 3.5 - 5.1 mmol/L   Chloride 95 (L) 98 - 111 mmol/L   CO2 30 22 - 32 mmol/L   Glucose, Bld 109 (H) 70 - 99 mg/dL   BUN 11 8 - 23 mg/dL   Creatinine, Ser 0.85 0.44 - 1.00 mg/dL   Calcium 9.4 8.9 - 10.3 mg/dL   Total Protein 7.8 6.5 - 8.1 g/dL   Albumin 4.2 3.5 - 5.0 g/dL   AST 35 15 - 41 U/L   ALT 23 0 - 44 U/L   Alkaline Phosphatase 79 38 - 126 U/L   Total Bilirubin 0.2 (L) 0.3 - 1.2 mg/dL   GFR calc non Af Amer >60 >60 mL/min   GFR calc Af Amer >60 >60 mL/min    Comment: (NOTE) The eGFR has been calculated using the CKD EPI equation. This calculation has not been validated in all clinical situations. eGFR's persistently <60 mL/min signify possible Chronic Kidney Disease.    Anion gap 10 5 - 15    Comment: Performed at [facility], [address] [place], [place] [address]  Protein, urine, random     Status: None   Collection Time: 02/02/18 12:05 PM  Result Value Ref Range   Total Protein, Urine <6 mg/dL     Comment: NO NORMAL RANGE ESTABLISHED FOR THIS TEST Performed at [facility], [address] [place], [place] [address]   Comprehensive metabolic panel     Status: None   Collection Time: 02/19/18  8:45 AM  Result Value Ref Range   Sodium 138 135 - 145 mmol/L   Potassium 3.6 3.5 - 5.1 mmol/L   Chloride 100 98 - 111 mmol/L   CO2 28 22 - 32 mmol/L   Glucose, Bld 95 70 - 99 mg/dL   BUN 21 8 - 23 mg/dL   Creatinine, Ser 0.85 0.44 - 1.00 mg/dL   Calcium 9.1 8.9 - 10.3 mg/dL   Total Protein 7.1 6.5 - 8.1 g/dL   Albumin 3.9 3.5 - 5.0 g/dL   AST 22 15 - 41 U/L   ALT 20 0 - 44 U/L   Alkaline Phosphatase 80 38 - 126 U/L   Total Bilirubin 0.4 0.3 - 1.2 mg/dL   GFR calc non Af Amer >60 >60 mL/min   GFR calc Af Amer >60 >60 mL/min   Anion gap 10 5 - 15    Comment: Performed at [facility], [address] [place], [place] [address]  CBC with Differential     Status: None   Collection Time: 02/19/18  8:45 AM  Result Value Ref Range   WBC 7.1 4.0 - 10.5 K/uL   RBC 4.06 3.87 - 5.11 MIL/uL   Hemoglobin 13.0 12.0 - 15.0 g/dL   HCT 39.3 36.0 - 46.0 %   MCV 96.8 80.0 - 100.0 fL   MCH 32.0 26.0 - 34.0 pg   MCHC  33.1 30.0 - 36.0 g/dL   RDW 12.3 11.5 - 15.5 %   Platelets 216 150 - 400 K/uL   nRBC 0.0 0.0 - 0.2 %   Neutrophils Relative % 41 %   Neutro Abs 2.9 1.7 - 7.7 K/uL   Lymphocytes Relative 41 %   Lymphs Abs 3.0 0.7 - 4.0 K/uL   Monocytes Relative 14 %   Monocytes Absolute 1.0 0.1 - 1.0 K/uL   Eosinophils Relative 2 %   Eosinophils Absolute 0.2 0.0 - 0.5 K/uL   Basophils Relative 1 %   Basophils Absolute 0.0 0.0 - 0.1 K/uL   Immature Granulocytes 1 %   Abs Immature Granulocytes 0.04 0.00 - 0.07 K/uL    Comment: Performed at [facility], [address] [place], [place] [address]  Protein, urine, random     Status: None   Collection Time: 02/19/18  8:45 AM  Result Value Ref Range   Total Protein, Urine <6 mg/dL    Comment: NO NORMAL RANGE ESTABLISHED FOR  THIS TEST Performed at [facility], [address] [place], [place] [address]   CA 125     Status: Abnormal   Collection Time: 02/19/18  8:45 AM  Result Value Ref Range   Cancer Antigen ([place]) 125 300.0 (H) 0.0 - 38.1 U/mL    Comment: (NOTE) Roche [employer]ostics Electrochemiluminescence Immunoassay (ECLIA) Values obtained with different assay methods or kits cannot be used interchangeably.  Results cannot be interpreted as absolute evidence of the presence or absence of malignant disease. Performed At: [facility] [place], [place] [id number] Rush Farmer MD [id number]   Comprehensive metabolic panel     Status: Abnormal   Collection Time: 02/26/18  9:22 AM  Result Value Ref Range   Sodium 137 135 - 145 mmol/L   Potassium 4.0 3.5 - 5.1 mmol/L   Chloride 101 98 - 111 mmol/L   CO2 29 22 - 32 mmol/L   Glucose, Bld 125 (H) 70 - 99 mg/dL   BUN 15 8 - 23 mg/dL   Creatinine, Ser 0.88 0.44 - 1.00 mg/dL   Calcium 8.8 (L) 8.9 - 10.3 mg/dL   Total Protein 7.0 6.5 - 8.1 g/dL   Albumin 3.8 3.5 - 5.0 g/dL   AST 31 15 - 41 U/L   ALT 27 0 - 44 U/L   Alkaline Phosphatase 74 38 - 126 U/L   Total Bilirubin 0.5 0.3 - 1.2 mg/dL   GFR calc non Af Amer >60 >60 mL/min   GFR calc Af Amer >60 >60 mL/min   Anion gap 7 5 - 15    Comment: Performed at [facility], [address] [place],  [address]  CBC with Differential     Status: Abnormal   Collection Time: 02/26/18  9:22 AM  Result Value Ref Range   WBC 3.3 (L) 4.0 - 10.5 K/uL   RBC 3.81 (L) 3.87 - 5.11 MIL/uL   Hemoglobin 12.1 12.0 - 15.0 g/dL   HCT 36.3 36.0 - 46.0 %   MCV 95.3 80.0 - 100.0 fL   MCH 31.8 26.0 - 34.0 pg   MCHC 33.3 30.0 - 36.0 g/dL   RDW 11.9 11.5 - 15.5 %   Platelets 132 (L) 150 - 400 K/uL   nRBC 0.0 0.0 - 0.2 %   Neutrophils Relative % 44 %   Neutro Abs 1.5 (L) 1.7 - 7.7 K/uL   Lymphocytes Relative 49 %   Lymphs Abs 1.6 0.7 -  4.0 K/uL   Monocytes Relative 6 %    Monocytes Absolute 0.2 0.1 - 1.0 K/uL   Eosinophils Relative 1 %   Eosinophils Absolute 0.0 0.0 - 0.5 K/uL   Basophils Relative 0 %   Basophils Absolute 0.0 0.0 - 0.1 K/uL   Immature Granulocytes 0 %   Abs Immature Granulocytes 0.01 0.00 - 0.07 K/uL    Comment: Performed at [facility], [address] [place], [place] [address]  CA 125     Status: Abnormal   Collection Time: 02/26/18  9:22 AM  Result Value Ref Range   Cancer Antigen (CA) 125 301.0 (H) 0.0 - 38.1 U/mL    Comment: (NOTE) Roche [employer] Electrochemiluminescence Immunoassay (ECLIA) Values obtained with different assay methods or kits cannot be used interchangeably.  Results cannot be interpreted as absolute evidence of the presence or absence of malignant disease. Performed At: [facility] [place], [place] [id number] Rush Farmer MD [id number]   CBC with Differential     Status: Abnormal   Collection Time: 03/05/18  8:20 AM  Result Value Ref Range   WBC 3.6 (L) 4.0 - 10.5 K/uL   RBC 3.71 (L) 3.87 - 5.11 MIL/uL   Hemoglobin 11.9 (L) 12.0 - 15.0 g/dL   HCT 34.7 (L) 36.0 - 46.0 %   MCV 93.5 80.0 - 100.0 fL   MCH 32.1 26.0 - 34.0 pg   MCHC 34.3 30.0 - 36.0 g/dL   RDW 11.6 11.5 - 15.5 %   Platelets 79 (L) 150 - 400 K/uL   nRBC 0.0 0.0 - 0.2 %   Neutrophils Relative % 19 %   Neutro Abs 0.7 (L) 1.7 - 7.7 K/uL   Lymphocytes Relative 74 %   Lymphs Abs 2.6 0.7 - 4.0 K/uL   Monocytes Relative 5 %   Monocytes Absolute 0.2 0.1 - 1.0 K/uL   Eosinophils Relative 1 %   Eosinophils Absolute 0.0 0.0 - 0.5 K/uL   Basophils Relative 0 %   Basophils Absolute 0.0 0.0 - 0.1 K/uL   WBC Morphology MORPHOLOGY UNREMARKABLE    Smear Review SMEAR SCANNED    Immature Granulocytes 1 %   Abs Immature Granulocytes 0.03 0.00 - 0.07 K/uL    Comment: Performed at [facility], [address] [place], [place] [address]  Comprehensive metabolic panel     Status: Abnormal   Collection Time:  03/05/18  8:20 AM  Result Value Ref Range   Sodium 137 135 - 145 mmol/L   Potassium 4.1 3.5 - 5.1 mmol/L   Chloride 99 98 - 111 mmol/L   CO2 28 22 - 32 mmol/L   Glucose, Bld 96 70 - 99 mg/dL   BUN 17 8 - 23 mg/dL   Creatinine, Ser 0.81 0.44 - 1.00 mg/dL   Calcium 9.0 8.9 - 10.3 mg/dL   Total Protein 7.2 6.5 - 8.1 g/dL   Albumin 3.9 3.5 - 5.0 g/dL   AST 51 (H) 15 - 41 U/L   ALT 53 (H) 0 - 44 U/L   Alkaline Phosphatase 87 38 - 126 U/L   Total Bilirubin 0.4 0.3 - 1.2 mg/dL   GFR calc non Af Amer >60 >60 mL/min   GFR calc Af Amer >60 >60 mL/min   Anion gap 10 5 - 15    Comment: Performed at [facility], [address], [place], [place] [address]  CA 125     Status: Abnormal   Collection Time: 03/05/18  8:20 AM  Result Value Ref Range   Cancer Antigen (CA) 125 201.0 (H) 0.0 - 38.1 U/mL    Comment: (NOTE) Roche [employer] Electrochemiluminescence Immunoassay (ECLIA) Values obtained with different assay methods or kits cannot be used interchangeably.  Results cannot be interpreted as absolute evidence of the presence or absence of malignant disease. Performed At: [facility] [place], [place] [id number] Rush Farmer MD YJ:[id number]       PHQ2/9: Depression screen [facility] 2/9 03/12/2018 12/03/2017 10/28/2017 10/28/2017 08/06/2017  Decreased Interest 2 0 0 0 0  Down, Depressed, Hopeless 1 0 0 0 0  PHQ - 2 Score 3 0 0 0 0  Altered sleeping 0 0 0 - -  Tired, decreased energy 3 3 0 - -  Change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

## 2018-03-19 ENCOUNTER — Other Ambulatory Visit: Payer: PPO

## 2018-03-19 ENCOUNTER — Ambulatory Visit: Payer: PPO

## 2018-03-19 ENCOUNTER — Ambulatory Visit: Payer: PPO | Admitting: Oncology

## 2018-03-20 NOTE — Progress Notes (Signed)
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  She continues to tolerate her treatments well without significant side effects.  She currently feels well and is asymptomatic.  Her peripheral neuropathy is unchanged.  She has no other neurologic complaints.  She denies any recent fevers or illnesses.  She has a good appetite and her weight has remained stable. She has no chest pain or shortness of breath. She denies any nausea, vomiting, constipation, or diarrhea.  She denies any abdominal pain or bloating.  She has no urinary complaints.  Patient offers no specific complaints today.    REVIEW OF SYSTEMS:   Review of Systems  Constitutional: Negative.  Negative for fever, malaise/fatigue and weight loss.  HENT: Negative.  Negative for sore throat.   Respiratory: Negative.  Negative for cough and shortness of breath.   Cardiovascular: Negative.  Negative for chest pain and leg swelling.  Gastrointestinal: Negative.  Negative for abdominal pain, blood in stool, constipation, diarrhea, melena, nausea and vomiting.  Genitourinary: Negative.  Negative for dysuria.  Musculoskeletal: Positive for joint pain. Negative for back pain and neck pain.  Skin: Negative.  Negative for itching and rash.  Neurological: Positive for tingling and sensory change. Negative for focal weakness and weakness.  Psychiatric/Behavioral: Negative.   The patient is not nervous/anxious.     As per HPI. Otherwise, a complete review of systems is negative.  PAST MEDICAL HISTORY: Past Medical History:  Diagnosis Date  . Allergic rhinitis, cause unspecified   . Anxiety state, unspecified   . Arthritis   . Asthma    only when sick   . Backache, unspecified   . Bronchitis    hx of when get sick  . Cancer ([place])    skin cancer , basal cell   . Cancer ([place]) 11/2016   ovarian  . Cervicalgia   . Complication of anesthesia   . Dermatophytosis of nail   . Dysmetabolic syndrome X   . Encounter for long-term (current) use of other medications   . Esophageal reflux   . Insomnia, unspecified   . Leukocytosis, unspecified   . Migraine without aura, without mention of intractable migraine without mention of status migrainosus   . Other and unspecified hyperlipidemia   . Other malaise and fatigue   . Overweight(278.02)   . Personal history of chemotherapy now   ovarian  . PONV (postoperative nausea and vomiting)   . Spinal stenosis in cervical region   . Symptomatic menopausal or female climacteric states   . Unspecified disorder of skin and subcutaneous tissue   . Unspecified vitamin D deficiency     PAST SURGICAL HISTORY: Past Surgical History:  Procedure Laterality Date  . ABDOMINAL HYSTERECTOMY    . ANTERIOR CERVICAL DECOMP/DISCECTOMY FUSION N/A 06/07/2015   Procedure: Cervical three - four and Cervical six- seven anterior cervical decompression with fusion interbody prosthesis plating and bonegraft;  Surgeon: Newman Pies, MD;  Location: [place] [facility];  Service: Neurosurgery;  Laterality: N/A;  C34 and C67  anterior cervical decompression with fusion interbody prosthesis plating and bonegraft  . BACK SURGERY     x2 Lower   . EVACUATION OF CERVICAL HEMATOMA N/A 06/14/2015   Procedure: EVACUATION OF CERVICAL HEMATOMA;  Surgeon: Newman Pies, MD;  Location: [place] [facility];  Service: Neurosurgery;  Laterality: N/A;  . NECK SURGERY      x3  . TONSILLECTOMY      FAMILY HISTORY: Family History  Problem Relation Age of Onset  . Depression Mother   . Migraines Mother   . Dementia Father   . Diabetes Father   . Hyperlipidemia Father   . Hyperlipidemia Brother   . Hyperlipidemia Brother   . Breast cancer Paternal Aunt        79s    ADVANCED DIRECTIVES (Y/N):  N  HEALTH MAINTENANCE: Social History   Tobacco Use  . Smoking status: Former Smoker    Packs/day: 1.50    Years: 20.00    Pack years: 30.00    Types: Cigarettes    Start date: 03/19/1979    Last attempt to quit: 08/25/1999    Years since quitting: 18.5  . Smokeless tobacco: Never Used  . Tobacco comment: smoking cessation materials not required  Substance Use Topics  . Alcohol use: Not Currently    Alcohol/week: 0.0 standard drinks  . Drug use: No     Colonoscopy:  PAP:  Bone density:  Lipid panel:  No Known Allergies  Current Outpatient Medications  Medication Sig Dispense Refill  . albuterol (PROVENTIL HFA;VENTOLIN HFA) 108 (90 Base) MCG/ACT inhaler Inhale 2 puffs into the lungs every 6 (six) hours as needed for wheezing or shortness of breath. 1 Inhaler 2  . albuterol (PROVENTIL) (2.5 MG/3ML) 0.083% nebulizer solution Take 3 mLs (2.5 mg total) by nebulization every 6 (six) hours as needed for wheezing or shortness of breath. 75 mL 2  . Ascorbic Acid (VITAMIN C PO) Take 1 tablet by mouth daily.     . budesonide-formoterol (SYMBICORT) 160-4.5 MCG/ACT inhaler Inhale 2 puffs into the lungs 2 (two) times daily.    . Cholecalciferol (VITAMIN D-1000 MAX ST) 1000 UNITS tablet Take 1 tablet by mouth 2 (two) times daily.    . Cyanocobalamin (B-12 PO) Take 1 tablet by mouth daily.     . cyclobenzaprine (FLEXERIL) 10 MG tablet Take as directed 30 tablet 1  . DULoxetine (CYMBALTA) 60 MG capsule TAKE 1 CAPSULE(60 MG) BY MOUTH DAILY 180 capsule 1  . estradiol (ESTRACE) 0.5 MG tablet Take 1 tablet (0.5 mg total) by mouth daily. 90 tablet 4  . ferrous  sulfate (IRON SUPPLEMENT) 325 (65 FE) MG tablet Take 1 tablet by mouth daily.    . fluticasone (FLONASE) 50 MCG/ACT nasal spray Place 2 sprays into both nostrils as needed. 48 g 1  . gabapentin (NEURONTIN) 300 MG capsule Take 1-3 capsules (300-900 mg total) by mouth 3 (three) times daily. 300 mg twice a day and 900 mg at night 540 capsule 1  . ipratropium (ATROVENT HFA) 17 MCG/ACT inhaler Inhale 2 puffs into the lungs every 6 (six) hours as needed.     . lidocaine (XYLOCAINE) 2 % solution Use as directed 15 mLs in the mouth or throat as needed for mouth pain. 100 mL 0  . Lysine 1000 MG TABS Take 1 tablet by mouth daily.     . magic mouthwash w/lidocaine SOLN Take 5 mLs by mouth 3 (three) times daily. 315 mL 0  . Magnesium Oxide 500 MG CAPS Take  500 mg by mouth 2 (two) times daily.     . Multiple Vitamins-Minerals (MULTIVITAMIN PO) Take 1 tablet by mouth daily.     . Omega-3 Fatty Acids (FISH OIL PO) Take 1 capsule by mouth 2 (two) times daily.     Marland Kitchen oxyCODONE-acetaminophen (PERCOCET) 10-325 MG tablet Take 1 tablet by mouth every 8 (eight) hours as needed for pain. 90 tablet 0  . potassium gluconate 595 MG TABS tablet Take 595 mg by mouth daily.    . SUMAtriptan (IMITREX) 100 MG tablet May repeat in 2 hours if headache persists or recurs. 27 tablet 0  . temazepam (RESTORIL) 15 MG capsule Take 1 capsule (15 mg total) by mouth at bedtime as needed for sleep. 90 capsule 0  . triamcinolone (KENALOG) 0.1 % paste Use as directed 1 application in the mouth or throat 2 (two) times daily. 5 g 2  . urea (CARMOL) 10 % cream Apply topically as needed. 410 g 1  . valACYclovir (VALTREX) 1000 MG tablet TAKE 1 TABLET(1000 MG) BY MOUTH THREE TIMES DAILY FOR 7 DAYS 21 tablet 0   No current facility-administered medications for this visit.    Facility-Administered Medications Ordered in Other Visits  Medication Dose Route Frequency Provider Last Rate Last Dose  . 0.9 %  sodium chloride infusion   Intravenous Once  Lloyd Huger, MD      . 0.9 %  sodium chloride infusion   Intravenous Once Lloyd Huger, MD      . dexamethasone (DECADRON) 20 mg in sodium chloride 0.9 % 50 mL IVPB  20 mg Intravenous Once Lloyd Huger, MD      . dexamethasone (DECADRON) injection 10 mg  10 mg Intravenous Once Lloyd Huger, MD      . sodium chloride flush (NS) 0.9 % injection 10 mL  10 mL Intravenous PRN Lloyd Huger, MD   10 mL at 06/16/17 0854    OBJECTIVE: Vitals:   03/23/18 1002  BP: 123/82  Pulse: 82  Temp: (!) 96.5 F (35.8 C)     Body mass index is 29.26 kg/m.    ECOG FS:0 - Asymptomatic  General: Well-developed, well-nourished, no acute distress. Eyes: Pink conjunctiva, anicteric sclera. HEENT: Normocephalic, moist mucous membranes. Lungs: Clear to auscultation bilaterally. Heart: Regular rate and rhythm. No rubs, murmurs, or gallops. Abdomen: Soft, nontender, nondistended. No organomegaly noted, normoactive bowel sounds. Musculoskeletal: No edema, cyanosis, or clubbing. Neuro: Alert, answering all questions appropriately. Cranial nerves grossly intact. Skin: No rashes or petechiae noted. Psych: Normal affect.  LAB RESULTS:  Lab Results  Component Value Date   NA 141 03/23/2018   K 4.2 03/23/2018   CL 105 03/23/2018   CO2 28 03/23/2018   GLUCOSE 107 (H) 03/23/2018   BUN 16 03/23/2018   CREATININE 0.89 03/23/2018   CALCIUM 8.8 (L) 03/23/2018   PROT 6.9 03/23/2018   ALBUMIN 3.9 03/23/2018   AST 50 (H) 03/23/2018   ALT 61 (H) 03/23/2018   ALKPHOS 83 03/23/2018   BILITOT 0.4 03/23/2018   GFRNONAA >60 03/23/2018   GFRAA >60 03/23/2018    Lab Results  Component Value Date   WBC 4.0 03/23/2018   NEUTROABS 1.7 03/23/2018   HGB 12.1 03/23/2018   HCT 35.9 (L) 03/23/2018   MCV 96.8 03/23/2018   PLT 423 (H) 03/23/2018     STUDIES: No results found.  ONCOLOGY HISTORY: Patient underwent debulking surgery at [facility] on November 19, 2016. She received  one infusion  preoperative infusion of Keytruda on November 11, 2016 as part of a clinical trial.  She initiated adjuvant carboplatinum and Taxol on December 16, 2016.  This was continued through April 28, 2017 at which time Taxol was discontinued secondary to worsening neuropathy.  Patient then received single agent carboplatinum for additional 2 infusions.  She was then noted to have progression of disease and was switched to Avastin and doxorubicin on August 18, 2017 this was discontinued on November 24, 2017 secondary to declining performance status.  Palliative gemcitabine was initiated on February 20, 2018.   ASSESSMENT: Stage IIIc high-grade serous ovarian carcinoma  PLAN:  1. Stage IIIc high-grade serous ovarian carcinoma: See oncology history as above.  CT scans on February 02, 2018 reviewed independently with progression of disease.  CA-125 continues to decrease and is now 74.9.  Has asked about intraperitoneal chemotherapy, but the benefit is unclear with increased toxicity.  Continue current treatment as planned.  Plan on using gemcitabine on days 1, 8, and 15 with a 22 off.  Because of patient's thrombocytopenia and neutropenia, gemcitabine has been dose reduced 10%.  Proceed with cycle 2, day 1 of treatment.  Return to clinic in 1 week for further evaluation and consideration of cycle 2, day 8.   2. Pain: Well controlled.  Continue oxycodone as needed. 3. Anxiety: Chronic and unchanged.  Continue Xanax as prescribed. 4.  Peripheral neuropathy: Chronic and unchanged.  Continue gabapentin as prescribed. Will consider neurology referral in the future.   5.  Anemia: Resolved. 6.  Shoulder pain: Patient does not complain of this today.  Continue cortisone injections as needed.  Patient is also indicated she may need rotator cuff surgery in the future.  Okay to proceed from an oncology standpoint, but will have to be coordinated with chemotherapy. 7.  Thrombocytopenia: Resolved.  Dose reduce  gemcitabine as above. 8.  Neutropenia: Resolved.    Patient expressed understanding and was in agreement with this plan. She also understands that She can call clinic at any time with any questions, concerns, or complaints.   Cancer Staging Ovarian cancer, unspecified laterality ([place]) Staging form: Ovary, Fallopian Tube, and Primary Peritoneal Carcinoma, AJCC 8th Edition - Clinical: No stage assigned - Unsigned   Lloyd Huger, MD   03/25/2018 6:14 AM

## 2018-03-23 ENCOUNTER — Inpatient Hospital Stay: Payer: PPO

## 2018-03-23 ENCOUNTER — Other Ambulatory Visit: Payer: Self-pay

## 2018-03-23 ENCOUNTER — Inpatient Hospital Stay (HOSPITAL_BASED_OUTPATIENT_CLINIC_OR_DEPARTMENT_OTHER): Payer: PPO | Admitting: Oncology

## 2018-03-23 ENCOUNTER — Inpatient Hospital Stay: Payer: PPO | Attending: Oncology

## 2018-03-23 VITALS — BP 123/82 | HR 82 | Temp 96.5°F | Ht 62.0 in | Wt 160.0 lb

## 2018-03-23 DIAGNOSIS — G629 Polyneuropathy, unspecified: Secondary | ICD-10-CM

## 2018-03-23 DIAGNOSIS — C569 Malignant neoplasm of unspecified ovary: Secondary | ICD-10-CM

## 2018-03-23 DIAGNOSIS — Z87891 Personal history of nicotine dependence: Secondary | ICD-10-CM

## 2018-03-23 DIAGNOSIS — Z79899 Other long term (current) drug therapy: Secondary | ICD-10-CM | POA: Diagnosis not present

## 2018-03-23 DIAGNOSIS — F419 Anxiety disorder, unspecified: Secondary | ICD-10-CM

## 2018-03-23 DIAGNOSIS — Z5111 Encounter for antineoplastic chemotherapy: Secondary | ICD-10-CM | POA: Insufficient documentation

## 2018-03-23 DIAGNOSIS — C786 Secondary malignant neoplasm of retroperitoneum and peritoneum: Secondary | ICD-10-CM | POA: Insufficient documentation

## 2018-03-23 DIAGNOSIS — D701 Agranulocytosis secondary to cancer chemotherapy: Secondary | ICD-10-CM | POA: Insufficient documentation

## 2018-03-23 DIAGNOSIS — D696 Thrombocytopenia, unspecified: Secondary | ICD-10-CM | POA: Insufficient documentation

## 2018-03-23 DIAGNOSIS — R52 Pain, unspecified: Secondary | ICD-10-CM | POA: Insufficient documentation

## 2018-03-23 LAB — COMPREHENSIVE METABOLIC PANEL
ALT: 61 U/L — ABNORMAL HIGH (ref 0–44)
AST: 50 U/L — ABNORMAL HIGH (ref 15–41)
Albumin: 3.9 g/dL (ref 3.5–5.0)
Alkaline Phosphatase: 83 U/L (ref 38–126)
Anion gap: 8 (ref 5–15)
BILIRUBIN TOTAL: 0.4 mg/dL (ref 0.3–1.2)
BUN: 16 mg/dL (ref 8–23)
CO2: 28 mmol/L (ref 22–32)
Calcium: 8.8 mg/dL — ABNORMAL LOW (ref 8.9–10.3)
Chloride: 105 mmol/L (ref 98–111)
Creatinine, Ser: 0.89 mg/dL (ref 0.44–1.00)
GFR calc Af Amer: >60 mL/min (ref >60)
GFR calc non Af Amer: >60 mL/min (ref >60)
Glucose, Bld: 107 mg/dL — ABNORMAL HIGH (ref 70–99)
Potassium: 4.2 mmol/L (ref 3.5–5.1)
Sodium: 141 mmol/L (ref 135–145)
Total Protein: 6.9 g/dL (ref 6.5–8.1)

## 2018-03-23 LAB — CBC WITH DIFFERENTIAL/PLATELET
ABS IMMATURE GRANULOCYTES: 0.04 10*3/uL (ref 0.00–0.07)
Basophils Absolute: 0 10*3/uL (ref 0.0–0.1)
Basophils Relative: 1 %
Eosinophils Absolute: 0.1 10*3/uL (ref 0.0–0.5)
Eosinophils Relative: 2 %
HCT: 35.9 % — ABNORMAL LOW (ref 36.0–46.0)
Hemoglobin: 12.1 g/dL (ref 12.0–15.0)
Immature Granulocytes: 1 %
Lymphocytes Relative: 39 %
Lymphs Abs: 1.6 10*3/uL (ref 0.7–4.0)
MCH: 32.6 pg (ref 26.0–34.0)
MCHC: 33.7 g/dL (ref 30.0–36.0)
MCV: 96.8 fL (ref 80.0–100.0)
MONO ABS: 0.7 10*3/uL (ref 0.1–1.0)
Monocytes Relative: 16 %
Neutro Abs: 1.7 10*3/uL (ref 1.7–7.7)
Neutrophils Relative %: 41 %
Platelets: 423 10*3/uL — ABNORMAL HIGH (ref 150–400)
RBC: 3.71 MIL/uL — ABNORMAL LOW (ref 3.87–5.11)
RDW: 14.6 % (ref 11.5–15.5)
WBC: 4 10*3/uL (ref 4.0–10.5)
nRBC: 0 % (ref 0.0–0.2)

## 2018-03-23 LAB — PROTEIN, URINE, RANDOM: Total Protein, Urine: 11 mg/dL

## 2018-03-23 MED ORDER — SODIUM CHLORIDE 0.9 % IV SOLN
Freq: Once | INTRAVENOUS | Status: AC
  Administered 2018-03-23: 11:00:00 via INTRAVENOUS
  Filled 2018-03-23: qty 250

## 2018-03-23 MED ORDER — HEPARIN SOD (PORK) LOCK FLUSH 100 UNIT/ML IV SOLN
500.0000 [IU] | Freq: Once | INTRAVENOUS | Status: AC
  Administered 2018-03-23: 500 [IU] via INTRAVENOUS
  Filled 2018-03-23: qty 5

## 2018-03-23 MED ORDER — SODIUM CHLORIDE 0.9 % IV SOLN
1600.0000 mg | Freq: Once | INTRAVENOUS | Status: AC
  Administered 2018-03-23: 1600 mg via INTRAVENOUS
  Filled 2018-03-23: qty 26.3

## 2018-03-23 MED ORDER — SODIUM CHLORIDE 0.9% FLUSH
10.0000 mL | INTRAVENOUS | Status: DC | PRN
  Administered 2018-03-23: 10 mL via INTRAVENOUS
  Filled 2018-03-23: qty 10

## 2018-03-23 MED ORDER — PROCHLORPERAZINE MALEATE 10 MG PO TABS
10.0000 mg | ORAL_TABLET | Freq: Once | ORAL | Status: AC
  Administered 2018-03-23: 10 mg via ORAL
  Filled 2018-03-23: qty 1

## 2018-03-23 NOTE — Progress Notes (Signed)
Patient is here today to follow up on her ovary cancer. Patient wanted to know if she could get her treatment in a different location since she read an article that stated that she could. Please advise. Patient stated that she had been having lower pelvic pain, she would like to have her uterus and cervix to be removed.

## 2018-03-24 LAB — CA 125: Cancer Antigen (CA) 125: 74.9 U/mL — ABNORMAL HIGH (ref 0.0–38.1)

## 2018-03-27 NOTE — Progress Notes (Signed)
[place]  [phone] [phone]  ID: Jessica Burgess OB: 1955/11/02  MR#: [id number]  MHD#:[id number]  Patient Care Team: Steele Sizer, MD as PCP - General (Family Medicine) Lloyd Huger, MD as Consulting Physician (Oncology) Mellody Drown, MD as Consulting Physician (Obstetrics and Gynecology) Cathi Roan, [facility] (Pharmacist) Benedetto Goad, RN as Case Manager  CHIEF COMPLAINT: Stage IIIc high-grade serous ovarian carcinoma  INTERVAL HISTORY: Patient returns to clinic today for further evaluation and consideration of cycle 2, day 8 of single agent gemcitabine.  She is tolerating her treatments well without significant side effects.  She currently feels well and is asymptomatic. Her peripheral neuropathy is unchanged.  She has no other neurologic complaints.  She denies any recent fevers or illnesses.  She has a good appetite and her weight has remained stable. She has no chest pain or shortness of breath. She denies any nausea, vomiting, constipation, or diarrhea.  She denies any abdominal pain or bloating.  She has no urinary complaints.  Patient feels at her baseline offers no specific complaints today.  REVIEW OF SYSTEMS:   Review of Systems  Constitutional: Negative.  Negative for fever, malaise/fatigue and weight loss.  HENT: Negative.  Negative for sore throat.   Respiratory: Negative.  Negative for cough and shortness of breath.   Cardiovascular: Negative.  Negative for chest pain and leg swelling.  Gastrointestinal: Negative.  Negative for abdominal pain, blood in stool, constipation, diarrhea, melena, nausea and vomiting.  Genitourinary: Negative.  Negative for dysuria.  Musculoskeletal: Positive for joint pain. Negative for back pain and neck pain.  Skin: Negative.  Negative for itching and rash.  Neurological: Positive for tingling and sensory change. Negative for focal weakness and weakness.  Psychiatric/Behavioral:  Negative.  The patient is not nervous/anxious.     As per HPI. Otherwise, a complete review of systems is negative.  PAST MEDICAL HISTORY: Past Medical History:  Diagnosis Date  . Allergic rhinitis, cause unspecified   . Anxiety state, unspecified   . Arthritis   . Asthma    only when sick   . Backache, unspecified   . Bronchitis    hx of when get sick  . Cancer ([place])    skin cancer , basal cell   . Cancer ([place]) 11/2016   ovarian  . Cervicalgia   . Complication of anesthesia   . Dermatophytosis of nail   . Dysmetabolic syndrome X   . Encounter for long-term (current) use of other medications   . Esophageal reflux   . Insomnia, unspecified   . Leukocytosis, unspecified   . Migraine without aura, without mention of intractable migraine without mention of status migrainosus   . Other and unspecified hyperlipidemia   . Other malaise and fatigue   . Overweight(278.02)   . Personal history of chemotherapy now   ovarian  . PONV (postoperative nausea and vomiting)   . Spinal stenosis in cervical region   . Symptomatic menopausal or female climacteric states   . Unspecified disorder of skin and subcutaneous tissue   . Unspecified vitamin D deficiency     PAST SURGICAL HISTORY: Past Surgical History:  Procedure Laterality Date  . ABDOMINAL HYSTERECTOMY    . ANTERIOR CERVICAL DECOMP/DISCECTOMY FUSION N/A 06/07/2015   Procedure: Cervical three - four and Cervical six- seven anterior cervical decompression with fusion interbody prosthesis plating and bonegraft;  Surgeon: Newman Pies, MD;  Location: [place] [facility];  Service: Neurosurgery;  Laterality: N/A;  C34 and C67  anterior cervical decompression with fusion interbody prosthesis plating and bonegraft  . BACK SURGERY     x2 Lower   . EVACUATION OF CERVICAL HEMATOMA N/A 06/14/2015   Procedure: EVACUATION OF CERVICAL HEMATOMA;  Surgeon: Newman Pies, MD;  Location: [place] [facility];  Service: Neurosurgery;  Laterality: N/A;  .  NECK SURGERY     x3  . TONSILLECTOMY      FAMILY HISTORY: Family History  Problem Relation Age of Onset  . Depression Mother   . Migraines Mother   . Dementia Father   . Diabetes Father   . Hyperlipidemia Father   . Hyperlipidemia Brother   . Hyperlipidemia Brother   . Breast cancer Paternal Aunt        33s    ADVANCED DIRECTIVES (Y/N):  N  HEALTH MAINTENANCE: Social History   Tobacco Use  . Smoking status: Former Smoker    Packs/day: 1.50    Years: 20.00    Pack years: 30.00    Types: Cigarettes    Start date: 03/19/1979    Last attempt to quit: 08/25/1999    Years since quitting: 18.6  . Smokeless tobacco: Never Used  . Tobacco comment: smoking cessation materials not required  Substance Use Topics  . Alcohol use: Not Currently    Alcohol/week: 0.0 standard drinks  . Drug use: No     Colonoscopy:  PAP:  Bone density:  Lipid panel:  No Known Allergies  Current Outpatient Medications  Medication Sig Dispense Refill  . albuterol (PROVENTIL HFA;VENTOLIN HFA) 108 (90 Base) MCG/ACT inhaler Inhale 2 puffs into the lungs every 6 (six) hours as needed for wheezing or shortness of breath. 1 Inhaler 2  . albuterol (PROVENTIL) (2.5 MG/3ML) 0.083% nebulizer solution Take 3 mLs (2.5 mg total) by nebulization every 6 (six) hours as needed for wheezing or shortness of breath. 75 mL 2  . Ascorbic Acid (VITAMIN C PO) Take 1 tablet by mouth daily.     . budesonide-formoterol (SYMBICORT) 160-4.5 MCG/ACT inhaler Inhale 2 puffs into the lungs 2 (two) times daily.    . Cholecalciferol (VITAMIN D-1000 MAX ST) 1000 UNITS tablet Take 1 tablet by mouth 2 (two) times daily.    . Cyanocobalamin (B-12 PO) Take 1 tablet by mouth daily.     . cyclobenzaprine (FLEXERIL) 10 MG tablet Take as directed 30 tablet 1  . DULoxetine (CYMBALTA) 60 MG capsule TAKE 1 CAPSULE(60 MG) BY MOUTH DAILY 180 capsule 1  . estradiol (ESTRACE) 0.5 MG tablet Take 1 tablet (0.5 mg total) by mouth daily. 90 tablet 4    . ferrous sulfate (IRON SUPPLEMENT) 325 (65 FE) MG tablet Take 1 tablet by mouth daily.    . fluticasone (FLONASE) 50 MCG/ACT nasal spray Place 2 sprays into both nostrils as needed. 48 g 1  . gabapentin (NEURONTIN) 300 MG capsule Take 1-3 capsules (300-900 mg total) by mouth 3 (three) times daily. 300 mg twice a day and 900 mg at night 540 capsule 1  . ipratropium (ATROVENT HFA) 17 MCG/ACT inhaler Inhale 2 puffs into the lungs every 6 (six) hours as needed.     . lidocaine (XYLOCAINE) 2 % solution Use as directed 15 mLs in the mouth or throat as needed for mouth pain. 100 mL 0  . Lysine 1000 MG TABS Take 1 tablet by mouth daily.     . magic mouthwash w/lidocaine SOLN Take 5 mLs by mouth 3 (three) times daily. 315 mL 0  . Magnesium Oxide 500 MG CAPS  Take 500 mg by mouth 2 (two) times daily.     . Multiple Vitamins-Minerals (MULTIVITAMIN PO) Take 1 tablet by mouth daily.     . Omega-3 Fatty Acids (FISH OIL PO) Take 1 capsule by mouth 2 (two) times daily.     Marland Kitchen oxyCODONE-acetaminophen (PERCOCET) 10-325 MG tablet Take 1 tablet by mouth every 8 (eight) hours as needed for pain. 90 tablet 0  . potassium gluconate 595 MG TABS tablet Take 595 mg by mouth daily.    . SUMAtriptan (IMITREX) 100 MG tablet May repeat in 2 hours if headache persists or recurs. 27 tablet 0  . temazepam (RESTORIL) 15 MG capsule Take 1 capsule (15 mg total) by mouth at bedtime as needed for sleep. 90 capsule 0  . triamcinolone (KENALOG) 0.1 % paste Use as directed 1 application in the mouth or throat 2 (two) times daily. 5 g 2  . urea (CARMOL) 10 % cream Apply topically as needed. 410 g 1  . valACYclovir (VALTREX) 1000 MG tablet TAKE 1 TABLET(1000 MG) BY MOUTH THREE TIMES DAILY FOR 7 DAYS 21 tablet 0   No current facility-administered medications for this visit.    Facility-Administered Medications Ordered in Other Visits  Medication Dose Route Frequency Provider Last Rate Last Dose  . 0.9 %  sodium chloride infusion    Intravenous Once Lloyd Huger, MD      . 0.9 %  sodium chloride infusion   Intravenous Once Lloyd Huger, MD      . dexamethasone (DECADRON) 20 mg in sodium chloride 0.9 % 50 mL IVPB  20 mg Intravenous Once Lloyd Huger, MD      . dexamethasone (DECADRON) injection 10 mg  10 mg Intravenous Once Lloyd Huger, MD      . sodium chloride flush (NS) 0.9 % injection 10 mL  10 mL Intravenous PRN Lloyd Huger, MD   10 mL at 06/16/17 0854    OBJECTIVE: Vitals:   03/30/18 0941  BP: 135/86  Pulse: 79  Temp: (!) 97.3 F (36.3 C)     Body mass index is 28.9 kg/m.    ECOG FS:0 - Asymptomatic  General: Well-developed, well-nourished, no acute distress. Eyes: Pink conjunctiva, anicteric sclera. HEENT: Normocephalic, moist mucous membranes. Lungs: Clear to auscultation bilaterally. Heart: Regular rate and rhythm. No rubs, murmurs, or gallops. Abdomen: Soft, nontender, nondistended. No organomegaly noted, normoactive bowel sounds. Musculoskeletal: No edema, cyanosis, or clubbing. Neuro: Alert, answering all questions appropriately. Cranial nerves grossly intact. Skin: No rashes or petechiae noted. Psych: Normal affect.  LAB RESULTS:  Lab Results  Component Value Date   NA 140 03/30/2018   K 3.8 03/30/2018   CL 103 03/30/2018   CO2 28 03/30/2018   GLUCOSE 127 (H) 03/30/2018   BUN 14 03/30/2018   CREATININE 0.79 03/30/2018   CALCIUM 8.9 03/30/2018   PROT 6.9 03/30/2018   ALBUMIN 3.8 03/30/2018   AST 72 (H) 03/30/2018   ALT 129 (H) 03/30/2018   ALKPHOS 104 03/30/2018   BILITOT 0.5 03/30/2018   GFRNONAA >60 03/30/2018   GFRAA >60 03/30/2018    Lab Results  Component Value Date   WBC 3.9 (L) 03/30/2018   NEUTROABS 1.0 (L) 03/30/2018   HGB 11.9 (L) 03/30/2018   HCT 34.8 (L) 03/30/2018   MCV 95.6 03/30/2018   PLT 180 03/30/2018     STUDIES: No results found.  ONCOLOGY HISTORY: Patient underwent debulking surgery at [facility] on November 19, 2016. She received  one infusion preoperative infusion of Keytruda on November 11, 2016 as part of a clinical trial.  She initiated adjuvant carboplatinum and Taxol on December 16, 2016.  This was continued through April 28, 2017 at which time Taxol was discontinued secondary to worsening neuropathy.  Patient then received single agent carboplatinum for additional 2 infusions.  She was then noted to have progression of disease and was switched to Avastin and doxorubicin on August 18, 2017 this was discontinued on November 24, 2017 secondary to declining performance status.  Palliative gemcitabine was initiated on February 20, 2018.   ASSESSMENT: Stage IIIc high-grade serous ovarian carcinoma  PLAN:  1. Stage IIIc high-grade serous ovarian carcinoma: See oncology history as above.  CT scans on February 02, 2018 reviewed independently with progression of disease.  CA-125 continues to decline and is now 74.9.  Patient has inquired about intraperitoneal chemotherapy, but the benefit is unclear with increased toxicity.  Continue current treatment as planned.  Plan on using gemcitabine on days 1, 8, and 15 with a 22 off.  Because of patient's history of thrombocytopenia and neutropenia, gemcitabine has been dose reduced 10%.  Proceed with cycle 2, day 8 of treatment.  Return to clinic in 1 week for further evaluation and consideration of cycle 2, day 15. 2. Pain: Well controlled.  Continue oxycodone as needed. 3. Anxiety: Chronic and unchanged.  Continue Xanax as prescribed. 4.  Peripheral neuropathy: Chronic and unchanged.  Continue gabapentin as prescribed. Will consider neurology referral in the future.   5.  Anemia: Resolved. 6.  Shoulder pain: Patient does not complain of this today.  Continue cortisone injections as needed.  Patient is also indicated she may need rotator cuff surgery in the future.  Okay to proceed from an oncology standpoint, but will have to be coordinated with chemotherapy. 7.   Thrombocytopenia: Resolved.  Dose reduce gemcitabine as above. 8.  Neutropenia: Proceed with dose reduced treatment as above.  Patient expressed understanding and was in agreement with this plan. She also understands that She can call clinic at any time with any questions, concerns, or complaints.   Cancer Staging Ovarian cancer, unspecified laterality ([place]) Staging form: Ovary, Fallopian Tube, and Primary Peritoneal Carcinoma, AJCC 8th Edition - Clinical: No stage assigned - Unsigned   Lloyd Huger, MD   03/30/2018 1:09 PM

## 2018-03-30 ENCOUNTER — Other Ambulatory Visit: Payer: Self-pay

## 2018-03-30 ENCOUNTER — Inpatient Hospital Stay: Payer: PPO

## 2018-03-30 ENCOUNTER — Inpatient Hospital Stay (HOSPITAL_BASED_OUTPATIENT_CLINIC_OR_DEPARTMENT_OTHER): Payer: PPO | Admitting: Oncology

## 2018-03-30 VITALS — BP 135/86 | HR 79 | Temp 97.3°F | Ht 62.0 in | Wt 158.0 lb

## 2018-03-30 DIAGNOSIS — D701 Agranulocytosis secondary to cancer chemotherapy: Secondary | ICD-10-CM

## 2018-03-30 DIAGNOSIS — G629 Polyneuropathy, unspecified: Secondary | ICD-10-CM | POA: Diagnosis not present

## 2018-03-30 DIAGNOSIS — Z79899 Other long term (current) drug therapy: Secondary | ICD-10-CM | POA: Diagnosis not present

## 2018-03-30 DIAGNOSIS — Z5111 Encounter for antineoplastic chemotherapy: Secondary | ICD-10-CM | POA: Diagnosis not present

## 2018-03-30 DIAGNOSIS — Z87891 Personal history of nicotine dependence: Secondary | ICD-10-CM | POA: Diagnosis not present

## 2018-03-30 DIAGNOSIS — C569 Malignant neoplasm of unspecified ovary: Secondary | ICD-10-CM | POA: Diagnosis not present

## 2018-03-30 DIAGNOSIS — F419 Anxiety disorder, unspecified: Secondary | ICD-10-CM | POA: Diagnosis not present

## 2018-03-30 LAB — COMPREHENSIVE METABOLIC PANEL
ALT: 129 U/L — ABNORMAL HIGH (ref 0–44)
AST: 72 U/L — ABNORMAL HIGH (ref 15–41)
Albumin: 3.8 g/dL (ref 3.5–5.0)
Alkaline Phosphatase: 104 U/L (ref 38–126)
Anion gap: 9 (ref 5–15)
BUN: 14 mg/dL (ref 8–23)
CHLORIDE: 103 mmol/L (ref 98–111)
CO2: 28 mmol/L (ref 22–32)
Calcium: 8.9 mg/dL (ref 8.9–10.3)
Creatinine, Ser: 0.79 mg/dL (ref 0.44–1.00)
GFR calc Af Amer: >60 mL/min (ref >60)
GFR calc non Af Amer: >60 mL/min (ref >60)
Glucose, Bld: 127 mg/dL — ABNORMAL HIGH (ref 70–99)
Potassium: 3.8 mmol/L (ref 3.5–5.1)
Sodium: 140 mmol/L (ref 135–145)
Total Bilirubin: 0.5 mg/dL (ref 0.3–1.2)
Total Protein: 6.9 g/dL (ref 6.5–8.1)

## 2018-03-30 LAB — CBC WITH DIFFERENTIAL/PLATELET
Abs Immature Granulocytes: 0.15 10*3/uL — ABNORMAL HIGH (ref 0.00–0.07)
Basophils Absolute: 0 10*3/uL (ref 0.0–0.1)
Basophils Relative: 1 %
Eosinophils Absolute: 0 10*3/uL (ref 0.0–0.5)
Eosinophils Relative: 0 %
HCT: 34.8 % — ABNORMAL LOW (ref 36.0–46.0)
Hemoglobin: 11.9 g/dL — ABNORMAL LOW (ref 12.0–15.0)
IMMATURE GRANULOCYTES: 4 %
Lymphocytes Relative: 57 %
Lymphs Abs: 2.2 10*3/uL (ref 0.7–4.0)
MCH: 32.7 pg (ref 26.0–34.0)
MCHC: 34.2 g/dL (ref 30.0–36.0)
MCV: 95.6 fL (ref 80.0–100.0)
Monocytes Absolute: 0.4 10*3/uL (ref 0.1–1.0)
Monocytes Relative: 11 %
NEUTROS PCT: 27 %
NRBC: 0.5 % — AB (ref 0.0–0.2)
Neutro Abs: 1 10*3/uL — ABNORMAL LOW (ref 1.7–7.7)
Platelets: 180 10*3/uL (ref 150–400)
RBC: 3.64 MIL/uL — ABNORMAL LOW (ref 3.87–5.11)
RDW: 13.8 % (ref 11.5–15.5)
WBC: 3.9 10*3/uL — ABNORMAL LOW (ref 4.0–10.5)

## 2018-03-30 MED ORDER — SODIUM CHLORIDE 0.9 % IV SOLN
Freq: Once | INTRAVENOUS | Status: AC
  Administered 2018-03-30: 11:00:00 via INTRAVENOUS
  Filled 2018-03-30: qty 250

## 2018-03-30 MED ORDER — HEPARIN SOD (PORK) LOCK FLUSH 100 UNIT/ML IV SOLN
500.0000 [IU] | Freq: Once | INTRAVENOUS | Status: AC
  Administered 2018-03-30: 500 [IU] via INTRAVENOUS
  Filled 2018-03-30: qty 5

## 2018-03-30 MED ORDER — SODIUM CHLORIDE 0.9 % IV SOLN
1600.0000 mg | Freq: Once | INTRAVENOUS | Status: AC
  Administered 2018-03-30: 1600 mg via INTRAVENOUS
  Filled 2018-03-30: qty 26.3

## 2018-03-30 MED ORDER — SODIUM CHLORIDE 0.9% FLUSH
10.0000 mL | Freq: Once | INTRAVENOUS | Status: AC
  Administered 2018-03-30: 10 mL via INTRAVENOUS
  Filled 2018-03-30: qty 10

## 2018-03-30 MED ORDER — PROCHLORPERAZINE MALEATE 10 MG PO TABS
10.0000 mg | ORAL_TABLET | Freq: Once | ORAL | Status: AC
  Administered 2018-03-30: 10 mg via ORAL
  Filled 2018-03-30: qty 1

## 2018-03-30 NOTE — Progress Notes (Signed)
Patient is here today to follow up on her Primary high grade serous adenocarcinoma of ovary. Patient stated that she had been doing well.

## 2018-03-31 LAB — CA 125: Cancer Antigen (CA) 125: 73.3 U/mL — ABNORMAL HIGH (ref 0.0–38.1)

## 2018-04-03 ENCOUNTER — Other Ambulatory Visit: Payer: Self-pay | Admitting: Oncology

## 2018-04-05 NOTE — Progress Notes (Signed)
[place]  [phone] [phone]  ID: Jessica Burgess OB: 1955/04/13  MR#: [id number]  YYT#:[id number]  Patient Care Team: Steele Sizer, MD as PCP - General (Family Medicine) Lloyd Huger, MD as Consulting Physician (Oncology) Mellody Drown, MD as Consulting Physician (Obstetrics and Gynecology) Cathi Roan, [facility] (Pharmacist) Benedetto Goad, RN as Case Manager  CHIEF COMPLAINT: Stage IIIc high-grade serous ovarian carcinoma  INTERVAL HISTORY: Patient returns to clinic today for further evaluation and consideration of cycle 2, day 15 of single agent gemcitabine.  She continues to tolerate her treatments well without significant side effects.  She currently feels well.  Her peripheral neuropathy is unchanged.  She has no other neurologic complaints.  She denies any recent fevers or illnesses.  She has a good appetite and her weight has remained stable. She has no chest pain or shortness of breath. She denies any nausea, vomiting, constipation, or diarrhea.  She denies any abdominal pain or bloating.  She has no urinary complaints.  Patient feels at her baseline offers no specific complaints today.  REVIEW OF SYSTEMS:   Review of Systems  Constitutional: Negative.  Negative for fever, malaise/fatigue and weight loss.  HENT: Negative.  Negative for sore throat.   Respiratory: Negative.  Negative for cough and shortness of breath.   Cardiovascular: Negative.  Negative for chest pain and leg swelling.  Gastrointestinal: Negative.  Negative for abdominal pain, blood in stool, constipation, diarrhea, melena, nausea and vomiting.  Genitourinary: Negative.  Negative for dysuria.  Musculoskeletal: Positive for joint pain. Negative for back pain and neck pain.  Skin: Negative.  Negative for itching and rash.  Neurological: Positive for tingling and sensory change. Negative for focal weakness and weakness.  Psychiatric/Behavioral: Negative.   The patient is not nervous/anxious.     As per HPI. Otherwise, a complete review of systems is negative.  PAST MEDICAL HISTORY: Past Medical History:  Diagnosis Date  . Allergic rhinitis, cause unspecified   . Anxiety state, unspecified   . Arthritis   . Asthma    only when sick   . Backache, unspecified   . Bronchitis    hx of when get sick  . Cancer ([place])    skin cancer , basal cell   . Cancer ([place]) 11/2016   ovarian  . Cervicalgia   . Complication of anesthesia   . Dermatophytosis of nail   . Dysmetabolic syndrome X   . Encounter for long-term (current) use of other medications   . Esophageal reflux   . Insomnia, unspecified   . Leukocytosis, unspecified   . Migraine without aura, without mention of intractable migraine without mention of status migrainosus   . Other and unspecified hyperlipidemia   . Other malaise and fatigue   . Overweight(278.02)   . Personal history of chemotherapy now   ovarian  . PONV (postoperative nausea and vomiting)   . Spinal stenosis in cervical region   . Symptomatic menopausal or female climacteric states   . Unspecified disorder of skin and subcutaneous tissue   . Unspecified vitamin D deficiency     PAST SURGICAL HISTORY: Past Surgical History:  Procedure Laterality Date  . ABDOMINAL HYSTERECTOMY    . ANTERIOR CERVICAL DECOMP/DISCECTOMY FUSION N/A 06/07/2015   Procedure: Cervical three - four and Cervical six- seven anterior cervical decompression with fusion interbody prosthesis plating and bonegraft;  Surgeon: Newman Pies, MD;  Location: [place] [facility];  Service: Neurosurgery;  Laterality: N/A;  C34 and C67 anterior  cervical decompression with fusion interbody prosthesis plating and bonegraft  . BACK SURGERY     x2 Lower   . EVACUATION OF CERVICAL HEMATOMA N/A 06/14/2015   Procedure: EVACUATION OF CERVICAL HEMATOMA;  Surgeon: Newman Pies, MD;  Location: [place] [facility];  Service: Neurosurgery;  Laterality: N/A;  . NECK SURGERY      x3  . TONSILLECTOMY      FAMILY HISTORY: Family History  Problem Relation Age of Onset  . Depression Mother   . Migraines Mother   . Dementia Father   . Diabetes Father   . Hyperlipidemia Father   . Hyperlipidemia Brother   . Hyperlipidemia Brother   . Breast cancer Paternal Aunt        57s    ADVANCED DIRECTIVES (Y/N):  N  HEALTH MAINTENANCE: Social History   Tobacco Use  . Smoking status: Former Smoker    Packs/day: 1.50    Years: 20.00    Pack years: 30.00    Types: Cigarettes    Start date: 03/19/1979    Last attempt to quit: 08/25/1999    Years since quitting: 18.6  . Smokeless tobacco: Never Used  . Tobacco comment: smoking cessation materials not required  Substance Use Topics  . Alcohol use: Not Currently    Alcohol/week: 0.0 standard drinks  . Drug use: No     Colonoscopy:  PAP:  Bone density:  Lipid panel:  No Known Allergies  Current Outpatient Medications  Medication Sig Dispense Refill  . albuterol (PROVENTIL HFA;VENTOLIN HFA) 108 (90 Base) MCG/ACT inhaler Inhale 2 puffs into the lungs every 6 (six) hours as needed for wheezing or shortness of breath. 1 Inhaler 2  . albuterol (PROVENTIL) (2.5 MG/3ML) 0.083% nebulizer solution Take 3 mLs (2.5 mg total) by nebulization every 6 (six) hours as needed for wheezing or shortness of breath. 75 mL 2  . Ascorbic Acid (VITAMIN C PO) Take 1 tablet by mouth daily.     . budesonide-formoterol (SYMBICORT) 160-4.5 MCG/ACT inhaler Inhale 2 puffs into the lungs 2 (two) times daily.    . Cholecalciferol (VITAMIN D-1000 MAX ST) 1000 UNITS tablet Take 1 tablet by mouth 2 (two) times daily.    . Cyanocobalamin (B-12 PO) Take 1 tablet by mouth daily.     . cyclobenzaprine (FLEXERIL) 10 MG tablet Take as directed 30 tablet 1  . DULoxetine (CYMBALTA) 60 MG capsule TAKE 1 CAPSULE(60 MG) BY MOUTH DAILY 180 capsule 1  . estradiol (ESTRACE) 0.5 MG tablet Take 1 tablet (0.5 mg total) by mouth daily. 90 tablet 4  . ferrous  sulfate (IRON SUPPLEMENT) 325 (65 FE) MG tablet Take 1 tablet by mouth daily.    . fluticasone (FLONASE) 50 MCG/ACT nasal spray Place 2 sprays into both nostrils as needed. 48 g 1  . gabapentin (NEURONTIN) 300 MG capsule Take 1-3 capsules (300-900 mg total) by mouth 3 (three) times daily. 300 mg twice a day and 900 mg at night 540 capsule 1  . ipratropium (ATROVENT HFA) 17 MCG/ACT inhaler Inhale 2 puffs into the lungs every 6 (six) hours as needed.     . lidocaine (XYLOCAINE) 2 % solution Use as directed 15 mLs in the mouth or throat as needed for mouth pain. 100 mL 0  . Lysine 1000 MG TABS Take 1 tablet by mouth daily.     . magic mouthwash w/lidocaine SOLN Take 5 mLs by mouth 3 (three) times daily. 315 mL 0  . Magnesium Oxide 500 MG CAPS Take 500  mg by mouth 2 (two) times daily.     . Multiple Vitamins-Minerals (MULTIVITAMIN PO) Take 1 tablet by mouth daily.     . Omega-3 Fatty Acids (FISH OIL PO) Take 1 capsule by mouth 2 (two) times daily.     Marland Kitchen oxyCODONE-acetaminophen (PERCOCET) 10-325 MG tablet Take 1 tablet by mouth every 8 (eight) hours as needed for pain. 90 tablet 0  . potassium gluconate 595 MG TABS tablet Take 595 mg by mouth daily.    . SUMAtriptan (IMITREX) 100 MG tablet May repeat in 2 hours if headache persists or recurs. 27 tablet 0  . temazepam (RESTORIL) 15 MG capsule Take 1 capsule (15 mg total) by mouth at bedtime as needed for sleep. 90 capsule 0  . triamcinolone (KENALOG) 0.1 % paste Use as directed 1 application in the mouth or throat 2 (two) times daily. 5 g 2  . urea (CARMOL) 10 % cream Apply topically as needed. 410 g 1  . valACYclovir (VALTREX) 1000 MG tablet TAKE 1 TABLET(1000 MG) BY MOUTH THREE TIMES DAILY FOR 7 DAYS 21 tablet 0   No current facility-administered medications for this visit.    Facility-Administered Medications Ordered in Other Visits  Medication Dose Route Frequency Provider Last Rate Last Dose  . 0.9 %  sodium chloride infusion   Intravenous Once  Lloyd Huger, MD      . 0.9 %  sodium chloride infusion   Intravenous Once Lloyd Huger, MD      . dexamethasone (DECADRON) 20 mg in sodium chloride 0.9 % 50 mL IVPB  20 mg Intravenous Once Lloyd Huger, MD      . dexamethasone (DECADRON) injection 10 mg  10 mg Intravenous Once Lloyd Huger, MD      . sodium chloride flush (NS) 0.9 % injection 10 mL  10 mL Intravenous PRN Lloyd Huger, MD   10 mL at 06/16/17 0854    OBJECTIVE: There were no vitals filed for this visit.   There is no height or weight on file to calculate BMI.    ECOG FS:0 - Asymptomatic  General: Well-developed, well-nourished, no acute distress. Eyes: Pink conjunctiva, anicteric sclera. HEENT: Normocephalic, moist mucous membranes. Lungs: Clear to auscultation bilaterally. Heart: Regular rate and rhythm. No rubs, murmurs, or gallops. Abdomen: Soft, nontender, nondistended. No organomegaly noted, normoactive bowel sounds. Musculoskeletal: No edema, cyanosis, or clubbing. Neuro: Alert, answering all questions appropriately. Cranial nerves grossly intact. Skin: No rashes or petechiae noted. Psych: Normal affect.  LAB RESULTS:  Lab Results  Component Value Date   NA 139 04/06/2018   K 3.5 04/06/2018   CL 103 04/06/2018   CO2 26 04/06/2018   GLUCOSE 135 (H) 04/06/2018   BUN 12 04/06/2018   CREATININE 0.91 04/06/2018   CALCIUM 8.8 (L) 04/06/2018   PROT 7.1 04/06/2018   ALBUMIN 3.9 04/06/2018   AST 62 (H) 04/06/2018   ALT 80 (H) 04/06/2018   ALKPHOS 92 04/06/2018   BILITOT 0.5 04/06/2018   GFRNONAA >60 04/06/2018   GFRAA >60 04/06/2018    Lab Results  Component Value Date   WBC 5.4 04/06/2018   NEUTROABS 2.6 04/06/2018   HGB 11.9 (L) 04/06/2018   HCT 35.2 (L) 04/06/2018   MCV 95.7 04/06/2018   PLT 87 (L) 04/06/2018     STUDIES: No results found.  ONCOLOGY HISTORY: Patient underwent debulking surgery at [facility] on November 19, 2016. She received one infusion  preoperative infusion of Keytruda on November 11, 2016 as part of a clinical trial.  She initiated adjuvant carboplatinum and Taxol on December 16, 2016.  This was continued through April 28, 2017 at which time Taxol was discontinued secondary to worsening neuropathy.  Patient then received single agent carboplatinum for additional 2 infusions.  She was then noted to have progression of disease and was switched to Avastin and doxorubicin on August 18, 2017 this was discontinued on November 24, 2017 secondary to declining performance status.  Palliative gemcitabine was initiated on February 20, 2018.   ASSESSMENT: Stage IIIc high-grade serous ovarian carcinoma  PLAN:  1. Stage IIIc high-grade serous ovarian carcinoma: See oncology history as above.  CT scans on February 02, 2018 reviewed independently with progression of disease.  CA-125 continues to decline and is now 68.5.  Patient has inquired about intraperitoneal chemotherapy, but the benefit is unclear with increased toxicity.  Continue current treatment as planned.  Plan on using gemcitabine on days 1, 8, and 15 with a 22 off.  Because of patient's history of thrombocytopenia and neutropenia, gemcitabine has been dose reduced 10%.  Proceed with cycle 2, day 15 of treatment.  Return to clinic in 2 weeks for further evaluation and consideration of cycle 3, day 1.   2. Pain: Well controlled.  Continue oxycodone as needed. 3. Anxiety: Chronic and unchanged.  Continue Xanax as prescribed. 4.  Peripheral neuropathy: Chronic and unchanged.  Continue gabapentin as prescribed. Will consider neurology referral in the future.   5.  Anemia: Resolved. 6.  Shoulder pain: Patient does not complain of this today.  Continue cortisone injections as needed.  Patient is also indicated she may need rotator cuff surgery in the future.  Okay to proceed from an oncology standpoint, but will have to be coordinated with chemotherapy. 7.  Thrombocytopenia: Patient's platelet  count is decreased to 87.  Proceed with dose reduced gemcitabine as above. 8.  Neutropenia: Resolved. 9.  Elevated liver enzymes: Mild, monitor.  Patient expressed understanding and was in agreement with this plan. She also understands that She can call clinic at any time with any questions, concerns, or complaints.   Cancer Staging Ovarian cancer, unspecified laterality ([place]) Staging form: Ovary, Fallopian Tube, and Primary Peritoneal Carcinoma, AJCC 8th Edition - Clinical: No stage assigned - Unsigned   Lloyd Huger, MD   04/07/2018 6:26 AM

## 2018-04-06 ENCOUNTER — Inpatient Hospital Stay (HOSPITAL_BASED_OUTPATIENT_CLINIC_OR_DEPARTMENT_OTHER): Payer: PPO | Admitting: Oncology

## 2018-04-06 ENCOUNTER — Inpatient Hospital Stay: Payer: PPO

## 2018-04-06 ENCOUNTER — Other Ambulatory Visit: Payer: Self-pay | Admitting: *Deleted

## 2018-04-06 VITALS — BP 144/82 | HR 100 | Temp 96.3°F | Wt 157.2 lb

## 2018-04-06 DIAGNOSIS — C569 Malignant neoplasm of unspecified ovary: Secondary | ICD-10-CM

## 2018-04-06 DIAGNOSIS — C786 Secondary malignant neoplasm of retroperitoneum and peritoneum: Secondary | ICD-10-CM

## 2018-04-06 DIAGNOSIS — R52 Pain, unspecified: Secondary | ICD-10-CM

## 2018-04-06 DIAGNOSIS — D696 Thrombocytopenia, unspecified: Secondary | ICD-10-CM | POA: Diagnosis not present

## 2018-04-06 DIAGNOSIS — Z87891 Personal history of nicotine dependence: Secondary | ICD-10-CM

## 2018-04-06 DIAGNOSIS — Z5111 Encounter for antineoplastic chemotherapy: Secondary | ICD-10-CM | POA: Diagnosis not present

## 2018-04-06 LAB — CBC WITH DIFFERENTIAL/PLATELET
Abs Immature Granulocytes: 0.13 10*3/uL — ABNORMAL HIGH (ref 0.00–0.07)
Basophils Absolute: 0 10*3/uL (ref 0.0–0.1)
Basophils Relative: 1 %
Eosinophils Absolute: 0 10*3/uL (ref 0.0–0.5)
Eosinophils Relative: 0 %
HCT: 35.2 % — ABNORMAL LOW (ref 36.0–46.0)
Hemoglobin: 11.9 g/dL — ABNORMAL LOW (ref 12.0–15.0)
Immature Granulocytes: 2 %
Lymphocytes Relative: 41 %
Lymphs Abs: 2.2 10*3/uL (ref 0.7–4.0)
MCH: 32.3 pg (ref 26.0–34.0)
MCHC: 33.8 g/dL (ref 30.0–36.0)
MCV: 95.7 fL (ref 80.0–100.0)
MONO ABS: 0.4 10*3/uL (ref 0.1–1.0)
Monocytes Relative: 7 %
Neutro Abs: 2.6 10*3/uL (ref 1.7–7.7)
Neutrophils Relative %: 49 %
Platelets: 87 10*3/uL — ABNORMAL LOW (ref 150–400)
RBC: 3.68 MIL/uL — ABNORMAL LOW (ref 3.87–5.11)
RDW: 14 % (ref 11.5–15.5)
WBC: 5.4 10*3/uL (ref 4.0–10.5)
nRBC: 0 % (ref 0.0–0.2)

## 2018-04-06 LAB — COMPREHENSIVE METABOLIC PANEL
ALT: 80 U/L — ABNORMAL HIGH (ref 0–44)
AST: 62 U/L — ABNORMAL HIGH (ref 15–41)
Albumin: 3.9 g/dL (ref 3.5–5.0)
Alkaline Phosphatase: 92 U/L (ref 38–126)
Anion gap: 10 (ref 5–15)
BUN: 12 mg/dL (ref 8–23)
CHLORIDE: 103 mmol/L (ref 98–111)
CO2: 26 mmol/L (ref 22–32)
Calcium: 8.8 mg/dL — ABNORMAL LOW (ref 8.9–10.3)
Creatinine, Ser: 0.91 mg/dL (ref 0.44–1.00)
GFR calc Af Amer: >60 mL/min (ref >60)
GFR calc non Af Amer: >60 mL/min (ref >60)
Glucose, Bld: 135 mg/dL — ABNORMAL HIGH (ref 70–99)
Potassium: 3.5 mmol/L (ref 3.5–5.1)
Sodium: 139 mmol/L (ref 135–145)
Total Bilirubin: 0.5 mg/dL (ref 0.3–1.2)
Total Protein: 7.1 g/dL (ref 6.5–8.1)

## 2018-04-06 LAB — PROTEIN, URINE, RANDOM: Total Protein, Urine: 11 mg/dL

## 2018-04-06 MED ORDER — SODIUM CHLORIDE 0.9 % IV SOLN
Freq: Once | INTRAVENOUS | Status: AC
  Administered 2018-04-06: 10:00:00 via INTRAVENOUS
  Filled 2018-04-06: qty 250

## 2018-04-06 MED ORDER — SODIUM CHLORIDE 0.9 % IV SOLN
1600.0000 mg | Freq: Once | INTRAVENOUS | Status: AC
  Administered 2018-04-06: 1600 mg via INTRAVENOUS
  Filled 2018-04-06: qty 42

## 2018-04-06 MED ORDER — HEPARIN SOD (PORK) LOCK FLUSH 100 UNIT/ML IV SOLN
500.0000 [IU] | Freq: Once | INTRAVENOUS | Status: AC
  Administered 2018-04-06: 500 [IU] via INTRAVENOUS

## 2018-04-06 MED ORDER — PROCHLORPERAZINE MALEATE 10 MG PO TABS
10.0000 mg | ORAL_TABLET | Freq: Once | ORAL | Status: AC
  Administered 2018-04-06: 10 mg via ORAL
  Filled 2018-04-06: qty 1

## 2018-04-06 MED ORDER — SODIUM CHLORIDE 0.9% FLUSH
10.0000 mL | Freq: Once | INTRAVENOUS | Status: AC
  Administered 2018-04-06: 10 mL via INTRAVENOUS
  Filled 2018-04-06: qty 10

## 2018-04-06 MED ORDER — HEPARIN SOD (PORK) LOCK FLUSH 100 UNIT/ML IV SOLN
500.0000 [IU] | Freq: Once | INTRAVENOUS | Status: DC | PRN
  Filled 2018-04-06: qty 5

## 2018-04-07 LAB — CA 125: CANCER ANTIGEN (CA) 125: 68.5 U/mL — AB (ref 0.0–38.1)

## 2018-04-09 ENCOUNTER — Other Ambulatory Visit: Payer: Self-pay | Admitting: *Deleted

## 2018-04-09 ENCOUNTER — Telehealth: Payer: Self-pay | Admitting: *Deleted

## 2018-04-09 DIAGNOSIS — M542 Cervicalgia: Secondary | ICD-10-CM

## 2018-04-09 DIAGNOSIS — M5441 Lumbago with sciatica, right side: Principal | ICD-10-CM

## 2018-04-09 DIAGNOSIS — C569 Malignant neoplasm of unspecified ovary: Secondary | ICD-10-CM

## 2018-04-09 DIAGNOSIS — G8929 Other chronic pain: Secondary | ICD-10-CM

## 2018-04-09 MED ORDER — OXYCODONE-ACETAMINOPHEN 10-325 MG PO TABS: 1.0000 | ORAL_TABLET | Freq: Four times a day (QID) | ORAL | 0 refills | Status: DC | PRN

## 2018-04-09 NOTE — Telephone Encounter (Signed)
Entered in error

## 2018-04-09 NOTE — Telephone Encounter (Signed)
Once every 6 is fine.  Thank you.

## 2018-04-09 NOTE — Telephone Encounter (Addendum)
Patient called requesting refill of her Oxycodone stating that she is using it more frequently than prescribed due to increased pain. She is taking 1 every 6 hours instead of every 8 hours as needed. She requests a larger amt be ordered with new directions for use to every 6 hours as needed. Please advise.

## 2018-04-17 NOTE — Progress Notes (Signed)
[place]  [phone] [phone]  ID: Jessica Burgess OB: 1956/01/16  MR#: [id number]  DXI#:[id number]  Patient Care Team: Steele Sizer, MD as PCP - General (Family Medicine) Lloyd Huger, MD as Consulting Physician (Oncology) Mellody Drown, MD as Consulting Physician (Obstetrics and Gynecology) Cathi Roan, [facility] (Pharmacist) Benedetto Goad, RN as Case Manager  CHIEF COMPLAINT: Stage IIIc high-grade serous ovarian carcinoma  INTERVAL HISTORY: Patient returns to clinic today for further evaluation and consideration of cycle 3, day 1 of single agent gemcitabine.  She has a mild peripheral neuropathy that is chronic and unchanged, but otherwise feels well. She has no other neurologic complaints.  She denies any recent fevers or illnesses.  She has a good appetite and her weight has remained stable. She has no chest pain or shortness of breath. She denies any nausea, vomiting, constipation, or diarrhea.  She denies any abdominal pain or bloating.  She has no urinary complaints.  Patient offers no further specific complaints today.  REVIEW OF SYSTEMS:   Review of Systems  Constitutional: Negative.  Negative for fever, malaise/fatigue and weight loss.  HENT: Negative.  Negative for sore throat.   Respiratory: Negative.  Negative for cough and shortness of breath.   Cardiovascular: Negative.  Negative for chest pain and leg swelling.  Gastrointestinal: Negative.  Negative for abdominal pain, blood in stool, constipation, diarrhea, melena, nausea and vomiting.  Genitourinary: Negative.  Negative for dysuria.  Musculoskeletal: Positive for joint pain. Negative for back pain and neck pain.  Skin: Negative.  Negative for itching and rash.  Neurological: Positive for tingling and sensory change. Negative for focal weakness and weakness.  Psychiatric/Behavioral: Negative.  The patient is not nervous/anxious.     As per HPI. Otherwise, a  complete review of systems is negative.  PAST MEDICAL HISTORY: Past Medical History:  Diagnosis Date  . Allergic rhinitis, cause unspecified   . Anxiety state, unspecified   . Arthritis   . Asthma    only when sick   . Backache, unspecified   . Bronchitis    hx of when get sick  . Cancer ([place])    skin cancer , basal cell   . Cancer ([place]) 11/2016   ovarian  . Cervicalgia   . Complication of anesthesia   . Dermatophytosis of nail   . Dysmetabolic syndrome X   . Encounter for long-term (current) use of other medications   . Esophageal reflux   . Insomnia, unspecified   . Leukocytosis, unspecified   . Migraine without aura, without mention of intractable migraine without mention of status migrainosus   . Other and unspecified hyperlipidemia   . Other malaise and fatigue   . Overweight(278.02)   . Personal history of chemotherapy now   ovarian  . PONV (postoperative nausea and vomiting)   . Spinal stenosis in cervical region   . Symptomatic menopausal or female climacteric states   . Unspecified disorder of skin and subcutaneous tissue   . Unspecified vitamin D deficiency     PAST SURGICAL HISTORY: Past Surgical History:  Procedure Laterality Date  . ABDOMINAL HYSTERECTOMY    . ANTERIOR CERVICAL DECOMP/DISCECTOMY FUSION N/A 06/07/2015   Procedure: Cervical three - four and Cervical six- seven anterior cervical decompression with fusion interbody prosthesis plating and bonegraft;  Surgeon: Newman Pies, MD;  Location: [place];  Service: Neurosurgery;  Laterality: N/A;  C34 and C67 anterior cervical decompression with fusion interbody prosthesis plating and bonegraft  .  BACK SURGERY     x2 Lower   . EVACUATION OF CERVICAL HEMATOMA N/A 06/14/2015   Procedure: EVACUATION OF CERVICAL HEMATOMA;  Surgeon: Newman Pies, MD;  Location: [place] [facility];  Service: Neurosurgery;  Laterality: N/A;  . NECK SURGERY     x3  . TONSILLECTOMY      FAMILY HISTORY: Family History    Problem Relation Age of Onset  . Depression Mother   . Migraines Mother   . Dementia Father   . Diabetes Father   . Hyperlipidemia Father   . Hyperlipidemia Brother   . Hyperlipidemia Brother   . Breast cancer Paternal Aunt        87s    ADVANCED DIRECTIVES (Y/N):  N  HEALTH MAINTENANCE: Social History   Tobacco Use  . Smoking status: Former Smoker    Packs/day: 1.50    Years: 20.00    Pack years: 30.00    Types: Cigarettes    Start date: 03/19/1979    Last attempt to quit: 08/25/1999    Years since quitting: 18.6  . Smokeless tobacco: Never Used  . Tobacco comment: smoking cessation materials not required  Substance Use Topics  . Alcohol use: Not Currently    Alcohol/week: 0.0 standard drinks  . Drug use: No     Colonoscopy:  PAP:  Bone density:  Lipid panel:  No Known Allergies  Current Outpatient Medications  Medication Sig Dispense Refill  . albuterol (PROVENTIL HFA;VENTOLIN HFA) 108 (90 Base) MCG/ACT inhaler Inhale 2 puffs into the lungs every 6 (six) hours as needed for wheezing or shortness of breath. 1 Inhaler 2  . albuterol (PROVENTIL) (2.5 MG/3ML) 0.083% nebulizer solution Take 3 mLs (2.5 mg total) by nebulization every 6 (six) hours as needed for wheezing or shortness of breath. 75 mL 2  . Ascorbic Acid (VITAMIN C PO) Take 1 tablet by mouth daily.     . budesonide-formoterol (SYMBICORT) 160-4.5 MCG/ACT inhaler Inhale 2 puffs into the lungs 2 (two) times daily.    . Cholecalciferol (VITAMIN D-1000 MAX ST) 1000 UNITS tablet Take 1 tablet by mouth 2 (two) times daily.    . Cyanocobalamin (B-12 PO) Take 1 tablet by mouth daily.     . cyclobenzaprine (FLEXERIL) 10 MG tablet Take as directed 30 tablet 1  . DULoxetine (CYMBALTA) 60 MG capsule TAKE 1 CAPSULE(60 MG) BY MOUTH DAILY 180 capsule 1  . estradiol (ESTRACE) 0.5 MG tablet Take 1 tablet (0.5 mg total) by mouth daily. 90 tablet 4  . ferrous sulfate (IRON SUPPLEMENT) 325 (65 FE) MG tablet Take 1 tablet by  mouth daily.    . fluticasone (FLONASE) 50 MCG/ACT nasal spray Place 2 sprays into both nostrils as needed. 48 g 1  . gabapentin (NEURONTIN) 300 MG capsule Take 1-3 capsules (300-900 mg total) by mouth 3 (three) times daily. 300 mg twice a day and 900 mg at night 540 capsule 1  . ipratropium (ATROVENT HFA) 17 MCG/ACT inhaler Inhale 2 puffs into the lungs every 6 (six) hours as needed.     . lidocaine (XYLOCAINE) 2 % solution Use as directed 15 mLs in the mouth or throat as needed for mouth pain. 100 mL 0  . Lysine 1000 MG TABS Take 1 tablet by mouth daily.     . magic mouthwash w/lidocaine SOLN Take 5 mLs by mouth 3 (three) times daily. 315 mL 0  . Magnesium Oxide 500 MG CAPS Take 500 mg by mouth 2 (two) times daily.     Marland Kitchen  Multiple Vitamins-Minerals (MULTIVITAMIN PO) Take 1 tablet by mouth daily.     . Omega-3 Fatty Acids (FISH OIL PO) Take 1 capsule by mouth 2 (two) times daily.     Marland Kitchen oxyCODONE-acetaminophen (PERCOCET) 10-325 MG tablet Take 1 tablet by mouth every 6 (six) hours as needed for pain. 120 tablet 0  . potassium gluconate 595 MG TABS tablet Take 595 mg by mouth daily.    . SUMAtriptan (IMITREX) 100 MG tablet May repeat in 2 hours if headache persists or recurs. 27 tablet 0  . temazepam (RESTORIL) 15 MG capsule Take 1 capsule (15 mg total) by mouth at bedtime as needed for sleep. 90 capsule 0  . triamcinolone (KENALOG) 0.1 % paste Use as directed 1 application in the mouth or throat 2 (two) times daily. 5 g 2  . urea (CARMOL) 10 % cream Apply topically as needed. 410 g 1  . valACYclovir (VALTREX) 1000 MG tablet TAKE 1 TABLET(1000 MG) BY MOUTH THREE TIMES DAILY FOR 7 DAYS 21 tablet 0   No current facility-administered medications for this visit.    Facility-Administered Medications Ordered in Other Visits  Medication Dose Route Frequency Provider Last Rate Last Dose  . 0.9 %  sodium chloride infusion   Intravenous Once Lloyd Huger, MD      . 0.9 %  sodium chloride infusion    Intravenous Once Lloyd Huger, MD      . dexamethasone (DECADRON) 20 mg in sodium chloride 0.9 % 50 mL IVPB  20 mg Intravenous Once Lloyd Huger, MD      . dexamethasone (DECADRON) injection 10 mg  10 mg Intravenous Once Lloyd Huger, MD      . heparin lock flush 100 unit/mL  500 Units Intracatheter Once PRN Lloyd Huger, MD      . sodium chloride flush (NS) 0.9 % injection 10 mL  10 mL Intravenous PRN Lloyd Huger, MD   10 mL at 06/16/17 0854    OBJECTIVE: Vitals:   04/20/18 1027  BP: 134/78  Pulse: 88  Temp: (!) 97.3 F (36.3 C)     Body mass index is 29.72 kg/m.    ECOG FS:0 - Asymptomatic  General: Well-developed, well-nourished, no acute distress. Eyes: Pink conjunctiva, anicteric sclera. HEENT: Normocephalic, moist mucous membranes. Lungs: Clear to auscultation bilaterally. Heart: Regular rate and rhythm. No rubs, murmurs, or gallops. Abdomen: Soft, nontender, nondistended. No organomegaly noted, normoactive bowel sounds. Musculoskeletal: No edema, cyanosis, or clubbing. Neuro: Alert, answering all questions appropriately. Cranial nerves grossly intact. Skin: No rashes or petechiae noted. Psych: Normal affect.   LAB RESULTS:  Lab Results  Component Value Date   NA 137 04/20/2018   K 3.8 04/20/2018   CL 105 04/20/2018   CO2 26 04/20/2018   GLUCOSE 110 (H) 04/20/2018   BUN 17 04/20/2018   CREATININE 1.02 (H) 04/20/2018   CALCIUM 8.4 (L) 04/20/2018   PROT 6.6 04/20/2018   ALBUMIN 3.6 04/20/2018   AST 50 (H) 04/20/2018   ALT 63 (H) 04/20/2018   ALKPHOS 77 04/20/2018   BILITOT 0.3 04/20/2018   GFRNONAA 58 (L) 04/20/2018   GFRAA >60 04/20/2018    Lab Results  Component Value Date   WBC 4.5 04/20/2018   NEUTROABS 1.8 04/20/2018   HGB 10.8 (L) 04/20/2018   HCT 31.6 (L) 04/20/2018   MCV 99.7 04/20/2018   PLT 312 04/20/2018     STUDIES: No results found.  ONCOLOGY HISTORY: Patient underwent debulking surgery at  [place] [facility] on November 19, 2016. She received one infusion preoperative infusion of Keytruda on November 11, 2016 as part of a clinical trial.  She initiated adjuvant carboplatinum and Taxol on December 16, 2016.  This was continued through April 28, 2017 at which time Taxol was discontinued secondary to worsening neuropathy.  Patient then received single agent carboplatinum for additional 2 infusions.  She was then noted to have progression of disease and was switched to Avastin and doxorubicin on August 18, 2017 this was discontinued on November 24, 2017 secondary to declining performance status.  Palliative gemcitabine was initiated on February 20, 2018.   ASSESSMENT: Stage IIIc high-grade serous ovarian carcinoma  PLAN:  1. Stage IIIc high-grade serous ovarian carcinoma: See oncology history as above.  CT scans on February 02, 2018 reviewed independently with progression of disease.  CA-125 continues to decline and is now 68.5, today's result is pending.  Patient has inquired about intraperitoneal chemotherapy, but the benefit is unclear with increased toxicity.  Continue current treatment as planned.  Plan on using gemcitabine on days 1, 8, and 15 with a 22 off.  Because of patient's history of thrombocytopenia and neutropenia, gemcitabine has been dose reduced 10%.  Proceed with cycle 3, day 1 of treatment today.  Return to clinic in 1 week for further evaluation and consideration of cycle 3, day 8. 2. Pain: Well controlled.  Continue oxycodone as needed. 3. Anxiety: Chronic and unchanged.  Continue Xanax as prescribed. 4.  Peripheral neuropathy: Chronic and unchanged.  Continue gabapentin as prescribed.  Patient has agreed to acupuncture.   5.  Anemia: Patient's hemoglobin has trended down slightly to 10.8, monitor. 6.  Shoulder pain: Patient does not complain of this today.  Continue cortisone injections as needed.  Patient is also indicated she may need rotator cuff surgery in the future.  Okay to  proceed from an oncology standpoint, but will have to be coordinated with chemotherapy. 7.  Thrombocytopenia: Resolved.   8.  Neutropenia: Resolved. 9.  Elevated liver enzymes: Chronic and relatively unchanged, monitor.  Patient expressed understanding and was in agreement with this plan. She also understands that She can call clinic at any time with any questions, concerns, or complaints.   Cancer Staging Ovarian cancer, unspecified laterality ([place]) Staging form: Ovary, Fallopian Tube, and Primary Peritoneal Carcinoma, AJCC 8th Edition - Clinical: No stage assigned - Unsigned   Lloyd Huger, MD   04/20/2018 12:29 PM

## 2018-04-20 ENCOUNTER — Other Ambulatory Visit: Payer: Self-pay | Admitting: *Deleted

## 2018-04-20 ENCOUNTER — Inpatient Hospital Stay: Payer: PPO

## 2018-04-20 ENCOUNTER — Inpatient Hospital Stay (HOSPITAL_BASED_OUTPATIENT_CLINIC_OR_DEPARTMENT_OTHER): Payer: PPO | Admitting: Oncology

## 2018-04-20 ENCOUNTER — Other Ambulatory Visit: Payer: Self-pay

## 2018-04-20 ENCOUNTER — Encounter: Payer: Self-pay | Admitting: Oncology

## 2018-04-20 ENCOUNTER — Inpatient Hospital Stay: Payer: PPO | Attending: Oncology

## 2018-04-20 VITALS — BP 134/78 | HR 88 | Temp 97.3°F | Wt 162.5 lb

## 2018-04-20 DIAGNOSIS — Z79899 Other long term (current) drug therapy: Secondary | ICD-10-CM | POA: Diagnosis not present

## 2018-04-20 DIAGNOSIS — Z5111 Encounter for antineoplastic chemotherapy: Secondary | ICD-10-CM | POA: Diagnosis not present

## 2018-04-20 DIAGNOSIS — R748 Abnormal levels of other serum enzymes: Secondary | ICD-10-CM

## 2018-04-20 DIAGNOSIS — Z90722 Acquired absence of ovaries, bilateral: Secondary | ICD-10-CM

## 2018-04-20 DIAGNOSIS — R918 Other nonspecific abnormal finding of lung field: Secondary | ICD-10-CM | POA: Diagnosis not present

## 2018-04-20 DIAGNOSIS — C569 Malignant neoplasm of unspecified ovary: Secondary | ICD-10-CM | POA: Insufficient documentation

## 2018-04-20 DIAGNOSIS — Z87891 Personal history of nicotine dependence: Secondary | ICD-10-CM | POA: Insufficient documentation

## 2018-04-20 DIAGNOSIS — Z85828 Personal history of other malignant neoplasm of skin: Secondary | ICD-10-CM | POA: Diagnosis not present

## 2018-04-20 DIAGNOSIS — N898 Other specified noninflammatory disorders of vagina: Secondary | ICD-10-CM

## 2018-04-20 DIAGNOSIS — I10 Essential (primary) hypertension: Secondary | ICD-10-CM | POA: Insufficient documentation

## 2018-04-20 DIAGNOSIS — Z9221 Personal history of antineoplastic chemotherapy: Secondary | ICD-10-CM

## 2018-04-20 DIAGNOSIS — G629 Polyneuropathy, unspecified: Secondary | ICD-10-CM

## 2018-04-20 DIAGNOSIS — F419 Anxiety disorder, unspecified: Secondary | ICD-10-CM | POA: Insufficient documentation

## 2018-04-20 LAB — CBC WITH DIFFERENTIAL/PLATELET
Abs Immature Granulocytes: 0.02 10*3/uL (ref 0.00–0.07)
BASOS PCT: 1 %
Basophils Absolute: 0 10*3/uL (ref 0.0–0.1)
Eosinophils Absolute: 0.2 10*3/uL (ref 0.0–0.5)
Eosinophils Relative: 3 %
HCT: 31.6 % — ABNORMAL LOW (ref 36.0–46.0)
Hemoglobin: 10.8 g/dL — ABNORMAL LOW (ref 12.0–15.0)
Immature Granulocytes: 0 %
Lymphocytes Relative: 39 %
Lymphs Abs: 1.7 10*3/uL (ref 0.7–4.0)
MCH: 34.1 pg — ABNORMAL HIGH (ref 26.0–34.0)
MCHC: 34.2 g/dL (ref 30.0–36.0)
MCV: 99.7 fL (ref 80.0–100.0)
Monocytes Absolute: 0.8 10*3/uL (ref 0.1–1.0)
Monocytes Relative: 17 %
NEUTROS PCT: 40 %
Neutro Abs: 1.8 10*3/uL (ref 1.7–7.7)
Platelets: 312 10*3/uL (ref 150–400)
RBC: 3.17 MIL/uL — ABNORMAL LOW (ref 3.87–5.11)
RDW: 17.1 % — ABNORMAL HIGH (ref 11.5–15.5)
WBC: 4.5 10*3/uL (ref 4.0–10.5)
nRBC: 0 % (ref 0.0–0.2)

## 2018-04-20 LAB — COMPREHENSIVE METABOLIC PANEL
ALT: 63 U/L — ABNORMAL HIGH (ref 0–44)
ANION GAP: 6 (ref 5–15)
AST: 50 U/L — ABNORMAL HIGH (ref 15–41)
Albumin: 3.6 g/dL (ref 3.5–5.0)
Alkaline Phosphatase: 77 U/L (ref 38–126)
BUN: 17 mg/dL (ref 8–23)
CO2: 26 mmol/L (ref 22–32)
Calcium: 8.4 mg/dL — ABNORMAL LOW (ref 8.9–10.3)
Chloride: 105 mmol/L (ref 98–111)
Creatinine, Ser: 1.02 mg/dL — ABNORMAL HIGH (ref 0.44–1.00)
GFR calc Af Amer: >60 mL/min (ref >60)
GFR calc non Af Amer: 58 mL/min — ABNORMAL LOW (ref >60)
GLUCOSE: 110 mg/dL — AB (ref 70–99)
Potassium: 3.8 mmol/L (ref 3.5–5.1)
Sodium: 137 mmol/L (ref 135–145)
TOTAL PROTEIN: 6.6 g/dL (ref 6.5–8.1)
Total Bilirubin: 0.3 mg/dL (ref 0.3–1.2)

## 2018-04-20 LAB — PROTEIN, URINE, RANDOM: Total Protein, Urine: 7 mg/dL

## 2018-04-20 MED ORDER — SODIUM CHLORIDE 0.9 % IV SOLN
1600.0000 mg | Freq: Once | INTRAVENOUS | Status: AC
  Administered 2018-04-20: 1600 mg via INTRAVENOUS
  Filled 2018-04-20: qty 26.3

## 2018-04-20 MED ORDER — CYCLOBENZAPRINE HCL 10 MG PO TABS: ORAL_TABLET | ORAL | 1 refills | Status: DC

## 2018-04-20 MED ORDER — SODIUM CHLORIDE 0.9 % IV SOLN
Freq: Once | INTRAVENOUS | Status: AC
  Administered 2018-04-20: 11:00:00 via INTRAVENOUS
  Filled 2018-04-20: qty 250

## 2018-04-20 MED ORDER — PROCHLORPERAZINE MALEATE 10 MG PO TABS
10.0000 mg | ORAL_TABLET | Freq: Once | ORAL | Status: AC
  Administered 2018-04-20: 10 mg via ORAL
  Filled 2018-04-20: qty 1

## 2018-04-20 MED ORDER — HEPARIN SOD (PORK) LOCK FLUSH 100 UNIT/ML IV SOLN
500.0000 [IU] | Freq: Once | INTRAVENOUS | Status: AC | PRN
  Administered 2018-04-20: 500 [IU]
  Filled 2018-04-20: qty 5

## 2018-04-20 NOTE — Progress Notes (Signed)
Patient reports neuropathy in both feet and first 3 toes.

## 2018-04-21 LAB — CA 125: Cancer Antigen (CA) 125: 44.2 U/mL — ABNORMAL HIGH (ref 0.0–38.1)

## 2018-04-24 NOTE — Progress Notes (Signed)
Jessica Burgess  [phone] [phone]  ID: Jessica Burgess OB: Jul 18, 1955  MR#: [id number]  MEQ#:[id number]  Patient Care Team: Steele Sizer, MD as PCP - General (Family Medicine) Lloyd Huger, MD as Consulting Physician (Oncology) Mellody Drown, MD as Consulting Physician (Obstetrics and Gynecology) Cathi Roan, [facility] (Pharmacist) Benedetto Goad, RN as Case Manager  CHIEF COMPLAINT: Stage IIIc high-grade serous ovarian carcinoma  INTERVAL HISTORY: Patient returns to clinic today for further evaluation and consideration of cycle 3, day 8 of single agent gemcitabine.  She is tolerating her treatment well without significant side effects.  She continues to have a mild peripheral neuropathy  that is chronic and unchanged. She has no other neurologic complaints.  She denies any recent fevers or illnesses.  She has a good appetite and her weight has remained stable. She has no chest pain or shortness of breath. She denies any nausea, vomiting, constipation, or diarrhea.  She denies any abdominal pain or bloating.  She has no urinary complaints.  Patient offers no further specific complaints today.  REVIEW OF SYSTEMS:   Review of Systems  Constitutional: Negative.  Negative for fever, malaise/fatigue and weight loss.  HENT: Negative.  Negative for sore throat.   Respiratory: Negative.  Negative for cough and shortness of breath.   Cardiovascular: Negative.  Negative for chest pain and leg swelling.  Gastrointestinal: Negative.  Negative for abdominal pain, blood in stool, constipation, diarrhea, melena, nausea and vomiting.  Genitourinary: Negative.  Negative for dysuria.  Musculoskeletal: Positive for joint pain. Negative for back pain and neck pain.  Skin: Negative.  Negative for itching and rash.  Neurological: Positive for tingling and sensory change. Negative for focal weakness and weakness.  Psychiatric/Behavioral: Negative.  The  patient is not nervous/anxious.     As per HPI. Otherwise, a complete review of systems is negative.  PAST MEDICAL HISTORY: Past Medical History:  Diagnosis Date  . Allergic rhinitis, cause unspecified   . Anxiety state, unspecified   . Arthritis   . Asthma    only when sick   . Backache, unspecified   . Bronchitis    hx of when get sick  . Cancer ([place])    skin cancer , basal cell   . Cancer ([place]) 11/2016   ovarian  . Cervicalgia   . Complication of anesthesia   . Dermatophytosis of nail   . Dysmetabolic syndrome X   . Encounter for long-term (current) use of other medications   . Esophageal reflux   . Insomnia, unspecified   . Leukocytosis, unspecified   . Migraine without aura, without mention of intractable migraine without mention of status migrainosus   . Other and unspecified hyperlipidemia   . Other malaise and fatigue   . Overweight(278.02)   . Personal history of chemotherapy now   ovarian  . PONV (postoperative nausea and vomiting)   . Spinal stenosis in cervical region   . Symptomatic menopausal or female climacteric states   . Unspecified disorder of skin and subcutaneous tissue   . Unspecified vitamin D deficiency     PAST SURGICAL HISTORY: Past Surgical History:  Procedure Laterality Date  . ABDOMINAL HYSTERECTOMY    . ANTERIOR CERVICAL DECOMP/DISCECTOMY FUSION N/A 06/07/2015   Procedure: Cervical three - four and Cervical six- seven anterior cervical decompression with fusion interbody prosthesis plating and bonegraft;  Surgeon: Newman Pies, MD;  Location: [place] [facility];  Service: Neurosurgery;  Laterality: N/A;  C34 and C67 anterior cervical  decompression with fusion interbody prosthesis plating and bonegraft  . BACK SURGERY     x2 Lower   . EVACUATION OF CERVICAL HEMATOMA N/A 06/14/2015   Procedure: EVACUATION OF CERVICAL HEMATOMA;  Surgeon: Newman Pies, MD;  Location: [place] [facility];  Service: Neurosurgery;  Laterality: N/A;  . NECK SURGERY      x3  . TONSILLECTOMY      FAMILY HISTORY: Family History  Problem Relation Age of Onset  . Depression Mother   . Migraines Mother   . Dementia Father   . Diabetes Father   . Hyperlipidemia Father   . Hyperlipidemia Brother   . Hyperlipidemia Brother   . Breast cancer Paternal Aunt        16s    ADVANCED DIRECTIVES (Y/N):  N  HEALTH MAINTENANCE: Social History   Tobacco Use  . Smoking status: Former Smoker    Packs/day: 1.50    Years: 20.00    Pack years: 30.00    Types: Cigarettes    Start date: 03/19/1979    Last attempt to quit: 08/25/1999    Years since quitting: 18.6  . Smokeless tobacco: Never Used  . Tobacco comment: smoking cessation materials not required  Substance Use Topics  . Alcohol use: Not Currently    Alcohol/week: 0.0 standard drinks  . Drug use: No     Colonoscopy:  PAP:  Bone density:  Lipid panel:  No Known Allergies  Current Outpatient Medications  Medication Sig Dispense Refill  . albuterol (PROVENTIL HFA;VENTOLIN HFA) 108 (90 Base) MCG/ACT inhaler Inhale 2 puffs into the lungs every 6 (six) hours as needed for wheezing or shortness of breath. 1 Inhaler 2  . albuterol (PROVENTIL) (2.5 MG/3ML) 0.083% nebulizer solution Take 3 mLs (2.5 mg total) by nebulization every 6 (six) hours as needed for wheezing or shortness of breath. 75 mL 2  . Ascorbic Acid (VITAMIN C PO) Take 1 tablet by mouth daily.     . budesonide-formoterol (SYMBICORT) 160-4.5 MCG/ACT inhaler Inhale 2 puffs into the lungs 2 (two) times daily.    . Cholecalciferol (VITAMIN D-1000 MAX ST) 1000 UNITS tablet Take 1 tablet by mouth 2 (two) times daily.    . Cyanocobalamin (B-12 PO) Take 1 tablet by mouth daily.     . cyclobenzaprine (FLEXERIL) 10 MG tablet Take one tablet every 8 hours as needed 30 tablet 1  . DULoxetine (CYMBALTA) 60 MG capsule TAKE 1 CAPSULE(60 MG) BY MOUTH DAILY 180 capsule 1  . estradiol (ESTRACE) 0.5 MG tablet Take 1 tablet (0.5 mg total) by mouth daily. 90  tablet 4  . ferrous sulfate (IRON SUPPLEMENT) 325 (65 FE) MG tablet Take 1 tablet by mouth daily.    . fluticasone (FLONASE) 50 MCG/ACT nasal spray Place 2 sprays into both nostrils as needed. 48 g 1  . gabapentin (NEURONTIN) 300 MG capsule Take 1-3 capsules (300-900 mg total) by mouth 3 (three) times daily. 300 mg twice a day and 900 mg at night 540 capsule 1  . ipratropium (ATROVENT HFA) 17 MCG/ACT inhaler Inhale 2 puffs into the lungs every 6 (six) hours as needed.     . lidocaine (XYLOCAINE) 2 % solution Use as directed 15 mLs in the mouth or throat as needed for mouth pain. 100 mL 0  . Lysine 1000 MG TABS Take 1 tablet by mouth daily.     . Magnesium Oxide 500 MG CAPS Take 500 mg by mouth 2 (two) times daily.     . Multiple Vitamins-Minerals (  MULTIVITAMIN PO) Take 1 tablet by mouth daily.     . Omega-3 Fatty Acids (FISH OIL PO) Take 1 capsule by mouth 2 (two) times daily.     Marland Kitchen oxyCODONE-acetaminophen (PERCOCET) 10-325 MG tablet Take 1 tablet by mouth every 6 (six) hours as needed for pain. 120 tablet 0  . potassium gluconate 595 MG TABS tablet Take 595 mg by mouth daily.    . SUMAtriptan (IMITREX) 100 MG tablet May repeat in 2 hours if headache persists or recurs. 27 tablet 0  . temazepam (RESTORIL) 15 MG capsule Take 1 capsule (15 mg total) by mouth at bedtime as needed for sleep. 90 capsule 0  . triamcinolone (KENALOG) 0.1 % paste Use as directed 1 application in the mouth or throat 2 (two) times daily. 5 g 2  . urea (CARMOL) 10 % cream Apply topically as needed. 410 g 1  . valACYclovir (VALTREX) 1000 MG tablet TAKE 1 TABLET(1000 MG) BY MOUTH THREE TIMES DAILY FOR 7 DAYS 21 tablet 0  . magic mouthwash w/lidocaine SOLN Take 5 mLs by mouth 3 (three) times daily. (Patient not taking: Reported on 04/27/2018) 315 mL 0   No current facility-administered medications for this visit.    Facility-Administered Medications Ordered in Other Visits  Medication Dose Route Frequency Provider Last Rate  Last Dose  . 0.9 %  sodium chloride infusion   Intravenous Once Lloyd Huger, MD      . 0.9 %  sodium chloride infusion   Intravenous Once Lloyd Huger, MD      . dexamethasone (DECADRON) 20 mg in sodium chloride 0.9 % 50 mL IVPB  20 mg Intravenous Once Lloyd Huger, MD      . dexamethasone (DECADRON) injection 10 mg  10 mg Intravenous Once Lloyd Huger, MD      . sodium chloride flush (NS) 0.9 % injection 10 mL  10 mL Intravenous PRN Lloyd Huger, MD   10 mL at 06/16/17 0854    OBJECTIVE: Vitals:   04/27/18 1110  BP: (!) 141/90  Pulse: 80  Resp: 18  Temp: (!) 96.4 F (35.8 C)     Body mass index is 29.63 kg/m.    ECOG FS:0 - Asymptomatic  General: Well-developed, well-nourished, no acute distress. Eyes: Pink conjunctiva, anicteric sclera. HEENT: Normocephalic, moist mucous membranes. Lungs: Clear to auscultation bilaterally. Heart: Regular rate and rhythm. No rubs, murmurs, or gallops. Abdomen: Soft, nontender, nondistended. No organomegaly noted, normoactive bowel sounds. Musculoskeletal: No edema, cyanosis, or clubbing. Neuro: Alert, answering all questions appropriately. Cranial nerves grossly intact. Skin: No rashes or petechiae noted. Psych: Normal affect.  LAB RESULTS:  Lab Results  Component Value Date   NA 138 04/27/2018   K 3.7 04/27/2018   CL 102 04/27/2018   CO2 30 04/27/2018   GLUCOSE 89 04/27/2018   BUN 16 04/27/2018   CREATININE 0.89 04/27/2018   CALCIUM 8.9 04/27/2018   PROT 6.8 04/27/2018   ALBUMIN 3.8 04/27/2018   AST 160 (H) 04/27/2018   ALT 163 (H) 04/27/2018   ALKPHOS 84 04/27/2018   BILITOT 0.6 04/27/2018   GFRNONAA >60 04/27/2018   GFRAA >60 04/27/2018    Lab Results  Component Value Date   WBC 3.6 (L) 04/27/2018   NEUTROABS 1.3 (L) 04/27/2018   HGB 11.1 (L) 04/27/2018   HCT 32.6 (L) 04/27/2018   MCV 99.7 04/27/2018   PLT 254 04/27/2018     STUDIES: No results found.  ONCOLOGY HISTORY: Patient  underwent  debulking surgery at [facility] on November 19, 2016. She received one infusion preoperative infusion of Keytruda on November 11, 2016 as part of a clinical trial.  She initiated adjuvant carboplatinum and Taxol on December 16, 2016.  This was continued through April 28, 2017 at which time Taxol was discontinued secondary to worsening neuropathy.  Patient then received single agent carboplatinum for additional 2 infusions.  She was then noted to have progression of disease and was switched to Avastin and doxorubicin on August 18, 2017 this was discontinued on November 24, 2017 secondary to declining performance status.  Palliative gemcitabine was initiated on February 20, 2018.   ASSESSMENT: Stage IIIc high-grade serous ovarian carcinoma  PLAN:  1. Stage IIIc high-grade serous ovarian carcinoma: See oncology history as above.  CT scans on February 02, 2018 reviewed independently with progression of disease.  CA-125 initially decreased to 44.2, but now has trended up slightly to 52.9.  Patient has inquired about intraperitoneal chemotherapy, but the benefit is unclear with increased toxicity.  Continue current treatment as planned.  Plan on using gemcitabine on days 1, 8, and 15 with a 22 off.  Because of patient's history of thrombocytopenia and neutropenia, gemcitabine has been dose reduced 10%.  Proceed with cycle 3, day 8 of treatment today.  Return to clinic in 1 week for further evaluation and consideration of cycle 3, day 15.  2. Pain: Well controlled.  Continue oxycodone as needed. 3. Anxiety: Chronic and unchanged.  Continue Xanax as prescribed. 4.  Peripheral neuropathy: Chronic and unchanged.  Continue gabapentin as prescribed.  Patient has agreed to acupuncture.   5.  Anemia: Patient's hemoglobin has trended down slightly to 10.8, monitor. 6.  Shoulder pain: Patient does not complain of this today.  Continue cortisone injections as needed.  Patient is also indicated she may need  rotator cuff surgery in the future.  Okay to proceed from an oncology standpoint, but will have to be coordinated with chemotherapy. 7.  Thrombocytopenia: Resolved.   8.  Neutropenia: Mild, proceed with treatment as above. 9.  Elevated liver enzymes: AST and ALT slightly worse today.  Monitor closely.    Patient expressed understanding and was in agreement with this plan. She also understands that She can call clinic at any time with any questions, concerns, or complaints.   Cancer Staging Ovarian cancer, unspecified laterality ([place]) Staging form: Ovary, Fallopian Tube, and Primary Peritoneal Carcinoma, AJCC 8th Edition - Clinical: No stage assigned - Unsigned   Lloyd Huger, MD   04/28/2018 6:18 AM

## 2018-04-27 ENCOUNTER — Inpatient Hospital Stay (HOSPITAL_BASED_OUTPATIENT_CLINIC_OR_DEPARTMENT_OTHER): Payer: PPO | Admitting: Oncology

## 2018-04-27 ENCOUNTER — Inpatient Hospital Stay: Payer: PPO

## 2018-04-27 VITALS — BP 141/90 | HR 80 | Temp 96.4°F | Resp 18 | Wt 162.0 lb

## 2018-04-27 DIAGNOSIS — I10 Essential (primary) hypertension: Secondary | ICD-10-CM

## 2018-04-27 DIAGNOSIS — C569 Malignant neoplasm of unspecified ovary: Secondary | ICD-10-CM

## 2018-04-27 DIAGNOSIS — Z90722 Acquired absence of ovaries, bilateral: Secondary | ICD-10-CM | POA: Diagnosis not present

## 2018-04-27 DIAGNOSIS — Z85828 Personal history of other malignant neoplasm of skin: Secondary | ICD-10-CM | POA: Diagnosis not present

## 2018-04-27 DIAGNOSIS — Z87891 Personal history of nicotine dependence: Secondary | ICD-10-CM

## 2018-04-27 DIAGNOSIS — Z9221 Personal history of antineoplastic chemotherapy: Secondary | ICD-10-CM | POA: Diagnosis not present

## 2018-04-27 DIAGNOSIS — Z79899 Other long term (current) drug therapy: Secondary | ICD-10-CM | POA: Diagnosis not present

## 2018-04-27 DIAGNOSIS — N898 Other specified noninflammatory disorders of vagina: Secondary | ICD-10-CM | POA: Diagnosis not present

## 2018-04-27 DIAGNOSIS — R748 Abnormal levels of other serum enzymes: Secondary | ICD-10-CM

## 2018-04-27 DIAGNOSIS — F419 Anxiety disorder, unspecified: Secondary | ICD-10-CM | POA: Diagnosis not present

## 2018-04-27 DIAGNOSIS — Z5111 Encounter for antineoplastic chemotherapy: Secondary | ICD-10-CM | POA: Diagnosis not present

## 2018-04-27 DIAGNOSIS — R918 Other nonspecific abnormal finding of lung field: Secondary | ICD-10-CM | POA: Diagnosis not present

## 2018-04-27 DIAGNOSIS — G629 Polyneuropathy, unspecified: Secondary | ICD-10-CM

## 2018-04-27 LAB — COMPREHENSIVE METABOLIC PANEL
ALBUMIN: 3.8 g/dL (ref 3.5–5.0)
ALT: 163 U/L — AB (ref 0–44)
AST: 160 U/L — AB (ref 15–41)
Alkaline Phosphatase: 84 U/L (ref 38–126)
Anion gap: 6 (ref 5–15)
BUN: 16 mg/dL (ref 8–23)
CO2: 30 mmol/L (ref 22–32)
Calcium: 8.9 mg/dL (ref 8.9–10.3)
Chloride: 102 mmol/L (ref 98–111)
Creatinine, Ser: 0.89 mg/dL (ref 0.44–1.00)
GFR calc Af Amer: >60 mL/min (ref >60)
GFR calc non Af Amer: >60 mL/min (ref >60)
GLUCOSE: 89 mg/dL (ref 70–99)
Potassium: 3.7 mmol/L (ref 3.5–5.1)
Sodium: 138 mmol/L (ref 135–145)
Total Bilirubin: 0.6 mg/dL (ref 0.3–1.2)
Total Protein: 6.8 g/dL (ref 6.5–8.1)

## 2018-04-27 LAB — CBC WITH DIFFERENTIAL/PLATELET
Abs Immature Granulocytes: 0.13 10*3/uL — ABNORMAL HIGH (ref 0.00–0.07)
Basophils Absolute: 0 10*3/uL (ref 0.0–0.1)
Basophils Relative: 1 %
Eosinophils Absolute: 0 10*3/uL (ref 0.0–0.5)
Eosinophils Relative: 1 %
HCT: 32.6 % — ABNORMAL LOW (ref 36.0–46.0)
HEMOGLOBIN: 11.1 g/dL — AB (ref 12.0–15.0)
Immature Granulocytes: 4 %
Lymphocytes Relative: 42 %
Lymphs Abs: 1.6 10*3/uL (ref 0.7–4.0)
MCH: 33.9 pg (ref 26.0–34.0)
MCHC: 34 g/dL (ref 30.0–36.0)
MCV: 99.7 fL (ref 80.0–100.0)
Monocytes Absolute: 0.5 10*3/uL (ref 0.1–1.0)
Monocytes Relative: 15 %
Neutro Abs: 1.3 10*3/uL — ABNORMAL LOW (ref 1.7–7.7)
Neutrophils Relative %: 37 %
Platelets: 254 10*3/uL (ref 150–400)
RBC: 3.27 MIL/uL — ABNORMAL LOW (ref 3.87–5.11)
RDW: 16.1 % — ABNORMAL HIGH (ref 11.5–15.5)
WBC: 3.6 10*3/uL — ABNORMAL LOW (ref 4.0–10.5)
nRBC: 1.9 % — ABNORMAL HIGH (ref 0.0–0.2)

## 2018-04-27 MED ORDER — SODIUM CHLORIDE 0.9% FLUSH
10.0000 mL | Freq: Once | INTRAVENOUS | Status: AC
  Administered 2018-04-27: 10 mL via INTRAVENOUS
  Filled 2018-04-27: qty 10

## 2018-04-27 MED ORDER — SODIUM CHLORIDE 0.9 % IV SOLN
Freq: Once | INTRAVENOUS | Status: AC
  Administered 2018-04-27: 12:00:00 via INTRAVENOUS
  Filled 2018-04-27: qty 250

## 2018-04-27 MED ORDER — SODIUM CHLORIDE 0.9 % IV SOLN
1600.0000 mg | Freq: Once | INTRAVENOUS | Status: AC
  Administered 2018-04-27: 1600 mg via INTRAVENOUS
  Filled 2018-04-27: qty 26.3

## 2018-04-27 MED ORDER — PROCHLORPERAZINE MALEATE 10 MG PO TABS
10.0000 mg | ORAL_TABLET | Freq: Once | ORAL | Status: AC
  Administered 2018-04-27: 10 mg via ORAL
  Filled 2018-04-27: qty 1

## 2018-04-27 MED ORDER — HEPARIN SOD (PORK) LOCK FLUSH 100 UNIT/ML IV SOLN
500.0000 [IU] | Freq: Once | INTRAVENOUS | Status: AC
  Administered 2018-04-27: 500 [IU] via INTRAVENOUS
  Filled 2018-04-27: qty 5

## 2018-04-28 LAB — CA 125: Cancer Antigen (CA) 125: 52.9 U/mL — ABNORMAL HIGH (ref 0.0–38.1)

## 2018-04-29 ENCOUNTER — Other Ambulatory Visit: Payer: Self-pay

## 2018-04-29 ENCOUNTER — Inpatient Hospital Stay (HOSPITAL_BASED_OUTPATIENT_CLINIC_OR_DEPARTMENT_OTHER): Payer: PPO | Admitting: Obstetrics and Gynecology

## 2018-04-29 VITALS — BP 132/73 | HR 87 | Temp 96.0°F | Resp 18 | Wt 164.0 lb

## 2018-04-29 DIAGNOSIS — C569 Malignant neoplasm of unspecified ovary: Secondary | ICD-10-CM

## 2018-04-29 DIAGNOSIS — N898 Other specified noninflammatory disorders of vagina: Secondary | ICD-10-CM | POA: Diagnosis not present

## 2018-04-29 DIAGNOSIS — Z90722 Acquired absence of ovaries, bilateral: Secondary | ICD-10-CM | POA: Diagnosis not present

## 2018-04-29 DIAGNOSIS — Z5111 Encounter for antineoplastic chemotherapy: Secondary | ICD-10-CM | POA: Diagnosis not present

## 2018-04-29 DIAGNOSIS — Z9221 Personal history of antineoplastic chemotherapy: Secondary | ICD-10-CM

## 2018-04-29 DIAGNOSIS — Z90710 Acquired absence of both cervix and uterus: Secondary | ICD-10-CM

## 2018-04-29 LAB — WET PREP, GENITAL
CLUE CELLS WET PREP: NONE SEEN
Sperm: NONE SEEN
Trich, Wet Prep: NONE SEEN
Yeast Wet Prep HPF POC: NONE SEEN

## 2018-04-29 NOTE — Progress Notes (Signed)
Here for follow up. Overall " I feel blessed-my numbers are better "

## 2018-04-29 NOTE — Progress Notes (Signed)
Gynecologic Oncology Interval Visit   Referring Provider: Dr. Grayland Ormond  Chief Concern: stage IIIC ovarian cancer  Subjective:  Jessica Burgess is a 63 y.o. female, initially seen in consultation for Dr. Grayland Ormond, diagnosed with platinum-resistant high grade serous ovarian cancer who returns to clinic today for follow up.   Since she was last seen she started on single agent gemcitabine on 02/19/2018 and is status post day 8 cycle 3 on 04/27/2018.   CT A/P 02/02/2018 prior to starting gemcitabine IMPRESSION: 1. Mild increase in nodularity along the left upper quadrant omentum compatible with mild progression. 2. Prior nodularity along inferior pulmonary ligament has resolved and is thought to of been benign. Chronic right middle lobe subpleural nodule likewise probably benign although merit surveillance.   CA-125 has been followed as below.   253.0 02/02/2018 300 02/19/2018 301 02/26/2018 201 03/05/2018 74.9 03/23/2018 73.3  03/30/2018 68.5 04/06/2018 44.2 04/20/2018 52.9 04/27/2018  Today, she reports vaginal discharge. Denies pain or bleeding. She continues to feel weak and tired.    Gynecologic Oncology History:  Jessica Burgess initially presented to Dr. Grayland Ormond on 10/21/16 with several month history of LLQ pain.    10/25/16:   CA-125 of 1,902; CEA of 2.2 (N), repeat CA125 3,257.  10/25/16:  CTAP -- Omental caking, greatest within the pelvis, highly suspicious for metastatic disease. No definite primary tumor identified. Possible tiny peritoneal nodules. Small amount of free pelvic fluid.  11/05/16: Diagnostic laparoscopy with biopsies.  Fagotti score 4. Path revealed metastatic high grade serous carcinoma. Decision was made to proceed with [employer] study and primary debulking surgery.  11/11/16: Pre-surgical dose of Pembrolizumab 256m IV   11/19/16: Exploratory laparotomy, bilateral salpingo-oopherectomy, omentectomy, tumor debulking, liver moblization, and argon beam ablation  of tumor nodules on the right diaphragm and posterior cul-de-sac.  At the completion of the case, optimal debulking was achieved with all tumor nodules less than 1 cm in diameter.  Final pathology confirmed high grade serous adenocarcinoma involving the omentum, bilateral tubes, pelvic peritoneum, sigmoid nodule.  STIC in the right fallopian tube.   12/01/2016 Post op course complicated by abdominal wall seroma.  CT shows new moderate amount of free fluid within the pelvis. There is no loculated or encapsulated fluid collection. There are multiple small fluid collections in the anterior subcutaneous tissues. Representative areas measure 2.0 x 5.5 cm (series 2, image 71) and 2.2 x 3.4 cm (series 2, image 60).   Wound separation on 12/01/16 in ER_wet to dry dressing_healing via secondary intention.   12/05/2016- mediport placed  She was also initiated on a clinical trial and has received preoperative infusion of Keytruda on November 11, 2016 and then started carbo/taxol.  Because of patient's persistent peripheral neuropathy as well as pancytopenia, Taxol was discontinued on April 28, 2017.  CT scan from June 19, 2017 essentially unchanged from previous scan in January with some residual carcinomatosis and CA125 still elevated. Stopped after 9 cycles in May (last 2 cycles with single agent Carbo).    CA 125 49.7     06/23/17 136.9   07/21/17 179.7   07/31/17 179.0 09/01/17  Then switched to Doxil/Avastin in June 2019.  CA 125 114.0 11/10/17 122.0 11/21/17 105.0 12/08/17 129.0 01/07/18  Doxil/Avastin was discontinued due to declining performance status and toxicity. She has had mucositis, hand/foot, peripheral neuropathy, anorexia, vaginal discharge with retained cervix, and recent ER visit for pneumonia which prompted chemotherapy holiday.   She reported abdominal pains intermittently with continued vaginal discharge and spotting  when seen in clinic on 01/28/2018 with Dr. Fransisca Connors. Discussed GY005  trial with cediranib and olaparib vs gemcitabine. She opted for gemcitabine.   Genetic Risk Assessment:  Genetic testing with [facility] MyRisk negative.  [employer] MyChoice was negative for BRCA1 or BRCA2 mutations in tumor. Myriad Genomic Instability- negative  Problem List: Patient Active Problem List   Diagnosis Date Noted  . 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 Mucositis due to chemotherapy 09/02/2017  . Goals of care, counseling/discussion 08/08/2017  . Hypothyroidism due to medication 07/28/2017  . Genetic testing 12/18/2016  . Migraines 12/01/2016  . Postoperative seroma of subcutaneous tissue after non-dermatologic procedure 12/01/2016  . Transaminitis 12/01/2016  . Anemia associated with acute blood loss 11/20/2016  . Ovarian cancer, unspecified laterality ([place]) 11/12/2016  . 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[place]) 11/05/2016  . Examination of participant in clinical trial 11/01/2016  . Chronic right-sided low back pain with right-sided sciatica 02/02/2016  . History of neck surgery 08/01/2015  . Hematoma 06/14/2015  . Perennial allergic rhinitis 05/05/2015  . Generalized anxiety disorder 02/03/2015  . Back pain, chronic 08/25/2014  . Insomnia, persistent 08/25/2014  . Chronic cervical pain 08/25/2014  . Major depression, chronic ([place]) 08/25/2014  . Dyslipidemia 08/25/2014  . Gastro-esophageal reflux disease without esophagitis 08/25/2014  . H/O high risk medication treatment 08/25/2014  . Blood glucose elevated 08/25/2014  . Migraine without aura and without status migrainosus, not intractable 08/25/2014  . Climacteric 08/25/2014  . Dysmetabolic syndrome 68/10/8108  . Fungal infection of toenail 08/25/2014  . Obesity (BMI 30.0-34.9) 08/25/2014  . Vitamin D deficiency 08/25/2014  . Engages in travel abroad 08/25/2014  . Cervical disc disorder with radiculopathy 04/28/2013   Past Medical History: Past Medical History:  Diagnosis Date  . Allergic  rhinitis, cause unspecified   . Anxiety state, unspecified   . Arthritis   . Asthma    only when sick   . Backache, unspecified   . Bronchitis    hx of when get sick  . Cancer ([place])    skin cancer , basal cell   . Cancer ([place]) 11/2016   ovarian  . Cervicalgia   . Complication of anesthesia   . Dermatophytosis of nail   . Dysmetabolic syndrome X   . Encounter for long-term (current) use of other medications   . Esophageal reflux   . Insomnia, unspecified   . Leukocytosis, unspecified   . Migraine without aura, without mention of intractable migraine without mention of status migrainosus   . Other and unspecified hyperlipidemia   . Other malaise and fatigue   . Overweight(278.02)   . Personal history of chemotherapy now   ovarian  . PONV (postoperative nausea and vomiting)   . Spinal stenosis in cervical region   . Symptomatic menopausal or female climacteric states   . Unspecified disorder of skin and subcutaneous tissue   . Unspecified vitamin D deficiency    Past Surgical History: Past Surgical History:  Procedure Laterality Date  . ABDOMINAL HYSTERECTOMY    . ANTERIOR CERVICAL DECOMP/DISCECTOMY FUSION N/A 06/07/2015   Procedure: Cervical three - four and Cervical six- seven anterior cervical decompression with fusion interbody prosthesis plating and bonegraft;  Surgeon: Newman Pies, MD;  Location: [facility];  Service: Neurosurgery;  Laterality: N/A;  C34 and C67 anterior cervical decompression with fusion interbody prosthesis plating and bonegraft  . BACK SURGERY     x2 Lower   . EVACUATION OF CERVICAL HEMATOMA N/A 06/14/2015   Procedure:  EVACUATION OF CERVICAL HEMATOMA;  Surgeon: Newman Pies, MD;  Location: [facility];  Service: Neurosurgery;  Laterality: N/A;  . NECK SURGERY     x3  . TONSILLECTOMY     OB History:  OB History  Gravida Para Term Preterm AB Living  0            SAB TAB Ectopic Multiple Live Births              Family History: Family  History  Problem Relation Age of Onset  . Depression Mother   . Migraines Mother   . Dementia Father   . Diabetes Father   . Hyperlipidemia Father   . Hyperlipidemia Brother   . Hyperlipidemia Brother   . Breast cancer Paternal Aunt        39s   Social History: Social History   Socioeconomic History  . Marital status: Single    Spouse name: Not on file  . Number of children: 0  . Years of education: some college  . Highest education level: 12th grade  Occupational History    Employer: DISABLED  . Occupation: Disabled   Social Needs  . Financial resource strain: Very hard  . Food insecurity:    Worry: Never true    Inability: Never true  . Transportation needs:    Medical: No    Non-medical: No  Tobacco Use  . Smoking status: Former Smoker    Packs/day: 1.50    Years: 20.00    Pack years: 30.00    Types: Cigarettes    Start date: 03/19/1979    Last attempt to quit: 08/25/1999    Years since quitting: 18.6  . Smokeless tobacco: Never Used  . Tobacco comment: smoking cessation materials not required  Substance and Sexual Activity  . Alcohol use: Not Currently    Alcohol/week: 0.0 standard drinks  . Drug use: No  . Sexual activity: Never  Lifestyle  . Physical activity:    Days per week: 0 days    Minutes per session: 0 min  . Stress: Very much  Relationships  . Social connections:    Talks on phone: Patient refused    Gets together: Patient refused    Attends religious service: Patient refused    Active member of club or organization: Patient refused    Attends meetings of clubs or organizations: Patient refused    Relationship status: Patient refused  . Intimate partner violence:    Fear of current or ex partner: No    Emotionally abused: No    Physically abused: No    Forced sexual activity: No  Other Topics Concern  . Not on file  Social History Narrative   Patient is single.    Patient lives with roommates.    Patient on disability    Patient has  no children.    Patient has some college     Allergies: No Known Allergies  Current Medications: Current Outpatient Medications  Medication Sig Dispense Refill  . Ascorbic Acid (VITAMIN C PO) Take 1 tablet by mouth daily.     . budesonide-formoterol (SYMBICORT) 160-4.5 MCG/ACT inhaler Inhale 2 puffs into the lungs 2 (two) times daily.    . Cholecalciferol (VITAMIN D-1000 MAX ST) 1000 UNITS tablet Take 1 tablet by mouth 2 (two) times daily.    . Cyanocobalamin (B-12 PO) Take 1 tablet by mouth daily.     . cyclobenzaprine (FLEXERIL) 10 MG tablet Take one tablet every 8 hours as needed 30  tablet 1  . DULoxetine (CYMBALTA) 60 MG capsule TAKE 1 CAPSULE(60 MG) BY MOUTH DAILY 180 capsule 1  . estradiol (ESTRACE) 0.5 MG tablet Take 1 tablet (0.5 mg total) by mouth daily. 90 tablet 4  . ferrous sulfate (IRON SUPPLEMENT) 325 (65 FE) MG tablet Take 1 tablet by mouth daily.    . fluticasone (FLONASE) 50 MCG/ACT nasal spray Place 2 sprays into both nostrils as needed. 48 g 1  . gabapentin (NEURONTIN) 300 MG capsule Take 1-3 capsules (300-900 mg total) by mouth 3 (three) times daily. 300 mg twice a day and 900 mg at night 540 capsule 1  . ipratropium (ATROVENT HFA) 17 MCG/ACT inhaler Inhale 2 puffs into the lungs every 6 (six) hours as needed.     . Magnesium Oxide 500 MG CAPS Take 500 mg by mouth 2 (two) times daily.     . Multiple Vitamins-Minerals (MULTIVITAMIN PO) Take 1 tablet by mouth daily.     . Omega-3 Fatty Acids (FISH OIL PO) Take 1 capsule by mouth 2 (two) times daily.     Marland Kitchen oxyCODONE-acetaminophen (PERCOCET) 10-325 MG tablet Take 1 tablet by mouth every 6 (six) hours as needed for pain. 120 tablet 0  . potassium gluconate 595 MG TABS tablet Take 595 mg by mouth daily.    Marland Kitchen albuterol (PROVENTIL HFA;VENTOLIN HFA) 108 (90 Base) MCG/ACT inhaler Inhale 2 puffs into the lungs every 6 (six) hours as needed for wheezing or shortness of breath. (Patient not taking: Reported on 04/29/2018) 1 Inhaler 2   . albuterol (PROVENTIL) (2.5 MG/3ML) 0.083% nebulizer solution Take 3 mLs (2.5 mg total) by nebulization every 6 (six) hours as needed for wheezing or shortness of breath. (Patient not taking: Reported on 04/29/2018) 75 mL 2  . lidocaine (XYLOCAINE) 2 % solution Use as directed 15 mLs in the mouth or throat as needed for mouth pain. (Patient not taking: Reported on 04/29/2018) 100 mL 0  . Lysine 1000 MG TABS Take 1 tablet by mouth daily.     . magic mouthwash w/lidocaine SOLN Take 5 mLs by mouth 3 (three) times daily. (Patient not taking: Reported on 04/29/2018) 315 mL 0  . SUMAtriptan (IMITREX) 100 MG tablet May repeat in 2 hours if headache persists or recurs. (Patient not taking: Reported on 04/29/2018) 27 tablet 0  . temazepam (RESTORIL) 15 MG capsule Take 1 capsule (15 mg total) by mouth at bedtime as needed for sleep. (Patient not taking: Reported on 04/29/2018) 90 capsule 0  . triamcinolone (KENALOG) 0.1 % paste Use as directed 1 application in the mouth or throat 2 (two) times daily. (Patient not taking: Reported on 04/29/2018) 5 g 2  . urea (CARMOL) 10 % cream Apply topically as needed. (Patient not taking: Reported on 04/29/2018) 410 g 1  . valACYclovir (VALTREX) 1000 MG tablet TAKE 1 TABLET(1000 MG) BY MOUTH THREE TIMES DAILY FOR 7 DAYS (Patient not taking: Reported on 04/29/2018) 21 tablet 0   No current facility-administered medications for this visit.    Facility-Administered Medications Ordered in Other Visits  Medication Dose Route Frequency Provider Last Rate Last Dose  . 0.9 %  sodium chloride infusion   Intravenous Once Lloyd Huger, MD      . 0.9 %  sodium chloride infusion   Intravenous Once Lloyd Huger, MD      . dexamethasone (DECADRON) 20 mg in sodium chloride 0.9 % 50 mL IVPB  20 mg Intravenous Once Lloyd Huger, MD      .  dexamethasone (DECADRON) injection 10 mg  10 mg Intravenous Once Lloyd Huger, MD      . sodium chloride flush (NS) 0.9 %  injection 10 mL  10 mL Intravenous PRN Lloyd Huger, MD   10 mL at 06/16/17 5956    Review of Systems General: Fatigue and weakness Skin: no complaints Eyes: no complaints HEENT: no complaints Breasts: no complaints Pulmonary: no complaints Cardiac: no complaints Gastrointestinal: Nausea, constipation, Genitourinary/Sexual: no complaints Ob/Gyn: Per HPI Musculoskeletal: Back pain (chronic) Hematology: no complaints Neurologic/Psych: Numbness and tingling (chronic)   Objective:  Physical Examination:  BP 132/73 (BP Location: Left Arm, Patient Position: Sitting)   Pulse 87   Temp (!) 96 F (35.6 C) (Tympanic)   Resp 18   Wt 164 lb (74.4 kg)   BMI 30.00 kg/m   ECOG Performance Status: 1 - Symptomatic but completely ambulatory  GENERAL: moderately built, fatigued appearing female. Appears stated age.  HEENT:  Sclera clear. Anicteric NODES:  Negative axillary, supraclavicular, inguinal lymph node survery LUNGS:  Clear to auscultation bilaterally HEART:  Regular rate and rhythm ABDOMEN:  Soft, nontender.  No hernias, incisions well healed. No masses or ascites EXTREMITIES:  No peripheral edema. Atraumatic. No cyanosis SKIN:  Clear with no obvious rashes or skin changes NEURO:  Nonfocal. Well oriented.  Appropriate affect  Pelvic: Exam chaperoned by CMA: Vulva-normal-appearing without masses, tenderness or lesions.  Vagina: Normal vagina without discharge: Cervix: Normal appearing.  Bimanual/RV: cervix palpated, in addition nodularity noted involving left paracervical tissue and on the right there is soft tissue measuring 2-3 cm but it is difficult to determine if this is due to scarring or disease.      Assessment:  Jessica Burgess is a 63 y.o. female diagnosed with IIIC high grade serous ovarian cancer, s/p diagnostic laparoscopy and omental biopsy on 11/05/16. Single infusion of Pembrolizumab 267m 11/11/16 on [employer] trial.  Optimal tumor debulking 11/19/16 and  then [facility](last two cycles with [place].  Had persistent disease based on CT scan and CA125 at completion of primary therapy.  Transitioned to Doxil/avastin due to poor PS and toxicity. Gemcitabine initiated on 02/19/2018 due to progressive disease with evidence of response based on CA125. Exam today is concerning for progression.    [facility] for mutations. Genetic testing with [facility] panel negative for BRCA1/2 mutations or HRD.  Vaginal discharge, physiologic versus other causes such as BV or candidiasis.   Medical co-morbidities complicating care: hyperlipidemia, extensive spinal surgery, anxiety/depression  .  Plan:   Problem List Items Addressed This Visit      Endocrine   Ovarian cancer, unspecified laterality ([place]     I spoke with Dr FGrayland Ormondregarding imaging and recommended PET/CT given the challenges with her exam. If she has progressive disease she may be a candidate for a clinical trial.   We are happy to see her back in about 3 months or sooner if needed.   We also sent a Wet Prep for further evaluation of her discharge.    LBeckey Rutter DNP, AGNP-C CDearbornat [phone](work cell) [phone](office)  I personally had a face to face interaction and evaluated the patient jointly with the NP, Ms. LBeckey Rutter  I have reviewed her history and available records and have performed the key portions of the physical exam including general, HEENT, abdominal exam, pelvic exam with my findings confirming those documented above by  the APP.  I have discussed the case with the APP and the patient.  I agree with the above documentation, assessment and plan which was fully formulated by me.  Counseling was completed by me.   I personally saw the patient and performed a substantive portion of this encounter in conjunction with the listed APP as documented above.  A total of 25  minutes were spent with the patient/family today; >50% was spent in education, counseling and coordination of care for ovarian cancer.  Angeles Gaetana Michaelis, MD   CC:  Dr. Grayland Ormond

## 2018-05-03 NOTE — Progress Notes (Signed)
[place]  [phone] [phone]) [phone]  ID: Jessica Burgess OB: 1955/05/02  MR#: [id number]  GYI#:[id number]  Patient Care Team: Steele Sizer, MD as PCP - General (Family Medicine) Lloyd Huger, MD as Consulting Physician (Oncology) Mellody Drown, MD as Consulting Physician (Obstetrics and Gynecology) Cathi Roan, [facility] (Pharmacist) Benedetto Goad, RN as Case Manager Clent Jacks, RN as Registered Nurse  CHIEF COMPLAINT: Stage IIIc high-grade serous ovarian carcinoma  INTERVAL HISTORY: Patient returns to clinic today for further evaluation and consideration of cycle 3, day 15 of single agent gemcitabine.  Patient has noticed increasing weakness and fatigue since her last treatment. She continues to have a mild peripheral neuropathy  that is chronic and unchanged. She has no other neurologic complaints.  She denies any recent fevers or illnesses.  She has a good appetite and her weight has remained stable. She has no chest pain or shortness of breath. She denies any nausea, vomiting, constipation, or diarrhea.  She denies any abdominal pain or bloating.  She has no urinary complaints.  Patient offers no further specific complaints today.  REVIEW OF SYSTEMS:   Review of Systems  Constitutional: Positive for malaise/fatigue. Negative for fever and weight loss.  HENT: Negative.  Negative for sore throat.   Respiratory: Negative.  Negative for cough and shortness of breath.   Cardiovascular: Negative.  Negative for chest pain and leg swelling.  Gastrointestinal: Negative.  Negative for abdominal pain, blood in stool, constipation, diarrhea, melena, nausea and vomiting.  Genitourinary: Negative.  Negative for dysuria.  Musculoskeletal: Positive for joint pain. Negative for back pain and neck pain.  Skin: Negative.  Negative for itching and rash.  Neurological: Positive for tingling, sensory change and weakness. Negative for focal  weakness.  Psychiatric/Behavioral: Negative.  The patient is not nervous/anxious.     As per HPI. Otherwise, a complete review of systems is negative.  PAST MEDICAL HISTORY: Past Medical History:  Diagnosis Date  . Allergic rhinitis, cause unspecified   . Anxiety state, unspecified   . Arthritis   . Asthma    only when sick   . Backache, unspecified   . Bronchitis    hx of when get sick  . Cancer ([place])    skin cancer , basal cell   . Cancer ([place]) 11/2016   ovarian  . Cervicalgia   . Complication of anesthesia   . Dermatophytosis of nail   . Dysmetabolic syndrome X   . Encounter for long-term (current) use of other medications   . Esophageal reflux   . Insomnia, unspecified   . Leukocytosis, unspecified   . Migraine without aura, without mention of intractable migraine without mention of status migrainosus   . Other and unspecified hyperlipidemia   . Other malaise and fatigue   . Overweight(278.02)   . Personal history of chemotherapy now   ovarian  . PONV (postoperative nausea and vomiting)   . Spinal stenosis in cervical region   . Symptomatic menopausal or female climacteric states   . Unspecified disorder of skin and subcutaneous tissue   . Unspecified vitamin D deficiency     PAST SURGICAL HISTORY: Past Surgical History:  Procedure Laterality Date  . ABDOMINAL HYSTERECTOMY    . ANTERIOR CERVICAL DECOMP/DISCECTOMY FUSION N/A 06/07/2015   Procedure: Cervical three - four and Cervical six- seven anterior cervical decompression with fusion interbody prosthesis plating and bonegraft;  Surgeon: Newman Pies, MD;  Location: [place] [facility];  Service: Neurosurgery;  Laterality: N/A;  C34 and C67 anterior cervical decompression with fusion interbody prosthesis plating and bonegraft  . BACK SURGERY     x2 Lower   . EVACUATION OF CERVICAL HEMATOMA N/A 06/14/2015   Procedure: EVACUATION OF CERVICAL HEMATOMA;  Surgeon: Newman Pies, MD;  Location: [facility];  Service:  Neurosurgery;  Laterality: N/A;  . NECK SURGERY     x3  . TONSILLECTOMY      FAMILY HISTORY: Family History  Problem Relation Age of Onset  . Depression Mother   . Migraines Mother   . Dementia Father   . Diabetes Father   . Hyperlipidemia Father   . Hyperlipidemia Brother   . Hyperlipidemia Brother   . Breast cancer Paternal Aunt        74s    ADVANCED DIRECTIVES (Y/N):  N  HEALTH MAINTENANCE: Social History   Tobacco Use  . Smoking status: Former Smoker    Packs/day: 1.50    Years: 20.00    Pack years: 30.00    Types: Cigarettes    Start date: 03/19/1979    Last attempt to quit: 08/25/1999    Years since quitting: 18.7  . Smokeless tobacco: Never Used  . Tobacco comment: smoking cessation materials not required  Substance Use Topics  . Alcohol use: Not Currently    Alcohol/week: 0.0 standard drinks  . Drug use: No     Colonoscopy:  PAP:  Bone density:  Lipid panel:  No Known Allergies  Current Outpatient Medications  Medication Sig Dispense Refill  . albuterol (PROVENTIL HFA;VENTOLIN HFA) 108 (90 Base) MCG/ACT inhaler Inhale 2 puffs into the lungs every 6 (six) hours as needed for wheezing or shortness of breath. 1 Inhaler 2  . albuterol (PROVENTIL) (2.5 MG/3ML) 0.083% nebulizer solution Take 3 mLs (2.5 mg total) by nebulization every 6 (six) hours as needed for wheezing or shortness of breath. 75 mL 2  . Ascorbic Acid (VITAMIN C PO) Take 1 tablet by mouth daily.     . budesonide-formoterol (SYMBICORT) 160-4.5 MCG/ACT inhaler Inhale 2 puffs into the lungs 2 (two) times daily.    . Cholecalciferol (VITAMIN D-1000 MAX ST) 1000 UNITS tablet Take 1 tablet by mouth 2 (two) times daily.    . Cyanocobalamin (B-12 PO) Take 1 tablet by mouth daily.     . cyclobenzaprine (FLEXERIL) 10 MG tablet Take one tablet every 8 hours as needed 30 tablet 1  . DULoxetine (CYMBALTA) 60 MG capsule TAKE 1 CAPSULE(60 MG) BY MOUTH DAILY 180 capsule 1  . estradiol (ESTRACE) 0.5 MG tablet  Take 1 tablet (0.5 mg total) by mouth daily. 90 tablet 4  . ferrous sulfate (IRON SUPPLEMENT) 325 (65 FE) MG tablet Take 1 tablet by mouth daily.    . fluticasone (FLONASE) 50 MCG/ACT nasal spray Place 2 sprays into both nostrils as needed. 48 g 1  . gabapentin (NEURONTIN) 300 MG capsule Take 1-3 capsules (300-900 mg total) by mouth 3 (three) times daily. 300 mg twice a day and 900 mg at night 540 capsule 1  . ipratropium (ATROVENT HFA) 17 MCG/ACT inhaler Inhale 2 puffs into the lungs every 6 (six) hours as needed.     . lidocaine (XYLOCAINE) 2 % solution Use as directed 15 mLs in the mouth or throat as needed for mouth pain. 100 mL 0  . Lysine 1000 MG TABS Take 1 tablet by mouth daily.     . magic mouthwash w/lidocaine SOLN Take 5 mLs by mouth 3 (three) times daily. 315 mL 0  .  Magnesium Oxide 500 MG CAPS Take 500 mg by mouth 2 (two) times daily.     . Multiple Vitamins-Minerals (MULTIVITAMIN PO) Take 1 tablet by mouth daily.     . Omega-3 Fatty Acids (FISH OIL PO) Take 1 capsule by mouth 2 (two) times daily.     Marland Kitchen oxyCODONE-acetaminophen (PERCOCET) 10-325 MG tablet Take 1 tablet by mouth every 6 (six) hours as needed for pain. 120 tablet 0  . potassium gluconate 595 MG TABS tablet Take 595 mg by mouth daily.    . SUMAtriptan (IMITREX) 100 MG tablet May repeat in 2 hours if headache persists or recurs. 27 tablet 0  . temazepam (RESTORIL) 15 MG capsule Take 1 capsule (15 mg total) by mouth at bedtime as needed for sleep. 90 capsule 0  . triamcinolone (KENALOG) 0.1 % paste Use as directed 1 application in the mouth or throat 2 (two) times daily. 5 g 2  . urea (CARMOL) 10 % cream Apply topically as needed. 410 g 1  . valACYclovir (VALTREX) 1000 MG tablet TAKE 1 TABLET(1000 MG) BY MOUTH THREE TIMES DAILY FOR 7 DAYS 21 tablet 0   No current facility-administered medications for this visit.    Facility-Administered Medications Ordered in Other Visits  Medication Dose Route Frequency Provider Last  Rate Last Dose  . 0.9 %  sodium chloride infusion   Intravenous Once Lloyd Huger, MD      . 0.9 %  sodium chloride infusion   Intravenous Once Lloyd Huger, MD      . dexamethasone (DECADRON) 20 mg in sodium chloride 0.9 % 50 mL IVPB  20 mg Intravenous Once Lloyd Huger, MD      . dexamethasone (DECADRON) injection 10 mg  10 mg Intravenous Once Lloyd Huger, MD      . sodium chloride flush (NS) 0.9 % injection 10 mL  10 mL Intravenous PRN Lloyd Huger, MD   10 mL at 06/16/17 0854    OBJECTIVE: Vitals:   05/04/18 0916  BP: (!) 157/79  Pulse: 88  Temp: (!) 97.3 F (36.3 C)     Body mass index is 29.69 kg/m.    ECOG FS:0 - Asymptomatic  General: Well-developed, well-nourished, no acute distress. Eyes: Pink conjunctiva, anicteric sclera. HEENT: Normocephalic, moist mucous membranes. Lungs: Clear to auscultation bilaterally. Heart: Regular rate and rhythm. No rubs, murmurs, or gallops. Abdomen: Soft, nontender, nondistended. No organomegaly noted, normoactive bowel sounds. Musculoskeletal: No edema, cyanosis, or clubbing. Neuro: Alert, answering all questions appropriately. Cranial nerves grossly intact. Skin: No rashes or petechiae noted. Psych: Normal affect.  LAB RESULTS:  Lab Results  Component Value Date   NA 138 05/04/2018   K 3.5 05/04/2018   CL 102 05/04/2018   CO2 26 05/04/2018   GLUCOSE 150 (H) 05/04/2018   BUN 25 (H) 05/04/2018   CREATININE 1.00 05/04/2018   CALCIUM 8.7 (L) 05/04/2018   PROT 6.7 05/04/2018   ALBUMIN 3.8 05/04/2018   AST 118 (H) 05/04/2018   ALT 118 (H) 05/04/2018   ALKPHOS 82 05/04/2018   BILITOT 0.7 05/04/2018   GFRNONAA 60 (L) 05/04/2018   GFRAA >60 05/04/2018    Lab Results  Component Value Date   WBC 8.4 05/04/2018   NEUTROABS 4.7 05/04/2018   HGB 9.5 (L) 05/04/2018   HCT 27.9 (L) 05/04/2018   MCV 104.1 (H) 05/04/2018   PLT 66 (L) 05/04/2018     STUDIES: No results found.  ONCOLOGY HISTORY:  Patient underwent debulking surgery  at [facility] on November 19, 2016. She received one infusion preoperative infusion of Keytruda on November 11, 2016 as part of a clinical trial.  She initiated adjuvant carboplatinum and Taxol on December 16, 2016.  This was continued through April 28, 2017 at which time Taxol was discontinued secondary to worsening neuropathy.  Patient then received single agent carboplatinum for additional 2 infusions.  She was then noted to have progression of disease and was switched to Avastin and doxorubicin on August 18, 2017 this was discontinued on November 24, 2017 secondary to declining performance status.  Palliative gemcitabine was initiated on February 20, 2018.   ASSESSMENT: Stage IIIc high-grade serous ovarian carcinoma  PLAN:  1. Stage IIIc high-grade serous ovarian carcinoma: See oncology history as above.  CT scans on February 02, 2018 reviewed independently with progression of disease.  CA-125 initially decreased to 44.2, but now has trended up slightly to 52.9.  Today's result is pending.  Patient has inquired about intraperitoneal chemotherapy, but the benefit is unclear with increased toxicity.  Continue current treatment as planned.  Plan on using gemcitabine on days 1, 8, and 15 with a 22 off.  Because of patient's history of thrombocytopenia and neutropenia, gemcitabine has been dose reduced 10%.  Delay cycle 3, day 15 of treatment today secondary to thrombocytopenia and decreased performance status.  We will get a PET scan in the next week to assess for any interval change of disease.  Patient return to clinic in 1 week for further evaluation and consideration of continuation of treatment.  2. Pain: Well controlled.  Continue oxycodone as needed. 3. Anxiety: Chronic and unchanged.  Continue Xanax as prescribed. 4.  Peripheral neuropathy: Chronic and unchanged.  Continue gabapentin as prescribed.  Patient has agreed to acupuncture.   5.  Anemia: Patient's  hemoglobin is slowly trending down is now 9.5.  Monitor.   6.  Shoulder pain: Patient does not complain of this today.  Continue cortisone injections as needed.  Patient is also indicated she may need rotator cuff surgery in the future.  Okay to proceed from an oncology standpoint, but will have to be coordinated with chemotherapy. 7.  Thrombocytopenia: Delay treatment as above. 8.  Neutropenia: Resolved. 9.  Elevated liver enzymes: AST and ALT are elevated, but improved over last week.  Monitor.  Patient expressed understanding and was in agreement with this plan. She also understands that She can call clinic at any time with any questions, concerns, or complaints.   Cancer Staging Ovarian cancer, unspecified laterality ([place]) Staging form: Ovary, Fallopian Tube, and Primary Peritoneal Carcinoma, AJCC 8th Edition - Clinical: No stage assigned - Unsigned   Lloyd Huger, MD   05/04/2018 1:04 PM

## 2018-05-04 ENCOUNTER — Inpatient Hospital Stay: Payer: PPO

## 2018-05-04 ENCOUNTER — Inpatient Hospital Stay (HOSPITAL_BASED_OUTPATIENT_CLINIC_OR_DEPARTMENT_OTHER): Payer: PPO | Admitting: Oncology

## 2018-05-04 ENCOUNTER — Other Ambulatory Visit: Payer: Self-pay

## 2018-05-04 VITALS — BP 157/79 | HR 88 | Temp 97.3°F | Ht 62.0 in | Wt 162.3 lb

## 2018-05-04 DIAGNOSIS — C569 Malignant neoplasm of unspecified ovary: Secondary | ICD-10-CM

## 2018-05-04 DIAGNOSIS — R918 Other nonspecific abnormal finding of lung field: Secondary | ICD-10-CM

## 2018-05-04 DIAGNOSIS — F419 Anxiety disorder, unspecified: Secondary | ICD-10-CM

## 2018-05-04 DIAGNOSIS — I10 Essential (primary) hypertension: Secondary | ICD-10-CM | POA: Diagnosis not present

## 2018-05-04 DIAGNOSIS — G629 Polyneuropathy, unspecified: Secondary | ICD-10-CM | POA: Diagnosis not present

## 2018-05-04 DIAGNOSIS — N898 Other specified noninflammatory disorders of vagina: Secondary | ICD-10-CM | POA: Diagnosis not present

## 2018-05-04 DIAGNOSIS — Z9221 Personal history of antineoplastic chemotherapy: Secondary | ICD-10-CM | POA: Diagnosis not present

## 2018-05-04 DIAGNOSIS — Z85828 Personal history of other malignant neoplasm of skin: Secondary | ICD-10-CM

## 2018-05-04 DIAGNOSIS — Z87891 Personal history of nicotine dependence: Secondary | ICD-10-CM | POA: Diagnosis not present

## 2018-05-04 DIAGNOSIS — Z90722 Acquired absence of ovaries, bilateral: Secondary | ICD-10-CM | POA: Diagnosis not present

## 2018-05-04 DIAGNOSIS — Z5111 Encounter for antineoplastic chemotherapy: Secondary | ICD-10-CM | POA: Diagnosis not present

## 2018-05-04 DIAGNOSIS — Z79899 Other long term (current) drug therapy: Secondary | ICD-10-CM | POA: Diagnosis not present

## 2018-05-04 DIAGNOSIS — R748 Abnormal levels of other serum enzymes: Secondary | ICD-10-CM | POA: Diagnosis not present

## 2018-05-04 LAB — COMPREHENSIVE METABOLIC PANEL
ALT: 118 U/L — ABNORMAL HIGH (ref 0–44)
AST: 118 U/L — ABNORMAL HIGH (ref 15–41)
Albumin: 3.8 g/dL (ref 3.5–5.0)
Alkaline Phosphatase: 82 U/L (ref 38–126)
Anion gap: 10 (ref 5–15)
BUN: 25 mg/dL — ABNORMAL HIGH (ref 8–23)
CHLORIDE: 102 mmol/L (ref 98–111)
CO2: 26 mmol/L (ref 22–32)
Calcium: 8.7 mg/dL — ABNORMAL LOW (ref 8.9–10.3)
Creatinine, Ser: 1 mg/dL (ref 0.44–1.00)
GFR calc Af Amer: >60 mL/min (ref >60)
GFR calc non Af Amer: 60 mL/min — ABNORMAL LOW (ref >60)
Glucose, Bld: 150 mg/dL — ABNORMAL HIGH (ref 70–99)
Potassium: 3.5 mmol/L (ref 3.5–5.1)
Sodium: 138 mmol/L (ref 135–145)
Total Bilirubin: 0.7 mg/dL (ref 0.3–1.2)
Total Protein: 6.7 g/dL (ref 6.5–8.1)

## 2018-05-04 LAB — CBC WITH DIFFERENTIAL/PLATELET
Abs Immature Granulocytes: 0.33 10*3/uL — ABNORMAL HIGH (ref 0.00–0.07)
Basophils Absolute: 0 10*3/uL (ref 0.0–0.1)
Basophils Relative: 0 %
Eosinophils Absolute: 0 10*3/uL (ref 0.0–0.5)
Eosinophils Relative: 0 %
HCT: 27.9 % — ABNORMAL LOW (ref 36.0–46.0)
Hemoglobin: 9.5 g/dL — ABNORMAL LOW (ref 12.0–15.0)
IMMATURE GRANULOCYTES: 4 %
Lymphocytes Relative: 33 %
Lymphs Abs: 2.7 10*3/uL (ref 0.7–4.0)
MCH: 35.4 pg — ABNORMAL HIGH (ref 26.0–34.0)
MCHC: 34.1 g/dL (ref 30.0–36.0)
MCV: 104.1 fL — ABNORMAL HIGH (ref 80.0–100.0)
Monocytes Absolute: 0.6 10*3/uL (ref 0.1–1.0)
Monocytes Relative: 7 %
NEUTROS PCT: 56 %
Neutro Abs: 4.7 10*3/uL (ref 1.7–7.7)
Platelets: 66 10*3/uL — ABNORMAL LOW (ref 150–400)
RBC: 2.68 MIL/uL — ABNORMAL LOW (ref 3.87–5.11)
RDW: 16.3 % — ABNORMAL HIGH (ref 11.5–15.5)
WBC: 8.4 10*3/uL (ref 4.0–10.5)
nRBC: 1.1 % — ABNORMAL HIGH (ref 0.0–0.2)

## 2018-05-04 MED ORDER — SODIUM CHLORIDE 0.9 % IV SOLN
Freq: Once | INTRAVENOUS | Status: AC
  Administered 2018-05-04: 10:00:00 via INTRAVENOUS
  Filled 2018-05-04: qty 250

## 2018-05-04 MED ORDER — HEPARIN SOD (PORK) LOCK FLUSH 100 UNIT/ML IV SOLN
500.0000 [IU] | Freq: Once | INTRAVENOUS | Status: AC
  Administered 2018-05-04: 500 [IU] via INTRAVENOUS
  Filled 2018-05-04: qty 5

## 2018-05-04 MED ORDER — SODIUM CHLORIDE 0.9% FLUSH
10.0000 mL | Freq: Once | INTRAVENOUS | Status: AC
  Administered 2018-05-04: 10 mL via INTRAVENOUS
  Filled 2018-05-04: qty 10

## 2018-05-04 NOTE — Progress Notes (Signed)
Patient is here today to follow up on her primary high grade serous adenocarcinoma of ovary. Patient stated that she was doing well until up to yesterday. Patient stated that her taste buds were off and that she felt weak and fatigued all day yesterday. Patient denied poor appetite, fever, chill, nausea, constipation or diarrhea.

## 2018-05-07 ENCOUNTER — Encounter
Admission: RE | Admit: 2018-05-07 | Discharge: 2018-05-07 | Disposition: A | Discharge status: Home / Self Care | Payer: PPO | Source: Ambulatory Visit | Attending: Oncology | Admitting: Oncology

## 2018-05-07 DIAGNOSIS — C569 Malignant neoplasm of unspecified ovary: Secondary | ICD-10-CM | POA: Diagnosis not present

## 2018-05-07 DIAGNOSIS — Z79899 Other long term (current) drug therapy: Secondary | ICD-10-CM | POA: Diagnosis not present

## 2018-05-07 LAB — GLUCOSE, CAPILLARY: Glucose-Capillary: 77 mg/dL (ref 70–99)

## 2018-05-07 MED ORDER — FLUDEOXYGLUCOSE F - 18 (FDG) INJECTION
8.4000 | Freq: Once | INTRAVENOUS | Status: AC | PRN
  Administered 2018-05-07: 9.17 via INTRAVENOUS

## 2018-05-10 NOTE — Progress Notes (Signed)
[place]  [phone] [phone]  ID: [name] OB: 01-05-1956  MR#: [id number]  JOI#:[id number]  Patient Care Team: Steele Sizer, MD as PCP - General (Family Medicine) Lloyd Huger, MD as Consulting Physician (Oncology) Mellody Drown, MD as Consulting Physician (Obstetrics and Gynecology) Cathi Roan, [facility] (Pharmacist) Benedetto Goad, RN as Case Manager Clent Jacks, RN as Registered Nurse  CHIEF COMPLAINT: Stage IIIc high-grade serous ovarian carcinoma  INTERVAL HISTORY: Patient returns to clinic today for further evaluation and discussion of her PET scan results.  She is having mouth pain secondary to tooth ache and has an appointment with the dentist tomorrow, but otherwise feels well.  She continues to have chronic weakness and fatigue and admits to more anxiety.  She continues to have a mild peripheral neuropathy that is chronic and unchanged. She has no other neurologic complaints.  She denies any recent fevers or illnesses.  She has a good appetite and her weight has remained stable. She has no chest pain or shortness of breath. She denies any nausea, vomiting, constipation, or diarrhea.  She denies any abdominal pain or bloating.  She has no urinary complaints.  Patient offers no further specific complaints today.  REVIEW OF SYSTEMS:   Review of Systems  Constitutional: Positive for malaise/fatigue. Negative for fever and weight loss.  HENT: Negative.  Negative for sore throat.   Respiratory: Negative.  Negative for cough and shortness of breath.   Cardiovascular: Negative.  Negative for chest pain and leg swelling.  Gastrointestinal: Negative.  Negative for abdominal pain, blood in stool, constipation, diarrhea, melena, nausea and vomiting.  Genitourinary: Negative.  Negative for dysuria.  Musculoskeletal: Positive for joint pain. Negative for back pain and neck pain.  Skin: Negative.  Negative for itching  and rash.  Neurological: Positive for tingling, sensory change and weakness. Negative for focal weakness.  Psychiatric/Behavioral: The patient is nervous/anxious.     As per HPI. Otherwise, a complete review of systems is negative.  PAST MEDICAL HISTORY: Past Medical History:  Diagnosis Date  . Allergic rhinitis, cause unspecified   . Anxiety state, unspecified   . Arthritis   . Asthma    only when sick   . Backache, unspecified   . Bronchitis    hx of when get sick  . Cancer ([place])    skin cancer , basal cell   . Cancer ([place]) 11/2016   ovarian  . Cervicalgia   . Complication of anesthesia   . Dermatophytosis of nail   . Dysmetabolic syndrome X   . Encounter for long-term (current) use of other medications   . Esophageal reflux   . Insomnia, unspecified   . Leukocytosis, unspecified   . Migraine without aura, without mention of intractable migraine without mention of status migrainosus   . Other and unspecified hyperlipidemia   . Other malaise and fatigue   . Overweight(278.02)   . Personal history of chemotherapy now   ovarian  . PONV (postoperative nausea and vomiting)   . Spinal stenosis in cervical region   . Symptomatic menopausal or female climacteric states   . Unspecified disorder of skin and subcutaneous tissue   . Unspecified vitamin D deficiency     PAST SURGICAL HISTORY: Past Surgical History:  Procedure Laterality Date  . ABDOMINAL HYSTERECTOMY    . ANTERIOR CERVICAL DECOMP/DISCECTOMY FUSION N/A 06/07/2015   Procedure: Cervical three - four and Cervical six- seven anterior cervical decompression with fusion interbody prosthesis plating and  bonegraft;  Surgeon: Newman Pies, MD;  Location: [facility];  Service: Neurosurgery;  Laterality: N/A;  C34 and C67 anterior cervical decompression with fusion interbody prosthesis plating and bonegraft  . BACK SURGERY     x2 Lower   . EVACUATION OF CERVICAL HEMATOMA N/A 06/14/2015   Procedure: EVACUATION OF  CERVICAL HEMATOMA;  Surgeon: Newman Pies, MD;  Location: [facility];  Service: Neurosurgery;  Laterality: N/A;  . NECK SURGERY     x3  . TONSILLECTOMY      FAMILY HISTORY: Family History  Problem Relation Age of Onset  . Depression Mother   . Migraines Mother   . Dementia Father   . Diabetes Father   . Hyperlipidemia Father   . Hyperlipidemia Brother   . Hyperlipidemia Brother   . Breast cancer Paternal Aunt        63s    ADVANCED DIRECTIVES (Y/N):  N  HEALTH MAINTENANCE: Social History   Tobacco Use  . Smoking status: Former Smoker    Packs/day: 1.50    Years: 20.00    Pack years: 30.00    Types: Cigarettes    Start date: 03/19/1979    Last attempt to quit: 08/25/1999    Years since quitting: 18.7  . Smokeless tobacco: Never Used  . Tobacco comment: smoking cessation materials not required  Substance Use Topics  . Alcohol use: Not Currently    Alcohol/week: 0.0 standard drinks  . Drug use: No     Colonoscopy:  PAP:  Bone density:  Lipid panel:  No Known Allergies  Current Outpatient Medications  Medication Sig Dispense Refill  . albuterol (PROVENTIL HFA;VENTOLIN HFA) 108 (90 Base) MCG/ACT inhaler Inhale 2 puffs into the lungs every 6 (six) hours as needed for wheezing or shortness of breath. 1 Inhaler 2  . albuterol (PROVENTIL) (2.5 MG/3ML) 0.083% nebulizer solution Take 3 mLs (2.5 mg total) by nebulization every 6 (six) hours as needed for wheezing or shortness of breath. 75 mL 2  . Ascorbic Acid (VITAMIN C PO) Take 1 tablet by mouth daily.     . budesonide-formoterol (SYMBICORT) 160-4.5 MCG/ACT inhaler Inhale 2 puffs into the lungs 2 (two) times daily.    . Cholecalciferol (VITAMIN D-1000 MAX ST) 1000 UNITS tablet Take 1 tablet by mouth 2 (two) times daily.    . Cyanocobalamin (B-12 PO) Take 1 tablet by mouth daily.     . cyclobenzaprine (FLEXERIL) 10 MG tablet Take one tablet every 8 hours as needed 30 tablet 1  . DULoxetine (CYMBALTA) 60 MG capsule  TAKE 1 CAPSULE(60 MG) BY MOUTH DAILY 180 capsule 1  . estradiol (ESTRACE) 0.5 MG tablet Take 1 tablet (0.5 mg total) by mouth daily. 90 tablet 4  . ferrous sulfate (IRON SUPPLEMENT) 325 (65 FE) MG tablet Take 1 tablet by mouth daily.    . fluticasone (FLONASE) 50 MCG/ACT nasal spray Place 2 sprays into both nostrils as needed. 48 g 1  . gabapentin (NEURONTIN) 300 MG capsule Take 1-3 capsules (300-900 mg total) by mouth 3 (three) times daily. 300 mg twice a day and 900 mg at night 540 capsule 1  . ipratropium (ATROVENT HFA) 17 MCG/ACT inhaler Inhale 2 puffs into the lungs every 6 (six) hours as needed.     . lidocaine (XYLOCAINE) 2 % solution Use as directed 15 mLs in the mouth or throat as needed for mouth pain. 100 mL 0  . Lysine 1000 MG TABS Take 1 tablet by mouth daily.     Marland Kitchen  magic mouthwash w/lidocaine SOLN Take 5 mLs by mouth 3 (three) times daily. 315 mL 0  . Magnesium Oxide 500 MG CAPS Take 500 mg by mouth 2 (two) times daily.     . Multiple Vitamins-Minerals (MULTIVITAMIN PO) Take 1 tablet by mouth daily.     . Omega-3 Fatty Acids (FISH OIL PO) Take 1 capsule by mouth 2 (two) times daily.     Marland Kitchen oxyCODONE-acetaminophen (PERCOCET) 10-325 MG tablet Take 1 tablet by mouth every 6 (six) hours as needed for pain. 120 tablet 0  . potassium gluconate 595 MG TABS tablet Take 595 mg by mouth daily.    . SUMAtriptan (IMITREX) 100 MG tablet May repeat in 2 hours if headache persists or recurs. 27 tablet 0  . temazepam (RESTORIL) 15 MG capsule Take 1 capsule (15 mg total) by mouth at bedtime as needed for sleep. 90 capsule 0  . triamcinolone (KENALOG) 0.1 % paste Use as directed 1 application in the mouth or throat 2 (two) times daily. 5 g 2  . urea (CARMOL) 10 % cream Apply topically as needed. 410 g 1  . valACYclovir (VALTREX) 1000 MG tablet TAKE 1 TABLET(1000 MG) BY MOUTH THREE TIMES DAILY FOR 7 DAYS 21 tablet 0   No current facility-administered medications for this visit.     Facility-Administered Medications Ordered in Other Visits  Medication Dose Route Frequency Provider Last Rate Last Dose  . 0.9 %  sodium chloride infusion   Intravenous Once Lloyd Huger, MD      . 0.9 %  sodium chloride infusion   Intravenous Once Lloyd Huger, MD      . dexamethasone (DECADRON) 20 mg in sodium chloride 0.9 % 50 mL IVPB  20 mg Intravenous Once Lloyd Huger, MD      . dexamethasone (DECADRON) injection 10 mg  10 mg Intravenous Once Lloyd Huger, MD      . sodium chloride flush (NS) 0.9 % injection 10 mL  10 mL Intravenous PRN Lloyd Huger, MD   10 mL at 06/16/17 0854    OBJECTIVE: Vitals:   05/11/18 1106  BP: (!) 165/88  Pulse: 83  Temp: (!) 96.9 F (36.1 C)     Body mass index is 29.26 kg/m.    ECOG FS:0 - Asymptomatic  General: Well-developed, well-nourished, no acute distress. Eyes: Pink conjunctiva, anicteric sclera. HEENT: Normocephalic, moist mucous membranes. Lungs: Clear to auscultation bilaterally. Heart: Regular rate and rhythm. No rubs, murmurs, or gallops. Abdomen: Soft, nontender, nondistended. No organomegaly noted, normoactive bowel sounds. Musculoskeletal: No edema, cyanosis, or clubbing. Neuro: Alert, answering all questions appropriately. Cranial nerves grossly intact. Skin: No rashes or petechiae noted. Psych: Normal affect.  LAB RESULTS:  Lab Results  Component Value Date   NA 138 05/04/2018   K 3.5 05/04/2018   CL 102 05/04/2018   CO2 26 05/04/2018   GLUCOSE 150 (H) 05/04/2018   BUN 25 (H) 05/04/2018   CREATININE 1.00 05/04/2018   CALCIUM 8.7 (L) 05/04/2018   PROT 6.7 05/04/2018   ALBUMIN 3.8 05/04/2018   AST 118 (H) 05/04/2018   ALT 118 (H) 05/04/2018   ALKPHOS 82 05/04/2018   BILITOT 0.7 05/04/2018   GFRNONAA 60 (L) 05/04/2018   GFRAA >60 05/04/2018    Lab Results  Component Value Date   WBC 5.7 05/11/2018   NEUTROABS 3.6 05/11/2018   HGB 11.6 (L) 05/11/2018   HCT 33.5 (L)  05/11/2018   MCV 103.1 (H) 05/11/2018   PLT  239 05/11/2018     STUDIES: Nm Pet Image Initial (pi) Skull Base To Thigh  Result Date: 05/07/2018 CLINICAL DATA:  Subsequent treatment strategy for ovarian cancer. EXAM: NUCLEAR MEDICINE PET SKULL BASE TO THIGH TECHNIQUE: 9.2 mCi F-18 FDG was injected intravenously. Full-ring PET imaging was performed from the skull base to thigh after the radiotracer. CT data was obtained and used for attenuation correction and anatomic localization. Fasting blood glucose: 77 mg/dl COMPARISON:  Multiple exams, including CT scan 02/02/2018 FINDINGS: Mediastinal blood pool activity: SUV max 2.7 NECK: Posterior glottic activity, maximum SUV 6.4, likely physiologic. Incidental CT findings: none CHEST: No significant abnormal hypermetabolic activity in this region. Incidental CT findings: 3 mm right middle lobe subpleural nodule on image 107/3, stable in size without appreciable hypermetabolic activity. Stable small calcified granuloma in the left upper lobe. Right Port-A-Cath tip: SVC. Mild cardiomegaly. Atherosclerotic calcification of the thoracic aorta. ABDOMEN/PELVIS: Significant improvement in nodularity along the left upper quadrant omentum with only very faint residual nodularity for example on image 160/3, notably improved. Maximum SUV in this region of subtle residual omental nodularity 1.5. Incidental CT findings: Cholecystectomy. Aortoiliac atherosclerotic vascular disease. Small ventral supraumbilical hernias contain adipose tissue. SKELETON: Incidental mild accentuation of activity along the right pre-acromial os acromiale and AC joint. No compelling findings of osseous metastatic disease. Incidental CT findings: Graft harvest site from the left iliac bone. Postoperative findings in the lower lumbar spine. Postoperative findings in the cervical spine. IMPRESSION: 1. Significant improvement and near resolution of the left upper quadrant omental nodularity. Current  maximum SUV in this vicinity is 1.5, well below the background blood pool activity level. 2. No new metastatic lesions are observed. 3. Other imaging findings of potential clinical significance: Aortic Atherosclerosis (ICD10-I70.0). Mild cardiomegaly. Small ventral supraumbilical hernias contain adipose tissue. Electronically Signed   By: Van Clines M.D.   On: 05/07/2018 12:51    ONCOLOGY HISTORY: Patient underwent debulking surgery at [facility] on November 19, 2016. She received one infusion preoperative infusion of Keytruda on November 11, 2016 as part of a clinical trial.  She initiated adjuvant carboplatinum and Taxol on December 16, 2016.  This was continued through April 28, 2017 at which time Taxol was discontinued secondary to worsening neuropathy.  Patient then received single agent carboplatinum for additional 2 infusions.  She was then noted to have progression of disease and was switched to Avastin and doxorubicin on August 18, 2017 this was discontinued on November 24, 2017 secondary to declining performance status.  Palliative gemcitabine was initiated on February 20, 2018.   ASSESSMENT: Stage IIIc high-grade serous ovarian carcinoma  PLAN:  1. Stage IIIc high-grade serous ovarian carcinoma: See oncology history as above.  PET scan results from May 07, 2018 reviewed independently and report as above with significant improvement in disease burden.  Despite this, CA-125 initially decreased to 44.2, but now has trended up slightly to 52.9.  Patient has inquired about intraperitoneal chemotherapy, but the benefit is unclear with increased toxicity.  Continue current treatment as planned.  Patient missed day 15 of cycle 3 secondary thrombocytopenia and persistent tooth pain.  Return to clinic in 1 week for further evaluation and consideration of cycle 4, day 1.   2. Pain: Well controlled.  Continue oxycodone as needed. 3. Anxiety: Chronic and unchanged.  Continue Xanax as  prescribed. 4.  Peripheral neuropathy: Chronic and unchanged.  Continue gabapentin as prescribed.  Patient has agreed to acupuncture.   5.  Anemia: Hemoglobin significantly improved and  is now 11.6. 6.  Shoulder pain: Patient does not complain of this today.  Continue cortisone injections as needed.  Patient is also indicated she may need rotator cuff surgery in the future.  Okay to proceed from an oncology standpoint, but will have to be coordinated with chemotherapy. 7.  Thrombocytopenia: Resolved. 8.  Neutropenia: Resolved. 9.  Elevated liver enzymes: AST and ALT are elevated, but improved over last week.  Monitor. 10.  Tooth pain: Patient was instructed to keep her appointment tomorrow as scheduled.  Patient expressed understanding and was in agreement with this plan. She also understands that She can call clinic at any time with any questions, concerns, or complaints.   Cancer Staging Ovarian cancer, unspecified laterality ([place]) Staging form: Ovary, Fallopian Tube, and Primary Peritoneal Carcinoma, AJCC 8th Edition - Clinical: No stage assigned - Unsigned   Lloyd Huger, MD   05/12/2018 1:10 PM

## 2018-05-11 ENCOUNTER — Inpatient Hospital Stay: Payer: PPO

## 2018-05-11 ENCOUNTER — Inpatient Hospital Stay (HOSPITAL_BASED_OUTPATIENT_CLINIC_OR_DEPARTMENT_OTHER): Payer: PPO | Admitting: Oncology

## 2018-05-11 ENCOUNTER — Other Ambulatory Visit: Payer: Self-pay

## 2018-05-11 VITALS — BP 165/88 | HR 83 | Temp 96.9°F | Ht 62.0 in | Wt 160.0 lb

## 2018-05-11 DIAGNOSIS — C569 Malignant neoplasm of unspecified ovary: Secondary | ICD-10-CM | POA: Diagnosis not present

## 2018-05-11 DIAGNOSIS — F419 Anxiety disorder, unspecified: Secondary | ICD-10-CM

## 2018-05-11 DIAGNOSIS — G629 Polyneuropathy, unspecified: Secondary | ICD-10-CM | POA: Diagnosis not present

## 2018-05-11 DIAGNOSIS — Z90722 Acquired absence of ovaries, bilateral: Secondary | ICD-10-CM

## 2018-05-11 DIAGNOSIS — Z87891 Personal history of nicotine dependence: Secondary | ICD-10-CM

## 2018-05-11 DIAGNOSIS — Z5111 Encounter for antineoplastic chemotherapy: Secondary | ICD-10-CM | POA: Diagnosis not present

## 2018-05-11 LAB — CBC WITH DIFFERENTIAL/PLATELET
Abs Immature Granulocytes: 0.22 10*3/uL — ABNORMAL HIGH (ref 0.00–0.07)
Basophils Absolute: 0 10*3/uL (ref 0.0–0.1)
Basophils Relative: 1 %
Eosinophils Absolute: 0.1 10*3/uL (ref 0.0–0.5)
Eosinophils Relative: 1 %
HCT: 33.5 % — ABNORMAL LOW (ref 36.0–46.0)
Hemoglobin: 11.6 g/dL — ABNORMAL LOW (ref 12.0–15.0)
Immature Granulocytes: 4 %
Lymphocytes Relative: 18 %
Lymphs Abs: 1 10*3/uL (ref 0.7–4.0)
MCH: 35.7 pg — AB (ref 26.0–34.0)
MCHC: 34.6 g/dL (ref 30.0–36.0)
MCV: 103.1 fL — ABNORMAL HIGH (ref 80.0–100.0)
MONO ABS: 0.6 10*3/uL (ref 0.1–1.0)
Monocytes Relative: 11 %
Neutro Abs: 3.6 10*3/uL (ref 1.7–7.7)
Neutrophils Relative %: 65 %
Platelets: 239 10*3/uL (ref 150–400)
RBC: 3.25 MIL/uL — ABNORMAL LOW (ref 3.87–5.11)
RDW: 17.8 % — AB (ref 11.5–15.5)
WBC: 5.7 10*3/uL (ref 4.0–10.5)
nRBC: 0 % (ref 0.0–0.2)

## 2018-05-11 NOTE — Progress Notes (Signed)
Patient is here today today to follow up on her primary high grade serous adenocarcinoma of ovary. Patient stated that she has tooth ache but sees her dentist tomorrow.

## 2018-05-12 ENCOUNTER — Ambulatory Visit: Payer: PPO | Admitting: Oncology

## 2018-05-13 ENCOUNTER — Inpatient Hospital Stay: Payer: PPO | Admitting: Oncology

## 2018-05-13 DIAGNOSIS — M9904 Segmental and somatic dysfunction of sacral region: Secondary | ICD-10-CM | POA: Diagnosis not present

## 2018-05-13 DIAGNOSIS — M4608 Spinal enthesopathy, sacral and sacrococcygeal region: Secondary | ICD-10-CM | POA: Diagnosis not present

## 2018-05-13 DIAGNOSIS — M4607 Spinal enthesopathy, lumbosacral region: Secondary | ICD-10-CM | POA: Diagnosis not present

## 2018-05-13 DIAGNOSIS — M9903 Segmental and somatic dysfunction of lumbar region: Secondary | ICD-10-CM | POA: Diagnosis not present

## 2018-05-18 ENCOUNTER — Other Ambulatory Visit: Payer: Self-pay | Admitting: *Deleted

## 2018-05-18 ENCOUNTER — Inpatient Hospital Stay (HOSPITAL_BASED_OUTPATIENT_CLINIC_OR_DEPARTMENT_OTHER): Payer: PPO | Admitting: Oncology

## 2018-05-18 ENCOUNTER — Encounter: Payer: Self-pay | Admitting: Oncology

## 2018-05-18 ENCOUNTER — Other Ambulatory Visit: Payer: Self-pay

## 2018-05-18 ENCOUNTER — Inpatient Hospital Stay: Payer: PPO

## 2018-05-18 ENCOUNTER — Inpatient Hospital Stay: Payer: PPO | Attending: Oncology

## 2018-05-18 VITALS — BP 191/93 | HR 93 | Temp 97.6°F | Ht 62.0 in | Wt 168.7 lb

## 2018-05-18 DIAGNOSIS — M25519 Pain in unspecified shoulder: Secondary | ICD-10-CM | POA: Insufficient documentation

## 2018-05-18 DIAGNOSIS — C569 Malignant neoplasm of unspecified ovary: Secondary | ICD-10-CM

## 2018-05-18 DIAGNOSIS — R7989 Other specified abnormal findings of blood chemistry: Secondary | ICD-10-CM

## 2018-05-18 DIAGNOSIS — G629 Polyneuropathy, unspecified: Secondary | ICD-10-CM | POA: Insufficient documentation

## 2018-05-18 DIAGNOSIS — Z87891 Personal history of nicotine dependence: Secondary | ICD-10-CM | POA: Insufficient documentation

## 2018-05-18 DIAGNOSIS — Z5111 Encounter for antineoplastic chemotherapy: Secondary | ICD-10-CM | POA: Diagnosis not present

## 2018-05-18 DIAGNOSIS — M5441 Lumbago with sciatica, right side: Principal | ICD-10-CM

## 2018-05-18 DIAGNOSIS — G8929 Other chronic pain: Secondary | ICD-10-CM

## 2018-05-18 DIAGNOSIS — Z79899 Other long term (current) drug therapy: Secondary | ICD-10-CM | POA: Diagnosis not present

## 2018-05-18 DIAGNOSIS — D649 Anemia, unspecified: Secondary | ICD-10-CM | POA: Diagnosis not present

## 2018-05-18 DIAGNOSIS — M542 Cervicalgia: Secondary | ICD-10-CM

## 2018-05-18 DIAGNOSIS — D696 Thrombocytopenia, unspecified: Secondary | ICD-10-CM | POA: Insufficient documentation

## 2018-05-18 LAB — CBC WITH DIFFERENTIAL/PLATELET
Abs Immature Granulocytes: 0.02 10*3/uL (ref 0.00–0.07)
Basophils Absolute: 0 10*3/uL (ref 0.0–0.1)
Basophils Relative: 1 %
Eosinophils Absolute: 0.1 10*3/uL (ref 0.0–0.5)
Eosinophils Relative: 4 %
HCT: 30.3 % — ABNORMAL LOW (ref 36.0–46.0)
Hemoglobin: 10.1 g/dL — ABNORMAL LOW (ref 12.0–15.0)
Immature Granulocytes: 1 %
Lymphocytes Relative: 31 %
Lymphs Abs: 1.2 10*3/uL (ref 0.7–4.0)
MCH: 35.1 pg — ABNORMAL HIGH (ref 26.0–34.0)
MCHC: 33.3 g/dL (ref 30.0–36.0)
MCV: 105.2 fL — ABNORMAL HIGH (ref 80.0–100.0)
MONO ABS: 0.7 10*3/uL (ref 0.1–1.0)
Monocytes Relative: 17 %
NEUTROS ABS: 1.9 10*3/uL (ref 1.7–7.7)
Neutrophils Relative %: 46 %
Platelets: 288 10*3/uL (ref 150–400)
RBC: 2.88 MIL/uL — ABNORMAL LOW (ref 3.87–5.11)
RDW: 16.4 % — ABNORMAL HIGH (ref 11.5–15.5)
WBC: 4 10*3/uL (ref 4.0–10.5)
nRBC: 0 % (ref 0.0–0.2)

## 2018-05-18 LAB — COMPREHENSIVE METABOLIC PANEL
ALT: 21 U/L (ref 0–44)
AST: 29 U/L (ref 15–41)
Albumin: 3.2 g/dL — ABNORMAL LOW (ref 3.5–5.0)
Alkaline Phosphatase: 70 U/L (ref 38–126)
Anion gap: 7 (ref 5–15)
BUN: 16 mg/dL (ref 8–23)
CO2: 26 mmol/L (ref 22–32)
Calcium: 8.3 mg/dL — ABNORMAL LOW (ref 8.9–10.3)
Chloride: 103 mmol/L (ref 98–111)
Creatinine, Ser: 1.19 mg/dL — ABNORMAL HIGH (ref 0.44–1.00)
GFR calc Af Amer: 56 mL/min — ABNORMAL LOW (ref >60)
GFR calc non Af Amer: 49 mL/min — ABNORMAL LOW (ref >60)
Glucose, Bld: 164 mg/dL — ABNORMAL HIGH (ref 70–99)
Potassium: 3.6 mmol/L (ref 3.5–5.1)
Sodium: 136 mmol/L (ref 135–145)
Total Bilirubin: 0.5 mg/dL (ref 0.3–1.2)
Total Protein: 6.2 g/dL — ABNORMAL LOW (ref 6.5–8.1)

## 2018-05-18 MED ORDER — SODIUM CHLORIDE 0.9 % IV SOLN: 10.0000 mg | Freq: Once | INTRAVENOUS | Status: DC

## 2018-05-18 MED ORDER — OXYCODONE-ACETAMINOPHEN 10-325 MG PO TABS: 1.0000 | ORAL_TABLET | Freq: Four times a day (QID) | ORAL | 0 refills | Status: DC | PRN

## 2018-05-18 MED ORDER — CYCLOBENZAPRINE HCL 10 MG PO TABS: ORAL_TABLET | ORAL | 1 refills | Status: DC

## 2018-05-18 MED ORDER — PREDNISONE 10 MG (21) PO TBPK: ORAL_TABLET | ORAL | 0 refills | Status: DC

## 2018-05-18 MED ORDER — SODIUM CHLORIDE 0.9 % IV SOLN
Freq: Once | INTRAVENOUS | Status: AC
  Administered 2018-05-18: 10:00:00 via INTRAVENOUS
  Filled 2018-05-18: qty 250

## 2018-05-18 MED ORDER — SODIUM CHLORIDE 0.9% FLUSH
10.0000 mL | Freq: Once | INTRAVENOUS | Status: AC
  Administered 2018-05-18: 10 mL via INTRAVENOUS
  Filled 2018-05-18: qty 10

## 2018-05-18 MED ORDER — HEPARIN SOD (PORK) LOCK FLUSH 100 UNIT/ML IV SOLN
500.0000 [IU] | Freq: Once | INTRAVENOUS | Status: AC
  Administered 2018-05-18: 500 [IU] via INTRAVENOUS

## 2018-05-18 MED ORDER — HEPARIN SOD (PORK) LOCK FLUSH 100 UNIT/ML IV SOLN
INTRAVENOUS | Status: AC
  Filled 2018-05-18: qty 5

## 2018-05-18 MED ORDER — DEXAMETHASONE SODIUM PHOSPHATE 10 MG/ML IJ SOLN
10.0000 mg | Freq: Once | INTRAMUSCULAR | Status: AC
  Administered 2018-05-18: 10 mg via INTRAVENOUS
  Filled 2018-05-18: qty 1

## 2018-05-18 NOTE — Progress Notes (Signed)
[place]  [phone] [phone]  ID: [name] OB: 03/07/1956  MR#: [id number]  OMV#:[id number]  Patient Care Team: Steele Sizer, MD as PCP - General (Family Medicine) Lloyd Huger, MD as Consulting Physician (Oncology) Mellody Drown, MD as Consulting Physician (Obstetrics and Gynecology) Cathi Roan, [facility] (Pharmacist) Benedetto Goad, RN as Case Manager Clent Jacks, RN as Registered Nurse  CHIEF COMPLAINT: Stage IIIc high-grade serous ovarian carcinoma  INTERVAL HISTORY: Patient returns to clinic today for further evaluation and reconsideration of cycle 4, day 1 of gemcitabine only.  She continues to have a decreased performance status with chronic weakness and fatigue.  She has noticed an increased anasarca as well as mild erythema on her face that started suddenly this past weekend.  She continues to have a mild peripheral neuropathy that is chronic and unchanged. She has no other neurologic complaints.  She denies any recent fevers or illnesses.  She has a poor appetite.  She has no chest pain or shortness of breath. She denies any nausea, vomiting, constipation, or diarrhea.  She denies any abdominal pain or bloating.  She has no urinary complaints.  Patient feels generally terrible, but offers no further specific complaints today.  REVIEW OF SYSTEMS:   Review of Systems  Constitutional: Positive for malaise/fatigue. Negative for fever and weight loss.  HENT: Negative.  Negative for sore throat.   Respiratory: Negative.  Negative for cough and shortness of breath.   Cardiovascular: Positive for leg swelling. Negative for chest pain.  Gastrointestinal: Negative.  Negative for abdominal pain, blood in stool, constipation, diarrhea, melena, nausea and vomiting.  Genitourinary: Negative.  Negative for dysuria.  Musculoskeletal: Positive for joint pain. Negative for back pain and neck pain.  Skin: Negative.  Negative  for itching and rash.  Neurological: Positive for tingling, sensory change and weakness. Negative for focal weakness.  Psychiatric/Behavioral: The patient is nervous/anxious.     As per HPI. Otherwise, a complete review of systems is negative.  PAST MEDICAL HISTORY: Past Medical History:  Diagnosis Date  . Allergic rhinitis, cause unspecified   . Anxiety state, unspecified   . Arthritis   . Asthma    only when sick   . Backache, unspecified   . Bronchitis    hx of when get sick  . Cancer ([place])    skin cancer , basal cell   . Cancer ([place]) 11/2016   ovarian  . Cervicalgia   . Complication of anesthesia   . Dermatophytosis of nail   . Dysmetabolic syndrome X   . Encounter for long-term (current) use of other medications   . Esophageal reflux   . Insomnia, unspecified   . Leukocytosis, unspecified   . Migraine without aura, without mention of intractable migraine without mention of status migrainosus   . Other and unspecified hyperlipidemia   . Other malaise and fatigue   . Overweight(278.02)   . Personal history of chemotherapy now   ovarian  . PONV (postoperative nausea and vomiting)   . Spinal stenosis in cervical region   . Symptomatic menopausal or female climacteric states   . Unspecified disorder of skin and subcutaneous tissue   . Unspecified vitamin D deficiency     PAST SURGICAL HISTORY: Past Surgical History:  Procedure Laterality Date  . ABDOMINAL HYSTERECTOMY    . ANTERIOR CERVICAL DECOMP/DISCECTOMY FUSION N/A 06/07/2015   Procedure: Cervical three - four and Cervical six- seven anterior cervical decompression with fusion interbody prosthesis plating and  bonegraft;  Surgeon: Newman Pies, MD;  Location: [place] [facility];  Service: Neurosurgery;  Laterality: N/A;  C34 and C67 anterior cervical decompression with fusion interbody prosthesis plating and bonegraft  . BACK SURGERY     x2 Lower   . EVACUATION OF CERVICAL HEMATOMA N/A 06/14/2015   Procedure:  EVACUATION OF CERVICAL HEMATOMA;  Surgeon: Newman Pies, MD;  Location: [place] [facility];  Service: Neurosurgery;  Laterality: N/A;  . NECK SURGERY     x3  . TONSILLECTOMY      FAMILY HISTORY: Family History  Problem Relation Age of Onset  . Depression Mother   . Migraines Mother   . Dementia Father   . Diabetes Father   . Hyperlipidemia Father   . Hyperlipidemia Brother   . Hyperlipidemia Brother   . Breast cancer Paternal Aunt        33s    ADVANCED DIRECTIVES (Y/N):  N  HEALTH MAINTENANCE: Social History   Tobacco Use  . Smoking status: Former Smoker    Packs/day: 1.50    Years: 20.00    Pack years: 30.00    Types: Cigarettes    Start date: 03/19/1979    Last attempt to quit: 08/25/1999    Years since quitting: 18.7  . Smokeless tobacco: Never Used  . Tobacco comment: smoking cessation materials not required  Substance Use Topics  . Alcohol use: Not Currently    Alcohol/week: 0.0 standard drinks  . Drug use: No     Colonoscopy:  PAP:  Bone density:  Lipid panel:  No Known Allergies  Current Outpatient Medications  Medication Sig Dispense Refill  . albuterol (PROVENTIL HFA;VENTOLIN HFA) 108 (90 Base) MCG/ACT inhaler Inhale 2 puffs into the lungs every 6 (six) hours as needed for wheezing or shortness of breath. 1 Inhaler 2  . albuterol (PROVENTIL) (2.5 MG/3ML) 0.083% nebulizer solution Take 3 mLs (2.5 mg total) by nebulization every 6 (six) hours as needed for wheezing or shortness of breath. 75 mL 2  . Ascorbic Acid (VITAMIN C PO) Take 1 tablet by mouth daily.     . budesonide-formoterol (SYMBICORT) 160-4.5 MCG/ACT inhaler Inhale 2 puffs into the lungs 2 (two) times daily.    . Cholecalciferol (VITAMIN D-1000 MAX ST) 1000 UNITS tablet Take 1 tablet by mouth 2 (two) times daily.    . Cyanocobalamin (B-12 PO) Take 1 tablet by mouth daily.     . DULoxetine (CYMBALTA) 60 MG capsule TAKE 1 CAPSULE(60 MG) BY MOUTH DAILY 180 capsule 1  . estradiol (ESTRACE) 0.5 MG  tablet Take 1 tablet (0.5 mg total) by mouth daily. 90 tablet 4  . ferrous sulfate (IRON SUPPLEMENT) 325 (65 FE) MG tablet Take 1 tablet by mouth daily.    . fluticasone (FLONASE) 50 MCG/ACT nasal spray Place 2 sprays into both nostrils as needed. 48 g 1  . gabapentin (NEURONTIN) 300 MG capsule Take 1-3 capsules (300-900 mg total) by mouth 3 (three) times daily. 300 mg twice a day and 900 mg at night 540 capsule 1  . ipratropium (ATROVENT HFA) 17 MCG/ACT inhaler Inhale 2 puffs into the lungs every 6 (six) hours as needed.     . lidocaine (XYLOCAINE) 2 % solution Use as directed 15 mLs in the mouth or throat as needed for mouth pain. 100 mL 0  . Lysine 1000 MG TABS Take 1 tablet by mouth daily.     . magic mouthwash w/lidocaine SOLN Take 5 mLs by mouth 3 (three) times daily. 315 mL 0  .  Magnesium Oxide 500 MG CAPS Take 500 mg by mouth 2 (two) times daily.     . Multiple Vitamins-Minerals (MULTIVITAMIN PO) Take 1 tablet by mouth daily.     . Omega-3 Fatty Acids (FISH OIL PO) Take 1 capsule by mouth 2 (two) times daily.     . potassium gluconate 595 MG TABS tablet Take 595 mg by mouth daily.    . SUMAtriptan (IMITREX) 100 MG tablet May repeat in 2 hours if headache persists or recurs. 27 tablet 0  . temazepam (RESTORIL) 15 MG capsule Take 1 capsule (15 mg total) by mouth at bedtime as needed for sleep. 90 capsule 0  . triamcinolone (KENALOG) 0.1 % paste Use as directed 1 application in the mouth or throat 2 (two) times daily. 5 g 2  . urea (CARMOL) 10 % cream Apply topically as needed. 410 g 1  . valACYclovir (VALTREX) 1000 MG tablet TAKE 1 TABLET(1000 MG) BY MOUTH THREE TIMES DAILY FOR 7 DAYS 21 tablet 0  . cyclobenzaprine (FLEXERIL) 10 MG tablet Take one tablet every 8 hours as needed 30 tablet 1  . oxyCODONE-acetaminophen (PERCOCET) 10-325 MG tablet Take 1 tablet by mouth every 6 (six) hours as needed for pain. 120 tablet 0  . predniSONE (STERAPRED UNI-PAK 21 TAB) 10 MG (21) TBPK tablet Taper as  directed 21 tablet 0   No current facility-administered medications for this visit.    Facility-Administered Medications Ordered in Other Visits  Medication Dose Route Frequency Provider Last Rate Last Dose  . 0.9 %  sodium chloride infusion   Intravenous Once Lloyd Huger, MD      . 0.9 %  sodium chloride infusion   Intravenous Once Lloyd Huger, MD      . dexamethasone (DECADRON) 20 mg in sodium chloride 0.9 % 50 mL IVPB  20 mg Intravenous Once Lloyd Huger, MD      . dexamethasone (DECADRON) injection 10 mg  10 mg Intravenous Once Lloyd Huger, MD      . sodium chloride flush (NS) 0.9 % injection 10 mL  10 mL Intravenous PRN Lloyd Huger, MD   10 mL at 06/16/17 0854    OBJECTIVE: Vitals:   05/18/18 0855  BP: (!) 191/93  Pulse: 93  Temp: 97.6 F (36.4 C)     Body mass index is 30.86 kg/m.    ECOG FS:1 - Symptomatic but completely ambulatory  General: Ill-appearing, no acute distress. Eyes: Pink conjunctiva, anicteric sclera. HEENT: Normocephalic, moist mucous membranes, clear oropharnyx. Lungs: Clear to auscultation bilaterally. Heart: Regular rate and rhythm. No rubs, murmurs, or gallops. Abdomen: Soft, nontender, nondistended. No organomegaly noted, normoactive bowel sounds. Musculoskeletal: Increased edema and upper and lower extremities as well as face. Neuro: Alert, answering all questions appropriately. Cranial nerves grossly intact. Skin: No rashes or petechiae noted. Psych: Normal affect.  LAB RESULTS:  Lab Results  Component Value Date   NA 136 05/18/2018   K 3.6 05/18/2018   CL 103 05/18/2018   CO2 26 05/18/2018   GLUCOSE 164 (H) 05/18/2018   BUN 16 05/18/2018   CREATININE 1.19 (H) 05/18/2018   CALCIUM 8.3 (L) 05/18/2018   PROT 6.2 (L) 05/18/2018   ALBUMIN 3.2 (L) 05/18/2018   AST 29 05/18/2018   ALT 21 05/18/2018   ALKPHOS 70 05/18/2018   BILITOT 0.5 05/18/2018   GFRNONAA 49 (L) 05/18/2018   GFRAA 56 (L) 05/18/2018      Lab Results  Component Value Date  WBC 4.0 05/18/2018   NEUTROABS 1.9 05/18/2018   HGB 10.1 (L) 05/18/2018   HCT 30.3 (L) 05/18/2018   MCV 105.2 (H) 05/18/2018   PLT 288 05/18/2018     STUDIES: Nm Pet Image Initial (pi) Skull Base To Thigh  Result Date: 05/07/2018 CLINICAL DATA:  Subsequent treatment strategy for ovarian cancer. EXAM: NUCLEAR MEDICINE PET SKULL BASE TO THIGH TECHNIQUE: 9.2 mCi F-18 FDG was injected intravenously. Full-ring PET imaging was performed from the skull base to thigh after the radiotracer. CT data was obtained and used for attenuation correction and anatomic localization. Fasting blood glucose: 77 mg/dl COMPARISON:  Multiple exams, including CT scan 02/02/2018 FINDINGS: Mediastinal blood pool activity: SUV max 2.7 NECK: Posterior glottic activity, maximum SUV 6.4, likely physiologic. Incidental CT findings: none CHEST: No significant abnormal hypermetabolic activity in this region. Incidental CT findings: 3 mm right middle lobe subpleural nodule on image 107/3, stable in size without appreciable hypermetabolic activity. Stable small calcified granuloma in the left upper lobe. Right Port-A-Cath tip: SVC. Mild cardiomegaly. Atherosclerotic calcification of the thoracic aorta. ABDOMEN/PELVIS: Significant improvement in nodularity along the left upper quadrant omentum with only very faint residual nodularity for example on image 160/3, notably improved. Maximum SUV in this region of subtle residual omental nodularity 1.5. Incidental CT findings: Cholecystectomy. Aortoiliac atherosclerotic vascular disease. Small ventral supraumbilical hernias contain adipose tissue. SKELETON: Incidental mild accentuation of activity along the right pre-acromial os acromiale and AC joint. No compelling findings of osseous metastatic disease. Incidental CT findings: Graft harvest site from the left iliac bone. Postoperative findings in the lower lumbar spine. Postoperative findings in the  cervical spine. IMPRESSION: 1. Significant improvement and near resolution of the left upper quadrant omental nodularity. Current maximum SUV in this vicinity is 1.5, well below the background blood pool activity level. 2. No new metastatic lesions are observed. 3. Other imaging findings of potential clinical significance: Aortic Atherosclerosis (ICD10-I70.0). Mild cardiomegaly. Small ventral supraumbilical hernias contain adipose tissue. Electronically Signed   By: Van Clines M.D.   On: 05/07/2018 12:51    ONCOLOGY HISTORY: Patient underwent debulking surgery at [facility] on November 19, 2016. She received one infusion preoperative infusion of Keytruda on November 11, 2016 as part of a clinical trial.  She initiated adjuvant carboplatinum and Taxol on December 16, 2016.  This was continued through April 28, 2017 at which time Taxol was discontinued secondary to worsening neuropathy.  Patient then received single agent carboplatinum for additional 2 infusions.  She was then noted to have progression of disease and was switched to Avastin and doxorubicin on August 18, 2017 this was discontinued on November 24, 2017 secondary to declining performance status.  Palliative gemcitabine was initiated on February 20, 2018.   ASSESSMENT: Stage IIIc high-grade serous ovarian carcinoma  PLAN:  1. Stage IIIc high-grade serous ovarian carcinoma: See oncology history as above.  PET scan results from May 07, 2018 reviewed independently with significant improvement in disease burden.  Despite this, CA-125 initially decreased to 44.2, but now has trended up slightly to 52.9.  Patient has inquired about intraperitoneal chemotherapy, but the benefit is unclear with increased toxicity.  Delay cycle 4, day 1 second to decreased performance status and poor appetite.  Return to clinic in 1 week for reconsideration of treatment.     2. Pain: Well controlled.  Continue oxycodone as needed. 3. Anxiety: Chronic and  unchanged.  Continue Xanax as prescribed. 4.  Peripheral neuropathy: Chronic and unchanged.  Continue gabapentin as prescribed.  Patient has agreed to acupuncture.   5.  Anemia: Hemoglobin has trended down slightly and is now 10.1. 6.  Shoulder pain: Patient does not complain of this today.  Continue cortisone injections as needed.  Patient is also indicated she may need rotator cuff surgery in the future.  Okay to proceed from an oncology standpoint, but will have to be coordinated with chemotherapy. 7.  Thrombocytopenia: Resolved. 8.  Neutropenia: Resolved. 9.  Elevated liver enzymes: Resolved. 10.  Tooth pain: Continue follow-up with dentist as scheduled. 11.  Decreased performance status/anasarca: Although patient has received no new medications other than local Novocain for her dental procedure, this appears to be an allergic reaction of some sort and patient was given a Medrol Dosepak. 12.  Elevated creatinine, poor appetite: Patient will receive 1 L of IV fluids today.  Patient expressed understanding and was in agreement with this plan. She also understands that She can call clinic at any time with any questions, concerns, or complaints.   Cancer Staging Ovarian cancer, unspecified laterality ([place]) Staging form: Ovary, Fallopian Tube, and Primary Peritoneal Carcinoma, AJCC 8th Edition - Clinical: No stage assigned - Unsigned   Lloyd Huger, MD   05/19/2018 12:31 PM

## 2018-05-18 NOTE — Progress Notes (Signed)
Patient is here today to follow up on her Primary high grade serous adenocarcinoma of ovary. Patient stated that she started to have edema on her face, hands and feet since this Saturday. Patient stated that her throat is hurting her and that it feels raw. Patient denied fever, chills, nausea, vomiting, diarrhea or constipation. Patient is requesting a refill on her Oxycodone and cyclobenzaprine.

## 2018-05-24 NOTE — Progress Notes (Signed)
Jessica Burgess  [phone] [phone]  ID: [name] OB: [birth year]  MR#: [id number]  AYT#:[id number]  Patient Care Team: Steele Sizer, MD as PCP - General (Family Medicine) Lloyd Huger, MD as Consulting Physician (Oncology) Mellody Drown, MD as Consulting Physician (Obstetrics and Gynecology) Cathi Roan, [facility] (Pharmacist) Benedetto Goad, RN as Case Manager Clent Jacks, RN as Registered Nurse  CHIEF COMPLAINT: Stage IIIc high-grade serous ovarian carcinoma  INTERVAL HISTORY: Patient returns to clinic today for further evaluation and reconsideration of cycle 4, day 1 of single agent gemcitabine.  She currently feels well and is back to her baseline.  She no longer has anasarca and denies any further weakness or fatigue.  She continues to have a mild peripheral neuropathy that is chronic and unchanged. She has no other neurologic complaints.  She denies any recent fevers or illnesses.  She has good appetite and denies weight loss.  She has no chest pain or shortness of breath. She denies any nausea, vomiting, constipation, or diarrhea.  She denies any abdominal pain or bloating.  She has no urinary complaints.  Patient offers no specific complaints today.  REVIEW OF SYSTEMS:   Review of Systems  Constitutional: Negative for fever, malaise/fatigue and weight loss.  HENT: Negative.  Negative for sore throat.   Respiratory: Negative.  Negative for cough and shortness of breath.   Cardiovascular: Negative.  Negative for chest pain and leg swelling.  Gastrointestinal: Negative.  Negative for abdominal pain, blood in stool, constipation, diarrhea, melena, nausea and vomiting.  Genitourinary: Negative.  Negative for dysuria.  Musculoskeletal: Positive for joint pain. Negative for back pain and neck pain.  Skin: Negative.  Negative for itching and rash.  Neurological: Positive for tingling and sensory change. Negative for focal  weakness and weakness.  Psychiatric/Behavioral: Negative.  The patient is not nervous/anxious.     As per HPI. Otherwise, a complete review of systems is negative.  PAST MEDICAL HISTORY: Past Medical History:  Diagnosis Date  . Allergic rhinitis, cause unspecified   . Anxiety state, unspecified   . Arthritis   . Asthma    only when sick   . Backache, unspecified   . Bronchitis    hx of when get sick  . Cancer ([place])    skin cancer , basal cell   . Cancer ([place]) 11/2016   ovarian  . Cervicalgia   . Complication of anesthesia   . Dermatophytosis of nail   . Dysmetabolic syndrome X   . Encounter for long-term (current) use of other medications   . Esophageal reflux   . Insomnia, unspecified   . Leukocytosis, unspecified   . Migraine without aura, without mention of intractable migraine without mention of status migrainosus   . Other and unspecified hyperlipidemia   . Other malaise and fatigue   . Overweight(278.02)   . Personal history of chemotherapy now   ovarian  . PONV (postoperative nausea and vomiting)   . Spinal stenosis in cervical region   . Symptomatic menopausal or female climacteric states   . Unspecified disorder of skin and subcutaneous tissue   . Unspecified vitamin D deficiency     PAST SURGICAL HISTORY: Past Surgical History:  Procedure Laterality Date  . ABDOMINAL HYSTERECTOMY    . ANTERIOR CERVICAL DECOMP/DISCECTOMY FUSION N/A 06/07/2015   Procedure: Cervical three - four and Cervical six- seven anterior cervical decompression with fusion interbody prosthesis plating and bonegraft;  Surgeon: Newman Pies, MD;  Location: [facility]  NEURO ORS;  Service: Neurosurgery;  Laterality: N/A;  C34 and C67 anterior cervical decompression with fusion interbody prosthesis plating and bonegraft  . BACK SURGERY     x2 Lower   . EVACUATION OF CERVICAL HEMATOMA N/A 06/14/2015   Procedure: EVACUATION OF CERVICAL HEMATOMA;  Surgeon: Newman Pies, MD;  Location: [facility] NEURO ORS;   Service: Neurosurgery;  Laterality: N/A;  . NECK SURGERY     x3  . TONSILLECTOMY      FAMILY HISTORY: Family History  Problem Relation Age of Onset  . Depression Mother   . Migraines Mother   . Dementia Father   . Diabetes Father   . Hyperlipidemia Father   . Hyperlipidemia Brother   . Hyperlipidemia Brother   . Breast cancer Paternal Aunt        55s    ADVANCED DIRECTIVES (Y/N):  N  HEALTH MAINTENANCE: Social History   Tobacco Use  . Smoking status: Former Smoker    Packs/day: 1.50    Years: 20.00    Pack years: 30.00    Types: Cigarettes    Start date: 03/19/1979    Last attempt to quit: 08/25/1999    Years since quitting: 18.7  . Smokeless tobacco: Never Used  . Tobacco comment: smoking cessation materials not required  Substance Use Topics  . Alcohol use: Not Currently    Alcohol/week: 0.0 standard drinks  . Drug use: No     Colonoscopy:  PAP:  Bone density:  Lipid panel:  No Known Allergies  Current Outpatient Medications  Medication Sig Dispense Refill  . albuterol (PROVENTIL HFA;VENTOLIN HFA) 108 (90 Base) MCG/ACT inhaler Inhale 2 puffs into the lungs every 6 (six) hours as needed for wheezing or shortness of breath. 1 Inhaler 2  . albuterol (PROVENTIL) (2.5 MG/3ML) 0.083% nebulizer solution Take 3 mLs (2.5 mg total) by nebulization every 6 (six) hours as needed for wheezing or shortness of breath. 75 mL 2  . Ascorbic Acid (VITAMIN C PO) Take 1 tablet by mouth daily.     . budesonide-formoterol (SYMBICORT) 160-4.5 MCG/ACT inhaler Inhale 2 puffs into the lungs 2 (two) times daily.    . Cholecalciferol (VITAMIN D-1000 MAX ST) 1000 UNITS tablet Take 1 tablet by mouth 2 (two) times daily.    . Cyanocobalamin (B-12 PO) Take 1 tablet by mouth daily.     . cyclobenzaprine (FLEXERIL) 10 MG tablet Take one tablet every 8 hours as needed 30 tablet 1  . DULoxetine (CYMBALTA) 60 MG capsule TAKE 1 CAPSULE(60 MG) BY MOUTH DAILY 180 capsule 1  . estradiol (ESTRACE) 0.5  MG tablet Take 1 tablet (0.5 mg total) by mouth daily. 90 tablet 4  . ferrous sulfate (IRON SUPPLEMENT) 325 (65 FE) MG tablet Take 1 tablet by mouth daily.    . fluticasone (FLONASE) 50 MCG/ACT nasal spray Place 2 sprays into both nostrils as needed. 48 g 1  . gabapentin (NEURONTIN) 300 MG capsule Take 1-3 capsules (300-900 mg total) by mouth 3 (three) times daily. 300 mg twice a day and 900 mg at night 540 capsule 1  . ipratropium (ATROVENT HFA) 17 MCG/ACT inhaler Inhale 2 puffs into the lungs every 6 (six) hours as needed.     . lidocaine (XYLOCAINE) 2 % solution Use as directed 15 mLs in the mouth or throat as needed for mouth pain. 100 mL 0  . Lysine 1000 MG TABS Take 1 tablet by mouth daily.     . magic mouthwash w/lidocaine SOLN Take 5 mLs  by mouth 3 (three) times daily. 315 mL 0  . Magnesium Oxide 500 MG CAPS Take 500 mg by mouth 2 (two) times daily.     . Multiple Vitamins-Minerals (MULTIVITAMIN PO) Take 1 tablet by mouth daily.     . Omega-3 Fatty Acids (FISH OIL PO) Take 1 capsule by mouth 2 (two) times daily.     Marland Kitchen oxyCODONE-acetaminophen (PERCOCET) 10-325 MG tablet Take 1 tablet by mouth every 6 (six) hours as needed for pain. 120 tablet 0  . potassium gluconate 595 MG TABS tablet Take 595 mg by mouth daily.    . predniSONE (STERAPRED UNI-PAK 21 TAB) 10 MG (21) TBPK tablet Taper as directed 21 tablet 0  . SUMAtriptan (IMITREX) 100 MG tablet May repeat in 2 hours if headache persists or recurs. 27 tablet 0  . temazepam (RESTORIL) 15 MG capsule Take 1 capsule (15 mg total) by mouth at bedtime as needed for sleep. 90 capsule 0  . triamcinolone (KENALOG) 0.1 % paste Use as directed 1 application in the mouth or throat 2 (two) times daily. 5 g 2  . urea (CARMOL) 10 % cream Apply topically as needed. 410 g 1  . valACYclovir (VALTREX) 1000 MG tablet TAKE 1 TABLET(1000 MG) BY MOUTH THREE TIMES DAILY FOR 7 DAYS 21 tablet 0   No current facility-administered medications for this visit.     Facility-Administered Medications Ordered in Other Visits  Medication Dose Route Frequency Provider Last Rate Last Dose  . 0.9 %  sodium chloride infusion   Intravenous Once Lloyd Huger, MD      . 0.9 %  sodium chloride infusion   Intravenous Once Lloyd Huger, MD      . dexamethasone (DECADRON) 20 mg in sodium chloride 0.9 % 50 mL IVPB  20 mg Intravenous Once Lloyd Huger, MD      . dexamethasone (DECADRON) injection 10 mg  10 mg Intravenous Once Lloyd Huger, MD      . gemcitabine (GEMZAR) 1,600 mg in sodium chloride 0.9 % 250 mL chemo infusion  1,600 mg Intravenous Once Lloyd Huger, MD 584 mL/hr at 05/25/18 1408 1,600 mg at 05/25/18 1408  . heparin lock flush 100 unit/mL  500 Units Intravenous Once Lloyd Huger, MD      . sodium chloride flush (NS) 0.9 % injection 10 mL  10 mL Intravenous PRN Lloyd Huger, MD   10 mL at 06/16/17 0854  . sodium chloride flush (NS) 0.9 % injection 10 mL  10 mL Intravenous PRN Lloyd Huger, MD        OBJECTIVE: Vitals:   05/25/18 1317  BP: (!) 149/98  Pulse: 84  Temp: (!) 97.4 F (36.3 C)     Body mass index is 30.25 kg/m.    ECOG FS:0 - Asymptomatic  General: Well-developed, well-nourished, no acute distress. Eyes: Pink conjunctiva, anicteric sclera. HEENT: Normocephalic, moist mucous membranes. Lungs: Clear to auscultation bilaterally. Heart: Regular rate and rhythm. No rubs, murmurs, or gallops. Abdomen: Soft, nontender, nondistended. No organomegaly noted, normoactive bowel sounds. Musculoskeletal: No edema, cyanosis, or clubbing. Neuro: Alert, answering all questions appropriately. Cranial nerves grossly intact. Skin: No rashes or petechiae noted. Psych: Normal affect.  LAB RESULTS:  Lab Results  Component Value Date   NA 136 05/25/2018   K 4.2 05/25/2018   CL 101 05/25/2018   CO2 27 05/25/2018   GLUCOSE 92 05/25/2018   BUN 25 (H) 05/25/2018   CREATININE 1.10 (H) 05/25/2018  CALCIUM 8.6 (L) 05/25/2018   PROT 7.0 05/25/2018   ALBUMIN 4.0 05/25/2018   AST 23 05/25/2018   ALT 17 05/25/2018   ALKPHOS 89 05/25/2018   BILITOT 0.5 05/25/2018   GFRNONAA 53 (L) 05/25/2018   GFRAA >60 05/25/2018    Lab Results  Component Value Date   WBC 6.6 05/25/2018   NEUTROABS 3.0 05/25/2018   HGB 11.9 (L) 05/25/2018   HCT 34.2 (L) 05/25/2018   MCV 103.0 (H) 05/25/2018   PLT 178 05/25/2018     STUDIES: Nm Pet Image Initial (pi) Skull Base To Thigh  Result Date: 05/07/2018 CLINICAL DATA:  Subsequent treatment strategy for ovarian cancer. EXAM: NUCLEAR MEDICINE PET SKULL BASE TO THIGH TECHNIQUE: 9.2 mCi F-18 FDG was injected intravenously. Full-ring PET imaging was performed from the skull base to thigh after the radiotracer. CT data was obtained and used for attenuation correction and anatomic localization. Fasting blood glucose: 77 mg/dl COMPARISON:  Multiple exams, including CT scan 02/02/2018 FINDINGS: Mediastinal blood pool activity: SUV max 2.7 NECK: Posterior glottic activity, maximum SUV 6.4, likely physiologic. Incidental CT findings: none CHEST: No significant abnormal hypermetabolic activity in this region. Incidental CT findings: 3 mm right middle lobe subpleural nodule on image 107/3, stable in size without appreciable hypermetabolic activity. Stable small calcified granuloma in the left upper lobe. Right Port-A-Cath tip: SVC. Mild cardiomegaly. Atherosclerotic calcification of the thoracic aorta. ABDOMEN/PELVIS: Significant improvement in nodularity along the left upper quadrant omentum with only very faint residual nodularity for example on image 160/3, notably improved. Maximum SUV in this region of subtle residual omental nodularity 1.5. Incidental CT findings: Cholecystectomy. Aortoiliac atherosclerotic vascular disease. Small ventral supraumbilical hernias contain adipose tissue. SKELETON: Incidental mild accentuation of activity along the right pre-acromial os  acromiale and AC joint. No compelling findings of osseous metastatic disease. Incidental CT findings: Graft harvest site from the left iliac bone. Postoperative findings in the lower lumbar spine. Postoperative findings in the cervical spine. IMPRESSION: 1. Significant improvement and near resolution of the left upper quadrant omental nodularity. Current maximum SUV in this vicinity is 1.5, well below the background blood pool activity level. 2. No new metastatic lesions are observed. 3. Other imaging findings of potential clinical significance: Aortic Atherosclerosis (ICD10-I70.0). Mild cardiomegaly. Small ventral supraumbilical hernias contain adipose tissue. Electronically Signed   By: Van Clines M.D.   On: 05/07/2018 12:51    ONCOLOGY HISTORY: Patient underwent debulking surgery at [facility] on November 19, 2016. She received one infusion preoperative infusion of Keytruda on November 11, 2016 as part of a clinical trial.  She initiated adjuvant carboplatinum and Taxol on December 16, 2016.  This was continued through April 28, 2017 at which time Taxol was discontinued secondary to worsening neuropathy.  Patient then received single agent carboplatinum for additional 2 infusions.  She was then noted to have progression of disease and was switched to Avastin and doxorubicin on August 18, 2017 this was discontinued on November 24, 2017 secondary to declining performance status.  Palliative gemcitabine was initiated on February 20, 2018.   ASSESSMENT: Stage IIIc high-grade serous ovarian carcinoma  PLAN:  1. Stage IIIc high-grade serous ovarian carcinoma: See oncology history as above.  PET scan results from May 07, 2018 reviewed independently with significant improvement in disease burden.  Despite this, CA-125 initially decreased to 44.2, but now has trended up slightly to 52.9.  Today's result is pending.  Patient has inquired about intraperitoneal chemotherapy, but the benefit is unclear  with increased  toxicity.  Proceed with cycle 4, day 1 of gemcitabine today.  Return to clinic in 1 week for further evaluation and consideration of cycle 4, day 8. 2. Pain: Well controlled.  Continue oxycodone as needed. 3. Anxiety: Chronic and unchanged.  Continue Xanax as prescribed. 4.  Peripheral neuropathy: Chronic and unchanged.  Continue gabapentin as prescribed.  Patient has agreed to acupuncture.   5.  Anemia: Hemoglobin is improved to 11.9. 6.  Shoulder pain: Patient does not complain of this today.  Continue cortisone injections as needed.  Patient is also indicated she may need rotator cuff surgery in the future.  Okay to proceed from an oncology standpoint, but will have to be coordinated with chemotherapy. 7.  Thrombocytopenia: Resolved. 8.  Neutropenia: Resolved. 9.  Elevated liver enzymes: Resolved. 10.  Tooth pain: Continue follow-up with dentist as scheduled. 11.  Decreased performance status/anasarca: Resolved. 12.  Elevated creatinine, poor appetite: Improved.  Patient expressed understanding and was in agreement with this plan. She also understands that She can call clinic at any time with any questions, concerns, or complaints.   Cancer Staging Ovarian cancer, unspecified laterality ([place]) Staging form: Ovary, Fallopian Tube, and Primary Peritoneal Carcinoma, AJCC 8th Edition - Clinical: No stage assigned - Unsigned   Lloyd Huger, MD   05/25/2018 2:32 PM

## 2018-05-25 ENCOUNTER — Inpatient Hospital Stay (HOSPITAL_BASED_OUTPATIENT_CLINIC_OR_DEPARTMENT_OTHER): Payer: PPO | Admitting: Oncology

## 2018-05-25 ENCOUNTER — Inpatient Hospital Stay: Payer: PPO

## 2018-05-25 ENCOUNTER — Other Ambulatory Visit: Payer: Self-pay

## 2018-05-25 ENCOUNTER — Encounter: Payer: Self-pay | Admitting: Oncology

## 2018-05-25 VITALS — BP 149/98 | HR 84 | Temp 97.4°F | Ht 62.0 in | Wt 165.4 lb

## 2018-05-25 DIAGNOSIS — C569 Malignant neoplasm of unspecified ovary: Secondary | ICD-10-CM

## 2018-05-25 DIAGNOSIS — D649 Anemia, unspecified: Secondary | ICD-10-CM

## 2018-05-25 DIAGNOSIS — Z87891 Personal history of nicotine dependence: Secondary | ICD-10-CM

## 2018-05-25 DIAGNOSIS — Z5111 Encounter for antineoplastic chemotherapy: Secondary | ICD-10-CM | POA: Diagnosis not present

## 2018-05-25 LAB — COMPREHENSIVE METABOLIC PANEL
ALT: 17 U/L (ref 0–44)
AST: 23 U/L (ref 15–41)
Albumin: 4 g/dL (ref 3.5–5.0)
Alkaline Phosphatase: 89 U/L (ref 38–126)
Anion gap: 8 (ref 5–15)
BUN: 25 mg/dL — ABNORMAL HIGH (ref 8–23)
CO2: 27 mmol/L (ref 22–32)
Calcium: 8.6 mg/dL — ABNORMAL LOW (ref 8.9–10.3)
Chloride: 101 mmol/L (ref 98–111)
Creatinine, Ser: 1.1 mg/dL — ABNORMAL HIGH (ref 0.44–1.00)
GFR calc Af Amer: >60 mL/min (ref >60)
GFR, EST NON AFRICAN AMERICAN: 53 mL/min — AB (ref >60)
Glucose, Bld: 92 mg/dL (ref 70–99)
Potassium: 4.2 mmol/L (ref 3.5–5.1)
Sodium: 136 mmol/L (ref 135–145)
Total Bilirubin: 0.5 mg/dL (ref 0.3–1.2)
Total Protein: 7 g/dL (ref 6.5–8.1)

## 2018-05-25 LAB — CBC WITH DIFFERENTIAL/PLATELET
ABS IMMATURE GRANULOCYTES: 0.17 10*3/uL — AB (ref 0.00–0.07)
Basophils Absolute: 0 10*3/uL (ref 0.0–0.1)
Basophils Relative: 1 %
EOS ABS: 0.4 10*3/uL (ref 0.0–0.5)
Eosinophils Relative: 6 %
HEMATOCRIT: 34.2 % — AB (ref 36.0–46.0)
Hemoglobin: 11.9 g/dL — ABNORMAL LOW (ref 12.0–15.0)
Immature Granulocytes: 3 %
Lymphocytes Relative: 31 %
Lymphs Abs: 2.1 10*3/uL (ref 0.7–4.0)
MCH: 35.8 pg — ABNORMAL HIGH (ref 26.0–34.0)
MCHC: 34.8 g/dL (ref 30.0–36.0)
MCV: 103 fL — ABNORMAL HIGH (ref 80.0–100.0)
Monocytes Absolute: 1 10*3/uL (ref 0.1–1.0)
Monocytes Relative: 15 %
Neutro Abs: 3 10*3/uL (ref 1.7–7.7)
Neutrophils Relative %: 44 %
Platelets: 178 10*3/uL (ref 150–400)
RBC: 3.32 MIL/uL — ABNORMAL LOW (ref 3.87–5.11)
RDW: 15 % (ref 11.5–15.5)
WBC: 6.6 10*3/uL (ref 4.0–10.5)
nRBC: 0 % (ref 0.0–0.2)

## 2018-05-25 MED ORDER — PROCHLORPERAZINE MALEATE 10 MG PO TABS
10.0000 mg | ORAL_TABLET | Freq: Once | ORAL | Status: AC
  Administered 2018-05-25: 10 mg via ORAL
  Filled 2018-05-25: qty 1

## 2018-05-25 MED ORDER — SODIUM CHLORIDE 0.9 % IV SOLN
1600.0000 mg | Freq: Once | INTRAVENOUS | Status: AC
  Administered 2018-05-25: 1600 mg via INTRAVENOUS
  Filled 2018-05-25: qty 26.3

## 2018-05-25 MED ORDER — HEPARIN SOD (PORK) LOCK FLUSH 100 UNIT/ML IV SOLN
500.0000 [IU] | Freq: Once | INTRAVENOUS | Status: AC
  Administered 2018-05-25: 500 [IU] via INTRAVENOUS
  Filled 2018-05-25: qty 5

## 2018-05-25 MED ORDER — SODIUM CHLORIDE 0.9 % IV SOLN
Freq: Once | INTRAVENOUS | Status: AC
  Administered 2018-05-25: 14:00:00 via INTRAVENOUS
  Filled 2018-05-25: qty 250

## 2018-05-25 MED ORDER — SODIUM CHLORIDE 0.9% FLUSH
10.0000 mL | INTRAVENOUS | Status: DC | PRN
  Administered 2018-05-25: 10 mL via INTRAVENOUS
  Filled 2018-05-25: qty 10

## 2018-05-25 NOTE — Progress Notes (Signed)
Patient is here today to follow up on her Primary high grade serous adenocarcinoma of ovary. Patient stated that she usually gets urine tested and she had not had one. Patient would like to know if she needs it done or not.

## 2018-05-26 ENCOUNTER — Other Ambulatory Visit: Payer: PPO

## 2018-05-26 LAB — CA 125: Cancer Antigen (CA) 125: 61.3 U/mL — ABNORMAL HIGH (ref 0.0–38.1)

## 2018-05-28 ENCOUNTER — Encounter: Payer: Self-pay | Admitting: *Deleted

## 2018-05-29 ENCOUNTER — Ambulatory Visit: Payer: PPO | Admitting: Oncology

## 2018-06-01 ENCOUNTER — Other Ambulatory Visit: Payer: Self-pay

## 2018-06-01 ENCOUNTER — Inpatient Hospital Stay (HOSPITAL_BASED_OUTPATIENT_CLINIC_OR_DEPARTMENT_OTHER): Payer: PPO | Admitting: Oncology

## 2018-06-01 ENCOUNTER — Inpatient Hospital Stay: Payer: PPO

## 2018-06-01 ENCOUNTER — Encounter: Payer: Self-pay | Admitting: Oncology

## 2018-06-01 VITALS — BP 169/93 | HR 75 | Temp 97.4°F | Resp 18 | Wt 160.8 lb

## 2018-06-01 DIAGNOSIS — Z5111 Encounter for antineoplastic chemotherapy: Secondary | ICD-10-CM | POA: Diagnosis not present

## 2018-06-01 DIAGNOSIS — D696 Thrombocytopenia, unspecified: Secondary | ICD-10-CM

## 2018-06-01 DIAGNOSIS — C569 Malignant neoplasm of unspecified ovary: Secondary | ICD-10-CM | POA: Diagnosis not present

## 2018-06-01 DIAGNOSIS — M25519 Pain in unspecified shoulder: Secondary | ICD-10-CM | POA: Diagnosis not present

## 2018-06-01 DIAGNOSIS — D649 Anemia, unspecified: Secondary | ICD-10-CM | POA: Diagnosis not present

## 2018-06-01 DIAGNOSIS — Z87891 Personal history of nicotine dependence: Secondary | ICD-10-CM | POA: Diagnosis not present

## 2018-06-01 DIAGNOSIS — G629 Polyneuropathy, unspecified: Secondary | ICD-10-CM | POA: Diagnosis not present

## 2018-06-01 LAB — COMPREHENSIVE METABOLIC PANEL
ALBUMIN: 3.7 g/dL (ref 3.5–5.0)
ALT: 38 U/L (ref 0–44)
AST: 52 U/L — ABNORMAL HIGH (ref 15–41)
Alkaline Phosphatase: 72 U/L (ref 38–126)
Anion gap: 6 (ref 5–15)
BUN: 21 mg/dL (ref 8–23)
CO2: 27 mmol/L (ref 22–32)
Calcium: 8.6 mg/dL — ABNORMAL LOW (ref 8.9–10.3)
Chloride: 103 mmol/L (ref 98–111)
Creatinine, Ser: 0.95 mg/dL (ref 0.44–1.00)
GFR calc Af Amer: >60 mL/min (ref >60)
GFR calc non Af Amer: >60 mL/min (ref >60)
GLUCOSE: 106 mg/dL — AB (ref 70–99)
Potassium: 3.9 mmol/L (ref 3.5–5.1)
Sodium: 136 mmol/L (ref 135–145)
Total Bilirubin: 0.6 mg/dL (ref 0.3–1.2)
Total Protein: 6.9 g/dL (ref 6.5–8.1)

## 2018-06-01 LAB — CBC WITH DIFFERENTIAL/PLATELET
Abs Immature Granulocytes: 0.19 10*3/uL — ABNORMAL HIGH (ref 0.00–0.07)
Basophils Absolute: 0 10*3/uL (ref 0.0–0.1)
Basophils Relative: 1 %
EOS ABS: 0 10*3/uL (ref 0.0–0.5)
Eosinophils Relative: 0 %
HCT: 31.4 % — ABNORMAL LOW (ref 36.0–46.0)
Hemoglobin: 10.8 g/dL — ABNORMAL LOW (ref 12.0–15.0)
Immature Granulocytes: 3 %
Lymphocytes Relative: 33 %
Lymphs Abs: 2 10*3/uL (ref 0.7–4.0)
MCH: 35 pg — ABNORMAL HIGH (ref 26.0–34.0)
MCHC: 34.4 g/dL (ref 30.0–36.0)
MCV: 101.6 fL — ABNORMAL HIGH (ref 80.0–100.0)
Monocytes Absolute: 0.6 10*3/uL (ref 0.1–1.0)
Monocytes Relative: 9 %
NEUTROS PCT: 54 %
Neutro Abs: 3.1 10*3/uL (ref 1.7–7.7)
Platelets: 81 10*3/uL — ABNORMAL LOW (ref 150–400)
RBC: 3.09 MIL/uL — ABNORMAL LOW (ref 3.87–5.11)
RDW: 13.3 % (ref 11.5–15.5)
WBC: 5.9 10*3/uL (ref 4.0–10.5)
nRBC: 0.3 % — ABNORMAL HIGH (ref 0.0–0.2)

## 2018-06-01 MED ORDER — SODIUM CHLORIDE 0.9% FLUSH
10.0000 mL | INTRAVENOUS | Status: DC | PRN
  Filled 2018-06-01: qty 10

## 2018-06-01 MED ORDER — PROCHLORPERAZINE MALEATE 10 MG PO TABS
10.0000 mg | ORAL_TABLET | Freq: Once | ORAL | Status: AC
  Administered 2018-06-01: 10 mg via ORAL
  Filled 2018-06-01: qty 1

## 2018-06-01 MED ORDER — SODIUM CHLORIDE 0.9 % IV SOLN
Freq: Once | INTRAVENOUS | Status: AC
  Administered 2018-06-01: 10:00:00 via INTRAVENOUS
  Filled 2018-06-01: qty 250

## 2018-06-01 MED ORDER — SODIUM CHLORIDE 0.9 % IV SOLN
1600.0000 mg | Freq: Once | INTRAVENOUS | Status: AC
  Administered 2018-06-01: 1600 mg via INTRAVENOUS
  Filled 2018-06-01: qty 26.3

## 2018-06-01 MED ORDER — HEPARIN SOD (PORK) LOCK FLUSH 100 UNIT/ML IV SOLN
500.0000 [IU] | Freq: Once | INTRAVENOUS | Status: AC
  Administered 2018-06-01: 500 [IU] via INTRAVENOUS
  Filled 2018-06-01: qty 5

## 2018-06-01 MED ORDER — SODIUM CHLORIDE 0.9 % IV SOLN
Freq: Once | INTRAVENOUS | Status: AC
  Filled 2018-06-01: qty 250

## 2018-06-01 NOTE — Progress Notes (Signed)
Patient receiving treatment today, labs reviewed by Dr. Grayland Ormond, proceed per Dr. Grayland Ormond

## 2018-06-01 NOTE — Progress Notes (Signed)
[place]  [phone]) [phone] Fax:(336) [phone]  ID: Jessica Burgess OB: 1956-03-09  MR#: [id number]  KDT#:[id number]  Patient Care Team: Steele Sizer, MD as PCP - General (Family Medicine) Lloyd Huger, MD as Consulting Physician (Oncology) Mellody Drown, MD as Consulting Physician (Obstetrics and Gynecology) Cathi Roan, [facility] (Pharmacist) Benedetto Goad, RN as Case Manager Clent Jacks, RN as Registered Nurse  CHIEF COMPLAINT: Stage IIIc high-grade serous ovarian carcinoma  INTERVAL HISTORY: Patient returns to clinic today for further evaluation and consideration of cycle 4, day 8 of single agent gemcitabine.  She currently feels well and is asymptomatic.  She denies any weakness or fatigue today.  She continues to have a mild peripheral neuropathy that is chronic and unchanged. She has no other neurologic complaints.  She denies any recent fevers or illnesses.  She has good appetite and denies weight loss.  She has no chest pain or shortness of breath. She denies any nausea, vomiting, constipation, or diarrhea.  She denies any abdominal pain or bloating.  She has no urinary complaints.  Patient feels at her baseline offers no specific complaints today.  REVIEW OF SYSTEMS:   Review of Systems  Constitutional: Negative for fever, malaise/fatigue and weight loss.  HENT: Negative.  Negative for sore throat.   Respiratory: Negative.  Negative for cough and shortness of breath.   Cardiovascular: Negative.  Negative for chest pain and leg swelling.  Gastrointestinal: Negative.  Negative for abdominal pain, blood in stool, constipation, diarrhea, melena, nausea and vomiting.  Genitourinary: Negative.  Negative for dysuria.  Musculoskeletal: Positive for joint pain. Negative for back pain and neck pain.  Skin: Negative.  Negative for itching and rash.  Neurological: Positive for tingling and sensory change. Negative for focal weakness and  weakness.  Psychiatric/Behavioral: Negative.  The patient is not nervous/anxious.     As per HPI. Otherwise, a complete review of systems is negative.  PAST MEDICAL HISTORY: Past Medical History:  Diagnosis Date   Allergic rhinitis, cause unspecified    Anxiety state, unspecified    Arthritis    Asthma    only when sick    Backache, unspecified    Bronchitis    hx of when get sick   Cancer [facility])    skin cancer , basal cell    Cancer ([place]) 11/2016   ovarian   Cervicalgia    Complication of anesthesia    Dermatophytosis of nail    Dysmetabolic syndrome X    Encounter for long-term (current) use of other medications    Esophageal reflux    Insomnia, unspecified    Leukocytosis, unspecified    Migraine without aura, without mention of intractable migraine without mention of status migrainosus    Other and unspecified hyperlipidemia    Other malaise and fatigue    Overweight(278.02)    Personal history of chemotherapy now   ovarian   PONV (postoperative nausea and vomiting)    Spinal stenosis in cervical region    Symptomatic menopausal or female climacteric states    Unspecified disorder of skin and subcutaneous tissue    Unspecified vitamin D deficiency     PAST SURGICAL HISTORY: Past Surgical History:  Procedure Laterality Date   ABDOMINAL HYSTERECTOMY     ANTERIOR CERVICAL DECOMP/DISCECTOMY FUSION N/A 06/07/2015   Procedure: Cervical three - four and Cervical six- seven anterior cervical decompression with fusion interbody prosthesis plating and bonegraft;  Surgeon: Newman Pies, MD;  Location: [place] [facility]UR[facility] OR[facility];  Service:  Neurosurgery;  Laterality: N/A;  C34 and C67 anterior cervical decompression with fusion interbody prosthesis plating and bonegraft   BACK SURGERY     x2 Lower    EVACUATION OF CERVICAL HEMATOMA N/A 06/14/2015   Procedure: EVACUATION OF CERVICAL HEMATOMA;  Surgeon: Newman Pies, MD;  Location: [place] NEURO ORS;  Service:  Neurosurgery;  Laterality: N/A;   NECK SURGERY     x3   TONSILLECTOMY      FAMILY HISTORY: Family History  Problem Relation Age of Onset   Depression Mother    Migraines Mother    Dementia Father    Diabetes Father    Hyperlipidemia Father    Hyperlipidemia Brother    Hyperlipidemia Brother    Breast cancer Paternal Aunt        44s    ADVANCED DIRECTIVES (Y/N):  N  HEALTH MAINTENANCE: Social History   Tobacco Use   Smoking status: Former Smoker    Packs/day: 1.50    Years: 20.00    Pack years: 30.00    Types: Cigarettes    Start date: 03/19/1979    Last attempt to quit: 08/25/1999    Years since quitting: 18.7   Smokeless tobacco: Never Used   Tobacco comment: smoking cessation materials not required  Substance Use Topics   Alcohol use: Not Currently    Alcohol/week: 0.0 standard drinks   Drug use: No     Colonoscopy:  PAP:  Bone density:  Lipid panel:  No Known Allergies  Current Outpatient Medications  Medication Sig Dispense Refill   albuterol (PROVENTIL HFA;VENTOLIN HFA) 108 (90 Base) MCG/ACT inhaler Inhale 2 puffs into the lungs every 6 (six) hours as needed for wheezing or shortness of breath. 1 Inhaler 2   albuterol (PROVENTIL) (2.5 MG/3ML) 0.083% nebulizer solution Take 3 mLs (2.5 mg total) by nebulization every 6 (six) hours as needed for wheezing or shortness of breath. 75 mL 2   Ascorbic Acid (VITAMIN C PO) Take 1 tablet by mouth daily.      budesonide-formoterol (SYMBICORT) 160-4.5 MCG/ACT inhaler Inhale 2 puffs into the lungs 2 (two) times daily.     Cholecalciferol (VITAMIN D-1000 MAX ST) 1000 UNITS tablet Take 1 tablet by mouth 2 (two) times daily.     Cyanocobalamin (B-12 PO) Take 1 tablet by mouth daily.      cyclobenzaprine (FLEXERIL) 10 MG tablet Take one tablet every 8 hours as needed 30 tablet 1   DULoxetine (CYMBALTA) 60 MG capsule TAKE 1 CAPSULE(60 MG) BY MOUTH DAILY 180 capsule 1   estradiol (ESTRACE) 0.5 MG tablet  Take 1 tablet (0.5 mg total) by mouth daily. 90 tablet 4   ferrous sulfate (IRON SUPPLEMENT) 325 (65 FE) MG tablet Take 1 tablet by mouth daily.     fluticasone (FLONASE) 50 MCG/ACT nasal spray Place 2 sprays into both nostrils as needed. 48 g 1   gabapentin (NEURONTIN) 300 MG capsule Take 1-3 capsules (300-900 mg total) by mouth 3 (three) times daily. 300 mg twice a day and 900 mg at night 540 capsule 1   ipratropium (ATROVENT HFA) 17 MCG/ACT inhaler Inhale 2 puffs into the lungs every 6 (six) hours as needed.      lidocaine (XYLOCAINE) 2 % solution Use as directed 15 mLs in the mouth or throat as needed for mouth pain. 100 mL 0   Lysine 1000 MG TABS Take 1 tablet by mouth daily.      magic mouthwash w/lidocaine SOLN Take 5 mLs by mouth 3 (three)  times daily. 315 mL 0   Magnesium Oxide 500 MG CAPS Take 500 mg by mouth 2 (two) times daily.      Multiple Vitamins-Minerals (MULTIVITAMIN PO) Take 1 tablet by mouth daily.      Omega-3 Fatty Acids (FISH OIL PO) Take 1 capsule by mouth 2 (two) times daily.      oxyCODONE-acetaminophen (PERCOCET) 10-325 MG tablet Take 1 tablet by mouth every 6 (six) hours as needed for pain. 120 tablet 0   potassium gluconate 595 MG TABS tablet Take 595 mg by mouth daily.     predniSONE (STERAPRED UNI-PAK 21 TAB) 10 MG (21) TBPK tablet Taper as directed 21 tablet 0   SUMAtriptan (IMITREX) 100 MG tablet May repeat in 2 hours if headache persists or recurs. 27 tablet 0   temazepam (RESTORIL) 15 MG capsule Take 1 capsule (15 mg total) by mouth at bedtime as needed for sleep. 90 capsule 0   triamcinolone (KENALOG) 0.1 % paste Use as directed 1 application in the mouth or throat 2 (two) times daily. 5 g 2   urea (CARMOL) 10 % cream Apply topically as needed. 410 g 1   valACYclovir (VALTREX) 1000 MG tablet TAKE 1 TABLET(1000 MG) BY MOUTH THREE TIMES DAILY FOR 7 DAYS 21 tablet 0   No current facility-administered medications for this visit.     Facility-Administered Medications Ordered in Other Visits  Medication Dose Route Frequency Provider Last Rate Last Dose   0.9 %  sodium chloride infusion   Intravenous Once Grayland Ormond, Kathlene November, MD       0.9 %  sodium chloride infusion   Intravenous Once Grayland Ormond, Kathlene November, MD       dexamethasone (DECADRON) 20 mg in sodium chloride 0.9 % 50 mL IVPB  20 mg Intravenous Once Lloyd Huger, MD       dexamethasone (DECADRON) injection 10 mg  10 mg Intravenous Once Lloyd Huger, MD       sodium chloride flush (NS) 0.9 % injection 10 mL  10 mL Intravenous PRN Lloyd Huger, MD   10 mL at 06/16/17 0854   sodium chloride flush (NS) 0.9 % injection 10 mL  10 mL Intravenous PRN Lloyd Huger, MD        OBJECTIVE: Vitals:   06/01/18 0913  BP: (!) 169/93  Pulse: 75  Resp: 18  Temp: (!) 97.4 F (36.3 C)     Body mass index is 29.41 kg/m.    ECOG FS:0 - Asymptomatic  General: Well-developed, well-nourished, no acute distress. Eyes: Pink conjunctiva, anicteric sclera. HEENT: Normocephalic, moist mucous membranes. Lungs: Clear to auscultation bilaterally. Heart: Regular rate and rhythm. No rubs, murmurs, or gallops. Abdomen: Soft, nontender, nondistended. No organomegaly noted, normoactive bowel sounds. Musculoskeletal: No edema, cyanosis, or clubbing. Neuro: Alert, answering all questions appropriately. Cranial nerves grossly intact. Skin: No rashes or petechiae noted. Psych: Normal affect.  LAB RESULTS:  Lab Results  Component Value Date   NA 136 06/01/2018   K 3.9 06/01/2018   CL 103 06/01/2018   CO2 27 06/01/2018   GLUCOSE 106 (H) 06/01/2018   BUN 21 06/01/2018   CREATININE 0.95 06/01/2018   CALCIUM 8.6 (L) 06/01/2018   PROT 6.9 06/01/2018   ALBUMIN 3.7 06/01/2018   AST 52 (H) 06/01/2018   ALT 38 06/01/2018   ALKPHOS 72 06/01/2018   BILITOT 0.6 06/01/2018   GFRNONAA >60 06/01/2018   GFRAA >60 06/01/2018    Lab Results  Component Value Date  WBC 5.9 06/01/2018   NEUTROABS 3.1 06/01/2018   HGB 10.8 (L) 06/01/2018   HCT 31.4 (L) 06/01/2018   MCV 101.6 (H) 06/01/2018   PLT 81 (L) 06/01/2018     STUDIES: Nm Pet Image Initial (pi) Skull Base To Thigh  Result Date: 05/07/2018 CLINICAL DATA:  Subsequent treatment strategy for ovarian cancer. EXAM: NUCLEAR MEDICINE PET SKULL BASE TO THIGH TECHNIQUE: 9.2 mCi F-18 FDG was injected intravenously. Full-ring PET imaging was performed from the skull base to thigh after the radiotracer. CT data was obtained and used for attenuation correction and anatomic localization. Fasting blood glucose: 77 mg/dl COMPARISON:  Multiple exams, including CT scan 02/02/2018 FINDINGS: Mediastinal blood pool activity: SUV max 2.7 NECK: Posterior glottic activity, maximum SUV 6.4, likely physiologic. Incidental CT findings: none CHEST: No significant abnormal hypermetabolic activity in this region. Incidental CT findings: 3 mm right middle lobe subpleural nodule on image 107/3, stable in size without appreciable hypermetabolic activity. Stable small calcified granuloma in the left upper lobe. Right Port-A-Cath tip: SVC. Mild cardiomegaly. Atherosclerotic calcification of the thoracic aorta. ABDOMEN/PELVIS: Significant improvement in nodularity along the left upper quadrant omentum with only very faint residual nodularity for example on image 160/3, notably improved. Maximum SUV in this region of subtle residual omental nodularity 1.5. Incidental CT findings: Cholecystectomy. Aortoiliac atherosclerotic vascular disease. Small ventral supraumbilical hernias contain adipose tissue. SKELETON: Incidental mild accentuation of activity along the right pre-acromial os acromiale and AC joint. No compelling findings of osseous metastatic disease. Incidental CT findings: Graft harvest site from the left iliac bone. Postoperative findings in the lower lumbar spine. Postoperative findings in the cervical spine. IMPRESSION: 1.  Significant improvement and near resolution of the left upper quadrant omental nodularity. Current maximum SUV in this vicinity is 1.5, well below the background blood pool activity level. 2. No new metastatic lesions are observed. 3. Other imaging findings of potential clinical significance: Aortic Atherosclerosis (ICD10-I70.0). Mild cardiomegaly. Small ventral supraumbilical hernias contain adipose tissue. Electronically Signed   By: Van Clines M.D.   On: 05/07/2018 12:51    ONCOLOGY HISTORY: Patient underwent debulking surgery at [facility] on November 19, 2016. She received one infusion preoperative infusion of Keytruda on November 11, 2016 as part of a clinical trial.  She initiated adjuvant carboplatinum and Taxol on December 16, 2016.  This was continued through April 28, 2017 at which time Taxol was discontinued secondary to worsening neuropathy.  Patient then received single agent carboplatinum for additional 2 infusions.  She was then noted to have progression of disease and was switched to Avastin and doxorubicin on August 18, 2017 this was discontinued on November 24, 2017 secondary to declining performance status.  Palliative gemcitabine was initiated on February 20, 2018.   ASSESSMENT: Stage IIIc high-grade serous ovarian carcinoma  PLAN:  1. Stage IIIc high-grade serous ovarian carcinoma: See oncology history as above.  PET scan results from May 07, 2018 reviewed independently with significant improvement in disease burden.  Despite this, CA-125 initially decreased to 44.2, but now has trended up slightly to 61.3.  Today's result is pending.  Patient has inquired about intraperitoneal chemotherapy, but the benefit is unclear with increased toxicity.  Proceed with cycle 4, day 8 of gemcitabine today.  Return to clinic in 1 week for further evaluation and consideration of cycle 4, day 15. 2. Pain: Well controlled.  Continue oxycodone as needed. 3. Anxiety: Chronic and unchanged.   Continue Xanax as prescribed. 4.  Peripheral neuropathy: Chronic and unchanged.  Continue gabapentin as prescribed.  Patient has agreed to acupuncture.   5.  Anemia: Hemoglobin has trended down slightly to 10.8. 6.  Shoulder pain: Patient does not complain of this today.  Continue cortisone injections as needed.  Patient is also indicated she may need rotator cuff surgery in the future.  Okay to proceed from an oncology standpoint, but will have to be coordinated with chemotherapy. 7.  Thrombocytopenia: Decreased to 81.  Proceed with treatment as above. 8.  Neutropenia: Resolved. 9.  Elevated liver enzymes: AST is only mildly elevated.  Monitor. 10.  Tooth pain: Continue follow-up with dentist as scheduled. 11.  Decreased performance status/anasarca: Resolved. 12.  Elevated creatinine, poor appetite: Resolved.  Patient expressed understanding and was in agreement with this plan. She also understands that She can call clinic at any time with any questions, concerns, or complaints.   Cancer Staging Ovarian cancer, unspecified laterality ([place]) Staging form: Ovary, Fallopian Tube, and Primary Peritoneal Carcinoma, AJCC 8th Edition - Clinical: No stage assigned - Unsigned   Lloyd Huger, MD   06/01/2018 2:15 PM

## 2018-06-01 NOTE — Progress Notes (Signed)
Patient denies any concerns today.  

## 2018-06-05 ENCOUNTER — Other Ambulatory Visit: Payer: Self-pay

## 2018-06-07 ENCOUNTER — Other Ambulatory Visit: Payer: Self-pay

## 2018-06-07 NOTE — Progress Notes (Signed)
[place]  [phone] [phone]  ID: Jessica Burgess OB: Jan 29, 1956  MR#: [id number]  YTK#:[id number]  Patient Care Team: Steele Sizer, MD as PCP - General (Family Medicine) Lloyd Huger, MD as Consulting Physician (Oncology) Mellody Drown, MD as Consulting Physician (Obstetrics and Gynecology) Cathi Roan, [facility] (Pharmacist) Benedetto Goad, RN as Case Manager Clent Jacks, RN as Registered Nurse  CHIEF COMPLAINT: Stage IIIc high-grade serous ovarian carcinoma  INTERVAL HISTORY: Patient returns to clinic today for further evaluation and consideration of cycle 4, day 15 of single agent gemcitabine.  She continues to feel well and remains asymptomatic. She denies any weakness or fatigue today.  She continues to have a mild peripheral neuropathy that is chronic and unchanged. She has no other neurologic complaints.  She denies any recent fevers or illnesses.  She has good appetite and denies weight loss.  She has no chest pain, cough, or shortness of breath. She denies any nausea, vomiting, constipation, or diarrhea.  She denies any abdominal pain or bloating.  She has no urinary complaints.  Patient offers no specific complaints today.  REVIEW OF SYSTEMS:   Review of Systems  Constitutional: Negative for fever, malaise/fatigue and weight loss.  HENT: Negative.  Negative for sore throat.   Respiratory: Negative.  Negative for cough and shortness of breath.   Cardiovascular: Negative.  Negative for chest pain and leg swelling.  Gastrointestinal: Negative.  Negative for abdominal pain, blood in stool, constipation, diarrhea, melena, nausea and vomiting.  Genitourinary: Negative.  Negative for dysuria.  Musculoskeletal: Positive for joint pain. Negative for back pain and neck pain.  Skin: Negative.  Negative for itching and rash.  Neurological: Positive for tingling and sensory change. Negative for focal weakness and weakness.   Psychiatric/Behavioral: Negative.  The patient is not nervous/anxious.     As per HPI. Otherwise, a complete review of systems is negative.  PAST MEDICAL HISTORY: Past Medical History:  Diagnosis Date  . Allergic rhinitis, cause unspecified   . Anxiety state, unspecified   . Arthritis   . Asthma    only when sick   . Backache, unspecified   . Bronchitis    hx of when get sick  . Cancer ([place])    skin cancer , basal cell   . Cancer ([place]) 11/2016   ovarian  . Cervicalgia   . Complication of anesthesia   . Dermatophytosis of nail   . Dysmetabolic syndrome X   . Encounter for long-term (current) use of other medications   . Esophageal reflux   . Insomnia, unspecified   . Leukocytosis, unspecified   . Migraine without aura, without mention of intractable migraine without mention of status migrainosus   . Other and unspecified hyperlipidemia   . Other malaise and fatigue   . Overweight(278.02)   . Personal history of chemotherapy now   ovarian  . PONV (postoperative nausea and vomiting)   . Spinal stenosis in cervical region   . Symptomatic menopausal or female climacteric states   . Unspecified disorder of skin and subcutaneous tissue   . Unspecified vitamin D deficiency     PAST SURGICAL HISTORY: Past Surgical History:  Procedure Laterality Date  . ABDOMINAL HYSTERECTOMY    . ANTERIOR CERVICAL DECOMP/DISCECTOMY FUSION N/A 06/07/2015   Procedure: Cervical three - four and Cervical six- seven anterior cervical decompression with fusion interbody prosthesis plating and bonegraft;  Surgeon: Newman Pies, MD;  Location: [facility];  Service: Neurosurgery;  Laterality:  N/A;  C34 and C67 anterior cervical decompression with fusion interbody prosthesis plating and bonegraft  . BACK SURGERY     x2 Lower   . EVACUATION OF CERVICAL HEMATOMA N/A 06/14/2015   Procedure: EVACUATION OF CERVICAL HEMATOMA;  Surgeon: Newman Pies, MD;  Location: [facility];  Service: Neurosurgery;   Laterality: N/A;  . NECK SURGERY     x3  . TONSILLECTOMY      FAMILY HISTORY: Family History  Problem Relation Age of Onset  . Depression Mother   . Migraines Mother   . Dementia Father   . Diabetes Father   . Hyperlipidemia Father   . Hyperlipidemia Brother   . Hyperlipidemia Brother   . Breast cancer Paternal Aunt        18s    ADVANCED DIRECTIVES (Y/N):  N  HEALTH MAINTENANCE: Social History   Tobacco Use  . Smoking status: Former Smoker    Packs/day: 1.50    Years: 20.00    Pack years: 30.00    Types: Cigarettes    Start date: 03/19/1979    Last attempt to quit: 08/25/1999    Years since quitting: 18.8  . Smokeless tobacco: Never Used  . Tobacco comment: smoking cessation materials not required  Substance Use Topics  . Alcohol use: Not Currently    Alcohol/week: 0.0 standard drinks  . Drug use: No     Colonoscopy:  PAP:  Bone density:  Lipid panel:  No Known Allergies  Current Outpatient Medications  Medication Sig Dispense Refill  . albuterol (PROVENTIL HFA;VENTOLIN HFA) 108 (90 Base) MCG/ACT inhaler Inhale 2 puffs into the lungs every 6 (six) hours as needed for wheezing or shortness of breath. 1 Inhaler 2  . albuterol (PROVENTIL) (2.5 MG/3ML) 0.083% nebulizer solution Take 3 mLs (2.5 mg total) by nebulization every 6 (six) hours as needed for wheezing or shortness of breath. 75 mL 2  . Ascorbic Acid (VITAMIN C PO) Take 1 tablet by mouth daily.     . budesonide-formoterol (SYMBICORT) 160-4.5 MCG/ACT inhaler Inhale 2 puffs into the lungs 2 (two) times daily.    . Cholecalciferol (VITAMIN D-1000 MAX ST) 1000 UNITS tablet Take 1 tablet by mouth 2 (two) times daily.    . Cyanocobalamin (B-12 PO) Take 1 tablet by mouth daily.     . cyclobenzaprine (FLEXERIL) 10 MG tablet Take one tablet every 8 hours as needed 30 tablet 1  . DULoxetine (CYMBALTA) 60 MG capsule TAKE 1 CAPSULE(60 MG) BY MOUTH DAILY 180 capsule 1  . estradiol (ESTRACE) 0.5 MG tablet Take 1 tablet  (0.5 mg total) by mouth daily. 90 tablet 4  . ferrous sulfate (IRON SUPPLEMENT) 325 (65 FE) MG tablet Take 1 tablet by mouth daily.    . fluticasone (FLONASE) 50 MCG/ACT nasal spray Place 2 sprays into both nostrils as needed. 48 g 1  . gabapentin (NEURONTIN) 300 MG capsule Take 1-3 capsules (300-900 mg total) by mouth 3 (three) times daily. 300 mg twice a day and 900 mg at night 540 capsule 1  . ipratropium (ATROVENT HFA) 17 MCG/ACT inhaler Inhale 2 puffs into the lungs every 6 (six) hours as needed.     . lidocaine (XYLOCAINE) 2 % solution Use as directed 15 mLs in the mouth or throat as needed for mouth pain. 100 mL 0  . Lysine 1000 MG TABS Take 1 tablet by mouth daily.     . magic mouthwash w/lidocaine SOLN Take 5 mLs by mouth 3 (three) times daily. [place]  mL 0  . Magnesium Oxide 500 MG CAPS Take 500 mg by mouth 2 (two) times daily.     . Multiple Vitamins-Minerals (MULTIVITAMIN PO) Take 1 tablet by mouth daily.     . Omega-3 Fatty Acids (FISH OIL PO) Take 1 capsule by mouth 2 (two) times daily.     Marland Kitchen oxyCODONE-acetaminophen (PERCOCET) 10-325 MG tablet Take 1 tablet by mouth every 6 (six) hours as needed for pain. 120 tablet 0  . potassium gluconate 595 MG TABS tablet Take 595 mg by mouth daily.    . SUMAtriptan (IMITREX) 100 MG tablet May repeat in 2 hours if headache persists or recurs. 27 tablet 0  . temazepam (RESTORIL) 15 MG capsule Take 1 capsule (15 mg total) by mouth at bedtime as needed for sleep. 90 capsule 0  . triamcinolone (KENALOG) 0.1 % paste Use as directed 1 application in the mouth or throat 2 (two) times daily. 5 g 2  . urea (CARMOL) 10 % cream Apply topically as needed. 410 g 1  . valACYclovir (VALTREX) 1000 MG tablet TAKE 1 TABLET(1000 MG) BY MOUTH THREE TIMES DAILY FOR 7 DAYS 21 tablet 0   No current facility-administered medications for this visit.    Facility-Administered Medications Ordered in Other Visits  Medication Dose Route Frequency Provider Last Rate Last Dose   . 0.9 %  sodium chloride infusion   Intravenous Once Lloyd Huger, MD      . 0.9 %  sodium chloride infusion   Intravenous Once Lloyd Huger, MD      . dexamethasone (DECADRON) 20 mg in sodium chloride 0.9 % 50 mL IVPB  20 mg Intravenous Once Lloyd Huger, MD      . dexamethasone (DECADRON) injection 10 mg  10 mg Intravenous Once Lloyd Huger, MD      . sodium chloride flush (NS) 0.9 % injection 10 mL  10 mL Intravenous PRN Lloyd Huger, MD   10 mL at 06/16/17 0854    OBJECTIVE: Vitals:   06/08/18 0948  BP: (!) 150/88  Pulse: 87  Temp: (!) 96.1 F (35.6 C)     Body mass index is 29.28 kg/m.    ECOG FS:0 - Asymptomatic  General: Well-developed, well-nourished, no acute distress. Eyes: Pink conjunctiva, anicteric sclera. HEENT: Normocephalic, moist mucous membranes. Lungs: Clear to auscultation bilaterally. Heart: Regular rate and rhythm. No rubs, murmurs, or gallops. Abdomen: Soft, nontender, nondistended. No organomegaly noted, normoactive bowel sounds. Musculoskeletal: No edema, cyanosis, or clubbing. Neuro: Alert, answering all questions appropriately. Cranial nerves grossly intact. Skin: No rashes or petechiae noted. Psych: Normal affect.  LAB RESULTS:  Lab Results  Component Value Date   NA 138 06/08/2018   K 4.0 06/08/2018   CL 103 06/08/2018   CO2 25 06/08/2018   GLUCOSE 119 (H) 06/08/2018   BUN 35 (H) 06/08/2018   CREATININE 1.33 (H) 06/08/2018   CALCIUM 9.0 06/08/2018   PROT 7.2 06/08/2018   ALBUMIN 3.8 06/08/2018   AST 45 (H) 06/08/2018   ALT 43 06/08/2018   ALKPHOS 69 06/08/2018   BILITOT 0.6 06/08/2018   GFRNONAA 42 (L) 06/08/2018   GFRAA 49 (L) 06/08/2018    Lab Results  Component Value Date   WBC 3.5 (L) 06/08/2018   NEUTROABS 1.2 (L) 06/08/2018   HGB 10.3 (L) 06/08/2018   HCT 29.6 (L) 06/08/2018   MCV 102.1 (H) 06/08/2018   PLT 76 (L) 06/08/2018     STUDIES: No results found.  ONCOLOGY  HISTORY: Patient  underwent debulking surgery at [facility] on November 19, 2016. She received one infusion preoperative infusion of Keytruda on November 11, 2016 as part of a clinical trial.  She initiated adjuvant carboplatinum and Taxol on December 16, 2016.  This was continued through April 28, 2017 at which time Taxol was discontinued secondary to worsening neuropathy.  Patient then received single agent carboplatinum for additional 2 infusions.  She was then noted to have progression of disease and was switched to Avastin and doxorubicin on August 18, 2017 this was discontinued on November 24, 2017 secondary to declining performance status.  Palliative gemcitabine was initiated on February 20, 2018.   ASSESSMENT: Stage IIIc high-grade serous ovarian carcinoma  PLAN:  1. Stage IIIc high-grade serous ovarian carcinoma: See oncology history as above.  PET scan results from May 07, 2018 reviewed independently with significant improvement in disease burden.  Despite this, CA-125 initially decreased to 44.2, but now has trended up slightly to 61.3. Patient has inquired about intraperitoneal chemotherapy, but the benefit is unclear with increased toxicity.  Proceed with cycle 4, day 15 of gemcitabine today.  Return to clinic in 2 weeks for further evaluation and consideration of cycle 5, day 1.   2. Pain: Well controlled.  Continue oxycodone as needed. 3. Anxiety: Chronic and unchanged.  Continue Xanax as prescribed. 4.  Peripheral neuropathy: Chronic and unchanged.  Continue gabapentin as prescribed.  Patient has agreed to acupuncture.   5.  Anemia: Patient's hemoglobin has trended down to 10.3, monitor. 6.  Shoulder pain: Patient does not complain of this today.  Continue cortisone injections as needed.  Patient is also indicated she may need rotator cuff surgery in the future.  Okay to proceed from an oncology standpoint, but will have to be coordinated with chemotherapy. 7.  Thrombocytopenia: Decreased but stable  at 76, proceed with treatment as above. 8.  Neutropenia: Mild, proceed with treatment. 9.  Elevated liver enzymes: AST remains mildly elevated at 45. 10.  Tooth pain: Continue follow-up with dentist as scheduled. 11.  Decreased performance status/anasarca: Resolved.   Patient expressed understanding and was in agreement with this plan. She also understands that She can call clinic at any time with any questions, concerns, or complaints.   Cancer Staging Ovarian cancer, unspecified laterality ([place]) Staging form: Ovary, Fallopian Tube, and Primary Peritoneal Carcinoma, AJCC 8th Edition - Clinical: No stage assigned - Unsigned   Lloyd Huger, MD   06/09/2018 6:26 AM

## 2018-06-08 ENCOUNTER — Inpatient Hospital Stay: Payer: PPO

## 2018-06-08 ENCOUNTER — Inpatient Hospital Stay (HOSPITAL_BASED_OUTPATIENT_CLINIC_OR_DEPARTMENT_OTHER): Payer: PPO | Admitting: Oncology

## 2018-06-08 ENCOUNTER — Other Ambulatory Visit: Payer: Self-pay

## 2018-06-08 VITALS — BP 150/88 | HR 87 | Temp 96.1°F | Wt 160.1 lb

## 2018-06-08 DIAGNOSIS — D709 Neutropenia, unspecified: Secondary | ICD-10-CM

## 2018-06-08 DIAGNOSIS — D696 Thrombocytopenia, unspecified: Secondary | ICD-10-CM

## 2018-06-08 DIAGNOSIS — Z87891 Personal history of nicotine dependence: Secondary | ICD-10-CM

## 2018-06-08 DIAGNOSIS — C569 Malignant neoplasm of unspecified ovary: Secondary | ICD-10-CM

## 2018-06-08 DIAGNOSIS — G629 Polyneuropathy, unspecified: Secondary | ICD-10-CM | POA: Diagnosis not present

## 2018-06-08 DIAGNOSIS — R5383 Other fatigue: Secondary | ICD-10-CM | POA: Diagnosis not present

## 2018-06-08 DIAGNOSIS — D649 Anemia, unspecified: Secondary | ICD-10-CM | POA: Diagnosis not present

## 2018-06-08 DIAGNOSIS — Z5111 Encounter for antineoplastic chemotherapy: Secondary | ICD-10-CM | POA: Diagnosis not present

## 2018-06-08 LAB — CBC WITH DIFFERENTIAL/PLATELET
Abs Immature Granulocytes: 0.09 10*3/uL — ABNORMAL HIGH (ref 0.00–0.07)
Basophils Absolute: 0 10*3/uL (ref 0.0–0.1)
Basophils Relative: 1 %
Eosinophils Absolute: 0 10*3/uL (ref 0.0–0.5)
Eosinophils Relative: 1 %
HCT: 29.6 % — ABNORMAL LOW (ref 36.0–46.0)
Hemoglobin: 10.3 g/dL — ABNORMAL LOW (ref 12.0–15.0)
IMMATURE GRANULOCYTES: 3 %
Lymphocytes Relative: 51 %
Lymphs Abs: 1.8 10*3/uL (ref 0.7–4.0)
MCH: 35.5 pg — ABNORMAL HIGH (ref 26.0–34.0)
MCHC: 34.8 g/dL (ref 30.0–36.0)
MCV: 102.1 fL — ABNORMAL HIGH (ref 80.0–100.0)
Monocytes Absolute: 0.3 10*3/uL (ref 0.1–1.0)
Monocytes Relative: 9 %
NEUTROS ABS: 1.2 10*3/uL — AB (ref 1.7–7.7)
NEUTROS PCT: 35 %
Platelets: 76 10*3/uL — ABNORMAL LOW (ref 150–400)
RBC: 2.9 MIL/uL — ABNORMAL LOW (ref 3.87–5.11)
RDW: 13.6 % (ref 11.5–15.5)
WBC: 3.5 10*3/uL — ABNORMAL LOW (ref 4.0–10.5)
nRBC: 0.6 % — ABNORMAL HIGH (ref 0.0–0.2)

## 2018-06-08 LAB — COMPREHENSIVE METABOLIC PANEL
ALT: 43 U/L (ref 0–44)
ANION GAP: 10 (ref 5–15)
AST: 45 U/L — ABNORMAL HIGH (ref 15–41)
Albumin: 3.8 g/dL (ref 3.5–5.0)
Alkaline Phosphatase: 69 U/L (ref 38–126)
BUN: 35 mg/dL — ABNORMAL HIGH (ref 8–23)
CO2: 25 mmol/L (ref 22–32)
Calcium: 9 mg/dL (ref 8.9–10.3)
Chloride: 103 mmol/L (ref 98–111)
Creatinine, Ser: 1.33 mg/dL — ABNORMAL HIGH (ref 0.44–1.00)
GFR calc Af Amer: 49 mL/min — ABNORMAL LOW (ref >60)
GFR calc non Af Amer: 42 mL/min — ABNORMAL LOW (ref >60)
Glucose, Bld: 119 mg/dL — ABNORMAL HIGH (ref 70–99)
Potassium: 4 mmol/L (ref 3.5–5.1)
Sodium: 138 mmol/L (ref 135–145)
Total Bilirubin: 0.6 mg/dL (ref 0.3–1.2)
Total Protein: 7.2 g/dL (ref 6.5–8.1)

## 2018-06-08 MED ORDER — PROCHLORPERAZINE MALEATE 10 MG PO TABS
10.0000 mg | ORAL_TABLET | Freq: Once | ORAL | Status: AC
  Administered 2018-06-08: 10 mg via ORAL
  Filled 2018-06-08: qty 1

## 2018-06-08 MED ORDER — SODIUM CHLORIDE 0.9 % IV SOLN
1600.0000 mg | Freq: Once | INTRAVENOUS | Status: AC
  Administered 2018-06-08: 1600 mg via INTRAVENOUS
  Filled 2018-06-08: qty 26.3

## 2018-06-08 MED ORDER — HEPARIN SOD (PORK) LOCK FLUSH 100 UNIT/ML IV SOLN
500.0000 [IU] | Freq: Once | INTRAVENOUS | Status: DC
  Filled 2018-06-08: qty 5

## 2018-06-08 MED ORDER — SODIUM CHLORIDE 0.9 % IV SOLN
Freq: Once | INTRAVENOUS | Status: AC
  Administered 2018-06-08: 11:00:00 via INTRAVENOUS
  Filled 2018-06-08: qty 250

## 2018-06-08 MED ORDER — SODIUM CHLORIDE 0.9% FLUSH
10.0000 mL | Freq: Once | INTRAVENOUS | Status: AC
  Administered 2018-06-08: 10 mL via INTRAVENOUS
  Filled 2018-06-08: qty 10

## 2018-06-08 MED ORDER — HEPARIN SOD (PORK) LOCK FLUSH 100 UNIT/ML IV SOLN
500.0000 [IU] | Freq: Once | INTRAVENOUS | Status: AC | PRN
  Administered 2018-06-08: 500 [IU]

## 2018-06-08 NOTE — Progress Notes (Signed)
Patient has chronic back pain since 2001.  The pain is progressing and radiating down left leg, 4/10 this morning.

## 2018-06-10 ENCOUNTER — Other Ambulatory Visit: Payer: Self-pay

## 2018-06-10 ENCOUNTER — Telehealth (INDEPENDENT_AMBULATORY_CARE_PROVIDER_SITE_OTHER): Payer: PPO | Admitting: Family Medicine

## 2018-06-10 ENCOUNTER — Ambulatory Visit: Payer: PPO | Admitting: Family Medicine

## 2018-06-10 DIAGNOSIS — M5416 Radiculopathy, lumbar region: Secondary | ICD-10-CM | POA: Diagnosis not present

## 2018-06-10 DIAGNOSIS — G43009 Migraine without aura, not intractable, without status migrainosus: Secondary | ICD-10-CM

## 2018-06-10 DIAGNOSIS — G4700 Insomnia, unspecified: Secondary | ICD-10-CM

## 2018-06-10 MED ORDER — TEMAZEPAM 15 MG PO CAPS: 15.0000 mg | ORAL_CAPSULE | Freq: Every evening | ORAL | 0 refills | Status: DC | PRN

## 2018-06-10 MED ORDER — SUMATRIPTAN SUCCINATE 100 MG PO TABS: ORAL_TABLET | ORAL | 0 refills | Status: DC
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
## 2018-06-11 ENCOUNTER — Other Ambulatory Visit: Payer: Self-pay | Admitting: Family Medicine

## 2018-06-11 DIAGNOSIS — G8929 Other chronic pain: Secondary | ICD-10-CM

## 2018-06-11 DIAGNOSIS — G43009 Migraine without aura, not intractable, without status migrainosus: Secondary | ICD-10-CM

## 2018-06-11 DIAGNOSIS — M542 Cervicalgia: Secondary | ICD-10-CM

## 2018-06-11 DIAGNOSIS — M5441 Lumbago with sciatica, right side: Secondary | ICD-10-CM

## 2018-06-11 DIAGNOSIS — J3089 Other allergic rhinitis: Secondary | ICD-10-CM

## 2018-06-11 DIAGNOSIS — E2839 Other primary ovarian failure: Secondary | ICD-10-CM

## 2018-06-11 DIAGNOSIS — F329 Major depressive disorder, single episode, unspecified: Secondary | ICD-10-CM

## 2018-06-11 DIAGNOSIS — G620 Drug-induced polyneuropathy: Secondary | ICD-10-CM

## 2018-06-11 DIAGNOSIS — T451X5A Adverse effect of antineoplastic and immunosuppressive drugs, initial encounter: Secondary | ICD-10-CM

## 2018-06-11 MED ORDER — GABAPENTIN 300 MG PO CAPS: 300.0000 mg | ORAL_CAPSULE | Freq: Three times a day (TID) | ORAL | 1 refills | Status: DC

## 2018-06-11 MED ORDER — FLUTICASONE PROPIONATE 50 MCG/ACT NA SUSP: 2.0000 | NASAL | 1 refills | Status: AC | PRN

## 2018-06-11 MED ORDER — DULOXETINE HCL 60 MG PO CPEP: ORAL_CAPSULE | ORAL | 1 refills | Status: DC

## 2018-06-11 NOTE — Telephone Encounter (Signed)
Copied from [place] [id number]. Topic: Quick Communication - Rx Refill/Question >> Jun 11, 2018  8:48 AM Virl Axe D wrote: Medication: gabapentin (NEURONTIN) 300 MG capsule / estradiol (ESTRACE) 0.5 MG tablet / DULoxetine (CYMBALTA) 60 MG capsule / fluticasone (FLONASE) 50 MCG/ACT nasal spray /90 day supplies  Has the patient contacted their pharmacy? Yes.   (Agent: If no, request that the patient contact the pharmacy for the refill.) (Agent: If yes, when and what did the pharmacy advise?)  Preferred Pharmacy (with phone number or street name): [facility]) - [place], [place] [phone] (Phone) [phone] (Fax)  Agent: Please be advised that RX refills may take up to 3 business days. We ask that you follow-up with your pharmacy.

## 2018-06-12 NOTE — Telephone Encounter (Signed)
Left message on patient voicemail that Dr. Ancil Boozer sent in patient Duloxetine and Flonase however she would have to request Estradiol from [facility] and Gabapentin from Dr. Phyllis Ginger.

## 2018-06-17 ENCOUNTER — Telehealth: Payer: Self-pay | Admitting: Family Medicine

## 2018-06-17 NOTE — Telephone Encounter (Signed)
Jarrett Soho from [facility] left a vm about pt's gabapentin 300mg . She states the rx has three different sets of directions and they would like to know which one is the correct one and also she was concerned about the qty being 540 with #1 rf. She states the qty has to be #90 due to this being a mail order.  She left a call back number [phone], you can leave a detail message but make sure to leave first and last name of the person calling

## 2018-06-17 NOTE — Telephone Encounter (Signed)
Left voicemail on [facility] regarding Jessica Burgess Gabapentin prescription and diagnosis.

## 2018-06-19 ENCOUNTER — Other Ambulatory Visit: Payer: Self-pay | Admitting: *Deleted

## 2018-06-19 DIAGNOSIS — M5441 Lumbago with sciatica, right side: Principal | ICD-10-CM

## 2018-06-19 DIAGNOSIS — G8929 Other chronic pain: Secondary | ICD-10-CM

## 2018-06-19 DIAGNOSIS — M542 Cervicalgia: Secondary | ICD-10-CM

## 2018-06-19 DIAGNOSIS — C569 Malignant neoplasm of unspecified ovary: Secondary | ICD-10-CM

## 2018-06-19 MED ORDER — OXYCODONE-ACETAMINOPHEN 10-325 MG PO TABS: 1.0000 | ORAL_TABLET | Freq: Four times a day (QID) | ORAL | 0 refills | Status: DC | PRN

## 2018-06-21 NOTE — Progress Notes (Signed)
Jessica Burgess  [phone] [phone]  ID: Jessica Burgess OB: 02-04-1956  MR#: [id number]  CXK#:[id number]  Patient Care Team: Steele Sizer, MD as PCP - General (Family Medicine) Lloyd Huger, MD as Consulting Physician (Oncology) Mellody Drown, MD as Consulting Physician (Obstetrics and Gynecology) Cathi Roan, [facility] (Pharmacist) Benedetto Goad, RN as Case Manager Clent Jacks, RN as Registered Nurse  CHIEF COMPLAINT: Stage IIIc high-grade serous ovarian carcinoma  INTERVAL HISTORY: Patient returns to clinic today for further evaluation and consideration of cycle 5, day 1 of single agent gemcitabine.  She continues to have mild blurry vision, but otherwise feels well.  She denies any weakness or fatigue today.  She continues to have a mild peripheral neuropathy that is chronic and unchanged. She has no other neurologic complaints.  She denies any recent fevers or illnesses.  She has good appetite and denies weight loss.  She has no chest pain, cough, or shortness of breath. She denies any nausea, vomiting, constipation, or diarrhea.  She denies any abdominal pain or bloating.  She has no urinary complaints.  Patient offers no further specific complaints today.  REVIEW OF SYSTEMS:   Review of Systems  Constitutional: Negative for fever, malaise/fatigue and weight loss.  HENT: Negative.  Negative for sore throat.   Eyes: Positive for blurred vision.  Respiratory: Negative.  Negative for cough and shortness of breath.   Cardiovascular: Negative.  Negative for chest pain and leg swelling.  Gastrointestinal: Negative.  Negative for abdominal pain, blood in stool, constipation, diarrhea, melena, nausea and vomiting.  Genitourinary: Negative.  Negative for dysuria.  Musculoskeletal: Positive for joint pain. Negative for back pain and neck pain.  Skin: Negative.  Negative for itching and rash.  Neurological: Positive for tingling and  sensory change. Negative for focal weakness and weakness.  Psychiatric/Behavioral: Negative.  The patient is not nervous/anxious.     As per HPI. Otherwise, a complete review of systems is negative.  PAST MEDICAL HISTORY: Past Medical History:  Diagnosis Date  . Allergic rhinitis, cause unspecified   . Anxiety state, unspecified   . Arthritis   . Asthma    only when sick   . Backache, unspecified   . Bronchitis    hx of when get sick  . Cancer ([place])    skin cancer , basal cell   . Cancer ([place]) 11/2016   ovarian  . Cervicalgia   . Complication of anesthesia   . Dermatophytosis of nail   . Dysmetabolic syndrome X   . Encounter for long-term (current) use of other medications   . Esophageal reflux   . Insomnia, unspecified   . Leukocytosis, unspecified   . Migraine without aura, without mention of intractable migraine without mention of status migrainosus   . Other and unspecified hyperlipidemia   . Other malaise and fatigue   . Overweight(278.02)   . Personal history of chemotherapy now   ovarian  . PONV (postoperative nausea and vomiting)   . Spinal stenosis in cervical region   . Symptomatic menopausal or female climacteric states   . Unspecified disorder of skin and subcutaneous tissue   . Unspecified vitamin D deficiency     PAST SURGICAL HISTORY: Past Surgical History:  Procedure Laterality Date  . ABDOMINAL HYSTERECTOMY    . ANTERIOR CERVICAL DECOMP/DISCECTOMY FUSION N/A 06/07/2015   Procedure: Cervical three - four and Cervical six- seven anterior cervical decompression with fusion interbody prosthesis plating and bonegraft;  Surgeon: Newman Pies,  MD;  Location: [place] [facility];  Service: Neurosurgery;  Laterality: N/A;  C34 and C67 anterior cervical decompression with fusion interbody prosthesis plating and bonegraft  . BACK SURGERY     x2 Lower   . EVACUATION OF CERVICAL HEMATOMA N/A 06/14/2015   Procedure: EVACUATION OF CERVICAL HEMATOMA;  Surgeon: Newman Pies, MD;  Location: [place] [facility];  Service: Neurosurgery;  Laterality: N/A;  . NECK SURGERY     x3  . TONSILLECTOMY      FAMILY HISTORY: Family History  Problem Relation Age of Onset  . Depression Mother   . Migraines Mother   . Dementia Father   . Diabetes Father   . Hyperlipidemia Father   . Hyperlipidemia Brother   . Hyperlipidemia Brother   . Breast cancer Paternal Aunt        7s    ADVANCED DIRECTIVES (Y/N):  N  HEALTH MAINTENANCE: Social History   Tobacco Use  . Smoking status: Former Smoker    Packs/day: 1.50    Years: 20.00    Pack years: 30.00    Types: Cigarettes    Start date: 03/19/1979    Last attempt to quit: 08/25/1999    Years since quitting: 18.8  . Smokeless tobacco: Never Used  . Tobacco comment: smoking cessation materials not required  Substance Use Topics  . Alcohol use: Not Currently    Alcohol/week: 0.0 standard drinks  . Drug use: No     Colonoscopy:  PAP:  Bone density:  Lipid panel:  No Known Allergies  Current Outpatient Medications  Medication Sig Dispense Refill  . albuterol (PROVENTIL HFA;VENTOLIN HFA) 108 (90 Base) MCG/ACT inhaler Inhale 2 puffs into the lungs every 6 (six) hours as needed for wheezing or shortness of breath. 1 Inhaler 2  . albuterol (PROVENTIL) (2.5 MG/3ML) 0.083% nebulizer solution Take 3 mLs (2.5 mg total) by nebulization every 6 (six) hours as needed for wheezing or shortness of breath. 75 mL 2  . Ascorbic Acid (VITAMIN C PO) Take 1 tablet by mouth daily.     . budesonide-formoterol (SYMBICORT) 160-4.5 MCG/ACT inhaler Inhale 2 puffs into the lungs 2 (two) times daily.    . Cholecalciferol (VITAMIN D-1000 MAX ST) 1000 UNITS tablet Take 1 tablet by mouth 2 (two) times daily.    . Cyanocobalamin (B-12 PO) Take 1 tablet by mouth daily.     . cyclobenzaprine (FLEXERIL) 10 MG tablet Take one tablet every 8 hours as needed 30 tablet 1  . DULoxetine (CYMBALTA) 60 MG capsule TAKE 1 CAPSULE(60 MG) BY MOUTH DAILY 180  capsule 1  . estradiol (ESTRACE) 0.5 MG tablet Take 1 tablet (0.5 mg total) by mouth daily. 90 tablet 4  . ferrous sulfate (IRON SUPPLEMENT) 325 (65 FE) MG tablet Take 1 tablet by mouth daily.    . fluticasone (FLONASE) 50 MCG/ACT nasal spray Place 2 sprays into both nostrils as needed. 48 g 1  . gabapentin (NEURONTIN) 300 MG capsule Take 1-3 capsules (300-900 mg total) by mouth 3 (three) times daily. 300 mg twice a day and 900 mg at night 540 capsule 1  . ipratropium (ATROVENT HFA) 17 MCG/ACT inhaler Inhale 2 puffs into the lungs every 6 (six) hours as needed.     . lidocaine (XYLOCAINE) 2 % solution Use as directed 15 mLs in the mouth or throat as needed for mouth pain. 100 mL 0  . Lysine 1000 MG TABS Take 1 tablet by mouth daily.     . magic mouthwash w/lidocaine  SOLN Take 5 mLs by mouth 3 (three) times daily. 315 mL 0  . Magnesium Oxide 500 MG CAPS Take 500 mg by mouth 2 (two) times daily.     . Multiple Vitamins-Minerals (MULTIVITAMIN PO) Take 1 tablet by mouth daily.     . Omega-3 Fatty Acids (FISH OIL PO) Take 1 capsule by mouth 2 (two) times daily.     . ondansetron (ZOFRAN-ODT) 8 MG disintegrating tablet Take 1 tablet (8 mg total) by mouth every 8 (eight) hours as needed for nausea or vomiting. 60 tablet 0  . oxyCODONE-acetaminophen (PERCOCET) 10-325 MG tablet Take 1 tablet by mouth every 6 (six) hours as needed for pain. 120 tablet 0  . potassium gluconate 595 MG TABS tablet Take 595 mg by mouth daily.    . SUMAtriptan (IMITREX) 100 MG tablet May repeat in 2 hours if headache persists or recurs. 9 tablet 0  . temazepam (RESTORIL) 15 MG capsule Take 1 capsule (15 mg total) by mouth at bedtime as needed for sleep. 30 capsule 0  . triamcinolone (KENALOG) 0.1 % paste Use as directed 1 application in the mouth or throat 2 (two) times daily. 5 g 2  . urea (CARMOL) 10 % cream Apply topically as needed. 410 g 1  . valACYclovir (VALTREX) 1000 MG tablet TAKE 1 TABLET(1000 MG) BY MOUTH THREE TIMES  DAILY FOR 7 DAYS 21 tablet 0   No current facility-administered medications for this visit.    Facility-Administered Medications Ordered in Other Visits  Medication Dose Route Frequency Provider Last Rate Last Dose  . 0.9 %  sodium chloride infusion   Intravenous Once Lloyd Huger, MD      . 0.9 %  sodium chloride infusion   Intravenous Once Lloyd Huger, MD      . dexamethasone (DECADRON) 20 mg in sodium chloride 0.9 % 50 mL IVPB  20 mg Intravenous Once Lloyd Huger, MD      . dexamethasone (DECADRON) injection 10 mg  10 mg Intravenous Once Lloyd Huger, MD      . sodium chloride flush (NS) 0.9 % injection 10 mL  10 mL Intravenous PRN Lloyd Huger, MD   10 mL at 06/16/17 0854    OBJECTIVE: Vitals:   06/22/18 0916  BP: (!) 161/91  Pulse: 92  Temp: 98.4 F (36.9 C)     Body mass index is 30.18 kg/m.    ECOG FS:0 - Asymptomatic  General: Well-developed, well-nourished, no acute distress. Eyes: Pink conjunctiva, anicteric sclera. HEENT: Normocephalic, moist mucous membranes. Lungs: Clear to auscultation bilaterally. Heart: Regular rate and rhythm. No rubs, murmurs, or gallops. Abdomen: Soft, nontender, nondistended. No organomegaly noted, normoactive bowel sounds. Musculoskeletal: No edema, cyanosis, or clubbing. Neuro: Alert, answering all questions appropriately. Cranial nerves grossly intact. Skin: No rashes or petechiae noted. Psych: Normal affect.  LAB RESULTS:  Lab Results  Component Value Date   NA 138 06/22/2018   K 3.7 06/22/2018   CL 100 06/22/2018   CO2 25 06/22/2018   GLUCOSE 114 (H) 06/22/2018   BUN 33 (H) 06/22/2018   CREATININE 1.95 (H) 06/22/2018   CALCIUM 8.7 (L) 06/22/2018   PROT 6.8 06/22/2018   ALBUMIN 3.8 06/22/2018   AST 90 (H) 06/22/2018   ALT 57 (H) 06/22/2018   ALKPHOS 75 06/22/2018   BILITOT 0.5 06/22/2018   GFRNONAA 27 (L) 06/22/2018   GFRAA 31 (L) 06/22/2018    Lab Results  Component Value Date   WBC  5.8  06/22/2018   NEUTROABS 3.2 06/22/2018   HGB 8.6 (L) 06/22/2018   HCT 25.5 (L) 06/22/2018   MCV 109.4 (H) 06/22/2018   PLT 251 06/22/2018     STUDIES: No results found.  ONCOLOGY HISTORY: Patient underwent debulking surgery at [facility] on November 19, 2016. She received one infusion preoperative infusion of Keytruda on November 11, 2016 as part of a clinical trial.  She initiated adjuvant carboplatinum and Taxol on December 16, 2016.  This was continued through April 28, 2017 at which time Taxol was discontinued secondary to worsening neuropathy.  Patient then received single agent carboplatinum for additional 2 infusions.  She was then noted to have progression of disease and was switched to Avastin and doxorubicin on August 18, 2017 this was discontinued on November 24, 2017 secondary to declining performance status.  Palliative gemcitabine was initiated on February 20, 2018.   ASSESSMENT: Stage IIIc high-grade serous ovarian carcinoma  PLAN:  1. Stage IIIc high-grade serous ovarian carcinoma: See oncology history as above.  PET scan results from May 07, 2018 reviewed independently with significant improvement in disease burden.  Despite this, CA-125 initially decreased to 44.2, but now has trended up slightly to 61.3 today's result is pending. Patient has inquired about intraperitoneal chemotherapy, but the benefit is unclear with increased toxicity.  Proceed with cycle 5, day 1 of gemcitabine today.  Return to clinic in 1 week for further evaluation and consideration of cycle 5, day 8.     2. Pain: Well controlled.  Continue oxycodone as needed. 3. Anxiety: Chronic and unchanged.  Continue Xanax as prescribed. 4.  Peripheral neuropathy: Chronic and unchanged.  Continue gabapentin as prescribed.  Patient has agreed to acupuncture.   5.  Anemia: Patient's hemoglobin continues to trend down and is now 8.6, monitor. 6.  Shoulder pain: Patient does not complain of this today.   Continue cortisone injections as needed.  Patient is also indicated she may need rotator cuff surgery in the future.  Okay to proceed from an oncology standpoint, but will have to be coordinated with chemotherapy. 7.  Thrombocytopenia: Resolved. 8.  Neutropenia: Resolved. 9.  Elevated liver enzymes: AST and ALT mildly elevated today.  Proceed with treatment as above. 10.  Tooth pain: Continue follow-up with dentist as scheduled. 11.  Decreased performance status/anasarca: Resolved. 12.  Renal insufficiency: Patient's creatinine has trended up to 1.95.  Continue to monitor and consider reimaging if creatinine gets worse.   Patient expressed understanding and was in agreement with this plan. She also understands that She can call clinic at any time with any questions, concerns, or complaints.   Cancer Staging Ovarian cancer, unspecified laterality ([place]) Staging form: Ovary, Fallopian Tube, and Primary Peritoneal Carcinoma, AJCC 8th Edition - Clinical: No stage assigned - Unsigned   Lloyd Huger, MD   06/22/2018 3:47 PM

## 2018-06-22 ENCOUNTER — Inpatient Hospital Stay (HOSPITAL_BASED_OUTPATIENT_CLINIC_OR_DEPARTMENT_OTHER): Payer: PPO | Admitting: Oncology

## 2018-06-22 ENCOUNTER — Encounter: Payer: Self-pay | Admitting: Oncology

## 2018-06-22 ENCOUNTER — Other Ambulatory Visit: Payer: Self-pay

## 2018-06-22 ENCOUNTER — Inpatient Hospital Stay: Payer: PPO | Attending: Oncology

## 2018-06-22 ENCOUNTER — Inpatient Hospital Stay: Payer: PPO

## 2018-06-22 VITALS — BP 161/91 | HR 92 | Temp 98.4°F | Ht 62.0 in | Wt 165.0 lb

## 2018-06-22 VITALS — Resp 17

## 2018-06-22 DIAGNOSIS — G629 Polyneuropathy, unspecified: Secondary | ICD-10-CM | POA: Diagnosis not present

## 2018-06-22 DIAGNOSIS — R0602 Shortness of breath: Secondary | ICD-10-CM | POA: Insufficient documentation

## 2018-06-22 DIAGNOSIS — M5127 Other intervertebral disc displacement, lumbosacral region: Secondary | ICD-10-CM | POA: Diagnosis not present

## 2018-06-22 DIAGNOSIS — D709 Neutropenia, unspecified: Secondary | ICD-10-CM | POA: Diagnosis not present

## 2018-06-22 DIAGNOSIS — C569 Malignant neoplasm of unspecified ovary: Secondary | ICD-10-CM | POA: Diagnosis not present

## 2018-06-22 DIAGNOSIS — E119 Type 2 diabetes mellitus without complications: Secondary | ICD-10-CM | POA: Diagnosis not present

## 2018-06-22 DIAGNOSIS — Z79899 Other long term (current) drug therapy: Secondary | ICD-10-CM | POA: Insufficient documentation

## 2018-06-22 DIAGNOSIS — R52 Pain, unspecified: Secondary | ICD-10-CM

## 2018-06-22 DIAGNOSIS — R05 Cough: Secondary | ICD-10-CM | POA: Diagnosis not present

## 2018-06-22 DIAGNOSIS — N289 Disorder of kidney and ureter, unspecified: Secondary | ICD-10-CM | POA: Diagnosis not present

## 2018-06-22 DIAGNOSIS — F419 Anxiety disorder, unspecified: Secondary | ICD-10-CM | POA: Diagnosis not present

## 2018-06-22 DIAGNOSIS — D649 Anemia, unspecified: Secondary | ICD-10-CM | POA: Diagnosis not present

## 2018-06-22 DIAGNOSIS — K922 Gastrointestinal hemorrhage, unspecified: Secondary | ICD-10-CM | POA: Diagnosis not present

## 2018-06-22 DIAGNOSIS — K7581 Nonalcoholic steatohepatitis (NASH): Secondary | ICD-10-CM | POA: Diagnosis not present

## 2018-06-22 DIAGNOSIS — M9903 Segmental and somatic dysfunction of lumbar region: Secondary | ICD-10-CM | POA: Diagnosis not present

## 2018-06-22 DIAGNOSIS — Z5111 Encounter for antineoplastic chemotherapy: Secondary | ICD-10-CM | POA: Diagnosis not present

## 2018-06-22 DIAGNOSIS — K31819 Angiodysplasia of stomach and duodenum without bleeding: Secondary | ICD-10-CM | POA: Diagnosis not present

## 2018-06-22 LAB — CBC WITH DIFFERENTIAL/PLATELET
Abs Immature Granulocytes: 0.05 10*3/uL (ref 0.00–0.07)
Basophils Absolute: 0 10*3/uL (ref 0.0–0.1)
Basophils Relative: 0 %
Eosinophils Absolute: 0.1 10*3/uL (ref 0.0–0.5)
Eosinophils Relative: 1 %
HCT: 25.5 % — ABNORMAL LOW (ref 36.0–46.0)
Hemoglobin: 8.6 g/dL — ABNORMAL LOW (ref 12.0–15.0)
Immature Granulocytes: 1 %
Lymphocytes Relative: 21 %
Lymphs Abs: 1.3 10*3/uL (ref 0.7–4.0)
MCH: 36.9 pg — ABNORMAL HIGH (ref 26.0–34.0)
MCHC: 33.7 g/dL (ref 30.0–36.0)
MCV: 109.4 fL — ABNORMAL HIGH (ref 80.0–100.0)
Monocytes Absolute: 1.3 10*3/uL — ABNORMAL HIGH (ref 0.1–1.0)
Monocytes Relative: 22 %
Neutro Abs: 3.2 10*3/uL (ref 1.7–7.7)
Neutrophils Relative %: 55 %
Platelets: 251 10*3/uL (ref 150–400)
RBC: 2.33 MIL/uL — ABNORMAL LOW (ref 3.87–5.11)
RDW: 18.5 % — ABNORMAL HIGH (ref 11.5–15.5)
WBC: 5.8 10*3/uL (ref 4.0–10.5)
nRBC: 0 % (ref 0.0–0.2)

## 2018-06-22 LAB — COMPREHENSIVE METABOLIC PANEL
ALT: 57 U/L — ABNORMAL HIGH (ref 0–44)
AST: 90 U/L — ABNORMAL HIGH (ref 15–41)
Albumin: 3.8 g/dL (ref 3.5–5.0)
Alkaline Phosphatase: 75 U/L (ref 38–126)
Anion gap: 13 (ref 5–15)
BUN: 33 mg/dL — ABNORMAL HIGH (ref 8–23)
CO2: 25 mmol/L (ref 22–32)
Calcium: 8.7 mg/dL — ABNORMAL LOW (ref 8.9–10.3)
Chloride: 100 mmol/L (ref 98–111)
Creatinine, Ser: 1.95 mg/dL — ABNORMAL HIGH (ref 0.44–1.00)
GFR calc Af Amer: 31 mL/min — ABNORMAL LOW (ref >60)
GFR calc non Af Amer: 27 mL/min — ABNORMAL LOW (ref >60)
Glucose, Bld: 114 mg/dL — ABNORMAL HIGH (ref 70–99)
Potassium: 3.7 mmol/L (ref 3.5–5.1)
Sodium: 138 mmol/L (ref 135–145)
Total Bilirubin: 0.5 mg/dL (ref 0.3–1.2)
Total Protein: 6.8 g/dL (ref 6.5–8.1)

## 2018-06-22 MED ORDER — CYCLOBENZAPRINE HCL 10 MG PO TABS: ORAL_TABLET | ORAL | 1 refills | Status: DC

## 2018-06-22 MED ORDER — SODIUM CHLORIDE 0.9 % IV SOLN
Freq: Once | INTRAVENOUS | Status: AC
  Administered 2018-06-22: 11:00:00 via INTRAVENOUS
  Filled 2018-06-22: qty 250

## 2018-06-22 MED ORDER — SODIUM CHLORIDE 0.9 % IV SOLN
1600.0000 mg | Freq: Once | INTRAVENOUS | Status: AC
  Administered 2018-06-22: 12:00:00 1600 mg via INTRAVENOUS
  Filled 2018-06-22: qty 15.78

## 2018-06-22 MED ORDER — ONDANSETRON 8 MG PO TBDP: 8.0000 mg | ORAL_TABLET | Freq: Three times a day (TID) | ORAL | 0 refills | Status: DC | PRN

## 2018-06-22 MED ORDER — SODIUM CHLORIDE 0.9% FLUSH
10.0000 mL | Freq: Once | INTRAVENOUS | Status: AC
  Administered 2018-06-22: 10 mL via INTRAVENOUS
  Filled 2018-06-22: qty 10

## 2018-06-22 MED ORDER — PROCHLORPERAZINE MALEATE 10 MG PO TABS
10.0000 mg | ORAL_TABLET | Freq: Once | ORAL | Status: AC
  Administered 2018-06-22: 11:00:00 10 mg via ORAL
  Filled 2018-06-22: qty 1

## 2018-06-22 MED ORDER — HEPARIN SOD (PORK) LOCK FLUSH 100 UNIT/ML IV SOLN
500.0000 [IU] | Freq: Once | INTRAVENOUS | Status: AC | PRN
  Administered 2018-06-22: 500 [IU]
  Filled 2018-06-22: qty 5

## 2018-06-22 NOTE — Progress Notes (Signed)
MD made aware of pt.'s elevated Cr at 1.95 and AST at 90. Per MD okay to proceed with treatment. MD also made aware of pt complaining of "blurry vision that has been going on for awhile", per pt. No new orders given at this time and okay to proceed with treatment as planned today. Per MD to relay to pt to seek out a recommendation from an ophthalmologist. Pt updated and verbalizes understanding.  Varina Hulon [employer]

## 2018-06-22 NOTE — Progress Notes (Signed)
Cr 1.95, AST 90, ok to treat per md

## 2018-06-22 NOTE — Progress Notes (Signed)
Patient stated that she needed refills on her cyclobenzaprine and ondansetron. Patient also stated that she has had migraine for last week and this morning.

## 2018-06-23 LAB — CA 125: Cancer Antigen (CA) 125: 46.5 U/mL — ABNORMAL HIGH (ref 0.0–38.1)

## 2018-06-28 NOTE — Progress Notes (Signed)
Jessica Burgess  [phone] [phone]  ID: [name] OB: Feb 05, 1956  MR#: [id number]  MVH#:[id number]  Patient Care Team: Steele Sizer, MD as PCP - General (Family Medicine) Lloyd Huger, MD as Consulting Physician (Oncology) Mellody Drown, MD as Consulting Physician (Obstetrics and Gynecology) Cathi Roan, [facility] (Pharmacist) Benedetto Goad, RN as Case Manager Clent Jacks, RN as Registered Nurse  CHIEF COMPLAINT: Stage IIIc high-grade serous ovarian carcinoma  INTERVAL HISTORY: Patient returns to clinic today for further evaluation and consideration of cycle 5, day 8 of single agent gemcitabine.  She noticed increased shortness of breath and a mild cough which she attributes to working outside with excessive pollen.  She is having increased back pain, but forgot to take her narcotics this morning.  She otherwise feels well.  She denies any fevers.  She does not complain of weakness or fatigue. She continues to have a mild peripheral neuropathy that is chronic and unchanged. She has no other neurologic complaints. She has good appetite and denies weight loss.  She denies any chest pain or hemoptysis.  She denies any nausea, vomiting, constipation, or diarrhea.  She denies any abdominal pain or bloating.  She has no urinary complaints.  Patient offers no further specific complaints today.  REVIEW OF SYSTEMS:   Review of Systems  Constitutional: Negative for fever, malaise/fatigue and weight loss.  HENT: Negative.  Negative for sore throat.   Eyes: Positive for blurred vision.  Respiratory: Positive for cough and shortness of breath.   Cardiovascular: Negative.  Negative for chest pain and leg swelling.  Gastrointestinal: Negative.  Negative for abdominal pain, blood in stool, constipation, diarrhea, melena, nausea and vomiting.  Genitourinary: Negative.  Negative for dysuria.  Musculoskeletal: Positive for back pain and  joint pain. Negative for neck pain.  Skin: Negative.  Negative for itching and rash.  Neurological: Positive for tingling and sensory change. Negative for focal weakness and weakness.  Psychiatric/Behavioral: Negative.  The patient is not nervous/anxious.     As per HPI. Otherwise, a complete review of systems is negative.  PAST MEDICAL HISTORY: Past Medical History:  Diagnosis Date   Allergic rhinitis, cause unspecified    Anxiety state, unspecified    Arthritis    Asthma    only when sick    Backache, unspecified    Bronchitis    hx of when get sick   Cancer [facility])    skin cancer , basal cell    Cancer ([place]) 11/2016   ovarian   Cervicalgia    Complication of anesthesia    Dermatophytosis of nail    Dysmetabolic syndrome X    Encounter for long-term (current) use of other medications    Esophageal reflux    Insomnia, unspecified    Leukocytosis, unspecified    Migraine without aura, without mention of intractable migraine without mention of status migrainosus    Other and unspecified hyperlipidemia    Other malaise and fatigue    Overweight(278.02)    Personal history of chemotherapy now   ovarian   PONV (postoperative nausea and vomiting)    Spinal stenosis in cervical region    Symptomatic menopausal or female climacteric states    Unspecified disorder of skin and subcutaneous tissue    Unspecified vitamin D deficiency     PAST SURGICAL HISTORY: Past Surgical History:  Procedure Laterality Date   ABDOMINAL HYSTERECTOMY     ANTERIOR CERVICAL DECOMP/DISCECTOMY FUSION N/A 06/07/2015   Procedure: Cervical three -  four and Cervical six- seven anterior cervical decompression with fusion interbody prosthesis plating and bonegraft;  Surgeon: Newman Pies, MD;  Location: [place] [facility];  Service: Neurosurgery;  Laterality: N/A;  C34 and C67 anterior cervical decompression with fusion interbody prosthesis plating and bonegraft   BACK SURGERY      x2 Lower    EVACUATION OF CERVICAL HEMATOMA N/A 06/14/2015   Procedure: EVACUATION OF CERVICAL HEMATOMA;  Surgeon: Newman Pies, MD;  Location: [place] [facility];  Service: Neurosurgery;  Laterality: N/A;   NECK SURGERY     x3   TONSILLECTOMY      FAMILY HISTORY: Family History  Problem Relation Age of Onset   Depression Mother    Migraines Mother    Dementia Father    Diabetes Father    Hyperlipidemia Father    Hyperlipidemia Brother    Hyperlipidemia Brother    Breast cancer Paternal Aunt        31s    ADVANCED DIRECTIVES (Y/N):  N  HEALTH MAINTENANCE: Social History   Tobacco Use   Smoking status: Former Smoker    Packs/day: 1.50    Years: 20.00    Pack years: 30.00    Types: Cigarettes    Start date: 03/19/1979    Last attempt to quit: 08/25/1999    Years since quitting: 18.8   Smokeless tobacco: Never Used   Tobacco comment: smoking cessation materials not required  Substance Use Topics   Alcohol use: Not Currently    Alcohol/week: 0.0 standard drinks   Drug use: No     Colonoscopy:  PAP:  Bone density:  Lipid panel:  No Known Allergies  Current Outpatient Medications  Medication Sig Dispense Refill   albuterol (PROVENTIL HFA;VENTOLIN HFA) 108 (90 Base) MCG/ACT inhaler Inhale 2 puffs into the lungs every 6 (six) hours as needed for wheezing or shortness of breath. 1 Inhaler 2   albuterol (PROVENTIL) (2.5 MG/3ML) 0.083% nebulizer solution Take 3 mLs (2.5 mg total) by nebulization every 6 (six) hours as needed for wheezing or shortness of breath. 75 mL 2   Ascorbic Acid (VITAMIN C PO) Take 1 tablet by mouth daily.      budesonide-formoterol (SYMBICORT) 160-4.5 MCG/ACT inhaler Inhale 2 puffs into the lungs 2 (two) times daily.     Cholecalciferol (VITAMIN D-1000 MAX ST) 1000 UNITS tablet Take 1 tablet by mouth 2 (two) times daily.     Cyanocobalamin (B-12 PO) Take 1 tablet by mouth daily.      cyclobenzaprine (FLEXERIL) 10 MG tablet Take  one tablet every 8 hours as needed 30 tablet 1   DULoxetine (CYMBALTA) 60 MG capsule TAKE 1 CAPSULE(60 MG) BY MOUTH DAILY 180 capsule 1   estradiol (ESTRACE) 0.5 MG tablet Take 1 tablet (0.5 mg total) by mouth daily. 90 tablet 4   ferrous sulfate (IRON SUPPLEMENT) 325 (65 FE) MG tablet Take 1 tablet by mouth daily.     fluticasone (FLONASE) 50 MCG/ACT nasal spray Place 2 sprays into both nostrils as needed. 48 g 1   gabapentin (NEURONTIN) 300 MG capsule Take 1-3 capsules (300-900 mg total) by mouth 3 (three) times daily. 300 mg twice a day and 900 mg at night 540 capsule 1   ipratropium (ATROVENT HFA) 17 MCG/ACT inhaler Inhale 2 puffs into the lungs every 6 (six) hours as needed.      lidocaine (XYLOCAINE) 2 % solution Use as directed 15 mLs in the mouth or throat as needed for mouth pain. 100 mL 0  Lysine 1000 MG TABS Take 1 tablet by mouth daily.      magic mouthwash w/lidocaine SOLN Take 5 mLs by mouth 3 (three) times daily. 315 mL 0   Magnesium Oxide 500 MG CAPS Take 500 mg by mouth 2 (two) times daily.      Multiple Vitamins-Minerals (MULTIVITAMIN PO) Take 1 tablet by mouth daily.      Omega-3 Fatty Acids (FISH OIL PO) Take 1 capsule by mouth 2 (two) times daily.      ondansetron (ZOFRAN-ODT) 8 MG disintegrating tablet Take 1 tablet (8 mg total) by mouth every 8 (eight) hours as needed for nausea or vomiting. 60 tablet 0   oxyCODONE-acetaminophen (PERCOCET) 10-325 MG tablet Take 1 tablet by mouth every 6 (six) hours as needed for pain. 120 tablet 0   potassium gluconate 595 MG TABS tablet Take 595 mg by mouth daily.     SUMAtriptan (IMITREX) 100 MG tablet May repeat in 2 hours if headache persists or recurs. 9 tablet 0   temazepam (RESTORIL) 15 MG capsule Take 1 capsule (15 mg total) by mouth at bedtime as needed for sleep. 30 capsule 0   triamcinolone (KENALOG) 0.1 % paste Use as directed 1 application in the mouth or throat 2 (two) times daily. 5 g 2   urea (CARMOL) 10 %  cream Apply topically as needed. 410 g 1   valACYclovir (VALTREX) 1000 MG tablet TAKE 1 TABLET(1000 MG) BY MOUTH THREE TIMES DAILY FOR 7 DAYS 21 tablet 0   No current facility-administered medications for this visit.    Facility-Administered Medications Ordered in Other Visits  Medication Dose Route Frequency Provider Last Rate Last Dose   0.9 %  sodium chloride infusion   Intravenous Once Grayland Ormond, Kathlene November, MD       0.9 %  sodium chloride infusion   Intravenous Once Grayland Ormond, Kathlene November, MD       dexamethasone (DECADRON) 20 mg in sodium chloride 0.9 % 50 mL IVPB  20 mg Intravenous Once Lloyd Huger, MD       dexamethasone (DECADRON) injection 10 mg  10 mg Intravenous Once Lloyd Huger, MD       sodium chloride flush (NS) 0.9 % injection 10 mL  10 mL Intravenous PRN Lloyd Huger, MD   10 mL at 06/16/17 0854    OBJECTIVE: Vitals:   06/29/18 0937  BP: (!) 190/97  Pulse: 81  Resp: 18  Temp: (!) 97.4 F (36.3 C)     Body mass index is 31.26 kg/m.    ECOG FS:0 - Asymptomatic  General: Well-developed, well-nourished, no acute distress. Eyes: Pink conjunctiva, anicteric sclera. HEENT: Normocephalic, moist mucous membranes. Lungs: Clear to auscultation bilaterally. Heart: Regular rate and rhythm. No rubs, murmurs, or gallops. Abdomen: Soft, nontender, nondistended. No organomegaly noted, normoactive bowel sounds. Musculoskeletal: No edema, cyanosis, or clubbing. Neuro: Alert, answering all questions appropriately. Cranial nerves grossly intact. Skin: No rashes or petechiae noted. Psych: Normal affect.  LAB RESULTS:  Lab Results  Component Value Date   NA 137 06/29/2018   K 3.8 06/29/2018   CL 102 06/29/2018   CO2 27 06/29/2018   GLUCOSE 96 06/29/2018   BUN 24 (H) 06/29/2018   CREATININE 1.37 (H) 06/29/2018   CALCIUM 8.7 (L) 06/29/2018   PROT 6.2 (L) 06/29/2018   ALBUMIN 3.1 (L) 06/29/2018   AST 113 (H) 06/29/2018   ALT 68 (H) 06/29/2018    ALKPHOS 69 06/29/2018   BILITOT 0.8 06/29/2018  GFRNONAA 41 (L) 06/29/2018   GFRAA 47 (L) 06/29/2018    Lab Results  Component Value Date   WBC 3.1 (L) 06/29/2018   NEUTROABS 0.8 (L) 06/29/2018   HGB 8.3 (L) 06/29/2018   HCT 24.1 (L) 06/29/2018   MCV 105.7 (H) 06/29/2018   PLT 140 (L) 06/29/2018     STUDIES: No results found.  ONCOLOGY HISTORY: Patient underwent debulking surgery at [facility] on November 19, 2016. She received one infusion preoperative infusion of Keytruda on November 11, 2016 as part of a clinical trial.  She initiated adjuvant carboplatinum and Taxol on December 16, 2016.  This was continued through April 28, 2017 at which time Taxol was discontinued secondary to worsening neuropathy.  Patient then received single agent carboplatinum for additional 2 infusions.  She was then noted to have progression of disease and was switched to Avastin and doxorubicin on August 18, 2017 this was discontinued on November 24, 2017 secondary to declining performance status.  Palliative gemcitabine was initiated on February 20, 2018.   ASSESSMENT: Stage IIIc high-grade serous ovarian carcinoma  PLAN:  1. Stage IIIc high-grade serous ovarian carcinoma: See oncology history as above.  PET scan results from May 07, 2018 reviewed independently with significant improvement in disease burden.  Patient CA-125 is now trending down again at 46.5. Patient has inquired about intraperitoneal chemotherapy, but the benefit is unclear with increased toxicity.  Despite neutropenia, will proceed with cycle 5, day 8 of gemcitabine today.  Return to clinic in 1 week for further evaluation and consideration of cycle 5, day 15.      2. Pain: Well controlled.  Continue oxycodone as needed.  Patient's back pain resolved with IV Dilaudid in clinic today. 3. Anxiety: Chronic and unchanged.  Continue Xanax as prescribed. 4.  Peripheral neuropathy: Chronic and unchanged.  Continue gabapentin as  prescribed.  Patient has agreed to acupuncture.   5.  Anemia: Patient's hemoglobin is slowly trending down is now 8.3, monitor. 6.  Shoulder pain: Patient does not complain of this today.  Continue cortisone injections as needed.  Patient is also indicated she may need rotator cuff surgery in the future.  Okay to proceed from an oncology standpoint, but will have to be coordinated with chemotherapy. 7.  Thrombocytopenia: Mild, proceed with treatment. 8.  Neutropenia: Proceed cautiously with treatment as above. 9.  Elevated liver enzymes: AST and ALT remain mildly elevated and have trended up slightly today.  Proceed with treatment as above. 10.  Tooth pain: Patient does not complain of this today.  Continue follow-up with dentist as scheduled. 11.  Renal insufficiency: Improved this week.  Patient's creatinine is 1.37. 12.  Shortness of breath: Patient was instructed to use her albuterol inhaler as directed.  Monitor.   Patient expressed understanding and was in agreement with this plan. She also understands that She can call clinic at any time with any questions, concerns, or complaints.   Cancer Staging Ovarian cancer, unspecified laterality ([place]) Staging form: Ovary, Fallopian Tube, and Primary Peritoneal Carcinoma, AJCC 8th Edition - Clinical: No stage assigned - Unsigned   Lloyd Huger, MD   06/29/2018 2:22 PM

## 2018-06-29 ENCOUNTER — Emergency Department: Payer: PPO

## 2018-06-29 ENCOUNTER — Other Ambulatory Visit: Payer: Self-pay

## 2018-06-29 ENCOUNTER — Inpatient Hospital Stay (HOSPITAL_BASED_OUTPATIENT_CLINIC_OR_DEPARTMENT_OTHER): Payer: PPO | Admitting: Oncology

## 2018-06-29 ENCOUNTER — Encounter: Payer: Self-pay | Admitting: Emergency Medicine

## 2018-06-29 ENCOUNTER — Inpatient Hospital Stay: Payer: PPO

## 2018-06-29 ENCOUNTER — Emergency Department
Admission: EM | Admit: 2018-06-29 | Discharge: 2018-06-30 | Disposition: A | Discharge status: Home / Self Care | Payer: PPO | Attending: Emergency Medicine | Admitting: Emergency Medicine

## 2018-06-29 VITALS — BP 190/97 | HR 81 | Temp 97.4°F | Resp 18 | Wt 170.9 lb

## 2018-06-29 VITALS — BP 170/94 | HR 76 | Resp 18

## 2018-06-29 DIAGNOSIS — G8929 Other chronic pain: Secondary | ICD-10-CM

## 2018-06-29 DIAGNOSIS — M549 Dorsalgia, unspecified: Principal | ICD-10-CM

## 2018-06-29 DIAGNOSIS — Z20828 Contact with and (suspected) exposure to other viral communicable diseases: Secondary | ICD-10-CM | POA: Diagnosis not present

## 2018-06-29 DIAGNOSIS — J40 Bronchitis, not specified as acute or chronic: Secondary | ICD-10-CM

## 2018-06-29 DIAGNOSIS — R05 Cough: Secondary | ICD-10-CM | POA: Diagnosis not present

## 2018-06-29 DIAGNOSIS — E038 Other specified hypothyroidism: Secondary | ICD-10-CM | POA: Diagnosis not present

## 2018-06-29 DIAGNOSIS — I158 Other secondary hypertension: Secondary | ICD-10-CM | POA: Diagnosis not present

## 2018-06-29 DIAGNOSIS — D709 Neutropenia, unspecified: Secondary | ICD-10-CM

## 2018-06-29 DIAGNOSIS — N289 Disorder of kidney and ureter, unspecified: Secondary | ICD-10-CM | POA: Diagnosis not present

## 2018-06-29 DIAGNOSIS — R0602 Shortness of breath: Secondary | ICD-10-CM | POA: Diagnosis not present

## 2018-06-29 DIAGNOSIS — J45909 Unspecified asthma, uncomplicated: Secondary | ICD-10-CM | POA: Insufficient documentation

## 2018-06-29 DIAGNOSIS — C569 Malignant neoplasm of unspecified ovary: Secondary | ICD-10-CM | POA: Diagnosis not present

## 2018-06-29 DIAGNOSIS — Z87891 Personal history of nicotine dependence: Secondary | ICD-10-CM | POA: Insufficient documentation

## 2018-06-29 DIAGNOSIS — Z79899 Other long term (current) drug therapy: Secondary | ICD-10-CM | POA: Insufficient documentation

## 2018-06-29 DIAGNOSIS — A419 Sepsis, unspecified organism: Secondary | ICD-10-CM

## 2018-06-29 DIAGNOSIS — G629 Polyneuropathy, unspecified: Secondary | ICD-10-CM

## 2018-06-29 DIAGNOSIS — R509 Fever, unspecified: Secondary | ICD-10-CM | POA: Insufficient documentation

## 2018-06-29 LAB — CBC WITH DIFFERENTIAL/PLATELET
Abs Immature Granulocytes: 0.31 10*3/uL — ABNORMAL HIGH (ref 0.00–0.07)
Abs Immature Granulocytes: 0.1 10*3/uL — ABNORMAL HIGH (ref 0.00–0.07)
Basophils Absolute: 0 10*3/uL (ref 0.0–0.1)
Basophils Absolute: 0 10*3/uL (ref 0.0–0.1)
Basophils Relative: 1 %
Basophils Relative: 0 %
Eosinophils Absolute: 0 10*3/uL (ref 0.0–0.5)
Eosinophils Absolute: 0 10*3/uL (ref 0.0–0.5)
Eosinophils Relative: 1 %
Eosinophils Relative: 0 %
HCT: 24.1 % — ABNORMAL LOW (ref 36.0–46.0)
HCT: 26 % — ABNORMAL LOW (ref 36.0–46.0)
Hemoglobin: 8.3 g/dL — ABNORMAL LOW (ref 12.0–15.0)
Hemoglobin: 8.8 g/dL — ABNORMAL LOW (ref 12.0–15.0)
Immature Granulocytes: 10 %
Immature Granulocytes: 2 %
Lymphocytes Relative: 37 %
Lymphocytes Relative: 5 %
Lymphs Abs: 1.2 10*3/uL (ref 0.7–4.0)
Lymphs Abs: 0.2 10*3/uL — ABNORMAL LOW (ref 0.7–4.0)
MCH: 36.4 pg — ABNORMAL HIGH (ref 26.0–34.0)
MCH: 35.9 pg — ABNORMAL HIGH (ref 26.0–34.0)
MCHC: 34.4 g/dL (ref 30.0–36.0)
MCHC: 33.8 g/dL (ref 30.0–36.0)
MCV: 105.7 fL — ABNORMAL HIGH (ref 80.0–100.0)
MCV: 106.1 fL — ABNORMAL HIGH (ref 80.0–100.0)
Monocytes Absolute: 0.7 10*3/uL (ref 0.1–1.0)
Monocytes Absolute: 0.7 10*3/uL (ref 0.1–1.0)
Monocytes Relative: 24 %
Monocytes Relative: 14 %
Neutro Abs: 0.8 10*3/uL — ABNORMAL LOW (ref 1.7–7.7)
Neutro Abs: 3.9 10*3/uL (ref 1.7–7.7)
Neutrophils Relative %: 27 %
Neutrophils Relative %: 79 %
Platelets: 140 10*3/uL — ABNORMAL LOW (ref 150–400)
Platelets: 94 10*3/uL — ABNORMAL LOW (ref 150–400)
RBC: 2.28 MIL/uL — ABNORMAL LOW (ref 3.87–5.11)
RBC: 2.45 MIL/uL — ABNORMAL LOW (ref 3.87–5.11)
RDW: 15.7 % — ABNORMAL HIGH (ref 11.5–15.5)
RDW: 15.9 % — ABNORMAL HIGH (ref 11.5–15.5)
WBC: 3.1 10*3/uL — ABNORMAL LOW (ref 4.0–10.5)
WBC: 5 10*3/uL (ref 4.0–10.5)
nRBC: 5.1 % — ABNORMAL HIGH (ref 0.0–0.2)
nRBC: 2.2 % — ABNORMAL HIGH (ref 0.0–0.2)

## 2018-06-29 LAB — URINALYSIS, COMPLETE (UACMP) WITH MICROSCOPIC
Bilirubin Urine: NEGATIVE
Glucose, UA: NEGATIVE mg/dL
Ketones, ur: NEGATIVE mg/dL
Leukocytes,Ua: NEGATIVE
Nitrite: NEGATIVE
Protein, ur: 100 mg/dL — AB
Specific Gravity, Urine: 1.011 (ref 1.005–1.030)
pH: 5 (ref 5.0–8.0)

## 2018-06-29 LAB — COMPREHENSIVE METABOLIC PANEL
ALT: 68 U/L — ABNORMAL HIGH (ref 0–44)
ALT: 81 U/L — ABNORMAL HIGH (ref 0–44)
AST: 113 U/L — ABNORMAL HIGH (ref 15–41)
AST: 160 U/L — ABNORMAL HIGH (ref 15–41)
Albumin: 3.1 g/dL — ABNORMAL LOW (ref 3.5–5.0)
Albumin: 3.1 g/dL — ABNORMAL LOW (ref 3.5–5.0)
Alkaline Phosphatase: 69 U/L (ref 38–126)
Alkaline Phosphatase: 85 U/L (ref 38–126)
Anion gap: 8 (ref 5–15)
Anion gap: 11 (ref 5–15)
BUN: 24 mg/dL — ABNORMAL HIGH (ref 8–23)
BUN: 27 mg/dL — ABNORMAL HIGH (ref 8–23)
CO2: 27 mmol/L (ref 22–32)
CO2: 25 mmol/L (ref 22–32)
Calcium: 8.7 mg/dL — ABNORMAL LOW (ref 8.9–10.3)
Calcium: 8.3 mg/dL — ABNORMAL LOW (ref 8.9–10.3)
Chloride: 102 mmol/L (ref 98–111)
Chloride: 99 mmol/L (ref 98–111)
Creatinine, Ser: 1.37 mg/dL — ABNORMAL HIGH (ref 0.44–1.00)
Creatinine, Ser: 1.43 mg/dL — ABNORMAL HIGH (ref 0.44–1.00)
GFR calc Af Amer: 47 mL/min — ABNORMAL LOW (ref >60)
GFR calc Af Amer: 45 mL/min — ABNORMAL LOW (ref >60)
GFR calc non Af Amer: 41 mL/min — ABNORMAL LOW (ref >60)
GFR calc non Af Amer: 39 mL/min — ABNORMAL LOW (ref >60)
Glucose, Bld: 96 mg/dL (ref 70–99)
Glucose, Bld: 113 mg/dL — ABNORMAL HIGH (ref 70–99)
Potassium: 3.8 mmol/L (ref 3.5–5.1)
Potassium: 3.1 mmol/L — ABNORMAL LOW (ref 3.5–5.1)
Sodium: 137 mmol/L (ref 135–145)
Sodium: 135 mmol/L (ref 135–145)
Total Bilirubin: 0.8 mg/dL (ref 0.3–1.2)
Total Bilirubin: 1.2 mg/dL (ref 0.3–1.2)
Total Protein: 6.2 g/dL — ABNORMAL LOW (ref 6.5–8.1)
Total Protein: 5.8 g/dL — ABNORMAL LOW (ref 6.5–8.1)

## 2018-06-29 LAB — PROTIME-INR
INR: 1 (ref 0.8–1.2)
Prothrombin Time: 12.9 seconds (ref 11.4–15.2)

## 2018-06-29 LAB — LACTIC ACID, PLASMA: Lactic Acid, Venous: 1.7 mmol/L (ref 0.5–1.9)

## 2018-06-29 LAB — SARS CORONAVIRUS 2 BY RT PCR (HOSPITAL ORDER, PERFORMED IN ~~LOC~~ HOSPITAL LAB): SARS Coronavirus 2: NEGATIVE

## 2018-06-29 MED ORDER — PREDNISONE 20 MG PO TABS
50.0000 mg | ORAL_TABLET | Freq: Once | ORAL | Status: AC
  Administered 2018-06-29: 50 mg via ORAL
  Filled 2018-06-29: qty 2

## 2018-06-29 MED ORDER — SODIUM CHLORIDE 0.9 % IV BOLUS
500.0000 mL | Freq: Once | INTRAVENOUS | Status: AC
  Administered 2018-06-29: 500 mL via INTRAVENOUS

## 2018-06-29 MED ORDER — ALBUTEROL SULFATE HFA 108 (90 BASE) MCG/ACT IN AERS
2.0000 | INHALATION_SPRAY | RESPIRATORY_TRACT | Status: DC | PRN
  Administered 2018-06-29: 2 via RESPIRATORY_TRACT
  Filled 2018-06-29: qty 6.7

## 2018-06-29 MED ORDER — SODIUM CHLORIDE 0.9% FLUSH
3.0000 mL | Freq: Once | INTRAVENOUS | Status: AC
  Administered 2018-06-29: 3 mL via INTRAVENOUS

## 2018-06-29 MED ORDER — HEPARIN SOD (PORK) LOCK FLUSH 100 UNIT/ML IV SOLN
500.0000 [IU] | Freq: Once | INTRAVENOUS | Status: AC | PRN
  Administered 2018-06-29: 500 [IU]
  Filled 2018-06-29: qty 5

## 2018-06-29 MED ORDER — SODIUM CHLORIDE 0.9 % IV SOLN
Freq: Once | INTRAVENOUS | Status: AC
  Administered 2018-06-29: 11:00:00 via INTRAVENOUS
  Filled 2018-06-29: qty 250

## 2018-06-29 MED ORDER — LEVOFLOXACIN IN D5W 500 MG/100ML IV SOLN
500.0000 mg | Freq: Once | INTRAVENOUS | Status: AC
  Administered 2018-06-29: 23:00:00 500 mg via INTRAVENOUS
  Filled 2018-06-29: qty 100

## 2018-06-29 MED ORDER — HYDROMORPHONE HCL 1 MG/ML IJ SOLN
1.0000 mg | Freq: Once | INTRAMUSCULAR | Status: AC
  Administered 2018-06-29: 1 mg via INTRAVENOUS
  Filled 2018-06-29: qty 1

## 2018-06-29 MED ORDER — SODIUM CHLORIDE 0.9 % IV SOLN
1600.0000 mg | Freq: Once | INTRAVENOUS | Status: AC
  Administered 2018-06-29: 1600 mg via INTRAVENOUS
  Filled 2018-06-29: qty 26.3

## 2018-06-29 MED ORDER — LORAZEPAM 1 MG PO TABS
1.0000 mg | ORAL_TABLET | Freq: Once | ORAL | Status: AC
  Administered 2018-06-29: 1 mg via ORAL
  Filled 2018-06-29: qty 1

## 2018-06-29 MED ORDER — PROCHLORPERAZINE MALEATE 10 MG PO TABS
10.0000 mg | ORAL_TABLET | Freq: Once | ORAL | Status: AC
  Administered 2018-06-29: 10 mg via ORAL
  Filled 2018-06-29: qty 1

## 2018-06-29 MED ORDER — SODIUM CHLORIDE 0.9% FLUSH
10.0000 mL | Freq: Once | INTRAVENOUS | Status: AC
  Administered 2018-06-29: 10 mL via INTRAVENOUS
  Filled 2018-06-29: qty 10

## 2018-06-29 NOTE — ED Triage Notes (Signed)
Pt to triage via w/c with no distress noted, mask in place; pt reports has been outside today picking up sticks; began having fever 102.2 with SHOB and prod cough green sputum; currently receiving chemo for Stage 3 ovarian CA, portacath in place

## 2018-06-29 NOTE — ED Provider Notes (Addendum)
[facility] Emergency Department Provider Note  ____________________________________________   I have reviewed the triage vital signs and the nursing notes. Where available I have reviewed prior notes and, if possible and indicated, outside hospital notes.    HISTORY  Chief Complaint Fever    HPI Jessica Burgess is a 63 y.o. female  With a history of chronic back pain on pain medications ovarian cancer, on chemotherapy vascular therapy was today, has had a slight cough since Sunday morning, today cough seem to be slightly worse, occasionally productive, a fever she states of 102.2 at home around 7:00.  Did take Tylenol however around 6 and felt that the fever broke on the way in.  No dysuria no urinary frequency no abdominal pain no diarrhea.  Has chronic mild constipation with no change in that.  No rash.  No headache no stiff neck. No other prior treatment no other alleviating or aggravating symptoms no other complaints   Past Medical History:  Diagnosis Date  . Allergic rhinitis, cause unspecified   . Anxiety state, unspecified   . Arthritis   . Asthma    only when sick   . Backache, unspecified   . Bronchitis    hx of when get sick  . Cancer ([place])    skin cancer , basal cell   . Cancer ([place]) 11/2016   ovarian  . Cervicalgia   . Complication of anesthesia   . Dermatophytosis of nail   . Dysmetabolic syndrome X   . Encounter for long-term (current) use of other medications   . Esophageal reflux   . Insomnia, unspecified   . Leukocytosis, unspecified   . Migraine without aura, without mention of intractable migraine without mention of status migrainosus   . Other and unspecified hyperlipidemia   . Other malaise and fatigue   . Overweight(278.02)   . Personal history of chemotherapy now   ovarian  . PONV (postoperative nausea and vomiting)   . Spinal stenosis in cervical region   . Symptomatic menopausal or female climacteric states   .  Unspecified disorder of skin and subcutaneous tissue   . Unspecified vitamin D deficiency     Patient Active Problem List   Diagnosis Date Noted  . 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 Mucositis due to chemotherapy 09/02/2017  . Goals of care, counseling/discussion 08/08/2017  . Hypothyroidism due to medication 07/28/2017  . Genetic testing 12/18/2016  . Migraines 12/01/2016  . Postoperative seroma of subcutaneous tissue after non-dermatologic procedure 12/01/2016  . Transaminitis 12/01/2016  . Anemia associated with acute blood loss 11/20/2016  . Ovarian cancer, unspecified laterality ([place]) 11/12/2016  . 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 Examination of participant in clinical trial 11/01/2016  . Chronic right-sided low back pain with right-sided sciatica 02/02/2016  . History of neck surgery 08/01/2015  . Hematoma 06/14/2015  . Perennial allergic rhinitis 05/05/2015  . Generalized anxiety disorder 02/03/2015  . Back pain, chronic 08/25/2014  . Insomnia, persistent 08/25/2014  . Chronic cervical pain 08/25/2014  . Major depression, chronic ([place]) 08/25/2014  . Dyslipidemia 08/25/2014  . Gastro-esophageal reflux disease without esophagitis 08/25/2014  . H/O high risk medication treatment 08/25/2014  . Blood glucose elevated 08/25/2014  . Migraine without aura and without status migrainosus, not intractable 08/25/2014  . Climacteric 08/25/2014  . Dysmetabolic syndrome 75/12/2583  . Fungal infection of toenail 08/25/2014  . Obesity (BMI 30.0-34.9) 08/25/2014  . Vitamin D deficiency 08/25/2014  . Engages in  travel abroad 08/25/2014  . Cervical disc disorder with radiculopathy 04/28/2013    Past Surgical History:  Procedure Laterality Date  . ABDOMINAL HYSTERECTOMY    . ANTERIOR CERVICAL DECOMP/DISCECTOMY FUSION N/A 06/07/2015   Procedure: Cervical three - four and Cervical six- seven anterior cervical decompression with fusion interbody  prosthesis plating and bonegraft;  Surgeon: Newman Pies, MD;  Location: [facility];  Service: Neurosurgery;  Laterality: N/A;  C34 and C67 anterior cervical decompression with fusion interbody prosthesis plating and bonegraft  . BACK SURGERY     x2 Lower   . EVACUATION OF CERVICAL HEMATOMA N/A 06/14/2015   Procedure: EVACUATION OF CERVICAL HEMATOMA;  Surgeon: Newman Pies, MD;  Location: [facility];  Service: Neurosurgery;  Laterality: N/A;  . NECK SURGERY     x3  . TONSILLECTOMY      Prior to Admission medications   Medication Sig Start Date End Date Taking? Authorizing Provider  albuterol (PROVENTIL HFA;VENTOLIN HFA) 108 (90 Base) MCG/ACT inhaler Inhale 2 puffs into the lungs every 6 (six) hours as needed for wheezing or shortness of breath. 10/04/16   Hubbard Hartshorn, FNP  albuterol (PROVENTIL) (2.5 MG/3ML) 0.083% nebulizer solution Take 3 mLs (2.5 mg total) by nebulization every 6 (six) hours as needed for wheezing or shortness of breath. 05/05/15   Steele Sizer, MD  Ascorbic Acid (VITAMIN C PO) Take 1 tablet by mouth daily.     [provider]  budesonide-formoterol (SYMBICORT) 160-4.5 MCG/ACT inhaler Inhale 2 puffs into the lungs 2 (two) times daily.    [provider]  Cholecalciferol (VITAMIN D-1000 MAX ST) 1000 UNITS tablet Take 1 tablet by mouth 2 (two) times daily.    [provider]  Cyanocobalamin (B-12 PO) Take 1 tablet by mouth daily.     [provider]  cyclobenzaprine (FLEXERIL) 10 MG tablet Take one tablet every 8 hours as needed 06/22/18   Lloyd Huger, MD  DULoxetine (CYMBALTA) 60 MG capsule TAKE 1 CAPSULE(60 MG) BY MOUTH DAILY 06/11/18   Steele Sizer, MD  estradiol (ESTRACE) 0.5 MG tablet Take 1 tablet (0.5 mg total) by mouth daily. 10/28/17   Steele Sizer, MD  ferrous sulfate (IRON SUPPLEMENT) 325 (65 FE) MG tablet Take 1 tablet by mouth daily.    [provider]  fluticasone (FLONASE) 50 MCG/ACT nasal spray  Place 2 sprays into both nostrils as needed. 06/11/18 09/09/18  Steele Sizer, MD  gabapentin (NEURONTIN) 300 MG capsule Take 1-3 capsules (300-900 mg total) by mouth 3 (three) times daily. 300 mg twice a day and 900 mg at night 06/11/18   Steele Sizer, MD  ipratropium (ATROVENT HFA) 17 MCG/ACT inhaler Inhale 2 puffs into the lungs every 6 (six) hours as needed.     [provider]  lidocaine (XYLOCAINE) 2 % solution Use as directed 15 mLs in the mouth or throat as needed for mouth pain. 11/28/17   Jacquelin Hawking, NP  Lysine 1000 MG TABS Take 1 tablet by mouth daily.     [provider]  magic mouthwash w/lidocaine SOLN Take 5 mLs by mouth 3 (three) times daily. 09/02/17   Verlon Au, NP  Magnesium Oxide 500 MG CAPS Take 500 mg by mouth 2 (two) times daily.     [provider]  Multiple Vitamins-Minerals (MULTIVITAMIN PO) Take 1 tablet by mouth daily.     [provider]  Omega-3 Fatty Acids (FISH OIL PO) Take 1 capsule by mouth 2 (two) times daily.  [provider]  ondansetron (ZOFRAN-ODT) 8 MG disintegrating tablet Take 1 tablet (8 mg total) by mouth every 8 (eight) hours as needed for nausea or vomiting. 06/22/18   Lloyd Huger, MD  oxyCODONE-acetaminophen (PERCOCET) 10-325 MG tablet Take 1 tablet by mouth every 6 (six) hours as needed for pain. 06/19/18   Lloyd Huger, MD  potassium gluconate 595 MG TABS tablet Take 595 mg by mouth daily.    [provider]  SUMAtriptan (IMITREX) 100 MG tablet May repeat in 2 hours if headache persists or recurs. 06/10/18   Steele Sizer, MD  temazepam (RESTORIL) 15 MG capsule Take 1 capsule (15 mg total) by mouth at bedtime as needed for sleep. 06/10/18   Steele Sizer, MD  triamcinolone (KENALOG) 0.1 % paste Use as directed 1 application in the mouth or throat 2 (two) times daily. 10/06/17   Verlon Au, NP  urea (CARMOL) 10 % cream Apply topically as needed. 10/06/17   Verlon Au, NP  valACYclovir (VALTREX) 1000 MG tablet TAKE 1 TABLET(1000 MG) BY MOUTH THREE TIMES DAILY FOR 7 DAYS 11/24/17   Lloyd Huger, MD  prochlorperazine (COMPAZINE) 10 MG tablet Take 1 tablet (10 mg total) by mouth every 6 (six) hours as needed (Nausea or vomiting). 12/02/16 08/08/17  Lloyd Huger, MD    Allergies Patient has no known allergies.  Family History  Problem Relation Age of Onset  . Depression Mother   . Migraines Mother   . Dementia Father   . Diabetes Father   . Hyperlipidemia Father   . Hyperlipidemia Brother   . Hyperlipidemia Brother   . Breast cancer Paternal Aunt        41s    Social History Social History   Tobacco Use  . Smoking status: Former Smoker    Packs/day: 1.50    Years: 20.00    Pack years: 30.00    Types: Cigarettes    Start date: 03/19/1979    Last attempt to quit: 08/25/1999    Years since quitting: 18.8  . Smokeless tobacco: Never Used  . Tobacco comment: smoking cessation materials not required  Substance Use Topics  . Alcohol use: Not Currently    Alcohol/week: 0.0 standard drinks  . Drug use: No    Review of Systems Constitutional: No fever/chills Eyes: No visual changes. ENT: No sore throat. No stiff neck no neck pain Cardiovascular: Denies chest pain. Respiratory: Denies shortness of breath. Gastrointestinal:   no vomiting.  No diarrhea.  No constipation. Genitourinary: Negative for dysuria. Musculoskeletal: Negative lower extremity swelling Skin: Negative for rash. Neurological: Negative for severe headaches, focal weakness or numbness.   ____________________________________________   PHYSICAL EXAM:  VITAL SIGNS: ED Triage Vitals  Enc Vitals Group     BP 06/29/18 2040 (!) 168/96     Pulse Rate 06/29/18 2040 (!) 101     Resp 06/29/18 2040 20     Temp 06/29/18 2040 99.5 F (37.5 C)     Temp Source 06/29/18 2040 Oral     SpO2 06/29/18 2040 100 %     Weight 06/29/18 2041 170 lb 13.7 oz (77.5 kg)     Height  06/29/18 2041 5\' 2"  (1.575 m)     Head Circumference --      Peak Flow --      Pain Score --      Pain Loc --      Pain Edu? --      Excl. in  GC? --     Constitutional: Alert and oriented. Well appearing and in no acute distress. Eyes: Conjunctivae are normal Head: Atraumatic HEENT: No congestion/rhinnorhea. Mucous membranes are moist.  Oropharynx non-erythematous Neck:   Nontender with no meningismus, no masses, no stridor Cardiovascular: Normal rate, regular rhythm. Grossly normal heart sounds.  Good peripheral circulation. Respiratory: Normal respiratory effort.  No retractions. Lungs CTAB.  No cough appreciated Abdominal: Soft and nontender. No distention. No guarding no rebound Back:  There is no focal tenderness or step off.  there is no midline tenderness there are no lesions noted. there is no CVA tenderness Musculoskeletal: No lower extremity tenderness, no upper extremity tenderness. No joint effusions, no DVT signs strong distal pulses no edema Neurologic:  Normal speech and language. No gross focal neurologic deficits are appreciated.  Skin:  Skin is warm, dry and intact. No rash noted. Psychiatric: Mood and affect are normal. Speech and behavior are normal.  ____________________________________________   LABS (all labs ordered are listed, but only abnormal results are displayed)  Labs Reviewed  CULTURE, BLOOD (ROUTINE X 2)  CULTURE, BLOOD (ROUTINE X 2)  URINE CULTURE  SARS CORONAVIRUS 2 (HOSPITAL ORDER, [facility])  COMPREHENSIVE METABOLIC PANEL  LACTIC ACID, PLASMA  LACTIC ACID, PLASMA  CBC WITH DIFFERENTIAL/PLATELET  PROTIME-INR  URINALYSIS, COMPLETE (UACMP) WITH MICROSCOPIC    Pertinent labs  results that were available during my care of the patient were reviewed by me and considered in my medical decision making (see chart for details). ____________________________________________  EKG  I personally interpreted any EKGs ordered  by me or triage Sinus tach rate 100 no acute ST elevation or pressure normal axis unremarkable EKG ____________________________________________  RADIOLOGY  Pertinent labs & imaging results that were available during my care of the patient were reviewed by me and considered in my medical decision making (see chart for details). If possible, patient and/or family made aware of any abnormal findings.  No results found. ____________________________________________    PROCEDURES  Procedure(s) performed: None  Procedures  Critical Care performed: None  ____________________________________________   INITIAL IMPRESSION / ASSESSMENT AND PLAN / ED COURSE  Pertinent labs & imaging results that were available during my care of the patient were reviewed by me and considered in my medical decision making (see chart for details).  With chemotherapy, active, with fever and cough here.  We will check to see if she is neutropenic I will send a coronavirus test on her which now hopefully can come back in 2 hours, will obtain blood cultures because she is immunocompromised, she did have antipyretics coming in and is afebrile here which is reassuring certainly does not appear to be septic but we will send a lactic acid we will give her some IV fluid and we will reassess.  ----------------------------------------- 10:25 PM on 06/29/2018 -----------------------------------------  Patient is in no acute distress, her work-up is unremarkable thus for and reassuring.  No evidence of sepsis and she is not febrile here.  I talked to Dr. Rogue Bussing, who is on-call for her oncologist.  He knows her.  I appreciate the consult.  He feels that if patient remains stable giving an IV dose of Levaquin and sending her home with Levaquin would be the right course of action for her.  We did send coronavirus testing which should be back in real-time.  Obviously if that is positive it would change our thinking in the  meantime I am giving her a single dose of Levaquin.  Fever can be caused by the chemotherapeutic agents which the patient is currently taking.  In any event we will err on the side of safety give broad-spectrum antibiotics and he did request Levaquin which we will give her.  ----------------------------------------- 10:42 PM on 06/29/2018 -----------------------------------------  No acute distress sats are 31, she states she is "antsy" she want something to calm her nerves while she waits for the coronavirus test to come back.  We can give her something.  She does not have any significant wheeze, but she thinks steroids might help her breathing and her cough.  I have not really noticed much of a cough here but I do not see any significant counterindication to that we will give her a small dose of steroids here.  She states she was wheezing earlier and this always helps her get over bronchitis.  And that she apparently has some history of reactive airway-like symptoms we will give her a albuterol treatment and a dose of prednisone and we will continue to wait for disposition based upon the coronavirus test  ----------------------------------------- 11:17 PM on 06/29/2018 -----------------------------------------  Signed out to dr. Jacqualine Code at the end of my shift.   ____________________________________________   FINAL CLINICAL IMPRESSION(S) / ED DIAGNOSES  Final diagnoses:  None      This chart was dictated using voice recognition software.  Despite best efforts to proofread,  errors can occur which can change meaning.      Schuyler Amor, MD 06/29/18 2111    Schuyler Amor, MD 06/29/18 2227    Schuyler Amor, MD 06/29/18 0630    Schuyler Amor, MD 06/29/18 2317

## 2018-06-29 NOTE — Progress Notes (Signed)
Patient here today for follow up and treatment regarding ovarian cancer. Patient reports cough, shortness of breath and wheezing. Patient also reports worsening back pain, has taken percocet and is still at 6/10 this morning.

## 2018-06-29 NOTE — Progress Notes (Signed)
AST: 113. Blood pressure elevated, most recent blood pressure reading: 176/93. MD, Dr. Grayland Ormond, notified and aware. Per MD order: patient to receive pain medication (Dilaudid 1mg  IV once) first, see MAR, and then proceed with scheduled Gemzar treatment.  1126- Blood Pressure: 170/94 post-Dilaudid. MD, Dr. Grayland Ormond, notified and aware. Per MD order: proceed with Gemzar at this time. Patient instructed to follow up with primary care physician regarding elevated blood pressure. Patient verbalized understanding.

## 2018-06-30 LAB — CA 125: Cancer Antigen (CA) 125: 69 U/mL — ABNORMAL HIGH (ref 0.0–38.1)

## 2018-06-30 MED ORDER — PREDNISONE 20 MG PO TABS: 40.0000 mg | ORAL_TABLET | Freq: Every day | ORAL | 0 refills | Status: DC

## 2018-06-30 MED ORDER — HEPARIN SOD (PORK) LOCK FLUSH 100 UNIT/ML IV SOLN
500.0000 [IU] | Freq: Once | INTRAVENOUS | Status: AC
  Administered 2018-06-30: 01:00:00 500 [IU] via INTRAVENOUS

## 2018-06-30 MED ORDER — HEPARIN SOD (PORK) LOCK FLUSH 100 UNIT/ML IV SOLN
INTRAVENOUS | Status: AC
  Filled 2018-06-30: qty 5

## 2018-06-30 MED ORDER — LEVOFLOXACIN 500 MG PO TABS: 500.0000 mg | ORAL_TABLET | Freq: Every day | ORAL | 0 refills | Status: AC

## 2018-06-30 NOTE — ED Provider Notes (Signed)
Patient completed Levaquin.  Reassessed, she is resting comfortably and reports her breathing is improved.  Respirating clearly without wheezing.  She is in no distress and her coronavirus test is negative.  She reports she feels much better, she would like to be able to go home and will call oncology this morning for a follow-up appointment, Dr. Burlene Arnt related to Dr. B of oncology would be able to see her in follow-up today.  Careful return precautions discussed.  She appears appropriate for ongoing outpatient care at this time.  Return precautions and treatment recommendations and follow-up discussed with the patient who is agreeable with the plan.    Delman Kitten, MD 06/30/18 [phone]

## 2018-06-30 NOTE — Discharge Instructions (Signed)
Call Oncology this morning for follow-up appointment later today.

## 2018-07-01 LAB — URINE CULTURE: Culture: NO GROWTH

## 2018-07-04 LAB — CULTURE, BLOOD (ROUTINE X 2)
Culture: NO GROWTH
Culture: NO GROWTH
Special Requests: ADEQUATE
Special Requests: ADEQUATE

## 2018-07-06 ENCOUNTER — Inpatient Hospital Stay: Payer: PPO

## 2018-07-06 ENCOUNTER — Other Ambulatory Visit: Payer: Self-pay

## 2018-07-06 ENCOUNTER — Inpatient Hospital Stay (HOSPITAL_BASED_OUTPATIENT_CLINIC_OR_DEPARTMENT_OTHER): Payer: PPO | Admitting: Oncology

## 2018-07-06 ENCOUNTER — Encounter: Payer: Self-pay | Admitting: Oncology

## 2018-07-06 DIAGNOSIS — D649 Anemia, unspecified: Secondary | ICD-10-CM

## 2018-07-06 DIAGNOSIS — G629 Polyneuropathy, unspecified: Secondary | ICD-10-CM

## 2018-07-06 DIAGNOSIS — R0602 Shortness of breath: Secondary | ICD-10-CM

## 2018-07-06 DIAGNOSIS — Z5111 Encounter for antineoplastic chemotherapy: Secondary | ICD-10-CM | POA: Diagnosis not present

## 2018-07-06 DIAGNOSIS — N289 Disorder of kidney and ureter, unspecified: Secondary | ICD-10-CM

## 2018-07-06 DIAGNOSIS — Z87891 Personal history of nicotine dependence: Secondary | ICD-10-CM | POA: Diagnosis not present

## 2018-07-06 DIAGNOSIS — C569 Malignant neoplasm of unspecified ovary: Secondary | ICD-10-CM

## 2018-07-06 LAB — COMPREHENSIVE METABOLIC PANEL
ALT: 50 U/L — ABNORMAL HIGH (ref 0–44)
AST: 50 U/L — ABNORMAL HIGH (ref 15–41)
Albumin: 3 g/dL — ABNORMAL LOW (ref 3.5–5.0)
Alkaline Phosphatase: 66 U/L (ref 38–126)
Anion gap: 8 (ref 5–15)
BUN: 45 mg/dL — ABNORMAL HIGH (ref 8–23)
CO2: 26 mmol/L (ref 22–32)
Calcium: 8.4 mg/dL — ABNORMAL LOW (ref 8.9–10.3)
Chloride: 104 mmol/L (ref 98–111)
Creatinine, Ser: 1.62 mg/dL — ABNORMAL HIGH (ref 0.44–1.00)
GFR calc Af Amer: 39 mL/min — ABNORMAL LOW (ref >60)
GFR calc non Af Amer: 33 mL/min — ABNORMAL LOW (ref >60)
Glucose, Bld: 147 mg/dL — ABNORMAL HIGH (ref 70–99)
Potassium: 3.9 mmol/L (ref 3.5–5.1)
Sodium: 138 mmol/L (ref 135–145)
Total Bilirubin: 0.9 mg/dL (ref 0.3–1.2)
Total Protein: 5.8 g/dL — ABNORMAL LOW (ref 6.5–8.1)

## 2018-07-06 LAB — CBC WITH DIFFERENTIAL/PLATELET
Abs Immature Granulocytes: 4.65 10*3/uL — ABNORMAL HIGH (ref 0.00–0.07)
Basophils Absolute: 0 10*3/uL (ref 0.0–0.1)
Basophils Relative: 0 %
Eosinophils Absolute: 0.1 10*3/uL (ref 0.0–0.5)
Eosinophils Relative: 0 %
HCT: 18.1 % — ABNORMAL LOW (ref 36.0–46.0)
Hemoglobin: 6.2 g/dL — ABNORMAL LOW (ref 12.0–15.0)
Immature Granulocytes: 23 %
Lymphocytes Relative: 11 %
Lymphs Abs: 2.1 10*3/uL (ref 0.7–4.0)
MCH: 37.1 pg — ABNORMAL HIGH (ref 26.0–34.0)
MCHC: 34.3 g/dL (ref 30.0–36.0)
MCV: 108.4 fL — ABNORMAL HIGH (ref 80.0–100.0)
Monocytes Absolute: 2 10*3/uL — ABNORMAL HIGH (ref 0.1–1.0)
Monocytes Relative: 10 %
Neutro Abs: 11.1 10*3/uL — ABNORMAL HIGH (ref 1.7–7.7)
Neutrophils Relative %: 56 %
Platelets: 15 10*3/uL — CL (ref 150–400)
RBC: 1.67 MIL/uL — ABNORMAL LOW (ref 3.87–5.11)
RDW: 17.3 % — ABNORMAL HIGH (ref 11.5–15.5)
Smear Review: NORMAL
WBC: 20 10*3/uL — ABNORMAL HIGH (ref 4.0–10.5)
nRBC: 6.7 % — ABNORMAL HIGH (ref 0.0–0.2)

## 2018-07-06 LAB — PREPARE RBC (CROSSMATCH)

## 2018-07-06 MED ORDER — ACETAMINOPHEN 325 MG PO TABS
650.0000 mg | ORAL_TABLET | Freq: Once | ORAL | Status: AC
  Administered 2018-07-06: 650 mg via ORAL
  Filled 2018-07-06: qty 2

## 2018-07-06 MED ORDER — LEVOFLOXACIN 500 MG PO TABS: 500.0000 mg | ORAL_TABLET | Freq: Every day | ORAL | 0 refills | Status: DC

## 2018-07-06 MED ORDER — SODIUM CHLORIDE 0.9% IV SOLUTION
250.0000 mL | Freq: Once | INTRAVENOUS | Status: AC
  Administered 2018-07-06: 250 mL via INTRAVENOUS
  Filled 2018-07-06: qty 250

## 2018-07-06 MED ORDER — HEPARIN SOD (PORK) LOCK FLUSH 100 UNIT/ML IV SOLN
500.0000 [IU] | Freq: Once | INTRAVENOUS | Status: AC
  Administered 2018-07-06: 500 [IU] via INTRAVENOUS
  Filled 2018-07-06: qty 5

## 2018-07-06 MED ORDER — SODIUM CHLORIDE 0.9% FLUSH
10.0000 mL | INTRAVENOUS | Status: DC | PRN
  Filled 2018-07-06: qty 10

## 2018-07-06 MED ORDER — DIPHENHYDRAMINE HCL 50 MG/ML IJ SOLN
25.0000 mg | Freq: Once | INTRAMUSCULAR | Status: AC
  Administered 2018-07-06: 25 mg via INTRAVENOUS
  Filled 2018-07-06: qty 1

## 2018-07-06 NOTE — Progress Notes (Signed)
Patient reports continued shortness of breath.  

## 2018-07-06 NOTE — Progress Notes (Signed)
[place]  [phone] [phone]  ID: Jessica Burgess OB: October 25, 1955  MR#: [id number]  IWP#:[id number]  Patient Care Team: Steele Sizer, MD as PCP - General (Family Medicine) Lloyd Huger, MD as Consulting Physician (Oncology) Mellody Drown, MD as Consulting Physician (Obstetrics and Gynecology) Cathi Roan, [facility] (Pharmacist) Benedetto Goad, RN as Case Manager Clent Jacks, RN as Registered Nurse  CHIEF COMPLAINT: Stage IIIc high-grade serous ovarian carcinoma  INTERVAL HISTORY: Patient returns to clinic today for further evaluation and consideration of cycle 5, day 15 of single agent gemcitabine.  She was evaluated in the ER last week fever of 102.2 and increasing shortness of breath.  Patient tested COVID-19 negative and was subsequently placed on 7 days of prednisone and Levaquin.  She continues to have significant dyspnea on exertion and weakness and fatigue.  She denies any further fevers. She continues to have a mild peripheral neuropathy that is chronic and unchanged. She has no other neurologic complaints. She has good appetite and denies weight loss.  She denies any chest pain or hemoptysis.  She denies any nausea, vomiting, constipation, or diarrhea.  She denies any abdominal pain or bloating.  She has no urinary complaints.  Patient offers no further specific complaints today.  REVIEW OF SYSTEMS:   Review of Systems  Constitutional: Positive for malaise/fatigue. Negative for fever and weight loss.  HENT: Negative.  Negative for sore throat.   Eyes: Negative.  Negative for blurred vision.  Respiratory: Positive for shortness of breath. Negative for cough.   Cardiovascular: Negative.  Negative for chest pain and leg swelling.  Gastrointestinal: Negative.  Negative for abdominal pain, blood in stool, constipation, diarrhea, melena, nausea and vomiting.  Genitourinary: Negative.  Negative for dysuria.   Musculoskeletal: Positive for back pain and joint pain. Negative for neck pain.  Skin: Negative.  Negative for itching and rash.  Neurological: Positive for tingling, sensory change and weakness. Negative for focal weakness.  Psychiatric/Behavioral: Negative.  The patient is not nervous/anxious.     As per HPI. Otherwise, a complete review of systems is negative.  PAST MEDICAL HISTORY: Past Medical History:  Diagnosis Date  . Allergic rhinitis, cause unspecified   . Anxiety state, unspecified   . Arthritis   . Asthma    only when sick   . Backache, unspecified   . Bronchitis    hx of when get sick  . Cancer ([place])    skin cancer , basal cell   . Cancer ([place]) 11/2016   ovarian  . Cervicalgia   . Complication of anesthesia   . Dermatophytosis of nail   . Dysmetabolic syndrome X   . Encounter for long-term (current) use of other medications   . Esophageal reflux   . Insomnia, unspecified   . Leukocytosis, unspecified   . Migraine without aura, without mention of intractable migraine without mention of status migrainosus   . Other and unspecified hyperlipidemia   . Other malaise and fatigue   . Overweight(278.02)   . Personal history of chemotherapy now   ovarian  . PONV (postoperative nausea and vomiting)   . Spinal stenosis in cervical region   . Symptomatic menopausal or female climacteric states   . Unspecified disorder of skin and subcutaneous tissue   . Unspecified vitamin D deficiency     PAST SURGICAL HISTORY: Past Surgical History:  Procedure Laterality Date  . ABDOMINAL HYSTERECTOMY    . ANTERIOR CERVICAL DECOMP/DISCECTOMY FUSION N/A 06/07/2015   Procedure:  Cervical three - four and Cervical six- seven anterior cervical decompression with fusion interbody prosthesis plating and bonegraft;  Surgeon: Newman Pies, MD;  Location: [place] [facility];  Service: Neurosurgery;  Laterality: N/A;  C34 and C67 anterior cervical decompression with fusion interbody prosthesis  plating and bonegraft  . BACK SURGERY     x2 Lower   . EVACUATION OF CERVICAL HEMATOMA N/A 06/14/2015   Procedure: EVACUATION OF CERVICAL HEMATOMA;  Surgeon: Newman Pies, MD;  Location: [place] [facility];  Service: Neurosurgery;  Laterality: N/A;  . NECK SURGERY     x3  . TONSILLECTOMY      FAMILY HISTORY: Family History  Problem Relation Age of Onset  . Depression Mother   . Migraines Mother   . Dementia Father   . Diabetes Father   . Hyperlipidemia Father   . Hyperlipidemia Brother   . Hyperlipidemia Brother   . Breast cancer Paternal Aunt        27s    ADVANCED DIRECTIVES (Y/N):  N  HEALTH MAINTENANCE: Social History   Tobacco Use  . Smoking status: Former Smoker    Packs/day: 1.50    Years: 20.00    Pack years: 30.00    Types: Cigarettes    Start date: 03/19/1979    Last attempt to quit: 08/25/1999    Years since quitting: 18.8  . Smokeless tobacco: Never Used  . Tobacco comment: smoking cessation materials not required  Substance Use Topics  . Alcohol use: Not Currently    Alcohol/week: 0.0 standard drinks  . Drug use: No     Colonoscopy:  PAP:  Bone density:  Lipid panel:  No Known Allergies  Current Outpatient Medications  Medication Sig Dispense Refill  . albuterol (PROVENTIL HFA;VENTOLIN HFA) 108 (90 Base) MCG/ACT inhaler Inhale 2 puffs into the lungs every 6 (six) hours as needed for wheezing or shortness of breath. 1 Inhaler 2  . albuterol (PROVENTIL) (2.5 MG/3ML) 0.083% nebulizer solution Take 3 mLs (2.5 mg total) by nebulization every 6 (six) hours as needed for wheezing or shortness of breath. 75 mL 2  . Ascorbic Acid (VITAMIN C PO) Take 1 tablet by mouth daily.     . budesonide-formoterol (SYMBICORT) 160-4.5 MCG/ACT inhaler Inhale 2 puffs into the lungs 2 (two) times daily.    . Cholecalciferol (VITAMIN D-1000 MAX ST) 1000 UNITS tablet Take 1 tablet by mouth 2 (two) times daily.    . Cyanocobalamin (B-12 PO) Take 1 tablet by mouth daily.     .  cyclobenzaprine (FLEXERIL) 10 MG tablet Take one tablet every 8 hours as needed 30 tablet 1  . DULoxetine (CYMBALTA) 60 MG capsule TAKE 1 CAPSULE(60 MG) BY MOUTH DAILY 180 capsule 1  . estradiol (ESTRACE) 0.5 MG tablet Take 1 tablet (0.5 mg total) by mouth daily. 90 tablet 4  . ferrous sulfate (IRON SUPPLEMENT) 325 (65 FE) MG tablet Take 1 tablet by mouth daily.    . fluticasone (FLONASE) 50 MCG/ACT nasal spray Place 2 sprays into both nostrils as needed. 48 g 1  . gabapentin (NEURONTIN) 300 MG capsule Take 1-3 capsules (300-900 mg total) by mouth 3 (three) times daily. 300 mg twice a day and 900 mg at night 540 capsule 1  . ipratropium (ATROVENT HFA) 17 MCG/ACT inhaler Inhale 2 puffs into the lungs every 6 (six) hours as needed.     . lidocaine (XYLOCAINE) 2 % solution Use as directed 15 mLs in the mouth or throat as needed for mouth pain. 100  mL 0  . Lysine 1000 MG TABS Take 1 tablet by mouth daily.     . Magnesium Oxide 500 MG CAPS Take 500 mg by mouth 2 (two) times daily.     . Multiple Vitamins-Minerals (MULTIVITAMIN PO) Take 1 tablet by mouth daily.     . Omega-3 Fatty Acids (FISH OIL PO) Take 1 capsule by mouth 2 (two) times daily.     . ondansetron (ZOFRAN-ODT) 8 MG disintegrating tablet Take 1 tablet (8 mg total) by mouth every 8 (eight) hours as needed for nausea or vomiting. 60 tablet 0  . oxyCODONE-acetaminophen (PERCOCET) 10-325 MG tablet Take 1 tablet by mouth every 6 (six) hours as needed for pain. 120 tablet 0  . potassium gluconate 595 MG TABS tablet Take 595 mg by mouth daily.    . predniSONE (DELTASONE) 20 MG tablet Take 2 tablets (40 mg total) by mouth daily with breakfast. 8 tablet 0  . SUMAtriptan (IMITREX) 100 MG tablet May repeat in 2 hours if headache persists or recurs. 9 tablet 0  . temazepam (RESTORIL) 15 MG capsule Take 1 capsule (15 mg total) by mouth at bedtime as needed for sleep. 30 capsule 0  . triamcinolone (KENALOG) 0.1 % paste Use as directed 1 application in  the mouth or throat 2 (two) times daily. 5 g 2  . urea (CARMOL) 10 % cream Apply topically as needed. 410 g 1  . levofloxacin (LEVAQUIN) 500 MG tablet Take 1 tablet (500 mg total) by mouth daily. 7 tablet 0  . valACYclovir (VALTREX) 1000 MG tablet TAKE 1 TABLET(1000 MG) BY MOUTH THREE TIMES DAILY FOR 7 DAYS (Patient not taking: Reported on 06/29/2018) 21 tablet 0   No current facility-administered medications for this visit.    Facility-Administered Medications Ordered in Other Visits  Medication Dose Route Frequency Provider Last Rate Last Dose  . 0.9 %  sodium chloride infusion   Intravenous Once Lloyd Huger, MD      . 0.9 %  sodium chloride infusion   Intravenous Once Lloyd Huger, MD      . dexamethasone (DECADRON) 20 mg in sodium chloride 0.9 % 50 mL IVPB  20 mg Intravenous Once Lloyd Huger, MD      . dexamethasone (DECADRON) injection 10 mg  10 mg Intravenous Once Lloyd Huger, MD      . heparin lock flush 100 unit/mL  500 Units Intravenous Once Lloyd Huger, MD      . sodium chloride flush (NS) 0.9 % injection 10 mL  10 mL Intravenous PRN Lloyd Huger, MD   10 mL at 06/16/17 0854  . sodium chloride flush (NS) 0.9 % injection 10 mL  10 mL Intravenous PRN Lloyd Huger, MD        OBJECTIVE: There were no vitals filed for this visit.   There is no height or weight on file to calculate BMI.    ECOG FS:0 - Asymptomatic  General: Well-developed, well-nourished, no acute distress. Eyes: Pink conjunctiva, anicteric sclera. HEENT: Normocephalic, moist mucous membranes. Lungs: Clear to auscultation bilaterally. Heart: Regular rate and rhythm. No rubs, murmurs, or gallops. Abdomen: Soft, nontender, nondistended. No organomegaly noted, normoactive bowel sounds. Musculoskeletal: No edema, cyanosis, or clubbing. Neuro: Alert, answering all questions appropriately. Cranial nerves grossly intact. Skin: No rashes or petechiae noted. Psych: Normal  affect.  LAB RESULTS:  Lab Results  Component Value Date   NA 138 07/06/2018   K 3.9 07/06/2018   CL 104  07/06/2018   CO2 26 07/06/2018   GLUCOSE 147 (H) 07/06/2018   BUN 45 (H) 07/06/2018   CREATININE 1.62 (H) 07/06/2018   CALCIUM 8.4 (L) 07/06/2018   PROT 5.8 (L) 07/06/2018   ALBUMIN 3.0 (L) 07/06/2018   AST 50 (H) 07/06/2018   ALT 50 (H) 07/06/2018   ALKPHOS 66 07/06/2018   BILITOT 0.9 07/06/2018   GFRNONAA 33 (L) 07/06/2018   GFRAA 39 (L) 07/06/2018    Lab Results  Component Value Date   WBC 20.0 (H) 07/06/2018   NEUTROABS 11.1 (H) 07/06/2018   HGB 6.2 (L) 07/06/2018   HCT 18.1 (L) 07/06/2018   MCV 108.4 (H) 07/06/2018   PLT 15 (LL) 07/06/2018     STUDIES: Dg Chest Port 1 View  Result Date: 06/29/2018 CLINICAL DATA:  Shortness of breath and productive cough, history of ovarian carcinoma, initial encounter EXAM: PORTABLE CHEST 1 VIEW COMPARISON:  05/07/2018 FINDINGS: Cardiac shadows within normal limits. Right-sided chest wall port is noted. Lungs are well aerated bilaterally with diffuse increased interstitial changes noted bilaterally. Mild central vascular congestion is noted. No focal infiltrate is seen. No acute bony abnormality is noted. Postsurgical changes are noted in the cervical spine. IMPRESSION: Increased interstitial changes likely related to pulmonary edema. No focal infiltrate is noted. Electronically Signed   By: Inez Catalina M.D.   On: 06/29/2018 21:47    ONCOLOGY HISTORY: Patient underwent debulking surgery at [facility] on November 19, 2016. She received one infusion preoperative infusion of Keytruda on November 11, 2016 as part of a clinical trial.  She initiated adjuvant carboplatinum and Taxol on December 16, 2016.  This was continued through April 28, 2017 at which time Taxol was discontinued secondary to worsening neuropathy.  Patient then received single agent carboplatinum for additional 2 infusions.  She was then noted to have progression of  disease and was switched to Avastin and doxorubicin on August 18, 2017 this was discontinued on November 24, 2017 secondary to declining performance status.  Palliative gemcitabine was initiated on February 20, 2018.   ASSESSMENT: Stage IIIc high-grade serous ovarian carcinoma  PLAN:  1. Stage IIIc high-grade serous ovarian carcinoma: See oncology history as above.  PET scan results from May 07, 2018 reviewed independently with significant improvement in disease burden.  Patient CA-125 appears to be erratic ranging from 40s to 60s. Patient has inquired about intraperitoneal chemotherapy, but the benefit is unclear with increased toxicity.  Given patient's profound anemia and thrombocytopenia, will delay treatment today.  Return to clinic in 1 week for further evaluation and reconsideration of cycle 5, day 15.     2. Pain: Well controlled.  Continue oxycodone as needed.   3. Anxiety: Chronic and unchanged.  Continue Xanax as prescribed. 4.  Peripheral neuropathy: Chronic and unchanged.  Continue gabapentin as prescribed.  Patient has agreed to acupuncture.   5.  Anemia: Patient hemoglobin significantly decreased and she received 2 units of packed red blood cells today. 6.  Shoulder pain: Patient does not complain of this today.  Continue cortisone injections as needed.  Patient is also indicated she may need rotator cuff surgery in the future.  Okay to proceed from an oncology standpoint, but will have to be coordinated with chemotherapy. 7.  Thrombocytopenia: Patient's platelet count is 15.  Delay treatment as above. 8.  Leukocytosis: Reactive.  Patient was given an additional 7 days worth of Levaquin. 9.  Elevated liver enzymes: AST and ALT have trended down.  Monitor. 10.  Tooth  pain: Patient does not complain of this today.  Continue follow-up with dentist as scheduled. 11.  Renal insufficiency: Creatinine slightly increased.  2 units packed red blood cells as above. 12.  Shortness of breath:  Likely secondary to profound anemia.  Proceed with infusion as above.   Patient expressed understanding and was in agreement with this plan. She also understands that She can call clinic at any time with any questions, concerns, or complaints.   Cancer Staging Ovarian cancer, unspecified laterality ([place]) Staging form: Ovary, Fallopian Tube, and Primary Peritoneal Carcinoma, AJCC 8th Edition - Clinical: No stage assigned - Unsigned   Lloyd Huger, MD   07/06/2018 1:57 PM

## 2018-07-07 LAB — BPAM RBC
Blood Product Expiration Date: 202004212359
Blood Product Expiration Date: 202004232359
ISSUE DATE / TIME: 202004201326
ISSUE DATE / TIME: 202004201520
Unit Type and Rh: 7300
Unit Type and Rh: 7300

## 2018-07-07 LAB — TYPE AND SCREEN
ABO/RH(D): B POS
Antibody Screen: NEGATIVE
Unit division: 0
Unit division: 0

## 2018-07-11 ENCOUNTER — Other Ambulatory Visit: Payer: Self-pay | Admitting: Family Medicine

## 2018-07-11 DIAGNOSIS — G4700 Insomnia, unspecified: Secondary | ICD-10-CM

## 2018-07-13 ENCOUNTER — Other Ambulatory Visit: Payer: Self-pay

## 2018-07-13 ENCOUNTER — Inpatient Hospital Stay: Payer: PPO

## 2018-07-13 ENCOUNTER — Encounter: Payer: Self-pay | Admitting: Oncology

## 2018-07-13 ENCOUNTER — Inpatient Hospital Stay (HOSPITAL_BASED_OUTPATIENT_CLINIC_OR_DEPARTMENT_OTHER): Payer: PPO | Admitting: Oncology

## 2018-07-13 VITALS — BP 153/83 | HR 73 | Resp 18 | Wt 170.0 lb

## 2018-07-13 VITALS — Temp 96.4°F

## 2018-07-13 DIAGNOSIS — F419 Anxiety disorder, unspecified: Secondary | ICD-10-CM | POA: Diagnosis not present

## 2018-07-13 DIAGNOSIS — C569 Malignant neoplasm of unspecified ovary: Secondary | ICD-10-CM | POA: Diagnosis not present

## 2018-07-13 DIAGNOSIS — G629 Polyneuropathy, unspecified: Secondary | ICD-10-CM | POA: Diagnosis not present

## 2018-07-13 DIAGNOSIS — Z87891 Personal history of nicotine dependence: Secondary | ICD-10-CM | POA: Diagnosis not present

## 2018-07-13 DIAGNOSIS — Z5111 Encounter for antineoplastic chemotherapy: Secondary | ICD-10-CM | POA: Diagnosis not present

## 2018-07-13 DIAGNOSIS — Z95828 Presence of other vascular implants and grafts: Secondary | ICD-10-CM

## 2018-07-13 LAB — CBC WITH DIFFERENTIAL/PLATELET
Abs Immature Granulocytes: 0.13 10*3/uL — ABNORMAL HIGH (ref 0.00–0.07)
Basophils Absolute: 0 10*3/uL (ref 0.0–0.1)
Basophils Relative: 1 %
Eosinophils Absolute: 0.1 10*3/uL (ref 0.0–0.5)
Eosinophils Relative: 2 %
HCT: 24.3 % — ABNORMAL LOW (ref 36.0–46.0)
Hemoglobin: 8 g/dL — ABNORMAL LOW (ref 12.0–15.0)
Immature Granulocytes: 3 %
Lymphocytes Relative: 24 %
Lymphs Abs: 1.1 10*3/uL (ref 0.7–4.0)
MCH: 36 pg — ABNORMAL HIGH (ref 26.0–34.0)
MCHC: 32.9 g/dL (ref 30.0–36.0)
MCV: 109.5 fL — ABNORMAL HIGH (ref 80.0–100.0)
Monocytes Absolute: 0.9 10*3/uL (ref 0.1–1.0)
Monocytes Relative: 19 %
Neutro Abs: 2.3 10*3/uL (ref 1.7–7.7)
Neutrophils Relative %: 51 %
Platelets: 111 10*3/uL — ABNORMAL LOW (ref 150–400)
RBC: 2.22 MIL/uL — ABNORMAL LOW (ref 3.87–5.11)
RDW: 22.3 % — ABNORMAL HIGH (ref 11.5–15.5)
WBC: 4.5 10*3/uL (ref 4.0–10.5)
nRBC: 0 % (ref 0.0–0.2)

## 2018-07-13 LAB — COMPREHENSIVE METABOLIC PANEL
ALT: 19 U/L (ref 0–44)
AST: 27 U/L (ref 15–41)
Albumin: 3.3 g/dL — ABNORMAL LOW (ref 3.5–5.0)
Alkaline Phosphatase: 65 U/L (ref 38–126)
Anion gap: 8 (ref 5–15)
BUN: 50 mg/dL — ABNORMAL HIGH (ref 8–23)
CO2: 26 mmol/L (ref 22–32)
Calcium: 8.5 mg/dL — ABNORMAL LOW (ref 8.9–10.3)
Chloride: 105 mmol/L (ref 98–111)
Creatinine, Ser: 2.05 mg/dL — ABNORMAL HIGH (ref 0.44–1.00)
GFR calc Af Amer: 29 mL/min — ABNORMAL LOW (ref >60)
GFR calc non Af Amer: 25 mL/min — ABNORMAL LOW (ref >60)
Glucose, Bld: 87 mg/dL (ref 70–99)
Potassium: 5 mmol/L (ref 3.5–5.1)
Sodium: 139 mmol/L (ref 135–145)
Total Bilirubin: 0.6 mg/dL (ref 0.3–1.2)
Total Protein: 6.2 g/dL — ABNORMAL LOW (ref 6.5–8.1)

## 2018-07-13 MED ORDER — SODIUM CHLORIDE 0.9 % IV SOLN
Freq: Once | INTRAVENOUS | Status: AC
  Administered 2018-07-13: 12:00:00 1000 mL via INTRAVENOUS
  Filled 2018-07-13: qty 250

## 2018-07-13 MED ORDER — SODIUM CHLORIDE 0.9 % IV SOLN
Freq: Once | INTRAVENOUS | Status: AC
  Administered 2018-07-13: 12:00:00 via INTRAVENOUS
  Filled 2018-07-13: qty 250

## 2018-07-13 MED ORDER — SODIUM CHLORIDE 0.9% FLUSH
10.0000 mL | Freq: Once | INTRAVENOUS | Status: AC
  Administered 2018-07-13: 10 mL via INTRAVENOUS
  Filled 2018-07-13: qty 10

## 2018-07-13 MED ORDER — SODIUM CHLORIDE 0.9 % IV SOLN
1600.0000 mg | Freq: Once | INTRAVENOUS | Status: AC
  Administered 2018-07-13: 13:00:00 1600 mg via INTRAVENOUS
  Filled 2018-07-13: qty 15.78

## 2018-07-13 MED ORDER — HEPARIN SOD (PORK) LOCK FLUSH 100 UNIT/ML IV SOLN
500.0000 [IU] | Freq: Once | INTRAVENOUS | Status: AC | PRN
  Administered 2018-07-13: 13:00:00 500 [IU]
  Filled 2018-07-13: qty 5

## 2018-07-13 MED ORDER — PROCHLORPERAZINE MALEATE 10 MG PO TABS
10.0000 mg | ORAL_TABLET | Freq: Once | ORAL | Status: AC
  Administered 2018-07-13: 12:00:00 10 mg via ORAL
  Filled 2018-07-13: qty 1

## 2018-07-13 NOTE — Progress Notes (Signed)
Patient denies any concerns today.  

## 2018-07-13 NOTE — Progress Notes (Signed)
[place]  [phone] [phone]) [phone]  ID: Jessica Burgess OB: Oct 04, 1955  MR#: [id number]  QBH#:[id number]  Patient Care Team: Steele Sizer, MD as PCP - General (Family Medicine) Lloyd Huger, MD as Consulting Physician (Oncology) Mellody Drown, MD as Consulting Physician (Obstetrics and Gynecology) Cathi Roan, [facility] (Pharmacist) Benedetto Goad, RN as Case Manager Clent Jacks, RN as Registered Nurse  CHIEF COMPLAINT: Stage IIIc high-grade serous ovarian carcinoma  INTERVAL HISTORY: Patient returns to clinic today for further evaluation and reconsideration of cycle 5, day 15 of single agent gemcitabine.  She currently feels well and is back to her baseline.  She denies any further fevers.  Her weakness and fatigue have also improved.  She does not complain of shortness of breath today.  She continues to have a mild peripheral neuropathy that is chronic and unchanged. She has no other neurologic complaints. She has good appetite and denies weight loss.  She denies any chest pain or hemoptysis.  She denies any nausea, vomiting, constipation, or diarrhea.  She denies any abdominal pain or bloating.  She has no urinary complaints.  Patient offers no specific complaints today.  REVIEW OF SYSTEMS:   Review of Systems  Constitutional: Negative.  Negative for fever, malaise/fatigue and weight loss.  HENT: Negative.  Negative for sore throat.   Eyes: Negative.  Negative for blurred vision.  Respiratory: Negative.  Negative for cough and shortness of breath.   Cardiovascular: Negative.  Negative for chest pain and leg swelling.  Gastrointestinal: Negative.  Negative for abdominal pain, blood in stool, constipation, diarrhea, melena, nausea and vomiting.  Genitourinary: Negative.  Negative for dysuria.  Musculoskeletal: Positive for back pain and joint pain. Negative for neck pain.  Skin: Negative.  Negative for itching and rash.   Neurological: Positive for tingling and sensory change. Negative for focal weakness and weakness.  Psychiatric/Behavioral: Negative.  The patient is not nervous/anxious.     As per HPI. Otherwise, a complete review of systems is negative.  PAST MEDICAL HISTORY: Past Medical History:  Diagnosis Date  . Allergic rhinitis, cause unspecified   . Anxiety state, unspecified   . Arthritis   . Asthma    only when sick   . Backache, unspecified   . Bronchitis    hx of when get sick  . Cancer ([place])    skin cancer , basal cell   . Cancer ([place]) 11/2016   ovarian  . Cervicalgia   . Complication of anesthesia   . Dermatophytosis of nail   . Dysmetabolic syndrome X   . Encounter for long-term (current) use of other medications   . Esophageal reflux   . Insomnia, unspecified   . Leukocytosis, unspecified   . Migraine without aura, without mention of intractable migraine without mention of status migrainosus   . Other and unspecified hyperlipidemia   . Other malaise and fatigue   . Overweight(278.02)   . Personal history of chemotherapy now   ovarian  . PONV (postoperative nausea and vomiting)   . Spinal stenosis in cervical region   . Symptomatic menopausal or female climacteric states   . Unspecified disorder of skin and subcutaneous tissue   . Unspecified vitamin D deficiency     PAST SURGICAL HISTORY: Past Surgical History:  Procedure Laterality Date  . ABDOMINAL HYSTERECTOMY    . ANTERIOR CERVICAL DECOMP/DISCECTOMY FUSION N/A 06/07/2015   Procedure: Cervical three - four and Cervical six- seven anterior cervical decompression with fusion interbody prosthesis  plating and bonegraft;  Surgeon: Newman Pies, MD;  Location: [facility];  Service: Neurosurgery;  Laterality: N/A;  C34 and C67 anterior cervical decompression with fusion interbody prosthesis plating and bonegraft  . BACK SURGERY     x2 Lower   . EVACUATION OF CERVICAL HEMATOMA N/A 06/14/2015   Procedure: EVACUATION  OF CERVICAL HEMATOMA;  Surgeon: Newman Pies, MD;  Location: [facility];  Service: Neurosurgery;  Laterality: N/A;  . NECK SURGERY     x3  . TONSILLECTOMY      FAMILY HISTORY: Family History  Problem Relation Age of Onset  . Depression Mother   . Migraines Mother   . Dementia Father   . Diabetes Father   . Hyperlipidemia Father   . Hyperlipidemia Brother   . Hyperlipidemia Brother   . Breast cancer Paternal Aunt        66s    ADVANCED DIRECTIVES (Y/N):  N  HEALTH MAINTENANCE: Social History   Tobacco Use  . Smoking status: Former Smoker    Packs/day: 1.50    Years: 20.00    Pack years: 30.00    Types: Cigarettes    Start date: 03/19/1979    Last attempt to quit: 08/25/1999    Years since quitting: 18.8  . Smokeless tobacco: Never Used  . Tobacco comment: smoking cessation materials not required  Substance Use Topics  . Alcohol use: Not Currently    Alcohol/week: 0.0 standard drinks  . Drug use: No     Colonoscopy:  PAP:  Bone density:  Lipid panel:  No Known Allergies  Current Outpatient Medications  Medication Sig Dispense Refill  . albuterol (PROVENTIL HFA;VENTOLIN HFA) 108 (90 Base) MCG/ACT inhaler Inhale 2 puffs into the lungs every 6 (six) hours as needed for wheezing or shortness of breath. 1 Inhaler 2  . albuterol (PROVENTIL) (2.5 MG/3ML) 0.083% nebulizer solution Take 3 mLs (2.5 mg total) by nebulization every 6 (six) hours as needed for wheezing or shortness of breath. 75 mL 2  . Ascorbic Acid (VITAMIN C PO) Take 1 tablet by mouth daily.     . budesonide-formoterol (SYMBICORT) 160-4.5 MCG/ACT inhaler Inhale 2 puffs into the lungs 2 (two) times daily.    . Cholecalciferol (VITAMIN D-1000 MAX ST) 1000 UNITS tablet Take 1 tablet by mouth 2 (two) times daily.    . Cyanocobalamin (B-12 PO) Take 1 tablet by mouth daily.     . cyclobenzaprine (FLEXERIL) 10 MG tablet Take one tablet every 8 hours as needed 30 tablet 1  . DULoxetine (CYMBALTA) 60 MG capsule  TAKE 1 CAPSULE(60 MG) BY MOUTH DAILY 180 capsule 1  . estradiol (ESTRACE) 0.5 MG tablet Take 1 tablet (0.5 mg total) by mouth daily. 90 tablet 4  . ferrous sulfate (IRON SUPPLEMENT) 325 (65 FE) MG tablet Take 1 tablet by mouth daily.    . fluticasone (FLONASE) 50 MCG/ACT nasal spray Place 2 sprays into both nostrils as needed. 48 g 1  . gabapentin (NEURONTIN) 300 MG capsule Take 1-3 capsules (300-900 mg total) by mouth 3 (three) times daily. 300 mg twice a day and 900 mg at night 540 capsule 1  . ipratropium (ATROVENT HFA) 17 MCG/ACT inhaler Inhale 2 puffs into the lungs every 6 (six) hours as needed.     . lidocaine (XYLOCAINE) 2 % solution Use as directed 15 mLs in the mouth or throat as needed for mouth pain. 100 mL 0  . Lysine 1000 MG TABS Take 1 tablet by mouth daily.     Marland Kitchen  Magnesium Oxide 500 MG CAPS Take 500 mg by mouth 2 (two) times daily.     . Multiple Vitamins-Minerals (MULTIVITAMIN PO) Take 1 tablet by mouth daily.     . Omega-3 Fatty Acids (FISH OIL PO) Take 1 capsule by mouth 2 (two) times daily.     . ondansetron (ZOFRAN-ODT) 8 MG disintegrating tablet Take 1 tablet (8 mg total) by mouth every 8 (eight) hours as needed for nausea or vomiting. 60 tablet 0  . oxyCODONE-acetaminophen (PERCOCET) 10-325 MG tablet Take 1 tablet by mouth every 6 (six) hours as needed for pain. 120 tablet 0  . potassium gluconate 595 MG TABS tablet Take 595 mg by mouth daily.    . SUMAtriptan (IMITREX) 100 MG tablet May repeat in 2 hours if headache persists or recurs. 9 tablet 0  . triamcinolone (KENALOG) 0.1 % paste Use as directed 1 application in the mouth or throat 2 (two) times daily. 5 g 2  . urea (CARMOL) 10 % cream Apply topically as needed. 410 g 1  . valACYclovir (VALTREX) 1000 MG tablet TAKE 1 TABLET(1000 MG) BY MOUTH THREE TIMES DAILY FOR 7 DAYS 21 tablet 0  . temazepam (RESTORIL) 15 MG capsule TAKE 1 CAPSULE(15 MG) BY MOUTH AT BEDTIME AS NEEDED FOR SLEEP 30 capsule 2   No current  facility-administered medications for this visit.    Facility-Administered Medications Ordered in Other Visits  Medication Dose Route Frequency Provider Last Rate Last Dose  . 0.9 %  sodium chloride infusion   Intravenous Once Lloyd Huger, MD      . 0.9 %  sodium chloride infusion   Intravenous Once Lloyd Huger, MD      . 0.9 %  sodium chloride infusion   Intravenous Once Lloyd Huger, MD 999 mL/hr at 07/13/18 1135 1,000 mL at 07/13/18 1135  . dexamethasone (DECADRON) 20 mg in sodium chloride 0.9 % 50 mL IVPB  20 mg Intravenous Once Lloyd Huger, MD      . dexamethasone (DECADRON) injection 10 mg  10 mg Intravenous Once Lloyd Huger, MD      . gemcitabine (GEMZAR) 1,600 mg in sodium chloride 0.9 % 250 mL chemo infusion  1,600 mg Intravenous Once Lloyd Huger, MD      . heparin lock flush 100 unit/mL  500 Units Intracatheter Once PRN Lloyd Huger, MD      . sodium chloride flush (NS) 0.9 % injection 10 mL  10 mL Intravenous PRN Lloyd Huger, MD   10 mL at 06/16/17 0854    OBJECTIVE: Vitals:   07/13/18 1102  BP: (!) 153/83  Pulse: 73  Resp: 18     Body mass index is 31.09 kg/m.    ECOG FS:0 - Asymptomatic  General: Well-developed, well-nourished, no acute distress. Eyes: Pink conjunctiva, anicteric sclera. HEENT: Normocephalic, moist mucous membranes. Lungs: Clear to auscultation bilaterally. Heart: Regular rate and rhythm. No rubs, murmurs, or gallops. Abdomen: Soft, nontender, nondistended. No organomegaly noted, normoactive bowel sounds. Musculoskeletal: No edema, cyanosis, or clubbing. Neuro: Alert, answering all questions appropriately. Cranial nerves grossly intact. Skin: No rashes or petechiae noted. Psych: Normal affect.  LAB RESULTS:  Lab Results  Component Value Date   NA 139 07/13/2018   K 5.0 07/13/2018   CL 105 07/13/2018   CO2 26 07/13/2018   GLUCOSE 87 07/13/2018   BUN 50 (H) 07/13/2018   CREATININE  2.05 (H) 07/13/2018   CALCIUM 8.5 (L) 07/13/2018   PROT  6.2 (L) 07/13/2018   ALBUMIN 3.3 (L) 07/13/2018   AST 27 07/13/2018   ALT 19 07/13/2018   ALKPHOS 65 07/13/2018   BILITOT 0.6 07/13/2018   GFRNONAA 25 (L) 07/13/2018   GFRAA 29 (L) 07/13/2018    Lab Results  Component Value Date   WBC 4.5 07/13/2018   NEUTROABS 2.3 07/13/2018   HGB 8.0 (L) 07/13/2018   HCT 24.3 (L) 07/13/2018   MCV 109.5 (H) 07/13/2018   PLT 111 (L) 07/13/2018     STUDIES: Dg Chest Port 1 View  Result Date: 06/29/2018 CLINICAL DATA:  Shortness of breath and productive cough, history of ovarian carcinoma, initial encounter EXAM: PORTABLE CHEST 1 VIEW COMPARISON:  05/07/2018 FINDINGS: Cardiac shadows within normal limits. Right-sided chest wall port is noted. Lungs are well aerated bilaterally with diffuse increased interstitial changes noted bilaterally. Mild central vascular congestion is noted. No focal infiltrate is seen. No acute bony abnormality is noted. Postsurgical changes are noted in the cervical spine. IMPRESSION: Increased interstitial changes likely related to pulmonary edema. No focal infiltrate is noted. Electronically Signed   By: Inez Catalina M.D.   On: 06/29/2018 21:47    ONCOLOGY HISTORY: Patient underwent debulking surgery at [facility] on November 19, 2016. She received one infusion preoperative infusion of Keytruda on November 11, 2016 as part of a clinical trial.  She initiated adjuvant carboplatinum and Taxol on December 16, 2016.  This was continued through April 28, 2017 at which time Taxol was discontinued secondary to worsening neuropathy.  Patient then received single agent carboplatinum for additional 2 infusions.  She was then noted to have progression of disease and was switched to Avastin and doxorubicin on August 18, 2017 this was discontinued on November 24, 2017 secondary to declining performance status.  Palliative gemcitabine was initiated on February 20, 2018.   ASSESSMENT:  Stage IIIc high-grade serous ovarian carcinoma  PLAN:  1. Stage IIIc high-grade serous ovarian carcinoma: See oncology history as above.  PET scan results from May 07, 2018 reviewed independently with significant improvement in disease burden.  Patient CA-125 appears to be erratic ranging from 40s to 60s although her most recent result was 69.0.  Monitor closely. Patient has inquired about intraperitoneal chemotherapy, but the benefit is unclear with increased toxicity.  Proceed with cycle 5, day 15 of single agent gemcitabine.  Return to clinic in 2 weeks for further evaluation and consideration of cycle 6, day 1.  Will consider reimaging at the conclusion of cycle 6. 2. Pain: Well controlled.  Continue oxycodone as needed.   3. Anxiety: Chronic and unchanged.  Continue Xanax as prescribed. 4.  Peripheral neuropathy: Chronic and unchanged.  Continue gabapentin as prescribed.  Patient has agreed to acupuncture.   5.  Anemia: Patient's hemoglobin remains decreased, but improved since her transfusion is now 8.0.  Monitor.   6.  Shoulder pain: Patient does not complain of this today.  Continue cortisone injections as needed.  Patient is also indicated she may need rotator cuff surgery in the future.  Okay to proceed from an oncology standpoint, but will have to be coordinated with chemotherapy. 7.  Thrombocytopenia: Improved.  Patient's platelet count is 111 today.  Proceed with treatment as above. 8.  Leukocytosis: Resolved. 9.  Elevated liver enzymes: AST and ALT are now within normal limits. 10.  Tooth pain: Patient does not complain of this today.  Continue follow-up with dentist as scheduled. 11.  Renal insufficiency: Creatinine is trended up to greater than 2.0.  Proceed with treatment as above.  Patient will also receive 1 L of IV fluids today. 12.  Shortness of breath: Patient does not complain of this today.   Patient expressed understanding and was in agreement with this plan. She also  understands that She can call clinic at any time with any questions, concerns, or complaints.   Cancer Staging Ovarian cancer, unspecified laterality ([place]) Staging form: Ovary, Fallopian Tube, and Primary Peritoneal Carcinoma, AJCC 8th Edition - Clinical: No stage assigned - Unsigned   Lloyd Huger, MD   07/13/2018 12:29 PM

## 2018-07-13 NOTE — Telephone Encounter (Signed)
Patient states that it is working for her. "So far so good"

## 2018-07-13 NOTE — Progress Notes (Signed)
Creatinine: 2.05. MD, Dr. Grayland Ormond, notified and already aware. Per MD order: proceed with scheduled Gemzar treatment today.

## 2018-07-20 ENCOUNTER — Telehealth: Payer: Self-pay | Admitting: *Deleted

## 2018-07-20 ENCOUNTER — Other Ambulatory Visit: Payer: Self-pay

## 2018-07-20 ENCOUNTER — Inpatient Hospital Stay (HOSPITAL_BASED_OUTPATIENT_CLINIC_OR_DEPARTMENT_OTHER): Payer: PPO | Admitting: Hospice and Palliative Medicine

## 2018-07-20 ENCOUNTER — Inpatient Hospital Stay: Payer: PPO | Attending: Oncology

## 2018-07-20 ENCOUNTER — Inpatient Hospital Stay: Payer: PPO

## 2018-07-20 ENCOUNTER — Encounter: Payer: Self-pay | Admitting: Hospice and Palliative Medicine

## 2018-07-20 VITALS — BP 170/85 | HR 75 | Temp 95.7°F | Resp 20 | Ht 62.0 in | Wt 172.3 lb

## 2018-07-20 DIAGNOSIS — R531 Weakness: Secondary | ICD-10-CM

## 2018-07-20 DIAGNOSIS — K0889 Other specified disorders of teeth and supporting structures: Secondary | ICD-10-CM | POA: Diagnosis not present

## 2018-07-20 DIAGNOSIS — C569 Malignant neoplasm of unspecified ovary: Secondary | ICD-10-CM

## 2018-07-20 DIAGNOSIS — Z87891 Personal history of nicotine dependence: Secondary | ICD-10-CM | POA: Diagnosis not present

## 2018-07-20 DIAGNOSIS — M542 Cervicalgia: Secondary | ICD-10-CM | POA: Diagnosis not present

## 2018-07-20 DIAGNOSIS — Z5189 Encounter for other specified aftercare: Secondary | ICD-10-CM

## 2018-07-20 DIAGNOSIS — Z85828 Personal history of other malignant neoplasm of skin: Secondary | ICD-10-CM | POA: Insufficient documentation

## 2018-07-20 DIAGNOSIS — N179 Acute kidney failure, unspecified: Secondary | ICD-10-CM | POA: Diagnosis not present

## 2018-07-20 DIAGNOSIS — R630 Anorexia: Secondary | ICD-10-CM | POA: Insufficient documentation

## 2018-07-20 DIAGNOSIS — R5383 Other fatigue: Secondary | ICD-10-CM | POA: Diagnosis not present

## 2018-07-20 DIAGNOSIS — R0602 Shortness of breath: Secondary | ICD-10-CM | POA: Diagnosis not present

## 2018-07-20 DIAGNOSIS — T451X5A Adverse effect of antineoplastic and immunosuppressive drugs, initial encounter: Secondary | ICD-10-CM | POA: Diagnosis not present

## 2018-07-20 DIAGNOSIS — R5381 Other malaise: Secondary | ICD-10-CM | POA: Insufficient documentation

## 2018-07-20 DIAGNOSIS — G8929 Other chronic pain: Secondary | ICD-10-CM

## 2018-07-20 DIAGNOSIS — G4700 Insomnia, unspecified: Secondary | ICD-10-CM | POA: Diagnosis not present

## 2018-07-20 DIAGNOSIS — Z95828 Presence of other vascular implants and grafts: Secondary | ICD-10-CM

## 2018-07-20 DIAGNOSIS — Z79899 Other long term (current) drug therapy: Secondary | ICD-10-CM | POA: Diagnosis not present

## 2018-07-20 DIAGNOSIS — Z5111 Encounter for antineoplastic chemotherapy: Secondary | ICD-10-CM | POA: Diagnosis not present

## 2018-07-20 DIAGNOSIS — M1990 Unspecified osteoarthritis, unspecified site: Secondary | ICD-10-CM | POA: Diagnosis not present

## 2018-07-20 DIAGNOSIS — D6959 Other secondary thrombocytopenia: Secondary | ICD-10-CM | POA: Insufficient documentation

## 2018-07-20 DIAGNOSIS — Z7951 Long term (current) use of inhaled steroids: Secondary | ICD-10-CM | POA: Diagnosis not present

## 2018-07-20 DIAGNOSIS — R609 Edema, unspecified: Secondary | ICD-10-CM

## 2018-07-20 DIAGNOSIS — D6481 Anemia due to antineoplastic chemotherapy: Secondary | ICD-10-CM | POA: Diagnosis not present

## 2018-07-20 DIAGNOSIS — D701 Agranulocytosis secondary to cancer chemotherapy: Secondary | ICD-10-CM | POA: Insufficient documentation

## 2018-07-20 DIAGNOSIS — G620 Drug-induced polyneuropathy: Secondary | ICD-10-CM | POA: Insufficient documentation

## 2018-07-20 DIAGNOSIS — Z515 Encounter for palliative care: Secondary | ICD-10-CM

## 2018-07-20 DIAGNOSIS — F419 Anxiety disorder, unspecified: Secondary | ICD-10-CM | POA: Insufficient documentation

## 2018-07-20 DIAGNOSIS — M5441 Lumbago with sciatica, right side: Secondary | ICD-10-CM

## 2018-07-20 DIAGNOSIS — M7989 Other specified soft tissue disorders: Secondary | ICD-10-CM | POA: Insufficient documentation

## 2018-07-20 DIAGNOSIS — M25519 Pain in unspecified shoulder: Secondary | ICD-10-CM | POA: Insufficient documentation

## 2018-07-20 LAB — CBC WITH DIFFERENTIAL/PLATELET
Abs Immature Granulocytes: 0.25 10*3/uL — ABNORMAL HIGH (ref 0.00–0.07)
Basophils Absolute: 0 10*3/uL (ref 0.0–0.1)
Basophils Relative: 1 %
Eosinophils Absolute: 0.1 10*3/uL (ref 0.0–0.5)
Eosinophils Relative: 2 %
HCT: 22.8 % — ABNORMAL LOW (ref 36.0–46.0)
Hemoglobin: 7.7 g/dL — ABNORMAL LOW (ref 12.0–15.0)
Immature Granulocytes: 8 %
Lymphocytes Relative: 24 %
Lymphs Abs: 0.8 10*3/uL (ref 0.7–4.0)
MCH: 35.3 pg — ABNORMAL HIGH (ref 26.0–34.0)
MCHC: 33.8 g/dL (ref 30.0–36.0)
MCV: 104.6 fL — ABNORMAL HIGH (ref 80.0–100.0)
Monocytes Absolute: 0.5 10*3/uL (ref 0.1–1.0)
Monocytes Relative: 16 %
Neutro Abs: 1.7 10*3/uL (ref 1.7–7.7)
Neutrophils Relative %: 49 %
Platelets: 158 10*3/uL (ref 150–400)
RBC: 2.18 MIL/uL — ABNORMAL LOW (ref 3.87–5.11)
RDW: 18.3 % — ABNORMAL HIGH (ref 11.5–15.5)
WBC: 3.3 10*3/uL — ABNORMAL LOW (ref 4.0–10.5)
nRBC: 2.1 % — ABNORMAL HIGH (ref 0.0–0.2)

## 2018-07-20 LAB — COMPREHENSIVE METABOLIC PANEL
ALT: 32 U/L (ref 0–44)
AST: 64 U/L — ABNORMAL HIGH (ref 15–41)
Albumin: 2.8 g/dL — ABNORMAL LOW (ref 3.5–5.0)
Alkaline Phosphatase: 61 U/L (ref 38–126)
Anion gap: 9 (ref 5–15)
BUN: 38 mg/dL — ABNORMAL HIGH (ref 8–23)
CO2: 22 mmol/L (ref 22–32)
Calcium: 8 mg/dL — ABNORMAL LOW (ref 8.9–10.3)
Chloride: 105 mmol/L (ref 98–111)
Creatinine, Ser: 1.82 mg/dL — ABNORMAL HIGH (ref 0.44–1.00)
GFR calc Af Amer: 34 mL/min — ABNORMAL LOW (ref >60)
GFR calc non Af Amer: 29 mL/min — ABNORMAL LOW (ref >60)
Glucose, Bld: 91 mg/dL (ref 70–99)
Potassium: 4.4 mmol/L (ref 3.5–5.1)
Sodium: 136 mmol/L (ref 135–145)
Total Bilirubin: 0.9 mg/dL (ref 0.3–1.2)
Total Protein: 5.6 g/dL — ABNORMAL LOW (ref 6.5–8.1)

## 2018-07-20 LAB — TSH: TSH: 7.281 u[IU]/mL — ABNORMAL HIGH (ref 0.350–4.500)

## 2018-07-20 LAB — T4, FREE: Free T4: 0.79 ng/dL — ABNORMAL LOW (ref 0.82–1.77)

## 2018-07-20 MED ORDER — OXYCODONE-ACETAMINOPHEN 10-325 MG PO TABS: 1.0000 | ORAL_TABLET | Freq: Four times a day (QID) | ORAL | 0 refills | Status: DC | PRN

## 2018-07-20 MED ORDER — PREDNISONE 10 MG PO TABS: ORAL_TABLET | ORAL | 0 refills | Status: DC

## 2018-07-20 MED ORDER — HEPARIN SOD (PORK) LOCK FLUSH 100 UNIT/ML IV SOLN
500.0000 [IU] | Freq: Once | INTRAVENOUS | Status: AC
  Administered 2018-07-20: 500 [IU]

## 2018-07-20 MED ORDER — ONDANSETRON 8 MG PO TBDP: 8.0000 mg | ORAL_TABLET | Freq: Three times a day (TID) | ORAL | 3 refills | Status: AC | PRN

## 2018-07-20 MED ORDER — SODIUM CHLORIDE 0.9% FLUSH
10.0000 mL | Freq: Once | INTRAVENOUS | Status: AC
  Administered 2018-07-20: 13:00:00 10 mL via INTRAVENOUS
  Filled 2018-07-20: qty 10

## 2018-07-20 NOTE — Telephone Encounter (Signed)
Patient called reporting that she has not felt well in a few weeks and would like to come in to be seen. Reports she has no energy and all she wants to do is sleep all day. She would like her labs checked also. Please advise

## 2018-07-20 NOTE — Telephone Encounter (Signed)
Please schedule for [facility] with labs (CBC, CMP, TSH) and infusion. Please pre-screen for covid-19 per protocol; patient had negative test result in April.

## 2018-07-20 NOTE — Progress Notes (Signed)
[place]  [phone] [phone]   Name: Jessica Burgess Date: 07/20/2018 MRN: [id number]  DOB: 1955/09/28  Patient Care Team: Steele Sizer, MD as PCP - General (Family Medicine) Lloyd Huger, MD as Consulting Physician (Oncology) Mellody Drown, MD as Consulting Physician (Obstetrics and Gynecology) Cathi Roan, [facility] (Pharmacist) Benedetto Goad, RN as Case Manager Clent Jacks, RN as Registered Nurse    REASON FOR CONSULTATION: Palliative Care consult requested for this 63 y.o. female with multiple medical problems including stage IIIc high-grade serous ovarian carcinoma currently receiving gemcitabine.  She is status post debulking surgery at [facility] in 2018 and received one preoperative dose of Keytruda as part of a clinical trial.  Patient completed adjuvant carboplatinum and Taxol, which was discontinued secondary to worsening neuropathy.  Patient was also tried on Avastin and doxorubicin due to disease progression but was ultimately switched to gemcitabine secondary to declining performance status.  She has been on treatment with gemcitabine since December 2019.  Patient was referred to palliative care to help address symptoms and goals.  SOCIAL HISTORY:     reports that she quit smoking about 18 years ago. Her smoking use included cigarettes. She started smoking about 39 years ago. She has a 30.00 pack-year smoking history. She has never used smokeless tobacco. She reports previous alcohol use. She reports that she does not use drugs.  Patient is unmarried. Has no children. She lives at home with her sister. She also has two brothers. Patient formerly worked at the [employer] and then [employer] improvement.  ADVANCE DIRECTIVES:    CODE STATUS:   PAST MEDICAL HISTORY: Past Medical History:  Diagnosis Date  . Allergic rhinitis, cause unspecified   . Anxiety state, unspecified   .  Arthritis   . Asthma    only when sick   . Backache, unspecified   . Bronchitis    hx of when get sick  . Cancer ([place])    skin cancer , basal cell   . Cancer ([place]) 11/2016   ovarian  . Cervicalgia   . Complication of anesthesia   . Dermatophytosis of nail   . Dysmetabolic syndrome X   . Encounter for long-term (current) use of other medications   . Esophageal reflux   . Insomnia, unspecified   . Leukocytosis, unspecified   . Migraine without aura, without mention of intractable migraine without mention of status migrainosus   . Other and unspecified hyperlipidemia   . Other malaise and fatigue   . Overweight(278.02)   . Personal history of chemotherapy now   ovarian  . PONV (postoperative nausea and vomiting)   . Spinal stenosis in cervical region   . Symptomatic menopausal or female climacteric states   . Unspecified disorder of skin and subcutaneous tissue   . Unspecified vitamin D deficiency     PAST SURGICAL HISTORY:  Past Surgical History:  Procedure Laterality Date  . ABDOMINAL HYSTERECTOMY    . ANTERIOR CERVICAL DECOMP/DISCECTOMY FUSION N/A 06/07/2015   Procedure: Cervical three - four and Cervical six- seven anterior cervical decompression with fusion interbody prosthesis plating and bonegraft;  Surgeon: Newman Pies, MD;  Location: [place] NEURO ORS;  Service: Neurosurgery;  Laterality: N/A;  C34 and C67 anterior cervical decompression with fusion interbody prosthesis plating and bonegraft  . BACK SURGERY     x2 Lower   . EVACUATION OF CERVICAL HEMATOMA N/A 06/14/2015   Procedure: EVACUATION OF CERVICAL HEMATOMA;  Surgeon: Newman Pies, MD;  Location: [facility]URO [facility]S;  Service: Neurosurgery;  Laterality: N/A;  . NECK SURGERY     x3  . TONSILLECTOMY      HEMATOLOGY/ONCOLOGY HISTORY:    Ovarian cancer, unspecified laterality ([place])   11/12/2016 Initial Diagnosis    Ovarian cancer, unspecified laterality ([place])    08/08/2017 - 12/07/2017 Chemotherapy    The patient  had bevacizumab (AVASTIN) 700 mg in sodium chloride 0.9 % 100 mL chemo infusion, 725 mg, Intravenous,  Once, 4 of 6 cycles Administration: 700 mg (08/18/2017), 700 mg (09/01/2017), 700 mg (09/15/2017), 700 mg (09/29/2017), 700 mg (10/13/2017), 700 mg (10/27/2017), 700 mg (11/10/2017), 700 mg (11/24/2017) DOXOrubicin HCL LIPOSOMAL (DOXIL) 70 mg in dextrose 5 % 250 mL chemo infusion, 72 mg, Intravenous,  Once, 4 of 6 cycles Administration: 70 mg (08/18/2017), 70 mg (09/15/2017), 70 mg (10/13/2017), 70 mg (11/10/2017)  for chemotherapy treatment.     02/19/2018 -  Chemotherapy    The patient had gemcitabine (GEMZAR) 1,800 mg in sodium chloride 0.9 % 250 mL chemo infusion, 1,748 mg, Intravenous,  Once, 5 of 6 cycles Dose modification: 900 mg/m2 (original dose 1,000 mg/m2, Cycle 1, Reason: Other (see comments)) Administration: 1,800 mg (02/19/2018), 1,800 mg (02/26/2018), 1,600 mg (03/05/2018), 1,600 mg (03/23/2018), 1,600 mg (03/30/2018), 1,600 mg (04/06/2018), 1,600 mg (04/20/2018), 1,600 mg (04/27/2018), 1,600 mg (05/25/2018), 1,600 mg (06/01/2018), 1,600 mg (06/08/2018), 1,600 mg (06/22/2018), 1,600 mg (06/29/2018), 1,600 mg (07/13/2018)  for chemotherapy treatment.      ALLERGIES:  has No Known Allergies.  MEDICATIONS:  Current Outpatient Medications  Medication Sig Dispense Refill  . albuterol (PROVENTIL HFA;VENTOLIN HFA) 108 (90 Base) MCG/ACT inhaler Inhale 2 puffs into the lungs every 6 (six) hours as needed for wheezing or shortness of breath. 1 Inhaler 2  . albuterol (PROVENTIL) (2.5 MG/3ML) 0.083% nebulizer solution Take 3 mLs (2.5 mg total) by nebulization every 6 (six) hours as needed for wheezing or shortness of breath. 75 mL 2  . Ascorbic Acid (VITAMIN C PO) Take 1 tablet by mouth daily.     . budesonide-formoterol (SYMBICORT) 160-4.5 MCG/ACT inhaler Inhale 2 puffs into the lungs 2 (two) times daily.    . Cholecalciferol (VITAMIN D-1000 MAX ST) 1000 UNITS tablet Take 1 tablet by mouth 2 (two) times daily.    .  Cyanocobalamin (B-12 PO) Take 1 tablet by mouth daily.     . cyclobenzaprine (FLEXERIL) 10 MG tablet Take one tablet every 8 hours as needed 30 tablet 1  . DULoxetine (CYMBALTA) 60 MG capsule TAKE 1 CAPSULE(60 MG) BY MOUTH DAILY 180 capsule 1  . estradiol (ESTRACE) 0.5 MG tablet Take 1 tablet (0.5 mg total) by mouth daily. 90 tablet 4  . ferrous sulfate (IRON SUPPLEMENT) 325 (65 FE) MG tablet Take 1 tablet by mouth daily.    . fluticasone (FLONASE) 50 MCG/ACT nasal spray Place 2 sprays into both nostrils as needed. 48 g 1  . gabapentin (NEURONTIN) 300 MG capsule Take 1-3 capsules (300-900 mg total) by mouth 3 (three) times daily. 300 mg twice a day and 900 mg at night 540 capsule 1  . ipratropium (ATROVENT HFA) 17 MCG/ACT inhaler Inhale 2 puffs into the lungs every 6 (six) hours as needed.     . lidocaine (XYLOCAINE) 2 % solution Use as directed 15 mLs in the mouth or throat as needed for mouth pain. 100 mL 0  . Lysine 1000 MG TABS Take 1 tablet by mouth daily.     . Magnesium  Oxide 500 MG CAPS Take 500 mg by mouth 2 (two) times daily.     . Multiple Vitamins-Minerals (MULTIVITAMIN PO) Take 1 tablet by mouth daily.     . Omega-3 Fatty Acids (FISH OIL PO) Take 1 capsule by mouth 2 (two) times daily.     . ondansetron (ZOFRAN-ODT) 8 MG disintegrating tablet Take 1 tablet (8 mg total) by mouth every 8 (eight) hours as needed for nausea or vomiting. 60 tablet 0  . oxyCODONE-acetaminophen (PERCOCET) 10-325 MG tablet Take 1 tablet by mouth every 6 (six) hours as needed for pain. 120 tablet 0  . potassium gluconate 595 MG TABS tablet Take 595 mg by mouth daily.    . SUMAtriptan (IMITREX) 100 MG tablet May repeat in 2 hours if headache persists or recurs. 9 tablet 0  . temazepam (RESTORIL) 15 MG capsule TAKE 1 CAPSULE(15 MG) BY MOUTH AT BEDTIME AS NEEDED FOR SLEEP 30 capsule 2  . triamcinolone (KENALOG) 0.1 % paste Use as directed 1 application in the mouth or throat 2 (two) times daily. 5 g 2  . urea  (CARMOL) 10 % cream Apply topically as needed. 410 g 1  . valACYclovir (VALTREX) 1000 MG tablet TAKE 1 TABLET(1000 MG) BY MOUTH THREE TIMES DAILY FOR 7 DAYS 21 tablet 0   No current facility-administered medications for this visit.    Facility-Administered Medications Ordered in Other Visits  Medication Dose Route Frequency Provider Last Rate Last Dose  . 0.9 %  sodium chloride infusion   Intravenous Once Lloyd Huger, MD      . 0.9 %  sodium chloride infusion   Intravenous Once Lloyd Huger, MD      . dexamethasone (DECADRON) 20 mg in sodium chloride 0.9 % 50 mL IVPB  20 mg Intravenous Once Lloyd Huger, MD      . dexamethasone (DECADRON) injection 10 mg  10 mg Intravenous Once Lloyd Huger, MD      . sodium chloride flush (NS) 0.9 % injection 10 mL  10 mL Intravenous PRN Lloyd Huger, MD   10 mL at 06/16/17 0854    VITAL SIGNS: BP (!) 170/85 (BP Location: Left Arm, Patient Position: Sitting, Cuff Size: Normal)   Pulse 75   Temp (!) 95.7 F (35.4 C) (Tympanic)   Resp 20   Ht 5\' 2"  (1.575 m)   Wt 172 lb 4.8 oz (78.2 kg)   BMI 31.51 kg/m  Filed Weights   07/20/18 1346  Weight: 172 lb 4.8 oz (78.2 kg)    Estimated body mass index is 31.51 kg/m as calculated from the following:   Height as of this encounter: 5\' 2"  (1.575 m).   Weight as of this encounter: 172 lb 4.8 oz (78.2 kg).  LABS: CBC:    Component Value Date/Time   WBC 4.5 07/13/2018 0932   HGB 8.0 (L) 07/13/2018 0932   HCT 24.3 (L) 07/13/2018 0932   PLT 111 (L) 07/13/2018 0932   MCV 109.5 (H) 07/13/2018 0932   NEUTROABS 2.3 07/13/2018 0932   LYMPHSABS 1.1 07/13/2018 0932   MONOABS 0.9 07/13/2018 0932   EOSABS 0.1 07/13/2018 0932   BASOSABS 0.0 07/13/2018 0932   Comprehensive Metabolic Panel:    Component Value Date/Time   NA 136 07/20/2018 1321   NA 139 07/31/2015 1246   K 4.4 07/20/2018 1321   CL 105 07/20/2018 1321   CO2 22 07/20/2018 1321   BUN 38 (H) 07/20/2018 1321    BUN 15  07/31/2015 1246   CREATININE 1.82 (H) 07/20/2018 1321   CREATININE 0.90 05/17/2016 1052   GLUCOSE 91 07/20/2018 1321   CALCIUM 8.0 (L) 07/20/2018 1321   AST 64 (H) 07/20/2018 1321   AST 32 02/19/2012 0935   ALT 32 07/20/2018 1321   ALT 36 02/19/2012 0935   ALKPHOS 61 07/20/2018 1321   ALKPHOS 96 02/19/2012 0935   BILITOT 0.9 07/20/2018 1321   BILITOT 0.5 07/31/2015 1246   BILITOT 0.4 02/19/2012 0935   PROT 5.6 (L) 07/20/2018 1321   PROT 6.6 07/31/2015 1246   PROT 6.9 02/19/2012 0935   ALBUMIN 2.8 (L) 07/20/2018 1321   ALBUMIN 4.3 07/31/2015 1246   ALBUMIN 3.6 02/19/2012 0935    RADIOGRAPHIC STUDIES: Dg Chest Port 1 View  Result Date: 06/29/2018 CLINICAL DATA:  Shortness of breath and productive cough, history of ovarian carcinoma, initial encounter EXAM: PORTABLE CHEST 1 VIEW COMPARISON:  05/07/2018 FINDINGS: Cardiac shadows within normal limits. Right-sided chest wall port is noted. Lungs are well aerated bilaterally with diffuse increased interstitial changes noted bilaterally. Mild central vascular congestion is noted. No focal infiltrate is seen. No acute bony abnormality is noted. Postsurgical changes are noted in the cervical spine. IMPRESSION: Increased interstitial changes likely related to pulmonary edema. No focal infiltrate is noted. Electronically Signed   By: Inez Catalina M.D.   On: 06/29/2018 21:47    PERFORMANCE STATUS (ECOG) : 1 - Symptomatic but completely ambulatory  Review of Systems Unless otherwise noted, a complete review of systems is negative.  Physical Exam General: NAD, frail appearing HEENT: cheeks appear puffy, lips not swollen, no swelling to tongue Cardiovascular: regular rate and rhythm Pulmonary: clear ant fields  Abdomen: soft, nontender, + bowel sounds GU: no suprapubic tenderness Extremities: no LE edema, puffy hands Bilaterally Skin: no rashes Neurological: Weakness but otherwise nonfocal  IMPRESSION: Patient was an add-on  today to the clinic due to fatigue, weakness, and report of swelling to face and hands.  She says symptoms started 4 or 5 days ago and have persisted.  She has had very little energy.  She denies fever or chills, diarrhea or constipation, UTI symptoms, cough or congestion, chest pain, shortness of breath, body aches. She has had occasional nausea but no vomiting.  She says food and fluid intake is been adequate.  Patient says that she presented similarly in March.  Upon review of the chart, I note that patient was found to have upper extremity swelling on 05/18/2018.  Patient was started on a prednisone dosepak and seemed to have resolution of symptoms.  Etiology was unclear but was thought to be related to an allergic reaction.  Will treat patient similarly today with prednisone taper.  Patient to utilize the ER if symptoms significantly worsen or she begins to have difficulty breathing.  She has a scheduled follow-up visit with Dr. Grayland Ormond next week.  Patient instructed to keep food diary and note changes in cleaning or hygiene products.  CBC and Cmet reviewed.  Serum creatinine elevated but is actually improved from previous value.  Hemoglobin downtrending. Follow and recommend transfusion if below 7.  Neutropenia noted but likely from chemotherapy.  Case and plan discussed with Dr. Grayland Ormond.  PDMP reviewed.  PLAN: -Continue current scope of treatment -Prednisone taper 50mg  x 1 day , 40mg  x 1 day, 30mg  x 1 day, 20mg  x 1 day, 10mg  x 1 day, and then stop -Food/hygeine diary -Refill Percocet 10-325mg  (Rx #120) -Refill Zofran -Next clinic appointment is with Dr. Janese Banks  on 07/27/2018   Patient expressed understanding and was in agreement with this plan. She also understands that She can call the clinic at any time with any questions, concerns, or complaints.     Time Total: 30 minutes  Visit consisted of counseling and education dealing with the complex and emotionally intense issues of symptom  management and palliative care in the setting of serious and potentially life-threatening illness.Greater than 50%  of this time was spent counseling and coordinating care related to the above assessment and plan.  Signed by: Altha Harm, PhD, NP-C [phone] (Work Cell)

## 2018-07-20 NOTE — Addendum Note (Signed)
Addended by: Irean Hong on: 07/20/2018 04:37 PM   Modules accepted: Orders

## 2018-07-20 NOTE — Telephone Encounter (Signed)
[facility] please 

## 2018-07-20 NOTE — Telephone Encounter (Signed)
Patient accepts appointment for 115

## 2018-07-21 ENCOUNTER — Other Ambulatory Visit: Payer: Self-pay | Admitting: Hospice and Palliative Medicine

## 2018-07-21 DIAGNOSIS — R5383 Other fatigue: Secondary | ICD-10-CM

## 2018-07-21 MED ORDER — LEVOTHYROXINE SODIUM 25 MCG PO TABS: 25.0000 ug | ORAL_TABLET | Freq: Every day | ORAL | 2 refills | Status: DC

## 2018-07-21 NOTE — Progress Notes (Signed)
TSH elevated. Free T4 borderline low. Chart reviewed and it appears that patient was treated for hypothyroidism in 2018, likely due to effect from medication.  Synthroid was later discontinued.  Fatigue could be in part related to subclinical hypothyroidism.  Given age, will restart low-dose Synthroid at 25 mcg daily and plan to recheck TSH in 6 weeks.  I called and spoke with patient.  She was in agreement with this plan.  She says she noticed last night what appeared to be a bug bite of some sort on her head and wondered if that could have contributed to her swelling.  She was not interested in being seen today in the clinic.  She will report any changes or seek evaluation in the ER in the event of worsening symptoms, erythema, swelling, difficulty breathing.  Case and plan discussed with Dr. Grayland Ormond.  Plan: -Synthroid 25 mcg daily -Recheck TSH in 6 weeks -RTC on 5/11, or sooner if needed

## 2018-07-27 ENCOUNTER — Other Ambulatory Visit: Payer: Self-pay | Admitting: *Deleted

## 2018-07-27 ENCOUNTER — Other Ambulatory Visit: Payer: Self-pay

## 2018-07-27 ENCOUNTER — Inpatient Hospital Stay: Payer: PPO

## 2018-07-27 ENCOUNTER — Inpatient Hospital Stay (HOSPITAL_BASED_OUTPATIENT_CLINIC_OR_DEPARTMENT_OTHER): Payer: PPO | Admitting: Oncology

## 2018-07-27 ENCOUNTER — Encounter: Payer: Self-pay | Admitting: Oncology

## 2018-07-27 ENCOUNTER — Other Ambulatory Visit: Payer: Self-pay | Admitting: Oncology

## 2018-07-27 VITALS — BP 168/90 | HR 74 | Temp 96.5°F | Resp 18 | Wt 167.3 lb

## 2018-07-27 DIAGNOSIS — Z79899 Other long term (current) drug therapy: Secondary | ICD-10-CM

## 2018-07-27 DIAGNOSIS — Z7951 Long term (current) use of inhaled steroids: Secondary | ICD-10-CM

## 2018-07-27 DIAGNOSIS — R531 Weakness: Secondary | ICD-10-CM | POA: Diagnosis not present

## 2018-07-27 DIAGNOSIS — D6481 Anemia due to antineoplastic chemotherapy: Secondary | ICD-10-CM

## 2018-07-27 DIAGNOSIS — M1990 Unspecified osteoarthritis, unspecified site: Secondary | ICD-10-CM

## 2018-07-27 DIAGNOSIS — D649 Anemia, unspecified: Secondary | ICD-10-CM

## 2018-07-27 DIAGNOSIS — R5381 Other malaise: Secondary | ICD-10-CM

## 2018-07-27 DIAGNOSIS — R5383 Other fatigue: Secondary | ICD-10-CM

## 2018-07-27 DIAGNOSIS — Z87891 Personal history of nicotine dependence: Secondary | ICD-10-CM

## 2018-07-27 DIAGNOSIS — R0602 Shortness of breath: Secondary | ICD-10-CM

## 2018-07-27 DIAGNOSIS — Z5111 Encounter for antineoplastic chemotherapy: Secondary | ICD-10-CM | POA: Diagnosis not present

## 2018-07-27 DIAGNOSIS — M7989 Other specified soft tissue disorders: Secondary | ICD-10-CM

## 2018-07-27 DIAGNOSIS — K0889 Other specified disorders of teeth and supporting structures: Secondary | ICD-10-CM

## 2018-07-27 DIAGNOSIS — C569 Malignant neoplasm of unspecified ovary: Secondary | ICD-10-CM

## 2018-07-27 DIAGNOSIS — T451X5A Adverse effect of antineoplastic and immunosuppressive drugs, initial encounter: Secondary | ICD-10-CM

## 2018-07-27 DIAGNOSIS — D701 Agranulocytosis secondary to cancer chemotherapy: Secondary | ICD-10-CM | POA: Diagnosis not present

## 2018-07-27 DIAGNOSIS — R630 Anorexia: Secondary | ICD-10-CM

## 2018-07-27 DIAGNOSIS — Z85828 Personal history of other malignant neoplasm of skin: Secondary | ICD-10-CM

## 2018-07-27 DIAGNOSIS — M25519 Pain in unspecified shoulder: Secondary | ICD-10-CM

## 2018-07-27 DIAGNOSIS — G4700 Insomnia, unspecified: Secondary | ICD-10-CM

## 2018-07-27 DIAGNOSIS — N179 Acute kidney failure, unspecified: Secondary | ICD-10-CM

## 2018-07-27 DIAGNOSIS — G620 Drug-induced polyneuropathy: Secondary | ICD-10-CM

## 2018-07-27 DIAGNOSIS — F419 Anxiety disorder, unspecified: Secondary | ICD-10-CM

## 2018-07-27 DIAGNOSIS — D6959 Other secondary thrombocytopenia: Secondary | ICD-10-CM

## 2018-07-27 LAB — CBC WITH DIFFERENTIAL/PLATELET
Abs Immature Granulocytes: 0.96 10*3/uL — ABNORMAL HIGH (ref 0.00–0.07)
Basophils Absolute: 0 10*3/uL (ref 0.0–0.1)
Basophils Relative: 0 %
Eosinophils Absolute: 0.2 10*3/uL (ref 0.0–0.5)
Eosinophils Relative: 1 %
HCT: 20.9 % — ABNORMAL LOW (ref 36.0–46.0)
Hemoglobin: 6.9 g/dL — ABNORMAL LOW (ref 12.0–15.0)
Immature Granulocytes: 7 %
Lymphocytes Relative: 17 %
Lymphs Abs: 2.3 10*3/uL (ref 0.7–4.0)
MCH: 36.9 pg — ABNORMAL HIGH (ref 26.0–34.0)
MCHC: 33 g/dL (ref 30.0–36.0)
MCV: 111.8 fL — ABNORMAL HIGH (ref 80.0–100.0)
Monocytes Absolute: 1.8 10*3/uL — ABNORMAL HIGH (ref 0.1–1.0)
Monocytes Relative: 13 %
Neutro Abs: 8.4 10*3/uL — ABNORMAL HIGH (ref 1.7–7.7)
Neutrophils Relative %: 62 %
Platelets: 98 10*3/uL — ABNORMAL LOW (ref 150–400)
RBC: 1.87 MIL/uL — ABNORMAL LOW (ref 3.87–5.11)
RDW: 23.9 % — ABNORMAL HIGH (ref 11.5–15.5)
Smear Review: NORMAL
WBC: 13.6 10*3/uL — ABNORMAL HIGH (ref 4.0–10.5)
nRBC: 1.2 % — ABNORMAL HIGH (ref 0.0–0.2)

## 2018-07-27 LAB — COMPREHENSIVE METABOLIC PANEL
ALT: 25 U/L (ref 0–44)
AST: 40 U/L (ref 15–41)
Albumin: 3.2 g/dL — ABNORMAL LOW (ref 3.5–5.0)
Alkaline Phosphatase: 55 U/L (ref 38–126)
Anion gap: 11 (ref 5–15)
BUN: 41 mg/dL — ABNORMAL HIGH (ref 8–23)
CO2: 25 mmol/L (ref 22–32)
Calcium: 8.5 mg/dL — ABNORMAL LOW (ref 8.9–10.3)
Chloride: 103 mmol/L (ref 98–111)
Creatinine, Ser: 1.77 mg/dL — ABNORMAL HIGH (ref 0.44–1.00)
GFR calc Af Amer: 35 mL/min — ABNORMAL LOW (ref >60)
GFR calc non Af Amer: 30 mL/min — ABNORMAL LOW (ref >60)
Glucose, Bld: 90 mg/dL (ref 70–99)
Potassium: 4.4 mmol/L (ref 3.5–5.1)
Sodium: 139 mmol/L (ref 135–145)
Total Bilirubin: 1 mg/dL (ref 0.3–1.2)
Total Protein: 6 g/dL — ABNORMAL LOW (ref 6.5–8.1)

## 2018-07-27 LAB — SAMPLE TO BLOOD BANK

## 2018-07-27 LAB — PREPARE RBC (CROSSMATCH)

## 2018-07-27 MED ORDER — PROCHLORPERAZINE MALEATE 10 MG PO TABS
10.0000 mg | ORAL_TABLET | Freq: Once | ORAL | Status: AC
  Administered 2018-07-27: 11:00:00 10 mg via ORAL
  Filled 2018-07-27: qty 1

## 2018-07-27 MED ORDER — SODIUM CHLORIDE 0.9 % IV SOLN
1600.0000 mg | Freq: Once | INTRAVENOUS | Status: AC
  Administered 2018-07-27: 12:00:00 1600 mg via INTRAVENOUS
  Filled 2018-07-27: qty 26.3

## 2018-07-27 MED ORDER — SODIUM CHLORIDE 0.9 % IV SOLN
Freq: Once | INTRAVENOUS | Status: AC
  Administered 2018-07-27: 11:00:00 via INTRAVENOUS
  Filled 2018-07-27: qty 250

## 2018-07-27 MED ORDER — SODIUM CHLORIDE 0.9 % IV SOLN: 10.0000 mg | Freq: Once | INTRAVENOUS | Status: DC

## 2018-07-27 MED ORDER — HEPARIN SOD (PORK) LOCK FLUSH 100 UNIT/ML IV SOLN
500.0000 [IU] | Freq: Once | INTRAVENOUS | Status: AC
  Administered 2018-07-27: 500 [IU] via INTRAVENOUS
  Filled 2018-07-27: qty 5

## 2018-07-27 MED ORDER — DEXAMETHASONE SODIUM PHOSPHATE 10 MG/ML IJ SOLN
10.0000 mg | Freq: Once | INTRAMUSCULAR | Status: AC
  Administered 2018-07-27: 10 mg via INTRAVENOUS
  Filled 2018-07-27: qty 1

## 2018-07-27 MED ORDER — SODIUM CHLORIDE 0.9% FLUSH
10.0000 mL | INTRAVENOUS | Status: DC | PRN
  Administered 2018-07-27: 10:00:00 10 mL via INTRAVENOUS
  Filled 2018-07-27: qty 10

## 2018-07-27 NOTE — Progress Notes (Signed)
Patient here for follow up. Pt feels that hemoglobin is low, she feel tired and short of breath easily.

## 2018-07-27 NOTE — Progress Notes (Signed)
Hematology/Oncology Consult note [facility]  Telephone:([phone] [phone]  Patient Care Team: Steele Sizer, MD as PCP - General (Family Medicine) Lloyd Huger, MD as Consulting Physician (Oncology) Mellody Drown, MD as Consulting Physician (Obstetrics and Gynecology) Cathi Roan, [facility] (Pharmacist) Benedetto Goad, RN as Case Manager Clent Jacks, RN as Registered Nurse   Name of the patient: Jessica Burgess  [id number]  01/28/1956   Date of visit: 07/27/18  Diagnosis-stage IIIc high-grade serous ovarian carcinoma  Chief complaint/ Reason for visit-on treatment assessment prior to cycle 6-day 1 of single agent gemcitabine chemotherapy  Heme/Onc history: Patient is a 63 year old female with a history of 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  She was diagnosed in September 2018 and underwent debulking surgery followed by adjuvant carbotaxol chemotherapy.  She had disease progression in June 2019 and was switched to Doxil Avastin.  This was discontinued due to declining performance status and palliative single agent gemcitabine chemotherapy 3 weeks on 1 week off was initiated in December 2019.  She sees Dr. Grayland Ormond as an outpatient.  She does have chemo-induced anemia requiring intermittent transfusion.  Treatment also complicated by chemotherapy-induced peripheral neuropathy for which she is on gabapentin  Interval history-she feels fatigued today.  Denies any blood in her stool or urine.  Denies any fevers, chills, cough or productive sputum.  Her neuropathy is mild in her fingers but does report symptoms to be relatively severe in her feet.  She is currently on Neurontin for this  ECOG PS- 1 Pain scale- 0 Opioid associated constipation- no  Review of systems- Review of Systems  Constitutional: Positive for malaise/fatigue. Negative for chills, fever and weight loss.  HENT: Negative for congestion, ear  discharge and nosebleeds.   Eyes: Negative for blurred vision.  Respiratory: Negative for cough, hemoptysis, sputum production, shortness of breath and wheezing.   Cardiovascular: Negative for chest pain, palpitations, orthopnea and claudication.  Gastrointestinal: Negative for abdominal pain, blood in stool, constipation, diarrhea, heartburn, melena, nausea and vomiting.  Genitourinary: Negative for dysuria, flank pain, frequency, hematuria and urgency.  Musculoskeletal: Negative for back pain, joint pain and myalgias.  Skin: Negative for rash.  Neurological: Positive for sensory change (Peripheral neuropathy). Negative for dizziness, tingling, focal weakness, seizures, weakness and headaches.  Endo/Heme/Allergies: Does not bruise/bleed easily.  Psychiatric/Behavioral: Negative for depression and suicidal ideas. The patient does not have insomnia.       No Known Allergies   Past Medical History:  Diagnosis Date  . Allergic rhinitis, cause unspecified   . Anxiety state, unspecified   . Arthritis   . Asthma    only when sick   . Backache, unspecified   . Bronchitis    hx of when get sick  . Cancer ([place])    skin cancer , basal cell   . Cancer ([place]) 11/2016   ovarian  . Cervicalgia   . Complication of anesthesia   . Dermatophytosis of nail   . Dysmetabolic syndrome X   . Encounter for long-term (current) use of other medications   . Esophageal reflux   . Insomnia, unspecified   . Leukocytosis, unspecified   . Migraine without aura, without mention of intractable migraine without mention of status migrainosus   . Other and unspecified hyperlipidemia   . Other malaise and fatigue   . Overweight(278.02)   . Personal history of chemotherapy now   ovarian  . PONV (postoperative nausea and vomiting)   . Spinal  stenosis in cervical region   . Symptomatic menopausal or female climacteric states   . Unspecified disorder of skin and subcutaneous tissue   . Unspecified vitamin D  deficiency      Past Surgical History:  Procedure Laterality Date  . ABDOMINAL HYSTERECTOMY    . ANTERIOR CERVICAL DECOMP/DISCECTOMY FUSION N/A 06/07/2015   Procedure: Cervical three - four and Cervical six- seven anterior cervical decompression with fusion interbody prosthesis plating and bonegraft;  Surgeon: Newman Pies, MD;  Location: [place] [facility];  Service: Neurosurgery;  Laterality: N/A;  C34 and C67 anterior cervical decompression with fusion interbody prosthesis plating and bonegraft  . BACK SURGERY     x2 Lower   . EVACUATION OF CERVICAL HEMATOMA N/A 06/14/2015   Procedure: EVACUATION OF CERVICAL HEMATOMA;  Surgeon: Newman Pies, MD;  Location: [place] [facility];  Service: Neurosurgery;  Laterality: N/A;  . NECK SURGERY     x3  . TONSILLECTOMY      Social History   Socioeconomic History  . Marital status: Single    Spouse name: Not on file  . Number of children: 0  . Years of education: some college  . Highest education level: 12th grade  Occupational History    Employer: DISABLED  . Occupation: Disabled   Social Needs  . Financial resource strain: Very hard  . Food insecurity:    Worry: Never true    Inability: Never true  . Transportation needs:    Medical: No    Non-medical: No  Tobacco Use  . Smoking status: Former Smoker    Packs/day: 1.50    Years: 20.00    Pack years: 30.00    Types: Cigarettes    Start date: 03/19/1979    Last attempt to quit: 08/25/1999    Years since quitting: 18.9  . Smokeless tobacco: Never Used  . Tobacco comment: smoking cessation materials not required  Substance and Sexual Activity  . Alcohol use: Not Currently    Alcohol/week: 0.0 standard drinks  . Drug use: No  . Sexual activity: Never  Lifestyle  . Physical activity:    Days per week: 0 days    Minutes per session: 0 min  . Stress: Very much  Relationships  . Social connections:    Talks on phone: Patient refused    Gets together: Patient refused    Attends  religious service: Patient refused    Active member of club or organization: Patient refused    Attends meetings of clubs or organizations: Patient refused    Relationship status: Patient refused  . Intimate partner violence:    Fear of current or ex partner: No    Emotionally abused: No    Physically abused: No    Forced sexual activity: No  Other Topics Concern  . Not on file  Social History Narrative   Patient is single.    Patient lives with roommates.    Patient on disability    Patient has no children.    Patient has some college     Family History  Problem Relation Age of Onset  . Depression Mother   . Migraines Mother   . Dementia Father   . Diabetes Father   . Hyperlipidemia Father   . Hyperlipidemia Brother   . Hyperlipidemia Brother   . Breast cancer Paternal Aunt        45s     Current Outpatient Medications:  .  albuterol (PROVENTIL HFA;VENTOLIN HFA) 108 (90 Base) MCG/ACT inhaler, Inhale 2  puffs into the lungs every 6 (six) hours as needed for wheezing or shortness of breath., Disp: 1 Inhaler, Rfl: 2 .  albuterol (PROVENTIL) (2.5 MG/3ML) 0.083% nebulizer solution, Take 3 mLs (2.5 mg total) by nebulization every 6 (six) hours as needed for wheezing or shortness of breath., Disp: 75 mL, Rfl: 2 .  Ascorbic Acid (VITAMIN C PO), Take 1 tablet by mouth daily. , Disp: , Rfl:  .  budesonide-formoterol (SYMBICORT) 160-4.5 MCG/ACT inhaler, Inhale 2 puffs into the lungs 2 (two) times daily., Disp: , Rfl:  .  Cholecalciferol (VITAMIN D-1000 MAX ST) 1000 UNITS tablet, Take 1 tablet by mouth 2 (two) times daily., Disp: , Rfl:  .  Cyanocobalamin (B-12 PO), Take 1 tablet by mouth daily. , Disp: , Rfl:  .  cyclobenzaprine (FLEXERIL) 10 MG tablet, Take one tablet every 8 hours as needed, Disp: 30 tablet, Rfl: 1 .  DULoxetine (CYMBALTA) 60 MG capsule, TAKE 1 CAPSULE(60 MG) BY MOUTH DAILY, Disp: 180 capsule, Rfl: 1 .  estradiol (ESTRACE) 0.5 MG tablet, Take 1 tablet (0.5 mg total)  by mouth daily., Disp: 90 tablet, Rfl: 4 .  ferrous sulfate (IRON SUPPLEMENT) 325 (65 FE) MG tablet, Take 1 tablet by mouth daily., Disp: , Rfl:  .  fluticasone (FLONASE) 50 MCG/ACT nasal spray, Place 2 sprays into both nostrils as needed., Disp: 48 g, Rfl: 1 .  gabapentin (NEURONTIN) 300 MG capsule, Take 1-3 capsules (300-900 mg total) by mouth 3 (three) times daily. 300 mg twice a day and 900 mg at night, Disp: 540 capsule, Rfl: 1 .  ipratropium (ATROVENT HFA) 17 MCG/ACT inhaler, Inhale 2 puffs into the lungs every 6 (six) hours as needed. , Disp: , Rfl:  .  levothyroxine (SYNTHROID) 25 MCG tablet, Take 1 tablet (25 mcg total) by mouth daily before breakfast., Disp: 30 tablet, Rfl: 2 .  lidocaine (XYLOCAINE) 2 % solution, Use as directed 15 mLs in the mouth or throat as needed for mouth pain., Disp: 100 mL, Rfl: 0 .  Lysine 1000 MG TABS, Take 1 tablet by mouth daily. , Disp: , Rfl:  .  Magnesium Oxide 500 MG CAPS, Take 500 mg by mouth 2 (two) times daily. , Disp: , Rfl:  .  Multiple Vitamins-Minerals (MULTIVITAMIN PO), Take 1 tablet by mouth daily. , Disp: , Rfl:  .  Omega-3 Fatty Acids (FISH OIL PO), Take 1 capsule by mouth 2 (two) times daily. , Disp: , Rfl:  .  ondansetron (ZOFRAN-ODT) 8 MG disintegrating tablet, Take 1 tablet (8 mg total) by mouth every 8 (eight) hours as needed for nausea or vomiting., Disp: 60 tablet, Rfl: 3 .  oxyCODONE-acetaminophen (PERCOCET) 10-325 MG tablet, Take 1 tablet by mouth every 6 (six) hours as needed for pain., Disp: 120 tablet, Rfl: 0 .  potassium gluconate 595 MG TABS tablet, Take 595 mg by mouth daily., Disp: , Rfl:  .  SUMAtriptan (IMITREX) 100 MG tablet, May repeat in 2 hours if headache persists or recurs., Disp: 9 tablet, Rfl: 0 .  temazepam (RESTORIL) 15 MG capsule, TAKE 1 CAPSULE(15 MG) BY MOUTH AT BEDTIME AS NEEDED FOR SLEEP, Disp: 30 capsule, Rfl: 2 .  triamcinolone (KENALOG) 0.1 % paste, Use as directed 1 application in the mouth or throat 2 (two)  times daily., Disp: 5 g, Rfl: 2 .  urea (CARMOL) 10 % cream, Apply topically as needed., Disp: 410 g, Rfl: 1 .  predniSONE (DELTASONE) 10 MG tablet, Five tablets x 1 day, then four tablets  x 1 day, then three tablets x 1 day, then two tablets x 1 day, then one tablet x 1 day, then stop (Patient not taking: Reported on 07/27/2018), Disp: 15 tablet, Rfl: 0 .  valACYclovir (VALTREX) 1000 MG tablet, TAKE 1 TABLET(1000 MG) BY MOUTH THREE TIMES DAILY FOR 7 DAYS (Patient not taking: Reported on 07/27/2018), Disp: 21 tablet, Rfl: 0 No current facility-administered medications for this visit.   Facility-Administered Medications Ordered in Other Visits:  .  0.9 %  sodium chloride infusion, , Intravenous, Once, Finnegan, Kathlene November, MD .  0.9 %  sodium chloride infusion, , Intravenous, Once, Grayland Ormond, Kathlene November, MD .  dexamethasone (DECADRON) 20 mg in sodium chloride 0.9 % 50 mL IVPB, 20 mg, Intravenous, Once, Grayland Ormond, Kathlene November, MD .  dexamethasone (DECADRON) injection 10 mg, 10 mg, Intravenous, Once, Finnegan, Kathlene November, MD .  heparin lock flush 100 unit/mL, 500 Units, Intravenous, Once, Finnegan, Kathlene November, MD .  sodium chloride flush (NS) 0.9 % injection 10 mL, 10 mL, Intravenous, PRN, Lloyd Huger, MD, 10 mL at 06/16/17 0854 .  sodium chloride flush (NS) 0.9 % injection 10 mL, 10 mL, Intravenous, PRN, Lloyd Huger, MD, 10 mL at 07/27/18 1000  Physical exam:  Vitals:   07/27/18 1022  BP: (!) 168/90  Pulse: 74  Resp: 18  Temp: (!) 96.5 F (35.8 C)  TempSrc: Tympanic  Weight: 167 lb 4.8 oz (75.9 kg)   Physical Exam Constitutional:      General: She is not in acute distress.    Comments: Appears fatigued  HENT:     Head: Normocephalic and atraumatic.  Eyes:     Pupils: Pupils are equal, round, and reactive to light.  Neck:     Musculoskeletal: Normal range of motion.  Cardiovascular:     Rate and Rhythm: Normal rate and regular rhythm.     Heart sounds: Normal heart sounds.   Pulmonary:     Effort: Pulmonary effort is normal.     Breath sounds: Normal breath sounds.  Abdominal:     General: Bowel sounds are normal.     Palpations: Abdomen is soft.  Skin:    General: Skin is warm and dry.  Neurological:     Mental Status: She is alert and oriented to person, place, and time.      CMP Latest Ref Rng & Units 07/27/2018  Glucose 70 - 99 mg/dL 90  BUN 8 - 23 mg/dL 41(H)  Creatinine 0.44 - 1.00 mg/dL 1.77(H)  Sodium 135 - 145 mmol/L 139  Potassium 3.5 - 5.1 mmol/L 4.4  Chloride 98 - 111 mmol/L 103  CO2 22 - 32 mmol/L 25  Calcium 8.9 - 10.3 mg/dL 8.5(L)  Total Protein 6.5 - 8.1 g/dL 6.0(L)  Total Bilirubin 0.3 - 1.2 mg/dL 1.0  Alkaline Phos 38 - 126 U/L 55  AST 15 - 41 U/L 40  ALT 0 - 44 U/L 25   CBC Latest Ref Rng & Units 07/27/2018  WBC 4.0 - 10.5 K/uL 13.6(H)  Hemoglobin 12.0 - 15.0 g/dL 6.9(L)  Hematocrit 36.0 - 46.0 % 20.9(L)  Platelets 150 - 400 K/uL 98(L)    No images are attached to the encounter.  Dg Chest Port 1 View  Result Date: 06/29/2018 CLINICAL DATA:  Shortness of breath and productive cough, history of ovarian carcinoma, initial encounter EXAM: PORTABLE CHEST 1 VIEW COMPARISON:  05/07/2018 FINDINGS: Cardiac shadows within normal limits. Right-sided chest wall port is noted. Lungs are well aerated  bilaterally with diffuse increased interstitial changes noted bilaterally. Mild central vascular congestion is noted. No focal infiltrate is seen. No acute bony abnormality is noted. Postsurgical changes are noted in the cervical spine. IMPRESSION: Increased interstitial changes likely related to pulmonary edema. No focal infiltrate is noted. Electronically Signed   By: Inez Catalina M.D.   On: 06/29/2018 21:47     Assessment and plan- Patient is a 63 y.o. female with recurrent high-grade serous carcinoma of the ovary currently on single agent gemcitabine.  She is here for consideration for cycle 6-day 1 of gemcitabine chemotherapy  White cell  count is elevated at 13.6.  No signs and symptoms of infection.  Platelets are mildly low at 98.  However she is severely anemic with a hemoglobin of 6.9.  Discussed her case with Dr. Grayland Ormond and plan is to proceed with cycle 6-day 1 of gemcitabine chemotherapy today.  We will plan to give her 1 unit of blood transfusion later this week.    She will return to clinic in 1 week's time with CBC with differential, CMP and see Dr. Grayland Ormond for cycle 6-day 8 of gemcitabine chemotherapy.  I will add ferritin and iron studies as well as B12 and folate to next set of labs.  Chemo-induced peripheral neuropathy: Continue Neurontin with consideration for acupuncture down the line   Visit Diagnosis 1. Encounter for antineoplastic chemotherapy   2. Antineoplastic chemotherapy induced anemia   3. Chemotherapy-induced peripheral neuropathy ([place])      Dr. Randa Evens, MD, MPH [facility] [phone] 07/27/2018 10:49 AM

## 2018-07-27 NOTE — Addendum Note (Signed)
Addended by: Luella Cook on: 07/27/2018 04:08 PM   Modules accepted: Orders

## 2018-07-27 NOTE — Progress Notes (Signed)
Reviewed labs with MD, ok to proceed

## 2018-07-28 LAB — CA 125: Cancer Antigen (CA) 125: 75.4 U/mL — ABNORMAL HIGH (ref 0.0–38.1)

## 2018-07-29 ENCOUNTER — Other Ambulatory Visit: Payer: Self-pay

## 2018-07-29 ENCOUNTER — Inpatient Hospital Stay: Payer: PPO

## 2018-07-29 ENCOUNTER — Inpatient Hospital Stay: Payer: PPO | Attending: Obstetrics and Gynecology | Admitting: Obstetrics and Gynecology

## 2018-07-29 VITALS — BP 163/81 | HR 79 | Ht 62.0 in | Wt 165.6 lb

## 2018-07-29 DIAGNOSIS — Z90710 Acquired absence of both cervix and uterus: Secondary | ICD-10-CM | POA: Insufficient documentation

## 2018-07-29 DIAGNOSIS — G893 Neoplasm related pain (acute) (chronic): Secondary | ICD-10-CM | POA: Insufficient documentation

## 2018-07-29 DIAGNOSIS — R911 Solitary pulmonary nodule: Secondary | ICD-10-CM | POA: Diagnosis not present

## 2018-07-29 DIAGNOSIS — Z5111 Encounter for antineoplastic chemotherapy: Secondary | ICD-10-CM | POA: Diagnosis not present

## 2018-07-29 DIAGNOSIS — C569 Malignant neoplasm of unspecified ovary: Secondary | ICD-10-CM | POA: Diagnosis not present

## 2018-07-29 DIAGNOSIS — Z90722 Acquired absence of ovaries, bilateral: Secondary | ICD-10-CM | POA: Insufficient documentation

## 2018-07-29 DIAGNOSIS — D649 Anemia, unspecified: Secondary | ICD-10-CM

## 2018-07-29 DIAGNOSIS — N898 Other specified noninflammatory disorders of vagina: Secondary | ICD-10-CM | POA: Insufficient documentation

## 2018-07-29 MED ORDER — ACETAMINOPHEN 325 MG PO TABS
ORAL_TABLET | ORAL | Status: AC
  Filled 2018-07-29: qty 2

## 2018-07-29 MED ORDER — HEPARIN SOD (PORK) LOCK FLUSH 100 UNIT/ML IV SOLN: 500.0000 [IU] | Freq: Every day | INTRAVENOUS | Status: DC | PRN

## 2018-07-29 MED ORDER — SODIUM CHLORIDE 0.9% FLUSH
10.0000 mL | INTRAVENOUS | Status: AC | PRN
  Administered 2018-07-29: 10 mL
  Filled 2018-07-29: qty 10

## 2018-07-29 MED ORDER — SODIUM CHLORIDE 0.9% IV SOLUTION
250.0000 mL | Freq: Once | INTRAVENOUS | Status: AC
  Administered 2018-07-29: 250 mL via INTRAVENOUS
  Filled 2018-07-29: qty 250

## 2018-07-29 MED ORDER — ACETAMINOPHEN 325 MG PO TABS
650.0000 mg | ORAL_TABLET | Freq: Once | ORAL | Status: AC
  Administered 2018-07-29: 12:00:00 650 mg via ORAL

## 2018-07-29 NOTE — Progress Notes (Signed)
Gynecologic Oncology Interval Visit   Referring Provider: Dr. Grayland Ormond  Chief Concern: stage IIIC ovarian cancer  Subjective:  Jessica Burgess is a 63 y.o. female, initially seen in consultation for Dr. Grayland Ormond, diagnosed with platinum-resistant high grade serous ovarian cancer who returns to clinic today for follow up.   Since she was last seen she started on single agent gemcitabine on 02/19/2018 and is status post day 8 cycle 6 on 07/27/2018. She has required blood transfusion for anemia per her report.   CT A/P 02/02/2018 prior to starting gemcitabine IMPRESSION: 1. Mild increase in nodularity along the left upper quadrant omentum compatible with mild progression. 2. Prior nodularity along inferior pulmonary ligament has resolved and is thought to of been benign. Chronic right middle lobe subpleural nodule likewise probably benign although merit surveillance.   CA-125 has been followed as below.   253.0 02/02/2018 300 02/19/2018 301 02/26/2018 201 03/05/2018 74.9 03/23/2018 73.3  03/30/2018 68.5 04/06/2018 44.2 04/20/2018 52.9 04/27/2018  Today, she reports vaginal discharge. Denies pain or bleeding. She continues to feel weak and tired. She presents today for a pelvic exam.    Gynecologic Oncology History:  Jessica Burgess initially presented to Dr. Grayland Ormond on 10/21/16 with several month history of LLQ pain.    10/25/16:   CA-125 of 1,902; CEA of 2.2 (N), repeat CA125 3,257.  10/25/16:  CTAP -- Omental caking, greatest within the pelvis, highly suspicious for metastatic disease. No definite primary tumor identified. Possible tiny peritoneal nodules. Small amount of free pelvic fluid.  11/05/16: Diagnostic laparoscopy with biopsies.  Fagotti score 4. Path revealed metastatic high grade serous carcinoma. Decision was made to proceed with [employer] study and primary debulking surgery.  11/11/16: Pre-surgical dose of Pembrolizumab 259m IV   11/19/16: Exploratory laparotomy,  bilateral salpingo-oopherectomy, omentectomy, tumor debulking, liver moblization, and argon beam ablation of tumor nodules on the right diaphragm and posterior cul-de-sac.  At the completion of the case, optimal debulking was achieved with all tumor nodules less than 1 cm in diameter.  Final pathology confirmed high grade serous adenocarcinoma involving the omentum, bilateral tubes, pelvic peritoneum, sigmoid nodule.  STIC in the right fallopian tube.   12/01/2016 Post op course complicated by abdominal wall seroma.  CT shows new moderate amount of free fluid within the pelvis. There is no loculated or encapsulated fluid collection. There are multiple small fluid collections in the anterior subcutaneous tissues. Representative areas measure 2.0 x 5.5 cm (series 2, image 71) and 2.2 x 3.4 cm (series 2, image 60).   Wound separation on 12/01/16 in ER_wet to dry dressing_healing via secondary intention.   12/05/2016- mediport placed  She was also initiated on a clinical trial and has received preoperative infusion of Keytruda on November 11, 2016 and then started carbo/taxol.  Because of patient's persistent peripheral neuropathy as well as pancytopenia, Taxol was discontinued on April 28, 2017.  CT scan from June 19, 2017 essentially unchanged from previous scan in January with some residual carcinomatosis and CA125 still elevated. Stopped after 9 cycles in May (last 2 cycles with single agent Carbo).    CA 125 49.7     06/23/17 136.9   07/21/17 179.7   07/31/17 179.0 09/01/17  Then switched to Doxil/Avastin in June 2019.  CA 125 114.0 11/10/17 122.0 11/21/17 105.0 12/08/17 129.0 01/07/18  Doxil/Avastin was discontinued due to declining performance status and toxicity. She has had mucositis, hand/foot, peripheral neuropathy, anorexia, vaginal discharge with retained cervix, and recent ER visit for pneumonia  which prompted chemotherapy holiday.   She reported abdominal pains intermittently with  continued vaginal discharge and spotting when seen in clinic on 01/28/2018 with Dr. Fransisca Connors. Discussed GY005 trial with cediranib and olaparib vs gemcitabine. She opted for gemcitabine. She started on single agent gemcitabine on 02/19/2018.  Genetic Risk Assessment:  Genetic testing with [employer] MyRisk negative.  [employer] MyChoice was negative for BRCA1 or BRCA2 mutations in tumor. Myriad Genomic Instability- negative  Problem List: Patient Active Problem List   Diagnosis Date Noted  . 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 Mucositis due to chemotherapy 09/02/2017  . Goals of care, counseling/discussion 08/08/2017  . Hypothyroidism due to medication 07/28/2017  . Genetic testing 12/18/2016  . Migraines 12/01/2016  . Postoperative seroma of subcutaneous tissue after non-dermatologic procedure 12/01/2016  . Transaminitis 12/01/2016  . Anemia associated with acute blood loss 11/20/2016  . Ovarian cancer, unspecified laterality ([place]) 11/12/2016  . 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[place]) 11/05/2016  . Examination of participant in clinical trial 11/01/2016  . Chronic right-sided low back pain with right-sided sciatica 02/02/2016  . History of neck surgery 08/01/2015  . Hematoma 06/14/2015  . Perennial allergic rhinitis 05/05/2015  . Generalized anxiety disorder 02/03/2015  . Back pain, chronic 08/25/2014  . Insomnia, persistent 08/25/2014  . Chronic cervical pain 08/25/2014  . Major depression, chronic ([place]) 08/25/2014  . Dyslipidemia 08/25/2014  . Gastro-esophageal reflux disease without esophagitis 08/25/2014  . H/O high risk medication treatment 08/25/2014  . Blood glucose elevated 08/25/2014  . Migraine without aura and without status migrainosus, not intractable 08/25/2014  . Climacteric 08/25/2014  . Dysmetabolic syndrome 41/74/0814  . Fungal infection of toenail 08/25/2014  . Obesity (BMI 30.0-34.9) 08/25/2014  . Vitamin D deficiency 08/25/2014  . Engages in  travel abroad 08/25/2014  . Cervical disc disorder with radiculopathy 04/28/2013   Past Medical History: Past Medical History:  Diagnosis Date  . Allergic rhinitis, cause unspecified   . Anxiety state, unspecified   . Arthritis   . Asthma    only when sick   . Backache, unspecified   . Bronchitis    hx of when get sick  . Cancer ([place])    skin cancer , basal cell   . Cancer ([place]) 11/2016   ovarian  . Cervicalgia   . Complication of anesthesia   . Dermatophytosis of nail   . Dysmetabolic syndrome X   . Encounter for long-term (current) use of other medications   . Esophageal reflux   . Insomnia, unspecified   . Leukocytosis, unspecified   . Migraine without aura, without mention of intractable migraine without mention of status migrainosus   . Other and unspecified hyperlipidemia   . Other malaise and fatigue   . Overweight(278.02)   . Personal history of chemotherapy now   ovarian  . PONV (postoperative nausea and vomiting)   . Spinal stenosis in cervical region   . Symptomatic menopausal or female climacteric states   . Unspecified disorder of skin and subcutaneous tissue   . Unspecified vitamin D deficiency    Past Surgical History: Past Surgical History:  Procedure Laterality Date  . ABDOMINAL HYSTERECTOMY    . ANTERIOR CERVICAL DECOMP/DISCECTOMY FUSION N/A 06/07/2015   Procedure: Cervical three - four and Cervical six- seven anterior cervical decompression with fusion interbody prosthesis plating and bonegraft;  Surgeon: Newman Pies, MD;  Location: [facility];  Service: Neurosurgery;  Laterality: N/A;  C34 and C67 anterior cervical decompression with fusion interbody prosthesis plating  and bonegraft  . BACK SURGERY     x2 Lower   . EVACUATION OF CERVICAL HEMATOMA N/A 06/14/2015   Procedure: EVACUATION OF CERVICAL HEMATOMA;  Surgeon: Newman Pies, MD;  Location: [facility];  Service: Neurosurgery;  Laterality: N/A;  . NECK SURGERY     x3  . TONSILLECTOMY      OB History:  OB History  Gravida Para Term Preterm AB Living  0            SAB TAB Ectopic Multiple Live Births              Family History: Family History  Problem Relation Age of Onset  . Depression Mother   . Migraines Mother   . Dementia Father   . Diabetes Father   . Hyperlipidemia Father   . Hyperlipidemia Brother   . Hyperlipidemia Brother   . Breast cancer Paternal Aunt        24s   Social History: Social History   Socioeconomic History  . Marital status: Single    Spouse name: Not on file  . Number of children: 0  . Years of education: some college  . Highest education level: 12th grade  Occupational History    Employer: DISABLED  . Occupation: Disabled   Social Needs  . Financial resource strain: Very hard  . Food insecurity:    Worry: Never true    Inability: Never true  . Transportation needs:    Medical: No    Non-medical: No  Tobacco Use  . Smoking status: Former Smoker    Packs/day: 1.50    Years: 20.00    Pack years: 30.00    Types: Cigarettes    Start date: 03/19/1979    Last attempt to quit: 08/25/1999    Years since quitting: 18.9  . Smokeless tobacco: Never Used  . Tobacco comment: smoking cessation materials not required  Substance and Sexual Activity  . Alcohol use: Not Currently    Alcohol/week: 0.0 standard drinks  . Drug use: No  . Sexual activity: Never  Lifestyle  . Physical activity:    Days per week: 0 days    Minutes per session: 0 min  . Stress: Very much  Relationships  . Social connections:    Talks on phone: Patient refused    Gets together: Patient refused    Attends religious service: Patient refused    Active member of club or organization: Patient refused    Attends meetings of clubs or organizations: Patient refused    Relationship status: Patient refused  . Intimate partner violence:    Fear of current or ex partner: No    Emotionally abused: No    Physically abused: No    Forced sexual activity: No   Other Topics Concern  . Not on file  Social History Narrative   Patient is single.    Patient lives with roommates.    Patient on disability    Patient has no children.    Patient has some college     Allergies: No Known Allergies  Current Medications: Current Outpatient Medications  Medication Sig Dispense Refill  . albuterol (PROVENTIL HFA;VENTOLIN HFA) 108 (90 Base) MCG/ACT inhaler Inhale 2 puffs into the lungs every 6 (six) hours as needed for wheezing or shortness of breath. 1 Inhaler 2  . albuterol (PROVENTIL) (2.5 MG/3ML) 0.083% nebulizer solution Take 3 mLs (2.5 mg total) by nebulization every 6 (six) hours as needed for wheezing or shortness of breath. 75  mL 2  . Ascorbic Acid (VITAMIN C PO) Take 1 tablet by mouth daily.     . budesonide-formoterol (SYMBICORT) 160-4.5 MCG/ACT inhaler Inhale 2 puffs into the lungs 2 (two) times daily.    . Cholecalciferol (VITAMIN D-1000 MAX ST) 1000 UNITS tablet Take 1 tablet by mouth 2 (two) times daily.    . Cyanocobalamin (B-12 PO) Take 1 tablet by mouth daily.     . cyclobenzaprine (FLEXERIL) 10 MG tablet Take one tablet every 8 hours as needed 30 tablet 1  . DULoxetine (CYMBALTA) 60 MG capsule TAKE 1 CAPSULE(60 MG) BY MOUTH DAILY 180 capsule 1  . estradiol (ESTRACE) 0.5 MG tablet Take 1 tablet (0.5 mg total) by mouth daily. 90 tablet 4  . ferrous sulfate (IRON SUPPLEMENT) 325 (65 FE) MG tablet Take 1 tablet by mouth daily.    . fluticasone (FLONASE) 50 MCG/ACT nasal spray Place 2 sprays into both nostrils as needed. 48 g 1  . gabapentin (NEURONTIN) 300 MG capsule Take 1-3 capsules (300-900 mg total) by mouth 3 (three) times daily. 300 mg twice a day and 900 mg at night 540 capsule 1  . ipratropium (ATROVENT HFA) 17 MCG/ACT inhaler Inhale 2 puffs into the lungs every 6 (six) hours as needed.     Marland Kitchen levothyroxine (SYNTHROID) 25 MCG tablet Take 1 tablet (25 mcg total) by mouth daily before breakfast. 30 tablet 2  . lidocaine (XYLOCAINE) 2 %  solution Use as directed 15 mLs in the mouth or throat as needed for mouth pain. 100 mL 0  . Lysine 1000 MG TABS Take 1 tablet by mouth daily.     . Magnesium Oxide 500 MG CAPS Take 500 mg by mouth 2 (two) times daily.     . Multiple Vitamins-Minerals (MULTIVITAMIN PO) Take 1 tablet by mouth daily.     . Omega-3 Fatty Acids (FISH OIL PO) Take 1 capsule by mouth 2 (two) times daily.     . ondansetron (ZOFRAN-ODT) 8 MG disintegrating tablet Take 1 tablet (8 mg total) by mouth every 8 (eight) hours as needed for nausea or vomiting. 60 tablet 3  . oxyCODONE-acetaminophen (PERCOCET) 10-325 MG tablet Take 1 tablet by mouth every 6 (six) hours as needed for pain. 120 tablet 0  . potassium gluconate 595 MG TABS tablet Take 595 mg by mouth daily.    . predniSONE (DELTASONE) 10 MG tablet Five tablets x 1 day, then four tablets x 1 day, then three tablets x 1 day, then two tablets x 1 day, then one tablet x 1 day, then stop (Patient not taking: Reported on 07/27/2018) 15 tablet 0  . SUMAtriptan (IMITREX) 100 MG tablet May repeat in 2 hours if headache persists or recurs. 9 tablet 0  . temazepam (RESTORIL) 15 MG capsule TAKE 1 CAPSULE(15 MG) BY MOUTH AT BEDTIME AS NEEDED FOR SLEEP 30 capsule 2  . triamcinolone (KENALOG) 0.1 % paste Use as directed 1 application in the mouth or throat 2 (two) times daily. 5 g 2  . urea (CARMOL) 10 % cream Apply topically as needed. 410 g 1  . valACYclovir (VALTREX) 1000 MG tablet TAKE 1 TABLET(1000 MG) BY MOUTH THREE TIMES DAILY FOR 7 DAYS (Patient not taking: Reported on 07/27/2018) 21 tablet 0   No current facility-administered medications for this visit.    Facility-Administered Medications Ordered in Other Visits  Medication Dose Route Frequency Provider Last Rate Last Dose  . 0.9 %  sodium chloride infusion   Intravenous Once Delight Hoh  J, MD      . 0.9 %  sodium chloride infusion   Intravenous Once Lloyd Huger, MD      . dexamethasone (DECADRON) 20 mg in  sodium chloride 0.9 % 50 mL IVPB  20 mg Intravenous Once Lloyd Huger, MD      . dexamethasone (DECADRON) injection 10 mg  10 mg Intravenous Once Lloyd Huger, MD      . sodium chloride flush (NS) 0.9 % injection 10 mL  10 mL Intravenous PRN Lloyd Huger, MD   10 mL at 06/16/17 0854    Review of Systems General: Fatigue.  Skin: no complaints Eyes: no complaints HEENT: no complaints Breasts: no complaints Pulmonary: SOB on exertion.  Cardiac: no complaints Gastrointestinal: no complaints Genitourinary/Sexual: no complaints Ob/Gyn: no complaints Musculoskeletal: History of Back pain (chronic) Hematology: no complaints Neurologic/Psych: History of Numbness and tingling (chronic)   Objective:  Physical Examination:  There were no vitals taken for this visit.  ECOG Performance Status: 1 - Symptomatic but completely ambulatory  GENERAL: moderately built, fatigued appearing female. Appears stated age.  HEENT:  Sclera clear. Anicteric Abdominal exam: soft ND NT no ascites or masses. No hepatosplenomegaly.  NEURO:  Nonfocal. Well oriented.  Appropriate affect  Pelvic: Exam chaperoned by CMA: Vulva-normal-appearing without masses, tenderness or lesions.  Vagina: Normal vagina without discharge: Cervix: Normal appearing.  Bimanual/RV: cervix palpated. Nodularity noted involving left paracervical tissue has improved and on the right there is soft tissue previously measuring 2-3 cm is no longer present.     Assessment:  Jessica Burgess is a 64 y.o. female diagnosed with IIIC high grade serous ovarian cancer, s/p diagnostic laparoscopy and omental biopsy on 11/05/16. Single infusion of Pembrolizumab 260m 11/11/16 on [employer] trial.  Optimal tumor debulking 11/19/16 and [facility]([place].  Had persistent disease based on CT scan and CA125 at completion of primary therapy.  Transitioned to Doxil/avastin due to poor  PS and toxicity. Gemcitabine initiated on 02/19/2018 due to progressive disease with evidence of response based on CA125. Exam today is very reassuring.   [facility]. Genetic testing with [facility] panel negative for BRCA1/2 mutations or HRD.  Medical co-morbidities complicating care: hyperlipidemia, extensive spinal surgery, anxiety/depression. Plan:   Problem List Items Addressed This Visit      Endocrine   Ovarian [place], unspecified [place]ality [place]     Continue to follow with Dr FGrayland Ormondand gemcitabine therapy.   We are happy to see her back in about 3 months or sooner if needed.   LBeckey Rutter DNP, AGNP-C CMojave Ranch Estatesat [facility] [phone](work cell) [phone](office)  I personally had a face to face interaction and evaluated the patient jointly with the NP, Ms. LBeckey Rutter  I have reviewed her history and available records and have performed the key portions of the physical exam including general, HEENT, abdominal exam, pelvic exam with my findings confirming those documented above by the APP.  I have discussed the case with the APP and the patient.  I agree with the above documentation, assessment and plan which was fully formulated by me.  Counseling was completed by me.   I personally saw the patient and performed this encounter in conjunction with the listed APP as documented above.  A total of 15 minutes were spent with the patient/family today; >50% was spent in education, counseling and coordination  of care for ovarian cancer.  Avnoor Koury Gaetana Michaelis, MD   CC:  Dr. Grayland Ormond

## 2018-07-30 LAB — TYPE AND SCREEN
ABO/RH(D): B POS
Antibody Screen: NEGATIVE
Unit division: 0

## 2018-07-30 LAB — BPAM RBC
Blood Product Expiration Date: 202005212359
ISSUE DATE / TIME: 202005131145
Unit Type and Rh: 7300

## 2018-08-01 NOTE — Progress Notes (Signed)
[place]  [phone] [phone]  ID: Jessica Burgess OB: Jan 06, 1956  MR#: [id number]  ELF#:[id number]  Patient Care Team: Steele Sizer, MD as PCP - General (Family Medicine) Lloyd Huger, MD as Consulting Physician (Oncology) Mellody Drown, MD as Consulting Physician (Obstetrics and Gynecology) Cathi Roan, [facility] (Pharmacist) Benedetto Goad, RN as Case Manager Clent Jacks, RN as Registered Nurse  CHIEF COMPLAINT: Stage IIIc high-grade serous ovarian carcinoma  INTERVAL HISTORY: Patient returns to clinic today for further evaluation and consideration of cycle 6, day 8 of single agent gemcitabine.  She has worsening weakness and fatigue, increased edema, and poor appetite.  She continues to have increased shortness of breath.  She denies any recent fevers or illnesses.  She continues to have a mild peripheral neuropathy that is chronic and unchanged. She has no other neurologic complaints. She denies any chest pain, cough, or hemoptysis.  She denies any nausea, vomiting, constipation, or diarrhea.  She denies any abdominal pain or bloating.  She has no urinary complaints.  Patient feels generally terrible, but offers no further specific complaints today.  REVIEW OF SYSTEMS:   Review of Systems  Constitutional: Positive for malaise/fatigue. Negative for fever and weight loss.  HENT: Negative.  Negative for sore throat.   Eyes: Negative.  Negative for blurred vision.  Respiratory: Positive for shortness of breath. Negative for cough.   Cardiovascular: Positive for leg swelling. Negative for chest pain.  Gastrointestinal: Negative.  Negative for abdominal pain, blood in stool, constipation, diarrhea, melena, nausea and vomiting.  Genitourinary: Negative.  Negative for dysuria.  Musculoskeletal: Positive for back pain and joint pain. Negative for neck pain.  Skin: Negative.  Negative for itching and rash.  Neurological:  Positive for tingling, sensory change and weakness. Negative for focal weakness.  Psychiatric/Behavioral: Negative.  The patient is not nervous/anxious.     As per HPI. Otherwise, a complete review of systems is negative.  PAST MEDICAL HISTORY: Past Medical History:  Diagnosis Date  . Allergic rhinitis, cause unspecified   . Anxiety state, unspecified   . Arthritis   . Asthma    only when sick   . Backache, unspecified   . Bronchitis    hx of when get sick  . Cancer ([place])    skin cancer , basal cell   . Cancer ([place]) 11/2016   ovarian  . Cervicalgia   . Complication of anesthesia   . Dermatophytosis of nail   . Dysmetabolic syndrome X   . Encounter for long-term (current) use of other medications   . Esophageal reflux   . Insomnia, unspecified   . Leukocytosis, unspecified   . Migraine without aura, without mention of intractable migraine without mention of status migrainosus   . Other and unspecified hyperlipidemia   . Other malaise and fatigue   . Overweight(278.02)   . Personal history of chemotherapy now   ovarian  . PONV (postoperative nausea and vomiting)   . Spinal stenosis in cervical region   . Symptomatic menopausal or female climacteric states   . Unspecified disorder of skin and subcutaneous tissue   . Unspecified vitamin D deficiency     PAST SURGICAL HISTORY: Past Surgical History:  Procedure Laterality Date  . ABDOMINAL HYSTERECTOMY    . ANTERIOR CERVICAL DECOMP/DISCECTOMY FUSION N/A 06/07/2015   Procedure: Cervical three - four and Cervical six- seven anterior cervical decompression with fusion interbody prosthesis plating and bonegraft;  Surgeon: Newman Pies, MD;  Location: [facility];  Service: Neurosurgery;  Laterality: N/A;  C34 and C67 anterior cervical decompression with fusion interbody prosthesis plating and bonegraft  . BACK SURGERY     x2 Lower   . EVACUATION OF CERVICAL HEMATOMA N/A 06/14/2015   Procedure: EVACUATION OF CERVICAL  HEMATOMA;  Surgeon: Newman Pies, MD;  Location: [facility];  Service: Neurosurgery;  Laterality: N/A;  . NECK SURGERY     x3  . TONSILLECTOMY      FAMILY HISTORY: Family History  Problem Relation Age of Onset  . Depression Mother   . Migraines Mother   . Dementia Father   . Diabetes Father   . Hyperlipidemia Father   . Hyperlipidemia Brother   . Hyperlipidemia Brother   . Breast cancer Paternal Aunt        78s    ADVANCED DIRECTIVES (Y/N):  N  HEALTH MAINTENANCE: Social History   Tobacco Use  . Smoking status: Former Smoker    Packs/day: 1.50    Years: 20.00    Pack years: 30.00    Types: Cigarettes    Start date: 03/19/1979    Last attempt to quit: 08/25/1999    Years since quitting: 18.9  . Smokeless tobacco: Never Used  . Tobacco comment: smoking cessation materials not required  Substance Use Topics  . Alcohol use: Not Currently    Alcohol/week: 0.0 standard drinks  . Drug use: No     Colonoscopy:  PAP:  Bone density:  Lipid panel:  No Known Allergies  Current Outpatient Medications  Medication Sig Dispense Refill  . albuterol (PROVENTIL HFA;VENTOLIN HFA) 108 (90 Base) MCG/ACT inhaler Inhale 2 puffs into the lungs every 6 (six) hours as needed for wheezing or shortness of breath. 1 Inhaler 2  . albuterol (PROVENTIL) (2.5 MG/3ML) 0.083% nebulizer solution Take 3 mLs (2.5 mg total) by nebulization every 6 (six) hours as needed for wheezing or shortness of breath. 75 mL 2  . Ascorbic Acid (VITAMIN C PO) Take 1 tablet by mouth daily.     . budesonide-formoterol (SYMBICORT) 160-4.5 MCG/ACT inhaler Inhale 2 puffs into the lungs 2 (two) times daily.    . Cholecalciferol (VITAMIN D-1000 MAX ST) 1000 UNITS tablet Take 1 tablet by mouth 2 (two) times daily.    . Cyanocobalamin (B-12 PO) Take 1 tablet by mouth daily.     . DULoxetine (CYMBALTA) 60 MG capsule TAKE 1 CAPSULE(60 MG) BY MOUTH DAILY 180 capsule 1  . estradiol (ESTRACE) 0.5 MG tablet Take 1 tablet (0.5  mg total) by mouth daily. 90 tablet 4  . ferrous sulfate (IRON SUPPLEMENT) 325 (65 FE) MG tablet Take 1 tablet by mouth daily.    . fluticasone (FLONASE) 50 MCG/ACT nasal spray Place 2 sprays into both nostrils as needed. 48 g 1  . gabapentin (NEURONTIN) 300 MG capsule Take 1-3 capsules (300-900 mg total) by mouth 3 (three) times daily. 300 mg twice a day and 900 mg at night 540 capsule 1  . ipratropium (ATROVENT HFA) 17 MCG/ACT inhaler Inhale 2 puffs into the lungs every 6 (six) hours as needed.     Marland Kitchen levothyroxine (SYNTHROID) 25 MCG tablet Take 1 tablet (25 mcg total) by mouth daily before breakfast. 30 tablet 2  . lidocaine (XYLOCAINE) 2 % solution Use as directed 15 mLs in the mouth or throat as needed for mouth pain. 100 mL 0  . Lysine 1000 MG TABS Take 1 tablet by mouth daily.     . Magnesium Oxide 500 MG CAPS  Take 500 mg by mouth 2 (two) times daily.     . Multiple Vitamins-Minerals (MULTIVITAMIN PO) Take 1 tablet by mouth daily.     . Omega-3 Fatty Acids (FISH OIL PO) Take 1 capsule by mouth 2 (two) times daily.     . ondansetron (ZOFRAN-ODT) 8 MG disintegrating tablet Take 1 tablet (8 mg total) by mouth every 8 (eight) hours as needed for nausea or vomiting. 60 tablet 3  . oxyCODONE-acetaminophen (PERCOCET) 10-325 MG tablet Take 1 tablet by mouth every 6 (six) hours as needed for pain. 120 tablet 0  . potassium gluconate 595 MG TABS tablet Take 595 mg by mouth daily.    . SUMAtriptan (IMITREX) 100 MG tablet May repeat in 2 hours if headache persists or recurs. 9 tablet 0  . temazepam (RESTORIL) 15 MG capsule TAKE 1 CAPSULE(15 MG) BY MOUTH AT BEDTIME AS NEEDED FOR SLEEP 30 capsule 2  . cyclobenzaprine (FLEXERIL) 10 MG tablet Take one tablet every 8 hours as needed 30 tablet 3  . valACYclovir (VALTREX) 1000 MG tablet TAKE 1 TABLET(1000 MG) BY MOUTH THREE TIMES DAILY FOR 7 DAYS (Patient not taking: Reported on 08/03/2018) 21 tablet 0   No current facility-administered medications for this  visit.    Facility-Administered Medications Ordered in Other Visits  Medication Dose Route Frequency Provider Last Rate Last Dose  . 0.9 %  sodium chloride infusion   Intravenous Once Lloyd Huger, MD      . 0.9 %  sodium chloride infusion   Intravenous Once Lloyd Huger, MD      . dexamethasone (DECADRON) 20 mg in sodium chloride 0.9 % 50 mL IVPB  20 mg Intravenous Once Lloyd Huger, MD      . dexamethasone (DECADRON) injection 10 mg  10 mg Intravenous Once Lloyd Huger, MD      . sodium chloride flush (NS) 0.9 % injection 10 mL  10 mL Intravenous PRN Lloyd Huger, MD   10 mL at 06/16/17 0854    OBJECTIVE: Vitals:   08/03/18 0852  BP: 117/68  Pulse: 83  Resp: 18  Temp: (!) 96.5 F (35.8 C)     Body mass index is 31.64 kg/m.    ECOG FS:0 - Asymptomatic  General: Ill-appearing, no acute distress. Eyes: Pink conjunctiva, anicteric sclera. HEENT: Normocephalic, moist mucous membranes. Lungs: Clear to auscultation bilaterally. Heart: Regular rate and rhythm. No rubs, murmurs, or gallops. Abdomen: Soft, nontender, nondistended. No organomegaly noted, normoactive bowel sounds. Musculoskeletal: 1-2+ bilateral peripheral edema. Neuro: Alert, answering all questions appropriately. Cranial nerves grossly intact. Skin: No rashes or petechiae noted. Psych: Normal affect.  LAB RESULTS:  Lab Results  Component Value Date   NA 129 (L) 08/03/2018   K 5.2 (H) 08/03/2018   CL 96 (L) 08/03/2018   CO2 24 08/03/2018   GLUCOSE 104 (H) 08/03/2018   BUN 58 (H) 08/03/2018   CREATININE 3.54 (H) 08/03/2018   CALCIUM 7.6 (L) 08/03/2018   PROT 5.7 (L) 08/03/2018   ALBUMIN 3.1 (L) 08/03/2018   AST 38 08/03/2018   ALT 26 08/03/2018   ALKPHOS 58 08/03/2018   BILITOT 0.8 08/03/2018   GFRNONAA 13 (L) 08/03/2018   GFRAA 15 (L) 08/03/2018    Lab Results  Component Value Date   WBC 5.5 08/03/2018   NEUTROABS 2.8 08/03/2018   HGB 5.9 (L) 08/03/2018   HCT 17.5  (L) 08/03/2018   MCV 109.4 (H) 08/03/2018   PLT 39 (L) 08/03/2018  STUDIES: No results found.  ONCOLOGY HISTORY: Patient underwent debulking surgery at [facility] on November 19, 2016. She received one infusion preoperative infusion of Keytruda on November 11, 2016 as part of a clinical trial.  She initiated adjuvant carboplatinum and Taxol on December 16, 2016.  This was continued through April 28, 2017 at which time Taxol was discontinued secondary to worsening neuropathy.  Patient then received single agent carboplatinum for additional 2 infusions.  She was then noted to have progression of disease and was switched to Avastin and doxorubicin on August 18, 2017 this was discontinued on November 24, 2017 secondary to declining performance status.  Palliative gemcitabine was initiated on February 20, 2018.   ASSESSMENT: Stage IIIc high-grade serous ovarian carcinoma  PLAN:  1. Stage IIIc high-grade serous ovarian carcinoma: See oncology history as above.  PET scan results from May 07, 2018 reviewed independently with significant improvement in disease burden.  Patient CA-125 appears to be erratic ranging from 40s to 60s, although her most recent result is trending up to 75.4.  Patient has inquired about intraperitoneal chemotherapy, but the benefit is unclear with increased toxicity.  Given patient's profound anemia and acute renal failure, will delay cycle 6, day 8 and proceed with 2 units of packed red blood cells.  Return to clinic in 1 week for further evaluation and reconsideration of treatment.  Will consider reimaging at the conclusion of cycle 6. 2. Pain: Well controlled.  Continue oxycodone as needed.   3. Anxiety: Chronic and unchanged.  Continue Xanax as prescribed. 4.  Peripheral neuropathy: Chronic and unchanged.  Continue gabapentin as prescribed.  Patient has agreed to acupuncture.   5.  Anemia: Proceed with 2 units packed red blood cells as above.   6.  Shoulder pain:  Patient does not complain of this today.  Continue cortisone injections as needed.  Patient is also indicated she may need rotator cuff surgery in the future.  Okay to proceed from an oncology standpoint, but will have to be coordinated with chemotherapy. 7.  Thrombocytopenia: Secondary to gemcitabine.  Delay treatment as above.   8.  Leukocytosis: Resolved. 9.  Elevated liver enzymes: AST and ALT are now within normal limits. 10.  Tooth pain: Patient does not complain of this today.  Continue follow-up with dentist as scheduled. 11.  Acute renal failure: Likely secondary to hypovolemia.  2 units packed red blood cells as above.  Return to clinic in 2 days for repeat laboratory work. 12.  Shortness of breath: Likely secondary to profound anemia.   Patient expressed understanding and was in agreement with this plan. She also understands that She can call clinic at any time with any questions, concerns, or complaints.   Cancer Staging Ovarian cancer, unspecified laterality ([place]) Staging form: Ovary, Fallopian Tube, and Primary Peritoneal Carcinoma, AJCC 8th Edition - Clinical: No stage assigned - Unsigned   Lloyd Huger, MD   08/04/2018 7:14 AM

## 2018-08-03 ENCOUNTER — Inpatient Hospital Stay (HOSPITAL_BASED_OUTPATIENT_CLINIC_OR_DEPARTMENT_OTHER): Payer: PPO | Admitting: Oncology

## 2018-08-03 ENCOUNTER — Inpatient Hospital Stay: Payer: PPO

## 2018-08-03 ENCOUNTER — Other Ambulatory Visit: Payer: Self-pay

## 2018-08-03 VITALS — BP 117/68 | HR 83 | Temp 96.5°F | Resp 18 | Ht 62.0 in | Wt 173.0 lb

## 2018-08-03 DIAGNOSIS — D6481 Anemia due to antineoplastic chemotherapy: Secondary | ICD-10-CM

## 2018-08-03 DIAGNOSIS — N179 Acute kidney failure, unspecified: Secondary | ICD-10-CM

## 2018-08-03 DIAGNOSIS — Z79899 Other long term (current) drug therapy: Secondary | ICD-10-CM | POA: Diagnosis not present

## 2018-08-03 DIAGNOSIS — Z5111 Encounter for antineoplastic chemotherapy: Secondary | ICD-10-CM | POA: Diagnosis present

## 2018-08-03 DIAGNOSIS — K0889 Other specified disorders of teeth and supporting structures: Secondary | ICD-10-CM

## 2018-08-03 DIAGNOSIS — I5043 Acute on chronic combined systolic (congestive) and diastolic (congestive) heart failure: Secondary | ICD-10-CM | POA: Diagnosis not present

## 2018-08-03 DIAGNOSIS — C569 Malignant neoplasm of unspecified ovary: Secondary | ICD-10-CM

## 2018-08-03 DIAGNOSIS — R630 Anorexia: Secondary | ICD-10-CM | POA: Diagnosis not present

## 2018-08-03 DIAGNOSIS — M25519 Pain in unspecified shoulder: Secondary | ICD-10-CM | POA: Diagnosis not present

## 2018-08-03 DIAGNOSIS — E872 Acidosis: Secondary | ICD-10-CM | POA: Diagnosis present

## 2018-08-03 DIAGNOSIS — Z9221 Personal history of antineoplastic chemotherapy: Secondary | ICD-10-CM | POA: Diagnosis not present

## 2018-08-03 DIAGNOSIS — I248 Other forms of acute ischemic heart disease: Secondary | ICD-10-CM | POA: Diagnosis present

## 2018-08-03 DIAGNOSIS — N184 Chronic kidney disease, stage 4 (severe): Secondary | ICD-10-CM | POA: Diagnosis not present

## 2018-08-03 DIAGNOSIS — M7989 Other specified soft tissue disorders: Secondary | ICD-10-CM | POA: Diagnosis not present

## 2018-08-03 DIAGNOSIS — G4700 Insomnia, unspecified: Secondary | ICD-10-CM

## 2018-08-03 DIAGNOSIS — T451X5A Adverse effect of antineoplastic and immunosuppressive drugs, initial encounter: Secondary | ICD-10-CM | POA: Diagnosis present

## 2018-08-03 DIAGNOSIS — R5381 Other malaise: Secondary | ICD-10-CM

## 2018-08-03 DIAGNOSIS — Z8543 Personal history of malignant neoplasm of ovary: Secondary | ICD-10-CM | POA: Diagnosis not present

## 2018-08-03 DIAGNOSIS — I340 Nonrheumatic mitral (valve) insufficiency: Secondary | ICD-10-CM | POA: Diagnosis not present

## 2018-08-03 DIAGNOSIS — R801 Persistent proteinuria, unspecified: Secondary | ICD-10-CM | POA: Diagnosis not present

## 2018-08-03 DIAGNOSIS — R069 Unspecified abnormalities of breathing: Secondary | ICD-10-CM | POA: Diagnosis not present

## 2018-08-03 DIAGNOSIS — D631 Anemia in chronic kidney disease: Secondary | ICD-10-CM | POA: Diagnosis present

## 2018-08-03 DIAGNOSIS — R531 Weakness: Secondary | ICD-10-CM | POA: Diagnosis not present

## 2018-08-03 DIAGNOSIS — E875 Hyperkalemia: Secondary | ICD-10-CM | POA: Diagnosis present

## 2018-08-03 DIAGNOSIS — D6959 Other secondary thrombocytopenia: Secondary | ICD-10-CM

## 2018-08-03 DIAGNOSIS — R5383 Other fatigue: Secondary | ICD-10-CM

## 2018-08-03 DIAGNOSIS — I509 Heart failure, unspecified: Secondary | ICD-10-CM | POA: Diagnosis not present

## 2018-08-03 DIAGNOSIS — I5032 Chronic diastolic (congestive) heart failure: Secondary | ICD-10-CM | POA: Diagnosis not present

## 2018-08-03 DIAGNOSIS — D696 Thrombocytopenia, unspecified: Secondary | ICD-10-CM | POA: Diagnosis not present

## 2018-08-03 DIAGNOSIS — Z7951 Long term (current) use of inhaled steroids: Secondary | ICD-10-CM

## 2018-08-03 DIAGNOSIS — I10 Essential (primary) hypertension: Secondary | ICD-10-CM | POA: Diagnosis not present

## 2018-08-03 DIAGNOSIS — I501 Left ventricular failure: Secondary | ICD-10-CM | POA: Diagnosis not present

## 2018-08-03 DIAGNOSIS — Z85828 Personal history of other malignant neoplasm of skin: Secondary | ICD-10-CM

## 2018-08-03 DIAGNOSIS — I5041 Acute combined systolic (congestive) and diastolic (congestive) heart failure: Secondary | ICD-10-CM | POA: Diagnosis present

## 2018-08-03 DIAGNOSIS — I110 Hypertensive heart disease with heart failure: Secondary | ICD-10-CM | POA: Diagnosis not present

## 2018-08-03 DIAGNOSIS — D701 Agranulocytosis secondary to cancer chemotherapy: Secondary | ICD-10-CM

## 2018-08-03 DIAGNOSIS — Z87891 Personal history of nicotine dependence: Secondary | ICD-10-CM | POA: Diagnosis not present

## 2018-08-03 DIAGNOSIS — G620 Drug-induced polyneuropathy: Secondary | ICD-10-CM | POA: Diagnosis not present

## 2018-08-03 DIAGNOSIS — R042 Hemoptysis: Secondary | ICD-10-CM | POA: Diagnosis not present

## 2018-08-03 DIAGNOSIS — F419 Anxiety disorder, unspecified: Secondary | ICD-10-CM | POA: Diagnosis not present

## 2018-08-03 DIAGNOSIS — I5020 Unspecified systolic (congestive) heart failure: Secondary | ICD-10-CM | POA: Diagnosis not present

## 2018-08-03 DIAGNOSIS — R918 Other nonspecific abnormal finding of lung field: Secondary | ICD-10-CM | POA: Diagnosis not present

## 2018-08-03 DIAGNOSIS — G43909 Migraine, unspecified, not intractable, without status migrainosus: Secondary | ICD-10-CM | POA: Diagnosis present

## 2018-08-03 DIAGNOSIS — Z452 Encounter for adjustment and management of vascular access device: Secondary | ICD-10-CM | POA: Diagnosis not present

## 2018-08-03 DIAGNOSIS — N189 Chronic kidney disease, unspecified: Secondary | ICD-10-CM | POA: Diagnosis not present

## 2018-08-03 DIAGNOSIS — J441 Chronic obstructive pulmonary disease with (acute) exacerbation: Secondary | ICD-10-CM | POA: Diagnosis not present

## 2018-08-03 DIAGNOSIS — C482 Malignant neoplasm of peritoneum, unspecified: Secondary | ICD-10-CM | POA: Diagnosis present

## 2018-08-03 DIAGNOSIS — Z95828 Presence of other vascular implants and grafts: Secondary | ICD-10-CM | POA: Diagnosis not present

## 2018-08-03 DIAGNOSIS — I429 Cardiomyopathy, unspecified: Secondary | ICD-10-CM | POA: Diagnosis present

## 2018-08-03 DIAGNOSIS — R809 Proteinuria, unspecified: Secondary | ICD-10-CM | POA: Diagnosis not present

## 2018-08-03 DIAGNOSIS — N183 Chronic kidney disease, stage 3 (moderate): Secondary | ICD-10-CM | POA: Diagnosis not present

## 2018-08-03 DIAGNOSIS — J3089 Other allergic rhinitis: Secondary | ICD-10-CM | POA: Diagnosis present

## 2018-08-03 DIAGNOSIS — J9600 Acute respiratory failure, unspecified whether with hypoxia or hypercapnia: Secondary | ICD-10-CM | POA: Diagnosis not present

## 2018-08-03 DIAGNOSIS — Z992 Dependence on renal dialysis: Secondary | ICD-10-CM | POA: Diagnosis not present

## 2018-08-03 DIAGNOSIS — R0602 Shortness of breath: Secondary | ICD-10-CM

## 2018-08-03 DIAGNOSIS — J80 Acute respiratory distress syndrome: Secondary | ICD-10-CM | POA: Diagnosis not present

## 2018-08-03 DIAGNOSIS — R944 Abnormal results of kidney function studies: Secondary | ICD-10-CM | POA: Diagnosis not present

## 2018-08-03 DIAGNOSIS — D649 Anemia, unspecified: Secondary | ICD-10-CM | POA: Diagnosis not present

## 2018-08-03 DIAGNOSIS — I5021 Acute systolic (congestive) heart failure: Secondary | ICD-10-CM | POA: Diagnosis not present

## 2018-08-03 DIAGNOSIS — M7122 Synovial cyst of popliteal space [Baker], left knee: Secondary | ICD-10-CM | POA: Diagnosis present

## 2018-08-03 DIAGNOSIS — E039 Hypothyroidism, unspecified: Secondary | ICD-10-CM | POA: Diagnosis present

## 2018-08-03 DIAGNOSIS — E785 Hyperlipidemia, unspecified: Secondary | ICD-10-CM | POA: Diagnosis present

## 2018-08-03 DIAGNOSIS — I420 Dilated cardiomyopathy: Secondary | ICD-10-CM | POA: Diagnosis not present

## 2018-08-03 DIAGNOSIS — F411 Generalized anxiety disorder: Secondary | ICD-10-CM | POA: Diagnosis not present

## 2018-08-03 DIAGNOSIS — I129 Hypertensive chronic kidney disease with stage 1 through stage 4 chronic kidney disease, or unspecified chronic kidney disease: Secondary | ICD-10-CM | POA: Diagnosis not present

## 2018-08-03 DIAGNOSIS — F5101 Primary insomnia: Secondary | ICD-10-CM | POA: Diagnosis present

## 2018-08-03 DIAGNOSIS — N186 End stage renal disease: Secondary | ICD-10-CM | POA: Diagnosis present

## 2018-08-03 DIAGNOSIS — J811 Chronic pulmonary edema: Secondary | ICD-10-CM | POA: Diagnosis not present

## 2018-08-03 DIAGNOSIS — I132 Hypertensive heart and chronic kidney disease with heart failure and with stage 5 chronic kidney disease, or end stage renal disease: Secondary | ICD-10-CM | POA: Diagnosis present

## 2018-08-03 DIAGNOSIS — J90 Pleural effusion, not elsewhere classified: Secondary | ICD-10-CM | POA: Diagnosis not present

## 2018-08-03 DIAGNOSIS — J9601 Acute respiratory failure with hypoxia: Secondary | ICD-10-CM | POA: Diagnosis present

## 2018-08-03 DIAGNOSIS — D539 Nutritional anemia, unspecified: Secondary | ICD-10-CM | POA: Diagnosis present

## 2018-08-03 DIAGNOSIS — R7989 Other specified abnormal findings of blood chemistry: Secondary | ICD-10-CM | POA: Diagnosis not present

## 2018-08-03 DIAGNOSIS — F329 Major depressive disorder, single episode, unspecified: Secondary | ICD-10-CM | POA: Diagnosis not present

## 2018-08-03 DIAGNOSIS — N171 Acute kidney failure with acute cortical necrosis: Secondary | ICD-10-CM | POA: Diagnosis not present

## 2018-08-03 DIAGNOSIS — R064 Hyperventilation: Secondary | ICD-10-CM | POA: Diagnosis not present

## 2018-08-03 DIAGNOSIS — M1990 Unspecified osteoarthritis, unspecified site: Secondary | ICD-10-CM | POA: Diagnosis not present

## 2018-08-03 DIAGNOSIS — Z20828 Contact with and (suspected) exposure to other viral communicable diseases: Secondary | ICD-10-CM | POA: Diagnosis present

## 2018-08-03 DIAGNOSIS — Z90710 Acquired absence of both cervix and uterus: Secondary | ICD-10-CM | POA: Diagnosis not present

## 2018-08-03 DIAGNOSIS — Z803 Family history of malignant neoplasm of breast: Secondary | ICD-10-CM | POA: Diagnosis not present

## 2018-08-03 DIAGNOSIS — R0689 Other abnormalities of breathing: Secondary | ICD-10-CM | POA: Diagnosis not present

## 2018-08-03 LAB — IRON AND TIBC
Iron: 138 ug/dL (ref 28–170)
Saturation Ratios: 45 % — ABNORMAL HIGH (ref 10.4–31.8)
TIBC: 304 ug/dL (ref 250–450)
UIBC: 166 ug/dL

## 2018-08-03 LAB — PREPARE RBC (CROSSMATCH)

## 2018-08-03 LAB — COMPREHENSIVE METABOLIC PANEL
ALT: 26 U/L (ref 0–44)
AST: 38 U/L (ref 15–41)
Albumin: 3.1 g/dL — ABNORMAL LOW (ref 3.5–5.0)
Alkaline Phosphatase: 58 U/L (ref 38–126)
Anion gap: 9 (ref 5–15)
BUN: 58 mg/dL — ABNORMAL HIGH (ref 8–23)
CO2: 24 mmol/L (ref 22–32)
Calcium: 7.6 mg/dL — ABNORMAL LOW (ref 8.9–10.3)
Chloride: 96 mmol/L — ABNORMAL LOW (ref 98–111)
Creatinine, Ser: 3.54 mg/dL — ABNORMAL HIGH (ref 0.44–1.00)
GFR calc Af Amer: 15 mL/min — ABNORMAL LOW (ref >60)
GFR calc non Af Amer: 13 mL/min — ABNORMAL LOW (ref >60)
Glucose, Bld: 104 mg/dL — ABNORMAL HIGH (ref 70–99)
Potassium: 5.2 mmol/L — ABNORMAL HIGH (ref 3.5–5.1)
Sodium: 129 mmol/L — ABNORMAL LOW (ref 135–145)
Total Bilirubin: 0.8 mg/dL (ref 0.3–1.2)
Total Protein: 5.7 g/dL — ABNORMAL LOW (ref 6.5–8.1)

## 2018-08-03 LAB — CBC WITH DIFFERENTIAL/PLATELET
Abs Immature Granulocytes: 0.28 10*3/uL — ABNORMAL HIGH (ref 0.00–0.07)
Basophils Absolute: 0 10*3/uL (ref 0.0–0.1)
Basophils Relative: 0 %
Eosinophils Absolute: 0 10*3/uL (ref 0.0–0.5)
Eosinophils Relative: 1 %
HCT: 17.5 % — ABNORMAL LOW (ref 36.0–46.0)
Hemoglobin: 5.9 g/dL — ABNORMAL LOW (ref 12.0–15.0)
Immature Granulocytes: 5 %
Lymphocytes Relative: 28 %
Lymphs Abs: 1.6 10*3/uL (ref 0.7–4.0)
MCH: 36.9 pg — ABNORMAL HIGH (ref 26.0–34.0)
MCHC: 33.7 g/dL (ref 30.0–36.0)
MCV: 109.4 fL — ABNORMAL HIGH (ref 80.0–100.0)
Monocytes Absolute: 0.8 10*3/uL (ref 0.1–1.0)
Monocytes Relative: 14 %
Neutro Abs: 2.8 10*3/uL (ref 1.7–7.7)
Neutrophils Relative %: 52 %
Platelets: 39 10*3/uL — ABNORMAL LOW (ref 150–400)
RBC: 1.6 MIL/uL — ABNORMAL LOW (ref 3.87–5.11)
RDW: 22 % — ABNORMAL HIGH (ref 11.5–15.5)
WBC: 5.5 10*3/uL (ref 4.0–10.5)
nRBC: 0.7 % — ABNORMAL HIGH (ref 0.0–0.2)

## 2018-08-03 LAB — FOLATE: Folate: 47 ng/mL (ref >5.9)

## 2018-08-03 LAB — RETICULOCYTES
Immature Retic Fract: 43.7 % — ABNORMAL HIGH (ref 2.3–15.9)
RBC.: 1.6 MIL/uL — ABNORMAL LOW (ref 3.87–5.11)
Retic Count, Absolute: 94.1 10*3/uL (ref 19.0–186.0)
Retic Ct Pct: 5.9 % — ABNORMAL HIGH (ref 0.4–3.1)

## 2018-08-03 LAB — FERRITIN: Ferritin: 1442 ng/mL — ABNORMAL HIGH (ref 11–307)

## 2018-08-03 LAB — VITAMIN B12: Vitamin B-12: 1425 pg/mL — ABNORMAL HIGH (ref 180–914)

## 2018-08-03 MED ORDER — ACETAMINOPHEN 325 MG PO TABS
650.0000 mg | ORAL_TABLET | Freq: Once | ORAL | Status: AC
  Administered 2018-08-03: 650 mg via ORAL
  Filled 2018-08-03: qty 2

## 2018-08-03 MED ORDER — CYCLOBENZAPRINE HCL 10 MG PO TABS: ORAL_TABLET | ORAL | 3 refills | Status: DC

## 2018-08-03 MED ORDER — SODIUM CHLORIDE 0.9% IV SOLUTION
250.0000 mL | Freq: Once | INTRAVENOUS | Status: AC
  Administered 2018-08-03: 10:00:00 250 mL via INTRAVENOUS
  Filled 2018-08-03: qty 250

## 2018-08-03 MED ORDER — DIPHENHYDRAMINE HCL 50 MG/ML IJ SOLN
25.0000 mg | Freq: Once | INTRAMUSCULAR | Status: AC
  Administered 2018-08-03: 25 mg via INTRAVENOUS
  Filled 2018-08-03: qty 1

## 2018-08-03 MED ORDER — HEPARIN SOD (PORK) LOCK FLUSH 100 UNIT/ML IV SOLN
500.0000 [IU] | Freq: Once | INTRAVENOUS | Status: AC
  Administered 2018-08-03: 500 [IU] via INTRAVENOUS
  Filled 2018-08-03: qty 5

## 2018-08-03 MED ORDER — SODIUM CHLORIDE 0.9% FLUSH
10.0000 mL | Freq: Once | INTRAVENOUS | Status: AC
  Administered 2018-08-03: 08:00:00 10 mL via INTRAVENOUS
  Filled 2018-08-03: qty 10

## 2018-08-04 ENCOUNTER — Other Ambulatory Visit: Payer: Self-pay | Admitting: *Deleted

## 2018-08-04 DIAGNOSIS — C569 Malignant neoplasm of unspecified ovary: Secondary | ICD-10-CM

## 2018-08-04 LAB — BPAM RBC
Blood Product Expiration Date: 202005282359
Blood Product Expiration Date: 202006032359
ISSUE DATE / TIME: 202005181223
ISSUE DATE / TIME: 202005181402
Unit Type and Rh: 7300
Unit Type and Rh: 7300

## 2018-08-04 LAB — TYPE AND SCREEN
ABO/RH(D): B POS
Antibody Screen: NEGATIVE
Unit division: 0
Unit division: 0

## 2018-08-04 LAB — HAPTOGLOBIN: Haptoglobin: <10 mg/dL — ABNORMAL LOW (ref 37–355)

## 2018-08-05 ENCOUNTER — Inpatient Hospital Stay: Payer: PPO

## 2018-08-05 ENCOUNTER — Other Ambulatory Visit: Payer: Self-pay

## 2018-08-05 VITALS — BP 152/85 | HR 81 | Temp 96.1°F

## 2018-08-05 DIAGNOSIS — D6481 Anemia due to antineoplastic chemotherapy: Secondary | ICD-10-CM | POA: Diagnosis not present

## 2018-08-05 DIAGNOSIS — R5383 Other fatigue: Secondary | ICD-10-CM | POA: Diagnosis not present

## 2018-08-05 DIAGNOSIS — Z5111 Encounter for antineoplastic chemotherapy: Secondary | ICD-10-CM | POA: Diagnosis not present

## 2018-08-05 DIAGNOSIS — Z85828 Personal history of other malignant neoplasm of skin: Secondary | ICD-10-CM | POA: Diagnosis not present

## 2018-08-05 DIAGNOSIS — R531 Weakness: Secondary | ICD-10-CM | POA: Diagnosis not present

## 2018-08-05 DIAGNOSIS — G4700 Insomnia, unspecified: Secondary | ICD-10-CM | POA: Diagnosis not present

## 2018-08-05 DIAGNOSIS — R5381 Other malaise: Secondary | ICD-10-CM | POA: Diagnosis not present

## 2018-08-05 DIAGNOSIS — F419 Anxiety disorder, unspecified: Secondary | ICD-10-CM | POA: Diagnosis not present

## 2018-08-05 DIAGNOSIS — N179 Acute kidney failure, unspecified: Secondary | ICD-10-CM | POA: Diagnosis not present

## 2018-08-05 DIAGNOSIS — R630 Anorexia: Secondary | ICD-10-CM | POA: Diagnosis not present

## 2018-08-05 DIAGNOSIS — C569 Malignant neoplasm of unspecified ovary: Secondary | ICD-10-CM

## 2018-08-05 DIAGNOSIS — Z7951 Long term (current) use of inhaled steroids: Secondary | ICD-10-CM | POA: Diagnosis not present

## 2018-08-05 DIAGNOSIS — M25519 Pain in unspecified shoulder: Secondary | ICD-10-CM | POA: Diagnosis not present

## 2018-08-05 DIAGNOSIS — T451X5A Adverse effect of antineoplastic and immunosuppressive drugs, initial encounter: Secondary | ICD-10-CM | POA: Diagnosis not present

## 2018-08-05 DIAGNOSIS — Z87891 Personal history of nicotine dependence: Secondary | ICD-10-CM | POA: Diagnosis not present

## 2018-08-05 DIAGNOSIS — K0889 Other specified disorders of teeth and supporting structures: Secondary | ICD-10-CM | POA: Diagnosis not present

## 2018-08-05 DIAGNOSIS — G620 Drug-induced polyneuropathy: Secondary | ICD-10-CM | POA: Diagnosis not present

## 2018-08-05 DIAGNOSIS — R0602 Shortness of breath: Secondary | ICD-10-CM | POA: Diagnosis not present

## 2018-08-05 DIAGNOSIS — D6959 Other secondary thrombocytopenia: Secondary | ICD-10-CM | POA: Diagnosis not present

## 2018-08-05 DIAGNOSIS — Z79899 Other long term (current) drug therapy: Secondary | ICD-10-CM | POA: Diagnosis not present

## 2018-08-05 DIAGNOSIS — Z95828 Presence of other vascular implants and grafts: Secondary | ICD-10-CM

## 2018-08-05 DIAGNOSIS — D701 Agranulocytosis secondary to cancer chemotherapy: Secondary | ICD-10-CM | POA: Diagnosis not present

## 2018-08-05 DIAGNOSIS — M7989 Other specified soft tissue disorders: Secondary | ICD-10-CM | POA: Diagnosis not present

## 2018-08-05 DIAGNOSIS — M1990 Unspecified osteoarthritis, unspecified site: Secondary | ICD-10-CM | POA: Diagnosis not present

## 2018-08-05 LAB — CBC WITH DIFFERENTIAL/PLATELET
Abs Immature Granulocytes: 0.13 10*3/uL — ABNORMAL HIGH (ref 0.00–0.07)
Basophils Absolute: 0 10*3/uL (ref 0.0–0.1)
Basophils Relative: 0 %
Eosinophils Absolute: 0.1 10*3/uL (ref 0.0–0.5)
Eosinophils Relative: 2 %
HCT: 24.1 % — ABNORMAL LOW (ref 36.0–46.0)
Hemoglobin: 8.1 g/dL — ABNORMAL LOW (ref 12.0–15.0)
Immature Granulocytes: 3 %
Lymphocytes Relative: 19 %
Lymphs Abs: 0.9 10*3/uL (ref 0.7–4.0)
MCH: 34.6 pg — ABNORMAL HIGH (ref 26.0–34.0)
MCHC: 33.6 g/dL (ref 30.0–36.0)
MCV: 103 fL — ABNORMAL HIGH (ref 80.0–100.0)
Monocytes Absolute: 0.8 10*3/uL (ref 0.1–1.0)
Monocytes Relative: 17 %
Neutro Abs: 2.7 10*3/uL (ref 1.7–7.7)
Neutrophils Relative %: 59 %
Platelets: 45 10*3/uL — ABNORMAL LOW (ref 150–400)
RBC: 2.34 MIL/uL — ABNORMAL LOW (ref 3.87–5.11)
RDW: 23.5 % — ABNORMAL HIGH (ref 11.5–15.5)
WBC: 4.6 10*3/uL (ref 4.0–10.5)
nRBC: 0 % (ref 0.0–0.2)

## 2018-08-05 LAB — COMPREHENSIVE METABOLIC PANEL
ALT: 21 U/L (ref 0–44)
AST: 32 U/L (ref 15–41)
Albumin: 2.9 g/dL — ABNORMAL LOW (ref 3.5–5.0)
Alkaline Phosphatase: 61 U/L (ref 38–126)
Anion gap: 8 (ref 5–15)
BUN: 59 mg/dL — ABNORMAL HIGH (ref 8–23)
CO2: 24 mmol/L (ref 22–32)
Calcium: 8 mg/dL — ABNORMAL LOW (ref 8.9–10.3)
Chloride: 105 mmol/L (ref 98–111)
Creatinine, Ser: 3.2 mg/dL — ABNORMAL HIGH (ref 0.44–1.00)
GFR calc Af Amer: 17 mL/min — ABNORMAL LOW (ref >60)
GFR calc non Af Amer: 15 mL/min — ABNORMAL LOW (ref >60)
Glucose, Bld: 119 mg/dL — ABNORMAL HIGH (ref 70–99)
Potassium: 5.2 mmol/L — ABNORMAL HIGH (ref 3.5–5.1)
Sodium: 137 mmol/L (ref 135–145)
Total Bilirubin: 0.9 mg/dL (ref 0.3–1.2)
Total Protein: 5.7 g/dL — ABNORMAL LOW (ref 6.5–8.1)

## 2018-08-05 MED ORDER — DEXAMETHASONE SODIUM PHOSPHATE 10 MG/ML IJ SOLN
10.0000 mg | Freq: Once | INTRAMUSCULAR | Status: AC
  Administered 2018-08-05: 10:00:00 10 mg via INTRAVENOUS
  Filled 2018-08-05: qty 1

## 2018-08-05 MED ORDER — HEPARIN SOD (PORK) LOCK FLUSH 100 UNIT/ML IV SOLN
500.0000 [IU] | Freq: Once | INTRAVENOUS | Status: AC
  Administered 2018-08-05: 11:00:00 500 [IU] via INTRAVENOUS

## 2018-08-05 MED ORDER — SODIUM CHLORIDE 0.9 % IV SOLN
Freq: Once | INTRAVENOUS | Status: AC
  Administered 2018-08-05: 10:00:00 via INTRAVENOUS
  Filled 2018-08-05: qty 250

## 2018-08-05 MED ORDER — SODIUM CHLORIDE 0.9% FLUSH
10.0000 mL | Freq: Once | INTRAVENOUS | Status: AC
  Administered 2018-08-05: 09:00:00 10 mL via INTRAVENOUS
  Filled 2018-08-05: qty 10

## 2018-08-05 MED ORDER — SODIUM CHLORIDE 0.9 % IV SOLN: 10.0000 mg | Freq: Once | INTRAVENOUS | Status: DC

## 2018-08-06 ENCOUNTER — Emergency Department: Payer: PPO

## 2018-08-06 ENCOUNTER — Encounter: Payer: Self-pay | Admitting: *Deleted

## 2018-08-06 ENCOUNTER — Other Ambulatory Visit: Payer: Self-pay

## 2018-08-06 ENCOUNTER — Inpatient Hospital Stay
Admission: EM | Admit: 2018-08-06 | Discharge: 2018-08-18 | DRG: 291 | Disposition: A | Discharge status: Home / Self Care | Payer: PPO | Attending: Internal Medicine | Admitting: Internal Medicine

## 2018-08-06 DIAGNOSIS — D6481 Anemia due to antineoplastic chemotherapy: Secondary | ICD-10-CM | POA: Diagnosis present

## 2018-08-06 DIAGNOSIS — E785 Hyperlipidemia, unspecified: Secondary | ICD-10-CM | POA: Diagnosis present

## 2018-08-06 DIAGNOSIS — Z20828 Contact with and (suspected) exposure to other viral communicable diseases: Secondary | ICD-10-CM | POA: Diagnosis present

## 2018-08-06 DIAGNOSIS — I509 Heart failure, unspecified: Secondary | ICD-10-CM

## 2018-08-06 DIAGNOSIS — Z87891 Personal history of nicotine dependence: Secondary | ICD-10-CM

## 2018-08-06 DIAGNOSIS — T451X5A Adverse effect of antineoplastic and immunosuppressive drugs, initial encounter: Secondary | ICD-10-CM | POA: Diagnosis present

## 2018-08-06 DIAGNOSIS — D539 Nutritional anemia, unspecified: Secondary | ICD-10-CM | POA: Diagnosis present

## 2018-08-06 DIAGNOSIS — Z803 Family history of malignant neoplasm of breast: Secondary | ICD-10-CM | POA: Diagnosis not present

## 2018-08-06 DIAGNOSIS — E875 Hyperkalemia: Secondary | ICD-10-CM | POA: Diagnosis present

## 2018-08-06 DIAGNOSIS — C569 Malignant neoplasm of unspecified ovary: Secondary | ICD-10-CM

## 2018-08-06 DIAGNOSIS — I5041 Acute combined systolic (congestive) and diastolic (congestive) heart failure: Secondary | ICD-10-CM | POA: Diagnosis present

## 2018-08-06 DIAGNOSIS — F419 Anxiety disorder, unspecified: Secondary | ICD-10-CM | POA: Diagnosis not present

## 2018-08-06 DIAGNOSIS — I429 Cardiomyopathy, unspecified: Secondary | ICD-10-CM | POA: Diagnosis present

## 2018-08-06 DIAGNOSIS — G4700 Insomnia, unspecified: Secondary | ICD-10-CM | POA: Diagnosis not present

## 2018-08-06 DIAGNOSIS — N171 Acute kidney failure with acute cortical necrosis: Secondary | ICD-10-CM | POA: Diagnosis not present

## 2018-08-06 DIAGNOSIS — R042 Hemoptysis: Secondary | ICD-10-CM | POA: Diagnosis not present

## 2018-08-06 DIAGNOSIS — I248 Other forms of acute ischemic heart disease: Secondary | ICD-10-CM | POA: Diagnosis present

## 2018-08-06 DIAGNOSIS — Z992 Dependence on renal dialysis: Secondary | ICD-10-CM | POA: Diagnosis not present

## 2018-08-06 DIAGNOSIS — I5021 Acute systolic (congestive) heart failure: Secondary | ICD-10-CM | POA: Diagnosis not present

## 2018-08-06 DIAGNOSIS — E872 Acidosis: Secondary | ICD-10-CM | POA: Diagnosis present

## 2018-08-06 DIAGNOSIS — Z7951 Long term (current) use of inhaled steroids: Secondary | ICD-10-CM

## 2018-08-06 DIAGNOSIS — M7122 Synovial cyst of popliteal space [Baker], left knee: Secondary | ICD-10-CM | POA: Diagnosis present

## 2018-08-06 DIAGNOSIS — I132 Hypertensive heart and chronic kidney disease with heart failure and with stage 5 chronic kidney disease, or end stage renal disease: Principal | ICD-10-CM | POA: Diagnosis present

## 2018-08-06 DIAGNOSIS — Z79890 Hormone replacement therapy (postmenopausal): Secondary | ICD-10-CM

## 2018-08-06 DIAGNOSIS — G43909 Migraine, unspecified, not intractable, without status migrainosus: Secondary | ICD-10-CM | POA: Diagnosis present

## 2018-08-06 DIAGNOSIS — I420 Dilated cardiomyopathy: Secondary | ICD-10-CM | POA: Diagnosis not present

## 2018-08-06 DIAGNOSIS — D6959 Other secondary thrombocytopenia: Secondary | ICD-10-CM | POA: Diagnosis present

## 2018-08-06 DIAGNOSIS — N186 End stage renal disease: Secondary | ICD-10-CM | POA: Diagnosis present

## 2018-08-06 DIAGNOSIS — J441 Chronic obstructive pulmonary disease with (acute) exacerbation: Secondary | ICD-10-CM | POA: Diagnosis not present

## 2018-08-06 DIAGNOSIS — I5043 Acute on chronic combined systolic (congestive) and diastolic (congestive) heart failure: Secondary | ICD-10-CM | POA: Diagnosis not present

## 2018-08-06 DIAGNOSIS — N179 Acute kidney failure, unspecified: Secondary | ICD-10-CM | POA: Diagnosis present

## 2018-08-06 DIAGNOSIS — I340 Nonrheumatic mitral (valve) insufficiency: Secondary | ICD-10-CM | POA: Diagnosis not present

## 2018-08-06 DIAGNOSIS — F411 Generalized anxiety disorder: Secondary | ICD-10-CM | POA: Diagnosis not present

## 2018-08-06 DIAGNOSIS — N184 Chronic kidney disease, stage 4 (severe): Secondary | ICD-10-CM | POA: Diagnosis not present

## 2018-08-06 DIAGNOSIS — J9600 Acute respiratory failure, unspecified whether with hypoxia or hypercapnia: Secondary | ICD-10-CM

## 2018-08-06 DIAGNOSIS — F329 Major depressive disorder, single episode, unspecified: Secondary | ICD-10-CM | POA: Diagnosis not present

## 2018-08-06 DIAGNOSIS — F5101 Primary insomnia: Secondary | ICD-10-CM | POA: Diagnosis present

## 2018-08-06 DIAGNOSIS — Z79899 Other long term (current) drug therapy: Secondary | ICD-10-CM

## 2018-08-06 DIAGNOSIS — R7989 Other specified abnormal findings of blood chemistry: Secondary | ICD-10-CM

## 2018-08-06 DIAGNOSIS — N189 Chronic kidney disease, unspecified: Secondary | ICD-10-CM

## 2018-08-06 DIAGNOSIS — Z888 Allergy status to other drugs, medicaments and biological substances status: Secondary | ICD-10-CM

## 2018-08-06 DIAGNOSIS — R0600 Dyspnea, unspecified: Secondary | ICD-10-CM

## 2018-08-06 DIAGNOSIS — Z95828 Presence of other vascular implants and grafts: Secondary | ICD-10-CM | POA: Diagnosis not present

## 2018-08-06 DIAGNOSIS — E039 Hypothyroidism, unspecified: Secondary | ICD-10-CM | POA: Diagnosis present

## 2018-08-06 DIAGNOSIS — D631 Anemia in chronic kidney disease: Secondary | ICD-10-CM | POA: Diagnosis present

## 2018-08-06 DIAGNOSIS — D696 Thrombocytopenia, unspecified: Secondary | ICD-10-CM | POA: Diagnosis not present

## 2018-08-06 DIAGNOSIS — C482 Malignant neoplasm of peritoneum, unspecified: Secondary | ICD-10-CM | POA: Diagnosis present

## 2018-08-06 DIAGNOSIS — J3089 Other allergic rhinitis: Secondary | ICD-10-CM | POA: Diagnosis present

## 2018-08-06 DIAGNOSIS — J9601 Acute respiratory failure with hypoxia: Secondary | ICD-10-CM | POA: Diagnosis present

## 2018-08-06 DIAGNOSIS — G8929 Other chronic pain: Secondary | ICD-10-CM

## 2018-08-06 DIAGNOSIS — Z90710 Acquired absence of both cervix and uterus: Secondary | ICD-10-CM | POA: Diagnosis not present

## 2018-08-06 DIAGNOSIS — Z85828 Personal history of other malignant neoplasm of skin: Secondary | ICD-10-CM

## 2018-08-06 DIAGNOSIS — Z9221 Personal history of antineoplastic chemotherapy: Secondary | ICD-10-CM

## 2018-08-06 DIAGNOSIS — R0602 Shortness of breath: Secondary | ICD-10-CM | POA: Diagnosis present

## 2018-08-06 DIAGNOSIS — Z818 Family history of other mental and behavioral disorders: Secondary | ICD-10-CM

## 2018-08-06 DIAGNOSIS — D649 Anemia, unspecified: Secondary | ICD-10-CM | POA: Diagnosis not present

## 2018-08-06 LAB — COMPREHENSIVE METABOLIC PANEL
ALT: 22 U/L (ref 0–44)
AST: 49 U/L — ABNORMAL HIGH (ref 15–41)
Albumin: 3.3 g/dL — ABNORMAL LOW (ref 3.5–5.0)
Alkaline Phosphatase: 66 U/L (ref 38–126)
Anion gap: 11 (ref 5–15)
BUN: 69 mg/dL — ABNORMAL HIGH (ref 8–23)
CO2: 20 mmol/L — ABNORMAL LOW (ref 22–32)
Calcium: 8.3 mg/dL — ABNORMAL LOW (ref 8.9–10.3)
Chloride: 107 mmol/L (ref 98–111)
Creatinine, Ser: 3.19 mg/dL — ABNORMAL HIGH (ref 0.44–1.00)
GFR calc Af Amer: 17 mL/min — ABNORMAL LOW (ref >60)
GFR calc non Af Amer: 15 mL/min — ABNORMAL LOW (ref >60)
Glucose, Bld: 148 mg/dL — ABNORMAL HIGH (ref 70–99)
Potassium: 5.5 mmol/L — ABNORMAL HIGH (ref 3.5–5.1)
Sodium: 138 mmol/L (ref 135–145)
Total Bilirubin: 1.2 mg/dL (ref 0.3–1.2)
Total Protein: 6.5 g/dL (ref 6.5–8.1)

## 2018-08-06 LAB — BLOOD GAS, ARTERIAL
Acid-base deficit: 5.1 mmol/L — ABNORMAL HIGH (ref 0.0–2.0)
Bicarbonate: 18.8 mmol/L — ABNORMAL LOW (ref 20.0–28.0)
Delivery systems: POSITIVE
Expiratory PAP: 5
FIO2: 0.28
Inspiratory PAP: 10
O2 Saturation: 98.9 %
Patient temperature: 37
RATE: 8 resp/min
pCO2 arterial: 31 mmHg — ABNORMAL LOW (ref 32.0–48.0)
pH, Arterial: 7.39 (ref 7.350–7.450)
pO2, Arterial: 127 mmHg — ABNORMAL HIGH (ref 83.0–108.0)

## 2018-08-06 LAB — CBC WITH DIFFERENTIAL/PLATELET
Abs Immature Granulocytes: 0.13 10*3/uL — ABNORMAL HIGH (ref 0.00–0.07)
Basophils Absolute: 0 10*3/uL (ref 0.0–0.1)
Basophils Relative: 0 %
Eosinophils Absolute: 0 10*3/uL (ref 0.0–0.5)
Eosinophils Relative: 0 %
HCT: 26.5 % — ABNORMAL LOW (ref 36.0–46.0)
Hemoglobin: 8.6 g/dL — ABNORMAL LOW (ref 12.0–15.0)
Immature Granulocytes: 1 %
Lymphocytes Relative: 5 %
Lymphs Abs: 0.7 10*3/uL (ref 0.7–4.0)
MCH: 34.3 pg — ABNORMAL HIGH (ref 26.0–34.0)
MCHC: 32.5 g/dL (ref 30.0–36.0)
MCV: 105.6 fL — ABNORMAL HIGH (ref 80.0–100.0)
Monocytes Absolute: 1.3 10*3/uL — ABNORMAL HIGH (ref 0.1–1.0)
Monocytes Relative: 11 %
Neutro Abs: 10.6 10*3/uL — ABNORMAL HIGH (ref 1.7–7.7)
Neutrophils Relative %: 83 %
Platelets: 58 10*3/uL — ABNORMAL LOW (ref 150–400)
RBC: 2.51 MIL/uL — ABNORMAL LOW (ref 3.87–5.11)
RDW: 23.8 % — ABNORMAL HIGH (ref 11.5–15.5)
WBC: 12.8 10*3/uL — ABNORMAL HIGH (ref 4.0–10.5)
nRBC: 0 % (ref 0.0–0.2)

## 2018-08-06 LAB — TROPONIN I: Troponin I: 0.06 ng/mL (ref <0.03)

## 2018-08-06 LAB — BRAIN NATRIURETIC PEPTIDE: B Natriuretic Peptide: 1407 pg/mL — ABNORMAL HIGH (ref 0.0–100.0)

## 2018-08-06 LAB — FIBRIN DERIVATIVES D-DIMER (ARMC ONLY): Fibrin derivatives D-dimer (ARMC): 3693.72 ng/mL (FEU) — ABNORMAL HIGH (ref 0.00–499.00)

## 2018-08-06 LAB — LACTIC ACID, PLASMA: Lactic Acid, Venous: 3.4 mmol/L (ref 0.5–1.9)

## 2018-08-06 LAB — SARS CORONAVIRUS 2 BY RT PCR (HOSPITAL ORDER, PERFORMED IN ~~LOC~~ HOSPITAL LAB): SARS Coronavirus 2: NEGATIVE

## 2018-08-06 MED ORDER — FUROSEMIDE 10 MG/ML IJ SOLN
40.0000 mg | Freq: Once | INTRAMUSCULAR | Status: AC
  Administered 2018-08-06: 40 mg via INTRAVENOUS
  Filled 2018-08-06: qty 4

## 2018-08-06 MED ORDER — LORAZEPAM 2 MG/ML IJ SOLN
INTRAMUSCULAR | Status: AC
  Administered 2018-08-06: 1 mg via INTRAVENOUS
  Filled 2018-08-06: qty 1

## 2018-08-06 MED ORDER — ONDANSETRON HCL 4 MG/2ML IJ SOLN: 4.0000 mg | Freq: Four times a day (QID) | INTRAMUSCULAR | Status: DC | PRN

## 2018-08-06 MED ORDER — FUROSEMIDE 10 MG/ML IJ SOLN
20.0000 mg | Freq: Once | INTRAMUSCULAR | Status: AC
  Administered 2018-08-06: 20 mg via INTRAVENOUS

## 2018-08-06 MED ORDER — NITROGLYCERIN 2 % TD OINT
1.0000 [in_us] | TOPICAL_OINTMENT | Freq: Once | TRANSDERMAL | Status: AC
  Administered 2018-08-06: 1 [in_us] via TOPICAL
  Filled 2018-08-06: qty 1

## 2018-08-06 MED ORDER — METOPROLOL SUCCINATE ER 25 MG PO TB24
25.0000 mg | ORAL_TABLET | Freq: Every day | ORAL | Status: DC
  Administered 2018-08-07 – 2018-08-10 (×4): 25 mg via ORAL
  Filled 2018-08-06 (×4): qty 1

## 2018-08-06 MED ORDER — FUROSEMIDE 10 MG/ML IJ SOLN: 40.0000 mg | Freq: Two times a day (BID) | INTRAMUSCULAR | Status: DC

## 2018-08-06 MED ORDER — SODIUM CHLORIDE 0.9% FLUSH: 3.0000 mL | INTRAVENOUS | Status: DC | PRN

## 2018-08-06 MED ORDER — IPRATROPIUM-ALBUTEROL 0.5-2.5 (3) MG/3ML IN SOLN
3.0000 mL | Freq: Once | RESPIRATORY_TRACT | Status: AC
  Administered 2018-08-06: 3 mL via RESPIRATORY_TRACT
  Filled 2018-08-06: qty 3

## 2018-08-06 MED ORDER — NITROGLYCERIN 2 % TD OINT
0.5000 [in_us] | TOPICAL_OINTMENT | Freq: Once | TRANSDERMAL | Status: AC
  Administered 2018-08-06: 0.5 [in_us] via TOPICAL
  Filled 2018-08-06: qty 1

## 2018-08-06 MED ORDER — LORAZEPAM 2 MG/ML IJ SOLN
1.0000 mg | Freq: Once | INTRAMUSCULAR | Status: AC
  Administered 2018-08-06: 1 mg via INTRAVENOUS

## 2018-08-06 MED ORDER — ACETAMINOPHEN 325 MG PO TABS
650.0000 mg | ORAL_TABLET | ORAL | Status: DC | PRN
  Administered 2018-08-15: 650 mg via ORAL

## 2018-08-06 MED ORDER — SODIUM CHLORIDE 0.9 % IV SOLN: 250.0000 mL | INTRAVENOUS | Status: DC | PRN

## 2018-08-06 MED ORDER — ENALAPRILAT 1.25 MG/ML IV SOLN
1.2500 mg | Freq: Once | INTRAVENOUS | Status: AC
  Administered 2018-08-06: 1.25 mg via INTRAVENOUS
  Filled 2018-08-06: qty 2

## 2018-08-06 MED ORDER — SODIUM CHLORIDE 0.9% FLUSH
3.0000 mL | Freq: Two times a day (BID) | INTRAVENOUS | Status: DC
  Administered 2018-08-07 – 2018-08-18 (×13): 3 mL via INTRAVENOUS

## 2018-08-06 MED ORDER — LORAZEPAM 2 MG/ML IJ SOLN
0.5000 mg | Freq: Once | INTRAMUSCULAR | Status: AC
  Administered 2018-08-06: 0.5 mg via INTRAVENOUS
  Filled 2018-08-06: qty 1

## 2018-08-06 NOTE — ED Triage Notes (Signed)
Pt presents via EMS in respiratory distress starting this morning @ home. Pt states worsening SHOB x 2 weeks. Pt recently transfused and had weight gain of 5 lbs over the past 3 days. Pt is tachypneic, tachycardiac, increased work of breathing, and hypertensive upon arrival to ED.

## 2018-08-06 NOTE — ED Notes (Signed)
Pt presents via EMS in respiratory distress starting this morning @ home. Pt states worsening SHOB x 2 weeks. Pt recently transfused and had weight gain of 5 lbs over the past 3 days. Pt is tachypneic, tachycardiac, increased work of breathing, and hypertensive upon arrival to ED.

## 2018-08-06 NOTE — Progress Notes (Signed)
Pt taken to CT on the Bipap. Pt experienced increased WOB and SOB while returning to ER stretcher. Upon returning to ER 1 pt stated she felt better and breathing has normalized. Pt remains on Bipap and is tol well at this time.

## 2018-08-06 NOTE — ED Provider Notes (Signed)
[facility] Emergency Department Provider Note   ____________________________________________   First MD Initiated Contact with Patient 08/06/18 2001     (approximate)  I have reviewed the triage vital signs and the nursing notes.   HISTORY  Chief Complaint Shortness of Breath    HPI Jessica Burgess is a 63 y.o. female who reports 5 pounds of weight gain in the last week.  She got short of breath for the last several days much worse today.  She denies any pleuritic type of chest pain.  She does have chest tightness.         Past Medical History:  Diagnosis Date  . Allergic rhinitis, cause unspecified   . Anxiety state, unspecified   . Arthritis   . Asthma    only when sick   . Backache, unspecified   . Bronchitis    hx of when get sick  . Cancer ([place])    skin cancer , basal cell   . Cancer ([place]) 11/2016   ovarian  . Cervicalgia   . Complication of anesthesia   . Dermatophytosis of nail   . Dysmetabolic syndrome X   . Encounter for long-term (current) use of other medications   . Esophageal reflux   . Insomnia, unspecified   . Leukocytosis, unspecified   . Migraine without aura, without mention of intractable migraine without mention of status migrainosus   . Other and unspecified hyperlipidemia   . Other malaise and fatigue   . Overweight(278.02)   . Personal history of chemotherapy now   ovarian  . PONV (postoperative nausea and vomiting)   . Spinal stenosis in cervical region   . Symptomatic menopausal or female climacteric states   . Unspecified disorder of skin and subcutaneous tissue   . Unspecified vitamin D deficiency     Patient Active Problem List   Diagnosis Date Noted  . 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 Mucositis due to chemotherapy 09/02/2017  . Goals of care, counseling/discussion 08/08/2017  . Hypothyroidism due to medication 07/28/2017  . Genetic testing 12/18/2016  . Migraines 12/01/2016  .  Postoperative seroma of subcutaneous tissue after non-dermatologic procedure 12/01/2016  . Transaminitis 12/01/2016  . Anemia associated with acute blood loss 11/20/2016  . Ovarian cancer, unspecified laterality ([place]) 11/12/2016  . 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 Examination of participant in clinical trial 11/01/2016  . Chronic right-sided low back pain with right-sided sciatica 02/02/2016  . History of neck surgery 08/01/2015  . Hematoma 06/14/2015  . Perennial allergic rhinitis 05/05/2015  . Generalized anxiety disorder 02/03/2015  . Back pain, chronic 08/25/2014  . Insomnia, persistent 08/25/2014  . Chronic cervical pain 08/25/2014  . Major depression, chronic ([place]) 08/25/2014  . Dyslipidemia 08/25/2014  . Gastro-esophageal reflux disease without esophagitis 08/25/2014  . H/O high risk medication treatment 08/25/2014  . Blood glucose elevated 08/25/2014  . Migraine without aura and without status migrainosus, not intractable 08/25/2014  . Climacteric 08/25/2014  . Dysmetabolic syndrome 28/41/3244  . Fungal infection of toenail 08/25/2014  . Obesity (BMI 30.0-34.9) 08/25/2014  . Vitamin D deficiency 08/25/2014  . Engages in travel abroad 08/25/2014  . Cervical disc disorder with radiculopathy 04/28/2013    Past Surgical History:  Procedure Laterality Date  . ABDOMINAL HYSTERECTOMY    . ANTERIOR CERVICAL DECOMP/DISCECTOMY FUSION N/A 06/07/2015   Procedure: Cervical three - four and Cervical six- seven anterior cervical decompression with fusion interbody prosthesis plating and bonegraft;  Surgeon:  Newman Pies, MD;  Location: [place] [facility];  Service: Neurosurgery;  Laterality: N/A;  C34 and C67 anterior cervical decompression with fusion interbody prosthesis plating and bonegraft  . BACK SURGERY     x2 Lower   . EVACUATION OF CERVICAL HEMATOMA N/A 06/14/2015   Procedure: EVACUATION OF CERVICAL HEMATOMA;  Surgeon: Newman Pies, MD;   Location: [place] [facility];  Service: Neurosurgery;  Laterality: N/A;  . NECK SURGERY     x3  . TONSILLECTOMY      Prior to Admission medications   Medication Sig Start Date End Date Taking? Authorizing Provider  albuterol (PROVENTIL HFA;VENTOLIN HFA) 108 (90 Base) MCG/ACT inhaler Inhale 2 puffs into the lungs every 6 (six) hours as needed for wheezing or shortness of breath. 10/04/16   Hubbard Hartshorn, FNP  albuterol (PROVENTIL) (2.5 MG/3ML) 0.083% nebulizer solution Take 3 mLs (2.5 mg total) by nebulization every 6 (six) hours as needed for wheezing or shortness of breath. 05/05/15   Steele Sizer, MD  Ascorbic Acid (VITAMIN C PO) Take 1 tablet by mouth daily.     [provider]  budesonide-formoterol (SYMBICORT) 160-4.5 MCG/ACT inhaler Inhale 2 puffs into the lungs 2 (two) times daily.    [provider]  Cholecalciferol (VITAMIN D-1000 MAX ST) 1000 UNITS tablet Take 1 tablet by mouth 2 (two) times daily.    [provider]  Cyanocobalamin (B-12 PO) Take 1 tablet by mouth daily.     [provider]  cyclobenzaprine (FLEXERIL) 10 MG tablet Take one tablet every 8 hours as needed 08/03/18   Lloyd Huger, MD  DULoxetine (CYMBALTA) 60 MG capsule TAKE 1 CAPSULE(60 MG) BY MOUTH DAILY 06/11/18   Steele Sizer, MD  estradiol (ESTRACE) 0.5 MG tablet Take 1 tablet (0.5 mg total) by mouth daily. 10/28/17   Steele Sizer, MD  ferrous sulfate (IRON SUPPLEMENT) 325 (65 FE) MG tablet Take 1 tablet by mouth daily.    [provider]  fluticasone (FLONASE) 50 MCG/ACT nasal spray Place 2 sprays into both nostrils as needed. 06/11/18 09/09/18  Steele Sizer, MD  gabapentin (NEURONTIN) 300 MG capsule Take 1-3 capsules (300-900 mg total) by mouth 3 (three) times daily. 300 mg twice a day and 900 mg at night 06/11/18   Steele Sizer, MD  ipratropium (ATROVENT HFA) 17 MCG/ACT inhaler Inhale 2 puffs into the lungs every 6 (six) hours as needed.     [provider]  levothyroxine (SYNTHROID) 25 MCG tablet Take 1 tablet (25 mcg total) by mouth daily before breakfast. 07/21/18   Borders, Kirt Boys, NP  lidocaine (XYLOCAINE) 2 % solution Use as directed 15 mLs in the mouth or throat as needed for mouth pain. 11/28/17   Jacquelin Hawking, NP  Lysine 1000 MG TABS Take 1 tablet by mouth daily.     [provider]  Magnesium Oxide 500 MG CAPS Take 500 mg by mouth 2 (two) times daily.     [provider]  Multiple Vitamins-Minerals (MULTIVITAMIN PO) Take 1 tablet by mouth daily.     [provider]  Omega-3 Fatty Acids (FISH OIL PO) Take 1 capsule by mouth 2 (two) times daily.     [provider]  ondansetron (ZOFRAN-ODT) 8 MG disintegrating tablet Take 1 tablet (8 mg total) by mouth every 8 (eight) hours as needed for nausea or vomiting. 07/20/18   Borders, Kirt Boys, NP  oxyCODONE-acetaminophen (PERCOCET) 10-325 MG tablet Take 1 tablet by mouth every 6 (six) hours as  needed for pain. 07/20/18   Borders, Kirt Boys, NP  potassium gluconate 595 MG TABS tablet Take 595 mg by mouth daily.    [provider]  SUMAtriptan (IMITREX) 100 MG tablet May repeat in 2 hours if headache persists or recurs. 06/10/18   Sowles, Drue Stager, MD  temazepam (RESTORIL) 15 MG capsule TAKE 1 CAPSULE(15 MG) BY MOUTH AT BEDTIME AS NEEDED FOR SLEEP 07/13/18   Ancil Boozer, Drue Stager, MD  valACYclovir (VALTREX) 1000 MG tablet TAKE 1 TABLET(1000 MG) BY MOUTH THREE TIMES DAILY FOR 7 DAYS Patient not taking: Reported on 08/03/2018 11/24/17   Lloyd Huger, MD  prochlorperazine (COMPAZINE) 10 MG tablet Take 1 tablet (10 mg total) by mouth every 6 (six) hours as needed (Nausea or vomiting). 12/02/16 08/08/17  Lloyd Huger, MD    Allergies Patient has no known allergies.  Family History  Problem Relation Age of Onset  . Depression Mother   . Migraines Mother   . Dementia Father   . Diabetes Father   . Hyperlipidemia Father   . Hyperlipidemia  Brother   . Hyperlipidemia Brother   . Breast cancer Paternal Aunt        24s    Social History Social History   Tobacco Use  . Smoking status: Former Smoker    Packs/day: 1.50    Years: 20.00    Pack years: 30.00    Types: Cigarettes    Start date: 03/19/1979    Last attempt to quit: 08/25/1999    Years since quitting: 18.9  . Smokeless tobacco: Never Used  . Tobacco comment: smoking cessation materials not required  Substance Use Topics  . Alcohol use: Not Currently    Alcohol/week: 0.0 standard drinks  . Drug use: No    Review of Systems Constitutional: No fever/chills Eyes: No visual changes. ENT: No sore throat. Cardiovascular: Denies chest pain. Respiratory:shortness of breath. Gastrointestinal: No abdominal pain.  No nausea, no vomiting.  No diarrhea.  No constipation. Genitourinary: Negative for dysuria. Musculoskeletal: Negative for back pain. Skin: Negative for rash. Neurological: Negative for headaches, focal weakness  ____________________________________________   PHYSICAL EXAM:  VITAL SIGNS: ED Triage Vitals  Enc Vitals Group     BP      Pulse      Resp      Temp      Temp src      SpO2      Weight      Height      Head Circumference      Peak Flow      Pain Score      Pain Loc      Pain Edu?      Excl. in [place]?     Constitutional: Alert and oriented. Well appearing and in no acute distress. Eyes: Conjunctivae are normal.  Head: Atraumatic. Nose: No congestion/rhinnorhea. Mouth/Throat: Mucous membranes are moist.  Oropharynx non-erythematous. Neck: No stridor.   Cardiovascular: Normal rate, regular rhythm. Grossly normal heart sounds.  Good peripheral circulation. Respiratory: Increased respiratory effort.  Some retractions. Lungs scattered rhonchi Gastrointestinal: Soft and nontender. No distention. No abdominal bruits. No CVA tenderness. Musculoskeletal: No lower extremity tenderness trace bilateral edema.   Neurologic:  Normal speech  and language. No gross focal neurologic deficits are appreciated.  Skin:  Skin is warm, dry and intact. No rash noted.   ____________________________________________   LABS (all labs ordered are listed, but only abnormal results are displayed)  Labs Reviewed  COMPREHENSIVE METABOLIC PANEL - Abnormal;  Notable for the following components:      Result Value   Potassium 5.5 (*)    CO2 20 (*)    Glucose, Bld 148 (*)    BUN 69 (*)    Creatinine, Ser 3.19 (*)    Calcium 8.3 (*)    Albumin 3.3 (*)    AST 49 (*)    GFR calc non Af Amer 15 (*)    GFR calc Af Amer 17 (*)    All other components within normal limits  CBC WITH DIFFERENTIAL/PLATELET - Abnormal; Notable for the following components:   WBC 12.8 (*)    RBC 2.51 (*)    Hemoglobin 8.6 (*)    HCT 26.5 (*)    MCV 105.6 (*)    MCH 34.3 (*)    RDW 23.8 (*)    Platelets 58 (*)    Neutro Abs 10.6 (*)    Monocytes Absolute 1.3 (*)    Abs Immature Granulocytes 0.13 (*)    All other components within normal limits  TROPONIN I - Abnormal; Notable for the following components:   Troponin I 0.06 (*)    All other components within normal limits  BRAIN NATRIURETIC PEPTIDE - Abnormal; Notable for the following components:   B Natriuretic Peptide 1,407.0 (*)    All other components within normal limits  LACTIC ACID, PLASMA - Abnormal; Notable for the following components:   Lactic Acid, Venous 3.4 (*)    All other components within normal limits  FIBRIN DERIVATIVES D-DIMER ([facility] ONLY) - Abnormal; Notable for the following components:   Fibrin derivatives D-dimer [facility]) 1,224.82 (*)    All other components within normal limits  BLOOD GAS, ARTERIAL - Abnormal; Notable for the following components:   pCO2 arterial 31 (*)    pO2, Arterial 127 (*)    Bicarbonate 18.8 (*)    Acid-base deficit 5.1 (*)    All other components within normal limits  SARS CORONAVIRUS 2 (HOSPITAL ORDER, [place] [facility])  LACTIC ACID,  PLASMA  PATHOLOGIST SMEAR REVIEW   ____________________________________________  EKG  EKG read interpreted by me shows sinus tachycardia rate of 106 normal axis no acute ST-T wave changes and computer is reading slight ST segment depression I am really not seeing that.  Baseline is somewhat irregular. ____________________________________________  RADIOLOGY  ED MD interpretation: Straight shows a large heart with what appears to be CHF bilaterally.  I am waiting for the radiologist official report  Official radiology report(s): Dg Chest Portable 1 View  Result Date: 08/06/2018 CLINICAL DATA:  63 year old female with respiratory distress, shortness of breath. Recent weight gain. Hypertensive on presentation. Pending COVID-19 status. History of ovarian cancer. EXAM: PORTABLE CHEST 1 VIEW COMPARISON:  06/29/2018 and earlier. FINDINGS: Portable AP semi upright view at 2001 hours. Stable right chest power port. Stable cardiac size and mediastinal contours. Lower lung volumes with ongoing diffuse pulmonary interstitial opacity, basilar predominance. This is similar to but increased compared to April. No superimposed pneumothorax, pleural effusion or consolidation. Prior cervical ACDF. No acute osseous abnormality identified. IMPRESSION: Lower lung volumes with ongoing diffuse pulmonary interstitial opacity, similar to but increased compared to April. Differential considerations include viral/atypical respiratory infection, pulmonary edema, less likely lymphangitic carcinomatosis. Electronically Signed   By: Genevie Ann M.D.   On: 08/06/2018 20:28    ____________________________________________   PROCEDURES  Procedure(s) performed (including Critical Care):  Procedures   ____________________________________________   INITIAL IMPRESSION / ASSESSMENT AND PLAN / ED COURSE  Initially it sounded like the patient had sudden onset of shortness of breath.  Patient is a cancer patient.  I ordered the  d-dimer.  D-dimer is very high but patient is in renal failure and on unable to get a CT angios.  Patient clinically looks like new onset congestive failure.  I will work on treating that initially.             ____________________________________________   FINAL CLINICAL IMPRESSION(S) / ED DIAGNOSES  Final diagnoses:  Dyspnea, unspecified type  Congestive heart failure, unspecified HF chronicity, unspecified heart failure type ([place])  Chronic renal failure, unspecified CKD stage     ED Discharge Orders    None       Note:  This document was prepared using Dragon voice recognition software and may include unintentional dictation errors.    Nena Polio, MD 08/06/18 2148

## 2018-08-07 ENCOUNTER — Inpatient Hospital Stay: Payer: PPO

## 2018-08-07 ENCOUNTER — Telehealth: Payer: Self-pay | Admitting: Family Medicine

## 2018-08-07 ENCOUNTER — Telehealth: Payer: Self-pay | Admitting: *Deleted

## 2018-08-07 ENCOUNTER — Inpatient Hospital Stay (HOSPITAL_COMMUNITY)
Admit: 2018-08-07 | Discharge: 2018-08-07 | Disposition: A | Discharge status: Home / Self Care | Payer: PPO | Attending: Cardiovascular Disease | Admitting: Cardiovascular Disease

## 2018-08-07 DIAGNOSIS — I248 Other forms of acute ischemic heart disease: Secondary | ICD-10-CM

## 2018-08-07 DIAGNOSIS — C569 Malignant neoplasm of unspecified ovary: Secondary | ICD-10-CM

## 2018-08-07 DIAGNOSIS — I5021 Acute systolic (congestive) heart failure: Secondary | ICD-10-CM

## 2018-08-07 DIAGNOSIS — I340 Nonrheumatic mitral (valve) insufficiency: Secondary | ICD-10-CM

## 2018-08-07 DIAGNOSIS — I420 Dilated cardiomyopathy: Secondary | ICD-10-CM

## 2018-08-07 DIAGNOSIS — I5043 Acute on chronic combined systolic (congestive) and diastolic (congestive) heart failure: Secondary | ICD-10-CM

## 2018-08-07 DIAGNOSIS — D696 Thrombocytopenia, unspecified: Secondary | ICD-10-CM

## 2018-08-07 DIAGNOSIS — N184 Chronic kidney disease, stage 4 (severe): Secondary | ICD-10-CM

## 2018-08-07 DIAGNOSIS — R0602 Shortness of breath: Secondary | ICD-10-CM

## 2018-08-07 DIAGNOSIS — D649 Anemia, unspecified: Secondary | ICD-10-CM

## 2018-08-07 DIAGNOSIS — N179 Acute kidney failure, unspecified: Secondary | ICD-10-CM

## 2018-08-07 DIAGNOSIS — I509 Heart failure, unspecified: Secondary | ICD-10-CM

## 2018-08-07 DIAGNOSIS — N171 Acute kidney failure with acute cortical necrosis: Secondary | ICD-10-CM

## 2018-08-07 LAB — ECHOCARDIOGRAM COMPLETE
Height: 62 in
Weight: 2761.92 oz

## 2018-08-07 LAB — CBC
HCT: 27.2 % — ABNORMAL LOW (ref 36.0–46.0)
Hemoglobin: 8.7 g/dL — ABNORMAL LOW (ref 12.0–15.0)
MCH: 34.4 pg — ABNORMAL HIGH (ref 26.0–34.0)
MCHC: 32 g/dL (ref 30.0–36.0)
MCV: 107.5 fL — ABNORMAL HIGH (ref 80.0–100.0)
Platelets: 49 10*3/uL — ABNORMAL LOW (ref 150–400)
RBC: 2.53 MIL/uL — ABNORMAL LOW (ref 3.87–5.11)
RDW: 24 % — ABNORMAL HIGH (ref 11.5–15.5)
WBC: 13 10*3/uL — ABNORMAL HIGH (ref 4.0–10.5)
nRBC: 0 % (ref 0.0–0.2)

## 2018-08-07 LAB — BASIC METABOLIC PANEL
Anion gap: 11 (ref 5–15)
BUN: 73 mg/dL — ABNORMAL HIGH (ref 8–23)
CO2: 21 mmol/L — ABNORMAL LOW (ref 22–32)
Calcium: 8.4 mg/dL — ABNORMAL LOW (ref 8.9–10.3)
Chloride: 107 mmol/L (ref 98–111)
Creatinine, Ser: 3.42 mg/dL — ABNORMAL HIGH (ref 0.44–1.00)
GFR calc Af Amer: 16 mL/min — ABNORMAL LOW (ref >60)
GFR calc non Af Amer: 14 mL/min — ABNORMAL LOW (ref >60)
Glucose, Bld: 119 mg/dL — ABNORMAL HIGH (ref 70–99)
Potassium: 5.5 mmol/L — ABNORMAL HIGH (ref 3.5–5.1)
Sodium: 139 mmol/L (ref 135–145)

## 2018-08-07 LAB — LACTIC ACID, PLASMA
Lactic Acid, Venous: 3.1 mmol/L (ref 0.5–1.9)
Lactic Acid, Venous: 2.9 mmol/L (ref 0.5–1.9)
Lactic Acid, Venous: 2 mmol/L (ref 0.5–1.9)

## 2018-08-07 LAB — TROPONIN I
Troponin I: 1.1 ng/mL (ref <0.03)
Troponin I: 0.81 ng/mL (ref <0.03)
Troponin I: 0.35 ng/mL (ref <0.03)

## 2018-08-07 LAB — BRAIN NATRIURETIC PEPTIDE: B Natriuretic Peptide: 2727 pg/mL — ABNORMAL HIGH (ref 0.0–100.0)

## 2018-08-07 LAB — MRSA PCR SCREENING: MRSA by PCR: NEGATIVE

## 2018-08-07 LAB — PROCALCITONIN: Procalcitonin: 0.51 ng/mL

## 2018-08-07 LAB — GLUCOSE, CAPILLARY: Glucose-Capillary: 111 mg/dL — ABNORMAL HIGH (ref 70–99)

## 2018-08-07 LAB — PATHOLOGIST SMEAR REVIEW

## 2018-08-07 MED ORDER — LEVOTHYROXINE SODIUM 25 MCG PO TABS
25.0000 ug | ORAL_TABLET | Freq: Every day | ORAL | Status: DC
  Administered 2018-08-07 – 2018-08-08 (×2): 25 ug via ORAL
  Filled 2018-08-07 (×2): qty 1

## 2018-08-07 MED ORDER — OXYCODONE-ACETAMINOPHEN 5-325 MG PO TABS
1.0000 | ORAL_TABLET | Freq: Four times a day (QID) | ORAL | Status: DC | PRN
  Administered 2018-08-07 – 2018-08-10 (×6): 1 via ORAL
  Filled 2018-08-07 (×6): qty 1

## 2018-08-07 MED ORDER — CYCLOBENZAPRINE HCL 10 MG PO TABS
10.0000 mg | ORAL_TABLET | Freq: Three times a day (TID) | ORAL | Status: DC | PRN
  Filled 2018-08-07: qty 1

## 2018-08-07 MED ORDER — HYDRALAZINE HCL 20 MG/ML IJ SOLN
10.0000 mg | INTRAMUSCULAR | Status: DC | PRN
  Administered 2018-08-08: 10 mg via INTRAVENOUS
  Filled 2018-08-07: qty 1

## 2018-08-07 MED ORDER — LABETALOL HCL 5 MG/ML IV SOLN
20.0000 mg | Freq: Four times a day (QID) | INTRAVENOUS | Status: DC | PRN
  Administered 2018-08-07 – 2018-08-13 (×4): 20 mg via INTRAVENOUS
  Administered 2018-08-14: 10 mg via INTRAVENOUS
  Administered 2018-08-17 – 2018-08-18 (×2): 20 mg via INTRAVENOUS
  Filled 2018-08-07 (×6): qty 4

## 2018-08-07 MED ORDER — FLUTICASONE PROPIONATE 50 MCG/ACT NA SUSP
2.0000 | NASAL | Status: DC | PRN
  Administered 2018-08-12: 2 via NASAL
  Filled 2018-08-07 (×2): qty 16

## 2018-08-07 MED ORDER — MOMETASONE FURO-FORMOTEROL FUM 200-5 MCG/ACT IN AERO
2.0000 | INHALATION_SPRAY | Freq: Two times a day (BID) | RESPIRATORY_TRACT | Status: DC
  Administered 2018-08-07 – 2018-08-18 (×20): 2 via RESPIRATORY_TRACT
  Filled 2018-08-07: qty 8.8

## 2018-08-07 MED ORDER — FUROSEMIDE 10 MG/ML IJ SOLN
40.0000 mg | Freq: Every day | INTRAMUSCULAR | Status: DC
  Administered 2018-08-07 – 2018-08-08 (×2): 40 mg via INTRAVENOUS
  Filled 2018-08-07 (×2): qty 4

## 2018-08-07 MED ORDER — GABAPENTIN 300 MG PO CAPS
300.0000 mg | ORAL_CAPSULE | Freq: Three times a day (TID) | ORAL | Status: DC
  Administered 2018-08-07 – 2018-08-09 (×7): 300 mg via ORAL
  Filled 2018-08-07 (×7): qty 1

## 2018-08-07 MED ORDER — LIDOCAINE VISCOUS HCL 2 % MT SOLN
15.0000 mL | OROMUCOSAL | Status: DC | PRN
  Administered 2018-08-07: 15 mL via OROMUCOSAL
  Filled 2018-08-07 (×2): qty 15

## 2018-08-07 MED ORDER — MORPHINE SULFATE (PF) 2 MG/ML IV SOLN
1.0000 mg | INTRAVENOUS | Status: DC | PRN
  Administered 2018-08-07 – 2018-08-09 (×3): 2 mg via INTRAVENOUS
  Filled 2018-08-07 (×3): qty 1

## 2018-08-07 MED ORDER — DULOXETINE HCL 30 MG PO CPEP
60.0000 mg | ORAL_CAPSULE | Freq: Every day | ORAL | Status: DC
  Administered 2018-08-07 – 2018-08-13 (×7): 60 mg via ORAL
  Filled 2018-08-07 (×7): qty 2

## 2018-08-07 MED ORDER — IPRATROPIUM-ALBUTEROL 0.5-2.5 (3) MG/3ML IN SOLN
3.0000 mL | RESPIRATORY_TRACT | Status: DC | PRN
  Administered 2018-08-12 – 2018-08-13 (×2): 3 mL via RESPIRATORY_TRACT
  Filled 2018-08-07 (×2): qty 3

## 2018-08-07 MED ORDER — TEMAZEPAM 15 MG PO CAPS
15.0000 mg | ORAL_CAPSULE | Freq: Every evening | ORAL | Status: DC | PRN
  Administered 2018-08-07 – 2018-08-11 (×2): 15 mg via ORAL
  Filled 2018-08-07 (×2): qty 1

## 2018-08-07 MED ORDER — OXYCODONE-ACETAMINOPHEN 10-325 MG PO TABS: 1.0000 | ORAL_TABLET | Freq: Four times a day (QID) | ORAL | Status: DC | PRN

## 2018-08-07 MED ORDER — OXYCODONE HCL 5 MG PO TABS
5.0000 mg | ORAL_TABLET | Freq: Four times a day (QID) | ORAL | Status: DC | PRN
  Administered 2018-08-07 – 2018-08-17 (×17): 5 mg via ORAL
  Filled 2018-08-07 (×18): qty 1

## 2018-08-07 MED ORDER — HEPARIN SODIUM (PORCINE) 5000 UNIT/ML IJ SOLN
5000.0000 [IU] | Freq: Three times a day (TID) | INTRAMUSCULAR | Status: DC
  Administered 2018-08-07: 5000 [IU] via SUBCUTANEOUS
  Filled 2018-08-07: qty 1

## 2018-08-07 MED ORDER — FERROUS SULFATE 325 (65 FE) MG PO TABS
325.0000 mg | ORAL_TABLET | Freq: Every day | ORAL | Status: DC
  Administered 2018-08-07 – 2018-08-16 (×9): 325 mg via ORAL
  Filled 2018-08-07 (×10): qty 1

## 2018-08-07 NOTE — H&P (Addendum)
[facility] NAME: Jessica Burgess    MR#:  [id number]  DATE OF BIRTH:  06-20-1955  DATE OF ADMISSION:  08/06/2018  PRIMARY CARE PHYSICIAN: Steele Sizer, MD   REQUESTING/REFERRING PHYSICIAN: Conni Slipper, MD  CHIEF COMPLAINT:   Chief Complaint  Patient presents with   Shortness of Breath    HISTORY OF PRESENT ILLNESS:  Jessica Burgess  is a 63 y.o. female with a known history of ovarian cancer, CKD, hypothyroidism, hypertension and anemia.  Patient is currently undergoing chemotherapy followed by Dr. Mellody Drown and Dr. Grayland Ormond.  Patient reports having had chemotherapy infusion on yesterday however over the last 3 days she has received infusion of packed red blood cells as well as 2 L IV fluids according to the patient report.  She now presents to the emergency room with a 2-day history of increased shortness of breath and nonproductive cough.  Shortness of breath is made worse when lying flat.  She denies edema.  She denies chest pain.  She denies abdominal pain.  Patient denies fevers, chills, nausea, vomiting, or diarrhea.  Patient denies a prior known history of CHF.  However she is aware of renal failure occurring since March 2020.  CT abdomen was completed which demonstrated no evidence of hydronephrosis or urinary stone disease.  Chest x-ray demonstrated no significant interval change in cardiomegaly with mild vascular congestion and diffuse interstitial opacity.  No pleural effusion or pneumothorax identified.  However, patient developed increased shortness of breath while in the emergency room and was therefore placed on BiPAP therapy to decrease work of breathing.  Lactic acid is 3.4.  Blood and sputum cultures are being completed as well as urine culture.  No other evidence of infectious process at this time.  Will repeat lactic acid.  BNP is 1407.  BUN is 69 with creatinine 3.19.  Potassium is 5.5.  However, patient has  been taking calcium gluconate and is currently receiving Lasix.  Will repeat potassium level in the a.m. and treat as as necessary.  Patient is being admitted to stepdown ICU for close monitoring.  Detailed report has been called to the nurse practitioner/intensivist for continued care.   PAST MEDICAL HISTORY:   Past Medical History:  Diagnosis Date   Allergic rhinitis, cause unspecified    Anxiety state, unspecified    Arthritis    Asthma    only when sick    Backache, unspecified    Bronchitis    hx of when get sick   Cancer [facility])    skin cancer , basal cell    Cancer ([facility]) 11/2016   ovarian   Cervicalgia    Complication of anesthesia    Dermatophytosis of nail    Dysmetabolic syndrome X    Encounter for long-term (current) use of other medications    Esophageal reflux    Insomnia, unspecified    Leukocytosis, unspecified    Migraine without aura, without mention of intractable migraine without mention of status migrainosus    Other and unspecified hyperlipidemia    Other malaise and fatigue    Overweight(278.02)    Personal history of chemotherapy now   ovarian   PONV (postoperative nausea and vomiting)    Spinal stenosis in cervical region    Symptomatic menopausal or female climacteric states    Unspecified disorder of skin and subcutaneous tissue    Unspecified vitamin D deficiency     PAST SURGICAL HISTORY:   Past Surgical  History:  Procedure Laterality Date   ABDOMINAL HYSTERECTOMY     ANTERIOR CERVICAL DECOMP/DISCECTOMY FUSION N/A 06/07/2015   Procedure: Cervical three - four and Cervical six- seven anterior cervical decompression with fusion interbody prosthesis plating and bonegraft;  Surgeon: Newman Pies, MD;  Location: [facility];  Service: Neurosurgery;  Laterality: N/A;  C34 and C67 anterior cervical decompression with fusion interbody prosthesis plating and bonegraft   BACK SURGERY     x2 Lower    EVACUATION OF  CERVICAL HEMATOMA N/A 06/14/2015   Procedure: EVACUATION OF CERVICAL HEMATOMA;  Surgeon: Newman Pies, MD;  Location: [place] [facility];  Service: Neurosurgery;  Laterality: N/A;   NECK SURGERY     x3   TONSILLECTOMY      SOCIAL HISTORY:   Social History   Tobacco Use   Smoking status: Former Smoker    Packs/day: 1.50    Years: 20.00    Pack years: 30.00    Types: Cigarettes    Start date: 03/19/1979    Last attempt to quit: 08/25/1999    Years since quitting: 18.9   Smokeless tobacco: Never Used   Tobacco comment: smoking cessation materials not required  Substance Use Topics   Alcohol use: Not Currently    Alcohol/week: 0.0 standard drinks    FAMILY HISTORY:   Family History  Problem Relation Age of Onset   Depression Mother    Migraines Mother    Dementia Father    Diabetes Father    Hyperlipidemia Father    Hyperlipidemia Brother    Hyperlipidemia Brother    Breast cancer Paternal Aunt        33s    DRUG ALLERGIES:  No Known Allergies  REVIEW OF SYSTEMS:   Review of Systems  Constitutional: Positive for malaise/fatigue. Negative for chills, diaphoresis and fever.  HENT: Positive for congestion. Negative for sinus pain and sore throat.   Eyes: Negative for blurred vision and double vision.  Respiratory: Positive for cough and shortness of breath. Negative for hemoptysis, sputum production and wheezing.   Cardiovascular: Positive for orthopnea. Negative for chest pain, palpitations and leg swelling.  Gastrointestinal: Negative for abdominal pain, blood in stool, constipation, diarrhea, nausea and vomiting.  Genitourinary: Negative for dysuria, flank pain, frequency and hematuria.  Musculoskeletal: Positive for myalgias. Negative for falls and joint pain.  Skin: Negative for itching and rash.  Neurological: Positive for weakness (general). Negative for dizziness, focal weakness and loss of consciousness.  Psychiatric/Behavioral: Negative.        MEDICATIONS AT HOME:   Prior to Admission medications   Medication Sig Start Date End Date Taking? Authorizing Provider  Ascorbic Acid (VITAMIN C PO) Take 1 tablet by mouth daily.    Yes [provider]  budesonide-formoterol (SYMBICORT) 160-4.5 MCG/ACT inhaler Inhale 2 puffs into the lungs 2 (two) times daily.   Yes [provider]  Cholecalciferol (VITAMIN D-1000 MAX ST) 1000 UNITS tablet Take 1 tablet by mouth 2 (two) times daily.   Yes [provider]  Cyanocobalamin (B-12 PO) Take 1 tablet by mouth daily.    Yes [provider]  DULoxetine (CYMBALTA) 60 MG capsule TAKE 1 CAPSULE(60 MG) BY MOUTH DAILY 06/11/18  Yes Sowles, Drue Stager, MD  estradiol (ESTRACE) 0.5 MG tablet Take 1 tablet (0.5 mg total) by mouth daily. 10/28/17  Yes Sowles, Drue Stager, MD  ferrous sulfate (IRON SUPPLEMENT) 325 (65 FE) MG tablet Take 1 tablet by mouth daily.   Yes [provider]  gabapentin (NEURONTIN) 300  MG capsule Take 1-3 capsules (300-900 mg total) by mouth 3 (three) times daily. 300 mg twice a day and 900 mg at night 06/11/18  Yes Sowles, Drue Stager, MD  ipratropium (ATROVENT HFA) 17 MCG/ACT inhaler Inhale 2 puffs into the lungs every 6 (six) hours as needed.    Yes [provider]  levothyroxine (SYNTHROID) 25 MCG tablet Take 1 tablet (25 mcg total) by mouth daily before breakfast. 07/21/18  Yes Borders, Kirt Boys, NP  Lysine 1000 MG TABS Take 1 tablet by mouth daily.    Yes [provider]  Magnesium Oxide 500 MG CAPS Take 500 mg by mouth 2 (two) times daily.    Yes [provider]  Multiple Vitamins-Minerals (MULTIVITAMIN PO) Take 1 tablet by mouth daily.    Yes [provider]  Omega-3 Fatty Acids (FISH OIL PO) Take 1 capsule by mouth 2 (two) times daily.    Yes [provider]  oxyCODONE-acetaminophen (PERCOCET) 10-325 MG tablet Take 1 tablet by mouth every 6 (six) hours as needed for pain. 07/20/18  Yes Borders, Kirt Boys, NP  potassium gluconate 595 MG TABS tablet Take 595 mg by mouth daily.   Yes [provider]  temazepam (RESTORIL) 15 MG capsule TAKE 1 CAPSULE(15 MG) BY MOUTH AT BEDTIME AS NEEDED FOR SLEEP 07/13/18  Yes Sowles, Drue Stager, MD  albuterol (PROVENTIL HFA;VENTOLIN HFA) 108 (90 Base) MCG/ACT inhaler Inhale 2 puffs into the lungs every 6 (six) hours as needed for wheezing or shortness of breath. 10/04/16   Hubbard Hartshorn, FNP  albuterol (PROVENTIL) (2.5 MG/3ML) 0.083% nebulizer solution Take 3 mLs (2.5 mg total) by nebulization every 6 (six) hours as needed for wheezing or shortness of breath. 05/05/15   Steele Sizer, MD  cyclobenzaprine (FLEXERIL) 10 MG tablet Take one tablet every 8 hours as needed 08/03/18   Lloyd Huger, MD  fluticasone [facility]) 50 MCG/ACT nasal spray Place 2 sprays into both nostrils as needed. 06/11/18 09/09/18  Steele Sizer, MD  lidocaine (XYLOCAINE) 2 % solution Use as directed 15 mLs in the mouth or throat as needed for mouth pain. 11/28/17   Jacquelin Hawking, NP  ondansetron (ZOFRAN-ODT) 8 MG disintegrating tablet Take 1 tablet (8 mg total) by mouth every 8 (eight) hours as needed for nausea or vomiting. 07/20/18   Borders, Kirt Boys, NP  SUMAtriptan (IMITREX) 100 MG tablet May repeat in 2 hours if headache persists or recurs. 06/10/18   Steele Sizer, MD  valACYclovir (VALTREX) 1000 MG tablet TAKE 1 TABLET(1000 MG) BY MOUTH THREE TIMES DAILY FOR 7 DAYS Patient not taking: Reported on 08/03/2018 11/24/17   Lloyd Huger, MD  prochlorperazine (COMPAZINE) 10 MG tablet Take 1 tablet (10 mg total) by mouth every 6 (six) hours as needed (Nausea or vomiting). 12/02/16 08/08/17  Lloyd Huger, MD      VITAL SIGNS:  Blood pressure (!) 174/103, pulse (!) 116, temperature 98.5 F (36.9 C), temperature source Oral, resp. rate 20, height 5\' 2"  (1.575 m), weight 79.4 kg, SpO2 100 %.  PHYSICAL EXAMINATION:  Physical Exam Constitutional:      General: She is in  acute distress (Increased WOB).     Appearance: She is normal weight. She is ill-appearing. She is not diaphoretic.  HENT:     Head: Normocephalic and atraumatic.     Mouth/Throat:     Mouth: Mucous membranes are moist.  Eyes:     Extraocular Movements: Extraocular movements intact.     Pupils: Pupils  are equal, round, and reactive to light.  Neck:     Musculoskeletal: Normal range of motion and neck supple.     Vascular: No JVD.  Cardiovascular:     Rate and Rhythm: Normal rate and regular rhythm.     Pulses: Normal pulses.     Heart sounds: Normal heart sounds. No murmur. No friction rub. No gallop.   Pulmonary:     Effort: Accessory muscle usage present.     Breath sounds: Examination of the right-lower field reveals rales. Rales present.  Chest:     Chest wall: No tenderness.  Abdominal:     Palpations: Abdomen is soft.     Tenderness: There is abdominal tenderness (lower abdominal). There is no guarding or rebound.  Musculoskeletal: Normal range of motion.     Right lower leg: She exhibits no tenderness. No edema.     Left lower leg: She exhibits no tenderness. No edema.  Lymphadenopathy:     Cervical: No cervical adenopathy.  Skin:    General: Skin is warm and dry.     Capillary Refill: Capillary refill takes less than 2 seconds.     Findings: No erythema or rash.  Neurological:     General: No focal deficit present.     Mental Status: She is alert and oriented to person, place, and time.     Motor: No weakness.  Psychiatric:        Mood and Affect: Mood normal.        Behavior: Behavior normal.     GENERAL:  63 y.o.-year-old patient lying in the bed with no acute distress.  EYES: Pupils equal, round, reactive to light and accommodation. No scleral icterus. Extraocular muscles intact.  HEENT: Head atraumatic, normocephalic. Oropharynx and nasopharynx clear.  NECK:  Supple, no jugular venous distention. No thyroid enlargement, no tenderness.  LUNGS: Normal breath  sounds bilaterally, no wheezing, rales,rhonchi or crepitation. No use of accessory muscles of respiration.  CARDIOVASCULAR: Regular rate and rhythm, S1, S2 normal. No murmurs, rubs, or gallops.  ABDOMEN: Soft, nondistended, nontender. Bowel sounds present. No organomegaly or mass.  EXTREMITIES: No pedal edema, cyanosis, or clubbing.  NEUROLOGIC: Cranial nerves II through XII are intact. Muscle strength 5/5 in all extremities. Sensation intact. Gait not checked.  PSYCHIATRIC: The patient is alert and oriented x 3.  Normal affect and good eye contact. SKIN: No obvious rash, lesion, or ulcer.   LABORATORY PANEL:   CBC Recent Labs  Lab 08/06/18 2014  WBC 12.8*  HGB 8.6*  HCT 26.5*  PLT 58*   ------------------------------------------------------------------------------------------------------------------  Chemistries  Recent Labs  Lab 08/06/18 2014  NA 138  K 5.5*  CL 107  CO2 20*  GLUCOSE 148*  BUN 69*  CREATININE 3.19*  CALCIUM 8.3*  AST 49*  ALT 22  ALKPHOS 66  BILITOT 1.2   ------------------------------------------------------------------------------------------------------------------  Cardiac Enzymes Recent Labs  Lab 08/06/18 2014  TROPONINI 0.06*   ------------------------------------------------------------------------------------------------------------------  RADIOLOGY:  Dg Chest Portable 1 View  Result Date: 08/06/2018 CLINICAL DATA:  Shortness of breath EXAM: PORTABLE CHEST 1 VIEW COMPARISON:  08/06/2018, 06/29/2018, PET-CT 05/07/2018 FINDINGS: Surgical hardware in the cervical spine. Right-sided central venous port tip over the SVC. Cardiomegaly with vascular congestion. Diffuse interstitial opacity, no significant change as compared with radiograph performed earlier today. No pleural effusion or pneumothorax. IMPRESSION: No significant interval change in cardiomegaly, mild vascular congestion and diffuse interstitial opacity since radiograph performed  earlier today. Electronically Signed   By: Madie Reno.D.  On: 08/06/2018 23:40   Dg Chest Portable 1 View  Result Date: 08/06/2018 CLINICAL DATA:  63 year old female with respiratory distress, shortness of breath. Recent weight gain. Hypertensive on presentation. Pending COVID-19 status. History of ovarian cancer. EXAM: PORTABLE CHEST 1 VIEW COMPARISON:  06/29/2018 and earlier. FINDINGS: Portable AP semi upright view at 2001 hours. Stable right chest power port. Stable cardiac size and mediastinal contours. Lower lung volumes with ongoing diffuse pulmonary interstitial opacity, basilar predominance. This is similar to but increased compared to April. No superimposed pneumothorax, pleural effusion or consolidation. Prior cervical ACDF. No acute osseous abnormality identified. IMPRESSION: Lower lung volumes with ongoing diffuse pulmonary interstitial opacity, similar to but increased compared to April. Differential considerations include viral/atypical respiratory infection, pulmonary edema, less likely lymphangitic carcinomatosis. Electronically Signed   By: Genevie Ann M.D.   On: 08/06/2018 20:28   Ct Renal Stone Study  Result Date: 08/06/2018 CLINICAL DATA:  History of ovarian cancer. Congestive heart failure. Decreasing GFR. Evaluate for renal obstruction. EXAM: CT ABDOMEN AND PELVIS WITHOUT CONTRAST TECHNIQUE: Multidetector CT imaging of the abdomen and pelvis was performed following the standard protocol without IV contrast. COMPARISON:  05/07/2018 PET-CT. 02/02/2018 CT abdomen and pelvis. FINDINGS: Lower chest: Interlobular septal thickening and hazy central ground-glass opacifications of the lung bases. Small bilateral pleural effusions. Mild cardiomegaly. Hepatobiliary: No focal liver abnormality is seen. Status post cholecystectomy. No biliary dilatation. Pancreas: Unremarkable. No pancreatic ductal dilatation or surrounding inflammatory changes. Spleen: Normal in size without focal abnormality.  Adrenals/Urinary Tract: Adrenal glands are unremarkable. Kidneys are normal, without renal calculi, focal lesion, or hydronephrosis. Bladder is unremarkable. Stomach/Bowel: Stomach is within normal limits. Appendix appears normal. No evidence of bowel wall thickening, distention, or inflammatory changes. Vascular/Lymphatic: Aortic atherosclerosis. No enlarged abdominal or pelvic lymph nodes. Reproductive: Status post hysterectomy. No adnexal masses. Other: Supraumbilical hernia containing fat is mildly increased in size. Stable postsurgical changes within the ventral abdominal wall. Nodularity of the left upper quadrant omentum is stable from prior PET-CT (series 2, image 33). Small volume of ascites. Mild edema within the subcutaneous soft tissues. Musculoskeletal: No fracture is seen. L4-5 posterior instrumented fusion and laminectomy chronic postsurgical changes. No apparent hardware related complication. IMPRESSION: 1. No hydronephrosis or urinary stone disease identified. Unremarkable non-contrast CT appearance of the kidneys. 2. Interstitial and alveolar pulmonary edema, small bilateral pleural effusions, small volume of ascites, mild edema within subcutaneous soft tissues. 3. Stable nodularity of the left upper quadrant omentum from prior PET-CT. Electronically Signed   By: Kristine Garbe M.D.   On: 08/06/2018 22:31      IMPRESSION AND PLAN:   1.  Acute diastolic CHF-BNP 4132 - Patient started on Lasix 40 mg IV twice daily - We will get echocardiogram - Placed on BiPAP therapy for increased work of breathing - Will repeat chest x-ray in the a.m. - We will continue to trend troponin levels - Pete EKG in the a.m. - We will consult cardiology for further evaluation and recommendations with a history of renal failure as well  2.  Acute on chronic renal failure - We will continue to monitor renal function closely particularly given initiation of diuretics for acute CHF -We will get  renal ultrasound - We will consult nephrology for further evaluation and recommendations  3.  Hyperkalemia - Potassium level is 5.5. - Will hold potassium gluconate and repeat potassium level in the a.m. as patient is receiving IV diuretic therapy. - Patient is on telemetry monitoring in the stepdown ICU  4.  High-grade ovarian cancer - Patient is status post chemotherapy - We will consult oncologist/hematologist, Dr. Grayland Ormond for recommendations  5.  Lactic acidosis- 3.4 lactic acid level - Will repeat lactic acid level as patient is diuresed.  No other evidence of infectious process.  Blood, urine, and sputum cultures are pending.  Will treat as indicated  6.  Hypothyroidism - TSH pending - Synthroid continued  DVT and PPI prophylaxis have been initiated.  Patient has been admitted to ICU stepdown with detailed report called to intensivist for continued care     All the records are reviewed and case discussed with ED provider. The plan of care was discussed in details with the patient (and family). I answered all questions. The patient agreed to proceed with the above mentioned plan. Further management will depend upon hospital course.   CODE STATUS: Full code  TOTAL TIME TAKING CARE OF THIS PATIENT: 45 minutes.    Arthur on 08/07/2018 at 1:32 AM  Pager - [phone]  After 6pm go to [email or web address] - [employer] [facility]  Office  [phone]  CC: Primary care physician; Steele Sizer, MD   Attending physician admission note:  I have seen and examined the patient with Ms. Gardiner Barefoot, CRNP on 08/06/2018.  The patient presents with dyspnea over the last couple of days with paroxysmal nocturnal dyspnea and lower extremity edema.  BNP was elevated and troponin I was 0.06.  No fever or chills.  Upon physical examination:  Generally: Pleasant elderly Caucasian female in respiratory distress with conversational  dyspnea on BiPAP. Cardiovascular: Regular rate and rhythm with normal S1-S2 and no murmurs gallops or rubs. Respiratory: Diminished bibasilar breath sounds with bibasal rales abdomen: Soft, nontender, nondistended with positive bowel sounds and no palpable organomegaly or masses. Extremities: No edema clubbing or cyanosis  Labs and radiographic studies: were all reviewed  Assessment/plan: The patient will be admitted to a stepdown unit given her acute respiratory failure requiring BiPAP, secondary to acute likely diastolic CHF.  Will diurese with IV Lasix and obtain 2D echo and a cardiology consult in a.m. as well as serial cardiac enzymes.  Will follow renal functions with diuresis given her acute kidney injury on top of chronic kidney disease  For further details please refer to dictated admission H&P.  I have discussed the case with my nurse practitioner. I agree with the admission note and the rest of the plan of care as delineated by Ms. Gardiner Barefoot, CRNP.    Note: This dictation was prepared with Dragon dictation along with smaller phrase technology. Any transcriptional errors that result from this process are unintentional.

## 2018-08-07 NOTE — TOC Initial Note (Signed)
Transition of Care [facility]) - Initial/Assessment Note    Patient Details  Name: Jessica Burgess MRN: [id number] Date of Birth: 1955/03/19  Transition of Care [facility]) CM/SW Contact:    Shelbie Hutching, RN Phone Number: 08/07/2018, 1:33 PM  Clinical Narrative:                 Patient is from home and lives with a friend.  Patient reports that she and the friend have been roommates for 25 years.  Patient reports she is very independent, does not require any assistive devices, and drives.   Patient reports that she has never been diagnosed with any heart problems in the past.  Patient does have a scale at home but she does not qualify for home health as she is not home bound and is able to go to appointments.   Expected Discharge Plan: Home/Self Care Barriers to Discharge: Continued Medical Work up   Patient Goals and CMS Choice Patient states their goals for this hospitalization and ongoing recovery are:: Wants to go home tomorrow      Expected Discharge Plan and Services Expected Discharge Plan: Home/Self Care   Discharge Planning Services: CM Consult   Living arrangements for the past 2 months: Single Family Home                                      Prior Living Arrangements/Services Living arrangements for the past 2 months: Single Family Home Lives with:: Friends Patient language and need for interpreter reviewed:: No Do you feel safe going back to the place where you live?: Yes      Need for Family Participation in Patient Care: Yes (Comment)(cancer patient) Care giver support system in place?: Yes (comment)(lives with friends)   Criminal Activity/Legal Involvement Pertinent to Current Situation/Hospitalization: No - Comment as needed  Activities of Daily Living      Permission Sought/Granted                  Emotional Assessment Appearance:: Appears stated age Attitude/Demeanor/Rapport: Engaged Affect (typically observed): Accepting Orientation: :  Oriented to Self, Oriented to Place, Oriented to  Time, Oriented to Situation Alcohol / Substance Use: Not Applicable Psych Involvement: No (comment)  Admission diagnosis:  Acute CHF ([place]) [I50.9] Elevated d-dimer [R79.89] Dyspnea, unspecified type [R06.00] Chronic renal failure, unspecified CKD stage [N18.9] Congestive heart failure, unspecified HF chronicity, unspecified heart failure type (HCC) [I50.9] Acute CHF (congestive heart failure) ([place]) [I50.9] Patient Active Problem List   Diagnosis Date Noted  . Acute CHF (congestive heart failure) ([place]) 08/07/2018  . Acute CHF ([place]) 08/06/2018  . 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 Mucositis due to chemotherapy 09/02/2017  . Goals of care, counseling/discussion 08/08/2017  . Hypothyroidism due to medication 07/28/2017  . Genetic testing 12/18/2016  . Migraines 12/01/2016  . Postoperative seroma of subcutaneous tissue after non-dermatologic procedure 12/01/2016  . Transaminitis 12/01/2016  . Anemia associated with acute blood loss 11/20/2016  . Ovarian cancer, unspecified laterality ([place]) 11/12/2016  . 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[place]) 11/05/2016  . Examination of participant in clinical trial 11/01/2016  . Chronic right-sided low back pain with right-sided sciatica 02/02/2016  . History of neck surgery 08/01/2015  . Hematoma 06/14/2015  . Perennial allergic rhinitis 05/05/2015  . Generalized anxiety disorder 02/03/2015  . Back pain, chronic 08/25/2014  . Insomnia, persistent 08/25/2014  . Chronic cervical pain  08/25/2014  . Major depression, chronic ([place]) 08/25/2014  . Dyslipidemia 08/25/2014  . Gastro-esophageal reflux disease without esophagitis 08/25/2014  . H/O high risk medication treatment 08/25/2014  . Blood glucose elevated 08/25/2014  . Migraine without aura and without status migrainosus, not intractable 08/25/2014  . Climacteric 08/25/2014  . Dysmetabolic syndrome 29/11/299  . Fungal  infection of toenail 08/25/2014  . Obesity (BMI 30.0-34.9) 08/25/2014  . Vitamin D deficiency 08/25/2014  . Engages in travel abroad 08/25/2014  . Cervical disc disorder with radiculopathy 04/28/2013   PCP:  Steele Sizer, MD Pharmacy:   [facility] [place], [place] [address] [place] [address] Phone: [phone] Fax: [phone]  [facility]) - [place], [place] [address] Phone: [phone] Fax: [phone]     Social Determinants of Health (SDOH) Interventions    Readmission Risk Interventions No flowsheet data found.

## 2018-08-07 NOTE — Progress Notes (Signed)
[place] at [place] NAME: Nadja Lina    MR#:  [id number]  DATE OF BIRTH:  06/19/1955  SUBJECTIVE:   Patient feels a lot better. She is off BiPAP and oxygen. No chest pain. REVIEW OF SYSTEMS:   Review of Systems  Constitutional: Negative for chills, fever and weight loss.  HENT: Negative for ear discharge, ear pain and nosebleeds.   Eyes: Negative for blurred vision, pain and discharge.  Respiratory: Positive for shortness of breath. Negative for sputum production, wheezing and stridor.   Cardiovascular: Negative for chest pain, palpitations, orthopnea and PND.  Gastrointestinal: Negative for abdominal pain, diarrhea, nausea and vomiting.  Genitourinary: Negative for frequency and urgency.  Musculoskeletal: Negative for back pain and joint pain.  Neurological: Positive for weakness. Negative for sensory change, speech change and focal weakness.  Psychiatric/Behavioral: Negative for depression and hallucinations. The patient is not nervous/anxious.    Tolerating Diet:yes Tolerating PT: ambulatory  DRUG ALLERGIES:  No Known Allergies  VITALS:  Blood pressure (!) 168/94, pulse 92, temperature 98.8 F (37.1 C), temperature source Axillary, resp. rate (!) 24, height 5\' 2"  (1.575 m), weight 78.3 kg, SpO2 95 %.  PHYSICAL EXAMINATION:   Physical Exam  GENERAL:  63 y.o.-year-old patient lying in the bed with no acute distress.  EYES: Pupils equal, round, reactive to light and accommodation. No scleral icterus. Extraocular muscles intact.  HEENT: Head atraumatic, normocephalic. Oropharynx and nasopharynx clear.  NECK:  Supple, no jugular venous distention. No thyroid enlargement, no tenderness.  LUNGS: Normal breath sounds bilaterally, no wheezing, few rales,no rhonchi. No use of accessory muscles of respiration.  CARDIOVASCULAR: S1, S2 normal. No murmurs, rubs, or gallops.  ABDOMEN: Soft, nontender, nondistended. Bowel sounds  present. No organomegaly or mass.  EXTREMITIES: No cyanosis, clubbing or edema b/l.    NEUROLOGIC: Cranial nerves II through XII are intact. No focal Motor or sensory deficits b/l.   PSYCHIATRIC:  patient is alert and oriented x 3.  SKIN: No obvious rash, lesion, or ulcer.   LABORATORY PANEL:  CBC Recent Labs  Lab 08/07/18 0340  WBC 13.0*  HGB 8.7*  HCT 27.2*  PLT 49*    Chemistries  Recent Labs  Lab 08/06/18 2014 08/07/18 0340  NA 138 139  K 5.5* 5.5*  CL 107 107  CO2 20* 21*  GLUCOSE 148* 119*  BUN 69* 73*  CREATININE 3.19* 3.42*  CALCIUM 8.3* 8.4*  AST 49*  --   ALT 22  --   ALKPHOS 66  --   BILITOT 1.2  --    Cardiac Enzymes Recent Labs  Lab 08/07/18 0832  TROPONINI 0.81*   RADIOLOGY:  US Venous Img Lower Bilateral  Result Date: 08/07/2018 CLINICAL DATA:  Elevated D-dimer. History of ovarian cancer. Evaluate for DVT. EXAM: BILATERAL LOWER EXTREMITY VENOUS DOPPLER ULTRASOUND TECHNIQUE: Gray-scale sonography with graded compression, as well as color Doppler and duplex ultrasound were performed to evaluate the lower extremity deep venous systems from the level of the common femoral vein and including the common femoral, femoral, profunda femoral, popliteal and calf veins including the posterior tibial, peroneal and gastrocnemius veins when visible. The superficial great saphenous vein was also interrogated. Spectral Doppler was utilized to evaluate flow at rest and with distal augmentation maneuvers in the common femoral, femoral and popliteal veins. COMPARISON:  None. FINDINGS: RIGHT LOWER EXTREMITY Common Femoral Vein: No evidence of thrombus. Normal compressibility, respiratory phasicity and response to augmentation. Saphenofemoral Junction: No evidence of  thrombus. Normal compressibility and flow on color Doppler imaging. Profunda Femoral Vein: No evidence of thrombus. Normal compressibility and flow on color Doppler imaging. Femoral Vein: No evidence of thrombus.  Normal compressibility, respiratory phasicity and response to augmentation. Popliteal Vein: No evidence of thrombus. Normal compressibility, respiratory phasicity and response to augmentation. Calf Veins: No evidence of thrombus. Normal compressibility and flow on color Doppler imaging. Superficial Great Saphenous Vein: No evidence of thrombus. Normal compressibility. Venous Reflux:  None. Other Findings:  None. LEFT LOWER EXTREMITY Common Femoral Vein: No evidence of thrombus. Normal compressibility, respiratory phasicity and response to augmentation. Saphenofemoral Junction: No evidence of thrombus. Normal compressibility and flow on color Doppler imaging. Profunda Femoral Vein: No evidence of thrombus. Normal compressibility and flow on color Doppler imaging. Femoral Vein: No evidence of thrombus. Normal compressibility, respiratory phasicity and response to augmentation. Popliteal Vein: No evidence of thrombus. Normal compressibility, respiratory phasicity and response to augmentation. Calf Veins: No evidence of thrombus. Normal compressibility and flow on color Doppler imaging. Superficial Great Saphenous Vein: No evidence of thrombus. Normal compressibility. Venous Reflux:  None. Other Findings: Note made of an approximately 2.3 x 3.6 x 0.8 cm serpiginous fluid collection with the left popliteal fossa. IMPRESSION: 1. No evidence of DVT within either lower extremity. 2. Incidentally noted approximately 3.6 cm left-sided Baker's cyst. Electronically Signed   By: Sandi Mariscal M.D.   On: 08/07/2018 07:49   Dg Chest Port 1 View  Result Date: 08/07/2018 CLINICAL DATA:  Acute CHF EXAM: PORTABLE CHEST 1 VIEW COMPARISON:  Yesterday FINDINGS: Cardiomegaly and vascular pedicle widening. Continued perihilar predominant interstitial airspace opacity. No evidence of pleural effusion or pneumothorax. Port on the right with tip at the SVC. IMPRESSION: Continued CHF pattern. Electronically Signed   By: Monte Fantasia M.D.    On: 08/07/2018 06:15   Dg Chest Portable 1 View  Result Date: 08/06/2018 CLINICAL DATA:  Shortness of breath EXAM: PORTABLE CHEST 1 VIEW COMPARISON:  08/06/2018, 06/29/2018, PET-CT 05/07/2018 FINDINGS: Surgical hardware in the cervical spine. Right-sided central venous port tip over the SVC. Cardiomegaly with vascular congestion. Diffuse interstitial opacity, no significant change as compared with radiograph performed earlier today. No pleural effusion or pneumothorax. IMPRESSION: No significant interval change in cardiomegaly, mild vascular congestion and diffuse interstitial opacity since radiograph performed earlier today. Electronically Signed   By: Donavan Foil M.D.   On: 08/06/2018 23:40   Dg Chest Portable 1 View  Result Date: 08/06/2018 CLINICAL DATA:  63 year old female with respiratory distress, shortness of breath. Recent weight gain. Hypertensive on presentation. Pending COVID-19 status. History of ovarian cancer. EXAM: PORTABLE CHEST 1 VIEW COMPARISON:  06/29/2018 and earlier. FINDINGS: Portable AP semi upright view at 2001 hours. Stable right chest power port. Stable cardiac size and mediastinal contours. Lower lung volumes with ongoing diffuse pulmonary interstitial opacity, basilar predominance. This is similar to but increased compared to April. No superimposed pneumothorax, pleural effusion or consolidation. Prior cervical ACDF. No acute osseous abnormality identified. IMPRESSION: Lower lung volumes with ongoing diffuse pulmonary interstitial opacity, similar to but increased compared to April. Differential considerations include viral/atypical respiratory infection, pulmonary edema, less likely lymphangitic carcinomatosis. Electronically Signed   By: Genevie Ann M.D.   On: 08/06/2018 20:28   Ct Renal Stone Study  Result Date: 08/06/2018 CLINICAL DATA:  History of ovarian cancer. Congestive heart failure. Decreasing GFR. Evaluate for renal obstruction. EXAM: CT ABDOMEN AND PELVIS WITHOUT  CONTRAST TECHNIQUE: Multidetector CT imaging of the abdomen and pelvis was performed following  the standard protocol without IV contrast. COMPARISON:  05/07/2018 PET-CT. 02/02/2018 CT abdomen and pelvis. FINDINGS: Lower chest: Interlobular septal thickening and hazy central ground-glass opacifications of the lung bases. Small bilateral pleural effusions. Mild cardiomegaly. Hepatobiliary: No focal liver abnormality is seen. Status post cholecystectomy. No biliary dilatation. Pancreas: Unremarkable. No pancreatic ductal dilatation or surrounding inflammatory changes. Spleen: Normal in size without focal abnormality. Adrenals/Urinary Tract: Adrenal glands are unremarkable. Kidneys are normal, without renal calculi, focal lesion, or hydronephrosis. Bladder is unremarkable. Stomach/Bowel: Stomach is within normal limits. Appendix appears normal. No evidence of bowel wall thickening, distention, or inflammatory changes. Vascular/Lymphatic: Aortic atherosclerosis. No enlarged abdominal or pelvic lymph nodes. Reproductive: Status post hysterectomy. No adnexal masses. Other: Supraumbilical hernia containing fat is mildly increased in size. Stable postsurgical changes within the ventral abdominal wall. Nodularity of the left upper quadrant omentum is stable from prior PET-CT (series 2, image 33). Small volume of ascites. Mild edema within the subcutaneous soft tissues. Musculoskeletal: No fracture is seen. L4-5 posterior instrumented fusion and laminectomy chronic postsurgical changes. No apparent hardware related complication. IMPRESSION: 1. No hydronephrosis or urinary stone disease identified. Unremarkable non-contrast CT appearance of the kidneys. 2. Interstitial and alveolar pulmonary edema, small bilateral pleural effusions, small volume of ascites, mild edema within subcutaneous soft tissues. 3. Stable nodularity of the left upper quadrant omentum from prior PET-CT. Electronically Signed   By: Kristine Garbe  M.D.   On: 08/06/2018 22:31   ASSESSMENT AND PLAN:   Kiyonna Tortorelli  is a 63 y.o. female with a known history of ovarian cancer, CKD, hypothyroidism, hypertension and anemia.  Patient is currently undergoing chemotherapy followed by Dr. Mellody Drown and Dr. Grayland Ormond. Presented to the emergency room with a 2-day history of increased shortness of breath and nonproductive cough.  Shortness of breath is made worse when lying flat.    1.  Acute diastolic CHF-BNP 1610 - Patient started on Lasix 40 mg IV twice daily--now on daily lasix IV 40 mg - echocardiogram  left ventricle has mildly reduced systolic function, with an ejection fraction of 45-50%. The cavity size was normal. There is mildly increased left ventricular wall thickness. Concern for anterior/anteroseptal wall hypokinesis - Placed on BiPAP therapy for increased work of breathing--now on RA sats 96% - appreciate cardiology input from Dr Rockey Situ -?v/q scan   2.  Acute on chronic renal failure-III - We will continue to monitor renal function closely particularly given initiation of diuretics for acute CHF -We will get renal ultrasound -?cardiorenal syndrome -creat baslien 1.7 -creat 3.20--3.19--3.4  3.  Hyperkalemia - Potassium level is 5.5. - Will hold potassium gluconate and repeat potassium level in the a.m. as patient is receiving IV diuretic therapy. - Patient is on telemetry monitoring in the stepdown ICU  4.  High-grade ovarian cancer - Patient is status post chemotherapy with gemcitabine - seen by  Dr. Grayland Ormond   5.  Lactic acidosis- 3.4 lactic acid level--3.1--2.0 - Will repeat lactic acid level as patient is diuresed.  No other evidence of infectious process.  Blood, urine, and sputum cultures are pending.  Will treat as indicated  6.  Hypothyroidism - Synthroid continued  7. Anemia of chronic dz -recently received 2 units BT at the cancer center  8. TCP suspected due to chemo Careful watch with pt on  heparin  DVT and PPI prophylaxis have been initiated.    CODE STATUS: full  DVT Prophylaxis: heparin  TOTAL TIME TAKING CARE OF THIS PATIENT: *30* minutes.  >50%  time spent on counselling and coordination of care  POSSIBLE D/C IN *1-2* DAYS, DEPENDING ON CLINICAL CONDITION.  Note: This dictation was prepared with Dragon dictation along with smaller phrase technology. Any transcriptional errors that result from this process are unintentional.  Fritzi Mandes M.D on 08/07/2018 at 2:11 PM  Between 7am to 6pm - Pager - [phone]  After 6pm go to [email or web address] - password [email or web address] [employer] [facility]  Office  [phone]  CC: Primary care physician; Steele Sizer, MDPatient ID: [name], female   DOB: 02-18-1956, 63 y.o.   MRN: [id number]

## 2018-08-07 NOTE — ED Notes (Signed)
Pt found to have taken most of her monitors off in her sleep, including bipap.

## 2018-08-07 NOTE — Consult Note (Signed)
Name: Jessica Burgess MRN: [id number] DOB: November 24, 1955    ADMISSION DATE:  08/06/2018 CONSULTATION DATE: 08/07/2018  REFERRING MD : Dr. Sidney Ace  CHIEF COMPLAINT: Shortness of breath  BRIEF PATIENT DESCRIPTION:  63 year old female with past medical history notable for ovarian cancer and CKD admitted with Acute Hypoxic Respiratory Failure in setting of acute decompensated CHF requiring BiPAP.  D-dimer is significantly elevated raising concern for possible PE. Unable to obtain CTA chest at this time due to renal failure, bilateral venous ultrasounds are pending.  SIGNIFICANT EVENTS  5/22>> admission to stepdown unit  STUDIES:  CT renal stone study 5/21>>1. No hydronephrosis or urinary stone disease identified. Unremarkable non-contrast CT appearance of the kidneys. 2. Interstitial and alveolar pulmonary edema, small bilateral pleural effusions, small volume of ascites, mild edema within subcutaneous soft tissues. 3. Stable nodularity of the left upper quadrant omentum from prior PET-CT. Echocardiogram 5/22>> Bilateral LE Venous Ultrasounds 5/22>>  CULTURES: SARS-CoV-2 5/21>> negative Blood 5/22>> Urine 5/22>> Sputum 5/22>>  ANTIBIOTICS: N/A  HISTORY OF PRESENT ILLNESS:   Jessica Burgess is a [age]-year-old female with a past medical history notable for ovarian cancer, CKD, HTN, Hypothyroidism, and anemia who presents to [facility] ED on 08/06/2018 with complaints of shortness of breath and nonproductive cough.  She reports that the shortness of breath has been progressive over approximately 3 weeks, along with 5 pounds weight gain this past week. She reports that she has received chemotherapy infusion, blood transfusion and IV fluids at the cancer center this week. She denies chest pain, palpitations, edema, fever/chills, or sick contacts. Upon presentation to the ED she was noted to be tachypneic, tachycardic, with increased work of breathing, and hypertensive.  Given her work of breathing,  she was placed on BiPAP.  Initial work-up in the ED revealed BNP 1407, troponin 0.06, lactic acid 3.4, WBC 12.8, d-dimer 3693, hemoglobin 8.6, platelets 58, creatinine 3.19, potassium 5.5.  EKG with no acute ischemic changes.  Chest x-ray reveals cardiomegaly, mild vascular congestion and diffuse interstitial opacities.  She is being admitted to [facility] stepdown unit for further work-up and treatment of Acute Hypoxic Respiratory Failure in the setting of Acute Decompensated CHF requiring BiPAP and AKI.  PCCM is consulted for further management.  PAST MEDICAL HISTORY :   has a past medical history of Allergic rhinitis, cause unspecified, Anxiety state, unspecified, Arthritis, Asthma, Backache, unspecified, Bronchitis, Cancer ([place]), Cancer ([place]) (11/2016), Cervicalgia, Complication of anesthesia, Dermatophytosis of nail, Dysmetabolic syndrome X, Encounter for long-term (current) use of other medications, Esophageal reflux, Insomnia, unspecified, Leukocytosis, unspecified, Migraine without aura, without mention of intractable migraine without mention of status migrainosus, Other and unspecified hyperlipidemia, Other malaise and fatigue, Overweight(278.02), Personal history of chemotherapy (now), PONV (postoperative nausea and vomiting), Spinal stenosis in cervical region, Symptomatic menopausal or female climacteric states, Unspecified disorder of skin and subcutaneous tissue, and Unspecified vitamin D deficiency.  has a past surgical history that includes Neck surgery; Back surgery; Tonsillectomy; Abdominal hysterectomy; Anterior cervical decomp/discectomy fusion (N/A, 06/07/2015); and Evacuation of cercical hematoma (N/A, 06/14/2015). Prior to Admission medications   Medication Sig Start Date End Date Taking? Authorizing Provider  Ascorbic Acid (VITAMIN C PO) Take 1 tablet by mouth daily.    Yes [provider]  budesonide-formoterol (SYMBICORT) 160-4.5 MCG/ACT inhaler Inhale 2 puffs into the lungs 2  (two) times daily.   Yes [provider]  Cholecalciferol (VITAMIN D-1000 MAX ST) 1000 UNITS tablet Take 1 tablet by mouth 2 (two) times daily.   Yes [provider]  Cyanocobalamin (B-12 PO) Take 1 tablet by mouth daily.    Yes [provider]  DULoxetine (CYMBALTA) 60 MG capsule TAKE 1 CAPSULE(60 MG) BY MOUTH DAILY 06/11/18  Yes Sowles, Drue Stager, MD  estradiol (ESTRACE) 0.5 MG tablet Take 1 tablet (0.5 mg total) by mouth daily. 10/28/17  Yes Sowles, Drue Stager, MD  ferrous sulfate (IRON SUPPLEMENT) 325 (65 FE) MG tablet Take 1 tablet by mouth daily.   Yes [provider]  gabapentin (NEURONTIN) 300 MG capsule Take 1-3 capsules (300-900 mg total) by mouth 3 (three) times daily. 300 mg twice a day and 900 mg at night 06/11/18  Yes Sowles, Drue Stager, MD  ipratropium (ATROVENT HFA) 17 MCG/ACT inhaler Inhale 2 puffs into the lungs every 6 (six) hours as needed.    Yes [provider]  levothyroxine (SYNTHROID) 25 MCG tablet Take 1 tablet (25 mcg total) by mouth daily before breakfast. 07/21/18  Yes Borders, Kirt Boys, NP  Lysine 1000 MG TABS Take 1 tablet by mouth daily.    Yes [provider]  Magnesium Oxide 500 MG CAPS Take 500 mg by mouth 2 (two) times daily.    Yes [provider]  Multiple Vitamins-Minerals (MULTIVITAMIN PO) Take 1 tablet by mouth daily.    Yes [provider]  Omega-3 Fatty Acids (FISH OIL PO) Take 1 capsule by mouth 2 (two) times daily.    Yes [provider]  oxyCODONE-acetaminophen (PERCOCET) 10-325 MG tablet Take 1 tablet by mouth every 6 (six) hours as needed for pain. 07/20/18  Yes Borders, Kirt Boys, NP  potassium gluconate 595 MG TABS tablet Take 595 mg by mouth daily.   Yes [provider]  temazepam (RESTORIL) 15 MG capsule TAKE 1 CAPSULE(15 MG) BY MOUTH AT BEDTIME AS NEEDED FOR SLEEP 07/13/18  Yes Sowles, Drue Stager, MD  albuterol (PROVENTIL HFA;VENTOLIN HFA) 108 (90 Base) MCG/ACT inhaler Inhale 2  puffs into the lungs every 6 (six) hours as needed for wheezing or shortness of breath. 10/04/16   Hubbard Hartshorn, FNP  albuterol (PROVENTIL) (2.5 MG/3ML) 0.083% nebulizer solution Take 3 mLs (2.5 mg total) by nebulization every 6 (six) hours as needed for wheezing or shortness of breath. 05/05/15   Steele Sizer, MD  cyclobenzaprine (FLEXERIL) 10 MG tablet Take one tablet every 8 hours as needed 08/03/18   Lloyd Huger, MD  fluticasone [facility]) 50 MCG/ACT nasal spray Place 2 sprays into both nostrils as needed. 06/11/18 09/09/18  Steele Sizer, MD  lidocaine (XYLOCAINE) 2 % solution Use as directed 15 mLs in the mouth or throat as needed for mouth pain. 11/28/17   Jacquelin Hawking, NP  ondansetron (ZOFRAN-ODT) 8 MG disintegrating tablet Take 1 tablet (8 mg total) by mouth every 8 (eight) hours as needed for nausea or vomiting. 07/20/18   Borders, Kirt Boys, NP  SUMAtriptan (IMITREX) 100 MG tablet May repeat in 2 hours if headache persists or recurs. 06/10/18   Steele Sizer, MD  valACYclovir (VALTREX) 1000 MG tablet TAKE 1 TABLET(1000 MG) BY MOUTH THREE TIMES DAILY FOR 7 DAYS Patient not taking: Reported on 08/03/2018 11/24/17   Lloyd Huger, MD  prochlorperazine (COMPAZINE) 10 MG tablet Take 1 tablet (10 mg total) by mouth every 6 (six) hours as needed (Nausea or vomiting). 12/02/16 08/08/17  Lloyd Huger, MD   No Known Allergies  FAMILY HISTORY:  family history includes Breast cancer in her paternal aunt; Dementia in her father; Depression in her mother; Diabetes in her father; Hyperlipidemia  in her brother, brother, and father; Migraines in her mother. SOCIAL HISTORY:  reports that she quit smoking about 18 years ago. Her smoking use included cigarettes. She started smoking about 39 years ago. She has a 30.00 pack-year smoking history. She has never used smokeless tobacco. She reports previous alcohol use. She reports that she does not use drugs.   REVIEW OF SYSTEMS: Positives in  bold Constitutional: Negative for fever, chills, weight loss, malaise/fatigue and diaphoresis.  HENT: Negative for hearing loss, ear pain, nosebleeds, congestion, sore throat, neck pain, tinnitus and ear discharge.   Eyes: Negative for blurred vision, double vision, photophobia, pain, discharge and redness.  Respiratory: Negative for +cough, hemoptysis, sputum production, shortness of breath, +wheezing and stridor.   Cardiovascular: Negative for chest pain, palpitations, orthopnea, claudication, leg swelling and PND.  Gastrointestinal: Negative for heartburn, nausea, vomiting, abdominal pain, diarrhea, constipation, blood in stool and melena.  Genitourinary: Negative for dysuria, urgency, frequency, hematuria and flank pain.  Musculoskeletal: Negative for myalgias, back pain, joint pain and falls.  Skin: Negative for itching and rash.  Neurological: Negative for dizziness, tingling, tremors, sensory change, speech change, focal weakness, seizures, loss of consciousness, weakness and headaches.  Endo/Heme/Allergies: Negative for environmental allergies and polydipsia. Does not bruise/bleed easily.  SUBJECTIVE:  Patient reports dry cough and slight wheezing She denies shortness of breath while on BiPAP, and reports her breathing is much improved since presentation to the ED She denies chest pain, edema, fever/chills  VITAL SIGNS: Temp:  [98.5 F (36.9 C)] 98.5 F (36.9 C) (05/21 2000) Pulse Rate:  [100-148] 116 (05/22 0118) Resp:  [15-34] 20 (05/22 0118) BP: (143-209)/(99-132) 174/103 (05/22 0118) SpO2:  [94 %-100 %] 100 % (05/22 0118) FiO2 (%):  [28 %-36 %] 28 % (05/21 2330) Weight:  [79.4 kg] 79.4 kg (05/21 2008)  PHYSICAL EXAMINATION: General: Acute on chronically ill-appearing female, sitting in bed, on BiPAP and tolerating, no acute distress Neuro: Awake, alert and oriented, follows commands, no focal deficits, speech clear HEENT: Atraumatic, normocephalic, neck supple, no  JVD Cardiovascular: Tachycardia, regular rhythm, S1-S2, no murmurs rubs or gallops, 2+ pulses Lungs: Rales bilaterally to auscultation with slight expiratory expiratory wheeze to upper fields, BiPAP assisted, even, nonlabored Abdomen: Obese, soft, nontender, no guarding or rebound tenderness, bowel sounds positive x4 Musculoskeletal: Generalized weakness, normal bulk and tone, no edema Skin: Warm and dry, no obvious rashes lesions or ulcerations  Recent Labs  Lab 08/03/18 0824 08/05/18 0829 08/06/18 2014  NA 129* 137 138  K 5.2* 5.2* 5.5*  CL 96* 105 107  CO2 24 24 20*  BUN 58* 59* 69*  CREATININE 3.54* 3.20* 3.19*  GLUCOSE 104* 119* 148*   Recent Labs  Lab 08/03/18 0824 08/05/18 0829 08/06/18 2014  HGB 5.9* 8.1* 8.6*  HCT 17.5* 24.1* 26.5*  WBC 5.5 4.6 12.8*  PLT 39* 45* 58*   Dg Chest Portable 1 View  Result Date: 08/06/2018 CLINICAL DATA:  Shortness of breath EXAM: PORTABLE CHEST 1 VIEW COMPARISON:  08/06/2018, 06/29/2018, PET-CT 05/07/2018 FINDINGS: Surgical hardware in the cervical spine. Right-sided central venous port tip over the SVC. Cardiomegaly with vascular congestion. Diffuse interstitial opacity, no significant change as compared with radiograph performed earlier today. No pleural effusion or pneumothorax. IMPRESSION: No significant interval change in cardiomegaly, mild vascular congestion and diffuse interstitial opacity since radiograph performed earlier today. Electronically Signed   By: Donavan Foil M.D.   On: 08/06/2018 23:40   Dg Chest Portable 1 View  Result Date: 08/06/2018 CLINICAL  DATA:  63 year old female with respiratory distress, shortness of breath. Recent weight gain. Hypertensive on presentation. Pending COVID-19 status. History of ovarian cancer. EXAM: PORTABLE CHEST 1 VIEW COMPARISON:  06/29/2018 and earlier. FINDINGS: Portable AP semi upright view at 2001 hours. Stable right chest power port. Stable cardiac size and mediastinal contours. Lower  lung volumes with ongoing diffuse pulmonary interstitial opacity, basilar predominance. This is similar to but increased compared to April. No superimposed pneumothorax, pleural effusion or consolidation. Prior cervical ACDF. No acute osseous abnormality identified. IMPRESSION: Lower lung volumes with ongoing diffuse pulmonary interstitial opacity, similar to but increased compared to April. Differential considerations include viral/atypical respiratory infection, pulmonary edema, less likely lymphangitic carcinomatosis. Electronically Signed   By: Genevie Ann M.D.   On: 08/06/2018 20:28   Ct Renal Stone Study  Result Date: 08/06/2018 CLINICAL DATA:  History of ovarian cancer. Congestive heart failure. Decreasing GFR. Evaluate for renal obstruction. EXAM: CT ABDOMEN AND PELVIS WITHOUT CONTRAST TECHNIQUE: Multidetector CT imaging of the abdomen and pelvis was performed following the standard protocol without IV contrast. COMPARISON:  05/07/2018 PET-CT. 02/02/2018 CT abdomen and pelvis. FINDINGS: Lower chest: Interlobular septal thickening and hazy central ground-glass opacifications of the lung bases. Small bilateral pleural effusions. Mild cardiomegaly. Hepatobiliary: No focal liver abnormality is seen. Status post cholecystectomy. No biliary dilatation. Pancreas: Unremarkable. No pancreatic ductal dilatation or surrounding inflammatory changes. Spleen: Normal in size without focal abnormality. Adrenals/Urinary Tract: Adrenal glands are unremarkable. Kidneys are normal, without renal calculi, focal lesion, or hydronephrosis. Bladder is unremarkable. Stomach/Bowel: Stomach is within normal limits. Appendix appears normal. No evidence of bowel wall thickening, distention, or inflammatory changes. Vascular/Lymphatic: Aortic atherosclerosis. No enlarged abdominal or pelvic lymph nodes. Reproductive: Status post hysterectomy. No adnexal masses. Other: Supraumbilical hernia containing fat is mildly increased in size.  Stable postsurgical changes within the ventral abdominal wall. Nodularity of the left upper quadrant omentum is stable from prior PET-CT (series 2, image 33). Small volume of ascites. Mild edema within the subcutaneous soft tissues. Musculoskeletal: No fracture is seen. L4-5 posterior instrumented fusion and laminectomy chronic postsurgical changes. No apparent hardware related complication. IMPRESSION: 1. No hydronephrosis or urinary stone disease identified. Unremarkable non-contrast CT appearance of the kidneys. 2. Interstitial and alveolar pulmonary edema, small bilateral pleural effusions, small volume of ascites, mild edema within subcutaneous soft tissues. 3. Stable nodularity of the left upper quadrant omentum from prior PET-CT. Electronically Signed   By: Kristine Garbe M.D.   On: 08/06/2018 22:31    ASSESSMENT / PLAN:  Acute Hypoxic Respiratory Failure in setting of Acute Decompensated CHF ?  PE in setting of significantly elevated d-dimer Hx: Asthma -Supplemental O2 as needed to maintain O2 sats greater than 92% -BiPAP, wean as tolerated -Follow intermittent chest x-ray and ABG as needed -Lasix as tolerated -Prn Bronchodilators -Continue Dulera -Unable to obtain CTA Chest at this time due to renal failure, will obtain bilateral lower extremity venous ultrasounds  Acute Decompensated CHF Mildly elevated troponin, likely demand ischemia Hypertension -Cardiac monitoring -Maintain MAP greater than 65 -IV Lasix as tolerated -Trend BNP -Cardiology consulted, appreciate input -Echocardiogram pending -Continue Metoprolol -Prn IV Hydralazine & Labetalol   Leukocytosis, no obvious source of infection -Monitor fever curve -Trend WBCs and procalcitonin -Follow cultures as above -Will hold off on antibiotics for now  AKI on CKD Hyperkalemia Lactic acidosis -Consult nephrology, appreciate input -Monitor I&O's / urinary output -Follow BMP -Ensure adequate renal  perfusion -Avoid nephrotoxic agents as able -Replace electrolytes as indicated -We will hold  off on IV fluids given her volume overload state -No Hyperkalemic EKG changes noted; follow up serum K -Hold home Potassium Gluconate -Trend lactic acid  Anemia without signs and symptoms of bleeding Thrombocytopenia -Monitor for S/Sx of bleeding -Trend CBC -SCD's for VTE Prophylaxis  -Transfuse for Hgb <7 -Transfuse platelets for platelet count < 50 and active bleeding -Oncology consulted, appreciate input  Ovarian Cancer -S/p Chemotherapy -Oncology consulted, appreciate input      Disposition: Stepdown Goals of care: Full code VTE prophylaxis: SCD's Updates: Updated pt at bedside 5/22.  Darel Hong, AGACNP-BC [facility] Pager: [phone] Cell: [phone]  08/07/2018, 1:30 AM

## 2018-08-07 NOTE — Consult Note (Signed)
[place]  [phone] [phone]  ID: Jessica Burgess OB: 02-Jun-1955  MR#: [id number]  VZC#:[id number]  Patient Care Team: Steele Sizer, MD as PCP - General (Family Medicine) Lloyd Huger, MD as Consulting Physician (Oncology) Mellody Drown, MD as Consulting Physician (Obstetrics and Gynecology) Cathi Roan, [facility] (Pharmacist) Benedetto Goad, RN as Case Manager Clent Jacks, RN as Registered Nurse  CHIEF COMPLAINT: Recurrent ovarian cancer, acute renal failure, new onset CHF.  INTERVAL HISTORY: Patient is a 63 year old female actively receiving chemotherapy with gemcitabine for recurrent ovarian cancer.  Her last treatment was approximately 11 days ago.  Earlier this week she was found to have a profound anemia of 5.9 and was given 2 units of packed red blood cells.  She also had acute renal failure and received an additional 2 L of fluids later than the week.  She subsequently presented to the emergency room with worsening shortness of breath and symptoms of congestive heart failure.  She feels significantly improved since from admission, but not quite back to her baseline.  She continues to have chronic weakness and fatigue.  She has no neurologic complaints.  She denies any recent fevers.  She is a good appetite and denies weight loss.  She has no chest pain, cough, or hemoptysis.  She denies any nausea, vomiting, constipation, or diarrhea.  She has no urinary complaints.  Patient otherwise feels well and offers no further specific complaints today.  REVIEW OF SYSTEMS:   Review of Systems  Constitutional: Positive for malaise/fatigue. Negative for fever and weight loss.  Respiratory: Positive for shortness of breath. Negative for cough and hemoptysis.   Cardiovascular: Negative.  Negative for chest pain and leg swelling.  Gastrointestinal: Negative.  Negative for abdominal pain.  Genitourinary: Negative.  Negative for  dysuria.  Musculoskeletal: Negative.  Negative for back pain.  Skin: Positive for rash.  Neurological: Positive for weakness. Negative for dizziness, focal weakness and headaches.  Psychiatric/Behavioral: Negative.  The patient is not nervous/anxious.     As per HPI. Otherwise, a complete review of systems is negative.  PAST MEDICAL HISTORY: Past Medical History:  Diagnosis Date   Allergic rhinitis, cause unspecified    Anxiety state, unspecified    Arthritis    Asthma    only when sick    Backache, unspecified    Bronchitis    hx of when get sick   Cancer [facility])    skin cancer , basal cell    Cancer ([place]) 11/2016   ovarian   Cervicalgia    Complication of anesthesia    Dermatophytosis of nail    Dysmetabolic syndrome X    Encounter for long-term (current) use of other medications    Esophageal reflux    Insomnia, unspecified    Leukocytosis, unspecified    Migraine without aura, without mention of intractable migraine without mention of status migrainosus    Other and unspecified hyperlipidemia    Other malaise and fatigue    Overweight(278.02)    Personal history of chemotherapy now   ovarian   PONV (postoperative nausea and vomiting)    Spinal stenosis in cervical region    Symptomatic menopausal or female climacteric states    Unspecified disorder of skin and subcutaneous tissue    Unspecified vitamin D deficiency     PAST SURGICAL HISTORY: Past Surgical History:  Procedure Laterality Date   ABDOMINAL HYSTERECTOMY     ANTERIOR CERVICAL DECOMP/DISCECTOMY FUSION N/A 06/07/2015   Procedure: Cervical  three - four and Cervical six- seven anterior cervical decompression with fusion interbody prosthesis plating and bonegraft;  Surgeon: Newman Pies, MD;  Location: [facility];  Service: Neurosurgery;  Laterality: N/A;  C34 and C67 anterior cervical decompression with fusion interbody prosthesis plating and bonegraft   BACK SURGERY     x2  Lower    EVACUATION OF CERVICAL HEMATOMA N/A 06/14/2015   Procedure: EVACUATION OF CERVICAL HEMATOMA;  Surgeon: Newman Pies, MD;  Location: [facility];  Service: Neurosurgery;  Laterality: N/A;   NECK SURGERY     x3   TONSILLECTOMY      FAMILY HISTORY: Family History  Problem Relation Age of Onset   Depression Mother    Migraines Mother    Dementia Father    Diabetes Father    Hyperlipidemia Father    Hyperlipidemia Brother    Hyperlipidemia Brother    Breast cancer Paternal Aunt        56s    ADVANCED DIRECTIVES (Y/N):  @ADVDIR @  HEALTH MAINTENANCE: Social History   Tobacco Use   Smoking status: Former Smoker    Packs/day: 1.50    Years: 20.00    Pack years: 30.00    Types: Cigarettes    Start date: 03/19/1979    Last attempt to quit: 08/25/1999    Years since quitting: 18.9   Smokeless tobacco: Never Used   Tobacco comment: smoking cessation materials not required  Substance Use Topics   Alcohol use: Not Currently    Alcohol/week: 0.0 standard drinks   Drug use: No     Colonoscopy:  PAP:  Bone density:  Lipid panel:  No Known Allergies  Current Facility-Administered Medications  Medication Dose Route Frequency Provider Last Rate Last Dose   0.9 %  sodium chloride infusion  250 mL Intravenous PRN Seals, Theo Dills, NP       acetaminophen (TYLENOL) tablet 650 mg  650 mg Oral Q4H PRN Seals, Theo Dills, NP       cyclobenzaprine (FLEXERIL) tablet 10 mg  10 mg Oral TID PRN Bradly Bienenstock, NP       DULoxetine (CYMBALTA) DR capsule 60 mg  60 mg Oral Daily Flora Lipps, MD   60 mg at 08/07/18 1015   ferrous sulfate tablet 325 mg  325 mg Oral Daily Darel Hong D, NP   325 mg at 08/07/18 0957   fluticasone (FLONASE) 50 MCG/ACT nasal spray 2 spray  2 spray Each Nare PRN Bradly Bienenstock, NP       furosemide (LASIX) injection 40 mg  40 mg Intravenous Daily Lateef, Munsoor, MD   40 mg at 08/07/18 0955   gabapentin (NEURONTIN) capsule 300 mg   300 mg Oral TID Darel Hong D, NP   300 mg at 08/07/18 0955   heparin injection 5,000 Units  5,000 Units Subcutaneous Q8H Flora Lipps, MD   5,000 Units at 08/07/18 1015   hydrALAZINE (APRESOLINE) injection 10 mg  10 mg Intravenous Q4H PRN Darel Hong D, NP       ipratropium-albuterol (DUONEB) 0.5-2.5 (3) MG/3ML nebulizer solution 3 mL  3 mL Nebulization Q4H PRN Darel Hong D, NP       labetalol (NORMODYNE) injection 20 mg  20 mg Intravenous Q6H PRN Darel Hong D, NP   20 mg at 08/07/18 0410   levothyroxine (SYNTHROID) tablet 25 mcg  25 mcg Oral QAC breakfast Darel Hong D, NP   25 mcg at 08/07/18 0849   lidocaine (XYLOCAINE) 2 % viscous  mouth solution 15 mL  15 mL Mouth/Throat PRN Bradly Bienenstock, NP       metoprolol succinate (TOPROL-XL) 24 hr tablet 25 mg  25 mg Oral Daily Seals, Theo Dills, NP   25 mg at 08/07/18 0955   mometasone-formoterol (DULERA) 200-5 MCG/ACT inhaler 2 puff  2 puff Inhalation BID Darel Hong D, NP   2 puff at 08/07/18 0849   morphine 2 MG/ML injection 1-2 mg  1-2 mg Intravenous Q4H PRN Bradly Bienenstock, NP   2 mg at 08/07/18 0410   ondansetron (ZOFRAN) injection 4 mg  4 mg Intravenous Q6H PRN Seals, Theo Dills, NP       oxyCODONE-acetaminophen (PERCOCET/ROXICET) 5-325 MG per tablet 1 tablet  1 tablet Oral Q6H PRN Mansy, Jan A, MD       And   oxyCODONE (Oxy IR/ROXICODONE) immediate release tablet 5 mg  5 mg Oral Q6H PRN Mansy, Jan A, MD   5 mg at 08/07/18 1202   sodium chloride flush (NS) 0.9 % injection 3 mL  3 mL Intravenous Q12H Seals, Angela H, NP   3 mL at 08/07/18 1012   sodium chloride flush (NS) 0.9 % injection 3 mL  3 mL Intravenous PRN Seals, Theo Dills, NP       temazepam (RESTORIL) capsule 15 mg  15 mg Oral QHS PRN Bradly Bienenstock, NP       Facility-Administered Medications Ordered in Other Encounters  Medication Dose Route Frequency Provider Last Rate Last Dose   0.9 %  sodium chloride infusion   Intravenous Once  Grayland Ormond, Kathlene November, MD       0.9 %  sodium chloride infusion   Intravenous Once Grayland Ormond, Kathlene November, MD       dexamethasone (DECADRON) 20 mg in sodium chloride 0.9 % 50 mL IVPB  20 mg Intravenous Once Lloyd Huger, MD       dexamethasone (DECADRON) injection 10 mg  10 mg Intravenous Once Lloyd Huger, MD       sodium chloride flush (NS) 0.9 % injection 10 mL  10 mL Intravenous PRN Lloyd Huger, MD   10 mL at 06/16/17 0854    OBJECTIVE: Vitals:   08/07/18 1100 08/07/18 1200  BP: 140/87 (!) 168/94  Pulse: 82 92  Resp: (!) 23 (!) 24  Temp:    SpO2: 95% 95%     Body mass index is 31.57 kg/m.    ECOG FS:2 - Symptomatic, <50% confined to bed  General: Well-developed, well-nourished, no acute distress. Eyes: Pink conjunctiva, anicteric sclera. HEENT: Normocephalic, moist mucous membranes, clear oropharnyx. Lungs: Clear to auscultation bilaterally. Heart: Regular rate and rhythm. No rubs, murmurs, or gallops. Abdomen: Soft, nontender, nondistended. No organomegaly noted, normoactive bowel sounds. Musculoskeletal: No edema, cyanosis, or clubbing. Neuro: Alert, answering all questions appropriately. Cranial nerves grossly intact. Skin: No rashes or petechiae noted. Psych: Normal affect.  LAB RESULTS:  Lab Results  Component Value Date   NA 139 08/07/2018   K 5.5 (H) 08/07/2018   CL 107 08/07/2018   CO2 21 (L) 08/07/2018   GLUCOSE 119 (H) 08/07/2018   BUN 73 (H) 08/07/2018   CREATININE 3.42 (H) 08/07/2018   CALCIUM 8.4 (L) 08/07/2018   PROT 6.5 08/06/2018   ALBUMIN 3.3 (L) 08/06/2018   AST 49 (H) 08/06/2018   ALT 22 08/06/2018   ALKPHOS 66 08/06/2018   BILITOT 1.2 08/06/2018   GFRNONAA 14 (L) 08/07/2018   GFRAA 16 (L) 08/07/2018  Lab Results  Component Value Date   WBC 13.0 (H) 08/07/2018   NEUTROABS 10.6 (H) 08/06/2018   HGB 8.7 (L) 08/07/2018   HCT 27.2 (L) 08/07/2018   MCV 107.5 (H) 08/07/2018   PLT 49 (L) 08/07/2018     STUDIES: US  Venous Img Lower Bilateral  Result Date: 08/07/2018 CLINICAL DATA:  Elevated D-dimer. History of ovarian cancer. Evaluate for DVT. EXAM: BILATERAL LOWER EXTREMITY VENOUS DOPPLER ULTRASOUND TECHNIQUE: Gray-scale sonography with graded compression, as well as color Doppler and duplex ultrasound were performed to evaluate the lower extremity deep venous systems from the level of the common femoral vein and including the common femoral, femoral, profunda femoral, popliteal and calf veins including the posterior tibial, peroneal and gastrocnemius veins when visible. The superficial great saphenous vein was also interrogated. Spectral Doppler was utilized to evaluate flow at rest and with distal augmentation maneuvers in the common femoral, femoral and popliteal veins. COMPARISON:  None. FINDINGS: RIGHT LOWER EXTREMITY Common Femoral Vein: No evidence of thrombus. Normal compressibility, respiratory phasicity and response to augmentation. Saphenofemoral Junction: No evidence of thrombus. Normal compressibility and flow on color Doppler imaging. Profunda Femoral Vein: No evidence of thrombus. Normal compressibility and flow on color Doppler imaging. Femoral Vein: No evidence of thrombus. Normal compressibility, respiratory phasicity and response to augmentation. Popliteal Vein: No evidence of thrombus. Normal compressibility, respiratory phasicity and response to augmentation. Calf Veins: No evidence of thrombus. Normal compressibility and flow on color Doppler imaging. Superficial Great Saphenous Vein: No evidence of thrombus. Normal compressibility. Venous Reflux:  None. Other Findings:  None. LEFT LOWER EXTREMITY Common Femoral Vein: No evidence of thrombus. Normal compressibility, respiratory phasicity and response to augmentation. Saphenofemoral Junction: No evidence of thrombus. Normal compressibility and flow on color Doppler imaging. Profunda Femoral Vein: No evidence of thrombus. Normal compressibility and flow  on color Doppler imaging. Femoral Vein: No evidence of thrombus. Normal compressibility, respiratory phasicity and response to augmentation. Popliteal Vein: No evidence of thrombus. Normal compressibility, respiratory phasicity and response to augmentation. Calf Veins: No evidence of thrombus. Normal compressibility and flow on color Doppler imaging. Superficial Great Saphenous Vein: No evidence of thrombus. Normal compressibility. Venous Reflux:  None. Other Findings: Note made of an approximately 2.3 x 3.6 x 0.8 cm serpiginous fluid collection with the left popliteal fossa. IMPRESSION: 1. No evidence of DVT within either lower extremity. 2. Incidentally noted approximately 3.6 cm left-sided Baker's cyst. Electronically Signed   By: Sandi Mariscal M.D.   On: 08/07/2018 07:49   Dg Chest Port 1 View  Result Date: 08/07/2018 CLINICAL DATA:  Acute CHF EXAM: PORTABLE CHEST 1 VIEW COMPARISON:  Yesterday FINDINGS: Cardiomegaly and vascular pedicle widening. Continued perihilar predominant interstitial airspace opacity. No evidence of pleural effusion or pneumothorax. Port on the right with tip at the SVC. IMPRESSION: Continued CHF pattern. Electronically Signed   By: Monte Fantasia M.D.   On: 08/07/2018 06:15   Dg Chest Portable 1 View  Result Date: 08/06/2018 CLINICAL DATA:  Shortness of breath EXAM: PORTABLE CHEST 1 VIEW COMPARISON:  08/06/2018, 06/29/2018, PET-CT 05/07/2018 FINDINGS: Surgical hardware in the cervical spine. Right-sided central venous port tip over the SVC. Cardiomegaly with vascular congestion. Diffuse interstitial opacity, no significant change as compared with radiograph performed earlier today. No pleural effusion or pneumothorax. IMPRESSION: No significant interval change in cardiomegaly, mild vascular congestion and diffuse interstitial opacity since radiograph performed earlier today. Electronically Signed   By: Donavan Foil M.D.   On: 08/06/2018 23:40   Dg  Chest Portable 1  View  Result Date: 08/06/2018 CLINICAL DATA:  63 year old female with respiratory distress, shortness of breath. Recent weight gain. Hypertensive on presentation. Pending COVID-19 status. History of ovarian cancer. EXAM: PORTABLE CHEST 1 VIEW COMPARISON:  06/29/2018 and earlier. FINDINGS: Portable AP semi upright view at 2001 hours. Stable right chest power port. Stable cardiac size and mediastinal contours. Lower lung volumes with ongoing diffuse pulmonary interstitial opacity, basilar predominance. This is similar to but increased compared to April. No superimposed pneumothorax, pleural effusion or consolidation. Prior cervical ACDF. No acute osseous abnormality identified. IMPRESSION: Lower lung volumes with ongoing diffuse pulmonary interstitial opacity, similar to but increased compared to April. Differential considerations include viral/atypical respiratory infection, pulmonary edema, less likely lymphangitic carcinomatosis. Electronically Signed   By: Genevie Ann M.D.   On: 08/06/2018 20:28   Ct Renal Stone Study  Result Date: 08/06/2018 CLINICAL DATA:  History of ovarian cancer. Congestive heart failure. Decreasing GFR. Evaluate for renal obstruction. EXAM: CT ABDOMEN AND PELVIS WITHOUT CONTRAST TECHNIQUE: Multidetector CT imaging of the abdomen and pelvis was performed following the standard protocol without IV contrast. COMPARISON:  05/07/2018 PET-CT. 02/02/2018 CT abdomen and pelvis. FINDINGS: Lower chest: Interlobular septal thickening and hazy central ground-glass opacifications of the lung bases. Small bilateral pleural effusions. Mild cardiomegaly. Hepatobiliary: No focal liver abnormality is seen. Status post cholecystectomy. No biliary dilatation. Pancreas: Unremarkable. No pancreatic ductal dilatation or surrounding inflammatory changes. Spleen: Normal in size without focal abnormality. Adrenals/Urinary Tract: Adrenal glands are unremarkable. Kidneys are normal, without renal calculi, focal  lesion, or hydronephrosis. Bladder is unremarkable. Stomach/Bowel: Stomach is within normal limits. Appendix appears normal. No evidence of bowel wall thickening, distention, or inflammatory changes. Vascular/Lymphatic: Aortic atherosclerosis. No enlarged abdominal or pelvic lymph nodes. Reproductive: Status post hysterectomy. No adnexal masses. Other: Supraumbilical hernia containing fat is mildly increased in size. Stable postsurgical changes within the ventral abdominal wall. Nodularity of the left upper quadrant omentum is stable from prior PET-CT (series 2, image 33). Small volume of ascites. Mild edema within the subcutaneous soft tissues. Musculoskeletal: No fracture is seen. L4-5 posterior instrumented fusion and laminectomy chronic postsurgical changes. No apparent hardware related complication. IMPRESSION: 1. No hydronephrosis or urinary stone disease identified. Unremarkable non-contrast CT appearance of the kidneys. 2. Interstitial and alveolar pulmonary edema, small bilateral pleural effusions, small volume of ascites, mild edema within subcutaneous soft tissues. 3. Stable nodularity of the left upper quadrant omentum from prior PET-CT. Electronically Signed   By: Kristine Garbe M.D.   On: 08/06/2018 22:31    ASSESSMENT: Recurrent ovarian cancer, acute renal failure, new onset CHF  PLAN:    1. Stage IIIc high-grade serous ovarian carcinoma: PET scan results from May 07, 2018 reviewed independently with significant improvement in disease burden.  Patient CA-125 appears to be erratic ranging from 40s to 60s, although her most recent result is trending up to 75.4.  Patient has inquired about intraperitoneal chemotherapy, but the benefit is unclear with increased toxicity.   Patient last received gemcitabine approximately 11 days ago.  This does not need to be dose reduced in the setting of acute renal failure.  She has been instructed to keep her previously scheduled follow-up  appointment next week for further evaluation. 2.  Anemia: Patient's hemoglobin significantly improved to 8.7 with 2 units packed red blood cells earlier this week. 3.  Thrombocytopenia: Decreased, but stable at 49.  Likely secondary to chemotherapy. 4.  Acute renal failure: Initially thought to be secondary to intravascular dehydration  and profound anemia, but her creatinine has not improved despite blood transfusion and IV fluids.  Appreciate nephrology input.  Continue to monitor. 5.  CHF: Patient does not have a history of any heart disease.  Appreciate cardiology input. 6.  Shortness of breath: Likely multifactorial, but appears more related to fluid overload and acute CHF. 7.  Disposition: Dr. Mike Gip will be covering the oncology service line over the weekend please call her with any questions.  If not, will follow-up on Tuesday if patient remains inpatient.  Appreciate consult, will follow.   Lloyd Huger, MD   08/07/2018 1:38 PM

## 2018-08-07 NOTE — Telephone Encounter (Signed)
Patient in ICU - 2  Consult to oncology Consult Timeframe: ROUTINE - requires response within 24 hours; Reason for Consult? high grade ovarian cancer- admitted with acute CHF- sees Dr Grayland Ormond (Order [id number])  Consult  Date: 08/07/2018 Department: [facility] ICU/CCU Released By/Authorizing: Mayer Camel, NP (auto-released)   Mayer Camel, NP NPI: [id number]    Patient Information   Patient Name Jessica Burgess, Jessica Burgess Sex Female DOB Mar 30, 1955 SSN [id number]  Order Details   Frequency Duration Priority Order Class  Once 1 occurrence Routine Hospital Performed  Order Questions   Question Answer Comment  Consult Timeframe ROUTINE - requires response within 24 hours   Reason for Consult? high grade ovarian cancer- admitted with acute CHF- sees Dr Grayland Ormond

## 2018-08-07 NOTE — Consult Note (Signed)
Cardiology Consultation:   Patient ID: CATTLEYA DOBRATZ; [id number]; 01-13-1956   Admit date: 08/06/2018 Date of Consult: 08/07/2018  Primary Care Provider: Steele Sizer, MD Primary Cardiologist: new to [facility] - consult by [place]   Patient Profile:   Jessica Burgess is a 63 y.o. female with a hx of ovarian cancer on gemcitabine with associated anemia and thrombocytopenia, CKD stage III, HTN, asthma, and migraine disorder who is being seen today for the evaluation of acute CHF at the request of Ms. Seals, NP.  History of Present Illness:   Ms. Harp has not previously known cardiac history. In follow up with cancer center on 08/03/2018 with increased SOB, weakness, fatigue, edema and decreased PO appetite. She was noted to have acute on CKD with a BUN/SCr of 58/3.54 with a baseline SCR around 1.7, potassium 5.2, sodium 129. CBC showed a worsening HGB of 5.9 with a baseline around 7-8 and a platelet count of 39. Given her profound lab findings, her planned infusion cycle was deferred at that time and she was transfused 2 units of pRBC along with 2 L of IV fluids. Repeat labs on 08/05/2018 showed slightly improved renal function with a BUN of 59 and SCr of 3.20. Potassium was stable at 5.2, sodium 137, albumin 2.9, HGB 8.1. She underwent her previously scheduled chemo infusion. Following, this continued to note worsening SOB, orthopnea, cough and a 5 pound weight gain overnight prompting her to come to the ED on 5/21. Upon her arrival, she was noted to be severely hypertensive with SBP 170->200 mmHg. Her initial CXR showed diffuse ongoing pulmonary opacity. CT renal stone showed no hydronephrosis or stone with interstitial and alveolar pulmonary edema, small bilateral pleural effusions, and mild subcutaneous edema. Lower extremity ultrasound was negative for DVT bilaterally. Repeat CXR this morning showed CHF. Labs initially showed stable SCr of 3.19 with a wore BUN of 69. She was given IV  Lasix 20 mg x 1 and 40 mg x 2 upon arrival and overnight with a BUN/SCr this morning of 73 and 3.42, potassium 5.5, BNP 2727, troponin 0.06-->1.10-->0.81. D dimer [phone]. She did not undergo CTA of the chest secondary to AKI. WBC 12.8-->13.0, HGB 8.6-->8.7, PLT 58-->49, lactic acid 3.4-->3.1, COVID-19 negative. She was initially placed on BiPAP, though has been transitioned to nasal cannula. Currently, she notes improvement in her SOB. Documented UOP of < 1 L overnight. Documented weight trend of 79.4-->78.3 kg.   Past Medical History:  Diagnosis Date   Allergic rhinitis, cause unspecified    Anxiety state, unspecified    Arthritis    Asthma    only when sick    Backache, unspecified    Bronchitis    hx of when get sick   Cancer [facility])    skin cancer , basal cell    Cancer ([place]) 11/2016   ovarian   Cervicalgia    Complication of anesthesia    Dermatophytosis of nail    Dysmetabolic syndrome X    Encounter for long-term (current) use of other medications    Esophageal reflux    Insomnia, unspecified    Leukocytosis, unspecified    Migraine without aura, without mention of intractable migraine without mention of status migrainosus    Other and unspecified hyperlipidemia    Other malaise and fatigue    Overweight(278.02)    Personal history of chemotherapy now   ovarian   PONV (postoperative nausea and vomiting)    Spinal stenosis in cervical region  Symptomatic menopausal or female climacteric states    Unspecified disorder of skin and subcutaneous tissue    Unspecified vitamin D deficiency     Past Surgical History:  Procedure Laterality Date   ABDOMINAL HYSTERECTOMY     ANTERIOR CERVICAL DECOMP/DISCECTOMY FUSION N/A 06/07/2015   Procedure: Cervical three - four and Cervical six- seven anterior cervical decompression with fusion interbody prosthesis plating and bonegraft;  Surgeon: Newman Pies, MD;  Location: [place] [facility];  Service: Neurosurgery;   Laterality: N/A;  C34 and C67 anterior cervical decompression with fusion interbody prosthesis plating and bonegraft   BACK SURGERY     x2 Lower    EVACUATION OF CERVICAL HEMATOMA N/A 06/14/2015   Procedure: EVACUATION OF CERVICAL HEMATOMA;  Surgeon: Newman Pies, MD;  Location: [place] [facility];  Service: Neurosurgery;  Laterality: N/A;   NECK SURGERY     x3   TONSILLECTOMY       Home Meds: Prior to Admission medications   Medication Sig Start Date End Date Taking? Authorizing Provider  Ascorbic Acid (VITAMIN C PO) Take 1 tablet by mouth daily.    Yes [provider]  budesonide-formoterol (SYMBICORT) 160-4.5 MCG/ACT inhaler Inhale 2 puffs into the lungs 2 (two) times daily.   Yes [provider]  Cholecalciferol (VITAMIN D-1000 MAX ST) 1000 UNITS tablet Take 1 tablet by mouth 2 (two) times daily.   Yes [provider]  Cyanocobalamin (B-12 PO) Take 1 tablet by mouth daily.    Yes [provider]  DULoxetine (CYMBALTA) 60 MG capsule TAKE 1 CAPSULE(60 MG) BY MOUTH DAILY 06/11/18  Yes Sowles, Drue Stager, MD  estradiol (ESTRACE) 0.5 MG tablet Take 1 tablet (0.5 mg total) by mouth daily. 10/28/17  Yes Sowles, Drue Stager, MD  ferrous sulfate (IRON SUPPLEMENT) 325 (65 FE) MG tablet Take 1 tablet by mouth daily.   Yes [provider]  gabapentin (NEURONTIN) 300 MG capsule Take 1-3 capsules (300-900 mg total) by mouth 3 (three) times daily. 300 mg twice a day and 900 mg at night 06/11/18  Yes Sowles, Drue Stager, MD  ipratropium (ATROVENT HFA) 17 MCG/ACT inhaler Inhale 2 puffs into the lungs every 6 (six) hours as needed.    Yes [provider]  levothyroxine (SYNTHROID) 25 MCG tablet Take 1 tablet (25 mcg total) by mouth daily before breakfast. 07/21/18  Yes Borders, Kirt Boys, NP  Lysine 1000 MG TABS Take 1 tablet by mouth daily.    Yes [provider]  Magnesium Oxide 500 MG CAPS Take 500 mg by mouth 2 (two) times daily.    Yes [provider]  Multiple Vitamins-Minerals (MULTIVITAMIN PO) Take 1 tablet by mouth daily.    Yes [provider]  Omega-3 Fatty Acids (FISH OIL PO) Take 1 capsule by mouth 2 (two) times daily.    Yes [provider]  oxyCODONE-acetaminophen (PERCOCET) 10-325 MG tablet Take 1 tablet by mouth every 6 (six) hours as needed for pain. 07/20/18  Yes Borders, Kirt Boys, NP  potassium gluconate 595 MG TABS tablet Take 595 mg by mouth daily.   Yes [provider]  temazepam (RESTORIL) 15 MG capsule TAKE 1 CAPSULE(15 MG) BY MOUTH AT BEDTIME AS NEEDED FOR SLEEP 07/13/18  Yes Sowles, Drue Stager, MD  albuterol (PROVENTIL HFA;VENTOLIN HFA) 108 (90 Base) MCG/ACT inhaler Inhale 2 puffs into the lungs every 6 (six) hours as needed for wheezing or shortness of breath. 10/04/16   Hubbard Hartshorn, FNP  albuterol (PROVENTIL) (2.5 MG/3ML) 0.083% nebulizer solution Take  3 mLs (2.5 mg total) by nebulization every 6 (six) hours as needed for wheezing or shortness of breath. 05/05/15   Steele Sizer, MD  cyclobenzaprine (FLEXERIL) 10 MG tablet Take one tablet every 8 hours as needed 08/03/18   Lloyd Huger, MD  fluticasone [facility]) 50 MCG/ACT nasal spray Place 2 sprays into both nostrils as needed. 06/11/18 09/09/18  Steele Sizer, MD  lidocaine (XYLOCAINE) 2 % solution Use as directed 15 mLs in the mouth or throat as needed for mouth pain. 11/28/17   Jacquelin Hawking, NP  ondansetron (ZOFRAN-ODT) 8 MG disintegrating tablet Take 1 tablet (8 mg total) by mouth every 8 (eight) hours as needed for nausea or vomiting. 07/20/18   Borders, Kirt Boys, NP  SUMAtriptan (IMITREX) 100 MG tablet May repeat in 2 hours if headache persists or recurs. 06/10/18   Steele Sizer, MD  valACYclovir (VALTREX) 1000 MG tablet TAKE 1 TABLET(1000 MG) BY MOUTH THREE TIMES DAILY FOR 7 DAYS Patient not taking: Reported on 08/03/2018 11/24/17   Lloyd Huger, MD  prochlorperazine (COMPAZINE) 10 MG tablet Take 1 tablet (10 mg  total) by mouth every 6 (six) hours as needed (Nausea or vomiting). 12/02/16 08/08/17  Lloyd Huger, MD    Inpatient Medications: Scheduled Meds:  ferrous sulfate  325 mg Oral Daily   furosemide  40 mg Intravenous Q12H   gabapentin  300 mg Oral TID   levothyroxine  25 mcg Oral QAC breakfast   metoprolol succinate  25 mg Oral Daily   mometasone-formoterol  2 puff Inhalation BID   sodium chloride flush  3 mL Intravenous Q12H   Continuous Infusions:  sodium chloride     PRN Meds: sodium chloride, acetaminophen, cyclobenzaprine, fluticasone, hydrALAZINE, ipratropium-albuterol, labetalol, lidocaine, morphine injection, ondansetron (ZOFRAN) IV, oxyCODONE-acetaminophen **AND** oxyCODONE, sodium chloride flush, temazepam  Allergies:  No Known Allergies  Social History:   Social History   Socioeconomic History   Marital status: Single    Spouse name: Not on file   Number of children: 0   Years of education: some college   Highest education level: 12th grade  Occupational History    Employer: DISABLED   Occupation: Disabled   Scientist, product/process development strain: Very hard   Food insecurity:    Worry: Never true    Inability: Never true   Transportation needs:    Medical: No    Non-medical: No  Tobacco Use   Smoking status: Former Smoker    Packs/day: 1.50    Years: 20.00    Pack years: 30.00    Types: Cigarettes    Start date: 03/19/1979    Last attempt to quit: 08/25/1999    Years since quitting: 18.9   Smokeless tobacco: Never Used   Tobacco comment: smoking cessation materials not required  Substance and Sexual Activity   Alcohol use: Not Currently    Alcohol/week: 0.0 standard drinks   Drug use: No   Sexual activity: Never  Lifestyle   Physical activity:    Days per week: 0 days    Minutes per session: 0 min   Stress: Very much  Relationships   Social connections:    Talks on phone: Patient refused    Gets together: Patient  refused    Attends religious service: Patient refused    Active member of club or organization: Patient refused    Attends meetings of clubs or organizations: Patient refused    Relationship status: Patient refused   Intimate partner violence:  Fear of current or ex partner: No    Emotionally abused: No    Physically abused: No    Forced sexual activity: No  Other Topics Concern   Not on file  Social History Narrative   Patient is single.    Patient lives with roommates.    Patient on disability    Patient has no children.    Patient has some college      Family History:   Family History  Problem Relation Age of Onset   Depression Mother    Migraines Mother    Dementia Father    Diabetes Father    Hyperlipidemia Father    Hyperlipidemia Brother    Hyperlipidemia Brother    Breast cancer Paternal Aunt        71s    ROS:  Review of Systems  Constitutional: Positive for malaise/fatigue. Negative for chills, diaphoresis, fever and weight loss.       Weight gain of 5 pounds overnight   HENT: Negative for congestion.   Eyes: Negative for discharge and redness.  Respiratory: Positive for cough and shortness of breath. Negative for hemoptysis, sputum production and wheezing.   Cardiovascular: Positive for orthopnea. Negative for chest pain, palpitations, claudication, leg swelling and PND.  Gastrointestinal: Negative for abdominal pain, blood in stool, heartburn, melena, nausea and vomiting.       Abdominal swelling   Genitourinary: Negative for hematuria.  Musculoskeletal: Negative for falls and myalgias.  Skin: Negative for rash.  Neurological: Positive for weakness. Negative for dizziness, tingling, tremors, sensory change, speech change, focal weakness and loss of consciousness.  Endo/Heme/Allergies: Does not bruise/bleed easily.  Psychiatric/Behavioral: Negative for substance abuse. The patient is not nervous/anxious.   All other systems reviewed and are  negative.     Physical Exam/Data:   Vitals:   08/07/18 0600 08/07/18 0700 08/07/18 0800 08/07/18 0826  BP: 133/84 124/77 136/80   Pulse: 81 88 90   Resp: (!) 22 (!) 23 (!) 21   Temp:   98.8 F (37.1 C)   TempSrc:   Axillary   SpO2: 100% 100% 100% 100%  Weight:      Height:        Intake/Output Summary (Last 24 hours) at 08/07/2018 0936 Last data filed at 08/07/2018 0318 Gross per 24 hour  Intake --  Output 875 ml  Net -875 ml   Filed Weights   08/06/18 2008 08/07/18 0240  Weight: 79.4 kg 78.3 kg   Body mass index is 31.57 kg/m.   Physical Exam: General: Well developed, well nourished, in no acute distress. Head: Normocephalic, atraumatic, sclera non-icteric, no xanthomas, nares without discharge.  Neck: Negative for carotid bruits. JVD not elevated. Lungs: Diminished breath sounds bilaterally with faint crackles along the bases. Breathing is unlabored. Heart: RRR with S1 S2. II/VI systolic murmur at the apex, no rubs, or gallops appreciated. Abdomen: Soft, non-tender, non-distended with normoactive bowel sounds. No hepatomegaly. No rebound/guarding. No obvious abdominal masses. Msk:  Strength and tone appear normal for age. Extremities: No clubbing or cyanosis. No edema. Distal pedal pulses are 2+ and equal bilaterally. Neuro: Alert and oriented X 3. No facial asymmetry. No focal deficit. Moves all extremities spontaneously. Psych:  Responds to questions appropriately with a normal affect.   EKG:  The EKG was personally reviewed and demonstrates: 5/21 - sinus tachycardia, 106 bpm, baseline wandering, no acute st/t changes. 5/22 - NSR, 80 bpm, poor R wave progression, no acute st/t changes Telemetry:  Telemetry was  personally reviewed and demonstrates: SR  Weights: [facility] Weights   08/06/18 2008 08/07/18 0240  Weight: 79.4 kg 78.3 kg    Relevant CV Studies: 2D Echo pending  Laboratory Data:  Chemistry Recent Labs  Lab 08/05/18 0829 08/06/18 2014  08/07/18 0340  NA 137 138 139  K 5.2* 5.5* 5.5*  CL 105 107 107  CO2 24 20* 21*  GLUCOSE 119* 148* 119*  BUN 59* 69* 73*  CREATININE 3.20* 3.19* 3.42*  CALCIUM 8.0* 8.3* 8.4*  GFRNONAA 15* 15* 14*  GFRAA 17* 17* 16*  ANIONGAP 8 11 11     Recent Labs  Lab 08/03/18 0824 08/05/18 0829 08/06/18 2014  PROT 5.7* 5.7* 6.5  ALBUMIN 3.1* 2.9* 3.3*  AST 38 32 49*  ALT 26 21 22   ALKPHOS 58 61 66  BILITOT 0.8 0.9 1.2   Hematology Recent Labs  Lab 08/05/18 0829 08/06/18 2014 08/07/18 0340  WBC 4.6 12.8* 13.0*  RBC 2.34* 2.51* 2.53*  HGB 8.1* 8.6* 8.7*  HCT 24.1* 26.5* 27.2*  MCV 103.0* 105.6* 107.5*  MCH 34.6* 34.3* 34.4*  MCHC 33.6 32.5 32.0  RDW 23.5* 23.8* 24.0*  PLT 45* 58* 49*   Cardiac Enzymes Recent Labs  Lab 08/06/18 2014 08/07/18 0340 08/07/18 0832  TROPONINI 0.06* 1.10* 0.81*   No results for input(s): TROPIPOC in the last 168 hours.  BNP Recent Labs  Lab 08/06/18 2015 08/07/18 0340  BNP 1,407.0* 2,727.0*    DDimer No results for input(s): DDIMER in the last 168 hours.  Radiology/Studies:  US Venous Img Lower Bilateral  Result Date: 08/07/2018 IMPRESSION: 1. No evidence of DVT within either lower extremity. 2. Incidentally noted approximately 3.6 cm left-sided Baker's cyst. Electronically Signed   By: Sandi Mariscal M.D.   On: 08/07/2018 07:49   Dg Chest Port 1 View  Result Date: 08/07/2018 IMPRESSION: Continued CHF pattern. Electronically Signed   By: Monte Fantasia M.D.   On: 08/07/2018 06:15   Dg Chest Portable 1 View  Result Date: 08/06/2018 IMPRESSION: No significant interval change in cardiomegaly, mild vascular congestion and diffuse interstitial opacity since radiograph performed earlier today. Electronically Signed   By: Donavan Foil M.D.   On: 08/06/2018 23:40   Dg Chest Portable 1 View  Result Date: 08/06/2018 IMPRESSION: Lower lung volumes with ongoing diffuse pulmonary interstitial opacity, similar to but increased compared to April.  Differential considerations include viral/atypical respiratory infection, pulmonary edema, less likely lymphangitic carcinomatosis. Electronically Signed   By: Genevie Ann M.D.   On: 08/06/2018 20:28   Ct Renal Stone Study  Result Date: 08/06/2018 IMPRESSION: 1. No hydronephrosis or urinary stone disease identified. Unremarkable non-contrast CT appearance of the kidneys. 2. Interstitial and alveolar pulmonary edema, small bilateral pleural effusions, small volume of ascites, mild edema within subcutaneous soft tissues. 3. Stable nodularity of the left upper quadrant omentum from prior PET-CT. Electronically Signed   By: Kristine Garbe M.D.   On: 08/06/2018 22:31    Assessment and Plan:   1. Acute hypoxic respiratory failure: -Likely multifactorial including volume overload with acute CHF, possible cardiorenal syndrome, anemia, asthma, possible PE -She has been unable to undergo CTA chest given AKI -Consider VQ scan -Echo will be helpful to assess RV -Weaned off BiPAP, now on nasal cannula  2. Acute CHF: -Type uncertain -Labs concerning for cardiorenal syndrome, though echo tech has stated EF appeared to be low-normal -No TR jet noted on echo, unable to assess right-side pressure -With worsening renal function will decrease IV  Lasix to once daily after discussing with nephrology  -Await echo -Toprol XL -Daily weights, strict I/O  3. Acute on CKD stage III: -Concerning for cardiorenal syndrome as above -Nephrology consulted -Avoid nephrotoxic agents  4. Anemia/thrombocytopenia: -In the setting of chemotherapy  -Transfuse as needed -Per PCCM/IM  5. Leukocytosis: -Per PCCM/IM  6. Hyperkalemia: -In the setting of #3 -Per PCCM/nephrology    For questions or updates, please contact [place] Please consult [email or web address] for contact info under Cardiology/STEMI.   Signed, Christell Faith, PA-C [place] Pager: [phone] 08/07/2018, 9:36 AM

## 2018-08-07 NOTE — Telephone Encounter (Signed)
Copied from [place] [phone]. Topic: Quick Communication - See Telephone Encounter >> Aug 07, 2018  2:39 PM Rutherford Nail, [place] wrote: CRM for notification. See Telephone encounter for: 08/07/18. Astrid Drafts, patient's emergency contact and medical POA, calling and wanted to update Dr Ancil Boozer on her situation. States that the patient was taken via ambulance to [facility] and is now admitted into the ICU with issues breathing. Wanted Dr Ancil Boozer to be aware of the situation. Any questions or concerns, feel free to give a call back.  CB#: [phone]

## 2018-08-07 NOTE — ED Notes (Signed)
ED TO INPATIENT HANDOFF REPORT  ED Nurse Name and Phone #: Antonieta Pert, RN [phone]  S Name/Age/Gender Jessica Burgess 63 y.o. female Room/Bed: ED01A/ED01A  Code Status   Code Status: Full Code  Home/SNF/Other Home Patient oriented to: self, place, time and situation Is this baseline? Yes   Triage Complete: Triage complete  Chief Complaint shortness of breath  Triage Note Pt presents via EMS in respiratory distress starting this morning @ home. Pt states worsening SHOB x 2 weeks. Pt recently transfused and had weight gain of 5 lbs over the past 3 days. Pt is tachypneic, tachycardiac, increased work of breathing, and hypertensive upon arrival to ED.   Allergies No Known Allergies  Level of Care/Admitting Diagnosis ED Disposition    ED Disposition Condition [facility] Area: [place] [[id number]]  Level of Care: Med-Surg [16]  Covid Evaluation: N/A  Diagnosis: Acute CHF [facility]) [[id number]]  Admitting Physician: Christel Mormon [[id number]]  Attending Physician: Christel Mormon [[id number]]  Estimated length of stay: past midnight tomorrow  Certification:: I certify this patient will need inpatient services for at least 2 midnights  PT Class (Do Not Modify): Inpatient [101]  PT Acc Code (Do Not Modify): Private [1]       B Medical/Surgery History Past Medical History:  Diagnosis Date  . Allergic rhinitis, cause unspecified   . Anxiety state, unspecified   . Arthritis   . Asthma    only when sick   . Backache, unspecified   . Bronchitis    hx of when get sick  . Cancer ([place])    skin cancer , basal cell   . Cancer ([place]) 11/2016   ovarian  . Cervicalgia   . Complication of anesthesia   . Dermatophytosis of nail   . Dysmetabolic syndrome X   . Encounter for long-term (current) use of other medications   . Esophageal reflux   . Insomnia, unspecified   . Leukocytosis, unspecified   . Migraine without aura, without mention of intractable  migraine without mention of status migrainosus   . Other and unspecified hyperlipidemia   . Other malaise and fatigue   . Overweight(278.02)   . Personal history of chemotherapy now   ovarian  . PONV (postoperative nausea and vomiting)   . Spinal stenosis in cervical region   . Symptomatic menopausal or female climacteric states   . Unspecified disorder of skin and subcutaneous tissue   . Unspecified vitamin D deficiency    Past Surgical History:  Procedure Laterality Date  . ABDOMINAL HYSTERECTOMY    . ANTERIOR CERVICAL DECOMP/DISCECTOMY FUSION N/A 06/07/2015   Procedure: Cervical three - four and Cervical six- seven anterior cervical decompression with fusion interbody prosthesis plating and bonegraft;  Surgeon: Newman Pies, MD;  Location: [place] NEURO ORS;  Service: Neurosurgery;  Laterality: N/A;  C34 and C67 anterior cervical decompression with fusion interbody prosthesis plating and bonegraft  . BACK SURGERY     x2 Lower   . EVACUATION OF CERVICAL HEMATOMA N/A 06/14/2015   Procedure: EVACUATION OF CERVICAL HEMATOMA;  Surgeon: Newman Pies, MD;  Location: [place] NEURO ORS;  Service: Neurosurgery;  Laterality: N/A;  . NECK SURGERY     x3  . TONSILLECTOMY       A IV Location/Drains/Wounds Patient Lines/Drains/Airways Status   Active Line/Drains/Airways    Name:   Placement date:   Placement time:   Site:   Days:   Implanted Port 12/09/16 Right Chest  12/09/16    1124    Chest   606   Peripheral IV 08/06/18 Right Antecubital   08/06/18    1957    Antecubital   1   Closed System Drain 1 Anterior Neck Bulb (JP)   06/14/15    1827    Neck   1150   External Urinary Catheter   08/06/18    2035    -   1   Incision (Closed) 06/07/15 Neck   06/07/15    1632     1157   Incision (Closed) 06/14/15 Neck   06/14/15    1827     1150          Intake/Output Last 24 hours No intake or output data in the 24 hours ending 08/07/18 0119  Labs/Imaging Results for orders placed or performed  during the hospital encounter of 08/06/18 (from the past 48 hour(s))  Comprehensive metabolic panel     Status: Abnormal   Collection Time: 08/06/18  8:14 PM  Result Value Ref Range   Sodium 138 135 - 145 mmol/L   Potassium 5.5 (H) 3.5 - 5.1 mmol/L   Chloride 107 98 - 111 mmol/L   CO2 20 (L) 22 - 32 mmol/L   Glucose, Bld 148 (H) 70 - 99 mg/dL   BUN 69 (H) 8 - 23 mg/dL   Creatinine, Ser 3.19 (H) 0.44 - 1.00 mg/dL   Calcium 8.3 (L) 8.9 - 10.3 mg/dL   Total Protein 6.5 6.5 - 8.1 g/dL   Albumin 3.3 (L) 3.5 - 5.0 g/dL   AST 49 (H) 15 - 41 U/L   ALT 22 0 - 44 U/L   Alkaline Phosphatase 66 38 - 126 U/L   Total Bilirubin 1.2 0.3 - 1.2 mg/dL   GFR calc non Af Amer 15 (L) >60 mL/min   GFR calc Af Amer 17 (L) >60 mL/min   Anion gap 11 5 - 15    Comment: Performed at [facility], [address] [place], [place] [address]  CBC with Differential     Status: Abnormal   Collection Time: 08/06/18  8:14 PM  Result Value Ref Range   WBC 12.8 (H) 4.0 - 10.5 K/uL   RBC 2.51 (L) 3.87 - 5.11 MIL/uL   Hemoglobin 8.6 (L) 12.0 - 15.0 g/dL   HCT 26.5 (L) 36.0 - 46.0 %   MCV 105.6 (H) 80.0 - 100.0 fL   MCH 34.3 (H) 26.0 - 34.0 pg   MCHC 32.5 30.0 - 36.0 g/dL   RDW 23.8 (H) 11.5 - 15.5 %   Platelets 58 (L) 150 - 400 K/uL    Comment: Immature Platelet Fraction may be clinically indicated, consider ordering this additional test [id number]    nRBC 0.0 0.0 - 0.2 %   Neutrophils Relative % 83 %   Neutro Abs 10.6 (H) 1.7 - 7.7 K/uL   Lymphocytes Relative 5 %   Lymphs Abs 0.7 0.7 - 4.0 K/uL   Monocytes Relative 11 %   Monocytes Absolute 1.3 (H) 0.1 - 1.0 K/uL   Eosinophils Relative 0 %   Eosinophils Absolute 0.0 0.0 - 0.5 K/uL   Basophils Relative 0 %   Basophils Absolute 0.0 0.0 - 0.1 K/uL   WBC Morphology MORPHOLOGY UNREMARKABLE    Immature Granulocytes 1 %   Abs Immature Granulocytes 0.13 (H) 0.00 - 0.07 K/uL   Schistocytes PRESENT    Dimorphism PRESENT     Comment: Performed at  [facility], [place], [place] [address]  Troponin I - Once     Status: Abnormal   Collection Time: 08/06/18  8:14 PM  Result Value Ref Range   Troponin I 0.06 (HH) <0.03 ng/mL    Comment: CRITICAL RESULT CALLED TO, READ BACK BY AND VERIFIED WITH Manpreet Strey HADEN @2101  08/06/18 MJU Performed at [facility], [address] [place], [place] [address]   Fibrin derivatives D-Dimer     Status: Abnormal   Collection Time: 08/06/18  8:14 PM  Result Value Ref Range   Fibrin derivatives D-dimer ([facility]) 3,693.72 (H) 0.00 - 499.00 ng/mL (FEU)    Comment: (NOTE) <> Exclusion of Venous Thromboembolism (VTE) - OUTPATIENT ONLY   (Emergency Department or [place])   0-499 ng/ml (FEU): With a low to intermediate pretest probability                      for VTE this test result excludes the diagnosis                      of VTE.   >499 ng/ml (FEU) : VTE not excluded; additional work up for VTE is                      required. <> Testing on Inpatients and Evaluation of Disseminated Intravascular   Coagulation (DIC) Reference Range:   0-499 ng/ml (FEU) Performed at [facility], [address] [place], [place] [address]   Brain natriuretic peptide     Status: Abnormal   Collection Time: 08/06/18  8:15 PM  Result Value Ref Range   B Natriuretic Peptide 1,407.0 (H) 0.0 - 100.0 pg/mL    Comment: Performed at [facility], [address] [place], [place] [address]  Lactic acid, plasma     Status: Abnormal   Collection Time: 08/06/18  8:15 PM  Result Value Ref Range   Lactic Acid, Venous 3.4 (HH) 0.5 - 1.9 mmol/L    Comment: CRITICAL RESULT CALLED TO, READ BACK BY AND VERIFIED WITH Alban Marucci HADEN @2101  08/06/18 MJU Performed at [facility], [address] [place], [place] [address]   SARS Coronavirus 2 ([place] - Performed in [place] hospital lab), Hosp Order     Status: None   Collection Time: 08/06/18  8:15 PM  Result Value Ref Range    SARS Coronavirus 2 NEGATIVE NEGATIVE    Comment: (NOTE) If result is NEGATIVE SARS-CoV-2 target nucleic acids are NOT DETECTED. The SARS-CoV-2 RNA is generally detectable in upper and lower  respiratory specimens during the acute phase of infection. The lowest  concentration of SARS-CoV-2 viral copies this assay can detect is 250  copies / mL. A negative result does not preclude SARS-CoV-2 infection  and should not be used as the sole basis for treatment or other  patient management decisions.  A negative result may occur with  improper specimen collection / handling, submission of specimen other  than nasopharyngeal swab, presence of viral mutation(s) within the  areas targeted by this assay, and inadequate number of viral copies  (<250 copies / mL). A negative result must be combined with clinical  observations, patient history, and epidemiological information. If result is POSITIVE SARS-CoV-2 target nucleic acids are DETECTED. The SARS-CoV-2 RNA is generally detectable in upper and lower  respiratory specimens dur ing the acute phase of infection.  Positive  results are indicative of active infection with SARS-CoV-2.  Clinical  correlation with patient history and other diagnostic information is  necessary to determine patient infection status.  Positive results do  not rule out bacterial infection or co-infection with other viruses. If result is PRESUMPTIVE POSTIVE SARS-CoV-2 nucleic acids MAY BE PRESENT.   A presumptive positive result was obtained on the submitted specimen  and confirmed on repeat testing.  While 2019 novel coronavirus  (SARS-CoV-2) nucleic acids may be present in the submitted sample  additional confirmatory testing may be necessary for epidemiological  and / or clinical management purposes  to differentiate between  SARS-CoV-2 and other Sarbecovirus currently known to infect humans.  If clinically indicated additional testing with an alternate test   methodology [phone]) is advised. The SARS-CoV-2 RNA is generally  detectable in upper and lower respiratory sp ecimens during the acute  phase of infection. The expected result is Negative. Fact Sheet for Patients:  StrictlyIdeas.no Fact Sheet for Healthcare Providers: BankingDealers.co.za This test is not yet approved or cleared by the [place] FDA and has been authorized for detection and/or diagnosis of SARS-CoV-2 by FDA under an Emergency Use Authorization (EUA).  This EUA will remain in effect (meaning this test can be used) for the duration of the COVID-19 declaration under Section 564(b)(1) of the Act, 21 U.S.C. section 360bbb-3(b)(1), unless the authorization is terminated or revoked sooner. Performed at [facility], [address] [place], [place] [address]   Blood gas, arterial     Status: Abnormal   Collection Time: 08/06/18  8:15 PM  Result Value Ref Range   FIO2 0.28    Delivery systems BILEVEL POSITIVE AIRWAY PRESSURE    LHR 8 resp/min   Inspiratory PAP 10    Expiratory PAP 5    pH, Arterial 7.39 7.350 - 7.450   pCO2 arterial 31 (L) 32.0 - 48.0 mmHg   pO2, Arterial 127 (H) 83.0 - 108.0 mmHg   Bicarbonate 18.8 (L) 20.0 - 28.0 mmol/L   Acid-base deficit 5.1 (H) 0.0 - 2.0 mmol/L   O2 Saturation 98.9 %   Patient temperature 37.0    Collection site RIGHT RADIAL    Sample type ARTERIAL DRAW    Allens test (pass/fail) PASS PASS    Comment: Performed at [facility], [address] [place], [place] [address]   Dg Chest Portable 1 View  Result Date: 08/06/2018 CLINICAL DATA:  Shortness of breath EXAM: PORTABLE CHEST 1 VIEW COMPARISON:  08/06/2018, 06/29/2018, PET-CT 05/07/2018 FINDINGS: Surgical hardware in the cervical spine. Right-sided central venous port tip over the SVC. Cardiomegaly with vascular congestion. Diffuse interstitial opacity, no significant change as compared with radiograph  performed earlier today. No pleural effusion or pneumothorax. IMPRESSION: No significant interval change in cardiomegaly, mild vascular congestion and diffuse interstitial opacity since radiograph performed earlier today. Electronically Signed   By: Donavan Foil M.D.   On: 08/06/2018 23:40   Dg Chest Portable 1 View  Result Date: 08/06/2018 CLINICAL DATA:  63 year old female with respiratory distress, shortness of breath. Recent weight gain. Hypertensive on presentation. Pending COVID-19 status. History of ovarian cancer. EXAM: PORTABLE CHEST 1 VIEW COMPARISON:  06/29/2018 and earlier. FINDINGS: Portable AP semi upright view at 2001 hours. Stable right chest power port. Stable cardiac size and mediastinal contours. Lower lung volumes with ongoing diffuse pulmonary interstitial opacity, basilar predominance. This is similar to but increased compared to April. No superimposed pneumothorax, pleural effusion or consolidation. Prior cervical ACDF. No acute osseous abnormality identified. IMPRESSION: Lower lung volumes with ongoing diffuse pulmonary interstitial  opacity, similar to but increased compared to April. Differential considerations include viral/atypical respiratory infection, pulmonary edema, less likely lymphangitic carcinomatosis. Electronically Signed   By: Genevie Ann M.D.   On: 08/06/2018 20:28   Ct Renal Stone Study  Result Date: 08/06/2018 CLINICAL DATA:  History of ovarian cancer. Congestive heart failure. Decreasing GFR. Evaluate for renal obstruction. EXAM: CT ABDOMEN AND PELVIS WITHOUT CONTRAST TECHNIQUE: Multidetector CT imaging of the abdomen and pelvis was performed following the standard protocol without IV contrast. COMPARISON:  05/07/2018 PET-CT. 02/02/2018 CT abdomen and pelvis. FINDINGS: Lower chest: Interlobular septal thickening and hazy central ground-glass opacifications of the lung bases. Small bilateral pleural effusions. Mild cardiomegaly. Hepatobiliary: No focal liver abnormality  is seen. Status post cholecystectomy. No biliary dilatation. Pancreas: Unremarkable. No pancreatic ductal dilatation or surrounding inflammatory changes. Spleen: Normal in size without focal abnormality. Adrenals/Urinary Tract: Adrenal glands are unremarkable. Kidneys are normal, without renal calculi, focal lesion, or hydronephrosis. Bladder is unremarkable. Stomach/Bowel: Stomach is within normal limits. Appendix appears normal. No evidence of bowel wall thickening, distention, or inflammatory changes. Vascular/Lymphatic: Aortic atherosclerosis. No enlarged abdominal or pelvic lymph nodes. Reproductive: Status post hysterectomy. No adnexal masses. Other: Supraumbilical hernia containing fat is mildly increased in size. Stable postsurgical changes within the ventral abdominal wall. Nodularity of the left upper quadrant omentum is stable from prior PET-CT (series 2, image 33). Small volume of ascites. Mild edema within the subcutaneous soft tissues. Musculoskeletal: No fracture is seen. L4-5 posterior instrumented fusion and laminectomy chronic postsurgical changes. No apparent hardware related complication. IMPRESSION: 1. No hydronephrosis or urinary stone disease identified. Unremarkable non-contrast CT appearance of the kidneys. 2. Interstitial and alveolar pulmonary edema, small bilateral pleural effusions, small volume of ascites, mild edema within subcutaneous soft tissues. 3. Stable nodularity of the left upper quadrant omentum from prior PET-CT. Electronically Signed   By: Kristine Garbe M.D.   On: 08/06/2018 22:31    Pending Labs Unresulted Labs (From admission, onward)    Start     Ordered   08/07/18 3546  Basic metabolic panel  Daily,   STAT     08/06/18 2355   08/06/18 2339  HIV antibody (Routine Testing)  Once,   STAT     08/06/18 2355   08/06/18 2014  Pathologist smear review  Once,   STAT     08/06/18 2014   08/06/18 2002  Lactic acid, plasma  Now then every 2 hours,   STAT      08/06/18 2002          Vitals/Pain Today's Vitals   08/07/18 0030 08/07/18 0045 08/07/18 0100 08/07/18 0118  BP: (!) 143/132 (!) 182/119  (!) 174/103  Pulse:   (!) 135 (!) 116  Resp: (!) 32  (!) 22 20  Temp:      TempSrc:      SpO2:   94% 100%  Weight:      Height:      PainSc:    1     Isolation Precautions No active isolations  Medications Medications  sodium chloride flush (NS) 0.9 % injection 3 mL (has no administration in time range)  sodium chloride flush (NS) 0.9 % injection 3 mL (has no administration in time range)  0.9 %  sodium chloride infusion (has no administration in time range)  acetaminophen (TYLENOL) tablet 650 mg (has no administration in time range)  ondansetron (ZOFRAN) injection 4 mg (has no administration in time range)  furosemide (LASIX) injection 40 mg (has no  administration in time range)  metoprolol succinate (TOPROL-XL) 24 hr tablet 25 mg (has no administration in time range)  furosemide (LASIX) injection 20 mg (20 mg Intravenous Given 08/06/18 2052)  enalaprilat (VASOTEC) injection 1.25 mg (1.25 mg Intravenous Given 08/06/18 2036)  nitroGLYCERIN (NITROGLYN) 2 % ointment 0.5 inch (0.5 inches Topical Given 08/06/18 2136)  furosemide (LASIX) injection 40 mg (40 mg Intravenous Given 08/06/18 2245)  LORazepam (ATIVAN) injection 0.5 mg (0.5 mg Intravenous Given 08/06/18 2244)  LORazepam (ATIVAN) injection 1 mg (1 mg Intravenous Given 08/06/18 2329)  ipratropium-albuterol (DUONEB) 0.5-2.5 (3) MG/3ML nebulizer solution 3 mL (3 mLs Nebulization Given 08/06/18 2329)  nitroGLYCERIN (NITROGLYN) 2 % ointment 1 inch (1 inch Topical Given 08/06/18 2359)    Mobility walks Low fall risk   Focused Assessments Cardiac Assessment Handoff:    Lab Results  Component Value Date   TROPONINI 0.06 ([place]) 08/06/2018   No results found for: DDIMER Does the Patient currently have chest pain? No  , Pulmonary Assessment Handoff:  Lung sounds: Bilateral Breath Sounds:  Rhonchi L Breath Sounds: Rhonchi R Breath Sounds: Rhonchi O2 Device: Bi-PAP        R Recommendations: See Admitting Provider Note  Report given to:   Additional Notes: Pt's lactic was elevated but not repeated because no septic intervention was performed. Pt has CHF and respiratory distress, diuretics administered, no fluids given.

## 2018-08-07 NOTE — Progress Notes (Signed)
Patient has pill box with multiple medications in slots. Pill box labeled and put in patient belonging bag; bag placed in closet in patient's room out of patient's reach.

## 2018-08-07 NOTE — Consult Note (Signed)
[facility] CONSULT NOTE    Date: 08/07/2018                  Patient Name:  Jessica Burgess  MRN: [id number]  DOB: [birth year]  Age / Sex: 63 y.o., female         PCP: Steele Sizer, MD                 Service Requesting Consult: Hospitalist                 Reason for Consult: Acute renal failure/CKD stage III            History of Present Illness: Patient is a 63 y.o. female with a PMHx of ovarian cancer stage III, chronic kidney disease stage III baseline creatinine 1.7 with EGFR of 30, GERD, migraine headaches, cervical spinal stenosis, who was admitted to [facility] on 08/06/2018 for evaluation of significant shortness of breath.  Patient has been having progressive shortness of breath over the past several days.  She has been receiving gemcitabine as chemotherapy for her underlying ovarian cancer.  She previously underwent debulking surgery.  She appears to have chronic kidney disease with most recent baseline creatinine of 1.7 with an EGFR of 30.  CT scan of the abdomen pelvis was performed and there was no sign of obstruction.  Patient found to have cardiomegaly on chest x-ray with mild vascular congestion.  Echocardiogram was performed and does show reduced ejection fraction of 40 to 45%.  There was also impaired relaxation.  Patient has been administered Lasix and is feeling better.  Creatinine currently 3.42.   Medications: Outpatient medications: Medications Prior to Admission  Medication Sig Dispense Refill Last Dose  . Ascorbic Acid (VITAMIN C PO) Take 1 tablet by mouth daily.    08/05/2018 at am  . budesonide-formoterol (SYMBICORT) 160-4.5 MCG/ACT inhaler Inhale 2 puffs into the lungs 2 (two) times daily.   08/05/2018 at pm  . Cholecalciferol (VITAMIN D-1000 MAX ST) 1000 UNITS tablet Take 1 tablet by mouth 2 (two) times daily.   08/05/2018 at am  . Cyanocobalamin (B-12 PO) Take 1 tablet by mouth daily.    08/05/2018 at am  . DULoxetine (CYMBALTA) 60 MG  capsule TAKE 1 CAPSULE(60 MG) BY MOUTH DAILY 180 capsule 1 08/05/2018 at am  . estradiol (ESTRACE) 0.5 MG tablet Take 1 tablet (0.5 mg total) by mouth daily. 90 tablet 4 08/05/2018 at am  . ferrous sulfate (IRON SUPPLEMENT) 325 (65 FE) MG tablet Take 1 tablet by mouth daily.   08/05/2018 at am  . gabapentin (NEURONTIN) 300 MG capsule Take 1-3 capsules (300-900 mg total) by mouth 3 (three) times daily. 300 mg twice a day and 900 mg at night 540 capsule 1 08/05/2018 at pm  . ipratropium (ATROVENT HFA) 17 MCG/ACT inhaler Inhale 2 puffs into the lungs every 6 (six) hours as needed.    08/05/2018 at pm  . levothyroxine (SYNTHROID) 25 MCG tablet Take 1 tablet (25 mcg total) by mouth daily before breakfast. 30 tablet 2 08/05/2018 at am  . Lysine 1000 MG TABS Take 1 tablet by mouth daily.    08/05/2018 at am  . Magnesium Oxide 500 MG CAPS Take 500 mg by mouth 2 (two) times daily.    08/05/2018 at am  . Multiple Vitamins-Minerals (MULTIVITAMIN PO) Take 1 tablet by mouth daily.    08/05/2018 at am  . Omega-3 Fatty Acids (FISH OIL PO) Take 1 capsule by mouth 2 (  two) times daily.    08/05/2018 at am  . oxyCODONE-acetaminophen (PERCOCET) 10-325 MG tablet Take 1 tablet by mouth every 6 (six) hours as needed for pain. 120 tablet 0 08/05/2018 at pm  . potassium gluconate 595 MG TABS tablet Take 595 mg by mouth daily.   08/05/2018 at am  . temazepam (RESTORIL) 15 MG capsule TAKE 1 CAPSULE(15 MG) BY MOUTH AT BEDTIME AS NEEDED FOR SLEEP 30 capsule 2 08/05/2018 at pm  . albuterol (PROVENTIL HFA;VENTOLIN HFA) 108 (90 Base) MCG/ACT inhaler Inhale 2 puffs into the lungs every 6 (six) hours as needed for wheezing or shortness of breath. 1 Inhaler 2 prn at prn  . albuterol (PROVENTIL) (2.5 MG/3ML) 0.083% nebulizer solution Take 3 mLs (2.5 mg total) by nebulization every 6 (six) hours as needed for wheezing or shortness of breath. 75 mL 2 prn at prn  . cyclobenzaprine (FLEXERIL) 10 MG tablet Take one tablet every 8 hours as needed 30  tablet 3 prn at prn  . fluticasone (FLONASE) 50 MCG/ACT nasal spray Place 2 sprays into both nostrils as needed. 48 g 1 prn at prn  . lidocaine (XYLOCAINE) 2 % solution Use as directed 15 mLs in the mouth or throat as needed for mouth pain. 100 mL 0 prn at prn  . ondansetron (ZOFRAN-ODT) 8 MG disintegrating tablet Take 1 tablet (8 mg total) by mouth every 8 (eight) hours as needed for nausea or vomiting. 60 tablet 3 prn at prn  . SUMAtriptan (IMITREX) 100 MG tablet May repeat in 2 hours if headache persists or recurs. 9 tablet 0 prn at prn  . valACYclovir (VALTREX) 1000 MG tablet TAKE 1 TABLET(1000 MG) BY MOUTH THREE TIMES DAILY FOR 7 DAYS (Patient not taking: Reported on 08/03/2018) 21 tablet 0 Not Taking    Current medications: Current Facility-Administered Medications  Medication Dose Route Frequency Provider Last Rate Last Dose  . 0.9 %  sodium chloride infusion  250 mL Intravenous PRN Seals, Levada Dy H, NP      . acetaminophen (TYLENOL) tablet 650 mg  650 mg Oral Q4H PRN Seals, Theo Dills, NP      . cyclobenzaprine (FLEXERIL) tablet 10 mg  10 mg Oral TID PRN Bradly Bienenstock, NP      . DULoxetine (CYMBALTA) DR capsule 60 mg  60 mg Oral Daily Flora Lipps, MD   60 mg at 08/07/18 1015  . ferrous sulfate tablet 325 mg  325 mg Oral Daily Darel Hong D, NP   325 mg at 08/07/18 0957  . fluticasone (FLONASE) 50 MCG/ACT nasal spray 2 spray  2 spray Each Nare PRN Darel Hong D, NP      . furosemide (LASIX) injection 40 mg  40 mg Intravenous Daily Square Jowett, MD   40 mg at 08/07/18 0955  . gabapentin (NEURONTIN) capsule 300 mg  300 mg Oral TID Darel Hong D, NP   300 mg at 08/07/18 0955  . heparin injection 5,000 Units  5,000 Units Subcutaneous Q8H Flora Lipps, MD   5,000 Units at 08/07/18 1015  . hydrALAZINE (APRESOLINE) injection 10 mg  10 mg Intravenous Q4H PRN Darel Hong D, NP      . ipratropium-albuterol (DUONEB) 0.5-2.5 (3) MG/3ML nebulizer solution 3 mL  3 mL Nebulization  Q4H PRN Darel Hong D, NP      . labetalol (NORMODYNE) injection 20 mg  20 mg Intravenous Q6H PRN Darel Hong D, NP   20 mg at 08/07/18 0410  . levothyroxine (SYNTHROID) tablet 25  mcg  25 mcg Oral QAC breakfast Darel Hong D, NP   25 mcg at 08/07/18 0849  . lidocaine (XYLOCAINE) 2 % viscous mouth solution 15 mL  15 mL Mouth/Throat PRN Darel Hong D, NP      . metoprolol succinate (TOPROL-XL) 24 hr tablet 25 mg  25 mg Oral Daily Seals, Angela H, NP   25 mg at 08/07/18 0955  . mometasone-formoterol (DULERA) 200-5 MCG/ACT inhaler 2 puff  2 puff Inhalation BID Darel Hong D, NP   2 puff at 08/07/18 [id number]  . morphine 2 MG/ML injection 1-2 mg  1-2 mg Intravenous Q4H PRN Darel Hong D, NP   2 mg at 08/07/18 0410  . ondansetron (ZOFRAN) injection 4 mg  4 mg Intravenous Q6H PRN Seals, Theo Dills, NP      . oxyCODONE-acetaminophen (PERCOCET/ROXICET) 5-325 MG per tablet 1 tablet  1 tablet Oral Q6H PRN Mansy, Jan A, MD       And  . oxyCODONE (Oxy IR/ROXICODONE) immediate release tablet 5 mg  5 mg Oral Q6H PRN Mansy, Jan A, MD   5 mg at 08/07/18 1202  . sodium chloride flush (NS) 0.9 % injection 3 mL  3 mL Intravenous Q12H Seals, Angela H, NP   3 mL at 08/07/18 1012  . sodium chloride flush (NS) 0.9 % injection 3 mL  3 mL Intravenous PRN Seals, Angela H, NP      . temazepam (RESTORIL) capsule 15 mg  15 mg Oral QHS PRN Bradly Bienenstock, NP       Facility-Administered Medications Ordered in Other Encounters  Medication Dose Route Frequency Provider Last Rate Last Dose  . 0.9 %  sodium chloride infusion   Intravenous Once Lloyd Huger, MD      . 0.9 %  sodium chloride infusion   Intravenous Once Lloyd Huger, MD      . dexamethasone (DECADRON) 20 mg in sodium chloride 0.9 % 50 mL IVPB  20 mg Intravenous Once Lloyd Huger, MD      . dexamethasone (DECADRON) injection 10 mg  10 mg Intravenous Once Lloyd Huger, MD      . sodium chloride flush (NS) 0.9 % injection  10 mL  10 mL Intravenous PRN Lloyd Huger, MD   10 mL at 06/16/17 0854      Allergies: No Known Allergies    Past Medical History: Past Medical History:  Diagnosis Date  . Allergic rhinitis, cause unspecified   . Anxiety state, unspecified   . Arthritis   . Asthma    only when sick   . Backache, unspecified   . Bronchitis    hx of when get sick  . Cancer ([place])    skin cancer , basal cell   . Cancer ([place]) 11/2016   ovarian  . Cervicalgia   . Complication of anesthesia   . Dermatophytosis of nail   . Dysmetabolic syndrome X   . Encounter for long-term (current) use of other medications   . Esophageal reflux   . Insomnia, unspecified   . Leukocytosis, unspecified   . Migraine without aura, without mention of intractable migraine without mention of status migrainosus   . Other and unspecified hyperlipidemia   . Other malaise and fatigue   . Overweight(278.02)   . Personal history of chemotherapy now   ovarian  . PONV (postoperative nausea and vomiting)   . Spinal stenosis in cervical region   . Symptomatic menopausal or female climacteric states   .  Unspecified disorder of skin and subcutaneous tissue   . Unspecified vitamin D deficiency      Past Surgical History: Past Surgical History:  Procedure Laterality Date  . ABDOMINAL HYSTERECTOMY    . ANTERIOR CERVICAL DECOMP/DISCECTOMY FUSION N/A 06/07/2015   Procedure: Cervical three - four and Cervical six- seven anterior cervical decompression with fusion interbody prosthesis plating and bonegraft;  Surgeon: Newman Pies, MD;  Location: [place] [facility];  Service: Neurosurgery;  Laterality: N/A;  C34 and C67 anterior cervical decompression with fusion interbody prosthesis plating and bonegraft  . BACK SURGERY     x2 Lower   . EVACUATION OF CERVICAL HEMATOMA N/A 06/14/2015   Procedure: EVACUATION OF CERVICAL HEMATOMA;  Surgeon: Newman Pies, MD;  Location: [place] [facility];  Service: Neurosurgery;  Laterality: N/A;   . NECK SURGERY     x3  . TONSILLECTOMY       Family History: Family History  Problem Relation Age of Onset  . Depression Mother   . Migraines Mother   . Dementia Father   . Diabetes Father   . Hyperlipidemia Father   . Hyperlipidemia Brother   . Hyperlipidemia Brother   . Breast cancer Paternal Aunt        76s     Social History: Social History   Socioeconomic History  . Marital status: Single    Spouse name: Not on file  . Number of children: 0  . Years of education: some college  . Highest education level: 12th grade  Occupational History    Employer: DISABLED  . Occupation: Disabled   Social Needs  . Financial resource strain: Very hard  . Food insecurity:    Worry: Never true    Inability: Never true  . Transportation needs:    Medical: No    Non-medical: No  Tobacco Use  . Smoking status: Former Smoker    Packs/day: 1.50    Years: 20.00    Pack years: 30.00    Types: Cigarettes    Start date: 03/19/1979    Last attempt to quit: 08/25/1999    Years since quitting: 18.9  . Smokeless tobacco: Never Used  . Tobacco comment: smoking cessation materials not required  Substance and Sexual Activity  . Alcohol use: Not Currently    Alcohol/week: 0.0 standard drinks  . Drug use: No  . Sexual activity: Never  Lifestyle  . Physical activity:    Days per week: 0 days    Minutes per session: 0 min  . Stress: Very much  Relationships  . Social connections:    Talks on phone: Patient refused    Gets together: Patient refused    Attends religious service: Patient refused    Active member of club or organization: Patient refused    Attends meetings of clubs or organizations: Patient refused    Relationship status: Patient refused  . Intimate partner violence:    Fear of current or ex partner: No    Emotionally abused: No    Physically abused: No    Forced sexual activity: No  Other Topics Concern  . Not on file  Social History Narrative   Patient is  single.    Patient lives with roommates.    Patient on disability    Patient has no children.    Patient has some college      Review of Systems: Review of Systems  Constitutional: Negative for chills and fever.  HENT: Negative for hearing loss and tinnitus.   Eyes:  Negative for blurred vision and double vision.  Respiratory: Positive for cough and shortness of breath.   Cardiovascular: Positive for orthopnea, leg swelling and PND.  Gastrointestinal: Positive for abdominal pain. Negative for heartburn, nausea and vomiting.  Genitourinary: Negative for dysuria, frequency and urgency.  Musculoskeletal: Negative for joint pain and myalgias.  Skin: Negative for itching and rash.  Neurological: Positive for dizziness and weakness.  Endo/Heme/Allergies: Negative for polydipsia. Does not bruise/bleed easily.  Psychiatric/Behavioral: Negative for depression. The patient is not nervous/anxious.      Vital Signs: Blood pressure (!) 168/94, pulse 92, temperature 98.8 F (37.1 C), temperature source Axillary, resp. rate (!) 24, height _0  (1.575 m), weight 78.3 kg, SpO2 95 %.  Weight trends: Filed Weights   08/06/18 2008 08/07/18 0240  Weight: 79.4 kg 78.3 kg    Physical Exam: General: NAD,   Head: Normocephalic, atraumatic.  Eyes: Anicteric, EOMI  Nose: Mucous membranes moist, not inflammed, nonerythematous.  Throat: Oropharynx nonerythematous, no exudate appreciated.   Neck: Supple, trachea midline.  Lungs:  Normal respiratory effort. Clear to auscultation BL without crackles or wheezes.  Heart: RRR. S1 and S2 normal without gallop, murmur, or rubs.  Abdomen:  BS normoactive. Soft, Nondistended, non-tender.  No masses or organomegaly.  Extremities: No pretibial edema.  Neurologic: A&O X3, Motor strength is 5/5 in the all 4 extremities  Skin: No visible rashes, scars.    Lab results: Basic Metabolic Panel: Recent Labs  Lab 08/05/18 0829 08/06/18 2014 08/07/18 0340  NA  137 138 139  K 5.2* 5.5* 5.5*  CL 105 107 107  CO2 24 20* 21*  GLUCOSE 119* 148* 119*  BUN 59* 69* 73*  CREATININE 3.20* 3.19* 3.42*  CALCIUM 8.0* 8.3* 8.4*    Liver Function Tests: Recent Labs  Lab 08/03/18 0824 08/05/18 0829 08/06/18 2014  AST 38 32 49*  ALT _1 ALKPHOS 58 61 66  BILITOT 0.8 0.9 1.2  PROT 5.7* 5.7* 6.5  ALBUMIN 3.1* 2.9* 3.3*   No results for input(s): LIPASE, AMYLASE in the last 168 hours. No results for input(s): AMMONIA in the last 168 hours.  CBC: Recent Labs  Lab 08/03/18 0824 08/05/18 0829 08/06/18 2014 08/07/18 0340  WBC 5.5 4.6 12.8* 13.0*  NEUTROABS 2.8 2.7 10.6*  --   HGB 5.9* 8.1* 8.6* 8.7*  HCT 17.5* 24.1* 26.5* 27.2*  MCV 109.4* 103.0* 105.6* 107.5*  PLT 39* 45* 58* 49*    Cardiac Enzymes: Recent Labs  Lab 08/06/18 2014 08/07/18 0340 08/07/18 0832  TROPONINI 0.06* 1.10* 0.81*    BNP: Invalid input(s): POCBNP  CBG: Recent Labs  Lab 08/07/18 0244  GLUCAP 111*    Microbiology: Results for orders placed or performed during the hospital encounter of 08/06/18  SARS Coronavirus 2 (CEPHEID - Performed in [facility] hospital lab), Hosp Order     Status: None   Collection Time: 08/06/18  8:15 PM  Result Value Ref Range Status   SARS Coronavirus 2 NEGATIVE NEGATIVE Final    Comment: (NOTE) If result is NEGATIVE SARS-CoV-2 target nucleic acids are NOT DETECTED. The SARS-CoV-2 RNA is generally detectable in upper and lower  respiratory specimens during the acute phase of infection. The lowest  concentration of SARS-CoV-2 viral copies this assay can detect is 250  copies / mL. A negative result does not preclude SARS-CoV-2 infection  and should not be used as the sole basis for treatment or other  patient management decisions.  A negative result may  occur with  improper specimen collection / handling, submission of specimen other  than nasopharyngeal swab, presence of viral mutation(s) within the  areas targeted by  this assay, and inadequate number of viral copies  (<250 copies / mL). A negative result must be combined with clinical  observations, patient history, and epidemiological information. If result is POSITIVE SARS-CoV-2 target nucleic acids are DETECTED. The SARS-CoV-2 RNA is generally detectable in upper and lower  respiratory specimens dur ing the acute phase of infection.  Positive  results are indicative of active infection with SARS-CoV-2.  Clinical  correlation with patient history and other diagnostic information is  necessary to determine patient infection status.  Positive results do  not rule out bacterial infection or co-infection with other viruses. If result is PRESUMPTIVE POSTIVE SARS-CoV-2 nucleic acids MAY BE PRESENT.   A presumptive positive result was obtained on the submitted specimen  and confirmed on repeat testing.  While 2019 novel coronavirus  (SARS-CoV-2) nucleic acids may be present in the submitted sample  additional confirmatory testing may be necessary for epidemiological  and / or clinical management purposes  to differentiate between  SARS-CoV-2 and other Sarbecovirus currently known to infect humans.  If clinically indicated additional testing with an alternate test  methodology [phone]) is advised. The SARS-CoV-2 RNA is generally  detectable in upper and lower respiratory sp ecimens during the acute  phase of infection. The expected result is Negative. Fact Sheet for Patients:  StrictlyIdeas.no Fact Sheet for Healthcare Providers: BankingDealers.co.za This test is not yet approved or cleared by the [place] FDA and has been authorized for detection and/or diagnosis of SARS-CoV-2 by FDA under an Emergency Use Authorization (EUA).  This EUA will remain in effect (meaning this test can be used) for the duration of the COVID-19 declaration under Section 564(b)(1) of the Act, 21 U.S.C. section  360bbb-3(b)(1), unless the authorization is terminated or revoked sooner. Performed at [facility], [address] [place], [place] [address]   MRSA PCR Screening     Status: None   Collection Time: 08/07/18  2:38 AM  Result Value Ref Range Status   MRSA by PCR NEGATIVE NEGATIVE Final    Comment:        The GeneXpert MRSA Assay (FDA approved for NASAL specimens only), is one component of a comprehensive MRSA colonization surveillance program. It is not intended to diagnose MRSA infection nor to guide or monitor treatment for MRSA infections. Performed at [facility], [address] [place], [place] [address]   CULTURE, BLOOD (ROUTINE X 2) w Reflex to ID Panel     Status: None (Preliminary result)   Collection Time: 08/07/18  3:40 AM  Result Value Ref Range Status   Specimen Description BLOOD LEFT ANTECUBITAL  Final   Special Requests   Final    BOTTLES DRAWN AEROBIC AND ANAEROBIC Blood Culture results may not be optimal due to an excessive volume of blood received in culture bottles   Culture   Final    NO GROWTH < 12 HOURS Performed at [facility], [address] [place], [place] [address]    Report Status PENDING  Incomplete  CULTURE, BLOOD (ROUTINE X 2) w Reflex to ID Panel     Status: None (Preliminary result)   Collection Time: 08/07/18  3:40 AM  Result Value Ref Range Status   Specimen Description BLOOD BLOOD LEFT HAND  Final   Special Requests   Final    BOTTLES DRAWN AEROBIC AND  ANAEROBIC Blood Culture results may not be optimal due to an excessive volume of blood received in culture bottles   Culture   Final    NO GROWTH < 12 HOURS Performed at [facility], [address] [place], [place] [address]    Report Status PENDING  Incomplete    Coagulation Studies: No results for input(s): LABPROT, INR in the last 72 hours.  Urinalysis: No results for input(s): COLORURINE, LABSPEC, PHURINE, GLUCOSEU, HGBUR,  BILIRUBINUR, KETONESUR, PROTEINUR, UROBILINOGEN, NITRITE, LEUKOCYTESUR in the last 72 hours.  Invalid input(s): APPERANCEUR    Imaging: US Venous Img Lower Bilateral  Result Date: 08/07/2018 CLINICAL DATA:  Elevated D-dimer. History of ovarian cancer. Evaluate for DVT. EXAM: BILATERAL LOWER EXTREMITY VENOUS DOPPLER ULTRASOUND TECHNIQUE: Gray-scale sonography with graded compression, as well as color Doppler and duplex ultrasound were performed to evaluate the lower extremity deep venous systems from the level of the common femoral vein and including the common femoral, femoral, profunda femoral, popliteal and calf veins including the posterior tibial, peroneal and gastrocnemius veins when visible. The superficial great saphenous vein was also interrogated. Spectral Doppler was utilized to evaluate flow at rest and with distal augmentation maneuvers in the common femoral, femoral and popliteal veins. COMPARISON:  None. FINDINGS: RIGHT LOWER EXTREMITY Common Femoral Vein: No evidence of thrombus. Normal compressibility, respiratory phasicity and response to augmentation. Saphenofemoral Junction: No evidence of thrombus. Normal compressibility and flow on color Doppler imaging. Profunda Femoral Vein: No evidence of thrombus. Normal compressibility and flow on color Doppler imaging. Femoral Vein: No evidence of thrombus. Normal compressibility, respiratory phasicity and response to augmentation. Popliteal Vein: No evidence of thrombus. Normal compressibility, respiratory phasicity and response to augmentation. Calf Veins: No evidence of thrombus. Normal compressibility and flow on color Doppler imaging. Superficial Great Saphenous Vein: No evidence of thrombus. Normal compressibility. Venous Reflux:  None. Other Findings:  None. LEFT LOWER EXTREMITY Common Femoral Vein: No evidence of thrombus. Normal compressibility, respiratory phasicity and response to augmentation. Saphenofemoral Junction: No evidence of  thrombus. Normal compressibility and flow on color Doppler imaging. Profunda Femoral Vein: No evidence of thrombus. Normal compressibility and flow on color Doppler imaging. Femoral Vein: No evidence of thrombus. Normal compressibility, respiratory phasicity and response to augmentation. Popliteal Vein: No evidence of thrombus. Normal compressibility, respiratory phasicity and response to augmentation. Calf Veins: No evidence of thrombus. Normal compressibility and flow on color Doppler imaging. Superficial Great Saphenous Vein: No evidence of thrombus. Normal compressibility. Venous Reflux:  None. Other Findings: Note made of an approximately 2.3 x 3.6 x 0.8 cm serpiginous fluid collection with the left popliteal fossa. IMPRESSION: 1. No evidence of DVT within either lower extremity. 2. Incidentally noted approximately 3.6 cm left-sided Baker's cyst. Electronically Signed   By: Sandi Mariscal M.D.   On: 08/07/2018 07:49   Dg Chest Port 1 View  Result Date: 08/07/2018 CLINICAL DATA:  Acute CHF EXAM: PORTABLE CHEST 1 VIEW COMPARISON:  Yesterday FINDINGS: Cardiomegaly and vascular pedicle widening. Continued perihilar predominant interstitial airspace opacity. No evidence of pleural effusion or pneumothorax. Port on the right with tip at the SVC. IMPRESSION: Continued CHF pattern. Electronically Signed   By: Monte Fantasia M.D.   On: 08/07/2018 06:15   Dg Chest Portable 1 View  Result Date: 08/06/2018 CLINICAL DATA:  Shortness of breath EXAM: PORTABLE CHEST 1 VIEW COMPARISON:  08/06/2018, 06/29/2018, PET-CT 05/07/2018 FINDINGS: Surgical hardware in the cervical spine. Right-sided central venous port tip over the SVC. Cardiomegaly with vascular congestion. Diffuse interstitial  opacity, no significant change as compared with radiograph performed earlier today. No pleural effusion or pneumothorax. IMPRESSION: No significant interval change in cardiomegaly, mild vascular congestion and diffuse interstitial opacity  since radiograph performed earlier today. Electronically Signed   By: Donavan Foil M.D.   On: 08/06/2018 23:40   Dg Chest Portable 1 View  Result Date: 08/06/2018 CLINICAL DATA:  63 year old female with respiratory distress, shortness of breath. Recent weight gain. Hypertensive on presentation. Pending COVID-19 status. History of ovarian cancer. EXAM: PORTABLE CHEST 1 VIEW COMPARISON:  06/29/2018 and earlier. FINDINGS: Portable AP semi upright view at 2001 hours. Stable right chest power port. Stable cardiac size and mediastinal contours. Lower lung volumes with ongoing diffuse pulmonary interstitial opacity, basilar predominance. This is similar to but increased compared to April. No superimposed pneumothorax, pleural effusion or consolidation. Prior cervical ACDF. No acute osseous abnormality identified. IMPRESSION: Lower lung volumes with ongoing diffuse pulmonary interstitial opacity, similar to but increased compared to April. Differential considerations include viral/atypical respiratory infection, pulmonary edema, less likely lymphangitic carcinomatosis. Electronically Signed   By: Genevie Ann M.D.   On: 08/06/2018 20:28   Ct Renal Stone Study  Result Date: 08/06/2018 CLINICAL DATA:  History of ovarian cancer. Congestive heart failure. Decreasing GFR. Evaluate for renal obstruction. EXAM: CT ABDOMEN AND PELVIS WITHOUT CONTRAST TECHNIQUE: Multidetector CT imaging of the abdomen and pelvis was performed following the standard protocol without IV contrast. COMPARISON:  05/07/2018 PET-CT. 02/02/2018 CT abdomen and pelvis. FINDINGS: Lower chest: Interlobular septal thickening and hazy central ground-glass opacifications of the lung bases. Small bilateral pleural effusions. Mild cardiomegaly. Hepatobiliary: No focal liver abnormality is seen. Status post cholecystectomy. No biliary dilatation. Pancreas: Unremarkable. No pancreatic ductal dilatation or surrounding inflammatory changes. Spleen: Normal in size  without focal abnormality. Adrenals/Urinary Tract: Adrenal glands are unremarkable. Kidneys are normal, without renal calculi, focal lesion, or hydronephrosis. Bladder is unremarkable. Stomach/Bowel: Stomach is within normal limits. Appendix appears normal. No evidence of bowel wall thickening, distention, or inflammatory changes. Vascular/Lymphatic: Aortic atherosclerosis. No enlarged abdominal or pelvic lymph nodes. Reproductive: Status post hysterectomy. No adnexal masses. Other: Supraumbilical hernia containing fat is mildly increased in size. Stable postsurgical changes within the ventral abdominal wall. Nodularity of the left upper quadrant omentum is stable from prior PET-CT (series 2, image 33). Small volume of ascites. Mild edema within the subcutaneous soft tissues. Musculoskeletal: No fracture is seen. L4-5 posterior instrumented fusion and laminectomy chronic postsurgical changes. No apparent hardware related complication. IMPRESSION: 1. No hydronephrosis or urinary stone disease identified. Unremarkable non-contrast CT appearance of the kidneys. 2. Interstitial and alveolar pulmonary edema, small bilateral pleural effusions, small volume of ascites, mild edema within subcutaneous soft tissues. 3. Stable nodularity of the left upper quadrant omentum from prior PET-CT. Electronically Signed   By: Kristine Garbe M.D.   On: 08/06/2018 22:31      Assessment & Plan: Pt is a 63 y.o. female with a PMHx of ovarian cancer stage III, chronic kidney disease stage III baseline creatinine 1.7 with EGFR of 30, GERD, migraine headaches, cervical spinal stenosis, who was admitted to [facility] on 08/06/2018 for evaluation of significant shortness of breath.  1.  Acute renal failure/chronic kidney disease stage III baseline creatinine 1.7/proteinuria.  Patient's baseline creatinine appears to be 1.7.  Creatinine currently significantly worse.  Could be related to cardiorenal syndrome.  However in the past she  is also had proteinuria as well as hematuria.  CT scan of the abdomen and pelvis was negative for hydronephrosis.  We will proceed with additional work-up including SPEP, UPEP, ANA, ANCA antibodies, GBM antibodies, C3, C4.  No urgent indication for dialysis at the moment however this may need to be considered if renal function continues to worsen.  Further plan as patient progresses.  2.  Acute systolic heart failure.  Ejection fraction noted to be low at 40 to 45%.  Agree with current diuresis strategy.  Case discussed in depth with cardiology.

## 2018-08-07 NOTE — Progress Notes (Addendum)
[facility] Progress Note Patient Name: Jessica [place] DOB: 22-Jul-1955 MRN: [id number]   Date of Service  08/07/2018  HPI/Events of Note  62/F with ovarian CA on chemotherapy, CKD, HTN, presenting with progressively worsening shortness of breath. The patient has received blood transfusion and IVFs over the past few days.  She was noted to have AKI.  CT reveals small bilateral pleural effusions and interstitial edema.   Lactic acid 3.4 Pt was in respiratory distress and placed on BIPAP. Pt feels breathing is better on BIPAP. 178/114, HR 114, RR 23, 100% O2 sats  eICU Interventions  Acute diastolic CHF Acute respiratory failure Ovarian CA  Continue diuresis. Monitor I&Os. Check 2d echo. Continue BIPAP.  Trial off BIPAP in the AM.  Recheck lactate.  Start on heparin for DVT prophylaxis.      Intervention Category Evaluation Type: New Patient Evaluation  Elsie Lincoln 08/07/2018, 2:40 AM

## 2018-08-07 NOTE — Progress Notes (Signed)
BiPAP removed and pt placed on 2L [place], pt tol well

## 2018-08-07 NOTE — Progress Notes (Signed)
*  PRELIMINARY RESULTS* Echocardiogram 2D Echocardiogram has been performed.  [place] 08/07/2018, 9:44 AM

## 2018-08-08 ENCOUNTER — Other Ambulatory Visit: Payer: Self-pay | Admitting: Oncology

## 2018-08-08 ENCOUNTER — Other Ambulatory Visit: Payer: Self-pay

## 2018-08-08 LAB — CBC WITH DIFFERENTIAL/PLATELET
Abs Immature Granulocytes: 0.06 10*3/uL (ref 0.00–0.07)
Basophils Absolute: 0 10*3/uL (ref 0.0–0.1)
Basophils Relative: 0 %
Eosinophils Absolute: 0.1 10*3/uL (ref 0.0–0.5)
Eosinophils Relative: 1 %
HCT: 23.4 % — ABNORMAL LOW (ref 36.0–46.0)
Hemoglobin: 7.5 g/dL — ABNORMAL LOW (ref 12.0–15.0)
Immature Granulocytes: 1 %
Lymphocytes Relative: 17 %
Lymphs Abs: 1.5 10*3/uL (ref 0.7–4.0)
MCH: 34.1 pg — ABNORMAL HIGH (ref 26.0–34.0)
MCHC: 32.1 g/dL (ref 30.0–36.0)
MCV: 106.4 fL — ABNORMAL HIGH (ref 80.0–100.0)
Monocytes Absolute: 1.6 10*3/uL — ABNORMAL HIGH (ref 0.1–1.0)
Monocytes Relative: 18 %
Neutro Abs: 5.6 10*3/uL (ref 1.7–7.7)
Neutrophils Relative %: 63 %
Platelets: 49 10*3/uL — ABNORMAL LOW (ref 150–400)
RBC: 2.2 MIL/uL — ABNORMAL LOW (ref 3.87–5.11)
RDW: 23.2 % — ABNORMAL HIGH (ref 11.5–15.5)
WBC: 8.8 10*3/uL (ref 4.0–10.5)
nRBC: 0 % (ref 0.0–0.2)

## 2018-08-08 LAB — URINALYSIS, COMPLETE (UACMP) WITH MICROSCOPIC
Bacteria, UA: NONE SEEN
Bilirubin Urine: NEGATIVE
Glucose, UA: NEGATIVE mg/dL
Ketones, ur: NEGATIVE mg/dL
Leukocytes,Ua: NEGATIVE
Nitrite: NEGATIVE
Protein, ur: 30 mg/dL — AB
Specific Gravity, Urine: 1.012 (ref 1.005–1.030)
pH: 5 (ref 5.0–8.0)

## 2018-08-08 LAB — BASIC METABOLIC PANEL
Anion gap: 11 (ref 5–15)
BUN: 79 mg/dL — ABNORMAL HIGH (ref 8–23)
CO2: 22 mmol/L (ref 22–32)
Calcium: 8 mg/dL — ABNORMAL LOW (ref 8.9–10.3)
Chloride: 104 mmol/L (ref 98–111)
Creatinine, Ser: 3.5 mg/dL — ABNORMAL HIGH (ref 0.44–1.00)
GFR calc Af Amer: 15 mL/min — ABNORMAL LOW (ref >60)
GFR calc non Af Amer: 13 mL/min — ABNORMAL LOW (ref >60)
Glucose, Bld: 100 mg/dL — ABNORMAL HIGH (ref 70–99)
Potassium: 5.1 mmol/L (ref 3.5–5.1)
Sodium: 137 mmol/L (ref 135–145)

## 2018-08-08 LAB — ANA W/REFLEX IF POSITIVE: Anti Nuclear Antibody (ANA): POSITIVE — AB

## 2018-08-08 LAB — ENA+DNA/DS+ANTICH+CENTRO+JO...
Anti JO-1: <0.2 AI (ref 0.0–0.9)
Centromere Ab Screen: <0.2 AI (ref 0.0–0.9)
Chromatin Ab SerPl-aCnc: <0.2 AI (ref 0.0–0.9)
ENA SM Ab Ser-aCnc: <0.2 AI (ref 0.0–0.9)
Ribonucleic Protein: 1.4 AI — ABNORMAL HIGH (ref 0.0–0.9)
SSA (Ro) (ENA) Antibody, IgG: <0.2 AI (ref 0.0–0.9)
SSB (La) (ENA) Antibody, IgG: <0.2 AI (ref 0.0–0.9)
Scleroderma (Scl-70) (ENA) Antibody, IgG: <0.2 AI (ref 0.0–0.9)
ds DNA Ab: <1 IU/mL (ref 0–9)

## 2018-08-08 LAB — HIV ANTIBODY (ROUTINE TESTING W REFLEX): HIV Screen 4th Generation wRfx: NONREACTIVE

## 2018-08-08 LAB — PROTEIN / CREATININE RATIO, URINE
Creatinine, Urine: 86 mg/dL
Protein Creatinine Ratio: 0.86 mg/mg{Cre} — ABNORMAL HIGH (ref 0.00–0.15)
Total Protein, Urine: 74 mg/dL

## 2018-08-08 LAB — C3 COMPLEMENT: C3 Complement: 124 mg/dL (ref 82–167)

## 2018-08-08 LAB — C4 COMPLEMENT: Complement C4, Body Fluid: 22 mg/dL (ref 14–44)

## 2018-08-08 MED ORDER — SUMATRIPTAN SUCCINATE 50 MG PO TABS
100.0000 mg | ORAL_TABLET | Freq: Once | ORAL | Status: AC
  Administered 2018-08-08: 100 mg via ORAL
  Filled 2018-08-08: qty 2

## 2018-08-08 MED ORDER — HYDRALAZINE HCL 25 MG PO TABS
12.5000 mg | ORAL_TABLET | Freq: Three times a day (TID) | ORAL | Status: DC
  Administered 2018-08-08 – 2018-08-09 (×3): 12.5 mg via ORAL
  Filled 2018-08-08 (×3): qty 1

## 2018-08-08 MED ORDER — ALPRAZOLAM 0.5 MG PO TABS
0.5000 mg | ORAL_TABLET | ORAL | Status: AC
  Administered 2018-08-08: 0.5 mg via ORAL
  Filled 2018-08-08: qty 1

## 2018-08-08 MED ORDER — FUROSEMIDE 10 MG/ML IJ SOLN
40.0000 mg | Freq: Two times a day (BID) | INTRAMUSCULAR | Status: DC
  Administered 2018-08-08 – 2018-08-09 (×2): 40 mg via INTRAVENOUS
  Filled 2018-08-08 (×2): qty 4

## 2018-08-08 MED ORDER — ISOSORBIDE MONONITRATE ER 30 MG PO TB24
30.0000 mg | ORAL_TABLET | Freq: Every day | ORAL | Status: DC
  Administered 2018-08-08 – 2018-08-18 (×9): 30 mg via ORAL
  Filled 2018-08-08 (×9): qty 1

## 2018-08-08 MED ORDER — LEVOTHYROXINE SODIUM 50 MCG PO TABS
25.0000 ug | ORAL_TABLET | Freq: Every day | ORAL | Status: DC
  Administered 2018-08-09 – 2018-08-18 (×9): 25 ug via ORAL
  Filled 2018-08-08 (×11): qty 1

## 2018-08-08 NOTE — Progress Notes (Signed)
Patient alert and oriented.  PRN pain medication given for back pain.  Patient has great appetite.  Able to make needs known.  Report given to Manuela Schwartz of 2a.  Will continue to monitor.

## 2018-08-08 NOTE — Progress Notes (Signed)
[facility]  ROUNDING NOTE   Subjective:  Overall renal function remains quite diminished. BUN 79 with a creatinine 3.5 and EGFR 13. Urine output was 975 cc over the preceding 24 hours. We talked about the potential for temporary renal replacement therapy with the patient.   Objective:  Vital signs in last 24 hours:  Temp:  [98.1 F (36.7 C)-99.5 F (37.5 C)] 98.4 F (36.9 C) (05/23 0800) Pulse Rate:  [81-105] 82 (05/23 1200) Resp:  [13-24] 19 (05/23 1200) BP: (133-165)/(77-98) 157/92 (05/23 1200) SpO2:  [89 %-100 %] 89 % (05/23 1200) Weight:  [77.8 kg] 77.8 kg (05/23 0422)  Weight change: -1.579 kg Filed Weights   08/06/18 2008 08/07/18 0240 08/08/18 0422  Weight: 79.4 kg 78.3 kg 77.8 kg    Intake/Output: I/O last 3 completed shifts: In: 350 [P.O.:350] Out: 1825 [Urine:1825]   Intake/Output this shift:  Total I/O In: 3 [I.V.:3] Out: -   Physical Exam: General: No acute distress  Head: Normocephalic, atraumatic. Moist oral mucosal membranes  Eyes: Anicteric  Neck: Supple, trachea midline  Lungs:  Basilar rales, normal effort  Heart: S1S2 no rubs  Abdomen:  Soft, nontender, bowel sounds present  Extremities: 1+ peripheral edema.  Neurologic: Awake, alert, following commands  Skin: No lesions       Basic Metabolic Panel: Recent Labs  Lab 08/03/18 0824 08/05/18 0829 08/06/18 2014 08/07/18 0340 08/08/18 0211  NA 129* 137 138 139 137  K 5.2* 5.2* 5.5* 5.5* 5.1  CL 96* 105 107 107 104  CO2 24 24 20* 21* 22  GLUCOSE 104* 119* 148* 119* 100*  BUN 58* 59* 69* 73* 79*  CREATININE 3.54* 3.20* 3.19* 3.42* 3.50*  CALCIUM 7.6* 8.0* 8.3* 8.4* 8.0*    Liver Function Tests: Recent Labs  Lab 08/03/18 0824 08/05/18 0829 08/06/18 2014  AST 38 32 49*  ALT '26 21 22  ' ALKPHOS 58 61 66  BILITOT 0.8 0.9 1.2  PROT 5.7* 5.7* 6.5  ALBUMIN 3.1* 2.9* 3.3*   No results for input(s): LIPASE, AMYLASE in the last 168 hours. No results for input(s):  AMMONIA in the last 168 hours.  CBC: Recent Labs  Lab 08/03/18 0824 08/05/18 0829 08/06/18 2014 08/07/18 0340 08/08/18 0211  WBC 5.5 4.6 12.8* 13.0* 8.8  NEUTROABS 2.8 2.7 10.6*  --  5.6  HGB 5.9* 8.1* 8.6* 8.7* 7.5*  HCT 17.5* 24.1* 26.5* 27.2* 23.4*  MCV 109.4* 103.0* 105.6* 107.5* 106.4*  PLT 39* 45* 58* 49* 49*    Cardiac Enzymes: Recent Labs  Lab 08/06/18 2014 08/07/18 0340 08/07/18 0832 08/07/18 1507  TROPONINI 0.06* 1.10* 0.81* 0.35*    BNP: Invalid input(s): POCBNP  CBG: Recent Labs  Lab 08/07/18 0244  GLUCAP 111*    Microbiology: Results for orders placed or performed during the hospital encounter of 08/06/18  SARS Coronavirus 2 (CEPHEID - Performed in [place] hospital lab), Hosp Order     Status: None   Collection Time: 08/06/18  8:15 PM  Result Value Ref Range Status   SARS Coronavirus 2 NEGATIVE NEGATIVE Final    Comment: (NOTE) If result is NEGATIVE SARS-CoV-2 target nucleic acids are NOT DETECTED. The SARS-CoV-2 RNA is generally detectable in upper and lower  respiratory specimens during the acute phase of infection. The lowest  concentration of SARS-CoV-2 viral copies this assay can detect is 250  copies / mL. A negative result does not preclude SARS-CoV-2 infection  and should not be used as the sole basis for treatment  or other  patient management decisions.  A negative result may occur with  improper specimen collection / handling, submission of specimen other  than nasopharyngeal swab, presence of viral mutation(s) within the  areas targeted by this assay, and inadequate number of viral copies  (<250 copies / mL). A negative result must be combined with clinical  observations, patient history, and epidemiological information. If result is POSITIVE SARS-CoV-2 target nucleic acids are DETECTED. The SARS-CoV-2 RNA is generally detectable in upper and lower  respiratory specimens dur ing the acute phase of infection.  Positive  results  are indicative of active infection with SARS-CoV-2.  Clinical  correlation with patient history and other diagnostic information is  necessary to determine patient infection status.  Positive results do  not rule out bacterial infection or co-infection with other viruses. If result is PRESUMPTIVE POSTIVE SARS-CoV-2 nucleic acids MAY BE PRESENT.   A presumptive positive result was obtained on the submitted specimen  and confirmed on repeat testing.  While 2019 novel coronavirus  (SARS-CoV-2) nucleic acids may be present in the submitted sample  additional confirmatory testing may be necessary for epidemiological  and / or clinical management purposes  to differentiate between  SARS-CoV-2 and other Sarbecovirus currently known to infect humans.  If clinically indicated additional testing with an alternate test  methodology [phone]) is advised. The SARS-CoV-2 RNA is generally  detectable in upper and lower respiratory sp ecimens during the acute  phase of infection. The expected result is Negative. Fact Sheet for Patients:  StrictlyIdeas.no Fact Sheet for Healthcare Providers: BankingDealers.co.za This test is not yet approved or cleared by the [place] FDA and has been authorized for detection and/or diagnosis of SARS-CoV-2 by FDA under an Emergency Use Authorization (EUA).  This EUA will remain in effect (meaning this test can be used) for the duration of the COVID-19 declaration under Section 564(b)(1) of the Act, 21 U.S.C. section 360bbb-3(b)(1), unless the authorization is terminated or revoked sooner. Performed at [facility], [address] [place], [place] [address]   MRSA PCR Screening     Status: None   Collection Time: 08/07/18  2:38 AM  Result Value Ref Range Status   MRSA by PCR NEGATIVE NEGATIVE Final    Comment:        The GeneXpert MRSA Assay (FDA approved for NASAL specimens only), is one component of  a comprehensive MRSA colonization surveillance program. It is not intended to diagnose MRSA infection nor to guide or monitor treatment for MRSA infections. Performed at [facility], [address] [place], [place] [address]   CULTURE, BLOOD (ROUTINE X 2) w Reflex to ID Panel     Status: None (Preliminary result)   Collection Time: 08/07/18  3:40 AM  Result Value Ref Range Status   Specimen Description BLOOD LEFT ANTECUBITAL  Final   Special Requests   Final    BOTTLES DRAWN AEROBIC AND ANAEROBIC Blood Culture results may not be optimal due to an excessive volume of blood received in culture bottles   Culture   Final    NO GROWTH 1 DAY Performed at [facility], [address], [place], [place] [address]    Report Status PENDING  Incomplete  CULTURE, BLOOD (ROUTINE X 2) w Reflex to ID Panel     Status: None (Preliminary result)   Collection Time: 08/07/18  3:40 AM  Result Value Ref Range Status   Specimen Description BLOOD BLOOD LEFT HAND  Final   Special Requests  Final    BOTTLES DRAWN AEROBIC AND ANAEROBIC Blood Culture results may not be optimal due to an excessive volume of blood received in culture bottles   Culture   Final    NO GROWTH 1 DAY Performed at [facility], [address] [place], [place] [address]    Report Status PENDING  Incomplete    Coagulation Studies: No results for input(s): LABPROT, INR in the last 72 hours.  Urinalysis: Recent Labs    08/08/18 0212  COLORURINE YELLOW*  LABSPEC 1.012  PHURINE 5.0  GLUCOSEU NEGATIVE  HGBUR LARGE*  BILIRUBINUR NEGATIVE  KETONESUR NEGATIVE  PROTEINUR 30*  NITRITE NEGATIVE  LEUKOCYTESUR NEGATIVE      Imaging: US Venous Img Lower Bilateral  Result Date: 08/07/2018 CLINICAL DATA:  Elevated D-dimer. History of ovarian cancer. Evaluate for DVT. EXAM: BILATERAL LOWER EXTREMITY VENOUS DOPPLER ULTRASOUND TECHNIQUE: Gray-scale sonography with graded compression, as well as color  Doppler and duplex ultrasound were performed to evaluate the lower extremity deep venous systems from the level of the common femoral vein and including the common femoral, femoral, profunda femoral, popliteal and calf veins including the posterior tibial, peroneal and gastrocnemius veins when visible. The superficial great saphenous vein was also interrogated. Spectral Doppler was utilized to evaluate flow at rest and with distal augmentation maneuvers in the common femoral, femoral and popliteal veins. COMPARISON:  None. FINDINGS: RIGHT LOWER EXTREMITY Common Femoral Vein: No evidence of thrombus. Normal compressibility, respiratory phasicity and response to augmentation. Saphenofemoral Junction: No evidence of thrombus. Normal compressibility and flow on color Doppler imaging. Profunda Femoral Vein: No evidence of thrombus. Normal compressibility and flow on color Doppler imaging. Femoral Vein: No evidence of thrombus. Normal compressibility, respiratory phasicity and response to augmentation. Popliteal Vein: No evidence of thrombus. Normal compressibility, respiratory phasicity and response to augmentation. Calf Veins: No evidence of thrombus. Normal compressibility and flow on color Doppler imaging. Superficial Great Saphenous Vein: No evidence of thrombus. Normal compressibility. Venous Reflux:  None. Other Findings:  None. LEFT LOWER EXTREMITY Common Femoral Vein: No evidence of thrombus. Normal compressibility, respiratory phasicity and response to augmentation. Saphenofemoral Junction: No evidence of thrombus. Normal compressibility and flow on color Doppler imaging. Profunda Femoral Vein: No evidence of thrombus. Normal compressibility and flow on color Doppler imaging. Femoral Vein: No evidence of thrombus. Normal compressibility, respiratory phasicity and response to augmentation. Popliteal Vein: No evidence of thrombus. Normal compressibility, respiratory phasicity and response to augmentation. Calf  Veins: No evidence of thrombus. Normal compressibility and flow on color Doppler imaging. Superficial Great Saphenous Vein: No evidence of thrombus. Normal compressibility. Venous Reflux:  None. Other Findings: Note made of an approximately 2.3 x 3.6 x 0.8 cm serpiginous fluid collection with the left popliteal fossa. IMPRESSION: 1. No evidence of DVT within either lower extremity. 2. Incidentally noted approximately 3.6 cm left-sided Baker's cyst. Electronically Signed   By: Sandi Mariscal M.D.   On: 08/07/2018 07:49   Dg Chest Port 1 View  Result Date: 08/07/2018 CLINICAL DATA:  Acute CHF EXAM: PORTABLE CHEST 1 VIEW COMPARISON:  Yesterday FINDINGS: Cardiomegaly and vascular pedicle widening. Continued perihilar predominant interstitial airspace opacity. No evidence of pleural effusion or pneumothorax. Port on the right with tip at the SVC. IMPRESSION: Continued CHF pattern. Electronically Signed   By: Monte Fantasia M.D.   On: 08/07/2018 06:15   Dg Chest Portable 1 View  Result Date: 08/06/2018 CLINICAL DATA:  Shortness of breath EXAM: PORTABLE CHEST 1 VIEW COMPARISON:  08/06/2018, 06/29/2018, PET-CT  05/07/2018 FINDINGS: Surgical hardware in the cervical spine. Right-sided central venous port tip over the SVC. Cardiomegaly with vascular congestion. Diffuse interstitial opacity, no significant change as compared with radiograph performed earlier today. No pleural effusion or pneumothorax. IMPRESSION: No significant interval change in cardiomegaly, mild vascular congestion and diffuse interstitial opacity since radiograph performed earlier today. Electronically Signed   By: Donavan Foil M.D.   On: 08/06/2018 23:40   Dg Chest Portable 1 View  Result Date: 08/06/2018 CLINICAL DATA:  63 year old female with respiratory distress, shortness of breath. Recent weight gain. Hypertensive on presentation. Pending COVID-19 status. History of ovarian cancer. EXAM: PORTABLE CHEST 1 VIEW COMPARISON:  06/29/2018 and  earlier. FINDINGS: Portable AP semi upright view at 2001 hours. Stable right chest power port. Stable cardiac size and mediastinal contours. Lower lung volumes with ongoing diffuse pulmonary interstitial opacity, basilar predominance. This is similar to but increased compared to April. No superimposed pneumothorax, pleural effusion or consolidation. Prior cervical ACDF. No acute osseous abnormality identified. IMPRESSION: Lower lung volumes with ongoing diffuse pulmonary interstitial opacity, similar to but increased compared to April. Differential considerations include viral/atypical respiratory infection, pulmonary edema, less likely lymphangitic carcinomatosis. Electronically Signed   By: Genevie Ann M.D.   On: 08/06/2018 20:28   Ct Renal Stone Study  Result Date: 08/06/2018 CLINICAL DATA:  History of ovarian cancer. Congestive heart failure. Decreasing GFR. Evaluate for renal obstruction. EXAM: CT ABDOMEN AND PELVIS WITHOUT CONTRAST TECHNIQUE: Multidetector CT imaging of the abdomen and pelvis was performed following the standard protocol without IV contrast. COMPARISON:  05/07/2018 PET-CT. 02/02/2018 CT abdomen and pelvis. FINDINGS: Lower chest: Interlobular septal thickening and hazy central ground-glass opacifications of the lung bases. Small bilateral pleural effusions. Mild cardiomegaly. Hepatobiliary: No focal liver abnormality is seen. Status post cholecystectomy. No biliary dilatation. Pancreas: Unremarkable. No pancreatic ductal dilatation or surrounding inflammatory changes. Spleen: Normal in size without focal abnormality. Adrenals/Urinary Tract: Adrenal glands are unremarkable. Kidneys are normal, without renal calculi, focal lesion, or hydronephrosis. Bladder is unremarkable. Stomach/Bowel: Stomach is within normal limits. Appendix appears normal. No evidence of bowel wall thickening, distention, or inflammatory changes. Vascular/Lymphatic: Aortic atherosclerosis. No enlarged abdominal or pelvic  lymph nodes. Reproductive: Status post hysterectomy. No adnexal masses. Other: Supraumbilical hernia containing fat is mildly increased in size. Stable postsurgical changes within the ventral abdominal wall. Nodularity of the left upper quadrant omentum is stable from prior PET-CT (series 2, image 33). Small volume of ascites. Mild edema within the subcutaneous soft tissues. Musculoskeletal: No fracture is seen. L4-5 posterior instrumented fusion and laminectomy chronic postsurgical changes. No apparent hardware related complication. IMPRESSION: 1. No hydronephrosis or urinary stone disease identified. Unremarkable non-contrast CT appearance of the kidneys. 2. Interstitial and alveolar pulmonary edema, small bilateral pleural effusions, small volume of ascites, mild edema within subcutaneous soft tissues. 3. Stable nodularity of the left upper quadrant omentum from prior PET-CT. Electronically Signed   By: Kristine Garbe M.D.   On: 08/06/2018 22:31     Medications:   . sodium chloride     . DULoxetine  60 mg Oral Daily  . ferrous sulfate  325 mg Oral Daily  . furosemide  40 mg Intravenous BID  . gabapentin  300 mg Oral TID  . levothyroxine  25 mcg Oral QAC breakfast  . metoprolol succinate  25 mg Oral Daily  . mometasone-formoterol  2 puff Inhalation BID  . sodium chloride flush  3 mL Intravenous Q12H   sodium chloride, acetaminophen, cyclobenzaprine, fluticasone, hydrALAZINE, ipratropium-albuterol, labetalol,  lidocaine, morphine injection, ondansetron (ZOFRAN) IV, oxyCODONE-acetaminophen **AND** oxyCODONE, sodium chloride flush, temazepam  Assessment/ Plan:  63 y.o. female with a PMHx of ovarian cancer stage III, chronic kidney disease stage III baseline creatinine 1.7 with EGFR of 30, GERD, migraine headaches, cervical spinal stenosis, who was admitted to [facility] on 08/06/2018 for evaluation of significant shortness of breath.  1.  Acute renal failure/chronic kidney disease stage III  baseline creatinine 1.7/proteinuria.  Patient's baseline creatinine appears to be 1.7.  Creatinine currently significantly worse.  Could be related to cardiorenal syndrome.   -Patient seen at bedside.  EGFR remains quite low at 13.  Urine output 975 cc over the preceding 24 hours.  No urgent indication to initiate dialysis however this remains a possibility and we discussed this in depth with the patient today.  She is willing to undergo dialysis if deemed necessary.  2.  Acute systolic heart failure.  Symptoms have improved.  Continue furosemide 40 mg IV twice daily.   LOS: 2 Jermell Holeman 5/23/202012:56 PM

## 2018-08-08 NOTE — Progress Notes (Signed)
[facility] [place] NAME: Jessica Burgess    MR#:  [id number]  DATE OF BIRTH:  Jul 18, 1955  SUBJECTIVE:   Patient feels a lot better. She is off BiPAP and oxygen. No chest pain. REVIEW OF SYSTEMS:   Review of Systems  Constitutional: Negative for chills, fever and weight loss.  HENT: Negative for ear discharge, ear pain and nosebleeds.   Eyes: Negative for blurred vision, pain and discharge.  Respiratory: Positive for shortness of breath. Negative for sputum production, wheezing and stridor.   Cardiovascular: Negative for chest pain, palpitations, orthopnea and PND.  Gastrointestinal: Negative for abdominal pain, diarrhea, nausea and vomiting.  Genitourinary: Negative for frequency and urgency.  Musculoskeletal: Negative for back pain and joint pain.  Neurological: Positive for weakness. Negative for sensory change, speech change and focal weakness.  Psychiatric/Behavioral: Negative for depression and hallucinations. The patient is not nervous/anxious.    Tolerating Diet:yes Tolerating PT: ambulatory  DRUG ALLERGIES:  No Known Allergies  VITALS:  Blood pressure (!) 158/95, pulse 81, temperature 98 F (36.7 C), temperature source Oral, resp. rate (!) 24, height 5\' 2"  (1.575 m), weight 77.8 kg, SpO2 98 %.  PHYSICAL EXAMINATION:   Physical Exam  GENERAL:  63 y.o.-year-old patient lying in the bed with no acute distress.  EYES: Pupils equal, round, reactive to light and accommodation. No scleral icterus. Extraocular muscles intact.  HEENT: Head atraumatic, normocephalic. Oropharynx and nasopharynx clear.  NECK:  Supple, no jugular venous distention. No thyroid enlargement, no tenderness.  LUNGS: Normal breath sounds bilaterally, no wheezing, few rales,no rhonchi. No use of accessory muscles of respiration.  CARDIOVASCULAR: S1, S2 normal. No murmurs, rubs, or gallops.  ABDOMEN: Soft, nontender, nondistended. Bowel sounds present.  No organomegaly or mass.  EXTREMITIES: No cyanosis, clubbing or edema b/l.    NEUROLOGIC: Cranial nerves II through XII are intact. No focal Motor or sensory deficits b/l.   PSYCHIATRIC:  patient is alert and oriented x 3.  SKIN: No obvious rash, lesion, or ulcer.   LABORATORY PANEL:  CBC Recent Labs  Lab 08/08/18 0211  WBC 8.8  HGB 7.5*  HCT 23.4*  PLT 49*    Chemistries  Recent Labs  Lab 08/06/18 2014  08/08/18 0211  NA 138   < > 137  K 5.5*   < > 5.1  CL 107   < > 104  CO2 20*   < > 22  GLUCOSE 148*   < > 100*  BUN 69*   < > 79*  CREATININE 3.19*   < > 3.50*  CALCIUM 8.3*   < > 8.0*  AST 49*  --   --   ALT 22  --   --   ALKPHOS 66  --   --   BILITOT 1.2  --   --    < > = values in this interval not displayed.   Cardiac Enzymes Recent Labs  Lab 08/07/18 1507  TROPONINI 0.35*   RADIOLOGY:  US Venous Img Lower Bilateral  Result Date: 08/07/2018 CLINICAL DATA:  Elevated D-dimer. History of ovarian cancer. Evaluate for DVT. EXAM: BILATERAL LOWER EXTREMITY VENOUS DOPPLER ULTRASOUND TECHNIQUE: Gray-scale sonography with graded compression, as well as color Doppler and duplex ultrasound were performed to evaluate the lower extremity deep venous systems from the level of the common femoral vein and including the common femoral, femoral, profunda femoral, popliteal and calf veins including the posterior tibial, peroneal and gastrocnemius veins when visible. The  superficial great saphenous vein was also interrogated. Spectral Doppler was utilized to evaluate flow at rest and with distal augmentation maneuvers in the common femoral, femoral and popliteal veins. COMPARISON:  None. FINDINGS: RIGHT LOWER EXTREMITY Common Femoral Vein: No evidence of thrombus. Normal compressibility, respiratory phasicity and response to augmentation. Saphenofemoral Junction: No evidence of thrombus. Normal compressibility and flow on color Doppler imaging. Profunda Femoral Vein: No evidence of  thrombus. Normal compressibility and flow on color Doppler imaging. Femoral Vein: No evidence of thrombus. Normal compressibility, respiratory phasicity and response to augmentation. Popliteal Vein: No evidence of thrombus. Normal compressibility, respiratory phasicity and response to augmentation. Calf Veins: No evidence of thrombus. Normal compressibility and flow on color Doppler imaging. Superficial Great Saphenous Vein: No evidence of thrombus. Normal compressibility. Venous Reflux:  None. Other Findings:  None. LEFT LOWER EXTREMITY Common Femoral Vein: No evidence of thrombus. Normal compressibility, respiratory phasicity and response to augmentation. Saphenofemoral Junction: No evidence of thrombus. Normal compressibility and flow on color Doppler imaging. Profunda Femoral Vein: No evidence of thrombus. Normal compressibility and flow on color Doppler imaging. Femoral Vein: No evidence of thrombus. Normal compressibility, respiratory phasicity and response to augmentation. Popliteal Vein: No evidence of thrombus. Normal compressibility, respiratory phasicity and response to augmentation. Calf Veins: No evidence of thrombus. Normal compressibility and flow on color Doppler imaging. Superficial Great Saphenous Vein: No evidence of thrombus. Normal compressibility. Venous Reflux:  None. Other Findings: Note made of an approximately 2.3 x 3.6 x 0.8 cm serpiginous fluid collection with the left popliteal fossa. IMPRESSION: 1. No evidence of DVT within either lower extremity. 2. Incidentally noted approximately 3.6 cm left-sided Baker's cyst. Electronically Signed   By: Sandi Mariscal M.D.   On: 08/07/2018 07:49   Dg Chest Port 1 View  Result Date: 08/07/2018 CLINICAL DATA:  Acute CHF EXAM: PORTABLE CHEST 1 VIEW COMPARISON:  Yesterday FINDINGS: Cardiomegaly and vascular pedicle widening. Continued perihilar predominant interstitial airspace opacity. No evidence of pleural effusion or pneumothorax. Port on the  right with tip at the SVC. IMPRESSION: Continued CHF pattern. Electronically Signed   By: Monte Fantasia M.D.   On: 08/07/2018 06:15   Dg Chest Portable 1 View  Result Date: 08/06/2018 CLINICAL DATA:  Shortness of breath EXAM: PORTABLE CHEST 1 VIEW COMPARISON:  08/06/2018, 06/29/2018, PET-CT 05/07/2018 FINDINGS: Surgical hardware in the cervical spine. Right-sided central venous port tip over the SVC. Cardiomegaly with vascular congestion. Diffuse interstitial opacity, no significant change as compared with radiograph performed earlier today. No pleural effusion or pneumothorax. IMPRESSION: No significant interval change in cardiomegaly, mild vascular congestion and diffuse interstitial opacity since radiograph performed earlier today. Electronically Signed   By: Donavan Foil M.D.   On: 08/06/2018 23:40   Dg Chest Portable 1 View  Result Date: 08/06/2018 CLINICAL DATA:  63 year old female with respiratory distress, shortness of breath. Recent weight gain. Hypertensive on presentation. Pending COVID-19 status. History of ovarian cancer. EXAM: PORTABLE CHEST 1 VIEW COMPARISON:  06/29/2018 and earlier. FINDINGS: Portable AP semi upright view at 2001 hours. Stable right chest power port. Stable cardiac size and mediastinal contours. Lower lung volumes with ongoing diffuse pulmonary interstitial opacity, basilar predominance. This is similar to but increased compared to April. No superimposed pneumothorax, pleural effusion or consolidation. Prior cervical ACDF. No acute osseous abnormality identified. IMPRESSION: Lower lung volumes with ongoing diffuse pulmonary interstitial opacity, similar to but increased compared to April. Differential considerations include viral/atypical respiratory infection, pulmonary edema, less likely lymphangitic carcinomatosis. Electronically Signed  By: Genevie Ann M.D.   On: 08/06/2018 20:28   Ct Renal Stone Study  Result Date: 08/06/2018 CLINICAL DATA:  History of ovarian  cancer. Congestive heart failure. Decreasing GFR. Evaluate for renal obstruction. EXAM: CT ABDOMEN AND PELVIS WITHOUT CONTRAST TECHNIQUE: Multidetector CT imaging of the abdomen and pelvis was performed following the standard protocol without IV contrast. COMPARISON:  05/07/2018 PET-CT. 02/02/2018 CT abdomen and pelvis. FINDINGS: Lower chest: Interlobular septal thickening and hazy central ground-glass opacifications of the lung bases. Small bilateral pleural effusions. Mild cardiomegaly. Hepatobiliary: No focal liver abnormality is seen. Status post cholecystectomy. No biliary dilatation. Pancreas: Unremarkable. No pancreatic ductal dilatation or surrounding inflammatory changes. Spleen: Normal in size without focal abnormality. Adrenals/Urinary Tract: Adrenal glands are unremarkable. Kidneys are normal, without renal calculi, focal lesion, or hydronephrosis. Bladder is unremarkable. Stomach/Bowel: Stomach is within normal limits. Appendix appears normal. No evidence of bowel wall thickening, distention, or inflammatory changes. Vascular/Lymphatic: Aortic atherosclerosis. No enlarged abdominal or pelvic lymph nodes. Reproductive: Status post hysterectomy. No adnexal masses. Other: Supraumbilical hernia containing fat is mildly increased in size. Stable postsurgical changes within the ventral abdominal wall. Nodularity of the left upper quadrant omentum is stable from prior PET-CT (series 2, image 33). Small volume of ascites. Mild edema within the subcutaneous soft tissues. Musculoskeletal: No fracture is seen. L4-5 posterior instrumented fusion and laminectomy chronic postsurgical changes. No apparent hardware related complication. IMPRESSION: 1. No hydronephrosis or urinary stone disease identified. Unremarkable non-contrast CT appearance of the kidneys. 2. Interstitial and alveolar pulmonary edema, small bilateral pleural effusions, small volume of ascites, mild edema within subcutaneous soft tissues. 3. Stable  nodularity of the left upper quadrant omentum from prior PET-CT. Electronically Signed   By: Kristine Garbe M.D.   On: 08/06/2018 22:31   ASSESSMENT AND PLAN:   Adonica Fukushima  is a 63 y.o. female with a known history of ovarian cancer, CKD, hypothyroidism, hypertension and anemia.  Patient is currently undergoing chemotherapy followed by Dr. Mellody Drown and Dr. Grayland Ormond. Presented to the emergency room with a 2-day history of increased shortness of breath and nonproductive cough.  Shortness of breath is made worse when lying flat.    1.  Acute diastolic CHF-BNP 6333 - Patient started on Lasix 40 mg IV twice daily--now on daily lasix IV 40 mg - echocardiogram  left ventricle has mildly reduced systolic function, with an ejection fraction of 45-50%. The cavity size was normal. There is mildly increased left ventricular wall thickness. Concern for anterior/anteroseptal wall hypokinesis - Placed on BiPAP therapy for increased work of breathing--now on RA sats 96% - appreciate cardiology input from Dr Rockey Situ -?v/q scan  -added hydralazine and imdur per cardiology recommendation for after load reduction  2.  Acute on chronic renal failure-III - We will continue to monitor renal function closely particularly given initiation of diuretics for acute CHF -?cardiorenal syndrome -creat baslien 1.7 -creat 3.20--3.19--3.4  3.  Hyperkalemia - Potassium level is 5.5. - Will hold potassium gluconate and repeat potassium level in the a.m. as patient is receiving IV diuretic therapy. - Patient is on telemetry monitoring in the stepdown ICU  4.  High-grade ovarian cancer - Patient is status post chemotherapy with gemcitabine - seen by  Dr. Grayland Ormond   5.  Lactic acidosis- 3.4 lactic acid level--3.1--2.0 - Will repeat lactic acid level as patient is diuresed.  No other evidence of infectious process.  Blood, urine, and sputum cultures are pending.  Will treat as indicated  6.  Hypothyroidism - Synthroid continued  7. Anemia of chronic dz -recently received 2 units BT at the cancer center  8. TCP suspected due to chemo stable at 49K  DVT and PPI prophylaxis have been initiated.    CODE STATUS: full  DVT Prophylaxis: heparin  TOTAL TIME TAKING CARE OF THIS PATIENT: *30* minutes.  >50% time spent on counselling and coordination of care  POSSIBLE D/C IN *1-2* DAYS, DEPENDING ON CLINICAL CONDITION.  Note: This dictation was prepared with Dragon dictation along with smaller phrase technology. Any transcriptional errors that result from this process are unintentional.  Fritzi Mandes M.D on 08/08/2018 at 2:40 PM  Between 7am to 6pm - Pager - [phone]  After 6pm go to [email or web address] - password [email or web address] [facility]  Office  [phone]  CC: Primary care physician; Steele Sizer, MDPatient ID: [name], female   DOB: 1956/02/04, 63 y.o.   MRN: [id number]

## 2018-08-08 NOTE — Progress Notes (Signed)
Patient ID: Jessica Burgess, female   DOB: [birth year], 63 y.o.   MRN: [id number]     PCP-Cardiologist: No primary care provider on file.   Subjective:    Breathing better today, no chest pain.  BUN/creatinine fairly stable.    CXR (3/22) with CHF pattern  Echo: EF 40-45%, possible anterior/anteroseptal hypokinesis, normal RV  Objective:   Weight Range: 77.8 kg Body mass index is 31.37 kg/m.   Vital Signs:   Temp:  [98.1 F (36.7 C)-99.5 F (37.5 C)] 98.4 F (36.9 C) (05/23 0800) Pulse Rate:  [81-105] 87 (05/23 1021) Resp:  [13-24] 13 (05/23 0600) BP: (133-168)/(77-98) 157/90 (05/23 1021) SpO2:  [94 %-100 %] 95 % (05/23 0600) Weight:  [77.8 kg] 77.8 kg (05/23 0422) Last BM Date: 08/06/18  Weight change: Filed Weights   08/06/18 2008 08/07/18 0240 08/08/18 0422  Weight: 79.4 kg 78.3 kg 77.8 kg    Intake/Output:   Intake/Output Summary (Last 24 hours) at 08/08/2018 1031 Last data filed at 08/08/2018 1030 Gross per 24 hour  Intake 353 ml  Output 700 ml  Net -347 ml      Physical Exam    General:  Well appearing. No resp difficulty HEENT: Normal Neck: Supple. JVP 12 cm. Carotids 2+ bilat; no bruits. No lymphadenopathy or thyromegaly appreciated. Cor: PMI nondisplaced. Regular rate & rhythm. No rubs, gallops or murmurs. Lungs: Clear Abdomen: Soft, nontender, nondistended. No hepatosplenomegaly. No bruits or masses. Good bowel sounds. Extremities: No cyanosis, clubbing, rash, edema Neuro: Alert & orientedx3, cranial nerves grossly intact. moves all 4 extremities w/o difficulty. Affect pleasant   Telemetry   NSR 80s, personally reviewed  Labs    CBC Recent Labs    08/06/18 2014 08/07/18 0340 08/08/18 0211  WBC 12.8* 13.0* 8.8  NEUTROABS 10.6*  --  5.6  HGB 8.6* 8.7* 7.5*  HCT 26.5* 27.2* 23.4*  MCV 105.6* 107.5* 106.4*  PLT 58* 49* 49*   Basic Metabolic Panel Recent Labs    08/07/18 0340 08/08/18 0211  NA 139 137  K 5.5* 5.1  CL 107 104   CO2 21* 22  GLUCOSE 119* 100*  BUN 73* 79*  CREATININE 3.42* 3.50*  CALCIUM 8.4* 8.0*   Liver Function Tests Recent Labs    08/06/18 2014  AST 49*  ALT 22  ALKPHOS 66  BILITOT 1.2  PROT 6.5  ALBUMIN 3.3*   No results for input(s): LIPASE, AMYLASE in the last 72 hours. Cardiac Enzymes Recent Labs    08/07/18 0340 08/07/18 0832 08/07/18 1507  TROPONINI 1.10* 0.81* 0.35*    BNP: BNP (last 3 results) Recent Labs    08/06/18 2015 08/07/18 0340  BNP 1,407.0* 2,727.0*    ProBNP (last 3 results) No results for input(s): PROBNP in the last 8760 hours.   D-Dimer No results for input(s): DDIMER in the last 72 hours. Hemoglobin A1C No results for input(s): HGBA1C in the last 72 hours. Fasting Lipid Panel No results for input(s): CHOL, HDL, LDLCALC, TRIG, CHOLHDL, LDLDIRECT in the last 72 hours. Thyroid Function Tests No results for input(s): TSH, T4TOTAL, T3FREE, THYROIDAB in the last 72 hours.  Invalid input(s): FREET3  Other results:   Imaging     No results found.   Medications:     Scheduled Medications: . DULoxetine  60 mg Oral Daily  . ferrous sulfate  325 mg Oral Daily  . furosemide  40 mg Intravenous Daily  . gabapentin  300 mg Oral TID  . levothyroxine  25  mcg Oral QAC breakfast  . metoprolol succinate  25 mg Oral Daily  . mometasone-formoterol  2 puff Inhalation BID  . sodium chloride flush  3 mL Intravenous Q12H     Infusions: . sodium chloride       PRN Medications:  sodium chloride, acetaminophen, cyclobenzaprine, fluticasone, hydrALAZINE, ipratropium-albuterol, labetalol, lidocaine, morphine injection, ondansetron (ZOFRAN) IV, oxyCODONE-acetaminophen **AND** oxyCODONE, sodium chloride flush, temazepam    Assessment/Plan   1. Acute systolic CHF: Echo with EF 40-45%, anterior/anteroseptal HK, normal RV.  Cause of cardiomyopathy uncertain, but with possible regional wall motion abnormalities, CAD remains a concern.  Creatinine  is stably elevated at 3.5 today.  She feels better but is still volume overloaded with JVD on exam.  - Lasix 40 mg IV bid today and follow I/Os closely.  - BP remains high, add afterload reduction with hydralazine 12.5 mg tid + Imdur 30 daily, can titrate up as needed.  - Continue Toprol XL 25 mg daily.  2. AKI on CKD stage 3: Uncertain etiology.  Creatinine stable today at 3.5, follow renal indices closely with diuresis.  3. Anemia/thrombocytopenia: Post-chemo.   4. Elevated troponin: TnI peak 1.1, now trending down.  No chest pain.  ECG ok but noted to have possible anterior and anteroseptal wall motion abnormalities, possible underlying CAD.  - Not candidate at this point for cath with AKI, anemia, thrombocytopenia.  No ASA yet with low platelets but can add statin.  - Likely would aim for Cardiolite, can arrange as outpatient.  5. Ovarian cancer: Per oncology.    Length of Stay: 2  Loralie Champagne, MD  08/08/2018, 10:31 AM  Advanced Heart Failure Team Pager [phone] (M-F; 7a - 4p)  Please contact [facility] for night-coverage after hours (4p -7a ) and weekends on [email or web address]

## 2018-08-09 ENCOUNTER — Inpatient Hospital Stay: Payer: PPO

## 2018-08-09 LAB — URINE CULTURE
Culture: NO GROWTH
Special Requests: NORMAL

## 2018-08-09 LAB — BASIC METABOLIC PANEL
Anion gap: 10 (ref 5–15)
BUN: 89 mg/dL — ABNORMAL HIGH (ref 8–23)
CO2: 23 mmol/L (ref 22–32)
Calcium: 8.2 mg/dL — ABNORMAL LOW (ref 8.9–10.3)
Chloride: 104 mmol/L (ref 98–111)
Creatinine, Ser: 3.76 mg/dL — ABNORMAL HIGH (ref 0.44–1.00)
GFR calc Af Amer: 14 mL/min — ABNORMAL LOW (ref >60)
GFR calc non Af Amer: 12 mL/min — ABNORMAL LOW (ref >60)
Glucose, Bld: 106 mg/dL — ABNORMAL HIGH (ref 70–99)
Potassium: 5 mmol/L (ref 3.5–5.1)
Sodium: 137 mmol/L (ref 135–145)

## 2018-08-09 MED ORDER — SODIUM CHLORIDE 0.9% FLUSH
10.0000 mL | Freq: Two times a day (BID) | INTRAVENOUS | Status: DC
  Administered 2018-08-09 – 2018-08-18 (×16): 10 mL via INTRAVENOUS

## 2018-08-09 MED ORDER — SUMATRIPTAN SUCCINATE 50 MG PO TABS
100.0000 mg | ORAL_TABLET | Freq: Once | ORAL | Status: AC
  Administered 2018-08-09: 100 mg via ORAL
  Filled 2018-08-09: qty 2

## 2018-08-09 MED ORDER — SUMATRIPTAN SUCCINATE 50 MG PO TABS
100.0000 mg | ORAL_TABLET | Freq: Once | ORAL | Status: AC
  Administered 2018-08-09: 100 mg via ORAL
  Filled 2018-08-09 (×2): qty 2

## 2018-08-09 MED ORDER — HYDRALAZINE HCL 20 MG/ML IJ SOLN
10.0000 mg | INTRAMUSCULAR | Status: DC | PRN
  Administered 2018-08-09 – 2018-08-12 (×3): 10 mg via INTRAVENOUS
  Filled 2018-08-09 (×3): qty 1

## 2018-08-09 MED ORDER — HYDRALAZINE HCL 25 MG PO TABS
25.0000 mg | ORAL_TABLET | Freq: Three times a day (TID) | ORAL | Status: DC
  Administered 2018-08-09 – 2018-08-18 (×23): 25 mg via ORAL
  Filled 2018-08-09 (×23): qty 1

## 2018-08-09 MED ORDER — ATORVASTATIN CALCIUM 20 MG PO TABS
40.0000 mg | ORAL_TABLET | Freq: Every day | ORAL | Status: DC
  Administered 2018-08-09 – 2018-08-17 (×8): 40 mg via ORAL
  Filled 2018-08-09 (×8): qty 2

## 2018-08-09 NOTE — Plan of Care (Signed)
  Problem: Education: Goal: Knowledge of General Education information will improve Description Including pain rating scale, medication(s)/side effects and non-pharmacologic comfort measures Outcome: Progressing   Problem: Clinical Measurements: Goal: Respiratory complications will improve Outcome: Progressing Goal: Cardiovascular complication will be avoided Outcome: Progressing   Problem: Activity: Goal: Capacity to carry out activities will improve Outcome: Progressing

## 2018-08-09 NOTE — Progress Notes (Signed)
[facility]  ROUNDING NOTE   Subjective:  Renal function worse today. BUN 89 with a creatinine of 3.76 and EGFR remains low at 12. She did diurese some with the Lasix. However when we reviewed labs earlier this a.m. and noted declining renal function we held Lasix.   Objective:  Vital signs in last 24 hours:  Temp:  [97.6 F (36.4 C)-98.5 F (36.9 C)] 98.3 F (36.8 C) (05/24 0814) Pulse Rate:  [77-97] 83 (05/24 0814) Resp:  [16-24] 16 (05/24 0814) BP: (145-191)/(81-119) 158/91 (05/24 0814) SpO2:  [94 %-98 %] 95 % (05/24 0814) Weight:  [76.8 kg-77.2 kg] 76.8 kg (05/24 0333)  Weight change: -0.598 kg Filed Weights   08/08/18 0422 08/08/18 1413 08/09/18 0333  Weight: 77.8 kg 77.2 kg 76.8 kg    Intake/Output: I/O last 3 completed shifts: In: 3 [I.V.:3] Out: 1050 [Urine:1050]   Intake/Output this shift:  Total I/O In: 120 [P.O.:120] Out: -   Physical Exam: General: No acute distress  Head: Normocephalic, atraumatic. Moist oral mucosal membranes  Eyes: Anicteric  Neck: Supple, trachea midline  Lungs:  Basilar rales, normal effort  Heart: S1S2 no rubs  Abdomen:  Soft, nontender, bowel sounds present  Extremities: 1+ peripheral edema.  Neurologic: Awake, alert, following commands  Skin: No lesions       Basic Metabolic Panel: Recent Labs  Lab 08/05/18 0829 08/06/18 2014 08/07/18 0340 08/08/18 0211 08/09/18 0531  NA 137 138 139 137 137  K 5.2* 5.5* 5.5* 5.1 5.0  CL 105 107 107 104 104  CO2 24 20* 21* 22 23  GLUCOSE 119* 148* 119* 100* 106*  BUN 59* 69* 73* 79* 89*  CREATININE 3.20* 3.19* 3.42* 3.50* 3.76*  CALCIUM 8.0* 8.3* 8.4* 8.0* 8.2*    Liver Function Tests: Recent Labs  Lab 08/03/18 0824 08/05/18 0829 08/06/18 2014  AST 38 32 49*  ALT '26 21 22  ' ALKPHOS 58 61 66  BILITOT 0.8 0.9 1.2  PROT 5.7* 5.7* 6.5  ALBUMIN 3.1* 2.9* 3.3*   No results for input(s): LIPASE, AMYLASE in the last 168 hours. No results for input(s): AMMONIA  in the last 168 hours.  CBC: Recent Labs  Lab 08/03/18 0824 08/05/18 0829 08/06/18 2014 08/07/18 0340 08/08/18 0211  WBC 5.5 4.6 12.8* 13.0* 8.8  NEUTROABS 2.8 2.7 10.6*  --  5.6  HGB 5.9* 8.1* 8.6* 8.7* 7.5*  HCT 17.5* 24.1* 26.5* 27.2* 23.4*  MCV 109.4* 103.0* 105.6* 107.5* 106.4*  PLT 39* 45* 58* 49* 49*    Cardiac Enzymes: Recent Labs  Lab 08/06/18 2014 08/07/18 0340 08/07/18 0832 08/07/18 1507  TROPONINI 0.06* 1.10* 0.81* 0.35*    BNP: Invalid input(s): POCBNP  CBG: Recent Labs  Lab 08/07/18 0244  GLUCAP 111*    Microbiology: Results for orders placed or performed during the hospital encounter of 08/06/18  SARS Coronavirus 2 (CEPHEID - Performed in [facility] hospital lab), Hosp Order     Status: None   Collection Time: 08/06/18  8:15 PM  Result Value Ref Range Status   SARS Coronavirus 2 NEGATIVE NEGATIVE Final    Comment: (NOTE) If result is NEGATIVE SARS-CoV-2 target nucleic acids are NOT DETECTED. The SARS-CoV-2 RNA is generally detectable in upper and lower  respiratory specimens during the acute phase of infection. The lowest  concentration of SARS-CoV-2 viral copies this assay can detect is 250  copies / mL. A negative result does not preclude SARS-CoV-2 infection  and should not be used as the sole  basis for treatment or other  patient management decisions.  A negative result may occur with  improper specimen collection / handling, submission of specimen other  than nasopharyngeal swab, presence of viral mutation(s) within the  areas targeted by this assay, and inadequate number of viral copies  (<250 copies / mL). A negative result must be combined with clinical  observations, patient history, and epidemiological information. If result is POSITIVE SARS-CoV-2 target nucleic acids are DETECTED. The SARS-CoV-2 RNA is generally detectable in upper and lower  respiratory specimens dur ing the acute phase of infection.  Positive  results are  indicative of active infection with SARS-CoV-2.  Clinical  correlation with patient history and other diagnostic information is  necessary to determine patient infection status.  Positive results do  not rule out bacterial infection or co-infection with other viruses. If result is PRESUMPTIVE POSTIVE SARS-CoV-2 nucleic acids MAY BE PRESENT.   A presumptive positive result was obtained on the submitted specimen  and confirmed on repeat testing.  While 2019 novel coronavirus  (SARS-CoV-2) nucleic acids may be present in the submitted sample  additional confirmatory testing may be necessary for epidemiological  and / or clinical management purposes  to differentiate between  SARS-CoV-2 and other Sarbecovirus currently known to infect humans.  If clinically indicated additional testing with an alternate test  methodology [phone]) is advised. The SARS-CoV-2 RNA is generally  detectable in upper and lower respiratory sp ecimens during the acute  phase of infection. The expected result is Negative. Fact Sheet for Patients:  StrictlyIdeas.no Fact Sheet for Healthcare Providers: BankingDealers.co.za This test is not yet approved or cleared by the [place] FDA and has been authorized for detection and/or diagnosis of SARS-CoV-2 by FDA under an Emergency Use Authorization (EUA).  This EUA will remain in effect (meaning this test can be used) for the duration of the COVID-19 declaration under Section 564(b)(1) of the Act, 21 U.S.C. section 360bbb-3(b)(1), unless the authorization is terminated or revoked sooner. Performed at [facility], [address] [place], [place] [address]   MRSA PCR Screening     Status: None   Collection Time: 08/07/18  2:38 AM  Result Value Ref Range Status   MRSA by PCR NEGATIVE NEGATIVE Final    Comment:        The GeneXpert MRSA Assay (FDA approved for NASAL specimens only), is one component of  a comprehensive MRSA colonization surveillance program. It is not intended to diagnose MRSA infection nor to guide or monitor treatment for MRSA infections. Performed at [facility], [address] [place], [place] [address]   CULTURE, BLOOD (ROUTINE X 2) w Reflex to ID Panel     Status: None (Preliminary result)   Collection Time: 08/07/18  3:40 AM  Result Value Ref Range Status   Specimen Description BLOOD LEFT ANTECUBITAL  Final   Special Requests   Final    BOTTLES DRAWN AEROBIC AND ANAEROBIC Blood Culture results may not be optimal due to an excessive volume of blood received in culture bottles   Culture   Final    NO GROWTH 2 DAYS Performed at [facility], [address], [place], [place] [address]    Report Status PENDING  Incomplete  CULTURE, BLOOD (ROUTINE X 2) w Reflex to ID Panel     Status: None (Preliminary result)   Collection Time: 08/07/18  3:40 AM  Result Value Ref Range Status   Specimen Description BLOOD BLOOD LEFT HAND  Final  Special Requests   Final    BOTTLES DRAWN AEROBIC AND ANAEROBIC Blood Culture results may not be optimal due to an excessive volume of blood received in culture bottles   Culture   Final    NO GROWTH 2 DAYS Performed at [facility], [address], [place], [place] [address]    Report Status PENDING  Incomplete  Urine Culture     Status: None   Collection Time: 08/08/18  2:10 AM  Result Value Ref Range Status   Specimen Description   Final    URINE, RANDOM Performed at [facility], [address], [place], [place] [address]    Special Requests   Final    Normal Performed at [facility], [address] [place], [place] [address]    Culture   Final    NO GROWTH Performed at [facility], [place] [address], [place],  [address]    Report Status 08/09/2018 FINAL  Final    Coagulation Studies: No results for input(s): LABPROT, INR in the last 72  hours.  Urinalysis: Recent Labs    08/08/18 0212  COLORURINE YELLOW*  LABSPEC 1.012  PHURINE 5.0  GLUCOSEU NEGATIVE  HGBUR LARGE*  BILIRUBINUR NEGATIVE  KETONESUR NEGATIVE  PROTEINUR 30*  NITRITE NEGATIVE  LEUKOCYTESUR NEGATIVE      Imaging: No results found.   Medications:   . sodium chloride     . atorvastatin  40 mg Oral q1800  . DULoxetine  60 mg Oral Daily  . ferrous sulfate  325 mg Oral Daily  . hydrALAZINE  25 mg Oral TID  . isosorbide mononitrate  30 mg Oral Daily  . levothyroxine  25 mcg Oral Q0600  . metoprolol succinate  25 mg Oral Daily  . mometasone-formoterol  2 puff Inhalation BID  . sodium chloride flush  10 mL Intravenous Q12H  . sodium chloride flush  3 mL Intravenous Q12H   sodium chloride, acetaminophen, cyclobenzaprine, fluticasone, ipratropium-albuterol, labetalol, lidocaine, ondansetron (ZOFRAN) IV, oxyCODONE-acetaminophen **AND** oxyCODONE, sodium chloride flush, temazepam  Assessment/ Plan:  63 y.o. female with a PMHx of ovarian cancer stage III, chronic kidney disease stage III baseline creatinine 1.7 with EGFR of 30, GERD, migraine headaches, cervical spinal stenosis, who was admitted to [facility] on 08/06/2018 for evaluation of significant shortness of breath.  1.  Acute renal failure/chronic kidney disease stage III baseline creatinine 1.7/proteinuria.  Patient's baseline creatinine appears to be 1.7.  Creatinine currently significantly worse.  Could be related to cardiorenal syndrome.   -Renal parameters worsening.  Full serologic work-up still pending including  ANCA antibodies, GBM antibodies.  Patient still producing some urine.  Therefore no urgent indication for dialysis however if volume becomes an issue we may need to consider this sooner rather than later.  2.  Acute systolic heart failure.  As above Lasix has been held today given worsening renal function.  However we are in agreement with cardiology that should she develop a volume  issue we may need to proceed with renal replacement therapy.   LOS: 3 Jessica Burgess 5/24/202012:13 PM

## 2018-08-09 NOTE — Progress Notes (Signed)
Severe dyspnea after walking to the BR.  RR 27, Sats on room air 97%, placed on O2 2L [place] for comfort.

## 2018-08-09 NOTE — Progress Notes (Signed)
[facility] NAME: Jessica Burgess    MR#:  [id number]  DATE OF BIRTH:  1955-08-21  SUBJECTIVE:   Patient having migraine headache. Took Imitrex this morning. No shortness of breath. REVIEW OF SYSTEMS:   Review of Systems  Constitutional: Negative for chills, fever and weight loss.  HENT: Negative for ear discharge, ear pain and nosebleeds.   Eyes: Negative for blurred vision, pain and discharge.  Respiratory: Positive for shortness of breath. Negative for sputum production, wheezing and stridor.   Cardiovascular: Negative for chest pain, palpitations, orthopnea and PND.  Gastrointestinal: Negative for abdominal pain, diarrhea, nausea and vomiting.  Genitourinary: Negative for frequency and urgency.  Musculoskeletal: Negative for back pain and joint pain.  Neurological: Positive for weakness. Negative for sensory change, speech change and focal weakness.  Psychiatric/Behavioral: Negative for depression and hallucinations. The patient is not nervous/anxious.    Tolerating Diet:yes Tolerating PT: ambulatory  DRUG ALLERGIES:  No Known Allergies  VITALS:  Blood pressure (!) 158/91, pulse 83, temperature 98.3 F (36.8 C), temperature source Oral, resp. rate 16, height 5\' 2"  (1.575 m), weight 76.8 kg, SpO2 95 %.  PHYSICAL EXAMINATION:   Physical Exam  GENERAL:  63 y.o.-year-old patient lying in the bed with no acute distress.  EYES: Pupils equal, round, reactive to light and accommodation. No scleral icterus. Extraocular muscles intact.  HEENT: Head atraumatic, normocephalic. Oropharynx and nasopharynx clear.  NECK:  Supple, no jugular venous distention. No thyroid enlargement, no tenderness.  LUNGS: priest breath sounds bilaterally, no wheezing, few rales,no rhonchi. No use of accessory muscles of respiration.  CARDIOVASCULAR: S1, S2 normal. No murmurs, rubs, or gallops.  ABDOMEN: Soft, nontender, nondistended. Bowel  sounds present. No organomegaly or mass.  EXTREMITIES: No cyanosis, clubbing or edema b/l.    NEUROLOGIC: Cranial nerves II through XII are intact. No focal Motor or sensory deficits b/l.   PSYCHIATRIC:  patient is alert and oriented x 3.  SKIN: No obvious rash, lesion, or ulcer.   LABORATORY PANEL:  CBC Recent Labs  Lab 08/08/18 0211  WBC 8.8  HGB 7.5*  HCT 23.4*  PLT 49*    Chemistries  Recent Labs  Lab 08/06/18 2014  08/09/18 0531  NA 138   < > 137  K 5.5*   < > 5.0  CL 107   < > 104  CO2 20*   < > 23  GLUCOSE 148*   < > 106*  BUN 69*   < > 89*  CREATININE 3.19*   < > 3.76*  CALCIUM 8.3*   < > 8.2*  AST 49*  --   --   ALT 22  --   --   ALKPHOS 66  --   --   BILITOT 1.2  --   --    < > = values in this interval not displayed.   Cardiac Enzymes Recent Labs  Lab 08/07/18 1507  TROPONINI 0.35*   RADIOLOGY:  Ct Chest Wo Contrast  Result Date: 08/09/2018 CLINICAL DATA:  Hemoptysis, ovarian cancer EXAM: CT CHEST WITHOUT CONTRAST TECHNIQUE: Multidetector CT imaging of the chest was performed following the standard protocol without IV contrast. COMPARISON:  PET-CT, 05/07/2018, CT chest, 02/02/2018 FINDINGS: Cardiovascular: Cardiomegaly. Trace pericardial effusion. Right chest port catheter. Mediastinum/Nodes: No enlarged mediastinal, hilar, or axillary lymph nodes. Thyroid gland, trachea, and esophagus demonstrate no significant findings. Lungs/Pleura: There is scattered bilateral ground-glass and clustered nodular pulmonary opacity, most conspicuous in the bilateral  upper lobes and new compared to prior PET-CT dated 05/07/2018 (series 3, image 58). There is a new 3 mm subpleural pulmonary nodule of the left pulmonary apex (series 3, image 23) and a new 4 mm subpleural nodule of the right middle lobe (series 3, image 70). There is a thin-walled pneumatocele unchanged from prior examination. Small bilateral pleural effusions and associated atelectasis or consolidation. Upper  Abdomen: No acute abnormality. Musculoskeletal: No chest wall mass or suspicious bone lesions identified. IMPRESSION: 1. There is scattered bilateral ground-glass and clustered nodular pulmonary opacity, most conspicuous in the bilateral upper lobes and new compared to prior PET-CT dated 05/07/2018 (series 3, image 58). Small bilateral pleural effusions and associated atelectasis or consolidation. Findings are consistent with atypical infection or inflammation. Drug toxicity is a significant differential consideration in the setting of malignancy. 2. There is a new 3 mm subpleural pulmonary nodule of the left pulmonary apex (series 3, image 23) and a new 4 mm subpleural nodule of the right middle lobe (series 3, image 70). These are nonspecific and may be infectious or inflammatory although concerning for metastatic disease in the setting of primary ovarian malignancy. Attention on follow-up. Electronically Signed   By: Eddie Candle M.D.   On: 08/09/2018 12:25   ASSESSMENT AND PLAN:   Jessica Burgess  is a 63 y.o. female with a known history of ovarian cancer, CKD, hypothyroidism, hypertension and anemia.  Patient is currently undergoing chemotherapy followed by Dr. Mellody Drown and Dr. Grayland Ormond. Presented to the emergency room with a 2-day history of increased shortness of breath and nonproductive cough.  Shortness of breath is made worse when lying flat.    1.  Acute diastolic CHF-BNP 2353 - Patient started on Lasix 40 mg IV twice daily-- on daily lasix IV 40 mg-- Lasix held given elevated creatinine - echocardiogram  left ventricle has mildly reduced systolic function, with an ejection fraction of 45-50%. The cavity size was normal. There is mildly increased left ventricular wall thickness. Concern for anterior/anteroseptal wall hypokinesis - Placed on BiPAP therapy for increased work of breathing--now on RA sats 96% - appreciate cardiology input from Dr Archer Asa -?v/q scan  -added hydralazine  and imdur per cardiology recommendation for after load reduction  2.  Acute on chronic renal failure-III - We will continue to monitor renal function closely particularly given initiation of diuretics for acute CHF -?cardiorenal syndrome -creat baslien 1.7 -creat 3.20--3.19--3.4--3.76 -pending renal labs ANCA,GBM,ENA,DNA/DS, AntiCH,centro, Jo  3.  Hyperkalemia - Potassium level is 5.5.  4.  High-grade ovarian cancer - Patient is status post chemotherapy with gemcitabine - seen by  Dr. Grayland Ormond   5.  Lactic acidosis- 3.4 lactic acid level--3.1--2.0 - Will repeat lactic acid level as patient is diuresed.  No other evidence of infectious process.  Blood, urine, and sputum cultures are pending.  Will treat as indicated  6.  Hypothyroidism - Synthroid continued  7. Anemia of chronic dz -recently received 2 units BT at the cancer center  8. TCP suspected due to chemo stable at 49K  DVT and PPI prophylaxis have been initiated.    CODE STATUS: full  DVT Prophylaxis: heparin  TOTAL TIME TAKING CARE OF THIS PATIENT: *30* minutes.  >50% time spent on counselling and coordination of care  POSSIBLE D/C IN *?* DAYS, DEPENDING ON CLINICAL CONDITION.  Note: This dictation was prepared with Dragon dictation along with smaller phrase technology. Any transcriptional errors that result from this process are unintentional.  Fritzi Mandes M.D on 08/09/2018  at 12:37 PM  Between 7am to 6pm - Pager - [phone]  After 6pm go to [email or web address] - password [email or web address] [facility]  Office  [phone]  CC: Primary care physician; Steele Sizer, MDPatient ID: [name], female   DOB: 06-24-1955, 63 y.o.   MRN: [id number]

## 2018-08-09 NOTE — Progress Notes (Signed)
Renal function worsening, will hold lasix for now.

## 2018-08-09 NOTE — Progress Notes (Signed)
She continues to have a severe headache.  She says the narcotics did not seem to help at all.  Her BP has been elevated at times, it is currently 158/91.  He has no facial droop, no arm drift, and her speech is clear.  I've given her 2 ice packs to see if this is helpful.

## 2018-08-09 NOTE — Progress Notes (Signed)
Patient ID: Jessica Burgess, female   DOB: [birth year], 63 y.o.   MRN: [id number]     PCP-Cardiologist: No primary care provider on file.   Subjective:    Typical migraine headache for her this morning.  Not short of breath at rest.  BUN/creatinine trending up, 89/3.76 today.  She has been having hemoptysis, hgb 8.7 => 7.5, plts 49.  BP remains elevated.   CXR (5/22) with CHF pattern  Echo: EF 40-45%, possible anterior/anteroseptal hypokinesis, normal RV  Objective:   Weight Range: 76.8 kg Body mass index is 30.98 kg/m.   Vital Signs:   Temp:  [97.6 F (36.4 C)-98.5 F (36.9 C)] 98.3 F (36.8 C) (05/24 0814) Pulse Rate:  [77-97] 83 (05/24 0814) Resp:  [16-24] 16 (05/24 0814) BP: (145-191)/(81-119) 158/91 (05/24 0814) SpO2:  [89 %-98 %] 95 % (05/24 0814) Weight:  [76.8 kg-77.2 kg] 76.8 kg (05/24 0333) Last BM Date: 08/07/18  Weight change: Filed Weights   08/08/18 0422 08/08/18 1413 08/09/18 0333  Weight: 77.8 kg 77.2 kg 76.8 kg    Intake/Output:   Intake/Output Summary (Last 24 hours) at 08/09/2018 1144 Last data filed at 08/09/2018 1008 Gross per 24 hour  Intake 120 ml  Output 250 ml  Net -130 ml      Physical Exam    General: NAD Neck: JVP 10-12 cm, no thyromegaly or thyroid nodule.  Lungs: Crackles at bases bilaterally.  CV: Nondisplaced PMI.  Heart regular S1/S2, no S3/S4, no murmur.  No peripheral edema.  Abdomen: Soft, nontender, no hepatosplenomegaly, no distention.  Skin: Intact without lesions or rashes.  Neurologic: Alert and oriented x 3.  Psych: Normal affect. Extremities: No clubbing or cyanosis.  HEENT: Normal.    Telemetry   NSR 80s, personally reviewed  Labs    CBC Recent Labs    08/06/18 2014 08/07/18 0340 08/08/18 0211  WBC 12.8* 13.0* 8.8  NEUTROABS 10.6*  --  5.6  HGB 8.6* 8.7* 7.5*  HCT 26.5* 27.2* 23.4*  MCV 105.6* 107.5* 106.4*  PLT 58* 49* 49*   Basic Metabolic Panel Recent Labs    08/08/18 0211 08/09/18 0531   NA 137 137  K 5.1 5.0  CL 104 104  CO2 22 23  GLUCOSE 100* 106*  BUN 79* 89*  CREATININE 3.50* 3.76*  CALCIUM 8.0* 8.2*   Liver Function Tests Recent Labs    08/06/18 2014  AST 49*  ALT 22  ALKPHOS 66  BILITOT 1.2  PROT 6.5  ALBUMIN 3.3*   No results for input(s): LIPASE, AMYLASE in the last 72 hours. Cardiac Enzymes Recent Labs    08/07/18 0340 08/07/18 0832 08/07/18 1507  TROPONINI 1.10* 0.81* 0.35*    BNP: BNP (last 3 results) Recent Labs    08/06/18 2015 08/07/18 0340  BNP 1,407.0* 2,727.0*    ProBNP (last 3 results) No results for input(s): PROBNP in the last 8760 hours.   D-Dimer No results for input(s): DDIMER in the last 72 hours. Hemoglobin A1C No results for input(s): HGBA1C in the last 72 hours. Fasting Lipid Panel No results for input(s): CHOL, HDL, LDLCALC, TRIG, CHOLHDL, LDLDIRECT in the last 72 hours. Thyroid Function Tests No results for input(s): TSH, T4TOTAL, T3FREE, THYROIDAB in the last 72 hours.  Invalid input(s): FREET3  Other results:   Imaging    No results found.   Medications:     Scheduled Medications: . DULoxetine  60 mg Oral Daily  . ferrous sulfate  325 mg Oral Daily  .  hydrALAZINE  25 mg Oral TID  . isosorbide mononitrate  30 mg Oral Daily  . levothyroxine  25 mcg Oral Q0600  . metoprolol succinate  25 mg Oral Daily  . mometasone-formoterol  2 puff Inhalation BID  . sodium chloride flush  3 mL Intravenous Q12H    Infusions: . sodium chloride      PRN Medications: sodium chloride, acetaminophen, cyclobenzaprine, fluticasone, ipratropium-albuterol, labetalol, lidocaine, ondansetron (ZOFRAN) IV, oxyCODONE-acetaminophen **AND** oxyCODONE, sodium chloride flush, temazepam    Assessment/Plan   1. Acute systolic CHF: Echo with EF 40-45%, anterior/anteroseptal HK, normal RV.  Cause of cardiomyopathy uncertain, but with possible regional wall motion abnormalities, CAD remains a concern.  Creatinine higher  today at 3.76.  She diuresed some yesterday with Lasix 40 mg IV bid and weight down 1 lb. The nephrologist stopped her Lasix today.  On exam, she remains volume overloaded.  - Difficult situation, she is volume overloaded but creatinine continues to rise for uncertain reason.  If creatinine continues to go up, she may need at least temporary RRT for volume management. Will leave off Lasix today.  - BP remains high, increase hydralazine to 25 mg tid + continue Imdur 30 daily, can titrate up as needed.  - Continue Toprol XL 25 mg daily.  2. AKI on CKD stage 3: Uncertain etiology.  Creatinine higher today as above.  She has not been hypotensive.  She does report hemoptysis, so need to workup reno-pulmonary syndromes (Wegener's, Goodpasture's).   - Anti-GBM Ab and ANCA pending.  - Nephrology following.  If creatinine worsens and we are unable to control volume overload, she may need at least temporary RRT.  3. Anemia/thrombocytopenia: Post-chemo but also now with hemoptysis.   4. Elevated troponin: TnI peak 1.1, now trending down.  No chest pain.  ECG ok but noted to have possible anterior and anteroseptal wall motion abnormalities, possible underlying CAD.  - Not candidate at this point for cath with AKI, anemia, thrombocytopenia.  No ASA yet with low platelets but will give statin.  - Likely would aim for Cardiolite, can arrange as outpatient.  5. Ovarian cancer: Per oncology.  6. Hemoptysis: ?In relation to reno-pulmonary syndrome as above.  - CT chest w/o contrast.    Length of Stay: 3  Loralie Champagne, MD  08/09/2018, 11:44 AM  Advanced Heart Failure Team Pager [phone] (M-F; 7a - 4p)  Please contact [employer] for night-coverage after hours (4p -7a ) and weekends on [email or web address]

## 2018-08-10 ENCOUNTER — Inpatient Hospital Stay: Payer: PPO

## 2018-08-10 DIAGNOSIS — N186 End stage renal disease: Secondary | ICD-10-CM

## 2018-08-10 DIAGNOSIS — Z992 Dependence on renal dialysis: Secondary | ICD-10-CM

## 2018-08-10 LAB — BASIC METABOLIC PANEL
Anion gap: 9 (ref 5–15)
BUN: 90 mg/dL — ABNORMAL HIGH (ref 8–23)
CO2: 22 mmol/L (ref 22–32)
Calcium: 7.9 mg/dL — ABNORMAL LOW (ref 8.9–10.3)
Chloride: 103 mmol/L (ref 98–111)
Creatinine, Ser: 4.13 mg/dL — ABNORMAL HIGH (ref 0.44–1.00)
GFR calc Af Amer: 12 mL/min — ABNORMAL LOW (ref >60)
GFR calc non Af Amer: 11 mL/min — ABNORMAL LOW (ref >60)
Glucose, Bld: 123 mg/dL — ABNORMAL HIGH (ref 70–99)
Potassium: 4.4 mmol/L (ref 3.5–5.1)
Sodium: 134 mmol/L — ABNORMAL LOW (ref 135–145)

## 2018-08-10 LAB — PHOSPHORUS: Phosphorus: 5.5 mg/dL — ABNORMAL HIGH (ref 2.5–4.6)

## 2018-08-10 LAB — GLOMERULAR BASEMENT MEMBRANE ANTIBODIES: GBM Ab: 3 units (ref 0–20)

## 2018-08-10 MED ORDER — SUMATRIPTAN SUCCINATE 50 MG PO TABS
100.0000 mg | ORAL_TABLET | Freq: Once | ORAL | Status: AC
  Administered 2018-08-10: 100 mg via ORAL
  Filled 2018-08-10: qty 2

## 2018-08-10 MED ORDER — LIDOCAINE HCL (PF) 1 % IJ SOLN
5.0000 mL | INTRAMUSCULAR | Status: DC | PRN
  Filled 2018-08-10: qty 5

## 2018-08-10 MED ORDER — HEPARIN SODIUM (PORCINE) 1000 UNIT/ML DIALYSIS
1000.0000 [IU] | INTRAMUSCULAR | Status: DC | PRN
  Filled 2018-08-10: qty 1

## 2018-08-10 MED ORDER — SODIUM CHLORIDE 0.9 % IV SOLN: 100.0000 mL | INTRAVENOUS | Status: DC | PRN

## 2018-08-10 MED ORDER — CHLORHEXIDINE GLUCONATE CLOTH 2 % EX PADS
6.0000 | MEDICATED_PAD | Freq: Every day | CUTANEOUS | Status: DC
  Administered 2018-08-11 – 2018-08-14 (×4): 6 via TOPICAL

## 2018-08-10 MED ORDER — ALTEPLASE 2 MG IJ SOLR: 2.0000 mg | Freq: Once | INTRAMUSCULAR | Status: DC | PRN

## 2018-08-10 MED ORDER — LIDOCAINE-PRILOCAINE 2.5-2.5 % EX CREA
1.0000 "application " | TOPICAL_CREAM | CUTANEOUS | Status: DC | PRN
  Filled 2018-08-10: qty 5

## 2018-08-10 MED ORDER — PENTAFLUOROPROP-TETRAFLUOROETH EX AERO
1.0000 "application " | INHALATION_SPRAY | CUTANEOUS | Status: DC | PRN
  Filled 2018-08-10: qty 30

## 2018-08-10 MED ORDER — MORPHINE SULFATE (PF) 2 MG/ML IV SOLN
INTRAVENOUS | Status: AC
  Administered 2018-08-10: 2 mg via INTRAVENOUS
  Filled 2018-08-10: qty 1

## 2018-08-10 MED ORDER — MORPHINE SULFATE (PF) 2 MG/ML IV SOLN
2.0000 mg | INTRAVENOUS | Status: AC
  Administered 2018-08-10: 2 mg via INTRAVENOUS

## 2018-08-10 MED ORDER — TUBERCULIN PPD 5 UNIT/0.1ML ID SOLN
5.0000 [IU] | Freq: Once | INTRADERMAL | Status: AC
  Administered 2018-08-11: 5 [IU] via INTRADERMAL
  Filled 2018-08-10 (×2): qty 0.1

## 2018-08-10 NOTE — Progress Notes (Signed)
[facility]  ROUNDING NOTE   Subjective:  Patient seen at bedside. Renal parameters are worsening. We discussed the potential for renal replacement therapy and she is willing to undergo this. Dr. Trula Slade has been consulted for dialysis catheter placement   Objective:  Vital signs in last 24 hours:  Temp:  [97.7 F (36.5 C)-99 F (37.2 C)] 99 F (37.2 C) (05/25 0724) Pulse Rate:  [79-95] 84 (05/25 1001) Resp:  [16-28] 20 (05/25 0724) BP: (147-186)/(84-109) 147/84 (05/25 1001) SpO2:  [97 %-99 %] 97 % (05/25 0724)  Weight change:  Filed Weights   08/08/18 0422 08/08/18 1413 08/09/18 0333  Weight: 77.8 kg 77.2 kg 76.8 kg    Intake/Output: I/O last 3 completed shifts: In: 600 [P.O.:600] Out: 0    Intake/Output this shift:  Total I/O In: 240 [P.O.:240] Out: 820 [Urine:820]  Physical Exam: General: No acute distress  Head: Normocephalic, atraumatic. Moist oral mucosal membranes  Eyes: Anicteric  Neck: Supple, trachea midline  Lungs:  Basilar rales, normal effort  Heart: S1S2 no rubs  Abdomen:  Soft, nontender, bowel sounds present  Extremities: 1+ peripheral edema.  Neurologic: Awake, alert, following commands  Skin: No lesions       Basic Metabolic Panel: Recent Labs  Lab 08/06/18 2014 08/07/18 0340 08/08/18 0211 08/09/18 0531 08/10/18 0539  NA 138 139 137 137 134*  K 5.5* 5.5* 5.1 5.0 4.4  CL 107 107 104 104 103  CO2 20* 21* _0 GLUCOSE 148* 119* 100* 106* 123*  BUN 69* 73* 79* 89* 90*  CREATININE 3.19* 3.42* 3.50* 3.76* 4.13*  CALCIUM 8.3* 8.4* 8.0* 8.2* 7.9*    Liver Function Tests: Recent Labs  Lab 08/05/18 0829 08/06/18 2014  AST 32 49*  ALT 21 22  ALKPHOS 61 66  BILITOT 0.9 1.2  PROT 5.7* 6.5  ALBUMIN 2.9* 3.3*   No results for input(s): LIPASE, AMYLASE in the last 168 hours. No results for input(s): AMMONIA in the last 168 hours.  CBC: Recent Labs  Lab 08/05/18 0829 08/06/18 2014 08/07/18 0340 08/08/18 0211   WBC 4.6 12.8* 13.0* 8.8  NEUTROABS 2.7 10.6*  --  5.6  HGB 8.1* 8.6* 8.7* 7.5*  HCT 24.1* 26.5* 27.2* 23.4*  MCV 103.0* 105.6* 107.5* 106.4*  PLT 45* 58* 49* 49*    Cardiac Enzymes: Recent Labs  Lab 08/06/18 2014 08/07/18 0340 08/07/18 0832 08/07/18 1507  TROPONINI 0.06* 1.10* 0.81* 0.35*    BNP: Invalid input(s): POCBNP  CBG: Recent Labs  Lab 08/07/18 0244  GLUCAP 111*    Microbiology: Results for orders placed or performed during the hospital encounter of 08/06/18  SARS Coronavirus 2 (CEPHEID - Performed in [place] hospital lab), Hosp Order     Status: None   Collection Time: 08/06/18  8:15 PM  Result Value Ref Range Status   SARS Coronavirus 2 NEGATIVE NEGATIVE Final    Comment: (NOTE) If result is NEGATIVE SARS-CoV-2 target nucleic acids are NOT DETECTED. The SARS-CoV-2 RNA is generally detectable in upper and lower  respiratory specimens during the acute phase of infection. The lowest  concentration of SARS-CoV-2 viral copies this assay can detect is 250  copies / mL. A negative result does not preclude SARS-CoV-2 infection  and should not be used as the sole basis for treatment or other  patient management decisions.  A negative result may occur with  improper specimen collection / handling, submission of specimen other  than nasopharyngeal swab, presence of viral mutation(s)  within the  areas targeted by this assay, and inadequate number of viral copies  (<250 copies / mL). A negative result must be combined with clinical  observations, patient history, and epidemiological information. If result is POSITIVE SARS-CoV-2 target nucleic acids are DETECTED. The SARS-CoV-2 RNA is generally detectable in upper and lower  respiratory specimens dur ing the acute phase of infection.  Positive  results are indicative of active infection with SARS-CoV-2.  Clinical  correlation with patient history and other diagnostic information is  necessary to determine  patient infection status.  Positive results do  not rule out bacterial infection or co-infection with other viruses. If result is PRESUMPTIVE POSTIVE SARS-CoV-2 nucleic acids MAY BE PRESENT.   A presumptive positive result was obtained on the submitted specimen  and confirmed on repeat testing.  While 2019 novel coronavirus  (SARS-CoV-2) nucleic acids may be present in the submitted sample  additional confirmatory testing may be necessary for epidemiological  and / or clinical management purposes  to differentiate between  SARS-CoV-2 and other Sarbecovirus currently known to infect humans.  If clinically indicated additional testing with an alternate test  methodology [phone]) is advised. The SARS-CoV-2 RNA is generally  detectable in upper and lower respiratory sp ecimens during the acute  phase of infection. The expected result is Negative. Fact Sheet for Patients:  StrictlyIdeas.no Fact Sheet for Healthcare Providers: BankingDealers.co.za This test is not yet approved or cleared by the [place] FDA and has been authorized for detection and/or diagnosis of SARS-CoV-2 by FDA under an Emergency Use Authorization (EUA).  This EUA will remain in effect (meaning this test can be used) for the duration of the COVID-19 declaration under Section 564(b)(1) of the Act, 21 U.S.C. section 360bbb-3(b)(1), unless the authorization is terminated or revoked sooner. Performed at [facility], [address] [place], [place] [address]   MRSA PCR Screening     Status: None   Collection Time: 08/07/18  2:38 AM  Result Value Ref Range Status   MRSA by PCR NEGATIVE NEGATIVE Final    Comment:        The GeneXpert MRSA Assay (FDA approved for NASAL specimens only), is one component of a comprehensive MRSA colonization surveillance program. It is not intended to diagnose MRSA infection nor to guide or monitor treatment for MRSA  infections. Performed at [facility], [address] [place], [place] [address]   CULTURE, BLOOD (ROUTINE X 2) w Reflex to ID Panel     Status: None (Preliminary result)   Collection Time: 08/07/18  3:40 AM  Result Value Ref Range Status   Specimen Description BLOOD LEFT ANTECUBITAL  Final   Special Requests   Final    BOTTLES DRAWN AEROBIC AND ANAEROBIC Blood Culture results may not be optimal due to an excessive volume of blood received in culture bottles   Culture   Final    NO GROWTH 3 DAYS Performed at [facility], [address] [place], [place] [address]    Report Status PENDING  Incomplete  CULTURE, BLOOD (ROUTINE X 2) w Reflex to ID Panel     Status: None (Preliminary result)   Collection Time: 08/07/18  3:40 AM  Result Value Ref Range Status   Specimen Description BLOOD BLOOD LEFT HAND  Final   Special Requests   Final    BOTTLES DRAWN AEROBIC AND ANAEROBIC Blood Culture results may not be optimal due to an excessive volume of blood received in culture bottles  Culture   Final    NO GROWTH 3 DAYS Performed at [facility], [address] [place], [place] [address]    Report Status PENDING  Incomplete  Urine Culture     Status: None   Collection Time: 08/08/18  2:10 AM  Result Value Ref Range Status   Specimen Description   Final    URINE, RANDOM Performed at [facility], [address], [place], [place] [address]    Special Requests   Final    Normal Performed at [facility], [address] [place], [place] [address]    Culture   Final    NO GROWTH Performed at [facility], [place] [address], [place],  [address]    Report Status 08/09/2018 FINAL  Final    Coagulation Studies: No results for input(s): LABPROT, INR in the last 72 hours.  Urinalysis: Recent Labs    08/08/18 0212  COLORURINE YELLOW*  LABSPEC 1.012  PHURINE 5.0  GLUCOSEU NEGATIVE  HGBUR LARGE*  BILIRUBINUR NEGATIVE   KETONESUR NEGATIVE  PROTEINUR 30*  NITRITE NEGATIVE  LEUKOCYTESUR NEGATIVE      Imaging: Ct Chest Wo Contrast  Result Date: 08/09/2018 CLINICAL DATA:  Hemoptysis, ovarian cancer EXAM: CT CHEST WITHOUT CONTRAST TECHNIQUE: Multidetector CT imaging of the chest was performed following the standard protocol without IV contrast. COMPARISON:  PET-CT, 05/07/2018, CT chest, 02/02/2018 FINDINGS: Cardiovascular: Cardiomegaly. Trace pericardial effusion. Right chest port catheter. Mediastinum/Nodes: No enlarged mediastinal, hilar, or axillary lymph nodes. Thyroid gland, trachea, and esophagus demonstrate no significant findings. Lungs/Pleura: There is scattered bilateral ground-glass and clustered nodular pulmonary opacity, most conspicuous in the bilateral upper lobes and new compared to prior PET-CT dated 05/07/2018 (series 3, image 58). There is a new 3 mm subpleural pulmonary nodule of the left pulmonary apex (series 3, image 23) and a new 4 mm subpleural nodule of the right middle lobe (series 3, image 70). There is a thin-walled pneumatocele unchanged from prior examination. Small bilateral pleural effusions and associated atelectasis or consolidation. Upper Abdomen: No acute abnormality. Musculoskeletal: No chest wall mass or suspicious bone lesions identified. IMPRESSION: 1. There is scattered bilateral ground-glass and clustered nodular pulmonary opacity, most conspicuous in the bilateral upper lobes and new compared to prior PET-CT dated 05/07/2018 (series 3, image 58). Small bilateral pleural effusions and associated atelectasis or consolidation. Findings are consistent with atypical infection or inflammation. Drug toxicity is a significant differential consideration in the setting of malignancy. 2. There is a new 3 mm subpleural pulmonary nodule of the left pulmonary apex (series 3, image 23) and a new 4 mm subpleural nodule of the right middle lobe (series 3, image 70). These are nonspecific and may be  infectious or inflammatory although concerning for metastatic disease in the setting of primary ovarian malignancy. Attention on follow-up. Electronically Signed   By: Eddie Candle M.D.   On: 08/09/2018 12:25     Medications:   . sodium chloride     . atorvastatin  40 mg Oral q1800  . DULoxetine  60 mg Oral Daily  . ferrous sulfate  325 mg Oral Daily  . hydrALAZINE  25 mg Oral TID  . isosorbide mononitrate  30 mg Oral Daily  . levothyroxine  25 mcg Oral Q0600  . metoprolol succinate  25 mg Oral Daily  . mometasone-formoterol  2 puff Inhalation BID  . sodium chloride flush  10 mL Intravenous Q12H  . sodium chloride flush  3 mL Intravenous Q12H  sodium chloride, acetaminophen, cyclobenzaprine, fluticasone, hydrALAZINE, ipratropium-albuterol, labetalol, lidocaine, ondansetron (ZOFRAN) IV, oxyCODONE-acetaminophen **AND** oxyCODONE, sodium chloride flush, temazepam  Assessment/ Plan:  63 y.o. female with a PMHx of ovarian cancer stage III, chronic kidney disease stage III baseline creatinine 1.7 with EGFR of 30, GERD, migraine headaches, cervical spinal stenosis, who was admitted to [facility] on 08/06/2018 for evaluation of significant shortness of breath.  1.  Acute renal failure/chronic kidney disease stage III baseline creatinine 1.7/proteinuria.  Patient's baseline creatinine appears to be 1.7.  Creatinine currently significantly worse.  Could be related to cardiorenal syndrome.   -Renal function continues to worsen.  Creatinine up to 4.13 with a BUN of 90.  She was diuresed earlier in admission but this was held given worsening renal parameters.  At this point time we have recommended a course of renal replacement therapy.  Temporary dialysis catheter to be placed and we will plan for first dialysis treatment today.  Extensive serologic work-up still pending as well.  2.  Acute systolic heart failure.  Lasix currently on hold as above.  We will begin ultrafiltration with dialysis starting  tomorrow.   LOS: 4 Bellany Elbaum 5/25/202012:12 PM

## 2018-08-10 NOTE — Op Note (Signed)
    Patient name: Jessica Burgess MRN: [id number] DOB: February 14, 1956 Sex: female  * No surgery found * Pre-operative Diagnosis: ESRD Post-operative diagnosis:  Same Surgeon:  Annamarie Major Procedure:   U/S guided placement of a right femoral vein temporary dialysis catheter Anesthesia:  Local and 2mg  Morphine Blood Loss:  minimal  Findings:  Normal anatomy  Indications: The patient has developed worsening acute on chronic renal insufficiency.  She needs dialysis today.  We discussed proceeding with a temporary catheter.  Procedure: The patient was transferred to the ICU and to bed 1.  A timeout was called.  The patient was prepped and draped in the usual sterile fashion.  Ultrasound was used to evaluate the right common femoral vein which was widely patent and easily compressible.  1% lidocaine was used for local anesthesia.  The right common femoral vein was cannulated under ultrasound guidance with an 18-gauge needle.  An 035 guidewire was advanced without resistance.  Subcutaneous tract was dilated with sequential dilators and a 30 cm dual-lumen dialysis catheter was inserted without difficulty.  Both ports flushed and aspirated without difficulty.  The catheter was then sutured into position and a sterile dressing was applied.  A post procedure KUB was obtained which showed that the catheter was in the appropriate position.  There were no immediate complications.      Theotis Burrow, M.D., [facility] Office: [phone] Pager:  [phone]

## 2018-08-10 NOTE — Progress Notes (Signed)
HD Tx started without issue.   08/10/18 1610  Hand-Off documentation  Report given to (Full Name) Beatris Ship, RN   Report received from (Full Name) Georg Ruddle, RN   Vital Signs  Temp 98.9 F (37.2 C)  Temp Source Oral  Pulse Rate 86  Pulse Rate Source Monitor  Resp (!) 22  BP (!) 185/101  BP Location Left Arm  BP Method Automatic  Patient Position (if appropriate) Lying  Oxygen Therapy  SpO2 94 %  O2 Device Room Air  Pain Assessment  Pain Scale 0-10  Pain Score 3  Pain Type Acute pain  Pain Location Head  Pain Intervention(s) Medication (See eMAR)  Dialysis Weight  Weight 76.6 kg  Type of Weight Pre-Dialysis  Time-Out for Hemodialysis  What Procedure? HD   Pt Identifiers(min of two) First/Last Name;MRN/Account#  Correct Site? Yes  Correct Side? Yes  Correct Procedure? Yes  Consents Verified? Yes  Rad Studies Available? N/A  Safety Precautions Reviewed? Yes  Engineer, civil (consulting) Number 3  Station Number 2  UF/Alarm Test Passed  Conductivity: Meter 14  Conductivity: Machine  14.1  pH 7.2  Reverse Osmosis Main  Normal Saline Lot Number [id number]  Dialyzer Lot Number [id number]  Disposable Set Lot Number [id number]  Machine Temperature 98.6 F (37 C)  Musician and Audible Yes  Blood Lines Intact and Secured Yes  Pre Treatment Patient Checks  Vascular access used during treatment Catheter  Hepatitis B Surface Antigen Results  (unknown)  Date Hepatitis B Surface Antigen Drawn 08/10/18  Date Hepatitis B Surface Antibody Drawn 08/10/18  Hemodialysis Consent Verified Yes  Hemodialysis Standing Orders Initiated Yes  ECG (Telemetry) Monitor On Yes  Prime Ordered Normal Saline  Length of  DialysisTreatment -hour(s) 1.5 Hour(s)  Dialysis Treatment Comments Na 140, BFR 150, 1.5 Hours TX time   Dialyzer Elisio 17H NR  Dialysate 3K, 2.5 Ca  Dialysis Anticoagulant None  Dialysate Flow Ordered 300  Blood Flow Rate Ordered 150 mL/min  Ultrafiltration Goal 0  Liters  Pre Treatment Labs Hepatitis B Surface Antigen;Phosphorus  Dialysis Blood Pressure Support Ordered Normal Saline  During Hemodialysis Assessment  Blood Flow Rate (mL/min) 150 mL/min  Arterial Pressure (mmHg) -70 mmHg  Venous Pressure (mmHg) 70 mmHg  Transmembrane Pressure (mmHg) 40 mmHg  Ultrafiltration Rate (mL/min) 10 mL/min  Dialysate Flow Rate (mL/min) 300 ml/min  Conductivity: Machine  14.1  HD Safety Checks Performed Yes  Dialysis Fluid Bolus Normal Saline  Bolus Amount (mL) 250 mL  Intra-Hemodialysis Comments Tx initiated  Education / Care Plan  Dialysis Education Provided Yes  Documented Education in Care Plan Yes  Hemodialysis Catheter Right Femoral vein Double-lumen  Placement Date/Time: 08/10/18 1514   Placed prior to admission: No  Time Out: Correct patient;Correct procedure;Correct site  Maximum sterile barrier precautions: Hand hygiene;Cap;Mask;Sterile gown;Sterile gloves;Large sterile sheet  Site Prep: Chlorh...  Site Condition No complications  Blue Lumen Status Blood return noted  Red Lumen Status Blood return noted  Purple Lumen Status N/A  Dressing Type Biopatch;Occlusive  Dressing Status Clean;Dry;Intact

## 2018-08-10 NOTE — Care Management Important Message (Signed)
Important Message  Patient Details  Name: SUVI ARCHULETTA MRN: [id number] Date of Birth: [birth year]   Medicare Important Message Given:  Yes    Dannette Barbara 08/10/2018, 11:46 AM

## 2018-08-10 NOTE — Progress Notes (Signed)
Post HD Assessment    08/10/18 1745  Neurological  Level of Consciousness Alert  Orientation Level Oriented X4  Respiratory  Respiratory Pattern Regular;Labored  Chest Assessment Chest expansion symmetrical  Bilateral Breath Sounds Diminished  Cough None  Cardiac  Pulse Regular  Heart Sounds S1, S2  ECG Monitor Yes  Cardiac Rhythm NSR  Vascular  R Radial Pulse +2  L Radial Pulse +2  Psychosocial  Psychosocial (WDL) X  Patient Behaviors Anxious

## 2018-08-10 NOTE — Progress Notes (Signed)
HD Tx completed, tolearted well, pt did experience some anxiety not related to [facility], MD notified. Pt may benefit from anti anxiety Rx, prior to tx tomorrow. Overall Tx tolerated well for 1st HD Tx.    08/10/18 1740  Vital Signs  Pulse Rate 88  Pulse Rate Source Monitor  Resp (!) 22  BP (!) 209/105  BP Location Left Arm  BP Method Automatic  Patient Position (if appropriate) Lying  Oxygen Therapy  SpO2 100 %  O2 Device Nasal Cannula  O2 Flow Rate (L/min) 2 L/min  Pulse Oximetry Type Continuous  During Hemodialysis Assessment  HD Safety Checks Performed Yes  KECN 14.3 KECN  Dialysis Fluid Bolus Normal Saline  Bolus Amount (mL) 250 mL  Intra-Hemodialysis Comments Tx completed;Tolerated well

## 2018-08-10 NOTE — Progress Notes (Signed)
Temporary dialysis catheter placed by Dr. Trula Slade with this RN (Rapid Response RN) present for procedure. Ignatius Specking, IT consultant, gave report to this RN prior to transfer to ICU 1 for procedure. 2 mg of morphine given to patient prior to procedure for migraine pain. See flowsheets for vital signs. Portable x-ray obtained and placement confirmed by x-ray with Dr. Trula Slade while still in ICU 1. Report given to [facility] RN bedside when arrived back in 245.

## 2018-08-10 NOTE — Consult Note (Signed)
[facility] [place]  Patient name: Jessica Burgess MRN: [id number] DOB: 28-Apr-1955 Sex: female   REQUESTING PROVIDER:    Dr. Holley Raring   REASON FOR CONSULT:    Dialysis catheter  HISTORY OF PRESENT ILLNESS:   Jessica Burgess is a 63 y.o. female, who I have been asked to place a temporary dialysis catheter. The exact etiology of her acute on chronic renal failure is in question.  Her renal function has deteriorated to where she needs dialysis today.  She is also being treated for acute systolic heart failure with concern for ischemic cardiomyopathy.  This is being managed medically.  She is currently undergoing chemotherapy for ovarian cancer.  She is currently suffering from a migraine headache  PAST MEDICAL HISTORY    Past Medical History:  Diagnosis Date  . Allergic rhinitis, cause unspecified   . Anxiety state, unspecified   . Arthritis   . Asthma    only when sick   . Backache, unspecified   . Bronchitis    hx of when get sick  . Cancer ([place])    skin cancer , basal cell   . Cancer ([place]) 11/2016   ovarian  . Cervicalgia   . Complication of anesthesia   . Dermatophytosis of nail   . Dysmetabolic syndrome X   . Encounter for long-term (current) use of other medications   . Esophageal reflux   . Insomnia, unspecified   . Leukocytosis, unspecified   . Migraine without aura, without mention of intractable migraine without mention of status migrainosus   . Other and unspecified hyperlipidemia   . Other malaise and fatigue   . Overweight(278.02)   . Personal history of chemotherapy now   ovarian  . PONV (postoperative nausea and vomiting)   . Spinal stenosis in cervical region   . Symptomatic menopausal or female climacteric states   . Unspecified disorder of skin and subcutaneous tissue   . Unspecified vitamin D deficiency      FAMILY HISTORY   Family History  Problem Relation Age of Onset  .  Depression Mother   . Migraines Mother   . Dementia Father   . Diabetes Father   . Hyperlipidemia Father   . Hyperlipidemia Brother   . Hyperlipidemia Brother   . Breast cancer Paternal Aunt        95s    SOCIAL HISTORY:   Social History   Socioeconomic History  . Marital status: Single    Spouse name: Not on file  . Number of children: 0  . Years of education: some college  . Highest education level: 12th grade  Occupational History    Employer: DISABLED  . Occupation: Disabled   Social Needs  . Financial resource strain: Very hard  . Food insecurity:    Worry: Never true    Inability: Never true  . Transportation needs:    Medical: No    Non-medical: No  Tobacco Use  . Smoking status: Former Smoker    Packs/day: 1.50    Years: 20.00    Pack years: 30.00    Types: Cigarettes    Start date: 03/19/1979    Last attempt to quit: 08/25/1999    Years since quitting: 18.9  . Smokeless tobacco: Never Used  . Tobacco comment: smoking cessation materials not required  Substance and Sexual Activity  . Alcohol use: Not Currently    Alcohol/week: 0.0 standard drinks  . Drug use: No  . Sexual activity: Never  Lifestyle  . Physical activity:    Days per week: 0 days    Minutes per session: 0 min  . Stress: Very much  Relationships  . Social connections:    Talks on phone: Patient refused    Gets together: Patient refused    Attends religious service: Patient refused    Active member of club or organization: Patient refused    Attends meetings of clubs or organizations: Patient refused    Relationship status: Patient refused  . Intimate partner violence:    Fear of current or ex partner: No    Emotionally abused: No    Physically abused: No    Forced sexual activity: No  Other Topics Concern  . Not on file  Social History Narrative   Patient is single.    Patient lives with roommates.    Patient on disability    Patient has no children.    Patient has some  college     ALLERGIES:    No Known Allergies  CURRENT MEDICATIONS:    Current Facility-Administered Medications  Medication Dose Route Frequency Provider Last Rate Last Dose  . 0.9 %  sodium chloride infusion  250 mL Intravenous PRN Seals, Angela H, NP      . 0.9 %  sodium chloride infusion  100 mL Intravenous PRN Lateef, Munsoor, MD      . 0.9 %  sodium chloride infusion  100 mL Intravenous PRN Lateef, Munsoor, MD      . acetaminophen (TYLENOL) tablet 650 mg  650 mg Oral Q4H PRN Seals, Angela H, NP      . alteplase (CATHFLO ACTIVASE) injection 2 mg  2 mg Intracatheter Once PRN Lateef, Munsoor, MD      . atorvastatin (LIPITOR) tablet 40 mg  40 mg Oral q1800 Larey Dresser, MD   40 mg at 08/09/18 1744  . [START ON 08/11/2018] Chlorhexidine Gluconate Cloth 2 % PADS 6 each  6 each Topical Q0600 Lateef, Munsoor, MD      . cyclobenzaprine (FLEXERIL) tablet 10 mg  10 mg Oral TID PRN Bradly Bienenstock, NP      . DULoxetine (CYMBALTA) DR capsule 60 mg  60 mg Oral Daily Flora Lipps, MD   60 mg at 08/10/18 1001  . ferrous sulfate tablet 325 mg  325 mg Oral Daily Darel Hong D, NP   325 mg at 08/10/18 1001  . fluticasone (FLONASE) 50 MCG/ACT nasal spray 2 spray  2 spray Each Nare PRN Darel Hong D, NP      . heparin injection 1,000 Units  1,000 Units Dialysis PRN Lateef, Munsoor, MD      . hydrALAZINE (APRESOLINE) injection 10 mg  10 mg Intravenous Q4H PRN Fritzi Mandes, MD   10 mg at 08/09/18 1645  . hydrALAZINE (APRESOLINE) tablet 25 mg  25 mg Oral TID Larey Dresser, MD   25 mg at 08/10/18 0748  . ipratropium-albuterol (DUONEB) 0.5-2.5 (3) MG/3ML nebulizer solution 3 mL  3 mL Nebulization Q4H PRN Darel Hong D, NP      . isosorbide mononitrate (IMDUR) 24 hr tablet 30 mg  30 mg Oral Daily Fritzi Mandes, MD   30 mg at 08/10/18 1001  . labetalol (NORMODYNE) injection 20 mg  20 mg Intravenous Q6H PRN Darel Hong D, NP   20 mg at 08/09/18 0541  . levothyroxine (SYNTHROID) tablet 25  mcg  25 mcg Oral Q0600 Paticia Stack, [place]   25 mcg at 08/10/18 4098  .  lidocaine (PF) (XYLOCAINE) 1 % injection 5 mL  5 mL Intradermal PRN Lateef, Munsoor, MD      . lidocaine (XYLOCAINE) 2 % viscous mouth solution 15 mL  15 mL Mouth/Throat PRN Darel Hong D, NP   15 mL at 08/07/18 1636  . lidocaine-prilocaine (EMLA) cream 1 application  1 application Topical PRN Lateef, Munsoor, MD      . metoprolol succinate (TOPROL-XL) 24 hr tablet 25 mg  25 mg Oral Daily Seals, Angela H, NP   25 mg at 08/10/18 1001  . mometasone-formoterol (DULERA) 200-5 MCG/ACT inhaler 2 puff  2 puff Inhalation BID Darel Hong D, NP   2 puff at 08/10/18 1125  . ondansetron (ZOFRAN) injection 4 mg  4 mg Intravenous Q6H PRN Seals, Theo Dills, NP      . oxyCODONE-acetaminophen (PERCOCET/ROXICET) 5-325 MG per tablet 1 tablet  1 tablet Oral Q6H PRN Mansy, Jan A, MD   1 tablet at 08/10/18 0747   And  . oxyCODONE (Oxy IR/ROXICODONE) immediate release tablet 5 mg  5 mg Oral Q6H PRN Mansy, Jan A, MD   5 mg at 08/10/18 1254  . pentafluoroprop-tetrafluoroeth (GEBAUERS) aerosol 1 application  1 application Topical PRN Lateef, Munsoor, MD      . sodium chloride flush (NS) 0.9 % injection 10 mL  10 mL Intravenous Q12H Fritzi Mandes, MD   10 mL at 08/10/18 1005  . sodium chloride flush (NS) 0.9 % injection 3 mL  3 mL Intravenous Q12H Seals, Angela H, NP   3 mL at 08/09/18 0916  . sodium chloride flush (NS) 0.9 % injection 3 mL  3 mL Intravenous PRN Seals, Levada Dy H, NP      . temazepam (RESTORIL) capsule 15 mg  15 mg Oral QHS PRN Bradly Bienenstock, NP   15 mg at 08/07/18 2134  . tuberculin injection 5 Units  5 Units Intradermal Once Lateef, Munsoor, MD       Facility-Administered Medications Ordered in Other Encounters  Medication Dose Route Frequency Provider Last Rate Last Dose  . 0.9 %  sodium chloride infusion   Intravenous Once Lloyd Huger, MD      . 0.9 %  sodium chloride infusion   Intravenous Once Lloyd Huger, MD      . dexamethasone (DECADRON) 20 mg in sodium chloride 0.9 % 50 mL IVPB  20 mg Intravenous Once Lloyd Huger, MD      . dexamethasone (DECADRON) injection 10 mg  10 mg Intravenous Once Lloyd Huger, MD      . sodium chloride flush (NS) 0.9 % injection 10 mL  10 mL Intravenous PRN Lloyd Huger, MD   10 mL at 06/16/17 0854    REVIEW OF SYSTEMS:   [X]  denotes positive finding, [ ]  denotes negative finding Cardiac  Comments:  Chest pain or chest pressure:    Shortness of breath upon exertion:    Short of breath when lying flat:    Irregular heart rhythm:        Vascular    Pain in calf, thigh, or hip brought on by ambulation:    Pain in feet at night that wakes you up from your sleep:     Blood clot in your veins:    Leg swelling:         Pulmonary    Oxygen at home:    Productive cough:     Wheezing:         Neurologic  Sudden weakness in arms or legs:     Sudden numbness in arms or legs:     Sudden onset of difficulty speaking or slurred speech:    Temporary loss of vision in one eye:     Problems with dizziness:         Gastrointestinal    Blood in stool:      Vomited blood:         Genitourinary    Burning when urinating:     Blood in urine:        Psychiatric    Major depression:         Hematologic    Bleeding problems:    Problems with blood clotting too easily:        Skin    Rashes or ulcers:        Constitutional    Fever or chills:     PHYSICAL EXAM:   Vitals:   08/10/18 1637 08/10/18 1645 08/10/18 1700 08/10/18 1715  BP: (!) 194/111 (!) 192/115 (!) 198/117 (!) 194/120  Pulse: 88 88 86 88  Resp: (!) 26 18 (!) 23 18  Temp:      TempSrc:      SpO2: 100% 100% 100% 100%  Weight:      Height:        GENERAL: The patient is a well-nourished female, in no acute distress. The vital signs are documented above. CARDIAC: There is a regular rate and rhythm.  VASCULAR: palpable femoral pules PULMONARY: Nonlabored  respirations ABDOMEN: Soft and non-tender with normal pitched bowel sounds. No midline incisional hernia MUSCULOSKELETAL: There are no major deformities or cyanosis. NEUROLOGIC: No focal weakness or paresthesias are detected. SKIN: There are no ulcers or rashes noted. PSYCHIATRIC: The patient has a normal affect.  STUDIES:   none  ASSESSMENT and PLAN   Discussed plans for a temporary right femoral dialysis catheter.  Risks and benefits were discussed and she wishes to proceed   Leia Alf, MD, FACS [facility] [phone] Pager [phone]

## 2018-08-10 NOTE — Progress Notes (Signed)
Progress Note  Patient Name: Jessica Burgess Date of Encounter: 08/10/2018  Primary Cardiologist: No primary care provider on file.   Subjective   She is complaining of severe migraine headache.  Renal function is worse and the patient is getting hemodialysis catheter placement.  Inpatient Medications    Scheduled Meds: . atorvastatin  40 mg Oral q1800  . [START ON 08/11/2018] Chlorhexidine Gluconate Cloth  6 each Topical Q0600  . DULoxetine  60 mg Oral Daily  . ferrous sulfate  325 mg Oral Daily  . hydrALAZINE  25 mg Oral TID  . isosorbide mononitrate  30 mg Oral Daily  . levothyroxine  25 mcg Oral Q0600  . metoprolol succinate  25 mg Oral Daily  . mometasone-formoterol  2 puff Inhalation BID  . sodium chloride flush  10 mL Intravenous Q12H  . sodium chloride flush  3 mL Intravenous Q12H  . tuberculin  5 Units Intradermal Once   Continuous Infusions: . sodium chloride    . sodium chloride    . sodium chloride     PRN Meds: sodium chloride, sodium chloride, sodium chloride, acetaminophen, alteplase, cyclobenzaprine, fluticasone, heparin, hydrALAZINE, ipratropium-albuterol, labetalol, lidocaine (PF), lidocaine, lidocaine-prilocaine, ondansetron (ZOFRAN) IV, oxyCODONE-acetaminophen **AND** oxyCODONE, pentafluoroprop-tetrafluoroeth, sodium chloride flush, temazepam   Vital Signs    Vitals:   08/10/18 1444 08/10/18 1454 08/10/18 1504 08/10/18 1514  BP: (!) 175/101 (!) 183/106 (!) 183/100 (!) 183/102  Pulse: 84 84 83 87  Resp: 17 14 15 20   Temp:      TempSrc:      SpO2: 96% 95% 96% 97%  Weight:      Height:        Intake/Output Summary (Last 24 hours) at 08/10/2018 1625 Last data filed at 08/10/2018 1400 Gross per 24 hour  Intake 480 ml  Output 820 ml  Net -340 ml   Last 3 Weights 08/09/2018 08/08/2018 08/08/2018  Weight (lbs) 169 lb 6.4 oz 170 lb 3.2 oz 171 lb 8.3 oz  Weight (kg) 76.839 kg 77.202 kg 77.8 kg      Telemetry    Normal sinus rhythm- Personally  Reviewed  ECG    Not done today- Personally Reviewed  Physical Exam   GEN: No acute distress.   Neck:  Mild JVD Cardiac: RRR, no murmurs, rubs, or gallops.  Respiratory:  Few crackles at the base. GI: Soft, nontender, non-distended  MS: No edema; No deformity. Neuro:  Nonfocal  Psych: Normal affect   Labs    Chemistry Recent Labs  Lab 08/05/18 0829 08/06/18 2014  08/08/18 0211 08/09/18 0531 08/10/18 0539  NA 137 138   < > 137 137 134*  K 5.2* 5.5*   < > 5.1 5.0 4.4  CL 105 107   < > 104 104 103  CO2 24 20*   < > 22 23 22   GLUCOSE 119* 148*   < > 100* 106* 123*  BUN 59* 69*   < > 79* 89* 90*  CREATININE 3.20* 3.19*   < > 3.50* 3.76* 4.13*  CALCIUM 8.0* 8.3*   < > 8.0* 8.2* 7.9*  PROT 5.7* 6.5  --   --   --   --   ALBUMIN 2.9* 3.3*  --   --   --   --   AST 32 49*  --   --   --   --   ALT 21 22  --   --   --   --   ALKPHOS 61  66  --   --   --   --   BILITOT 0.9 1.2  --   --   --   --   GFRNONAA 15* 15*   < > 13* 12* 11*  GFRAA 17* 17*   < > 15* 14* 12*  ANIONGAP 8 11   < > 11 10 9    < > = values in this interval not displayed.     Hematology Recent Labs  Lab 08/06/18 2014 08/07/18 0340 08/08/18 0211  WBC 12.8* 13.0* 8.8  RBC 2.51* 2.53* 2.20*  HGB 8.6* 8.7* 7.5*  HCT 26.5* 27.2* 23.4*  MCV 105.6* 107.5* 106.4*  MCH 34.3* 34.4* 34.1*  MCHC 32.5 32.0 32.1  RDW 23.8* 24.0* 23.2*  PLT 58* 49* 49*    Cardiac Enzymes Recent Labs  Lab 08/06/18 2014 08/07/18 0340 08/07/18 0832 08/07/18 1507  TROPONINI 0.06* 1.10* 0.81* 0.35*   No results for input(s): TROPIPOC in the last 168 hours.   BNP Recent Labs  Lab 08/06/18 2015 08/07/18 0340  BNP 1,407.0* 2,727.0*     DDimer No results for input(s): DDIMER in the last 168 hours.   Radiology    Ct Chest Wo Contrast  Result Date: 08/09/2018 CLINICAL DATA:  Hemoptysis, ovarian cancer EXAM: CT CHEST WITHOUT CONTRAST TECHNIQUE: Multidetector CT imaging of the chest was performed following the standard  protocol without IV contrast. COMPARISON:  PET-CT, 05/07/2018, CT chest, 02/02/2018 FINDINGS: Cardiovascular: Cardiomegaly. Trace pericardial effusion. Right chest port catheter. Mediastinum/Nodes: No enlarged mediastinal, hilar, or axillary lymph nodes. Thyroid gland, trachea, and esophagus demonstrate no significant findings. Lungs/Pleura: There is scattered bilateral ground-glass and clustered nodular pulmonary opacity, most conspicuous in the bilateral upper lobes and new compared to prior PET-CT dated 05/07/2018 (series 3, image 58). There is a new 3 mm subpleural pulmonary nodule of the left pulmonary apex (series 3, image 23) and a new 4 mm subpleural nodule of the right middle lobe (series 3, image 70). There is a thin-walled pneumatocele unchanged from prior examination. Small bilateral pleural effusions and associated atelectasis or consolidation. Upper Abdomen: No acute abnormality. Musculoskeletal: No chest wall mass or suspicious bone lesions identified. IMPRESSION: 1. There is scattered bilateral ground-glass and clustered nodular pulmonary opacity, most conspicuous in the bilateral upper lobes and new compared to prior PET-CT dated 05/07/2018 (series 3, image 58). Small bilateral pleural effusions and associated atelectasis or consolidation. Findings are consistent with atypical infection or inflammation. Drug toxicity is a significant differential consideration in the setting of malignancy. 2. There is a new 3 mm subpleural pulmonary nodule of the left pulmonary apex (series 3, image 23) and a new 4 mm subpleural nodule of the right middle lobe (series 3, image 70). These are nonspecific and may be infectious or inflammatory although concerning for metastatic disease in the setting of primary ovarian malignancy. Attention on follow-up. Electronically Signed   By: Eddie Candle M.D.   On: 08/09/2018 12:25   Dg Abd Portable 1v  Result Date: 08/10/2018 CLINICAL DATA:  Dialysis catheter insertion.  EXAM: PORTABLE ABDOMEN - 1 VIEW COMPARISON:  Abdominal CT 08/06/2018. FINDINGS: Right femoral dialysis catheter extends to the L2-3 disc space level, corresponding with the infrarenal IVC on prior CT. The bowel gas pattern is normal. Patient is status post cholecystectomy and L4-5 fusion. IMPRESSION: Hemodialysis catheter tip at the L2-3 disc space level. Electronically Signed   By: Richardean Sale M.D.   On: 08/10/2018 15:35    Cardiac Studies   Echocardiogram during  this admission:   Echo: EF 40-45%, possible anterior/anteroseptal hypokinesis, normal RV  Patient Profile     63 y.o. female with history of ovarian cancer and gemcitabine with associated anemia and thrombocytopenia, chronic kidney disease, essential hypertension, asthma and migraine disorder who is being followed for acute systolic heart failure and acute on chronic kidney disease.  Assessment & Plan    1.  Acute systolic heart failure: EF is 40 to 45% by echo with anterior/anteroseptal hypokinesis.  There is a concern for ischemic cardiomyopathy given the wall motion abnormality.  However, the patient is currently not a Candidate given significant worsening of renal function above baseline.  Recommend continuing medical therapy.  In addition, the patient continues to be volume overloaded but diuresis has been complicated by worsening renal function. The patient will likely start RRT in the near future per nephrology.  2.  Acute on chronic kidney disease: The exact etiology of this is not entirely clear but the patient is currently getting work-up for renal pulmonary syndromes.  Some of the serologies still pending although ANA was positive.  Ideally, the patient should get kidney biopsy but I doubt that she is a candidate given degree of thrombocytopenia.  3.  Elevated troponin likely supply demand ischemia and as mentioned above currently not a cath candidate due to acute on chronic renal failure, thrombocytopenia and anemia.   4.  Ovarian cancer: Managed by oncology.      For questions or updates, please contact [place] Please consult [email or web address] for contact info under        Signed, Kathlyn Sacramento, MD  08/10/2018, 4:25 PM

## 2018-08-10 NOTE — Plan of Care (Signed)
  Problem: Education: Goal: Knowledge of General Education information will improve Description Including pain rating scale, medication(s)/side effects and non-pharmacologic comfort measures Outcome: Progressing   Problem: Health Behavior/Discharge Planning: Goal: Ability to manage health-related needs will improve Outcome: Progressing   Problem: Clinical Measurements: Goal: Ability to maintain clinical measurements within normal limits will improve Outcome: Progressing Goal: Will remain free from infection Outcome: Progressing Goal: Diagnostic test results will improve Outcome: Progressing Goal: Respiratory complications will improve Outcome: Progressing Goal: Cardiovascular complication will be avoided Outcome: Progressing   Problem: Activity: Goal: Risk for activity intolerance will decrease Outcome: Progressing   Problem: Coping: Goal: Level of anxiety will decrease Outcome: Progressing   Problem: Elimination: Goal: Will not experience complications related to bowel motility Outcome: Progressing Goal: Will not experience complications related to urinary retention Outcome: Progressing   Problem: Pain Managment: Goal: General experience of comfort will improve Outcome: Progressing   Problem: Safety: Goal: Ability to remain free from injury will improve Outcome: Progressing   Problem: Skin Integrity: Goal: Risk for impaired skin integrity will decrease Outcome: Progressing   Problem: Education: Goal: Ability to demonstrate management of disease process will improve Outcome: Progressing Goal: Ability to verbalize understanding of medication therapies will improve Outcome: Progressing   Problem: Activity: Goal: Capacity to carry out activities will improve Outcome: Progressing   Problem: Cardiac: Goal: Ability to achieve and maintain adequate cardiopulmonary perfusion will improve Outcome: Progressing

## 2018-08-10 NOTE — Progress Notes (Signed)
[facility] [place] NAME: Jessica Burgess    MR#:  [id number]  DATE OF BIRTH:  [birth year]  SUBJECTIVE:   Doing ok. Feels face is puffy. Still very sob with exertion REVIEW OF SYSTEMS:   Review of Systems  Constitutional: Negative for chills, fever and weight loss.  HENT: Negative for ear discharge, ear pain and nosebleeds.   Eyes: Negative for blurred vision, pain and discharge.  Respiratory: Positive for shortness of breath. Negative for sputum production, wheezing and stridor.   Cardiovascular: Negative for chest pain, palpitations, orthopnea and PND.  Gastrointestinal: Negative for abdominal pain, diarrhea, nausea and vomiting.  Genitourinary: Negative for frequency and urgency.  Musculoskeletal: Negative for back pain and joint pain.  Neurological: Positive for weakness. Negative for sensory change, speech change and focal weakness.  Psychiatric/Behavioral: Negative for depression and hallucinations. The patient is not nervous/anxious.    Tolerating Diet:yes Tolerating PT: ambulatory  DRUG ALLERGIES:  No Known Allergies  VITALS:  Blood pressure (!) 147/84, pulse 84, temperature 99 F (37.2 C), temperature source Oral, resp. rate 20, height 5\' 2"  (1.575 m), weight 76.8 kg, SpO2 97 %.  PHYSICAL EXAMINATION:   Physical Exam  GENERAL:  63 y.o.-year-old patient lying in the bed with no acute distress.  EYES: Pupils equal, round, reactive to light and accommodation. No scleral icterus. Extraocular muscles intact.  HEENT: Head atraumatic, normocephalic. Oropharynx and nasopharynx clear. Puffy face NECK:  Supple, no jugular venous distention. No thyroid enlargement, no tenderness.  LUNGS: priest breath sounds bilaterally, no wheezing, few rales,no rhonchi. No use of accessory muscles of respiration.  CARDIOVASCULAR: S1, S2 normal. No murmurs, rubs, or gallops.  ABDOMEN: Soft, nontender, nondistended. Bowel sounds present. No  organomegaly or mass.  EXTREMITIES: No cyanosis, clubbing or edema b/l.    NEUROLOGIC: Cranial nerves II through XII are intact. No focal Motor or sensory deficits b/l.   PSYCHIATRIC:  patient is alert and oriented x 3.  SKIN: No obvious rash, lesion, or ulcer.   LABORATORY PANEL:  CBC Recent Labs  Lab 08/08/18 0211  WBC 8.8  HGB 7.5*  HCT 23.4*  PLT 49*    Chemistries  Recent Labs  Lab 08/06/18 2014  08/10/18 0539  NA 138   < > 134*  K 5.5*   < > 4.4  CL 107   < > 103  CO2 20*   < > 22  GLUCOSE 148*   < > 123*  BUN 69*   < > 90*  CREATININE 3.19*   < > 4.13*  CALCIUM 8.3*   < > 7.9*  AST 49*  --   --   ALT 22  --   --   ALKPHOS 66  --   --   BILITOT 1.2  --   --    < > = values in this interval not displayed.   Cardiac Enzymes Recent Labs  Lab 08/07/18 1507  TROPONINI 0.35*   RADIOLOGY:  Ct Chest Wo Contrast  Result Date: 08/09/2018 CLINICAL DATA:  Hemoptysis, ovarian cancer EXAM: CT CHEST WITHOUT CONTRAST TECHNIQUE: Multidetector CT imaging of the chest was performed following the standard protocol without IV contrast. COMPARISON:  PET-CT, 05/07/2018, CT chest, 02/02/2018 FINDINGS: Cardiovascular: Cardiomegaly. Trace pericardial effusion. Right chest port catheter. Mediastinum/Nodes: No enlarged mediastinal, hilar, or axillary lymph nodes. Thyroid gland, trachea, and esophagus demonstrate no significant findings. Lungs/Pleura: There is scattered bilateral ground-glass and clustered nodular pulmonary opacity, most conspicuous in the bilateral  upper lobes and new compared to prior PET-CT dated 05/07/2018 (series 3, image 58). There is a new 3 mm subpleural pulmonary nodule of the left pulmonary apex (series 3, image 23) and a new 4 mm subpleural nodule of the right middle lobe (series 3, image 70). There is a thin-walled pneumatocele unchanged from prior examination. Small bilateral pleural effusions and associated atelectasis or consolidation. Upper Abdomen: No acute  abnormality. Musculoskeletal: No chest wall mass or suspicious bone lesions identified. IMPRESSION: 1. There is scattered bilateral ground-glass and clustered nodular pulmonary opacity, most conspicuous in the bilateral upper lobes and new compared to prior PET-CT dated 05/07/2018 (series 3, image 58). Small bilateral pleural effusions and associated atelectasis or consolidation. Findings are consistent with atypical infection or inflammation. Drug toxicity is a significant differential consideration in the setting of malignancy. 2. There is a new 3 mm subpleural pulmonary nodule of the left pulmonary apex (series 3, image 23) and a new 4 mm subpleural nodule of the right middle lobe (series 3, image 70). These are nonspecific and may be infectious or inflammatory although concerning for metastatic disease in the setting of primary ovarian malignancy. Attention on follow-up. Electronically Signed   By: Eddie Candle M.D.   On: 08/09/2018 12:25   ASSESSMENT AND PLAN:   Jessica Burgess  is a 63 y.o. female with a known history of ovarian cancer, CKD, hypothyroidism, hypertension and anemia.  Patient is currently undergoing chemotherapy followed by Dr. Mellody Drown and Dr. Grayland Ormond. Presented to the emergency room with a 2-day history of increased shortness of breath and nonproductive cough.  Shortness of breath is made worse when lying flat.    1.  Acute diastolic CHF-BNP 1287 - Patient started on Lasix 40 mg IV twice daily-- on daily lasix IV 40 mg-- Lasix held given elevated creatinine - echocardiogram  left ventricle has mildly reduced systolic function, with an ejection fraction of 45-50%. The cavity size was normal. There is mildly increased left ventricular wall thickness. Concern for anterior/anteroseptal wall hypokinesis -was  Placed on BiPAP therapy for increased work of breathing--now on RA sats 96% - appreciate cardiology input from Drs gollan/Mclean/Arida -added hydralazine and imdur per  cardiology recommendation for after load reduction -HD to be started for cardiorenal syndorme  2.  Acute on chronic renal failure-III - We will continue to monitor renal function closely particularly given initiation of diuretics for acute CHF -?cardiorenal syndrome -creat baslien 1.7 -creat 3.20--3.19--3.4--3.76--4.19 -pending renal labs ANCA,GBM,ENA,DNA/DS, AntiCH,centro, [name] -HD to be started today after HD cath placement  3.  Hyperkalemia - Potassium level is 5.5.  4.  High-grade ovarian cancer - Patient is status post chemotherapy with gemcitabine - seen by  Dr. Grayland Ormond   5.  Lactic acidosis- 3.4 lactic acid level--3.1--2.0 - Will repeat lactic acid level as patient is diuresed.  No other evidence of infectious process.  Blood, urine, and sputum cultures are pending.  Will treat as indicated  6.  Hypothyroidism - Synthroid continued  7. Anemia of chronic dz -recently received 2 units BT at the cancer center  8. TCP suspected due to chemo stable at 49K  DVT and PPI prophylaxis have been initiated.    CODE STATUS: full  DVT Prophylaxis: heparin  TOTAL TIME TAKING CARE OF THIS PATIENT: *30* minutes.  >50% time spent on counselling and coordination of care  POSSIBLE D/C IN *?* DAYS, DEPENDING ON CLINICAL CONDITION.  Note: This dictation was prepared with Dragon dictation along with smaller phrase technology. Any transcriptional  errors that result from this process are unintentional.  Fritzi Mandes M.D on 08/10/2018 at 1:44 PM  Between 7am to 6pm - Pager - [phone]  After 6pm go to [email or web address] - password [email or web address] [facility]  Office  [phone]  CC: Primary care physician; Steele Sizer, MDPatient ID: Jessica Burgess, female   DOB: 1955/08/19, 63 y.o.   MRN: [id number]

## 2018-08-10 NOTE — Progress Notes (Signed)
Pre HD Assessment    08/10/18 1607  Neurological  Level of Consciousness Alert  Orientation Level Oriented X4  Respiratory  Respiratory Pattern Labored;Regular  Chest Assessment Chest expansion symmetrical  Bilateral Breath Sounds Diminished  Cough None  Cardiac  Pulse Regular  Heart Sounds S1, S2  ECG Monitor Yes  Cardiac Rhythm NSR  Vascular  R Radial Pulse +2  L Radial Pulse +2  Psychosocial  Psychosocial (WDL) X  Patient Behaviors Anxious (Pt states she bgan having panic attacks during this hospital)

## 2018-08-10 NOTE — Progress Notes (Signed)
Post HD Tx    08/10/18 1745  Hand-Off documentation  Report given to (Full Name) Georg Ruddle, RN   Report received from (Full Name) Beatris Ship, RN   Vital Signs  Temp 98.7 F (37.1 C)  Temp Source Oral  Pulse Rate 88  Pulse Rate Source Monitor  Resp 20  BP (!) 212/119  BP Location Left Arm  BP Method Automatic  Patient Position (if appropriate) Lying  Oxygen Therapy  SpO2 100 %  O2 Device Nasal Cannula  O2 Flow Rate (L/min) 2 L/min  Pulse Oximetry Type Continuous  Pain Assessment  Pain Scale 0-10  Pain Score 0  Dialysis Weight  Weight 76.2 kg  Type of Weight Post-Dialysis  Hemodialysis Catheter Right Femoral vein Double-lumen  Placement Date/Time: 08/10/18 1514   Placed prior to admission: No  Time Out: Correct patient;Correct procedure;Correct site  Maximum sterile barrier precautions: Hand hygiene;Cap;Mask;Sterile gown;Sterile gloves;Large sterile sheet  Site Prep: Chlorh...  Site Condition No complications  Blue Lumen Status Heparin locked  Red Lumen Status Heparin locked  Post treatment catheter status Capped and Clamped

## 2018-08-11 ENCOUNTER — Inpatient Hospital Stay: Payer: PPO | Admitting: Oncology

## 2018-08-11 ENCOUNTER — Other Ambulatory Visit: Payer: Self-pay

## 2018-08-11 ENCOUNTER — Inpatient Hospital Stay: Payer: PPO

## 2018-08-11 DIAGNOSIS — F411 Generalized anxiety disorder: Secondary | ICD-10-CM

## 2018-08-11 DIAGNOSIS — G4700 Insomnia, unspecified: Secondary | ICD-10-CM | POA: Diagnosis not present

## 2018-08-11 DIAGNOSIS — F329 Major depressive disorder, single episode, unspecified: Secondary | ICD-10-CM | POA: Diagnosis not present

## 2018-08-11 DIAGNOSIS — Z992 Dependence on renal dialysis: Secondary | ICD-10-CM | POA: Diagnosis not present

## 2018-08-11 DIAGNOSIS — R0602 Shortness of breath: Secondary | ICD-10-CM | POA: Diagnosis not present

## 2018-08-11 DIAGNOSIS — I5021 Acute systolic (congestive) heart failure: Secondary | ICD-10-CM | POA: Diagnosis not present

## 2018-08-11 DIAGNOSIS — N186 End stage renal disease: Secondary | ICD-10-CM | POA: Diagnosis not present

## 2018-08-11 LAB — BASIC METABOLIC PANEL
Anion gap: 12 (ref 5–15)
BUN: 72 mg/dL — ABNORMAL HIGH (ref 8–23)
CO2: 23 mmol/L (ref 22–32)
Calcium: 8.1 mg/dL — ABNORMAL LOW (ref 8.9–10.3)
Chloride: 102 mmol/L (ref 98–111)
Creatinine, Ser: 3.46 mg/dL — ABNORMAL HIGH (ref 0.44–1.00)
GFR calc Af Amer: 15 mL/min — ABNORMAL LOW (ref >60)
GFR calc non Af Amer: 13 mL/min — ABNORMAL LOW (ref >60)
Glucose, Bld: 96 mg/dL (ref 70–99)
Potassium: 4.4 mmol/L (ref 3.5–5.1)
Sodium: 137 mmol/L (ref 135–145)

## 2018-08-11 LAB — PROTEIN ELECTROPHORESIS, SERUM
A/G Ratio: 1.2 (ref 0.7–1.7)
Albumin ELP: 3.2 g/dL (ref 2.9–4.4)
Alpha-1-Globulin: 0.4 g/dL (ref 0.0–0.4)
Alpha-2-Globulin: 0.6 g/dL (ref 0.4–1.0)
Beta Globulin: 1.1 g/dL (ref 0.7–1.3)
Gamma Globulin: 0.6 g/dL (ref 0.4–1.8)
Globulin, Total: 2.7 g/dL (ref 2.2–3.9)
Total Protein ELP: 5.9 g/dL — ABNORMAL LOW (ref 6.0–8.5)

## 2018-08-11 LAB — MPO/PR-3 (ANCA) ANTIBODIES
ANCA Proteinase 3: <3.5 U/mL (ref 0.0–3.5)
Myeloperoxidase Abs: <9 U/mL (ref 0.0–9.0)

## 2018-08-11 MED ORDER — CLONAZEPAM 0.5 MG PO TABS
0.5000 mg | ORAL_TABLET | Freq: Every day | ORAL | Status: DC
  Administered 2018-08-11 – 2018-08-12 (×2): 0.5 mg via ORAL
  Filled 2018-08-11 (×2): qty 1

## 2018-08-11 MED ORDER — METOPROLOL SUCCINATE ER 50 MG PO TB24
50.0000 mg | ORAL_TABLET | Freq: Every day | ORAL | Status: DC
  Administered 2018-08-11 – 2018-08-18 (×6): 50 mg via ORAL
  Filled 2018-08-11 (×6): qty 1

## 2018-08-11 MED ORDER — ALPRAZOLAM 0.25 MG PO TABS
0.2500 mg | ORAL_TABLET | Freq: Once | ORAL | Status: AC
  Administered 2018-08-11: 0.25 mg via ORAL
  Filled 2018-08-11: qty 1

## 2018-08-11 NOTE — Progress Notes (Addendum)
[facility] [place] NAME: Jessica Burgess    MR#:  [id number]  DATE OF BIRTH:  [birth year]  SUBJECTIVE:   Doing ok. Feels face is puffy. Dealing with lot of anxiety due to her recent health issues REVIEW OF SYSTEMS:   Review of Systems  Constitutional: Negative for chills, fever and weight loss.  HENT: Negative for ear discharge, ear pain and nosebleeds.   Eyes: Negative for blurred vision, pain and discharge.  Respiratory: Positive for shortness of breath. Negative for sputum production, wheezing and stridor.   Cardiovascular: Negative for chest pain, palpitations, orthopnea and PND.  Gastrointestinal: Negative for abdominal pain, diarrhea, nausea and vomiting.  Genitourinary: Negative for frequency and urgency.  Musculoskeletal: Negative for back pain and joint pain.  Neurological: Positive for weakness. Negative for sensory change, speech change and focal weakness.  Psychiatric/Behavioral: Negative for depression and hallucinations. The patient is not nervous/anxious.    Tolerating Diet:yes Tolerating PT: ambulatory  DRUG ALLERGIES:  No Known Allergies  VITALS:  Blood pressure (!) 167/90, pulse 91, temperature 98.4 F (36.9 C), temperature source Oral, resp. rate 18, height 5\' 2"  (1.575 m), weight 75.9 kg, SpO2 95 %.  PHYSICAL EXAMINATION:   Physical Exam  GENERAL:  63 y.o.-year-old patient lying in the bed with no acute distress.  EYES: Pupils equal, round, reactive to light and accommodation. No scleral icterus. Extraocular muscles intact.  HEENT: Head atraumatic, normocephalic. Oropharynx and nasopharynx clear. Puffy face NECK:  Supple, no jugular venous distention. No thyroid enlargement, no tenderness.  LUNGS: priest breath sounds bilaterally, no wheezing, few rales,no rhonchi. No use of accessory muscles of respiration.  CARDIOVASCULAR: S1, S2 normal. No murmurs, rubs, or gallops.  ABDOMEN: Soft, nontender,  nondistended. Bowel sounds present. No organomegaly or mass.  EXTREMITIES: No cyanosis, clubbing or edema b/l.    NEUROLOGIC: Cranial nerves II through XII are intact. No focal Motor or sensory deficits b/l.   PSYCHIATRIC:  patient is alert and oriented x 3.  SKIN: No obvious rash, lesion, or ulcer.   LABORATORY PANEL:  CBC Recent Labs  Lab 08/08/18 0211  WBC 8.8  HGB 7.5*  HCT 23.4*  PLT 49*    Chemistries  Recent Labs  Lab 08/06/18 2014  08/11/18 0407  NA 138   < > 137  K 5.5*   < > 4.4  CL 107   < > 102  CO2 20*   < > 23  GLUCOSE 148*   < > 96  BUN 69*   < > 72*  CREATININE 3.19*   < > 3.46*  CALCIUM 8.3*   < > 8.1*  AST 49*  --   --   ALT 22  --   --   ALKPHOS 66  --   --   BILITOT 1.2  --   --    < > = values in this interval not displayed.   Cardiac Enzymes Recent Labs  Lab 08/07/18 1507  TROPONINI 0.35*   RADIOLOGY:  Dg Abd Portable 1v  Result Date: 08/10/2018 CLINICAL DATA:  Dialysis catheter insertion. EXAM: PORTABLE ABDOMEN - 1 VIEW COMPARISON:  Abdominal CT 08/06/2018. FINDINGS: Right femoral dialysis catheter extends to the L2-3 disc space level, corresponding with the infrarenal IVC on prior CT. The bowel gas pattern is normal. Patient is status post cholecystectomy and L4-5 fusion. IMPRESSION: Hemodialysis catheter tip at the L2-3 disc space level. Electronically Signed   By: Caryl Comes.D.  On: 08/10/2018 15:35   ASSESSMENT AND PLAN:   Jessica Burgess  is a 63 y.o. female with a known history of ovarian cancer, CKD, hypothyroidism, hypertension and anemia.  Patient is currently undergoing chemotherapy followed by Dr. Mellody Drown and Dr. Grayland Ormond. Presented to the emergency room with a 2-day history of increased shortness of breath and nonproductive cough.  Shortness of breath is made worse when lying flat.    1.  Acute diastolic CHF-BNP 8366 - Patient started on Lasix 40 mg IV twice daily-- on daily lasix IV 40 mg-- Lasix held given  elevated creatinine - echocardiogram  left ventricle has mildly reduced systolic function, with an ejection fraction of 45-50%. The cavity size was normal. There is mildly increased left ventricular wall thickness. Concern for anterior/anteroseptal wall hypokinesis -was  Placed on BiPAP therapy for increased work of breathing--now on RA sats 96% - appreciate cardiology input from Drs gollan/Mclean/Arida -added hydralazine and imdur per cardiology recommendation for after load reduction -HD to be started for cardiorenal syndorme  2.  Acute on chronic renal failure-III - We will continue to monitor renal function closely particularly given initiation of diuretics for acute CHF -?cardiorenal syndrome -creat baslien 1.7 -creat 3.20--3.19--3.4--3.76--4.19--HD started (08/11/2018) --3.4 -pending renal labs ANCA,GBM,ENA,DNA/DS, AntiCH,centro, Jo  3.  Hyperkalemia - Potassium level is 5.5.  4.  High-grade ovarian cancer - Patient is status post chemotherapy with gemcitabine - seen by  Dr. Grayland Ormond   5.  Lactic acidosis- 3.4 lactic acid level--3.1--2.0 - Will repeat lactic acid level as patient is diuresed.  No other evidence of infectious process.  Blood, urine, and sputum cultures are pending.  Will treat as indicated  6.  Hypothyroidism - Synthroid continued  7. Anemia of chronic dz -recently received 2 units BT at the cancer center  8. TCP suspected due to chemo stable at 49K  9. Anxiety spells -patient has history of previous anxiety and depression in the past. Psych consult placed with Dr. Leverne Humbles -patient's PCP message me last night to see if he can get help for her anxiety while she is here in the hospital given all the health issues she is undergoing at present.  DVT and PPI prophylaxis have been initiated.    CODE STATUS: full  DVT Prophylaxis: heparin  TOTAL TIME TAKING CARE OF THIS PATIENT: *30* minutes.  >50% time spent on counselling and coordination of  care  POSSIBLE D/C IN *?* DAYS, DEPENDING ON CLINICAL CONDITION.  Note: This dictation was prepared with Dragon dictation along with smaller phrase technology. Any transcriptional errors that result from this process are unintentional.  Fritzi Mandes M.D on 08/11/2018 at 2:45 PM  Between 7am to 6pm - Pager - [phone]  After 6pm go to [email or web address] - password [email or web address] [facility]  Office  [phone]  CC: Primary care physician; Steele Sizer, MDPatient ID: [name], female   DOB: 06/11/1955, 63 y.o.   MRN: [id number]

## 2018-08-11 NOTE — Progress Notes (Signed)
Post HD Assessment  Tolerated tx well -- no fluid removal today per MD.    08/11/18 1227  Neurological  Level of Consciousness Alert  Orientation Level Oriented X4  Respiratory  Respiratory Pattern Dyspnea with exertion  Chest Assessment Chest expansion symmetrical  Bilateral Breath Sounds Diminished  Cardiac  Pulse Regular  ECG Monitor Yes  Cardiac Rhythm NSR  Vascular  R Radial Pulse +2  L Radial Pulse +2  Integumentary  Integumentary (WDL) X  Skin Color  (HD pt - cath exit site (also cancer pt / port))  Musculoskeletal  Musculoskeletal (WDL) WDL  Gastrointestinal  Bowel Sounds Assessment Active  GU Assessment  Genitourinary (WDL) X  Genitourinary Symptoms  (new HD pt)  Psychosocial  Psychosocial (WDL) X  Patient Behaviors Cooperative;Anxious  Needs Expressed Emotional  Emotional support given Given to patient

## 2018-08-11 NOTE — Progress Notes (Signed)
Progress Note  Patient Name: Jessica Burgess Date of Encounter: 08/11/2018  Primary Cardiologist: new to [facility] - consult by Rockey Situ  Subjective   She underwent dialysis catheter placement on 5/25 secondary to worsening renal function followed by dialysis with slight improvement in renal function noted this morning.   SOB improving this morning. Able to lay supine without issues. No chest pain or palpitations.   Inpatient Medications    Scheduled Meds: . atorvastatin  40 mg Oral q1800  . Chlorhexidine Gluconate Cloth  6 each Topical Q0600  . DULoxetine  60 mg Oral Daily  . ferrous sulfate  325 mg Oral Daily  . hydrALAZINE  25 mg Oral TID  . isosorbide mononitrate  30 mg Oral Daily  . levothyroxine  25 mcg Oral Q0600  . metoprolol succinate  25 mg Oral Daily  . mometasone-formoterol  2 puff Inhalation BID  . sodium chloride flush  10 mL Intravenous Q12H  . sodium chloride flush  3 mL Intravenous Q12H  . tuberculin  5 Units Intradermal Once   Continuous Infusions: . sodium chloride     PRN Meds: sodium chloride, acetaminophen, cyclobenzaprine, fluticasone, hydrALAZINE, ipratropium-albuterol, labetalol, lidocaine, ondansetron (ZOFRAN) IV, oxyCODONE-acetaminophen **AND** oxyCODONE, sodium chloride flush, temazepam   Vital Signs    Vitals:   08/10/18 1745 08/10/18 1806 08/10/18 1925 08/11/18 0404  BP: (!) 212/119 (!) 208/126 (!) 177/110 (!) 172/97  Pulse: 88 91 86 94  Resp: 20 20    Temp: 98.7 F (37.1 C) (!) 97.4 F (36.3 C) 98.5 F (36.9 C) 98.7 F (37.1 C)  TempSrc: Oral Oral Oral Oral  SpO2: 100% 95% 92% 95%  Weight: 76.2 kg 76.4 kg  76 kg  Height:        Intake/Output Summary (Last 24 hours) at 08/11/2018 0738 Last data filed at 08/11/2018 0405 Gross per 24 hour  Intake 240 ml  Output 1270 ml  Net -1030 ml   Filed Weights   08/10/18 1745 08/10/18 1806 08/11/18 0404  Weight: 76.2 kg 76.4 kg 76 kg    Telemetry    NSR - Personally Reviewed  ECG     n/a - Personally Reviewed  Physical Exam   GEN: No acute distress.   Neck: No JVD. Cardiac: RRR, no murmurs, rubs, or gallops.  Respiratory: Clear to auscultation bilaterally.  GI: Soft, nontender, non-distended.   MS: No edema; No deformity. Neuro:  Alert and oriented x 3; Nonfocal.  Psych: Normal affect.  Labs    Chemistry Recent Labs  Lab 08/05/18 0829 08/06/18 2014  08/09/18 0531 08/10/18 0539 08/11/18 0407  NA 137 138   < > 137 134* 137  K 5.2* 5.5*   < > 5.0 4.4 4.4  CL 105 107   < > 104 103 102  CO2 24 20*   < > 23 22 23   GLUCOSE 119* 148*   < > 106* 123* 96  BUN 59* 69*   < > 89* 90* 72*  CREATININE 3.20* 3.19*   < > 3.76* 4.13* 3.46*  CALCIUM 8.0* 8.3*   < > 8.2* 7.9* 8.1*  PROT 5.7* 6.5  --   --   --   --   ALBUMIN 2.9* 3.3*  --   --   --   --   AST 32 49*  --   --   --   --   ALT 21 22  --   --   --   --   [facility]  61 66  --   --   --   --   BILITOT 0.9 1.2  --   --   --   --   GFRNONAA 15* 15*   < > 12* 11* 13*  GFRAA 17* 17*   < > 14* 12* 15*  ANIONGAP 8 11   < > 10 9 12    < > = values in this interval not displayed.     Hematology Recent Labs  Lab 08/06/18 2014 08/07/18 0340 08/08/18 0211  WBC 12.8* 13.0* 8.8  RBC 2.51* 2.53* 2.20*  HGB 8.6* 8.7* 7.5*  HCT 26.5* 27.2* 23.4*  MCV 105.6* 107.5* 106.4*  MCH 34.3* 34.4* 34.1*  MCHC 32.5 32.0 32.1  RDW 23.8* 24.0* 23.2*  PLT 58* 49* 49*    Cardiac Enzymes Recent Labs  Lab 08/06/18 2014 08/07/18 0340 08/07/18 0832 08/07/18 1507  TROPONINI 0.06* 1.10* 0.81* 0.35*   No results for input(s): TROPIPOC in the last 168 hours.   BNP Recent Labs  Lab 08/06/18 2015 08/07/18 0340  BNP 1,407.0* 2,727.0*     DDimer No results for input(s): DDIMER in the last 168 hours.   Radiology    Ct Chest Wo Contrast  Result Date: 08/09/2018 IMPRESSION: 1. There is scattered bilateral ground-glass and clustered nodular pulmonary opacity, most conspicuous in the bilateral upper lobes and new compared  to prior PET-CT dated 05/07/2018 (series 3, image 58). Small bilateral pleural effusions and associated atelectasis or consolidation. Findings are consistent with atypical infection or inflammation. Drug toxicity is a significant differential consideration in the setting of malignancy. 2. There is a new 3 mm subpleural pulmonary nodule of the left pulmonary apex (series 3, image 23) and a new 4 mm subpleural nodule of the right middle lobe (series 3, image 70). These are nonspecific and may be infectious or inflammatory although concerning for metastatic disease in the setting of primary ovarian malignancy. Attention on follow-up. Electronically Signed   By: Eddie Candle M.D.   On: 08/09/2018 12:25   Dg Abd Portable 1v  Result Date: 08/10/2018 IMPRESSION: Hemodialysis catheter tip at the L2-3 disc space level. Electronically Signed   By: Richardean Sale M.D.   On: 08/10/2018 15:35    Cardiac Studies   2D Echo 08/07/2018: 1. Challenging images  2. The left ventricle has mildly reduced systolic function, with an ejection fraction of 40 to 45%. The cavity size was normal. There is mild to moderately increased left ventricular wall thickness. Concern for anterior/anteroseptal wall hypokinesis on select images, though difficult to visualize. Left ventricular diastolic doppler parameters are consistent with impaired relaxation.  3. The right ventricle has normal systolic function. The cavity was normal. There is no increase in right ventricular wall thickness.Unable to estimate RVSP  4. Left atrial size was mild-moderately dilated.  5. Aortic valve regurgitation is mild to moderate by color flow Doppler.  6. Trivial pericardial effusion is present.  Patient Profile     63 y.o. female with history of ovarian cancer and gemcitabine with associated anemia and thrombocytopenia, chronic kidney disease, essential hypertension, asthma and migraine disorder who is being followed for acute systolic heart failure  and acute on chronic kidney disease.  Assessment & Plan    1. Acute systolic CHF: -Volume now managed by HD given ARF -Echo with EF of 45% with anterior/anteroseptal HK -Concern for ICM -Ideally, she needs a cath, though not currently a candidate given her ARFm anemia, and thrombocytopenia. This should be considered in the  future if able -Continue Imdur/hydralazine -Increase Toprol to 50 mg daily -Not currently on ACEi/ARB/MRA secondary to ARF  2. Acute on CKD stage II: -Uncertain etiology, concern for possible renal-pulmonary syndromes -Ideally, she would benefit from renal biopsy, though her anemia and thrombocytopenia may limit this -HD per nephrology   3. Elevated troponin: -Felt to be secondary to supply demand ischemia in the setting of the above as well as anemia -Ideally, needs a cath when/if able as above  5. Ovarian cancer: -Per oncology   6. Anemia/thrombocytopenia: -Her dyspnea certainly may be exacerbated by her anemia -Transfuse as needed per IM  For questions or updates, please contact [place] Please consult [email or web address] for contact info under Cardiology/STEMI.    Signed, Christell Faith, PA-C [place] Pager: [phone] 08/11/2018, 7:38 AM

## 2018-08-11 NOTE — Plan of Care (Signed)

## 2018-08-11 NOTE — Consult Note (Signed)
[facility] Face-to-Face Psychiatry Consult   Reason for Consult:  Anxiety Referring Physician:  Dr. Posey Pronto Patient Identification: Jessica Burgess MRN:  [id number] Principal Diagnosis: ESRD on dialysis [facility]) Diagnosis:  Principal Problem:   ESRD on dialysis [facility]) Active Problems:   Insomnia, persistent   Major depression, chronic ([place])   Generalized anxiety disorder   Acute CHF ([place])   Acute CHF (congestive heart failure) ([place])  Patient is seen, chart is reviewed Total Time spent with patient: 1 hour  Subjective:  "I have anxiety"  HPI:  Jessica Burgess is a 63 y.o. female patient with a known history of ovarian cancer, CKD, hypothyroidism, hypertension and anemia.  Patient is currently undergoing chemotherapy followed by Dr. Mellody Drown and Dr. Grayland Ormond.  Patient reports having had chemotherapy infusion on yesterday however over the last 3 days she has received infusion of packed red blood cells as well as 2 L IV fluids according to the patient report.  She now presents to the emergency room with a 2-day history of increased shortness of breath and nonproductive cough.  Shortness of breath is made worse when lying flat.  She denies edema.  She denies chest pain.  She denies abdominal pain.  Patient denies fevers, chills, nausea, vomiting, or diarrhea.  Patient denies a prior known history of CHF.  However she is aware of renal failure occurring since March 2020.  CT abdomen was completed which demonstrated no evidence of hydronephrosis or urinary stone disease.  Chest x-ray demonstrated no significant interval change in cardiomegaly with mild vascular congestion and diffuse interstitial opacity.  No pleural effusion or pneumothorax identified.  However, patient developed increased shortness of breath while in the emergency room and was therefore placed on BiPAP therapy to decrease work of breathing.  Psychiatry consult is requested for evaluation and assistance with management of anxiety.  On  evaluation, patient is calm and cooperative.  She is alert and oriented x4.  Patient describes her mood as stable, with ups and downs related to her cancer diagnosis in 2018.  She expresses that she has developed congestive heart failure and recently renal failure, "secondary to bad judgment.  I should have gotten the blood transfusion, but should not have gotten the extra fluids that worsened my heart failure."  Patient expresses usual level of anxiety while hospitalized.  She states that she has more anxiety at home.  Patient lives with a roommate, and she describes increasing anxiety about 1 hour before when room-mate comes home because her room-mate complains about work not getting done at the house.  Patient reports that she is "happy" in the morning and through the day.  She states she falls asleep easily with Restoril, but it  wears off at 3 AM.  Patient denies SI, HI, AVH.  She denies history of mania or psychosis.  Past Psychiatric History: Depression, anxiety, insomnia  Risk to Self:  no Risk to Others:  no Prior Inpatient Therapy:  none Prior Outpatient Therapy:  none  Past Medical History:  Past Medical History:  Diagnosis Date  . Allergic rhinitis, cause unspecified   . Anxiety state, unspecified   . Arthritis   . Asthma    only when sick   . Backache, unspecified   . Bronchitis    hx of when get sick  . Cancer ([place])    skin cancer , basal cell   . Cancer ([place]) 11/2016   ovarian  . Cervicalgia   . Complication of anesthesia   . Dermatophytosis  of nail   . Dysmetabolic syndrome X   . Encounter for long-term (current) use of other medications   . Esophageal reflux   . Insomnia, unspecified   . Leukocytosis, unspecified   . Migraine without aura, without mention of intractable migraine without mention of status migrainosus   . Other and unspecified hyperlipidemia   . Other malaise and fatigue   . Overweight(278.02)   . Personal history of chemotherapy now   ovarian  .  PONV (postoperative nausea and vomiting)   . Spinal stenosis in cervical region   . Symptomatic menopausal or female climacteric states   . Unspecified disorder of skin and subcutaneous tissue   . Unspecified vitamin D deficiency     Past Surgical History:  Procedure Laterality Date  . ABDOMINAL HYSTERECTOMY    . ANTERIOR CERVICAL DECOMP/DISCECTOMY FUSION N/A 06/07/2015   Procedure: Cervical three - four and Cervical six- seven anterior cervical decompression with fusion interbody prosthesis plating and bonegraft;  Surgeon: Newman Pies, MD;  Location: [facility];  Service: Neurosurgery;  Laterality: N/A;  C34 and C67 anterior cervical decompression with fusion interbody prosthesis plating and bonegraft  . BACK SURGERY     x2 Lower   . EVACUATION OF CERVICAL HEMATOMA N/A 06/14/2015   Procedure: EVACUATION OF CERVICAL HEMATOMA;  Surgeon: Newman Pies, MD;  Location: [facility];  Service: Neurosurgery;  Laterality: N/A;  . NECK SURGERY     x3  . TONSILLECTOMY     Family History:  Family History  Problem Relation Age of Onset  . Depression Mother   . Migraines Mother   . Dementia Father   . Diabetes Father   . Hyperlipidemia Father   . Hyperlipidemia Brother   . Hyperlipidemia Brother   . Breast cancer Paternal Aunt        41s   Family Psychiatric  History: "a lot on my mother's side - depression" No suicides   Social History:  Social History   Substance and Sexual Activity  Alcohol Use Not Currently  . Alcohol/week: 0.0 standard drinks     Social History   Substance and Sexual Activity  Drug Use No    Social History   Socioeconomic History  . Marital status: Single    Spouse name: Not on file  . Number of children: 0  . Years of education: some college  . Highest education level: 12th grade  Occupational History    Employer: DISABLED  . Occupation: Disabled   Social Needs  . Financial resource strain: Very hard  . Food insecurity:    Worry: Never  true    Inability: Never true  . Transportation needs:    Medical: No    Non-medical: No  Tobacco Use  . Smoking status: Former Smoker    Packs/day: 1.50    Years: 20.00    Pack years: 30.00    Types: Cigarettes    Start date: 03/19/1979    Last attempt to quit: 08/25/1999    Years since quitting: 18.9  . Smokeless tobacco: Never Used  . Tobacco comment: smoking cessation materials not required  Substance and Sexual Activity  . Alcohol use: Not Currently    Alcohol/week: 0.0 standard drinks  . Drug use: No  . Sexual activity: Never  Lifestyle  . Physical activity:    Days per week: 0 days    Minutes per session: 0 min  . Stress: Very much  Relationships  . Social connections:    Talks on phone:  Patient refused    Gets together: Patient refused    Attends religious service: Patient refused    Active member of club or organization: Patient refused    Attends meetings of clubs or organizations: Patient refused    Relationship status: Patient refused  Other Topics Concern  . Not on file  Social History Narrative   Patient is single.    Patient lives with roommates.    Patient on disability    Patient has no children.    Patient has some college    Additional Social History:   Living with a room-mate Independent with her care. 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 disability  Substance use: Couple glasses alcohol Every 6-8 months No tobacco No caffeine No marijuana or other illicit drugs  Allergies:  No Known Allergies  Labs:  Results for orders placed or performed during the hospital encounter of 08/06/18 (from the past 48 hour(s))  Basic metabolic panel     Status: Abnormal   Collection Time: 08/10/18  5:39 AM  Result Value Ref Range   Sodium 134 (L) 135 - 145 mmol/L   Potassium 4.4 3.5 - 5.1 mmol/L   Chloride 103 98 - 111 mmol/L   CO2 22 22 - 32 mmol/L   Glucose, Bld 123 (H) 70 - 99 mg/dL    BUN 90 (H) 8 - 23 mg/dL   Creatinine, Ser 4.13 (H) 0.44 - 1.00 mg/dL   Calcium 7.9 (L) 8.9 - 10.3 mg/dL   GFR calc non Af Amer 11 (L) >60 mL/min   GFR calc Af Amer 12 (L) >60 mL/min   Anion gap 9 5 - 15    Comment: Performed at [facility], [address] [place], [place] [address]  Phosphorus     Status: Abnormal   Collection Time: 08/10/18  4:40 PM  Result Value Ref Range   Phosphorus 5.5 (H) 2.5 - 4.6 mg/dL    Comment: Performed at [facility], [address] [place], [place] [address]  Basic metabolic panel     Status: Abnormal   Collection Time: 08/11/18  4:07 AM  Result Value Ref Range   Sodium 137 135 - 145 mmol/L   Potassium 4.4 3.5 - 5.1 mmol/L   Chloride 102 98 - 111 mmol/L   CO2 23 22 - 32 mmol/L   Glucose, Bld 96 70 - 99 mg/dL   BUN 72 (H) 8 - 23 mg/dL   Creatinine, Ser 3.46 (H) 0.44 - 1.00 mg/dL   Calcium 8.1 (L) 8.9 - 10.3 mg/dL   GFR calc non Af Amer 13 (L) >60 mL/min   GFR calc Af Amer 15 (L) >60 mL/min   Anion gap 12 5 - 15    Comment: Performed at [facility], [address] [place], [place] [address]    Current Facility-Administered Medications  Medication Dose Route Frequency Provider Last Rate Last Dose  . 0.9 %  sodium chloride infusion  250 mL Intravenous PRN Seals, Levada Dy H, NP      . acetaminophen (TYLENOL) tablet 650 mg  650 mg Oral Q4H PRN Seals, Angela H, NP      . atorvastatin (LIPITOR) tablet 40 mg  40 mg Oral q1800 Larey Dresser, MD   40 mg at 08/10/18 1825  . Chlorhexidine Gluconate Cloth 2 % PADS 6 each  6 each Topical Q0600 Lateef, [place], MD   6 each at  08/11/18 0630  . cyclobenzaprine (FLEXERIL) tablet 10 mg  10 mg Oral TID PRN Bradly Bienenstock, NP      . DULoxetine (CYMBALTA) DR capsule 60 mg  60 mg Oral Daily Flora Lipps, MD   60 mg at 08/11/18 1342  . ferrous sulfate tablet 325 mg  325 mg Oral Daily Darel Hong D, NP   325 mg at 08/11/18 1342  . fluticasone (FLONASE) 50 MCG/ACT nasal spray 2  spray  2 spray Each Nare PRN Darel Hong D, NP      . hydrALAZINE (APRESOLINE) injection 10 mg  10 mg Intravenous Q4H PRN Fritzi Mandes, MD   10 mg at 08/10/18 2019  . hydrALAZINE (APRESOLINE) tablet 25 mg  25 mg Oral TID Larey Dresser, MD   25 mg at 08/11/18 1342  . ipratropium-albuterol (DUONEB) 0.5-2.5 (3) MG/3ML nebulizer solution 3 mL  3 mL Nebulization Q4H PRN Darel Hong D, NP      . isosorbide mononitrate (IMDUR) 24 hr tablet 30 mg  30 mg Oral Daily Fritzi Mandes, MD   30 mg at 08/11/18 1342  . labetalol (NORMODYNE) injection 20 mg  20 mg Intravenous Q6H PRN Darel Hong D, NP   20 mg at 08/09/18 0541  . levothyroxine (SYNTHROID) tablet 25 mcg  25 mcg Oral Q0600 Paticia Stack, [place]   25 mcg at 08/11/18 8341  . lidocaine (XYLOCAINE) 2 % viscous mouth solution 15 mL  15 mL Mouth/Throat PRN Darel Hong D, NP   15 mL at 08/07/18 1636  . metoprolol succinate (TOPROL-XL) 24 hr tablet 50 mg  50 mg Oral Daily Christell Faith M, PA-C   50 mg at 08/11/18 1342  . mometasone-formoterol (DULERA) 200-5 MCG/ACT inhaler 2 puff  2 puff Inhalation BID Darel Hong D, NP   2 puff at 08/10/18 1125  . ondansetron (ZOFRAN) injection 4 mg  4 mg Intravenous Q6H PRN Seals, Theo Dills, NP      . oxyCODONE-acetaminophen (PERCOCET/ROXICET) 5-325 MG per tablet 1 tablet  1 tablet Oral Q6H PRN Mansy, Jan A, MD   1 tablet at 08/10/18 2230   And  . oxyCODONE (Oxy IR/ROXICODONE) immediate release tablet 5 mg  5 mg Oral Q6H PRN Mansy, Jan A, MD   5 mg at 08/10/18 2230  . sodium chloride flush (NS) 0.9 % injection 10 mL  10 mL Intravenous Q12H Fritzi Mandes, MD   10 mL at 08/10/18 2232  . sodium chloride flush (NS) 0.9 % injection 3 mL  3 mL Intravenous Q12H Seals, Angela H, NP   3 mL at 08/11/18 1345  . sodium chloride flush (NS) 0.9 % injection 3 mL  3 mL Intravenous PRN Seals, Levada Dy H, NP      . temazepam (RESTORIL) capsule 15 mg  15 mg Oral QHS PRN Bradly Bienenstock, NP   15 mg at 08/07/18 2134  . tuberculin  injection 5 Units  5 Units Intradermal Once Anthonette Legato, MD   5 Units at 08/11/18 9622   Facility-Administered Medications Ordered in Other Encounters  Medication Dose Route Frequency Provider Last Rate Last Dose  . 0.9 %  sodium chloride infusion   Intravenous Once Lloyd Huger, MD      . 0.9 %  sodium chloride infusion   Intravenous Once Lloyd Huger, MD      . dexamethasone (DECADRON) 20 mg in sodium chloride 0.9 % 50 mL IVPB  20 mg Intravenous Once Lloyd Huger, MD      .  dexamethasone (DECADRON) injection 10 mg  10 mg Intravenous Once Lloyd Huger, MD      . sodium chloride flush (NS) 0.9 % injection 10 mL  10 mL Intravenous PRN Lloyd Huger, MD   10 mL at 06/16/17 0854    Musculoskeletal: Strength & Muscle Tone: within normal limits Gait & Station: not assessed Patient leans: N/A  Psychiatric Specialty Exam: Physical Exam  Nursing note and vitals reviewed. Constitutional: She is oriented to person, place, and time. She appears well-developed and well-nourished. No distress.  HENT:  Head: Normocephalic and atraumatic.  Eyes: EOM are normal.  Neck: Normal range of motion.  Cardiovascular: Normal rate and regular rhythm.  Respiratory: Effort normal. No respiratory distress.  Musculoskeletal: Normal range of motion.  Neurological: She is alert and oriented to person, place, and time.    Review of Systems  Constitutional: Positive for malaise/fatigue.  Respiratory: Negative.   Cardiovascular: Negative.   Psychiatric/Behavioral: Positive for depression. Negative for hallucinations, memory loss, substance abuse and suicidal ideas. The patient is nervous/anxious and has insomnia.     Blood pressure (!) 167/90, pulse 91, temperature 98.4 F (36.9 C), temperature source Oral, resp. rate 18, height 5\' 2"  (1.575 m), weight 75.9 kg, SpO2 95 %.Body mass index is 30.6 kg/m.  General Appearance: Casual  Eye Contact:  Good  Speech:  Clear and  Coherent and Normal Rate  Volume:  Normal  Mood:  Anxious and Dysphoric  Affect:  Congruent  Thought Process:  Coherent, Linear and Descriptions of Associations: Intact  Orientation:  Full (Time, Place, and Person)  Thought Content:  Logical and Hallucinations: None  Suicidal Thoughts:  No  Homicidal Thoughts:  No  Memory:  Immediate;   Good Recent;   Good Remote;   Good  Judgement:  Good  Insight:  Good  Psychomotor Activity:  Normal  Concentration:  Concentration: Good  Recall:  Good  Fund of Knowledge:  Good  Language:  Good  Akathisia:  No  Handed:  Right  AIMS (if indicated):     Assets:  Communication Skills Desire for Improvement Financial Resources/Insurance Housing Resilience Social Support  ADL's:  Intact  Cognition:  WNL  Sleep:   Falls asleep easily with sleep aids, however awakens early     Treatment Plan Summary: Daily contact with patient to assess and evaluate symptoms and progress in treatment and Medication management  Continue Cymbalta 60 mg daily as this appears to be effective in treating patient's depression. Will start Klonopin 0.5 mg daily at 4 PM, expecting this to be the home dose time. I have asked patient to take Restoril 15 mg at bedtime tonight only if needed. Will reevaluate patient on 08/11/2020 determine effectiveness of medication changes. Can make adjustments to sleep aid as indicated after evaluation of medication changes from today.  Disposition: No evidence of imminent risk to self or others at present.   Patient does not meet criteria for psychiatric inpatient admission. Supportive therapy provided about ongoing stressors.  Lavella Hammock, MD 08/11/2018 2:13 PM

## 2018-08-11 NOTE — Progress Notes (Signed)
Pre HD Tx   08/11/18 0940  Hand-Off documentation  Report given to (Full Name) Trellis Paganini RN  Report received from (Full Name) Laurelyn Sickle RN  Vital Signs  Temp 98.4 F (36.9 C)  Temp Source Oral  Pulse Rate 86  Pulse Rate Source Monitor  Resp (!) 24  BP (!) 160/82  BP Location Left Arm  BP Method Automatic  Patient Position (if appropriate) Lying  Oxygen Therapy  SpO2 98 %  O2 Device Nasal Cannula  O2 Flow Rate (L/min) 2 L/min  Pain Assessment  Pain Scale 0-10  Pain Score 0  Dialysis Weight  Weight 75.9 kg  Type of Weight Pre-Dialysis  Time-Out for Hemodialysis  What Procedure? Hemodialysis  Pt Identifiers(min of two) First/Last Name;MRN/Account#  Correct Site? Yes  Correct Side? Yes  Correct Procedure? Yes  Consents Verified? Yes  Rad Studies Available? N/A  Safety Precautions Reviewed? Yes  Engineer, civil (consulting) Number 3  Station Number 2  UF/Alarm Test Passed  Conductivity: Meter 14  Conductivity: Machine  13.8  pH 7.4  Reverse Osmosis Main  Normal Saline Lot Number [id number]  Dialyzer Lot Number [id number]  Disposable Set Lot Number [id number]  Machine Temperature 98.6 F (37 C)  Musician and Audible Yes  Blood Lines Intact and Secured Yes  Pre Treatment Patient Checks  Vascular access used during treatment Catheter  Hepatitis B Surface Antigen Results Negative  Date Hepatitis B Surface Antigen Drawn 08/10/18  Hepatitis B Surface Antibody  (<10)  Date Hepatitis B Surface Antibody Drawn 08/10/18  Hemodialysis Consent Verified Yes  Hemodialysis Standing Orders Initiated Yes  ECG (Telemetry) Monitor On Yes  Prime Ordered Normal Saline  Length of  DialysisTreatment -hour(s) 2.5 Hour(s)  Dialysis Treatment Comments Na 140  Dialyzer Elisio 17H NR  Dialysate Flow Ordered 500  Blood Flow Rate Ordered 250 mL/min  Ultrafiltration Goal 0 Liters  Dialysis Blood Pressure Support Ordered Normal Saline  Education / Care Plan  Dialysis Education  Provided Yes  Documented Education in Care Plan Yes  Hemodialysis Catheter Right Femoral vein Double-lumen  Placement Date/Time: 08/10/18 1514   Placed prior to admission: No  Time Out: Correct patient;Correct procedure;Correct site  Maximum sterile barrier precautions: Hand hygiene;Cap;Mask;Sterile gown;Sterile gloves;Large sterile sheet  Site Prep: Chlorh...  Site Condition No complications  Blue Lumen Status Capped (Central line)  Red Lumen Status Capped (Central line)  Dressing Type Biopatch;Occlusive  Dressing Status Dry;Intact;Old drainage

## 2018-08-11 NOTE — Progress Notes (Signed)
[facility]  ROUNDING NOTE   Subjective:   Seen and examined on hemodialysis treatment. Second treatment. Tolerating treatment well. UF even.     HEMODIALYSIS FLOWSHEET:  Blood Flow Rate (mL/min): 150 mL/min Arterial Pressure (mmHg): -70 mmHg Venous Pressure (mmHg): 70 mmHg Transmembrane Pressure (mmHg): 40 mmHg Ultrafiltration Rate (mL/min): 10 mL/min Dialysate Flow Rate (mL/min): 300 ml/min Conductivity: Machine : 14.1 Conductivity: Machine : 14.1 Dialysis Fluid Bolus: Normal Saline Bolus Amount (mL): 250 mL    Objective:  Vital signs in last 24 hours:  Temp:  [97.4 F (36.3 C)-98.9 F (37.2 C)] 98.3 F (36.8 C) (05/26 0744) Pulse Rate:  [83-94] 86 (05/26 0744) Resp:  [14-26] 20 (05/25 1806) BP: (130-231)/(84-135) 130/85 (05/26 0744) SpO2:  [92 %-100 %] 93 % (05/26 0744) Weight:  [76 kg-76.6 kg] 76 kg (05/26 0404)  Weight change:  Filed Weights   08/10/18 1745 08/10/18 1806 08/11/18 0404  Weight: 76.2 kg 76.4 kg 76 kg    Intake/Output: I/O last 3 completed shifts: In: 240 [P.O.:240] Out: 1270 [PTWSF:6812]   Intake/Output this shift:  No intake/output data recorded.  Physical Exam: General: NAD,   Head: Normocephalic, atraumatic. Moist oral mucosal membranes  Eyes: Anicteric, PERRL  Neck: Supple, trachea midline  Lungs:  Clear to auscultation, 2 L [place] O2  Heart: Regular rate and rhythm  Abdomen:  Soft, nontender,   Extremities:  trace peripheral edema.  Neurologic: Nonfocal, moving all four extremities  Skin: No lesions  Access: Right femoral temp cath 5/25 Dr. Trula Slade    Basic Metabolic Panel: Recent Labs  Lab 08/07/18 0340 08/08/18 0211 08/09/18 0531 08/10/18 0539 08/10/18 1640 08/11/18 0407  NA 139 137 137 134*  --  137  K 5.5* 5.1 5.0 4.4  --  4.4  CL 107 104 104 103  --  102  CO2 21* 22 23 22   --  23  GLUCOSE 119* 100* 106* 123*  --  96  BUN 73* 79* 89* 90*  --  72*  CREATININE 3.42* 3.50* 3.76* 4.13*  --  3.46*  CALCIUM  8.4* 8.0* 8.2* 7.9*  --  8.1*  PHOS  --   --   --   --  5.5*  --     Liver Function Tests: Recent Labs  Lab 08/05/18 0829 08/06/18 2014  AST 32 49*  ALT 21 22  ALKPHOS 61 66  BILITOT 0.9 1.2  PROT 5.7* 6.5  ALBUMIN 2.9* 3.3*   No results for input(s): LIPASE, AMYLASE in the last 168 hours. No results for input(s): AMMONIA in the last 168 hours.  CBC: Recent Labs  Lab 08/05/18 0829 08/06/18 2014 08/07/18 0340 08/08/18 0211  WBC 4.6 12.8* 13.0* 8.8  NEUTROABS 2.7 10.6*  --  5.6  HGB 8.1* 8.6* 8.7* 7.5*  HCT 24.1* 26.5* 27.2* 23.4*  MCV 103.0* 105.6* 107.5* 106.4*  PLT 45* 58* 49* 49*    Cardiac Enzymes: Recent Labs  Lab 08/06/18 2014 08/07/18 0340 08/07/18 0832 08/07/18 1507  TROPONINI 0.06* 1.10* 0.81* 0.35*    BNP: Invalid input(s): POCBNP  CBG: Recent Labs  Lab 08/07/18 0244  GLUCAP 111*    Microbiology: Results for orders placed or performed during the hospital encounter of 08/06/18  SARS Coronavirus 2 (CEPHEID - Performed in [facility] hospital lab), Hosp Order     Status: None   Collection Time: 08/06/18  8:15 PM  Result Value Ref Range Status   SARS Coronavirus 2 NEGATIVE NEGATIVE Final    Comment: (NOTE)  If result is NEGATIVE SARS-CoV-2 target nucleic acids are NOT DETECTED. The SARS-CoV-2 RNA is generally detectable in upper and lower  respiratory specimens during the acute phase of infection. The lowest  concentration of SARS-CoV-2 viral copies this assay can detect is 250  copies / mL. A negative result does not preclude SARS-CoV-2 infection  and should not be used as the sole basis for treatment or other  patient management decisions.  A negative result may occur with  improper specimen collection / handling, submission of specimen other  than nasopharyngeal swab, presence of viral mutation(s) within the  areas targeted by this assay, and inadequate number of viral copies  (<250 copies / mL). A negative result must be combined with  clinical  observations, patient history, and epidemiological information. If result is POSITIVE SARS-CoV-2 target nucleic acids are DETECTED. The SARS-CoV-2 RNA is generally detectable in upper and lower  respiratory specimens dur ing the acute phase of infection.  Positive  results are indicative of active infection with SARS-CoV-2.  Clinical  correlation with patient history and other diagnostic information is  necessary to determine patient infection status.  Positive results do  not rule out bacterial infection or co-infection with other viruses. If result is PRESUMPTIVE POSTIVE SARS-CoV-2 nucleic acids MAY BE PRESENT.   A presumptive positive result was obtained on the submitted specimen  and confirmed on repeat testing.  While 2019 novel coronavirus  (SARS-CoV-2) nucleic acids may be present in the submitted sample  additional confirmatory testing may be necessary for epidemiological  and / or clinical management purposes  to differentiate between  SARS-CoV-2 and other Sarbecovirus currently known to infect humans.  If clinically indicated additional testing with an alternate test  methodology [phone]) is advised. The SARS-CoV-2 RNA is generally  detectable in upper and lower respiratory sp ecimens during the acute  phase of infection. The expected result is Negative. Fact Sheet for Patients:  StrictlyIdeas.no Fact Sheet for Healthcare Providers: BankingDealers.co.za This test is not yet approved or cleared by the [place] FDA and has been authorized for detection and/or diagnosis of SARS-CoV-2 by FDA under an Emergency Use Authorization (EUA).  This EUA will remain in effect (meaning this test can be used) for the duration of the COVID-19 declaration under Section 564(b)(1) of the Act, 21 U.S.C. section 360bbb-3(b)(1), unless the authorization is terminated or revoked sooner. Performed at [facility], [address] [place], [place] [address]   MRSA PCR Screening     Status: None   Collection Time: 08/07/18  2:38 AM  Result Value Ref Range Status   MRSA by PCR NEGATIVE NEGATIVE Final    Comment:        The GeneXpert MRSA Assay (FDA approved for NASAL specimens only), is one component of a comprehensive MRSA colonization surveillance program. It is not intended to diagnose MRSA infection nor to guide or monitor treatment for MRSA infections. Performed at [facility], [address] [place], [place] [address]   CULTURE, BLOOD (ROUTINE X 2) w Reflex to ID Panel     Status: None (Preliminary result)   Collection Time: 08/07/18  3:40 AM  Result Value Ref Range Status   Specimen Description BLOOD LEFT ANTECUBITAL  Final   Special Requests   Final    BOTTLES DRAWN AEROBIC AND ANAEROBIC Blood Culture results may not be optimal due to an excessive volume of blood received in culture bottles   Culture   Final    NO GROWTH 4  DAYS Performed at [facility], [address] [place], [place] [address]    Report Status PENDING  Incomplete  CULTURE, BLOOD (ROUTINE X 2) w Reflex to ID Panel     Status: None (Preliminary result)   Collection Time: 08/07/18  3:40 AM  Result Value Ref Range Status   Specimen Description BLOOD BLOOD LEFT HAND  Final   Special Requests   Final    BOTTLES DRAWN AEROBIC AND ANAEROBIC Blood Culture results may not be optimal due to an excessive volume of blood received in culture bottles   Culture   Final    NO GROWTH 4 DAYS Performed at [facility], [address] [place], [place] [address]    Report Status PENDING  Incomplete  Urine Culture     Status: None   Collection Time: 08/08/18  2:10 AM  Result Value Ref Range Status   Specimen Description   Final    URINE, RANDOM Performed at [facility], [address] [place], [place] [address]    Special Requests   Final    Normal Performed at [facility], [address] [place], [place] [address]    Culture   Final    NO GROWTH Performed at [facility], [place], [place], [place] [address]    Report Status 08/09/2018 FINAL  Final    Coagulation Studies: No results for input(s): LABPROT, INR in the last 72 hours.  Urinalysis: No results for input(s): COLORURINE, LABSPEC, PHURINE, GLUCOSEU, HGBUR, BILIRUBINUR, KETONESUR, PROTEINUR, UROBILINOGEN, NITRITE, LEUKOCYTESUR in the last 72 hours.  Invalid input(s): APPERANCEUR    Imaging: Ct Chest Wo Contrast  Result Date: 08/09/2018 CLINICAL DATA:  Hemoptysis, ovarian cancer EXAM: CT CHEST WITHOUT CONTRAST TECHNIQUE: Multidetector CT imaging of the chest was performed following the standard protocol without IV contrast. COMPARISON:  PET-CT, 05/07/2018, CT chest, 02/02/2018 FINDINGS: Cardiovascular: Cardiomegaly. Trace pericardial effusion. Right chest port catheter. Mediastinum/Nodes: No enlarged mediastinal, hilar, or axillary lymph nodes. Thyroid gland, trachea, and esophagus demonstrate no significant findings. Lungs/Pleura: There is scattered bilateral ground-glass and clustered nodular pulmonary opacity, most conspicuous in the bilateral upper lobes and new compared to prior PET-CT dated 05/07/2018 (series 3, image 58). There is a new 3 mm subpleural pulmonary nodule of the left pulmonary apex (series 3, image 23) and a new 4 mm subpleural nodule of the right middle lobe (series 3, image 70). There is a thin-walled pneumatocele unchanged from prior examination. Small bilateral pleural effusions and associated atelectasis or consolidation. Upper Abdomen: No acute abnormality. Musculoskeletal: No chest wall mass or suspicious bone lesions identified. IMPRESSION: 1. There is scattered bilateral ground-glass and clustered nodular pulmonary opacity, most conspicuous in the bilateral upper lobes and new compared to prior PET-CT dated 05/07/2018 (series 3, image 58). Small bilateral pleural  effusions and associated atelectasis or consolidation. Findings are consistent with atypical infection or inflammation. Drug toxicity is a significant differential consideration in the setting of malignancy. 2. There is a new 3 mm subpleural pulmonary nodule of the left pulmonary apex (series 3, image 23) and a new 4 mm subpleural nodule of the right middle lobe (series 3, image 70). These are nonspecific and may be infectious or inflammatory although concerning for metastatic disease in the setting of primary ovarian malignancy. Attention on follow-up. Electronically Signed   By: Eddie Candle M.D.   On: 08/09/2018 12:25   Dg Abd Portable 1v  Result Date: 08/10/2018 CLINICAL DATA:  Dialysis catheter insertion. EXAM:  PORTABLE ABDOMEN - 1 VIEW COMPARISON:  Abdominal CT 08/06/2018. FINDINGS: Right femoral dialysis catheter extends to the L2-3 disc space level, corresponding with the infrarenal IVC on prior CT. The bowel gas pattern is normal. Patient is status post cholecystectomy and L4-5 fusion. IMPRESSION: Hemodialysis catheter tip at the L2-3 disc space level. Electronically Signed   By: Richardean Sale M.D.   On: 08/10/2018 15:35     Medications:   . sodium chloride     . atorvastatin  40 mg Oral q1800  . Chlorhexidine Gluconate Cloth  6 each Topical Q0600  . DULoxetine  60 mg Oral Daily  . ferrous sulfate  325 mg Oral Daily  . hydrALAZINE  25 mg Oral TID  . isosorbide mononitrate  30 mg Oral Daily  . levothyroxine  25 mcg Oral Q0600  . metoprolol succinate  50 mg Oral Daily  . mometasone-formoterol  2 puff Inhalation BID  . sodium chloride flush  10 mL Intravenous Q12H  . sodium chloride flush  3 mL Intravenous Q12H  . tuberculin  5 Units Intradermal Once   sodium chloride, acetaminophen, cyclobenzaprine, fluticasone, hydrALAZINE, ipratropium-albuterol, labetalol, lidocaine, ondansetron (ZOFRAN) IV, oxyCODONE-acetaminophen **AND** oxyCODONE, sodium chloride flush, temazepam  Assessment/  Plan:  Ms. Jessica Burgess is a 63 y.o. white female with history ofovarian cancer stage III,GERD, migraine headaches, cervical spinal stenosis,who was [facility] to [facility] on[facility]/21/2020for evaluation of significant shortness of breath.   1. Acute renal failure on chronic kidney disease stage III with proteinuria baseline creatinine 1.7, GFR of 30 on 07/27/18.   Chronic kidney disease cause seems unclear.  Acute renal failure is thought to be secondary to acute cardiorenal syndrome.  Serologic work up has been negative so far. ANA positive but reflex DS DNA negative, antiGBM negative,  Pending hepatitis, SPEP/UPEP, ANCA.  Requiring hemodialysis treatment. First treatment on 5/25.  Seen and examined on second treatment. Tolerating treatment well. UF negative.  Evaluate daily for dialysis need.   2. Hypertension: blood pressure at goal. Not currently on any blood pressure agents. Echo with systolic function slightly decreased, 45-50% EF Holding diuretics.   3. Anemia with acute renal failure: macrocytic.  - EPO with HD treatment today.    LOS: 5 Teea Ducey 5/26/20209:54 AM

## 2018-08-11 NOTE — Progress Notes (Signed)
Patient TB test was done at 0553 am on 5/56 on the Left Forearm. RN made aware.

## 2018-08-11 NOTE — Progress Notes (Signed)
Pre HD Assessment    08/11/18 0940  Neurological  Level of Consciousness Alert  Orientation Level Oriented X4  Respiratory  Respiratory Pattern Dyspnea with exertion  Chest Assessment Chest expansion symmetrical  Bilateral Breath Sounds Diminished  Cardiac  Pulse Regular  ECG Monitor Yes  Cardiac Rhythm NSR  Vascular  R Radial Pulse +2  L Radial Pulse +2  Integumentary  Integumentary (WDL) X  Skin Color  (HD pt - cath exit site (also cancer pt / port))  Musculoskeletal  Musculoskeletal (WDL) WDL  Gastrointestinal  Bowel Sounds Assessment Active  GU Assessment  Genitourinary (WDL) X  Genitourinary Symptoms  (new HD pt)  Psychosocial  Psychosocial (WDL) X  Patient Behaviors Cooperative;Anxious  Needs Expressed Emotional  Emotional support given Given to patient

## 2018-08-11 NOTE — TOC Progression Note (Signed)
Transition of Care [facility]) - Progression Note    Patient Details  Name: JULIYAH MERGEN MRN: [id number] Date of Birth: September 07, 1955  [facility] CM/SW Contact  Elza Rafter, RN Phone Number: 08/11/2018, 11:42 AM  Clinical Narrative:   Tiajuana Amass to discharge-2nd HD treatment today; evaluate daily for HD needs per nephrology.      Expected Discharge Plan: Home/Self Care Barriers to Discharge: Continued Medical Work up  Expected Discharge Plan and Services Expected Discharge Plan: Home/Self Care   Discharge Planning Services: CM Consult   Living arrangements for the past 2 months: Single Family Home                                       Social Determinants of Health (SDOH) Interventions    Readmission Risk Interventions Readmission Risk Prevention Plan 08/10/2018  Transportation Screening Complete  HRI or Home Care Consult Complete  Some recent data might be hidden

## 2018-08-11 NOTE — Progress Notes (Signed)
Patient refusing to stay in the bed despite bed alarm and education. Gets up to go to the bathroom and is currently sitting in the chair. Right fem cath looks clean dry and intact with no issues or signs of bleeding. Will continue to closely monitor site.

## 2018-08-11 NOTE — Progress Notes (Signed)
HD Tx Start   08/11/18 0950  Vital Signs  Pulse Rate 85  Resp 20  BP (!) 164/92  Oxygen Therapy  SpO2 100 %  O2 Device Nasal Cannula  O2 Flow Rate (L/min) 2 L/min  During Hemodialysis Assessment  Blood Flow Rate (mL/min) 250 mL/min  Arterial Pressure (mmHg) -70 mmHg  Venous Pressure (mmHg) 60 mmHg  Transmembrane Pressure (mmHg) 50 mmHg  Ultrafiltration Rate (mL/min) 200 mL/min (200 mL per HOUR)  Dialysate Flow Rate (mL/min) 500 ml/min  Conductivity: Machine  13.8  HD Safety Checks Performed Yes  Dialysis Fluid Bolus Normal Saline  Bolus Amount (mL) 250 mL  Intra-Hemodialysis Comments Tx initiated  Hemodialysis Catheter Right Femoral vein Double-lumen  Placement Date/Time: 08/10/18 1514   Placed prior to admission: No  Time Out: Correct patient;Correct procedure;Correct site  Maximum sterile barrier precautions: Hand hygiene;Cap;Mask;Sterile gown;Sterile gloves;Large sterile sheet  Site Prep: Chlorh...  Site Condition No complications  Blue Lumen Status Infusing  Red Lumen Status Infusing

## 2018-08-11 NOTE — Progress Notes (Signed)
Patient back from dialysis. VSS, no pain. Patient in the bathroom upon entry to room. Explained to patient that she needs to stay in bed because of the temporary femoral cath for dialysis. Explained why she needed to be on bedrest. Patient verbalized understanding. Bed alarm turned on.   Update 1443: patient up to the bathroom despite education on bedrest. Patient stated she "knew" she wasn't supposed to get up but did either way. Bed alarm was on.  Educated once again on why she needs to stay in bed. Explained it is for her safety and we do not want that catheter to come out and bleed out. Patient verbalized understanding and stated she would call us next time. Educated that we would have to use the bedpan.

## 2018-08-11 NOTE — Progress Notes (Addendum)
Patient stated that yesterday she felt very anxious the whole time during dialysis. Night shift reported the same thing. Patient requesting something for anxiety before she goes down for dialysis this morning. Dr. Posey Pronto notified, verbal orders with read-back to give 0.25mg  Xanax once. Report given to dialysis nurse. Patient will be going to dialysis shortly.   Update 930: patient down for dialysis.

## 2018-08-11 NOTE — Progress Notes (Signed)
HD Tx End  Pt did not remove fluid today -- tolerated well.     08/11/18 1215  Hand-Off documentation  Report given to (Full Name) Laurelyn Sickle RN  Report received from (Full Name) Trellis Paganini  Vital Signs  Temp 98.6 F (37 C)  Temp Source Oral  Pulse Rate 85  Pulse Rate Source Monitor  Resp 15  BP (!) 168/92  BP Location Left Arm  BP Method Automatic  Patient Position (if appropriate) Lying  Oxygen Therapy  SpO2 100 %  O2 Device Nasal Cannula  O2 Flow Rate (L/min) 2 L/min  Pain Assessment  Pain Scale 0-10  Pain Score 0  Dialysis Weight  Weight 75.9 kg  Type of Weight Post-Dialysis  During Hemodialysis Assessment  Blood Flow Rate (mL/min) 200 mL/min  Arterial Pressure (mmHg) -80 mmHg  Venous Pressure (mmHg) 60 mmHg  Transmembrane Pressure (mmHg) 50 mmHg  Ultrafiltration Rate (mL/min) 0 mL/min  Dialysate Flow Rate (mL/min) 500 ml/min  Conductivity: Machine  14  HD Safety Checks Performed Yes  Dialysis Fluid Bolus Normal Saline  Bolus Amount (mL) 250 mL  Intra-Hemodialysis Comments Tx completed;Tolerated well  Post-Hemodialysis Assessment  Rinseback Volume (mL) 250 mL  Dialyzer Clearance Lightly streaked  Duration of HD Treatment -hour(s) 2.5 hour(s)  Hemodialysis Intake (mL) 500 mL  UF Total -Machine (mL) 397 mL  Net UF (mL) -103 mL  Tolerated HD Treatment Yes  Hemodialysis Catheter Right Femoral vein Double-lumen  Placement Date/Time: 08/10/18 1514   Placed prior to admission: No  Time Out: Correct patient;Correct procedure;Correct site  Maximum sterile barrier precautions: Hand hygiene;Cap;Mask;Sterile gown;Sterile gloves;Large sterile sheet  Site Prep: Chlorh...  Site Condition No complications  Blue Lumen Status Flushed;Capped (Central line);Heparin locked  Red Lumen Status Flushed;Capped (Central line);Heparin locked  Catheter fill solution Heparin 1000 units/ml  Catheter fill volume (Arterial) 1.5 cc  Catheter fill volume (Venous) 1.5  Dressing  Type Biopatch;Occlusive  Dressing Status Dry;Intact;Old drainage  Drainage Description None  Dressing Change Due 08/17/18  Post treatment catheter status Capped and Clamped

## 2018-08-12 ENCOUNTER — Inpatient Hospital Stay: Payer: PPO

## 2018-08-12 ENCOUNTER — Ambulatory Visit: Payer: PPO

## 2018-08-12 ENCOUNTER — Other Ambulatory Visit: Payer: Self-pay

## 2018-08-12 ENCOUNTER — Encounter: Payer: Self-pay | Admitting: *Deleted

## 2018-08-12 DIAGNOSIS — I5021 Acute systolic (congestive) heart failure: Secondary | ICD-10-CM | POA: Diagnosis not present

## 2018-08-12 DIAGNOSIS — G4700 Insomnia, unspecified: Secondary | ICD-10-CM | POA: Diagnosis not present

## 2018-08-12 DIAGNOSIS — R0602 Shortness of breath: Secondary | ICD-10-CM | POA: Diagnosis not present

## 2018-08-12 DIAGNOSIS — F329 Major depressive disorder, single episode, unspecified: Secondary | ICD-10-CM | POA: Diagnosis not present

## 2018-08-12 DIAGNOSIS — N186 End stage renal disease: Secondary | ICD-10-CM | POA: Diagnosis not present

## 2018-08-12 DIAGNOSIS — F411 Generalized anxiety disorder: Secondary | ICD-10-CM | POA: Diagnosis not present

## 2018-08-12 DIAGNOSIS — Z992 Dependence on renal dialysis: Secondary | ICD-10-CM | POA: Diagnosis not present

## 2018-08-12 LAB — PROTEIN ELECTRO, RANDOM URINE
Albumin ELP, Urine: 58 %
Alpha-1-Globulin, U: 2 %
Alpha-2-Globulin, U: 11.1 %
Beta Globulin, U: 13.8 %
Gamma Globulin, U: 15.2 %
Total Protein, Urine: 58.3 mg/dL

## 2018-08-12 LAB — CULTURE, BLOOD (ROUTINE X 2)
Culture: NO GROWTH
Culture: NO GROWTH

## 2018-08-12 LAB — RENAL FUNCTION PANEL
Albumin: 3.1 g/dL — ABNORMAL LOW (ref 3.5–5.0)
Anion gap: 10 (ref 5–15)
BUN: 47 mg/dL — ABNORMAL HIGH (ref 8–23)
CO2: 27 mmol/L (ref 22–32)
Calcium: 8.3 mg/dL — ABNORMAL LOW (ref 8.9–10.3)
Chloride: 98 mmol/L (ref 98–111)
Creatinine, Ser: 3.36 mg/dL — ABNORMAL HIGH (ref 0.44–1.00)
GFR calc Af Amer: 16 mL/min — ABNORMAL LOW (ref >60)
GFR calc non Af Amer: 14 mL/min — ABNORMAL LOW (ref >60)
Glucose, Bld: 168 mg/dL — ABNORMAL HIGH (ref 70–99)
Phosphorus: 3.9 mg/dL (ref 2.5–4.6)
Potassium: 4 mmol/L (ref 3.5–5.1)
Sodium: 135 mmol/L (ref 135–145)

## 2018-08-12 LAB — HEPATITIS B CORE ANTIBODY, TOTAL: Hep B Core Total Ab: NEGATIVE

## 2018-08-12 LAB — PARATHYROID HORMONE, INTACT (NO CA): PTH: 148 pg/mL — ABNORMAL HIGH (ref 15–65)

## 2018-08-12 LAB — HEPATITIS B SURFACE ANTIGEN: Hepatitis B Surface Ag: NEGATIVE

## 2018-08-12 LAB — HEPATITIS B SURFACE ANTIBODY,QUALITATIVE: Hep B S Ab: REACTIVE

## 2018-08-12 MED ORDER — SUMATRIPTAN SUCCINATE 50 MG PO TABS
100.0000 mg | ORAL_TABLET | Freq: Three times a day (TID) | ORAL | Status: DC | PRN
  Administered 2018-08-12 – 2018-08-14 (×3): 100 mg via ORAL
  Filled 2018-08-12 (×5): qty 2

## 2018-08-12 MED ORDER — CLONAZEPAM 0.5 MG PO TABS
0.5000 mg | ORAL_TABLET | Freq: Two times a day (BID) | ORAL | Status: DC
  Administered 2018-08-12 – 2018-08-13 (×2): 0.5 mg via ORAL
  Filled 2018-08-12 (×2): qty 1

## 2018-08-12 MED ORDER — FUROSEMIDE 10 MG/ML IJ SOLN
40.0000 mg | Freq: Once | INTRAMUSCULAR | Status: AC
  Administered 2018-08-12: 40 mg via INTRAVENOUS
  Filled 2018-08-12: qty 4

## 2018-08-12 MED ORDER — CLONAZEPAM 1 MG PO TABS
1.0000 mg | ORAL_TABLET | Freq: Once | ORAL | Status: AC
  Administered 2018-08-12: 1 mg via ORAL
  Filled 2018-08-12: qty 1

## 2018-08-12 NOTE — Consult Note (Signed)
[facility] Face-to-Face Psychiatry Consult follow-up  Reason for Consult:  Anxiety Referring Physician:  Dr. Posey Pronto Patient Identification: Jessica Burgess MRN:  [id number] Principal Diagnosis: ESRD on dialysis [facility]) Diagnosis:  Principal Problem:   ESRD on dialysis [facility]) Active Problems:   Insomnia, persistent   Major depression, chronic ([place])   Generalized anxiety disorder   Acute CHF ([place])   Acute CHF (congestive heart failure) ([place])  Patient is seen, chart is reviewed, collateral and communication with nursing regarding patient having increased anxiety in the morning. Total Time spent with patient: 35 minutes  Subjective:  "I was feeling really anxious this morning."  HPI:  Jessica Burgess is a 63 y.o. female patient with a known history of ovarian cancer, CKD, hypothyroidism, hypertension and anemia.  Patient is currently undergoing chemotherapy followed by Dr. Mellody Drown and Dr. Grayland Ormond.  Patient reports having had chemotherapy infusion on yesterday however over the last 3 days she has received infusion of packed red blood cells as well as 2 L IV fluids according to the patient report.  She now presents to the emergency room with a 2-day history of increased shortness of breath and nonproductive cough.  Shortness of breath is made worse when lying flat.  She denies edema.  She denies chest pain.  She denies abdominal pain.  Patient denies fevers, chills, nausea, vomiting, or diarrhea.  Patient denies a prior known history of CHF.  However she is aware of renal failure occurring since March 2020.  CT abdomen was completed which demonstrated no evidence of hydronephrosis or urinary stone disease.  Chest x-ray demonstrated no significant interval change in cardiomegaly with mild vascular congestion and diffuse interstitial opacity.  No pleural effusion or pneumothorax identified.  However, patient developed increased shortness of breath while in the emergency room and was therefore placed on  BiPAP therapy to decrease work of breathing.  Psychiatry consult is requested for evaluation and assistance with management of anxiety.  08/11/2018: On evaluation, patient is calm and cooperative.  She is alert and oriented x4.  Patient describes her mood as stable, with ups and downs related to her cancer diagnosis in 2018.  She expresses that she has developed congestive heart failure and recently renal failure, "secondary to bad judgment.  I should have gotten the blood transfusion, but should not have gotten the extra fluids that worsened my heart failure."  Patient expresses usual level of anxiety while hospitalized.  She states that she has more anxiety at home.  Patient lives with a roommate, and she describes increasing anxiety about 1 hour before when room-mate comes home because her room-mate complains about work not getting done at the house.  Patient reports that she is "happy" in the morning and through the day.  She states she falls asleep easily with Restoril, but it  wears off at 3 AM.  Patient denies SI, HI, AVH.  She denies history of mania or psychosis.  08/12/2018: On evaluation, patient is laying in bed with migraine pain.  She reports that Klonopin that she received this morning was helpful in managing her anxiety, however she feels sedated and dizzy following the 1 mg dose.  She reports that she slept well last night.  Review of records note that she did not require additional Restoril.  Patient is agreeable to low-dose Klonopin twice daily to manage chronic anxiety related to her multiple medical comorbidities.  Patient expresses nausea today with migraine, however reports her appetite is unchanged.  She is denying SI, HI,  AVH.  Past Psychiatric History: Depression, anxiety, insomnia  Risk to Self:  no Risk to Others:  no Prior Inpatient Therapy:  none Prior Outpatient Therapy:  none  Past Medical History:  Past Medical History:  Diagnosis Date  . Allergic rhinitis, cause  unspecified   . Anxiety state, unspecified   . Arthritis   . Asthma    only when sick   . Backache, unspecified   . Bronchitis    hx of when get sick  . Cancer ([place])    skin cancer , basal cell   . Cancer ([place]) 11/2016   ovarian  . Cervicalgia   . Complication of anesthesia   . Dermatophytosis of nail   . Dysmetabolic syndrome X   . Encounter for long-term (current) use of other medications   . Esophageal reflux   . Insomnia, unspecified   . Leukocytosis, unspecified   . Migraine without aura, without mention of intractable migraine without mention of status migrainosus   . Other and unspecified hyperlipidemia   . Other malaise and fatigue   . Overweight(278.02)   . Personal history of chemotherapy now   ovarian  . PONV (postoperative nausea and vomiting)   . Spinal stenosis in cervical region   . Symptomatic menopausal or female climacteric states   . Unspecified disorder of skin and subcutaneous tissue   . Unspecified vitamin D deficiency     Past Surgical History:  Procedure Laterality Date  . ABDOMINAL HYSTERECTOMY    . ANTERIOR CERVICAL DECOMP/DISCECTOMY FUSION N/A 06/07/2015   Procedure: Cervical three - four and Cervical six- seven anterior cervical decompression with fusion interbody prosthesis plating and bonegraft;  Surgeon: Newman Pies, MD;  Location: [place] [facility];  Service: Neurosurgery;  Laterality: N/A;  C34 and C67 anterior cervical decompression with fusion interbody prosthesis plating and bonegraft  . BACK SURGERY     x2 Lower   . EVACUATION OF CERVICAL HEMATOMA N/A 06/14/2015   Procedure: EVACUATION OF CERVICAL HEMATOMA;  Surgeon: Newman Pies, MD;  Location: [place] [facility] ORS;  Service: Neurosurgery;  Laterality: N/A;  . NECK SURGERY     x3  . TONSILLECTOMY     Family History:  Family History  Problem Relation Age of Onset  . Depression Mother   . Migraines Mother   . Dementia Father   . Diabetes Father   . Hyperlipidemia Father   .  Hyperlipidemia Brother   . Hyperlipidemia Brother   . Breast cancer Paternal Aunt        74s   Family Psychiatric  History: "a lot on my mother's side - depression" No suicides   Social History:  Social History   Substance and Sexual Activity  Alcohol Use Not Currently  . Alcohol/week: 0.0 standard drinks     Social History   Substance and Sexual Activity  Drug Use No    Social History   Socioeconomic History  . Marital status: Single    Spouse name: Not on file  . Number of children: 0  . Years of education: some college  . Highest education level: 12th grade  Occupational History    Employer: DISABLED  . Occupation: Disabled   Social Needs  . Financial resource strain: Very hard  . Food insecurity:    Worry: Never true    Inability: Never true  . Transportation needs:    Medical: No    Non-medical: No  Tobacco Use  . Smoking status: Former Smoker    Packs/day: 1.50  Years: 20.00    Pack years: 30.00    Types: Cigarettes    Start date: 03/19/1979    Last attempt to quit: 08/25/1999    Years since quitting: 18.9  . Smokeless tobacco: Never Used  . Tobacco comment: smoking cessation materials not required  Substance and Sexual Activity  . Alcohol use: Not Currently    Alcohol/week: 0.0 standard drinks  . Drug use: No  . Sexual activity: Never  Lifestyle  . Physical activity:    Days per week: 0 days    Minutes per session: 0 min  . Stress: Very much  Relationships  . Social connections:    Talks on phone: Patient refused    Gets together: Patient refused    Attends religious service: Patient refused    Active member of club or organization: Patient refused    Attends meetings of clubs or organizations: Patient refused    Relationship status: Patient refused  Other Topics Concern  . Not on file  Social History Narrative   Patient is single.    Patient lives with roommates.    Patient on disability    Patient has no children.    Patient has some  college    Additional Social History:   Living with a room-mate Independent with her care. 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 disability  Substance use: Couple glasses alcohol Every 6-8 months No tobacco No caffeine No marijuana or other illicit drugs  Allergies:  No Known Allergies  Labs:  Results for orders placed or performed during the hospital encounter of 08/06/18 (from the past 48 hour(s))  Basic metabolic panel     Status: Abnormal   Collection Time: 08/11/18  4:07 AM  Result Value Ref Range   Sodium 137 135 - 145 mmol/L   Potassium 4.4 3.5 - 5.1 mmol/L   Chloride 102 98 - 111 mmol/L   CO2 23 22 - 32 mmol/L   Glucose, Bld 96 70 - 99 mg/dL   BUN 72 (H) 8 - 23 mg/dL   Creatinine, Ser 3.46 (H) 0.44 - 1.00 mg/dL   Calcium 8.1 (L) 8.9 - 10.3 mg/dL   GFR calc non Af Amer 13 (L) >60 mL/min   GFR calc Af Amer 15 (L) >60 mL/min   Anion gap 12 5 - 15    Comment: Performed at [facility], [address] [place], [place] [address]  Renal function panel     Status: Abnormal   Collection Time: 08/12/18  9:04 AM  Result Value Ref Range   Sodium 135 135 - 145 mmol/L   Potassium 4.0 3.5 - 5.1 mmol/L   Chloride 98 98 - 111 mmol/L   CO2 27 22 - 32 mmol/L   Glucose, Bld 168 (H) 70 - 99 mg/dL   BUN 47 (H) 8 - 23 mg/dL   Creatinine, Ser 3.36 (H) 0.44 - 1.00 mg/dL   Calcium 8.3 (L) 8.9 - 10.3 mg/dL   Phosphorus 3.9 2.5 - 4.6 mg/dL   Albumin 3.1 (L) 3.5 - 5.0 g/dL   GFR calc non Af Amer 14 (L) >60 mL/min   GFR calc Af Amer 16 (L) >60 mL/min   Anion gap 10 5 - 15    Comment: Performed at [facility], [address] [place], [place] [address]    Current Facility-Administered Medications  Medication Dose Route Frequency Provider Last Rate Last Dose  .  0.9 %  sodium chloride infusion  250 mL Intravenous PRN Seals, Levada Dy H, NP      . acetaminophen (TYLENOL) tablet 650 mg  650 mg Oral Q4H  PRN Seals, Angela H, NP      . atorvastatin (LIPITOR) tablet 40 mg  40 mg Oral q1800 Larey Dresser, MD   40 mg at 08/12/18 1742  . Chlorhexidine Gluconate Cloth 2 % PADS 6 each  6 each Topical Q0600 Anthonette Legato, MD   6 each at 08/12/18 0444  . clonazePAM (KLONOPIN) tablet 0.5 mg  0.5 mg Oral BID Lavella Hammock, MD   0.5 mg at 08/12/18 2014  . cyclobenzaprine (FLEXERIL) tablet 10 mg  10 mg Oral TID PRN Bradly Bienenstock, NP      . DULoxetine (CYMBALTA) DR capsule 60 mg  60 mg Oral Daily Flora Lipps, MD   60 mg at 08/12/18 0910  . ferrous sulfate tablet 325 mg  325 mg Oral Daily Darel Hong D, NP   325 mg at 08/12/18 0910  . fluticasone (FLONASE) 50 MCG/ACT nasal spray 2 spray  2 spray Each Nare PRN Bradly Bienenstock, NP   2 spray at 08/12/18 [phone]  . hydrALAZINE (APRESOLINE) injection 10 mg  10 mg Intravenous Q4H PRN Fritzi Mandes, MD   10 mg at 08/12/18 0550  . hydrALAZINE (APRESOLINE) tablet 25 mg  25 mg Oral TID Larey Dresser, MD   25 mg at 08/12/18 2014  . ipratropium-albuterol (DUONEB) 0.5-2.5 (3) MG/3ML nebulizer solution 3 mL  3 mL Nebulization Q4H PRN Darel Hong D, NP   3 mL at 08/12/18 0433  . isosorbide mononitrate (IMDUR) 24 hr tablet 30 mg  30 mg Oral Daily Fritzi Mandes, MD   30 mg at 08/12/18 0910  . labetalol (NORMODYNE) injection 20 mg  20 mg Intravenous Q6H PRN Darel Hong D, NP   20 mg at 08/12/18 0440  . levothyroxine (SYNTHROID) tablet 25 mcg  25 mcg Oral Q0600 Paticia Stack,    25 mcg at 08/12/18 0444  . lidocaine (XYLOCAINE) 2 % viscous mouth solution 15 mL  15 mL Mouth/Throat PRN Darel Hong D, NP   15 mL at 08/07/18 1636  . metoprolol succinate (TOPROL-XL) 24 hr tablet 50 mg  50 mg Oral Daily Christell Faith M, PA-C   50 mg at 08/12/18 0910  . mometasone-formoterol (DULERA) 200-5 MCG/ACT inhaler 2 puff  2 puff Inhalation BID Darel Hong D, NP   2 puff at 08/12/18 2014  . ondansetron (ZOFRAN) injection 4 mg  4 mg Intravenous Q6H PRN Seals, Levada Dy  H, NP      . oxyCODONE (Oxy IR/ROXICODONE) immediate release tablet 5 mg  5 mg Oral Q6H PRN Mansy, Jan A, MD   5 mg at 08/12/18 1853  . sodium chloride flush (NS) 0.9 % injection 10 mL  10 mL Intravenous Q12H Fritzi Mandes, MD   10 mL at 08/12/18 0911  . sodium chloride flush (NS) 0.9 % injection 3 mL  3 mL Intravenous Q12H Seals, Angela H, NP   3 mL at 08/11/18 1345  . sodium chloride flush (NS) 0.9 % injection 3 mL  3 mL Intravenous PRN Seals, Levada Dy H, NP      . SUMAtriptan (IMITREX) tablet 100 mg  100 mg Oral TID PRN Fritzi Mandes, MD   100 mg at 08/12/18 1742  . tuberculin injection 5 Units  5 Units Intradermal Once Anthonette Legato, MD   5 Units at 08/11/18 [phone]  Facility-Administered Medications Ordered in Other Encounters  Medication Dose Route Frequency Provider Last Rate Last Dose  . 0.9 %  sodium chloride infusion   Intravenous Once Lloyd Huger, MD      . 0.9 %  sodium chloride infusion   Intravenous Once Lloyd Huger, MD      . dexamethasone (DECADRON) 20 mg in sodium chloride 0.9 % 50 mL IVPB  20 mg Intravenous Once Lloyd Huger, MD      . dexamethasone (DECADRON) injection 10 mg  10 mg Intravenous Once Lloyd Huger, MD      . sodium chloride flush (NS) 0.9 % injection 10 mL  10 mL Intravenous PRN Lloyd Huger, MD   10 mL at 06/16/17 8938    Musculoskeletal: Strength & Muscle Tone: within normal limits Gait & Station: not assessed Patient leans: N/A  Psychiatric Specialty Exam: Physical Exam  Nursing note and vitals reviewed. Constitutional: She is oriented to person, place, and time. She appears well-developed and well-nourished. No distress.  HENT:  Head: Normocephalic and atraumatic.  Eyes: EOM are normal.  Neck: Normal range of motion.  Cardiovascular: Normal rate and regular rhythm.  Respiratory: Effort normal. No respiratory distress.  Musculoskeletal: Normal range of motion.  Neurological: She is alert and oriented to person, place,  and time.    Review of Systems  Constitutional: Positive for malaise/fatigue.  Respiratory: Negative.   Cardiovascular: Negative.   Musculoskeletal: Negative.   Neurological: Positive for dizziness, weakness and headaches.  Psychiatric/Behavioral: Positive for depression. Negative for hallucinations, memory loss, substance abuse and suicidal ideas. The patient is nervous/anxious and has insomnia.     Blood pressure (!) 179/89, pulse 82, temperature 98.3 F (36.8 C), temperature source Oral, resp. rate 18, height 5\' 2"  (1.575 m), weight 75.3 kg, SpO2 94 %.Body mass index is 30.34 kg/m.  General Appearance: Casual and holding her head pain  Eye Contact:  Minimal  Speech:  Clear and Coherent and Normal Rate  Volume:  Normal  Mood:  Anxious and Dysphoric, uncomfortable  Affect:  Congruent  Thought Process:  Coherent, Linear and Descriptions of Associations: Intact  Orientation:  Full (Time, Place, and Person)  Thought Content:  Logical and Hallucinations: None  Suicidal Thoughts:  No  Homicidal Thoughts:  No  Memory:  Immediate;   Good Recent;   Good Remote;   Good  Judgement:  Good  Insight:  Good  Psychomotor Activity:  Normal  Concentration:  Concentration: Good  Recall:  Good  Fund of Knowledge:  Good  Language:  Good  Akathisia:  No  Handed:  Right  AIMS (if indicated):     Assets:  Communication Skills Desire for Improvement Financial Resources/Insurance Housing Resilience Social Support  ADL's:  Intact  Cognition:  WNL  Sleep:   Slept well overnight     Treatment Plan Summary: Daily contact with patient to assess and evaluate symptoms and progress in treatment and Medication management  Continue Cymbalta 60 mg daily as this appears to be effective in treating patient's depression. Change Klonopin 0.5 mg twice daily.   Discontinue Restoril 15 mg Will reevaluate patient on 08/12/2020 determine effectiveness of medication changes.  Disposition: No evidence of  imminent risk to self or others at present.   Patient does not meet criteria for psychiatric inpatient admission. Supportive therapy provided about ongoing stressors.  Lavella Hammock, MD 08/12/2018 9:03 PM

## 2018-08-12 NOTE — Plan of Care (Signed)
  Problem: Health Behavior/Discharge Planning: Goal: Ability to manage health-related needs will improve Outcome: Progressing   Problem: Activity: Goal: Risk for activity intolerance will decrease Outcome: Progressing   Problem: Nutrition: Goal: Adequate nutrition will be maintained Outcome: Progressing   Problem: Coping: Goal: Level of anxiety will decrease Outcome: Not Progressing Note:  Pt continues to be very anxious- although this evening it has improved since PRN medication for anxiety added.

## 2018-08-12 NOTE — Progress Notes (Addendum)
Patient refusing bed alarm. Laying in the bed. Attempted to educate on bed alarm for safety and bed rest as well but patient stating she does not want the alarm on. Patient short of breath at rest and tachypnea. Also complaining of a headache but wants to wait on MD before getting medication. She would like a pack of ice for it. Will bring in pack of ice and make MD aware of situation.   Update: Psych MD paged to notify and ask for anti-anxiety med. Patient not as tachypnic anymore. MD verbal order with read-back to administer 1mg  Klonopin now.

## 2018-08-12 NOTE — Progress Notes (Addendum)
Patient complaining of migraine. Ice pack did not help. Dr. Posey Pronto notified to get order for medication. Verbal order from Dr. Posey Pronto to give Imitrex 100 mg TID PRN. Will administer and continue to monitor.   Update 1815: Ice pack given to patient. Breathing has improved.

## 2018-08-12 NOTE — Progress Notes (Signed)
This RN updated patients sister, [name]. All questions answered. Will continue to monitor.

## 2018-08-12 NOTE — Progress Notes (Signed)
[place] [facility] [place] NAME: Jessica Burgess    MR#:  [id number]  DATE OF BIRTH:  [birth year]  SUBJECTIVE:   Doing ok. Dealing with lot of anxiety due to her recent health issues. HD on hold today REVIEW OF SYSTEMS:   Review of Systems  Constitutional: Negative for chills, fever and weight loss.  HENT: Negative for ear discharge, ear pain and nosebleeds.   Eyes: Negative for blurred vision, pain and discharge.  Respiratory: Positive for shortness of breath. Negative for sputum production, wheezing and stridor.   Cardiovascular: Negative for chest pain, palpitations, orthopnea and PND.  Gastrointestinal: Negative for abdominal pain, diarrhea, nausea and vomiting.  Genitourinary: Negative for frequency and urgency.  Musculoskeletal: Negative for back pain and joint pain.  Neurological: Positive for weakness. Negative for sensory change, speech change and focal weakness.  Psychiatric/Behavioral: Negative for depression and hallucinations. The patient is not nervous/anxious.    Tolerating Diet:yes Tolerating PT: ambulatory  DRUG ALLERGIES:  No Known Allergies  VITALS:  Blood pressure 136/85, pulse 89, temperature 99.1 F (37.3 C), temperature source Oral, resp. rate 20, height 5\' 2"  (1.575 m), weight 75.3 kg, SpO2 90 %.  PHYSICAL EXAMINATION:   Physical Exam  GENERAL:  63 y.o.-year-old patient lying in the bed with no acute distress.  EYES: Pupils equal, round, reactive to light and accommodation. No scleral icterus. Extraocular muscles intact.  HEENT: Head atraumatic, normocephalic. Oropharynx and nasopharynx clear. Puffy face NECK:  Supple, no jugular venous distention. No thyroid enlargement, no tenderness.  LUNGS: decreased breath sounds bilaterally, no wheezing, few rales,no rhonchi. No use of accessory muscles of respiration.  CARDIOVASCULAR: S1, S2 normal. No murmurs, rubs, or gallops.  ABDOMEN: Soft, nontender, nondistended.  Bowel sounds present. No organomegaly or mass.  EXTREMITIES: No cyanosis, clubbing or edema b/l.    NEUROLOGIC: Cranial nerves II through XII are intact. No focal Motor or sensory deficits b/l.   PSYCHIATRIC:  patient is alert and oriented x 3.  SKIN: No obvious rash, lesion, or ulcer.   LABORATORY PANEL:  CBC Recent Labs  Lab 08/08/18 0211  WBC 8.8  HGB 7.5*  HCT 23.4*  PLT 49*    Chemistries  Recent Labs  Lab 08/06/18 2014  08/12/18 0904  NA 138   < > 135  K 5.5*   < > 4.0  CL 107   < > 98  CO2 20*   < > 27  GLUCOSE 148*   < > 168*  BUN 69*   < > 47*  CREATININE 3.19*   < > 3.36*  CALCIUM 8.3*   < > 8.3*  AST 49*  --   --   ALT 22  --   --   ALKPHOS 66  --   --   BILITOT 1.2  --   --    < > = values in this interval not displayed.   Cardiac Enzymes Recent Labs  Lab 08/07/18 1507  TROPONINI 0.35*   RADIOLOGY:  Dg Chest 2 View  Result Date: 08/12/2018 CLINICAL DATA:  Shortness of breath EXAM: CHEST - 2 VIEW COMPARISON:  08/09/2018 CT, chest x-ray 08/07/2018 FINDINGS: Right Port-A-Cath remains in place, unchanged. Cardiomegaly with vascular congestion. Patchy opacities in the left mid and lower lung could reflect asymmetric edema or infection. Small bilateral effusions. No acute bony abnormality. IMPRESSION: Cardiomegaly with vascular congestion. Left mid and lower lung airspace opacity could reflect asymmetric edema or infection. Small effusions. Electronically Signed  By: Rolm Baptise M.D.   On: 08/12/2018 12:03   Dg Abd Portable 1v  Result Date: 08/10/2018 CLINICAL DATA:  Dialysis catheter insertion. EXAM: PORTABLE ABDOMEN - 1 VIEW COMPARISON:  Abdominal CT 08/06/2018. FINDINGS: Right femoral dialysis catheter extends to the L2-3 disc space level, corresponding with the infrarenal IVC on prior CT. The bowel gas pattern is normal. Patient is status post cholecystectomy and L4-5 fusion. IMPRESSION: Hemodialysis catheter tip at the L2-3 disc space level.  Electronically Signed   By: Richardean Sale M.D.   On: 08/10/2018 15:35   ASSESSMENT AND PLAN:   Jessica Burgess  is a 63 y.o. female with a known history of ovarian cancer, CKD, hypothyroidism, hypertension and anemia.  Patient is currently undergoing chemotherapy followed by Dr. Mellody Drown and Dr. Grayland Ormond. Presented to the emergency room with a 2-day history of increased shortness of breath and nonproductive cough.  Shortness of breath is made worse when lying flat.    1.  Acute diastolic CHF-BNP 1660 - Patient started on Lasix 40 mg IV twice daily-- on daily lasix IV 40 mg-- Lasix held given elevated creatinine--HD started on 08/10/2018 - echocardiogram  left ventricle has mildly reduced systolic function, with an ejection fraction of 45-50%. The cavity size was normal. There is mildly increased left ventricular wall thickness. Concern for anterior/anteroseptal wall hypokinesis -sats 99-100% 2 liter - appreciate cardiology input from Drs gollan/Mclean/Arida -added hydralazine and imdur per cardiology recommendation for after load reduction -HD started for cardiorenal syndrome on 08/10/2018  2.  Acute on chronic renal failure-III -?cardiorenal syndrome -creat baseline 1.7 -creat 3.20--3.19--3.4--3.76--4.19--HD started (08/11/2018) --3.4 -pending renal labs ANCA,GBM,ENA,DNA/DS, AntiCH,centro, Jo  3.  Hyperkalemia - Potassium level is 5.5.  4.  High-grade ovarian cancer - Patient is status post chemotherapy with gemcitabine - seen by  Dr. Grayland Ormond   5.  Lactic acidosis- 3.4 lactic acid level--3.1--2.0 - Will repeat lactic acid level as patient is diuresed.  No other evidence of infectious process.  Blood, urine, and sputum cultures are pending.  Will treat as indicated  6.  Hypothyroidism - Synthroid continued  7. Anemia of chronic dz -recently received 2 units BT at the cancer center  8. TCP suspected due to chemo stable at 49K  9. Anxiety spells -patient has history of  previous anxiety and depression in the past. Psych consult placed with Dr. Reeves Dam in put cont cymbalta, klonopin   DVT and PPI prophylaxis have been initiated.    CODE STATUS: full  DVT Prophylaxis: heparin  TOTAL TIME TAKING CARE OF THIS PATIENT: *30* minutes.  >50% time spent on counselling and coordination of care  POSSIBLE D/C IN *?* DAYS, DEPENDING ON CLINICAL CONDITION.  Note: This dictation was prepared with Dragon dictation along with smaller phrase technology. Any transcriptional errors that result from this process are unintentional.  Fritzi Mandes M.D on 08/12/2018 at 1:16 PM  Between 7am to 6pm - Pager - [phone]  After 6pm go to [email or web address] - password [email or web address] [facility]  Office  [phone]  CC: Primary care physician; Steele Sizer, MDPatient ID: Jessica Burgess, female   DOB: 08/27/1955, 63 y.o.   MRN: [id number]

## 2018-08-12 NOTE — Progress Notes (Signed)
[facility]  ROUNDING NOTE   Subjective:   Two consecutive days of hemodialysis. Patient's shortness of breath has improved but continues to have significant anxiety.   UOP 700 - however unclear how much is being recorded.   Objective:  Vital signs in last 24 hours:  Temp:  [97.6 F (36.4 C)-99.1 F (37.3 C)] 97.7 F (36.5 C) (05/27 1516) Pulse Rate:  [81-91] 81 (05/27 1516) Resp:  [18-20] 20 (05/27 0443) BP: (129-194)/(67-104) 157/91 (05/27 1516) SpO2:  [90 %-100 %] 94 % (05/27 1516) Weight:  [75.3 kg] 75.3 kg (05/27 0500)  Weight change: -0.7 kg Filed Weights   08/11/18 0940 08/11/18 1215 08/12/18 0500  Weight: 75.9 kg 75.9 kg 75.3 kg    Intake/Output: I/O last 3 completed shifts: In: 256 [P.O.:240; I.V.:16] Out: 1097 [Urine:1200]   Intake/Output this shift:  Total I/O In: -  Out: 500 [Urine:500]  Physical Exam: General: NAD,   Head: Normocephalic, atraumatic. Moist oral mucosal membranes  Eyes: Anicteric, PERRL  Neck: Supple, trachea midline  Lungs:  Clear to auscultation, 2 L [place] O2  Heart: Regular rate and rhythm  Abdomen:  Soft, nontender,   Extremities:  trace peripheral edema.  Neurologic: Nonfocal, moving all four extremities  Skin: No lesions  Access: Right femoral temp cath 5/25 Dr. Trula Slade    Basic Metabolic Panel: Recent Labs  Lab 08/08/18 0211 08/09/18 0531 08/10/18 0539 08/10/18 1640 08/11/18 0407 08/12/18 0904  NA 137 137 134*  --  137 135  K 5.1 5.0 4.4  --  4.4 4.0  CL 104 104 103  --  102 98  CO2 22 23 22   --  23 27  GLUCOSE 100* 106* 123*  --  96 168*  BUN 79* 89* 90*  --  72* 47*  CREATININE 3.50* 3.76* 4.13*  --  3.46* 3.36*  CALCIUM 8.0* 8.2* 7.9*  --  8.1* 8.3*  PHOS  --   --   --  5.5*  --  3.9    Liver Function Tests: Recent Labs  Lab 08/06/18 2014 08/12/18 0904  AST 49*  --   ALT 22  --   ALKPHOS 66  --   BILITOT 1.2  --   PROT 6.5  --   ALBUMIN 3.3* 3.1*   No results for input(s): LIPASE,  AMYLASE in the last 168 hours. No results for input(s): AMMONIA in the last 168 hours.  CBC: Recent Labs  Lab 08/06/18 2014 08/07/18 0340 08/08/18 0211  WBC 12.8* 13.0* 8.8  NEUTROABS 10.6*  --  5.6  HGB 8.6* 8.7* 7.5*  HCT 26.5* 27.2* 23.4*  MCV 105.6* 107.5* 106.4*  PLT 58* 49* 49*    Cardiac Enzymes: Recent Labs  Lab 08/06/18 2014 08/07/18 0340 08/07/18 0832 08/07/18 1507  TROPONINI 0.06* 1.10* 0.81* 0.35*    BNP: Invalid input(s): POCBNP  CBG: Recent Labs  Lab 08/07/18 0244  GLUCAP 111*    Microbiology: Results for orders placed or performed during the hospital encounter of 08/06/18  SARS Coronavirus 2 (CEPHEID - Performed in [facility] hospital lab), Hosp Order     Status: None   Collection Time: 08/06/18  8:15 PM  Result Value Ref Range Status   SARS Coronavirus 2 NEGATIVE NEGATIVE Final    Comment: (NOTE) If result is NEGATIVE SARS-CoV-2 target nucleic acids are NOT DETECTED. The SARS-CoV-2 RNA is generally detectable in upper and lower  respiratory specimens during the acute phase of infection. The lowest  concentration of SARS-CoV-2 viral  copies this assay can detect is 250  copies / mL. A negative result does not preclude SARS-CoV-2 infection  and should not be used as the sole basis for treatment or other  patient management decisions.  A negative result may occur with  improper specimen collection / handling, submission of specimen other  than nasopharyngeal swab, presence of viral mutation(s) within the  areas targeted by this assay, and inadequate number of viral copies  (<250 copies / mL). A negative result must be combined with clinical  observations, patient history, and epidemiological information. If result is POSITIVE SARS-CoV-2 target nucleic acids are DETECTED. The SARS-CoV-2 RNA is generally detectable in upper and lower  respiratory specimens dur ing the acute phase of infection.  Positive  results are indicative of active  infection with SARS-CoV-2.  Clinical  correlation with patient history and other diagnostic information is  necessary to determine patient infection status.  Positive results do  not rule out bacterial infection or co-infection with other viruses. If result is PRESUMPTIVE POSTIVE SARS-CoV-2 nucleic acids MAY BE PRESENT.   A presumptive positive result was obtained on the submitted specimen  and confirmed on repeat testing.  While 2019 novel coronavirus  (SARS-CoV-2) nucleic acids may be present in the submitted sample  additional confirmatory testing may be necessary for epidemiological  and / or clinical management purposes  to differentiate between  SARS-CoV-2 and other Sarbecovirus currently known to infect humans.  If clinically indicated additional testing with an alternate test  methodology [phone]) is advised. The SARS-CoV-2 RNA is generally  detectable in upper and lower respiratory sp ecimens during the acute  phase of infection. The expected result is Negative. Fact Sheet for Patients:  StrictlyIdeas.no Fact Sheet for Healthcare Providers: BankingDealers.co.za This test is not yet approved or cleared by the [place] FDA and has been authorized for detection and/or diagnosis of SARS-CoV-2 by FDA under an Emergency Use Authorization (EUA).  This EUA will remain in effect (meaning this test can be used) for the duration of the COVID-19 declaration under Section 564(b)(1) of the Act, 21 U.S.C. section 360bbb-3(b)(1), unless the authorization is terminated or revoked sooner. Performed at [facility], [address] [place], [place] [address]   MRSA PCR Screening     Status: None   Collection Time: 08/07/18  2:38 AM  Result Value Ref Range Status   MRSA by PCR NEGATIVE NEGATIVE Final    Comment:        The GeneXpert MRSA Assay (FDA approved for NASAL specimens only), is one component of a comprehensive MRSA  colonization surveillance program. It is not intended to diagnose MRSA infection nor to guide or monitor treatment for MRSA infections. Performed at [facility], [address] [place], [place] [address]   CULTURE, BLOOD (ROUTINE X 2) w Reflex to ID Panel     Status: None   Collection Time: 08/07/18  3:40 AM  Result Value Ref Range Status   Specimen Description BLOOD LEFT ANTECUBITAL  Final   Special Requests   Final    BOTTLES DRAWN AEROBIC AND ANAEROBIC Blood Culture results may not be optimal due to an excessive volume of blood received in culture bottles   Culture   Final    NO GROWTH 5 DAYS Performed at [facility], [address] [place], [place] [address]    Report Status 08/12/2018 FINAL  Final  CULTURE, BLOOD (ROUTINE X 2) w Reflex to ID Panel     Status: None  Collection Time: 08/07/18  3:40 AM  Result Value Ref Range Status   Specimen Description BLOOD BLOOD LEFT HAND  Final   Special Requests   Final    BOTTLES DRAWN AEROBIC AND ANAEROBIC Blood Culture results may not be optimal due to an excessive volume of blood received in culture bottles   Culture   Final    NO GROWTH 5 DAYS Performed at [facility], [address] [place], [place] [address]    Report Status 08/12/2018 FINAL  Final  Urine Culture     Status: None   Collection Time: 08/08/18  2:10 AM  Result Value Ref Range Status   Specimen Description   Final    URINE, RANDOM Performed at [facility], [address], [place], [place] [address]    Special Requests   Final    Normal Performed at [facility], [address], [place], [place] [address]    Culture   Final    NO GROWTH Performed at [facility], [place] [address], [place], [place] [address]    Report Status 08/09/2018 FINAL  Final    Coagulation Studies: No results for input(s): LABPROT, INR in the last 72 hours.  Urinalysis: No results for input(s): COLORURINE, LABSPEC,  PHURINE, GLUCOSEU, HGBUR, BILIRUBINUR, KETONESUR, PROTEINUR, UROBILINOGEN, NITRITE, LEUKOCYTESUR in the last 72 hours.  Invalid input(s): APPERANCEUR    Imaging: Dg Chest 2 View  Result Date: 08/12/2018 CLINICAL DATA:  Shortness of breath EXAM: CHEST - 2 VIEW COMPARISON:  08/09/2018 CT, chest x-ray 08/07/2018 FINDINGS: Right Port-A-Cath remains in place, unchanged. Cardiomegaly with vascular congestion. Patchy opacities in the left mid and lower lung could reflect asymmetric edema or infection. Small bilateral effusions. No acute bony abnormality. IMPRESSION: Cardiomegaly with vascular congestion. Left mid and lower lung airspace opacity could reflect asymmetric edema or infection. Small effusions. Electronically Signed   By: Rolm Baptise M.D.   On: 08/12/2018 12:03     Medications:   . sodium chloride     . atorvastatin  40 mg Oral q1800  . Chlorhexidine Gluconate Cloth  6 each Topical Q0600  . clonazePAM  0.5 mg Oral BID  . DULoxetine  60 mg Oral Daily  . ferrous sulfate  325 mg Oral Daily  . hydrALAZINE  25 mg Oral TID  . isosorbide mononitrate  30 mg Oral Daily  . levothyroxine  25 mcg Oral Q0600  . metoprolol succinate  50 mg Oral Daily  . mometasone-formoterol  2 puff Inhalation BID  . sodium chloride flush  10 mL Intravenous Q12H  . sodium chloride flush  3 mL Intravenous Q12H  . tuberculin  5 Units Intradermal Once   sodium chloride, acetaminophen, cyclobenzaprine, fluticasone, hydrALAZINE, ipratropium-albuterol, labetalol, lidocaine, ondansetron (ZOFRAN) IV, [DISCONTINUED] oxyCODONE-acetaminophen **AND** oxyCODONE, sodium chloride flush  Assessment/ Plan:  Ms. CAMELLA SEIM is a 63 y.o. white female with history ofovarian cancer stage III,GERD, migraine headaches, cervical spinal stenosis,who was admitted to [facility] on5/21/2020for evaluation of significant shortness of breath.   1. Acute renal failure on chronic kidney disease stage III with proteinuria baseline  creatinine 1.7, GFR of 30 on 07/27/18.   Chronic kidney disease cause seems unclear.  Acute renal failure is thought to be secondary to acute cardiorenal syndrome.  Serologic work up has been negative so far  First treatment on 5/25.  Evaluate daily for dialysis need. No indication for dialysis today.   2. Hypertension: blood pressure at goal. Not currently on any blood pressure  agents. Echo with systolic function slightly decreased, 45-50% EF Holding diuretics.  Metoprolol, hydralazine and isosorbide mononitrate  3. Anemia with acute renal failure: macrocytic. Needs a new CBC.  - EPO with HD treatment.    LOS: [place] 5/27/20203:49 PM

## 2018-08-12 NOTE — Plan of Care (Signed)
Pt treated twice with oxycodone for chronic pt which did help bring pain down, pt also had to get a breathing treatment for complaints of shortness of breath, sats were still good in the high 90's, but pt very anxious, 2L O2 placed for comfort. Also had to give PRN labetalol & hydralazine for high BP in the 301'U Systolic, now back down to the 840'B systolic.  Problem: Activity: Goal: Risk for activity intolerance will decrease Outcome: Progressing Note:  Up to bathroom independently, pt refusing bed alarm, but steady on feet   Problem: Nutrition: Goal: Adequate nutrition will be maintained Outcome: Progressing   Problem: Elimination: Goal: Will not experience complications related to urinary retention Outcome: Progressing   Problem: Safety: Goal: Ability to remain free from injury will improve Outcome: Progressing

## 2018-08-13 ENCOUNTER — Other Ambulatory Visit (INDEPENDENT_AMBULATORY_CARE_PROVIDER_SITE_OTHER): Payer: Self-pay | Admitting: Vascular Surgery

## 2018-08-13 ENCOUNTER — Telehealth: Payer: PPO | Admitting: Family

## 2018-08-13 ENCOUNTER — Inpatient Hospital Stay: Payer: PPO

## 2018-08-13 DIAGNOSIS — I5021 Acute systolic (congestive) heart failure: Secondary | ICD-10-CM | POA: Diagnosis not present

## 2018-08-13 DIAGNOSIS — Z87891 Personal history of nicotine dependence: Secondary | ICD-10-CM

## 2018-08-13 DIAGNOSIS — Z992 Dependence on renal dialysis: Secondary | ICD-10-CM | POA: Diagnosis not present

## 2018-08-13 DIAGNOSIS — F419 Anxiety disorder, unspecified: Secondary | ICD-10-CM

## 2018-08-13 DIAGNOSIS — R0602 Shortness of breath: Secondary | ICD-10-CM | POA: Diagnosis not present

## 2018-08-13 DIAGNOSIS — J9601 Acute respiratory failure with hypoxia: Secondary | ICD-10-CM

## 2018-08-13 DIAGNOSIS — N186 End stage renal disease: Secondary | ICD-10-CM | POA: Diagnosis not present

## 2018-08-13 DIAGNOSIS — F411 Generalized anxiety disorder: Secondary | ICD-10-CM | POA: Diagnosis not present

## 2018-08-13 DIAGNOSIS — Z9221 Personal history of antineoplastic chemotherapy: Secondary | ICD-10-CM

## 2018-08-13 DIAGNOSIS — Z803 Family history of malignant neoplasm of breast: Secondary | ICD-10-CM

## 2018-08-13 DIAGNOSIS — Z90710 Acquired absence of both cervix and uterus: Secondary | ICD-10-CM

## 2018-08-13 DIAGNOSIS — F329 Major depressive disorder, single episode, unspecified: Secondary | ICD-10-CM | POA: Diagnosis not present

## 2018-08-13 DIAGNOSIS — Z85828 Personal history of other malignant neoplasm of skin: Secondary | ICD-10-CM

## 2018-08-13 DIAGNOSIS — G4700 Insomnia, unspecified: Secondary | ICD-10-CM | POA: Diagnosis not present

## 2018-08-13 LAB — BLOOD GAS, ARTERIAL
Acid-Base Excess: 2.6 mmol/L — ABNORMAL HIGH (ref 0.0–2.0)
Bicarbonate: 27.2 mmol/L (ref 20.0–28.0)
FIO2: 0.32
O2 Saturation: 82.1 %
Patient temperature: 37
pCO2 arterial: 41 mmHg (ref 32.0–48.0)
pH, Arterial: 7.43 (ref 7.350–7.450)
pO2, Arterial: 45 mmHg — ABNORMAL LOW (ref 83.0–108.0)

## 2018-08-13 LAB — RENAL FUNCTION PANEL
Albumin: 3 g/dL — ABNORMAL LOW (ref 3.5–5.0)
Anion gap: 11 (ref 5–15)
BUN: 53 mg/dL — ABNORMAL HIGH (ref 8–23)
CO2: 28 mmol/L (ref 22–32)
Calcium: 7.9 mg/dL — ABNORMAL LOW (ref 8.9–10.3)
Chloride: 97 mmol/L — ABNORMAL LOW (ref 98–111)
Creatinine, Ser: 4.02 mg/dL — ABNORMAL HIGH (ref 0.44–1.00)
GFR calc Af Amer: 13 mL/min — ABNORMAL LOW (ref >60)
GFR calc non Af Amer: 11 mL/min — ABNORMAL LOW (ref >60)
Glucose, Bld: 95 mg/dL (ref 70–99)
Phosphorus: 4.4 mg/dL (ref 2.5–4.6)
Potassium: 3.7 mmol/L (ref 3.5–5.1)
Sodium: 136 mmol/L (ref 135–145)

## 2018-08-13 LAB — PREPARE RBC (CROSSMATCH)

## 2018-08-13 LAB — CBC
HCT: 21.3 % — ABNORMAL LOW (ref 36.0–46.0)
Hemoglobin: 6.5 g/dL — ABNORMAL LOW (ref 12.0–15.0)
MCH: 34.8 pg — ABNORMAL HIGH (ref 26.0–34.0)
MCHC: 30.5 g/dL (ref 30.0–36.0)
MCV: 113.9 fL — ABNORMAL HIGH (ref 80.0–100.0)
Platelets: 94 10*3/uL — ABNORMAL LOW (ref 150–400)
RBC: 1.87 MIL/uL — ABNORMAL LOW (ref 3.87–5.11)
RDW: 22.3 % — ABNORMAL HIGH (ref 11.5–15.5)
WBC: 7.5 10*3/uL (ref 4.0–10.5)
nRBC: 0.3 % — ABNORMAL HIGH (ref 0.0–0.2)

## 2018-08-13 MED ORDER — METHYLPREDNISOLONE SODIUM SUCC 125 MG IJ SOLR
60.0000 mg | Freq: Two times a day (BID) | INTRAMUSCULAR | Status: DC
  Administered 2018-08-13 – 2018-08-14 (×3): 60 mg via INTRAVENOUS
  Filled 2018-08-13 (×4): qty 2

## 2018-08-13 MED ORDER — NEPRO/CARBSTEADY PO LIQD
237.0000 mL | Freq: Two times a day (BID) | ORAL | Status: DC
  Administered 2018-08-13 – 2018-08-17 (×2): 237 mL via ORAL

## 2018-08-13 MED ORDER — MIDAZOLAM HCL 2 MG/ML PO SYRP
8.0000 mg | ORAL_SOLUTION | Freq: Once | ORAL | Status: DC | PRN
  Filled 2018-08-13: qty 4

## 2018-08-13 MED ORDER — ALPRAZOLAM 1 MG PO TABS
1.0000 mg | ORAL_TABLET | Freq: Once | ORAL | Status: AC
  Administered 2018-08-13: 1 mg via ORAL
  Filled 2018-08-13: qty 1

## 2018-08-13 MED ORDER — RACEPINEPHRINE HCL 2.25 % IN NEBU
INHALATION_SOLUTION | RESPIRATORY_TRACT | Status: AC
  Administered 2018-08-13: 0.5 mL
  Filled 2018-08-13: qty 0.5

## 2018-08-13 MED ORDER — FUROSEMIDE 10 MG/ML IJ SOLN
80.0000 mg | INTRAMUSCULAR | Status: AC
  Administered 2018-08-13: 80 mg via INTRAVENOUS
  Filled 2018-08-13: qty 8

## 2018-08-13 MED ORDER — ONDANSETRON HCL 4 MG/2ML IJ SOLN: 4.0000 mg | Freq: Four times a day (QID) | INTRAMUSCULAR | Status: DC | PRN

## 2018-08-13 MED ORDER — IPRATROPIUM-ALBUTEROL 0.5-2.5 (3) MG/3ML IN SOLN
3.0000 mL | Freq: Four times a day (QID) | RESPIRATORY_TRACT | Status: DC
  Administered 2018-08-13 – 2018-08-18 (×14): 3 mL via RESPIRATORY_TRACT
  Filled 2018-08-13 (×17): qty 3

## 2018-08-13 MED ORDER — DIPHENHYDRAMINE HCL 50 MG/ML IJ SOLN: 50.0000 mg | Freq: Once | INTRAMUSCULAR | Status: DC | PRN

## 2018-08-13 MED ORDER — CLONAZEPAM 1 MG PO TABS
1.0000 mg | ORAL_TABLET | Freq: Two times a day (BID) | ORAL | Status: DC
  Administered 2018-08-13 – 2018-08-18 (×9): 1 mg via ORAL
  Filled 2018-08-13 (×9): qty 1

## 2018-08-13 MED ORDER — CEFAZOLIN SODIUM-DEXTROSE 1-4 GM/50ML-% IV SOLN
1.0000 g | Freq: Once | INTRAVENOUS | Status: DC
  Filled 2018-08-13: qty 50

## 2018-08-13 MED ORDER — RACEPINEPHRINE HCL 2.25 % IN NEBU
0.5000 mL | INHALATION_SOLUTION | RESPIRATORY_TRACT | Status: DC | PRN
  Administered 2018-08-14: 0.5 mL via RESPIRATORY_TRACT
  Filled 2018-08-13 (×2): qty 0.5

## 2018-08-13 MED ORDER — HYDROMORPHONE HCL 1 MG/ML IJ SOLN: 1.0000 mg | Freq: Once | INTRAMUSCULAR | Status: DC | PRN

## 2018-08-13 MED ORDER — ALBUTEROL SULFATE (2.5 MG/3ML) 0.083% IN NEBU
2.5000 mg | INHALATION_SOLUTION | RESPIRATORY_TRACT | Status: DC | PRN
  Filled 2018-08-13: qty 3

## 2018-08-13 MED ORDER — METHYLPREDNISOLONE SODIUM SUCC 125 MG IJ SOLR: 125.0000 mg | Freq: Once | INTRAMUSCULAR | Status: DC | PRN

## 2018-08-13 MED ORDER — SODIUM CHLORIDE 0.9 % IV SOLN: INTRAVENOUS | Status: DC

## 2018-08-13 MED ORDER — FAMOTIDINE 20 MG PO TABS: 40.0000 mg | ORAL_TABLET | Freq: Once | ORAL | Status: DC | PRN

## 2018-08-13 MED ORDER — METHYLPREDNISOLONE SODIUM SUCC 125 MG IJ SOLR
60.0000 mg | Freq: Once | INTRAMUSCULAR | Status: AC
  Administered 2018-08-13: 60 mg via INTRAVENOUS

## 2018-08-13 MED ORDER — RENA-VITE PO TABS
1.0000 | ORAL_TABLET | Freq: Every day | ORAL | Status: DC
  Administered 2018-08-13 – 2018-08-17 (×5): 1 via ORAL
  Filled 2018-08-13 (×6): qty 1

## 2018-08-13 MED ORDER — SODIUM CHLORIDE 0.9% IV SOLUTION: Freq: Once | INTRAVENOUS | Status: DC

## 2018-08-13 NOTE — Care Management Important Message (Signed)
Important Message  Patient Details  Name: Jessica Burgess MRN: [id number] Date of Birth: [birth year]   Medicare Important Message Given:  Yes    Dannette Barbara 08/13/2018, 12:35 PM

## 2018-08-13 NOTE — Progress Notes (Signed)
I have spoken to a family member for this patient at least twice today already, so no need for additional "shift" update. Patient remains in HD department at this time. Expecting return prior to end of shift. Wenda Low [facility]

## 2018-08-13 NOTE — Progress Notes (Signed)
[place] [facility] [place] NAME: Jessica Burgess    MR#:  [id number]  DATE OF BIRTH:  [birth year]  SUBJECTIVE:   Complains of shortness of breath and wheezing. She is a bit anxious. No headache today. Not eat much for breakfast due to shortness of breath REVIEW OF SYSTEMS:   Review of Systems  Constitutional: Negative for chills, fever and weight loss.  HENT: Negative for ear discharge, ear pain and nosebleeds.   Eyes: Negative for blurred vision, pain and discharge.  Respiratory: Positive for shortness of breath. Negative for sputum production, wheezing and stridor.   Cardiovascular: Negative for chest pain, palpitations, orthopnea and PND.  Gastrointestinal: Negative for abdominal pain, diarrhea, nausea and vomiting.  Genitourinary: Negative for frequency and urgency.  Musculoskeletal: Negative for back pain and joint pain.  Neurological: Positive for weakness. Negative for sensory change, speech change and focal weakness.  Psychiatric/Behavioral: Negative for depression and hallucinations. The patient is not nervous/anxious.    Tolerating Diet:yes Tolerating PT: ambulatory  DRUG ALLERGIES:  No Known Allergies  VITALS:  Blood pressure (!) 178/96, pulse 80, temperature 98 F (36.7 C), temperature source Oral, resp. rate (!) 24, height 5\' 2"  (1.575 m), weight 75.3 kg, SpO2 98 %.  PHYSICAL EXAMINATION:   Physical Exam  GENERAL:  63 y.o.-year-old patient lying in the bed with no acute distress.  EYES: Pupils equal, round, reactive to light and accommodation. No scleral icterus. Extraocular muscles intact.  HEENT: Head atraumatic, normocephalic. Oropharynx and nasopharynx clear. Puffy face NECK:  Supple, no jugular venous distention. No thyroid enlargement, no tenderness.  LUNGS: decreased breath sounds bilaterally, +wheezing, few rales,no rhonchi. No use of accessory muscles of respiration.  CARDIOVASCULAR: S1, S2 normal. No murmurs,  rubs, or gallops.  ABDOMEN: Soft, nontender, nondistended. Bowel sounds present. No organomegaly or mass.  EXTREMITIES: No cyanosis, clubbing or edema b/l.    NEUROLOGIC: Cranial nerves II through XII are intact. No focal Motor or sensory deficits b/l.   PSYCHIATRIC:  patient is alert and oriented x 3.  SKIN: No obvious rash, lesion, or ulcer.   LABORATORY PANEL:  CBC Recent Labs  Lab 08/13/18 0526  WBC 7.5  HGB 6.5*  HCT 21.3*  PLT 94*    Chemistries  Recent Labs  Lab 08/06/18 2014  08/13/18 0526  NA 138   < > 136  K 5.5*   < > 3.7  CL 107   < > 97*  CO2 20*   < > 28  GLUCOSE 148*   < > 95  BUN 69*   < > 53*  CREATININE 3.19*   < > 4.02*  CALCIUM 8.3*   < > 7.9*  AST 49*  --   --   ALT 22  --   --   ALKPHOS 66  --   --   BILITOT 1.2  --   --    < > = values in this interval not displayed.   Cardiac Enzymes Recent Labs  Lab 08/07/18 1507  TROPONINI 0.35*   RADIOLOGY:  Dg Chest 2 View  Result Date: 08/12/2018 CLINICAL DATA:  Shortness of breath EXAM: CHEST - 2 VIEW COMPARISON:  08/09/2018 CT, chest x-ray 08/07/2018 FINDINGS: Right Port-A-Cath remains in place, unchanged. Cardiomegaly with vascular congestion. Patchy opacities in the left mid and lower lung could reflect asymmetric edema or infection. Small bilateral effusions. No acute bony abnormality. IMPRESSION: Cardiomegaly with vascular congestion. Left mid and lower lung airspace opacity could  reflect asymmetric edema or infection. Small effusions. Electronically Signed   By: Rolm Baptise M.D.   On: 08/12/2018 12:03   ASSESSMENT AND PLAN:   Jessica Burgess  is a 63 y.o. female with a known history of ovarian cancer, CKD, hypothyroidism, hypertension and anemia.  Patient is currently undergoing chemotherapy followed by Dr. Mellody Drown and Dr. Grayland Ormond. Presented to the emergency room with a 2-day history of increased shortness of breath and nonproductive cough.  Shortness of breath is made worse when lying  flat.    1.  Acute diastolic CHF-BNP 2376 - Patient started on Lasix 40 mg IV twice daily-- on daily lasix IV 40 mg-- Lasix held given elevated creatinine--HD started on 08/10/2018 x2 sessions--now holding - echocardiogram  left ventricle has mildly reduced systolic function, with an ejection fraction of 45-50%. The cavity size was normal. There is mildly increased left ventricular wall thickness. Concern for anterior/anteroseptal wall hypokinesis -sats 99-100% 2 liter - appreciate cardiology input from Drs gollan/Mclean/Arida/End -added hydralazine and imdur per cardiology recommendation for after load reduction -HD started for cardiorenal syndrome on 08/10/2018--holding for now  2.  Acute on chronic renal failure-III -?cardiorenal syndrome -creat baseline 1.7 -creat 3.20--3.19--3.4--3.76--4.19--HD started (08/11/2018) --3.4 -pending renal labs ANCA,GBM,ENA,DNA/DS, AntiCH,centro, Jo  3.  Hyperkalemia - Potassium level is 5.5.  4.  High-grade ovarian cancer - Patient is status post chemotherapy with gemcitabine - seen by  Dr. Grayland Ormond   5.  Lactic acidosis- 3.4 lactic acid level--3.1--2.0 - Will repeat lactic acid level as patient is diuresed.  No other evidence of infectious process.  Blood, urine, and sputum cultures are pending.  Will treat as indicated  6.  Hypothyroidism - Synthroid continued  7. Anemia of chronic dz -recently received 2 units BT at the cancer center -hgb 6.5--BT today  8. TCP suspected due to chemo stable at 49K  9. Anxiety spells -patient has history of previous anxiety and depression in the past. Psych consult placed with Dr. Reeves Dam in put cont cymbalta, klonopin  10. Acute on chronic COPD exacerbation with significant wheezing -IV Solu-Medrol 60 BID, nebs, oxygen, inhalers    CODE STATUS: full  DVT Prophylaxis: heparin  TOTAL TIME TAKING CARE OF THIS PATIENT: *30* minutes.  >50% time spent on counselling and coordination of  care  POSSIBLE D/C IN *?* DAYS, DEPENDING ON CLINICAL CONDITION.  Note: This dictation was prepared with Dragon dictation along with smaller phrase technology. Any transcriptional errors that result from this process are unintentional.  Fritzi Mandes M.D on 08/13/2018 at 12:02 PM  Between 7am to 6pm - Pager - [phone]  After 6pm go to [email or web address] - password [email or web address] [facility]  Office  [phone]  CC: Primary care physician; Steele Sizer, MDPatient ID: [name], female   DOB: 07/14/1955, 63 y.o.   MRN: [id number]

## 2018-08-13 NOTE — Progress Notes (Addendum)
HD Tx End  2.5 liters removed during dialysis today. Ultrafiltration only.   Tx ends 30 mins early d/t venous chamber clotting, blood rinsed back. Dr. Juleen China aware - will order heparin tmro. Pts WOB has improved, "feels better", BP down 30 pts since pre dialysis pressure.  Stridor less pronounced, still some wheezing/snoring and dyspnea with exertion. Blood admin complete.  Will plan for dialysis again tomorrow.     08/13/18 1745  Hand-Off documentation  Report given to (Full Name) Richardson Landry RN 2C  Report received from (Full Name) Trellis Paganini RN  Vital Signs  Pulse Rate 96  Pulse Rate Source Monitor  Resp 19  BP (!) 169/93  BP Location Left Arm  BP Method Automatic  Patient Position (if appropriate) Sitting  Oxygen Therapy  SpO2 100 %  O2 Device Non-rebreather Mask  O2 Flow Rate (L/min) 15 L/min  Pulse Oximetry Type Continuous  Oximetry Probe Site Changed Yes  Pain Assessment  Pain Scale 0-10  Pain Score 0  Dialysis Weight  Weight 76.6 kg  Type of Weight Post-Dialysis  During Hemodialysis Assessment  Blood Flow Rate (mL/min) 200 mL/min  Arterial Pressure (mmHg) 160 mmHg  Venous Pressure (mmHg) 150 mmHg  Transmembrane Pressure (mmHg) 60 mmHg  Ultrafiltration Rate (mL/min) 1600 mL/min  Dialysate Flow Rate (mL/min) 0 ml/min  Conductivity: Machine  14  HD Safety Checks Performed Yes  Dialysis Fluid Bolus Normal Saline  Bolus Amount (mL) 250 mL  Intra-Hemodialysis Comments Tx completed  Post-Hemodialysis Assessment  Rinseback Volume (mL) 250 mL  Dialyzer Clearance Heavily streaked  Duration of HD Treatment -hour(s) 2 hour(s)  Hemodialysis Intake (mL) 500 mL  UF Total -Machine (mL) 2921 mL  Net UF (mL) 2421 mL  Tolerated HD Treatment Yes  Hemodialysis Catheter Right Femoral vein Double-lumen  Placement Date/Time: 08/10/18 1514   Placed prior to admission: No  Time Out: Correct patient;Correct procedure;Correct site  Maximum sterile barrier precautions: Hand  hygiene;Cap;Mask;Sterile gown;Sterile gloves;Large sterile sheet  Site Prep: Chlorh...  Site Condition No complications  Blue Lumen Status Flushed;Capped (Central line);Heparin locked  Red Lumen Status Flushed;Capped (Central line);Heparin locked  Catheter fill solution Heparin 1000 units/ml  Catheter fill volume (Arterial) 1.5 cc  Catheter fill volume (Venous) 1.5  Dressing Type Biopatch;Occlusive  Dressing Status Clean;Dry;Intact  Drainage Description None  Dressing Change Due 08/14/18  Post treatment catheter status Capped and Clamped

## 2018-08-13 NOTE — Consult Note (Signed)
Name: Jessica Burgess MRN: [id number] DOB: [birth year]    ADMISSION DATE:  08/06/2018 CONSULTATION DATE: 08/13/2018  REFERRING MD : Dr. Harvest Burgess   CHIEF COMPLAINT: Shortness of Breath   BRIEF PATIENT DESCRIPTION:  63 yo female with ESRD on HD and ovarian cancer on chemotherapy admitted with worsening acute respiratory failure secondary to bilateral pleural effusions and interstitial edema requiring Bipap.  Pts respiratory status stabilized transferred to the telemetry unit on 05/23. On 05/29 pt developed worsening respiratory failure secondary to pulmonary edema and stridor requiring Bipap and transfer to the stepdown unit   SIGNIFICANT EVENTS/STUDIES:  05/22-Pt admitted stepdown unit  05/22-Venous doppler BLE revealed no evidence of DVT within either lower extremity. Incidentally noted approximately 3.6 cm left-sided Baker's cyst. 05/22-Echo EF 40 to 45% with left ventricular diastolic doppler parameters consistent with impaired relaxation  05/23-Pt transferred to the telemetry unit  05/24-CT Chest revealed scattered bilateral ground-glass and clustered nodular pulmonary opacity, most conspicuous in the bilateral upper lobes and new  compared to prior PET-CT dated 05/07/2018 (series 3, image 58). Small bilateral pleural effusions and associated atelectasis or consolidation. Findings are consistent with atypical infection or inflammation. Drug toxicity is a significant differential consideration in the setting of malignancy. There is a new 3 mm subpleural pulmonary nodule of the left pulmonary apex (series 3, image 23) and a new 4 mm subpleural nodule of the right middle lobe (series 3, image 70). These are nonspecific and may be infectious or inflammatory although concerning for metastatic disease in the setting of primary ovarian malignancy. Attention on follow-up. 05/29-Pt transferred back to the stepdown unit with acute respiratory failure secondary to severe pulmonary edema and stridor  requiring Bipap   HISTORY OF PRESENT ILLNESS:   Jessica Burgess is a 63 year old female with a past medical history notable for ovarian cancer, CKD, HTN, Hypothyroidism, and Anemia who presented to [facility] ED on 08/06/2018 with complaints of shortness of breath and nonproductive cough.  She reported that the shortness of breath had progressed over approximately 3 weeks prior to presentation, along with a 5 pound weight gain 1 week prior to presentation. She reported she underwent chemotherapy infusion, blood transfusion and received IV fluids at the cancer center 07/27/2018. In the ER she was noted to be tachypneic, tachycardic, with increased work of breathing, and hypertensive requiring Bipap. Chest x-ray revealed cardiomegaly, mild vascular congestion and diffuse interstitial opacities.  She was subsequently admitted to the stepdown unit by hospitalist team for additional workup and treatment.  Her respiratory status improved following aggressive treatment and she was transferred to the telemetry unit on 05/23.  On 05/29 pt developed worsening acute respiratory failure secondary to pulmonary edema and stridor requiring Bipap and transfer to the stepdown unit.   PAST MEDICAL HISTORY :   has a past medical history of Allergic rhinitis, cause unspecified, Anxiety state, unspecified, Arthritis, Asthma, Backache, unspecified, Bronchitis, Cancer ([place]), Cancer ([place]) (11/2016), Cervicalgia, Complication of anesthesia, Dermatophytosis of nail, Dysmetabolic syndrome X, Encounter for long-term (current) use of other medications, Esophageal reflux, Insomnia, unspecified, Leukocytosis, unspecified, Migraine without aura, without mention of intractable migraine without mention of status migrainosus, Other and unspecified hyperlipidemia, Other malaise and fatigue, Overweight(278.02), Personal history of chemotherapy (now), PONV (postoperative nausea and vomiting), Spinal stenosis in cervical region, Symptomatic menopausal or  female climacteric states, Unspecified disorder of skin and subcutaneous tissue, and Unspecified vitamin D deficiency.  has a past surgical history that includes Neck surgery; Back surgery; Tonsillectomy; Abdominal hysterectomy; Anterior cervical decomp/discectomy fusion (N/A,  06/07/2015); and Evacuation of cercical hematoma (N/A, 06/14/2015). Prior to Admission medications   Medication Sig Start Date End Date Taking? Authorizing Provider  Ascorbic Acid (VITAMIN C PO) Take 1 tablet by mouth daily.    Yes [provider]  budesonide-formoterol (SYMBICORT) 160-4.5 MCG/ACT inhaler Inhale 2 puffs into the lungs 2 (two) times daily.   Yes [provider]  Cholecalciferol (VITAMIN D-1000 MAX ST) 1000 UNITS tablet Take 1 tablet by mouth 2 (two) times daily.   Yes [provider]  Cyanocobalamin (B-12 PO) Take 1 tablet by mouth daily.    Yes [provider]  DULoxetine (CYMBALTA) 60 MG capsule TAKE 1 CAPSULE(60 MG) BY MOUTH DAILY 06/11/18  Yes Sowles, Drue Stager, MD  estradiol (ESTRACE) 0.5 MG tablet Take 1 tablet (0.5 mg total) by mouth daily. 10/28/17  Yes Sowles, Drue Stager, MD  ferrous sulfate (IRON SUPPLEMENT) 325 (65 FE) MG tablet Take 1 tablet by mouth daily.   Yes [provider]  gabapentin (NEURONTIN) 300 MG capsule Take 1-3 capsules (300-900 mg total) by mouth 3 (three) times daily. 300 mg twice a day and 900 mg at night 06/11/18  Yes Sowles, Drue Stager, MD  ipratropium (ATROVENT HFA) 17 MCG/ACT inhaler Inhale 2 puffs into the lungs every 6 (six) hours as needed.    Yes [provider]  levothyroxine (SYNTHROID) 25 MCG tablet Take 1 tablet (25 mcg total) by mouth daily before breakfast. 07/21/18  Yes Borders, Kirt Boys, NP  Lysine 1000 MG TABS Take 1 tablet by mouth daily.    Yes [provider]  Magnesium Oxide 500 MG CAPS Take 500 mg by mouth 2 (two) times daily.    Yes [provider]  Multiple Vitamins-Minerals (MULTIVITAMIN PO) Take 1  tablet by mouth daily.    Yes [provider]  Omega-3 Fatty Acids (FISH OIL PO) Take 1 capsule by mouth 2 (two) times daily.    Yes [provider]  oxyCODONE-acetaminophen (PERCOCET) 10-325 MG tablet Take 1 tablet by mouth every 6 (six) hours as needed for pain. 07/20/18  Yes Borders, Kirt Boys, NP  potassium gluconate 595 MG TABS tablet Take 595 mg by mouth daily.   Yes [provider]  temazepam (RESTORIL) 15 MG capsule TAKE 1 CAPSULE(15 MG) BY MOUTH AT BEDTIME AS NEEDED FOR SLEEP 07/13/18  Yes Sowles, Drue Stager, MD  albuterol (PROVENTIL HFA;VENTOLIN HFA) 108 (90 Base) MCG/ACT inhaler Inhale 2 puffs into the lungs every 6 (six) hours as needed for wheezing or shortness of breath. 10/04/16   Hubbard Hartshorn, FNP  albuterol (PROVENTIL) (2.5 MG/3ML) 0.083% nebulizer solution Take 3 mLs (2.5 mg total) by nebulization every 6 (six) hours as needed for wheezing or shortness of breath. 05/05/15   Steele Sizer, MD  cyclobenzaprine (FLEXERIL) 10 MG tablet Take one tablet every 8 hours as needed 08/03/18   Lloyd Huger, MD  fluticasone [facility]) 50 MCG/ACT nasal spray Place 2 sprays into both nostrils as needed. 06/11/18 09/09/18  Steele Sizer, MD  lidocaine (XYLOCAINE) 2 % solution Use as directed 15 mLs in the mouth or throat as needed for mouth pain. 11/28/17   Jacquelin Hawking, NP  ondansetron (ZOFRAN-ODT) 8 MG disintegrating tablet Take 1 tablet (8 mg total) by mouth every 8 (eight) hours as needed for nausea or vomiting. 07/20/18   Borders, Kirt Boys, NP  SUMAtriptan (IMITREX) 100 MG tablet May repeat in 2 hours if headache persists or recurs. 06/10/18   Steele Sizer, MD  valACYclovir (VALTREX) 1000 MG  tablet TAKE 1 TABLET(1000 MG) BY MOUTH THREE TIMES DAILY FOR 7 DAYS Patient not taking: Reported on 08/03/2018 11/24/17   Lloyd Huger, MD  prochlorperazine (COMPAZINE) 10 MG tablet Take 1 tablet (10 mg total) by mouth every 6 (six) hours as needed (Nausea or vomiting).  12/02/16 08/08/17  Lloyd Huger, MD   No Known Allergies  FAMILY HISTORY:  family history includes Breast cancer in her paternal aunt; Dementia in her father; Depression in her mother; Diabetes in her father; Hyperlipidemia in her brother, brother, and father; Migraines in her mother. SOCIAL HISTORY:  reports that she quit smoking about 18 years ago. Her smoking use included cigarettes. She started smoking about 39 years ago. She has a 30.00 pack-year smoking history. She has never used smokeless tobacco. She reports previous alcohol use. She reports that she does not use drugs.  REVIEW OF SYSTEMS: Positives in BOLD  Constitutional: Negative for fever, chills, weight loss, malaise/fatigue and diaphoresis.  HENT: Negative for hearing loss, ear pain, nosebleeds, congestion, sore throat, neck pain, tinnitus and ear discharge.   Eyes: Negative for blurred vision, double vision, photophobia, pain, discharge and redness.  Respiratory: cough, hemoptysis, sputum production, shortness of breath, wheezing and stridor.   Cardiovascular: Negative for chest pain, palpitations, orthopnea, claudication, leg swelling and PND.  Gastrointestinal: Negative for heartburn, nausea, vomiting, abdominal pain, diarrhea, constipation, blood in stool and melena.  Genitourinary: Negative for dysuria, urgency, frequency, hematuria and flank pain.  Musculoskeletal: Negative for myalgias, back pain, joint pain and falls.  Skin: Negative for itching and rash.  Neurological: Negative for dizziness, tingling, tremors, sensory change, speech change, focal weakness, seizures, loss of consciousness, weakness and headaches.  Endo/Heme/Allergies: Negative for environmental allergies and polydipsia. Does not bruise/bleed easily.  SUBJECTIVE:  Pt currently complaining of severe shortness of breath and anxiety   VITAL SIGNS: Temp:  [97.6 F (36.4 C)-98.4 F (36.9 C)] 97.6 F (36.4 C) (05/28 1555) Pulse Rate:  [80-100] 95  (05/28 1730) Resp:  [17-30] 17 (05/28 1700) BP: (169-217)/(89-130) 217/102 (05/28 1700) SpO2:  [93 %-100 %] 100 % (05/28 1730) Weight:  [75.3 kg-79 kg] 79 kg (05/28 1555)  PHYSICAL EXAMINATION: General: well developed, well nourished female in severe respiratory distress on Bipap  Neuro: alert and oriented, follows commands  HEENT: JVD present  Cardiovascular: nsr, rrr, no R/G Lungs: diffuse crackles throughout, labored, tachypneic  Abdomen: +BS x4, obese, soft, non tender, non distended  Musculoskeletal: normal bulk and tone, no edema  Skin: right chest portacath and right femoral dialysis catheter   Recent Labs  Lab 08/11/18 0407 08/12/18 0904 08/13/18 0526  NA 137 135 136  K 4.4 4.0 3.7  CL 102 98 97*  CO2 23 27 28   BUN 72* 47* 53*  CREATININE 3.46* 3.36* 4.02*  GLUCOSE 96 168* 95   Recent Labs  Lab 08/07/18 0340 08/08/18 0211 08/13/18 0526  HGB 8.7* 7.5* 6.5*  HCT 27.2* 23.4* 21.3*  WBC 13.0* 8.8 7.5  PLT 49* 49* 94*   Dg Chest 2 View  Result Date: 08/12/2018 CLINICAL DATA:  Shortness of breath EXAM: CHEST - 2 VIEW COMPARISON:  08/09/2018 CT, chest x-ray 08/07/2018 FINDINGS: Right Port-A-Cath remains in place, unchanged. Cardiomegaly with vascular congestion. Patchy opacities in the left mid and lower lung could reflect asymmetric edema or infection. Small bilateral effusions. No acute bony abnormality. IMPRESSION: Cardiomegaly with vascular congestion. Left mid and lower lung airspace opacity could reflect asymmetric edema or infection. Small effusions. Electronically Signed  By: Rolm Baptise M.D.   On: 08/12/2018 12:03   Dg Chest Port 1 View  Result Date: 08/13/2018 CLINICAL DATA:  Shortness of breath. EXAM: PORTABLE CHEST 1 VIEW COMPARISON:  Radiographs of Aug 12, 2018. FINDINGS: Stable cardiomegaly with central pulmonary vascular congestion. Right internal jugular Port-A-Cath is again noted. No pneumothorax is noted. Increased interstitial densities are noted  throughout both lungs most consistent with pulmonary edema. Small bilateral pleural effusions are noted. Bony thorax is unremarkable. IMPRESSION: Stable cardiomegaly with central pulmonary vascular congestion. Increased bilateral interstitial densities are noted consistent with worsening pulmonary edema. Small bilateral pleural effusions are noted. Electronically Signed   By: Marijo Conception M.D.   On: 08/13/2018 13:57    ASSESSMENT / PLAN:  Acute hypoxic respiratory failure with stridor secondary to acute diastolic CHF exacerbation and possible upper airway edema  Hx: Asthma Supplemental O2 for dyspnea and/or hypoxia  Scheduled and prn bronchodilator therapy  Prn racepinephrine for stridor  Continue iv solumedrol   Acute diastolic CHF exacerbation  Hypertension Continuous telemetry monitoring  Continue outpatient antihypertensives and atorvastatin Cardiology consulted appreciate input   ESRD on HD  Trend BMP  Replace electrolytes as indicated  Avoid nephrotoxic medications  Nephrology consulted appreciate input-Urgent HD pending  Lasix gtt per nephrology recommendations   Leukocytosis-resolved (blood and urine cultures negative) Trend WBC and monitor fever curve   Anemia without signs and symptoms of bleeding Thrombocytopenia Ovarian Cancer currently undergoing chemotherapy  VTE px: SCD's avoid chemical prophylaxis  Continue ferrous sulfate  Trend CBC  Transfuse for hgb <7 and/or s/sx of bleeding  Oncology consulted appreciate input   GERD Continue famotidine   Anxiety  Continue klonopin

## 2018-08-13 NOTE — Progress Notes (Signed)
TB test read 08/13/2018 0645, result negative.

## 2018-08-13 NOTE — Progress Notes (Signed)
HD Tx Start  Urgent dialysis initiated via right groin catheter. Stridor and severe SOB. NRB @15  liters satting 100 - accompanied by RN from floor, along with transporter d/t acuity and blood transfusing.   Goal removal = 3 liters. No toxin removal (only fluid removal/"sequential" tx) per MD. 2.5 hours.     08/13/18 1555  Hand-Off documentation  Report given to (Full Name) Trellis Paganini RN  Report received from (Full Name) Salome Spotted RN 2C  Vital Signs  Temp 97.6 F (36.4 C)  Temp Source Axillary  Pulse Rate 89  Pulse Rate Source Monitor  Resp (!) 29  BP (!) 208/110  BP Location Left Arm  BP Method Automatic  Patient Position (if appropriate) Sitting  Oxygen Therapy  SpO2 100 %  O2 Device Non-rebreather Mask  O2 Flow Rate (L/min) 15 L/min  Pulse Oximetry Type Continuous  Oximetry Probe Site Changed Yes  Pain Assessment  Pain Scale 0-10  Pain Score 0  Dialysis Weight  Weight 79 kg  Type of Weight Pre-Dialysis  Time-Out for Hemodialysis  What Procedure? Hemodialysis  Pt Identifiers(min of two) First/Last Name;MRN/Account#  Correct Site? Yes  Correct Side? Yes  Correct Procedure? Yes  Consents Verified? Yes  Rad Studies Available? N/A  Safety Precautions Reviewed? Yes  Engineer, civil (consulting) Number 1  Station Number 1  UF/Alarm Test Passed  Conductivity: Meter 14  Conductivity: Machine  14  pH 7.4  Reverse Osmosis Main  Normal Saline Lot Number [id number]  Dialyzer Lot Number [id number]  Disposable Set Lot Number [id number]  Machine Temperature 98.6 F (37 C)  Musician and Audible Yes  Blood Lines Intact and Secured Yes  Pre Treatment Patient Checks  Vascular access used during treatment Catheter  Patient is receiving dialysis in a chair Yes  Hepatitis B Surface Antigen Results Negative  Date Hepatitis B Surface Antigen Drawn 08/10/18  Hepatitis B Surface Antibody  (>10)  Date Hepatitis B Surface Antibody Drawn 08/10/18  Hemodialysis Consent Verified  Yes  Hemodialysis Standing Orders Initiated Yes  ECG (Telemetry) Monitor On Yes  Prime Ordered Normal Saline  Length of  DialysisTreatment -hour(s) 2.5 Hour(s)  Dialysis Treatment Comments SEQUENTIAL, Na 140  Dialyzer Elisio 17H NR  Dialysate Other (Comment) (SEQUENTIAL)  Dialysate Flow Ordered 0 (SEQUENTIAL )  Blood Flow Rate Ordered 300 mL/min (d/t heart condition and only sequential needed/fluid removed)  Ultrafiltration Goal 3 Liters  Dialysis Blood Pressure Support Ordered Normal Saline  During Hemodialysis Assessment  Blood Flow Rate (mL/min) 300 mL/min  Arterial Pressure (mmHg) -130 mmHg  Venous Pressure (mmHg) 150 mmHg  Transmembrane Pressure (mmHg) 60 mmHg  Ultrafiltration Rate (mL/min) 1600 mL/min (per MD)  Dialysate Flow Rate (mL/min) 0 ml/min (SEQUENTIAL)  Conductivity: Machine  14  HD Safety Checks Performed Yes  Dialysis Fluid Bolus Normal Saline  Bolus Amount (mL) 250 mL  Intra-Hemodialysis Comments Tx initiated  Education / Care Plan  Dialysis Education Provided Yes  Documented Education in Care Plan Yes  Hemodialysis Catheter Right Femoral vein Double-lumen  Placement Date/Time: 08/10/18 1514   Placed prior to admission: No  Time Out: Correct patient;Correct procedure;Correct site  Maximum sterile barrier precautions: Hand hygiene;Cap;Mask;Sterile gown;Sterile gloves;Large sterile sheet  Site Prep: Chlorh...  Site Condition No complications  Blue Lumen Status Infusing  Red Lumen Status Capped (Central line);Infusing  Dressing Type Biopatch;Gauze/Drain sponge;Occlusive  Dressing Status Clean;Dry;Intact  Drainage Description None

## 2018-08-13 NOTE — Progress Notes (Signed)
Progress Note  Patient Name: Jessica Burgess Date of Encounter: 08/13/2018  Primary Cardiologist: [place]  Subjective   Patient feels very short of breath today.  Also wheezing.  Reports chest wall is becoming sore from increased WOB.  +orthopnea.  Inpatient Medications    Scheduled Meds: . sodium chloride   Intravenous Once  . atorvastatin  40 mg Oral q1800  . Chlorhexidine Gluconate Cloth  6 each Topical Q0600  . clonazePAM  1 mg Oral BID  . DULoxetine  60 mg Oral Daily  . feeding supplement (NEPRO CARB STEADY)  237 mL Oral BID BM  . ferrous sulfate  325 mg Oral Daily  . furosemide  80 mg Intravenous STAT  . hydrALAZINE  25 mg Oral TID  . ipratropium-albuterol  3 mL Nebulization Q6H  . isosorbide mononitrate  30 mg Oral Daily  . levothyroxine  25 mcg Oral Q0600  . methylPREDNISolone (SOLU-MEDROL) injection  60 mg Intravenous Q12H  . metoprolol succinate  50 mg Oral Daily  . mometasone-formoterol  2 puff Inhalation BID  . multivitamin  1 tablet Oral QHS  . sodium chloride flush  10 mL Intravenous Q12H  . sodium chloride flush  3 mL Intravenous Q12H   Continuous Infusions: . sodium chloride    . sodium chloride    .  ceFAZolin (ANCEF) IV     PRN Meds: sodium chloride, acetaminophen, albuterol, cyclobenzaprine, diphenhydrAMINE, famotidine, fluticasone, hydrALAZINE, HYDROmorphone (DILAUDID) injection, labetalol, lidocaine, methylPREDNISolone (SOLU-MEDROL) injection, midazolam, ondansetron (ZOFRAN) IV, [DISCONTINUED] oxyCODONE-acetaminophen **AND** oxyCODONE, Racepinephrine HCl, sodium chloride flush, SUMAtriptan   Vital Signs    Vitals:   08/13/18 0847 08/13/18 1342 08/13/18 1401 08/13/18 1419  BP:  (!) 179/109  (!) 198/130  Pulse: 80 92 99 100  Resp: (!) 24 20 (!) 30 (!) 28  Temp:  97.8 F (36.6 C)  97.8 F (36.6 C)  TempSrc:  Oral  Oral  SpO2: 98% 98% 100% 100%  Weight:      Height:        Intake/Output Summary (Last 24 hours) at 08/13/2018 1536 Last  data filed at 08/13/2018 1419 Gross per 24 hour  Intake 30 ml  Output 600 ml  Net -570 ml   Last 3 Weights 08/13/2018 08/12/2018 08/11/2018  Weight (lbs) 166 lb 1.6 oz 165 lb 14.4 oz 167 lb 5.3 oz  Weight (kg) 75.342 kg 75.252 kg 75.9 kg      Telemetry    NSR and sinus tachycardia - Personally Reviewed  ECG    08/12/2018: NSR without significant abnormality - Personally Reviewed  Physical Exam   GEN: Anxious appearing in respiratory distress. Neck: JVP difficult to assess due to accessory muscle use but appears to be at least moderately elevated Cardiac: Distant heart sounds.  RRR Respiratory: Diminished throughout with bibasilar crackles and scattered rhonchi. GI: Soft, nontender, non-distended  MS: No edema; No deformity. Neuro:  Nonfocal  Psych: Normal affect   Labs    Chemistry Recent Labs  Lab 08/06/18 2014  08/11/18 0407 08/12/18 0904 08/13/18 0526  NA 138   < > 137 135 136  K 5.5*   < > 4.4 4.0 3.7  CL 107   < > 102 98 97*  CO2 20*   < > 23 27 28   GLUCOSE 148*   < > 96 168* 95  BUN 69*   < > 72* 47* 53*  CREATININE 3.19*   < > 3.46* 3.36* 4.02*  CALCIUM 8.3*   < >  8.1* 8.3* 7.9*  PROT 6.5  --   --   --   --   ALBUMIN 3.3*  --   --  3.1* 3.0*  AST 49*  --   --   --   --   ALT 22  --   --   --   --   ALKPHOS 66  --   --   --   --   BILITOT 1.2  --   --   --   --   GFRNONAA 15*   < > 13* 14* 11*  GFRAA 17*   < > 15* 16* 13*  ANIONGAP 11   < > 12 10 11    < > = values in this interval not displayed.     Hematology Recent Labs  Lab 08/07/18 0340 08/08/18 0211 08/13/18 0526  WBC 13.0* 8.8 7.5  RBC 2.53* 2.20* 1.87*  HGB 8.7* 7.5* 6.5*  HCT 27.2* 23.4* 21.3*  MCV 107.5* 106.4* 113.9*  MCH 34.4* 34.1* 34.8*  MCHC 32.0 32.1 30.5  RDW 24.0* 23.2* 22.3*  PLT 49* 49* 94*    Cardiac Enzymes Recent Labs  Lab 08/06/18 2014 08/07/18 0340 08/07/18 0832 08/07/18 1507  TROPONINI 0.06* 1.10* 0.81* 0.35*   No results for input(s): TROPIPOC in the last  168 hours.   BNP Recent Labs  Lab 08/06/18 2015 08/07/18 0340  BNP 1,407.0* 2,727.0*     DDimer No results for input(s): DDIMER in the last 168 hours.   Radiology    Dg Chest 2 View  Result Date: 08/12/2018 CLINICAL DATA:  Shortness of breath EXAM: CHEST - 2 VIEW COMPARISON:  08/09/2018 CT, chest x-ray 08/07/2018 FINDINGS: Right Port-A-Cath remains in place, unchanged. Cardiomegaly with vascular congestion. Patchy opacities in the left mid and lower lung could reflect asymmetric edema or infection. Small bilateral effusions. No acute bony abnormality. IMPRESSION: Cardiomegaly with vascular congestion. Left mid and lower lung airspace opacity could reflect asymmetric edema or infection. Small effusions. Electronically Signed   By: Rolm Baptise M.D.   On: 08/12/2018 12:03   Dg Chest Port 1 View  Result Date: 08/13/2018 CLINICAL DATA:  Shortness of breath. EXAM: PORTABLE CHEST 1 VIEW COMPARISON:  Radiographs of Aug 12, 2018. FINDINGS: Stable cardiomegaly with central pulmonary vascular congestion. Right internal jugular Port-A-Cath is again noted. No pneumothorax is noted. Increased interstitial densities are noted throughout both lungs most consistent with pulmonary edema. Small bilateral pleural effusions are noted. Bony thorax is unremarkable. IMPRESSION: Stable cardiomegaly with central pulmonary vascular congestion. Increased bilateral interstitial densities are noted consistent with worsening pulmonary edema. Small bilateral pleural effusions are noted. Electronically Signed   By: Marijo Conception M.D.   On: 08/13/2018 13:57    Cardiac Studies   Echo (08/07/2018):  1. Challenging images  2. The left ventricle has mildly reduced systolic function, with an ejection fraction of 40 to 45%. The cavity size was normal. There is mild to moderately increased left ventricular wall thickness. Concern for anterior/anteroseptal wall hypokinesis on  select images, though difficult to visualize. Left  ventricular diastolic doppler parameters are consistent with impaired relaxation.  3. The right ventricle has normal systolic function. The cavity was normal. There is no increase in right ventricular wall thickness.Unable to estimate RVSP  4. Left atrial size was mild-moderately dilated.  5. Aortic valve regurgitation is mild to moderate by color flow Doppler.  6. Trivial pericardial effusion is present.  Patient Profile     63 y.o. female with  history of ovarian cancer and gemcitabine with associated anemia and thrombocytopenia, chronic kidney disease, essential hypertension, asthma and migraine disorder who is being followed for acute systolic heart failure and acute on chronic kidney disease.  Assessment & Plan    Acute respiratory failure with hypoxia: Most likely due to volume overload and pulmonary edema.  TRALI also a consideration, though it sounds like acute symptoms began before PRBC transfusion was started.  Plan for HD today; recommend removal of as much fluid as tolerated.  Will order furosemide 80 mg IV x 1 while awaiting dialysis.  May need to slow or pause PRBC transfusion pending HD.  Acute systolic heart failure:  Volume will need to be managed by HD given renal insufficiency.  Continue hydralazine/isosorbide.  Metoprolol held (? Wheezing).  Consider restarting metoprolol succinate following dialysis if breathing/wheezing improved.  No plans for ischemia w/u right now given anemia and thrombocytopenia.  For questions or updates, please contact [place] Please consult [email or web address] for contact info under [facility] Cardiology.     Signed, Nelva Bush, MD  08/13/2018, 3:36 PM

## 2018-08-13 NOTE — Progress Notes (Signed)
Post HD Assessment  2.5 liters removed during dialysis today. Ultrafiltration only.   Tx ends 30 mins early d/t venous chamber clotting, blood rinsed back. Dr. Juleen China aware - will order heparin tmro. Pts WOB has improved, "feels better", BP down 30 pts since pre dialysis pressure.  Stridor less pronounces, still some snoring and dyspnea with exertion. Blood admin complete.  Will plan for dialysis again tomorrow.   08/13/18 1817  Neurological  Level of Consciousness Alert  Orientation Level Oriented X4  Respiratory  Respiratory Pattern Dyspnea with exertion  Chest Assessment Chest expansion symmetrical  Bilateral Breath Sounds Diminished;Expiratory wheezes  Cardiac  Pulse Regular  Heart Sounds S1, S2  ECG Monitor Yes  Cardiac Rhythm NSR  Vascular  R Radial Pulse +2  L Radial Pulse +2  Integumentary  Integumentary (WDL) X  Skin Color Appropriate for ethnicity  Skin Condition Dry  Skin Integrity Catheter entry/exit site  Musculoskeletal  Musculoskeletal (WDL) X  Generalized Weakness Yes  Gastrointestinal  Bowel Sounds Assessment Active  GU Assessment  Genitourinary (WDL) X  Genitourinary Symptoms Oliguria (HD pt)  Urine Characteristics  Urine Color Yellow/straw  Psychosocial  Psychosocial (WDL) WDL

## 2018-08-13 NOTE — Progress Notes (Addendum)
[facility]  ROUNDING NOTE   Subjective:   UOP 1100  Complains of anxiety, shortness of breath, and chest pain.   Hemoglobin 6.5   Objective:  Vital signs in last 24 hours:  Temp:  [97.7 F (36.5 C)-98.4 F (36.9 C)] 98 F (36.7 C) (05/28 0738) Pulse Rate:  [80-89] 80 (05/28 0847) Resp:  [18-24] 24 (05/28 0847) BP: (129-183)/(67-102) 178/96 (05/28 0738) SpO2:  [93 %-100 %] 98 % (05/28 0847) Weight:  [75.3 kg] 75.3 kg (05/28 0411)  Weight change: -0.558 kg Filed Weights   08/11/18 1215 08/12/18 0500 08/13/18 0411  Weight: 75.9 kg 75.3 kg 75.3 kg    Intake/Output: I/O last 3 completed shifts: In: 250 [P.O.:240; I.V.:10] Out: 1500 [Urine:1500]   Intake/Output this shift:  No intake/output data recorded.  Physical Exam: General: NAD,   Head: Normocephalic, atraumatic. Moist oral mucosal membranes  Eyes: Anicteric, PERRL  Neck: Supple, trachea midline  Lungs:  Clear to auscultation, 3 L [place] O2  Heart: Regular rate and rhythm  Abdomen:  Soft, nontender,   Extremities:  no peripheral edema.  Neurologic: Nonfocal, moving all four extremities  Skin: No lesions  Access: Right femoral temp cath 5/25 Dr. Trula Slade    Basic Metabolic Panel: Recent Labs  Lab 08/09/18 0531 08/10/18 0539 08/10/18 1640 08/11/18 0407 08/12/18 0904 08/13/18 0526  NA 137 134*  --  137 135 136  K 5.0 4.4  --  4.4 4.0 3.7  CL 104 103  --  102 98 97*  CO2 23 22  --  23 27 28   GLUCOSE 106* 123*  --  96 168* 95  BUN 89* 90*  --  72* 47* 53*  CREATININE 3.76* 4.13*  --  3.46* 3.36* 4.02*  CALCIUM 8.2* 7.9*  --  8.1* 8.3* 7.9*  PHOS  --   --  5.5*  --  3.9 4.4    Liver Function Tests: Recent Labs  Lab 08/06/18 2014 08/12/18 0904 08/13/18 0526  AST 49*  --   --   ALT 22  --   --   ALKPHOS 66  --   --   BILITOT 1.2  --   --   PROT 6.5  --   --   ALBUMIN 3.3* 3.1* 3.0*   No results for input(s): LIPASE, AMYLASE in the last 168 hours. No results for input(s): AMMONIA  in the last 168 hours.  CBC: Recent Labs  Lab 08/06/18 2014 08/07/18 0340 08/08/18 0211 08/13/18 0526  WBC 12.8* 13.0* 8.8 7.5  NEUTROABS 10.6*  --  5.6  --   HGB 8.6* 8.7* 7.5* 6.5*  HCT 26.5* 27.2* 23.4* 21.3*  MCV 105.6* 107.5* 106.4* 113.9*  PLT 58* 49* 49* 94*    Cardiac Enzymes: Recent Labs  Lab 08/06/18 2014 08/07/18 0340 08/07/18 0832 08/07/18 1507  TROPONINI 0.06* 1.10* 0.81* 0.35*    BNP: Invalid input(s): POCBNP  CBG: Recent Labs  Lab 08/07/18 0244  GLUCAP 111*    Microbiology: Results for orders placed or performed during the hospital encounter of 08/06/18  SARS Coronavirus 2 (CEPHEID - Performed in [facility] hospital lab), Hosp Order     Status: None   Collection Time: 08/06/18  8:15 PM  Result Value Ref Range Status   SARS Coronavirus 2 NEGATIVE NEGATIVE Final    Comment: (NOTE) If result is NEGATIVE SARS-CoV-2 target nucleic acids are NOT DETECTED. The SARS-CoV-2 RNA is generally detectable in upper and lower  respiratory specimens during the acute phase  of infection. The lowest  concentration of SARS-CoV-2 viral copies this assay can detect is 250  copies / mL. A negative result does not preclude SARS-CoV-2 infection  and should not be used as the sole basis for treatment or other  patient management decisions.  A negative result may occur with  improper specimen collection / handling, submission of specimen other  than nasopharyngeal swab, presence of viral mutation(s) within the  areas targeted by this assay, and inadequate number of viral copies  (<250 copies / mL). A negative result must be combined with clinical  observations, patient history, and epidemiological information. If result is POSITIVE SARS-CoV-2 target nucleic acids are DETECTED. The SARS-CoV-2 RNA is generally detectable in upper and lower  respiratory specimens dur ing the acute phase of infection.  Positive  results are indicative of active infection with SARS-CoV-2.   Clinical  correlation with patient history and other diagnostic information is  necessary to determine patient infection status.  Positive results do  not rule out bacterial infection or co-infection with other viruses. If result is PRESUMPTIVE POSTIVE SARS-CoV-2 nucleic acids MAY BE PRESENT.   A presumptive positive result was obtained on the submitted specimen  and confirmed on repeat testing.  While 2019 novel coronavirus  (SARS-CoV-2) nucleic acids may be present in the submitted sample  additional confirmatory testing may be necessary for epidemiological  and / or clinical management purposes  to differentiate between  SARS-CoV-2 and other Sarbecovirus currently known to infect humans.  If clinically indicated additional testing with an alternate test  methodology [phone]) is advised. The SARS-CoV-2 RNA is generally  detectable in upper and lower respiratory sp ecimens during the acute  phase of infection. The expected result is Negative. Fact Sheet for Patients:  StrictlyIdeas.no Fact Sheet for Healthcare Providers: BankingDealers.co.za This test is not yet approved or cleared by the [place] FDA and has been authorized for detection and/or diagnosis of SARS-CoV-2 by FDA under an Emergency Use Authorization (EUA).  This EUA will remain in effect (meaning this test can be used) for the duration of the COVID-19 declaration under Section 564(b)(1) of the Act, 21 U.S.C. section 360bbb-3(b)(1), unless the authorization is terminated or revoked sooner. Performed at [facility], [address] [place], [place] [address]   MRSA PCR Screening     Status: None   Collection Time: 08/07/18  2:38 AM  Result Value Ref Range Status   MRSA by PCR NEGATIVE NEGATIVE Final    Comment:        The GeneXpert MRSA Assay (FDA approved for NASAL specimens only), is one component of a comprehensive MRSA colonization surveillance  program. It is not intended to diagnose MRSA infection nor to guide or monitor treatment for MRSA infections. Performed at [facility], [address] [place], [place] [address]   CULTURE, BLOOD (ROUTINE X 2) w Reflex to ID Panel     Status: None   Collection Time: 08/07/18  3:40 AM  Result Value Ref Range Status   Specimen Description BLOOD LEFT ANTECUBITAL  Final   Special Requests   Final    BOTTLES DRAWN AEROBIC AND ANAEROBIC Blood Culture results may not be optimal due to an excessive volume of blood received in culture bottles   Culture   Final    NO GROWTH 5 DAYS Performed at [facility], [address], [place], [place] [address]    Report Status 08/12/2018 FINAL  Final  CULTURE, BLOOD (ROUTINE X 2) w Reflex  to ID Panel     Status: None   Collection Time: 08/07/18  3:40 AM  Result Value Ref Range Status   Specimen Description BLOOD BLOOD LEFT HAND  Final   Special Requests   Final    BOTTLES DRAWN AEROBIC AND ANAEROBIC Blood Culture results may not be optimal due to an excessive volume of blood received in culture bottles   Culture   Final    NO GROWTH 5 DAYS Performed at [facility], [address], [place], [place] [address]    Report Status 08/12/2018 FINAL  Final  Urine Culture     Status: None   Collection Time: 08/08/18  2:10 AM  Result Value Ref Range Status   Specimen Description   Final    URINE, RANDOM Performed at [facility], [address] [place] [address] [place], [place] [address]    Special Requests   Final    Normal Performed at [facility], [address], [place], [place] [address]    Culture   Final    NO GROWTH Performed at [facility], [place] [address] [place], [place] [address]    Report Status 08/09/2018 FINAL  Final    Coagulation Studies: No results for input(s): LABPROT, INR in the last 72 hours.  Urinalysis: No results for input(s): COLORURINE, LABSPEC, PHURINE, GLUCOSEU, HGBUR,  BILIRUBINUR, KETONESUR, PROTEINUR, UROBILINOGEN, NITRITE, LEUKOCYTESUR in the last 72 hours.  Invalid input(s): APPERANCEUR    Imaging: Dg Chest 2 View  Result Date: 08/12/2018 CLINICAL DATA:  Shortness of breath EXAM: CHEST - 2 VIEW COMPARISON:  08/09/2018 CT, chest x-ray 08/07/2018 FINDINGS: Right Port-A-Cath remains in place, unchanged. Cardiomegaly with vascular congestion. Patchy opacities in the left mid and lower lung could reflect asymmetric edema or infection. Small bilateral effusions. No acute bony abnormality. IMPRESSION: Cardiomegaly with vascular congestion. Left mid and lower lung airspace opacity could reflect asymmetric edema or infection. Small effusions. Electronically Signed   By: Rolm Baptise M.D.   On: 08/12/2018 12:03     Medications:   . sodium chloride     . sodium chloride   Intravenous Once  . atorvastatin  40 mg Oral q1800  . Chlorhexidine Gluconate Cloth  6 each Topical Q0600  . clonazePAM  0.5 mg Oral BID  . DULoxetine  60 mg Oral Daily  . feeding supplement (NEPRO CARB STEADY)  237 mL Oral BID BM  . ferrous sulfate  325 mg Oral Daily  . hydrALAZINE  25 mg Oral TID  . ipratropium-albuterol  3 mL Nebulization Q6H  . isosorbide mononitrate  30 mg Oral Daily  . levothyroxine  25 mcg Oral Q0600  . methylPREDNISolone (SOLU-MEDROL) injection  60 mg Intravenous Q12H  . metoprolol succinate  50 mg Oral Daily  . mometasone-formoterol  2 puff Inhalation BID  . multivitamin  1 tablet Oral QHS  . sodium chloride flush  10 mL Intravenous Q12H  . sodium chloride flush  3 mL Intravenous Q12H   sodium chloride, acetaminophen, albuterol, cyclobenzaprine, fluticasone, hydrALAZINE, labetalol, lidocaine, ondansetron (ZOFRAN) IV, [DISCONTINUED] oxyCODONE-acetaminophen **AND** oxyCODONE, sodium chloride flush, SUMAtriptan  Assessment/ Plan:  Ms. TASMIN EXANTUS is a 63 y.o. white female with history ofovarian cancer stage III,GERD, migraine headaches, cervical spinal  stenosis,who was admitted to [facility] on5/21/2020for evaluation of significant shortness of breath.   1. Acute renal failure on chronic kidney disease stage III with proteinuria baseline creatinine 1.7, GFR of 30 on 07/27/18.   Chronic kidney disease cause seems unclear.  Acute renal failure is thought to be secondary to acute cardiorenal syndrome.  Serologic work up has been negative  First treatment on 5/25.  Evaluate daily for dialysis need. No indication for dialysis today. Creatinine is rising.  2. Hypertension: blood pressure at goal. Not currently on any blood pressure agents. Echo with systolic function slightly decreased, 45-50% EF Holding diuretics.  Metoprolol, hydralazine and isosorbide mononitrate  3. Anemia with acute renal failure: macrocytic.  PRBC transfusion ordered.   4. Acute exacerbation of COPD and acute exacerbation of COPD - nebs, O2  - start systemic steroids.    LOS: 7 Shante [place] 5/28/202010:17 AM   Addendum.  Reassessed patient at 1430. She is having increasing shortness of breath. Will plan on hemodialysis again today and then plan for tunneled catheter placement for tomorroy.   Jacqeline Broers

## 2018-08-13 NOTE — Progress Notes (Addendum)
Patient ID: Jessica Burgess, female   DOB: [birth year], 63 y.o.   MRN: [id number]  pt's wheezing increased. Just got some xanax. sats stable. Give solumederol 60 mg x1  Stat ABG and cxr portable  ABG 7.43/41/45 patient appears somewhat anxious. She is tachycardic and tachypnea. Bilateral wheezing/rhonchi on pulmonary exam.  If worsens will move her to ICU/Stepdown  Patient's chest x-ray shows worsening pulmonary edema. Called Dr. Juleen China and will get dialysis done today with ultrafiltration. Spoke with patient's nurse of the plan  Time spent 20 minutes

## 2018-08-13 NOTE — Plan of Care (Signed)
  Problem: Clinical Measurements: Goal: Ability to maintain clinical measurements within normal limits will improve Outcome: Progressing   Problem: Pain Managment: Goal: General experience of comfort will improve Outcome: Progressing   Problem: Safety: Goal: Ability to remain free from injury will improve Outcome: Progressing   

## 2018-08-13 NOTE — Progress Notes (Addendum)
Patient ID: DEMESHA BOORMAN, female   DOB: 27-Dec-1955, 63 y.o.   MRN: [id number] spoke with patient's family member [name]. I explained her and updated as to what is going on with [name]. She asked to be if patient can be transferred to [facility]. I explained her currently we are capable of doing/helping patient here which [facility] might will do the same. Unless patient has a primary care physician or any other physician who can make arrangements to accept her I will transfer her to [place] else will continue treating her.  Went up to see patient again since she was a bit delayed for hemodialysis. Spoke with Dr. Juleen China that patient is on her way for dialysis. Spoke with Dr. and appreciate his input. Patient needs urgent dialysis for ultrafiltration. She is currently getting one unit of blood transfusion.  Time spent 20 minutes discussing with consultants and family

## 2018-08-13 NOTE — Progress Notes (Signed)
[facility]  [phone] [phone]  ID: Jessica Burgess OB: June 23, 1955  MR#: [id number]  IRW#:[id number]  Patient Care Team: Steele Sizer, MD as PCP - General (Family Medicine) Lloyd Huger, MD as Consulting Physician (Oncology) Mellody Drown, MD as Consulting Physician (Obstetrics and Gynecology) Cathi Roan, [facility] (Pharmacist) Benedetto Goad, RN as Case Manager Clent Jacks, RN as Registered Nurse  CHIEF COMPLAINT: Recurrent ovarian cancer, acute renal failure, anemia.  INTERVAL HISTORY: Patient significantly short of breath upon evaluation.  Patient states he was feeling improved since dialysis several days ago, but now feels worse.  She is highly anxious, but offers no further specific complaints.  REVIEW OF SYSTEMS:   Review of Systems  Constitutional: Negative.  Negative for fever and weight loss.  Respiratory: Positive for cough and shortness of breath. Negative for hemoptysis.   Cardiovascular: Negative.  Negative for chest pain and leg swelling.  Gastrointestinal: Negative.  Negative for abdominal pain.  Genitourinary: Negative.  Negative for dysuria.  Musculoskeletal: Negative.  Negative for back pain.  Skin: Negative.  Negative for rash.  Neurological: Positive for weakness. Negative for dizziness, focal weakness and headaches.  Psychiatric/Behavioral: The patient is nervous/anxious.     As per HPI. Otherwise, a complete review of systems is negative.  PAST MEDICAL HISTORY: Past Medical History:  Diagnosis Date   Allergic rhinitis, cause unspecified    Anxiety state, unspecified    Arthritis    Asthma    only when sick    Backache, unspecified    Bronchitis    hx of when get sick   Cancer [facility])    skin cancer , basal cell    Cancer ([place]) 11/2016   ovarian   Cervicalgia    Complication of anesthesia    Dermatophytosis of nail    Dysmetabolic syndrome X    Encounter for long-term  (current) use of other medications    Esophageal reflux    Insomnia, unspecified    Leukocytosis, unspecified    Migraine without aura, without mention of intractable migraine without mention of status migrainosus    Other and unspecified hyperlipidemia    Other malaise and fatigue    Overweight(278.02)    Personal history of chemotherapy now   ovarian   PONV (postoperative nausea and vomiting)    Spinal stenosis in cervical region    Symptomatic menopausal or female climacteric states    Unspecified disorder of skin and subcutaneous tissue    Unspecified vitamin D deficiency     PAST SURGICAL HISTORY: Past Surgical History:  Procedure Laterality Date   ABDOMINAL HYSTERECTOMY     ANTERIOR CERVICAL DECOMP/DISCECTOMY FUSION N/A 06/07/2015   Procedure: Cervical three - four and Cervical six- seven anterior cervical decompression with fusion interbody prosthesis plating and bonegraft;  Surgeon: Newman Pies, MD;  Location: [facility];  Service: Neurosurgery;  Laterality: N/A;  C34 and C67 anterior cervical decompression with fusion interbody prosthesis plating and bonegraft   BACK SURGERY     x2 Lower    EVACUATION OF CERVICAL HEMATOMA N/A 06/14/2015   Procedure: EVACUATION OF CERVICAL HEMATOMA;  Surgeon: Newman Pies, MD;  Location: [place] [facility];  Service: Neurosurgery;  Laterality: N/A;   NECK SURGERY     x3   TONSILLECTOMY      FAMILY HISTORY: Family History  Problem Relation Age of Onset   Depression Mother    Migraines Mother    Dementia Father    Diabetes Father  Hyperlipidemia Father    Hyperlipidemia Brother    Hyperlipidemia Brother    Breast cancer Paternal Aunt        9s    ADVANCED DIRECTIVES (Y/N):  @ADVDIR @  HEALTH MAINTENANCE: Social History   Tobacco Use   Smoking status: Former Smoker    Packs/day: 1.50    Years: 20.00    Pack years: 30.00    Types: Cigarettes    Start date: 03/19/1979    Last attempt to quit:  08/25/1999    Years since quitting: 18.9   Smokeless tobacco: Never Used   Tobacco comment: smoking cessation materials not required  Substance Use Topics   Alcohol use: Not Currently    Alcohol/week: 0.0 standard drinks   Drug use: No     Colonoscopy:  PAP:  Bone density:  Lipid panel:  No Known Allergies  Current Facility-Administered Medications  Medication Dose Route Frequency Provider Last Rate Last Dose   0.9 %  sodium chloride infusion (Manually program via Guardrails IV Fluids)   Intravenous Once Lavonia Dana, MD   Stopped at 08/13/18 1206   0.9 %  sodium chloride infusion  250 mL Intravenous PRN Seals, Angela H, NP       0.9 %  sodium chloride infusion   Intravenous Continuous Stegmayer, Kimberly A, PA-C       acetaminophen (TYLENOL) tablet 650 mg  650 mg Oral Q4H PRN Seals, Angela H, NP       albuterol (PROVENTIL) (2.5 MG/3ML) 0.083% nebulizer solution 2.5 mg  2.5 mg Nebulization Q2H PRN Fritzi Mandes, MD       atorvastatin (LIPITOR) tablet 40 mg  40 mg Oral q1800 Larey Dresser, MD   40 mg at 08/13/18 1947   ceFAZolin (ANCEF) IVPB 1 g/50 mL premix  1 g Intravenous Once Stegmayer, Kimberly A, PA-C       Chlorhexidine Gluconate Cloth 2 % PADS 6 each  6 each Topical Q0600 Lateef, Munsoor, MD   6 each at 08/13/18 0656   clonazePAM (KLONOPIN) tablet 1 mg  1 mg Oral BID Lavella Hammock, MD       cyclobenzaprine (FLEXERIL) tablet 10 mg  10 mg Oral TID PRN Bradly Bienenstock, NP       diphenhydrAMINE (BENADRYL) injection 50 mg  50 mg Intravenous Once PRN Stegmayer, Kimberly A, PA-C       DULoxetine (CYMBALTA) DR capsule 60 mg  60 mg Oral Daily Flora Lipps, MD   60 mg at 08/13/18 0916   famotidine (PEPCID) tablet 40 mg  40 mg Oral Once PRN Stegmayer, Kimberly A, PA-C       feeding supplement (NEPRO CARB STEADY) liquid 237 mL  237 mL Oral BID BM Fritzi Mandes, MD   237 mL at 08/13/18 1318   ferrous sulfate tablet 325 mg  325 mg Oral Daily Darel Hong D, NP    325 mg at 08/13/18 0916   fluticasone (FLONASE) 50 MCG/ACT nasal spray 2 spray  2 spray Each Nare PRN Bradly Bienenstock, NP   2 spray at 08/12/18 1610   hydrALAZINE (APRESOLINE) injection 10 mg  10 mg Intravenous Q4H PRN Fritzi Mandes, MD   10 mg at 08/12/18 0550   hydrALAZINE (APRESOLINE) tablet 25 mg  25 mg Oral TID Larey Dresser, MD   25 mg at 08/13/18 1948   HYDROmorphone (DILAUDID) injection 1 mg  1 mg Intravenous Once PRN Stegmayer, Kimberly A, PA-C       ipratropium-albuterol (DUONEB) 0.5-2.5 (3)  MG/3ML nebulizer solution 3 mL  3 mL Nebulization Q6H Fritzi Mandes, MD   3 mL at 08/13/18 2018   isosorbide mononitrate (IMDUR) 24 hr tablet 30 mg  30 mg Oral Daily Fritzi Mandes, MD   30 mg at 08/13/18 0915   labetalol (NORMODYNE) injection 20 mg  20 mg Intravenous Q6H PRN Bradly Bienenstock, NP   20 mg at 08/12/18 0440   levothyroxine (SYNTHROID) tablet 25 mcg  25 mcg Oral Q0600 Paticia Stack, [place]   25 mcg at 08/13/18 0656   lidocaine (XYLOCAINE) 2 % viscous mouth solution 15 mL  15 mL Mouth/Throat PRN Bradly Bienenstock, NP   15 mL at 08/07/18 1636   methylPREDNISolone sodium succinate (SOLU-MEDROL) 125 mg/2 mL injection 125 mg  125 mg Intravenous Once PRN Stegmayer, Kimberly A, PA-C       methylPREDNISolone sodium succinate (SOLU-MEDROL) 125 mg/2 mL injection 60 mg  60 mg Intravenous Q12H Kolluru, Sarath, MD   60 mg at 08/13/18 1102   metoprolol succinate (TOPROL-XL) 24 hr tablet 50 mg  50 mg Oral Daily Dunn, Ryan M, PA-C   50 mg at 08/13/18 6269   midazolam (VERSED) 2 MG/ML syrup 8 mg  8 mg Oral Once PRN Stegmayer, Janalyn Harder, PA-C       mometasone-formoterol (DULERA) 200-5 MCG/ACT inhaler 2 puff  2 puff Inhalation BID Darel Hong D, NP   2 puff at 08/13/18 4854   multivitamin (RENA-VIT) tablet 1 tablet  1 tablet Oral QHS Fritzi Mandes, MD       ondansetron [facility]) injection 4 mg  4 mg Intravenous Q6H PRN Stegmayer, Kimberly A, PA-C       oxyCODONE (Oxy IR/ROXICODONE)  immediate release tablet 5 mg  5 mg Oral Q6H PRN Mansy, Jan A, MD   5 mg at 08/12/18 1853   Racepinephrine HCl 2.25 % nebulizer solution 0.5 mL  0.5 mL Nebulization Q4H PRN Fritzi Mandes, MD       sodium chloride flush (NS) 0.9 % injection 10 mL  10 mL Intravenous Q12H Fritzi Mandes, MD   10 mL at 08/13/18 0916   sodium chloride flush (NS) 0.9 % injection 3 mL  3 mL Intravenous Q12H Seals, Levada Dy H, NP   3 mL at 08/11/18 1345   sodium chloride flush (NS) 0.9 % injection 3 mL  3 mL Intravenous PRN Seals, Theo Dills, NP       SUMAtriptan (IMITREX) tablet 100 mg  100 mg Oral TID PRN Fritzi Mandes, MD   100 mg at 08/12/18 1742   Facility-Administered Medications Ordered in Other Encounters  Medication Dose Route Frequency Provider Last Rate Last Dose   0.9 %  sodium chloride infusion   Intravenous Once Lloyd Huger, MD       0.9 %  sodium chloride infusion   Intravenous Once Grayland Ormond, Kathlene November, MD       dexamethasone (DECADRON) 20 mg in sodium chloride 0.9 % 50 mL IVPB  20 mg Intravenous Once Lloyd Huger, MD       dexamethasone (DECADRON) injection 10 mg  10 mg Intravenous Once Lloyd Huger, MD       sodium chloride flush (NS) 0.9 % injection 10 mL  10 mL Intravenous PRN Lloyd Huger, MD   10 mL at 06/16/17 0854    OBJECTIVE: Vitals:   08/13/18 1933 08/13/18 2020  BP: (!) 148/87   Pulse: 81   Resp: 19   Temp: 97.7 F (36.5 C)  SpO2: 90% 95%     Body mass index is 30.89 kg/m.    ECOG FS:3 - Symptomatic, >50% confined to bed  General: Ill-appearing, moderate respiratory distress. Eyes: Pink conjunctiva, anicteric sclera. HEENT: Normocephalic, moist mucous membranes, clear oropharnyx. Lungs: Scattered wheezing throughout. Heart: Regular rate and rhythm. No rubs, murmurs, or gallops. Abdomen: Soft, nontender, nondistended. No organomegaly noted, normoactive bowel sounds. Musculoskeletal: No edema, cyanosis, or clubbing. Neuro: Alert, answering all questions  appropriately. Cranial nerves grossly intact. Skin: No rashes or petechiae noted. Psych: Anxious.   LAB RESULTS:  Lab Results  Component Value Date   NA 136 08/13/2018   K 3.7 08/13/2018   CL 97 (L) 08/13/2018   CO2 28 08/13/2018   GLUCOSE 95 08/13/2018   BUN 53 (H) 08/13/2018   CREATININE 4.02 (H) 08/13/2018   CALCIUM 7.9 (L) 08/13/2018   PROT 6.5 08/06/2018   ALBUMIN 3.0 (L) 08/13/2018   AST 49 (H) 08/06/2018   ALT 22 08/06/2018   ALKPHOS 66 08/06/2018   BILITOT 1.2 08/06/2018   GFRNONAA 11 (L) 08/13/2018   GFRAA 13 (L) 08/13/2018    Lab Results  Component Value Date   WBC 7.5 08/13/2018   NEUTROABS 5.6 08/08/2018   HGB 6.5 (L) 08/13/2018   HCT 21.3 (L) 08/13/2018   MCV 113.9 (H) 08/13/2018   PLT 94 (L) 08/13/2018     STUDIES: Dg Chest 2 View  Result Date: 08/12/2018 CLINICAL DATA:  Shortness of breath EXAM: CHEST - 2 VIEW COMPARISON:  08/09/2018 CT, chest x-ray 08/07/2018 FINDINGS: Right Port-A-Cath remains in place, unchanged. Cardiomegaly with vascular congestion. Patchy opacities in the left mid and lower lung could reflect asymmetric edema or infection. Small bilateral effusions. No acute bony abnormality. IMPRESSION: Cardiomegaly with vascular congestion. Left mid and lower lung airspace opacity could reflect asymmetric edema or infection. Small effusions. Electronically Signed   By: Rolm Baptise M.D.   On: 08/12/2018 12:03   Ct Chest Wo Contrast  Result Date: 08/09/2018 CLINICAL DATA:  Hemoptysis, ovarian cancer EXAM: CT CHEST WITHOUT CONTRAST TECHNIQUE: Multidetector CT imaging of the chest was performed following the standard protocol without IV contrast. COMPARISON:  PET-CT, 05/07/2018, CT chest, 02/02/2018 FINDINGS: Cardiovascular: Cardiomegaly. Trace pericardial effusion. Right chest port catheter. Mediastinum/Nodes: No enlarged mediastinal, hilar, or axillary lymph nodes. Thyroid gland, trachea, and esophagus demonstrate no significant findings.  Lungs/Pleura: There is scattered bilateral ground-glass and clustered nodular pulmonary opacity, most conspicuous in the bilateral upper lobes and new compared to prior PET-CT dated 05/07/2018 (series 3, image 58). There is a new 3 mm subpleural pulmonary nodule of the left pulmonary apex (series 3, image 23) and a new 4 mm subpleural nodule of the right middle lobe (series 3, image 70). There is a thin-walled pneumatocele unchanged from prior examination. Small bilateral pleural effusions and associated atelectasis or consolidation. Upper Abdomen: No acute abnormality. Musculoskeletal: No chest wall mass or suspicious bone lesions identified. IMPRESSION: 1. There is scattered bilateral ground-glass and clustered nodular pulmonary opacity, most conspicuous in the bilateral upper lobes and new compared to prior PET-CT dated 05/07/2018 (series 3, image 58). Small bilateral pleural effusions and associated atelectasis or consolidation. Findings are consistent with atypical infection or inflammation. Drug toxicity is a significant differential consideration in the setting of malignancy. 2. There is a new 3 mm subpleural pulmonary nodule of the left pulmonary apex (series 3, image 23) and a new 4 mm subpleural nodule of the right middle lobe (series 3, image 70). These are nonspecific and  may be infectious or inflammatory although concerning for metastatic disease in the setting of primary ovarian malignancy. Attention on follow-up. Electronically Signed   By: Eddie Candle M.D.   On: 08/09/2018 12:25   US Venous Img Lower Bilateral  Result Date: 08/07/2018 CLINICAL DATA:  Elevated D-dimer. History of ovarian cancer. Evaluate for DVT. EXAM: BILATERAL LOWER EXTREMITY VENOUS DOPPLER ULTRASOUND TECHNIQUE: Gray-scale sonography with graded compression, as well as color Doppler and duplex ultrasound were performed to evaluate the lower extremity deep venous systems from the level of the common femoral vein and including  the common femoral, femoral, profunda femoral, popliteal and calf veins including the posterior tibial, peroneal and gastrocnemius veins when visible. The superficial great saphenous vein was also interrogated. Spectral Doppler was utilized to evaluate flow at rest and with distal augmentation maneuvers in the common femoral, femoral and popliteal veins. COMPARISON:  None. FINDINGS: RIGHT LOWER EXTREMITY Common Femoral Vein: No evidence of thrombus. Normal compressibility, respiratory phasicity and response to augmentation. Saphenofemoral Junction: No evidence of thrombus. Normal compressibility and flow on color Doppler imaging. Profunda Femoral Vein: No evidence of thrombus. Normal compressibility and flow on color Doppler imaging. Femoral Vein: No evidence of thrombus. Normal compressibility, respiratory phasicity and response to augmentation. Popliteal Vein: No evidence of thrombus. Normal compressibility, respiratory phasicity and response to augmentation. Calf Veins: No evidence of thrombus. Normal compressibility and flow on color Doppler imaging. Superficial Great Saphenous Vein: No evidence of thrombus. Normal compressibility. Venous Reflux:  None. Other Findings:  None. LEFT LOWER EXTREMITY Common Femoral Vein: No evidence of thrombus. Normal compressibility, respiratory phasicity and response to augmentation. Saphenofemoral Junction: No evidence of thrombus. Normal compressibility and flow on color Doppler imaging. Profunda Femoral Vein: No evidence of thrombus. Normal compressibility and flow on color Doppler imaging. Femoral Vein: No evidence of thrombus. Normal compressibility, respiratory phasicity and response to augmentation. Popliteal Vein: No evidence of thrombus. Normal compressibility, respiratory phasicity and response to augmentation. Calf Veins: No evidence of thrombus. Normal compressibility and flow on color Doppler imaging. Superficial Great Saphenous Vein: No evidence of thrombus. Normal  compressibility. Venous Reflux:  None. Other Findings: Note made of an approximately 2.3 x 3.6 x 0.8 cm serpiginous fluid collection with the left popliteal fossa. IMPRESSION: 1. No evidence of DVT within either lower extremity. 2. Incidentally noted approximately 3.6 cm left-sided Baker's cyst. Electronically Signed   By: Sandi Mariscal M.D.   On: 08/07/2018 07:49   Dg Chest Port 1 View  Result Date: 08/13/2018 CLINICAL DATA:  Shortness of breath. EXAM: PORTABLE CHEST 1 VIEW COMPARISON:  Radiographs of Aug 12, 2018. FINDINGS: Stable cardiomegaly with central pulmonary vascular congestion. Right internal jugular Port-A-Cath is again noted. No pneumothorax is noted. Increased interstitial densities are noted throughout both lungs most consistent with pulmonary edema. Small bilateral pleural effusions are noted. Bony thorax is unremarkable. IMPRESSION: Stable cardiomegaly with central pulmonary vascular congestion. Increased bilateral interstitial densities are noted consistent with worsening pulmonary edema. Small bilateral pleural effusions are noted. Electronically Signed   By: Marijo Conception M.D.   On: 08/13/2018 13:57   Dg Chest Port 1 View  Result Date: 08/07/2018 CLINICAL DATA:  Acute CHF EXAM: PORTABLE CHEST 1 VIEW COMPARISON:  Yesterday FINDINGS: Cardiomegaly and vascular pedicle widening. Continued perihilar predominant interstitial airspace opacity. No evidence of pleural effusion or pneumothorax. Port on the right with tip at the SVC. IMPRESSION: Continued CHF pattern. Electronically Signed   By: Monte Fantasia M.D.   On: 08/07/2018 06:15  Dg Chest Portable 1 View  Result Date: 08/06/2018 CLINICAL DATA:  Shortness of breath EXAM: PORTABLE CHEST 1 VIEW COMPARISON:  08/06/2018, 06/29/2018, PET-CT 05/07/2018 FINDINGS: Surgical hardware in the cervical spine. Right-sided central venous port tip over the SVC. Cardiomegaly with vascular congestion. Diffuse interstitial opacity, no significant change  as compared with radiograph performed earlier today. No pleural effusion or pneumothorax. IMPRESSION: No significant interval change in cardiomegaly, mild vascular congestion and diffuse interstitial opacity since radiograph performed earlier today. Electronically Signed   By: Donavan Foil M.D.   On: 08/06/2018 23:40   Dg Chest Portable 1 View  Result Date: 08/06/2018 CLINICAL DATA:  63 year old female with respiratory distress, shortness of breath. Recent weight gain. Hypertensive on presentation. Pending COVID-19 status. History of ovarian cancer. EXAM: PORTABLE CHEST 1 VIEW COMPARISON:  06/29/2018 and earlier. FINDINGS: Portable AP semi upright view at 2001 hours. Stable right chest power port. Stable cardiac size and mediastinal contours. Lower lung volumes with ongoing diffuse pulmonary interstitial opacity, basilar predominance. This is similar to but increased compared to April. No superimposed pneumothorax, pleural effusion or consolidation. Prior cervical ACDF. No acute osseous abnormality identified. IMPRESSION: Lower lung volumes with ongoing diffuse pulmonary interstitial opacity, similar to but increased compared to April. Differential considerations include viral/atypical respiratory infection, pulmonary edema, less likely lymphangitic carcinomatosis. Electronically Signed   By: Genevie Ann M.D.   On: 08/06/2018 20:28   Dg Abd Portable 1v  Result Date: 08/10/2018 CLINICAL DATA:  Dialysis catheter insertion. EXAM: PORTABLE ABDOMEN - 1 VIEW COMPARISON:  Abdominal CT 08/06/2018. FINDINGS: Right femoral dialysis catheter extends to the L2-3 disc space level, corresponding with the infrarenal IVC on prior CT. The bowel gas pattern is normal. Patient is status post cholecystectomy and L4-5 fusion. IMPRESSION: Hemodialysis catheter tip at the L2-3 disc space level. Electronically Signed   By: Richardean Sale M.D.   On: 08/10/2018 15:35   Ct Renal Stone Study  Result Date: 08/06/2018 CLINICAL DATA:   History of ovarian cancer. Congestive heart failure. Decreasing GFR. Evaluate for renal obstruction. EXAM: CT ABDOMEN AND PELVIS WITHOUT CONTRAST TECHNIQUE: Multidetector CT imaging of the abdomen and pelvis was performed following the standard protocol without IV contrast. COMPARISON:  05/07/2018 PET-CT. 02/02/2018 CT abdomen and pelvis. FINDINGS: Lower chest: Interlobular septal thickening and hazy central ground-glass opacifications of the lung bases. Small bilateral pleural effusions. Mild cardiomegaly. Hepatobiliary: No focal liver abnormality is seen. Status post cholecystectomy. No biliary dilatation. Pancreas: Unremarkable. No pancreatic ductal dilatation or surrounding inflammatory changes. Spleen: Normal in size without focal abnormality. Adrenals/Urinary Tract: Adrenal glands are unremarkable. Kidneys are normal, without renal calculi, focal lesion, or hydronephrosis. Bladder is unremarkable. Stomach/Bowel: Stomach is within normal limits. Appendix appears normal. No evidence of bowel wall thickening, distention, or inflammatory changes. Vascular/Lymphatic: Aortic atherosclerosis. No enlarged abdominal or pelvic lymph nodes. Reproductive: Status post hysterectomy. No adnexal masses. Other: Supraumbilical hernia containing fat is mildly increased in size. Stable postsurgical changes within the ventral abdominal wall. Nodularity of the left upper quadrant omentum is stable from prior PET-CT (series 2, image 33). Small volume of ascites. Mild edema within the subcutaneous soft tissues. Musculoskeletal: No fracture is seen. L4-5 posterior instrumented fusion and laminectomy chronic postsurgical changes. No apparent hardware related complication. IMPRESSION: 1. No hydronephrosis or urinary stone disease identified. Unremarkable non-contrast CT appearance of the kidneys. 2. Interstitial and alveolar pulmonary edema, small bilateral pleural effusions, small volume of ascites, mild edema within subcutaneous soft  tissues. 3. Stable nodularity of the left upper  quadrant omentum from prior PET-CT. Electronically Signed   By: Kristine Garbe M.D.   On: 08/06/2018 22:31    ASSESSMENT: Recurrent ovarian cancer, acute renal failure, anemia.  PLAN:    1. Stage IIIc high-grade serous ovarian carcinoma: PET scan results from May 07, 2018 reviewed independently with significant improvement in disease burden. Patient CA-125 appears to be erratic ranging from 40s to 60s, although her most recent result is trending up to 75.4.Patient's last treatment was approximately 2 weeks ago.  Will hold further treatment until acute symptoms resolved. 2.  Anemia: Patient's hemoglobin has trended down to 6.5.  She is currently receiving 1 unit of packed red blood cells.  Continue to transfuse and maintain hemoglobin greater than 7.0.  Agree with Procrit during dialysis.   3.  Thrombocytopenia: Decreased, but improving to 94. 4.  Acute renal failure: Thought to be secondary to cardiorenal syndrome.  Patient is now on hemodialysis.  Appreciate nephrology input. 5.  CHF: Patient does not have a history of any heart disease.  Appreciate cardiology input. 6.  Anxiety: Appreciate psychiatry input.  Continue Xanax as ordered.  Will follow.   Patient expressed understanding and was in agreement with this plan. She also understands that She can call clinic at any time with any questions, concerns, or complaints.   Cancer Staging Ovarian cancer, unspecified laterality ([place]) Staging form: Ovary, Fallopian Tube, and Primary Peritoneal Carcinoma, AJCC 8th Edition - Clinical: No stage assigned - Unsigned   Lloyd Huger, MD   08/13/2018 9:10 PM

## 2018-08-13 NOTE — Progress Notes (Signed)
[facility] MD Progress Note  08/13/2018 12:30 PM Jessica Burgess  MRN:  [id number] Principal Problem: ESRD on dialysis [facility]) Diagnosis: Principal Problem:   ESRD on dialysis [facility]) Active Problems:   Insomnia, persistent   Major depression, chronic ([place])   Generalized anxiety disorder   Acute CHF ([place])   Acute CHF (congestive heart failure) ([place])    Patient is seen, chart is reviewed, collateral and communication with nursing regarding patient having increased anxiety in the morning. Total Time spent with patient: 35 min   Subjective:  "I'm having a hard time breathing today and it's making me anxious.  Talking helps get my mind off my breathing."  HPI:  Jessica Burgess is a 63 y.o. female with a known history of ovarian cancer, CKD, hypothyroidism, hypertension and anemia. Patient is currently undergoing chemotherapy followed by Dr. Mellody Drown and Dr. Grayland Ormond. Patient reports having had chemotherapy infusion on yesterday however over the last 3 days she has received infusion of packed red blood cells as well as 2 L IV fluids according to the patient report. She now presents to the emergency room with a 2-day history of increased shortness of breath and nonproductive cough. Shortness of breath is made worse when lying flat. She denies edema. She denies chest pain. She denies abdominal pain. Patient denies fevers, chills, nausea, vomiting, or diarrhea. Patient denies a prior known history of CHF. However she is aware of renal failure occurring since March 2020. CT abdomen was completed which demonstrated no evidence of hydronephrosis or urinary stone disease. Chest x-ray demonstrated no significant interval change in cardiomegaly with mild vascular congestion and diffuse interstitial opacity. No pleural effusion or pneumothorax identified. However, patient developed increased shortness of breath while in the emergency room and was therefore placed on BiPAP therapy to decrease work  of breathing.  Psychiatry consult is requested for evaluation and assistance with management of anxiety.  08/11/2018: On evaluation, patient is calm and cooperative.  She is alert and oriented x4.  Patient describes her mood as stable, with ups and downs related to her cancer diagnosis in 2018.  She expresses that she has developed congestive heart failure and recently renal failure, "secondary to bad judgment.  I should have gotten the blood transfusion, but should not have gotten the extra fluids that worsened my heart failure."  Patient expresses usual level of anxiety while hospitalized.  She states that she has more anxiety at home.  Patient lives with a roommate, and she describes increasing anxiety about 1 hour before when room-mate comes home because her room-mate complains about work not getting done at the house.  Patient reports that she is "happy" in the morning and through the day.  She states she falls asleep easily with Restoril, but it  wears off at 3 AM.  Patient denies SI, HI, AVH.  She denies history of mania or psychosis.  08/12/2018: On evaluation, patient is laying in bed with migraine pain.  She reports that Klonopin that she received this morning was helpful in managing her anxiety, however she feels sedated and dizzy following the 1 mg dose.  She reports that she slept well last night.  Review of records note that she did not require additional Restoril.  Patient is agreeable to low-dose Klonopin twice daily to manage chronic anxiety related to her multiple medical comorbidities.  Patient expresses nausea today with migraine, however reports her appetite is unchanged.  She is denying SI, HI, AVH.  08/13/2018: On evaluation, patient is in  bed, with shortness of breath and extreme anxiety.  She states she is not having a good day.  Patient reports feeling heaviness in her chest, and is awaiting results of a chest x-ray.  Patient denies SI, HI, AVH, however notes that her breathing  and anxiety levels are related and when one worsens the other also worsens.  Sat with patient and provided calming techniques to slow patient's presence and decrease anxiety, which is effective.  Patient is able to tell stories about her growing up, and her relationship with her 2 brothers, mother and father.  Reviewed anxiety medication needs, and agreed to clonazepam 1 mg morning and night.  Will provide patient with 1 dose of Xanax at this time for both shortness of breath and anxiety.  Past Psychiatric History: Depression, anxiety, insomnia   Past Medical History:  Past Medical History:  Diagnosis Date  . Allergic rhinitis, cause unspecified   . Anxiety state, unspecified   . Arthritis   . Asthma    only when sick   . Backache, unspecified   . Bronchitis    hx of when get sick  . Cancer ([place])    skin cancer , basal cell   . Cancer ([place]) 11/2016   ovarian  . Cervicalgia   . Complication of anesthesia   . Dermatophytosis of nail   . Dysmetabolic syndrome X   . Encounter for long-term (current) use of other medications   . Esophageal reflux   . Insomnia, unspecified   . Leukocytosis, unspecified   . Migraine without aura, without mention of intractable migraine without mention of status migrainosus   . Other and unspecified hyperlipidemia   . Other malaise and fatigue   . Overweight(278.02)   . Personal history of chemotherapy now   ovarian  . PONV (postoperative nausea and vomiting)   . Spinal stenosis in cervical region   . Symptomatic menopausal or female climacteric states   . Unspecified disorder of skin and subcutaneous tissue   . Unspecified vitamin D deficiency     Past Surgical History:  Procedure Laterality Date  . ABDOMINAL HYSTERECTOMY    . ANTERIOR CERVICAL DECOMP/DISCECTOMY FUSION N/A 06/07/2015   Procedure: Cervical three - four and Cervical six- seven anterior cervical decompression with fusion interbody prosthesis plating and bonegraft;  Surgeon: Newman Pies, MD;  Location: [place] [facility]URO ORS;  Service: Neurosurgery;  Laterality: N/A;  C34 and C67 anterior cervical decompression with fusion interbody prosthesis plating and bonegraft  . BACK SURGERY     x2 Lower   . EVACUATION OF CERVICAL HEMATOMA N/A 06/14/2015   Procedure: EVACUATION OF CERVICAL HEMATOMA;  Surgeon: Newman Pies, MD;  Location: [place] NEURO ORS;  Service: Neurosurgery;  Laterality: N/A;  . NECK SURGERY     x3  . TONSILLECTOMY     Family History:  Family History  Problem Relation Age of Onset  . Depression Mother   . Migraines Mother   . Dementia Father   . Diabetes Father   . Hyperlipidemia Father   . Hyperlipidemia Brother   . Hyperlipidemia Brother   . Breast cancer Paternal Aunt        81s   Family Psychiatric  History: "a lot on my mother's side - depression" No suicides  Social History:  Social History   Substance and Sexual Activity  Alcohol Use Not Currently  . Alcohol/week: 0.0 standard drinks     Social History   Substance and Sexual Activity  Drug Use No  Social History   Socioeconomic History  . Marital status: Single    Spouse name: Not on file  . Number of children: 0  . Years of education: some college  . Highest education level: 12th grade  Occupational History    Employer: DISABLED  . Occupation: Disabled   Social Needs  . Financial resource strain: Very hard  . Food insecurity:    Worry: Never true    Inability: Never true  . Transportation needs:    Medical: No    Non-medical: No  Tobacco Use  . Smoking status: Former Smoker    Packs/day: 1.50    Years: 20.00    Pack years: 30.00    Types: Cigarettes    Start date: 03/19/1979    Last attempt to quit: 08/25/1999    Years since quitting: 18.9  . Smokeless tobacco: Never Used  . Tobacco comment: smoking cessation materials not required  Substance and Sexual Activity  . Alcohol use: Not Currently    Alcohol/week: 0.0 standard drinks  . Drug use: No  . Sexual activity:  Never  Lifestyle  . Physical activity:    Days per week: 0 days    Minutes per session: 0 min  . Stress: Very much  Relationships  . Social connections:    Talks on phone: Patient refused    Gets together: Patient refused    Attends religious service: Patient refused    Active member of club or organization: Patient refused    Attends meetings of clubs or organizations: Patient refused    Relationship status: Patient refused  Other Topics Concern  . Not on file  Social History Narrative   Patient is single.    Patient lives with roommates.    Patient on disability    Patient has no children.    Patient has some college    Additional Social History:        Living with a room-mate Independent with her care. 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 disability  Substance use: Couple glasses alcohol Every 6-8 months No tobacco No caffeine No marijuana or other illicit drugs                  Sleep: Fair  Appetite:  Fair  Current Medications: Current Facility-Administered Medications  Medication Dose Route Frequency Provider Last Rate Last Dose  . 0.9 %  sodium chloride infusion (Manually program via Guardrails IV Fluids)   Intravenous Once Lavonia Dana, MD   Stopped at 08/13/18 1206  . 0.9 %  sodium chloride infusion  250 mL Intravenous PRN Seals, Levada Dy H, NP      . acetaminophen (TYLENOL) tablet 650 mg  650 mg Oral Q4H PRN Seals, Angela H, NP      . albuterol (PROVENTIL) (2.5 MG/3ML) 0.083% nebulizer solution 2.5 mg  2.5 mg Nebulization Q2H PRN Fritzi Mandes, MD      . ALPRAZolam Duanne Moron) tablet 1 mg  1 mg Oral Once Lavella Hammock, MD      . atorvastatin (LIPITOR) tablet 40 mg  40 mg Oral q1800 Larey Dresser, MD   40 mg at 08/12/18 1742  . Chlorhexidine Gluconate Cloth 2 % PADS 6 each  6 each Topical Q0600 Anthonette Legato, MD   6 each at 08/13/18 0656  . clonazePAM (KLONOPIN) tablet 1 mg  1 mg  Oral BID Lavella Hammock, MD      .  cyclobenzaprine (FLEXERIL) tablet 10 mg  10 mg Oral TID PRN Bradly Bienenstock, NP      . DULoxetine (CYMBALTA) DR capsule 60 mg  60 mg Oral Daily Flora Lipps, MD   60 mg at 08/13/18 0916  . feeding supplement (NEPRO CARB STEADY) liquid 237 mL  237 mL Oral BID BM Fritzi Mandes, MD      . ferrous sulfate tablet 325 mg  325 mg Oral Daily Darel Hong D, NP   325 mg at 08/13/18 0916  . fluticasone (FLONASE) 50 MCG/ACT nasal spray 2 spray  2 spray Each Nare PRN Bradly Bienenstock, NP   2 spray at 08/12/18 [phone]  . hydrALAZINE (APRESOLINE) injection 10 mg  10 mg Intravenous Q4H PRN Fritzi Mandes, MD   10 mg at 08/12/18 0550  . hydrALAZINE (APRESOLINE) tablet 25 mg  25 mg Oral TID Larey Dresser, MD   25 mg at 08/13/18 9485  . ipratropium-albuterol (DUONEB) 0.5-2.5 (3) MG/3ML nebulizer solution 3 mL  3 mL Nebulization Q6H Fritzi Mandes, MD      . isosorbide mononitrate (IMDUR) 24 hr tablet 30 mg  30 mg Oral Daily Fritzi Mandes, MD   30 mg at 08/13/18 0915  . labetalol (NORMODYNE) injection 20 mg  20 mg Intravenous Q6H PRN Darel Hong D, NP   20 mg at 08/12/18 0440  . levothyroxine (SYNTHROID) tablet 25 mcg  25 mcg Oral Q0600 Paticia Stack, [place]   25 mcg at 08/13/18 4627  . lidocaine (XYLOCAINE) 2 % viscous mouth solution 15 mL  15 mL Mouth/Throat PRN Darel Hong D, NP   15 mL at 08/07/18 1636  . methylPREDNISolone sodium succinate (SOLU-MEDROL) 125 mg/2 mL injection 60 mg  60 mg Intravenous Q12H Kolluru, Sarath, MD   60 mg at 08/13/18 1102  . metoprolol succinate (TOPROL-XL) 24 hr tablet 50 mg  50 mg Oral Daily Christell Faith M, PA-C   50 mg at 08/13/18 0350  . mometasone-formoterol (DULERA) 200-5 MCG/ACT inhaler 2 puff  2 puff Inhalation BID Darel Hong D, NP   2 puff at 08/13/18 0938  . multivitamin (RENA-VIT) tablet 1 tablet  1 tablet Oral QHS Fritzi Mandes, MD      . ondansetron [facility]) injection 4 mg  4 mg Intravenous Q6H PRN Seals, Angela H, NP      .  oxyCODONE (Oxy IR/ROXICODONE) immediate release tablet 5 mg  5 mg Oral Q6H PRN Mansy, Jan A, MD   5 mg at 08/12/18 1853  . sodium chloride flush (NS) 0.9 % injection 10 mL  10 mL Intravenous Q12H Fritzi Mandes, MD   10 mL at 08/13/18 0916  . sodium chloride flush (NS) 0.9 % injection 3 mL  3 mL Intravenous Q12H Seals, Angela H, NP   3 mL at 08/11/18 1345  . sodium chloride flush (NS) 0.9 % injection 3 mL  3 mL Intravenous PRN Seals, Theo Dills, NP      . SUMAtriptan (IMITREX) tablet 100 mg  100 mg Oral TID PRN Fritzi Mandes, MD   100 mg at 08/12/18 1742   Facility-Administered Medications Ordered in Other Encounters  Medication Dose Route Frequency Provider Last Rate Last Dose  . 0.9 %  sodium chloride infusion   Intravenous Once Lloyd Huger, MD      . 0.9 %  sodium chloride infusion   Intravenous Once Lloyd Huger, MD      . dexamethasone (DECADRON) 20 mg in sodium chloride 0.9 % 50 mL  IVPB  20 mg Intravenous Once Lloyd Huger, MD      . dexamethasone (DECADRON) injection 10 mg  10 mg Intravenous Once Lloyd Huger, MD      . sodium chloride flush (NS) 0.9 % injection 10 mL  10 mL Intravenous PRN Lloyd Huger, MD   10 mL at 06/16/17 0854    Lab Results:  Results for orders placed or performed during the hospital encounter of 08/06/18 (from the past 48 hour(s))  Renal function panel     Status: Abnormal   Collection Time: 08/12/18  9:04 AM  Result Value Ref Range   Sodium 135 135 - 145 mmol/L   Potassium 4.0 3.5 - 5.1 mmol/L   Chloride 98 98 - 111 mmol/L   CO2 27 22 - 32 mmol/L   Glucose, Bld 168 (H) 70 - 99 mg/dL   BUN 47 (H) 8 - 23 mg/dL   Creatinine, Ser 3.36 (H) 0.44 - 1.00 mg/dL   Calcium 8.3 (L) 8.9 - 10.3 mg/dL   Phosphorus 3.9 2.5 - 4.6 mg/dL   Albumin 3.1 (L) 3.5 - 5.0 g/dL   GFR calc non Af Amer 14 (L) >60 mL/min   GFR calc Af Amer 16 (L) >60 mL/min   Anion gap 10 5 - 15    Comment: Performed at [facility], [address]  [place], [place] [address]  Renal function panel     Status: Abnormal   Collection Time: 08/13/18  5:26 AM  Result Value Ref Range   Sodium 136 135 - 145 mmol/L   Potassium 3.7 3.5 - 5.1 mmol/L   Chloride 97 (L) 98 - 111 mmol/L   CO2 28 22 - 32 mmol/L   Glucose, Bld 95 70 - 99 mg/dL   BUN 53 (H) 8 - 23 mg/dL   Creatinine, Ser 4.02 (H) 0.44 - 1.00 mg/dL   Calcium 7.9 (L) 8.9 - 10.3 mg/dL   Phosphorus 4.4 2.5 - 4.6 mg/dL   Albumin 3.0 (L) 3.5 - 5.0 g/dL   GFR calc non Af Amer 11 (L) >60 mL/min   GFR calc Af Amer 13 (L) >60 mL/min   Anion gap 11 5 - 15    Comment: Performed at [facility], [address] [place], [place] [address]  CBC     Status: Abnormal   Collection Time: 08/13/18  5:26 AM  Result Value Ref Range   WBC 7.5 4.0 - 10.5 K/uL   RBC 1.87 (L) 3.87 - 5.11 MIL/uL   Hemoglobin 6.5 (L) 12.0 - 15.0 g/dL   HCT 21.3 (L) 36.0 - 46.0 %   MCV 113.9 (H) 80.0 - 100.0 fL   MCH 34.8 (H) 26.0 - 34.0 pg   MCHC 30.5 30.0 - 36.0 g/dL   RDW 22.3 (H) 11.5 - 15.5 %   Platelets 94 (L) 150 - 400 K/uL    Comment: Immature Platelet Fraction may be clinically indicated, consider ordering this additional test [id number]    nRBC 0.3 (H) 0.0 - 0.2 %    Comment: Performed at [facility], [address] [place], [place] [address]  Prepare RBC     Status: None   Collection Time: 08/13/18 10:17 AM  Result Value Ref Range   Order Confirmation      ORDER PROCESSED BY BLOOD BANK Performed at [facility], [address] [place], [place] [address]   Type and screen [place]     Status: None (  Preliminary result)   Collection Time: 08/13/18 10:39 AM  Result Value Ref Range   ABO/RH(D) B POS    Antibody Screen NEG    Sample Expiration      08/16/2018,2359 Performed at [facility], [address] [place], [place] [address]    Unit Number [id number]    Blood Component Type RBC, LR IRR    Unit division 00    Status of Unit  ALLOCATED    Transfusion Status OK TO TRANSFUSE    Crossmatch Result Compatible     Blood Alcohol level:  No results found for: [facility]  Metabolic Disorder Labs: Lab Results  Component Value Date   HGBA1C 5.1 07/28/2017   MPG 100 07/28/2017   MPG 108 05/17/2016   No results found for: PROLACTIN Lab Results  Component Value Date   CHOL 131 05/17/2016   TRIG 171 (H) 05/17/2016   HDL 48 (L) 05/17/2016   CHOLHDL 2.7 05/17/2016   VLDL 34 (H) 05/17/2016   LDLCALC 49 05/17/2016   LDLCALC 50 07/31/2015    Physical Findings: AIMS:  , ,  ,  ,    CIWA:    COWS:     Musculoskeletal: Strength & Muscle Tone: within normal limits Gait & Station: unsteady Patient leans: N/A  Psychiatric Specialty Exam: Physical Exam  Nursing note and vitals reviewed. Constitutional: She is oriented to person, place, and time. She appears well-developed and well-nourished. She appears distressed.  HENT:  Head: Normocephalic and atraumatic.  Eyes: EOM are normal.  Neck: Normal range of motion.  Cardiovascular: Normal rate and regular rhythm.  Respiratory: She is in respiratory distress.  Musculoskeletal: Normal range of motion.  Neurological: She is alert and oriented to person, place, and time.    Review of Systems  Constitutional: Positive for malaise/fatigue.  Respiratory: Positive for shortness of breath and wheezing.   Cardiovascular: Negative.   Gastrointestinal: Negative.   Musculoskeletal: Positive for myalgias.  Neurological: Positive for headaches.  Psychiatric/Behavioral: Positive for depression. Negative for hallucinations, memory loss, substance abuse and suicidal ideas. The patient is nervous/anxious and has insomnia.     Blood pressure (!) 178/96, pulse 80, temperature 98 F (36.7 C), temperature source Oral, resp. rate (!) 24, height 5\' 2"  (1.575 m), weight 75.3 kg, SpO2 98 %.Body mass index is 30.38 kg/m.  General Appearance: Casual  Eye Contact:  Good  Speech:  Pressured   Volume:  Decreased  Mood:  Anxious  Affect:  Congruent  Thought Process:  Goal Directed  Orientation:  Full (Time, Place, and Person)  Thought Content:  Logical and Hallucinations: None  Suicidal Thoughts:  No  Homicidal Thoughts:  No  Memory:  Good  Judgement:  Good  Insight:  Good  Psychomotor Activity:  Restlessness  Concentration:  Concentration: Good  Recall:  Good  Fund of Knowledge:  Good  Language:  Good  Akathisia:  No  Handed:  Right  AIMS (if indicated):     Assets:  Communication Skills Desire for Improvement Financial Resources/Insurance Housing Resilience Social Support  ADL's:  Intact  Cognition:  WNL  Sleep:   fair     Treatment Plan Summary: Daily contact with patient to assess and evaluate symptoms and progress in treatment and Medication management Continue Cymbalta 60 mg daily as this appears to be effective in treating patient's depression. Change Klonopin 1 mg twice daily.    Provide Xanax 1 mg PO once for shortness of breath and anxiety  Disposition: No evidence of imminent risk  to self or others at present.   Patient does not meet criteria for psychiatric inpatient admission. Supportive therapy provided about ongoing stressors.   Lavella Hammock, MD 08/13/2018, 12:30 PM

## 2018-08-13 NOTE — Progress Notes (Signed)
Pre HD Assessment    08/13/18 1600  Neurological  Level of Consciousness Alert  Orientation Level Oriented X4  Respiratory  Bilateral Breath Sounds Stridor (Respiratory at bedside, Dr. Juleen China at bedside)  Cough Non-productive  Cardiac  Pulse Regular  Heart Sounds S1, S2  ECG Monitor Yes  Cardiac Rhythm NSR  Vascular  R Radial Pulse +2  L Radial Pulse +2  Integumentary  Integumentary (WDL) X  Skin Color Appropriate for ethnicity  Skin Condition Dry  Skin Integrity Catheter entry/exit site  Musculoskeletal  Musculoskeletal (WDL) X  Generalized Weakness Yes  Gastrointestinal  Bowel Sounds Assessment Active  GU Assessment  Genitourinary (WDL) X  Genitourinary Symptoms Oliguria (HD pt)  Urine Characteristics  Urine Color Yellow/straw  Psychosocial  Psychosocial (WDL) WDL

## 2018-08-14 ENCOUNTER — Other Ambulatory Visit (INDEPENDENT_AMBULATORY_CARE_PROVIDER_SITE_OTHER): Payer: Self-pay | Admitting: Vascular Surgery

## 2018-08-14 DIAGNOSIS — F419 Anxiety disorder, unspecified: Secondary | ICD-10-CM | POA: Diagnosis present

## 2018-08-14 LAB — TYPE AND SCREEN
ABO/RH(D): B POS
Antibody Screen: NEGATIVE
Unit division: 0

## 2018-08-14 LAB — BASIC METABOLIC PANEL
Anion gap: 14 (ref 5–15)
BUN: 67 mg/dL — ABNORMAL HIGH (ref 8–23)
CO2: 22 mmol/L (ref 22–32)
Calcium: 8 mg/dL — ABNORMAL LOW (ref 8.9–10.3)
Chloride: 96 mmol/L — ABNORMAL LOW (ref 98–111)
Creatinine, Ser: 5.02 mg/dL — ABNORMAL HIGH (ref 0.44–1.00)
GFR calc Af Amer: 10 mL/min — ABNORMAL LOW (ref >60)
GFR calc non Af Amer: 9 mL/min — ABNORMAL LOW (ref >60)
Glucose, Bld: 150 mg/dL — ABNORMAL HIGH (ref 70–99)
Potassium: 4 mmol/L (ref 3.5–5.1)
Sodium: 132 mmol/L — ABNORMAL LOW (ref 135–145)

## 2018-08-14 LAB — CBC
HCT: 25.5 % — ABNORMAL LOW (ref 36.0–46.0)
Hemoglobin: 8.5 g/dL — ABNORMAL LOW (ref 12.0–15.0)
MCH: 33.9 pg (ref 26.0–34.0)
MCHC: 33.3 g/dL (ref 30.0–36.0)
MCV: 101.6 fL — ABNORMAL HIGH (ref 80.0–100.0)
Platelets: 124 10*3/uL — ABNORMAL LOW (ref 150–400)
RBC: 2.51 MIL/uL — ABNORMAL LOW (ref 3.87–5.11)
RDW: 23.7 % — ABNORMAL HIGH (ref 11.5–15.5)
WBC: 10.7 10*3/uL — ABNORMAL HIGH (ref 4.0–10.5)
nRBC: 0.9 % — ABNORMAL HIGH (ref 0.0–0.2)

## 2018-08-14 LAB — BPAM RBC
Blood Product Expiration Date: 202006232359
ISSUE DATE / TIME: 202005281350
Unit Type and Rh: 7300

## 2018-08-14 LAB — GLUCOSE, CAPILLARY: Glucose-Capillary: 185 mg/dL — ABNORMAL HIGH (ref 70–99)

## 2018-08-14 MED ORDER — FUROSEMIDE 10 MG/ML IJ SOLN
8.0000 mg/h | INTRAVENOUS | Status: DC
  Administered 2018-08-14: 8 mg/h via INTRAVENOUS
  Filled 2018-08-14: qty 25

## 2018-08-14 MED ORDER — DULOXETINE HCL 30 MG PO CPEP
30.0000 mg | ORAL_CAPSULE | Freq: Every day | ORAL | Status: DC
  Administered 2018-08-15 – 2018-08-18 (×3): 30 mg via ORAL
  Filled 2018-08-14 (×3): qty 1

## 2018-08-14 MED ORDER — METHYLPREDNISOLONE SODIUM SUCC 125 MG IJ SOLR
60.0000 mg | Freq: Every day | INTRAMUSCULAR | Status: DC
  Administered 2018-08-15 – 2018-08-17 (×3): 60 mg via INTRAVENOUS
  Filled 2018-08-14 (×4): qty 2

## 2018-08-14 MED ORDER — LIDOCAINE-EPINEPHRINE (PF) 1 %-1:200000 IJ SOLN
INTRAMUSCULAR | Status: AC
  Filled 2018-08-14: qty 30

## 2018-08-14 MED ORDER — HYDRALAZINE HCL 20 MG/ML IJ SOLN
10.0000 mg | Freq: Four times a day (QID) | INTRAMUSCULAR | Status: DC | PRN
  Administered 2018-08-14 – 2018-08-15 (×2): 20 mg via INTRAVENOUS
  Administered 2018-08-17: 10 mg via INTRAVENOUS
  Filled 2018-08-14 (×3): qty 1

## 2018-08-14 MED ORDER — MORPHINE SULFATE (PF) 2 MG/ML IV SOLN
2.0000 mg | Freq: Once | INTRAVENOUS | Status: AC
  Administered 2018-08-14: 2 mg via INTRAVENOUS
  Filled 2018-08-14: qty 1

## 2018-08-14 MED ORDER — EPOETIN ALFA 10000 UNIT/ML IJ SOLN
10000.0000 [IU] | Freq: Once | INTRAMUSCULAR | Status: AC
  Administered 2018-08-14: 10000 [IU] via INTRAVENOUS
  Filled 2018-08-14: qty 1

## 2018-08-14 MED ORDER — HEPARIN SODIUM (PORCINE) 10000 UNIT/ML IJ SOLN
INTRAMUSCULAR | Status: AC
  Filled 2018-08-14: qty 1

## 2018-08-14 NOTE — Progress Notes (Signed)
Progress Note  Patient Name: Jessica Burgess Date of Encounter: 08/14/2018  Primary Cardiologist: New- Dr. Rockey Situ  Subjective   Still feels significantly SOB and does not feel back at baseline s/p yesterday's HD.   Scheduled for HD today 5/29. Unable to get relief from her inhaler for longer than a minute during today's exam.   Remains very anxious. She indicated she would prefer transfer to [facility] but has agreed to remain at [facility] given likely difficulty with getting a bed at [facility].  C/o significant back pain today with work of breathing. Denies CP.  Inpatient Medications    Scheduled Meds: . sodium chloride   Intravenous Once  . atorvastatin  40 mg Oral q1800  . Chlorhexidine Gluconate Cloth  6 each Topical Q0600  . clonazePAM  1 mg Oral BID  . DULoxetine  60 mg Oral Daily  . feeding supplement (NEPRO CARB STEADY)  237 mL Oral BID BM  . ferrous sulfate  325 mg Oral Daily  . heparin      . hydrALAZINE  25 mg Oral TID  . ipratropium-albuterol  3 mL Nebulization Q6H  . isosorbide mononitrate  30 mg Oral Daily  . levothyroxine  25 mcg Oral Q0600  . lidocaine-EPINEPHrine      . methylPREDNISolone (SOLU-MEDROL) injection  60 mg Intravenous Q12H  . metoprolol succinate  50 mg Oral Daily  . mometasone-formoterol  2 puff Inhalation BID  . multivitamin  1 tablet Oral QHS  . sodium chloride flush  10 mL Intravenous Q12H  . sodium chloride flush  3 mL Intravenous Q12H   Continuous Infusions: . sodium chloride    . sodium chloride    .  ceFAZolin (ANCEF) IV     PRN Meds: sodium chloride, acetaminophen, albuterol, cyclobenzaprine, diphenhydrAMINE, famotidine, fluticasone, hydrALAZINE, HYDROmorphone (DILAUDID) injection, labetalol, lidocaine, methylPREDNISolone (SOLU-MEDROL) injection, midazolam, ondansetron (ZOFRAN) IV, [DISCONTINUED] oxyCODONE-acetaminophen **AND** oxyCODONE, Racepinephrine HCl, sodium chloride flush, SUMAtriptan   Vital Signs    Vitals:   08/14/18 0345  08/14/18 0347 08/14/18 0600 08/14/18 0829  BP: (!) 172/104 (!) 180/115 (!) 159/99 (!) 155/91  Pulse: 82 82 84 89  Resp: 18     Temp: 97.7 F (36.5 C)   97.7 F (36.5 C)  TempSrc:    Oral  SpO2: 91%   95%  Weight:  80.6 kg    Height:        Intake/Output Summary (Last 24 hours) at 08/14/2018 1131 Last data filed at 08/14/2018 1057 Gross per 24 hour  Intake 486.53 ml  Output 2621 ml  Net -2134.47 ml   Filed Weights   08/13/18 1555 08/13/18 1745 08/14/18 0347  Weight: 79 kg 76.6 kg 80.6 kg    Telemetry    NSR to ST up to 114bpm- Personally Reviewed  ECG    No new tracings - Personally Reviewed  Physical Exam   GEN: Anxious, work with breathing, unable to speak more than a few words at a time except for 1 minute after using inhaler. Neck: JVD difficult to assess given accessory muscle use. Cardiac: Distant heart sounds, regular rhythm, tachycardic rate. no murmurs, rubs, or gallops.  Respiratory: Bilateral wheezing, rhonchi, diminished breath sounds.On [place] O2 GI: Soft, nontender, non-distended.  MS: No b/l LE edema; No deformity. Neuro:  Nonfocal  Psych: Anxious   Labs    Chemistry Recent Labs  Lab 08/12/18 0904 08/13/18 0526 08/14/18 0336  NA 135 136 132*  K 4.0 3.7 4.0  CL 98 97* 96*  CO2  27 28 22   GLUCOSE 168* 95 150*  BUN 47* 53* 67*  CREATININE 3.36* 4.02* 5.02*  CALCIUM 8.3* 7.9* 8.0*  ALBUMIN 3.1* 3.0*  --   GFRNONAA 14* 11* 9*  GFRAA 16* 13* 10*  ANIONGAP 10 11 14      Hematology Recent Labs  Lab 08/08/18 0211 08/13/18 0526  WBC 8.8 7.5  RBC 2.20* 1.87*  HGB 7.5* 6.5*  HCT 23.4* 21.3*  MCV 106.4* 113.9*  MCH 34.1* 34.8*  MCHC 32.1 30.5  RDW 23.2* 22.3*  PLT 49* 94*    Cardiac Enzymes Recent Labs  Lab 08/07/18 1507  TROPONINI 0.35*   No results for input(s): TROPIPOC in the last 168 hours.   BNPNo results for input(s): BNP, PROBNP in the last 168 hours.   DDimer No results for input(s): DDIMER in the last 168 hours.    Radiology    Dg Chest Port 1 View  Result Date: 08/13/2018 CLINICAL DATA:  Shortness of breath. EXAM: PORTABLE CHEST 1 VIEW COMPARISON:  Radiographs of Aug 12, 2018. FINDINGS: Stable cardiomegaly with central pulmonary vascular congestion. Right internal jugular Port-A-Cath is again noted. No pneumothorax is noted. Increased interstitial densities are noted throughout both lungs most consistent with pulmonary edema. Small bilateral pleural effusions are noted. Bony thorax is unremarkable. IMPRESSION: Stable cardiomegaly with central pulmonary vascular congestion. Increased bilateral interstitial densities are noted consistent with worsening pulmonary edema. Small bilateral pleural effusions are noted. Electronically Signed   By: Marijo Conception M.D.   On: 08/13/2018 13:57    Cardiac Studies   Echo (08/07/2018): 1. Challenging images 2. The left ventricle has mildly reduced systolic function, with an ejection fraction of 40 to 45%. The cavity size was normal. There is mild to moderately increased left ventricular wall thickness. Concern for anterior/anteroseptal wall hypokinesis on  select images, though difficult to visualize. Left ventricular diastolic doppler parameters are consistent with impaired relaxation. 3. The right ventricle has normal systolic function. The cavity was normal. There is no increase in right ventricular wall thickness.Unable to estimate RVSP 4. Left atrial size was mild-moderately dilated. 5. Aortic valve regurgitation is mild to moderate by color flow Doppler. 6. Trivial pericardial effusion is present.  Patient Profile     63 y.o. female with h/o ovarian CA and gemcitabine with associated anemia and thrombocytopenia, CKD, HTN, asthma and migraine disorder, and who is being followed for acute on chronic HFrEF and AOCKD.  Assessment & Plan    Acute respiratory failure with hypoxia - Remains SOB likely due to volume overload and pulmonary edema. Back pain due to  work with breathing. - S/p HD yesterday 5/28. Case discussed with nephrology with plan for repeat HD today 5/29 and continued volume management per nephrology.  Acute on chronic systolic heart failure (EF 45%) - As above, repeat HD today 5/29 to assist with volume management given renal insufficiency - Echo as above with EF 40-45%. - Continue hydralazine, isosorbide, Toprol XL.  - No ACE/ARB d/t renal function / per nephrology. - Monitor I/O, daily weights. - Daily BMET. Cr 5.02, BUN 67. K 4.0. - No plans for ischemic workup this admission as directly below.  - Volume management per nephrology.   Elevated troponin - No current chest pain. Troponin elevation 5/22 and thought likely 2/2 supply demand ischemia in the setting of comorbid conditions above.  - No plans for ischemic workup this admission as not ideal candidate given anemia, thrombocytopenia.   HTN - BP suboptimally controlled and 155/91 with  HR 90 bpm. - Continue hydralazine, isosorbide, PRN labetalol, Toprol with titration as needed.  AOCKD - Concern for cardiorenal syndrome.  - Management per nephrology. HD today 5/29.  Anemia - PRBC as needed per IM.  - Daily CBC.   For questions or updates, please contact [place] Please consult [email or web address] for contact info under        Signed, Arvil Chaco, PA-C  08/14/2018, 11:31 AM

## 2018-08-14 NOTE — Progress Notes (Signed)
RN reported to RT patient was taken off bipap. Patient currently on 3L [place] at 96%SPO2 resting comfortably in bed. Will continue to monitor.

## 2018-08-14 NOTE — Progress Notes (Signed)
[facility] [place] NAME: Jessica Burgess    MR#:  [id number]  DATE OF BIRTH:  [birth year]  SUBJECTIVE:   Complains of shortness of breath and wheezing. She is a bit anxious.  Continues with workup breathing. Response was called. Patient is being transferred to ICU. REVIEW OF SYSTEMS:   Review of Systems  Constitutional: Negative for chills, fever and weight loss.  HENT: Negative for ear discharge, ear pain and nosebleeds.   Eyes: Negative for blurred vision, pain and discharge.  Respiratory: Positive for shortness of breath. Negative for sputum production, wheezing and stridor.   Cardiovascular: Negative for chest pain, palpitations, orthopnea and PND.  Gastrointestinal: Negative for abdominal pain, diarrhea, nausea and vomiting.  Genitourinary: Negative for frequency and urgency.  Musculoskeletal: Negative for back pain and joint pain.  Neurological: Positive for weakness. Negative for sensory change, speech change and focal weakness.  Psychiatric/Behavioral: Negative for depression and hallucinations. The patient is not nervous/anxious.    Tolerating Diet:yes Tolerating PT: ambulatory  DRUG ALLERGIES:  No Known Allergies  VITALS:  Blood pressure (!) 178/94, pulse 95, temperature (!) 97 F (36.1 C), temperature source Oral, resp. rate (!) 28, height 5\' 2"  (1.575 m), weight 80.6 kg, SpO2 99 %.  PHYSICAL EXAMINATION:   Physical Exam  GENERAL:  63 y.o.-year-old patient lying in the bed with no acute distress.  EYES: Pupils equal, round, reactive to light and accommodation. No scleral icterus. Extraocular muscles intact.  HEENT: Head atraumatic, normocephalic. Oropharynx and nasopharynx clear. Puffy face NECK:  Supple, no jugular venous distention. No thyroid enlargement, no tenderness.  LUNGS: decreased breath sounds bilaterally, +wheezing, few rales,no rhonchi. No use of accessory muscles of respiration. Now on  BiPAP. CARDIOVASCULAR: S1, S2 normal. No murmurs, rubs, or gallops.  ABDOMEN: Soft, nontender, nondistended. Bowel sounds present. No organomegaly or mass.  EXTREMITIES: No cyanosis, clubbing or edema b/l.    NEUROLOGIC: Cranial nerves II through XII are intact. No focal Motor or sensory deficits b/l.   PSYCHIATRIC:  patient is alert and oriented x 3.  SKIN: No obvious rash, lesion, or ulcer.   LABORATORY PANEL:  CBC Recent Labs  Lab 08/14/18 1543  WBC 10.7*  HGB 8.5*  HCT 25.5*  PLT 124*    Chemistries  Recent Labs  Lab 08/14/18 0336  NA 132*  K 4.0  CL 96*  CO2 22  GLUCOSE 150*  BUN 67*  CREATININE 5.02*  CALCIUM 8.0*   Cardiac Enzymes No results for input(s): TROPONINI in the last 168 hours. RADIOLOGY:  Dg Chest Port 1 View  Result Date: 08/13/2018 CLINICAL DATA:  Shortness of breath. EXAM: PORTABLE CHEST 1 VIEW COMPARISON:  Radiographs of Aug 12, 2018. FINDINGS: Stable cardiomegaly with central pulmonary vascular congestion. Right internal jugular Port-A-Cath is again noted. No pneumothorax is noted. Increased interstitial densities are noted throughout both lungs most consistent with pulmonary edema. Small bilateral pleural effusions are noted. Bony thorax is unremarkable. IMPRESSION: Stable cardiomegaly with central pulmonary vascular congestion. Increased bilateral interstitial densities are noted consistent with worsening pulmonary edema. Small bilateral pleural effusions are noted. Electronically Signed   By: Jessica Burgess M.D.   On: 08/13/2018 13:57   ASSESSMENT AND PLAN:   Jessica Burgess  is a 63 y.o. female with a known history of ovarian cancer, CKD, hypothyroidism, hypertension and anemia.  Patient is currently undergoing chemotherapy followed by Dr. Mellody Drown and Dr. Grayland Ormond. Presented to the emergency room with a 2-day history  of increased shortness of breath and nonproductive cough.  Shortness of breath is made worse when lying flat.    1.  Acute   Systolic CHF with episodes of flash pulmonary edema - Patient started on Lasix 40 mg IV twice daily-- on daily lasix IV 40 mg-- Lasix held given elevated creatinine--HD started on 08/10/2018 x2 sessions--HD resumed since yesterday due to worsening pulmonary edema. -Transfer to ICU -BiPAP -emergent dialysis today -patient started on IV Lasix drip - echocardiogram  left ventricle has mildly reduced systolic function, with an ejection fraction of 45-50%. The cavity size was normal. There is mildly increased left ventricular wall thickness. Concern for anterior/anteroseptal wall hypokinesis -sats 99-100% 2 liter - appreciate cardiology input from Drs gollan/Mclean/Arida/End -added hydralazine and imdur per cardiology recommendation for after load reduction -HD started for ? cardiorenal syndrome on 08/10/2018--holding for now  2.  Acute on chronic renal failure-III -?cardiorenal syndrome -creat baseline 1.7 -creat 3.20--3.19--3.4--3.76--4.19--HD started (08/11/2018) --3.4--4.10--5.1 f/urenal labs ANCA,GBM,ENA,DNA/DS, AntiCH,centro, [name] -?renal bx  3.  Hyperkalemia-- resolved - Potassium level is 5.5-- down to 4.0  4.  High-grade ovarian cancer - Patient is status post chemotherapy with gemcitabine - seen by  Dr. Grayland Ormond   5.  Lactic acidosis- 3.4 lactic acid level--3.1--2.0 - Will repeat lactic acid level as patient is diuresed.  No other evidence of infectious process.  Blood, urine, and sputum cultures are pending.  Will treat as indicated  6.  Hypothyroidism - Synthroid continued  7. Anemia of chronic dz -recently received 2 units BT at the cancer center -hgb 6.5-- one unit PR BC -- hemoglobin 8.5  8. TCP suspected due to recent chemo stable at 124K  9. Anxiety spells -patient has history of previous anxiety and depression in the past. Psych consult placed with Dr. Reeves Dam in put cont cymbalta, klonopin  10. Acute on chronic COPD exacerbation with significant  wheezing-- so due to/pulmonary edema -IV Solu-Medrol 60 daily, nebs, oxygen, inhalers  Dr. Juleen China has already spoken with patient's sister Jessica Burgess CODE STATUS: full  DVT Prophylaxis: heparin  TOTAL critical  TIME TAKING CARE OF THIS PATIENT: *40* minutes.  >50% time spent on counselling and coordination of care  POSSIBLE D/C IN *?* DAYS, DEPENDING ON CLINICAL CONDITION.  Note: This dictation was prepared with Dragon dictation along with smaller phrase technology. Any transcriptional errors that result from this process are unintentional.  Fritzi Mandes M.D on 08/14/2018 at 4:21 PM  Between 7am to 6pm - Pager - [phone]  After 6pm go to [email or web address] - password [email or web address] [facility]  Office  [phone]  CC: Primary care physician; Steele Sizer, MDPatient ID: Jessica Burgess, female   DOB: 02/06/1956, 63 y.o.   MRN: [id number]

## 2018-08-14 NOTE — Progress Notes (Signed)
Pre HD Tx    08/14/18 1530  Neurological  Level of Consciousness Alert  Orientation Level Oriented X4  Respiratory  Respiratory Pattern Regular;Labored  Chest Assessment Chest expansion symmetrical  Bilateral Breath Sounds Coarse crackles  Cardiac  Pulse Regular  Cardiac Rhythm NSR  Vascular  R Radial Pulse +2  L Radial Pulse +2  Edema Right lower extremity;Left lower extremity  RLE Edema Non-pitting  LLE Edema Non-Pitting  Integumentary  Integumentary (WDL) X  Skin Color Appropriate for ethnicity  Skin Condition Diaphoretic  Skin Integrity Catheter entry/exit site  Additional Integumentary Comments  (HD pt)  Musculoskeletal  Musculoskeletal (WDL) X  Generalized Weakness Yes  Gastrointestinal  Bowel Sounds Assessment Active  Psychosocial  Psychosocial (WDL) WDL

## 2018-08-14 NOTE — Progress Notes (Signed)
HD Tx Start   08/14/18 1645  Vital Signs  Pulse Rate 85  Resp (!) 22  BP (!) 159/96  Oxygen Therapy  SpO2 99 %  O2 Device Bi-PAP  During Hemodialysis Assessment  Blood Flow Rate (mL/min) 400 mL/min  Arterial Pressure (mmHg) -140 mmHg  Venous Pressure (mmHg) 260 mmHg  Transmembrane Pressure (mmHg) 60 mmHg  Ultrafiltration Rate (mL/min) 1010 mL/min  Dialysate Flow Rate (mL/min) 600 ml/min  Conductivity: Machine  14  HD Safety Checks Performed Yes  Dialysis Fluid Bolus Normal Saline  Bolus Amount (mL) 250 mL  Intra-Hemodialysis Comments Tx initiated  Hemodialysis Catheter Right Femoral vein Double-lumen  Placement Date/Time: 08/10/18 1514   Placed prior to admission: No  Time Out: Correct patient;Correct procedure;Correct site  Maximum sterile barrier precautions: Hand hygiene;Cap;Mask;Sterile gown;Sterile gloves;Large sterile sheet  Site Prep: Chlorh...  Site Condition No complications  Blue Lumen Status Infusing  Red Lumen Status Infusing

## 2018-08-14 NOTE — Progress Notes (Signed)
Patient's sister [name]) called and updated on pt's condition and movement to ICU room 12. Sister understood and said she would call later for update.

## 2018-08-14 NOTE — Significant Event (Signed)
Rapid Response Event Note  Overview: Time Called: 0045 Arrival Time: 9977 Event Type: Respiratory  Initial Focused Assessment: Patient dyspneic, abdominal breathing, complaining of not being able to catch her breath. New dialysis patient scheduled for HD this afternoon.  Interventions: Patient transferred to ICU for bi-pap and emergent HD.  Plan of Care (if not transferred):  Event Summary: Name of Physician Notified: Fritzi Mandes, MD/Aleskerov, MD at 1250    at    Outcome: Transferred (Comment)(to CCU 12)  Event End Time: [place]

## 2018-08-14 NOTE — Progress Notes (Signed)
[facility]  Communication Note Patient transferred to the ICU for a decline in her respiratory status today.  Will cancel permcath insertion for today. Patient will continue to use temporary dialysis catheter at this time. Will plan on permcath insertion this Monday with Dr. Lucky Cowboy Will pre-op.  Discussed with Dr. Ellis Parents Jessica Khamis PA-C 08/14/2018 1:26 PM

## 2018-08-14 NOTE — Progress Notes (Signed)
Post HD Tx Assessment   08/14/18 1925  Neurological  Level of Consciousness Alert  Orientation Level Oriented X4  Respiratory  Respiratory Pattern Regular;Labored  Chest Assessment Chest expansion symmetrical  Bilateral Breath Sounds Coarse crackles  Cardiac  Pulse Regular  Cardiac Rhythm NSR  Vascular  R Radial Pulse +2  L Radial Pulse +2  Edema Right lower extremity;Left lower extremity  RLE Edema Non-pitting  LLE Edema Non-Pitting  Integumentary  Integumentary (WDL) X  Skin Color Appropriate for ethnicity  Skin Condition Diaphoretic  Skin Integrity Catheter entry/exit site  Musculoskeletal  Musculoskeletal (WDL) X  Generalized Weakness Yes  Gastrointestinal  Bowel Sounds Assessment Active  Psychosocial  Psychosocial (WDL) WDL

## 2018-08-14 NOTE — Progress Notes (Signed)
Request was made to start outpatient placement for Hemodialysis. Patient decided on [facility] to have treatments at. Patient and family/friend were educated. No additional records required to start placement referral.

## 2018-08-14 NOTE — Progress Notes (Signed)
RT notified by RN to assess patient as she was not tolerating bipap.  Patient complaining of discomfort from mask/machine.  Attempted to readjust straps and settings with no relief.  Patient removed mask and refused to place it back on.  Placed on 4lpm [place] with stable SpO2.  Increased work of breathing with audible rales present.  Will continue to monitor.

## 2018-08-14 NOTE — Plan of Care (Signed)
Acute respiratory distress has caused impaired mobility as well as high anxiety. Chaplain has spoken with patient and will return later. Family has been updated by NP per patient request. Patient seems to have improved anxiousness.

## 2018-08-14 NOTE — Progress Notes (Signed)
Patient called out for breathing treatment (complained of SOB). On assessment patient had audible crackles and work of breathing increased. RR called. Patient transferred to ICU 12 for bipap. Report given to [employer]. Lupita Leash

## 2018-08-14 NOTE — Progress Notes (Signed)
Pre HD Tx    08/14/18 1640  Hand-Off documentation  Report given to (Full Name) Trellis Paganini RN  Report received from (Full Name) Freddi Starr RN  Vital Signs  Temp 97.6 F (36.4 C)  Temp Source Axillary  Pulse Rate 85  Pulse Rate Source Monitor  Resp 19  BP (!) 143/89  BP Location Right Arm  BP Method Automatic  Patient Position (if appropriate) Lying  Oxygen Therapy  SpO2 99 %  O2 Device Bi-PAP  Pain Assessment  Pain Scale 0-10  Pain Score 0  Dialysis Weight  Weight 80 kg  Type of Weight Pre-Dialysis  Time-Out for Hemodialysis  What Procedure? Hemodialysis  Pt Identifiers(min of two) First/Last Name;MRN/Account#  Correct Site? Yes  Correct Side? Yes  Correct Procedure? Yes  Consents Verified? Yes  Rad Studies Available? N/A  Safety Precautions Reviewed? Yes  Engineer, civil (consulting) Number 5  Station Number  (ICU 12)  UF/Alarm Test Passed  Conductivity: Meter 14  Conductivity: Machine  14  pH 7.2  Reverse Osmosis Main  Normal Saline Lot Number [id number]  Dialyzer Lot Number [id number]  Disposable Set Lot Number [id number]  Machine Temperature 98.6 F (37 C)  Musician and Audible Yes  Blood Lines Intact and Secured Yes  Pre Treatment Patient Checks  Vascular access used during treatment Catheter  Hepatitis B Surface Antigen Results Negative  Date Hepatitis B Surface Antigen Drawn 08/10/18  Hepatitis B Surface Antibody  (>10)  Date Hepatitis B Surface Antibody Drawn 08/10/18  Hemodialysis Consent Verified Yes  Hemodialysis Standing Orders Initiated Yes  ECG (Telemetry) Monitor On Yes  Prime Ordered Normal Saline  Length of  DialysisTreatment -hour(s) 3 Hour(s)  Dialysis Treatment Comments Na 140  Dialyzer Elisio 17H NR  Dialysate 3K, 2.5 Ca  Dialysate Flow Ordered 600  Blood Flow Rate Ordered 400 mL/min  Ultrafiltration Goal 3 Liters  Dialysis Blood Pressure Support Ordered Normal Saline  Education / Care Plan  Dialysis Education Provided  Yes  Documented Education in Care Plan Yes  Hemodialysis Catheter Right Femoral vein Double-lumen  Placement Date/Time: 08/10/18 1514   Placed prior to admission: No  Time Out: Correct patient;Correct procedure;Correct site  Maximum sterile barrier precautions: Hand hygiene;Cap;Mask;Sterile gown;Sterile gloves;Large sterile sheet  Site Prep: Chlorh...  Site Condition No complications  Blue Lumen Status Capped (Central line)  Red Lumen Status Capped (Central line)  Dressing Type Biopatch;Occlusive  Dressing Status Clean;Dry;Intact  Drainage Description None

## 2018-08-14 NOTE — Progress Notes (Signed)
[facility]  ROUNDING NOTE   Subjective:   Hemodialysis treatment yesterday. Tolerated treatment well. UF of 2.4 liters.   Spoke to patient's family member, Rosmary, on the phone.   Objective:  Vital signs in last 24 hours:  Temp:  [97.6 F (36.4 C)-98 F (36.7 C)] 97.7 F (36.5 C) (05/29 0829) Pulse Rate:  [81-100] 89 (05/29 0829) Resp:  [16-30] 18 (05/29 0345) BP: (148-217)/(87-130) 155/91 (05/29 0829) SpO2:  [90 %-100 %] 95 % (05/29 0829) Weight:  [76.6 kg-80.6 kg] 80.6 kg (05/29 0347)  Weight change: 3.657 kg Filed Weights   08/13/18 1555 08/13/18 1745 08/14/18 0347  Weight: 79 kg 76.6 kg 80.6 kg    Intake/Output: I/O last 3 completed shifts: In: 486.5 [I.V.:100; Blood:386.5] Out: 2821 [Urine:400; Other:2421]   Intake/Output this shift:  Total I/O In: -  Out: 400 [Urine:400]  Physical Exam: General: NAD,   Head: Normocephalic, atraumatic. Moist oral mucosal membranes  Eyes: Anicteric, PERRL  Neck: Supple, trachea midline  Lungs:  Clear to auscultation, 3 L [place] O2  Heart: Regular rate and rhythm  Abdomen:  Soft, nontender,   Extremities:  no peripheral edema.  Neurologic: Nonfocal, moving all four extremities  Skin: No lesions  Access: Right femoral temp cath 5/25 Dr. Trula Slade    Basic Metabolic Panel: Recent Labs  Lab 08/10/18 0539 08/10/18 1640 08/11/18 0407 08/12/18 0904 08/13/18 0526 08/14/18 0336  NA 134*  --  137 135 136 132*  K 4.4  --  4.4 4.0 3.7 4.0  CL 103  --  102 98 97* 96*  CO2 22  --  23 27 28 22   GLUCOSE 123*  --  96 168* 95 150*  BUN 90*  --  72* 47* 53* 67*  CREATININE 4.13*  --  3.46* 3.36* 4.02* 5.02*  CALCIUM 7.9*  --  8.1* 8.3* 7.9* 8.0*  PHOS  --  5.5*  --  3.9 4.4  --     Liver Function Tests: Recent Labs  Lab 08/12/18 0904 08/13/18 0526  ALBUMIN 3.1* 3.0*   No results for input(s): LIPASE, AMYLASE in the last 168 hours. No results for input(s): AMMONIA in the last 168 hours.  CBC: Recent Labs   Lab 08/08/18 0211 08/13/18 0526  WBC 8.8 7.5  NEUTROABS 5.6  --   HGB 7.5* 6.5*  HCT 23.4* 21.3*  MCV 106.4* 113.9*  PLT 49* 94*    Cardiac Enzymes: Recent Labs  Lab 08/07/18 1507  TROPONINI 0.35*    BNP: Invalid input(s): POCBNP  CBG: No results for input(s): GLUCAP in the last 168 hours.  Microbiology: Results for orders placed or performed during the hospital encounter of 08/06/18  SARS Coronavirus 2 (CEPHEID - Performed in [place] hospital lab), Hosp Order     Status: None   Collection Time: 08/06/18  8:15 PM  Result Value Ref Range Status   SARS Coronavirus 2 NEGATIVE NEGATIVE Final    Comment: (NOTE) If result is NEGATIVE SARS-CoV-2 target nucleic acids are NOT DETECTED. The SARS-CoV-2 RNA is generally detectable in upper and lower  respiratory specimens during the acute phase of infection. The lowest  concentration of SARS-CoV-2 viral copies this assay can detect is 250  copies / mL. A negative result does not preclude SARS-CoV-2 infection  and should not be used as the sole basis for treatment or other  patient management decisions.  A negative result may occur with  improper specimen collection / handling, submission of specimen other  than  nasopharyngeal swab, presence of viral mutation(s) within the  areas targeted by this assay, and inadequate number of viral copies  (<250 copies / mL). A negative result must be combined with clinical  observations, patient history, and epidemiological information. If result is POSITIVE SARS-CoV-2 target nucleic acids are DETECTED. The SARS-CoV-2 RNA is generally detectable in upper and lower  respiratory specimens dur ing the acute phase of infection.  Positive  results are indicative of active infection with SARS-CoV-2.  Clinical  correlation with patient history and other diagnostic information is  necessary to determine patient infection status.  Positive results do  not rule out bacterial infection or  co-infection with other viruses. If result is PRESUMPTIVE POSTIVE SARS-CoV-2 nucleic acids MAY BE PRESENT.   A presumptive positive result was obtained on the submitted specimen  and confirmed on repeat testing.  While 2019 novel coronavirus  (SARS-CoV-2) nucleic acids may be present in the submitted sample  additional confirmatory testing may be necessary for epidemiological  and / or clinical management purposes  to differentiate between  SARS-CoV-2 and other Sarbecovirus currently known to infect humans.  If clinically indicated additional testing with an alternate test  methodology [phone]) is advised. The SARS-CoV-2 RNA is generally  detectable in upper and lower respiratory sp ecimens during the acute  phase of infection. The expected result is Negative. Fact Sheet for Patients:  StrictlyIdeas.no Fact Sheet for Healthcare Providers: BankingDealers.co.za This test is not yet approved or cleared by the [place] FDA and has been authorized for detection and/or diagnosis of SARS-CoV-2 by FDA under an Emergency Use Authorization (EUA).  This EUA will remain in effect (meaning this test can be used) for the duration of the COVID-19 declaration under Section 564(b)(1) of the Act, 21 U.S.C. section 360bbb-3(b)(1), unless the authorization is terminated or revoked sooner. Performed at [facility], [address] [place], [place] [address]   MRSA PCR Screening     Status: None   Collection Time: 08/07/18  2:38 AM  Result Value Ref Range Status   MRSA by PCR NEGATIVE NEGATIVE Final    Comment:        The GeneXpert MRSA Assay (FDA approved for NASAL specimens only), is one component of a comprehensive MRSA colonization surveillance program. It is not intended to diagnose MRSA infection nor to guide or monitor treatment for MRSA infections. Performed at [facility], [address] [place], [place]  [address]   CULTURE, BLOOD (ROUTINE X 2) w Reflex to ID Panel     Status: None   Collection Time: 08/07/18  3:40 AM  Result Value Ref Range Status   Specimen Description BLOOD LEFT ANTECUBITAL  Final   Special Requests   Final    BOTTLES DRAWN AEROBIC AND ANAEROBIC Blood Culture results may not be optimal due to an excessive volume of blood received in culture bottles   Culture   Final    NO GROWTH 5 DAYS Performed at [facility], [address] [place], [place] [address]    Report Status 08/12/2018 FINAL  Final  CULTURE, BLOOD (ROUTINE X 2) w Reflex to ID Panel     Status: None   Collection Time: 08/07/18  3:40 AM  Result Value Ref Range Status   Specimen Description BLOOD BLOOD LEFT HAND  Final   Special Requests   Final    BOTTLES DRAWN AEROBIC AND ANAEROBIC Blood Culture results may not be optimal due to an excessive volume of blood received in culture  bottles   Culture   Final    NO GROWTH 5 DAYS Performed at [facility], [address] [place], [place] [address]    Report Status 08/12/2018 FINAL  Final  Urine Culture     Status: None   Collection Time: 08/08/18  2:10 AM  Result Value Ref Range Status   Specimen Description   Final    URINE, RANDOM Performed at [facility], [address], [place], [place] [address]    Special Requests   Final    Normal Performed at [facility], [address], [place], [place] [address]    Culture   Final    NO GROWTH Performed at [facility], [place] [address], [place], [place] [address]    Report Status 08/09/2018 FINAL  Final    Coagulation Studies: No results for input(s): LABPROT, INR in the last 72 hours.  Urinalysis: No results for input(s): COLORURINE, LABSPEC, PHURINE, GLUCOSEU, HGBUR, BILIRUBINUR, KETONESUR, PROTEINUR, UROBILINOGEN, NITRITE, LEUKOCYTESUR in the last 72 hours.  Invalid input(s): APPERANCEUR    Imaging: Dg Chest Port 1 View  Result Date:  08/13/2018 CLINICAL DATA:  Shortness of breath. EXAM: PORTABLE CHEST 1 VIEW COMPARISON:  Radiographs of Aug 12, 2018. FINDINGS: Stable cardiomegaly with central pulmonary vascular congestion. Right internal jugular Port-A-Cath is again noted. No pneumothorax is noted. Increased interstitial densities are noted throughout both lungs most consistent with pulmonary edema. Small bilateral pleural effusions are noted. Bony thorax is unremarkable. IMPRESSION: Stable cardiomegaly with central pulmonary vascular congestion. Increased bilateral interstitial densities are noted consistent with worsening pulmonary edema. Small bilateral pleural effusions are noted. Electronically Signed   By: Marijo Conception M.D.   On: 08/13/2018 13:57     Medications:   . sodium chloride    . sodium chloride    .  ceFAZolin (ANCEF) IV     . sodium chloride   Intravenous Once  . atorvastatin  40 mg Oral q1800  . Chlorhexidine Gluconate Cloth  6 each Topical Q0600  . clonazePAM  1 mg Oral BID  . DULoxetine  60 mg Oral Daily  . feeding supplement (NEPRO CARB STEADY)  237 mL Oral BID BM  . ferrous sulfate  325 mg Oral Daily  . heparin      . hydrALAZINE  25 mg Oral TID  . ipratropium-albuterol  3 mL Nebulization Q6H  . isosorbide mononitrate  30 mg Oral Daily  . levothyroxine  25 mcg Oral Q0600  . lidocaine-EPINEPHrine      . methylPREDNISolone (SOLU-MEDROL) injection  60 mg Intravenous Q12H  . metoprolol succinate  50 mg Oral Daily  . mometasone-formoterol  2 puff Inhalation BID  . multivitamin  1 tablet Oral QHS  . sodium chloride flush  10 mL Intravenous Q12H  . sodium chloride flush  3 mL Intravenous Q12H   sodium chloride, acetaminophen, albuterol, cyclobenzaprine, diphenhydrAMINE, famotidine, fluticasone, hydrALAZINE, HYDROmorphone (DILAUDID) injection, labetalol, lidocaine, methylPREDNISolone (SOLU-MEDROL) injection, midazolam, ondansetron (ZOFRAN) IV, [DISCONTINUED] oxyCODONE-acetaminophen **AND** oxyCODONE,  Racepinephrine HCl, sodium chloride flush, SUMAtriptan  Assessment/ Plan:  Ms. Jessica Burgess is a 63 y.o. white female with history ofovarian cancer stage III,GERD, migraine headaches, cervical spinal stenosis,who was admitted to [facility] on5/21/2020for evaluation of significant shortness of breath.   1. Acute renal failure on chronic kidney disease stage III with proteinuria baseline creatinine 1.7, GFR of 30 on 07/27/18.   Chronic kidney disease cause seems unclear.  Acute renal failure is thought to be secondary to acute  cardiorenal syndrome.  Serologic work up has been negative  First treatment on 5/25.  Dialysis yesterday.  Tunneled catheter for later today.  Hemodialysis for today. Orders prepared.   2. Hypertension: blood pressure are elevated Echo with systolic function slightly decreased, 45-50% EF With anxiety.  Holding diuretics.  Metoprolol, hydralazine and isosorbide mononitrate Suspect hypertension is volume driven.   3. Anemia with acute renal failure: macrocytic. PRBC transfusion 5/28.  EPO as per hematology. Will administer today with treatment.    LOS: 8 Nicolis Boody 5/29/202012:25 PM

## 2018-08-14 NOTE — Progress Notes (Signed)
   08/14/18 1400  Clinical Encounter Type  Visited With Patient;Other (Comment) (Family friend)  Visit Type Initial;Spiritual support  Referral From Other (Comment)  Consult/Referral To Chaplain  Spiritual Encounters  Spiritual Needs Prayer  Stress Factors  Patient Stress Factors Health changes   Chaplain received a call from a friend of the patient's Dorris Fetch) who is on the spiritual care staff at [facility]. She expressed concern for the patient and asked if someone could check on/in with her and provide some spiritual support. This Probation officer expressed a willingness to do so.  Upon arrival to the patient's room, the patient was laying in bed with the bipap in place. Her breathing was labored and her eyes were closed. She opened her eyes to verbal stimuli and expressed gratitude for the visit. This Probation officer affirmed the love and care this patient has from family and friends, as evidenced by the call received from Dorris Fetch. At this, the patient became tearful. The chaplain offered support in the form of compassionate presence, calming words encouraging the patient to focus on her breathing, and a reminder of the presence of God. Chaplain also offered to pray for her health and improved breathing, which the patient accepted. Chaplain will call the patient's sister and check back on her later.

## 2018-08-14 NOTE — Consult Note (Addendum)
[facility] Face-to-Face Psychiatry Consult follow-up  Reason for Consult:  Anxiety Referring Physician:  Dr. Posey Pronto Patient Identification: Jessica Burgess MRN:  [id number] Principal Diagnosis: ESRD on dialysis [facility]) Diagnosis:  Principal Problem:   ESRD on dialysis [facility]) Active Problems:   Anxiety   Insomnia, persistent   Major depression, chronic ([place])   Generalized anxiety disorder   Acute CHF ([place])   Acute CHF (congestive heart failure) ([place])  Patient is seen, chart is reviewed, collateral and communication with nursing regarding patient having increased anxiety in the morning.  Total Time spent with patient: 20 minutes  Subjective:  "I like it (the Klonopin), feel better."  HPI:  Jessica Burgess is a 63 y.o. female patient with a known history of ovarian cancer, CKD, hypothyroidism, hypertension and anemia.  Patient is currently undergoing chemotherapy followed by Dr. Mellody Drown and Dr. Grayland Ormond.  Patient reports having had chemotherapy infusion on yesterday however over the last 3 days she has received infusion of packed red blood cells as well as 2 L IV fluids according to the patient report.  She now presents to the emergency room with a 2-day history of increased shortness of breath and nonproductive cough.  Shortness of breath is made worse when lying flat.  She denies edema.  She denies chest pain.  She denies abdominal pain.  Patient denies fevers, chills, nausea, vomiting, or diarrhea.  Patient denies a prior known history of CHF.  However she is aware of renal failure occurring since March 2020.  CT abdomen was completed which demonstrated no evidence of hydronephrosis or urinary stone disease.  Chest x-ray demonstrated no significant interval change in cardiomegaly with mild vascular congestion and diffuse interstitial opacity.  No pleural effusion or pneumothorax identified.  However, patient developed increased shortness of breath while in the emergency room and was therefore  placed on BiPAP therapy to decrease work of breathing.  Psychiatry consult is requested for evaluation and assistance with management of anxiety.  08/11/2018: On evaluation, patient is calm and cooperative.  She is alert and oriented x4.  Patient describes her mood as stable, with ups and downs related to her cancer diagnosis in 2018.  She expresses that she has developed congestive heart failure and recently renal failure, "secondary to bad judgment.  I should have gotten the blood transfusion, but should not have gotten the extra fluids that worsened my heart failure."  Patient expresses usual level of anxiety while hospitalized.  She states that she has more anxiety at home.  Patient lives with a roommate, and she describes increasing anxiety about 1 hour before when room-mate comes home because her room-mate complains about work not getting done at the house.  Patient reports that she is "happy" in the morning and through the day.  She states she falls asleep easily with Restoril, but it  wears off at 3 AM.  Patient denies SI, HI, AVH.  She denies history of mania or psychosis.  08/12/2018: On evaluation, patient is laying in bed with migraine pain.  She reports that Klonopin that she received this morning was helpful in managing her anxiety, however she feels sedated and dizzy following the 1 mg dose.  She reports that she slept well last night.  Review of records note that she did not require additional Restoril.  Patient is agreeable to low-dose Klonopin twice daily to manage chronic anxiety related to her multiple medical comorbidities.  Patient expresses nausea today with migraine, however reports her appetite is unchanged.  She  is denying SI, HI, AVH.  08/14/18: On evaluation, patient is lying in bed with her CPAP machine, resting quietly with her eyes closed prior to assessment.  Her sitter, a Therapist, sports, who has been with her reports her anxiety has definitely improved with better toleration of dialysis.   She was concerned about the patient being "more sleepy".  When this provider suggested decreasing the dose, the patient said she "liked it".  Reports her anxiety is much better and prefers the current dose, no changes made.  Past Psychiatric History: Depression, anxiety, insomnia  Risk to Self:  no Risk to Others:  no Prior Inpatient Therapy:  none Prior Outpatient Therapy:  none  Past Medical History:  Past Medical History:  Diagnosis Date  . Allergic rhinitis, cause unspecified   . Anxiety state, unspecified   . Arthritis   . Asthma    only when sick   . Backache, unspecified   . Bronchitis    hx of when get sick  . Cancer ([place])    skin cancer , basal cell   . Cancer ([place]) 11/2016   ovarian  . Cervicalgia   . Complication of anesthesia   . Dermatophytosis of nail   . Dysmetabolic syndrome X   . Encounter for long-term (current) use of other medications   . Esophageal reflux   . Insomnia, unspecified   . Leukocytosis, unspecified   . Migraine without aura, without mention of intractable migraine without mention of status migrainosus   . Other and unspecified hyperlipidemia   . Other malaise and fatigue   . Overweight(278.02)   . Personal history of chemotherapy now   ovarian  . PONV (postoperative nausea and vomiting)   . Spinal stenosis in cervical region   . Symptomatic menopausal or female climacteric states   . Unspecified disorder of skin and subcutaneous tissue   . Unspecified vitamin D deficiency     Past Surgical History:  Procedure Laterality Date  . ABDOMINAL HYSTERECTOMY    . ANTERIOR CERVICAL DECOMP/DISCECTOMY FUSION N/A 06/07/2015   Procedure: Cervical three - four and Cervical six- seven anterior cervical decompression with fusion interbody prosthesis plating and bonegraft;  Surgeon: Newman Pies, MD;  Location: [place] [facility];  Service: Neurosurgery;  Laterality: N/A;  C34 and C67 anterior cervical decompression with fusion interbody prosthesis plating  and bonegraft  . BACK SURGERY     x2 Lower   . EVACUATION OF CERVICAL HEMATOMA N/A 06/14/2015   Procedure: EVACUATION OF CERVICAL HEMATOMA;  Surgeon: Newman Pies, MD;  Location: [place] [facility];  Service: Neurosurgery;  Laterality: N/A;  . NECK SURGERY     x3  . TONSILLECTOMY     Family History:  Family History  Problem Relation Age of Onset  . Depression Mother   . Migraines Mother   . Dementia Father   . Diabetes Father   . Hyperlipidemia Father   . Hyperlipidemia Brother   . Hyperlipidemia Brother   . Breast cancer Paternal Aunt        32s   Family Psychiatric  History: "a lot on my mother's side - depression" No suicides   Social History:  Social History   Substance and Sexual Activity  Alcohol Use Not Currently  . Alcohol/week: 0.0 standard drinks     Social History   Substance and Sexual Activity  Drug Use No    Social History   Socioeconomic History  . Marital status: Single    Spouse name: Not on file  . Number  of children: 0  . Years of education: some college  . Highest education level: 12th grade  Occupational History    Employer: DISABLED  . Occupation: Disabled   Social Needs  . Financial resource strain: Very hard  . Food insecurity:    Worry: Never true    Inability: Never true  . Transportation needs:    Medical: No    Non-medical: No  Tobacco Use  . Smoking status: Former Smoker    Packs/day: 1.50    Years: 20.00    Pack years: 30.00    Types: Cigarettes    Start date: 03/19/1979    Last attempt to quit: 08/25/1999    Years since quitting: 18.9  . Smokeless tobacco: Never Used  . Tobacco comment: smoking cessation materials not required  Substance and Sexual Activity  . Alcohol use: Not Currently    Alcohol/week: 0.0 standard drinks  . Drug use: No  . Sexual activity: Never  Lifestyle  . Physical activity:    Days per week: 0 days    Minutes per session: 0 min  . Stress: Very much  Relationships  . Social connections:     Talks on phone: Patient refused    Gets together: Patient refused    Attends religious service: Patient refused    Active member of club or organization: Patient refused    Attends meetings of clubs or organizations: Patient refused    Relationship status: Patient refused  Other Topics Concern  . Not on file  Social History Narrative   Patient is single.    Patient lives with roommates.    Patient on disability    Patient has no children.    Patient has some college    Additional Social History:   Living with a room-mate Independent with her care. 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 disability  Substance use: Couple glasses alcohol Every 6-8 months No tobacco No caffeine No marijuana or other illicit drugs  Allergies:  No Known Allergies  Labs:  Results for orders placed or performed during the hospital encounter of 08/06/18 (from the past 48 hour(s))  Renal function panel     Status: Abnormal   Collection Time: 08/13/18  5:26 AM  Result Value Ref Range   Sodium 136 135 - 145 mmol/L   Potassium 3.7 3.5 - 5.1 mmol/L   Chloride 97 (L) 98 - 111 mmol/L   CO2 28 22 - 32 mmol/L   Glucose, Bld 95 70 - 99 mg/dL   BUN 53 (H) 8 - 23 mg/dL   Creatinine, Ser 4.02 (H) 0.44 - 1.00 mg/dL   Calcium 7.9 (L) 8.9 - 10.3 mg/dL   Phosphorus 4.4 2.5 - 4.6 mg/dL   Albumin 3.0 (L) 3.5 - 5.0 g/dL   GFR calc non Af Amer 11 (L) >60 mL/min   GFR calc Af Amer 13 (L) >60 mL/min   Anion gap 11 5 - 15    Comment: Performed at [facility], [address] [place], [place] [address]  CBC     Status: Abnormal   Collection Time: 08/13/18  5:26 AM  Result Value Ref Range   WBC 7.5 4.0 - 10.5 K/uL   RBC 1.87 (L) 3.87 - 5.11 MIL/uL   Hemoglobin 6.5 (L) 12.0 - 15.0 g/dL   HCT 21.3 (L) 36.0 - 46.0 %   MCV 113.9 (H) 80.0 - 100.0 fL   MCH  34.8 (H) 26.0 - 34.0 pg   MCHC 30.5 30.0 - 36.0 g/dL   RDW 22.3 (H) 11.5 - 15.5 %    Platelets 94 (L) 150 - 400 K/uL    Comment: Immature Platelet Fraction may be clinically indicated, consider ordering this additional test [id number]    nRBC 0.3 (H) 0.0 - 0.2 %    Comment: Performed at [facility], [address] [place], [place] [address]  Prepare RBC     Status: None   Collection Time: 08/13/18 10:17 AM  Result Value Ref Range   Order Confirmation      ORDER PROCESSED BY BLOOD BANK Performed at [facility], [address] [place], [place] [address]   Type and screen [place]     Status: None   Collection Time: 08/13/18 10:39 AM  Result Value Ref Range   ABO/RH(D) B POS    Antibody Screen NEG    Sample Expiration 08/16/2018,2359    Unit Number [id number]    Blood Component Type RBC, LR IRR    Unit division 00    Status of Unit ISSUED,FINAL    Transfusion Status OK TO TRANSFUSE    Crossmatch Result      Compatible Performed at [facility], [address] [place], [place] [address]   Blood gas, arterial     Status: Abnormal   Collection Time: 08/13/18  1:42 PM  Result Value Ref Range   FIO2 0.32    Delivery systems NASAL CANNULA    pH, Arterial 7.43 7.350 - 7.450   pCO2 arterial 41 32.0 - 48.0 mmHg   pO2, Arterial 45 (L) 83.0 - 108.0 mmHg   Bicarbonate 27.2 20.0 - 28.0 mmol/L   Acid-Base Excess 2.6 (H) 0.0 - 2.0 mmol/L   O2 Saturation 82.1 %   Patient temperature 37.0    Collection site RIGHT RADIAL    Sample type ARTERIAL DRAW    Allens test (pass/fail) PASS PASS    Comment: Performed at [facility], [address] [place],  [address]  Basic metabolic panel     Status: Abnormal   Collection Time: 08/14/18  3:36 AM  Result Value Ref Range   Sodium 132 (L) 135 - 145 mmol/L   Potassium 4.0 3.5 - 5.1 mmol/L   Chloride 96 (L) 98 - 111 mmol/L   CO2 22 22 - 32 mmol/L   Glucose, Bld 150 (H) 70 - 99 mg/dL   BUN 67 (H) 8 - 23 mg/dL   Creatinine, Ser 5.02 (H) 0.44 - 1.00 mg/dL    Calcium 8.0 (L) 8.9 - 10.3 mg/dL   GFR calc non Af Amer 9 (L) >60 mL/min   GFR calc Af Amer 10 (L) >60 mL/min   Anion gap 14 5 - 15    Comment: Performed at [facility], [address] [place], [place] [address]  Glucose, capillary     Status: Abnormal   Collection Time: 08/14/18  1:05 PM  Result Value Ref Range   Glucose-Capillary 185 (H) 70 - 99 mg/dL  CBC     Status: Abnormal   Collection Time: 08/14/18  3:43 PM  Result Value Ref Range   WBC 10.7 (H) 4.0 - 10.5 K/uL   RBC 2.51 (L) 3.87 - 5.11 MIL/uL   Hemoglobin 8.5 (L) 12.0 - 15.0 g/dL    Comment: POST TRANSFUSION SPECIMEN   HCT 25.5 (L) 36.0 - 46.0 %   MCV 101.6 (H) 80.0 - 100.0  fL   MCH 33.9 26.0 - 34.0 pg   MCHC 33.3 30.0 - 36.0 g/dL   RDW 23.7 (H) 11.5 - 15.5 %   Platelets 124 (L) 150 - 400 K/uL   nRBC 0.9 (H) 0.0 - 0.2 %    Comment: Performed at [facility], [address] [place], [place] [address]    Current Facility-Administered Medications  Medication Dose Route Frequency Provider Last Rate Last Dose  . 0.9 %  sodium chloride infusion (Manually program via Guardrails IV Fluids)   Intravenous Once Lavonia Dana, MD   Stopped at 08/13/18 1206  . 0.9 %  sodium chloride infusion  250 mL Intravenous PRN Seals, Theo Dills, NP      . acetaminophen (TYLENOL) tablet 650 mg  650 mg Oral Q4H PRN Seals, Angela H, NP      . albuterol (PROVENTIL) (2.5 MG/3ML) 0.083% nebulizer solution 2.5 mg  2.5 mg Nebulization Q2H PRN Fritzi Mandes, MD      . atorvastatin (LIPITOR) tablet 40 mg  40 mg Oral q1800 Larey Dresser, MD   40 mg at 08/13/18 1947  . clonazePAM (KLONOPIN) tablet 1 mg  1 mg Oral BID Lavella Hammock, MD   1 mg at 08/14/18 1538  . cyclobenzaprine (FLEXERIL) tablet 10 mg  10 mg Oral TID PRN Bradly Bienenstock, NP      . Derrill Memo ON 08/15/2018] DULoxetine (CYMBALTA) DR capsule 30 mg  30 mg Oral Daily Fritzi Mandes, MD      . feeding supplement (NEPRO CARB STEADY) liquid 237 mL  237 mL Oral BID BM Fritzi Mandes, MD   Stopped at 08/14/18 1558  . ferrous sulfate tablet 325 mg  325 mg Oral Daily Darel Hong D, NP   325 mg at 08/13/18 3299  . fluticasone (FLONASE) 50 MCG/ACT nasal spray 2 spray  2 spray Each Nare PRN Bradly Bienenstock, NP   2 spray at 08/12/18 [phone]  . furosemide (LASIX) 250 mg in dextrose 5 % 250 mL (1 mg/mL) infusion  8 mg/hr Intravenous Continuous Kolluru, Sarath, MD 8 mL/hr at 08/14/18 1357 8 mg/hr at 08/14/18 1357  . heparin 10000 UNIT/ML injection           . hydrALAZINE (APRESOLINE) injection 10-20 mg  10-20 mg Intravenous Q6H PRN Awilda Bill, NP   20 mg at 08/14/18 1410  . hydrALAZINE (APRESOLINE) tablet 25 mg  25 mg Oral TID Larey Dresser, MD   Stopped at 08/14/18 1558  . ipratropium-albuterol (DUONEB) 0.5-2.5 (3) MG/3ML nebulizer solution 3 mL  3 mL Nebulization Q6H Fritzi Mandes, MD   3 mL at 08/14/18 1337  . isosorbide mononitrate (IMDUR) 24 hr tablet 30 mg  30 mg Oral Daily Fritzi Mandes, MD   Stopped at 08/14/18 1558  . labetalol (NORMODYNE) injection 20 mg  20 mg Intravenous Q6H PRN Darel Hong D, NP   10 mg at 08/14/18 1552  . levothyroxine (SYNTHROID) tablet 25 mcg  25 mcg Oral Q0600 Paticia Stack, [place]   25 mcg at 08/13/18 8341  . lidocaine (XYLOCAINE) 2 % viscous mouth solution 15 mL  15 mL Mouth/Throat PRN Darel Hong D, NP   15 mL at 08/07/18 1636  . lidocaine-EPINEPHrine (XYLOCAINE-EPINEPHrine) 1 %-1:200000 (PF) injection           . [START ON 08/15/2018] methylPREDNISolone sodium succinate (SOLU-MEDROL) 125 mg/2 mL injection 60 mg  60 mg Intravenous Daily Fritzi Mandes, MD      . metoprolol  succinate (TOPROL-XL) 24 hr tablet 50 mg  50 mg Oral Daily Rise Mu, PA-C   Stopped at 08/14/18 1039  . mometasone-formoterol (DULERA) 200-5 MCG/ACT inhaler 2 puff  2 puff Inhalation BID Darel Hong D, NP   2 puff at 08/14/18 1044  . multivitamin (RENA-VIT) tablet 1 tablet  1 tablet Oral QHS Fritzi Mandes, MD   1 tablet at 08/13/18 2138  . ondansetron  (ZOFRAN) injection 4 mg  4 mg Intravenous Q6H PRN Stegmayer, Kimberly A, PA-C      . oxyCODONE (Oxy IR/ROXICODONE) immediate release tablet 5 mg  5 mg Oral Q6H PRN Mansy, Jan A, MD   5 mg at 08/14/18 1210  . Racepinephrine HCl 2.25 % nebulizer solution 0.5 mL  0.5 mL Nebulization Q4H PRN Fritzi Mandes, MD   0.5 mL at 08/14/18 1335  . sodium chloride flush (NS) 0.9 % injection 10 mL  10 mL Intravenous Q12H Fritzi Mandes, MD   10 mL at 08/14/18 1045  . sodium chloride flush (NS) 0.9 % injection 3 mL  3 mL Intravenous Q12H Seals, Angela H, NP   3 mL at 08/14/18 1045  . sodium chloride flush (NS) 0.9 % injection 3 mL  3 mL Intravenous PRN Seals, Theo Dills, NP      . SUMAtriptan (IMITREX) tablet 100 mg  100 mg Oral TID PRN Fritzi Mandes, MD   100 mg at 08/14/18 0020   Facility-Administered Medications Ordered in Other Encounters  Medication Dose Route Frequency Provider Last Rate Last Dose  . 0.9 %  sodium chloride infusion   Intravenous Once Lloyd Huger, MD      . 0.9 %  sodium chloride infusion   Intravenous Once Lloyd Huger, MD      . dexamethasone (DECADRON) 20 mg in sodium chloride 0.9 % 50 mL IVPB  20 mg Intravenous Once Lloyd Huger, MD      . dexamethasone (DECADRON) injection 10 mg  10 mg Intravenous Once Lloyd Huger, MD      . sodium chloride flush (NS) 0.9 % injection 10 mL  10 mL Intravenous PRN Lloyd Huger, MD   10 mL at 06/16/17 5573    Musculoskeletal: Strength & Muscle Tone: within normal limits Gait & Station: not assessed Patient leans: N/A  Psychiatric Specialty Exam: Physical Exam  Nursing note and vitals reviewed. Constitutional: She is oriented to person, place, and time. She appears well-developed and well-nourished. No distress.  HENT:  Head: Normocephalic and atraumatic.  Eyes: EOM are normal.  Neck: Normal range of motion.  Cardiovascular: Normal rate and regular rhythm.  Respiratory: Effort normal. No respiratory distress.   Musculoskeletal: Normal range of motion.  Neurological: She is alert and oriented to person, place, and time.  Psychiatric: Her speech is normal and behavior is normal. Judgment and thought content normal. Her mood appears anxious. Cognition and memory are normal. She exhibits a depressed mood.    Review of Systems  Constitutional: Positive for malaise/fatigue.  Respiratory: Negative.   Cardiovascular: Negative.   Musculoskeletal: Negative.   Neurological: Positive for weakness.  Psychiatric/Behavioral: Positive for depression. Negative for hallucinations, memory loss, substance abuse and suicidal ideas. The patient is nervous/anxious.     Blood pressure 128/89, pulse 85, temperature 97.6 F (36.4 C), temperature source Axillary, resp. rate (!) 21, height 5\' 2"  (1.575 m), weight 80 kg, SpO2 100 %.Body mass index is 32.26 kg/m.  General Appearance: Casual and holding her head pain  Eye Contact:  Minimal  Speech:  Clear and Coherent and Normal Rate  Volume:  Normal  Mood:  Anxious and Dysphoric, uncomfortable  Affect:  Congruent  Thought Process:  Coherent, Linear and Descriptions of Associations: Intact  Orientation:  Full (Time, Place, and Person)  Thought Content:  Logical and Hallucinations: None  Suicidal Thoughts:  No  Homicidal Thoughts:  No  Memory:  Immediate;   Good Recent;   Good Remote;   Good  Judgement:  Good  Insight:  Good  Psychomotor Activity:  Normal  Concentration:  Concentration: Good  Recall:  Good  Fund of Knowledge:  Good  Language:  Good  Akathisia:  No  Handed:  Right  AIMS (if indicated):     Assets:  Communication Skills Desire for Improvement Financial Resources/Insurance Housing Resilience Social Support  ADL's:  Intact  Cognition:  WNL  Sleep:   Slept well overnight     Treatment Plan Summary: Daily contact with patient to assess and evaluate symptoms and progress in treatment and Medication management Major depression, chronic and  Anxiety: -Continue Cymbalta 60 mg daily as this appears to be effective in treating patient's depression. -Continue Klonopin 1 mg twice daily.   -Dr Dwyane Dee reviewed this client and concurs with the plan of care  Disposition: No evidence of imminent risk to self or others at present.   Patient does not meet criteria for psychiatric inpatient admission. Supportive therapy provided about ongoing stressors.  Waylan Boga, NP 08/14/2018 5:48 PM

## 2018-08-14 NOTE — Plan of Care (Signed)
  Problem: Coping: Goal: Level of anxiety will decrease Outcome: Not Progressing Note:  Patient continue to be anxious throughout the night

## 2018-08-14 NOTE — Progress Notes (Signed)
Patient's femoral cath functioning poorly - flushed lines, new set up -- still functioning poorly, unable to run prescribed rate and blood return to both lumens shows resistance. Seems to be an issue with catheter having been dislodged/moved out slightly since it was originally placed.  Plans for perm cath tmro. Dr. Juleen China notified. Will continue dialysis at lower blood flow rate as long as catheter allows - at slow rate it is likely to clot off / lose blood - will d/c tx prior to any blood loss in order to prevent further decline in Hgb and start tomorrow with a full tx using new access.

## 2018-08-14 NOTE — Progress Notes (Signed)
HD Tx End  Tx ends early, as femoral cath continues to function poorly after new system setup. 2 liters net fluid removal this tx. Goal was 3-4 liters -- will attempt remaining fluid removal tomorrow after PC placement. Blood return to both lumens shows resistance. Seems to be an issue with catheter having been dislodged/moved out slightly since its original placement.   Plans for perm cath tmro. Dr. Juleen China notified. Will begin tomorrow with a full HD tx using new access.     08/14/18 1845  Vital Signs  Pulse Rate 78  Resp 13  BP 120/86  Oxygen Therapy  SpO2 100 %  Pain Assessment  Pain Scale 0-10  Pain Score 0  Dialysis Weight  Weight 78.4 kg  Type of Weight Post-Dialysis  During Hemodialysis Assessment  Blood Flow Rate (mL/min) 250 mL/min  Arterial Pressure (mmHg) -190 mmHg  Venous Pressure (mmHg) 300 mmHg  Transmembrane Pressure (mmHg) 60 mmHg  Ultrafiltration Rate (mL/min) 1010 mL/min  Dialysate Flow Rate (mL/min) 600 ml/min  Conductivity: Machine  14  HD Safety Checks Performed Yes  Dialysis Fluid Bolus Normal Saline  Bolus Amount (mL) 250 mL  Intra-Hemodialysis Comments Tx completed (Ended early d/t cvc dysfunction.flow resistant/backflow)  Post-Hemodialysis Assessment  Rinseback Volume (mL) 250 mL  Dialyzer Clearance Heavily streaked  Duration of HD Treatment -hour(s) 2 hour(s)  Hemodialysis Intake (mL) 1000 mL  UF Total -Machine (mL) 3000 mL  Net UF (mL) 2000 mL  Tolerated HD Treatment Yes

## 2018-08-15 ENCOUNTER — Inpatient Hospital Stay: Payer: PPO

## 2018-08-15 DIAGNOSIS — Z95828 Presence of other vascular implants and grafts: Secondary | ICD-10-CM

## 2018-08-15 DIAGNOSIS — J9600 Acute respiratory failure, unspecified whether with hypoxia or hypercapnia: Secondary | ICD-10-CM

## 2018-08-15 LAB — COMPREHENSIVE METABOLIC PANEL
ALT: 19 U/L (ref 0–44)
AST: 38 U/L (ref 15–41)
Albumin: 3.3 g/dL — ABNORMAL LOW (ref 3.5–5.0)
Alkaline Phosphatase: 61 U/L (ref 38–126)
Anion gap: 18 — ABNORMAL HIGH (ref 5–15)
BUN: 89 mg/dL — ABNORMAL HIGH (ref 8–23)
CO2: 21 mmol/L — ABNORMAL LOW (ref 22–32)
Calcium: 8.2 mg/dL — ABNORMAL LOW (ref 8.9–10.3)
Chloride: 97 mmol/L — ABNORMAL LOW (ref 98–111)
Creatinine, Ser: 5.65 mg/dL — ABNORMAL HIGH (ref 0.44–1.00)
GFR calc Af Amer: 9 mL/min — ABNORMAL LOW (ref >60)
GFR calc non Af Amer: 7 mL/min — ABNORMAL LOW (ref >60)
Glucose, Bld: 224 mg/dL — ABNORMAL HIGH (ref 70–99)
Potassium: 4 mmol/L (ref 3.5–5.1)
Sodium: 136 mmol/L (ref 135–145)
Total Bilirubin: 1.4 mg/dL — ABNORMAL HIGH (ref 0.3–1.2)
Total Protein: 6.2 g/dL — ABNORMAL LOW (ref 6.5–8.1)

## 2018-08-15 LAB — CBC WITH DIFFERENTIAL/PLATELET
Abs Immature Granulocytes: 0.62 10*3/uL — ABNORMAL HIGH (ref 0.00–0.07)
Basophils Absolute: 0 10*3/uL (ref 0.0–0.1)
Basophils Relative: 0 %
Eosinophils Absolute: 0 10*3/uL (ref 0.0–0.5)
Eosinophils Relative: 0 %
HCT: 24.1 % — ABNORMAL LOW (ref 36.0–46.0)
Hemoglobin: 8 g/dL — ABNORMAL LOW (ref 12.0–15.0)
Immature Granulocytes: 5 %
Lymphocytes Relative: 6 %
Lymphs Abs: 0.7 10*3/uL (ref 0.7–4.0)
MCH: 34 pg (ref 26.0–34.0)
MCHC: 33.2 g/dL (ref 30.0–36.0)
MCV: 102.6 fL — ABNORMAL HIGH (ref 80.0–100.0)
Monocytes Absolute: 1.4 10*3/uL — ABNORMAL HIGH (ref 0.1–1.0)
Monocytes Relative: 12 %
Neutro Abs: 9.2 10*3/uL — ABNORMAL HIGH (ref 1.7–7.7)
Neutrophils Relative %: 77 %
Platelets: 91 10*3/uL — ABNORMAL LOW (ref 150–400)
RBC: 2.35 MIL/uL — ABNORMAL LOW (ref 3.87–5.11)
RDW: 24.6 % — ABNORMAL HIGH (ref 11.5–15.5)
Smear Review: DECREASED
WBC: 11.9 10*3/uL — ABNORMAL HIGH (ref 4.0–10.5)
nRBC: 1.3 % — ABNORMAL HIGH (ref 0.0–0.2)

## 2018-08-15 MED ORDER — CHLORHEXIDINE GLUCONATE CLOTH 2 % EX PADS
6.0000 | MEDICATED_PAD | Freq: Every day | CUTANEOUS | Status: DC
  Administered 2018-08-15 – 2018-08-17 (×3): 6 via TOPICAL

## 2018-08-15 MED ORDER — EPOETIN ALFA 10000 UNIT/ML IJ SOLN
10000.0000 [IU] | INTRAMUSCULAR | Status: DC
  Administered 2018-08-15 – 2018-08-17 (×2): 10000 [IU] via INTRAVENOUS
  Filled 2018-08-15: qty 1

## 2018-08-15 MED ORDER — FENTANYL CITRATE (PF) 100 MCG/2ML IJ SOLN
25.0000 ug | Freq: Once | INTRAMUSCULAR | Status: AC
  Administered 2018-08-15: 12.5 ug via INTRAVENOUS
  Filled 2018-08-15: qty 2

## 2018-08-15 MED ORDER — LORAZEPAM 2 MG/ML IJ SOLN
0.5000 mg | Freq: Once | INTRAMUSCULAR | Status: AC
  Administered 2018-08-15: 0.5 mg via INTRAVENOUS
  Filled 2018-08-15: qty 1

## 2018-08-15 MED ORDER — ZOLPIDEM TARTRATE 5 MG PO TABS
5.0000 mg | ORAL_TABLET | Freq: Every evening | ORAL | Status: DC | PRN
  Administered 2018-08-15 – 2018-08-16 (×2): 5 mg via ORAL
  Filled 2018-08-15 (×2): qty 1

## 2018-08-15 MED ORDER — HEPARIN SODIUM (PORCINE) 1000 UNIT/ML IJ SOLN
3000.0000 [IU] | Freq: Once | INTRAMUSCULAR | Status: AC
  Administered 2018-08-15: 3000 [IU] via INTRAVENOUS
  Filled 2018-08-15: qty 3

## 2018-08-15 NOTE — Consult Note (Signed)
[facility] Face-to-Face Psychiatry Consult follow-up  Reason for Consult:  Anxiety Referring Physician:  Dr. Posey Pronto Patient Identification: Jessica Burgess MRN:  [id number] Principal Diagnosis: ESRD on dialysis [facility]) Diagnosis:  Principal Problem:   ESRD on dialysis [facility]) Active Problems:   Anxiety   Insomnia, persistent   Major depression, chronic ([place])   Generalized anxiety disorder   Status post insertion of dialysis catheter ([place])   Acute CHF ([place])   Acute CHF (congestive heart failure) ([place])   Acute respiratory failure ([place])  Patient is seen, chart is reviewed, collateral and communication with nursing regarding patient having increased anxiety in the morning.  Total Time spent with patient: 20 minutes  Subjective:  Her anxiety is "better", denies depression.  HPI:  Jessica Burgess is a 63 y.o. female patient with a known history of ovarian cancer, CKD, hypothyroidism, hypertension and anemia.  Patient is currently undergoing chemotherapy followed by Dr. Mellody Drown and Dr. Grayland Ormond.  Patient reports having had chemotherapy infusion on yesterday however over the last 3 days she has received infusion of packed red blood cells as well as 2 L IV fluids according to the patient report.  She now presents to the emergency room with a 2-day history of increased shortness of breath and nonproductive cough.  Shortness of breath is made worse when lying flat.  She denies edema.  She denies chest pain.  She denies abdominal pain.  Patient denies fevers, chills, nausea, vomiting, or diarrhea.  Patient denies a prior known history of CHF.  However she is aware of renal failure occurring since March 2020.  CT abdomen was completed which demonstrated no evidence of hydronephrosis or urinary stone disease.  Chest x-ray demonstrated no significant interval change in cardiomegaly with mild vascular congestion and diffuse interstitial opacity.  No pleural effusion or pneumothorax identified.  However,  patient developed increased shortness of breath while in the emergency room and was therefore placed on BiPAP therapy to decrease work of breathing.  Psychiatry consult is requested for evaluation and assistance with management of anxiety.  08/11/2018: On evaluation, patient is calm and cooperative.  She is alert and oriented x4.  Patient describes her mood as stable, with ups and downs related to her cancer diagnosis in 2018.  She expresses that she has developed congestive heart failure and recently renal failure, "secondary to bad judgment.  I should have gotten the blood transfusion, but should not have gotten the extra fluids that worsened my heart failure."  Patient expresses usual level of anxiety while hospitalized.  She states that she has more anxiety at home.  Patient lives with a roommate, and she describes increasing anxiety about 1 hour before when room-mate comes home because her room-mate complains about work not getting done at the house.  Patient reports that she is "happy" in the morning and through the day.  She states she falls asleep easily with Restoril, but it  wears off at 3 AM.  Patient denies SI, HI, AVH.  She denies history of mania or psychosis.  08/12/2018: On evaluation, patient is laying in bed with migraine pain.  She reports that Klonopin that she received this morning was helpful in managing her anxiety, however she feels sedated and dizzy following the 1 mg dose.  She reports that she slept well last night.  Review of records note that she did not require additional Restoril.  Patient is agreeable to low-dose Klonopin twice daily to manage chronic anxiety related to her multiple medical comorbidities.  Patient expresses nausea today with migraine, however reports her appetite is unchanged.  She is denying SI, HI, AVH.  08/14/18: On evaluation, patient is lying in bed with her CPAP machine, resting quietly with her eyes closed prior to assessment.  Her sitter, a Therapist, sports, who has  been with her reports her anxiety has definitely improved with better toleration of dialysis.  She was concerned about the patient being "more sleepy".  When this provider suggested decreasing the dose, the patient said she "liked it".  Reports her anxiety is much better and prefers the current dose, no changes made.  08/15/18: Patient lying in bed with CPAP in place, some anxiety but reports it is improved with the Klonopin.  Denies any other issues. Sleep was "good" with no nausea or headache today.    Past Psychiatric History: Depression, anxiety, insomnia  Risk to Self:  no Risk to Others:  no Prior Inpatient Therapy:  none Prior Outpatient Therapy:  none  Past Medical History:  Past Medical History:  Diagnosis Date  . Allergic rhinitis, cause unspecified   . Anxiety state, unspecified   . Arthritis   . Asthma    only when sick   . Backache, unspecified   . Bronchitis    hx of when get sick  . Cancer ([place])    skin cancer , basal cell   . Cancer ([place]) 11/2016   ovarian  . Cervicalgia   . Complication of anesthesia   . Dermatophytosis of nail   . Dysmetabolic syndrome X   . Encounter for long-term (current) use of other medications   . Esophageal reflux   . Insomnia, unspecified   . Leukocytosis, unspecified   . Migraine without aura, without mention of intractable migraine without mention of status migrainosus   . Other and unspecified hyperlipidemia   . Other malaise and fatigue   . Overweight(278.02)   . Personal history of chemotherapy now   ovarian  . PONV (postoperative nausea and vomiting)   . Spinal stenosis in cervical region   . Symptomatic menopausal or female climacteric states   . Unspecified disorder of skin and subcutaneous tissue   . Unspecified vitamin D deficiency     Past Surgical History:  Procedure Laterality Date  . ABDOMINAL HYSTERECTOMY    . ANTERIOR CERVICAL DECOMP/DISCECTOMY FUSION N/A 06/07/2015   Procedure: Cervical three - four and  Cervical six- seven anterior cervical decompression with fusion interbody prosthesis plating and bonegraft;  Surgeon: Newman Pies, MD;  Location: [place];  Service: Neurosurgery;  Laterality: N/A;  C34 and C67 anterior cervical decompression with fusion interbody prosthesis plating and bonegraft  . BACK SURGERY     x2 Lower   . EVACUATION OF CERVICAL HEMATOMA N/A 06/14/2015   Procedure: EVACUATION OF CERVICAL HEMATOMA;  Surgeon: Newman Pies, MD;  Location: [facility];  Service: Neurosurgery;  Laterality: N/A;  . NECK SURGERY     x3  . TONSILLECTOMY     Family History:  Family History  Problem Relation Age of Onset  . Depression Mother   . Migraines Mother   . Dementia Father   . Diabetes Father   . Hyperlipidemia Father   . Hyperlipidemia Brother   . Hyperlipidemia Brother   . Breast cancer Paternal Aunt        98s   Family Psychiatric  History: "a lot on my mother's side - depression" No suicides   Social History:  Social History   Substance and Sexual Activity  Alcohol  Use Not Currently  . Alcohol/week: 0.0 standard drinks     Social History   Substance and Sexual Activity  Drug Use No    Social History   Socioeconomic History  . Marital status: Single    Spouse name: Not on file  . Number of children: 0  . Years of education: some college  . Highest education level: 12th grade  Occupational History    Employer: DISABLED  . Occupation: Disabled   Social Needs  . Financial resource strain: Very hard  . Food insecurity:    Worry: Never true    Inability: Never true  . Transportation needs:    Medical: No    Non-medical: No  Tobacco Use  . Smoking status: Former Smoker    Packs/day: 1.50    Years: 20.00    Pack years: 30.00    Types: Cigarettes    Start date: 03/19/1979    Last attempt to quit: 08/25/1999    Years since quitting: 18.9  . Smokeless tobacco: Never Used  . Tobacco comment: smoking cessation materials not required  Substance  and Sexual Activity  . Alcohol use: Not Currently    Alcohol/week: 0.0 standard drinks  . Drug use: No  . Sexual activity: Never  Lifestyle  . Physical activity:    Days per week: 0 days    Minutes per session: 0 min  . Stress: Very much  Relationships  . Social connections:    Talks on phone: Patient refused    Gets together: Patient refused    Attends religious service: Patient refused    Active member of club or organization: Patient refused    Attends meetings of clubs or organizations: Patient refused    Relationship status: Patient refused  Other Topics Concern  . Not on file  Social History Narrative   Patient is single.    Patient lives with roommates.    Patient on disability    Patient has no children.    Patient has some college    Additional Social History:   Living with a room-mate Independent with her care. 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 disability  Substance use: Couple glasses alcohol Every 6-8 months No tobacco No caffeine No marijuana or other illicit drugs  Allergies:  No Known Allergies  Labs:  Results for orders placed or performed during the hospital encounter of 08/06/18 (from the past 48 hour(s))  Basic metabolic panel     Status: Abnormal   Collection Time: 08/14/18  3:36 AM  Result Value Ref Range   Sodium 132 (L) 135 - 145 mmol/L   Potassium 4.0 3.5 - 5.1 mmol/L   Chloride 96 (L) 98 - 111 mmol/L   CO2 22 22 - 32 mmol/L   Glucose, Bld 150 (H) 70 - 99 mg/dL   BUN 67 (H) 8 - 23 mg/dL   Creatinine, Ser 5.02 (H) 0.44 - 1.00 mg/dL   Calcium 8.0 (L) 8.9 - 10.3 mg/dL   GFR calc non Af Amer 9 (L) >60 mL/min   GFR calc Af Amer 10 (L) >60 mL/min   Anion gap 14 5 - 15    Comment: Performed at [facility], [address] [place], [place] [address]  Glucose, capillary     Status: Abnormal   Collection Time: 08/14/18  1:05 PM  Result Value Ref Range    Glucose-Capillary 185 (H) 70 - 99 mg/dL  CBC     Status: Abnormal   Collection Time: 08/14/18  3:43 PM  Result Value Ref Range   WBC 10.7 (H) 4.0 - 10.5 K/uL   RBC 2.51 (L) 3.87 - 5.11 MIL/uL   Hemoglobin 8.5 (L) 12.0 - 15.0 g/dL    Comment: POST TRANSFUSION SPECIMEN   HCT 25.5 (L) 36.0 - 46.0 %   MCV 101.6 (H) 80.0 - 100.0 fL   MCH 33.9 26.0 - 34.0 pg   MCHC 33.3 30.0 - 36.0 g/dL   RDW 23.7 (H) 11.5 - 15.5 %   Platelets 124 (L) 150 - 400 K/uL   nRBC 0.9 (H) 0.0 - 0.2 %    Comment: Performed at [facility], [address] [place], [place] [address]  CBC with Differential/Platelet     Status: Abnormal   Collection Time: 08/15/18  5:33 AM  Result Value Ref Range   WBC 11.9 (H) 4.0 - 10.5 K/uL   RBC 2.35 (L) 3.87 - 5.11 MIL/uL   Hemoglobin 8.0 (L) 12.0 - 15.0 g/dL   HCT 24.1 (L) 36.0 - 46.0 %   MCV 102.6 (H) 80.0 - 100.0 fL   MCH 34.0 26.0 - 34.0 pg   MCHC 33.2 30.0 - 36.0 g/dL   RDW 24.6 (H) 11.5 - 15.5 %   Platelets 91 (L) 150 - 400 K/uL    Comment: Immature Platelet Fraction may be clinically indicated, consider ordering this additional test [id number]    nRBC 1.3 (H) 0.0 - 0.2 %   Neutrophils Relative % 77 %   Neutro Abs 9.2 (H) 1.7 - 7.7 K/uL   Lymphocytes Relative 6 %   Lymphs Abs 0.7 0.7 - 4.0 K/uL   Monocytes Relative 12 %   Monocytes Absolute 1.4 (H) 0.1 - 1.0 K/uL   Eosinophils Relative 0 %   Eosinophils Absolute 0.0 0.0 - 0.5 K/uL   Basophils Relative 0 %   Basophils Absolute 0.0 0.0 - 0.1 K/uL   WBC Morphology TOXIC GRANULATION    Smear Review PLATELETS APPEAR DECREASED    Immature Granulocytes 5 %   Abs Immature Granulocytes 0.62 (H) 0.00 - 0.07 K/uL   Dimorphism PRESENT    Polychromasia PRESENT     Comment: Performed at [facility], [address] [place], [place] [address]  Comprehensive metabolic panel     Status: Abnormal   Collection Time: 08/15/18  9:26 AM  Result Value Ref Range   Sodium 136 135 - 145 mmol/L   Potassium 4.0  3.5 - 5.1 mmol/L   Chloride 97 (L) 98 - 111 mmol/L   CO2 21 (L) 22 - 32 mmol/L   Glucose, Bld 224 (H) 70 - 99 mg/dL   BUN 89 (H) 8 - 23 mg/dL   Creatinine, Ser 5.65 (H) 0.44 - 1.00 mg/dL   Calcium 8.2 (L) 8.9 - 10.3 mg/dL   Total Protein 6.2 (L) 6.5 - 8.1 g/dL   Albumin 3.3 (L) 3.5 - 5.0 g/dL   AST 38 15 - 41 U/L   ALT 19 0 - 44 U/L   Alkaline Phosphatase 61 38 - 126 U/L   Total Bilirubin 1.4 (H) 0.3 - 1.2 mg/dL   GFR calc non Af Amer 7 (L) >60 mL/min   GFR calc Af Amer 9 (L) >60 mL/min   Anion gap 18 (H) 5 - 15    Comment: Performed at [facility], [address] [place], [place] [address]    Current Facility-Administered Medications  Medication  Dose Route Frequency Provider Last Rate Last Dose  . 0.9 %  sodium chloride infusion (Manually program via Guardrails IV Fluids)   Intravenous Once Lavonia Dana, MD   Stopped at 08/13/18 1206  . 0.9 %  sodium chloride infusion  250 mL Intravenous PRN Seals, Theo Dills, NP      . acetaminophen (TYLENOL) tablet 650 mg  650 mg Oral Q4H PRN Seals, Theo Dills, NP   650 mg at 08/15/18 1516  . albuterol (PROVENTIL) (2.5 MG/3ML) 0.083% nebulizer solution 2.5 mg  2.5 mg Nebulization Q2H PRN Fritzi Mandes, MD      . atorvastatin (LIPITOR) tablet 40 mg  40 mg Oral q1800 Larey Dresser, MD   40 mg at 08/13/18 1947  . Chlorhexidine Gluconate Cloth 2 % PADS 6 each  6 each Topical Q0600 Lavonia Dana, MD   6 each at 08/15/18 0919  . clonazePAM (KLONOPIN) tablet 1 mg  1 mg Oral BID Lavella Hammock, MD   1 mg at 08/15/18 0912  . cyclobenzaprine (FLEXERIL) tablet 10 mg  10 mg Oral TID PRN Bradly Bienenstock, NP      . DULoxetine (CYMBALTA) DR capsule 30 mg  30 mg Oral Daily Fritzi Mandes, MD   30 mg at 08/15/18 0912  . epoetin alfa (EPOGEN) injection 10,000 Units  10,000 Units Intravenous Q T,Th,Sa-HD Lavonia Dana, MD   10,000 Units at 08/15/18 1508  . feeding supplement (NEPRO CARB STEADY) liquid 237 mL  237 mL Oral BID BM Fritzi Mandes, MD    Stopped at 08/14/18 1558  . ferrous sulfate tablet 325 mg  325 mg Oral Daily Darel Hong D, NP   325 mg at 08/15/18 0912  . fluticasone (FLONASE) 50 MCG/ACT nasal spray 2 spray  2 spray Each Nare PRN Bradly Bienenstock, NP   2 spray at 08/12/18 [phone]  . hydrALAZINE (APRESOLINE) injection 10-20 mg  10-20 mg Intravenous Q6H PRN Awilda Bill, NP   20 mg at 08/15/18 0301  . hydrALAZINE (APRESOLINE) tablet 25 mg  25 mg Oral TID Larey Dresser, MD   25 mg at 08/15/18 0900  . ipratropium-albuterol (DUONEB) 0.5-2.5 (3) MG/3ML nebulizer solution 3 mL  3 mL Nebulization Q6H Fritzi Mandes, MD   3 mL at 08/15/18 0914  . isosorbide mononitrate (IMDUR) 24 hr tablet 30 mg  30 mg Oral Daily Fritzi Mandes, MD   30 mg at 08/15/18 0912  . labetalol (NORMODYNE) injection 20 mg  20 mg Intravenous Q6H PRN Bradly Bienenstock, NP   10 mg at 08/14/18 1552  . levothyroxine (SYNTHROID) tablet 25 mcg  25 mcg Oral Q0600 Paticia Stack, [place]   25 mcg at 08/15/18 9833  . lidocaine (XYLOCAINE) 2 % viscous mouth solution 15 mL  15 mL Mouth/Throat PRN Darel Hong D, NP   15 mL at 08/07/18 1636  . methylPREDNISolone sodium succinate (SOLU-MEDROL) 125 mg/2 mL injection 60 mg  60 mg Intravenous Daily Fritzi Mandes, MD   60 mg at 08/15/18 0914  . metoprolol succinate (TOPROL-XL) 24 hr tablet 50 mg  50 mg Oral Daily Christell Faith M, PA-C   50 mg at 08/15/18 8250  . mometasone-formoterol (DULERA) 200-5 MCG/ACT inhaler 2 puff  2 puff Inhalation BID Darel Hong D, NP   2 puff at 08/15/18 0919  . multivitamin (RENA-VIT) tablet 1 tablet  1 tablet Oral QHS Fritzi Mandes, MD   1 tablet at 08/14/18 2129  . ondansetron (ZOFRAN) injection 4 mg  4  mg Intravenous Q6H PRN Stegmayer, Kimberly A, PA-C      . oxyCODONE (Oxy IR/ROXICODONE) immediate release tablet 5 mg  5 mg Oral Q6H PRN Mansy, Jan A, MD   5 mg at 08/15/18 0916  . Racepinephrine HCl 2.25 % nebulizer solution 0.5 mL  0.5 mL Nebulization Q4H PRN Fritzi Mandes, MD   0.5 mL at 08/14/18  1335  . sodium chloride flush (NS) 0.9 % injection 10 mL  10 mL Intravenous Q12H Fritzi Mandes, MD   10 mL at 08/15/18 0919  . sodium chloride flush (NS) 0.9 % injection 3 mL  3 mL Intravenous Q12H Seals, Angela H, NP   3 mL at 08/15/18 0919  . sodium chloride flush (NS) 0.9 % injection 3 mL  3 mL Intravenous PRN Seals, Levada Dy H, NP      . SUMAtriptan (IMITREX) tablet 100 mg  100 mg Oral TID PRN Fritzi Mandes, MD   100 mg at 08/14/18 0020  . zolpidem (AMBIEN) tablet 5 mg  5 mg Oral QHS PRN Tukov-Yual, Arlyss Gandy, NP       Facility-Administered Medications Ordered in Other Encounters  Medication Dose Route Frequency Provider Last Rate Last Dose  . 0.9 %  sodium chloride infusion   Intravenous Once Lloyd Huger, MD      . 0.9 %  sodium chloride infusion   Intravenous Once Lloyd Huger, MD      . dexamethasone (DECADRON) 20 mg in sodium chloride 0.9 % 50 mL IVPB  20 mg Intravenous Once Lloyd Huger, MD      . dexamethasone (DECADRON) injection 10 mg  10 mg Intravenous Once Lloyd Huger, MD      . sodium chloride flush (NS) 0.9 % injection 10 mL  10 mL Intravenous PRN Lloyd Huger, MD   10 mL at 06/16/17 2355    Musculoskeletal: Strength & Muscle Tone: within normal limits Gait & Station: not assessed Patient leans: N/A  Psychiatric Specialty Exam: Physical Exam  Nursing note and vitals reviewed. Constitutional: She is oriented to person, place, and time. She appears well-developed and well-nourished. No distress.  HENT:  Head: Normocephalic and atraumatic.  Eyes: EOM are normal.  Neck: Normal range of motion.  Cardiovascular: Normal rate and regular rhythm.  Respiratory: Effort normal. No respiratory distress.  Musculoskeletal: Normal range of motion.  Neurological: She is alert and oriented to person, place, and time.  Psychiatric: Her speech is normal and behavior is normal. Judgment and thought content normal. Her mood appears anxious. Cognition and  memory are normal.    Review of Systems  Constitutional: Positive for malaise/fatigue.  Respiratory: Negative.   Cardiovascular: Negative.   Musculoskeletal: Negative.   Neurological: Positive for weakness.  Psychiatric/Behavioral: Negative for hallucinations, memory loss, substance abuse and suicidal ideas. The patient is nervous/anxious.     Blood pressure 125/74, pulse 92, temperature 97.8 F (36.6 C), temperature source Oral, resp. rate 18, height 5\' 2"  (1.575 m), weight 74.6 kg, SpO2 99 %.Body mass index is 30.08 kg/m.  General Appearance: Casual   Eye Contact:  Fair  Speech:  Clear and Coherent and Normal Rate  Volume:  Normal  Mood:  Anxious, mild  Affect:  Congruent  Thought Process:  Coherent, Linear and Descriptions of Associations: Intact  Orientation:  Full (Time, Place, and Person)  Thought Content:  Logical and Hallucinations: None  Suicidal Thoughts:  No  Homicidal Thoughts:  No  Memory:  Immediate;   Good Recent;  Good Remote;   Good  Judgement:  Good  Insight:  Good  Psychomotor Activity:  Normal  Concentration:  Concentration: Good  Recall:  Good  Fund of Knowledge:  Good  Language:  Good  Akathisia:  No  Handed:  Right  AIMS (if indicated):     Assets:  Communication Skills Desire for Improvement Financial Resources/Insurance Housing Resilience Social Support  ADL's:  Intact  Cognition:  WNL  Sleep:   Slept well overnight     Treatment Plan Summary: Daily contact with patient to assess and evaluate symptoms and progress in treatment and Medication management Major depression, chronic and Anxiety: -Continue Cymbalta 60 mg daily as this appears to be effective in treating patient's depression. -Continue Klonopin 1 mg twice daily.   -Dr Dwyane Dee reviewed this client and concurs with the plan of care  Disposition: No evidence of imminent risk to self or others at present.   Patient does not meet criteria for psychiatric inpatient  admission. Supportive therapy provided about ongoing stressors.  Waylan Boga, NP 08/15/2018 3:36 PM

## 2018-08-15 NOTE — Progress Notes (Signed)
PT Cancellation Note  Patient Details Name: Jessica Burgess MRN: [id number] DOB: 05-23-1955   Cancelled Treatment:    Reason Eval/Treat Not Completed: Other (comment)(Pt off the floor at this time for HD. PT will follow up as able.)  Lieutenant Diego PT, DPT 2:29 PM,08/15/18 [phone]

## 2018-08-15 NOTE — Progress Notes (Signed)
Bipap on standby at this time. 

## 2018-08-15 NOTE — Progress Notes (Addendum)
Hematology/Oncology Progress Note [facility] [phone] [phone]  Patient Care Team: Steele Sizer, MD as PCP - General (Family Medicine) Lloyd Huger, MD as Consulting Physician (Oncology) Mellody Drown, MD as Consulting Physician (Obstetrics and Gynecology) Cathi Roan, [facility] (Pharmacist) Benedetto Goad, RN as Case Manager Clent Jacks, RN as Registered Nurse   Name of the patient: Jessica Burgess  [id number]  Oct 16, 1955  Date of visit: 08/15/18   INTERVAL HISTORY-  Patient is transferred out from ICU to 206. She reports feeling slightly better today.  Reports left neck pain around left IJ. Breathing is okay.  On nasal cannula oxygen 4 L.    Review of systems- Review of Systems  Constitutional: Positive for fatigue.  HENT:         Pain around the left IJ catheter site.  Respiratory: Positive for shortness of breath.   Cardiovascular: Negative for chest pain.  Gastrointestinal: Negative for abdominal distention.  Genitourinary: Negative for hematuria.   Musculoskeletal: Positive for neck pain.  Hematological: Negative for adenopathy.  Psychiatric/Behavioral: The patient is not nervous/anxious.     No Known Allergies  Patient Active Problem List   Diagnosis Date Noted   Acute respiratory failure ([place])    Anxiety 08/14/2018   ESRD on dialysis [facility])    Acute CHF (congestive heart failure) ([place]) 08/07/2018   Acute CHF ([place]) 08/06/2018   Hypertension due to drug 10/28/2017   Mucositis due to chemotherapy 09/02/2017   Goals of care, counseling/discussion 08/08/2017   Hypothyroidism due to medication 07/28/2017   Genetic testing 12/18/2016   Migraines 12/01/2016   Postoperative seroma of subcutaneous tissue after non-dermatologic procedure 12/01/2016   Transaminitis 12/01/2016   Anemia associated with acute blood loss 11/20/2016   Ovarian cancer, unspecified laterality ([place]) 11/12/2016    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[place]) 11/05/2016   Examination of participant in clinical trial 11/01/2016   Chronic right-sided low back pain with right-sided sciatica 02/02/2016   Status post insertion of dialysis catheter ([place]) 08/01/2015   Hematoma 06/14/2015   Perennial allergic rhinitis 05/05/2015   Generalized anxiety disorder 02/03/2015   Back pain, chronic 08/25/2014   Insomnia, persistent 08/25/2014   Chronic cervical pain 08/25/2014   Major depression, chronic ([place]) 08/25/2014   Dyslipidemia 08/25/2014   Gastro-esophageal reflux disease without esophagitis 08/25/2014   H/O high risk medication treatment 08/25/2014   Blood glucose elevated 08/25/2014   Migraine without aura and without status migrainosus, not intractable 08/25/2014   Climacteric 54/62/7035   Dysmetabolic syndrome 00/93/8182   Fungal infection of toenail 08/25/2014   Obesity (BMI 30.0-34.9) 08/25/2014   Vitamin D deficiency 08/25/2014   Engages in travel abroad 08/25/2014   Cervical disc disorder with radiculopathy 04/28/2013     Past Medical History:  Diagnosis Date   Allergic rhinitis, cause unspecified    Anxiety state, unspecified    Arthritis    Asthma    only when sick    Backache, unspecified    Bronchitis    hx of when get sick   Cancer [facility])    skin cancer , basal cell    Cancer ([place]) 11/2016   ovarian   Cervicalgia    Complication of anesthesia    Dermatophytosis of nail    Dysmetabolic syndrome X    Encounter for long-term (current) use of other medications    Esophageal reflux    Insomnia, unspecified    Leukocytosis, unspecified    Migraine without aura, without  mention of intractable migraine without mention of status migrainosus    Other and unspecified hyperlipidemia    Other malaise and fatigue    Overweight(278.02)    Personal history of chemotherapy now   ovarian   PONV (postoperative nausea and  vomiting)    Spinal stenosis in cervical region    Symptomatic menopausal or female climacteric states    Unspecified disorder of skin and subcutaneous tissue    Unspecified vitamin D deficiency      Past Surgical History:  Procedure Laterality Date   ABDOMINAL HYSTERECTOMY     ANTERIOR CERVICAL DECOMP/DISCECTOMY FUSION N/A 06/07/2015   Procedure: Cervical three - four and Cervical six- seven anterior cervical decompression with fusion interbody prosthesis plating and bonegraft;  Surgeon: Newman Pies, MD;  Location: [place] NEURO ORS;  Service: Neurosurgery;  Laterality: N/A;  C34 and C67 anterior cervical decompression with fusion interbody prosthesis plating and bonegraft   BACK SURGERY     x2 Lower    EVACUATION OF CERVICAL HEMATOMA N/A 06/14/2015   Procedure: EVACUATION OF CERVICAL HEMATOMA;  Surgeon: Newman Pies, MD;  Location: [place] NEURO ORS;  Service: Neurosurgery;  Laterality: N/A;   NECK SURGERY     x3   TONSILLECTOMY      Social History   Socioeconomic History   Marital status: Single    Spouse name: Not on file   Number of children: 0   Years of education: some college   Highest education level: 12th grade  Occupational History    Employer: DISABLED   Occupation: Disabled   Scientist, product/process development strain: Very hard   Food insecurity:    Worry: Never true    Inability: Never true   Transportation needs:    Medical: No    Non-medical: No  Tobacco Use   Smoking status: Former Smoker    Packs/day: 1.50    Years: 20.00    Pack years: 30.00    Types: Cigarettes    Start date: 03/19/1979    Last attempt to quit: 08/25/1999    Years since quitting: 18.9   Smokeless tobacco: Never Used   Tobacco comment: smoking cessation materials not required  Substance and Sexual Activity   Alcohol use: Not Currently    Alcohol/week: 0.0 standard drinks   Drug use: No   Sexual activity: Never  Lifestyle   Physical activity:    Days per  week: 0 days    Minutes per session: 0 min   Stress: Very much  Relationships   Social connections:    Talks on phone: Patient refused    Gets together: Patient refused    Attends religious service: Patient refused    Active member of club or organization: Patient refused    Attends meetings of clubs or organizations: Patient refused    Relationship status: Patient refused   Intimate partner violence:    Fear of current or ex partner: No    Emotionally abused: No    Physically abused: No    Forced sexual activity: No  Other Topics Concern   Not on file  Social History Narrative   Patient is single.    Patient lives with roommates.    Patient on disability    Patient has no children.    Patient has some college      Family History  Problem Relation Age of Onset   Depression Mother    Migraines Mother    Dementia Father    Diabetes Father  Hyperlipidemia Father    Hyperlipidemia Brother    Hyperlipidemia Brother    Breast cancer Paternal Aunt        17s     Current Facility-Administered Medications:    0.9 %  sodium chloride infusion (Manually program via Guardrails IV Fluids), , Intravenous, Once, Kolluru, Sarath, MD, Stopped at 08/13/18 1206   0.9 %  sodium chloride infusion, 250 mL, Intravenous, PRN, Seals, Theo Dills, NP   acetaminophen (TYLENOL) tablet 650 mg, 650 mg, Oral, Q4H PRN, Seals, Angela H, NP   albuterol (PROVENTIL) (2.5 MG/3ML) 0.083% nebulizer solution 2.5 mg, 2.5 mg, Nebulization, Q2H PRN, Fritzi Mandes, MD   atorvastatin (LIPITOR) tablet 40 mg, 40 mg, Oral, q1800, Larey Dresser, MD, 40 mg at 08/13/18 1947   Chlorhexidine Gluconate Cloth 2 % PADS 6 each, 6 each, Topical, Q0600, Kolluru, Lurena Nida, MD, 6 each at 08/15/18 0919   clonazePAM (KLONOPIN) tablet 1 mg, 1 mg, Oral, BID, Lavella Hammock, MD, 1 mg at 08/15/18 0912   cyclobenzaprine (FLEXERIL) tablet 10 mg, 10 mg, Oral, TID PRN, Bradly Bienenstock, NP   DULoxetine (CYMBALTA) DR  capsule 30 mg, 30 mg, Oral, Daily, Fritzi Mandes, MD, 30 mg at 08/15/18 7824   epoetin alfa (EPOGEN) injection 10,000 Units, 10,000 Units, Intravenous, Q T,Th,Sa-HD, Kolluru, Sarath, MD   feeding supplement (NEPRO CARB STEADY) liquid 237 mL, 237 mL, Oral, BID BM, Fritzi Mandes, MD, Stopped at 08/14/18 1558   ferrous sulfate tablet 325 mg, 325 mg, Oral, Daily, Darel Hong D, NP, 325 mg at 08/15/18 0912   fluticasone (FLONASE) 50 MCG/ACT nasal spray 2 spray, 2 spray, Each Nare, PRN, Bradly Bienenstock, NP, 2 spray at 08/12/18 2353   hydrALAZINE (APRESOLINE) injection 10-20 mg, 10-20 mg, Intravenous, Q6H PRN, Awilda Bill, NP, 20 mg at 08/15/18 0301   hydrALAZINE (APRESOLINE) tablet 25 mg, 25 mg, Oral, TID, Larey Dresser, MD, 25 mg at 08/15/18 0900   ipratropium-albuterol (DUONEB) 0.5-2.5 (3) MG/3ML nebulizer solution 3 mL, 3 mL, Nebulization, Q6H, Fritzi Mandes, MD, 3 mL at 08/15/18 0914   isosorbide mononitrate (IMDUR) 24 hr tablet 30 mg, 30 mg, Oral, Daily, Fritzi Mandes, MD, 30 mg at 08/15/18 0912   labetalol (NORMODYNE) injection 20 mg, 20 mg, Intravenous, Q6H PRN, Darel Hong D, NP, 10 mg at 08/14/18 1552   levothyroxine (SYNTHROID) tablet 25 mcg, 25 mcg, Oral, Q0600, Paticia Stack, RPH, 25 mcg at 08/15/18 0636   lidocaine (XYLOCAINE) 2 % viscous mouth solution 15 mL, 15 mL, Mouth/Throat, PRN, Darel Hong D, NP, 15 mL at 08/07/18 1636   methylPREDNISolone sodium succinate (SOLU-MEDROL) 125 mg/2 mL injection 60 mg, 60 mg, Intravenous, Daily, Fritzi Mandes, MD, 60 mg at 08/15/18 0914   metoprolol succinate (TOPROL-XL) 24 hr tablet 50 mg, 50 mg, Oral, Daily, Dunn, Ryan M, PA-C, 50 mg at 08/15/18 0912   mometasone-formoterol (DULERA) 200-5 MCG/ACT inhaler 2 puff, 2 puff, Inhalation, BID, Darel Hong D, NP, 2 puff at 08/15/18 0919   multivitamin (RENA-VIT) tablet 1 tablet, 1 tablet, Oral, QHS, Fritzi Mandes, MD, 1 tablet at 08/14/18 2129   ondansetron (ZOFRAN) injection 4  mg, 4 mg, Intravenous, Q6H PRN, Stegmayer, Janalyn Harder, PA-C   [DISCONTINUED] oxyCODONE-acetaminophen (PERCOCET/ROXICET) 5-325 MG per tablet 1 tablet, 1 tablet, Oral, Q6H PRN, 1 tablet at 08/10/18 2230 **AND** oxyCODONE (Oxy IR/ROXICODONE) immediate release tablet 5 mg, 5 mg, Oral, Q6H PRN, Mansy, Jan A, MD, 5 mg at 08/15/18 0916   Racepinephrine HCl 2.25 % nebulizer solution 0.5  mL, 0.5 mL, Nebulization, Q4H PRN, Fritzi Mandes, MD, 0.5 mL at 08/14/18 1335   sodium chloride flush (NS) 0.9 % injection 10 mL, 10 mL, Intravenous, Q12H, Fritzi Mandes, MD, 10 mL at 08/15/18 0919   sodium chloride flush (NS) 0.9 % injection 3 mL, 3 mL, Intravenous, Q12H, Seals, Angela H, NP, 3 mL at 08/15/18 0919   sodium chloride flush (NS) 0.9 % injection 3 mL, 3 mL, Intravenous, PRN, Seals, Theo Dills, NP   SUMAtriptan (IMITREX) tablet 100 mg, 100 mg, Oral, TID PRN, Fritzi Mandes, MD, 100 mg at 08/14/18 0020   zolpidem (AMBIEN) tablet 5 mg, 5 mg, Oral, QHS PRN, Tukov-Yual, Magdalene S, NP  Facility-Administered Medications Ordered in Other Encounters:    0.9 %  sodium chloride infusion, , Intravenous, Once, Finnegan, Kathlene November, MD   0.9 %  sodium chloride infusion, , Intravenous, Once, Grayland Ormond, Kathlene November, MD   dexamethasone (DECADRON) 20 mg in sodium chloride 0.9 % 50 mL IVPB, 20 mg, Intravenous, Once, Grayland Ormond, Kathlene November, MD   dexamethasone (DECADRON) injection 10 mg, 10 mg, Intravenous, Once, Finnegan, Kathlene November, MD   sodium chloride flush (NS) 0.9 % injection 10 mL, 10 mL, Intravenous, PRN, Lloyd Huger, MD, 10 mL at 06/16/17 0854   Physical exam:  Vitals:   08/15/18 0900 08/15/18 0914 08/15/18 1000 08/15/18 1214  BP: (!) 145/84  (!) 143/83 (!) 141/84  Pulse: (!) 106  (!) 106 93  Resp: 19  (!) 21   Temp:      TempSrc:      SpO2: 100% 99% 100% 98%  Weight:      Height:       Physical Exam  Constitutional: She is oriented to person, place, and time.  Ill-appearing, lying in bed, no acute  distress.  HENT:  Head: Normocephalic and atraumatic.  Eyes: Pupils are equal, round, and reactive to light.  Pulmonary/Chest: Effort normal.  Breathing comfortably via nasal cannula oxygen. Decreased breath sounds bilaterally.  Abdominal: Soft.  Neurological: She is alert and oriented to person, place, and time.  Skin:  Left IJ catheter  Psychiatric: Affect normal.       CMP Latest Ref Rng & Units 08/15/2018  Glucose 70 - 99 mg/dL 224(H)  BUN 8 - 23 mg/dL 89(H)  Creatinine 0.44 - 1.00 mg/dL 5.65(H)  Sodium 135 - 145 mmol/L 136  Potassium 3.5 - 5.1 mmol/L 4.0  Chloride 98 - 111 mmol/L 97(L)  CO2 22 - 32 mmol/L 21(L)  Calcium 8.9 - 10.3 mg/dL 8.2(L)  Total Protein 6.5 - 8.1 g/dL 6.2(L)  Total Bilirubin 0.3 - 1.2 mg/dL 1.4(H)  Alkaline Phos 38 - 126 U/L 61  AST 15 - 41 U/L 38  ALT 0 - 44 U/L 19   CBC Latest Ref Rng & Units 08/15/2018  WBC 4.0 - 10.5 K/uL 11.9(H)  Hemoglobin 12.0 - 15.0 g/dL 8.0(L)  Hematocrit 36.0 - 46.0 % 24.1(L)  Platelets 150 - 400 K/uL 91(L)   RADIOGRAPHIC STUDIES: I have personally reviewed the radiological images as listed and agreed with the findings in the report.   Dg Chest 2 View  Result Date: 08/12/2018 CLINICAL DATA:  Shortness of breath EXAM: CHEST - 2 VIEW COMPARISON:  08/09/2018 CT, chest x-ray 08/07/2018 FINDINGS: Right Port-A-Cath remains in place, unchanged. Cardiomegaly with vascular congestion. Patchy opacities in the left mid and lower lung could reflect asymmetric edema or infection. Small bilateral effusions. No acute bony abnormality. IMPRESSION: Cardiomegaly with vascular congestion. Left mid and lower  lung airspace opacity could reflect asymmetric edema or infection. Small effusions. Electronically Signed   By: Rolm Baptise M.D.   On: 08/12/2018 12:03   Ct Chest Wo Contrast  Result Date: 08/09/2018 CLINICAL DATA:  Hemoptysis, ovarian cancer EXAM: CT CHEST WITHOUT CONTRAST TECHNIQUE: Multidetector CT imaging of the chest was  performed following the standard protocol without IV contrast. COMPARISON:  PET-CT, 05/07/2018, CT chest, 02/02/2018 FINDINGS: Cardiovascular: Cardiomegaly. Trace pericardial effusion. Right chest port catheter. Mediastinum/Nodes: No enlarged mediastinal, hilar, or axillary lymph nodes. Thyroid gland, trachea, and esophagus demonstrate no significant findings. Lungs/Pleura: There is scattered bilateral ground-glass and clustered nodular pulmonary opacity, most conspicuous in the bilateral upper lobes and new compared to prior PET-CT dated 05/07/2018 (series 3, image 58). There is a new 3 mm subpleural pulmonary nodule of the left pulmonary apex (series 3, image 23) and a new 4 mm subpleural nodule of the right middle lobe (series 3, image 70). There is a thin-walled pneumatocele unchanged from prior examination. Small bilateral pleural effusions and associated atelectasis or consolidation. Upper Abdomen: No acute abnormality. Musculoskeletal: No chest wall mass or suspicious bone lesions identified. IMPRESSION: 1. There is scattered bilateral ground-glass and clustered nodular pulmonary opacity, most conspicuous in the bilateral upper lobes and new compared to prior PET-CT dated 05/07/2018 (series 3, image 58). Small bilateral pleural effusions and associated atelectasis or consolidation. Findings are consistent with atypical infection or inflammation. Drug toxicity is a significant differential consideration in the setting of malignancy. 2. There is a new 3 mm subpleural pulmonary nodule of the left pulmonary apex (series 3, image 23) and a new 4 mm subpleural nodule of the right middle lobe (series 3, image 70). These are nonspecific and may be infectious or inflammatory although concerning for metastatic disease in the setting of primary ovarian malignancy. Attention on follow-up. Electronically Signed   By: Eddie Candle M.D.   On: 08/09/2018 12:25   US Venous Img Lower Bilateral  Result Date:  08/07/2018 CLINICAL DATA:  Elevated D-dimer. History of ovarian cancer. Evaluate for DVT. EXAM: BILATERAL LOWER EXTREMITY VENOUS DOPPLER ULTRASOUND TECHNIQUE: Gray-scale sonography with graded compression, as well as color Doppler and duplex ultrasound were performed to evaluate the lower extremity deep venous systems from the level of the common femoral vein and including the common femoral, femoral, profunda femoral, popliteal and calf veins including the posterior tibial, peroneal and gastrocnemius veins when visible. The superficial great saphenous vein was also interrogated. Spectral Doppler was utilized to evaluate flow at rest and with distal augmentation maneuvers in the common femoral, femoral and popliteal veins. COMPARISON:  None. FINDINGS: RIGHT LOWER EXTREMITY Common Femoral Vein: No evidence of thrombus. Normal compressibility, respiratory phasicity and response to augmentation. Saphenofemoral Junction: No evidence of thrombus. Normal compressibility and flow on color Doppler imaging. Profunda Femoral Vein: No evidence of thrombus. Normal compressibility and flow on color Doppler imaging. Femoral Vein: No evidence of thrombus. Normal compressibility, respiratory phasicity and response to augmentation. Popliteal Vein: No evidence of thrombus. Normal compressibility, respiratory phasicity and response to augmentation. Calf Veins: No evidence of thrombus. Normal compressibility and flow on color Doppler imaging. Superficial Great Saphenous Vein: No evidence of thrombus. Normal compressibility. Venous Reflux:  None. Other Findings:  None. LEFT LOWER EXTREMITY Common Femoral Vein: No evidence of thrombus. Normal compressibility, respiratory phasicity and response to augmentation. Saphenofemoral Junction: No evidence of thrombus. Normal compressibility and flow on color Doppler imaging. Profunda Femoral Vein: No evidence of thrombus. Normal compressibility and flow on color Doppler  imaging. Femoral Vein: No  evidence of thrombus. Normal compressibility, respiratory phasicity and response to augmentation. Popliteal Vein: No evidence of thrombus. Normal compressibility, respiratory phasicity and response to augmentation. Calf Veins: No evidence of thrombus. Normal compressibility and flow on color Doppler imaging. Superficial Great Saphenous Vein: No evidence of thrombus. Normal compressibility. Venous Reflux:  None. Other Findings: Note made of an approximately 2.3 x 3.6 x 0.8 cm serpiginous fluid collection with the left popliteal fossa. IMPRESSION: 1. No evidence of DVT within either lower extremity. 2. Incidentally noted approximately 3.6 cm left-sided Baker's cyst. Electronically Signed   By: Sandi Mariscal M.D.   On: 08/07/2018 07:49   Dg Chest Port 1 View  Result Date: 08/15/2018 CLINICAL DATA:  Catheter placement EXAM: PORTABLE CHEST 1 VIEW COMPARISON:  08/13/2018, 08/12/2018, 08/07/2018 FINDINGS: Right-sided central venous port tip over the SVC. New left IJ central venous catheter tip over the SVC. No pneumothorax. Cardiomegaly with vascular congestion and bilateral perihilar hazy and ground-glass opacities suspect for edema. Small bilateral effusions. Left basilar consolidation. IMPRESSION: 1. Left IJ central venous catheter tip over the SVC. No pneumothorax. 2. Cardiomegaly with vascular congestion, moderate pulmonary edema and continued small pleural effusions. Left basilar consolidation without change. Electronically Signed   By: Donavan Foil M.D.   On: 08/15/2018 03:57   Dg Chest Port 1 View  Result Date: 08/13/2018 CLINICAL DATA:  Shortness of breath. EXAM: PORTABLE CHEST 1 VIEW COMPARISON:  Radiographs of Aug 12, 2018. FINDINGS: Stable cardiomegaly with central pulmonary vascular congestion. Right internal jugular Port-A-Cath is again noted. No pneumothorax is noted. Increased interstitial densities are noted throughout both lungs most consistent with pulmonary edema. Small bilateral pleural  effusions are noted. Bony thorax is unremarkable. IMPRESSION: Stable cardiomegaly with central pulmonary vascular congestion. Increased bilateral interstitial densities are noted consistent with worsening pulmonary edema. Small bilateral pleural effusions are noted. Electronically Signed   By: Marijo Conception M.D.   On: 08/13/2018 13:57   Dg Chest Port 1 View  Result Date: 08/07/2018 CLINICAL DATA:  Acute CHF EXAM: PORTABLE CHEST 1 VIEW COMPARISON:  Yesterday FINDINGS: Cardiomegaly and vascular pedicle widening. Continued perihilar predominant interstitial airspace opacity. No evidence of pleural effusion or pneumothorax. Port on the right with tip at the SVC. IMPRESSION: Continued CHF pattern. Electronically Signed   By: Monte Fantasia M.D.   On: 08/07/2018 06:15   Dg Chest Portable 1 View  Result Date: 08/06/2018 CLINICAL DATA:  Shortness of breath EXAM: PORTABLE CHEST 1 VIEW COMPARISON:  08/06/2018, 06/29/2018, PET-CT 05/07/2018 FINDINGS: Surgical hardware in the cervical spine. Right-sided central venous port tip over the SVC. Cardiomegaly with vascular congestion. Diffuse interstitial opacity, no significant change as compared with radiograph performed earlier today. No pleural effusion or pneumothorax. IMPRESSION: No significant interval change in cardiomegaly, mild vascular congestion and diffuse interstitial opacity since radiograph performed earlier today. Electronically Signed   By: Donavan Foil M.D.   On: 08/06/2018 23:40   Dg Chest Portable 1 View  Result Date: 08/06/2018 CLINICAL DATA:  63 year old female with respiratory distress, shortness of breath. Recent weight gain. Hypertensive on presentation. Pending COVID-19 status. History of ovarian cancer. EXAM: PORTABLE CHEST 1 VIEW COMPARISON:  06/29/2018 and earlier. FINDINGS: Portable AP semi upright view at 2001 hours. Stable right chest power port. Stable cardiac size and mediastinal contours. Lower lung volumes with ongoing diffuse  pulmonary interstitial opacity, basilar predominance. This is similar to but increased compared to April. No superimposed pneumothorax, pleural effusion or consolidation. Prior cervical ACDF.  No acute osseous abnormality identified. IMPRESSION: Lower lung volumes with ongoing diffuse pulmonary interstitial opacity, similar to but increased compared to April. Differential considerations include viral/atypical respiratory infection, pulmonary edema, less likely lymphangitic carcinomatosis. Electronically Signed   By: Genevie Ann M.D.   On: 08/06/2018 20:28   Dg Abd Portable 1v  Result Date: 08/10/2018 CLINICAL DATA:  Dialysis catheter insertion. EXAM: PORTABLE ABDOMEN - 1 VIEW COMPARISON:  Abdominal CT 08/06/2018. FINDINGS: Right femoral dialysis catheter extends to the L2-3 disc space level, corresponding with the infrarenal IVC on prior CT. The bowel gas pattern is normal. Patient is status post cholecystectomy and L4-5 fusion. IMPRESSION: Hemodialysis catheter tip at the L2-3 disc space level. Electronically Signed   By: Richardean Sale M.D.   On: 08/10/2018 15:35   Ct Renal Stone Study  Result Date: 08/06/2018 CLINICAL DATA:  History of ovarian cancer. Congestive heart failure. Decreasing GFR. Evaluate for renal obstruction. EXAM: CT ABDOMEN AND PELVIS WITHOUT CONTRAST TECHNIQUE: Multidetector CT imaging of the abdomen and pelvis was performed following the standard protocol without IV contrast. COMPARISON:  05/07/2018 PET-CT. 02/02/2018 CT abdomen and pelvis. FINDINGS: Lower chest: Interlobular septal thickening and hazy central ground-glass opacifications of the lung bases. Small bilateral pleural effusions. Mild cardiomegaly. Hepatobiliary: No focal liver abnormality is seen. Status post cholecystectomy. No biliary dilatation. Pancreas: Unremarkable. No pancreatic ductal dilatation or surrounding inflammatory changes. Spleen: Normal in size without focal abnormality. Adrenals/Urinary Tract: Adrenal glands  are unremarkable. Kidneys are normal, without renal calculi, focal lesion, or hydronephrosis. Bladder is unremarkable. Stomach/Bowel: Stomach is within normal limits. Appendix appears normal. No evidence of bowel wall thickening, distention, or inflammatory changes. Vascular/Lymphatic: Aortic atherosclerosis. No enlarged abdominal or pelvic lymph nodes. Reproductive: Status post hysterectomy. No adnexal masses. Other: Supraumbilical hernia containing fat is mildly increased in size. Stable postsurgical changes within the ventral abdominal wall. Nodularity of the left upper quadrant omentum is stable from prior PET-CT (series 2, image 33). Small volume of ascites. Mild edema within the subcutaneous soft tissues. Musculoskeletal: No fracture is seen. L4-5 posterior instrumented fusion and laminectomy chronic postsurgical changes. No apparent hardware related complication. IMPRESSION: 1. No hydronephrosis or urinary stone disease identified. Unremarkable non-contrast CT appearance of the kidneys. 2. Interstitial and alveolar pulmonary edema, small bilateral pleural effusions, small volume of ascites, mild edema within subcutaneous soft tissues. 3. Stable nodularity of the left upper quadrant omentum from prior PET-CT. Electronically Signed   By: Kristine Garbe M.D.   On: 08/06/2018 22:31    Assessment and plan-  Patient is a 63 y.o. female with history of recurrent ovarian cancer, currently admitted due to respiratory failure. #Acute respiratory failure, secondary to acute CHF exacerbation and volume overload. Requiring hemodialysis.  Transferred out of ICU today. Managed per primary team cardiology.  Acute renal failure, hemodialysis via left IJ catheter.  Planning permacath placement next week by vascular.  Anemia of chronic disease, hemoglobin stable.  Continue erythropoietin therapy with hemodialysis.  . Ovarian cancer, chemotherapy on hold due to above.  Follow-up outpatient with Dr.  Grayland Ormond. Thrombocytopenia, stable.  Platelet count 91,000 Thank you for allowing me to participate in the care of this patient.   Earlie Server, MD, PhD Hematology Oncology [facility] Pager- [phone] 08/15/2018

## 2018-08-15 NOTE — Procedures (Signed)
Hemodialysis Catheter Insertion Procedure Note CANDRA WEGNER [id number] 12-30-1955  Procedure: Insertion of Central Venous Catheter Indications: Hemodialysis  Procedure Details Consent: Risks of procedure as well as the alternatives and risks of each were explained to the (patient/caregiver).  Consent for procedure obtained. Time Out: Verified patient identification, verified procedure, site/side was marked, verified correct patient position, special equipment/implants available, medications/allergies/relevent history reviewed, required imaging and test results available.  Performed  Maximum sterile technique was used including antiseptics, cap, gloves, gown, hand hygiene, mask and sheet. Skin prep: Chlorhexidine; local anesthetic administered A antimicrobial bonded/coated triple lumen catheter was placed in the left internal jugular vein using the Seldinger technique.  Evaluation Blood flow good Complications: No apparent complications Patient did tolerate procedure well. Chest X-ray ordered to verify placement.  CXR: normal.  Procedure performed under direct supervision of Dr.Aleskerov Ultrasound utilized for realtime vessel cannulation Anica Alcaraz S. Tukov-Yual ANP-BC Pulmonary and [place] Pager [phone] or [phone]  NB: This document was prepared using Dragon voice recognition software and may include unintentional dictation errors.    Magddalene S Tukov-Yual 08/15/2018, 2:54 AM

## 2018-08-15 NOTE — Progress Notes (Signed)
CRITICAL CARE NOTE         SUBJECTIVE FINDINGS & SIGNIFICANT EVENTS   Patient has improved significantly post diuresis, she has dialysis scheduled for today.  I have discussed case with nephrologist Dr. Juleen China appreciate input plan is to downgrade to medical floor today.  PAST MEDICAL HISTORY   Past Medical History:  Diagnosis Date  . Allergic rhinitis, cause unspecified   . Anxiety state, unspecified   . Arthritis   . Asthma    only when sick   . Backache, unspecified   . Bronchitis    hx of when get sick  . Cancer ([place])    skin cancer , basal cell   . Cancer ([place]) 11/2016   ovarian  . Cervicalgia   . Complication of anesthesia   . Dermatophytosis of nail   . Dysmetabolic syndrome X   . Encounter for long-term (current) use of other medications   . Esophageal reflux   . Insomnia, unspecified   . Leukocytosis, unspecified   . Migraine without aura, without mention of intractable migraine without mention of status migrainosus   . Other and unspecified hyperlipidemia   . Other malaise and fatigue   . Overweight(278.02)   . Personal history of chemotherapy now   ovarian  . PONV (postoperative nausea and vomiting)   . Spinal stenosis in cervical region   . Symptomatic menopausal or female climacteric states   . Unspecified disorder of skin and subcutaneous tissue   . Unspecified vitamin D deficiency      SURGICAL HISTORY   Past Surgical History:  Procedure Laterality Date  . ABDOMINAL HYSTERECTOMY    . ANTERIOR CERVICAL DECOMP/DISCECTOMY FUSION N/A 06/07/2015   Procedure: Cervical three - four and Cervical six- seven anterior cervical decompression with fusion interbody prosthesis plating and bonegraft;  Surgeon: Newman Pies, MD;  Location: [facility];  Service: Neurosurgery;   Laterality: N/A;  C34 and C67 anterior cervical decompression with fusion interbody prosthesis plating and bonegraft  . BACK SURGERY     x2 Lower   . EVACUATION OF CERVICAL HEMATOMA N/A 06/14/2015   Procedure: EVACUATION OF CERVICAL HEMATOMA;  Surgeon: Newman Pies, MD;  Location: [place] [facility];  Service: Neurosurgery;  Laterality: N/A;  . NECK SURGERY     x3  . TONSILLECTOMY       FAMILY HISTORY   Family History  Problem Relation Age of Onset  . Depression Mother   . Migraines Mother   . Dementia Father   . Diabetes Father   . Hyperlipidemia Father   . Hyperlipidemia Brother   . Hyperlipidemia Brother   . Breast cancer Paternal Aunt        5s     SOCIAL HISTORY   Social History   Tobacco Use  . Smoking status: Former Smoker    Packs/day: 1.50    Years: 20.00    Pack years: 30.00    Types: Cigarettes    Start date: 03/19/1979    Last attempt to quit: 08/25/1999    Years since quitting: 18.9  . Smokeless tobacco: Never Used  . Tobacco comment: smoking cessation materials not required  Substance Use Topics  . Alcohol use: Not Currently    Alcohol/week: 0.0 standard drinks  . Drug use: No     MEDICATIONS   Current Medication:  Current Facility-Administered Medications:  .  0.9 %  sodium chloride infusion (Manually program via Guardrails IV Fluids), , Intravenous, Once, Lavonia Dana, MD, Stopped at 08/13/18 1206 .  0.9 %  sodium chloride infusion, 250 mL, Intravenous, PRN, Seals, Theo Dills, NP .  acetaminophen (TYLENOL) tablet 650 mg, 650 mg, Oral, Q4H PRN, Seals, Angela H, NP .  albuterol (PROVENTIL) (2.5 MG/3ML) 0.083% nebulizer solution 2.5 mg, 2.5 mg, Nebulization, Q2H PRN, Fritzi Mandes, MD .  atorvastatin (LIPITOR) tablet 40 mg, 40 mg, Oral, q1800, Larey Dresser, MD, 40 mg at 08/13/18 1947 .  Chlorhexidine Gluconate Cloth 2 % PADS 6 each, 6 each, Topical, Q0600, Kolluru, Sarath, MD .  clonazePAM (KLONOPIN) tablet 1 mg, 1 mg, Oral, BID, Lavella Hammock,  MD, 1 mg at 08/14/18 2129 .  cyclobenzaprine (FLEXERIL) tablet 10 mg, 10 mg, Oral, TID PRN, Bradly Bienenstock, NP .  DULoxetine (CYMBALTA) DR capsule 30 mg, 30 mg, Oral, Daily, Fritzi Mandes, MD .  feeding supplement (NEPRO CARB STEADY) liquid 237 mL, 237 mL, Oral, BID BM, Fritzi Mandes, MD, Stopped at 08/14/18 1558 .  ferrous sulfate tablet 325 mg, 325 mg, Oral, Daily, Darel Hong D, NP, 325 mg at 08/13/18 0916 .  fluticasone (FLONASE) 50 MCG/ACT nasal spray 2 spray, 2 spray, Each Nare, PRN, Bradly Bienenstock, NP, 2 spray at 08/12/18 [phone] .  furosemide (LASIX) 250 mg in dextrose 5 % 250 mL (1 mg/mL) infusion, 8 mg/hr, Intravenous, Continuous, Kolluru, Sarath, MD, Last Rate: 8 mL/hr at 08/15/18 0400, 8 mg/hr at 08/15/18 0400 .  hydrALAZINE (APRESOLINE) injection 10-20 mg, 10-20 mg, Intravenous, Q6H PRN, Awilda Bill, NP, 20 mg at 08/15/18 0301 .  hydrALAZINE (APRESOLINE) tablet 25 mg, 25 mg, Oral, TID, Larey Dresser, MD, 25 mg at 08/14/18 2007 .  ipratropium-albuterol (DUONEB) 0.5-2.5 (3) MG/3ML nebulizer solution 3 mL, 3 mL, Nebulization, Q6H, Fritzi Mandes, MD, 3 mL at 08/15/18 0109 .  isosorbide mononitrate (IMDUR) 24 hr tablet 30 mg, 30 mg, Oral, Daily, Fritzi Mandes, MD, Stopped at 08/14/18 1558 .  labetalol (NORMODYNE) injection 20 mg, 20 mg, Intravenous, Q6H PRN, Darel Hong D, NP, 10 mg at 08/14/18 1552 .  levothyroxine (SYNTHROID) tablet 25 mcg, 25 mcg, Oral, Q0600, Paticia Stack, RPH, 25 mcg at 08/15/18 8588 .  lidocaine (XYLOCAINE) 2 % viscous mouth solution 15 mL, 15 mL, Mouth/Throat, PRN, Darel Hong D, NP, 15 mL at 08/07/18 1636 .  methylPREDNISolone sodium succinate (SOLU-MEDROL) 125 mg/2 mL injection 60 mg, 60 mg, Intravenous, Daily, Fritzi Mandes, MD .  metoprolol succinate (TOPROL-XL) 24 hr tablet 50 mg, 50 mg, Oral, Daily, Dunn, Ryan M, PA-C, Stopped at 08/14/18 1039 .  mometasone-formoterol (DULERA) 200-5 MCG/ACT inhaler 2 puff, 2 puff, Inhalation, BID, Darel Hong D, NP, 2 puff at 08/14/18 2130 .  multivitamin (RENA-VIT) tablet 1 tablet, 1 tablet, Oral, QHS, Fritzi Mandes, MD, 1 tablet at 08/14/18 2129 .  ondansetron (ZOFRAN) injection 4 mg, 4 mg, Intravenous, Q6H PRN, Stegmayer, Kimberly A, PA-C .  [DISCONTINUED] oxyCODONE-acetaminophen (PERCOCET/ROXICET) 5-325 MG per tablet 1 tablet, 1 tablet, Oral, Q6H PRN, 1 tablet at 08/10/18 2230 **AND** oxyCODONE (Oxy IR/ROXICODONE) immediate release tablet 5 mg, 5 mg, Oral, Q6H PRN, Mansy, Jan A, MD, 5 mg at 08/14/18 1210 .  Racepinephrine HCl 2.25 % nebulizer solution 0.5 mL, 0.5 mL, Nebulization, Q4H PRN, Fritzi Mandes, MD, 0.5 mL at 08/14/18 1335 .  sodium chloride flush (NS) 0.9 % injection 10 mL, 10 mL, Intravenous, Q12H, Fritzi Mandes, MD, 10 mL at 08/14/18 2130 .  sodium chloride flush (NS) 0.9 % injection 3 mL, 3 mL, Intravenous, Q12H, Seals, Angela H, NP, 3 mL at 08/14/18  2130 .  sodium chloride flush (NS) 0.9 % injection 3 mL, 3 mL, Intravenous, PRN, Seals, Angela H, NP .  SUMAtriptan (IMITREX) tablet 100 mg, 100 mg, Oral, TID PRN, Fritzi Mandes, MD, 100 mg at 08/14/18 0020 .  zolpidem (AMBIEN) tablet 5 mg, 5 mg, Oral, QHS PRN, Tukov-Yual, Arlyss Gandy, NP  Facility-Administered Medications Ordered in Other Encounters:  .  0.9 %  sodium chloride infusion, , Intravenous, Once, Finnegan, Kathlene November, MD .  0.9 %  sodium chloride infusion, , Intravenous, Once, Grayland Ormond, Kathlene November, MD .  dexamethasone (DECADRON) 20 mg in sodium chloride 0.9 % 50 mL IVPB, 20 mg, Intravenous, Once, Grayland Ormond, Kathlene November, MD .  dexamethasone (DECADRON) injection 10 mg, 10 mg, Intravenous, Once, Finnegan, Kathlene November, MD .  sodium chloride flush (NS) 0.9 % injection 10 mL, 10 mL, Intravenous, PRN, Lloyd Huger, MD, 10 mL at 06/16/17 0854    ALLERGIES   Patient has no known allergies.    REVIEW OF SYSTEMS     10 point ROS is negative except as per subjective findings PHYSICAL EXAMINATION   Vitals:   08/15/18 0301  08/15/18 0400  BP: (!) 172/97 139/84  Pulse:  (!) 105  Resp:  17  Temp:    SpO2:  98%    GENERAL: Mild distress due to acutely ill state HEAD: Normocephalic, atraumatic.  EYES: Pupils equal, round, reactive to light.  No scleral icterus.  MOUTH: Moist mucosal membrane. NECK: Supple. No thyromegaly. No nodules. No JVD.  PULMONARY: Bilateral crackles CARDIOVASCULAR: S1 and S2. Regular rate and rhythm. No murmurs, rubs, or gallops.  GASTROINTESTINAL: Soft, nontender, non-distended. No masses. Positive bowel sounds. No hepatosplenomegaly.  MUSCULOSKELETAL: No swelling, clubbing, or edema.  NEUROLOGIC: Mild distress due to acute illness SKIN:intact,warm,dry   LABS AND IMAGING       LAB RESULTS: Recent Labs  Lab 08/12/18 0904 08/13/18 0526 08/14/18 0336  NA 135 136 132*  K 4.0 3.7 4.0  CL 98 97* 96*  CO2 27 28 22   BUN 47* 53* 67*  CREATININE 3.36* 4.02* 5.02*  GLUCOSE 168* 95 150*   Recent Labs  Lab 08/13/18 0526 08/14/18 1543 08/15/18 0533  HGB 6.5* 8.5* 8.0*  HCT 21.3* 25.5* 24.1*  WBC 7.5 10.7* 11.9*  PLT 94* 124* 91*     IMAGING RESULTS: Dg Chest Port 1 View  Result Date: 08/15/2018 CLINICAL DATA:  Catheter placement EXAM: PORTABLE CHEST 1 VIEW COMPARISON:  08/13/2018, 08/12/2018, 08/07/2018 FINDINGS: Right-sided central venous port tip over the SVC. New left IJ central venous catheter tip over the SVC. No pneumothorax. Cardiomegaly with vascular congestion and bilateral perihilar hazy and ground-glass opacities suspect for edema. Small bilateral effusions. Left basilar consolidation. IMPRESSION: 1. Left IJ central venous catheter tip over the SVC. No pneumothorax. 2. Cardiomegaly with vascular congestion, moderate pulmonary edema and continued small pleural effusions. Left basilar consolidation without change. Electronically Signed   By: Donavan Foil M.D.   On: 08/15/2018 03:57      ASSESSMENT AND PLAN    -Multidisciplinary rounds held today  Acute  Hypoxic Respiratory Failure -Due to flash pulmonary edema and ESRD -Status post Lasix drip Status post emergent HD Significantly improved will optimize for downgrade     Renal Failure-due to ESRD -Nephrology on case appreciate input Status post HD -follow chem 7 -follow UO -continue Foley Catheter-assess need daily    Accelerated hypertension -Required nitro drip -Improved now on home regimen -PRN hydralazine and labetalol iv    Severe  anemia -Due to ESRD Status post Epogen Status post blood transfusion H&H monitoring-improved    ID -continue IV abx as prescibed -follow up cultures  GI/Nutrition GI PROPHYLAXIS as indicated DIET-->TF's as tolerated Constipation protocol as indicated  ENDO - ICU hypoglycemic\Hyperglycemia protocol -check FSBS per protocol   ELECTROLYTES -follow labs as needed -replace as needed -pharmacy consultation   DVT/GI PRX ordered -SCDs  TRANSFUSIONS AS NEEDED MONITOR FSBS ASSESS the need for LABS as needed   Critical care provider statement:    Critical care time (minutes):  32   Critical care time was exclusive of:  Separately billable procedures and treating other patients   Critical care was necessary to treat or prevent imminent or life-threatening deterioration of the following conditions:   Acute hypoxemic respiratory failure, accelerated hypertension, florid pulmonary edema, severe anemia, multiple comorbid conditions   Critical care was time spent personally by me on the following activities:  Development of treatment plan with patient or surrogate, discussions with consultants, evaluation of patient's response to treatment, examination of patient, obtaining history from patient or surrogate, ordering and performing treatments and interventions, ordering and review of laboratory studies and re-evaluation of patient's condition.  I assumed direction of critical care for this patient from another provider in my specialty: no     This document was prepared using Dragon voice recognition software and may include unintentional dictation errors.    Ottie Glazier, M.D.  Division of [place]

## 2018-08-15 NOTE — Progress Notes (Signed)
[facility] [place] NAME: Jessica Burgess    MR#:  [id number]  DATE OF BIRTH:  February 09, 1956  SUBJECTIVE:  CHIEF COMPLAINT:   Chief Complaint  Patient presents with  . Shortness of Breath   -Left IJ temporary dialysis catheter placed.  Breathing is much better.  Right now on 4 L oxygen  REVIEW OF SYSTEMS:  Review of Systems  Constitutional: Positive for malaise/fatigue. Negative for chills and fever.  HENT: Negative for congestion, ear discharge, hearing loss and nosebleeds.   Eyes: Negative for blurred vision and double vision.  Respiratory: Negative for cough, shortness of breath and wheezing.   Cardiovascular: Negative for chest pain and palpitations.  Gastrointestinal: Negative for abdominal pain, constipation, diarrhea, nausea and vomiting.  Genitourinary: Negative for dysuria.  Musculoskeletal: Negative for myalgias.  Neurological: Negative for dizziness, focal weakness, seizures, weakness and headaches.  Psychiatric/Behavioral: Negative for depression.    DRUG ALLERGIES:  No Known Allergies  VITALS:  Blood pressure (!) 141/84, pulse 93, temperature 97.7 F (36.5 C), temperature source Oral, resp. rate (!) 21, height 5\' 2"  (1.575 m), weight 79.4 kg, SpO2 98 %.  PHYSICAL EXAMINATION:  Physical Exam   GENERAL:  63 y.o.-year-old patient lying in the bed with no acute distress.  EYES: Pupils equal, round, reactive to light and accommodation. No scleral icterus. Extraocular muscles intact.  HEENT: Head atraumatic, normocephalic. Oropharynx and nasopharynx clear.  NECK:  Supple, no jugular venous distention. No thyroid enlargement, no tenderness.  Left IJ catheter in place LUNGS: Normal breath sounds bilaterally, no wheezing, rales,rhonchi or crepitation. No use of accessory muscles of respiration.  Decreased bibasilar breath sounds CARDIOVASCULAR: S1, S2 normal. No murmurs, rubs, or gallops.  ABDOMEN: Soft, nontender,  nondistended. Bowel sounds present. No organomegaly or mass.  EXTREMITIES: No pedal edema, cyanosis, or clubbing.  NEUROLOGIC: Cranial nerves II through XII are intact. Muscle strength 5/5 in all extremities. Sensation intact. Gait not checked.  PSYCHIATRIC: The patient is alert and oriented x 3.  SKIN: No obvious rash, lesion, or ulcer.    LABORATORY PANEL:   CBC Recent Labs  Lab 08/15/18 0533  WBC 11.9*  HGB 8.0*  HCT 24.1*  PLT 91*   ------------------------------------------------------------------------------------------------------------------  Chemistries  Recent Labs  Lab 08/15/18 0926  NA 136  K 4.0  CL 97*  CO2 21*  GLUCOSE 224*  BUN 89*  CREATININE 5.65*  CALCIUM 8.2*  AST 38  ALT 19  ALKPHOS 61  BILITOT 1.4*   ------------------------------------------------------------------------------------------------------------------  Cardiac Enzymes No results for input(s): TROPONINI in the last 168 hours. ------------------------------------------------------------------------------------------------------------------  RADIOLOGY:  Dg Chest Port 1 View  Result Date: 08/15/2018 CLINICAL DATA:  Catheter placement EXAM: PORTABLE CHEST 1 VIEW COMPARISON:  08/13/2018, 08/12/2018, 08/07/2018 FINDINGS: Right-sided central venous port tip over the SVC. New left IJ central venous catheter tip over the SVC. No pneumothorax. Cardiomegaly with vascular congestion and bilateral perihilar hazy and ground-glass opacities suspect for edema. Small bilateral effusions. Left basilar consolidation. IMPRESSION: 1. Left IJ central venous catheter tip over the SVC. No pneumothorax. 2. Cardiomegaly with vascular congestion, moderate pulmonary edema and continued small pleural effusions. Left basilar consolidation without change. Electronically Signed   By: Donavan Foil M.D.   On: 08/15/2018 03:57   Dg Chest Port 1 View  Result Date: 08/13/2018 CLINICAL DATA:  Shortness of breath. EXAM:  PORTABLE CHEST 1 VIEW COMPARISON:  Radiographs of Aug 12, 2018. FINDINGS: Stable cardiomegaly with central pulmonary vascular congestion. Right internal  jugular Port-A-Cath is again noted. No pneumothorax is noted. Increased interstitial densities are noted throughout both lungs most consistent with pulmonary edema. Small bilateral pleural effusions are noted. Bony thorax is unremarkable. IMPRESSION: Stable cardiomegaly with central pulmonary vascular congestion. Increased bilateral interstitial densities are noted consistent with worsening pulmonary edema. Small bilateral pleural effusions are noted. Electronically Signed   By: Marijo Conception M.D.   On: 08/13/2018 13:57    EKG:   Orders placed or performed during the hospital encounter of 08/06/18  . ED EKG  . ED EKG  . 12 lead EKG  . 12 lead EKG  . EKG 12-Lead  . EKG 12-Lead  . EKG 12-Lead  . EKG 12-Lead  . EKG 12-Lead  . EKG 12-Lead    ASSESSMENT AND PLAN:   63 year old female with past medical history significant for ovarian cancer, GERD, migraine, CKD stage III, cervical spinal stenosis presented to hospital secondary to worsening shortness of breath.  1.  Acute hypoxic respiratory failure-not on home oxygen.  Secondary to CHF exacerbation and volume overload -Currently on Lasix drip.  Also requiring hemodialysis. -Echocardiogram with EF of 40 to 45% and increased LVH. -Continue strict input and output monitoring and oxygen support as tolerated.  Off BiPAP and currently on 4 L oxygen via nasal cannula. - transfer out of ICU today  2.  Acute renal failure on CKD stage III-creatinine at 5.65 today  - -concern for cardiorenal syndrome.  Baseline creatinine at 1.7 -Appreciate nephrology consult.  Currently on dialysis that was started on 08/10/2018. -Permacath placement on Monday by vascular  3.  Hypothyroidism-continue Synthroid  4.  Anemia of chronic disease-continue to monitor hemoglobin.  Received transfusion prior to  admission at the cancer center. -Also on E. Pridgen  5.  High-grade ovarian cancer-status post chemotherapy.  Appreciate oncology input  6.  Anxiety-appreciate psychiatry consult.  Continue Cymbalta and Klonopin for now.  7.  Hypertension- continue hydralazine, Imdur and metoprolol  8.  Acute on chronic COPD exacerbation-on IV steroids.  Continue inhalers and nebulizers and monitor.  9.  DVT prophylaxis-teds and SCDs only given anemia   Physical therapy has been consulted.  Patient is independent at baseline.  Lives at home with her sister    All the records are reviewed and case discussed with Care Management/Social Workerr. Management plans discussed with the patient, family and they are in agreement.  CODE STATUS: Full code  TOTAL TIME TAKING CARE OF THIS PATIENT: 38 minutes.   POSSIBLE D/C IN 2 DAYS, DEPENDING ON CLINICAL CONDITION.   Gladstone Lighter M.D on 08/15/2018 at 12:47 PM  Between 7am to 6pm - Pager - [phone]  After 6pm go to [email or web address] - password [email or web address] [facility]  Office  [phone]  CC: Primary care physician; Steele Sizer, MD

## 2018-08-15 NOTE — Progress Notes (Signed)
[facility]  ROUNDING NOTE   Subjective:   Hemodialysis treatment yesterday. UF of 2 liters. Patient continues to require 5L [place]  Patient had new LIJ temp HD catheter placed  Objective:  Vital signs in last 24 hours:  Temp:  [97 F (36.1 C)-98.2 F (36.8 C)] 97.7 F (36.5 C) (05/30 0800) Pulse Rate:  [78-105] 99 (05/30 0700) Resp:  [12-33] 15 (05/30 0700) BP: (120-185)/(81-158) 174/95 (05/30 0700) SpO2:  [96 %-100 %] 100 % (05/30 0700) Weight:  [78.4 kg-80 kg] 79.4 kg (05/30 0500)  Weight change: 1 kg Filed Weights   08/14/18 1830 08/14/18 1845 08/15/18 0500  Weight: 78.4 kg 78.4 kg 79.4 kg    Intake/Output: I/O last 3 completed shifts: In: 107.3 [I.V.:107.3] Out: 2925 [Urine:925; Other:2000]   Intake/Output this shift:  Total I/O In: -  Out: 275 [Urine:275]  Physical Exam: General: NAD, ill appearing  Head: Normocephalic, atraumatic. Moist oral mucosal membranes  Eyes: Anicteric, PERRL  Neck: Supple, trachea midline  Lungs:  Clear to auscultation, 3 L [place] O2  Heart: Regular rate and rhythm  Abdomen:  Soft, nontender,   Extremities:  no peripheral edema.  Neurologic: Nonfocal, moving all four extremities  Skin: No lesions  Access: Right femoral temp cath 5/25. LIJ temp HD catheter    Basic Metabolic Panel: Recent Labs  Lab 08/10/18 0539 08/10/18 1640 08/11/18 0407 08/12/18 0904 08/13/18 0526 08/14/18 0336  NA 134*  --  137 135 136 132*  K 4.4  --  4.4 4.0 3.7 4.0  CL 103  --  102 98 97* 96*  CO2 22  --  23 27 28 22   GLUCOSE 123*  --  96 168* 95 150*  BUN 90*  --  72* 47* 53* 67*  CREATININE 4.13*  --  3.46* 3.36* 4.02* 5.02*  CALCIUM 7.9*  --  8.1* 8.3* 7.9* 8.0*  PHOS  --  5.5*  --  3.9 4.4  --     Liver Function Tests: Recent Labs  Lab 08/12/18 0904 08/13/18 0526  ALBUMIN 3.1* 3.0*   No results for input(s): LIPASE, AMYLASE in the last 168 hours. No results for input(s): AMMONIA in the last 168 hours.  CBC: Recent Labs   Lab 08/13/18 0526 08/14/18 1543 08/15/18 0533  WBC 7.5 10.7* 11.9*  NEUTROABS  --   --  9.2*  HGB 6.5* 8.5* 8.0*  HCT 21.3* 25.5* 24.1*  MCV 113.9* 101.6* 102.6*  PLT 94* 124* 91*    Cardiac Enzymes: No results for input(s): CKTOTAL, CKMB, CKMBINDEX, TROPONINI in the last 168 hours.  BNP: Invalid input(s): POCBNP  CBG: Recent Labs  Lab 08/14/18 1305  GLUCAP 185*    Microbiology: Results for orders placed or performed during the hospital encounter of 08/06/18  SARS Coronavirus 2 (CEPHEID - Performed in [facility] hospital lab), Hosp Order     Status: None   Collection Time: 08/06/18  8:15 PM  Result Value Ref Range Status   SARS Coronavirus 2 NEGATIVE NEGATIVE Final    Comment: (NOTE) If result is NEGATIVE SARS-CoV-2 target nucleic acids are NOT DETECTED. The SARS-CoV-2 RNA is generally detectable in upper and lower  respiratory specimens during the acute phase of infection. The lowest  concentration of SARS-CoV-2 viral copies this assay can detect is 250  copies / mL. A negative result does not preclude SARS-CoV-2 infection  and should not be used as the sole basis for treatment or other  patient management decisions.  A negative result  may occur with  improper specimen collection / handling, submission of specimen other  than nasopharyngeal swab, presence of viral mutation(s) within the  areas targeted by this assay, and inadequate number of viral copies  (<250 copies / mL). A negative result must be combined with clinical  observations, patient history, and epidemiological information. If result is POSITIVE SARS-CoV-2 target nucleic acids are DETECTED. The SARS-CoV-2 RNA is generally detectable in upper and lower  respiratory specimens dur ing the acute phase of infection.  Positive  results are indicative of active infection with SARS-CoV-2.  Clinical  correlation with patient history and other diagnostic information is  necessary to determine patient  infection status.  Positive results do  not rule out bacterial infection or co-infection with other viruses. If result is PRESUMPTIVE POSTIVE SARS-CoV-2 nucleic acids MAY BE PRESENT.   A presumptive positive result was obtained on the submitted specimen  and confirmed on repeat testing.  While 2019 novel coronavirus  (SARS-CoV-2) nucleic acids may be present in the submitted sample  additional confirmatory testing may be necessary for epidemiological  and / or clinical management purposes  to differentiate between  SARS-CoV-2 and other Sarbecovirus currently known to infect humans.  If clinically indicated additional testing with an alternate test  methodology [phone]) is advised. The SARS-CoV-2 RNA is generally  detectable in upper and lower respiratory sp ecimens during the acute  phase of infection. The expected result is Negative. Fact Sheet for Patients:  StrictlyIdeas.no Fact Sheet for Healthcare Providers: BankingDealers.co.za This test is not yet approved or cleared by the [place] FDA and has been authorized for detection and/or diagnosis of SARS-CoV-2 by FDA under an Emergency Use Authorization (EUA).  This EUA will remain in effect (meaning this test can be used) for the duration of the COVID-19 declaration under Section 564(b)(1) of the Act, 21 U.S.C. section 360bbb-3(b)(1), unless the authorization is terminated or revoked sooner. Performed at [facility], [address] [place], [place] [address]   MRSA PCR Screening     Status: None   Collection Time: 08/07/18  2:38 AM  Result Value Ref Range Status   MRSA by PCR NEGATIVE NEGATIVE Final    Comment:        The GeneXpert MRSA Assay (FDA approved for NASAL specimens only), is one component of a comprehensive MRSA colonization surveillance program. It is not intended to diagnose MRSA infection nor to guide or monitor treatment for MRSA  infections. Performed at [facility] [address] [place], [place] [address]   CULTURE, BLOOD (ROUTINE X 2) w Reflex to ID Panel     Status: None   Collection Time: 08/07/18  3:40 AM  Result Value Ref Range Status   Specimen Description BLOOD LEFT ANTECUBITAL  Final   Special Requests   Final    BOTTLES DRAWN AEROBIC AND ANAEROBIC Blood Culture results may not be optimal due to an excessive volume of blood received in culture bottles   Culture   Final    NO GROWTH 5 DAYS Performed at [facility], [address] [place], [place] [address]    Report Status 08/12/2018 FINAL  Final  CULTURE, BLOOD (ROUTINE X 2) w Reflex to ID Panel     Status: None   Collection Time: 08/07/18  3:40 AM  Result Value Ref Range Status   Specimen Description BLOOD BLOOD LEFT HAND  Final   Special Requests   Final    BOTTLES DRAWN AEROBIC AND ANAEROBIC Blood Culture  results may not be optimal due to an excessive volume of blood received in culture bottles   Culture   Final    NO GROWTH 5 DAYS Performed at [facility], [address] [place], [place] [address]    Report Status 08/12/2018 FINAL  Final  Urine Culture     Status: None   Collection Time: 08/08/18  2:10 AM  Result Value Ref Range Status   Specimen Description   Final    URINE, RANDOM Performed at [facility], [address], [place], [place] [address]    Special Requests   Final    Normal Performed at [facility], [address], [place], [place] [address]    Culture   Final    NO GROWTH Performed at [facility], [place] [name], [place],  [address]    Report Status 08/09/2018 FINAL  Final    Coagulation Studies: No results for input(s): LABPROT, INR in the last 72 hours.  Urinalysis: No results for input(s): COLORURINE, LABSPEC, PHURINE, GLUCOSEU, HGBUR, BILIRUBINUR, KETONESUR, PROTEINUR, UROBILINOGEN, NITRITE, LEUKOCYTESUR in the last 72 hours.  Invalid  input(s): APPERANCEUR    Imaging: Dg Chest Port 1 View  Result Date: 08/15/2018 CLINICAL DATA:  Catheter placement EXAM: PORTABLE CHEST 1 VIEW COMPARISON:  08/13/2018, 08/12/2018, 08/07/2018 FINDINGS: Right-sided central venous port tip over the SVC. New left IJ central venous catheter tip over the SVC. No pneumothorax. Cardiomegaly with vascular congestion and bilateral perihilar hazy and ground-glass opacities suspect for edema. Small bilateral effusions. Left basilar consolidation. IMPRESSION: 1. Left IJ central venous catheter tip over the SVC. No pneumothorax. 2. Cardiomegaly with vascular congestion, moderate pulmonary edema and continued small pleural effusions. Left basilar consolidation without change. Electronically Signed   By: Donavan Foil M.D.   On: 08/15/2018 03:57   Dg Chest Port 1 View  Result Date: 08/13/2018 CLINICAL DATA:  Shortness of breath. EXAM: PORTABLE CHEST 1 VIEW COMPARISON:  Radiographs of Aug 12, 2018. FINDINGS: Stable cardiomegaly with central pulmonary vascular congestion. Right internal jugular Port-A-Cath is again noted. No pneumothorax is noted. Increased interstitial densities are noted throughout both lungs most consistent with pulmonary edema. Small bilateral pleural effusions are noted. Bony thorax is unremarkable. IMPRESSION: Stable cardiomegaly with central pulmonary vascular congestion. Increased bilateral interstitial densities are noted consistent with worsening pulmonary edema. Small bilateral pleural effusions are noted. Electronically Signed   By: Marijo Conception M.D.   On: 08/13/2018 13:57     Medications:   . sodium chloride    . furosemide (LASIX) infusion 8 mg/hr (08/15/18 0400)   . sodium chloride   Intravenous Once  . atorvastatin  40 mg Oral q1800  . Chlorhexidine Gluconate Cloth  6 each Topical Q0600  . clonazePAM  1 mg Oral BID  . DULoxetine  30 mg Oral Daily  . feeding supplement (NEPRO CARB STEADY)  237 mL Oral BID BM  . ferrous  sulfate  325 mg Oral Daily  . hydrALAZINE  25 mg Oral TID  . ipratropium-albuterol  3 mL Nebulization Q6H  . isosorbide mononitrate  30 mg Oral Daily  . levothyroxine  25 mcg Oral Q0600  . methylPREDNISolone (SOLU-MEDROL) injection  60 mg Intravenous Daily  . metoprolol succinate  50 mg Oral Daily  . mometasone-formoterol  2 puff Inhalation BID  . multivitamin  1 tablet Oral QHS  . sodium chloride flush  10 mL Intravenous Q12H  . sodium chloride flush  3 mL Intravenous Q12H  sodium chloride, acetaminophen, albuterol, cyclobenzaprine, fluticasone, hydrALAZINE, labetalol, lidocaine, ondansetron (ZOFRAN) IV, [DISCONTINUED] oxyCODONE-acetaminophen **AND** oxyCODONE, Racepinephrine HCl, sodium chloride flush, SUMAtriptan, zolpidem  Assessment/ Plan:  Jessica Burgess is a 63 y.o. white female with history ofovarian cancer stage III,GERD, migraine headaches, cervical spinal stenosis,who was [facility] on[facility]2020for evaluation of significant shortness of breath.   1. Acute renal failure on chronic kidney disease stage III with proteinuria baseline creatinine 1.7, GFR of 30 on 07/27/18.   Chronic kidney disease cause seems unclear.  Acute renal failure is thought to be secondary to acute cardiorenal syndrome.  Serologic work up with ANA positive but reflex antibodies nonreactive.  First treatment on 5/25.  Dialysis treatment last two days.  Tunneled catheter for Monday. Appreciate Vascular input.  - Dialysis for later today. Orders prepared.   2. Hypertension: blood pressure are elevated Echo with systolic function slightly decreased, 45-50% EF With anxiety.  On furosemide gtt overnight. Will discontinue Metoprolol, hydralazine and isosorbide mononitrate  3. Anemia with acute renal failure: macrocytic. PRBC transfusion 5/28. Hemoglobin 8 EPO with HD treatment. Dr. Grayland Ormond cleared use of EPO.   4. Acute respiratory failure: secondary to acute exacerbation of congestive  heart failure and volume overload Improved with ultrafiltration with hemodialysis.    LOS: [place] 5/30/20209:36 AM

## 2018-08-16 LAB — CBC
HCT: 23.4 % — ABNORMAL LOW (ref 36.0–46.0)
Hemoglobin: 7.5 g/dL — ABNORMAL LOW (ref 12.0–15.0)
MCH: 33.6 pg (ref 26.0–34.0)
MCHC: 32.1 g/dL (ref 30.0–36.0)
MCV: 104.9 fL — ABNORMAL HIGH (ref 80.0–100.0)
Platelets: 68 10*3/uL — ABNORMAL LOW (ref 150–400)
RBC: 2.23 MIL/uL — ABNORMAL LOW (ref 3.87–5.11)
RDW: 24.3 % — ABNORMAL HIGH (ref 11.5–15.5)
WBC: 13.1 10*3/uL — ABNORMAL HIGH (ref 4.0–10.5)
nRBC: 2.8 % — ABNORMAL HIGH (ref 0.0–0.2)

## 2018-08-16 LAB — BASIC METABOLIC PANEL
Anion gap: 13 (ref 5–15)
BUN: 59 mg/dL — ABNORMAL HIGH (ref 8–23)
CO2: 26 mmol/L (ref 22–32)
Calcium: 7.8 mg/dL — ABNORMAL LOW (ref 8.9–10.3)
Chloride: 98 mmol/L (ref 98–111)
Creatinine, Ser: 3.89 mg/dL — ABNORMAL HIGH (ref 0.44–1.00)
GFR calc Af Amer: 13 mL/min — ABNORMAL LOW (ref >60)
GFR calc non Af Amer: 12 mL/min — ABNORMAL LOW (ref >60)
Glucose, Bld: 106 mg/dL — ABNORMAL HIGH (ref 70–99)
Potassium: 4 mmol/L (ref 3.5–5.1)
Sodium: 137 mmol/L (ref 135–145)

## 2018-08-16 MED ORDER — DIPHENHYDRAMINE HCL 50 MG/ML IJ SOLN: 50.0000 mg | Freq: Once | INTRAMUSCULAR | Status: DC | PRN

## 2018-08-16 MED ORDER — CEFAZOLIN SODIUM-DEXTROSE 1-4 GM/50ML-% IV SOLN
1.0000 g | Freq: Once | INTRAVENOUS | Status: DC
  Filled 2018-08-16: qty 50

## 2018-08-16 MED ORDER — METHYLPREDNISOLONE SODIUM SUCC 125 MG IJ SOLR: 125.0000 mg | Freq: Once | INTRAMUSCULAR | Status: DC | PRN

## 2018-08-16 MED ORDER — MIDAZOLAM HCL 2 MG/ML PO SYRP: 8.0000 mg | ORAL_SOLUTION | Freq: Once | ORAL | Status: DC | PRN

## 2018-08-16 MED ORDER — FAMOTIDINE 20 MG PO TABS: 40.0000 mg | ORAL_TABLET | Freq: Once | ORAL | Status: DC | PRN

## 2018-08-16 MED ORDER — SODIUM CHLORIDE 0.9 % IV SOLN
INTRAVENOUS | Status: DC
  Administered 2018-08-17: 06:00:00 via INTRAVENOUS

## 2018-08-16 MED ORDER — CEFAZOLIN SODIUM-DEXTROSE 1-4 GM/50ML-% IV SOLN
1.0000 g | INTRAVENOUS | Status: AC
  Administered 2018-08-17: 1 g via INTRAVENOUS
  Filled 2018-08-16: qty 50

## 2018-08-16 NOTE — Consult Note (Signed)
[facility] Face-to-Face Psychiatry Consult follow-up  Reason for Consult:  Anxiety Referring Physician:  Dr. Posey Pronto Patient Identification: Jessica Burgess MRN:  [id number] Principal Diagnosis: ESRD on dialysis [facility]) Diagnosis:  Principal Problem:   ESRD on dialysis [facility]) Active Problems:   Anxiety   Insomnia, persistent   Major depression, chronic ([place])   Generalized anxiety disorder   Status post insertion of dialysis catheter ([place])   Acute CHF ([place])   Acute CHF (congestive heart failure) ([place])   Acute respiratory failure ([place])  Patient is seen, chart is reviewed, collateral and communication with nursing regarding patient having increased anxiety in the morning.  Total Time spent with patient: 20 minutes  Subjective:  Her anxiety is "better", denies depression.  HPI:  Jessica Burgess is a 63 y.o. female patient with a known history of ovarian cancer, CKD, hypothyroidism, hypertension and anemia.  Patient is currently undergoing chemotherapy followed by Dr. Mellody Drown and Dr. Grayland Ormond.  Patient reports having had chemotherapy infusion on yesterday however over the last 3 days she has received infusion of packed red blood cells as well as 2 L IV fluids according to the patient report.  She now presents to the emergency room with a 2-day history of increased shortness of breath and nonproductive cough.  Shortness of breath is made worse when lying flat.  She denies edema.  She denies chest pain.  She denies abdominal pain.  Patient denies fevers, chills, nausea, vomiting, or diarrhea.  Patient denies a prior known history of CHF.  However she is aware of renal failure occurring since March 2020.  CT abdomen was completed which demonstrated no evidence of hydronephrosis or urinary stone disease.  Chest x-ray demonstrated no significant interval change in cardiomegaly with mild vascular congestion and diffuse interstitial opacity.  No pleural effusion or pneumothorax identified.  However,  patient developed increased shortness of breath while in the emergency room and was therefore placed on BiPAP therapy to decrease work of breathing.  Psychiatry consult is requested for evaluation and assistance with management of anxiety.  08/11/2018: On evaluation, patient is calm and cooperative.  She is alert and oriented x4.  Patient describes her mood as stable, with ups and downs related to her cancer diagnosis in 2018.  She expresses that she has developed congestive heart failure and recently renal failure, "secondary to bad judgment.  I should have gotten the blood transfusion, but should not have gotten the extra fluids that worsened my heart failure."  Patient expresses usual level of anxiety while hospitalized.  She states that she has more anxiety at home.  Patient lives with a roommate, and she describes increasing anxiety about 1 hour before when room-mate comes home because her room-mate complains about work not getting done at the house.  Patient reports that she is "happy" in the morning and through the day.  She states she falls asleep easily with Restoril, but it  wears off at 3 AM.  Patient denies SI, HI, AVH.  She denies history of mania or psychosis.  08/12/2018: On evaluation, patient is laying in bed with migraine pain.  She reports that Klonopin that she received this morning was helpful in managing her anxiety, however she feels sedated and dizzy following the 1 mg dose.  She reports that she slept well last night.  Review of records note that she did not require additional Restoril.  Patient is agreeable to low-dose Klonopin twice daily to manage chronic anxiety related to her multiple medical comorbidities.  Patient expresses nausea today with migraine, however reports her appetite is unchanged.  She is denying SI, HI, AVH.  08/14/18: On evaluation, patient is lying in bed with her CPAP machine, resting quietly with her eyes closed prior to assessment.  Her sitter, a Therapist, sports, who has  been with her reports her anxiety has definitely improved with better toleration of dialysis.  She was concerned about the patient being "more sleepy".  When this provider suggested decreasing the dose, the patient said she "liked it".  Reports her anxiety is much better and prefers the current dose, no changes made.  08/15/18: Patient lying in bed with CPAP in place, some anxiety but reports it is improved with the Klonopin.  Denies any other issues. Sleep was "good" with no nausea or headache today.    08/15/18: Patient sitting up in the recliner calmly eating ice watching television.  Reports her anxiety is "better", denies depression.  PICC line in place on left side of her neck but no breathing machine.  Mental and physical states have significantly improved. Smiles and thanks this provider when this is pointed out.    Past Psychiatric History: Depression, anxiety, insomnia  Risk to Self:  no Risk to Others:  no Prior Inpatient Therapy:  none Prior Outpatient Therapy:  none  Past Medical History:  Past Medical History:  Diagnosis Date  . Allergic rhinitis, cause unspecified   . Anxiety state, unspecified   . Arthritis   . Asthma    only when sick   . Backache, unspecified   . Bronchitis    hx of when get sick  . Cancer ([place])    skin cancer , basal cell   . Cancer ([place]) 11/2016   ovarian  . Cervicalgia   . Complication of anesthesia   . Dermatophytosis of nail   . Dysmetabolic syndrome X   . Encounter for long-term (current) use of other medications   . Esophageal reflux   . Insomnia, unspecified   . Leukocytosis, unspecified   . Migraine without aura, without mention of intractable migraine without mention of status migrainosus   . Other and unspecified hyperlipidemia   . Other malaise and fatigue   . Overweight(278.02)   . Personal history of chemotherapy now   ovarian  . PONV (postoperative nausea and vomiting)   . Spinal stenosis in cervical region   . Symptomatic  menopausal or female climacteric states   . Unspecified disorder of skin and subcutaneous tissue   . Unspecified vitamin D deficiency     Past Surgical History:  Procedure Laterality Date  . ABDOMINAL HYSTERECTOMY    . ANTERIOR CERVICAL DECOMP/DISCECTOMY FUSION N/A 06/07/2015   Procedure: Cervical three - four and Cervical six- seven anterior cervical decompression with fusion interbody prosthesis plating and bonegraft;  Surgeon: Newman Pies, MD;  Location: [place] NEURO ORS;  Service: Neurosurgery;  Laterality: N/A;  C34 and C67 anterior cervical decompression with fusion interbody prosthesis plating and bonegraft  . BACK SURGERY     x2 Lower   . EVACUATION OF CERVICAL HEMATOMA N/A 06/14/2015   Procedure: EVACUATION OF CERVICAL HEMATOMA;  Surgeon: Newman Pies, MD;  Location: [place] [facility]UR[facility] ORS;  Service: Neurosurgery;  Laterality: N/A;  . NECK SURGERY     x3  . TONSILLECTOMY     Family History:  Family History  Problem Relation Age of Onset  . Depression Mother   . Migraines Mother   . Dementia Father   . Diabetes Father   . Hyperlipidemia Father   .  Hyperlipidemia Brother   . Hyperlipidemia Brother   . Breast cancer Paternal Aunt        40s   Family Psychiatric  History: "a lot on my mother's side - depression" No suicides   Social History:  Social History   Substance and Sexual Activity  Alcohol Use Not Currently  . Alcohol/week: 0.0 standard drinks     Social History   Substance and Sexual Activity  Drug Use No    Social History   Socioeconomic History  . Marital status: Single    Spouse name: Not on file  . Number of children: 0  . Years of education: some college  . Highest education level: 12th grade  Occupational History    Employer: DISABLED  . Occupation: Disabled   Social Needs  . Financial resource strain: Very hard  . Food insecurity:    Worry: Never true    Inability: Never true  . Transportation needs:    Medical: No    Non-medical: No   Tobacco Use  . Smoking status: Former Smoker    Packs/day: 1.50    Years: 20.00    Pack years: 30.00    Types: Cigarettes    Start date: 03/19/1979    Last attempt to quit: 08/25/1999    Years since quitting: 18.9  . Smokeless tobacco: Never Used  . Tobacco comment: smoking cessation materials not required  Substance and Sexual Activity  . Alcohol use: Not Currently    Alcohol/week: 0.0 standard drinks  . Drug use: No  . Sexual activity: Never  Lifestyle  . Physical activity:    Days per week: 0 days    Minutes per session: 0 min  . Stress: Very much  Relationships  . Social connections:    Talks on phone: Patient refused    Gets together: Patient refused    Attends religious service: Patient refused    Active member of club or organization: Patient refused    Attends meetings of clubs or organizations: Patient refused    Relationship status: Patient refused  Other Topics Concern  . Not on file  Social History Narrative   Patient is single.    Patient lives with roommates.    Patient on disability    Patient has no children.    Patient has some college    Additional Social History:   Living with a room-mate Independent with her care. 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 disability  Substance use: Couple glasses alcohol Every 6-8 months No tobacco No caffeine No marijuana or other illicit drugs  Allergies:  No Known Allergies  Labs:  Results for orders placed or performed during the hospital encounter of 08/06/18 (from the past 48 hour(s))  CBC     Status: Abnormal   Collection Time: 08/14/18  3:43 PM  Result Value Ref Range   WBC 10.7 (H) 4.0 - 10.5 K/uL   RBC 2.51 (L) 3.87 - 5.11 MIL/uL   Hemoglobin 8.5 (L) 12.0 - 15.0 g/dL    Comment: POST TRANSFUSION SPECIMEN   HCT 25.5 (L) 36.0 - 46.0 %   MCV 101.6 (H) 80.0 - 100.0 fL   MCH 33.9 26.0 - 34.0 pg   MCHC 33.3 30.0 - 36.0 g/dL   RDW 23.7  (H) 11.5 - 15.5 %   Platelets 124 (L) 150 - 400 K/uL   nRBC 0.9 (H) 0.0 - 0.2 %  Comment: Performed at [facility], [address] [place], [place] [address]  CBC with Differential/Platelet     Status: Abnormal   Collection Time: 08/15/18  5:33 AM  Result Value Ref Range   WBC 11.9 (H) 4.0 - 10.5 K/uL   RBC 2.35 (L) 3.87 - 5.11 MIL/uL   Hemoglobin 8.0 (L) 12.0 - 15.0 g/dL   HCT 24.1 (L) 36.0 - 46.0 %   MCV 102.6 (H) 80.0 - 100.0 fL   MCH 34.0 26.0 - 34.0 pg   MCHC 33.2 30.0 - 36.0 g/dL   RDW 24.6 (H) 11.5 - 15.5 %   Platelets 91 (L) 150 - 400 K/uL    Comment: Immature Platelet Fraction may be clinically indicated, consider ordering this additional test [id number]    nRBC 1.3 (H) 0.0 - 0.2 %   Neutrophils Relative % 77 %   Neutro Abs 9.2 (H) 1.7 - 7.7 K/uL   Lymphocytes Relative 6 %   Lymphs Abs 0.7 0.7 - 4.0 K/uL   Monocytes Relative 12 %   Monocytes Absolute 1.4 (H) 0.1 - 1.0 K/uL   Eosinophils Relative 0 %   Eosinophils Absolute 0.0 0.0 - 0.5 K/uL   Basophils Relative 0 %   Basophils Absolute 0.0 0.0 - 0.1 K/uL   WBC Morphology TOXIC GRANULATION    Smear Review PLATELETS APPEAR DECREASED    Immature Granulocytes 5 %   Abs Immature Granulocytes 0.62 (H) 0.00 - 0.07 K/uL   Dimorphism PRESENT    Polychromasia PRESENT     Comment: Performed at [facility], [address] [place], [place] [address]  Comprehensive metabolic panel     Status: Abnormal   Collection Time: 08/15/18  9:26 AM  Result Value Ref Range   Sodium 136 135 - 145 mmol/L   Potassium 4.0 3.5 - 5.1 mmol/L   Chloride 97 (L) 98 - 111 mmol/L   CO2 21 (L) 22 - 32 mmol/L   Glucose, Bld 224 (H) 70 - 99 mg/dL   BUN 89 (H) 8 - 23 mg/dL   Creatinine, Ser 5.65 (H) 0.44 - 1.00 mg/dL   Calcium 8.2 (L) 8.9 - 10.3 mg/dL   Total Protein 6.2 (L) 6.5 - 8.1 g/dL   Albumin 3.3 (L) 3.5 - 5.0 g/dL   AST 38 15 - 41 U/L   ALT 19 0 - 44 U/L   Alkaline Phosphatase 61 38 - 126 U/L   Total Bilirubin  1.4 (H) 0.3 - 1.2 mg/dL   GFR calc non Af Amer 7 (L) >60 mL/min   GFR calc Af Amer 9 (L) >60 mL/min   Anion gap 18 (H) 5 - 15    Comment: Performed at [facility], [address] [place], [place] [address]  CBC     Status: Abnormal   Collection Time: 08/16/18  4:22 AM  Result Value Ref Range   WBC 13.1 (H) 4.0 - 10.5 K/uL   RBC 2.23 (L) 3.87 - 5.11 MIL/uL   Hemoglobin 7.5 (L) 12.0 - 15.0 g/dL   HCT 23.4 (L) 36.0 - 46.0 %   MCV 104.9 (H) 80.0 - 100.0 fL   MCH 33.6 26.0 - 34.0 pg   MCHC 32.1 30.0 - 36.0 g/dL   RDW 24.3 (H) 11.5 - 15.5 %   Platelets 68 (L) 150 - 400 K/uL    Comment: Immature Platelet Fraction may be clinically indicated, consider ordering this additional test [id number]    nRBC 2.8 (H) 0.0 - 0.2 %  Comment: Performed at [facility], [address] [place], [place] [address]  Basic metabolic panel     Status: Abnormal   Collection Time: 08/16/18  4:22 AM  Result Value Ref Range   Sodium 137 135 - 145 mmol/L   Potassium 4.0 3.5 - 5.1 mmol/L   Chloride 98 98 - 111 mmol/L   CO2 26 22 - 32 mmol/L   Glucose, Bld 106 (H) 70 - 99 mg/dL   BUN 59 (H) 8 - 23 mg/dL   Creatinine, Ser 3.89 (H) 0.44 - 1.00 mg/dL   Calcium 7.8 (L) 8.9 - 10.3 mg/dL   GFR calc non Af Amer 12 (L) >60 mL/min   GFR calc Af Amer 13 (L) >60 mL/min   Anion gap 13 5 - 15    Comment: Performed at [facility], [address] [place], [place] [address]    Current Facility-Administered Medications  Medication Dose Route Frequency Provider Last Rate Last Dose  . 0.9 %  sodium chloride infusion (Manually program via Guardrails IV Fluids)   Intravenous Once Lavonia Dana, MD   Stopped at 08/13/18 1206  . 0.9 %  sodium chloride infusion  250 mL Intravenous PRN Seals, Theo Dills, NP      . acetaminophen (TYLENOL) tablet 650 mg  650 mg Oral Q4H PRN Seals, Theo Dills, NP   650 mg at 08/15/18 1516  . albuterol (PROVENTIL) (2.5 MG/3ML) 0.083% nebulizer solution 2.5 mg  2.5 mg  Nebulization Q2H PRN Fritzi Mandes, MD      . atorvastatin (LIPITOR) tablet 40 mg  40 mg Oral q1800 Larey Dresser, MD   40 mg at 08/15/18 1741  . Chlorhexidine Gluconate Cloth 2 % PADS 6 each  6 each Topical Q0600 Lavonia Dana, MD   6 each at 08/16/18 0520  . clonazePAM (KLONOPIN) tablet 1 mg  1 mg Oral BID Lavella Hammock, MD   1 mg at 08/16/18 0915  . cyclobenzaprine (FLEXERIL) tablet 10 mg  10 mg Oral TID PRN Bradly Bienenstock, NP      . DULoxetine (CYMBALTA) DR capsule 30 mg  30 mg Oral Daily Fritzi Mandes, MD   30 mg at 08/16/18 0915  . epoetin alfa (EPOGEN) injection 10,000 Units  10,000 Units Intravenous Q T,Th,Sa-HD Lavonia Dana, MD   10,000 Units at 08/15/18 1508  . feeding supplement (NEPRO CARB STEADY) liquid 237 mL  237 mL Oral BID BM Fritzi Mandes, MD   Stopped at 08/14/18 1558  . ferrous sulfate tablet 325 mg  325 mg Oral Daily Darel Hong D, NP   325 mg at 08/16/18 0915  . fluticasone (FLONASE) 50 MCG/ACT nasal spray 2 spray  2 spray Each Nare PRN Bradly Bienenstock, NP   2 spray at 08/12/18 [phone]  . hydrALAZINE (APRESOLINE) injection 10-20 mg  10-20 mg Intravenous Q6H PRN Awilda Bill, NP   20 mg at 08/15/18 0301  . hydrALAZINE (APRESOLINE) tablet 25 mg  25 mg Oral TID Larey Dresser, MD   25 mg at 08/16/18 1348  . ipratropium-albuterol (DUONEB) 0.5-2.5 (3) MG/3ML nebulizer solution 3 mL  3 mL Nebulization Q6H Fritzi Mandes, MD   3 mL at 08/15/18 1927  . isosorbide mononitrate (IMDUR) 24 hr tablet 30 mg  30 mg Oral Daily Fritzi Mandes, MD   30 mg at 08/16/18 0915  . labetalol (NORMODYNE) injection 20 mg  20 mg Intravenous Q6H PRN Bradly Bienenstock, NP   10 mg at 08/14/18 1552  .  levothyroxine (SYNTHROID) tablet 25 mcg  25 mcg Oral Q0600 Paticia Stack, [place]   25 mcg at 08/16/18 0518  . lidocaine (XYLOCAINE) 2 % viscous mouth solution 15 mL  15 mL Mouth/Throat PRN Darel Hong D, NP   15 mL at 08/07/18 1636  . methylPREDNISolone sodium succinate (SOLU-MEDROL) 125 mg/2 mL  injection 60 mg  60 mg Intravenous Daily Fritzi Mandes, MD   60 mg at 08/16/18 0915  . metoprolol succinate (TOPROL-XL) 24 hr tablet 50 mg  50 mg Oral Daily Christell Faith M, PA-C   50 mg at 08/16/18 0915  . mometasone-formoterol (DULERA) 200-5 MCG/ACT inhaler 2 puff  2 puff Inhalation BID Darel Hong D, NP   2 puff at 08/16/18 0916  . multivitamin (RENA-VIT) tablet 1 tablet  1 tablet Oral QHS Fritzi Mandes, MD   1 tablet at 08/15/18 2151  . ondansetron (ZOFRAN) injection 4 mg  4 mg Intravenous Q6H PRN Stegmayer, Kimberly A, PA-C      . oxyCODONE (Oxy IR/ROXICODONE) immediate release tablet 5 mg  5 mg Oral Q6H PRN Mansy, Jan A, MD   5 mg at 08/16/18 0914  . Racepinephrine HCl 2.25 % nebulizer solution 0.5 mL  0.5 mL Nebulization Q4H PRN Fritzi Mandes, MD   0.5 mL at 08/14/18 1335  . sodium chloride flush (NS) 0.9 % injection 10 mL  10 mL Intravenous Q12H Fritzi Mandes, MD   10 mL at 08/16/18 0917  . sodium chloride flush (NS) 0.9 % injection 3 mL  3 mL Intravenous Q12H Seals, Angela H, NP   3 mL at 08/16/18 0916  . sodium chloride flush (NS) 0.9 % injection 3 mL  3 mL Intravenous PRN Seals, Levada Dy H, NP      . SUMAtriptan (IMITREX) tablet 100 mg  100 mg Oral TID PRN Fritzi Mandes, MD   100 mg at 08/14/18 0020  . zolpidem (AMBIEN) tablet 5 mg  5 mg Oral QHS PRN Tukov-Yual, Magdalene S, NP   5 mg at 08/15/18 2151   Facility-Administered Medications Ordered in Other Encounters  Medication Dose Route Frequency Provider Last Rate Last Dose  . 0.9 %  sodium chloride infusion   Intravenous Once Lloyd Huger, MD      . 0.9 %  sodium chloride infusion   Intravenous Once Lloyd Huger, MD      . dexamethasone (DECADRON) 20 mg in sodium chloride 0.9 % 50 mL IVPB  20 mg Intravenous Once Lloyd Huger, MD      . dexamethasone (DECADRON) injection 10 mg  10 mg Intravenous Once Lloyd Huger, MD      . sodium chloride flush (NS) 0.9 % injection 10 mL  10 mL Intravenous PRN Lloyd Huger, MD    10 mL at 06/16/17 1287    Musculoskeletal: Strength & Muscle Tone: within normal limits Gait & Station: not assessed Patient leans: N/A  Psychiatric Specialty Exam: Physical Exam  Nursing note and vitals reviewed. Constitutional: She is oriented to person, place, and time. She appears well-developed and well-nourished. No distress.  HENT:  Head: Normocephalic and atraumatic.  Eyes: EOM are normal.  Neck: Normal range of motion.  Cardiovascular: Normal rate and regular rhythm.  Respiratory: Effort normal. No respiratory distress.  Musculoskeletal: Normal range of motion.  Neurological: She is alert and oriented to person, place, and time.  Psychiatric: Her speech is normal and behavior is normal. Judgment and thought content normal. Her mood appears anxious. Cognition and memory are  normal.    Review of Systems  Constitutional: Positive for malaise/fatigue.  Respiratory: Negative.   Cardiovascular: Negative.   Musculoskeletal: Negative.   Neurological: Positive for weakness.  Psychiatric/Behavioral: Negative for hallucinations, memory loss, substance abuse and suicidal ideas. The patient is nervous/anxious.     Blood pressure (!) 163/90, pulse 85, temperature 98.3 F (36.8 C), temperature source Oral, resp. rate 20, height 5\' 2"  (1.575 m), weight 71.9 kg, SpO2 94 %.Body mass index is 28.99 kg/m.  General Appearance: Casual   Eye Contact:  Fair  Speech:  Clear and Coherent and Normal Rate  Volume:  Normal  Mood:  Anxious, mild  Affect:  Congruent  Thought Process:  Coherent, Linear and Descriptions of Associations: Intact  Orientation:  Full (Time, Place, and Person)  Thought Content:  Logical and Hallucinations: None  Suicidal Thoughts:  No  Homicidal Thoughts:  No  Memory:  Immediate;   Good Recent;   Good Remote;   Good  Judgement:  Good  Insight:  Good  Psychomotor Activity:  Normal  Concentration:  Concentration: Good  Recall:  Good  Fund of Knowledge:  Good   Language:  Good  Akathisia:  No  Handed:  Right  AIMS (if indicated):     Assets:  Communication Skills Desire for Improvement Financial Resources/Insurance Housing Resilience Social Support  ADL's:  Intact  Cognition:  WNL  Sleep:   Slept well overnight     Treatment Plan Summary: Daily contact with patient to assess and evaluate symptoms and progress in treatment and Medication management Major depression, chronic and Anxiety: -Continue Cymbalta 60 mg daily as this appears to be effective in treating patient's depression. -Continue Klonopin 1 mg twice daily.   -Dr Dwyane Dee reviewed this client and concurs with the plan of care  Disposition: No evidence of imminent risk to self or others at present.   Patient does not meet criteria for psychiatric inpatient admission. Supportive therapy provided about ongoing stressors.  Waylan Boga, NP 08/16/2018 2:40 PM

## 2018-08-16 NOTE — Evaluation (Addendum)
Physical Therapy Evaluation Patient Details Name: Jessica Burgess MRN: [id number] DOB: 1955/11/22 Today's Date: 08/16/2018   History of Present Illness   63 y.o. female patient with a known history of ovarian cancer, CKD, hypothyroidism, hypertension and anemia.  Patient is currently undergoing chemotherapy. Presented to ED 5/21, admitted to CCU 5/22, returned to CCU 5/29 on bipap and for emergent HD. Transferred to floor 5/30. Temp femoral cath placed but failed, temp IJ cath placed 5/25, potential permacath placement on Monday per chart review.    Clinical Impression  Pt easily woken at start of session, impulsive to mobilize. Pt reported living with her "sister roommate", previously independent.   Pt demonstrated bed mobility and transfers with mod I. Often cued during session to adjust pacing of activity to allow for PLB and safety. Pt ambulated ~253ft with no AD supervision/modI. Pt with mild gait path deviations during ambulation, no overt LOB noted. Decreased gait speed and exhibited some fatigue and SOB. Cued for standing rest breaks. O2 monitored throughout, did desat on several times on room air (lowest 83%), but quickly recovered >90% with cues for PLB and standing rest breaks, RN notified. Pt also educated about potential benefit of SPC of walker upon return to home to increase safety and activity tolerance, pt agreeable.  Overall the patient demonstrated deficits (see "PT Problem List") that impede the patient's functional abilities, safety, and mobility and would benefit from skilled PT intervention. Recommendation is HHPT.       Follow Up Recommendations Home health PT    Equipment Recommendations  None recommended by PT(Pt reported having SPC and walker at home)    Recommendations for Other Services       Precautions / Restrictions Precautions Precautions: Fall Restrictions Weight Bearing Restrictions: No      Mobility  Bed Mobility Overal bed mobility: Needs  Assistance Bed Mobility: Sit to Supine       Sit to supine: Modified independent (Device/Increase time)      Transfers Overall transfer level: Modified independent                  Ambulation/Gait Ambulation/Gait assistance: Supervision;Modified independent (Device/Increase time) Gait Distance (Feet): 200 Feet Assistive device: None       General Gait Details: Pt with mild gait path deviations during ambulation, no overt LOB noted. Decreased gait speed and exhibited some fatigue and SOB. Cued for standing rest breaks. O2 monitored throughout, did desat on roomair, but quicklt recovered with cues for PLB and standing rest breaks.   Stairs            Wheelchair Mobility    Modified Rankin (Stroke Patients Only)       Balance Overall balance assessment: Needs assistance Sitting-balance support: Feet supported Sitting balance-Leahy Scale: Good       Standing balance-Leahy Scale: Good                               Pertinent Vitals/Pain Pain Assessment: No/denies pain    Home Living Family/patient expects to be discharged to:: Private residence Living Arrangements: Other (Comment)("sister roommate") Available Help at Discharge: Family Type of Home: House Home Access: Ramped entrance     Home Layout: Multi-level;Able to live on main level with bedroom/bathroom Home Equipment: Gilford Rile - 2 wheels;Walker - 4 wheels;Cane - single point;Wheelchair - manual;Grab bars - toilet;Grab bars - tub/shower;Shower seat - built in      Prior Function Level of Independence:  Independent         Comments: Stated she does use her SPC and walker as needed.      Hand Dominance        Extremity/Trunk Assessment   Upper Extremity Assessment Upper Extremity Assessment: Generalized weakness    Lower Extremity Assessment Lower Extremity Assessment: Generalized weakness    Cervical / Trunk Assessment Cervical / Trunk Assessment: Normal   Communication   Communication: No difficulties  Cognition Arousal/Alertness: Awake/alert Behavior During Therapy: WFL for tasks assessed/performed Overall Cognitive Status: Within Functional Limits for tasks assessed                                        General Comments      Exercises     Assessment/Plan    PT Assessment Patient needs continued PT services  PT Problem List Decreased strength;Decreased mobility;Decreased activity tolerance;Decreased balance       PT Treatment Interventions DME instruction;Functional mobility training;Balance training;Patient/family education;Gait training;Therapeutic activities;Neuromuscular re-education;Stair training;Therapeutic exercise    PT Goals (Current goals can be found in the Care Plan section)  Acute Rehab PT Goals Patient Stated Goal: to go home PT Goal Formulation: With patient Time For Goal Achievement: 08/30/18 Potential to Achieve Goals: Good    Frequency Min 2X/week   Barriers to discharge        Co-evaluation               AM-PAC PT "6 Clicks" Mobility  Outcome Measure Help needed turning from your back to your side while in a flat bed without using bedrails?: A Little Help needed moving from lying on your back to sitting on the side of a flat bed without using bedrails?: A Little Help needed moving to and from a bed to a chair (including a wheelchair)?: A Little Help needed standing up from a chair using your arms (e.g., wheelchair or bedside chair)?: None Help needed to walk in hospital room?: A Little Help needed climbing 3-5 steps with a railing? : A Little 6 Click Score: 19    End of Session Equipment Utilized During Treatment: Gait belt Activity Tolerance: Patient tolerated treatment well Patient left: in chair;with call bell/phone within reach(RN consent for no chair alarm) Nurse Communication: Mobility status PT Visit Diagnosis: Muscle weakness (generalized) (M62.81);Other  abnormalities of gait and mobility (R26.89)    Time: 1916-6060 PT Time Calculation (min) (ACUTE ONLY): 23 min   Charges:   PT Evaluation $PT Eval Low Complexity: 1 Low PT Treatments $Therapeutic Exercise: 8-22 mins       Lieutenant Diego PT, DPT 1:02 PM,08/16/18 [phone]

## 2018-08-16 NOTE — Progress Notes (Signed)
[facility]  ROUNDING NOTE   Subjective:   Hemodialysis treatment yesterday. UF of 2 liters. Some nausea at the end of dialysis treatment  Weaned off oxygen this morning, now on room air.   Sitting in chair.   Objective:  Vital signs in last 24 hours:  Temp:  [98.1 F (36.7 C)-98.5 F (36.9 C)] 98.3 F (36.8 C) (05/31 1217) Pulse Rate:  [85-96] 85 (05/31 1217) Resp:  [13-20] 20 (05/31 1217) BP: (98-167)/(62-92) 163/90 (05/31 1217) SpO2:  [90 %-100 %] 94 % (05/31 1217) Weight:  [71.9 kg-78.2 kg] 71.9 kg (05/31 0500)  Weight change: -5.4 kg Filed Weights   08/15/18 1345 08/15/18 1658 08/16/18 0500  Weight: 74.6 kg 78.2 kg 71.9 kg    Intake/Output: I/O last 3 completed shifts: In: 393.7 [P.O.:240; I.V.:153.7] Out: 2025 [Urine:1225; Other:800]   Intake/Output this shift:  No intake/output data recorded.  Physical Exam: General: NAD, sitting in chair  Head: Normocephalic, atraumatic. Moist oral mucosal membranes  Eyes: Anicteric, PERRL  Neck: Supple, trachea midline  Lungs:  Clear to auscultation, breathing room air  Heart: Regular rate and rhythm  Abdomen:  Soft, nontender,   Extremities:  no peripheral edema.  Neurologic: Nonfocal, moving all four extremities  Skin: No lesions  Access: Right femoral temp cath 5/25. LIJ temp HD catheter    Basic Metabolic Panel: Recent Labs  Lab 08/10/18 1640  08/12/18 0904 08/13/18 0526 08/14/18 0336 08/15/18 0926 08/16/18 0422  NA  --    < > 135 136 132* 136 137  K  --    < > 4.0 3.7 4.0 4.0 4.0  CL  --    < > 98 97* 96* 97* 98  CO2  --    < > 27 28 22  21* 26  GLUCOSE  --    < > 168* 95 150* 224* 106*  BUN  --    < > 47* 53* 67* 89* 59*  CREATININE  --    < > 3.36* 4.02* 5.02* 5.65* 3.89*  CALCIUM  --    < > 8.3* 7.9* 8.0* 8.2* 7.8*  PHOS 5.5*  --  3.9 4.4  --   --   --    < > = values in this interval not displayed.    Liver Function Tests: Recent Labs  Lab 08/12/18 0904 08/13/18 0526  08/15/18 0926  AST  --   --  38  ALT  --   --  19  ALKPHOS  --   --  61  BILITOT  --   --  1.4*  PROT  --   --  6.2*  ALBUMIN 3.1* 3.0* 3.3*   No results for input(s): LIPASE, AMYLASE in the last 168 hours. No results for input(s): AMMONIA in the last 168 hours.  CBC: Recent Labs  Lab 08/13/18 0526 08/14/18 1543 08/15/18 0533 08/16/18 0422  WBC 7.5 10.7* 11.9* 13.1*  NEUTROABS  --   --  9.2*  --   HGB 6.5* 8.5* 8.0* 7.5*  HCT 21.3* 25.5* 24.1* 23.4*  MCV 113.9* 101.6* 102.6* 104.9*  PLT 94* 124* 91* 68*    Cardiac Enzymes: No results for input(s): CKTOTAL, CKMB, CKMBINDEX, TROPONINI in the last 168 hours.  BNP: Invalid input(s): POCBNP  CBG: Recent Labs  Lab 08/14/18 1305  GLUCAP 185*    Microbiology: Results for orders placed or performed during the hospital encounter of 08/06/18  SARS Coronavirus 2 (CEPHEID - Performed in [facility] hospital lab), [facility]  Status: None   Collection Time: 08/06/18  8:15 PM  Result Value Ref Range Status   SARS Coronavirus 2 NEGATIVE NEGATIVE Final    Comment: (NOTE) If result is NEGATIVE SARS-CoV-2 target nucleic acids are NOT DETECTED. The SARS-CoV-2 RNA is generally detectable in upper and lower  respiratory specimens during the acute phase of infection. The lowest  concentration of SARS-CoV-2 viral copies this assay can detect is 250  copies / mL. A negative result does not preclude SARS-CoV-2 infection  and should not be used as the sole basis for treatment or other  patient management decisions.  A negative result may occur with  improper specimen collection / handling, submission of specimen other  than nasopharyngeal swab, presence of viral mutation(s) within the  areas targeted by this assay, and inadequate number of viral copies  (<250 copies / mL). A negative result must be combined with clinical  observations, patient history, and epidemiological information. If result is POSITIVE SARS-CoV-2 target  nucleic acids are DETECTED. The SARS-CoV-2 RNA is generally detectable in upper and lower  respiratory specimens dur ing the acute phase of infection.  Positive  results are indicative of active infection with SARS-CoV-2.  Clinical  correlation with patient history and other diagnostic information is  necessary to determine patient infection status.  Positive results do  not rule out bacterial infection or co-infection with other viruses. If result is PRESUMPTIVE POSTIVE SARS-CoV-2 nucleic acids MAY BE PRESENT.   A presumptive positive result was obtained on the submitted specimen  and confirmed on repeat testing.  While 2019 novel coronavirus  (SARS-CoV-2) nucleic acids may be present in the submitted sample  additional confirmatory testing may be necessary for epidemiological  and / or clinical management purposes  to differentiate between  SARS-CoV-2 and other Sarbecovirus currently known to infect humans.  If clinically indicated additional testing with an alternate test  methodology [phone]) is advised. The SARS-CoV-2 RNA is generally  detectable in upper and lower respiratory sp ecimens during the acute  phase of infection. The expected result is Negative. Fact Sheet for Patients:  StrictlyIdeas.no Fact Sheet for Healthcare Providers: BankingDealers.co.za This test is not yet approved or cleared by the [place] FDA and has been authorized for detection and/or diagnosis of SARS-CoV-2 by FDA under an Emergency Use Authorization (EUA).  This EUA will remain in effect (meaning this test can be used) for the duration of the COVID-19 declaration under Section 564(b)(1) of the Act, 21 U.S.C. section 360bbb-3(b)(1), unless the authorization is terminated or revoked sooner. Performed at [facility], [address] [place], [place] [address]   MRSA PCR Screening     Status: None   Collection Time: 08/07/18  2:38 AM   Result Value Ref Range Status   MRSA by PCR NEGATIVE NEGATIVE Final    Comment:        The GeneXpert MRSA Assay (FDA approved for NASAL specimens only), is one component of a comprehensive MRSA colonization surveillance program. It is not intended to diagnose MRSA infection nor to guide or monitor treatment for MRSA infections. Performed at [facility], [address] [place],  [address]   CULTURE, BLOOD (ROUTINE X 2) w Reflex to ID Panel     Status: None   Collection Time: 08/07/18  3:40 AM  Result Value Ref Range Status   Specimen Description BLOOD LEFT ANTECUBITAL  Final   Special Requests   Final    BOTTLES DRAWN AEROBIC AND ANAEROBIC Blood Culture results  may not be optimal due to an excessive volume of blood received in culture bottles   Culture   Final    NO GROWTH 5 DAYS Performed at [facility], [address] [place], [place] [address]    Report Status 08/12/2018 FINAL  Final  CULTURE, BLOOD (ROUTINE X 2) w Reflex to ID Panel     Status: None   Collection Time: 08/07/18  3:40 AM  Result Value Ref Range Status   Specimen Description BLOOD BLOOD LEFT HAND  Final   Special Requests   Final    BOTTLES DRAWN AEROBIC AND ANAEROBIC Blood Culture results may not be optimal due to an excessive volume of blood received in culture bottles   Culture   Final    NO GROWTH 5 DAYS Performed at [facility], [address], [place], [place] [address]    Report Status 08/12/2018 FINAL  Final  Urine Culture     Status: None   Collection Time: 08/08/18  2:10 AM  Result Value Ref Range Status   Specimen Description   Final    URINE, RANDOM Performed at [facility], [address], [place], [place] [address]    Special Requests   Final    Normal Performed at [facility], [address] [place], [place] [address]    Culture   Final    NO GROWTH Performed at [facility], [place] [address] [place], [place]  [address]    Report Status 08/09/2018 FINAL  Final    Coagulation Studies: No results for input(s): LABPROT, INR in the last 72 hours.  Urinalysis: No results for input(s): COLORURINE, LABSPEC, PHURINE, GLUCOSEU, HGBUR, BILIRUBINUR, KETONESUR, PROTEINUR, UROBILINOGEN, NITRITE, LEUKOCYTESUR in the last 72 hours.  Invalid input(s): APPERANCEUR    Imaging: Dg Chest Port 1 View  Result Date: 08/15/2018 CLINICAL DATA:  Catheter placement EXAM: PORTABLE CHEST 1 VIEW COMPARISON:  08/13/2018, 08/12/2018, 08/07/2018 FINDINGS: Right-sided central venous port tip over the SVC. New left IJ central venous catheter tip over the SVC. No pneumothorax. Cardiomegaly with vascular congestion and bilateral perihilar hazy and ground-glass opacities suspect for edema. Small bilateral effusions. Left basilar consolidation. IMPRESSION: 1. Left IJ central venous catheter tip over the SVC. No pneumothorax. 2. Cardiomegaly with vascular congestion, moderate pulmonary edema and continued small pleural effusions. Left basilar consolidation without change. Electronically Signed   By: Donavan Foil M.D.   On: 08/15/2018 03:57     Medications:   . sodium chloride     . sodium chloride   Intravenous Once  . atorvastatin  40 mg Oral q1800  . Chlorhexidine Gluconate Cloth  6 each Topical Q0600  . clonazePAM  1 mg Oral BID  . DULoxetine  30 mg Oral Daily  . epoetin (EPOGEN/PROCRIT) injection  10,000 Units Intravenous Q T,Th,Sa-HD  . feeding supplement (NEPRO CARB STEADY)  237 mL Oral BID BM  . ferrous sulfate  325 mg Oral Daily  . hydrALAZINE  25 mg Oral TID  . ipratropium-albuterol  3 mL Nebulization Q6H  . isosorbide mononitrate  30 mg Oral Daily  . levothyroxine  25 mcg Oral Q0600  . methylPREDNISolone (SOLU-MEDROL) injection  60 mg Intravenous Daily  . metoprolol succinate  50 mg Oral Daily  . mometasone-formoterol  2 puff Inhalation BID  . multivitamin  1 tablet Oral QHS  . sodium chloride flush  10 mL  Intravenous Q12H  . sodium chloride flush  3 mL Intravenous Q12H   sodium chloride,  acetaminophen, albuterol, cyclobenzaprine, fluticasone, hydrALAZINE, labetalol, lidocaine, ondansetron (ZOFRAN) IV, [DISCONTINUED] oxyCODONE-acetaminophen **AND** oxyCODONE, Racepinephrine HCl, sodium chloride flush, SUMAtriptan, zolpidem  Assessment/ Plan:  Jessica Burgess is a 63 y.o. white female with history ofovarian cancer stage III,GERD, migraine headaches, cervical spinal stenosis,who was admitted to [facility] on[facility]2020for evaluation of significant shortness of breath.   1. Acute renal failure on chronic kidney disease stage III with proteinuria baseline creatinine 1.7, GFR of 30 on 07/27/18.   Chronic kidney disease cause seems unclear.  Acute renal failure is thought to be secondary to acute cardiorenal syndrome.  Serologic work up with ANA positive but reflex antibodies nonreactive.  First treatment on 5/25.  Tunneled catheter for Monday. Appreciate Vascular input.  - Dialysis for tomorrow after tunneled catheter placed.  - Remove femoral diaysis temp catheter.   2. Hypertension: blood pressure are elevated Echo with systolic function slightly decreased, 45-50% EF With anxiety.  Metoprolol, hydralazine and isosorbide mononitrate  3. Anemia with acute renal failure: macrocytic. PRBC transfusion 5/28. Hemoglobin 7.5 EPO with HD treatments. Dr. Grayland Ormond cleared use of EPO.   4. Acute respiratory failure: secondary to acute exacerbation of congestive heart failure and volume overload Improved with ultrafiltration with hemodialysis.    LOS: [place] 5/31/20201:46 PM

## 2018-08-16 NOTE — Progress Notes (Signed)
[place] [facility] [place] NAME: Jessica Burgess    MR#:  [id number]  DATE OF BIRTH:  1956/02/18  SUBJECTIVE:  CHIEF COMPLAINT:   Chief Complaint  Patient presents with  . Shortness of Breath   -Patient transferred out of ICU.  Feels much better today.  Off oxygen.  Ambulated with physical therapy  REVIEW OF SYSTEMS:  Review of Systems  Constitutional: Positive for malaise/fatigue. Negative for chills and fever.  HENT: Negative for congestion, ear discharge, hearing loss and nosebleeds.   Eyes: Negative for blurred vision and double vision.  Respiratory: Negative for cough, shortness of breath and wheezing.   Cardiovascular: Negative for chest pain and palpitations.  Gastrointestinal: Negative for abdominal pain, constipation, diarrhea, nausea and vomiting.  Genitourinary: Negative for dysuria.  Musculoskeletal: Negative for myalgias.  Neurological: Negative for dizziness, focal weakness, seizures, weakness and headaches.  Psychiatric/Behavioral: Negative for depression.    DRUG ALLERGIES:  No Known Allergies  VITALS:  Blood pressure (!) 163/90, pulse 85, temperature 98.3 F (36.8 C), temperature source Oral, resp. rate 20, height 5\' 2"  (1.575 m), weight 71.9 kg, SpO2 94 %.  PHYSICAL EXAMINATION:  Physical Exam   GENERAL:  63 y.o.-year-old patient lying in the bed with no acute distress.  EYES: Pupils equal, round, reactive to light and accommodation. No scleral icterus. Extraocular muscles intact.  HEENT: Head atraumatic, normocephalic. Oropharynx and nasopharynx clear.  NECK:  Supple, no jugular venous distention. No thyroid enlargement, no tenderness.  Left IJ catheter in place LUNGS: Normal breath sounds bilaterally, no wheezing, rales,rhonchi or crepitation. No use of accessory muscles of respiration.  Decreased bibasilar breath sounds CARDIOVASCULAR: S1, S2 normal. No murmurs, rubs, or gallops.  Left IJ catheter in place  ABDOMEN: Soft, nontender, nondistended. Bowel sounds present. No organomegaly or mass.  EXTREMITIES: No pedal edema, cyanosis, or clubbing.  NEUROLOGIC: Cranial nerves II through XII are intact. Muscle strength 5/5 in all extremities. Sensation intact. Gait not checked.  PSYCHIATRIC: The patient is alert and oriented x 3.  SKIN: No obvious rash, lesion, or ulcer.    LABORATORY PANEL:   CBC Recent Labs  Lab 08/16/18 0422  WBC 13.1*  HGB 7.5*  HCT 23.4*  PLT 68*   ------------------------------------------------------------------------------------------------------------------  Chemistries  Recent Labs  Lab 08/15/18 0926 08/16/18 0422  NA 136 137  K 4.0 4.0  CL 97* 98  CO2 21* 26  GLUCOSE 224* 106*  BUN 89* 59*  CREATININE 5.65* 3.89*  CALCIUM 8.2* 7.8*  AST 38  --   ALT 19  --   ALKPHOS 61  --   BILITOT 1.4*  --    ------------------------------------------------------------------------------------------------------------------  Cardiac Enzymes No results for input(s): TROPONINI in the last 168 hours. ------------------------------------------------------------------------------------------------------------------  RADIOLOGY:  Dg Chest Port 1 View  Result Date: 08/15/2018 CLINICAL DATA:  Catheter placement EXAM: PORTABLE CHEST 1 VIEW COMPARISON:  08/13/2018, 08/12/2018, 08/07/2018 FINDINGS: Right-sided central venous port tip over the SVC. New left IJ central venous catheter tip over the SVC. No pneumothorax. Cardiomegaly with vascular congestion and bilateral perihilar hazy and ground-glass opacities suspect for edema. Small bilateral effusions. Left basilar consolidation. IMPRESSION: 1. Left IJ central venous catheter tip over the SVC. No pneumothorax. 2. Cardiomegaly with vascular congestion, moderate pulmonary edema and continued small pleural effusions. Left basilar consolidation without change. Electronically Signed   By: Donavan Foil M.D.   On: 08/15/2018 03:57     EKG:   Orders placed or performed during the hospital  encounter of 08/06/18  . ED EKG  . ED EKG  . 12 lead EKG  . 12 lead EKG  . EKG 12-Lead  . EKG 12-Lead  . EKG 12-Lead  . EKG 12-Lead  . EKG 12-Lead  . EKG 12-Lead    ASSESSMENT AND PLAN:   63 year old female with past medical history significant for ovarian cancer, GERD, migraine, CKD stage III, cervical spinal stenosis presented to hospital secondary to worsening shortness of breath.  1.  Acute hypoxic respiratory failure-not on home oxygen.  Secondary to CHF exacerbation and volume overload -was on Lasix drip.  Currently Lasix on hold as patient receiving dialysis.   -Echocardiogram with EF of 40 to 45% and increased LVH. -Continue strict input and output monitoring and oxygen support as tolerated.  -Acquired ICU transferred on BiPAP this hospital stay, was on 4 L oxygen yesterday.  Currently off of oxygen and on room air today.  2.  Acute renal failure on CKD stage III-creatinine at 3.89 today  - -concern for cardiorenal syndrome.  Baseline creatinine at 1.7 -  Currently on dialysis that was started on 08/10/2018. -Permacath placement on Monday by vascular.  Remove the femoral dialysis catheter today. -Appreciate nephrology input.  Will have to see if patient will need outpatient dialysis  3.  Hypothyroidism-continue Synthroid  4.  Anemia of chronic disease-continue to monitor hemoglobin.  Received transfusion prior to admission at the cancer center. -Also on Procrit.  Hemoglobin down to 7.5.  Transfuse if less than 7  5.  High-grade ovarian cancer-status post chemotherapy.  Appreciate oncology input  6.  Anxiety-appreciate psychiatry consult.  Continue Cymbalta and Klonopin for now.  7.  Hypertension- continue hydralazine, Imdur and metoprolol  8.  Acute on chronic COPD exacerbation-on IV steroids.  Wean steroids as tolerated.  Continue inhalers and nebulizers and monitor.  9.  DVT prophylaxis-teds and SCDs only  given anemia   Physical therapy has been consulted.  Patient is independent at baseline.  Lives at home with her sister Updated her sister over the phone today    All the records are reviewed and case discussed with Care Management/Social Workerr. Management plans discussed with the patient, family and they are in agreement.  CODE STATUS: Full code  TOTAL TIME TAKING CARE OF THIS PATIENT: 38 minutes.   POSSIBLE D/C IN 2 DAYS, DEPENDING ON CLINICAL CONDITION.   Gladstone Lighter M.D on 08/16/2018 at 1:30 PM  Between 7am to 6pm - Pager - [phone]  After 6pm go to [email or web address] - password [email or web address] [facility]  Office  [phone]  CC: Primary care physician; Steele Sizer, MD

## 2018-08-17 ENCOUNTER — Other Ambulatory Visit: Payer: PPO

## 2018-08-17 ENCOUNTER — Encounter: Payer: Self-pay | Admitting: Vascular Surgery

## 2018-08-17 ENCOUNTER — Encounter
Admission: EM | Disposition: A | Discharge status: Home / Self Care | Payer: Self-pay | Source: Home / Self Care | Attending: Internal Medicine

## 2018-08-17 DIAGNOSIS — N186 End stage renal disease: Secondary | ICD-10-CM

## 2018-08-17 HISTORY — PX: DIALYSIS/PERMA CATHETER INSERTION: CATH118288

## 2018-08-17 LAB — CBC
HCT: 23.1 % — ABNORMAL LOW (ref 36.0–46.0)
Hemoglobin: 7.5 g/dL — ABNORMAL LOW (ref 12.0–15.0)
MCH: 33.9 pg (ref 26.0–34.0)
MCHC: 32.5 g/dL (ref 30.0–36.0)
MCV: 104.5 fL — ABNORMAL HIGH (ref 80.0–100.0)
Platelets: 79 10*3/uL — ABNORMAL LOW (ref 150–400)
RBC: 2.21 MIL/uL — ABNORMAL LOW (ref 3.87–5.11)
RDW: 23.8 % — ABNORMAL HIGH (ref 11.5–15.5)
WBC: 15.5 10*3/uL — ABNORMAL HIGH (ref 4.0–10.5)
nRBC: 6.2 % — ABNORMAL HIGH (ref 0.0–0.2)

## 2018-08-17 LAB — BASIC METABOLIC PANEL
Anion gap: 15 (ref 5–15)
BUN: 87 mg/dL — ABNORMAL HIGH (ref 8–23)
CO2: 24 mmol/L (ref 22–32)
Calcium: 8.2 mg/dL — ABNORMAL LOW (ref 8.9–10.3)
Chloride: 97 mmol/L — ABNORMAL LOW (ref 98–111)
Creatinine, Ser: 5.29 mg/dL — ABNORMAL HIGH (ref 0.44–1.00)
GFR calc Af Amer: 9 mL/min — ABNORMAL LOW (ref >60)
GFR calc non Af Amer: 8 mL/min — ABNORMAL LOW (ref >60)
Glucose, Bld: 143 mg/dL — ABNORMAL HIGH (ref 70–99)
Potassium: 4 mmol/L (ref 3.5–5.1)
Sodium: 136 mmol/L (ref 135–145)

## 2018-08-17 LAB — PATHOLOGIST SMEAR REVIEW

## 2018-08-17 SURGERY — DIALYSIS/PERMA CATHETER INSERTION
Anesthesia: Choice

## 2018-08-17 MED ORDER — DIPHENHYDRAMINE HCL 50 MG/ML IJ SOLN
INTRAMUSCULAR | Status: AC
  Filled 2018-08-17: qty 1

## 2018-08-17 MED ORDER — FENTANYL CITRATE (PF) 100 MCG/2ML IJ SOLN
INTRAMUSCULAR | Status: AC
  Filled 2018-08-17: qty 2

## 2018-08-17 MED ORDER — LABETALOL HCL 5 MG/ML IV SOLN
INTRAVENOUS | Status: AC
  Filled 2018-08-17: qty 4

## 2018-08-17 MED ORDER — FENTANYL CITRATE (PF) 100 MCG/2ML IJ SOLN
INTRAMUSCULAR | Status: DC | PRN
  Administered 2018-08-17: 50 ug via INTRAVENOUS
  Administered 2018-08-17: 25 ug via INTRAVENOUS
  Administered 2018-08-17: 50 ug via INTRAVENOUS

## 2018-08-17 MED ORDER — MIDAZOLAM HCL 5 MG/5ML IJ SOLN
INTRAMUSCULAR | Status: AC
  Filled 2018-08-17: qty 5

## 2018-08-17 MED ORDER — MORPHINE SULFATE (PF) 2 MG/ML IV SOLN
2.0000 mg | Freq: Four times a day (QID) | INTRAVENOUS | Status: DC | PRN
  Administered 2018-08-17: 2 mg via INTRAVENOUS
  Filled 2018-08-17: qty 2
  Filled 2018-08-17: qty 1

## 2018-08-17 MED ORDER — MIDAZOLAM HCL 2 MG/2ML IJ SOLN
INTRAMUSCULAR | Status: DC | PRN
  Administered 2018-08-17 (×2): 2 mg via INTRAVENOUS
  Administered 2018-08-17: 1 mg via INTRAVENOUS

## 2018-08-17 MED ORDER — DIPHENHYDRAMINE HCL 50 MG/ML IJ SOLN
INTRAMUSCULAR | Status: DC | PRN
  Administered 2018-08-17: 25 mg via INTRAVENOUS

## 2018-08-17 SURGICAL SUPPLY — 14 items
ADH SKN CLS APL DERMABOND .7 (GAUZE/BANDAGES/DRESSINGS) ×1
BIOPATCH WHT 1IN DISK W/4.0 H (GAUZE/BANDAGES/DRESSINGS) ×2 IMPLANT
CATH PALINDROME-SP 14.5FX23 (CATHETERS) ×2 IMPLANT
CATH PIG 70CM (CATHETERS) IMPLANT
DERMABOND ADVANCED (GAUZE/BANDAGES/DRESSINGS) ×2
DERMABOND ADVANCED .7 DNX12 (GAUZE/BANDAGES/DRESSINGS) IMPLANT
GUIDEWIRE SUPER STIFF .035X180 (WIRE) ×2 IMPLANT
PACK ANGIOGRAPHY (CUSTOM PROCEDURE TRAY) ×2 IMPLANT
SHEATH BRITE TIP 5FRX11 (SHEATH) IMPLANT
SUT MNCRL AB 4-0 PS2 18 (SUTURE) ×2 IMPLANT
SUT PROLENE 0 CT 1 30 (SUTURE) ×2 IMPLANT
SUT VICRYL+ 3-0 36IN CT-1 (SUTURE) ×2 IMPLANT
TUBING CONTRAST HIGH PRESS 72 (TUBING) IMPLANT
WIRE J 3MM .035X145CM (WIRE) IMPLANT

## 2018-08-17 NOTE — Progress Notes (Signed)
Pre HD Tx    08/17/18 1010  Hand-Off documentation  Report given to (Full Name) Trellis Paganini RN  Report received from (Full Name) Lindsay RN & Acey Lav RN  Vital Signs  Temp 97.9 F (36.6 C)  Temp Source Oral  Pulse Rate 83  Pulse Rate Source Monitor  Resp 18  BP (!) 158/82  BP Location Right Arm  BP Method Automatic  Patient Position (if appropriate) Lying  Oxygen Therapy  SpO2 95 %  O2 Device Nasal Cannula  O2 Flow Rate (L/min) 2 L/min  Pain Assessment  Pain Scale 0-10  Pain Score 0  Dialysis Weight  Weight 74.8 kg  Type of Weight Pre-Dialysis  Time-Out for Hemodialysis  What Procedure? Hemodialysis  Pt Identifiers(min of two) First/Last Name;MRN/Account#  Correct Site? Yes  Correct Side? Yes  Correct Procedure? Yes  Consents Verified? Yes  Rad Studies Available? N/A  Safety Precautions Reviewed? Yes  Engineer, civil (consulting) Number 1  Station Number 1  UF/Alarm Test Passed  Conductivity: Meter 13.8  Conductivity: Machine  14  pH 7.4  Reverse Osmosis Main  Normal Saline Lot Number [id number]  Dialyzer Lot Number [id number]  Disposable Set Lot Number [id number]  Machine Temperature 98.6 F (37 C)  Musician and Audible Yes  Blood Lines Intact and Secured Yes  Pre Treatment Patient Checks  Vascular access used during treatment Catheter  Hepatitis B Surface Antigen Results Negative  Date Hepatitis B Surface Antigen Drawn 08/10/18  Hepatitis B Surface Antibody  (>10)  Date Hepatitis B Surface Antibody Drawn 08/10/18  Hemodialysis Consent Verified Yes  Hemodialysis Standing Orders Initiated Yes  ECG (Telemetry) Monitor On Yes  Prime Ordered Normal Saline  Length of  DialysisTreatment -hour(s) 3.5 Hour(s)  Dialysis Treatment Comments Na 140  Dialyzer Elisio 17H NR  Dialysate 3K, 2.5 Ca  Dialysate Flow Ordered 600  Blood Flow Rate Ordered 400 mL/min  Ultrafiltration Goal 2 Liters  Dialysis Blood Pressure Support Ordered Normal Saline   Education / Care Plan  Dialysis Education Provided Yes  Documented Education in Care Plan Yes  Hemodialysis Catheter Left Internal jugular Double-lumen  Placement Date/Time: 08/15/18 0215   Placed prior to admission: No  Time Out: Correct patient;Correct site;Correct procedure  Maximum sterile barrier precautions: Hand hygiene;Cap;Mask;Sterile gown;Sterile gloves;Large sterile sheet  Site Prep: Chlorh...  Site Condition No complications  Blue Lumen Status Capped (Central line)  Red Lumen Status Capped (Central line)  Purple Lumen Status N/A  Dressing Type Biopatch;Occlusive  Dressing Status Clean;Dry;Intact  Interventions New dressing  Drainage Description None

## 2018-08-17 NOTE — Progress Notes (Signed)
[facility]  ROUNDING NOTE   Subjective:   Patient seen during dialysis Tolerating well    HEMODIALYSIS FLOWSHEET:  Blood Flow Rate (mL/min): 200 mL/min Arterial Pressure (mmHg): -190 mmHg Venous Pressure (mmHg): 180 mmHg Transmembrane Pressure (mmHg): 60 mmHg Ultrafiltration Rate (mL/min): 0 mL/min Dialysate Flow Rate (mL/min): 600 ml/min Conductivity: Machine : 14 Conductivity: Machine : 14 Dialysis Fluid Bolus: Normal Saline Bolus Amount (mL): 250 mL     Objective:  Vital signs in last 24 hours:  Temp:  [97.6 F (36.4 C)-98.8 F (37.1 C)] 98.2 F (36.8 C) (06/01 1430) Pulse Rate:  [71-103] 76 (06/01 1430) Resp:  [14-29] 16 (06/01 1430) BP: (112-225)/(67-147) 147/79 (06/01 1430) SpO2:  [94 %-100 %] 100 % (06/01 1430) Weight:  [72.1 kg-74.8 kg] 73.2 kg (06/01 1430)  Weight change: -2.5 kg Filed Weights   08/17/18 0826 08/17/18 1010 08/17/18 1430  Weight: 74.8 kg 74.8 kg 73.2 kg    Intake/Output: I/O last 3 completed shifts: In: 4.4 [I.V.:4.4] Out: 1100 [Urine:1100]   Intake/Output this shift:  Total I/O In: 51.1 [P.O.:30; I.V.:21.1] Out: 1600 [Urine:100; Other:1500]  Physical Exam: General: NAD,    Head: Normocephalic, atraumatic. Moist oral mucosal membranes  Eyes: Anicteric,    Neck: Supple, trachea midline  Lungs:  Clear to auscultation   Heart: Regular rate and rhythm  Abdomen:  Soft, nontender,   Extremities:  no peripheral edema.  Neurologic: Sleepy but able to answer questions  Skin: No lesions  Access:  Left IJ PC (6/1) Dr Lucky Cowboy    Basic Metabolic Panel: Recent Labs  Lab 08/12/18 [phone] 08/13/18 0526 08/14/18 0336 08/15/18 0926 08/16/18 0422 08/17/18 0412  NA 135 136 132* 136 137 136  K 4.0 3.7 4.0 4.0 4.0 4.0  CL 98 97* 96* 97* 98 97*  CO2 27 28 22  21* 26 24  GLUCOSE 168* 95 150* 224* 106* 143*  BUN 47* 53* 67* 89* 59* 87*  CREATININE 3.36* 4.02* 5.02* 5.65* 3.89* 5.29*  CALCIUM 8.3* 7.9* 8.0* 8.2* 7.8* 8.2*  PHOS  3.9 4.4  --   --   --   --     Liver Function Tests: Recent Labs  Lab 08/12/18 0904 08/13/18 0526 08/15/18 0926  AST  --   --  38  ALT  --   --  19  ALKPHOS  --   --  61  BILITOT  --   --  1.4*  PROT  --   --  6.2*  ALBUMIN 3.1* 3.0* 3.3*   No results for input(s): LIPASE, AMYLASE in the last 168 hours. No results for input(s): AMMONIA in the last 168 hours.  CBC: Recent Labs  Lab 08/13/18 0526 08/14/18 1543 08/15/18 0533 08/16/18 0422 08/17/18 0412  WBC 7.5 10.7* 11.9* 13.1* 15.5*  NEUTROABS  --   --  9.2*  --   --   HGB 6.5* 8.5* 8.0* 7.5* 7.5*  HCT 21.3* 25.5* 24.1* 23.4* 23.1*  MCV 113.9* 101.6* 102.6* 104.9* 104.5*  PLT 94* 124* 91* 68* 79*    Cardiac Enzymes: No results for input(s): CKTOTAL, CKMB, CKMBINDEX, TROPONINI in the last 168 hours.  BNP: Invalid input(s): POCBNP  CBG: Recent Labs  Lab 08/14/18 1305  GLUCAP 185*    Microbiology: Results for orders placed or performed during the hospital encounter of 08/06/18  SARS Coronavirus 2 (CEPHEID - Performed in [place] hospital lab), Hosp Order     Status: None   Collection Time: 08/06/18  8:15 PM  Result  Value Ref Range Status   SARS Coronavirus 2 NEGATIVE NEGATIVE Final    Comment: (NOTE) If result is NEGATIVE SARS-CoV-2 target nucleic acids are NOT DETECTED. The SARS-CoV-2 RNA is generally detectable in upper and lower  respiratory specimens during the acute phase of infection. The lowest  concentration of SARS-CoV-2 viral copies this assay can detect is 250  copies / mL. A negative result does not preclude SARS-CoV-2 infection  and should not be used as the sole basis for treatment or other  patient management decisions.  A negative result may occur with  improper specimen collection / handling, submission of specimen other  than nasopharyngeal swab, presence of viral mutation(s) within the  areas targeted by this assay, and inadequate number of viral copies  (<250 copies / mL). A  negative result must be combined with clinical  observations, patient history, and epidemiological information. If result is POSITIVE SARS-CoV-2 target nucleic acids are DETECTED. The SARS-CoV-2 RNA is generally detectable in upper and lower  respiratory specimens dur ing the acute phase of infection.  Positive  results are indicative of active infection with SARS-CoV-2.  Clinical  correlation with patient history and other diagnostic information is  necessary to determine patient infection status.  Positive results do  not rule out bacterial infection or co-infection with other viruses. If result is PRESUMPTIVE POSTIVE SARS-CoV-2 nucleic acids MAY BE PRESENT.   A presumptive positive result was obtained on the submitted specimen  and confirmed on repeat testing.  While 2019 novel coronavirus  (SARS-CoV-2) nucleic acids may be present in the submitted sample  additional confirmatory testing may be necessary for epidemiological  and / or clinical management purposes  to differentiate between  SARS-CoV-2 and other Sarbecovirus currently known to infect humans.  If clinically indicated additional testing with an alternate test  methodology [phone]) is advised. The SARS-CoV-2 RNA is generally  detectable in upper and lower respiratory sp ecimens during the acute  phase of infection. The expected result is Negative. Fact Sheet for Patients:  StrictlyIdeas.no Fact Sheet for Healthcare Providers: BankingDealers.co.za This test is not yet approved or cleared by the [place] FDA and has been authorized for detection and/or diagnosis of SARS-CoV-2 by FDA under an Emergency Use Authorization (EUA).  This EUA will remain in effect (meaning this test can be used) for the duration of the COVID-19 declaration under Section 564(b)(1) of the Act, 21 U.S.C. section 360bbb-3(b)(1), unless the authorization is terminated or revoked sooner. Performed  at [facility], [address] [place], [place] [address]   MRSA PCR Screening     Status: None   Collection Time: 08/07/18  2:38 AM  Result Value Ref Range Status   MRSA by PCR NEGATIVE NEGATIVE Final    Comment:        The GeneXpert MRSA Assay (FDA approved for NASAL specimens only), is one component of a comprehensive MRSA colonization surveillance program. It is not intended to diagnose MRSA infection nor to guide or monitor treatment for MRSA infections. Performed at [facility], [address] [place], [place] [address]   CULTURE, BLOOD (ROUTINE X 2) w Reflex to ID Panel     Status: None   Collection Time: 08/07/18  3:40 AM  Result Value Ref Range Status   Specimen Description BLOOD LEFT ANTECUBITAL  Final   Special Requests   Final    BOTTLES DRAWN AEROBIC AND ANAEROBIC Blood Culture results may not be optimal due to an excessive volume of blood received  in culture bottles   Culture   Final    NO GROWTH 5 DAYS Performed at [facility], [address] [place], [place] [address]    Report Status 08/12/2018 FINAL  Final  CULTURE, BLOOD (ROUTINE X 2) w Reflex to ID Panel     Status: None   Collection Time: 08/07/18  3:40 AM  Result Value Ref Range Status   Specimen Description BLOOD BLOOD LEFT HAND  Final   Special Requests   Final    BOTTLES DRAWN AEROBIC AND ANAEROBIC Blood Culture results may not be optimal due to an excessive volume of blood received in culture bottles   Culture   Final    NO GROWTH 5 DAYS Performed at [facility], [address] [place], [place] [address]    Report Status 08/12/2018 FINAL  Final  Urine Culture     Status: None   Collection Time: 08/08/18  2:10 AM  Result Value Ref Range Status   Specimen Description   Final    URINE, RANDOM Performed at [facility], [address], [place], [place] [address]    Special Requests   Final    Normal Performed at [facility],  [address], [place], [place] [address]    Culture   Final    NO GROWTH Performed at [facility], [place], [place], [place] [address]    Report Status 08/09/2018 FINAL  Final    Coagulation Studies: No results for input(s): LABPROT, INR in the last 72 hours.  Urinalysis: No results for input(s): COLORURINE, LABSPEC, PHURINE, GLUCOSEU, HGBUR, BILIRUBINUR, KETONESUR, PROTEINUR, UROBILINOGEN, NITRITE, LEUKOCYTESUR in the last 72 hours.  Invalid input(s): APPERANCEUR    Imaging: No results found.   Medications:   . sodium chloride Stopped (08/17/18 1523)   . sodium chloride   Intravenous Once  . atorvastatin  40 mg Oral q1800  . Chlorhexidine Gluconate Cloth  6 each Topical Q0600  . clonazePAM  1 mg Oral BID  . diphenhydrAMINE      . DULoxetine  30 mg Oral Daily  . epoetin (EPOGEN/PROCRIT) injection  10,000 Units Intravenous Q T,Th,Sa-HD  . feeding supplement (NEPRO CARB STEADY)  237 mL Oral BID BM  . fentaNYL      . fentaNYL      . ferrous sulfate  325 mg Oral Daily  . hydrALAZINE  25 mg Oral TID  . ipratropium-albuterol  3 mL Nebulization Q6H  . isosorbide mononitrate  30 mg Oral Daily  . labetalol      . levothyroxine  25 mcg Oral Q0600  . methylPREDNISolone (SOLU-MEDROL) injection  60 mg Intravenous Daily  . metoprolol succinate  50 mg Oral Daily  . midazolam      . mometasone-formoterol  2 puff Inhalation BID  . multivitamin  1 tablet Oral QHS  . sodium chloride flush  10 mL Intravenous Q12H  . sodium chloride flush  3 mL Intravenous Q12H   sodium chloride, acetaminophen, albuterol, cyclobenzaprine, fluticasone, hydrALAZINE, labetalol, lidocaine, ondansetron (ZOFRAN) IV, [DISCONTINUED] oxyCODONE-acetaminophen **AND** oxyCODONE, Racepinephrine HCl, sodium chloride flush, SUMAtriptan, zolpidem  Assessment/ Plan:  Ms. AYIANNA DARNOLD is a 63 y.o. white female with history ofovarian cancer stage III,GERD, migraine headaches, cervical spinal  stenosis,who was admitted to [facility] on5/21/2020for evaluation of significant shortness of breath.   1. Acute renal failure on chronic kidney disease stage III with proteinuria baseline creatinine 1.7, GFR of 30 on 07/27/18.   Chronic kidney disease cause  seems unclear.  Acute renal failure is thought to be secondary to acute cardiorenal syndrome.  Serologic work up with ANA positive but reflex antibodies nonreactive.  First treatment on 5/25.  Tunneled catheter placed today. Appreciate Vascular input.  Outpatient HD at [facility] TTS 6 PM Re-evaluate tomorrow  2.  Anemia with acute renal failure: macrocytic. PRBC transfusion 5/28.  Lab Results  Component Value Date   HGB 7.5 (L) 08/17/2018    EPO with HD treatments. Dr. Grayland Ormond cleared use of EPO.   4. Acute respiratory failure: secondary to acute exacerbation of congestive heart failure and volume overload Improved with ultrafiltration with hemodialysis.    LOS: 11 Marilene Vath 6/1/20205:22 PM

## 2018-08-17 NOTE — Care Management (Addendum)
Patient is currently in hemodialysis after having perm cath placed. TOC team will assess patient for home health needs prior to discharge. As of now [place] with patient pathways is working towards confirming a chair time with the patient and her friend who will provide transport. Patient will go to [facility] on [address]. Since patient was taken directly to dialysis she was unable to sit up   Update 15:35- Chair time set for TTS at 6:15

## 2018-08-17 NOTE — Care Management Important Message (Signed)
Important Message  Patient Details  Name: Jessica Burgess MRN: [id number] Date of Birth: 04/01/1955   Medicare Important Message Given:  Yes    Dannette Barbara 08/17/2018, 11:17 AM

## 2018-08-17 NOTE — Progress Notes (Signed)
[facility] NAME: Jessica Burgess    MR#:  [id number]  DATE OF BIRTH:  October 26, 1955  SUBJECTIVE:  CHIEF COMPLAINT:   Chief Complaint  Patient presents with  . Shortness of Breath   -Doing well.  Left IJ tunnel catheter placed today, permacath for dialysis. -Patient in dialysis today  REVIEW OF SYSTEMS:  Review of Systems  Constitutional: Positive for malaise/fatigue. Negative for chills and fever.  HENT: Negative for congestion, ear discharge, hearing loss and nosebleeds.   Eyes: Negative for blurred vision and double vision.  Respiratory: Negative for cough, shortness of breath and wheezing.   Cardiovascular: Negative for chest pain and palpitations.  Gastrointestinal: Negative for abdominal pain, constipation, diarrhea, nausea and vomiting.  Genitourinary: Negative for dysuria.  Musculoskeletal: Positive for myalgias.  Neurological: Negative for dizziness, focal weakness, seizures, weakness and headaches.  Psychiatric/Behavioral: Negative for depression.    DRUG ALLERGIES:   Allergies  Allergen Reactions  . Chlorhexidine Itching    Pt reports itching this AM post wipes w/ CHG, no redness or irritation assessed    VITALS:  Blood pressure (!) 147/79, pulse 76, temperature 97.9 F (36.6 C), temperature source Oral, resp. rate 16, height 5\' 2"  (1.575 m), weight 74.8 kg, SpO2 100 %.  PHYSICAL EXAMINATION:  Physical Exam   GENERAL:  63 y.o.-year-old patient lying in the bed with no acute distress.  EYES: Pupils equal, round, reactive to light and accommodation. No scleral icterus. Extraocular muscles intact.  HEENT: Head atraumatic, normocephalic. Oropharynx and nasopharynx clear.  NECK:  Supple, no jugular venous distention. No thyroid enlargement, no tenderness  LUNGS: Normal breath sounds bilaterally, no wheezing, rales,rhonchi or crepitation. No use of accessory muscles of respiration.  Decreased bibasilar breath  sounds CARDIOVASCULAR: S1, S2 normal. No murmurs, rubs, or gallops.  Permacath in place ABDOMEN: Soft, nontender, nondistended. Bowel sounds present. No organomegaly or mass.  EXTREMITIES: No pedal edema, cyanosis, or clubbing.  NEUROLOGIC: Cranial nerves II through XII are intact. Muscle strength 5/5 in all extremities. Sensation intact. Gait not checked.  PSYCHIATRIC: The patient is alert and oriented x 3.  SKIN: No obvious rash, lesion, or ulcer.    LABORATORY PANEL:   CBC Recent Labs  Lab 08/17/18 0412  WBC 15.5*  HGB 7.5*  HCT 23.1*  PLT 79*   ------------------------------------------------------------------------------------------------------------------  Chemistries  Recent Labs  Lab 08/15/18 0926  08/17/18 0412  NA 136   < > 136  K 4.0   < > 4.0  CL 97*   < > 97*  CO2 21*   < > 24  GLUCOSE 224*   < > 143*  BUN 89*   < > 87*  CREATININE 5.65*   < > 5.29*  CALCIUM 8.2*   < > 8.2*  AST 38  --   --   ALT 19  --   --   ALKPHOS 61  --   --   BILITOT 1.4*  --   --    < > = values in this interval not displayed.   ------------------------------------------------------------------------------------------------------------------  Cardiac Enzymes No results for input(s): TROPONINI in the last 168 hours. ------------------------------------------------------------------------------------------------------------------  RADIOLOGY:  No results found.  EKG:   Orders placed or performed during the hospital encounter of 08/06/18  . ED EKG  . ED EKG  . 12 lead EKG  . 12 lead EKG  . EKG 12-Lead  . EKG 12-Lead  . EKG 12-Lead  . EKG 12-Lead  .  EKG 12-Lead  . EKG 12-Lead    ASSESSMENT AND PLAN:   63 year old female with past medical history significant for ovarian cancer, GERD, migraine, CKD stage III, cervical spinal stenosis presented to hospital secondary to worsening shortness of breath.  1.  Acute hypoxic respiratory failure-not on home oxygen.  Secondary  to CHF exacerbation and volume overload -off Lasix drip.  Patient receiving hemodialysis -Echocardiogram with EF of 40 to 45% and increased LVH. -Continue strict input and output monitoring and oxygen support as tolerated.  -required ICU transfer for BiPAP this hospital stay,   Currently off of oxygen and on room air today.  2.  Acute renal failure on CKD stage III-creatinine at 5.29 today  - - cardiorenal syndrome.  Baseline creatinine at 1.7 -We will need dialysis in the short-term for now.  Permacath has been placed today. -  Currently on dialysis that was started on 08/10/2018. -Appreciate nephrology input.   -Awaiting to set up outpatient dialysis chair time.  3.  Hypothyroidism-continue Synthroid  4.  Anemia of chronic disease-continue to monitor hemoglobin.  Received transfusion prior to admission at the cancer center. -Also on Procrit.  Hemoglobin down to 7.5.  Transfuse if less than 7  5.  High-grade ovarian cancer-status post chemotherapy.  Appreciate oncology input  6.  Anxiety-appreciate psychiatry consult.  Continue Cymbalta and Klonopin for now.  7.  Hypertension- continue hydralazine, Imdur and metoprolol  8.  Acute on chronic COPD exacerbation-on IV steroids.  Wean steroids as tolerated.  Change to oral prednisone tomorrow.  Continue inhalers and nebulizers and monitor.  9.  DVT prophylaxis-teds and SCDs only given anemia   Physical therapy has been consulted.  Patient is independent at baseline.  Lives at home with her sister Possible discharge tomorrow    All the records are reviewed and case discussed with Care Management/Social Workerr. Management plans discussed with the patient, family and they are in agreement.  CODE STATUS: Full code  TOTAL TIME TAKING CARE OF THIS PATIENT: 36 minutes.   POSSIBLE D/C IN 1-2 DAYS, DEPENDING ON CLINICAL CONDITION.   Gladstone Lighter M.D on 08/17/2018 at 3:08 PM  Between 7am to 6pm - Pager - [phone]  After 6pm  go to [email or web address] - password [email or web address] [facility]  Office  [phone]  CC: Primary care physician; Steele Sizer, MD

## 2018-08-17 NOTE — Op Note (Signed)
OPERATIVE NOTE    PRE-OPERATIVE DIAGNOSIS: 1. ESRD   POST-OPERATIVE DIAGNOSIS: same as above  PROCEDURE: 1. Fluoroscopic guidance for placement of catheter 2. Placement of a 23 cm tip to cuff tunneled hemodialysis catheter via the left internal jugular vein  SURGEON: Leotis Pain, MD  ANESTHESIA:  Local with Moderate conscious sedation for approximately 20 minutes using 5 mg of Versed and 125 mcg of Fentanyl  ESTIMATED BLOOD LOSS: 5 cc  FLUORO TIME: less than one minute  CONTRAST: none  FINDING(S): 1.  None  SPECIMEN(S):  None  INDICATIONS:   Jessica Burgess is a 63 y.o. female who presents with renal failure.  The patient needs long term dialysis access for their ESRD, and a Permcath is necessary.  Risks and benefits are discussed and informed consent is obtained.    DESCRIPTION: After obtaining full informed written consent, the patient was brought back to the vascular suited. The patient's left neck and chest were sterilely prepped and draped in a sterile surgical field was created. Moderate conscious sedation was administered during a face to face encounter with the patient throughout the procedure with my supervision of the RN administering medicines and monitoring the patient's vital signs, pulse oximetry, telemetry and mental status throughout from the start of the procedure until the patient was taken to the recovery room.  The existing left jugular temporary catheter was included in the prep.  It was then rewired and the existing temporary catheter was removed. After skin nick and dilatation, the peel-away sheath was placed over the wire. I then turned my attention to an area under the clavicle. Approximately 1-2 fingerbreadths below the clavicle a small counterincision was created and tunneled from the subclavicular incision to the access site. Using fluoroscopic guidance, a 23 centimeter tip to cuff tunneled hemodialysis catheter was selected, and tunneled from the  subclavicular incision to the access site. It was then placed through the peel-away sheath and the peel-away sheath was removed. Using fluoroscopic guidance the catheter tips were parked in the right atrium. The appropriate distal connectors were placed. It withdrew blood well and flushed easily with heparinized saline and a concentrated heparin solution was then placed. It was secured to the chest wall with 2 Prolene sutures. The access incision was closed single 4-0 Monocryl. A 4-0 Monocryl pursestring suture was placed around the exit site. Sterile dressings were placed. The patient tolerated the procedure well and was taken to the recovery room in stable condition.  COMPLICATIONS: None  CONDITION: Stable  Leotis Pain  08/17/2018, 9:25 AM   This note was created with Dragon Medical transcription system. Any errors in dictation are purely unintentional.

## 2018-08-17 NOTE — Progress Notes (Signed)
HD Tx Start  Pt returned from perm cath placement in stable condition.  RN reports pt tolerating crackers and water well.     08/17/18 1040  Vital Signs  Pulse Rate 85  Resp 19  BP (!) 157/82  Oxygen Therapy  SpO2 98 %  O2 Device Nasal Cannula  O2 Flow Rate (L/min) 2 L/min  During Hemodialysis Assessment  Blood Flow Rate (mL/min) 400 mL/min  Arterial Pressure (mmHg) -150 mmHg  Venous Pressure (mmHg) 160 mmHg  Transmembrane Pressure (mmHg) 60 mmHg  Ultrafiltration Rate (mL/min) 710 mL/min (710 mL per HOUR removal)  Dialysate Flow Rate (mL/min) 600 ml/min  Conductivity: Machine  14  HD Safety Checks Performed Yes  Dialysis Fluid Bolus Normal Saline  Bolus Amount (mL) 250 mL  Intra-Hemodialysis Comments Tx initiated  Education / Care Plan  Dialysis Education Provided Yes  Documented Education in Care Plan Yes  Hemodialysis Catheter Left Internal jugular Double-lumen  Placement Date/Time: 08/15/18 0215   Placed prior to admission: No  Time Out: Correct patient;Correct site;Correct procedure  Maximum sterile barrier precautions: Hand hygiene;Cap;Mask;Sterile gown;Sterile gloves;Large sterile sheet  Site Prep: Chlorh...  Site Condition No complications  Blue Lumen Status Infusing  Red Lumen Status Infusing

## 2018-08-17 NOTE — Progress Notes (Signed)
Post HD Assessment    08/17/18 1430  Neurological  Level of Consciousness Alert  Orientation Level Oriented X4  Respiratory  Respiratory Pattern Regular;Unlabored  Chest Assessment Chest expansion symmetrical  Bilateral Breath Sounds Expiratory wheezes;Diminished  Cardiac  Pulse Regular  Heart Sounds S1, S2  ECG Monitor Yes  Cardiac Rhythm NSR  Vascular  R Radial Pulse +2  L Radial Pulse +2  Integumentary  Integumentary (WDL) X  Skin Color Appropriate for ethnicity  Skin Condition Dry  Skin Integrity Catheter entry/exit site  Musculoskeletal  Musculoskeletal (WDL) X  Generalized Weakness Yes  Gastrointestinal  Bowel Sounds Assessment Active  GU Assessment  Genitourinary (WDL) X  Genitourinary Symptoms Urinary Catheter (HD pt)  Urine Characteristics  Urine Color Yellow/straw  Urine Appearance Clear  Psychosocial  Psychosocial (WDL) WDL  Emotional support given Given to patient

## 2018-08-17 NOTE — Progress Notes (Signed)
Post HD Tx   1.5 liters removed, tolerated well. Heartburn during tx; relieved by elevated HOB to High fowler's.   Pt confirmed she is agreeable to MWF 4pm (3rd shift) at [facility] outpatient dialysis.

## 2018-08-17 NOTE — Progress Notes (Signed)
Pre HD Assessment    08/17/18 1015  Neurological  Level of Consciousness Alert  Orientation Level Oriented X4  Respiratory  Respiratory Pattern Regular;Unlabored  Chest Assessment Chest expansion symmetrical  Bilateral Breath Sounds Expiratory wheezes;Diminished  Cardiac  Pulse Regular  Heart Sounds S1, S2  ECG Monitor Yes  Cardiac Rhythm NSR  Vascular  R Radial Pulse +2  L Radial Pulse +2  Integumentary  Integumentary (WDL) X  Skin Color Appropriate for ethnicity  Skin Condition Dry  Skin Integrity Catheter entry/exit site  Additional Integumentary Comments  (HD pt, new start)  Musculoskeletal  Musculoskeletal (WDL) X  Generalized Weakness Yes  Gastrointestinal  Bowel Sounds Assessment Active  GU Assessment  Genitourinary (WDL) X  Genitourinary Symptoms Urinary Catheter (HD pt)  Urine Characteristics  Urine Color Yellow/straw  Urine Appearance Clear  Psychosocial  Psychosocial (WDL) WDL  Emotional support given Given to patient

## 2018-08-18 ENCOUNTER — Telehealth: Payer: Self-pay

## 2018-08-18 ENCOUNTER — Other Ambulatory Visit: Payer: Self-pay | Admitting: Family Medicine

## 2018-08-18 DIAGNOSIS — N186 End stage renal disease: Secondary | ICD-10-CM | POA: Diagnosis not present

## 2018-08-18 DIAGNOSIS — J40 Bronchitis, not specified as acute or chronic: Secondary | ICD-10-CM

## 2018-08-18 DIAGNOSIS — F411 Generalized anxiety disorder: Secondary | ICD-10-CM | POA: Diagnosis not present

## 2018-08-18 DIAGNOSIS — Z992 Dependence on renal dialysis: Secondary | ICD-10-CM | POA: Diagnosis not present

## 2018-08-18 DIAGNOSIS — R0602 Shortness of breath: Secondary | ICD-10-CM | POA: Diagnosis not present

## 2018-08-18 DIAGNOSIS — I5021 Acute systolic (congestive) heart failure: Secondary | ICD-10-CM | POA: Diagnosis not present

## 2018-08-18 DIAGNOSIS — F329 Major depressive disorder, single episode, unspecified: Secondary | ICD-10-CM | POA: Diagnosis not present

## 2018-08-18 DIAGNOSIS — G4700 Insomnia, unspecified: Secondary | ICD-10-CM | POA: Diagnosis not present

## 2018-08-18 LAB — BASIC METABOLIC PANEL
Anion gap: 11 (ref 5–15)
BUN: 37 mg/dL — ABNORMAL HIGH (ref 8–23)
CO2: 29 mmol/L (ref 22–32)
Calcium: 7.9 mg/dL — ABNORMAL LOW (ref 8.9–10.3)
Chloride: 95 mmol/L — ABNORMAL LOW (ref 98–111)
Creatinine, Ser: 3.28 mg/dL — ABNORMAL HIGH (ref 0.44–1.00)
GFR calc Af Amer: 17 mL/min — ABNORMAL LOW (ref >60)
GFR calc non Af Amer: 14 mL/min — ABNORMAL LOW (ref >60)
Glucose, Bld: 153 mg/dL — ABNORMAL HIGH (ref 70–99)
Potassium: 4.6 mmol/L (ref 3.5–5.1)
Sodium: 135 mmol/L (ref 135–145)

## 2018-08-18 LAB — CBC
HCT: 24.1 % — ABNORMAL LOW (ref 36.0–46.0)
Hemoglobin: 7.9 g/dL — ABNORMAL LOW (ref 12.0–15.0)
MCH: 35 pg — ABNORMAL HIGH (ref 26.0–34.0)
MCHC: 32.8 g/dL (ref 30.0–36.0)
MCV: 106.6 fL — ABNORMAL HIGH (ref 80.0–100.0)
Platelets: 57 10*3/uL — ABNORMAL LOW (ref 150–400)
RBC: 2.26 MIL/uL — ABNORMAL LOW (ref 3.87–5.11)
RDW: 24.3 % — ABNORMAL HIGH (ref 11.5–15.5)
WBC: 12.6 10*3/uL — ABNORMAL HIGH (ref 4.0–10.5)
nRBC: 6 % — ABNORMAL HIGH (ref 0.0–0.2)

## 2018-08-18 MED ORDER — ALBUTEROL SULFATE (2.5 MG/3ML) 0.083% IN NEBU: 2.5000 mg | INHALATION_SOLUTION | Freq: Four times a day (QID) | RESPIRATORY_TRACT | 2 refills | Status: DC | PRN

## 2018-08-18 MED ORDER — DULOXETINE HCL 30 MG PO CPEP: 30.0000 mg | ORAL_CAPSULE | Freq: Every day | ORAL | 1 refills | Status: DC

## 2018-08-18 MED ORDER — CLONAZEPAM 1 MG PO TABS: 1.0000 mg | ORAL_TABLET | Freq: Two times a day (BID) | ORAL | 0 refills | Status: DC

## 2018-08-18 MED ORDER — HYDRALAZINE HCL 25 MG PO TABS: 25.0000 mg | ORAL_TABLET | Freq: Three times a day (TID) | ORAL | 1 refills | Status: DC

## 2018-08-18 MED ORDER — ISOSORBIDE MONONITRATE ER 30 MG PO TB24: 30.0000 mg | ORAL_TABLET | Freq: Every day | ORAL | 2 refills | Status: DC

## 2018-08-18 MED ORDER — PREDNISONE 20 MG PO TABS: 40.0000 mg | ORAL_TABLET | Freq: Every day | ORAL | 0 refills | Status: AC

## 2018-08-18 MED ORDER — OXYCODONE-ACETAMINOPHEN 10-325 MG PO TABS: 1.0000 | ORAL_TABLET | Freq: Three times a day (TID) | ORAL | 0 refills | Status: DC | PRN

## 2018-08-18 MED ORDER — METOPROLOL SUCCINATE ER 50 MG PO TB24: 50.0000 mg | ORAL_TABLET | Freq: Every day | ORAL | 2 refills | Status: DC

## 2018-08-18 NOTE — Progress Notes (Signed)
Jessica Burgess to be D/C'd Home per MD order.  Discussed prescriptions and follow up appointments with the patient. Prescriptions given to patient, medication list explained in detail. Pt verbalized understanding.  Allergies as of 08/18/2018      Reactions   Chlorhexidine Itching   Pt reports itching this AM post wipes w/ CHG, no redness or irritation assessed      Medication List    STOP taking these medications   estradiol 0.5 MG tablet Commonly known as:  ESTRACE   gabapentin 300 MG capsule Commonly known as:  NEURONTIN   potassium gluconate 595 (99 K) MG Tabs tablet   temazepam 15 MG capsule Commonly known as:  RESTORIL   valACYclovir 1000 MG tablet Commonly known as:  VALTREX     TAKE these medications   albuterol (2.5 MG/3ML) 0.083% nebulizer solution Commonly known as:  PROVENTIL Take 3 mLs (2.5 mg total) by nebulization every 6 (six) hours as needed for wheezing or shortness of breath.   albuterol 108 (90 Base) MCG/ACT inhaler Commonly known as:  VENTOLIN HFA Inhale 2 puffs into the lungs every 6 (six) hours as needed for wheezing or shortness of breath.   B-12 PO Take 1 tablet by mouth daily.   budesonide-formoterol 160-4.5 MCG/ACT inhaler Commonly known as:  SYMBICORT Inhale 2 puffs into the lungs 2 (two) times daily.   clonazePAM 1 MG tablet Commonly known as:  KLONOPIN Take 1 tablet (1 mg total) by mouth 2 (two) times daily. Notes to patient:  Next dose due in afternoon 08/18/2018   cyclobenzaprine 10 MG tablet Commonly known as:  FLEXERIL Take one tablet every 8 hours as needed   DULoxetine 30 MG capsule Commonly known as:  CYMBALTA Take 1 capsule (30 mg total) by mouth daily. What changed:    medication strength  how much to take  how to take this  when to take this  additional instructions Notes to patient:  Next dose due 08/19/2018   FISH OIL PO Take 1 capsule by mouth 2 (two) times daily. Notes to patient:  08/19/2018   fluticasone 50  MCG/ACT nasal spray Commonly known as:  FLONASE Place 2 sprays into both nostrils as needed.   hydrALAZINE 25 MG tablet Commonly known as:  APRESOLINE Take 1 tablet (25 mg total) by mouth 3 (three) times daily. Notes to patient:  Next dose due this evening 08/18/2018   ipratropium 17 MCG/ACT inhaler Commonly known as:  ATROVENT HFA Inhale 2 puffs into the lungs every 6 (six) hours as needed.   Iron Supplement 325 (65 FE) MG tablet Generic drug:  ferrous sulfate Take 1 tablet by mouth daily. Notes to patient:  Next dose due 08/18/2018   isosorbide mononitrate 30 MG 24 hr tablet Commonly known as:  IMDUR Take 1 tablet (30 mg total) by mouth daily. Notes to patient:  Next dose due 08/19/2018   levothyroxine 25 MCG tablet Commonly known as:  Synthroid Take 1 tablet (25 mcg total) by mouth daily before breakfast. Notes to patient:  Next dose due 08/19/2018   lidocaine 2 % solution Commonly known as:  XYLOCAINE Use as directed 15 mLs in the mouth or throat as needed for mouth pain.   Lysine 1000 MG Tabs Take 1 tablet by mouth daily. Notes to patient:  Next dose due 08/19/2018   Magnesium Oxide 500 MG Caps Take 500 mg by mouth 2 (two) times daily. Notes to patient:  Next dose due 08/19/2018   metoprolol succinate 50  MG 24 hr tablet Commonly known as:  TOPROL-XL Take 1 tablet (50 mg total) by mouth daily. Take with or immediately following a meal. Notes to patient:  Next dose due 08/19/2018   MULTIVITAMIN PO Take 1 tablet by mouth daily. Notes to patient:  Next dose due 08/19/2018   ondansetron 8 MG disintegrating tablet Commonly known as:  ZOFRAN-ODT Take 1 tablet (8 mg total) by mouth every 8 (eight) hours as needed for nausea or vomiting.   oxyCODONE-acetaminophen 10-325 MG tablet Commonly known as:  Percocet Take 1 tablet by mouth every 8 (eight) hours as needed for pain. What changed:  when to take this   predniSONE 20 MG tablet Commonly known as:  Deltasone Take 2 tablets  (40 mg total) by mouth daily for 5 days. Notes to patient:  Next dose due 08/19/2018   SUMAtriptan 100 MG tablet Commonly known as:  IMITREX May repeat in 2 hours if headache persists or recurs.   VITAMIN C PO Take 1 tablet by mouth daily. Notes to patient:  Next dose due 08/19/2018   Vitamin D-1000 Max St 25 MCG (1000 UT) tablet Generic drug:  Cholecalciferol Take 1 tablet by mouth 2 (two) times daily. Notes to patient:  Next dose due 08/19/2018       Vitals:   08/18/18 0751 08/18/18 0809  BP:  (!) 153/88  Pulse:  80  Resp:    Temp:    SpO2: 98%     Skin clean, dry and intact without evidence of skin break down, no evidence of skin tears noted. IV catheter discontinued intact. Site without signs and symptoms of complications. Dressing and pressure applied. Pt denies pain at this time. No complaints noted.  An After Visit Summary was printed and given to the patient. Patient escorted via [place], and D/C home via private auto.  Fuller Mandril, RN

## 2018-08-18 NOTE — Progress Notes (Signed)
[facility]  ROUNDING NOTE   Subjective:   Denies acute complaints Doing well No shortness of breath Ambulatory in the room All packed up and ready for discharge    Objective:  Vital signs in last 24 hours:  Temp:  [97.9 F (36.6 C)-98.6 F (37 C)] 98.6 F (37 C) (06/02 0527) Pulse Rate:  [71-103] 80 (06/02 0809) Resp:  [14-29] 20 (06/02 0527) BP: (112-225)/(67-147) 153/88 (06/02 0809) SpO2:  [95 %-100 %] 99 % (06/02 0527) Weight:  [73.2 kg-74.8 kg] 73.2 kg (06/01 1430)  Weight change: 2.743 kg Filed Weights   08/17/18 0826 08/17/18 1010 08/17/18 1430  Weight: 74.8 kg 74.8 kg 73.2 kg    Intake/Output: I/O last 3 completed shifts: In: 52.5 [P.O.:30; I.V.:22.5] Out: 2100 [Urine:600; Other:1500]   Intake/Output this shift:  No intake/output data recorded.  Physical Exam: General: NAD, sitting up on bed  Head: Normocephalic, atraumatic. Moist oral mucosal membranes  Eyes: Anicteric,    Neck: Supple, trachea midline  Lungs:  Clear to auscultation   Heart: Regular rate and rhythm  Abdomen:  Soft, nontender,   Extremities:  no peripheral edema.  Neurologic: Sleepy but able to answer questions  Skin: No lesions  Access:  Left IJ PC (6/1) Dr Lucky Cowboy    Basic Metabolic Panel: Recent Labs  Lab 08/12/18 [phone] 08/13/18 0526 08/14/18 0336 08/15/18 0926 08/16/18 0422 08/17/18 0412 08/18/18 0023  NA 135 136 132* 136 137 136 135  K 4.0 3.7 4.0 4.0 4.0 4.0 4.6  CL 98 97* 96* 97* 98 97* 95*  CO2 27 28 22  21* 26 24 29   GLUCOSE 168* 95 150* 224* 106* 143* 153*  BUN 47* 53* 67* 89* 59* 87* 37*  CREATININE 3.36* 4.02* 5.02* 5.65* 3.89* 5.29* 3.28*  CALCIUM 8.3* 7.9* 8.0* 8.2* 7.8* 8.2* 7.9*  PHOS 3.9 4.4  --   --   --   --   --     Liver Function Tests: Recent Labs  Lab 08/12/18 0904 08/13/18 0526 08/15/18 0926  AST  --   --  38  ALT  --   --  19  ALKPHOS  --   --  61  BILITOT  --   --  1.4*  PROT  --   --  6.2*  ALBUMIN 3.1* 3.0* 3.3*   No  results for input(s): LIPASE, AMYLASE in the last 168 hours. No results for input(s): AMMONIA in the last 168 hours.  CBC: Recent Labs  Lab 08/14/18 1543 08/15/18 0533 08/16/18 0422 08/17/18 0412 08/18/18 0023  WBC 10.7* 11.9* 13.1* 15.5* 12.6*  NEUTROABS  --  9.2*  --   --   --   HGB 8.5* 8.0* 7.5* 7.5* 7.9*  HCT 25.5* 24.1* 23.4* 23.1* 24.1*  MCV 101.6* 102.6* 104.9* 104.5* 106.6*  PLT 124* 91* 68* 79* 57*    Cardiac Enzymes: No results for input(s): CKTOTAL, CKMB, CKMBINDEX, TROPONINI in the last 168 hours.  BNP: Invalid input(s): POCBNP  CBG: Recent Labs  Lab 08/14/18 1305  GLUCAP 185*    Microbiology: Results for orders placed or performed during the hospital encounter of 08/06/18  SARS Coronavirus 2 (CEPHEID - Performed in [place] hospital lab), Hosp Order     Status: None   Collection Time: 08/06/18  8:15 PM  Result Value Ref Range Status   SARS Coronavirus 2 NEGATIVE NEGATIVE Final    Comment: (NOTE) If result is NEGATIVE SARS-CoV-2 target nucleic acids are NOT DETECTED. The SARS-CoV-2 RNA is  generally detectable in upper and lower  respiratory specimens during the acute phase of infection. The lowest  concentration of SARS-CoV-2 viral copies this assay can detect is 250  copies / mL. A negative result does not preclude SARS-CoV-2 infection  and should not be used as the sole basis for treatment or other  patient management decisions.  A negative result may occur with  improper specimen collection / handling, submission of specimen other  than nasopharyngeal swab, presence of viral mutation(s) within the  areas targeted by this assay, and inadequate number of viral copies  (<250 copies / mL). A negative result must be combined with clinical  observations, patient history, and epidemiological information. If result is POSITIVE SARS-CoV-2 target nucleic acids are DETECTED. The SARS-CoV-2 RNA is generally detectable in upper and lower  respiratory  specimens dur ing the acute phase of infection.  Positive  results are indicative of active infection with SARS-CoV-2.  Clinical  correlation with patient history and other diagnostic information is  necessary to determine patient infection status.  Positive results do  not rule out bacterial infection or co-infection with other viruses. If result is PRESUMPTIVE POSTIVE SARS-CoV-2 nucleic acids MAY BE PRESENT.   A presumptive positive result was obtained on the submitted specimen  and confirmed on repeat testing.  While 2019 novel coronavirus  (SARS-CoV-2) nucleic acids may be present in the submitted sample  additional confirmatory testing may be necessary for epidemiological  and / or clinical management purposes  to differentiate between  SARS-CoV-2 and other Sarbecovirus currently known to infect humans.  If clinically indicated additional testing with an alternate test  methodology [phone]) is advised. The SARS-CoV-2 RNA is generally  detectable in upper and lower respiratory sp ecimens during the acute  phase of infection. The expected result is Negative. Fact Sheet for Patients:  StrictlyIdeas.no Fact Sheet for Healthcare Providers: BankingDealers.co.za This test is not yet approved or cleared by the [place] FDA and has been authorized for detection and/or diagnosis of SARS-CoV-2 by FDA under an Emergency Use Authorization (EUA).  This EUA will remain in effect (meaning this test can be used) for the duration of the COVID-19 declaration under Section 564(b)(1) of the Act, 21 U.S.C. section 360bbb-3(b)(1), unless the authorization is terminated or revoked sooner. Performed at [facility], [address] [place], [place] [address]   MRSA PCR Screening     Status: None   Collection Time: 08/07/18  2:38 AM  Result Value Ref Range Status   MRSA by PCR NEGATIVE NEGATIVE Final    Comment:        The GeneXpert MRSA  Assay (FDA approved for NASAL specimens only), is one component of a comprehensive MRSA colonization surveillance program. It is not intended to diagnose MRSA infection nor to guide or monitor treatment for MRSA infections. Performed at [facility], [address] [place], [place] [address]   CULTURE, BLOOD (ROUTINE X 2) w Reflex to ID Panel     Status: None   Collection Time: 08/07/18  3:40 AM  Result Value Ref Range Status   Specimen Description BLOOD LEFT ANTECUBITAL  Final   Special Requests   Final    BOTTLES DRAWN AEROBIC AND ANAEROBIC Blood Culture results may not be optimal due to an excessive volume of blood received in culture bottles   Culture   Final    NO GROWTH 5 DAYS Performed at [facility], [address] [place], [place] [address]    Report  Status 08/12/2018 FINAL  Final  CULTURE, BLOOD (ROUTINE X 2) w Reflex to ID Panel     Status: None   Collection Time: 08/07/18  3:40 AM  Result Value Ref Range Status   Specimen Description BLOOD BLOOD LEFT HAND  Final   Special Requests   Final    BOTTLES DRAWN AEROBIC AND ANAEROBIC Blood Culture results may not be optimal due to an excessive volume of blood received in culture bottles   Culture   Final    NO GROWTH 5 DAYS Performed at [facility], [address] [place], [place] [address]    Report Status 08/12/2018 FINAL  Final  Urine Culture     Status: None   Collection Time: 08/08/18  2:10 AM  Result Value Ref Range Status   Specimen Description   Final    URINE, RANDOM Performed at [facility], [address], [place], [place] [address]    Special Requests   Final    Normal Performed at [facility], [address], [place], [place] [address]    Culture   Final    NO GROWTH Performed at [facility], [place] [address], [place],  [address]    Report Status 08/09/2018 FINAL  Final    Coagulation Studies: No results for input(s): LABPROT,  INR in the last 72 hours.  Urinalysis: No results for input(s): COLORURINE, LABSPEC, PHURINE, GLUCOSEU, HGBUR, BILIRUBINUR, KETONESUR, PROTEINUR, UROBILINOGEN, NITRITE, LEUKOCYTESUR in the last 72 hours.  Invalid input(s): APPERANCEUR    Imaging: No results found.   Medications:   . sodium chloride Stopped (08/17/18 1523)   . sodium chloride   Intravenous Once  . atorvastatin  40 mg Oral q1800  . Chlorhexidine Gluconate Cloth  6 each Topical Q0600  . clonazePAM  1 mg Oral BID  . DULoxetine  30 mg Oral Daily  . epoetin (EPOGEN/PROCRIT) injection  10,000 Units Intravenous Q T,Th,Sa-HD  . feeding supplement (NEPRO CARB STEADY)  237 mL Oral BID BM  . ferrous sulfate  325 mg Oral Daily  . hydrALAZINE  25 mg Oral TID  . ipratropium-albuterol  3 mL Nebulization Q6H  . isosorbide mononitrate  30 mg Oral Daily  . levothyroxine  25 mcg Oral Q0600  . methylPREDNISolone (SOLU-MEDROL) injection  60 mg Intravenous Daily  . metoprolol succinate  50 mg Oral Daily  . mometasone-formoterol  2 puff Inhalation BID  . multivitamin  1 tablet Oral QHS  . sodium chloride flush  10 mL Intravenous Q12H  . sodium chloride flush  3 mL Intravenous Q12H   sodium chloride, acetaminophen, albuterol, cyclobenzaprine, fluticasone, hydrALAZINE, labetalol, lidocaine, morphine injection, ondansetron (ZOFRAN) IV, [DISCONTINUED] oxyCODONE-acetaminophen **AND** oxyCODONE, Racepinephrine HCl, sodium chloride flush, SUMAtriptan, zolpidem  Assessment/ Plan:  Ms. DALAYZA ZAMBRANA is a 63 y.o. white female with history ofovarian cancer stage III,GERD, migraine headaches, cervical spinal stenosis,who was admitted to [facility] on5/21/2020for evaluation of significant shortness of breath.   1. Acute renal failure on chronic kidney disease stage III with proteinuria baseline creatinine 1.7, GFR of 30 on 07/27/18.   Chronic kidney disease cause seems unclear.  Acute renal failure is thought to be secondary to acute  cardiorenal syndrome.  Serologic work up with ANA positive but reflex antibodies nonreactive.  First treatment on 5/25.  Tunneled catheter placed 6/1 Outpatient HD at [facility] 6 PM Okay to start Thursday Evaluate for twice a week dialysis as outpatient  2.  Anemia with acute renal failure:  macrocytic. PRBC transfusion 5/28.  Lab Results  Component Value Date   HGB 7.9 (L) 08/18/2018    EPO with HD treatments. Dr. Grayland Ormond cleared use of EPO.   3. Acute respiratory failure: secondary to acute exacerbation of congestive heart failure and volume overload Improved with ultrafiltration with hemodialysis.  Follow low-salt diet   LOS: 12 Jessica Burgess 6/2/20209:06 AM

## 2018-08-18 NOTE — Consult Note (Signed)
[facility] Face-to-Face Psychiatry Consult follow-up  Reason for Consult:  Anxiety Referring Physician:  Dr. Posey Pronto Patient Identification: Jessica Burgess MRN:  [id number] Principal Diagnosis: ESRD on dialysis [facility]) Diagnosis:  Principal Problem:   ESRD on dialysis [facility]) Active Problems:   Insomnia, persistent   Major depression, chronic ([place])   Generalized anxiety disorder   Status post insertion of dialysis catheter ([place])   Acute CHF ([place])   Acute CHF (congestive heart failure) ([place])   Anxiety   Acute respiratory failure ([place])  Patient is seen, chart is reviewed, collateral and communication with nursing regarding patient having increased anxiety in the morning.  Total Time spent with patient: 35 minutes  Subjective: "I feel ready to get out of here."  HPI:  Jessica Burgess is a 63 y.o. female patient with a known history of ovarian cancer, CKD, hypothyroidism, hypertension and anemia.  Patient is currently undergoing chemotherapy followed by Dr. Mellody Drown and Dr. Grayland Ormond.  Patient reports having had chemotherapy infusion on yesterday however over the last 3 days she has received infusion of packed red blood cells as well as 2 L IV fluids according to the patient report.  She now presents to the emergency room with a 2-day history of increased shortness of breath and nonproductive cough.  Shortness of breath is made worse when lying flat.  She denies edema.  She denies chest pain.  She denies abdominal pain.  Patient denies fevers, chills, nausea, vomiting, or diarrhea.  Patient denies a prior known history of CHF.  However she is aware of renal failure occurring since March 2020.  CT abdomen was completed which demonstrated no evidence of hydronephrosis or urinary stone disease.  Chest x-ray demonstrated no significant interval change in cardiomegaly with mild vascular congestion and diffuse interstitial opacity.  No pleural effusion or pneumothorax identified.  However, patient  developed increased shortness of breath while in the emergency room and was therefore placed on BiPAP therapy to decrease work of breathing.  Psychiatry consult is requested for evaluation and assistance with management of anxiety.  08/11/2018: On evaluation, patient is calm and cooperative.  She is alert and oriented x4.  Patient describes her mood as stable, with ups and downs related to her cancer diagnosis in 2018.  She expresses that she has developed congestive heart failure and recently renal failure, "secondary to bad judgment.  I should have gotten the blood transfusion, but should not have gotten the extra fluids that worsened my heart failure."  Patient expresses usual level of anxiety while hospitalized.  She states that she has more anxiety at home.  Patient lives with a roommate, and she describes increasing anxiety about 1 hour before when room-mate comes home because her room-mate complains about work not getting done at the house.  Patient reports that she is "happy" in the morning and through the day.  She states she falls asleep easily with Restoril, but it  wears off at 3 AM.  Patient denies SI, HI, AVH.  She denies history of mania or psychosis.  08/12/2018: On evaluation, patient is laying in bed with migraine pain.  She reports that Klonopin that she received this morning was helpful in managing her anxiety, however she feels sedated and dizzy following the 1 mg dose.  She reports that she slept well last night.  Review of records note that she did not require additional Restoril.  Patient is agreeable to low-dose Klonopin twice daily to manage chronic anxiety related to her multiple medical comorbidities.  Patient expresses nausea today with migraine, however reports her appetite is unchanged.  She is denying SI, HI, AVH.  08/14/18: On evaluation, patient is lying in bed with her CPAP machine, resting quietly with her eyes closed prior to assessment.  Her sitter, a Therapist, sports, who has been  with her reports her anxiety has definitely improved with better toleration of dialysis.  She was concerned about the patient being "more sleepy".  When this provider suggested decreasing the dose, the patient said she "liked it".  Reports her anxiety is much better and prefers the current dose, no changes made.  08/15/18: Patient lying in bed with CPAP in place, some anxiety but reports it is improved with the Klonopin.  Denies any other issues. Sleep was "good" with no nausea or headache today.    08/15/18: Patient sitting up in the recliner calmly eating ice watching television.  Reports her anxiety is "better", denies depression.  PICC line in place on left side of her neck but no breathing machine.  Mental and physical states have significantly improved. Smiles and thanks this provider when this is pointed out.    08/18/2018 Patient is awake and alert.  She is dressed and sitting at her bedside.  Patient reports that Klonopin has been significantly helpful in improving her anxiety.  She still is perseverative about "what could have happened that caused this fluid overload."  Support is provided for patient to continue to move forward in her treatment.  Patient also expresses frustration about her new central line, and is concerned that she "cannot go swimming in my own pool, since I cannot get this wet."  Patient is encouraged to speak with her providers about water safety with her central line.  Patient states that she is active in psychotherapy with a counselor provided to her by the cancer center.  Patient does not wish to have appointments with an outpatient psychiatrist at this time.  Patient denies SI, HI, AVH.  She is able to contract for safety and is aware of resources to call for crisis.  Past Psychiatric History: Depression, anxiety, insomnia  Risk to Self:  no Risk to Others:  no Prior Inpatient Therapy:  none Prior Outpatient Therapy:  none  Past Medical History:  Past Medical  History:  Diagnosis Date  . Allergic rhinitis, cause unspecified   . Anxiety state, unspecified   . Arthritis   . Asthma    only when sick   . Backache, unspecified   . Bronchitis    hx of when get sick  . Cancer ([place])    skin cancer , basal cell   . Cancer ([place]) 11/2016   ovarian  . Cervicalgia   . Complication of anesthesia   . Dermatophytosis of nail   . Dysmetabolic syndrome X   . Encounter for long-term (current) use of other medications   . Esophageal reflux   . Insomnia, unspecified   . Leukocytosis, unspecified   . Migraine without aura, without mention of intractable migraine without mention of status migrainosus   . Other and unspecified hyperlipidemia   . Other malaise and fatigue   . Overweight(278.02)   . Personal history of chemotherapy now   ovarian  . PONV (postoperative nausea and vomiting)   . Spinal stenosis in cervical region   . Symptomatic menopausal or female climacteric states   . Unspecified disorder of skin and subcutaneous tissue   . Unspecified vitamin D deficiency     Past Surgical History:  Procedure Laterality  Date  . ABDOMINAL HYSTERECTOMY    . ANTERIOR CERVICAL DECOMP/DISCECTOMY FUSION N/A 06/07/2015   Procedure: Cervical three - four and Cervical six- seven anterior cervical decompression with fusion interbody prosthesis plating and bonegraft;  Surgeon: Newman Pies, MD;  Location: [place] [facility] OR[facility];  Service: Neurosurgery;  Laterality: N/A;  C34 and C67 anterior cervical decompression with fusion interbody prosthesis plating and bonegraft  . BACK SURGERY     x2 Lower   . DIALYSIS/PERMA CATHETER INSERTION N/A 08/17/2018   Procedure: DIALYSIS/PERMA CATHETER INSERTION;  Surgeon: Algernon Huxley, MD;  Location: [place] CV LAB;  Service: Cardiovascular;  Laterality: N/A;  . EVACUATION OF CERVICAL HEMATOMA N/A 06/14/2015   Procedure: EVACUATION OF CERVICAL HEMATOMA;  Surgeon: Newman Pies, MD;  Location: [place] NEURO ORS;  Service: Neurosurgery;   Laterality: N/A;  . NECK SURGERY     x3  . TONSILLECTOMY     Family History:  Family History  Problem Relation Age of Onset  . Depression Mother   . Migraines Mother   . Dementia Father   . Diabetes Father   . Hyperlipidemia Father   . Hyperlipidemia Brother   . Hyperlipidemia Brother   . Breast cancer Paternal Aunt        5s   Family Psychiatric  History: "a lot on my mother's side - depression" No suicides   Social History:  Social History   Substance and Sexual Activity  Alcohol Use Not Currently  . Alcohol/week: 0.0 standard drinks     Social History   Substance and Sexual Activity  Drug Use No    Social History   Socioeconomic History  . Marital status: Single    Spouse name: Not on file  . Number of children: 0  . Years of education: some college  . Highest education level: 12th grade  Occupational History    Employer: DISABLED  . Occupation: Disabled   Social Needs  . Financial resource strain: Very hard  . Food insecurity:    Worry: Never true    Inability: Never true  . Transportation needs:    Medical: No    Non-medical: No  Tobacco Use  . Smoking status: Former Smoker    Packs/day: 1.50    Years: 20.00    Pack years: 30.00    Types: Cigarettes    Start date: 03/19/1979    Last attempt to quit: 08/25/1999    Years since quitting: 18.9  . Smokeless tobacco: Never Used  . Tobacco comment: smoking cessation materials not required  Substance and Sexual Activity  . Alcohol use: Not Currently    Alcohol/week: 0.0 standard drinks  . Drug use: No  . Sexual activity: Never  Lifestyle  . Physical activity:    Days per week: 0 days    Minutes per session: 0 min  . Stress: Very much  Relationships  . Social connections:    Talks on phone: Patient refused    Gets together: Patient refused    Attends religious service: Patient refused    Active member of club or organization: Patient refused    Attends meetings of clubs or organizations:  Patient refused    Relationship status: Patient refused  Other Topics Concern  . Not on file  Social History Narrative   Patient is single.    Patient lives with roommates.    Patient on disability    Patient has no children.    Patient has some college    Additional Social History:  Living with a room-mate Independent with her care. 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 Chlorhexidine Itching    Pt reports itching this AM post wipes w/ CHG, no redness or irritation assessed    Labs:  Results for orders placed or performed during the hospital encounter of 08/06/18 (from the past 48 hour(s))  Basic metabolic panel     Status: Abnormal   Collection Time: 08/17/18  4:12 AM  Result Value Ref Range   Sodium 136 135 - 145 mmol/L   Potassium 4.0 3.5 - 5.1 mmol/L   Chloride 97 (L) 98 - 111 mmol/L   CO2 24 22 - 32 mmol/L   Glucose, Bld 143 (H) 70 - 99 mg/dL   BUN 87 (H) 8 - 23 mg/dL   Creatinine, Ser 5.29 (H) 0.44 - 1.00 mg/dL   Calcium 8.2 (L) 8.9 - 10.3 mg/dL   GFR calc non Af Amer 8 (L) >60 mL/min   GFR calc Af Amer 9 (L) >60 mL/min   Anion gap 15 5 - 15    Comment: Performed at [facility], [address] [place], [place] [address]  CBC     Status: Abnormal   Collection Time: 08/17/18  4:12 AM  Result Value Ref Range   WBC 15.5 (H) 4.0 - 10.5 K/uL   RBC 2.21 (L) 3.87 - 5.11 MIL/uL   Hemoglobin 7.5 (L) 12.0 - 15.0 g/dL   HCT 23.1 (L) 36.0 - 46.0 %   MCV 104.5 (H) 80.0 - 100.0 fL   MCH 33.9 26.0 - 34.0 pg   MCHC 32.5 30.0 - 36.0 g/dL   RDW 23.8 (H) 11.5 - 15.5 %   Platelets 79 (L) 150 - 400 K/uL    Comment: Immature Platelet Fraction may be clinically indicated, consider ordering this additional test [id number]     nRBC 6.2 (H) 0.0 - 0.2 %    Comment: Performed at [facility], [address], [place], [place] [address]  Pathologist smear review     Status: None   Collection Time: 08/17/18  4:12 AM  Result Value Ref Range   Path Review Peripheral blood smear is reviewed.     Comment: Absolute leukocytosis with neutrophilia and mildly left shifted maturation. Macrocytic anemia with moderate anisopoikilocytosis. Helmet cells and few RBC fragments. Polychromasia and nucleated RBCs. Thrombocytopenia with unremarkable morphology. No evidence of platelet clumping or satellitism. Reviewed by Kathi Simpers, M.D. Performed at [facility], [address] [place], [place] [address]   CBC     Status: Abnormal   Collection Time: 08/18/18 12:23 AM  Result Value Ref Range   WBC 12.6 (H) 4.0 - 10.5 K/uL    Comment: WHITE COUNT CONFIRMED ON SMEAR   RBC 2.26 (L) 3.87 - 5.11 MIL/uL   Hemoglobin 7.9 (L) 12.0 - 15.0 g/dL   HCT 24.1 (L) 36.0 - 46.0 %   MCV 106.6 (H) 80.0 - 100.0 fL   MCH 35.0 (H) 26.0 - 34.0 pg   MCHC 32.8 30.0 - 36.0 g/dL   RDW 24.3 (H) 11.5 - 15.5 %   Platelets 57 (L) 150 - 400 K/uL    Comment: Immature Platelet Fraction may be clinically indicated, consider ordering this additional test [id number] CONSISTENT  WITH PREVIOUS RESULT    nRBC 6.0 (H) 0.0 - 0.2 %    Comment: Performed at [facility], [address] [place], [place] [address]  Basic metabolic panel     Status: Abnormal   Collection Time: 08/18/18 12:23 AM  Result Value Ref Range   Sodium 135 135 - 145 mmol/L   Potassium 4.6 3.5 - 5.1 mmol/L   Chloride 95 (L) 98 - 111 mmol/L   CO2 29 22 - 32 mmol/L   Glucose, Bld 153 (H) 70 - 99 mg/dL   BUN 37 (H) 8 - 23 mg/dL   Creatinine, Ser 3.28 (H) 0.44 - 1.00 mg/dL   Calcium 7.9 (L) 8.9 - 10.3 mg/dL   GFR calc non Af Amer 14 (L) >60 mL/min   GFR calc Af Amer 17 (L) >60 mL/min   Anion gap 11 5 - 15    Comment: Performed at [facility], [address] [place], [place] [address]    Current Facility-Administered Medications  Medication Dose Route Frequency Provider Last Rate Last Dose  . 0.9 %  sodium chloride infusion (Manually program via Guardrails IV Fluids)   Intravenous Once Algernon Huxley, MD   Stopped at 08/13/18 1206  . 0.9 %  sodium chloride infusion  250 mL Intravenous PRN Algernon Huxley, MD   Stopped at 08/17/18 1523  . acetaminophen (TYLENOL) tablet 650 mg  650 mg Oral Q4H PRN Algernon Huxley, MD   650 mg at 08/15/18 1516  . albuterol (PROVENTIL) (2.5 MG/3ML) 0.083% nebulizer solution 2.5 mg  2.5 mg Nebulization Q2H PRN Algernon Huxley, MD      . atorvastatin (LIPITOR) tablet 40 mg  40 mg Oral q1800 Algernon Huxley, MD   40 mg at 08/17/18 1848  . Chlorhexidine Gluconate Cloth 2 % PADS 6 each  6 each Topical Q0600 Algernon Huxley, MD   6 each at 08/17/18 [phone]  . clonazePAM (KLONOPIN) tablet 1 mg  1 mg Oral BID Algernon Huxley, MD   1 mg at 08/17/18 2129  . cyclobenzaprine (FLEXERIL) tablet 10 mg  10 mg Oral TID PRN Algernon Huxley, MD      . DULoxetine (CYMBALTA) DR capsule 30 mg  30 mg Oral Daily Algernon Huxley, MD   30 mg at 08/16/18 0915  . epoetin alfa (EPOGEN) injection 10,000 Units  10,000 Units Intravenous Q T,Th,Sa-HD Algernon Huxley, MD   10,000 Units at 08/17/18 1453  . feeding supplement (NEPRO CARB STEADY) liquid 237 mL  237 mL Oral BID BM Algernon Huxley, MD   237 mL at 08/17/18 1508  . ferrous sulfate tablet 325 mg  325 mg Oral Daily Algernon Huxley, MD   325 mg at 08/16/18 0915  . fluticasone (FLONASE) 50 MCG/ACT nasal spray 2 spray  2 spray Each Nare PRN Algernon Huxley, MD   2 spray at 08/12/18 [phone]  . hydrALAZINE (APRESOLINE) injection 10-20 mg  10-20 mg Intravenous Q6H PRN Algernon Huxley, MD   10 mg at 08/17/18 0608  . hydrALAZINE (APRESOLINE) tablet 25 mg  25 mg Oral TID Algernon Huxley, MD   25 mg at 08/17/18 2008  . ipratropium-albuterol (DUONEB) 0.5-2.5 (3) MG/3ML nebulizer solution 3 mL  3 mL Nebulization Q6H Algernon Huxley, MD   3 mL at  08/18/18 0751  . isosorbide mononitrate (IMDUR) 24 hr tablet 30 mg  30 mg Oral Daily Dew, Erskine Squibb, MD   30  mg at 08/16/18 0915  . labetalol (NORMODYNE) injection 20 mg  20 mg Intravenous Q6H PRN Algernon Huxley, MD   20 mg at 08/18/18 0646  . levothyroxine (SYNTHROID) tablet 25 mcg  25 mcg Oral Q0600 Algernon Huxley, MD   25 mcg at 08/18/18 0602  . lidocaine (XYLOCAINE) 2 % viscous mouth solution 15 mL  15 mL Mouth/Throat PRN Algernon Huxley, MD   15 mL at 08/07/18 1636  . methylPREDNISolone sodium succinate (SOLU-MEDROL) 125 mg/2 mL injection 60 mg  60 mg Intravenous Daily Algernon Huxley, MD   60 mg at 08/17/18 1501  . metoprolol succinate (TOPROL-XL) 24 hr tablet 50 mg  50 mg Oral Daily Algernon Huxley, MD   50 mg at 08/16/18 0915  . mometasone-formoterol (DULERA) 200-5 MCG/ACT inhaler 2 puff  2 puff Inhalation BID Algernon Huxley, MD   2 puff at 08/17/18 2131  . morphine 2 MG/ML injection 2-3 mg  2-3 mg Intravenous Q6H PRN Gladstone Lighter, MD   2 mg at 08/17/18 1838  . multivitamin (RENA-VIT) tablet 1 tablet  1 tablet Oral QHS Algernon Huxley, MD   1 tablet at 08/17/18 2129  . ondansetron (ZOFRAN) injection 4 mg  4 mg Intravenous Q6H PRN Algernon Huxley, MD      . oxyCODONE (Oxy IR/ROXICODONE) immediate release tablet 5 mg  5 mg Oral Q6H PRN Algernon Huxley, MD   5 mg at 08/17/18 1455  . Racepinephrine HCl 2.25 % nebulizer solution 0.5 mL  0.5 mL Nebulization Q4H PRN Algernon Huxley, MD   0.5 mL at 08/14/18 1335  . sodium chloride flush (NS) 0.9 % injection 10 mL  10 mL Intravenous Q12H Algernon Huxley, MD   10 mL at 08/17/18 2132  . sodium chloride flush (NS) 0.9 % injection 3 mL  3 mL Intravenous Q12H Algernon Huxley, MD   3 mL at 08/17/18 2132  . sodium chloride flush (NS) 0.9 % injection 3 mL  3 mL Intravenous PRN Algernon Huxley, MD      . SUMAtriptan (IMITREX) tablet 100 mg  100 mg Oral TID PRN Algernon Huxley, MD   100 mg at 08/14/18 0020  . zolpidem (AMBIEN) tablet 5 mg  5 mg Oral QHS PRN Algernon Huxley, MD   5 mg at 08/16/18  2126   Facility-Administered Medications Ordered in Other Encounters  Medication Dose Route Frequency Provider Last Rate Last Dose  . 0.9 %  sodium chloride infusion   Intravenous Once Lloyd Huger, MD      . 0.9 %  sodium chloride infusion   Intravenous Once Lloyd Huger, MD      . dexamethasone (DECADRON) 20 mg in sodium chloride 0.9 % 50 mL IVPB  20 mg Intravenous Once Lloyd Huger, MD      . dexamethasone (DECADRON) injection 10 mg  10 mg Intravenous Once Lloyd Huger, MD      . sodium chloride flush (NS) 0.9 % injection 10 mL  10 mL Intravenous PRN Lloyd Huger, MD   10 mL at 06/16/17 0093    Musculoskeletal: Strength & Muscle Tone: within normal limits Gait & Station: not assessed Patient leans: N/A  Psychiatric Specialty Exam: Physical Exam  Nursing note and vitals reviewed. Constitutional: She is oriented to person, place, and time. She appears well-developed and well-nourished. No distress.  HENT:  Head: Normocephalic and atraumatic.  Eyes: EOM are normal.  Neck: Normal  range of motion.  Cardiovascular: Normal rate and regular rhythm.  Respiratory: Effort normal. No respiratory distress.  Musculoskeletal: Normal range of motion.  Neurological: She is alert and oriented to person, place, and time.  Psychiatric: Her speech is normal and behavior is normal. Judgment and thought content normal. Her mood appears anxious. Cognition and memory are normal.    Review of Systems  Constitutional: Positive for malaise/fatigue.  Respiratory: Negative.   Cardiovascular: Negative.   Musculoskeletal: Negative.   Neurological: Positive for weakness.  Psychiatric/Behavioral: Negative for hallucinations, memory loss, substance abuse and suicidal ideas. The patient is nervous/anxious.     Blood pressure (!) 153/88, pulse 80, temperature 98.6 F (37 C), temperature source Oral, resp. rate 20, height 5\' 2"  (1.575 m), weight 73.2 kg, SpO2 98 %.Body mass  index is 29.52 kg/m.  General Appearance: Casual   Eye Contact:  Fair  Speech:  Clear and Coherent and Normal Rate  Volume:  Normal  Mood:  Anxious, mild  Affect:  Congruent  Thought Process:  Coherent, Linear and Descriptions of Associations: Intact  Orientation:  Full (Time, Place, and Person)  Thought Content:  Logical and Hallucinations: None  Suicidal Thoughts:  No  Homicidal Thoughts:  No  Memory:  Immediate;   Good Recent;   Good Remote;   Good  Judgement:  Good  Insight:  Good  Psychomotor Activity:  Normal  Concentration:  Concentration: Good  Recall:  Good  Fund of Knowledge:  Good  Language:  Good  Akathisia:  No  Handed:  Right  AIMS (if indicated):     Assets:  Communication Skills Desire for Improvement Financial Resources/Insurance Housing Resilience Social Support  ADL's:  Intact  Cognition:  WNL  Sleep:   Slept well overnight     Treatment Plan Summary: Daily contact with patient to assess and evaluate symptoms and progress in treatment and Medication management Major depression, chronic and Anxiety: -Continue Cymbalta 60 mg daily as this appears to be effective in treating patient's depression. -Continue Klonopin 1 mg twice daily.   These medications can be prescribed by outpatient oncology provider.  Disposition: Supportive therapy provided about ongoing stressors. Discussed crisis plan, support from social network, calling 911, coming to the Emergency Department, and calling Suicide Hotline.   She was able to engage in safety planning including plan to return to nearest emergency room or contact emergency services if she feels unable to maintain her own safety or the safety of others. Patient had no further questions, comments, or concerns.  Discharge into care of roommate.    Lavella Hammock, MD 08/18/2018 10:58 AM

## 2018-08-18 NOTE — Progress Notes (Signed)
Patient accepted at [facility] 6:15 chair time. Patient will be transported by [facility]/friend.

## 2018-08-18 NOTE — Progress Notes (Signed)
Patient was able to sit up in a chair for 3 hours without any issues. Patient is awaiting discharge.   Fuller Mandril, RN

## 2018-08-18 NOTE — TOC Transition Note (Signed)
Transition of Care [facility]) - CM/SW Discharge Note   Patient Details  Name: Jessica Burgess MRN: [id number] Date of Birth: October 09, 1955  [facility] of [facility]) CM/SW Contact:  Beverly Sessions, RN Phone Number: 08/18/2018, 11:25 AM   Clinical Narrative:    Patient to discharge home today.  PT has recommended home health.  Patient adamantly declines home health.  Bedside RN notified.  Plan for patient to start outpatient HD Thursday.  States that her friend will be transporting her to HD.  Elvera Bicker dialysis liaison notified of discharge.  Per bedside RN patient has tolerated sitting in her room for the required amount of time.  RN to document.  Patient has also been ambulatory around nursing station.    Final next level of care: Home/Self Care Barriers to Discharge: Barriers Resolved   Patient Goals and CMS Choice Patient states their goals for this hospitalization and ongoing recovery are:: Wants to go home tomorrow      Discharge Placement                       Discharge Plan and Services   Discharge Planning Services: CM Consult                      HH Arranged: Refused [facility]          Social Determinants of Health (SDOH) Interventions     Readmission Risk Interventions Readmission Risk Prevention Plan 08/18/2018 08/10/2018  Transportation Screening Complete Complete  HRI or [place] - Complete  Medication Review Press [employer]) Complete -  PCP or Specialist appointment within 3-5 days of discharge Not Complete -  [place] or Home Care Consult Patient refused -  SW Recovery Care/Counseling Consult Not Complete -  SW Consult Not Complete Comments not indicated -  Palliative Care Screening Not Applicable -  [place] Not Applicable -  Some recent data might be hidden

## 2018-08-18 NOTE — Discharge Summary (Signed)
[facility] [place] NAME: Jessica Burgess    MR#:  [id number]  DATE OF BIRTH:  October 05, 1955  DATE OF ADMISSION:  08/06/2018   ADMITTING PHYSICIAN: Christel Mormon, MD  DATE OF DISCHARGE:  08/18/18  PRIMARY CARE PHYSICIAN: Steele Sizer, MD   ADMISSION DIAGNOSIS:   Acute CHF ([place]) [I50.9] Elevated d-dimer [R79.89] Dyspnea, unspecified type [R06.00] Chronic renal failure, unspecified CKD stage [N18.9] Congestive heart failure, unspecified HF chronicity, unspecified heart failure type (HCC) [I50.9] Acute CHF (congestive heart failure) ([place]) [I50.9]  DISCHARGE DIAGNOSIS:   Principal Problem:   ESRD on dialysis [facility]) Active Problems:   Insomnia, persistent   Major depression, chronic (HCC)   Generalized anxiety disorder   Status post insertion of dialysis catheter (HCC)   Acute CHF ([place])   Acute CHF (congestive heart failure) (HCC)   Anxiety   Acute respiratory failure ([place])   SECONDARY DIAGNOSIS:   Past Medical History:  Diagnosis Date   Allergic rhinitis, cause unspecified    Anxiety state, unspecified    Arthritis    Asthma    only when sick    Backache, unspecified    Bronchitis    hx of when get sick   Cancer [facility])    skin cancer , basal cell    Cancer ([place]) 11/2016   ovarian   Cervicalgia    Complication of anesthesia    Dermatophytosis of nail    Dysmetabolic syndrome X    Encounter for long-term (current) use of other medications    Esophageal reflux    Insomnia, unspecified    Leukocytosis, unspecified    Migraine without aura, without mention of intractable migraine without mention of status migrainosus    Other and unspecified hyperlipidemia    Other malaise and fatigue    Overweight(278.02)    Personal history of chemotherapy now   ovarian   PONV (postoperative nausea and vomiting)    Spinal stenosis in cervical region    Symptomatic menopausal or female climacteric states     Unspecified disorder of skin and subcutaneous tissue    Unspecified vitamin D deficiency     HOSPITAL COURSE:   63 year old female with past medical history significant for ovarian cancer, GERD, migraine, CKD stage III, cervical spinal stenosis presented to hospital secondary to worsening shortness of breath.  1.  Acute hypoxic respiratory failure-not on home oxygen.  Secondary to CHF exacerbation and volume overload -off Lasix drip.  Patient receiving hemodialysis -Echocardiogram with EF of 40 to 45% and increased LVH. -Continue strict input and output monitoring and oxygen support as tolerated.  -required ICU transfer for BiPAP this hospital stay,   Currently off of oxygen and on room air now breathing comfortably and ambulating well. -Oral Lasix can be started as outpatient if needed.  2.  Acute renal failure on CKD stage III- started on hemodialysis this admission  - - cardiorenal syndrome.  Baseline creatinine at 1.7 -We will need dialysis in the short-term for now.  Permacath has been placed today. -  Currently on dialysis that was started on 08/10/2018. -Appreciate nephrology input.   -Outpatient dialysis has been set up for Tuesday, Thursday and Saturday schedule.  Last dialysis was yesterday on 08/17/2018.  Continue outpatient follow-up  3.  Hypothyroidism-continue Synthroid  4.  Anemia of chronic disease-continue to monitor hemoglobin.  Received transfusion prior to admission at the cancer center. -Also on Procrit.  Hemoglobin down to 7.5.  Transfuse if less than 7  5.  High-grade ovarian cancer-status post chemotherapy.  Appreciate oncology input  6.  Anxiety-appreciate psychiatry consult.  Continue Cymbalta and Klonopin for now. cymbalta dose adjusted for renal disease  7.  Hypertension- continue hydralazine, Imdur and metoprolol  8.  Acute on chronic COPD exacerbation-on IV steroids- change to oral steroids at discharge today.  Continue inhalers and nebulizers and  monitor.   Physical therapy has been consulted.  Patient is independent at baseline.  Lives at home with her sister -Discharged today  DISCHARGE CONDITIONS:   Guarded  CONSULTS OBTAINED:   Treatment Team:  Everitt Amber, MD Anthonette Legato, MD Lloyd Huger, MD Lavella Hammock, MD  DRUG ALLERGIES:   Allergies  Allergen Reactions   Chlorhexidine Itching    Pt reports itching this AM post wipes w/ CHG, no redness or irritation assessed   DISCHARGE MEDICATIONS:   Allergies as of 08/18/2018      Reactions   Chlorhexidine Itching   Pt reports itching this AM post wipes w/ CHG, no redness or irritation assessed      Medication List    STOP taking these medications   estradiol 0.5 MG tablet Commonly known as:  ESTRACE   gabapentin 300 MG capsule Commonly known as:  NEURONTIN   potassium gluconate 595 (99 K) MG Tabs tablet   temazepam 15 MG capsule Commonly known as:  RESTORIL   valACYclovir 1000 MG tablet Commonly known as:  VALTREX     TAKE these medications   albuterol (2.5 MG/3ML) 0.083% nebulizer solution Commonly known as:  PROVENTIL Take 3 mLs (2.5 mg total) by nebulization every 6 (six) hours as needed for wheezing or shortness of breath.   albuterol 108 (90 Base) MCG/ACT inhaler Commonly known as:  VENTOLIN HFA Inhale 2 puffs into the lungs every 6 (six) hours as needed for wheezing or shortness of breath.   B-12 PO Take 1 tablet by mouth daily.   budesonide-formoterol 160-4.5 MCG/ACT inhaler Commonly known as:  SYMBICORT Inhale 2 puffs into the lungs 2 (two) times daily.   clonazePAM 1 MG tablet Commonly known as:  KLONOPIN Take 1 tablet (1 mg total) by mouth 2 (two) times daily.   cyclobenzaprine 10 MG tablet Commonly known as:  FLEXERIL Take one tablet every 8 hours as needed   DULoxetine 30 MG capsule Commonly known as:  CYMBALTA Take 1 capsule (30 mg total) by mouth daily. What changed:    medication strength  how much to  take  how to take this  when to take this  additional instructions   FISH OIL PO Take 1 capsule by mouth 2 (two) times daily.   fluticasone 50 MCG/ACT nasal spray Commonly known as:  FLONASE Place 2 sprays into both nostrils as needed.   hydrALAZINE 25 MG tablet Commonly known as:  APRESOLINE Take 1 tablet (25 mg total) by mouth 3 (three) times daily.   ipratropium 17 MCG/ACT inhaler Commonly known as:  ATROVENT HFA Inhale 2 puffs into the lungs every 6 (six) hours as needed.   Iron Supplement 325 (65 FE) MG tablet Generic drug:  ferrous sulfate Take 1 tablet by mouth daily.   isosorbide mononitrate 30 MG 24 hr tablet Commonly known as:  IMDUR Take 1 tablet (30 mg total) by mouth daily.   levothyroxine 25 MCG tablet Commonly known as:  Synthroid Take 1 tablet (25 mcg total) by mouth daily before breakfast.   lidocaine 2 % solution Commonly known as:  XYLOCAINE Use as directed 15  mLs in the mouth or throat as needed for mouth pain.   Lysine 1000 MG Tabs Take 1 tablet by mouth daily.   Magnesium Oxide 500 MG Caps Take 500 mg by mouth 2 (two) times daily.   metoprolol succinate 50 MG 24 hr tablet Commonly known as:  TOPROL-XL Take 1 tablet (50 mg total) by mouth daily. Take with or immediately following a meal.   MULTIVITAMIN PO Take 1 tablet by mouth daily.   ondansetron 8 MG disintegrating tablet Commonly known as:  ZOFRAN-ODT Take 1 tablet (8 mg total) by mouth every 8 (eight) hours as needed for nausea or vomiting.   oxyCODONE-acetaminophen 10-325 MG tablet Commonly known as:  Percocet Take 1 tablet by mouth every 8 (eight) hours as needed for pain. What changed:  when to take this   SUMAtriptan 100 MG tablet Commonly known as:  IMITREX May repeat in 2 hours if headache persists or recurs.   VITAMIN C PO Take 1 tablet by mouth daily.   Vitamin D-1000 Max St 25 MCG (1000 UT) tablet Generic drug:  Cholecalciferol Take 1 tablet by mouth 2 (two)  times daily.        DISCHARGE INSTRUCTIONS:   1.  Follow-up with PCP within 1 week 2.  Follow-up for dialysis as outpatient, chair time Tuesday Thursday Saturday at 6:15 PM  DIET:   Renal diet  ACTIVITY:   Activity as tolerated  OXYGEN:   Home Oxygen: No.  Oxygen Delivery: room air  DISCHARGE LOCATION:   home   If you experience worsening of your admission symptoms, develop shortness of breath, life threatening emergency, suicidal or homicidal thoughts you must seek medical attention immediately by calling 911 or calling your MD immediately  if symptoms less severe.  You Must read complete instructions/literature along with all the possible adverse reactions/side effects for all the Medicines you take and that have been prescribed to you. Take any new Medicines after you have completely understood and accpet all the possible adverse reactions/side effects.   Please note  You were cared for by a hospitalist during your hospital stay. If you have any questions about your discharge medications or the care you received while you were in the hospital after you are discharged, you can call the unit and asked to speak with the hospitalist on call if the hospitalist that took care of you is not available. Once you are discharged, your primary care physician will handle any further medical issues. Please note that NO REFILLS for any discharge medications will be authorized once you are discharged, as it is imperative that you return to your primary care physician (or establish a relationship with a primary care physician if you do not have one) for your aftercare needs so that they can reassess your need for medications and monitor your lab values.    On the day of Discharge:  VITAL SIGNS:   Blood pressure (!) 153/88, pulse 80, temperature 98.6 F (37 C), temperature source Oral, resp. rate 20, height 5\' 2"  (1.575 m), weight 73.2 kg, SpO2 99 %.  PHYSICAL EXAMINATION:   GENERAL:   63 y.o.-year-old patient lying in the bed with no acute distress.  EYES: Pupils equal, round, reactive to light and accommodation. No scleral icterus. Extraocular muscles intact.  HEENT: Head atraumatic, normocephalic. Oropharynx and nasopharynx clear.  NECK:  Supple, no jugular venous distention. No thyroid enlargement, no tenderness  LUNGS: Normal breath sounds bilaterally, no wheezing, rales,rhonchi or crepitation. No use of accessory muscles  of respiration.  Decreased bibasilar breath sounds CARDIOVASCULAR: S1, S2 normal. No murmurs, rubs, or gallops. left chest  Permacath in place -Right chest Port-A-Cath ABDOMEN: Soft, nontender, nondistended. Bowel sounds present. No organomegaly or mass.  EXTREMITIES: No pedal edema, cyanosis, or clubbing.  NEUROLOGIC: Cranial nerves II through XII are intact. Muscle strength 5/5 in all extremities. Sensation intact. Gait not checked.  PSYCHIATRIC: The patient is alert and oriented x 3.  Very anxious SKIN: No obvious rash, lesion, or ulcer.    DATA REVIEW:   CBC Recent Labs  Lab 08/18/18 0023  WBC 12.6*  HGB 7.9*  HCT 24.1*  PLT 57*    Chemistries  Recent Labs  Lab 08/15/18 0926  08/18/18 0023  NA 136   < > 135  K 4.0   < > 4.6  CL 97*   < > 95*  CO2 21*   < > 29  GLUCOSE 224*   < > 153*  BUN 89*   < > 37*  CREATININE 5.65*   < > 3.28*  CALCIUM 8.2*   < > 7.9*  AST 38  --   --   ALT 19  --   --   ALKPHOS 61  --   --   BILITOT 1.4*  --   --    < > = values in this interval not displayed.     Microbiology Results  Results for orders placed or performed during the hospital encounter of 08/06/18  SARS Coronavirus 2 (CEPHEID - Performed in [place] hospital lab), Hosp Order     Status: None   Collection Time: 08/06/18  8:15 PM  Result Value Ref Range Status   SARS Coronavirus 2 NEGATIVE NEGATIVE Final    Comment: (NOTE) If result is NEGATIVE SARS-CoV-2 target nucleic acids are NOT DETECTED. The SARS-CoV-2 RNA is  generally detectable in upper and lower  respiratory specimens during the acute phase of infection. The lowest  concentration of SARS-CoV-2 viral copies this assay can detect is 250  copies / mL. A negative result does not preclude SARS-CoV-2 infection  and should not be used as the sole basis for treatment or other  patient management decisions.  A negative result may occur with  improper specimen collection / handling, submission of specimen other  than nasopharyngeal swab, presence of viral mutation(s) within the  areas targeted by this assay, and inadequate number of viral copies  (<250 copies / mL). A negative result must be combined with clinical  observations, patient history, and epidemiological information. If result is POSITIVE SARS-CoV-2 target nucleic acids are DETECTED. The SARS-CoV-2 RNA is generally detectable in upper and lower  respiratory specimens dur ing the acute phase of infection.  Positive  results are indicative of active infection with SARS-CoV-2.  Clinical  correlation with patient history and other diagnostic information is  necessary to determine patient infection status.  Positive results do  not rule out bacterial infection or co-infection with other viruses. If result is PRESUMPTIVE POSTIVE SARS-CoV-2 nucleic acids MAY BE PRESENT.   A presumptive positive result was obtained on the submitted specimen  and confirmed on repeat testing.  While 2019 novel coronavirus  (SARS-CoV-2) nucleic acids may be present in the submitted sample  additional confirmatory testing may be necessary for epidemiological  and / or clinical management purposes  to differentiate between  SARS-CoV-2 and other Sarbecovirus currently known to infect humans.  If clinically indicated additional testing with an alternate test  methodology [phone]) is advised.  The SARS-CoV-2 RNA is generally  detectable in upper and lower respiratory sp ecimens during the acute  phase of  infection. The expected result is Negative. Fact Sheet for Patients:  StrictlyIdeas.no Fact Sheet for Healthcare Providers: BankingDealers.co.za This test is not yet approved or cleared by the [place] FDA and has been authorized for detection and/or diagnosis of SARS-CoV-2 by FDA under an Emergency Use Authorization (EUA).  This EUA will remain in effect (meaning this test can be used) for the duration of the COVID-19 declaration under Section 564(b)(1) of the Act, 21 U.S.C. section 360bbb-3(b)(1), unless the authorization is terminated or revoked sooner. Performed at [facility], [address] [place], [place] [address]   MRSA PCR Screening     Status: None   Collection Time: 08/07/18  2:38 AM  Result Value Ref Range Status   MRSA by PCR NEGATIVE NEGATIVE Final    Comment:        The GeneXpert MRSA Assay (FDA approved for NASAL specimens only), is one component of a comprehensive MRSA colonization surveillance program. It is not intended to diagnose MRSA infection nor to guide or monitor treatment for MRSA infections. Performed at [facility], [address] [place], [place] [address]   CULTURE, BLOOD (ROUTINE X 2) w Reflex to ID Panel     Status: None   Collection Time: 08/07/18  3:40 AM  Result Value Ref Range Status   Specimen Description BLOOD LEFT ANTECUBITAL  Final   Special Requests   Final    BOTTLES DRAWN AEROBIC AND ANAEROBIC Blood Culture results may not be optimal due to an excessive volume of blood received in culture bottles   Culture   Final    NO GROWTH 5 DAYS Performed at [facility], [address] [place], [place] [address]    Report Status 08/12/2018 FINAL  Final  CULTURE, BLOOD (ROUTINE X 2) w Reflex to ID Panel     Status: None   Collection Time: 08/07/18  3:40 AM  Result Value Ref Range Status   Specimen Description BLOOD BLOOD LEFT HAND  Final   Special  Requests   Final    BOTTLES DRAWN AEROBIC AND ANAEROBIC Blood Culture results may not be optimal due to an excessive volume of blood received in culture bottles   Culture   Final    NO GROWTH 5 DAYS Performed at [facility], [address] [place], [place] [address]    Report Status 08/12/2018 FINAL  Final  Urine Culture     Status: None   Collection Time: 08/08/18  2:10 AM  Result Value Ref Range Status   Specimen Description   Final    URINE, RANDOM Performed at [facility], [address] [place], [place] [address]    Special Requests   Final    Normal Performed at [facility], [address], [place], [place] [address]    Culture   Final    NO GROWTH Performed at [facility], [place] [address], [place], [place] [address]    Report Status 08/09/2018 FINAL  Final    RADIOLOGY:  No results found.   Management plans discussed with the patient, family and they are in agreement.  CODE STATUS:     Code Status Orders  (From admission, onward)         Start     Ordered   08/06/18 2343  Full code  Continuous     08/06/18 2355  Code Status History    This patient has a current code status but no historical code status.    Advance Directive Documentation     Most Recent Value  Type of Advance Directive  Healthcare Power of Attorney  Pre-existing out of facility DNR order (yellow form or pink MOST form)  --  "MOST" Form in Place?  --      TOTAL TIME TAKING CARE OF THIS PATIENT: 38 minutes.    Gladstone Lighter M.D on 08/18/2018 at 10:36 AM  Between 7am to 6pm - Pager - [phone]  After 6pm go to [email or web address] - Technical brewer [facility]  Office  [phone]  CC: Primary care physician; Steele Sizer, MD   Note: This dictation was prepared with Dragon dictation along with smaller phrase technology. Any transcriptional errors that result from this process are  unintentional.

## 2018-08-18 NOTE — Telephone Encounter (Signed)
Attempted to reach patient for TCM call and confirm hospital follow up appointment. Unable to leave message due to mailbox full.

## 2018-08-19 ENCOUNTER — Telehealth: Payer: Self-pay | Admitting: Family Medicine

## 2018-08-19 DIAGNOSIS — R0902 Hypoxemia: Secondary | ICD-10-CM

## 2018-08-19 NOTE — Telephone Encounter (Signed)
Pt oxygen levels have been mostly below 88. Her levels have been fluctuating between 78 and 88. Pt family has spoken to [facility] in [place] and they will delivery oxygen with an Rx from Dr. Ancil Boozer [facility] # [phone]  Family is concerned and would like this to be done today

## 2018-08-19 NOTE — Telephone Encounter (Signed)
Rose[place] patient sister is calling stating patient needs oxygen. Patient was just released from hospital 6/2.  Rosemary checking status. Call back # [phone]

## 2018-08-19 NOTE — Telephone Encounter (Unsigned)
Copied from [place] [phone]. Topic: General - Other >> Aug 19, 2018 10:50 AM Celene Kras A wrote: Reason for CRM: Astrid Drafts, on DPR, called stating pt is still experiencing trouble breathing and she is requesting a way to receive oxygen at home. Pt also took clonazePAM (KLONOPIN) 1 MG tablet and states she had trouble sleeping last night and is requesting to take one temazepam (RESTORIL) 15 MG capsule instead. Please advise.  [id number] - [place], [place] - [place] AT [place] [address] [place] [address] Phone: [phone] Fax: [phone] Not a 24 hour pharmacy; exact hours not known.

## 2018-08-20 DIAGNOSIS — T82898A Other specified complication of vascular prosthetic devices, implants and grafts, initial encounter: Secondary | ICD-10-CM | POA: Diagnosis not present

## 2018-08-20 DIAGNOSIS — D509 Iron deficiency anemia, unspecified: Secondary | ICD-10-CM | POA: Diagnosis not present

## 2018-08-20 DIAGNOSIS — N2581 Secondary hyperparathyroidism of renal origin: Secondary | ICD-10-CM | POA: Diagnosis not present

## 2018-08-20 DIAGNOSIS — D631 Anemia in chronic kidney disease: Secondary | ICD-10-CM | POA: Diagnosis not present

## 2018-08-20 DIAGNOSIS — N179 Acute kidney failure, unspecified: Secondary | ICD-10-CM | POA: Diagnosis not present

## 2018-08-20 DIAGNOSIS — T82848A Pain from vascular prosthetic devices, implants and grafts, initial encounter: Secondary | ICD-10-CM | POA: Diagnosis not present

## 2018-08-20 DIAGNOSIS — N184 Chronic kidney disease, stage 4 (severe): Secondary | ICD-10-CM | POA: Diagnosis not present

## 2018-08-20 DIAGNOSIS — D649 Anemia, unspecified: Secondary | ICD-10-CM | POA: Diagnosis not present

## 2018-08-20 DIAGNOSIS — Z992 Dependence on renal dialysis: Secondary | ICD-10-CM | POA: Diagnosis not present

## 2018-08-20 DIAGNOSIS — E8779 Other fluid overload: Secondary | ICD-10-CM | POA: Diagnosis not present

## 2018-08-20 DIAGNOSIS — N189 Chronic kidney disease, unspecified: Secondary | ICD-10-CM | POA: Diagnosis not present

## 2018-08-20 DIAGNOSIS — N186 End stage renal disease: Secondary | ICD-10-CM | POA: Diagnosis not present

## 2018-08-20 NOTE — Telephone Encounter (Signed)
Spoke with Dr. Ancil Boozer and we sent an ambulance to patient's house to do a welfare check. Dispatcher will have the police to call with follow up.

## 2018-08-20 NOTE — Telephone Encounter (Signed)
I called patient yesterday and I talked to her Daughter and informed her that Dr. Ancil Boozer wants her to take her Mom back to the hospital. Called patient today to check on her, she said she refuses to go back to hospital. O2 level still between 88-90 and continues to have SOB. I spoke with Dr. Ancil Boozer and she wants patient to come in for a hospital follow up and evaluation for oxygen. I have tried to contact patient twice and she did not answer.

## 2018-08-20 NOTE — Telephone Encounter (Signed)
She has to call her insurance company to see who covers the study and the supplies b/c sometimes they are different then we can proceed afterwards.

## 2018-08-20 NOTE — Progress Notes (Addendum)
Name: Jessica Burgess   MRN: [id number]    DOB: 12/29/1955   Date:08/21/2018       Progress Note  Subjective  Chief Complaint  Chief Complaint  Patient presents with  . Shortness of Breath    She can't breath when she lays down at night or with exertion. When she exerts herself her O2 is 88 percent. When she sits it is at 62 with oxygen.    HPI   Hospital discharge follow up: Jessica Burgess was admitted to [facility] on 08/06/2018 for evaluation of SOB. Upon arrival she was found to be hypoxic secondary to CHF and volume overload. She also developed acute renal failure from cardiorenal syndrome and had to be started on HD, She was also given steroids IV and sent home on oral steroids for acute on chronic COPD exacerbation,however [place] does not have a history of COPD, therefore likely acute bronchitis ( although patient was a smoker never treated for recurrent respiratory infections and able to care for her own yard previously) . Reviewed labs and CXR , CT chest results. Patient was discharged home on 08/18/2018 and did not qualify for home oxygen, she has a pulse ox and has been very anxious and having SOB with pulse ox in the 88% range, we asked for her to go to [facility] two days ago but she refused, she called again yesterday asking for home oxygen, we explained that she needs to be seen, however she states after HD her pulse ox went up to 90% . We expedited her hospital discharge follow up to evaluate patient for home oxygen and to make sure she is stable. She has been afebrile. COVI-19 negative, but I will check antibodies , she had typical CT chest findings and multiorgan damage in a patient without previous history of severe renal failure or CHF. No recent changes on chemotherapy drugs or any other medications that could have triggered the acute change She also had worsening of anxiety symptoms during hospital stay and psychiatrist was consulted, she is now off Temazepam for sleep and taking klonopin bid.  Other  events during hospital stay: blood transfusion, port for HD, uncontrolled HTN  Patient Active Problem List   Diagnosis Date Noted  . Acute respiratory failure ([place])   . Anxiety 08/14/2018  . ESRD on dialysis ([place])   . Acute CHF (congestive heart failure) ([place]) 08/07/2018  . Acute CHF ([place]) 08/06/2018  . 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 Mucositis due to chemotherapy 09/02/2017  . Goals of care, counseling/discussion 08/08/2017  . Hypothyroidism due to medication 07/28/2017  . Genetic testing 12/18/2016  . Migraines 12/01/2016  . Postoperative seroma of subcutaneous tissue after non-dermatologic procedure 12/01/2016  . Transaminitis 12/01/2016  . Anemia associated with acute blood loss 11/20/2016  . Ovarian cancer, unspecified laterality ([place]) 11/12/2016  . 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 Examination of participant in clinical trial 11/01/2016  . Chronic right-sided low back pain with right-sided sciatica 02/02/2016  . Status post insertion of dialysis catheter ([place]) 08/01/2015  . Hematoma 06/14/2015  . Perennial allergic rhinitis 05/05/2015  . Generalized anxiety disorder 02/03/2015  . Back pain, chronic 08/25/2014  . Insomnia, persistent 08/25/2014  . Chronic cervical pain 08/25/2014  . Major depression, chronic ([place]) 08/25/2014  . Dyslipidemia 08/25/2014  . Gastro-esophageal reflux disease without esophagitis 08/25/2014  . H/O high risk medication treatment 08/25/2014  . Blood glucose elevated 08/25/2014  . Migraine without aura and without status migrainosus, not intractable  08/25/2014  . Climacteric 08/25/2014  . Dysmetabolic syndrome 76/73/4193  . Fungal infection of toenail 08/25/2014  . Obesity (BMI 30.0-34.9) 08/25/2014  . Vitamin D deficiency 08/25/2014  . Engages in travel abroad 08/25/2014  . Cervical disc disorder with radiculopathy 04/28/2013    Past Surgical History:  Procedure Laterality Date  . ABDOMINAL  HYSTERECTOMY    . ANTERIOR CERVICAL DECOMP/DISCECTOMY FUSION N/A 06/07/2015   Procedure: Cervical three - four and Cervical six- seven anterior cervical decompression with fusion interbody prosthesis plating and bonegraft;  Surgeon: Newman Pies, MD;  Location: [place] [facility];  Service: Neurosurgery;  Laterality: N/A;  C34 and C67 anterior cervical decompression with fusion interbody prosthesis plating and bonegraft  . BACK SURGERY     x2 Lower   . DIALYSIS/PERMA CATHETER INSERTION N/A 08/17/2018   Procedure: DIALYSIS/PERMA CATHETER INSERTION;  Surgeon: Algernon Huxley, MD;  Location: [place] CV LAB;  Service: Cardiovascular;  Laterality: N/A;  . EVACUATION OF CERVICAL HEMATOMA N/A 06/14/2015   Procedure: EVACUATION OF CERVICAL HEMATOMA;  Surgeon: Newman Pies, MD;  Location: [place] [facility];  Service: Neurosurgery;  Laterality: N/A;  . NECK SURGERY     x3  . TONSILLECTOMY      Family History  Problem Relation Age of Onset  . Depression Mother   . Migraines Mother   . Dementia Father   . Diabetes Father   . Hyperlipidemia Father   . Hyperlipidemia Brother   . Hyperlipidemia Brother   . Breast cancer Paternal Aunt        24s    Social History   Socioeconomic History  . Marital status: Single    Spouse name: Not on file  . Number of children: 0  . Years of education: some college  . Highest education level: 12th grade  Occupational History    Employer: DISABLED  . Occupation: Disabled   Social Needs  . Financial resource strain: Very hard  . Food insecurity:    Worry: Never true    Inability: Never true  . Transportation needs:    Medical: No    Non-medical: No  Tobacco Use  . Smoking status: Former Smoker    Packs/day: 1.50    Years: 20.00    Pack years: 30.00    Types: Cigarettes    Start date: 03/19/1979    Last attempt to quit: 08/25/1999    Years since quitting: 19.0  . Smokeless tobacco: Never Used  . Tobacco comment: smoking cessation materials not required   Substance and Sexual Activity  . Alcohol use: Not Currently    Alcohol/week: 0.0 standard drinks  . Drug use: No  . Sexual activity: Never  Lifestyle  . Physical activity:    Days per week: 0 days    Minutes per session: 0 min  . Stress: Very much  Relationships  . Social connections:    Talks on phone: Patient refused    Gets together: Patient refused    Attends religious service: Patient refused    Active member of club or organization: Patient refused    Attends meetings of clubs or organizations: Patient refused    Relationship status: Patient refused  . Intimate partner violence:    Fear of current or ex partner: No    Emotionally abused: No    Physically abused: No    Forced sexual activity: No  Other Topics Concern  . Not on file  Social History Narrative   Patient is single.    Patient lives with  roommates.    Patient on disability    Patient has no children.    Patient has some college      Current Outpatient Medications:  .  albuterol (PROVENTIL HFA;VENTOLIN HFA) 108 (90 Base) MCG/ACT inhaler, Inhale 2 puffs into the lungs every 6 (six) hours as needed for wheezing or shortness of breath., Disp: 1 Inhaler, Rfl: 2 .  albuterol (PROVENTIL) (2.5 MG/3ML) 0.083% nebulizer solution, Take 3 mLs (2.5 mg total) by nebulization every 6 (six) hours as needed for wheezing or shortness of breath., Disp: 75 mL, Rfl: 2 .  Ascorbic Acid (VITAMIN C PO), Take 1 tablet by mouth daily. , Disp: , Rfl:  .  budesonide-formoterol (SYMBICORT) 160-4.5 MCG/ACT inhaler, Inhale 2 puffs into the lungs 2 (two) times daily., Disp: , Rfl:  .  Cholecalciferol (VITAMIN D-1000 MAX ST) 1000 UNITS tablet, Take 1 tablet by mouth 2 (two) times daily., Disp: , Rfl:  .  clonazePAM (KLONOPIN) 1 MG tablet, Take 1 tablet (1 mg total) by mouth 2 (two) times daily., Disp: 30 tablet, Rfl: 0 .  Cyanocobalamin (B-12 PO), Take 1 tablet by mouth daily. , Disp: , Rfl:  .  cyclobenzaprine (FLEXERIL) 10 MG tablet,  Take one tablet every 8 hours as needed, Disp: 30 tablet, Rfl: 3 .  DULoxetine (CYMBALTA) 30 MG capsule, Take 1 capsule (30 mg total) by mouth daily., Disp: 30 capsule, Rfl: 1 .  ferrous sulfate (IRON SUPPLEMENT) 325 (65 FE) MG tablet, Take 1 tablet by mouth daily., Disp: , Rfl:  .  fluticasone (FLONASE) 50 MCG/ACT nasal spray, Place 2 sprays into both nostrils as needed., Disp: 48 g, Rfl: 1 .  hydrALAZINE (APRESOLINE) 25 MG tablet, Take 1 tablet (25 mg total) by mouth 3 (three) times daily., Disp: 90 tablet, Rfl: 1 .  ipratropium (ATROVENT HFA) 17 MCG/ACT inhaler, Inhale 2 puffs into the lungs every 6 (six) hours as needed. , Disp: , Rfl:  .  isosorbide mononitrate (IMDUR) 30 MG 24 hr tablet, Take 1 tablet (30 mg total) by mouth daily., Disp: 30 tablet, Rfl: 2 .  levothyroxine (SYNTHROID) 25 MCG tablet, Take 1 tablet (25 mcg total) by mouth daily before breakfast., Disp: 30 tablet, Rfl: 2 .  lidocaine (XYLOCAINE) 2 % solution, Use as directed 15 mLs in the mouth or throat as needed for mouth pain., Disp: 100 mL, Rfl: 0 .  Lysine 1000 MG TABS, Take 1 tablet by mouth daily. , Disp: , Rfl:  .  Magnesium Oxide 500 MG CAPS, Take 500 mg by mouth 2 (two) times daily. , Disp: , Rfl:  .  metoprolol succinate (TOPROL-XL) 50 MG 24 hr tablet, Take 1 tablet (50 mg total) by mouth daily. Take with or immediately following a meal., Disp: 30 tablet, Rfl: 2 .  Multiple Vitamins-Minerals (MULTIVITAMIN PO), Take 1 tablet by mouth daily. , Disp: , Rfl:  .  Omega-3 Fatty Acids (FISH OIL PO), Take 1 capsule by mouth 2 (two) times daily. , Disp: , Rfl:  .  ondansetron (ZOFRAN-ODT) 8 MG disintegrating tablet, Take 1 tablet (8 mg total) by mouth every 8 (eight) hours as needed for nausea or vomiting., Disp: 60 tablet, Rfl: 3 .  oxyCODONE-acetaminophen (PERCOCET) 10-325 MG tablet, Take 1 tablet by mouth every 8 (eight) hours as needed for pain., Disp: 20 tablet, Rfl: 0 .  predniSONE (DELTASONE) 20 MG tablet, Take 2 tablets  (40 mg total) by mouth daily for 5 days., Disp: 10 tablet, Rfl: 0 .  SUMAtriptan (IMITREX)  100 MG tablet, May repeat in 2 hours if headache persists or recurs., Disp: 9 tablet, Rfl: 0 No current facility-administered medications for this visit.   Facility-Administered Medications Ordered in Other Visits:  .  0.9 %  sodium chloride infusion, , Intravenous, Once, Finnegan, Kathlene November, MD .  0.9 %  sodium chloride infusion, , Intravenous, Once, Grayland Ormond, Kathlene November, MD .  dexamethasone (DECADRON) 20 mg in sodium chloride 0.9 % 50 mL IVPB, 20 mg, Intravenous, Once, Grayland Ormond, Kathlene November, MD .  dexamethasone (DECADRON) injection 10 mg, 10 mg, Intravenous, Once, Finnegan, Kathlene November, MD .  sodium chloride flush (NS) 0.9 % injection 10 mL, 10 mL, Intravenous, PRN, Lloyd Huger, MD, 10 mL at 06/16/17 0854  Allergies  Allergen Reactions  . Chlorhexidine Itching    Pt reports itching this AM post wipes w/ CHG, no redness or irritation assessed    I personally reviewed active problem list, medication list, allergies, family history, social history with the patient/caregiver today.   ROS  Constitutional: Negative for fever or weight change.  Respiratory: positive  for mild cough and significant  shortness of breath.   Cardiovascular: Negative for chest pain or palpitations.  Gastrointestinal: Negative for abdominal pain, no bowel changes.  Musculoskeletal: Negative for gait problem or joint swelling.  Skin: Negative for rash.  Neurological: Negative for dizziness or headache.  No other specific complaints in a complete review of systems (except as listed in HPI above).  Objective  Vitals:   08/21/18 0831  BP: (!) 150/90  Pulse: 85  Resp: 16  Temp: 98.6 F (37 C)  TempSrc: Oral  SpO2: (!) 88%  Weight: 160 lb 1.6 oz (72.6 kg)  Height: 5\' 2"  (1.575 m)    Body mass index is 29.28 kg/m.  Physical Exam  Constitutional: Patient appears well-developed and well-nourished. Overweight   She was having labored breathing when without oxygen, worse with ambulation  HEENT: head atraumatic, normocephalic, pupils equal and reactive to light,neck supple Cardiovascular: Normal rate, regular rhythm and normal heart sounds.  2/6 systolic ejection murmur heard. No BLE edema. Pulmonary/Chest: Effort normal and breath sounds normal. No respiratory distress. Abdominal: Soft.  There is no tenderness. Psychiatric: Patient has a normal mood and affect. behavior is normal. Judgment and thought content normal.   PHQ2/9: Depression screen [facility] 2/9 08/21/2018 06/10/2018 03/12/2018 12/03/2017 10/28/2017  Decreased Interest 2 0 2 0 0  Down, Depressed, Hopeless 1 0 1 0 0  PHQ - 2 Score 3 0 3 0 0  Altered sleeping 3 0 0 0 0  Tired, decreased energy 1 0 3 3 0  Change in appetite 1 0 0 2 1  Feeling bad or failure about yourself  2 0 0 0 0  Trouble concentrating 1 0 0 0 0  Moving slowly or fidgety/restless 0 0 0 0 0  Suicidal thoughts 0 0 0 0 0  PHQ-9 Score 11 0 6 5 1   Difficult doing work/chores - - Somewhat difficult Not difficult at all Not difficult at all  Some recent data might be hidden    phq 9 is positive   Fall Risk: Fall Risk  08/21/2018 03/12/2018 12/03/2017 10/28/2017 07/28/2017  Falls in the past year? 0 0 Yes Yes Yes  Comment - - - - She fell off of her bicke  Number falls in past yr: 0 0 2 or more 2 or more 1  Comment - - - - -  Injury with Fall? 0 0 - No No  Risk for fall due to : - - - - -  Risk for fall due to: Comment - - - - -  Follow up - - - - Education provided    Functional Status Survey: Is the patient deaf or have difficulty hearing?: No Does the patient have difficulty seeing, even when wearing glasses/contacts?: No Does the patient have difficulty concentrating, remembering, or making decisions?: No Does the patient have difficulty walking or climbing stairs?: Yes Does the patient have difficulty dressing or bathing?: No Does the patient have difficulty doing  errands alone such as visiting a doctor's office or shopping?: No    Assessment & Plan  1. CHF (congestive heart failure), NYHA class IV, chronic, systolic ([place])  - Ambulatory referral to Cardiology  2. Hospital discharge follow-up   3. Hypothyroidism due to medication  - TSH  4. SOB (shortness of breath) on exertion  - SAR CoV2 Serology (COVID 19)AB(IGG)IA  5. Hypoxemia  - SAR CoV2 Serology (COVID 19)AB(IGG)IA  Pulse ox at room air was 90 % at rest, it went down to 88% with activity and up to 98 % with oxygen 2 liters while walking ( with exertion)   6. Hemodialysis patient [facility])  Has a temporary access for now

## 2018-08-20 NOTE — Telephone Encounter (Signed)
Tried to call patient no answer.  Called sister and she got in contact with sister states she is resting and O2 has increased to 92%.  She refuses to go to ER, but will come in for appt tomorrow morning.  Scheduled and notified if anything worsens please do go to ER

## 2018-08-21 ENCOUNTER — Ambulatory Visit (INDEPENDENT_AMBULATORY_CARE_PROVIDER_SITE_OTHER): Payer: PPO | Admitting: Family Medicine

## 2018-08-21 ENCOUNTER — Other Ambulatory Visit: Payer: Self-pay

## 2018-08-21 ENCOUNTER — Ambulatory Visit: Payer: Self-pay

## 2018-08-21 ENCOUNTER — Telehealth: Payer: Self-pay | Admitting: Family Medicine

## 2018-08-21 ENCOUNTER — Encounter: Payer: Self-pay | Admitting: Family Medicine

## 2018-08-21 VITALS — BP 150/90 | HR 85 | Temp 98.6°F | Resp 16 | Ht 62.0 in | Wt 160.1 lb

## 2018-08-21 DIAGNOSIS — Z09 Encounter for follow-up examination after completed treatment for conditions other than malignant neoplasm: Secondary | ICD-10-CM

## 2018-08-21 DIAGNOSIS — I5022 Chronic systolic (congestive) heart failure: Secondary | ICD-10-CM

## 2018-08-21 DIAGNOSIS — Z992 Dependence on renal dialysis: Secondary | ICD-10-CM | POA: Diagnosis not present

## 2018-08-21 DIAGNOSIS — I509 Heart failure, unspecified: Secondary | ICD-10-CM | POA: Diagnosis not present

## 2018-08-21 DIAGNOSIS — R0602 Shortness of breath: Secondary | ICD-10-CM

## 2018-08-21 DIAGNOSIS — R0902 Hypoxemia: Secondary | ICD-10-CM | POA: Diagnosis not present

## 2018-08-21 DIAGNOSIS — E032 Hypothyroidism due to medicaments and other exogenous substances: Secondary | ICD-10-CM

## 2018-08-21 NOTE — Telephone Encounter (Signed)
Information has been faxed as requested to [phone].

## 2018-08-21 NOTE — Telephone Encounter (Signed)
Brother, Donnie, calling to report she still has not received her home O2. He is very concerned. Using [place] [facility] - [phone]. Call [facility] when the order is placed please - [phone].

## 2018-08-21 NOTE — Telephone Encounter (Signed)
Copied from [place] [id number]. Topic: General - Other >> Aug 21, 2018  2:12 PM Pauline Good wrote: Reason for CRM: Lynn Ito Patient is needing the pt's demographic and ins information faxed to [phone] for her oxygen order

## 2018-08-21 NOTE — Telephone Encounter (Unsigned)
Copied from [employer] [id number]. Topic: General - Other >> Aug 21, 2018  2:12 PM Pauline Good wrote: Reason for CRM: Lynn Ito Patient is needing the pt's demographic and ins information faxed to [phone] for her oxygen order

## 2018-08-21 NOTE — Telephone Encounter (Signed)
Pt seen in office 08/21/18.

## 2018-08-21 NOTE — Chronic Care Management (AMB) (Signed)
   Chronic Care Management   Unsuccessful Call Note 08/21/2018 Name: Jessica Burgess MRN: [id number] DOB: 09/19/1955  Jessica Burgess is a 63 year old female who sees Dr. Steele Sizer for primary care. Jessica Burgess was referred to [facility] Team 11/2017 by her health plan. She was engaged by [facility] Pharmacist but lost to follow up. Today RN CM received an outreach from [facility] regarding a red emmi call as patient stated she did not have all her medications and was unable to afford them.  Was unable to reach patient via telephone. I have left HIPAA compliant voicemail asking patient to return my call. (unsuccessful outreach #1).  Of note, patient presented to PCP this morning for follow up for CHF. Per note, patient being tested for COVID-19 and referral placed to cardiology.   Plan: Will follow-up within 7 business days via telephone.      Keeshawn Fakhouri E. Rollene Rotunda, RN, BSN Nurse Care Coordinator [facility] Care Management  [phone]

## 2018-08-22 LAB — TSH: TSH: 5.94 mIU/L — ABNORMAL HIGH (ref 0.40–4.50)

## 2018-08-22 LAB — SAR COV2 SEROLOGY (COVID19)AB(IGG),IA: SARS CoV2 AB IGG: NEGATIVE

## 2018-08-25 ENCOUNTER — Telehealth: Payer: PPO | Admitting: Family

## 2018-08-25 ENCOUNTER — Ambulatory Visit: Payer: Self-pay | Admitting: Pharmacist

## 2018-08-25 DIAGNOSIS — I5022 Chronic systolic (congestive) heart failure: Secondary | ICD-10-CM

## 2018-08-25 NOTE — Chronic Care Management (AMB) (Signed)
  Chronic Care Management   Note  08/25/2018 Name: [name] MRN: [id number] DOB: 06/23/1955  Jessica Burgess is a 63 y.o. year old female who sees Steele Sizer, MD for primary care. Patient was referred to embedded CCM team by an EMMI red flag. Telephone outreach to patient today to introduce CCM services.   Plan: Ms. Vanalstine agreed that CCM services would be helpful to them and verbal consent was obtained.However, she feels overwhelmed with her new diagnosis and her numerous new providers and wants to wait a month before meeting with Trish Fountain, [facility] and CCM pharmacist.    [name] was given information about Chronic Care Management services today including:  1. CCM service includes personalized support from designated clinical staff supervised by her physician, including individualized plan of care and coordination with other care providers 2. 24/7 contact phone numbers for assistance for urgent and routine care needs. 3. Service will only be billed when office clinical staff spend 20 minutes or more in a month to coordinate care. 4. Only one practitioner may furnish and bill the service in a calendar month. 5. The patient may stop CCM services at any time (effective at the end of the month) by phone call to the office staff. 6. The patient will be responsible for cost sharing (co-pay) of up to 20% of the service fee (after annual deductible is met).  Patient agreed to services and verbal consent obtained.      Follow up plan: The care management team will reach out to the patient again over the next 30 days to schedule an initial appointment, as requested by the patient.   Ruben Reason, PharmD Clinical Pharmacist [facility] [phone]

## 2018-08-25 NOTE — Telephone Encounter (Signed)
Called patient she did get her oxygen and she feels better since she got it.

## 2018-08-25 NOTE — Patient Instructions (Signed)
Jessica Burgess was given information about Chronic Care Management services today including:  1. CCM service includes personalized support from designated clinical staff supervised by her physician, including individualized plan of care and coordination with other care providers 2. 24/7 contact phone numbers for assistance for urgent and routine care needs. 3. Service will only be billed when office clinical staff spend 20 minutes or more in a month to coordinate care. 4. Only one practitioner may furnish and bill the service in a calendar month. 5. The patient may stop CCM services at any time (effective at the end of the month) by phone call to the office staff. 6. The patient will be responsible for cost sharing (co-pay) of up to 20% of the service fee (after annual deductible is met).  Patient agreed to services and verbal consent obtained.     Please call a member of the CCM (Chronic Care Management) Team with any questions or case management needs:   Vanetta Mulders, BSN Nurse Care Coordinator  [phone]  Ruben Reason, PharmD  Clinical Pharmacist  [phone]  Elliot Gurney, [place] Clinical Social Worker [phone]

## 2018-08-26 ENCOUNTER — Encounter: Payer: Self-pay | Admitting: Family Medicine

## 2018-08-26 ENCOUNTER — Telehealth: Payer: Self-pay

## 2018-08-26 ENCOUNTER — Telehealth: Payer: Self-pay | Admitting: *Deleted

## 2018-08-26 NOTE — Telephone Encounter (Signed)
TELEPHONE CALL NOTE  Jessica Burgess has been deemed a candidate for a follow-up tele-health visit to limit community exposure during the Covid-19 pandemic. I spoke with the patient via phone to ensure availability of phone/video source, confirm preferred email & phone number, discuss instructions and expectations, and review consent.   I reminded Jessica Burgess to be prepared with any vital sign and/or heart rhythm information that could potentially be obtained via home monitoring, at the time of her visit.  Finally, I reminded Jessica Burgess to expect an e-mail containing a link for their video-based visit approximately 15 minutes before her visit, or alternatively, a phone call at the time of her visit if her visit is planned to be a phone encounter.  Did the patient verbally consent to treatment as below? YES   Gaylord Shih, CMA 08/26/2018 3:26 PM  CONSENT FOR TELE-HEALTH VISIT - PLEASE REVIEW  I hereby voluntarily request, consent and authorize [facility] and its employed or contracted physicians, physician assistants, nurse practitioners or other licensed health care professionals (the Practitioner), to provide me with telemedicine health care services (the "Services") as deemed necessary by the treating Practitioner. I acknowledge and consent to receive the Services by the Practitioner via telemedicine. I understand that the telemedicine visit will involve communicating with the Practitioner through telephonic communication technology and the disclosure of certain medical information by electronic transmission. I acknowledge that I have been given the opportunity to request an in-person assessment or other available alternative prior to the telemedicine visit and am voluntarily participating in the telemedicine visit.  I understand that I have the right to withhold or withdraw my consent to the use of telemedicine in the course of my care at any time, without affecting  my right to future care or treatment, and that the Practitioner or I may terminate the telemedicine visit at any time. I understand that I have the right to inspect all information obtained and/or recorded in the course of the telemedicine visit and may receive copies of available information for a reasonable fee.  I understand that some of the potential risks of receiving the Services via telemedicine include:  Marland Kitchen Delay or interruption in medical evaluation due to technological equipment failure or disruption; . Information transmitted may not be sufficient (e.g. poor resolution of images) to allow for appropriate medical decision making by the Practitioner; and/or  . In rare instances, security protocols could fail, causing a breach of personal health information.  Furthermore, I acknowledge that it is my responsibility to provide information about my medical history, conditions and care that is complete and accurate to the best of my ability. I acknowledge that Practitioner's advice, recommendations, and/or decision may be based on factors not within their control, such as incomplete or inaccurate data provided by me or lack of visual representation. I understand that the practice of medicine is not an exact science and that Practitioner makes no warranties or guarantees regarding treatment outcomes. I acknowledge that I will receive a copy of this consent concurrently upon execution via email to the email address I last provided but may also request a printed copy by calling the office of [facility].    I understand that my insurance may be billed for this visit.   I have read or had this consent read to me. . I understand the contents of this consent, which adequately explains the benefits and risks of the Services being provided via telemedicine.  Marland Kitchen  I have been provided ample opportunity to ask questions regarding this consent and the Services and have had my questions answered to my  satisfaction. . I give my informed consent for the services to be provided through the use of telemedicine in my medical care  By participating in this telemedicine visit I agree to the above.

## 2018-08-26 NOTE — Telephone Encounter (Signed)
   TELEPHONE CALL NOTE  This patient has been deemed a candidate for follow-up tele-health visit to limit community exposure during the Covid-19 pandemic. I spoke with the patient via phone to discuss instructions. The patient was advised to review the section on consent for treatment as well. The patient will receive a phone call 2-3 days prior to their E-Visit at which time consent will be verbally confirmed. A Virtual Office Visit appointment type has been scheduled for 08/27/2018 with Darylene Price FNP.  Gaylord Shih, CMA 08/26/2018 3:25 PM

## 2018-08-26 NOTE — Telephone Encounter (Signed)

## 2018-08-27 ENCOUNTER — Other Ambulatory Visit: Payer: Self-pay

## 2018-08-27 ENCOUNTER — Encounter: Payer: Self-pay | Admitting: Family

## 2018-08-27 ENCOUNTER — Ambulatory Visit: Payer: PPO | Admitting: Family

## 2018-08-27 VITALS — Wt 156.0 lb

## 2018-08-27 DIAGNOSIS — N186 End stage renal disease: Secondary | ICD-10-CM

## 2018-08-27 DIAGNOSIS — I5022 Chronic systolic (congestive) heart failure: Secondary | ICD-10-CM

## 2018-08-27 DIAGNOSIS — F419 Anxiety disorder, unspecified: Secondary | ICD-10-CM

## 2018-08-27 DIAGNOSIS — I890 Lymphedema, not elsewhere classified: Secondary | ICD-10-CM

## 2018-08-27 NOTE — Patient Instructions (Signed)
Continue weighing daily and call for an overnight weight gain of > 2 pounds or a weekly weight gain of >5 pounds. 

## 2018-08-27 NOTE — Progress Notes (Signed)
Virtual Visit via Telephone Note   Evaluation Performed:  Initial visit  This visit type was conducted due to national recommendations for restrictions regarding the COVID-19 Pandemic (e.g. social distancing).  This format is felt to be most appropriate for this patient at this time.  All issues noted in this document were discussed and addressed.  No physical exam was performed (except for noted visual exam findings with Video Visits).  Please refer to the patient's chart (MyChart message for video visits and phone note for telephone visits) for the patient's consent to telehealth for [facility]  Date:  08/27/2018   ID:  Jessica Burgess, DOB Feb 13, 1956, MRN [id number]  Patient Location:  [place] [address]   Provider location:   [facility] [place] [address], [place] [address]  PCP:  Steele Sizer, MD  Cardiologist:  Nelva Bush, MD Electrophysiologist:  None   Chief Complaint:  Shortness of breath  History of Present Illness:    Jessica Burgess is a 63 y.o. female who presents via audio/video conferencing for a telehealth visit today.  Patient verified DOB and address.  The patient does not have symptoms concerning for COVID-19 infection (fever, chills, cough, or new SHORTNESS OF BREATH).   Patient reports severe shortness of breath upon bending over or walking much. She says that she's short of breath "all the time" and that she feels like her oxygen needs to be turned up. Currently wearing oxygen at 2L around the clock. She has associated anxiety, dizziness, swelling in her legs/ abdomen, intermittent palpitations, cough and fatigue. She denies chest pain or weight gain.   Currently doing dialysis three days/ week and she couldn't find a diuretic when going through her medications. She admits that she's anxious about her health as she says that she wasn't having this severe shortness of breath until her recent  admission back in May 2020. Has not seen cardiology or pulmonology although has cardiology appointment scheduled for tomorrow.   Had to be placed on bipap during May 2020 admission. Has been receiving chemotherapy for treatment of ovarian cancer. While she was weighing herself while I was on the phone, she became extremely short of breath and almost hyperventilating. Got her breathing slowed down by telling her to breathe in through her nose and out through her mouth.   Prior CV studies:   The following studies were reviewed today:  Echo report from 08/07/2018 reviewed and showed an EF of 40-45% along with mild/moderate AR.   Past Medical History:  Diagnosis Date  . Allergic rhinitis, cause unspecified   . Anxiety state, unspecified   . Arthritis   . Asthma    only when sick   . Backache, unspecified   . Bronchitis    hx of when get sick  . Cancer ([facility])    skin cancer , basal cell   . Cancer ([place]) 11/2016   ovarian  . Cervicalgia   . Complication of anesthesia   . Dermatophytosis of nail   . Dysmetabolic syndrome X   . Encounter for long-term (current) use of other medications   . Esophageal reflux   . Insomnia, unspecified   . Leukocytosis, unspecified   . Migraine without aura, without mention of intractable migraine without mention of status migrainosus   . Other and unspecified hyperlipidemia   . Other malaise and fatigue   . Overweight(278.02)   . Personal history of chemotherapy now   ovarian  .  PONV (postoperative nausea and vomiting)   . Spinal stenosis in cervical region   . Symptomatic menopausal or female climacteric states   . Unspecified disorder of skin and subcutaneous tissue   . Unspecified vitamin D deficiency    Past Surgical History:  Procedure Laterality Date  . ABDOMINAL HYSTERECTOMY    . ANTERIOR CERVICAL DECOMP/DISCECTOMY FUSION N/A 06/07/2015   Procedure: Cervical three - four and Cervical six- seven anterior cervical decompression with fusion  interbody prosthesis plating and bonegraft;  Surgeon: Newman Pies, MD;  Location: [facility];  Service: Neurosurgery;  Laterality: N/A;  C34 and C67 anterior cervical decompression with fusion interbody prosthesis plating and bonegraft  . BACK SURGERY     x2 Lower   . DIALYSIS/PERMA CATHETER INSERTION N/A 08/17/2018   Procedure: DIALYSIS/PERMA CATHETER INSERTION;  Surgeon: Algernon Huxley, MD;  Location: [facility];  Service: Cardiovascular;  Laterality: N/A;  . EVACUATION OF CERVICAL HEMATOMA N/A 06/14/2015   Procedure: EVACUATION OF CERVICAL HEMATOMA;  Surgeon: Newman Pies, MD;  Location: [facility];  Service: Neurosurgery;  Laterality: N/A;  . NECK SURGERY     x3  . TONSILLECTOMY       Current Meds  Medication Sig  . albuterol (PROVENTIL HFA;VENTOLIN HFA) 108 (90 Base) MCG/ACT inhaler Inhale 2 puffs into the lungs every 6 (six) hours as needed for wheezing or shortness of breath.  Marland Kitchen albuterol (PROVENTIL) (2.5 MG/3ML) 0.083% nebulizer solution Take 3 mLs (2.5 mg total) by nebulization every 6 (six) hours as needed for wheezing or shortness of breath.  . Ascorbic Acid (VITAMIN C PO) Take 1 tablet by mouth daily.   . budesonide-formoterol (SYMBICORT) 160-4.5 MCG/ACT inhaler Inhale 2 puffs into the lungs 2 (two) times daily.  . Cholecalciferol (VITAMIN D-1000 MAX ST) 1000 UNITS tablet Take 1 tablet by mouth 2 (two) times daily.  . clonazePAM (KLONOPIN) 1 MG tablet Take 1 tablet (1 mg total) by mouth 2 (two) times daily. (Patient taking differently: Take 1 mg by mouth 2 (two) times daily. And takes a 3rd one if needed for anxiety)  . Cyanocobalamin (B-12 PO) Take 1 tablet by mouth daily.   . cyclobenzaprine (FLEXERIL) 10 MG tablet Take one tablet every 8 hours as needed  . DULoxetine (CYMBALTA) 30 MG capsule Take 1 capsule (30 mg total) by mouth daily.  . ferrous sulfate (IRON SUPPLEMENT) 325 (65 FE) MG tablet Take 1 tablet by mouth daily.  . hydrALAZINE (APRESOLINE) 25 MG  tablet Take 1 tablet (25 mg total) by mouth 3 (three) times daily.  . isosorbide mononitrate (IMDUR) 30 MG 24 hr tablet Take 1 tablet (30 mg total) by mouth daily.  Marland Kitchen levothyroxine (SYNTHROID) 25 MCG tablet Take 1 tablet (25 mcg total) by mouth daily before breakfast.  . Magnesium Oxide 400 (240 Mg) MG TABS Take 400 mg by mouth 2 (two) times daily.   . metoprolol succinate (TOPROL-XL) 50 MG 24 hr tablet Take 1 tablet (50 mg total) by mouth daily. Take with or immediately following a meal.  . Multiple Vitamins-Minerals (MULTIVITAMIN PO) Take 1 tablet by mouth daily.   . Omega-3 Fatty Acids (FISH OIL PO) Take 1 capsule by mouth 2 (two) times daily.   . ondansetron (ZOFRAN-ODT) 8 MG disintegrating tablet Take 1 tablet (8 mg total) by mouth every 8 (eight) hours as needed for nausea or vomiting.  Marland Kitchen oxyCODONE-acetaminophen (PERCOCET) 10-325 MG tablet Take 1 tablet by mouth every 8 (eight) hours as needed for pain.  Marland Kitchen  SUMAtriptan (IMITREX) 100 MG tablet May repeat in 2 hours if headache persists or recurs.     Allergies:   Chlorhexidine   Social History   Tobacco Use  . Smoking status: Former Smoker    Packs/day: 1.50    Years: 20.00    Pack years: 30.00    Types: Cigarettes    Start date: 03/19/1979    Quit date: 08/25/1999    Years since quitting: 19.0  . Smokeless tobacco: Never Used  . Tobacco comment: smoking cessation materials not required  Substance Use Topics  . Alcohol use: Not Currently    Alcohol/week: 0.0 standard drinks  . Drug use: No     Family Hx: The patient's family history includes Breast cancer in her paternal aunt; Dementia in her father; Depression in her mother; Diabetes in her father; Hyperlipidemia in her brother, brother, and father; Migraines in her mother.  ROS:   Please see the history of present illness.     All other systems reviewed and are negative.   Labs/Other Tests and Data Reviewed:    Recent Labs: 08/07/2018: B Natriuretic Peptide  2,727.0 08/15/2018: ALT 19 08/18/2018: BUN 37; Creatinine, Ser 3.28; Hemoglobin 7.9; Platelets 57; Potassium 4.6; Sodium 135 08/21/2018: TSH 5.94   Recent Lipid Panel Lab Results  Component Value Date/Time   CHOL 131 05/17/2016 10:52 AM   CHOL 161 07/31/2015 12:46 PM   TRIG 171 (H) 05/17/2016 10:52 AM   HDL 48 (L) 05/17/2016 10:52 AM   HDL 39 (L) 07/31/2015 12:46 PM   CHOLHDL 2.7 05/17/2016 10:52 AM   LDLCALC 49 05/17/2016 10:52 AM   LDLCALC 50 07/31/2015 12:46 PM    Wt Readings from Last 3 Encounters:  08/27/18 156 lb (70.8 kg)  08/21/18 160 lb 1.6 oz (72.6 kg)  08/17/18 161 lb 6 oz (73.2 kg)     Exam:    Vital Signs:  Wt 156 lb (70.8 kg) Comment: self-reported  BMI 28.53 kg/m    Well nourished, well developed female in no  acute distress.   ASSESSMENT & PLAN:    1. Chronic heart failure with mildly reduced ejection fraction- - NYHA class III/IV - reports swelling in her legs / abdomen today - weighing daily and says that her weight ranges from 156-166 pounds depending on if it's a dialysis day or not - discussed calling for an overnight weight gain of >2 pounds or a weekly weight gain of >5 pounds - not adding salt to her food except on baked potatoes or fries which she doesn't eat much. Reviewed the importance of closely following a 2000mg  sodium diet - wearing oxygen at 2L around the clock although she feels like it needs to be increased - hasn't seen cardiology or pulmonology although has cardiology visit scheduled for tomorrow - may benefit from [facility] and/ or pulmonology referral - BNP 08/07/2018 was 2727.0  2: Anxiety- - patient taking clonazepam twice daily although there are days she takes a 3rd one if her anxiety is bad - encouraged her to speak with her PCP regarding this as she's going to run through her prescription faster and then won't be able to get it refilled - saw psychiatry during recent admission - saw PCP Ancil Boozer) 08/21/2018  3: ESRD on dialysis-   -  receiving dialysis three days/ week - BMP 08/18/2018 reviewed and showed sodium 135, potassium 4.6, creatinine 3.28 and GFR 14  4: Lymphedema- - stage 2 - she says that she has swelling in her legs when  she first gets up and then it progresses during the day - encouraged her to put on her compression socks first thing in the morning with removal at bedtime - encouraged to elevate her legs when sitting for long periods of time - consider lymphapress compression boots if edema persists  COVID-19 Education: The signs and symptoms of COVID-19 were discussed with the patient and how to seek care for testing (follow up with PCP or arrange E-visit).  The importance of social distancing was discussed today.  Patient Risk:   After full review of this patients clinical status, I feel that they are at least moderate risk at this time.  Time:   Today, I have spent 25 minutes with the patient with telehealth technology discussing medications, diet, weight and symptoms to report.     Medication Adjustments/Labs and Tests Ordered: Current medicines are reviewed at length with the patient today.  Concerns regarding medicines are outlined above.   Tests Ordered: No orders of the defined types were placed in this encounter.  Medication Changes: No orders of the defined types were placed in this encounter.   Disposition:  Return in 3 weeks or sooner for any questions/problems before then.   Signed, Alisa Graff, FNP  08/27/2018 1:37 PM    [facility]

## 2018-08-27 NOTE — Progress Notes (Signed)
Follow-up Outpatient Visit Date: 08/28/2018  Primary Care Provider: Steele Sizer, [address] [place] [address]  Chief Complaint: Leg swelling and shortness of breath  HPI:  Jessica Burgess is a 62 y.o. year-old female with history of ovarian cancer complicated by anemia, thrombocytopenia, and acute on chronic kidney failure leading to initiations of hemodialysis, as well as acute HFpEF, hypertension, asthma, and migrains, who presents for follow-up of heart failure.  I met Jessica Burgess during her hospitalization in late May-early June.  She presented to [facility] with worsening shortness of breath, cough, and edema.  Echo showed LVEF 40-45% with concern for anterior/anteroseptal hypokinesis.  Catheterization was not pursued due to acute on chronic renal failure as well as thrombocytopenia and transfusion dependent anemia.  Today, Jessica Burgess presents with her sister.  Jessica Burgess reports that she has noticed worsening leg swelling over the last 2 to 3 days despite being compliant with her dialysis (Tuesday, Thursday, Saturday) and having poor oral intake.  Her breathing is stable since discharge, though she becomes significantly short of breath when walking from one room to the other.  She has 2 pillow orthopnea.  She also has occasional lower chest and upper abdominal soreness, which she attributes to increased work of breathing.  Her sister notes that Jessica Burgess oxygen saturations were in the 80s last week, though they have since seem to improve to the low 90s.  Jessica Burgess voids about 5 times a day but notes that the volume is quite low.  She has not had any significant bleeding.  She has experienced some increased bruising on her upper arms though she denies trauma.  --------------------------------------------------------------------------------------------------  Past Medical History:  Diagnosis Date   Allergic rhinitis, cause unspecified    Anxiety state,  unspecified    Arthritis    Asthma    only when sick    Backache, unspecified    Bronchitis    hx of when get sick   Cancer [facility])    skin cancer , basal cell    Cancer ([place]) 11/2016   ovarian   Cervicalgia    Complication of anesthesia    Dermatophytosis of nail    Dysmetabolic syndrome X    Encounter for long-term (current) use of other medications    Esophageal reflux    Insomnia, unspecified    Leukocytosis, unspecified    Migraine without aura, without mention of intractable migraine without mention of status migrainosus    Other and unspecified hyperlipidemia    Other malaise and fatigue    Overweight(278.02)    Personal history of chemotherapy now   ovarian   PONV (postoperative nausea and vomiting)    Spinal stenosis in cervical region    Symptomatic menopausal or female climacteric states    Unspecified disorder of skin and subcutaneous tissue    Unspecified vitamin D deficiency    Past Surgical History:  Procedure Laterality Date   ABDOMINAL HYSTERECTOMY     ANTERIOR CERVICAL DECOMP/DISCECTOMY FUSION N/A 06/07/2015   Procedure: Cervical three - four and Cervical six- seven anterior cervical decompression with fusion interbody prosthesis plating and bonegraft;  Surgeon: Newman Pies, MD;  Location: [place] [facility];  Service: Neurosurgery;  Laterality: N/A;  C34 and C67 anterior cervical decompression with fusion interbody prosthesis plating and bonegraft   BACK SURGERY     x2 Lower    DIALYSIS/PERMA CATHETER INSERTION N/A 08/17/2018   Procedure: DIALYSIS/PERMA CATHETER INSERTION;  Surgeon: Algernon Huxley, MD;  Location: [facility]  INVASIVE CV LAB;  Service: Cardiovascular;  Laterality: N/A;   EVACUATION OF CERVICAL HEMATOMA N/A 06/14/2015   Procedure: EVACUATION OF CERVICAL HEMATOMA;  Surgeon: Newman Pies, MD;  Location: [place] [facility];  Service: Neurosurgery;  Laterality: N/A;   NECK SURGERY     x3   TONSILLECTOMY      Past medical and  surgical history were reviewed and updated in EPIC.  Current Meds  Medication Sig   albuterol (PROVENTIL HFA;VENTOLIN HFA) 108 (90 Base) MCG/ACT inhaler Inhale 2 puffs into the lungs every 6 (six) hours as needed for wheezing or shortness of breath.   albuterol (PROVENTIL) (2.5 MG/3ML) 0.083% nebulizer solution Take 3 mLs (2.5 mg total) by nebulization every 6 (six) hours as needed for wheezing or shortness of breath.   Ascorbic Acid (VITAMIN C PO) Take 1 tablet by mouth daily.    budesonide-formoterol (SYMBICORT) 160-4.5 MCG/ACT inhaler Inhale 2 puffs into the lungs 2 (two) times daily.   Cholecalciferol (VITAMIN D-1000 MAX ST) 1000 UNITS tablet Take 1 tablet by mouth 2 (two) times daily.   clonazePAM (KLONOPIN) 1 MG tablet Take 1 tablet (1 mg total) by mouth 2 (two) times daily. (Patient taking differently: Take 1 mg by mouth 2 (two) times daily. And takes a 3rd one if needed for anxiety)   Cyanocobalamin (B-12 PO) Take 1 tablet by mouth daily.    cyclobenzaprine (FLEXERIL) 10 MG tablet Take one tablet every 8 hours as needed   DULoxetine (CYMBALTA) 30 MG capsule Take 1 capsule (30 mg total) by mouth daily.   ferrous sulfate (IRON SUPPLEMENT) 325 (65 FE) MG tablet Take 1 tablet by mouth daily.   fluticasone (FLONASE) 50 MCG/ACT nasal spray Place 2 sprays into both nostrils as needed.   hydrALAZINE (APRESOLINE) 25 MG tablet Take 1 tablet (25 mg total) by mouth 3 (three) times daily.   ipratropium (ATROVENT HFA) 17 MCG/ACT inhaler Inhale 2 puffs into the lungs every 6 (six) hours as needed.    isosorbide mononitrate (IMDUR) 30 MG 24 hr tablet Take 1 tablet (30 mg total) by mouth daily.   levothyroxine (SYNTHROID) 25 MCG tablet Take 1 tablet (25 mcg total) by mouth daily before breakfast.   lidocaine (XYLOCAINE) 2 % solution Use as directed 15 mLs in the mouth or throat as needed for mouth pain.   Lysine 1000 MG TABS Take 1 tablet by mouth daily.    Magnesium Oxide 400 (240 Mg)  MG TABS Take 400 mg by mouth 2 (two) times daily.    metoprolol succinate (TOPROL-XL) 50 MG 24 hr tablet Take 1 tablet (50 mg total) by mouth daily. Take with or immediately following a meal.   Multiple Vitamins-Minerals (MULTIVITAMIN PO) Take 1 tablet by mouth daily.    Omega-3 Fatty Acids (FISH OIL PO) Take 1 capsule by mouth 2 (two) times daily.    ondansetron (ZOFRAN-ODT) 8 MG disintegrating tablet Take 1 tablet (8 mg total) by mouth every 8 (eight) hours as needed for nausea or vomiting.   oxyCODONE-acetaminophen (PERCOCET) 10-325 MG tablet Take 1 tablet by mouth every 8 (eight) hours as needed for pain.   SUMAtriptan (IMITREX) 100 MG tablet May repeat in 2 hours if headache persists or recurs.    Allergies: Chlorhexidine  Social History   Tobacco Use   Smoking status: Former Smoker    Packs/day: 1.50    Years: 20.00    Pack years: 30.00    Types: Cigarettes    Start date: 03/19/1979    Quit  date: 08/25/1999    Years since quitting: 19.0   Smokeless tobacco: Never Used   Tobacco comment: smoking cessation materials not required  Substance Use Topics   Alcohol use: Not Currently    Alcohol/week: 0.0 standard drinks   Drug use: No    Family History  Problem Relation Age of Onset   Depression Mother    Migraines Mother    Dementia Father    Diabetes Father    Hyperlipidemia Father    Hyperlipidemia Brother    Hyperlipidemia Brother    Breast cancer Paternal Aunt        72s    Review of Systems: A 12-system review of systems was performed and was negative except as noted in the HPI.  --------------------------------------------------------------------------------------------------  Physical Exam: BP (!) 172/92 (BP Location: Left Arm, Patient Position: Sitting, Cuff Size: Normal)    Pulse 73    Ht '5\' 2"'  (1.575 m)    Wt 164 lb (74.4 kg)    BMI 30.00 kg/m   General:  Chronically ill-appeaing HEENT: Conjunctival pallor noted.  Nasal cannula also  present. Neck: Supple without lymphadenopathy, thyromegaly.  JVP approximately 8 cm with positive HJR Lungs: Increased work of breathing.  Diminished breath sounds noted at both lung bases without wheezes or crackles. Heart: Regular rate and rhythm with 2/6 holosystolic murmur at the base.  Unable to assess PMI due to body habitus. Abd: Bowel sounds present. Soft, NT/ND without hepatosplenomegaly Ext: 1-2+ pretibial edema bilaterally. Skin: Warm and dry without rash.  EKG:  NSR without abnormality.  Lab Results  Component Value Date   WBC 12.6 (H) 08/18/2018   HGB 7.9 (L) 08/18/2018   HCT 24.1 (L) 08/18/2018   MCV 106.6 (H) 08/18/2018   PLT 57 (L) 08/18/2018    Lab Results  Component Value Date   NA 135 08/18/2018   K 4.6 08/18/2018   CL 95 (L) 08/18/2018   CO2 29 08/18/2018   BUN 37 (H) 08/18/2018   CREATININE 3.28 (H) 08/18/2018   GLUCOSE 153 (H) 08/18/2018   ALT 19 08/15/2018    Lab Results  Component Value Date   Burgess 131 05/17/2016   HDL 48 (L) 05/17/2016   LDLCALC 49 05/17/2016   TRIG 171 (H) 05/17/2016   CHOLHDL 2.7 05/17/2016    --------------------------------------------------------------------------------------------------  ASSESSMENT AND PLAN: Acute on chronic HFpEF and pericardial effusion: Patient noted to have mildly to moderately reduced LVEF during recent hospitalization.  Small pericardial effusion was also noted.  However, she had significant dyspnea requiring BiPAP while hospitalized, felt to be a combination of volume overload and possible TRALI.  She has been compliant with dialysis but reports increased weight gain.  I have spoken with Dr. Juleen China, and it turns out she is actually not received much ultrafiltration recently.  She is up 4 pounds over the last 2 days.  We have agreed to start furosemide 80 mg p.o. twice daily.  Nephrology will also escalate fluid removal beginning with tomorrow's dialysis session.  I will obtain a limited echocardiogram  today to ensure that EF has not dropped further and that there is not an enlarging pericardial effusion.  We will continue current dose of metoprolol.  ACE inhibitors/ARB's and aldosterone antagonists are precluded in the setting of advanced renal failure with hope for some renal recovery.  I will also check a chest radiograph, CMP, and BNP.  Based on results, we may need to refer Jessica Burgess to the emergency department, though she wishes to avoid that  if at all possible.  At some point, Jessica Burgess would benefit from cardiac catheterization.  However, in the setting of renal failure and significant anemia/thrombocytopenia, the risks outweigh benefits.  Anemia: I am concerned that severe anemia may also be contributing to Jessica Burgess symptoms.  Hemoglobin at the time of hospital discharge on 6/2 was 7.9.  We will repeat a CBC today.  I have encouraged Jessica Burgess to contact Dr. Grayland Ormond for further follow-up of her ovarian cancer and anemia/thrombocytopenia.  Hypertension: May be partially driven by volume overload.  We will continue current medications and add furosemide 80 mg twice daily.  Further fluid removal and blood pressure management per nephrology as well.  Follow-up: Return to clinic in 1 week.  Nelva Bush, MD 08/28/2018 8:20 AM

## 2018-08-28 ENCOUNTER — Telehealth: Payer: Self-pay | Admitting: Internal Medicine

## 2018-08-28 ENCOUNTER — Other Ambulatory Visit: Payer: Self-pay

## 2018-08-28 ENCOUNTER — Encounter: Payer: Self-pay | Admitting: Internal Medicine

## 2018-08-28 ENCOUNTER — Encounter: Payer: Self-pay | Admitting: Emergency Medicine

## 2018-08-28 ENCOUNTER — Ambulatory Visit
Admission: RE | Admit: 2018-08-28 | Discharge: 2018-08-28 | Disposition: A | Discharge status: Home / Self Care | Payer: PPO | Source: Ambulatory Visit | Attending: Internal Medicine | Admitting: Internal Medicine

## 2018-08-28 ENCOUNTER — Ambulatory Visit (INDEPENDENT_AMBULATORY_CARE_PROVIDER_SITE_OTHER): Payer: PPO | Admitting: Internal Medicine

## 2018-08-28 ENCOUNTER — Inpatient Hospital Stay
Admission: EM | Admit: 2018-08-28 | Discharge: 2018-08-30 | DRG: 291 | Disposition: A | Discharge status: Home / Self Care | Payer: PPO | Attending: Internal Medicine | Admitting: Internal Medicine

## 2018-08-28 ENCOUNTER — Inpatient Hospital Stay (HOSPITAL_BASED_OUTPATIENT_CLINIC_OR_DEPARTMENT_OTHER): Payer: PPO | Admitting: Oncology

## 2018-08-28 ENCOUNTER — Ambulatory Visit (INDEPENDENT_AMBULATORY_CARE_PROVIDER_SITE_OTHER): Payer: PPO

## 2018-08-28 ENCOUNTER — Inpatient Hospital Stay: Payer: PPO | Attending: Oncology

## 2018-08-28 ENCOUNTER — Other Ambulatory Visit
Admission: RE | Admit: 2018-08-28 | Discharge: 2018-08-28 | Disposition: A | Discharge status: Home / Self Care | Payer: PPO | Source: Home / Self Care | Attending: Internal Medicine | Admitting: Internal Medicine

## 2018-08-28 ENCOUNTER — Emergency Department: Payer: PPO

## 2018-08-28 VITALS — BP 166/79 | HR 77 | Temp 97.0°F | Resp 24

## 2018-08-28 VITALS — BP 172/92 | HR 73 | Ht 62.0 in | Wt 164.0 lb

## 2018-08-28 DIAGNOSIS — I132 Hypertensive heart and chronic kidney disease with heart failure and with stage 5 chronic kidney disease, or end stage renal disease: Secondary | ICD-10-CM | POA: Diagnosis not present

## 2018-08-28 DIAGNOSIS — I313 Pericardial effusion (noninflammatory): Secondary | ICD-10-CM | POA: Insufficient documentation

## 2018-08-28 DIAGNOSIS — Z833 Family history of diabetes mellitus: Secondary | ICD-10-CM

## 2018-08-28 DIAGNOSIS — Z992 Dependence on renal dialysis: Secondary | ICD-10-CM | POA: Diagnosis not present

## 2018-08-28 DIAGNOSIS — Z79890 Hormone replacement therapy (postmenopausal): Secondary | ICD-10-CM

## 2018-08-28 DIAGNOSIS — I5033 Acute on chronic diastolic (congestive) heart failure: Secondary | ICD-10-CM

## 2018-08-28 DIAGNOSIS — N183 Chronic kidney disease, stage 3 (moderate): Secondary | ICD-10-CM | POA: Diagnosis not present

## 2018-08-28 DIAGNOSIS — F411 Generalized anxiety disorder: Secondary | ICD-10-CM | POA: Diagnosis present

## 2018-08-28 DIAGNOSIS — Z87891 Personal history of nicotine dependence: Secondary | ICD-10-CM

## 2018-08-28 DIAGNOSIS — Z8543 Personal history of malignant neoplasm of ovary: Secondary | ICD-10-CM

## 2018-08-28 DIAGNOSIS — I3139 Other pericardial effusion (noninflammatory): Secondary | ICD-10-CM

## 2018-08-28 DIAGNOSIS — Z8349 Family history of other endocrine, nutritional and metabolic diseases: Secondary | ICD-10-CM

## 2018-08-28 DIAGNOSIS — I10 Essential (primary) hypertension: Secondary | ICD-10-CM | POA: Diagnosis not present

## 2018-08-28 DIAGNOSIS — Z85828 Personal history of other malignant neoplasm of skin: Secondary | ICD-10-CM

## 2018-08-28 DIAGNOSIS — Z20828 Contact with and (suspected) exposure to other viral communicable diseases: Secondary | ICD-10-CM | POA: Diagnosis not present

## 2018-08-28 DIAGNOSIS — J45909 Unspecified asthma, uncomplicated: Secondary | ICD-10-CM | POA: Diagnosis present

## 2018-08-28 DIAGNOSIS — E785 Hyperlipidemia, unspecified: Secondary | ICD-10-CM | POA: Diagnosis not present

## 2018-08-28 DIAGNOSIS — I110 Hypertensive heart disease with heart failure: Secondary | ICD-10-CM | POA: Diagnosis not present

## 2018-08-28 DIAGNOSIS — J810 Acute pulmonary edema: Secondary | ICD-10-CM | POA: Diagnosis not present

## 2018-08-28 DIAGNOSIS — Z79899 Other long term (current) drug therapy: Secondary | ICD-10-CM

## 2018-08-28 DIAGNOSIS — D649 Anemia, unspecified: Secondary | ICD-10-CM

## 2018-08-28 DIAGNOSIS — G43009 Migraine without aura, not intractable, without status migrainosus: Secondary | ICD-10-CM | POA: Diagnosis present

## 2018-08-28 DIAGNOSIS — Z818 Family history of other mental and behavioral disorders: Secondary | ICD-10-CM | POA: Diagnosis not present

## 2018-08-28 DIAGNOSIS — Z803 Family history of malignant neoplasm of breast: Secondary | ICD-10-CM | POA: Diagnosis not present

## 2018-08-28 DIAGNOSIS — I5030 Unspecified diastolic (congestive) heart failure: Secondary | ICD-10-CM

## 2018-08-28 DIAGNOSIS — K219 Gastro-esophageal reflux disease without esophagitis: Secondary | ICD-10-CM | POA: Diagnosis present

## 2018-08-28 DIAGNOSIS — N186 End stage renal disease: Secondary | ICD-10-CM | POA: Diagnosis not present

## 2018-08-28 DIAGNOSIS — C569 Malignant neoplasm of unspecified ovary: Secondary | ICD-10-CM | POA: Diagnosis not present

## 2018-08-28 DIAGNOSIS — I517 Cardiomegaly: Secondary | ICD-10-CM | POA: Diagnosis not present

## 2018-08-28 DIAGNOSIS — Z79891 Long term (current) use of opiate analgesic: Secondary | ICD-10-CM | POA: Diagnosis not present

## 2018-08-28 DIAGNOSIS — D696 Thrombocytopenia, unspecified: Secondary | ICD-10-CM | POA: Diagnosis not present

## 2018-08-28 DIAGNOSIS — I509 Heart failure, unspecified: Secondary | ICD-10-CM | POA: Diagnosis not present

## 2018-08-28 DIAGNOSIS — M4802 Spinal stenosis, cervical region: Secondary | ICD-10-CM | POA: Diagnosis not present

## 2018-08-28 DIAGNOSIS — Z90710 Acquired absence of both cervix and uterus: Secondary | ICD-10-CM

## 2018-08-28 DIAGNOSIS — E039 Hypothyroidism, unspecified: Secondary | ICD-10-CM | POA: Diagnosis present

## 2018-08-28 DIAGNOSIS — N179 Acute kidney failure, unspecified: Secondary | ICD-10-CM | POA: Diagnosis present

## 2018-08-28 DIAGNOSIS — D509 Iron deficiency anemia, unspecified: Secondary | ICD-10-CM | POA: Diagnosis present

## 2018-08-28 DIAGNOSIS — Z1159 Encounter for screening for other viral diseases: Secondary | ICD-10-CM | POA: Diagnosis not present

## 2018-08-28 DIAGNOSIS — Z9221 Personal history of antineoplastic chemotherapy: Secondary | ICD-10-CM

## 2018-08-28 HISTORY — DX: Disorder of kidney and ureter, unspecified: N28.9

## 2018-08-28 LAB — CBC WITH DIFFERENTIAL/PLATELET
Abs Immature Granulocytes: 0.02 10*3/uL (ref 0.00–0.07)
Basophils Absolute: 0 10*3/uL (ref 0.0–0.1)
Basophils Relative: 0 %
Eosinophils Absolute: 0.3 10*3/uL (ref 0.0–0.5)
Eosinophils Relative: 4 %
HCT: 21.7 % — ABNORMAL LOW (ref 36.0–46.0)
Hemoglobin: 6.9 g/dL — ABNORMAL LOW (ref 12.0–15.0)
Immature Granulocytes: 0 %
Lymphocytes Relative: 13 %
Lymphs Abs: 0.9 10*3/uL (ref 0.7–4.0)
MCH: 35.8 pg — ABNORMAL HIGH (ref 26.0–34.0)
MCHC: 31.8 g/dL (ref 30.0–36.0)
MCV: 112.4 fL — ABNORMAL HIGH (ref 80.0–100.0)
Monocytes Absolute: 0.9 10*3/uL (ref 0.1–1.0)
Monocytes Relative: 13 %
Neutro Abs: 4.7 10*3/uL (ref 1.7–7.7)
Neutrophils Relative %: 70 %
Platelets: 72 10*3/uL — ABNORMAL LOW (ref 150–400)
RBC: 1.93 MIL/uL — ABNORMAL LOW (ref 3.87–5.11)
RDW: 19.9 % — ABNORMAL HIGH (ref 11.5–15.5)
Smear Review: NORMAL
WBC: 6.8 10*3/uL (ref 4.0–10.5)
nRBC: 0 % (ref 0.0–0.2)

## 2018-08-28 LAB — CBC
HCT: 22 % — ABNORMAL LOW (ref 36.0–46.0)
Hemoglobin: 7 g/dL — ABNORMAL LOW (ref 12.0–15.0)
MCH: 36.5 pg — ABNORMAL HIGH (ref 26.0–34.0)
MCHC: 31.8 g/dL (ref 30.0–36.0)
MCV: 114.6 fL — ABNORMAL HIGH (ref 80.0–100.0)
Platelets: 82 10*3/uL — ABNORMAL LOW (ref 150–400)
RBC: 1.92 MIL/uL — ABNORMAL LOW (ref 3.87–5.11)
RDW: 20.2 % — ABNORMAL HIGH (ref 11.5–15.5)
WBC: 9.2 10*3/uL (ref 4.0–10.5)
nRBC: 0 % (ref 0.0–0.2)

## 2018-08-28 LAB — COMPREHENSIVE METABOLIC PANEL
ALT: 17 U/L (ref 0–44)
ALT: 17 U/L (ref 0–44)
AST: 29 U/L (ref 15–41)
AST: 37 U/L (ref 15–41)
Albumin: 2.9 g/dL — ABNORMAL LOW (ref 3.5–5.0)
Albumin: 3 g/dL — ABNORMAL LOW (ref 3.5–5.0)
Alkaline Phosphatase: 60 U/L (ref 38–126)
Alkaline Phosphatase: 61 U/L (ref 38–126)
Anion gap: 9 (ref 5–15)
Anion gap: 10 (ref 5–15)
BUN: 31 mg/dL — ABNORMAL HIGH (ref 8–23)
BUN: 34 mg/dL — ABNORMAL HIGH (ref 8–23)
CO2: 29 mmol/L (ref 22–32)
CO2: 29 mmol/L (ref 22–32)
Calcium: 8.1 mg/dL — ABNORMAL LOW (ref 8.9–10.3)
Calcium: 7.9 mg/dL — ABNORMAL LOW (ref 8.9–10.3)
Chloride: 97 mmol/L — ABNORMAL LOW (ref 98–111)
Chloride: 95 mmol/L — ABNORMAL LOW (ref 98–111)
Creatinine, Ser: 4.89 mg/dL — ABNORMAL HIGH (ref 0.44–1.00)
Creatinine, Ser: 5.21 mg/dL — ABNORMAL HIGH (ref 0.44–1.00)
GFR calc Af Amer: 10 mL/min — ABNORMAL LOW (ref >60)
GFR calc Af Amer: 9 mL/min — ABNORMAL LOW (ref >60)
GFR calc non Af Amer: 9 mL/min — ABNORMAL LOW (ref >60)
GFR calc non Af Amer: 8 mL/min — ABNORMAL LOW (ref >60)
Glucose, Bld: 96 mg/dL (ref 70–99)
Glucose, Bld: 121 mg/dL — ABNORMAL HIGH (ref 70–99)
Potassium: 4.8 mmol/L (ref 3.5–5.1)
Potassium: 4.3 mmol/L (ref 3.5–5.1)
Sodium: 135 mmol/L (ref 135–145)
Sodium: 134 mmol/L — ABNORMAL LOW (ref 135–145)
Total Bilirubin: 0.9 mg/dL (ref 0.3–1.2)
Total Bilirubin: 0.8 mg/dL (ref 0.3–1.2)
Total Protein: 5.4 g/dL — ABNORMAL LOW (ref 6.5–8.1)
Total Protein: 5.5 g/dL — ABNORMAL LOW (ref 6.5–8.1)

## 2018-08-28 LAB — BRAIN NATRIURETIC PEPTIDE
B Natriuretic Peptide: >4500 pg/mL — ABNORMAL HIGH (ref 0.0–100.0)
B Natriuretic Peptide: >4500 pg/mL — ABNORMAL HIGH (ref 0.0–100.0)

## 2018-08-28 LAB — PROTIME-INR
INR: 1.1 (ref 0.8–1.2)
Prothrombin Time: 13.7 seconds (ref 11.4–15.2)

## 2018-08-28 LAB — ECHOCARDIOGRAM LIMITED
Height: 62 in
Weight: 2624 oz

## 2018-08-28 LAB — TROPONIN I: Troponin I: 0.07 ng/mL (ref <0.03)

## 2018-08-28 LAB — TYPE AND SCREEN
ABO/RH(D): B POS
Antibody Screen: NEGATIVE

## 2018-08-28 LAB — SARS CORONAVIRUS 2 BY RT PCR (HOSPITAL ORDER, PERFORMED IN ~~LOC~~ HOSPITAL LAB): SARS Coronavirus 2: NEGATIVE

## 2018-08-28 MED ORDER — IPRATROPIUM BROMIDE 0.02 % IN SOLN
2.5000 mL | Freq: Four times a day (QID) | RESPIRATORY_TRACT | Status: DC
  Administered 2018-08-29 – 2018-08-30 (×4): 0.5 mg via RESPIRATORY_TRACT
  Filled 2018-08-28 (×4): qty 2.5

## 2018-08-28 MED ORDER — LIDOCAINE VISCOUS HCL 2 % MT SOLN
15.0000 mL | OROMUCOSAL | Status: DC | PRN
  Filled 2018-08-28: qty 15

## 2018-08-28 MED ORDER — ONDANSETRON HCL 4 MG/2ML IJ SOLN: 4.0000 mg | Freq: Four times a day (QID) | INTRAMUSCULAR | Status: DC | PRN

## 2018-08-28 MED ORDER — OXYCODONE HCL 5 MG PO TABS
5.0000 mg | ORAL_TABLET | Freq: Three times a day (TID) | ORAL | Status: DC | PRN
  Administered 2018-08-29 – 2018-08-30 (×2): 5 mg via ORAL
  Filled 2018-08-28 (×2): qty 1

## 2018-08-28 MED ORDER — FERROUS SULFATE 325 (65 FE) MG PO TABS
325.0000 mg | ORAL_TABLET | Freq: Every day | ORAL | Status: DC
  Administered 2018-08-28 – 2018-08-30 (×3): 325 mg via ORAL
  Filled 2018-08-28 (×3): qty 1

## 2018-08-28 MED ORDER — ONDANSETRON 8 MG PO TBDP
8.0000 mg | ORAL_TABLET | Freq: Three times a day (TID) | ORAL | Status: DC | PRN
  Filled 2018-08-28: qty 1

## 2018-08-28 MED ORDER — ADULT MULTIVITAMIN W/MINERALS CH
1.0000 | ORAL_TABLET | Freq: Every day | ORAL | Status: DC
  Administered 2018-08-28 – 2018-08-30 (×3): 1 via ORAL
  Filled 2018-08-28 (×3): qty 1

## 2018-08-28 MED ORDER — SUMATRIPTAN SUCCINATE 50 MG PO TABS
100.0000 mg | ORAL_TABLET | ORAL | Status: DC | PRN
  Filled 2018-08-28: qty 2

## 2018-08-28 MED ORDER — ALBUTEROL SULFATE (2.5 MG/3ML) 0.083% IN NEBU: 2.5000 mg | INHALATION_SOLUTION | Freq: Four times a day (QID) | RESPIRATORY_TRACT | Status: DC | PRN

## 2018-08-28 MED ORDER — CLONAZEPAM 1 MG PO TABS
1.0000 mg | ORAL_TABLET | Freq: Two times a day (BID) | ORAL | Status: DC
  Administered 2018-08-28 – 2018-08-30 (×4): 1 mg via ORAL
  Filled 2018-08-28 (×4): qty 1

## 2018-08-28 MED ORDER — FUROSEMIDE 80 MG PO TABS: 80.0000 mg | ORAL_TABLET | Freq: Two times a day (BID) | ORAL | 3 refills | Status: DC

## 2018-08-28 MED ORDER — CYCLOBENZAPRINE HCL 10 MG PO TABS: 5.0000 mg | ORAL_TABLET | Freq: Three times a day (TID) | ORAL | Status: DC | PRN

## 2018-08-28 MED ORDER — OXYCODONE-ACETAMINOPHEN 5-325 MG PO TABS
1.0000 | ORAL_TABLET | Freq: Three times a day (TID) | ORAL | Status: DC | PRN
  Administered 2018-08-29 – 2018-08-30 (×3): 1 via ORAL
  Filled 2018-08-28 (×3): qty 1

## 2018-08-28 MED ORDER — SODIUM CHLORIDE 0.9% FLUSH
3.0000 mL | Freq: Two times a day (BID) | INTRAVENOUS | Status: DC
  Administered 2018-08-28 – 2018-08-30 (×4): 3 mL via INTRAVENOUS

## 2018-08-28 MED ORDER — METOPROLOL SUCCINATE ER 50 MG PO TB24
50.0000 mg | ORAL_TABLET | Freq: Every day | ORAL | Status: DC
  Administered 2018-08-29 – 2018-08-30 (×2): 50 mg via ORAL
  Filled 2018-08-28 (×2): qty 1

## 2018-08-28 MED ORDER — LEVOTHYROXINE SODIUM 25 MCG PO TABS
25.0000 ug | ORAL_TABLET | Freq: Every day | ORAL | Status: DC
  Administered 2018-08-29 – 2018-08-30 (×3): 25 ug via ORAL
  Filled 2018-08-28 (×3): qty 1

## 2018-08-28 MED ORDER — LYSINE 1000 MG PO TABS: 1.0000 | ORAL_TABLET | Freq: Every day | ORAL | Status: DC

## 2018-08-28 MED ORDER — FLUTICASONE PROPIONATE 50 MCG/ACT NA SUSP
2.0000 | Freq: Every day | NASAL | Status: DC | PRN
  Filled 2018-08-28: qty 16

## 2018-08-28 MED ORDER — DIPHENHYDRAMINE HCL 50 MG/ML IJ SOLN
12.5000 mg | Freq: Once | INTRAMUSCULAR | Status: AC
  Administered 2018-08-28: 16:00:00 12.5 mg via INTRAVENOUS
  Filled 2018-08-28: qty 1

## 2018-08-28 MED ORDER — SODIUM CHLORIDE 0.9% FLUSH: 3.0000 mL | INTRAVENOUS | Status: DC | PRN

## 2018-08-28 MED ORDER — ALPRAZOLAM 0.25 MG PO TABS
0.2500 mg | ORAL_TABLET | Freq: Two times a day (BID) | ORAL | Status: DC | PRN
  Filled 2018-08-28: qty 1

## 2018-08-28 MED ORDER — HYDRALAZINE HCL 50 MG PO TABS
25.0000 mg | ORAL_TABLET | Freq: Three times a day (TID) | ORAL | Status: DC
  Administered 2018-08-28 – 2018-08-30 (×4): 25 mg via ORAL
  Filled 2018-08-28 (×4): qty 1

## 2018-08-28 MED ORDER — FUROSEMIDE 10 MG/ML IJ SOLN
80.0000 mg | Freq: Three times a day (TID) | INTRAMUSCULAR | Status: DC
  Administered 2018-08-28 – 2018-08-30 (×4): 80 mg via INTRAVENOUS
  Filled 2018-08-28 (×5): qty 8

## 2018-08-28 MED ORDER — OMEGA-3-ACID ETHYL ESTERS 1 G PO CAPS
2.0000 g | ORAL_CAPSULE | Freq: Every day | ORAL | Status: DC
  Administered 2018-08-28 – 2018-08-30 (×3): 2 g via ORAL
  Filled 2018-08-28 (×3): qty 2

## 2018-08-28 MED ORDER — ACETAMINOPHEN 325 MG PO TABS
650.0000 mg | ORAL_TABLET | ORAL | Status: DC | PRN
  Administered 2018-08-29: 650 mg via ORAL

## 2018-08-28 MED ORDER — CLONAZEPAM 1 MG PO TABS: 1.0000 mg | ORAL_TABLET | Freq: Every day | ORAL | Status: DC | PRN

## 2018-08-28 MED ORDER — IOHEXOL 350 MG/ML SOLN: 75.0000 mL | Freq: Once | INTRAVENOUS | Status: DC | PRN

## 2018-08-28 MED ORDER — VITAMIN D 25 MCG (1000 UNIT) PO TABS
1000.0000 [IU] | ORAL_TABLET | Freq: Two times a day (BID) | ORAL | Status: DC
  Administered 2018-08-28 – 2018-08-30 (×4): 1000 [IU] via ORAL
  Filled 2018-08-28 (×4): qty 1

## 2018-08-28 MED ORDER — MAGNESIUM OXIDE 400 (241.3 MG) MG PO TABS
400.0000 mg | ORAL_TABLET | Freq: Two times a day (BID) | ORAL | Status: DC
  Administered 2018-08-28 – 2018-08-30 (×4): 400 mg via ORAL
  Filled 2018-08-28 (×4): qty 1

## 2018-08-28 MED ORDER — VITAMIN C 500 MG PO TABS
250.0000 mg | ORAL_TABLET | Freq: Every day | ORAL | Status: DC
  Administered 2018-08-28 – 2018-08-30 (×3): 250 mg via ORAL
  Filled 2018-08-28 (×3): qty 1

## 2018-08-28 MED ORDER — SODIUM CHLORIDE 0.9 % IV SOLN: 250.0000 mL | INTRAVENOUS | Status: DC | PRN

## 2018-08-28 MED ORDER — DULOXETINE HCL 30 MG PO CPEP
30.0000 mg | ORAL_CAPSULE | Freq: Every day | ORAL | Status: DC
  Administered 2018-08-29 – 2018-08-30 (×2): 30 mg via ORAL
  Filled 2018-08-28 (×2): qty 1

## 2018-08-28 MED ORDER — MOMETASONE FURO-FORMOTEROL FUM 200-5 MCG/ACT IN AERO
2.0000 | INHALATION_SPRAY | Freq: Two times a day (BID) | RESPIRATORY_TRACT | Status: DC
  Administered 2018-08-28 – 2018-08-30 (×4): 2 via RESPIRATORY_TRACT
  Filled 2018-08-28 (×2): qty 8.8

## 2018-08-28 MED ORDER — OXYCODONE-ACETAMINOPHEN 10-325 MG PO TABS: 1.0000 | ORAL_TABLET | Freq: Three times a day (TID) | ORAL | Status: DC | PRN

## 2018-08-28 MED ORDER — ISOSORBIDE MONONITRATE ER 30 MG PO TB24
30.0000 mg | ORAL_TABLET | Freq: Every day | ORAL | Status: DC
  Administered 2018-08-29 – 2018-08-30 (×2): 30 mg via ORAL
  Filled 2018-08-28 (×2): qty 1

## 2018-08-28 MED ORDER — HEPARIN SODIUM (PORCINE) 5000 UNIT/ML IJ SOLN
5000.0000 [IU] | Freq: Three times a day (TID) | INTRAMUSCULAR | Status: DC
  Administered 2018-08-28 – 2018-08-30 (×4): 5000 [IU] via SUBCUTANEOUS
  Filled 2018-08-28 (×4): qty 1

## 2018-08-28 MED ORDER — VITAMIN B-12 100 MCG PO TABS
100.0000 ug | ORAL_TABLET | Freq: Every day | ORAL | Status: DC
  Administered 2018-08-28 – 2018-08-30 (×3): 100 ug via ORAL
  Filled 2018-08-28 (×3): qty 1

## 2018-08-28 NOTE — ED Notes (Signed)
08/28/18 1512   Test: troponin  Critical Value: 0.07  Name of Provider Notified: McShane  No new orders at this time

## 2018-08-28 NOTE — ED Notes (Signed)
Patient given remote and sprite

## 2018-08-28 NOTE — ED Provider Notes (Signed)
[facility] Emergency Department Provider Note  ____________________________________________   I have reviewed the triage vital signs and the nursing notes. Where available I have reviewed prior notes and, if possible and indicated, outside hospital notes.    HISTORY  Chief Complaint Shortness of Breath    HPI Jessica Burgess is a 64 y.o. female a very complicated and significant history including cancer for which she is currently getting therapy, anemia, with a baseline hemoglobin around 7, end-stage renal still making urine, on dialysis, Tuesday Thursday Saturday which she has been taking, bronchitis, CHF, see list below, patient seen and evaluated during the coronavirus epidemic during a time with low staffing who is on home oxygen, presents today complaining of shortness of breath and leg swelling.  She did have dialysis yesterday.  She states she gained weight over the night last night.  She saw her cardiologist, and he recommended she come here to be evaluated for the possibility of a PE.  She does have a different other reasons to have shortness of breath as he acknowledges including anemia, CHF, reported possible pericardial effusion, pulmonary effusions noted on chest x-ray, however she does have cancer and he is wondering if perhaps there was a PE at least according to notes.  She does not have a known history of PE at least so far she reports.  He does report increasing dyspnea at home, and orthopnea. Note, patient states she has been feeling generally itchy for the last 2 weeks.  No anaphylactic symptoms.  Past Medical History:  Diagnosis Date  . Allergic rhinitis, cause unspecified   . Anxiety state, unspecified   . Arthritis   . Asthma    only when sick   . Backache, unspecified   . Bronchitis    hx of when get sick  . Cancer ([place])    skin cancer , basal cell   . Cancer ([place]) 11/2016   ovarian  . Cervicalgia   . Complication of anesthesia   .  Dermatophytosis of nail   . Dysmetabolic syndrome X   . Encounter for long-term (current) use of other medications   . Esophageal reflux   . Insomnia, unspecified   . Leukocytosis, unspecified   . Migraine without aura, without mention of intractable migraine without mention of status migrainosus   . Other and unspecified hyperlipidemia   . Other malaise and fatigue   . Overweight(278.02)   . Personal history of chemotherapy now   ovarian  . PONV (postoperative nausea and vomiting)   . Renal insufficiency   . Spinal stenosis in cervical region   . Symptomatic menopausal or female climacteric states   . Unspecified disorder of skin and subcutaneous tissue   . Unspecified vitamin D deficiency     Patient Active Problem List   Diagnosis Date Noted  . Acute respiratory failure ([place])   . Anxiety 08/14/2018  . ESRD on dialysis ([place])   . Acute CHF (congestive heart failure) ([place]) 08/07/2018  . Acute CHF ([place]) 08/06/2018  . 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 Mucositis due to chemotherapy 09/02/2017  . Goals of care, counseling/discussion 08/08/2017  . Hypothyroidism due to medication 07/28/2017  . Genetic testing 12/18/2016  . Migraines 12/01/2016  . Postoperative seroma of subcutaneous tissue after non-dermatologic procedure 12/01/2016  . Transaminitis 12/01/2016  . Anemia associated with acute blood loss 11/20/2016  . Ovarian cancer, unspecified laterality ([place]) 11/12/2016  . 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 Examination  of participant in clinical trial 11/01/2016  . Chronic right-sided low back pain with right-sided sciatica 02/02/2016  . Status post insertion of dialysis catheter ([place]) 08/01/2015  . Hematoma 06/14/2015  . Perennial allergic rhinitis 05/05/2015  . Generalized anxiety disorder 02/03/2015  . Back pain, chronic 08/25/2014  . Insomnia, persistent 08/25/2014  . Chronic cervical pain 08/25/2014  . Major depression, chronic  ([place]) 08/25/2014  . Dyslipidemia 08/25/2014  . Gastro-esophageal reflux disease without esophagitis 08/25/2014  . H/O high risk medication treatment 08/25/2014  . Blood glucose elevated 08/25/2014  . Migraine without aura and without status migrainosus, not intractable 08/25/2014  . Climacteric 08/25/2014  . Dysmetabolic syndrome 51/04/5850  . Fungal infection of toenail 08/25/2014  . Obesity (BMI 30.0-34.9) 08/25/2014  . Vitamin D deficiency 08/25/2014  . Engages in travel abroad 08/25/2014  . Cervical disc disorder with radiculopathy 04/28/2013    Past Surgical History:  Procedure Laterality Date  . ABDOMINAL HYSTERECTOMY    . ANTERIOR CERVICAL DECOMP/DISCECTOMY FUSION N/A 06/07/2015   Procedure: Cervical three - four and Cervical six- seven anterior cervical decompression with fusion interbody prosthesis plating and bonegraft;  Surgeon: Newman Pies, MD;  Location: [place] [facility];  Service: Neurosurgery;  Laterality: N/A;  C34 and C67 anterior cervical decompression with fusion interbody prosthesis plating and bonegraft  . BACK SURGERY     x2 Lower   . DIALYSIS/PERMA CATHETER INSERTION N/A 08/17/2018   Procedure: DIALYSIS/PERMA CATHETER INSERTION;  Surgeon: Algernon Huxley, MD;  Location: [place] [facility];  Service: Cardiovascular;  Laterality: N/A;  . EVACUATION OF CERVICAL HEMATOMA N/A 06/14/2015   Procedure: EVACUATION OF CERVICAL HEMATOMA;  Surgeon: Newman Pies, MD;  Location: [place] [facility];  Service: Neurosurgery;  Laterality: N/A;  . NECK SURGERY     x3  . TONSILLECTOMY      Prior to Admission medications   Medication Sig Start Date End Date Taking? Authorizing Provider  albuterol (PROVENTIL HFA;VENTOLIN HFA) 108 (90 Base) MCG/ACT inhaler Inhale 2 puffs into the lungs every 6 (six) hours as needed for wheezing or shortness of breath. 10/04/16   Hubbard Hartshorn, FNP  albuterol (PROVENTIL) (2.5 MG/3ML) 0.083% nebulizer solution Take 3 mLs (2.5 mg total) by nebulization every 6  (six) hours as needed for wheezing or shortness of breath. 08/18/18   Steele Sizer, MD  Ascorbic Acid (VITAMIN C PO) Take 1 tablet by mouth daily.     [provider]  budesonide-formoterol (SYMBICORT) 160-4.5 MCG/ACT inhaler Inhale 2 puffs into the lungs 2 (two) times daily.    [provider]  Cholecalciferol (VITAMIN D-1000 MAX ST) 1000 UNITS tablet Take 1 tablet by mouth 2 (two) times daily.    [provider]  clonazePAM (KLONOPIN) 1 MG tablet Take 1 tablet (1 mg total) by mouth 2 (two) times daily. Patient taking differently: Take 1 mg by mouth 2 (two) times daily. And takes a 3rd one if needed for anxiety 08/18/18   Gladstone Lighter, MD  Cyanocobalamin (B-12 PO) Take 1 tablet by mouth daily.     [provider]  cyclobenzaprine (FLEXERIL) 10 MG tablet Take one tablet every 8 hours as needed 08/03/18   Lloyd Huger, MD  DULoxetine (CYMBALTA) 30 MG capsule Take 1 capsule (30 mg total) by mouth daily. 08/18/18   Gladstone Lighter, MD  ferrous sulfate (IRON SUPPLEMENT) 325 (65 FE) MG tablet Take 1 tablet by mouth daily.    [provider]  fluticasone (FLONASE) 50 MCG/ACT nasal spray Place 2 sprays  into both nostrils as needed. 06/11/18 09/09/18  Steele Sizer, MD  furosemide (LASIX) 80 MG tablet Take 1 tablet (80 mg total) by mouth 2 (two) times daily. 08/28/18 11/26/18  End, Harrell Gave, MD  hydrALAZINE (APRESOLINE) 25 MG tablet Take 1 tablet (25 mg total) by mouth 3 (three) times daily. 08/18/18   Gladstone Lighter, MD  ipratropium (ATROVENT HFA) 17 MCG/ACT inhaler Inhale 2 puffs into the lungs every 6 (six) hours as needed.     [provider]  isosorbide mononitrate (IMDUR) 30 MG 24 hr tablet Take 1 tablet (30 mg total) by mouth daily. 08/18/18   Gladstone Lighter, MD  levothyroxine (SYNTHROID) 25 MCG tablet Take 1 tablet (25 mcg total) by mouth daily before breakfast. 07/21/18   Borders, Kirt Boys, NP  lidocaine (XYLOCAINE) 2 % solution  Use as directed 15 mLs in the mouth or throat as needed for mouth pain. 11/28/17   Jacquelin Hawking, NP  Lysine 1000 MG TABS Take 1 tablet by mouth daily.     [provider]  Magnesium Oxide 400 (240 Mg) MG TABS Take 400 mg by mouth 2 (two) times daily.     [provider]  metoprolol succinate (TOPROL-XL) 50 MG 24 hr tablet Take 1 tablet (50 mg total) by mouth daily. Take with or immediately following a meal. 08/18/18   Gladstone Lighter, MD  Multiple Vitamins-Minerals (MULTIVITAMIN PO) Take 1 tablet by mouth daily.     [provider]  Omega-3 Fatty Acids (FISH OIL PO) Take 1 capsule by mouth 2 (two) times daily.     [provider]  ondansetron (ZOFRAN-ODT) 8 MG disintegrating tablet Take 1 tablet (8 mg total) by mouth every 8 (eight) hours as needed for nausea or vomiting. 07/20/18   Borders, Kirt Boys, NP  oxyCODONE-acetaminophen (PERCOCET) 10-325 MG tablet Take 1 tablet by mouth every 8 (eight) hours as needed for pain. 08/18/18   Gladstone Lighter, MD  SUMAtriptan (IMITREX) 100 MG tablet May repeat in 2 hours if headache persists or recurs. 06/10/18   Steele Sizer, MD  prochlorperazine (COMPAZINE) 10 MG tablet Take 1 tablet (10 mg total) by mouth every 6 (six) hours as needed (Nausea or vomiting). 12/02/16 08/08/17  Lloyd Huger, MD    Allergies Chlorhexidine  Family History  Problem Relation Age of Onset  . Depression Mother   . Migraines Mother   . Dementia Father   . Diabetes Father   . Hyperlipidemia Father   . Hyperlipidemia Brother   . Hyperlipidemia Brother   . Breast cancer Paternal Aunt        62s    Social History Social History   Tobacco Use  . Smoking status: Former Smoker    Packs/day: 1.50    Years: 20.00    Pack years: 30.00    Types: Cigarettes    Start date: 03/19/1979    Quit date: 08/25/1999    Years since quitting: 19.0  . Smokeless tobacco: Never Used  . Tobacco comment: smoking cessation materials not required   Substance Use Topics  . Alcohol use: Not Currently    Alcohol/week: 0.0 standard drinks  . Drug use: No    Review of Systems Constitutional: No fever/chills Eyes: No visual changes. ENT: No sore throat. No stiff neck no neck pain Cardiovascular: Denies chest pain. Respiratory: + shortness of breath. Gastrointestinal:   no vomiting.  No diarrhea.  No constipation. Genitourinary: Negative for dysuria. Musculoskeletal: Negative lower extremity swelling Skin: Negative for rash. Neurological:  Negative for severe headaches, focal weakness or numbness.   ____________________________________________   PHYSICAL EXAM:  VITAL SIGNS: ED Triage Vitals  Enc Vitals Group     BP 08/28/18 1434 (!) 176/99     Pulse Rate 08/28/18 1434 73     Resp 08/28/18 1434 14     Temp 08/28/18 1434 98.6 F (37 C)     Temp Source 08/28/18 1434 Oral     SpO2 08/28/18 1434 100 %     Weight 08/28/18 1435 160 lb (72.6 kg)     Height 08/28/18 1435 5\' 2"  (1.575 m)     Head Circumference --      Peak Flow --      Pain Score 08/28/18 1435 5     Pain Loc --      Pain Edu? --      Excl. in [place]? --     Constitutional: Alert and oriented.  [place] ill-appearing in no acute distress at this moment Eyes: Conjunctivae are normal Head: Atraumatic HEENT: No congestion/rhinnorhea. Mucous membranes are moist.  Oropharynx non-erythematous Neck:   Nontender with no meningismus, no masses, no stridor Cardiovascular: Normal rate, regular rhythm. Grossly normal heart sounds.  Good peripheral circulation. Respiratory: Normal respiratory effort.  No retractions. Lungs diminished in bilateral in the bases Abdominal: Soft and nontender. No distention. No guarding no rebound Back:  There is no focal tenderness or step off.  there is no midline tenderness there are no lesions noted. there is no CVA tenderness Musculoskeletal: No lower extremity tenderness, no upper extremity tenderness. No joint effusions, no DVT signs strong  distal pulses bilateral lower extremity chronic appearing edema Neurologic:  Normal speech and language. No gross focal neurologic deficits are appreciated.  Skin:  Skin is warm, dry and intact. No rash noted. Psychiatric: Mood and affect are normal. Speech and behavior are normal.  ____________________________________________   LABS (all labs ordered are listed, but only abnormal results are displayed)  Labs Reviewed  COMPREHENSIVE METABOLIC PANEL - Abnormal; Notable for the following components:      Result Value   Sodium 134 (*)    Chloride 95 (*)    Glucose, Bld 121 (*)    BUN 34 (*)    Creatinine, Ser 5.21 (*)    Calcium 7.9 (*)    Total Protein 5.5 (*)    Albumin 3.0 (*)    GFR calc non Af Amer 8 (*)    GFR calc Af Amer 9 (*)    All other components within normal limits  CBC WITH DIFFERENTIAL/PLATELET - Abnormal; Notable for the following components:   RBC 1.93 (*)    Hemoglobin 6.9 (*)    HCT 21.7 (*)    MCV 112.4 (*)    MCH 35.8 (*)    RDW 19.9 (*)    Platelets 72 (*)    All other components within normal limits  TROPONIN I - Abnormal; Notable for the following components:   Troponin I 0.07 (*)    All other components within normal limits  BRAIN NATRIURETIC PEPTIDE - Abnormal; Notable for the following components:   B Natriuretic Peptide >4,500.0 (*)    All other components within normal limits  SARS CORONAVIRUS 2 (HOSPITAL ORDER, [facility])    Pertinent labs  results that were available during my care of the patient were reviewed by me and considered in my medical decision making (see chart for details). ____________________________________________  EKG  I personally interpreted any EKGs  ordered by me or triage Sinus rhythm rate 73 bpm, normal axis no acute ST elevation or depression  ____________________________________________  RADIOLOGY  Pertinent labs & imaging results that were available during my care of the patient were  reviewed by me and considered in my medical decision making (see chart for details). If possible, patient and/or family made aware of any abnormal findings.  Dg Chest 2 View  Result Date: 08/28/2018 CLINICAL DATA:  History of ovarian cancer.  Pleural effusions. EXAM: CHEST - 2 VIEW COMPARISON:  Single-view of the chest 08/15/2018 and 08/13/2018. CT chest 08/09/2018. FINDINGS: Port-A-Cath and dialysis catheter are again seen. Dialysis catheter tip is now positioned just within the right atrium. There are small bilateral pleural effusions are new since the most recent exam. Pulmonary vascular congestion and cardiomegaly noted. No acute bony abnormality. IMPRESSION: Small bilateral pleural effusions are new since the most recent examination. Mild cardiomegaly and vascular congestion. Electronically Signed   By: Inge Rise M.D.   On: 08/28/2018 14:44   ____________________________________________    PROCEDURES  Procedure(s) performed: None  Procedures  Critical Care performed: None  ____________________________________________   INITIAL IMPRESSION / ASSESSMENT AND PLAN / ED COURSE  Pertinent labs & imaging results that were available during my care of the patient were reviewed by me and considered in my medical decision making (see chart for details).  Patient here with shortness of breath which is getting worse and weight loss despite dialysis and appropriate treatment at home, sats are reassuring on home oxygen here.  Recent dyspnea and is very medically fragile.  There was some talk about getting a PET scan which I initially did order at [facility] of cardiology, however, this cannot be obtained because she is only recently on dialysis and we did discuss with nephrology and they do not want to get the dye load.  The patient still makes urine.  This is not unreasonable and in addition, there is certainly more than enough reasons for this patient to be significantly dyspneic.  Sitting still  she does well however she states when she moves in her house she gets her winded she has to stop after a few steps.  She does not feel safe going home, her BNP is gone significantly higher during the course of the last couple weeks we will talk to nephrology again    ____________________________________________   FINAL CLINICAL IMPRESSION(S) / ED DIAGNOSES  Final diagnoses:  None      This chart was dictated using voice recognition software.  Despite best efforts to proofread,  errors can occur which can change meaning.      Schuyler Amor, MD 08/28/18 1719

## 2018-08-28 NOTE — Patient Instructions (Addendum)
  Medication Instructions:  START Lasix 80 mg TWICE per DAY If you need a refill on your cardiac medications before your next appointment, please call your pharmacy.   Lab work:TODAY CBC INR BNP CMP If you have labs (blood work) drawn today and your tests are completely normal, you will receive your results only by: Marland Kitchen MyChart Message (if you have MyChart) OR . A paper copy in the mail If you have any lab test that is abnormal or we need to change your treatment, we will call you to review the results.  Testing/Procedures: Your physician has requested that you have an echocardiogram. Echocardiography is a painless test that uses sound waves to create images of your heart. It provides your doctor with information about the size and shape of your heart and how well your heart's chambers and valves are working. This procedure takes approximately one hour. There are no restrictions for this procedure.    Follow-Up: At [facility], you and your health needs are our priority.  As part of our continuing mission to provide you with exceptional heart care, we have created designated Provider Care Teams.  These Care Teams include your primary Cardiologist (physician) and Advanced Practice Providers (APPs -  Physician Assistants and Nurse Practitioners) who all work together to provide you with the care you need, when you need it. You will need a follow up appointment in 1 weeks.  Please call our office 2 months in advance to schedule this appointment.  You may see Dr End or one of the following Advanced Practice Providers on your designated Care Team:   Murray Hodgkins, NP Christell Faith, PA-C . Marrianne Mood, PA-C  Any Other Special Instructions Will Be Listed Below (If Applicable).

## 2018-08-28 NOTE — Progress Notes (Signed)
PHARMACIST - PHYSICIAN ORDER COMMUNICATION  CONCERNING: P&T Medication Policy on Herbal Medications  DESCRIPTION:  This patient's order for: Lysine  has been noted.  This product(s) is classified as an "herbal" or natural product. Due to a lack of definitive safety studies or FDA approval, nonstandard manufacturing practices, plus the potential risk of unknown drug-drug interactions while on inpatient medications, the Pharmacy and Therapeutics Committee does not permit the use of "herbal" or natural products of this type within [place].   ACTION TAKEN: The pharmacy department is unable to verify this order at this time and your patient has been informed of this safety policy. Please reevaluate patient's clinical condition at discharge and address if the herbal or natural product(s) should be resumed at that time.  

## 2018-08-28 NOTE — Progress Notes (Signed)
Family Meeting Note  Advance Directive:yes  Today a meeting took place with the Patient.  Patient is able to participate  The following clinical team members were present during this meeting:MD  The following were discussed:Patient's diagnosis: 63 y.o. female with a known history per below which includes end-stage renal disease on hemodialysis, ovarian cancer-started on palliative chemo in December 2019-managed by Dr. Grayland Ormond, presents to ER at the request of cardiology/ Dr End for concern for acute fluid overload, worsening lower extremity swelling, orthopnea, in the emergency room patient was found to have BNP greater than 4500, hemoglobin 6.9-stable compared to previous, creatinine 5.2, sodium 134, chest x-ray noted for mild congestion, hospitalist asked to admit, patient evaluated in the emergency room, no apparent distress, resting comfortably in bed, patient now been admitted for acute on chronic diastolic congestive heart failure most likely secondary to acute fluid overload state from end-stage renal disease, Patient's progosis: Unable to determine and Goals for treatment: Full Code  Additional follow-up to be provided: prn  Time spent during discussion:20 minutes  Gorden Harms, MD

## 2018-08-28 NOTE — ED Notes (Signed)
ED TO INPATIENT HANDOFF REPORT  ED Nurse Name and Phone #: Kirke Shaggy [phone]  S Name/Age/Gender Jessica Burgess 63 y.o. female Room/Bed: ED10A/ED10A  Code Status   Code Status: Prior  Home/SNF/Other Home Patient oriented to: self, place, time and situation Is this baseline? Yes   Triage Complete: Triage complete  Chief Complaint Generalized Weakness  Triage Note Pt to ED brought from Cancer center c/o SOB, hypoxemia 89% had run out of oxygen, normally wears 2L [place].  Pt back to 100% 2L [place].  Has hx of stage 3 ovarian cancer, chemo since 2018.  Was at cancer center for checkup.  Also c/o new bilateral lower leg swelling.  Presents A&Ox4, chest rise even and unlabored, in NAD at this time.   Allergies Allergies  Allergen Reactions  . Chlorhexidine Itching    Pt reports itching this AM post wipes w/ CHG, no redness or irritation assessed    Level of Care/Admitting Diagnosis ED Disposition    ED Disposition Condition Comment   Admit  The patient appears reasonably stabilized for admission considering the current resources, flow, and capabilities available in the ED at this time, and I doubt any other [facility] requiring further screening and/or treatment in the ED prior to admission is  present.       B Medical/Surgery History Past Medical History:  Diagnosis Date  . Allergic rhinitis, cause unspecified   . Anxiety state, unspecified   . Arthritis   . Asthma    only when sick   . Backache, unspecified   . Bronchitis    hx of when get sick  . Cancer ([place])    skin cancer , basal cell   . Cancer ([place]) 11/2016   ovarian  . Cervicalgia   . Complication of anesthesia   . Dermatophytosis of nail   . Dysmetabolic syndrome X   . Encounter for long-term (current) use of other medications   . Esophageal reflux   . Insomnia, unspecified   . Leukocytosis, unspecified   . Migraine without aura, without mention of intractable migraine without mention of status migrainosus   . Other  and unspecified hyperlipidemia   . Other malaise and fatigue   . Overweight(278.02)   . Personal history of chemotherapy now   ovarian  . PONV (postoperative nausea and vomiting)   . Renal insufficiency   . Spinal stenosis in cervical region   . Symptomatic menopausal or female climacteric states   . Unspecified disorder of skin and subcutaneous tissue   . Unspecified vitamin D deficiency    Past Surgical History:  Procedure Laterality Date  . ABDOMINAL HYSTERECTOMY    . ANTERIOR CERVICAL DECOMP/DISCECTOMY FUSION N/A 06/07/2015   Procedure: Cervical three - four and Cervical six- seven anterior cervical decompression with fusion interbody prosthesis plating and bonegraft;  Surgeon: Newman Pies, MD;  Location: [place] [facility];  Service: Neurosurgery;  Laterality: N/A;  C34 and C67 anterior cervical decompression with fusion interbody prosthesis plating and bonegraft  . BACK SURGERY     x2 Lower   . DIALYSIS/PERMA CATHETER INSERTION N/A 08/17/2018   Procedure: DIALYSIS/PERMA CATHETER INSERTION;  Surgeon: Algernon Huxley, MD;  Location: [place] [facility];  Service: Cardiovascular;  Laterality: N/A;  . EVACUATION OF CERVICAL HEMATOMA N/A 06/14/2015   Procedure: EVACUATION OF CERVICAL HEMATOMA;  Surgeon: Newman Pies, MD;  Location: [place] [facility];  Service: Neurosurgery;  Laterality: N/A;  . NECK SURGERY     x3  . TONSILLECTOMY  A IV Location/Drains/Wounds Patient Lines/Drains/Airways Status   Active Line/Drains/Airways    Name:   Placement date:   Placement time:   Site:   Days:   Implanted Port 12/09/16 Right Chest   12/09/16    1124    Chest   627   Peripheral IV 08/28/18 Right Antecubital   08/28/18    1442    Antecubital   less than 1   Hemodialysis Catheter Left Subclavian Double-lumen   08/17/18    0800    Subclavian   11          Intake/Output Last 24 hours No intake or output data in the 24 hours ending 08/28/18 1736  Labs/Imaging Results for orders placed or  performed during the hospital encounter of 08/28/18 (from the past 48 hour(s))  Comprehensive metabolic panel     Status: Abnormal   Collection Time: 08/28/18  2:43 PM  Result Value Ref Range   Sodium 134 (L) 135 - 145 mmol/L   Potassium 4.3 3.5 - 5.1 mmol/L   Chloride 95 (L) 98 - 111 mmol/L   CO2 29 22 - 32 mmol/L   Glucose, Bld 121 (H) 70 - 99 mg/dL   BUN 34 (H) 8 - 23 mg/dL   Creatinine, Ser 5.21 (H) 0.44 - 1.00 mg/dL   Calcium 7.9 (L) 8.9 - 10.3 mg/dL   Total Protein 5.5 (L) 6.5 - 8.1 g/dL   Albumin 3.0 (L) 3.5 - 5.0 g/dL   AST 37 15 - 41 U/L   ALT 17 0 - 44 U/L   Alkaline Phosphatase 61 38 - 126 U/L   Total Bilirubin 0.8 0.3 - 1.2 mg/dL   GFR calc non Af Amer 8 (L) >60 mL/min   GFR calc Af Amer 9 (L) >60 mL/min   Anion gap 10 5 - 15    Comment: Performed at [facility], [address] [place], [place] [address]  CBC with Differential     Status: Abnormal   Collection Time: 08/28/18  2:43 PM  Result Value Ref Range   WBC 6.8 4.0 - 10.5 K/uL   RBC 1.93 (L) 3.87 - 5.11 MIL/uL   Hemoglobin 6.9 (L) 12.0 - 15.0 g/dL   HCT 21.7 (L) 36.0 - 46.0 %   MCV 112.4 (H) 80.0 - 100.0 fL   MCH 35.8 (H) 26.0 - 34.0 pg   MCHC 31.8 30.0 - 36.0 g/dL   RDW 19.9 (H) 11.5 - 15.5 %   Platelets 72 (L) 150 - 400 K/uL    Comment: Immature Platelet Fraction may be clinically indicated, consider ordering this additional test [id number]    nRBC 0.0 0.0 - 0.2 %   Neutrophils Relative % 70 %   Neutro Abs 4.7 1.7 - 7.7 K/uL   Lymphocytes Relative 13 %   Lymphs Abs 0.9 0.7 - 4.0 K/uL   Monocytes Relative 13 %   Monocytes Absolute 0.9 0.1 - 1.0 K/uL   Eosinophils Relative 4 %   Eosinophils Absolute 0.3 0.0 - 0.5 K/uL   Basophils Relative 0 %   Basophils Absolute 0.0 0.0 - 0.1 K/uL   WBC Morphology MORPHOLOGY UNREMARKABLE    Smear Review Normal platelet morphology    Immature Granulocytes 0 %   Abs Immature Granulocytes 0.02 0.00 - 0.07 K/uL   Dimorphism PRESENT     Comment: Performed  at [facility], [address] [place], [place] [address]  Troponin I - ONCE - STAT     Status:  Abnormal   Collection Time: 08/28/18  2:43 PM  Result Value Ref Range   Troponin I 0.07 (HH) <0.03 ng/mL    Comment: CRITICAL RESULT CALLED TO, READ BACK BY AND VERIFIED WITH Lateef Juncaj @1514  08/28/18 MJU Performed at [facility], [address] [place], [place] [address]   Brain natriuretic peptide     Status: Abnormal   Collection Time: 08/28/18  2:43 PM  Result Value Ref Range   B Natriuretic Peptide >4,500.0 (H) 0.0 - 100.0 pg/mL    Comment: RESULT CONFIRMED BY MANUAL DILUTION MJU Performed at [facility], [address] [place], [place] [address]   SARS Coronavirus 2 (CEPHEID - Performed in [place] hospital lab), Hosp Order     Status: None   Collection Time: 08/28/18  4:00 PM   Specimen: Nasopharyngeal Swab  Result Value Ref Range   SARS Coronavirus 2 NEGATIVE NEGATIVE    Comment: (NOTE) If result is NEGATIVE SARS-CoV-2 target nucleic acids are NOT DETECTED. The SARS-CoV-2 RNA is generally detectable in upper and lower  respiratory specimens during the acute phase of infection. The lowest  concentration of SARS-CoV-2 viral copies this assay can detect is 250  copies / mL. A negative result does not preclude SARS-CoV-2 infection  and should not be used as the sole basis for treatment or other  patient management decisions.  A negative result may occur with  improper specimen collection / handling, submission of specimen other  than nasopharyngeal swab, presence of viral mutation(s) within the  areas targeted by this assay, and inadequate number of viral copies  (<250 copies / mL). A negative result must be combined with clinical  observations, patient history, and epidemiological information. If result is POSITIVE SARS-CoV-2 target nucleic acids are DETECTED. The SARS-CoV-2 RNA is generally detectable in upper and lower  respiratory  specimens dur ing the acute phase of infection.  Positive  results are indicative of active infection with SARS-CoV-2.  Clinical  correlation with patient history and other diagnostic information is  necessary to determine patient infection status.  Positive results do  not rule out bacterial infection or co-infection with other viruses. If result is PRESUMPTIVE POSTIVE SARS-CoV-2 nucleic acids MAY BE PRESENT.   A presumptive positive result was obtained on the submitted specimen  and confirmed on repeat testing.  While 2019 novel coronavirus  (SARS-CoV-2) nucleic acids may be present in the submitted sample  additional confirmatory testing may be necessary for epidemiological  and / or clinical management purposes  to differentiate between  SARS-CoV-2 and other Sarbecovirus currently known to infect humans.  If clinically indicated additional testing with an alternate test  methodology [phone]) is advised. The SARS-CoV-2 RNA is generally  detectable in upper and lower respiratory sp ecimens during the acute  phase of infection. The expected result is Negative. Fact Sheet for Patients:  StrictlyIdeas.no Fact Sheet for Healthcare Providers: BankingDealers.co.za This test is not yet approved or cleared by the [place] FDA and has been authorized for detection and/or diagnosis of SARS-CoV-2 by FDA under an Emergency Use Authorization (EUA).  This EUA will remain in effect (meaning this test can be used) for the duration of the COVID-19 declaration under Section 564(b)(1) of the Act, 21 U.S.C. section 360bbb-3(b)(1), unless the authorization is terminated or revoked sooner. Performed at [facility], [address], [place], [place] [address]    Dg Chest 2 View  Result Date: 08/28/2018 CLINICAL DATA:  History of ovarian cancer.  Pleural effusions.  EXAM: CHEST - 2 VIEW COMPARISON:  Single-view of the chest 08/15/2018 and  08/13/2018. CT chest 08/09/2018. FINDINGS: Port-A-Cath and dialysis catheter are again seen. Dialysis catheter tip is now positioned just within the right atrium. There are small bilateral pleural effusions are new since the most recent exam. Pulmonary vascular congestion and cardiomegaly noted. No acute bony abnormality. IMPRESSION: Small bilateral pleural effusions are new since the most recent examination. Mild cardiomegaly and vascular congestion. Electronically Signed   By: Inge Rise M.D.   On: 08/28/2018 14:44    Pending Labs Unresulted Labs (From admission, onward)   None      Vitals/Pain Today's Vitals   08/28/18 1434 08/28/18 1435 08/28/18 1724  BP: (!) 176/99  (!) 174/95  Pulse: 73  70  Resp: 14  (!) 22  Temp: 98.6 F (37 C)    TempSrc: Oral    SpO2: 100%  100%  Weight:  72.6 kg   Height:  5\' 2"  (1.575 m)   PainSc:  5      Isolation Precautions No active isolations  Medications Medications  iohexol (OMNIPAQUE) 350 MG/ML injection 75 mL (has no administration in time range)  diphenhydrAMINE (BENADRYL) injection 12.5 mg (12.5 mg Intravenous Given 08/28/18 1558)    Mobility walks Low fall risk   Focused Assessments Pulmonary Assessment Handoff:  Lung sounds:   O2 Device: Room Air        R Recommendations: See Admitting Provider Note  Report given to:   Additional Notes:  dialysis catheter to left chest

## 2018-08-28 NOTE — ED Notes (Signed)
Patient questioning what is the next step. This RN will speak with MD Mcshane and then update patient

## 2018-08-28 NOTE — Progress Notes (Signed)
[place]  [phone] [phone]  ID: Jessica Burgess OB: [birth year]  MR#: [id number]  ZYY#:[id number]  Patient Care Team: Steele Sizer, MD as PCP - General (Family Medicine) Lloyd Huger, MD as Consulting Physician (Oncology) Mellody Drown, MD as Consulting Physician (Obstetrics and Gynecology) Cathi Roan, [facility] (Pharmacist) Benedetto Goad, RN as Case Manager Clent Jacks, RN as Registered Nurse  CHIEF COMPLAINT: Stage IIIc high-grade serous ovarian carcinoma, end-stage renal 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  INTERVAL HISTORY: Patient seen in clinic as an add-on today with worsening shortness of breath.  She was seen earlier this morning in cardiology clinic with Dr. Saunders Revel.  She continues to have a decreased performance status.  She has chronic shortness of breath requiring oxygen she continues to have weakness and fatigue and a poor appetite.  She denies any fevers.  She has no neurologic complaints.  She denies any chest pain, cough, or hemoptysis. She denies any nausea, vomiting, constipation, or diarrhea.  She denies any abdominal pain or bloating.  Patient feels generally terrible, but offers no further specific complaints today.  REVIEW OF SYSTEMS:   Review of Systems  Constitutional: Positive for malaise/fatigue. Negative for fever and weight loss.  HENT: Negative.  Negative for sore throat.   Eyes: Negative.  Negative for blurred vision.  Respiratory: Positive for shortness of breath. Negative for cough.   Cardiovascular: Positive for leg swelling. Negative for chest pain.  Gastrointestinal: Negative.  Negative for abdominal pain, blood in stool, constipation, diarrhea, melena, nausea and vomiting.  Genitourinary: Negative.  Negative for dysuria.  Musculoskeletal: Positive for back pain and joint pain. Negative for neck pain.  Skin: Negative.  Negative for itching and rash.  Neurological: Positive for  tingling, sensory change and weakness. Negative for focal weakness.  Psychiatric/Behavioral: The patient is nervous/anxious.      As per HPI. Otherwise, a complete review of systems is negative.  PAST MEDICAL HISTORY: Past Medical History:  Diagnosis Date   Allergic rhinitis, cause unspecified    Anxiety state, unspecified    Arthritis    Asthma    only when sick    Backache, unspecified    Bronchitis    hx of when get sick   Cancer [facility])    skin cancer , basal cell    Cancer ([place]) 11/2016   ovarian   Cervicalgia    Complication of anesthesia    Dermatophytosis of nail    Dysmetabolic syndrome X    Encounter for long-term (current) use of other medications    Esophageal reflux    Insomnia, unspecified    Leukocytosis, unspecified    Migraine without aura, without mention of intractable migraine without mention of status migrainosus    Other and unspecified hyperlipidemia    Other malaise and fatigue    Overweight(278.02)    Personal history of chemotherapy now   ovarian   PONV (postoperative nausea and vomiting)    Spinal stenosis in cervical region    Symptomatic menopausal or female climacteric states    Unspecified disorder of skin and subcutaneous tissue    Unspecified vitamin D deficiency     PAST SURGICAL HISTORY: Past Surgical History:  Procedure Laterality Date   ABDOMINAL HYSTERECTOMY     ANTERIOR CERVICAL DECOMP/DISCECTOMY FUSION N/A 06/07/2015   Procedure: Cervical three - four and Cervical six- seven anterior cervical decompression with fusion interbody prosthesis plating and bonegraft;  Surgeon: Newman Pies, MD;  Location: [place] [facility];  Service:  Neurosurgery;  Laterality: N/A;  C34 and C67 anterior cervical decompression with fusion interbody prosthesis plating and bonegraft   BACK SURGERY     x2 Lower    DIALYSIS/PERMA CATHETER INSERTION N/A 08/17/2018   Procedure: DIALYSIS/PERMA CATHETER INSERTION;  Surgeon: Algernon Huxley, MD;  Location: [place] [facility];  Service: Cardiovascular;  Laterality: N/A;   EVACUATION OF CERVICAL HEMATOMA N/A 06/14/2015   Procedure: EVACUATION OF CERVICAL HEMATOMA;  Surgeon: Newman Pies, MD;  Location: [place] [facility];  Service: Neurosurgery;  Laterality: N/A;   NECK SURGERY     x3   TONSILLECTOMY      FAMILY HISTORY: Family History  Problem Relation Age of Onset   Depression Mother    Migraines Mother    Dementia Father    Diabetes Father    Hyperlipidemia Father    Hyperlipidemia Brother    Hyperlipidemia Brother    Breast cancer Paternal Aunt        93s    ADVANCED DIRECTIVES (Y/N):  N  HEALTH MAINTENANCE: Social History   Tobacco Use   Smoking status: Former Smoker    Packs/day: 1.50    Years: 20.00    Pack years: 30.00    Types: Cigarettes    Start date: 03/19/1979    Quit date: 08/25/1999    Years since quitting: 19.0   Smokeless tobacco: Never Used   Tobacco comment: smoking cessation materials not required  Substance Use Topics   Alcohol use: Not Currently    Alcohol/week: 0.0 standard drinks   Drug use: No     Colonoscopy:  PAP:  Bone density:  Lipid panel:  Allergies  Allergen Reactions   Chlorhexidine Itching    Pt reports itching this AM post wipes w/ CHG, no redness or irritation assessed    No current facility-administered medications for this visit.    Current Outpatient Medications  Medication Sig Dispense Refill   albuterol (PROVENTIL HFA;VENTOLIN HFA) 108 (90 Base) MCG/ACT inhaler Inhale 2 puffs into the lungs every 6 (six) hours as needed for wheezing or shortness of breath. 1 Inhaler 2   albuterol (PROVENTIL) (2.5 MG/3ML) 0.083% nebulizer solution Take 3 mLs (2.5 mg total) by nebulization every 6 (six) hours as needed for wheezing or shortness of breath. 75 mL 2   Ascorbic Acid (VITAMIN C PO) Take 1 tablet by mouth daily.      budesonide-formoterol (SYMBICORT) 160-4.5 MCG/ACT inhaler Inhale 2 puffs into  the lungs 2 (two) times daily.     Cholecalciferol (VITAMIN D-1000 MAX ST) 1000 UNITS tablet Take 1 tablet by mouth 2 (two) times daily.     clonazePAM (KLONOPIN) 1 MG tablet Take 1 tablet (1 mg total) by mouth 2 (two) times daily. (Patient taking differently: Take 1 mg by mouth 2 (two) times daily. And takes a 3rd one if needed for anxiety) 30 tablet 0   Cyanocobalamin (B-12 PO) Take 1 tablet by mouth daily.      cyclobenzaprine (FLEXERIL) 10 MG tablet Take one tablet every 8 hours as needed 30 tablet 3   DULoxetine (CYMBALTA) 30 MG capsule Take 1 capsule (30 mg total) by mouth daily. 30 capsule 1   ferrous sulfate (IRON SUPPLEMENT) 325 (65 FE) MG tablet Take 1 tablet by mouth daily.     fluticasone (FLONASE) 50 MCG/ACT nasal spray Place 2 sprays into both nostrils as needed. 48 g 1   furosemide (LASIX) 80 MG tablet Take 1 tablet (80 mg total) by mouth 2 (two) times daily.  180 tablet 3   hydrALAZINE (APRESOLINE) 25 MG tablet Take 1 tablet (25 mg total) by mouth 3 (three) times daily. 90 tablet 1   ipratropium (ATROVENT HFA) 17 MCG/ACT inhaler Inhale 2 puffs into the lungs every 6 (six) hours as needed.      isosorbide mononitrate (IMDUR) 30 MG 24 hr tablet Take 1 tablet (30 mg total) by mouth daily. 30 tablet 2   levothyroxine (SYNTHROID) 25 MCG tablet Take 1 tablet (25 mcg total) by mouth daily before breakfast. 30 tablet 2   lidocaine (XYLOCAINE) 2 % solution Use as directed 15 mLs in the mouth or throat as needed for mouth pain. 100 mL 0   Lysine 1000 MG TABS Take 1 tablet by mouth daily.      Magnesium Oxide 400 (240 Mg) MG TABS Take 400 mg by mouth 2 (two) times daily.      metoprolol succinate (TOPROL-XL) 50 MG 24 hr tablet Take 1 tablet (50 mg total) by mouth daily. Take with or immediately following a meal. 30 tablet 2   Multiple Vitamins-Minerals (MULTIVITAMIN PO) Take 1 tablet by mouth daily.      Omega-3 Fatty Acids (FISH OIL PO) Take 1 capsule by mouth 2 (two) times  daily.      ondansetron (ZOFRAN-ODT) 8 MG disintegrating tablet Take 1 tablet (8 mg total) by mouth every 8 (eight) hours as needed for nausea or vomiting. 60 tablet 3   oxyCODONE-acetaminophen (PERCOCET) 10-325 MG tablet Take 1 tablet by mouth every 8 (eight) hours as needed for pain. 20 tablet 0   SUMAtriptan (IMITREX) 100 MG tablet May repeat in 2 hours if headache persists or recurs. 9 tablet 0   Facility-Administered Medications Ordered in Other Visits  Medication Dose Route Frequency Provider Last Rate Last Dose   0.9 %  sodium chloride infusion   Intravenous Once Grayland Ormond, Kathlene November, MD       0.9 %  sodium chloride infusion   Intravenous Once Grayland Ormond, Kathlene November, MD       dexamethasone (DECADRON) 20 mg in sodium chloride 0.9 % 50 mL IVPB  20 mg Intravenous Once Lloyd Huger, MD       dexamethasone (DECADRON) injection 10 mg  10 mg Intravenous Once Lloyd Huger, MD       sodium chloride flush (NS) 0.9 % injection 10 mL  10 mL Intravenous PRN Lloyd Huger, MD   10 mL at 06/16/17 0854    OBJECTIVE: Vitals:   08/28/18 1330 08/28/18 1345  BP: (!) 166/79   Pulse: 77   Resp: (!) 24   Temp: (!) 97 F (36.1 C)   SpO2: (!) 87% 100%     There is no height or weight on file to calculate BMI.    ECOG FS:0 - Asymptomatic  General: Ill-appearing, moderate respiratory distress. Eyes: Pink conjunctiva, anicteric sclera. HEENT: Normocephalic, moist mucous membranes. Lungs: Clear to auscultation bilaterally. Heart: Regular rate and rhythm. No rubs, murmurs, or gallops. Abdomen: Soft, nontender, nondistended. No organomegaly noted, normoactive bowel sounds. Musculoskeletal: No edema, cyanosis, or clubbing. Neuro: Alert, answering all questions appropriately. Cranial nerves grossly intact. Skin: No rashes or petechiae noted. Psych: Normal affect.  LAB RESULTS:  Lab Results  Component Value Date   NA 134 (L) 08/28/2018   K 4.3 08/28/2018   CL 95 (L) 08/28/2018     CO2 29 08/28/2018   GLUCOSE 121 (H) 08/28/2018   BUN 34 (H) 08/28/2018   CREATININE 5.21 (H) 08/28/2018  CALCIUM 7.9 (L) 08/28/2018   PROT 5.5 (L) 08/28/2018   ALBUMIN 3.0 (L) 08/28/2018   AST 37 08/28/2018   ALT 17 08/28/2018   ALKPHOS 61 08/28/2018   BILITOT 0.8 08/28/2018   GFRNONAA 8 (L) 08/28/2018   GFRAA 9 (L) 08/28/2018    Lab Results  Component Value Date   WBC 6.8 08/28/2018   NEUTROABS 4.7 08/28/2018   HGB 6.9 (L) 08/28/2018   HCT 21.7 (L) 08/28/2018   MCV 112.4 (H) 08/28/2018   PLT 72 (L) 08/28/2018     STUDIES: Dg Chest 2 View  Result Date: 08/28/2018 CLINICAL DATA:  History of ovarian cancer.  Pleural effusions. EXAM: CHEST - 2 VIEW COMPARISON:  Single-view of the chest 08/15/2018 and 08/13/2018. CT chest 08/09/2018. FINDINGS: Port-A-Cath and dialysis catheter are again seen. Dialysis catheter tip is now positioned just within the right atrium. There are small bilateral pleural effusions are new since the most recent exam. Pulmonary vascular congestion and cardiomegaly noted. No acute bony abnormality. IMPRESSION: Small bilateral pleural effusions are new since the most recent examination. Mild cardiomegaly and vascular congestion. Electronically Signed   By: Inge Rise M.D.   On: 08/28/2018 14:44   Dg Chest 2 View  Result Date: 08/12/2018 CLINICAL DATA:  Shortness of breath EXAM: CHEST - 2 VIEW COMPARISON:  08/09/2018 CT, chest x-ray 08/07/2018 FINDINGS: Right Port-A-Cath remains in place, unchanged. Cardiomegaly with vascular congestion. Patchy opacities in the left mid and lower lung could reflect asymmetric edema or infection. Small bilateral effusions. No acute bony abnormality. IMPRESSION: Cardiomegaly with vascular congestion. Left mid and lower lung airspace opacity could reflect asymmetric edema or infection. Small effusions. Electronically Signed   By: Rolm Baptise M.D.   On: 08/12/2018 12:03   Ct Chest Wo Contrast  Result Date:  08/09/2018 CLINICAL DATA:  Hemoptysis, ovarian cancer EXAM: CT CHEST WITHOUT CONTRAST TECHNIQUE: Multidetector CT imaging of the chest was performed following the standard protocol without IV contrast. COMPARISON:  PET-CT, 05/07/2018, CT chest, 02/02/2018 FINDINGS: Cardiovascular: Cardiomegaly. Trace pericardial effusion. Right chest port catheter. Mediastinum/Nodes: No enlarged mediastinal, hilar, or axillary lymph nodes. Thyroid gland, trachea, and esophagus demonstrate no significant findings. Lungs/Pleura: There is scattered bilateral ground-glass and clustered nodular pulmonary opacity, most conspicuous in the bilateral upper lobes and new compared to prior PET-CT dated 05/07/2018 (series 3, image 58). There is a new 3 mm subpleural pulmonary nodule of the left pulmonary apex (series 3, image 23) and a new 4 mm subpleural nodule of the right middle lobe (series 3, image 70). There is a thin-walled pneumatocele unchanged from prior examination. Small bilateral pleural effusions and associated atelectasis or consolidation. Upper Abdomen: No acute abnormality. Musculoskeletal: No chest wall mass or suspicious bone lesions identified. IMPRESSION: 1. There is scattered bilateral ground-glass and clustered nodular pulmonary opacity, most conspicuous in the bilateral upper lobes and new compared to prior PET-CT dated 05/07/2018 (series 3, image 58). Small bilateral pleural effusions and associated atelectasis or consolidation. Findings are consistent with atypical infection or inflammation. Drug toxicity is a significant differential consideration in the setting of malignancy. 2. There is a new 3 mm subpleural pulmonary nodule of the left pulmonary apex (series 3, image 23) and a new 4 mm subpleural nodule of the right middle lobe (series 3, image 70). These are nonspecific and may be infectious or inflammatory although concerning for metastatic disease in the setting of primary ovarian malignancy. Attention on  follow-up. Electronically Signed   By: Dorna Bloom.D.  On: 08/09/2018 12:25   US Venous Img Lower Bilateral  Result Date: 08/07/2018 CLINICAL DATA:  Elevated D-dimer. History of ovarian cancer. Evaluate for DVT. EXAM: BILATERAL LOWER EXTREMITY VENOUS DOPPLER ULTRASOUND TECHNIQUE: Gray-scale sonography with graded compression, as well as color Doppler and duplex ultrasound were performed to evaluate the lower extremity deep venous systems from the level of the common femoral vein and including the common femoral, femoral, profunda femoral, popliteal and calf veins including the posterior tibial, peroneal and gastrocnemius veins when visible. The superficial great saphenous vein was also interrogated. Spectral Doppler was utilized to evaluate flow at rest and with distal augmentation maneuvers in the common femoral, femoral and popliteal veins. COMPARISON:  None. FINDINGS: RIGHT LOWER EXTREMITY Common Femoral Vein: No evidence of thrombus. Normal compressibility, respiratory phasicity and response to augmentation. Saphenofemoral Junction: No evidence of thrombus. Normal compressibility and flow on color Doppler imaging. Profunda Femoral Vein: No evidence of thrombus. Normal compressibility and flow on color Doppler imaging. Femoral Vein: No evidence of thrombus. Normal compressibility, respiratory phasicity and response to augmentation. Popliteal Vein: No evidence of thrombus. Normal compressibility, respiratory phasicity and response to augmentation. Calf Veins: No evidence of thrombus. Normal compressibility and flow on color Doppler imaging. Superficial Great Saphenous Vein: No evidence of thrombus. Normal compressibility. Venous Reflux:  None. Other Findings:  None. LEFT LOWER EXTREMITY Common Femoral Vein: No evidence of thrombus. Normal compressibility, respiratory phasicity and response to augmentation. Saphenofemoral Junction: No evidence of thrombus. Normal compressibility and flow on color Doppler  imaging. Profunda Femoral Vein: No evidence of thrombus. Normal compressibility and flow on color Doppler imaging. Femoral Vein: No evidence of thrombus. Normal compressibility, respiratory phasicity and response to augmentation. Popliteal Vein: No evidence of thrombus. Normal compressibility, respiratory phasicity and response to augmentation. Calf Veins: No evidence of thrombus. Normal compressibility and flow on color Doppler imaging. Superficial Great Saphenous Vein: No evidence of thrombus. Normal compressibility. Venous Reflux:  None. Other Findings: Note made of an approximately 2.3 x 3.6 x 0.8 cm serpiginous fluid collection with the left popliteal fossa. IMPRESSION: 1. No evidence of DVT within either lower extremity. 2. Incidentally noted approximately 3.6 cm left-sided Baker's cyst. Electronically Signed   By: Sandi Mariscal M.D.   On: 08/07/2018 07:49   Dg Chest Port 1 View  Result Date: 08/15/2018 CLINICAL DATA:  Catheter placement EXAM: PORTABLE CHEST 1 VIEW COMPARISON:  08/13/2018, 08/12/2018, 08/07/2018 FINDINGS: Right-sided central venous port tip over the SVC. New left IJ central venous catheter tip over the SVC. No pneumothorax. Cardiomegaly with vascular congestion and bilateral perihilar hazy and ground-glass opacities suspect for edema. Small bilateral effusions. Left basilar consolidation. IMPRESSION: 1. Left IJ central venous catheter tip over the SVC. No pneumothorax. 2. Cardiomegaly with vascular congestion, moderate pulmonary edema and continued small pleural effusions. Left basilar consolidation without change. Electronically Signed   By: Donavan Foil M.D.   On: 08/15/2018 03:57   Dg Chest Port 1 View  Result Date: 08/13/2018 CLINICAL DATA:  Shortness of breath. EXAM: PORTABLE CHEST 1 VIEW COMPARISON:  Radiographs of Aug 12, 2018. FINDINGS: Stable cardiomegaly with central pulmonary vascular congestion. Right internal jugular Port-A-Cath is again noted. No pneumothorax is noted.  Increased interstitial densities are noted throughout both lungs most consistent with pulmonary edema. Small bilateral pleural effusions are noted. Bony thorax is unremarkable. IMPRESSION: Stable cardiomegaly with central pulmonary vascular congestion. Increased bilateral interstitial densities are noted consistent with worsening pulmonary edema. Small bilateral pleural effusions are noted. Electronically Signed   By:  Marijo Conception M.D.   On: 08/13/2018 13:57   Dg Chest Port 1 View  Result Date: 08/07/2018 CLINICAL DATA:  Acute CHF EXAM: PORTABLE CHEST 1 VIEW COMPARISON:  Yesterday FINDINGS: Cardiomegaly and vascular pedicle widening. Continued perihilar predominant interstitial airspace opacity. No evidence of pleural effusion or pneumothorax. Port on the right with tip at the SVC. IMPRESSION: Continued CHF pattern. Electronically Signed   By: Monte Fantasia M.D.   On: 08/07/2018 06:15   Dg Chest Portable 1 View  Result Date: 08/06/2018 CLINICAL DATA:  Shortness of breath EXAM: PORTABLE CHEST 1 VIEW COMPARISON:  08/06/2018, 06/29/2018, PET-CT 05/07/2018 FINDINGS: Surgical hardware in the cervical spine. Right-sided central venous port tip over the SVC. Cardiomegaly with vascular congestion. Diffuse interstitial opacity, no significant change as compared with radiograph performed earlier today. No pleural effusion or pneumothorax. IMPRESSION: No significant interval change in cardiomegaly, mild vascular congestion and diffuse interstitial opacity since radiograph performed earlier today. Electronically Signed   By: Donavan Foil M.D.   On: 08/06/2018 23:40   Dg Chest Portable 1 View  Result Date: 08/06/2018 CLINICAL DATA:  63 year old female with respiratory distress, shortness of breath. Recent weight gain. Hypertensive on presentation. Pending COVID-19 status. History of ovarian cancer. EXAM: PORTABLE CHEST 1 VIEW COMPARISON:  06/29/2018 and earlier. FINDINGS: Portable AP semi upright view at 2001  hours. Stable right chest power port. Stable cardiac size and mediastinal contours. Lower lung volumes with ongoing diffuse pulmonary interstitial opacity, basilar predominance. This is similar to but increased compared to April. No superimposed pneumothorax, pleural effusion or consolidation. Prior cervical ACDF. No acute osseous abnormality identified. IMPRESSION: Lower lung volumes with ongoing diffuse pulmonary interstitial opacity, similar to but increased compared to April. Differential considerations include viral/atypical respiratory infection, pulmonary edema, less likely lymphangitic carcinomatosis. Electronically Signed   By: Genevie Ann M.D.   On: 08/06/2018 20:28   Dg Abd Portable 1v  Result Date: 08/10/2018 CLINICAL DATA:  Dialysis catheter insertion. EXAM: PORTABLE ABDOMEN - 1 VIEW COMPARISON:  Abdominal CT 08/06/2018. FINDINGS: Right femoral dialysis catheter extends to the L2-3 disc space level, corresponding with the infrarenal IVC on prior CT. The bowel gas pattern is normal. Patient is status post cholecystectomy and L4-5 fusion. IMPRESSION: Hemodialysis catheter tip at the L2-3 disc space level. Electronically Signed   By: Richardean Sale M.D.   On: 08/10/2018 15:35   Ct Renal Stone Study  Result Date: 08/06/2018 CLINICAL DATA:  History of ovarian cancer. Congestive heart failure. Decreasing GFR. Evaluate for renal obstruction. EXAM: CT ABDOMEN AND PELVIS WITHOUT CONTRAST TECHNIQUE: Multidetector CT imaging of the abdomen and pelvis was performed following the standard protocol without IV contrast. COMPARISON:  05/07/2018 PET-CT. 02/02/2018 CT abdomen and pelvis. FINDINGS: Lower chest: Interlobular septal thickening and hazy central ground-glass opacifications of the lung bases. Small bilateral pleural effusions. Mild cardiomegaly. Hepatobiliary: No focal liver abnormality is seen. Status post cholecystectomy. No biliary dilatation. Pancreas: Unremarkable. No pancreatic ductal dilatation or  surrounding inflammatory changes. Spleen: Normal in size without focal abnormality. Adrenals/Urinary Tract: Adrenal glands are unremarkable. Kidneys are normal, without renal calculi, focal lesion, or hydronephrosis. Bladder is unremarkable. Stomach/Bowel: Stomach is within normal limits. Appendix appears normal. No evidence of bowel wall thickening, distention, or inflammatory changes. Vascular/Lymphatic: Aortic atherosclerosis. No enlarged abdominal or pelvic lymph nodes. Reproductive: Status post hysterectomy. No adnexal masses. Other: Supraumbilical hernia containing fat is mildly increased in size. Stable postsurgical changes within the ventral abdominal wall. Nodularity of the left upper quadrant omentum is  stable from prior PET-CT (series 2, image 33). Small volume of ascites. Mild edema within the subcutaneous soft tissues. Musculoskeletal: No fracture is seen. L4-5 posterior instrumented fusion and laminectomy chronic postsurgical changes. No apparent hardware related complication. IMPRESSION: 1. No hydronephrosis or urinary stone disease identified. Unremarkable non-contrast CT appearance of the kidneys. 2. Interstitial and alveolar pulmonary edema, small bilateral pleural effusions, small volume of ascites, mild edema within subcutaneous soft tissues. 3. Stable nodularity of the left upper quadrant omentum from prior PET-CT. Electronically Signed   By: Kristine Garbe M.D.   On: 08/06/2018 22:31    ONCOLOGY HISTORY: Patient underwent debulking surgery at [facility] on November 19, 2016. She received one infusion preoperative infusion of Keytruda on November 11, 2016 as part of a clinical trial.  She initiated adjuvant carboplatinum and Taxol on December 16, 2016.  This was continued through April 28, 2017 at which time Taxol was discontinued secondary to worsening neuropathy.  Patient then received single agent carboplatinum for additional 2 infusions.  She was then noted to have  progression of disease and was switched to Avastin and doxorubicin on August 18, 2017 this was discontinued on November 24, 2017 secondary to declining performance status.  Palliative gemcitabine was initiated on February 20, 2018.   ASSESSMENT: Stage IIIc high-grade serous ovarian carcinoma  PLAN:  1. Stage IIIc high-grade serous ovarian carcinoma: See oncology history as above.  Patient last received treatment with single agent gemcitabine on Jul 27, 2018.  Her last CA-125 on Jul 27, 2018 was reported 75.4.  Given patient's renal failure and declining performance status no further treatments are planned at this time.  2.  Shortness of breath: Likely multifactorial.  Patient on dialysis and appears to be fluid overloaded.  She was also seen by cardiology today.  It appears she has pulmonary hypertension.  Consider PE protocol CT scan to assess for possible clot. 3.  Acute renal failure: Continue dialysis as scheduled.  Patient will need nephrology consult. 4.  Pain: Patient does not complain of this today.  Continue current narcotic regimen. 5.  Anemia: Patient's hemoglobin is 6.9.  She will require at least 1 to 2 units of packed red blood cells.  This will likely have to be done in conjunction with dialysis given her current fluid overload. 6.  Thrombocytopenia: Chronic and unchanged, monitor. 7.  Disposition: Patient has been sent to the emergency room for further evaluation and admission to the hospital.  I spent a total of 30 minutes face-to-face with the patient of which greater than 50% of the visit was spent in counseling and coordination of care as detailed above.  Lloyd Huger, MD   08/28/2018 3:40 PM

## 2018-08-28 NOTE — H&P (Addendum)
[facility] NAME: Jessica Burgess    MR#:  [id number]  DATE OF BIRTH:  Dec 30, 1955  DATE OF ADMISSION:  08/28/2018  PRIMARY CARE PHYSICIAN: Steele Sizer, MD   REQUESTING/REFERRING PHYSICIAN:   CHIEF COMPLAINT:   Chief Complaint  Patient presents with  . Shortness of Breath    HISTORY OF PRESENT ILLNESS: Jessica Burgess  is a 63 y.o. female with a known history per below which includes end-stage renal disease on hemodialysis, ovarian cancer-started on palliative chemo in December 2019-managed by Dr. Grayland Ormond, presents to ER at the request of cardiology/ Dr End for concern for acute fluid overload, worsening lower extremity swelling, orthopnea, in the emergency room patient was found to have BNP greater than 4500, hemoglobin 6.9-stable compared to previous, creatinine 5.2, sodium 134, chest x-ray noted for mild congestion, hospitalist asked to admit, patient evaluated in the emergency room, no apparent distress, resting comfortably in bed, patient now been admitted for acute on chronic diastolic congestive heart failure most likely secondary to acute fluid overload state from end-stage renal disease.  PAST MEDICAL HISTORY:   Past Medical History:  Diagnosis Date  . Allergic rhinitis, cause unspecified   . Anxiety state, unspecified   . Arthritis   . Asthma    only when sick   . Backache, unspecified   . Bronchitis    hx of when get sick  . Cancer ([place])    skin cancer , basal cell   . Cancer ([place]) 11/2016   ovarian  . Cervicalgia   . Complication of anesthesia   . Dermatophytosis of nail   . Dysmetabolic syndrome X   . Encounter for long-term (current) use of other medications   . Esophageal reflux   . Insomnia, unspecified   . Leukocytosis, unspecified   . Migraine without aura, without mention of intractable migraine without mention of status migrainosus   . Other and unspecified hyperlipidemia   . Other malaise and  fatigue   . Overweight(278.02)   . Personal history of chemotherapy now   ovarian  . PONV (postoperative nausea and vomiting)   . Renal insufficiency   . Spinal stenosis in cervical region   . Symptomatic menopausal or female climacteric states   . Unspecified disorder of skin and subcutaneous tissue   . Unspecified vitamin D deficiency     PAST SURGICAL HISTORY:  Past Surgical History:  Procedure Laterality Date  . ABDOMINAL HYSTERECTOMY    . ANTERIOR CERVICAL DECOMP/DISCECTOMY FUSION N/A 06/07/2015   Procedure: Cervical three - four and Cervical six- seven anterior cervical decompression with fusion interbody prosthesis plating and bonegraft;  Surgeon: Newman Pies, MD;  Location: [facility];  Service: Neurosurgery;  Laterality: N/A;  C34 and C67 anterior cervical decompression with fusion interbody prosthesis plating and bonegraft  . BACK SURGERY     x2 Lower   . DIALYSIS/PERMA CATHETER INSERTION N/A 08/17/2018   Procedure: DIALYSIS/PERMA CATHETER INSERTION;  Surgeon: Algernon Huxley, MD;  Location: [facility];  Service: Cardiovascular;  Laterality: N/A;  . EVACUATION OF CERVICAL HEMATOMA N/A 06/14/2015   Procedure: EVACUATION OF CERVICAL HEMATOMA;  Surgeon: Newman Pies, MD;  Location: [facility];  Service: Neurosurgery;  Laterality: N/A;  . NECK SURGERY     x3  . TONSILLECTOMY      SOCIAL HISTORY:  Social History   Tobacco Use  . Smoking status: Former Smoker    Packs/day: 1.50    Years: 20.00  Pack years: 30.00    Types: Cigarettes    Start date: 03/19/1979    Quit date: 08/25/1999    Years since quitting: 19.0  . Smokeless tobacco: Never Used  . Tobacco comment: smoking cessation materials not required  Substance Use Topics  . Alcohol use: Not Currently    Alcohol/week: 0.0 standard drinks    FAMILY HISTORY:  Family History  Problem Relation Age of Onset  . Depression Mother   . Migraines Mother   . Dementia Father   . Diabetes Father   .  Hyperlipidemia Father   . Hyperlipidemia Brother   . Hyperlipidemia Brother   . Breast cancer Paternal Aunt        69s    DRUG ALLERGIES:  Allergies  Allergen Reactions  . Chlorhexidine Itching    Pt reports itching this AM post wipes w/ CHG, no redness or irritation assessed    REVIEW OF SYSTEMS:   CONSTITUTIONAL: No fever,+ fatigue, weakness.  EYES: No blurred or double vision.  EARS, NOSE, AND THROAT: No tinnitus or ear pain.  RESPIRATORY: No cough, +shortness of breath  CARDIOVASCULAR: No chest pain, +orthopnea, edema.  GASTROINTESTINAL: No nausea, vomiting, diarrhea or abdominal pain.  GENITOURINARY: No dysuria, hematuria.  ENDOCRINE: No polyuria, nocturia,  HEMATOLOGY: No anemia, easy bruising or bleeding SKIN: No rash or lesion. MUSCULOSKELETAL: No joint pain or arthritis.   NEUROLOGIC: No tingling, numbness, weakness.  PSYCHIATRY: No anxiety or depression.   MEDICATIONS AT HOME:  Prior to Admission medications   Medication Sig Start Date End Date Taking? Authorizing Provider  albuterol (PROVENTIL HFA;VENTOLIN HFA) 108 (90 Base) MCG/ACT inhaler Inhale 2 puffs into the lungs every 6 (six) hours as needed for wheezing or shortness of breath. 10/04/16   Hubbard Hartshorn, FNP  albuterol (PROVENTIL) (2.5 MG/3ML) 0.083% nebulizer solution Take 3 mLs (2.5 mg total) by nebulization every 6 (six) hours as needed for wheezing or shortness of breath. 08/18/18   Steele Sizer, MD  Ascorbic Acid (VITAMIN C PO) Take 1 tablet by mouth daily.     [provider]  budesonide-formoterol (SYMBICORT) 160-4.5 MCG/ACT inhaler Inhale 2 puffs into the lungs 2 (two) times daily.    [provider]  Cholecalciferol (VITAMIN D-1000 MAX ST) 1000 UNITS tablet Take 1 tablet by mouth 2 (two) times daily.    [provider]  clonazePAM (KLONOPIN) 1 MG tablet Take 1 tablet (1 mg total) by mouth 2 (two) times daily. Patient taking differently: Take 1 mg by mouth 2 (two) times  daily. And takes a 3rd one if needed for anxiety 08/18/18   Gladstone Lighter, MD  Cyanocobalamin (B-12 PO) Take 1 tablet by mouth daily.     [provider]  cyclobenzaprine (FLEXERIL) 10 MG tablet Take one tablet every 8 hours as needed 08/03/18   Lloyd Huger, MD  DULoxetine (CYMBALTA) 30 MG capsule Take 1 capsule (30 mg total) by mouth daily. 08/18/18   Gladstone Lighter, MD  ferrous sulfate (IRON SUPPLEMENT) 325 (65 FE) MG tablet Take 1 tablet by mouth daily.    [provider]  fluticasone (FLONASE) 50 MCG/ACT nasal spray Place 2 sprays into both nostrils as needed. 06/11/18 09/09/18  Steele Sizer, MD  furosemide (LASIX) 80 MG tablet Take 1 tablet (80 mg total) by mouth 2 (two) times daily. 08/28/18 11/26/18  End, Harrell Gave, MD  hydrALAZINE (APRESOLINE) 25 MG tablet Take 1 tablet (25 mg total) by mouth 3 (three) times daily. 08/18/18   Gladstone Lighter, MD  ipratropium (ATROVENT HFA) 17 MCG/ACT inhaler Inhale 2 puffs into the lungs every 6 (six) hours as needed.     [provider]  isosorbide mononitrate (IMDUR) 30 MG 24 hr tablet Take 1 tablet (30 mg total) by mouth daily. 08/18/18   Gladstone Lighter, MD  levothyroxine (SYNTHROID) 25 MCG tablet Take 1 tablet (25 mcg total) by mouth daily before breakfast. 07/21/18   Borders, Kirt Boys, NP  lidocaine (XYLOCAINE) 2 % solution Use as directed 15 mLs in the mouth or throat as needed for mouth pain. 11/28/17   Jacquelin Hawking, NP  Lysine 1000 MG TABS Take 1 tablet by mouth daily.     [provider]  Magnesium Oxide 400 (240 Mg) MG TABS Take 400 mg by mouth 2 (two) times daily.     [provider]  metoprolol succinate (TOPROL-XL) 50 MG 24 hr tablet Take 1 tablet (50 mg total) by mouth daily. Take with or immediately following a meal. 08/18/18   Gladstone Lighter, MD  Multiple Vitamins-Minerals (MULTIVITAMIN PO) Take 1 tablet by mouth daily.     [provider]  Omega-3 Fatty Acids (FISH OIL  PO) Take 1 capsule by mouth 2 (two) times daily.     [provider]  ondansetron (ZOFRAN-ODT) 8 MG disintegrating tablet Take 1 tablet (8 mg total) by mouth every 8 (eight) hours as needed for nausea or vomiting. 07/20/18   Borders, Kirt Boys, NP  oxyCODONE-acetaminophen (PERCOCET) 10-325 MG tablet Take 1 tablet by mouth every 8 (eight) hours as needed for pain. 08/18/18   Gladstone Lighter, MD  SUMAtriptan (IMITREX) 100 MG tablet May repeat in 2 hours if headache persists or recurs. 06/10/18   Steele Sizer, MD  prochlorperazine (COMPAZINE) 10 MG tablet Take 1 tablet (10 mg total) by mouth every 6 (six) hours as needed (Nausea or vomiting). 12/02/16 08/08/17  Lloyd Huger, MD      PHYSICAL EXAMINATION:   VITAL SIGNS: Blood pressure (!) 174/95, pulse 70, temperature 98.6 F (37 C), temperature source Oral, resp. rate (!) 22, height 5\' 2"  (1.575 m), weight 72.6 kg, SpO2 100 %.  GENERAL:  63 y.o.-year-old patient lying in the bed with no acute distress.  Overweight, nontoxic-appearing EYES: Pupils equal, round, reactive to light and accommodation. No scleral icterus. Extraocular muscles intact.  HEENT: Head atraumatic, normocephalic. Oropharynx and nasopharynx clear.  NECK:  Supple, no jugular venous distention. No thyroid enlargement, no tenderness.  LUNGS: Diminished breath sounds at bases bilaterally . No use of accessory muscles of respiration.  CARDIOVASCULAR: S1, S2 normal. No murmurs, rubs, or gallops.  ABDOMEN: Soft, nontender, nondistended. Bowel sounds present. No organomegaly or mass.  EXTREMITIES: Bilateral lower extremity edema, no cyanosis, or clubbing.  NEUROLOGIC: Cranial nerves II through XII are intact. Muscle strength 5/5 in all extremities. Sensation intact. Gait not checked.  PSYCHIATRIC: The patient is alert and oriented x 3.  SKIN: No obvious rash, lesion, or ulcer.   LABORATORY PANEL:   CBC Recent Labs  Lab 08/28/18 0943 08/28/18 1443  WBC 9.2 6.8   HGB 7.0* 6.9*  HCT 22.0* 21.7*  PLT 82* 72*  MCV 114.6* 112.4*  MCH 36.5* 35.8*  MCHC 31.8 31.8  RDW 20.2* 19.9*  LYMPHSABS  --  0.9  MONOABS  --  0.9  EOSABS  --  0.3  BASOSABS  --  0.0   ------------------------------------------------------------------------------------------------------------------  Chemistries  Recent Labs  Lab 08/28/18 0943 08/28/18 1443  NA 135 134*  K 4.8 4.3  CL 97* 95*  CO2 29 29  GLUCOSE 96 121*  BUN 31* 34*  CREATININE 4.89* 5.21*  CALCIUM 8.1* 7.9*  AST 29 37  ALT 17 17  ALKPHOS 60 61  BILITOT 0.9 0.8   ------------------------------------------------------------------------------------------------------------------ estimated creatinine clearance is 10.3 mL/min (A) (by C-G formula based on SCr of 5.21 mg/dL (H)). ------------------------------------------------------------------------------------------------------------------ No results for input(s): TSH, T4TOTAL, T3FREE, THYROIDAB in the last 72 hours.  Invalid input(s): FREET3   Coagulation profile Recent Labs  Lab 08/28/18 0943  INR 1.1   ------------------------------------------------------------------------------------------------------------------- No results for input(s): DDIMER in the last 72 hours. -------------------------------------------------------------------------------------------------------------------  Cardiac Enzymes Recent Labs  Lab 08/28/18 1443  TROPONINI 0.07*   ------------------------------------------------------------------------------------------------------------------ Invalid input(s): POCBNP  ---------------------------------------------------------------------------------------------------------------  Urinalysis    Component Value Date/Time   COLORURINE YELLOW (A) 08/08/2018 0212   APPEARANCEUR HAZY (A) 08/08/2018 0212   LABSPEC 1.012 08/08/2018 0212   PHURINE 5.0 08/08/2018 0212   GLUCOSEU NEGATIVE 08/08/2018 0212   HGBUR LARGE  (A) 08/08/2018 0212   BILIRUBINUR NEGATIVE 08/08/2018 0212   KETONESUR NEGATIVE 08/08/2018 0212   PROTEINUR 30 (A) 08/08/2018 0212   NITRITE NEGATIVE 08/08/2018 0212   LEUKOCYTESUR NEGATIVE 08/08/2018 7035     RADIOLOGY: Dg Chest 2 View  Result Date: 08/28/2018 CLINICAL DATA:  History of ovarian cancer.  Pleural effusions. EXAM: CHEST - 2 VIEW COMPARISON:  Single-view of the chest 08/15/2018 and 08/13/2018. CT chest 08/09/2018. FINDINGS: Port-A-Cath and dialysis catheter are again seen. Dialysis catheter tip is now positioned just within the right atrium. There are small bilateral pleural effusions are new since the most recent exam. Pulmonary vascular congestion and cardiomegaly noted. No acute bony abnormality. IMPRESSION: Small bilateral pleural effusions are new since the most recent examination. Mild cardiomegaly and vascular congestion. Electronically Signed   By: Inge Rise M.D.   On: 08/28/2018 14:44    EKG: Orders placed or performed during the hospital encounter of 08/28/18  . ED EKG  . ED EKG    IMPRESSION AND PLAN: *Acute on chronic diastolic CHF exacerbation Likely due to acute fluid overload state/end-stage renal disease Admit to telemetry bed on our congestive heart failure protocol, IV Lasix 3 times daily, strict I&O monitoring, daily weights, nephrology consulted for hemodialysis/Dr. Holley Raring, Toprol-XL Most recent echocardiogram noted for low normal ejection fraction  *Acute fluid overload state Likely secondary to end-stage renal disease Plan of care as stated above  *Chronic ESRD Plan of care as stated above  *Chronic mixed type asymptomatic anemia of disease/end-stage renal disease and iron deficiency Hemoglobin relatively stable Exacerbated by fluid overload state Continue iron supplementation, CBC daily, type and screen, transfuse as needed  *Chronic ovarian cancer Palliative chemotherapy started December 2019-as of today, patient not a candidate for  chemotherapy per oncology notes Oncology/Dr. Grayland Ormond consulted for continuity of care Palliative care consulted  *Chronic hypothyroidism, unspecified Continue Synthroid  DVT prophylaxis with heparin subcu Disposition pending clinical course  All the records are reviewed and case discussed with ED provider. Management plans discussed with the patient, family and they are in agreement.  CODE STATUS:full Code Status History    Date Active Date Inactive Code Status Order ID Comments User Context   08/06/2018 2355 08/18/2018 1650 Full Code [id number]  Mayer Camel, NP ED   Advance Care Planning Activity    Advance Directive Documentation     Most Recent Value  Type of Advance Directive  Healthcare Power of Attorney, Living will  Pre-existing out of facility DNR order (yellow form or pink  MOST form)  -  "MOST" Form in Place?  -       TOTAL TIME TAKING CARE OF THIS PATIENT: 40 minutes.    Avel Peace Isra Lindy M.D on 08/28/2018   Between 7am to 6pm - Pager - [phone]  After 6pm go to [email or web address] - password [email or web address] [facility]  Office  [phone]  CC: Primary care physician; Steele Sizer, MD   Note: This dictation was prepared with Dragon dictation along with smaller phrase technology. Any transcriptional errors that result from this process are unintentional.

## 2018-08-28 NOTE — Telephone Encounter (Signed)
I spoke with the patient regarding the results of her labs, indicating worsening anemia (hemoglobin 7) and BNP greater than 4500.  Based on echo findings, worsening clinical course, and severe pulmonary hypertension, I wonder if she could have a pulmonary embolism (active cancer and recent prolonged hospitalization) on top of her other issues such as severe anemia and HFpEF complicated by renal failure.  I advised the patient to go to the emergency room.  However, she is currently in the cancer center waiting to see Dr. Grayland Ormond.  I will reach out to him to discuss her case.  Nonetheless, I think that hospitalization may be unavoidable.  Nelva Bush, MD [facility] Pager: [phone]

## 2018-08-28 NOTE — ED Triage Notes (Signed)
Pt to ED brought from Cancer center c/o SOB, hypoxemia 89% had run out of oxygen, normally wears 2L [place].  Pt back to 100% 2L [place].  Has hx of stage 3 ovarian cancer, chemo since 2018.  Was at cancer center for checkup.  Also c/o new bilateral lower leg swelling.  Presents A&Ox4, chest rise even and unlabored, in NAD at this time.

## 2018-08-29 LAB — BASIC METABOLIC PANEL
Anion gap: 10 (ref 5–15)
BUN: 38 mg/dL — ABNORMAL HIGH (ref 8–23)
CO2: 28 mmol/L (ref 22–32)
Calcium: 8.4 mg/dL — ABNORMAL LOW (ref 8.9–10.3)
Chloride: 97 mmol/L — ABNORMAL LOW (ref 98–111)
Creatinine, Ser: 6.1 mg/dL — ABNORMAL HIGH (ref 0.44–1.00)
GFR calc Af Amer: 8 mL/min — ABNORMAL LOW (ref >60)
GFR calc non Af Amer: 7 mL/min — ABNORMAL LOW (ref >60)
Glucose, Bld: 89 mg/dL (ref 70–99)
Potassium: 5 mmol/L (ref 3.5–5.1)
Sodium: 135 mmol/L (ref 135–145)

## 2018-08-29 LAB — CBC
HCT: 23.9 % — ABNORMAL LOW (ref 36.0–46.0)
Hemoglobin: 7.5 g/dL — ABNORMAL LOW (ref 12.0–15.0)
MCH: 37.5 pg — ABNORMAL HIGH (ref 26.0–34.0)
MCHC: 31.4 g/dL (ref 30.0–36.0)
MCV: 119.5 fL — ABNORMAL HIGH (ref 80.0–100.0)
Platelets: 80 10*3/uL — ABNORMAL LOW (ref 150–400)
RBC: 2 MIL/uL — ABNORMAL LOW (ref 3.87–5.11)
RDW: 19.5 % — ABNORMAL HIGH (ref 11.5–15.5)
WBC: 8.1 10*3/uL (ref 4.0–10.5)
nRBC: 0.4 % — ABNORMAL HIGH (ref 0.0–0.2)

## 2018-08-29 MED ORDER — LIDOCAINE-PRILOCAINE 2.5-2.5 % EX CREA
1.0000 "application " | TOPICAL_CREAM | CUTANEOUS | Status: DC | PRN
  Filled 2018-08-29: qty 5

## 2018-08-29 MED ORDER — SODIUM CHLORIDE 0.9 % IV SOLN: 100.0000 mL | INTRAVENOUS | Status: DC | PRN

## 2018-08-29 MED ORDER — PENTAFLUOROPROP-TETRAFLUOROETH EX AERO
1.0000 "application " | INHALATION_SPRAY | CUTANEOUS | Status: DC | PRN
  Filled 2018-08-29: qty 30

## 2018-08-29 MED ORDER — LIDOCAINE HCL (PF) 1 % IJ SOLN
5.0000 mL | INTRAMUSCULAR | Status: DC | PRN
  Filled 2018-08-29: qty 5

## 2018-08-29 MED ORDER — HEPARIN SODIUM (PORCINE) 1000 UNIT/ML DIALYSIS
1000.0000 [IU] | INTRAMUSCULAR | Status: DC | PRN
  Filled 2018-08-29: qty 1

## 2018-08-29 MED ORDER — EPOETIN ALFA 10000 UNIT/ML IJ SOLN: 4000.0000 [IU] | INTRAMUSCULAR | Status: DC

## 2018-08-29 MED ORDER — ALTEPLASE 2 MG IJ SOLR
2.0000 mg | Freq: Once | INTRAMUSCULAR | Status: DC | PRN
  Filled 2018-08-29: qty 2

## 2018-08-29 NOTE — Progress Notes (Signed)
[facility] NAME: Ermalinda Joubert    MR#:  [id number]  DATE OF BIRTH:  10-15-1955  SUBJECTIVE:  CHIEF COMPLAINT:   Chief Complaint  Patient presents with  . Shortness of Breath  Patient seen and evaluated today Decreased shortness of breath No cough No fever no chest pain  REVIEW OF SYSTEMS:    ROS  CONSTITUTIONAL: No documented fever. Has fatigue, weakness. No weight gain, no weight loss.  EYES: No blurry or double vision.  ENT: No tinnitus. No postnasal drip. No redness of the oropharynx.  RESPIRATORY: No cough, no wheeze, no hemoptysis. mild dyspnea.  CARDIOVASCULAR: No chest pain. No orthopnea. No palpitations. No syncope.  GASTROINTESTINAL: No nausea, no vomiting or diarrhea. No abdominal pain. No melena or hematochezia.  GENITOURINARY: No dysuria or hematuria.  ENDOCRINE: No polyuria or nocturia. No heat or cold intolerance.  HEMATOLOGY: No anemia. No bruising. No bleeding.  INTEGUMENTARY: No rashes. No lesions.  MUSCULOSKELETAL: No arthritis. No swelling. No gout.  NEUROLOGIC: No numbness, tingling, or ataxia. No seizure-type activity.  PSYCHIATRIC: No anxiety. No insomnia. No ADD.   DRUG ALLERGIES:   Allergies  Allergen Reactions  . Chlorhexidine Itching    Pt reports itching this AM post wipes w/ CHG, no redness or irritation assessed    VITALS:  Blood pressure (!) 184/99, pulse 76, temperature 98.7 F (37.1 C), temperature source Oral, resp. rate 16, height 5\' 2"  (1.575 m), weight 76.6 kg, SpO2 97 %.  PHYSICAL EXAMINATION:   Physical Exam  GENERAL:  63 y.o.-year-old patient lying in the bed with no acute distress.  EYES: Pupils equal, round, reactive to light and accommodation. No scleral icterus. Extraocular muscles intact.  HEENT: Head atraumatic, normocephalic. Oropharynx and nasopharynx clear.  NECK:  Supple, no jugular venous distention. No thyroid enlargement, no tenderness.  LUNGS: Improved  breath sounds bilaterally, basal crepitations heard. No use of accessory muscles of respiration.  CARDIOVASCULAR: S1, S2 normal. No murmurs, rubs, or gallops.  ABDOMEN: Soft, nontender, nondistended. Bowel sounds present. No organomegaly or mass.  EXTREMITIES: No cyanosis, clubbing  has edema b/l.    NEUROLOGIC: Cranial nerves II through XII are intact. No focal Motor or sensory deficits b/l.   PSYCHIATRIC: The patient is alert and oriented x 3.  SKIN: No obvious rash, lesion, or ulcer.   LABORATORY PANEL:   CBC Recent Labs  Lab 08/29/18 0442  WBC 8.1  HGB 7.5*  HCT 23.9*  PLT 80*   ------------------------------------------------------------------------------------------------------------------ Chemistries  Recent Labs  Lab 08/28/18 1443 08/29/18 0442  NA 134* 135  K 4.3 5.0  CL 95* 97*  CO2 29 28  GLUCOSE 121* 89  BUN 34* 38*  CREATININE 5.21* 6.10*  CALCIUM 7.9* 8.4*  AST 37  --   ALT 17  --   ALKPHOS 61  --   BILITOT 0.8  --    ------------------------------------------------------------------------------------------------------------------  Cardiac Enzymes Recent Labs  Lab 08/28/18 1443  TROPONINI 0.07*   ------------------------------------------------------------------------------------------------------------------  RADIOLOGY:  Dg Chest 2 View  Result Date: 08/28/2018 CLINICAL DATA:  History of ovarian cancer.  Pleural effusions. EXAM: CHEST - 2 VIEW COMPARISON:  Single-view of the chest 08/15/2018 and 08/13/2018. CT chest 08/09/2018. FINDINGS: Port-A-Cath and dialysis catheter are again seen. Dialysis catheter tip is now positioned just within the right atrium. There are small bilateral pleural effusions are new since the most recent exam. Pulmonary vascular congestion and cardiomegaly noted. No acute bony abnormality. IMPRESSION: Small bilateral pleural effusions  are new since the most recent examination. Mild cardiomegaly and vascular congestion.  Electronically Signed   By: Inge Rise M.D.   On: 08/28/2018 14:44     ASSESSMENT AND PLAN:   63 year old female patient with stage III high-grade serous ovarian carcinoma, end-stage renal disease on dialysis, chronic anemia, chronic thrombocytopenia currently under hospitalist service for shortness of breath  -Acute on chronic diastolic heart failure exacerbation Improving slowly IV Lasix for diuresis Dialysis to remove excess fluid Nephrology follow-up Recent echocardiogram reviewed Daily body weights and input output chart  -ESRD Nephrology follow-up for dialysis  -Ovarian cancer stage III high-grade serous Appreciate oncology follow-up No active treatments recommended Long-term prognosis poor  -Chronic anemia Monitor hemoglobin hematocrit PRBC transfusion if hemoglobin less than 7  -DVT prophylaxis subcu heparin Monitor platelet counts  All the records are reviewed and case discussed with Care Management/Social Worker. Management plans discussed with the patient, family and they are in agreement.  CODE STATUS: Full code  DVT Prophylaxis: SCDs  TOTAL TIME TAKING CARE OF THIS PATIENT: 37 minutes.   POSSIBLE D/C IN 2 to 3 DAYS, DEPENDING ON CLINICAL CONDITION.  Saundra Shelling M.D on 08/29/2018 at 10:41 AM  Between 7am to 6pm - Pager - [phone]  After 6pm go to [email or web address] - password [email or web address] [place] [facility]  Office  [phone]  CC: Primary care physician; Steele Sizer, MD  Note: This dictation was prepared with Dragon dictation along with smaller phrase technology. Any transcriptional errors that result from this process are unintentional.

## 2018-08-29 NOTE — Progress Notes (Signed)
HD START   08/29/18 1500  Vital Signs  Temp 98.4 F (36.9 C)  Temp Source Oral  Pulse Rate 80  Pulse Rate Source Monitor  Resp (!) 22  BP (!) 182/102  BP Location Left Arm  BP Method Automatic  Patient Position (if appropriate) Lying  Oxygen Therapy  SpO2 96 %  O2 Device Nasal Cannula  O2 Flow Rate (L/min) 3 L/min  During Hemodialysis Assessment  Blood Flow Rate (mL/min) 400 mL/min  Arterial Pressure (mmHg) -190 mmHg  Venous Pressure (mmHg) 160 mmHg  Transmembrane Pressure (mmHg) 60 mmHg  Ultrafiltration Rate (mL/min) 640 mL/min  Dialysate Flow Rate (mL/min) 800 ml/min  Conductivity: Machine  13.8  HD Safety Checks Performed Yes  Dialysis Fluid Bolus Normal Saline  Bolus Amount (mL) 250 mL  Intra-Hemodialysis Comments Tx initiated ([facility] CARE PER POLICY)

## 2018-08-29 NOTE — Consult Note (Signed)
Patient seen and evaluated in clinic yesterday afternoon.  1. Stage IIIc high-grade serous ovarian carcinoma:  Patient last received treatment with single agent gemcitabine on Jul 27, 2018.  Her last CA-125 on Jul 27, 2018 was reported 75.4.  Given patient's renal failure and declining performance status no further treatments are planned at this time.  Repeat CA-125 this hospital admission. 2.  Shortness of breath: Likely multifactorial, but appears to be mostly related to fluid overload.  Nephrology consult pending for dialysis.  Appreciate cardiology input.  It appears she has pulmonary hypertension.  BNP is greater than 4500. Consider PE protocol CT scan to assess for possible clot. 3.  Acute renal failure: Continue dialysis as scheduled.    Nephrology consult as above. 4.  Pain: Patient does not complain of this today.  Continue current narcotic regimen. 5.  Anemia: Patient's hemoglobin is 7.5 today.  Continue to monitor closely.  If patient requires a transfusion, this will have to coordinated with dialysis given her problems with fluid overload. 6.  Thrombocytopenia: Chronic and unchanged, monitor.  Appreciate consult, will follow.

## 2018-08-29 NOTE — Progress Notes (Signed)
[facility]  ROUNDING NOTE   Subjective:  Patient seen at bedside. Well-known to [place] from prior admission. Came in with increasing shortness of breath, weight gain, and volume overload. She has been under our care for management of acute renal failure however it appears that the patient may actually be end-stage renal disease.   Objective:  Vital signs in last 24 hours:  Temp:  [98.3 F (36.8 C)-98.7 F (37.1 C)] 98.3 F (36.8 C) (06/13 1414) Pulse Rate:  [70-82] 82 (06/13 1414) Resp:  [15-22] 20 (06/13 1414) BP: (164-184)/(89-109) 172/109 (06/13 1414) SpO2:  [95 %-100 %] 95 % (06/13 1414) Weight:  [75.4 kg-76.6 kg] 76.6 kg (06/13 0500)  Weight change:  Filed Weights   08/28/18 1435 08/28/18 1952 08/29/18 0500  Weight: 72.6 kg 75.4 kg 76.6 kg    Intake/Output: No intake/output data recorded.   Intake/Output this shift:  Total I/O In: 120 [P.O.:120] Out: -   Physical Exam: General: Slightly increased work of breathing  Head: Normocephalic, atraumatic. Moist oral mucosal membranes  Eyes: Anicteric  Neck: Supple, trachea midline  Lungs:  Basilar rales, slightly increased work of breathing.  Heart: Regular rate and rhythm  Abdomen:  Soft, nontender,   Extremities: 2+ LE edema  Neurologic: Nonfocal, moving all four extremities  Skin: No lesions  Access: Left internal jugular PermCath    Basic Metabolic Panel: Recent Labs  Lab 08/28/18 0943 08/28/18 1443 08/29/18 0442  NA 135 134* 135  K 4.8 4.3 5.0  CL 97* 95* 97*  CO2 29 29 28   GLUCOSE 96 121* 89  BUN 31* 34* 38*  CREATININE 4.89* 5.21* 6.10*  CALCIUM 8.1* 7.9* 8.4*    Liver Function Tests: Recent Labs  Lab 08/28/18 0943 08/28/18 1443  AST 29 37  ALT 17 17  ALKPHOS 60 61  BILITOT 0.9 0.8  PROT 5.4* 5.5*  ALBUMIN 2.9* 3.0*   No results for input(s): LIPASE, AMYLASE in the last 168 hours. No results for input(s): AMMONIA in the last 168 hours.  CBC: Recent Labs  Lab  08/28/18 0943 08/28/18 1443 08/29/18 0442  WBC 9.2 6.8 8.1  NEUTROABS  --  4.7  --   HGB 7.0* 6.9* 7.5*  HCT 22.0* 21.7* 23.9*  MCV 114.6* 112.4* 119.5*  PLT 82* 72* 80*    Cardiac Enzymes: Recent Labs  Lab 08/28/18 1443  TROPONINI 0.07*    BNP: Invalid input(s): POCBNP  CBG: No results for input(s): GLUCAP in the last 168 hours.  Microbiology: Results for orders placed or performed during the hospital encounter of 08/28/18  SARS Coronavirus 2 (CEPHEID - Performed in [place] hospital lab), Hosp Order     Status: None   Collection Time: 08/28/18  4:00 PM   Specimen: Nasopharyngeal Swab  Result Value Ref Range Status   SARS Coronavirus 2 NEGATIVE NEGATIVE Final    Comment: (NOTE) If result is NEGATIVE SARS-CoV-2 target nucleic acids are NOT DETECTED. The SARS-CoV-2 RNA is generally detectable in upper and lower  respiratory specimens during the acute phase of infection. The lowest  concentration of SARS-CoV-2 viral copies this assay can detect is 250  copies / mL. A negative result does not preclude SARS-CoV-2 infection  and should not be used as the sole basis for treatment or other  patient management decisions.  A negative result may occur with  improper specimen collection / handling, submission of specimen other  than nasopharyngeal swab, presence of viral mutation(s) within the  areas targeted by this  assay, and inadequate number of viral copies  (<250 copies / mL). A negative result must be combined with clinical  observations, patient history, and epidemiological information. If result is POSITIVE SARS-CoV-2 target nucleic acids are DETECTED. The SARS-CoV-2 RNA is generally detectable in upper and lower  respiratory specimens dur ing the acute phase of infection.  Positive  results are indicative of active infection with SARS-CoV-2.  Clinical  correlation with patient history and other diagnostic information is  necessary to determine patient infection  status.  Positive results do  not rule out bacterial infection or co-infection with other viruses. If result is PRESUMPTIVE POSTIVE SARS-CoV-2 nucleic acids MAY BE PRESENT.   A presumptive positive result was obtained on the submitted specimen  and confirmed on repeat testing.  While 2019 novel coronavirus  (SARS-CoV-2) nucleic acids may be present in the submitted sample  additional confirmatory testing may be necessary for epidemiological  and / or clinical management purposes  to differentiate between  SARS-CoV-2 and other Sarbecovirus currently known to infect humans.  If clinically indicated additional testing with an alternate test  methodology [phone]) is advised. The SARS-CoV-2 RNA is generally  detectable in upper and lower respiratory sp ecimens during the acute  phase of infection. The expected result is Negative. Fact Sheet for Patients:  StrictlyIdeas.no Fact Sheet for Healthcare Providers: BankingDealers.co.za This test is not yet approved or cleared by the [place] FDA and has been authorized for detection and/or diagnosis of SARS-CoV-2 by FDA under an Emergency Use Authorization (EUA).  This EUA will remain in effect (meaning this test can be used) for the duration of the COVID-19 declaration under Section 564(b)(1) of the Act, 21 U.S.C. section 360bbb-3(b)(1), unless the authorization is terminated or revoked sooner. Performed at [facility], [address] [place], [place] [address]     Coagulation Studies: Recent Labs    08/28/18 0943  LABPROT 13.7  INR 1.1    Urinalysis: No results for input(s): COLORURINE, LABSPEC, PHURINE, GLUCOSEU, HGBUR, BILIRUBINUR, KETONESUR, PROTEINUR, UROBILINOGEN, NITRITE, LEUKOCYTESUR in the last 72 hours.  Invalid input(s): APPERANCEUR    Imaging: Dg Chest 2 View  Result Date: 08/28/2018 CLINICAL DATA:  History of ovarian cancer.  Pleural effusions. EXAM:  CHEST - 2 VIEW COMPARISON:  Single-view of the chest 08/15/2018 and 08/13/2018. CT chest 08/09/2018. FINDINGS: Port-A-Cath and dialysis catheter are again seen. Dialysis catheter tip is now positioned just within the right atrium. There are small bilateral pleural effusions are new since the most recent exam. Pulmonary vascular congestion and cardiomegaly noted. No acute bony abnormality. IMPRESSION: Small bilateral pleural effusions are new since the most recent examination. Mild cardiomegaly and vascular congestion. Electronically Signed   By: Inge Rise M.D.   On: 08/28/2018 14:44     Medications:   . sodium chloride    . sodium chloride    . sodium chloride     . cholecalciferol  1,000 Units Oral BID  . clonazePAM  1 mg Oral BID  . DULoxetine  30 mg Oral Daily  . ferrous sulfate  325 mg Oral Daily  . furosemide  80 mg Intravenous TID  . heparin  5,000 Units Subcutaneous Q8H  . hydrALAZINE  25 mg Oral TID  . ipratropium  2.5 mL Inhalation QID  . isosorbide mononitrate  30 mg Oral Daily  . levothyroxine  25 mcg Oral Q0600  . magnesium oxide  400 mg Oral BID  . metoprolol succinate  50 mg Oral Daily  . mometasone-formoterol  2 puff Inhalation BID  . multivitamin with minerals  1 tablet Oral Daily  . omega-3 acid ethyl esters  2 g Oral Daily  . sodium chloride flush  3 mL Intravenous Q12H  . vitamin B-12  100 mcg Oral Daily  . vitamin C  250 mg Oral Daily   sodium chloride, sodium chloride, sodium chloride, acetaminophen, albuterol, ALPRAZolam, alteplase, clonazePAM, cyclobenzaprine, fluticasone, heparin, iohexol, lidocaine (PF), lidocaine, lidocaine-prilocaine, ondansetron (ZOFRAN) IV, ondansetron, oxyCODONE-acetaminophen **AND** oxyCODONE, pentafluoroprop-tetrafluoroeth, sodium chloride flush, SUMAtriptan  Assessment/ Plan:  Ms. Jessica Burgess is a 63 y.o. white female with history ofovarian cancer stage III,GERD, migraine headaches, cervical spinal stenosis,who was  admitted to [facility] on5/21/2020for evaluation of significant shortness of breath.   1. Acute renal failure on chronic kidney disease stage III with proteinuria baseline creatinine 1.7, GFR of 30 on 07/27/18.   Chronic kidney disease cause seems unclear.  Acute renal failure is thought to be secondary to acute cardiorenal syndrome.  Serologic work up with ANA positive but reflex antibodies nonreactive.  First treatment on 5/25.  -The patient continues to have significant acute renal failure.  Suspect that she may actually have end-stage renal disease.  We will need to intensify volume removal.  Previously we were trying to avoid this given acute kidney injury but she is clearly volume overloaded now.  Patient due for dialysis today.  Orders have been prepared.  2. Hypertension: Blood pressure elevated.  Continue metoprolol as well as hydralazine.  Consider adding amlodipine as well if blood pressure remains elevated.  3. Anemia with acute renal failure: Hemoglobin 7.5.  Oncology has okayed the use of Epogen.  Continue to monitor hemoglobin.  Start the patient on Epogen 4000 units IV with dialysis.  4.  Pulmonary edema/shortness of breath.  As above we do need to intensify ultrafiltration to improve her respiratory status.   LOS: 1 Itzae Mccurdy 6/13/20203:08 PM

## 2018-08-30 LAB — BASIC METABOLIC PANEL
Anion gap: 10 (ref 5–15)
BUN: 21 mg/dL (ref 8–23)
CO2: 32 mmol/L (ref 22–32)
Calcium: 8.2 mg/dL — ABNORMAL LOW (ref 8.9–10.3)
Chloride: 96 mmol/L — ABNORMAL LOW (ref 98–111)
Creatinine, Ser: 3.99 mg/dL — ABNORMAL HIGH (ref 0.44–1.00)
GFR calc Af Amer: 13 mL/min — ABNORMAL LOW (ref >60)
GFR calc non Af Amer: 11 mL/min — ABNORMAL LOW (ref >60)
Glucose, Bld: 102 mg/dL — ABNORMAL HIGH (ref 70–99)
Potassium: 4.1 mmol/L (ref 3.5–5.1)
Sodium: 138 mmol/L (ref 135–145)

## 2018-08-30 LAB — CBC
HCT: 22.5 % — ABNORMAL LOW (ref 36.0–46.0)
Hemoglobin: 7.1 g/dL — ABNORMAL LOW (ref 12.0–15.0)
MCH: 35.5 pg — ABNORMAL HIGH (ref 26.0–34.0)
MCHC: 31.6 g/dL (ref 30.0–36.0)
MCV: 112.5 fL — ABNORMAL HIGH (ref 80.0–100.0)
Platelets: 74 10*3/uL — ABNORMAL LOW (ref 150–400)
RBC: 2 MIL/uL — ABNORMAL LOW (ref 3.87–5.11)
RDW: 18.6 % — ABNORMAL HIGH (ref 11.5–15.5)
WBC: 4.7 10*3/uL (ref 4.0–10.5)
nRBC: 0 % (ref 0.0–0.2)

## 2018-08-30 MED ORDER — NEPRO/CARBSTEADY PO LIQD
237.0000 mL | Freq: Two times a day (BID) | ORAL | Status: DC
  Administered 2018-08-30: 10:00:00 237 mL via ORAL

## 2018-08-30 MED ORDER — RENA-VITE PO TABS
1.0000 | ORAL_TABLET | Freq: Every day | ORAL | Status: DC
  Filled 2018-08-30: qty 1

## 2018-08-30 NOTE — Progress Notes (Signed)
[facility]  ROUNDING NOTE   Subjective:  Late entry. Patient seen prior to discharge. Feeling much better after consecutive days of dialysis. Updated patient's sister regarding her overall renal status now.   Objective:  Vital signs in last 24 hours:  Temp:  [97.8 F (36.6 C)-98.6 F (37 C)] 97.8 F (36.6 C) (06/14 0515) Pulse Rate:  [72-80] 72 (06/14 0515) Resp:  [14-26] 18 (06/14 0515) BP: (125-187)/(74-102) 125/100 (06/14 0515) SpO2:  [93 %-100 %] 97 % (06/14 1229) Weight:  [73.4 kg-76.6 kg] 75.2 kg (06/14 0500)  Weight change: 4.025 kg Filed Weights   08/29/18 1445 08/29/18 1838 08/30/18 0500  Weight: 76.6 kg 73.4 kg 75.2 kg    Intake/Output: I/O last 3 completed shifts: In: 120 [P.O.:120] Out: 2000 [Other:2000]   Intake/Output this shift:  No intake/output data recorded.  Physical Exam: General: No acute distress  Head: Normocephalic, atraumatic. Moist oral mucosal membranes  Eyes: Anicteric  Neck: Supple, trachea midline  Lungs:  CTAB, normal effort  Heart: Regular rate and rhythm  Abdomen:  Soft, nontender,   Extremities: 1+ LE edema  Neurologic: Nonfocal, moving all four extremities  Skin: No lesions  Access: Left internal jugular PermCath    Basic Metabolic Panel: Recent Labs  Lab 08/28/18 0943 08/28/18 1443 08/29/18 0442 08/30/18 0431  NA 135 134* 135 138  K 4.8 4.3 5.0 4.1  CL 97* 95* 97* 96*  CO2 29 29 28  32  GLUCOSE 96 121* 89 102*  BUN 31* 34* 38* 21  CREATININE 4.89* 5.21* 6.10* 3.99*  CALCIUM 8.1* 7.9* 8.4* 8.2*    Liver Function Tests: Recent Labs  Lab 08/28/18 0943 08/28/18 1443  AST 29 37  ALT 17 17  ALKPHOS 60 61  BILITOT 0.9 0.8  PROT 5.4* 5.5*  ALBUMIN 2.9* 3.0*   No results for input(s): LIPASE, AMYLASE in the last 168 hours. No results for input(s): AMMONIA in the last 168 hours.  CBC: Recent Labs  Lab 08/28/18 0943 08/28/18 1443 08/29/18 0442 08/30/18 0431  WBC 9.2 6.8 8.1 4.7  NEUTROABS   --  4.7  --   --   HGB 7.0* 6.9* 7.5* 7.1*  HCT 22.0* 21.7* 23.9* 22.5*  MCV 114.6* 112.4* 119.5* 112.5*  PLT 82* 72* 80* 74*    Cardiac Enzymes: Recent Labs  Lab 08/28/18 1443  TROPONINI 0.07*    BNP: Invalid input(s): POCBNP  CBG: No results for input(s): GLUCAP in the last 168 hours.  Microbiology: Results for orders placed or performed during the hospital encounter of 08/28/18  SARS Coronavirus 2 (CEPHEID - Performed in [place] hospital lab), Hosp Order     Status: None   Collection Time: 08/28/18  4:00 PM   Specimen: Nasopharyngeal Swab  Result Value Ref Range Status   SARS Coronavirus 2 NEGATIVE NEGATIVE Final    Comment: (NOTE) If result is NEGATIVE SARS-CoV-2 target nucleic acids are NOT DETECTED. The SARS-CoV-2 RNA is generally detectable in upper and lower  respiratory specimens during the acute phase of infection. The lowest  concentration of SARS-CoV-2 viral copies this assay can detect is 250  copies / mL. A negative result does not preclude SARS-CoV-2 infection  and should not be used as the sole basis for treatment or other  patient management decisions.  A negative result may occur with  improper specimen collection / handling, submission of specimen other  than nasopharyngeal swab, presence of viral mutation(s) within the  areas targeted by this assay, and inadequate number  of viral copies  (<250 copies / mL). A negative result must be combined with clinical  observations, patient history, and epidemiological information. If result is POSITIVE SARS-CoV-2 target nucleic acids are DETECTED. The SARS-CoV-2 RNA is generally detectable in upper and lower  respiratory specimens dur ing the acute phase of infection.  Positive  results are indicative of active infection with SARS-CoV-2.  Clinical  correlation with patient history and other diagnostic information is  necessary to determine patient infection status.  Positive results do  not rule out  bacterial infection or co-infection with other viruses. If result is PRESUMPTIVE POSTIVE SARS-CoV-2 nucleic acids MAY BE PRESENT.   A presumptive positive result was obtained on the submitted specimen  and confirmed on repeat testing.  While 2019 novel coronavirus  (SARS-CoV-2) nucleic acids may be present in the submitted sample  additional confirmatory testing may be necessary for epidemiological  and / or clinical management purposes  to differentiate between  SARS-CoV-2 and other Sarbecovirus currently known to infect humans.  If clinically indicated additional testing with an alternate test  methodology [id number]) is advised. The SARS-CoV-2 RNA is generally  detectable in upper and lower respiratory sp ecimens during the acute  phase of infection. The expected result is Negative. Fact Sheet for Patients:  StrictlyIdeas.no Fact Sheet for Healthcare Providers: BankingDealers.co.za This test is not yet approved or cleared by the [place] FDA and has been authorized for detection and/or diagnosis of SARS-CoV-2 by FDA under an Emergency Use Authorization (EUA).  This EUA will remain in effect (meaning this test can be used) for the duration of the COVID-19 declaration under Section 564(b)(1) of the Act, 21 U.S.C. section 360bbb-3(b)(1), unless the authorization is terminated or revoked sooner. Performed at [facility], [address] [place], [place] [address]     Coagulation Studies: Recent Labs    08/28/18 0943  LABPROT 13.7  INR 1.1    Urinalysis: No results for input(s): COLORURINE, LABSPEC, PHURINE, GLUCOSEU, HGBUR, BILIRUBINUR, KETONESUR, PROTEINUR, UROBILINOGEN, NITRITE, LEUKOCYTESUR in the last 72 hours.  Invalid input(s): APPERANCEUR    Imaging: No results found.   Medications:   . sodium chloride     . cholecalciferol  1,000 Units Oral BID  . clonazePAM  1 mg Oral BID  . DULoxetine  30 mg  Oral Daily  . [START ON 09/01/2018] epoetin (EPOGEN/PROCRIT) injection  4,000 Units Intravenous Q T,Th,Sa-HD  . feeding supplement (NEPRO CARB STEADY)  237 mL Oral BID BM  . ferrous sulfate  325 mg Oral Daily  . furosemide  80 mg Intravenous TID  . heparin  5,000 Units Subcutaneous Q8H  . hydrALAZINE  25 mg Oral TID  . ipratropium  2.5 mL Inhalation QID  . isosorbide mononitrate  30 mg Oral Daily  . levothyroxine  25 mcg Oral Q0600  . magnesium oxide  400 mg Oral BID  . metoprolol succinate  50 mg Oral Daily  . mometasone-formoterol  2 puff Inhalation BID  . multivitamin  1 tablet Oral QHS  . omega-3 acid ethyl esters  2 g Oral Daily  . sodium chloride flush  3 mL Intravenous Q12H  . vitamin B-12  100 mcg Oral Daily  . vitamin C  250 mg Oral Daily   sodium chloride, acetaminophen, albuterol, ALPRAZolam, clonazePAM, cyclobenzaprine, fluticasone, iohexol, lidocaine, ondansetron (ZOFRAN) IV, ondansetron, oxyCODONE-acetaminophen **AND** oxyCODONE, sodium chloride flush, SUMAtriptan  Assessment/ Plan:  Jessica Burgess is a 63 y.o. white female with history ofovarian cancer stage III,GERD, migraine  headaches, cervical spinal stenosis,who was admitted to [facility] on5/21/2020for evaluation of significant shortness of breath.   1. Acute renal failure on chronic kidney disease stage III with proteinuria baseline creatinine 1.7, GFR of 30 on 07/27/18.   Chronic kidney disease cause seems unclear.  Acute renal failure is thought to be secondary to acute cardiorenal syndrome.  Serologic work up with ANA positive but reflex antibodies nonreactive.  First treatment on 5/25.  -Patient has improved significantly with consecutive days of dialysis.  We will plan for next dialysis treatment on Tuesday as an outpatient.  2. Hypertension: Continue to monitor blood pressure as an outpatient.  Continue metoprolol and hydralazine.  Consider adding amlodipine as an outpatient.  3. Anemia with acute  renal failure: Hemoglobin down a bit to 7.1.  Use of Epogen has been okayed by oncology.  4.  Pulmonary edema/shortness of breath.  Significantly improved with consecutive days of dialysis.  Continue to monitor respiratory status as an outpatient.   LOS: 2 Brandon Wiechman 6/14/20202:35 PM

## 2018-08-30 NOTE — TOC Transition Note (Signed)
Transition of Care [facility]) - CM/SW Discharge Note   Patient Details  Name: Jessica Burgess MRN: [id number] Date of Birth: 14-Aug-1955  Transition of Care [facility]) CM/SW Contact:  Latanya Maudlin, RN Phone Number: 08/30/2018, 10:25 AM   Clinical Narrative:  [facility] team met with patient to complete high risk re admission reassessment. Patient was recently discharged as a new HD patient. Patient receiving treatments at [facility] on [address]. Patients roommate/friend has been providing transport. Patient reports she has lived with this roommate for 25 years and although she has siblings her roommate is her support person. Patient is quite groggy at time of assessment and not providing clear answers. She could not tell me her roomates name. Patient continues to refuse home health as she did on the previous admission. Patient also reports she has a rolling walker, bedside commode and that she is on chronic oxygen for her panic attacks which have worsened. There is previous documentation that patient had no DME. Voicemail left for patients sister to assist with assessment. Current with Hf clinic. PCP is Steele Sizer      Barriers to Discharge: Continued Medical Work up   Patient Goals and CMS Choice   CMS Medicare.gov Compare Post Acute Care list provided to:: Patient Choice offered to / list presented to : Patient  Discharge Placement                       Discharge Plan and Services   Discharge Planning Services: CM Consult Post Acute Care Choice: Home Health                    HH Arranged: Patient Refused [facility]          Social Determinants of Health (SDOH) Interventions     Readmission Risk Interventions Readmission Risk Prevention Plan 08/30/2018 08/18/2018 08/10/2018  Transportation Screening Complete Complete Complete  HRI or Home Care Consult - - Complete  Medication Review Press photographer) Complete Complete -  PCP or Specialist appointment within 3-5 days of discharge - Not  Complete -  [place] or [place] Complete Patient refused -  SW Recovery Care/Counseling Consult - Not Complete -  SW Consult Not Complete Comments - not indicated -  Palliative Care Screening - Not Applicable -  [place] - Not Applicable -  Some recent data might be hidden

## 2018-08-30 NOTE — Discharge Summary (Signed)
[facility] NAME: Remedy Corporan    MR#:  [id number]  DATE OF BIRTH:  1955-06-14  DATE OF ADMISSION:  08/28/2018 ADMITTING PHYSICIAN: Avel Peace Salary, MD  DATE OF DISCHARGE: 08/30/2018  PRIMARY CARE PHYSICIAN: Steele Sizer, MD   ADMISSION DIAGNOSIS:  Congestive heart failure, unspecified HF chronicity, unspecified heart failure type ([place]) [I50.9]  DISCHARGE DIAGNOSIS:  Acute on chronic diastolic heart failure exacerbation Fluid overload End-stage renal disease on dialysis Anemia of chronic disease Ovarian cancer  SECONDARY DIAGNOSIS:   Past Medical History:  Diagnosis Date  . Allergic rhinitis, cause unspecified   . Anxiety state, unspecified   . Arthritis   . Asthma    only when sick   . Backache, unspecified   . Bronchitis    hx of when get sick  . Cancer ([place])    skin cancer , basal cell   . Cancer ([place]) 11/2016   ovarian  . Cervicalgia   . Complication of anesthesia   . Dermatophytosis of nail   . Dysmetabolic syndrome X   . Encounter for long-term (current) use of other medications   . Esophageal reflux   . Insomnia, unspecified   . Leukocytosis, unspecified   . Migraine without aura, without mention of intractable migraine without mention of status migrainosus   . Other and unspecified hyperlipidemia   . Other malaise and fatigue   . Overweight(278.02)   . Personal history of chemotherapy now   ovarian  . PONV (postoperative nausea and vomiting)   . Renal insufficiency   . Spinal stenosis in cervical region   . Symptomatic menopausal or female climacteric states   . Unspecified disorder of skin and subcutaneous tissue   . Unspecified vitamin D deficiency      ADMITTING HISTORY  Tirza Senteno  is a 63 y.o. female with a known history per below which includes end-stage renal disease on hemodialysis, ovarian cancer-started on palliative chemo in December 2019-managed by Dr. Grayland Ormond, presents to ER  at the request of cardiology/ Dr End for concern for acute fluid overload, worsening lower extremity swelling, orthopnea, in the emergency room patient was found to have BNP greater than 4500, hemoglobin 6.9-stable compared to previous, creatinine 5.2, sodium 134, chest x-ray noted for mild congestion, hospitalist asked to admit, patient evaluated in the emergency room, no apparent distress, resting comfortably in bed, patient now been admitted for acute on chronic diastolic congestive heart failure most likely secondary to acute fluid overload state from end-stage renal disease.  HOSPITAL COURSE:  Patient was admitted to medical floor.  Patient was diuresed with IV Lasix 80 mg every 8 hourly.  Patient was seen by nephrology during the hospitalization.  Patient was dialyzed and excess fluid removed.  Shortness of breath improved.  Her pedal edema also improved with excess fluid removal.  Patient will be discharged home on oral Lasix and continue dialysis as outpatient.  Case was discussed with nephrologist prior to discharge.  Patient hemodynamically stable will be discharged home.  CONSULTS OBTAINED:  Treatment Team:  Anthonette Legato, MD  DRUG ALLERGIES:   Allergies  Allergen Reactions  . Chlorhexidine Itching    Pt reports itching this AM post wipes w/ CHG, no redness or irritation assessed    DISCHARGE MEDICATIONS:   Allergies as of 08/30/2018      Reactions   Chlorhexidine Itching   Pt reports itching this AM post wipes w/ CHG, no redness or irritation assessed  Medication List    TAKE these medications   albuterol 108 (90 Base) MCG/ACT inhaler Commonly known as: VENTOLIN HFA Inhale 2 puffs into the lungs every 6 (six) hours as needed for wheezing or shortness of breath.   albuterol (2.5 MG/3ML) 0.083% nebulizer solution Commonly known as: PROVENTIL Take 3 mLs (2.5 mg total) by nebulization every 6 (six) hours as needed for wheezing or shortness of breath.   B-12 PO Take 1  tablet by mouth daily.   budesonide-formoterol 160-4.5 MCG/ACT inhaler Commonly known as: SYMBICORT Inhale 2 puffs into the lungs 2 (two) times daily.   clonazePAM 1 MG tablet Commonly known as: KLONOPIN Take 1 tablet (1 mg total) by mouth 2 (two) times daily. What changed: additional instructions   cyclobenzaprine 10 MG tablet Commonly known as: FLEXERIL Take one tablet every 8 hours as needed   DULoxetine 30 MG capsule Commonly known as: CYMBALTA Take 1 capsule (30 mg total) by mouth daily.   FISH OIL PO Take 1 capsule by mouth 2 (two) times daily.   fluticasone 50 MCG/ACT nasal spray Commonly known as: FLONASE Place 2 sprays into both nostrils as needed.   furosemide 80 MG tablet Commonly known as: LASIX Take 1 tablet (80 mg total) by mouth 2 (two) times daily.   hydrALAZINE 25 MG tablet Commonly known as: APRESOLINE Take 1 tablet (25 mg total) by mouth 3 (three) times daily.   ipratropium 17 MCG/ACT inhaler Commonly known as: ATROVENT HFA Inhale 2 puffs into the lungs every 6 (six) hours as needed.   Iron Supplement 325 (65 FE) MG tablet Generic drug: ferrous sulfate Take 1 tablet by mouth daily.   isosorbide mononitrate 30 MG 24 hr tablet Commonly known as: IMDUR Take 1 tablet (30 mg total) by mouth daily.   levothyroxine 25 MCG tablet Commonly known as: Synthroid Take 1 tablet (25 mcg total) by mouth daily before breakfast.   lidocaine 2 % solution Commonly known as: XYLOCAINE Use as directed 15 mLs in the mouth or throat as needed for mouth pain.   Lysine 1000 MG Tabs Take 1 tablet by mouth daily.   Magnesium Oxide 400 (240 Mg) MG Tabs Take 400 mg by mouth 2 (two) times daily.   metoprolol succinate 50 MG 24 hr tablet Commonly known as: TOPROL-XL Take 1 tablet (50 mg total) by mouth daily. Take with or immediately following a meal.   MULTIVITAMIN PO Take 1 tablet by mouth daily.   ondansetron 8 MG disintegrating tablet Commonly known as:  ZOFRAN-ODT Take 1 tablet (8 mg total) by mouth every 8 (eight) hours as needed for nausea or vomiting.   oxyCODONE-acetaminophen 10-325 MG tablet Commonly known as: Percocet Take 1 tablet by mouth every 8 (eight) hours as needed for pain.   SUMAtriptan 100 MG tablet Commonly known as: IMITREX May repeat in 2 hours if headache persists or recurs.   VITAMIN C PO Take 1 tablet by mouth daily.   Vitamin D-1000 Max St 25 MCG (1000 UT) tablet Generic drug: Cholecalciferol Take 1 tablet by mouth 2 (two) times daily.       Today  Patient seen and evaluated today No shortness of breath No chest pain Tolerating diet well Hemodynamically stable  VITAL SIGNS:  Blood pressure (!) 125/100, pulse 72, temperature 97.8 F (36.6 C), temperature source Oral, resp. rate 18, height 5\' 2"  (1.575 m), weight 75.2 kg, SpO2 97 %.  I/O:    Intake/Output Summary (Last 24 hours) at 08/30/2018 1230 Last data  filed at 08/29/2018 1838 Gross per 24 hour  Intake -  Output 2000 ml  Net -2000 ml    PHYSICAL EXAMINATION:  Physical Exam  GENERAL:  63 y.o.-year-old patient lying in the bed with no acute distress.  LUNGS: Normal breath sounds bilaterally, no wheezing, rales,rhonchi or crepitation. No use of accessory muscles of respiration.  CARDIOVASCULAR: S1, S2 normal. No murmurs, rubs, or gallops.  ABDOMEN: Soft, non-tender, non-distended. Bowel sounds present. No organomegaly or mass.  NEUROLOGIC: Moves all 4 extremities. PSYCHIATRIC: The patient is alert and oriented x 3.  SKIN: No obvious rash, lesion, or ulcer.   DATA REVIEW:   CBC Recent Labs  Lab 08/30/18 0431  WBC 4.7  HGB 7.1*  HCT 22.5*  PLT 74*    Chemistries  Recent Labs  Lab 08/28/18 1443  08/30/18 0431  NA 134*   < > 138  K 4.3   < > 4.1  CL 95*   < > 96*  CO2 29   < > 32  GLUCOSE 121*   < > 102*  BUN 34*   < > 21  CREATININE 5.21*   < > 3.99*  CALCIUM 7.9*   < > 8.2*  AST 37  --   --   ALT 17  --   --    ALKPHOS 61  --   --   BILITOT 0.8  --   --    < > = values in this interval not displayed.    Cardiac Enzymes Recent Labs  Lab 08/28/18 1443  TROPONINI 0.07*    Microbiology Results  Results for orders placed or performed during the hospital encounter of 08/28/18  SARS Coronavirus 2 (CEPHEID - Performed in [facility] hospital lab), Hosp Order     Status: None   Collection Time: 08/28/18  4:00 PM   Specimen: Nasopharyngeal Swab  Result Value Ref Range Status   SARS Coronavirus 2 NEGATIVE NEGATIVE Final    Comment: (NOTE) If result is NEGATIVE SARS-CoV-2 target nucleic acids are NOT DETECTED. The SARS-CoV-2 RNA is generally detectable in upper and lower  respiratory specimens during the acute phase of infection. The lowest  concentration of SARS-CoV-2 viral copies this assay can detect is 250  copies / mL. A negative result does not preclude SARS-CoV-2 infection  and should not be used as the sole basis for treatment or other  patient management decisions.  A negative result may occur with  improper specimen collection / handling, submission of specimen other  than nasopharyngeal swab, presence of viral mutation(s) within the  areas targeted by this assay, and inadequate number of viral copies  (<250 copies / mL). A negative result must be combined with clinical  observations, patient history, and epidemiological information. If result is POSITIVE SARS-CoV-2 target nucleic acids are DETECTED. The SARS-CoV-2 RNA is generally detectable in upper and lower  respiratory specimens dur ing the acute phase of infection.  Positive  results are indicative of active infection with SARS-CoV-2.  Clinical  correlation with patient history and other diagnostic information is  necessary to determine patient infection status.  Positive results do  not rule out bacterial infection or co-infection with other viruses. If result is PRESUMPTIVE POSTIVE SARS-CoV-2 nucleic acids MAY BE PRESENT.    A presumptive positive result was obtained on the submitted specimen  and confirmed on repeat testing.  While 2019 novel coronavirus  (SARS-CoV-2) nucleic acids may be present in the submitted sample  additional confirmatory testing may be necessary  for epidemiological  and / or clinical management purposes  to differentiate between  SARS-CoV-2 and other Sarbecovirus currently known to infect humans.  If clinically indicated additional testing with an alternate test  methodology [phone]) is advised. The SARS-CoV-2 RNA is generally  detectable in upper and lower respiratory sp ecimens during the acute  phase of infection. The expected result is Negative. Fact Sheet for Patients:  StrictlyIdeas.no Fact Sheet for Healthcare Providers: BankingDealers.co.za This test is not yet approved or cleared by the [place] FDA and has been authorized for detection and/or diagnosis of SARS-CoV-2 by FDA under an Emergency Use Authorization (EUA).  This EUA will remain in effect (meaning this test can be used) for the duration of the COVID-19 declaration under Section 564(b)(1) of the Act, 21 U.S.C. section 360bbb-3(b)(1), unless the authorization is terminated or revoked sooner. Performed at [facility], [address], [place], [place] [address]     RADIOLOGY:  No results found.  Follow up with PCP in 1 week.  Management plans discussed with the patient, family and they are in agreement.  CODE STATUS: Full code    Code Status Orders  (From admission, onward)         Start     Ordered   08/28/18 1953  Full code  Continuous     08/28/18 1952        Code Status History    Date Active Date Inactive Code Status Order ID Comments User Context   08/06/2018 2355 08/18/2018 1650 Full Code [id number]  Mayer Camel, NP ED   Advance Care Planning Activity    Advance Directive Documentation     Most Recent Value  Type of  Advance Directive  Living will, Healthcare Power of Attorney  Pre-existing out of facility DNR order (yellow form or pink MOST form)  -  "MOST" Form in Place?  -      TOTAL TIME TAKING CARE OF THIS PATIENT ON DAY OF DISCHARGE: more than 35 minutes.   Saundra Shelling M.D on 08/30/2018 at 12:30 PM  Between 7am to 6pm - Pager - [phone]  After 6pm go to [email or web address] - password [email or web address] [facility]  Office  [phone]  CC: Primary care physician; Steele Sizer, MD  Note: This dictation was prepared with Dragon dictation along with smaller phrase technology. Any transcriptional errors that result from this process are unintentional.

## 2018-08-30 NOTE — Progress Notes (Signed)
Initial Nutrition Assessment  RD working remotely.  DOCUMENTATION CODES:   Not applicable  INTERVENTION:  Provide Nepro Shake po BID, each supplement provides 425 kcal and 19 grams protein.  Provide Rena-vite QHS.  Continue vitamin C 250 mg daily.  NUTRITION DIAGNOSIS:   Increased nutrient needs related to catabolic illness(stage IIIc ovarian carcinoma, ESRD on HD) as evidenced by estimated needs.  GOAL:   Patient will meet greater than or equal to 90% of their needs  MONITOR:   PO intake, Supplement acceptance, Labs, Weight trends, I & O's  REASON FOR ASSESSMENT:   Malnutrition Screening Tool    ASSESSMENT:   63 year old with PMHx of anxiety, arthritis, asthma, CHF, stage IIIc high-grade serous ovarian carcinoma last received gemcitabine 07/27/2018 (no further tx planned at this time), AKI on CKD stage III (now ESRD per Nephrology note) admitted with acute on chronic diastolic heart failure exacerbation.   Attempted to call patient over the phone for nutrition/weight history but she was unable to answer phone. Noted she was previously followed by RD at cancer center for poor appetite and weight loss. Patient also screening here for weight loss and decreased appetite. Per chart she ate 100% of breakfast yesterday morning but no other meal completion has been documented.  Weight history in chart fluctuates between 70.8-78.2 kg, so difficult to trend. Patient currently 75.2 kg (165.79 lbs). Current weight likely falsely elevated from fluid.  Medications reviewed and include: vitamin D3 1000 units BID, Epogen 4000 units during HD, ferrous sulfate 325 mg daily, Lasix 80 mg TID IV, levothyroxine, magnesium oxide 400 mg BID, MVI daily, Lovaza 2 grams daily, vitamin B12 100 micrograms daily, vitamin C 250 mg daily. Potassium WNL and last Phosphorus WNL.  Labs reviewed: Chloride 96, Creatinine 3.99.  NUTRITION - FOCUSED PHYSICAL EXAM:  Unable to complete at this time.  Diet  Order:   Diet Order            Diet renal with fluid restriction Fluid restriction: 1200 mL Fluid; Room service appropriate? Yes; Fluid consistency: Thin  Diet effective now             EDUCATION NEEDS:   No education needs have been identified at this time  Skin:  Skin Assessment: Reviewed RN Assessment(ecchymosis)  Last BM:  08/15/2018 per chart  Height:   Ht Readings from Last 1 Encounters:  08/28/18 5\' 2"  (1.575 m)   Weight:   Wt Readings from Last 1 Encounters:  08/30/18 75.2 kg   Ideal Body Weight:  50 kg  BMI:  Body mass index is 30.32 kg/m.  Estimated Nutritional Needs:   Kcal:  2000-2200  Protein:  100-110 grams  Fluid:  UOP + 1 L  Willey Blade, MS, RD, LDN Office: [phone] Pager: [phone] After Hours/Weekend Pager: [phone]

## 2018-08-31 ENCOUNTER — Telehealth: Payer: Self-pay

## 2018-08-31 ENCOUNTER — Inpatient Hospital Stay: Payer: PPO | Admitting: Family Medicine

## 2018-08-31 NOTE — Telephone Encounter (Signed)
Transition Care Management Follow-up Telephone Call  Date of discharge and from where: 08/30/18 [facility]  How have you been since you were released from the hospital? Pt states she is doing okay, resting today. No pain. Has not scheduled any follow up appts as of now.   Any questions or concerns? No   Items Reviewed:  Did the pt receive and understand the discharge instructions provided? Yes   Medications obtained and verified? Yes   Any new allergies since your discharge? No   Dietary orders reviewed? Yes  Do you have support at home? Yes   Functional Questionnaire: (I = Independent and D = Dependent) ADLs: I  Bathing/Dressing- I  Meal Prep- I  Eating- I  Maintaining continence- I  Transferring/Ambulation- I  Managing Meds- I  Follow up appointments reviewed:   PCP Hospital f/u appt confirmed? No  pt to make appt.   [facility] f/u appt confirmed? Yes Scheduled to see [facility] on 09/17/18  Are transportation arrangements needed? No   If their condition worsens, is the pt aware to call PCP or go to the Emergency Dept.? Yes  Was the patient provided with contact information for the PCP's office or ED? Yes  Was to pt encouraged to call back with questions or concerns? Yes

## 2018-08-31 NOTE — Progress Notes (Deleted)
Cardiology Office Note Date:  08/31/2018  Patient ID:  Jessica Burgess, Jessica Burgess March 06, 1956, MRN [id number] PCP:  Steele Sizer, MD  Cardiologist:  Dr. Saunders Revel, MD  ***refresh   Chief Complaint: ***  History of Present Illness: Jessica Burgess is a 63 y.o. female with history of ***   Past Medical History:  Diagnosis Date  . Allergic rhinitis, cause unspecified   . Anxiety state, unspecified   . Arthritis   . Asthma    only when sick   . Backache, unspecified   . Bronchitis    hx of when get sick  . Cancer ([place])    skin cancer , basal cell   . Cancer ([place]) 11/2016   ovarian  . Cervicalgia   . Complication of anesthesia   . Dermatophytosis of nail   . Dysmetabolic syndrome X   . Encounter for long-term (current) use of other medications   . Esophageal reflux   . Insomnia, unspecified   . Leukocytosis, unspecified   . Migraine without aura, without mention of intractable migraine without mention of status migrainosus   . Other and unspecified hyperlipidemia   . Other malaise and fatigue   . Overweight(278.02)   . Personal history of chemotherapy now   ovarian  . PONV (postoperative nausea and vomiting)   . Renal insufficiency   . Spinal stenosis in cervical region   . Symptomatic menopausal or female climacteric states   . Unspecified disorder of skin and subcutaneous tissue   . Unspecified vitamin D deficiency     Past Surgical History:  Procedure Laterality Date  . ABDOMINAL HYSTERECTOMY    . ANTERIOR CERVICAL DECOMP/DISCECTOMY FUSION N/A 06/07/2015   Procedure: Cervical three - four and Cervical six- seven anterior cervical decompression with fusion interbody prosthesis plating and bonegraft;  Surgeon: Newman Pies, MD;  Location: [facility];  Service: Neurosurgery;  Laterality: N/A;  C34 and C67 anterior cervical decompression with fusion interbody prosthesis plating and bonegraft  . BACK SURGERY     x2 Lower   . DIALYSIS/PERMA CATHETER INSERTION N/A  08/17/2018   Procedure: DIALYSIS/PERMA CATHETER INSERTION;  Surgeon: Algernon Huxley, MD;  Location: [facility];  Service: Cardiovascular;  Laterality: N/A;  . EVACUATION OF CERVICAL HEMATOMA N/A 06/14/2015   Procedure: EVACUATION OF CERVICAL HEMATOMA;  Surgeon: Newman Pies, MD;  Location: [facility];  Service: Neurosurgery;  Laterality: N/A;  . NECK SURGERY     x3  . TONSILLECTOMY      No outpatient medications have been marked as taking for the 09/04/18 encounter (Appointment) with Rise Mu, PA-C.    Allergies:   Chlorhexidine   Social History:  The patient  reports that she quit smoking about 19 years ago. Her smoking use included cigarettes. She started smoking about 39 years ago. She has a 30.00 pack-year smoking history. She has never used smokeless tobacco. She reports previous alcohol use. She reports that she does not use drugs.   Family History:  The patient's family history includes Breast cancer in her paternal aunt; Dementia in her father; Depression in her mother; Diabetes in her father; Hyperlipidemia in her brother, brother, and father; Migraines in her mother.  ROS:   ROS   PHYSICAL EXAM: *** VS:  There were no vitals taken for this visit. BMI: There is no height or weight on file to calculate BMI.  Physical Exam   EKG:  Was ordered and interpreted by me today. Shows ***  Recent Labs:  08/21/2018: TSH 5.94 08/28/2018: ALT 17; B Natriuretic Peptide >4,500.0 08/30/2018: BUN 21; Creatinine, Ser 3.99; Hemoglobin 7.1; Platelets 74; Potassium 4.1; Sodium 138  No results found for requested labs within last 8760 hours.   Estimated Creatinine Clearance: 13.7 mL/min (A) (by C-G formula based on SCr of 3.99 mg/dL (H)).   Wt Readings from Last 3 Encounters:  08/30/18 165 lb 12.6 oz (75.2 kg)  08/28/18 164 lb (74.4 kg)  08/27/18 156 lb (70.8 kg)     Other studies reviewed: Additional studies/records reviewed today include: summarized above  ASSESSMENT AND  PLAN:  1. ***  Disposition: F/u with Dr. Saunders Revel or an APP in ***.  Current medicines are reviewed at length with the patient today.  The patient did not have any concerns regarding medicines.  Signed, Christell Faith, PA-C 08/31/2018 8:09 AM     [facility] [address] [place], [place] [address] [phone]

## 2018-09-01 LAB — CA 125: Cancer Antigen (CA) 125: 519 U/mL — ABNORMAL HIGH (ref 0.0–38.1)

## 2018-09-01 NOTE — Telephone Encounter (Signed)
Called and spoke to pt to schedule a HFU, pt declined at this time. She states she is exhausted and has dialysis all this week and will call back next week if is she available to come in.

## 2018-09-02 ENCOUNTER — Encounter: Payer: Self-pay | Admitting: Oncology

## 2018-09-03 ENCOUNTER — Ambulatory Visit: Payer: Self-pay

## 2018-09-03 ENCOUNTER — Telehealth: Payer: Self-pay | Admitting: Family Medicine

## 2018-09-03 NOTE — Telephone Encounter (Signed)
Patient calling to apologize for being "gruff" with someone the other day and states that she was having a bad day. Pt wanted to confirm that she has an appt next Tuesday 09/08/18 and that appt previously scheduled for today was cancelled. Confirmed that appt was scheduled for 09/08/18 at 11:20 am and that appt today was cancelled. Pt verbalized understanding. No additional concerns voiced at this time.

## 2018-09-04 ENCOUNTER — Ambulatory Visit: Payer: PPO | Admitting: Physician Assistant

## 2018-09-05 NOTE — Progress Notes (Signed)
[place]  [phone] [phone]  ID: Jessica Burgess OB: February 25, 1956  MR#: [id number]  FMB#:[id number]  Patient Care Team: Steele Sizer, MD as PCP - General (Family Medicine) Lloyd Huger, MD as Consulting Physician (Oncology) Mellody Drown, MD as Consulting Physician (Obstetrics and Gynecology) Cathi Roan, [facility] (Pharmacist) Benedetto Goad, RN as Case Manager Clent Jacks, RN as Registered Nurse  I connected with Jessica Burgess on 09/13/18 at  2:45 PM EDT by video enabled telemedicine visit and verified that I am speaking with the correct person using two identifiers.   I discussed the limitations, risks, security and privacy concerns of performing an evaluation and management service by telemedicine and the availability of in-person appointments. I also discussed with the patient that there may be a patient responsible charge related to this service. The patient expressed understanding and agreed to proceed.   Other persons participating in the visit and their role in the encounter: Patient, patient's sister, MD  Patients location: Home Providers location: Clinic  CHIEF COMPLAINT: Stage IIIc high-grade serous ovarian carcinoma, end-stage renal 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  INTERVAL HISTORY: Patient agreed to video enabled telemedicine visit for further evaluation and discussion of possible treatment planning.  She now has end-stage renal disease and requires dialysis 4 days a week.  She continues to have chronic shortness of breath and requires oxygen.  She has chronic weakness and fatigue.  She denies any recent fevers or illnesses.  She has no neurologic complaints.  She denies any chest pain, cough, or hemoptysis. She denies any nausea, vomiting, constipation, or diarrhea.  She denies any abdominal pain or bloating.  Patient offers no further specific complaints today.  REVIEW OF SYSTEMS:   Review  of Systems  Constitutional: Positive for malaise/fatigue. Negative for fever and weight loss.  HENT: Negative.  Negative for sore throat.   Eyes: Negative.  Negative for blurred vision.  Respiratory: Positive for shortness of breath. Negative for cough.   Cardiovascular: Positive for leg swelling. Negative for chest pain.  Gastrointestinal: Negative.  Negative for abdominal pain, blood in stool, constipation, diarrhea, melena, nausea and vomiting.  Genitourinary: Negative.  Negative for dysuria.  Musculoskeletal: Positive for back pain and joint pain. Negative for neck pain.  Skin: Negative.  Negative for itching and rash.  Neurological: Positive for tingling, sensory change and weakness. Negative for focal weakness.  Psychiatric/Behavioral: The patient is nervous/anxious.      As per HPI. Otherwise, a complete review of systems is negative.  PAST MEDICAL HISTORY: Past Medical History:  Diagnosis Date   Allergic rhinitis, cause unspecified    Anxiety state, unspecified    Arthritis    Asthma    only when sick    Backache, unspecified    Bronchitis    hx of when get sick   Cancer [facility])    skin cancer , basal cell    Cancer ([place]) 11/2016   ovarian   Cervicalgia    CHF (congestive heart failure) (HCC)    Complication of anesthesia    Dermatophytosis of nail    Dysmetabolic syndrome X    Encounter for long-term (current) use of other medications    Esophageal reflux    Hypertension    Insomnia, unspecified    Leukocytosis, unspecified    Migraine without aura, without mention of intractable migraine without mention of status migrainosus    Other and unspecified hyperlipidemia    Other malaise and fatigue  Overweight(278.02)    Personal history of chemotherapy now   ovarian   PONV (postoperative nausea and vomiting)    Renal insufficiency    Spinal stenosis in cervical region    Symptomatic menopausal or female climacteric states     Unspecified disorder of skin and subcutaneous tissue    Unspecified vitamin D deficiency     PAST SURGICAL HISTORY: Past Surgical History:  Procedure Laterality Date   ABDOMINAL HYSTERECTOMY     ANTERIOR CERVICAL DECOMP/DISCECTOMY FUSION N/A 06/07/2015   Procedure: Cervical three - four and Cervical six- seven anterior cervical decompression with fusion interbody prosthesis plating and bonegraft;  Surgeon: Newman Pies, MD;  Location: [place] [facility];  Service: Neurosurgery;  Laterality: N/A;  C34 and C67 anterior cervical decompression with fusion interbody prosthesis plating and bonegraft   BACK SURGERY     x2 Lower    DIALYSIS/PERMA CATHETER INSERTION N/A 08/17/2018   Procedure: DIALYSIS/PERMA CATHETER INSERTION;  Surgeon: Algernon Huxley, MD;  Location: [place] CV LAB;  Service: Cardiovascular;  Laterality: N/A;   EVACUATION OF CERVICAL HEMATOMA N/A 06/14/2015   Procedure: EVACUATION OF CERVICAL HEMATOMA;  Surgeon: Newman Pies, MD;  Location: [place] [facility];  Service: Neurosurgery;  Laterality: N/A;   NECK SURGERY     x3   TONSILLECTOMY      FAMILY HISTORY: Family History  Problem Relation Age of Onset   Depression Mother    Migraines Mother    Dementia Father    Diabetes Father    Hyperlipidemia Father    Hyperlipidemia Brother    Hyperlipidemia Brother    Breast cancer Paternal Aunt        26s    ADVANCED DIRECTIVES (Y/N):  N  HEALTH MAINTENANCE: Social History   Tobacco Use   Smoking status: Former Smoker    Packs/day: 1.50    Years: 20.00    Pack years: 30.00    Types: Cigarettes    Start date: 03/19/1979    Quit date: 08/25/1999    Years since quitting: 19.0   Smokeless tobacco: Never Used   Tobacco comment: smoking cessation materials not required  Substance Use Topics   Alcohol use: Not Currently    Alcohol/week: 0.0 standard drinks   Drug use: No     Colonoscopy:  PAP:  Bone density:  Lipid panel:  Allergies  Allergen  Reactions   Chlorhexidine Itching    Pt reports itching this AM post wipes w/ CHG, no redness or irritation assessed    Current Outpatient Medications  Medication Sig Dispense Refill   albuterol (PROVENTIL HFA;VENTOLIN HFA) 108 (90 Base) MCG/ACT inhaler Inhale 2 puffs into the lungs every 6 (six) hours as needed for wheezing or shortness of breath. 1 Inhaler 2   Ascorbic Acid (VITAMIN C PO) Take 1 tablet by mouth daily.      budesonide-formoterol (SYMBICORT) 160-4.5 MCG/ACT inhaler Inhale 2 puffs into the lungs 2 (two) times daily.     clonazePAM (KLONOPIN) 1 MG tablet Take 1 tablet (1 mg total) by mouth 2 (two) times daily. (Patient taking differently: Take 1 mg by mouth 2 (two) times daily. And takes a 3rd one if needed for anxiety) 30 tablet 0   Cyanocobalamin (B-12 PO) Take 1 tablet by mouth daily.      cyclobenzaprine (FLEXERIL) 10 MG tablet Take one tablet every 8 hours as needed 30 tablet 3   DULoxetine (CYMBALTA) 30 MG capsule Take 1 capsule (30 mg total) by mouth daily. 30 capsule 1  ferrous sulfate (IRON SUPPLEMENT) 325 (65 FE) MG tablet Take 1 tablet by mouth daily.     furosemide (LASIX) 80 MG tablet Take 1 tablet (80 mg total) by mouth 2 (two) times daily. 180 tablet 3   hydrALAZINE (APRESOLINE) 25 MG tablet Take 1 tablet (25 mg total) by mouth 3 (three) times daily. 90 tablet 1   ipratropium (ATROVENT HFA) 17 MCG/ACT inhaler Inhale 2 puffs into the lungs every 6 (six) hours as needed.      isosorbide mononitrate (IMDUR) 30 MG 24 hr tablet Take 1 tablet (30 mg total) by mouth daily. 30 tablet 2   levothyroxine (SYNTHROID) 25 MCG tablet Take 1 tablet (25 mcg total) by mouth daily before breakfast. 30 tablet 2   lidocaine (XYLOCAINE) 2 % solution Use as directed 15 mLs in the mouth or throat as needed for mouth pain. (Patient taking differently: Use as directed 15 mLs in the mouth or throat as needed for mouth pain. Pt using topically for itching) 100 mL 0   Lysine  1000 MG TABS Take 1 tablet by mouth daily.      Magnesium Oxide 400 (240 Mg) MG TABS Take 400 mg by mouth 2 (two) times daily.      metoprolol succinate (TOPROL-XL) 50 MG 24 hr tablet Take 1 tablet (50 mg total) by mouth daily. Take with or immediately following a meal. 30 tablet 2   mometasone-formoterol (DULERA) 200-5 MCG/ACT AERO Inhale 2 puffs into the lungs 2 (two) times daily.     Multiple Vitamins-Minerals (MULTIVITAMIN PO) Take 1 tablet by mouth daily.      Omega-3 Fatty Acids (FISH OIL PO) Take 1 capsule by mouth 2 (two) times daily.      ondansetron (ZOFRAN-ODT) 8 MG disintegrating tablet Take 1 tablet (8 mg total) by mouth every 8 (eight) hours as needed for nausea or vomiting. 60 tablet 3   oxyCODONE-acetaminophen (PERCOCET) 10-325 MG tablet Take 1 tablet by mouth every 8 (eight) hours as needed for pain. (Patient taking differently: Take 1 tablet by mouth every 8 (eight) hours as needed for pain. Pt instructions on bottle q6h PRN) 20 tablet 0   polyethylene glycol powder (GLYCOLAX/MIRALAX) 17 GM/SCOOP powder Take 17 g by mouth daily. PRN     SUMAtriptan (IMITREX) 100 MG tablet May repeat in 2 hours if headache persists or recurs. 9 tablet 0   albuterol (PROVENTIL) (2.5 MG/3ML) 0.083% nebulizer solution Take 3 mLs (2.5 mg total) by nebulization every 6 (six) hours as needed for wheezing or shortness of breath. (Patient not taking: Reported on 09/11/2018) 75 mL 2   Cholecalciferol (VITAMIN D-1000 MAX ST) 1000 UNITS tablet Take 1 tablet by mouth 2 (two) times daily.     fluticasone (FLONASE) 50 MCG/ACT nasal spray Place 2 sprays into both nostrils as needed. 48 g 1   No current facility-administered medications for this visit.    Facility-Administered Medications Ordered in Other Visits  Medication Dose Route Frequency Provider Last Rate Last Dose   0.9 %  sodium chloride infusion   Intravenous Once Grayland Ormond, Kathlene November, MD       0.9 %  sodium chloride infusion   Intravenous  Once Grayland Ormond, Kathlene November, MD       dexamethasone (DECADRON) 20 mg in sodium chloride 0.9 % 50 mL IVPB  20 mg Intravenous Once Lloyd Huger, MD       dexamethasone (DECADRON) injection 10 mg  10 mg Intravenous Once Lloyd Huger, MD  sodium chloride flush (NS) 0.9 % injection 10 mL  10 mL Intravenous PRN Lloyd Huger, MD   10 mL at 06/16/17 0854    OBJECTIVE: There were no vitals filed for this visit.   There is no height or weight on file to calculate BMI.    ECOG FS:2 - Symptomatic, <50% confined to bed  General: Ill-appearing, no acute distress. HEENT: Normocephalic. Neuro: Alert, answering all questions appropriately. Cranial nerves grossly intact. Skin: No rashes or petechiae noted. Psych: Normal affect.  LAB RESULTS:  Lab Results  Component Value Date   NA 138 08/30/2018   K 4.1 08/30/2018   CL 96 (L) 08/30/2018   CO2 32 08/30/2018   GLUCOSE 102 (H) 08/30/2018   BUN 21 08/30/2018   CREATININE 3.99 (H) 08/30/2018   CALCIUM 8.2 (L) 08/30/2018   PROT 5.5 (L) 08/28/2018   ALBUMIN 3.0 (L) 08/28/2018   AST 37 08/28/2018   ALT 17 08/28/2018   ALKPHOS 61 08/28/2018   BILITOT 0.8 08/28/2018   GFRNONAA 11 (L) 08/30/2018   GFRAA 13 (L) 08/30/2018    Lab Results  Component Value Date   WBC 4.7 08/30/2018   NEUTROABS 4.7 08/28/2018   HGB 7.1 (L) 08/30/2018   HCT 22.5 (L) 08/30/2018   MCV 112.5 (H) 08/30/2018   PLT 74 (L) 08/30/2018     STUDIES: Dg Chest 2 View  Result Date: 08/28/2018 CLINICAL DATA:  History of ovarian cancer.  Pleural effusions. EXAM: CHEST - 2 VIEW COMPARISON:  Single-view of the chest 08/15/2018 and 08/13/2018. CT chest 08/09/2018. FINDINGS: Port-A-Cath and dialysis catheter are again seen. Dialysis catheter tip is now positioned just within the right atrium. There are small bilateral pleural effusions are new since the most recent exam. Pulmonary vascular congestion and cardiomegaly noted. No acute bony abnormality.  IMPRESSION: Small bilateral pleural effusions are new since the most recent examination. Mild cardiomegaly and vascular congestion. Electronically Signed   By: Inge Rise M.D.   On: 08/28/2018 14:44   Dg Chest Port 1 View  Result Date: 08/15/2018 CLINICAL DATA:  Catheter placement EXAM: PORTABLE CHEST 1 VIEW COMPARISON:  08/13/2018, 08/12/2018, 08/07/2018 FINDINGS: Right-sided central venous port tip over the SVC. New left IJ central venous catheter tip over the SVC. No pneumothorax. Cardiomegaly with vascular congestion and bilateral perihilar hazy and ground-glass opacities suspect for edema. Small bilateral effusions. Left basilar consolidation. IMPRESSION: 1. Left IJ central venous catheter tip over the SVC. No pneumothorax. 2. Cardiomegaly with vascular congestion, moderate pulmonary edema and continued small pleural effusions. Left basilar consolidation without change. Electronically Signed   By: Donavan Foil M.D.   On: 08/15/2018 03:57    ONCOLOGY HISTORY: Patient underwent debulking surgery at [facility] on November 19, 2016. She received one infusion preoperative infusion of Keytruda on November 11, 2016 as part of a clinical trial.  She initiated adjuvant carboplatinum and Taxol on December 16, 2016.  This was continued through April 28, 2017 at which time Taxol was discontinued secondary to worsening neuropathy.  Patient then received single agent carboplatinum for additional 2 infusions.  She was then noted to have progression of disease and was switched to Avastin and doxorubicin on August 18, 2017 this was discontinued on November 24, 2017 secondary to declining performance status.  Palliative gemcitabine was initiated on February 20, 2018 which was then discontinued Jul 27, 2018 secondary to declining performance status and end-stage renal disease..   ASSESSMENT: Stage IIIc high-grade serous ovarian carcinoma  PLAN:  1.  Stage IIIc high-grade serous ovarian carcinoma: See oncology  history as above.  Patient last received treatment with single agent gemcitabine on Jul 27, 2018.  Her most recent CA-125 from August 30, 2018 was significantly increased to 519 likely indicating progressive disease.  Reinitiating gemcitabine is a possibility, but patient now has dialysis 4 days a week making infusional treatments difficult.  Mekinist was briefly discussed, but this is only useful in low-grade serous carcinoma.  We are attempting to get germline testing and MSI testing results from her original pathology at [facility]. Return to clinic in several weeks for further evaluation and continued assessment of treatment planning.   2.  Shortness of breath: Likely multifactorial.  Continue dialysis 4 days a week as planned on Tuesday, Thursday, Friday, and Saturday.  Patient also has significant pulmonary hypertension that is likely contributing. 3.  Acute renal failure: Continue dialysisas above. 4.  Pain: Patient does not complain of this today.  Continue current narcotic regimen. 5.  Anemia: Patient's hemoglobin is significantly decreased, but stable at 7.1.  Okay to give Procrit with dialysis.  Continue to monitor closely and will transfuse if it falls below 7.0.  This will likely have to be done in conjunction with dialysis given her current fluid overload. 6.  Thrombocytopenia: Chronic and unchanged, monitor.  I provided 25 minutes of face-to-face video visit time during this encounter, and > 50% was spent counseling as documented under my assessment & plan.  Lloyd Huger, MD   09/13/2018 8:12 AM

## 2018-09-07 ENCOUNTER — Encounter: Payer: Self-pay | Admitting: Oncology

## 2018-09-08 ENCOUNTER — Other Ambulatory Visit: Payer: Self-pay

## 2018-09-08 ENCOUNTER — Ambulatory Visit (INDEPENDENT_AMBULATORY_CARE_PROVIDER_SITE_OTHER): Payer: PPO

## 2018-09-08 VITALS — BP 144/88 | HR 74 | Temp 98.6°F | Resp 17 | Ht 62.0 in | Wt 160.1 lb

## 2018-09-08 DIAGNOSIS — Z0000 Encounter for general adult medical examination without abnormal findings: Secondary | ICD-10-CM | POA: Diagnosis not present

## 2018-09-08 NOTE — Patient Instructions (Signed)
Jessica Burgess , Thank you for taking time to come for your Medicare Wellness Visit. I appreciate your ongoing commitment to your health goals. Please review the following plan we discussed and let me know if I can assist you in the future.   Screening recommendations/referrals: Colonoscopy: done 02/28/14.  Mammogram: done 03/12/17 Bone Density: due age 63 Recommended yearly ophthalmology/optometry visit for glaucoma screening and checkup Recommended yearly dental visit for hygiene and checkup  Vaccinations: Influenza vaccine: done 12/03/17 Pneumococcal vaccine: done 11/12/16 Tdap vaccine: done 11/11/11 Shingles vaccine: Shingrix discussed. Please contact your pharmacy for coverage information.   Advanced directives: Please bring a copy of your health care power of attorney and living will to the office at your convenience.  Conditions/risks identified: recommend follow up with chronic care management team for medication management  Next appointment: Please follow up in one year for your Medicare Annual Wellness visit.    Preventive Care 40-64 Years, Female Preventive care refers to lifestyle choices and visits with your health care provider that can promote health and wellness. What does preventive care include?  A yearly physical exam. This is also called an annual well check.  Dental exams once or twice a year.  Routine eye exams. Ask your health care provider how often you should have your eyes checked.  Personal lifestyle choices, including:  Daily care of your teeth and gums.  Regular physical activity.  Eating a healthy diet.  Avoiding tobacco and drug use.  Limiting alcohol use.  Practicing safe sex.  Taking low-dose aspirin daily starting at age 77.  Taking vitamin and mineral supplements as recommended by your health care provider. What happens during an annual well check? The services and screenings done by your health care provider during your annual well  check will depend on your age, overall health, lifestyle risk factors, and family history of disease. Counseling  Your health care provider may ask you questions about your:  Alcohol use.  Tobacco use.  Drug use.  Emotional well-being.  Home and relationship well-being.  Sexual activity.  Eating habits.  Work and work Statistician.  Method of birth control.  Menstrual cycle.  Pregnancy history. Screening  You may have the following tests or measurements:  Height, weight, and BMI.  Blood pressure.  Lipid and cholesterol levels. These may be checked every 5 years, or more frequently if you are over 64 years old.  Skin check.  Lung cancer screening. You may have this screening every year starting at age 37 if you have a 30-pack-year history of smoking and currently smoke or have quit within the past 15 years.  Fecal occult blood test (FOBT) of the stool. You may have this test every year starting at age 28.  Flexible sigmoidoscopy or colonoscopy. You may have a sigmoidoscopy every 5 years or a colonoscopy every 10 years starting at age 101.  Hepatitis C blood test.  Hepatitis B blood test.  Sexually transmitted disease (STD) testing.  Diabetes screening. This is done by checking your blood sugar (glucose) after you have not eaten for a while (fasting). You may have this done every 1-3 years.  Mammogram. This may be done every 1-2 years. Talk to your health care provider about when you should start having regular mammograms. This may depend on whether you have a family history of breast cancer.  BRCA-related cancer screening. This may be done if you have a family history of breast, ovarian, tubal, or peritoneal cancers.  Pelvic exam and Pap test. This  may be done every 3 years starting at age 33. Starting at age 2, this may be done every 5 years if you have a Pap test in combination with an HPV test.  Bone density scan. This is done to screen for osteoporosis. You  may have this scan if you are at high risk for osteoporosis. Discuss your test results, treatment options, and if necessary, the need for more tests with your health care provider. Vaccines  Your health care provider may recommend certain vaccines, such as:  Influenza vaccine. This is recommended every year.  Tetanus, diphtheria, and acellular pertussis (Tdap, Td) vaccine. You may need a Td booster every 10 years.  Zoster vaccine. You may need this after age 75.  Pneumococcal 13-valent conjugate (PCV13) vaccine. You may need this if you have certain conditions and were not previously vaccinated.  Pneumococcal polysaccharide (PPSV23) vaccine. You may need one or two doses if you smoke cigarettes or if you have certain conditions. Talk to your health care provider about which screenings and vaccines you need and how often you need them. This information is not intended to replace advice given to you by your health care provider. Make sure you discuss any questions you have with your health care provider. Document Released: 03/31/2015 Document Revised: 11/22/2015 Document Reviewed: 01/03/2015 Elsevier Interactive Patient Education  2017 [place] Prevention in the Home Falls can cause injuries. They can happen to people of all ages. There are many things you can do to make your home safe and to help prevent falls. What can I do on the outside of my home?  Regularly fix the edges of walkways and driveways and fix any cracks.  Remove anything that might make you trip as you walk through a door, such as a raised step or threshold.  Trim any bushes or trees on the path to your home.  Use bright outdoor lighting.  Clear any walking paths of anything that might make someone trip, such as rocks or tools.  Regularly check to see if handrails are loose or broken. Make sure that both sides of any steps have handrails.  Any raised decks and porches should have guardrails on the  edges.  Have any leaves, snow, or ice cleared regularly.  Use sand or salt on walking paths during winter.  Clean up any spills in your garage right away. This includes oil or grease spills. What can I do in the bathroom?  Use night lights.  Install grab bars by the toilet and in the tub and shower. Do not use towel bars as grab bars.  Use non-skid mats or decals in the tub or shower.  If you need to sit down in the shower, use a plastic, non-slip stool.  Keep the floor dry. Clean up any water that spills on the floor as soon as it happens.  Remove soap buildup in the tub or shower regularly.  Attach bath mats securely with double-sided non-slip rug tape.  Do not have throw rugs and other things on the floor that can make you trip. What can I do in the bedroom?  Use night lights.  Make sure that you have a light by your bed that is easy to reach.  Do not use any sheets or blankets that are too big for your bed. They should not hang down onto the floor.  Have a firm chair that has side arms. You can use this for support while you get dressed.  Do not have throw rugs and other things on the floor that can make you trip. What can I do in the kitchen?  Clean up any spills right away.  Avoid walking on wet floors.  Keep items that you use a lot in easy-to-reach places.  If you need to reach something above you, use a strong step stool that has a grab bar.  Keep electrical cords out of the way.  Do not use floor polish or wax that makes floors slippery. If you must use wax, use non-skid floor wax.  Do not have throw rugs and other things on the floor that can make you trip. What can I do with my stairs?  Do not leave any items on the stairs.  Make sure that there are handrails on both sides of the stairs and use them. Fix handrails that are broken or loose. Make sure that handrails are as long as the stairways.  Check any carpeting to make sure that it is firmly  attached to the stairs. Fix any carpet that is loose or worn.  Avoid having throw rugs at the top or bottom of the stairs. If you do have throw rugs, attach them to the floor with carpet tape.  Make sure that you have a light switch at the top of the stairs and the bottom of the stairs. If you do not have them, ask someone to add them for you. What else can I do to help prevent falls?  Wear shoes that:  Do not have high heels.  Have rubber bottoms.  Are comfortable and fit you well.  Are closed at the toe. Do not wear sandals.  If you use a stepladder:  Make sure that it is fully opened. Do not climb a closed stepladder.  Make sure that both sides of the stepladder are locked into place.  Ask someone to hold it for you, if possible.  Clearly mark and make sure that you can see:  Any grab bars or handrails.  First and last steps.  Where the edge of each step is.  Use tools that help you move around (mobility aids) if they are needed. These include:  Canes.  Walkers.  Scooters.  Crutches.  Turn on the lights when you go into a dark area. Replace any light bulbs as soon as they burn out.  Set up your furniture so you have a clear path. Avoid moving your furniture around.  If any of your floors are uneven, fix them.  If there are any pets around you, be aware of where they are.  Review your medicines with your doctor. Some medicines can make you feel dizzy. This can increase your chance of falling. Ask your doctor what other things that you can do to help prevent falls. This information is not intended to replace advice given to you by your health care provider. Make sure you discuss any questions you have with your health care provider. Document Released: 12/29/2008 Document Revised: 08/10/2015 Document Reviewed: 04/08/2014 Elsevier Interactive Patient Education  2017 [employer].

## 2018-09-08 NOTE — Progress Notes (Addendum)
Subjective:   Jessica Burgess is a 63 y.o. female who presents for Medicare Annual (Subsequent) preventive examination.  Review of Systems:   Cardiac Risk Factors include: hypertension;sedentary lifestyle     Objective:     Vitals: BP (!) 144/88 (BP Location: Left Arm, Patient Position: Sitting, Cuff Size: Normal)   Pulse 74   Temp 98.6 F (37 C) (Oral)   Resp 17   Ht 5\' 2"  (1.575 m)   Wt 160 lb 1.6 oz (72.6 kg)   SpO2 99%   BMI 29.28 kg/m   Body mass index is 29.28 kg/m.  Advanced Directives 09/08/2018 08/28/2018 08/28/2018 08/28/2018 08/17/2018 08/11/2018 08/03/2018  Does Patient Have a Medical Advance Directive? Yes Yes Yes Yes Yes Yes Yes  Type of Paramedic of [place];Living will Living will;Healthcare Power of [place];Living will Living will;Healthcare Power of [place]  Does patient want to make changes to medical advance directive? - - No - Patient declined No - Patient declined No - Patient declined No - Patient declined No - Patient declined  Copy of [place] in Chart? No - copy requested - No - copy requested No - copy requested No - copy requested No - copy requested No - copy requested  Would patient like information on creating a medical advance directive? - - No - Patient declined No - Patient declined No - Patient declined - No - Patient declined    Tobacco Social History   Tobacco Use  Smoking Status Former Smoker  . Packs/day: 1.50  . Years: 20.00  . Pack years: 30.00  . Types: Cigarettes  . Start date: 03/19/1979  . Quit date: 08/25/1999  . Years since quitting: 19.0  Smokeless Tobacco Never Used  Tobacco Comment   smoking cessation materials not required     Counseling given: Not Answered Comment: smoking cessation materials not required   Clinical Intake:  Pre-visit  preparation completed: Yes  Pain : 0-10 Pain Score: 4  Pain Type: Chronic pain Pain Location: Back Pain Orientation: Lower(sciatica pain) Pain Onset: More than a month ago Pain Frequency: Constant     BMI - recorded: 29.28 Nutritional Status: BMI 25 -29 Overweight Nutritional Risks: Nausea/ vomitting/ diarrhea(nausea on occaison) Diabetes: No  How often do you need to have someone help you when you read instructions, pamphlets, or other written materials from your doctor or pharmacy?: 1 - Never  Interpreter Needed?: No  Information entered by :: Clemetine Marker LPN  Past Medical History:  Diagnosis Date  . Allergic rhinitis, cause unspecified   . Anxiety state, unspecified   . Arthritis   . Asthma    only when sick   . Backache, unspecified   . Bronchitis    hx of when get sick  . Cancer ([place])    skin cancer , basal cell   . Cancer ([place]) 11/2016   ovarian  . Cervicalgia   . CHF (congestive heart failure) ([place])   . Complication of anesthesia   . Dermatophytosis of nail   . Dysmetabolic syndrome X   . Encounter for long-term (current) use of other medications   . Esophageal reflux   . Hypertension   . Insomnia, unspecified   . Leukocytosis, unspecified   . Migraine without aura, without mention of intractable migraine without mention of status migrainosus   . Other and unspecified hyperlipidemia   .  Other malaise and fatigue   . Overweight(278.02)   . Personal history of chemotherapy now   ovarian  . PONV (postoperative nausea and vomiting)   . Renal insufficiency   . Spinal stenosis in cervical region   . Symptomatic menopausal or female climacteric states   . Unspecified disorder of skin and subcutaneous tissue   . Unspecified vitamin D deficiency    Past Surgical History:  Procedure Laterality Date  . ABDOMINAL HYSTERECTOMY    . ANTERIOR CERVICAL DECOMP/DISCECTOMY FUSION N/A 06/07/2015   Procedure: Cervical three - four and Cervical six- seven anterior  cervical decompression with fusion interbody prosthesis plating and bonegraft;  Surgeon: Newman Pies, MD;  Location: [facility];  Service: Neurosurgery;  Laterality: N/A;  C34 and C67 anterior cervical decompression with fusion interbody prosthesis plating and bonegraft  . BACK SURGERY     x2 Lower   . DIALYSIS/PERMA CATHETER INSERTION N/A 08/17/2018   Procedure: DIALYSIS/PERMA CATHETER INSERTION;  Surgeon: Algernon Huxley, MD;  Location: [facility];  Service: Cardiovascular;  Laterality: N/A;  . EVACUATION OF CERVICAL HEMATOMA N/A 06/14/2015   Procedure: EVACUATION OF CERVICAL HEMATOMA;  Surgeon: Newman Pies, MD;  Location: [facility];  Service: Neurosurgery;  Laterality: N/A;  . NECK SURGERY     x3  . TONSILLECTOMY     Family History  Problem Relation Age of Onset  . Depression Mother   . Migraines Mother   . Dementia Father   . Diabetes Father   . Hyperlipidemia Father   . Hyperlipidemia Brother   . Hyperlipidemia Brother   . Breast cancer Paternal Aunt        60s   Social History   Socioeconomic History  . Marital status: Single    Spouse name: Not on file  . Number of children: 0  . Years of education: some college  . Highest education level: 12th grade  Occupational History    Employer: DISABLED  . Occupation: Disabled   Social Needs  . Financial resource strain: Somewhat hard  . Food insecurity    Worry: Never true    Inability: Never true  . Transportation needs    Medical: No    Non-medical: No  Tobacco Use  . Smoking status: Former Smoker    Packs/day: 1.50    Years: 20.00    Pack years: 30.00    Types: Cigarettes    Start date: 03/19/1979    Quit date: 08/25/1999    Years since quitting: 19.0  . Smokeless tobacco: Never Used  . Tobacco comment: smoking cessation materials not required  Substance and Sexual Activity  . Alcohol use: Not Currently    Alcohol/week: 0.0 standard drinks  . Drug use: No  . Sexual activity: Never  Lifestyle  .  Physical activity    Days per week: 0 days    Minutes per session: 0 min  . Stress: Very much  Relationships  . Social Herbalist on phone: Patient refused    Gets together: Patient refused    Attends religious service: Patient refused    Active member of club or organization: Patient refused    Attends meetings of clubs or organizations: Patient refused    Relationship status: Patient refused  Other Topics Concern  . Not on file  Social History Narrative   Patient is single.    Patient lives with roommates.    Patient on disability    Patient has no children.  Patient has some college     Outpatient Encounter Medications as of 09/08/2018  Medication Sig  . albuterol (PROVENTIL HFA;VENTOLIN HFA) 108 (90 Base) MCG/ACT inhaler Inhale 2 puffs into the lungs every 6 (six) hours as needed for wheezing or shortness of breath.  Marland Kitchen albuterol (PROVENTIL) (2.5 MG/3ML) 0.083% nebulizer solution Take 3 mLs (2.5 mg total) by nebulization every 6 (six) hours as needed for wheezing or shortness of breath.  . Ascorbic Acid (VITAMIN C PO) Take 1 tablet by mouth daily.   . Cholecalciferol (VITAMIN D-1000 MAX ST) 1000 UNITS tablet Take 1 tablet by mouth 2 (two) times daily.  . clonazePAM (KLONOPIN) 1 MG tablet Take 1 tablet (1 mg total) by mouth 2 (two) times daily. (Patient taking differently: Take 1 mg by mouth 2 (two) times daily. And takes a 3rd one if needed for anxiety)  . Cyanocobalamin (B-12 PO) Take 1 tablet by mouth daily.   . cyclobenzaprine (FLEXERIL) 10 MG tablet Take one tablet every 8 hours as needed  . DULoxetine (CYMBALTA) 30 MG capsule Take 1 capsule (30 mg total) by mouth daily.  . ferrous sulfate (IRON SUPPLEMENT) 325 (65 FE) MG tablet Take 1 tablet by mouth daily.  . fluticasone (FLONASE) 50 MCG/ACT nasal spray Place 2 sprays into both nostrils as needed.  . furosemide (LASIX) 80 MG tablet Take 1 tablet (80 mg total) by mouth 2 (two) times daily.  . hydrALAZINE  (APRESOLINE) 25 MG tablet Take 1 tablet (25 mg total) by mouth 3 (three) times daily.  . isosorbide mononitrate (IMDUR) 30 MG 24 hr tablet Take 1 tablet (30 mg total) by mouth daily.  Marland Kitchen levothyroxine (SYNTHROID) 25 MCG tablet Take 1 tablet (25 mcg total) by mouth daily before breakfast.  . lidocaine (XYLOCAINE) 2 % solution Use as directed 15 mLs in the mouth or throat as needed for mouth pain. (Patient taking differently: Use as directed 15 mLs in the mouth or throat as needed for mouth pain. Pt using topically for itching)  . Magnesium Oxide 400 (240 Mg) MG TABS Take 400 mg by mouth 2 (two) times daily.   . metoprolol succinate (TOPROL-XL) 50 MG 24 hr tablet Take 1 tablet (50 mg total) by mouth daily. Take with or immediately following a meal.  . mometasone-formoterol (DULERA) 200-5 MCG/ACT AERO Inhale 2 puffs into the lungs 2 (two) times daily.  . Multiple Vitamins-Minerals (MULTIVITAMIN PO) Take 1 tablet by mouth daily.   . Omega-3 Fatty Acids (FISH OIL PO) Take 1 capsule by mouth 2 (two) times daily.   . ondansetron (ZOFRAN-ODT) 8 MG disintegrating tablet Take 1 tablet (8 mg total) by mouth every 8 (eight) hours as needed for nausea or vomiting.  Marland Kitchen oxyCODONE-acetaminophen (PERCOCET) 10-325 MG tablet Take 1 tablet by mouth every 8 (eight) hours as needed for pain. (Patient taking differently: Take 1 tablet by mouth every 8 (eight) hours as needed for pain. Pt instructions on bottle q6h PRN)  . polyethylene glycol powder (GLYCOLAX/MIRALAX) 17 GM/SCOOP powder Take 17 g by mouth daily. PRN  . SUMAtriptan (IMITREX) 100 MG tablet May repeat in 2 hours if headache persists or recurs.  . budesonide-formoterol (SYMBICORT) 160-4.5 MCG/ACT inhaler Inhale 2 puffs into the lungs 2 (two) times daily.  Marland Kitchen ipratropium (ATROVENT HFA) 17 MCG/ACT inhaler Inhale 2 puffs into the lungs every 6 (six) hours as needed.   Marland Kitchen Lysine 1000 MG TABS Take 1 tablet by mouth daily.   . [DISCONTINUED] prochlorperazine (COMPAZINE)  10 MG tablet Take  1 tablet (10 mg total) by mouth every 6 (six) hours as needed (Nausea or vomiting).   Facility-Administered Encounter Medications as of 09/08/2018  Medication  . 0.9 %  sodium chloride infusion  . 0.9 %  sodium chloride infusion  . dexamethasone (DECADRON) 20 mg in sodium chloride 0.9 % 50 mL IVPB  . dexamethasone (DECADRON) injection 10 mg  . sodium chloride flush (NS) 0.9 % injection 10 mL    Activities of Daily Living In your present state of health, do you have any difficulty performing the following activities: 09/08/2018 08/28/2018  Hearing? N N  Comment declines hearing aids -  Vision? N Y  Comment wears glasses -  Difficulty concentrating or making decisions? N N  Walking or climbing stairs? N N  Dressing or bathing? N N  Doing errands, shopping? N N  Preparing Food and eating ? N -  Using the Toilet? N -  In the past six months, have you accidently leaked urine? N -  Do you have problems with loss of bowel control? N -  Managing your Medications? N -  Managing your Finances? N -  Housekeeping or managing your Housekeeping? N -  Some recent data might be hidden    Patient Care Team: Steele Sizer, MD as PCP - General (Family Medicine) Lloyd Huger, MD as Consulting Physician (Oncology) Mellody Drown, MD as Consulting Physician (Obstetrics and Gynecology) Cathi Roan, [facility] (Pharmacist) Benedetto Goad, RN as Case Manager Clent Jacks, RN as Registered Nurse    Assessment:   This is a routine wellness examination for [place].  Exercise Activities and Dietary recommendations Current Exercise Habits: The patient does not participate in regular exercise at present  Goals    . "I want to make sure I can afford Emgality" (pt-stated)     Clinical Goals: Minimize patient's chemotherapy side effects, including migraines and dental discomfort  Interventions: Apply to [employer] for [employer], refer to dentist    . diet      Poor appetite d/t oncological txt's. Recommend to add protein shakes during weeks she receives chemo.    Marland Kitchen DIET - INCREASE WATER INTAKE     Recommend to drink at least 6-8 8oz glasses of water per day.    Marland Kitchen DIET - Protein shakes     Pt undergoing chemo and states she has no appetite during her chemo weeks. Recommend to add a protein shake every day to her diet.       Fall Risk Fall Risk  09/08/2018 08/21/2018 03/12/2018 12/03/2017 10/28/2017  Falls in the past year? 0 0 0 Yes Yes  Comment - - - - -  Number falls in past yr: 0 0 0 2 or more 2 or more  Comment - - - - -  Injury with Fall? 0 0 0 - No  Risk for fall due to : - - - - -  Risk for fall due to: Comment - - - - -  Follow up Falls prevention discussed - - - -   FALL RISK PREVENTION PERTAINING TO THE HOME:  Any stairs in or around the home? Yes  If so, do they handrails? Yes   Home free of loose throw rugs in walkways, pet beds, electrical cords, etc? Yes  Adequate lighting in your home to reduce risk of falls? Yes   ASSISTIVE DEVICES UTILIZED TO PREVENT FALLS:  Life alert? No  Use of a cane, walker or w/c? No  Grab bars  in the bathroom? Yes  Shower chair or bench in shower? Yes  Elevated toilet seat or a handicapped toilet? Yes   DME ORDERS:  DME order needed?  No   TIMED UP AND GO:  Was the test performed? Yes .  Length of time to ambulate 10 feet: 7 sec.   GAIT:  Appearance of gait: Gait stead-fast and without the use of an assistive device.   Education: Fall risk prevention has been discussed.  Intervention(s) required? No   Depression Screen PHQ 2/9 Scores 09/08/2018 08/21/2018 06/10/2018 03/12/2018  PHQ - 2 Score 2 3 0 3  PHQ- 9 Score 11 11 0 6  Exception Documentation - - - -     Cognitive Function PT declines 6CIT for 2020 AWV     6CIT Screen 06/13/2017  What Year? 0 points  What month? 3 points  What time? 0 points  Count back from 20 0 points  Months in reverse 2 points  Repeat phrase 0  points  Total Score 5    Immunization History  Administered Date(s) Administered  . Hepatitis A 06/23/2012, 02/04/2013  . Influenza, Seasonal, Injecte, Preservative Fre 05/14/2012  . Influenza,inj,Quad PF,6+ Mos 11/25/2014, 02/02/2016, 11/12/2016, 12/03/2017  . Influenza-Unspecified 11/23/2013  . PPD Test 08/11/2018  . Pneumococcal Conjugate-13 11/12/2016  . Pneumococcal Polysaccharide-23 11/11/2011  . Tdap 11/11/2011    Qualifies for Shingles Vaccine? Yes . Due for Shingrix. Education has been provided regarding the importance of this vaccine. Pt has been advised to call insurance company to determine out of pocket expense. Advised may also receive vaccine at local pharmacy or Health Dept. Verbalized acceptance and understanding.  Tdap: Up to date  Flu Vaccine: Up to date  Pneumococcal Vaccine: Up to date   Screening Tests Health Maintenance  Topic Date Due  . MAMMOGRAM  03/12/2018  . INFLUENZA VACCINE  10/17/2018  . COLONOSCOPY  03/01/2019  . PAP SMEAR-Modifier  10/29/2019  . TETANUS/TDAP  11/10/2021  . Hepatitis C Screening  Completed  . HIV Screening  Completed   Cancer Screenings:  Colorectal Screening: Completed 02/28/14. Repeat every 5 years. Currently being seen by oncology, will wait for now.   Mammogram: Completed 03/12/17. Repeat every year. Ordered 03/12/18. Pt had chest CT last year.   Bone Density: due age 81  Lung Cancer Screening: (Low Dose CT Chest recommended if Age 55-80 years, 30 pack-year currently smoking OR have quit w/in 15years.) does not qualify. Chest CT done 08/28/18.   Additional Screening:  Hepatitis C Screening: does qualify; Completed 07/27/13  Vision Screening: Recommended annual ophthalmology exams for early detection of glaucoma and other disorders of the eye. Is the patient up to date with their annual eye exam?  No  Who is the provider or what is the name of the office in which the pt attends annual eye exams? Dr. Matilde Sprang    Dental Screening: Recommended annual dental exams for proper oral hygiene  Community Resource Referral:  CRR required this visit?  No '       Plan:     I have personally reviewed and addressed the Medicare Annual Wellness questionnaire and have noted the following in the patient's chart:  A. Medical and social history B. Use of alcohol, tobacco or illicit drugs  C. Current medications and supplements D. Functional ability and status E.  Nutritional status F.  Physical activity G. Advance directives H. List of other physicians I.  Hospitalizations, surgeries, and ER visits in previous 12 months J.  Vitals  K. Screenings such as hearing and vision if needed, cognitive and depression L. Referrals and appointments   In addition, I have reviewed and discussed with patient certain preventive protocols, quality metrics, and best practice recommendations. A written personalized care plan for preventive services as well as general preventive health recommendations were provided to patient.   Signed,  Clemetine Marker, LPN Nurse Health Advisor   Nurse Notes:pt c/o itching all over her body and does not know why. She states it could be from chemo treatments or dialysis. She uses mild baby lotion for moisturizer and today she began using lidocaine 2% oral solution topically for itch relief. Pt has already been referred to [facility] team for CHF. She had a few of her medications confused. She thought the hydralazine TID was xanax but she is already taking clonazepam 1 mg BID that she states does not work and she also feels like she needs something for sleep as she used to be on temazepam. She is also using dulera 200 2 puffs BID but had symbicort on med list and needs clarification for which she needs to be on. Pt is also worried about hospital bills from recent stays and advised to contact us for [facility] referral if needed for financial assistance. Pt has follow up with oncology this week and scheduled to see  Dr. Ancil Boozer on 09/16/18. Encouraged to call with any questions or concerns.   She needs to either take Dulera of Symbicort, not both She cannot take clonazepam and temazepam ( same class of drugs ) needs to choose which one she prefers Itching is likely from kidney failure, very common.

## 2018-09-11 ENCOUNTER — Other Ambulatory Visit: Payer: Self-pay

## 2018-09-11 ENCOUNTER — Encounter: Payer: Self-pay | Admitting: Oncology

## 2018-09-11 ENCOUNTER — Inpatient Hospital Stay (HOSPITAL_BASED_OUTPATIENT_CLINIC_OR_DEPARTMENT_OTHER): Payer: PPO | Admitting: Hospice and Palliative Medicine

## 2018-09-11 ENCOUNTER — Inpatient Hospital Stay (HOSPITAL_BASED_OUTPATIENT_CLINIC_OR_DEPARTMENT_OTHER): Payer: PPO | Admitting: Oncology

## 2018-09-11 DIAGNOSIS — C569 Malignant neoplasm of unspecified ovary: Secondary | ICD-10-CM

## 2018-09-11 DIAGNOSIS — Z515 Encounter for palliative care: Secondary | ICD-10-CM

## 2018-09-11 NOTE — Progress Notes (Signed)
Spoke with patient and sister.  Pt reports appetite improved some.  Pt states run out of klonopin and request a refill.  Dr Grayland Ormond and [place] notified.  Pt has dialysis appt today @ 315p.

## 2018-09-11 NOTE — Progress Notes (Signed)
Virtual Visit via Telephone Note  I connected with Jessica Burgess on 09/11/18 at  2:00 PM EDT by telephone and verified that I am speaking with the correct person using two identifiers.   I discussed the limitations, risks, security and privacy concerns of performing an evaluation and management service by telephone and the availability of in person appointments. I also discussed with the patient that there may be a patient responsible charge related to this service. The patient expressed understanding and agreed to proceed.   History of Present Illness: Palliative Care consult requested for this 63 y.o. female with multiple medical problems including stage IIIc high-grade serous ovarian carcinoma.  She is status post debulking surgery at [facility] in 2018 and received one preoperative dose of Keytruda as part of a clinical trial.  Patient completed adjuvant carboplatinum and Taxol, which was discontinued secondary to worsening neuropathy.  Patient was also tried on Avastin and doxorubicin due to disease progression but was ultimately switched to gemcitabine secondary to declining performance status.  She has been on treatment with gemcitabine since December 2019.    PMH is also notable for ESRD recently started on dialysis and severe diastolic dysfunction with recurrent CHF.  Patient was recently hospitalized 08/28/2018 to 08/30/2018 with CHF.  Patient was referred to palliative care to help address symptoms and goals.    Observations/Objective: Today I called and spoke with patient and her sister.  Since returning home from the hospital patient says that she is doing reasonably well.  She is functionally at her baseline and able to provide for her own care in the home.  She had home health services at time of discharge but no longer needs their care.  Symptomatically, patient denies pain or shortness of breath.  She says that she has had ongoing pruritus associated with her hemodialysis.  She has mentioned  this to her nephrology team.  She has been treated with hydrocortisone cream.  I recommended that she mention it at the time of next dialysis session as it may be that dose modification of her hemodialysis would be warranted.  Alternatively, patient might benefit from trial of hydroxyzine or gabapentin.  Patient says that her goals are aligned with continued treatment for the cancer.  However, she wants to speak with Dr. Grayland Ormond for more details regarding her prognosis in the setting of rising tumor markers.  Patient clearly recognizes that she has multiple comorbidities that are severe in nature and might ultimately prove life limiting.  We discussed CODE STATUS.  Patient says that she needs to discuss that in more depth with her family.  She has previously completed a healthcare power of attorney, which is her sister Jessica Burgess.  She does not have a living will.  I will mail her a MOST Form for her to review.  Assessment and Plan: -Continue conversations regarding goals -Consider trial of hydroxyzine or gabapentin for presumed uremic pruritus.  However, patient will for speak with her nephrology team. -MOST Form mailed to house  Follow Up Instructions: RTC in 2 weeks   I discussed the assessment and treatment plan with the patient. The patient was provided an opportunity to ask questions and all were answered. The patient agreed with the plan and demonstrated an understanding of the instructions.   The patient was advised to call back or seek an in-person evaluation if the symptoms worsen or if the condition fails to improve as anticipated.  I provided 25 minutes of non-face-to-face time during this encounter.  Irean Hong, NP

## 2018-09-15 DIAGNOSIS — N186 End stage renal disease: Secondary | ICD-10-CM | POA: Diagnosis not present

## 2018-09-15 DIAGNOSIS — Z992 Dependence on renal dialysis: Secondary | ICD-10-CM | POA: Diagnosis not present

## 2018-09-16 ENCOUNTER — Ambulatory Visit (INDEPENDENT_AMBULATORY_CARE_PROVIDER_SITE_OTHER): Payer: PPO | Admitting: Family Medicine

## 2018-09-16 ENCOUNTER — Encounter: Payer: Self-pay | Admitting: Family Medicine

## 2018-09-16 ENCOUNTER — Telehealth: Payer: Self-pay

## 2018-09-16 VITALS — BP 160/88 | HR 88 | Temp 97.1°F | Resp 16 | Ht 62.0 in | Wt 158.5 lb

## 2018-09-16 DIAGNOSIS — G4700 Insomnia, unspecified: Secondary | ICD-10-CM

## 2018-09-16 DIAGNOSIS — Z992 Dependence on renal dialysis: Secondary | ICD-10-CM | POA: Diagnosis not present

## 2018-09-16 DIAGNOSIS — L298 Other pruritus: Secondary | ICD-10-CM | POA: Diagnosis not present

## 2018-09-16 DIAGNOSIS — G43009 Migraine without aura, not intractable, without status migrainosus: Secondary | ICD-10-CM

## 2018-09-16 DIAGNOSIS — F329 Major depressive disorder, single episode, unspecified: Secondary | ICD-10-CM | POA: Diagnosis not present

## 2018-09-16 DIAGNOSIS — C800 Disseminated malignant neoplasm, unspecified: Secondary | ICD-10-CM

## 2018-09-16 DIAGNOSIS — L299 Pruritus, unspecified: Secondary | ICD-10-CM

## 2018-09-16 DIAGNOSIS — Z85828 Personal history of other malignant neoplasm of skin: Secondary | ICD-10-CM | POA: Diagnosis not present

## 2018-09-16 DIAGNOSIS — Z08 Encounter for follow-up examination after completed treatment for malignant neoplasm: Secondary | ICD-10-CM | POA: Diagnosis not present

## 2018-09-16 MED ORDER — SUMATRIPTAN SUCCINATE 100 MG PO TABS: ORAL_TABLET | ORAL | 1 refills | Status: DC

## 2018-09-16 MED ORDER — TEMAZEPAM 30 MG PO CAPS: 30.0000 mg | ORAL_CAPSULE | Freq: Every evening | ORAL | 0 refills | Status: DC | PRN

## 2018-09-16 MED ORDER — HYDROXYZINE HCL 10 MG PO TABS: 10.0000 mg | ORAL_TABLET | Freq: Four times a day (QID) | ORAL | 0 refills | Status: DC | PRN

## 2018-09-16 MED ORDER — DULOXETINE HCL 60 MG PO CPEP: 60.0000 mg | ORAL_CAPSULE | Freq: Every day | ORAL | 0 refills | Status: DC

## 2018-09-16 NOTE — Telephone Encounter (Signed)
TELEPHONE CALL NOTE  Jessica Burgess has been deemed a candidate for a follow-up tele-health visit to limit community exposure during the Covid-19 pandemic. I spoke with the patient via phone to ensure availability of phone/video source, confirm preferred email & phone number, discuss instructions and expectations, and review consent.   I reminded Jessica Burgess to be prepared with any vital sign and/or heart rhythm information that could potentially be obtained via home monitoring, at the time of her visit.  Finally, I reminded Jessica Burgess to expect an e-mail containing a link for their video-based visit approximately 15 minutes before her visit, or alternatively, a phone call at the time of her visit if her visit is planned to be a phone encounter.  Did the patient verbally consent to treatment as below? YES  ALLRED, AMANDA L, CMA 09/16/2018 2:01 PM  CONSENT FOR TELE-HEALTH VISIT - PLEASE REVIEW  I hereby voluntarily request, consent and authorize [facility] and its employed or contracted physicians, physician assistants, nurse practitioners or other licensed health care professionals (the Practitioner), to provide me with telemedicine health care services (the "Services") as deemed necessary by the treating Practitioner. I acknowledge and consent to receive the Services by the Practitioner via telemedicine. I understand that the telemedicine visit will involve communicating with the Practitioner through telephonic communication technology and the disclosure of certain medical information by electronic transmission. I acknowledge that I have been given the opportunity to request an in-person assessment or other available alternative prior to the telemedicine visit and am voluntarily participating in the telemedicine visit.  I understand that I have the right to withhold or withdraw my consent to the use of telemedicine in the course of my care at any time, without affecting my  right to future care or treatment, and that the Practitioner or I may terminate the telemedicine visit at any time. I understand that I have the right to inspect all information obtained and/or recorded in the course of the telemedicine visit and may receive copies of available information for a reasonable fee.  I understand that some of the potential risks of receiving the Services via telemedicine include:  Marland Kitchen Delay or interruption in medical evaluation due to technological equipment failure or disruption; . Information transmitted may not be sufficient (e.g. poor resolution of images) to allow for appropriate medical decision making by the Practitioner; and/or  . In rare instances, security protocols could fail, causing a breach of personal health information.  Furthermore, I acknowledge that it is my responsibility to provide information about my medical history, conditions and care that is complete and accurate to the best of my ability. I acknowledge that Practitioner's advice, recommendations, and/or decision may be based on factors not within their control, such as incomplete or inaccurate data provided by me or lack of visual representation. I understand that the practice of medicine is not an exact science and that Practitioner makes no warranties or guarantees regarding treatment outcomes. I acknowledge that I will receive a copy of this consent concurrently upon execution via email to the email address I last provided but may also request a printed copy by calling the office of [facility].    I understand that my insurance may be billed for this visit.   I have read or had this consent read to me. . I understand the contents of this consent, which adequately explains the benefits and risks of the Services being provided via telemedicine.  Marland Kitchen  I have been provided ample opportunity to ask questions regarding this consent and the Services and have had my questions answered to my  satisfaction. . I give my informed consent for the services to be provided through the use of telemedicine in my medical care  By participating in this telemedicine visit I agree to the above.

## 2018-09-16 NOTE — Progress Notes (Signed)
Name: Jessica Burgess   MRN: [id number]    DOB: 07-Mar-1956   Date:09/16/2018       Progress Note  Subjective  Chief Complaint  Chief Complaint  Patient presents with  . Depression  . Hypothyroidism  . Headache  . Congestive Heart Failure    HPI  MDD/Anxiety: she has a long history of depression, but much worse recently. She is also more anxious. She is off chemo secondary to Acute renal failure and HD and her CA 125 is very high. She has talked to palliative care but is still full code. She is very scared of dying but also afraid of living in her current state. 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 She is taking Duloxetine given by [facility] during her last hospital stay , currently on 30 mg and we will increase to 60 mg today. We will also add hydroxyzine to take for itching but explained it may also help with anxiety  Generalized pruritis: she was seen by Dr. Evorn Gong ( Dermatologist) and he states it may be from cancer or HD, not originating in her skin. We will try hydroxyzine  ESRD on HD: started during hospital stay 07/2018, she has HD three times a week T,T and Saturdays. She still has urine output, and per patient seems to be improving, up to about 3 times daily and larger volume   Migraine headaches: getting worse again, episodes almost daily, she need imitrex refill and we will also try Nurtec but not sure if covered by insurance   CHF: she continues to have SOB and lower extremity edema and orthopnea   Insomnia: she is able to sleep on Temazepam but unable to stay asleep  Patient Active Problem List   Diagnosis Date Noted  . CHF (congestive heart failure) ([place]) 08/28/2018  . Acute respiratory failure ([place])   . Anxiety 08/14/2018  . ESRD on dialysis ([place])   . Acute CHF (congestive heart failure) ([place]) 08/07/2018  . Acute CHF ([place]) 08/06/2018  . 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 Mucositis due to chemotherapy 09/02/2017  . Goals of care,  counseling/discussion 08/08/2017  . Hypothyroidism due to medication 07/28/2017  . Genetic testing 12/18/2016  . Migraines 12/01/2016  . Postoperative seroma of subcutaneous tissue after non-dermatologic procedure 12/01/2016  . Transaminitis 12/01/2016  . Anemia associated with acute blood loss 11/20/2016  . Ovarian cancer, unspecified laterality ([place]) 11/12/2016  . 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 Examination of participant in clinical trial 11/01/2016  . Chronic right-sided low back pain with right-sided sciatica 02/02/2016  . Status post insertion of dialysis catheter ([place]) 08/01/2015  . Hematoma 06/14/2015  . Perennial allergic rhinitis 05/05/2015  . Generalized anxiety disorder 02/03/2015  . Back pain, chronic 08/25/2014  . Insomnia, persistent 08/25/2014  . Chronic cervical pain 08/25/2014  . Major depression, chronic ([place]) 08/25/2014  . Dyslipidemia 08/25/2014  . Gastro-esophageal reflux disease without esophagitis 08/25/2014  . H/O high risk medication treatment 08/25/2014  . Blood glucose elevated 08/25/2014  . Migraine without aura and without status migrainosus, not intractable 08/25/2014  . Climacteric 08/25/2014  . Dysmetabolic syndrome 63/87/5643  . Fungal infection of toenail 08/25/2014  . Obesity (BMI 30.0-34.9) 08/25/2014  . Vitamin D deficiency 08/25/2014  . Engages in travel abroad 08/25/2014  . Cervical disc disorder with radiculopathy 04/28/2013    Past Surgical History:  Procedure Laterality Date  . ABDOMINAL HYSTERECTOMY    . ANTERIOR CERVICAL DECOMP/DISCECTOMY FUSION  N/A 06/07/2015   Procedure: Cervical three - four and Cervical six- seven anterior cervical decompression with fusion interbody prosthesis plating and bonegraft;  Surgeon: Newman Pies, MD;  Location: [facility];  Service: Neurosurgery;  Laterality: N/A;  C34 and C67 anterior cervical decompression with fusion interbody prosthesis plating and bonegraft   . BACK SURGERY     x2 Lower   . DIALYSIS/PERMA CATHETER INSERTION N/A 08/17/2018   Procedure: DIALYSIS/PERMA CATHETER INSERTION;  Surgeon: Algernon Huxley, MD;  Location: [facility];  Service: Cardiovascular;  Laterality: N/A;  . EVACUATION OF CERVICAL HEMATOMA N/A 06/14/2015   Procedure: EVACUATION OF CERVICAL HEMATOMA;  Surgeon: Newman Pies, MD;  Location: [facility];  Service: Neurosurgery;  Laterality: N/A;  . NECK SURGERY     x3  . TONSILLECTOMY      Family History  Problem Relation Age of Onset  . Depression Mother   . Migraines Mother   . Dementia Father   . Diabetes Father   . Hyperlipidemia Father   . Hyperlipidemia Brother   . Hyperlipidemia Brother   . Breast cancer Paternal Aunt        51s    Social History   Socioeconomic History  . Marital status: Single    Spouse name: Not on file  . Number of children: 0  . Years of education: some college  . Highest education level: 12th grade  Occupational History    Employer: DISABLED  . Occupation: Disabled   Social Needs  . Financial resource strain: Somewhat hard  . Food insecurity    Worry: Never true    Inability: Never true  . Transportation needs    Medical: No    Non-medical: No  Tobacco Use  . Smoking status: Former Smoker    Packs/day: 1.50    Years: 20.00    Pack years: 30.00    Types: Cigarettes    Start date: 03/19/1979    Quit date: 08/25/1999    Years since quitting: 19.0  . Smokeless tobacco: Never Used  . Tobacco comment: smoking cessation materials not required  Substance and Sexual Activity  . Alcohol use: Not Currently    Alcohol/week: 0.0 standard drinks  . Drug use: No  . Sexual activity: Never  Lifestyle  . Physical activity    Days per week: 0 days    Minutes per session: 0 min  . Stress: Very much  Relationships  . Social Herbalist on phone: Patient refused    Gets together: Patient refused    Attends religious service: Patient refused    Active member  of club or organization: Patient refused    Attends meetings of clubs or organizations: Patient refused    Relationship status: Patient refused  . Intimate partner violence    Fear of current or ex partner: No    Emotionally abused: No    Physically abused: No    Forced sexual activity: No  Other Topics Concern  . Not on file  Social History Narrative   Patient is single.    Patient lives with roommates.    Patient on disability    Patient has no children.    Patient has some college      Current Outpatient Medications:  .  albuterol (PROVENTIL HFA;VENTOLIN HFA) 108 (90 Base) MCG/ACT inhaler, Inhale 2 puffs into the lungs every 6 (six) hours as needed for wheezing or shortness of breath., Disp: 1 Inhaler, Rfl: 2 .  albuterol (PROVENTIL) (  2.5 MG/3ML) 0.083% nebulizer solution, Take 3 mLs (2.5 mg total) by nebulization every 6 (six) hours as needed for wheezing or shortness of breath., Disp: 75 mL, Rfl: 2 .  Ascorbic Acid (VITAMIN C PO), Take 1 tablet by mouth daily. , Disp: , Rfl:  .  Cholecalciferol (VITAMIN D-1000 MAX ST) 1000 UNITS tablet, Take 1 tablet by mouth 2 (two) times daily., Disp: , Rfl:  .  Cyanocobalamin (B-12 PO), Take 1 tablet by mouth daily. , Disp: , Rfl:  .  cyclobenzaprine (FLEXERIL) 10 MG tablet, Take one tablet every 8 hours as needed, Disp: 30 tablet, Rfl: 3 .  ferrous sulfate (IRON SUPPLEMENT) 325 (65 FE) MG tablet, Take 1 tablet by mouth daily., Disp: , Rfl:  .  furosemide (LASIX) 80 MG tablet, Take 1 tablet (80 mg total) by mouth 2 (two) times daily., Disp: 180 tablet, Rfl: 3 .  hydrALAZINE (APRESOLINE) 25 MG tablet, Take 1 tablet (25 mg total) by mouth 3 (three) times daily., Disp: 90 tablet, Rfl: 1 .  isosorbide mononitrate (IMDUR) 30 MG 24 hr tablet, Take 1 tablet (30 mg total) by mouth daily., Disp: 30 tablet, Rfl: 2 .  levothyroxine (SYNTHROID) 25 MCG tablet, Take 1 tablet (25 mcg total) by mouth daily before breakfast., Disp: 30 tablet, Rfl: 2 .   lidocaine (XYLOCAINE) 2 % solution, Use as directed 15 mLs in the mouth or throat as needed for mouth pain. (Patient taking differently: Use as directed 15 mLs in the mouth or throat as needed for mouth pain. Pt using topically for itching), Disp: 100 mL, Rfl: 0 .  Lysine 1000 MG TABS, Take 1 tablet by mouth daily. , Disp: , Rfl:  .  Magnesium Oxide 400 (240 Mg) MG TABS, Take 400 mg by mouth 2 (two) times daily. , Disp: , Rfl:  .  metoprolol succinate (TOPROL-XL) 50 MG 24 hr tablet, Take 1 tablet (50 mg total) by mouth daily. Take with or immediately following a meal., Disp: 30 tablet, Rfl: 2 .  mometasone-formoterol (DULERA) 200-5 MCG/ACT AERO, Inhale 2 puffs into the lungs 2 (two) times daily., Disp: , Rfl:  .  Multiple Vitamins-Minerals (MULTIVITAMIN PO), Take 1 tablet by mouth daily. , Disp: , Rfl:  .  Omega-3 Fatty Acids (FISH OIL PO), Take 1 capsule by mouth 2 (two) times daily. , Disp: , Rfl:  .  ondansetron (ZOFRAN-ODT) 8 MG disintegrating tablet, Take 1 tablet (8 mg total) by mouth every 8 (eight) hours as needed for nausea or vomiting., Disp: 60 tablet, Rfl: 3 .  oxyCODONE-acetaminophen (PERCOCET) 10-325 MG tablet, Take 1 tablet by mouth every 8 (eight) hours as needed for pain. (Patient taking differently: Take 1 tablet by mouth every 8 (eight) hours as needed for pain. Pt instructions on bottle q6h PRN), Disp: 20 tablet, Rfl: 0 .  polyethylene glycol powder (GLYCOLAX/MIRALAX) 17 GM/SCOOP powder, Take 17 g by mouth daily. PRN, Disp: , Rfl:  .  SUMAtriptan (IMITREX) 100 MG tablet, May repeat in 2 hours if headache persists or recurs., Disp: 9 tablet, Rfl: 1 .  DULoxetine (CYMBALTA) 60 MG capsule, Take 1 capsule (60 mg total) by mouth daily., Disp: 90 capsule, Rfl: 0 .  fluticasone (FLONASE) 50 MCG/ACT nasal spray, Place 2 sprays into both nostrils as needed., Disp: 48 g, Rfl: 1 .  hydrOXYzine (ATARAX/VISTARIL) 10 MG tablet, Take 1 tablet (10 mg total) by mouth every 6 (six) hours as needed.,  Disp: 120 tablet, Rfl: 0 .  ipratropium (ATROVENT HFA) 17  MCG/ACT inhaler, Inhale 2 puffs into the lungs every 6 (six) hours as needed. , Disp: , Rfl:  .  temazepam (RESTORIL) 30 MG capsule, Take 1 capsule (30 mg total) by mouth at bedtime as needed for sleep., Disp: 30 capsule, Rfl: 0 No current facility-administered medications for this visit.   Facility-Administered Medications Ordered in Other Visits:  .  0.9 %  sodium chloride infusion, , Intravenous, Once, Finnegan, Kathlene November, MD .  0.9 %  sodium chloride infusion, , Intravenous, Once, Grayland Ormond, Kathlene November, MD .  dexamethasone (DECADRON) 20 mg in sodium chloride 0.9 % 50 mL IVPB, 20 mg, Intravenous, Once, Grayland Ormond, Kathlene November, MD .  dexamethasone (DECADRON) injection 10 mg, 10 mg, Intravenous, Once, Finnegan, Kathlene November, MD .  sodium chloride flush (NS) 0.9 % injection 10 mL, 10 mL, Intravenous, PRN, Lloyd Huger, MD, 10 mL at 06/16/17 0854  Allergies  Allergen Reactions  . Chlorhexidine Itching    Pt reports itching this AM post wipes w/ CHG, no redness or irritation assessed    I personally reviewed active problem list, medication list, allergies, family history, social history with the patient/caregiver today.   ROS  Ten systems reviewed and is negative except as mentioned in HPI   Objective  Vitals:   09/16/18 1505  BP: (!) 160/90  Pulse: 88  Resp: 16  Temp: (!) 97.1 F (36.2 C)  TempSrc: Oral  SpO2: 93%  Weight: 158 lb 8 oz (71.9 kg)  Height: 5\' 2"  (1.575 m)    Body mass index is 28.99 kg/m.  Physical Exam  Constitutional: Patient appears well-developed and well-nourished. Overweight.  Mild respiratory distress  HEENT: head atraumatic, normocephalic, pupils equal and reactive to light,  neck supple, oral mucosa not done  Cardiovascular: Normal rate, regular rhythm and normal heart sounds.  No murmur heard. 1 plus pitting  BLE edema. Pulmonary/Chest: Effort normal and breath sounds normal. No respiratory  distress. Abdominal: Soft.  There is no tenderness. Skin: some irritation of anterior chest wall because of itching Psychiatric: Patient has a normal mood and affect. behavior is normal. Judgment and thought content normal.   PHQ2/9: Depression screen [facility] 2/9 09/16/2018 09/16/2018 09/08/2018 08/21/2018 06/10/2018  Decreased Interest 0 0 1 2 0  Down, Depressed, Hopeless 1 0 1 1 0  PHQ - 2 Score 1 0 2 3 0  Altered sleeping 3 0 3 3 0  Tired, decreased energy 3 0 3 1 0  Change in appetite 0 0 2 1 0  Feeling bad or failure about yourself  1 0 0 2 0  Trouble concentrating 1 0 1 1 0  Moving slowly or fidgety/restless 0 0 0 0 0  Suicidal thoughts 0 0 0 0 0  PHQ-9 Score 9 0 11 11 0  Difficult doing work/chores Not difficult at all - Somewhat difficult - -  Some recent data might be hidden    phq 9 is positive   Fall Risk: Fall Risk  09/16/2018 09/08/2018 08/21/2018 03/12/2018 12/03/2017  Falls in the past year? 0 0 0 0 Yes  Comment - - - - -  Number falls in past yr: 0 0 0 0 2 or more  Comment - - - - -  Injury with Fall? 0 0 0 0 -  Risk for fall due to : - - - - -  Risk for fall due to: Comment - - - - -  Follow up - Falls prevention discussed - - -  Functional Status Survey: Is the patient deaf or have difficulty hearing?: No Does the patient have difficulty seeing, even when wearing glasses/contacts?: No Does the patient have difficulty concentrating, remembering, or making decisions?: No Does the patient have difficulty walking or climbing stairs?: Yes Does the patient have difficulty dressing or bathing?: No Does the patient have difficulty doing errands alone such as visiting a doctor's office or shopping?: No    Assessment & Plan  1. Migraine without aura and without status migrainosus, not intractable  - SUMAtriptan (IMITREX) 100 MG tablet; May repeat in 2 hours if headache persists or recurs.  Dispense: 9 tablet; Refill: 1  2. Hemodialysis patient [facility])  She is compliant    3. Major depression, chronic (HCC)  - DULoxetine (CYMBALTA) 60 MG capsule; Take 1 capsule (60 mg total) by mouth daily.  Dispense: 90 capsule; Refill: 0  4. Generalized pruritus  - hydrOXYzine (ATARAX/VISTARIL) 10 MG tablet; Take 1 tablet (10 mg total) by mouth every 6 (six) hours as needed.  Dispense: 120 tablet; Refill: 0  5. Insomnia, persistent  - temazepam (RESTORIL) 30 MG capsule; Take 1 capsule (30 mg total) by mouth at bedtime as needed for sleep.  Dispense: 30 capsule; Refill: 0  6. Carcinomatosis ([place])  Off medication / chemo at this time

## 2018-09-17 ENCOUNTER — Ambulatory Visit: Payer: PPO | Attending: Family | Admitting: Family

## 2018-09-17 ENCOUNTER — Ambulatory Visit: Payer: Self-pay | Admitting: Pharmacist

## 2018-09-17 ENCOUNTER — Encounter: Payer: Self-pay | Admitting: Family

## 2018-09-17 ENCOUNTER — Other Ambulatory Visit: Payer: Self-pay

## 2018-09-17 VITALS — Wt 154.4 lb

## 2018-09-17 DIAGNOSIS — D509 Iron deficiency anemia, unspecified: Secondary | ICD-10-CM | POA: Diagnosis not present

## 2018-09-17 DIAGNOSIS — I5022 Chronic systolic (congestive) heart failure: Secondary | ICD-10-CM

## 2018-09-17 DIAGNOSIS — Z992 Dependence on renal dialysis: Secondary | ICD-10-CM

## 2018-09-17 DIAGNOSIS — N186 End stage renal disease: Secondary | ICD-10-CM | POA: Diagnosis not present

## 2018-09-17 DIAGNOSIS — N184 Chronic kidney disease, stage 4 (severe): Secondary | ICD-10-CM | POA: Diagnosis not present

## 2018-09-17 DIAGNOSIS — N2581 Secondary hyperparathyroidism of renal origin: Secondary | ICD-10-CM | POA: Diagnosis not present

## 2018-09-17 DIAGNOSIS — F329 Major depressive disorder, single episode, unspecified: Secondary | ICD-10-CM

## 2018-09-17 DIAGNOSIS — D631 Anemia in chronic kidney disease: Secondary | ICD-10-CM | POA: Diagnosis not present

## 2018-09-17 DIAGNOSIS — I890 Lymphedema, not elsewhere classified: Secondary | ICD-10-CM

## 2018-09-17 DIAGNOSIS — N179 Acute kidney failure, unspecified: Secondary | ICD-10-CM | POA: Diagnosis not present

## 2018-09-17 NOTE — Chronic Care Management (AMB) (Signed)
  Chronic Care Management   Note  09/17/2018 Name: Jessica Burgess MRN: [id number] DOB: 10-23-1955  Jessica Burgess is a 63 y.o. year old female who sees Steele Sizer, MD for primary care. Patient was referred to embedded CCM team by an EMMI red flag. Telephone outreach to patient today to re-introduce CCM services.  Plan: Patient has previously agreed to services and verbal consent obtained.      Follow up : Patient provided CCM contact information should she have any needs in the future  Ruben Reason, PharmD Clinical Pharmacist [place] [phone]

## 2018-09-17 NOTE — Patient Instructions (Signed)
  Thank you allowing the Chronic Care Management Team to be a part of your care!   Please call a member of the CCM (Chronic Care Management) Team with any questions or case management needs:   Portia Payne, Rn, BSN Nurse Care Coordinator  [phone]  Kensie Susman, PharmD  Clinical Pharmacist  [phone]  Chrystal Land, LCSW Clinical Social Worker [phone]  

## 2018-09-17 NOTE — Progress Notes (Signed)
Virtual Visit via Telephone Note   Evaluation Performed:  Follow-up visit  This visit type was conducted due to national recommendations for restrictions regarding the COVID-19 Pandemic (e.g. social distancing).  This format is felt to be most appropriate for this patient at this time.  All issues noted in this document were discussed and addressed.  No physical exam was performed (except for noted visual exam findings with Video Visits).  Please refer to the patient's chart (MyChart message for video visits and phone note for telephone visits) for the patient's consent to telehealth for [facility]  Date:  09/17/2018   ID:  Jessica Burgess, DOB 1956/02/05, MRN [id number]  Patient Location:  [place] [address]   Provider location:   [facility] [place] [address], [place] [address]  PCP:  Steele Sizer, MD  Cardiologist:  Nelva Bush, MD Electrophysiologist:  None   Chief Complaint:  Shortness of breath  History of Present Illness:    Jessica Burgess is a 63 y.o. female who presents via audio/video conferencing for a telehealth visit today.  Patient verified DOB and address.  The patient does not have symptoms concerning for COVID-19 infection (fever, chills, cough, or new SHORTNESS OF BREATH).   Patient reports moderate shortness of breath upon minimal exertion. She says that this has been present for several months. She has associated dizziness (improving), swelling around ankles (improving), dry cough and fatigue along with this. She denies any palpitations, chest pain or weight gain. Is now wearing compression socks which has improved the swelling in her legs. Continues to have dialysis three days/ week.   Prior CV studies:   The following studies were reviewed today:  Echo report from 08/28/2018 reviewed and showed an EF of 50-55% along with moderate TR, mild/moderate AR, moderate MR and an elevated PA  pressure of 73.7 mmHg.   Past Medical History:  Diagnosis Date  . Allergic rhinitis, cause unspecified   . Anxiety state, unspecified   . Arthritis   . Asthma    only when sick   . Backache, unspecified   . Bronchitis    hx of when get sick  . Cancer ([place])    skin cancer , basal cell   . Cancer ([place]) 11/2016   ovarian  . Cervicalgia   . CHF (congestive heart failure) ([place])   . Complication of anesthesia   . Dermatophytosis of nail   . Dysmetabolic syndrome X   . Encounter for long-term (current) use of other medications   . Esophageal reflux   . Hypertension   . Insomnia, unspecified   . Leukocytosis, unspecified   . Migraine without aura, without mention of intractable migraine without mention of status migrainosus   . Other and unspecified hyperlipidemia   . Other malaise and fatigue   . Overweight(278.02)   . Personal history of chemotherapy now   ovarian  . PONV (postoperative nausea and vomiting)   . Renal insufficiency   . Spinal stenosis in cervical region   . Symptomatic menopausal or female climacteric states   . Unspecified disorder of skin and subcutaneous tissue   . Unspecified vitamin D deficiency    Past Surgical History:  Procedure Laterality Date  . ABDOMINAL HYSTERECTOMY    . ANTERIOR CERVICAL DECOMP/DISCECTOMY FUSION N/A 06/07/2015   Procedure: Cervical three - four and Cervical six- seven anterior cervical decompression with fusion interbody prosthesis plating and bonegraft;  Surgeon: Newman Pies, MD;  Location: [facility];  Service: Neurosurgery;  Laterality: N/A;  C34 and C67 anterior cervical decompression with fusion interbody prosthesis plating and bonegraft  . BACK SURGERY     x2 Lower   . DIALYSIS/PERMA CATHETER INSERTION N/A 08/17/2018   Procedure: DIALYSIS/PERMA CATHETER INSERTION;  Surgeon: Algernon Huxley, MD;  Location: [facility];  Service: Cardiovascular;  Laterality: N/A;  . EVACUATION OF CERVICAL HEMATOMA N/A 06/14/2015    Procedure: EVACUATION OF CERVICAL HEMATOMA;  Surgeon: Newman Pies, MD;  Location: [facility];  Service: Neurosurgery;  Laterality: N/A;  . NECK SURGERY     x3  . TONSILLECTOMY       Current Meds  Medication Sig  . albuterol (PROVENTIL HFA;VENTOLIN HFA) 108 (90 Base) MCG/ACT inhaler Inhale 2 puffs into the lungs every 6 (six) hours as needed for wheezing or shortness of breath.  Marland Kitchen albuterol (PROVENTIL) (2.5 MG/3ML) 0.083% nebulizer solution Take 3 mLs (2.5 mg total) by nebulization every 6 (six) hours as needed for wheezing or shortness of breath.  . Ascorbic Acid (VITAMIN C PO) Take 1 tablet by mouth daily.   . cetirizine (ZYRTEC) 10 MG tablet Take 10 mg by mouth daily.  . Cholecalciferol (VITAMIN D-1000 MAX ST) 1000 UNITS tablet Take 1 tablet by mouth 2 (two) times daily.  . Cyanocobalamin (B-12 PO) Take 1 tablet by mouth daily.   . cyclobenzaprine (FLEXERIL) 10 MG tablet Take one tablet every 8 hours as needed  . DULoxetine (CYMBALTA) 60 MG capsule Take 1 capsule (60 mg total) by mouth daily.  . ferrous sulfate (IRON SUPPLEMENT) 325 (65 FE) MG tablet Take 1 tablet by mouth daily.  . fluticasone (FLONASE) 50 MCG/ACT nasal spray Place 2 sprays into both nostrils as needed.  . furosemide (LASIX) 80 MG tablet Take 1 tablet (80 mg total) by mouth 2 (two) times daily.  . hydrALAZINE (APRESOLINE) 25 MG tablet Take 1 tablet (25 mg total) by mouth 3 (three) times daily.  . hydrOXYzine (ATARAX/VISTARIL) 10 MG tablet Take 1 tablet (10 mg total) by mouth every 6 (six) hours as needed.  Marland Kitchen ipratropium (ATROVENT HFA) 17 MCG/ACT inhaler Inhale 2 puffs into the lungs every 6 (six) hours as needed.   . isosorbide mononitrate (IMDUR) 30 MG 24 hr tablet Take 1 tablet (30 mg total) by mouth daily.  Marland Kitchen levothyroxine (SYNTHROID) 25 MCG tablet Take 1 tablet (25 mcg total) by mouth daily before breakfast.  . Lysine 1000 MG TABS Take 1 tablet by mouth daily.   . Magnesium Oxide 400 (240 Mg) MG TABS Take 400 mg  by mouth 2 (two) times daily.   . metoprolol succinate (TOPROL-XL) 50 MG 24 hr tablet Take 1 tablet (50 mg total) by mouth daily. Take with or immediately following a meal.  . mometasone-formoterol (DULERA) 200-5 MCG/ACT AERO Inhale 2 puffs into the lungs 2 (two) times daily.  . Multiple Vitamins-Minerals (MULTIVITAMIN PO) Take 1 tablet by mouth daily.   . Omega-3 Fatty Acids (FISH OIL PO) Take 1 capsule by mouth 2 (two) times daily.   . ondansetron (ZOFRAN-ODT) 8 MG disintegrating tablet Take 1 tablet (8 mg total) by mouth every 8 (eight) hours as needed for nausea or vomiting.  Marland Kitchen oxyCODONE-acetaminophen (PERCOCET) 10-325 MG tablet Take 1 tablet by mouth every 8 (eight) hours as needed for pain. (Patient taking differently: Take 1 tablet by mouth every 8 (eight) hours as needed for pain. Pt instructions on bottle q6h PRN)  . SUMAtriptan (IMITREX) 100 MG tablet  May repeat in 2 hours if headache persists or recurs.  . temazepam (RESTORIL) 30 MG capsule Take 1 capsule (30 mg total) by mouth at bedtime as needed for sleep.     Allergies:   Chlorhexidine   Social History   Tobacco Use  . Smoking status: Former Smoker    Packs/day: 1.50    Years: 20.00    Pack years: 30.00    Types: Cigarettes    Start date: 03/19/1979    Quit date: 08/25/1999    Years since quitting: 19.0  . Smokeless tobacco: Never Used  . Tobacco comment: smoking cessation materials not required  Substance Use Topics  . Alcohol use: Not Currently    Alcohol/week: 0.0 standard drinks  . Drug use: No     Family Hx: The patient's family history includes Breast cancer in her paternal aunt; Dementia in her father; Depression in her mother; Diabetes in her father; Hyperlipidemia in her brother, brother, and father; Migraines in her mother.  ROS:   Please see the history of present illness.     All other systems reviewed and are negative.   Labs/Other Tests and Data Reviewed:    Recent Labs: 08/21/2018: TSH  5.94 08/28/2018: ALT 17; B Natriuretic Peptide >4,500.0 08/30/2018: BUN 21; Creatinine, Ser 3.99; Hemoglobin 7.1; Platelets 74; Potassium 4.1; Sodium 138   Recent Lipid Panel Lab Results  Component Value Date/Time   CHOL 131 05/17/2016 10:52 AM   CHOL 161 07/31/2015 12:46 PM   TRIG 171 (H) 05/17/2016 10:52 AM   HDL 48 (L) 05/17/2016 10:52 AM   HDL 39 (L) 07/31/2015 12:46 PM   CHOLHDL 2.7 05/17/2016 10:52 AM   LDLCALC 49 05/17/2016 10:52 AM   LDLCALC 50 07/31/2015 12:46 PM    Wt Readings from Last 3 Encounters:  09/17/18 154 lb 6 oz (70 kg)  09/16/18 158 lb 8 oz (71.9 kg)  09/08/18 160 lb 1.6 oz (72.6 kg)     Exam:    Vital Signs:  Wt 154 lb 6 oz (70 kg) Comment: self-reported  BMI 28.24 kg/m    Well nourished, well developed female in no  acute distress.   ASSESSMENT & PLAN:    1. Chronic heart failure with mildly reduced ejection fraction- - NYHA class III - euvolemic per patient's description of symptoms - weighing daily and says that her weight has been fairly stable - reminded to call for an overnight weight gain of >2 pounds or a weekly weight gain of >5 pounds - not adding salt to her food except on baked potatoes or fries which she doesn't eat much. Reviewed the importance of closely following a 2000mg  sodium diet - wearing oxygen at 1.5-2L around the clock  - saw cardiology (End) 08/28/2018 - may benefit from [facility] and/ or pulmonology referral - BNP 08/28/2018 was >4500.0 - now wearing compression socks and she says that her swelling has improved  2: ESRD on dialysis-   - receiving dialysis three days/ week - saw PCP Ancil Boozer) 09/16/2018 - BMP 08/30/2018 reviewed and showed sodium 138, potassium 4.1, creatinine 3.99 and GFR 11  3: Lymphedema- - stage 2 - she says that she has swelling in her legs when she first gets up and then it progresses during the day - wearing compression socks daily with improvement of swelling - encouraged to elevate her legs when  sitting for long periods of time - consider lymphapress compression boots if edema persists   COVID-19 Education: The signs and symptoms of COVID-19  were discussed with the patient and how to seek care for testing (follow up with PCP or arrange E-visit).  The importance of social distancing was discussed today.  Patient Risk:   After full review of this patients clinical status, I feel that they are at least moderate risk at this time.  Time:   Today, I have spent 18 minutes with the patient with telehealth technology discussing medications, diet and symptoms to report.     Medication Adjustments/Labs and Tests Ordered: Current medicines are reviewed at length with the patient today.  Concerns regarding medicines are outlined above.   Tests Ordered: No orders of the defined types were placed in this encounter.  Medication Changes: No orders of the defined types were placed in this encounter.   Disposition:  Follow-up in 2 months or sooner for any questions/problems before then.   Signed, Alisa Graff, FNP  09/17/2018 1:48 PM    [facility]

## 2018-09-17 NOTE — Patient Instructions (Signed)
Continue weighing daily and call for an overnight weight gain of > 2 pounds or a weekly weight gain of >5 pounds. 

## 2018-09-20 ENCOUNTER — Encounter: Payer: Self-pay | Admitting: Oncology

## 2018-09-20 ENCOUNTER — Emergency Department
Admission: EM | Admit: 2018-09-20 | Discharge: 2018-09-21 | Disposition: A | Discharge status: Home / Self Care | Payer: PPO | Attending: Emergency Medicine | Admitting: Emergency Medicine

## 2018-09-20 ENCOUNTER — Other Ambulatory Visit: Payer: Self-pay

## 2018-09-20 DIAGNOSIS — J9811 Atelectasis: Secondary | ICD-10-CM | POA: Diagnosis not present

## 2018-09-20 DIAGNOSIS — I132 Hypertensive heart and chronic kidney disease with heart failure and with stage 5 chronic kidney disease, or end stage renal disease: Secondary | ICD-10-CM | POA: Diagnosis not present

## 2018-09-20 DIAGNOSIS — Z992 Dependence on renal dialysis: Secondary | ICD-10-CM | POA: Insufficient documentation

## 2018-09-20 DIAGNOSIS — R110 Nausea: Secondary | ICD-10-CM

## 2018-09-20 DIAGNOSIS — Z79899 Other long term (current) drug therapy: Secondary | ICD-10-CM | POA: Insufficient documentation

## 2018-09-20 DIAGNOSIS — R1084 Generalized abdominal pain: Secondary | ICD-10-CM | POA: Diagnosis not present

## 2018-09-20 DIAGNOSIS — K3184 Gastroparesis: Secondary | ICD-10-CM | POA: Diagnosis not present

## 2018-09-20 DIAGNOSIS — I10 Essential (primary) hypertension: Secondary | ICD-10-CM | POA: Diagnosis not present

## 2018-09-20 DIAGNOSIS — R109 Unspecified abdominal pain: Secondary | ICD-10-CM | POA: Diagnosis present

## 2018-09-20 DIAGNOSIS — R001 Bradycardia, unspecified: Secondary | ICD-10-CM | POA: Diagnosis not present

## 2018-09-20 DIAGNOSIS — E875 Hyperkalemia: Secondary | ICD-10-CM | POA: Diagnosis not present

## 2018-09-20 DIAGNOSIS — R600 Localized edema: Secondary | ICD-10-CM | POA: Diagnosis not present

## 2018-09-20 DIAGNOSIS — R52 Pain, unspecified: Secondary | ICD-10-CM | POA: Diagnosis not present

## 2018-09-20 DIAGNOSIS — N186 End stage renal disease: Secondary | ICD-10-CM | POA: Diagnosis not present

## 2018-09-20 DIAGNOSIS — Z87891 Personal history of nicotine dependence: Secondary | ICD-10-CM | POA: Insufficient documentation

## 2018-09-20 DIAGNOSIS — R079 Chest pain, unspecified: Secondary | ICD-10-CM | POA: Diagnosis not present

## 2018-09-20 DIAGNOSIS — R188 Other ascites: Secondary | ICD-10-CM | POA: Diagnosis not present

## 2018-09-20 DIAGNOSIS — I509 Heart failure, unspecified: Secondary | ICD-10-CM | POA: Diagnosis not present

## 2018-09-20 DIAGNOSIS — I700 Atherosclerosis of aorta: Secondary | ICD-10-CM | POA: Diagnosis not present

## 2018-09-20 DIAGNOSIS — J90 Pleural effusion, not elsewhere classified: Secondary | ICD-10-CM | POA: Diagnosis not present

## 2018-09-20 LAB — CBC
HCT: 35.2 % — ABNORMAL LOW (ref 36.0–46.0)
Hemoglobin: 10.9 g/dL — ABNORMAL LOW (ref 12.0–15.0)
MCH: 33.7 pg (ref 26.0–34.0)
MCHC: 31 g/dL (ref 30.0–36.0)
MCV: 109 fL — ABNORMAL HIGH (ref 80.0–100.0)
Platelets: 283 10*3/uL (ref 150–400)
RBC: 3.23 MIL/uL — ABNORMAL LOW (ref 3.87–5.11)
RDW: 15.4 % (ref 11.5–15.5)
WBC: 7 10*3/uL (ref 4.0–10.5)
nRBC: 0.4 % — ABNORMAL HIGH (ref 0.0–0.2)

## 2018-09-20 LAB — COMPREHENSIVE METABOLIC PANEL
ALT: 45 U/L — ABNORMAL HIGH (ref 0–44)
AST: 42 U/L — ABNORMAL HIGH (ref 15–41)
Albumin: 3.5 g/dL (ref 3.5–5.0)
Alkaline Phosphatase: 101 U/L (ref 38–126)
Anion gap: 12 (ref 5–15)
BUN: 54 mg/dL — ABNORMAL HIGH (ref 8–23)
CO2: 25 mmol/L (ref 22–32)
Calcium: 9 mg/dL (ref 8.9–10.3)
Chloride: 94 mmol/L — ABNORMAL LOW (ref 98–111)
Creatinine, Ser: 4.66 mg/dL — ABNORMAL HIGH (ref 0.44–1.00)
GFR calc Af Amer: 11 mL/min — ABNORMAL LOW (ref >60)
GFR calc non Af Amer: 9 mL/min — ABNORMAL LOW (ref >60)
Glucose, Bld: 127 mg/dL — ABNORMAL HIGH (ref 70–99)
Potassium: 5.9 mmol/L — ABNORMAL HIGH (ref 3.5–5.1)
Sodium: 131 mmol/L — ABNORMAL LOW (ref 135–145)
Total Bilirubin: 0.6 mg/dL (ref 0.3–1.2)
Total Protein: 6.2 g/dL — ABNORMAL LOW (ref 6.5–8.1)

## 2018-09-20 LAB — LIPASE, BLOOD: Lipase: 34 U/L (ref 11–51)

## 2018-09-20 NOTE — ED Triage Notes (Signed)
Pt comes EMS with abdominal pain, chest pain. Pt has ovarian cancer but has not had chemo for 5-6 weeks. CHF dx recently with no SOB. 2 81mg  aspirin on board. Pt doubled up on metoprolol because they were underdosing her on it. 183/97 with EMS. Pt c/o n/v as well.

## 2018-09-20 NOTE — ED Notes (Signed)
Pt took a laxative last night and says the nausea and abdominal pain started after that

## 2018-09-21 ENCOUNTER — Inpatient Hospital Stay: Payer: PPO | Attending: Oncology | Admitting: Oncology

## 2018-09-21 ENCOUNTER — Encounter: Payer: Self-pay | Admitting: Oncology

## 2018-09-21 ENCOUNTER — Emergency Department: Payer: PPO

## 2018-09-21 DIAGNOSIS — M549 Dorsalgia, unspecified: Secondary | ICD-10-CM | POA: Insufficient documentation

## 2018-09-21 DIAGNOSIS — R531 Weakness: Secondary | ICD-10-CM | POA: Insufficient documentation

## 2018-09-21 DIAGNOSIS — G4700 Insomnia, unspecified: Secondary | ICD-10-CM | POA: Insufficient documentation

## 2018-09-21 DIAGNOSIS — R202 Paresthesia of skin: Secondary | ICD-10-CM | POA: Insufficient documentation

## 2018-09-21 DIAGNOSIS — N179 Acute kidney failure, unspecified: Secondary | ICD-10-CM | POA: Insufficient documentation

## 2018-09-21 DIAGNOSIS — G8929 Other chronic pain: Secondary | ICD-10-CM | POA: Insufficient documentation

## 2018-09-21 DIAGNOSIS — D649 Anemia, unspecified: Secondary | ICD-10-CM | POA: Insufficient documentation

## 2018-09-21 DIAGNOSIS — I132 Hypertensive heart and chronic kidney disease with heart failure and with stage 5 chronic kidney disease, or end stage renal disease: Secondary | ICD-10-CM | POA: Insufficient documentation

## 2018-09-21 DIAGNOSIS — J90 Pleural effusion, not elsewhere classified: Secondary | ICD-10-CM | POA: Insufficient documentation

## 2018-09-21 DIAGNOSIS — Z803 Family history of malignant neoplasm of breast: Secondary | ICD-10-CM | POA: Insufficient documentation

## 2018-09-21 DIAGNOSIS — G629 Polyneuropathy, unspecified: Secondary | ICD-10-CM | POA: Insufficient documentation

## 2018-09-21 DIAGNOSIS — R0989 Other specified symptoms and signs involving the circulatory and respiratory systems: Secondary | ICD-10-CM | POA: Insufficient documentation

## 2018-09-21 DIAGNOSIS — I700 Atherosclerosis of aorta: Secondary | ICD-10-CM | POA: Insufficient documentation

## 2018-09-21 DIAGNOSIS — I509 Heart failure, unspecified: Secondary | ICD-10-CM | POA: Insufficient documentation

## 2018-09-21 DIAGNOSIS — J9811 Atelectasis: Secondary | ICD-10-CM | POA: Diagnosis not present

## 2018-09-21 DIAGNOSIS — Z87891 Personal history of nicotine dependence: Secondary | ICD-10-CM

## 2018-09-21 DIAGNOSIS — C569 Malignant neoplasm of unspecified ovary: Secondary | ICD-10-CM | POA: Diagnosis not present

## 2018-09-21 DIAGNOSIS — Z818 Family history of other mental and behavioral disorders: Secondary | ICD-10-CM | POA: Insufficient documentation

## 2018-09-21 DIAGNOSIS — Z5111 Encounter for antineoplastic chemotherapy: Secondary | ICD-10-CM | POA: Insufficient documentation

## 2018-09-21 DIAGNOSIS — R188 Other ascites: Secondary | ICD-10-CM | POA: Diagnosis not present

## 2018-09-21 DIAGNOSIS — D696 Thrombocytopenia, unspecified: Secondary | ICD-10-CM | POA: Insufficient documentation

## 2018-09-21 DIAGNOSIS — Z9981 Dependence on supplemental oxygen: Secondary | ICD-10-CM | POA: Insufficient documentation

## 2018-09-21 DIAGNOSIS — N186 End stage renal disease: Secondary | ICD-10-CM | POA: Insufficient documentation

## 2018-09-21 DIAGNOSIS — I272 Pulmonary hypertension, unspecified: Secondary | ICD-10-CM | POA: Insufficient documentation

## 2018-09-21 DIAGNOSIS — Z79899 Other long term (current) drug therapy: Secondary | ICD-10-CM | POA: Insufficient documentation

## 2018-09-21 DIAGNOSIS — R0602 Shortness of breath: Secondary | ICD-10-CM | POA: Insufficient documentation

## 2018-09-21 DIAGNOSIS — R600 Localized edema: Secondary | ICD-10-CM | POA: Diagnosis not present

## 2018-09-21 DIAGNOSIS — Z833 Family history of diabetes mellitus: Secondary | ICD-10-CM | POA: Insufficient documentation

## 2018-09-21 DIAGNOSIS — M542 Cervicalgia: Secondary | ICD-10-CM | POA: Insufficient documentation

## 2018-09-21 DIAGNOSIS — R5383 Other fatigue: Secondary | ICD-10-CM | POA: Insufficient documentation

## 2018-09-21 DIAGNOSIS — K439 Ventral hernia without obstruction or gangrene: Secondary | ICD-10-CM | POA: Insufficient documentation

## 2018-09-21 DIAGNOSIS — Z83438 Family history of other disorder of lipoprotein metabolism and other lipidemia: Secondary | ICD-10-CM | POA: Insufficient documentation

## 2018-09-21 DIAGNOSIS — Z85828 Personal history of other malignant neoplasm of skin: Secondary | ICD-10-CM | POA: Insufficient documentation

## 2018-09-21 DIAGNOSIS — Z992 Dependence on renal dialysis: Secondary | ICD-10-CM | POA: Insufficient documentation

## 2018-09-21 DIAGNOSIS — R208 Other disturbances of skin sensation: Secondary | ICD-10-CM | POA: Insufficient documentation

## 2018-09-21 MED ORDER — HALOPERIDOL LACTATE 5 MG/ML IJ SOLN
1.0000 mg | Freq: Once | INTRAMUSCULAR | Status: AC
  Administered 2018-09-21: 01:00:00 1 mg via INTRAVENOUS
  Filled 2018-09-21: qty 1

## 2018-09-21 MED ORDER — DIPHENHYDRAMINE HCL 50 MG/ML IJ SOLN
12.5000 mg | INTRAMUSCULAR | Status: AC
  Administered 2018-09-21: 12.5 mg via INTRAVENOUS
  Filled 2018-09-21: qty 1

## 2018-09-21 MED ORDER — METOCLOPRAMIDE HCL 5 MG/ML IJ SOLN: 10.0000 mg | INTRAMUSCULAR | Status: DC

## 2018-09-21 MED ORDER — METOCLOPRAMIDE HCL 5 MG PO TABS: 5.0000 mg | ORAL_TABLET | Freq: Three times a day (TID) | ORAL | 1 refills | Status: DC | PRN

## 2018-09-21 MED ORDER — METOCLOPRAMIDE HCL 5 MG/ML IJ SOLN
5.0000 mg | INTRAMUSCULAR | Status: AC
  Administered 2018-09-21: 5 mg via INTRAVENOUS
  Filled 2018-09-21: qty 2

## 2018-09-21 NOTE — Progress Notes (Signed)
[place]  [phone] [phone]  ID: Jessica Burgess OB: 02-05-1956  MR#: [id number]  OIB#:[id number]  Patient Care Team: Steele Sizer, MD as PCP - General (Family Medicine) Lloyd Huger, MD as Consulting Physician (Oncology) Mellody Drown, MD as Consulting Physician (Obstetrics and Gynecology) Cathi Roan, [facility] (Pharmacist) Benedetto Goad, RN as Case Manager Clent Jacks, RN as Registered Nurse  I connected with Jessica Burgess on 09/24/18 at  2:45 PM EDT by video enabled telemedicine visit and verified that I am speaking with the correct person using two identifiers.   I discussed the limitations, risks, security and privacy concerns of performing an evaluation and management service by telemedicine and the availability of in-person appointments. I also discussed with the patient that there may be a patient responsible charge related to this service. The patient expressed understanding and agreed to proceed.   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  INTERVAL HISTORY: Patient agreed to video enabled telemedicine visit for further evaluation and treatment planning.  Her performance status has improved, although she admits to chronic weakness and fatigue.  She continues to require dialysis 3 days a week.  She has chronic shortness of breath and requires oxygen. She denies any recent fevers or illnesses.  She has no neurologic complaints.  She denies any chest pain, cough, or hemoptysis. She denies any nausea, vomiting, constipation, or diarrhea.  She denies any abdominal pain or bloating.  Patient offers no further specific complaints today.  REVIEW OF SYSTEMS:     Review of Systems  Constitutional: Positive for malaise/fatigue. Negative for fever and weight loss.  HENT: Negative.  Negative for sore throat.   Eyes: Negative.  Negative for blurred vision.  Respiratory: Positive for shortness of breath. Negative for cough.   Cardiovascular: Positive for leg swelling. Negative for chest pain.  Gastrointestinal: Negative.  Negative for abdominal pain, blood in stool, constipation, diarrhea, melena, nausea and vomiting.  Genitourinary: Negative.  Negative for dysuria.  Musculoskeletal: Positive for back pain and joint pain. Negative for neck pain.  Skin: Negative.  Negative for itching and rash.  Neurological: Positive for tingling, sensory change and weakness. Negative for focal weakness.  Psychiatric/Behavioral: The patient is nervous/anxious.      As per HPI. Otherwise, a complete review of systems is negative.  PAST MEDICAL HISTORY: Past Medical History:  Diagnosis Date   Allergic rhinitis, cause unspecified    Anxiety state, unspecified    Arthritis    Asthma    only when sick    Backache, unspecified    Bronchitis    hx of when get sick   Cancer [facility])    skin cancer , basal cell    Cancer ([place]) 11/2016   ovarian   Cervicalgia    CHF (congestive heart failure) (HCC)    Complication of anesthesia    Dermatophytosis of nail    Dysmetabolic syndrome X    Encounter for long-term (current) use of other medications    Esophageal reflux    Hypertension    Insomnia, unspecified    Leukocytosis, unspecified    Migraine without aura, without mention of intractable migraine without mention of status migrainosus    Other and unspecified hyperlipidemia    Other malaise and fatigue  Overweight(278.02)    Personal history of chemotherapy now   ovarian   PONV (postoperative nausea and vomiting)    Renal insufficiency    Spinal stenosis in cervical region    Symptomatic menopausal or female climacteric states     Unspecified disorder of skin and subcutaneous tissue    Unspecified vitamin D deficiency     PAST SURGICAL HISTORY: Past Surgical History:  Procedure Laterality Date   ABDOMINAL HYSTERECTOMY     ANTERIOR CERVICAL DECOMP/DISCECTOMY FUSION N/A 06/07/2015   Procedure: Cervical three - four and Cervical six- seven anterior cervical decompression with fusion interbody prosthesis plating and bonegraft;  Surgeon: Newman Pies, MD;  Location: [facility] NEURO ORS;  Service: Neurosurgery;  Laterality: N/A;  C34 and C67 anterior cervical decompression with fusion interbody prosthesis plating and bonegraft   BACK SURGERY     x2 Lower    DIALYSIS/PERMA CATHETER INSERTION N/A 08/17/2018   Procedure: DIALYSIS/PERMA CATHETER INSERTION;  Surgeon: Algernon Huxley, MD;  Location: [place] CV LAB;  Service: Cardiovascular;  Laterality: N/A;   EVACUATION OF CERVICAL HEMATOMA N/A 06/14/2015   Procedure: EVACUATION OF CERVICAL HEMATOMA;  Surgeon: Newman Pies, MD;  Location: [place] NEURO ORS;  Service: Neurosurgery;  Laterality: N/A;   NECK SURGERY     x3   TONSILLECTOMY      FAMILY HISTORY: Family History  Problem Relation Age of Onset   Depression Mother    Migraines Mother    Dementia Father    Diabetes Father    Hyperlipidemia Father    Hyperlipidemia Brother    Hyperlipidemia Brother    Breast cancer Paternal Aunt        65s    ADVANCED DIRECTIVES (Y/N):  N  HEALTH MAINTENANCE: Social History   Tobacco Use   Smoking status: Former Smoker    Packs/day: 1.50    Years: 20.00    Pack years: 30.00    Types: Cigarettes    Start date: 03/19/1979    Quit date: 08/25/1999    Years since quitting: 19.0   Smokeless tobacco: Never Used   Tobacco comment: smoking cessation materials not required  Substance Use Topics   Alcohol use: Not Currently    Alcohol/week: 0.0 standard drinks   Drug use: No     Colonoscopy:  PAP:  Bone density:  Lipid panel:  Allergies  Allergen  Reactions   Chlorhexidine Itching    Pt reports itching this AM post wipes w/ CHG, no redness or irritation assessed    Current Outpatient Medications  Medication Sig Dispense Refill   albuterol (PROVENTIL HFA;VENTOLIN HFA) 108 (90 Base) MCG/ACT inhaler Inhale 2 puffs into the lungs every 6 (six) hours as needed for wheezing or shortness of breath. 1 Inhaler 2   albuterol (PROVENTIL) (2.5 MG/3ML) 0.083% nebulizer solution Take 3 mLs (2.5 mg total) by nebulization every 6 (six) hours as needed for wheezing or shortness of breath. 75 mL 2   Ascorbic Acid (VITAMIN C PO) Take 1 tablet by mouth daily.      cetirizine (ZYRTEC) 10 MG tablet Take 10 mg by mouth daily.     Cholecalciferol (VITAMIN D-1000 MAX ST) 1000 UNITS tablet Take 1 tablet by mouth 2 (two) times daily.     Cyanocobalamin (B-12 PO) Take 1 tablet by mouth daily.      cyclobenzaprine (FLEXERIL) 10 MG tablet Take one tablet every 8 hours as needed 30 tablet 3   DULoxetine (CYMBALTA) 60 MG capsule Take 1 capsule (60 mg total) by  mouth daily. 90 capsule 0   ferrous sulfate (IRON SUPPLEMENT) 325 (65 FE) MG tablet Take 1 tablet by mouth daily.     furosemide (LASIX) 80 MG tablet Take 1 tablet (80 mg total) by mouth 2 (two) times daily. 180 tablet 3   hydrALAZINE (APRESOLINE) 25 MG tablet Take 1 tablet (25 mg total) by mouth 3 (three) times daily. 90 tablet 1   hydrOXYzine (ATARAX/VISTARIL) 10 MG tablet Take 1 tablet (10 mg total) by mouth every 6 (six) hours as needed. 120 tablet 0   ipratropium (ATROVENT HFA) 17 MCG/ACT inhaler Inhale 2 puffs into the lungs every 6 (six) hours as needed.      isosorbide mononitrate (IMDUR) 30 MG 24 hr tablet Take 1 tablet (30 mg total) by mouth daily. 30 tablet 2   levothyroxine (SYNTHROID) 25 MCG tablet Take 1 tablet (25 mcg total) by mouth daily before breakfast. 30 tablet 2   lidocaine (XYLOCAINE) 2 % solution Use as directed 15 mLs in the mouth or throat as needed for mouth pain.  (Patient taking differently: Use as directed 15 mLs in the mouth or throat as needed for mouth pain. Pt using topically for itching) 100 mL 0   Lysine 1000 MG TABS Take 1 tablet by mouth daily.      Magnesium Oxide 400 (240 Mg) MG TABS Take 400 mg by mouth 2 (two) times daily.      metoCLOPramide (REGLAN) 5 MG tablet Take 1 tablet (5 mg total) by mouth every 8 (eight) hours as needed for nausea or vomiting. 90 tablet 1   metoprolol succinate (TOPROL-XL) 50 MG 24 hr tablet Take 1 tablet (50 mg total) by mouth daily. Take with or immediately following a meal. 30 tablet 2   mometasone-formoterol (DULERA) 200-5 MCG/ACT AERO Inhale 2 puffs into the lungs 2 (two) times daily.     Multiple Vitamins-Minerals (MULTIVITAMIN PO) Take 1 tablet by mouth daily.      Omega-3 Fatty Acids (FISH OIL PO) Take 1 capsule by mouth 2 (two) times daily.      ondansetron (ZOFRAN-ODT) 8 MG disintegrating tablet Take 1 tablet (8 mg total) by mouth every 8 (eight) hours as needed for nausea or vomiting. 60 tablet 3   polyethylene glycol powder (GLYCOLAX/MIRALAX) 17 GM/SCOOP powder Take 17 g by mouth daily. PRN     SUMAtriptan (IMITREX) 100 MG tablet May repeat in 2 hours if headache persists or recurs. 9 tablet 1   temazepam (RESTORIL) 30 MG capsule Take 1 capsule (30 mg total) by mouth at bedtime as needed for sleep. 30 capsule 0   fluticasone (FLONASE) 50 MCG/ACT nasal spray Place 2 sprays into both nostrils as needed. 48 g 1   oxyCODONE-acetaminophen (PERCOCET) 10-325 MG tablet Take 1 tablet by mouth every 6 (six) hours as needed for pain. 60 tablet 0   No current facility-administered medications for this visit.    Facility-Administered Medications Ordered in Other Visits  Medication Dose Route Frequency Provider Last Rate Last Dose   0.9 %  sodium chloride infusion   Intravenous Once Grayland Ormond, Kathlene November, MD       0.9 %  sodium chloride infusion   Intravenous Once Grayland Ormond, Kathlene November, MD        dexamethasone (DECADRON) 20 mg in sodium chloride 0.9 % 50 mL IVPB  20 mg Intravenous Once Lloyd Huger, MD       dexamethasone (DECADRON) injection 10 mg  10 mg Intravenous Once Lloyd Huger, MD  sodium chloride flush (NS) 0.9 % injection 10 mL  10 mL Intravenous PRN Lloyd Huger, MD   10 mL at 06/16/17 0854    OBJECTIVE: There were no vitals filed for this visit.   There is no height or weight on file to calculate BMI.    ECOG FS:1 - Symptomatic but completely ambulatory  General: Well-developed, well-nourished, no acute distress. HEENT: Normocephalic. Neuro: Alert, answering all questions appropriately. Cranial nerves grossly intact. Skin: No rashes or petechiae noted. Psych: Normal affect.  LAB RESULTS:  Lab Results  Component Value Date   NA 131 (L) 09/20/2018   K 5.9 (H) 09/20/2018   CL 94 (L) 09/20/2018   CO2 25 09/20/2018   GLUCOSE 127 (H) 09/20/2018   BUN 54 (H) 09/20/2018   CREATININE 4.66 (H) 09/20/2018   CALCIUM 9.0 09/20/2018   PROT 6.2 (L) 09/20/2018   ALBUMIN 3.5 09/20/2018   AST 42 (H) 09/20/2018   ALT 45 (H) 09/20/2018   ALKPHOS 101 09/20/2018   BILITOT 0.6 09/20/2018   GFRNONAA 9 (L) 09/20/2018   GFRAA 11 (L) 09/20/2018    Lab Results  Component Value Date   WBC 7.0 09/20/2018   NEUTROABS 4.7 08/28/2018   HGB 10.9 (L) 09/20/2018   HCT 35.2 (L) 09/20/2018   MCV 109.0 (H) 09/20/2018   PLT 283 09/20/2018     STUDIES: Ct Abdomen Pelvis Wo Contrast  Result Date: 09/21/2018 CLINICAL DATA:  Nausea and vomiting. EXAM: CT ABDOMEN AND PELVIS WITHOUT CONTRAST TECHNIQUE: Multidetector CT imaging of the abdomen and pelvis was performed following the standard protocol without IV contrast. COMPARISON:  CT 08/06/2018. PET CT 05/07/2018 FINDINGS: Lower chest: Moderate bilateral pleural effusions with compressive atelectasis. Cardiomegaly. Hepatobiliary: No focal hepatic abnormality on noncontrast exam. Postcholecystectomy with unchanged  biliary prominence. Pancreas: No ductal dilatation. Mild stranding in the upper abdomen, without discrete peripancreatic inflammation. Spleen: Normal in size without focal abnormality. Adrenals/Urinary Tract: No adrenal nodule. No hydronephrosis. Mild symmetric bilateral perinephric edema. No urolithiasis. Urinary bladder is nondistended. Stomach/Bowel: Stomach is distended with ingested contents. No gastric wall thickening. No small bowel dilatation or obvious inflammation. No obstruction. Appendix not definitively visualized. Large volume of stool throughout the colon. No colonic wall thickening or inflammatory change. No abnormal rectal distention. Vascular/Lymphatic: Aortic atherosclerosis. No aneurysm. Limited assessment for adenopathy given ascites and Burgess contrast. No bulky abdominopelvic adenopathy. Reproductive: Status post hysterectomy. No adnexal masses. Other: Increase abdominopelvic ascites, no small to moderate. Increased mesenteric and generalized soft tissue edema from prior. Soft tissue nodularity in the left upper omentum, images 34 and 37, slight increase from prior. Questionable new omental nodularity anteriorly in the midline, image 46 series 3. Postsurgical change of the anterior abdominal wall. No free air. Small ventral abdominal wall hernia contains fat. Musculoskeletal: Postsurgical and degenerative change in the spine. There are no acute or suspicious osseous abnormalities. IMPRESSION: 1. Increased abdominopelvic ascites and generalized mesenteric and soft tissue edema. Slight increased soft tissue nodularity in the left upper omentum, possible new omental nodularity anteriorly. 2. Large colonic stool burden suggesting constipation. No bowel obstruction. Ingested material distends the stomach, if there is been no recent p.o. intake, question gastroparesis. 3. Moderate bilateral pleural effusions with compressive atelectasis. 4.  Aortic Atherosclerosis (ICD10-I70.0). Electronically Signed    By: Keith Rake M.D.   On: 09/21/2018 01:01   Dg Chest 2 View  Result Date: 08/28/2018 CLINICAL DATA:  History of ovarian cancer.  Pleural effusions. EXAM: CHEST - 2 VIEW COMPARISON:  Single-view  of the chest 08/15/2018 and 08/13/2018. CT chest 08/09/2018. FINDINGS: Port-A-Cath and dialysis catheter are again seen. Dialysis catheter tip is now positioned just within the right atrium. There are small bilateral pleural effusions are new since the most recent exam. Pulmonary vascular congestion and cardiomegaly noted. No acute bony abnormality. IMPRESSION: Small bilateral pleural effusions are new since the most recent examination. Mild cardiomegaly and vascular congestion. Electronically Signed   By: Inge Rise M.D.   On: 08/28/2018 14:44    ONCOLOGY HISTORY: Patient underwent debulking surgery at [facility] on November 19, 2016. She received one infusion preoperative infusion of Keytruda on November 11, 2016 as part of a clinical trial.  She initiated adjuvant carboplatinum and Taxol on December 16, 2016.  This was continued through April 28, 2017 at which time Taxol was discontinued secondary to worsening neuropathy.  Patient then received single agent carboplatinum for additional 2 infusions.  She was then noted to have progression of disease and was switched to Avastin and doxorubicin on August 18, 2017 this was discontinued on November 24, 2017 secondary to declining performance status.  Palliative gemcitabine was initiated on February 20, 2018 which was then discontinued Jul 27, 2018 secondary to declining performance status and end-stage renal disease.   ASSESSMENT: Stage IIIc high-grade serous ovarian carcinoma  PLAN:  1. Stage IIIc high-grade serous ovarian carcinoma: See oncology history as above.  Patient last received treatment with single agent gemcitabine on Jul 27, 2018.  Her most recent CA-125 from August 30, 2018 was significantly increased to 519 likely indicating progressive  disease.  Patient's performance status has improved and she would like to continue with palliative treatment.  She expressed understanding that her options are limited particularly now that she has dialysis.  Plan to reinitiate gemcitabine on days 1, 8, and 15 with a 22 off as previous.  Could also attempt Taxol or Taxotere, but there is concern that this would make her peripheral neuropathy worse.  BRCA and germline testing were negative on initial pathology.  Return to clinic on Thursday for further evaluation and reinitiation of gemcitabine. 2.  Shortness of breath: Likely multifactorial.  Continue dialysis 3 days a week as planned on Tuesday, Thursday, and Saturday.  Patient also has significant pulmonary hypertension that is likely contributing. 3.  Acute renal failure: Continue dialysisas above. 4.  Pain: Continue oxycodone as needed. 5.  Anemia: Hemoglobin improved to 10.9, monitor.  Okay to give Procrit with dialysis.  Continue to monitor closely and will transfuse if it falls below 7.0.  This will likely have to be done in conjunction with dialysis given her current fluid overload. 6.  Thrombocytopenia: Resolved.  I provided 25 minutes of face-to-face video visit time during this encounter, and > 50% was spent counseling as documented under my assessment & plan.   Lloyd Huger, MD   09/24/2018 7:11 AM

## 2018-09-21 NOTE — Discharge Instructions (Signed)
As we discussed, I suspect your digestive tract has slowed down and you are suffering from what is called gastroparesis.  I offered several times to admit you to the hospital but you prefer to go home which is certainly your right.  However please follow-up with your regular doctors including Dr. Grayland Ormond at the next available opportunity and definitely be sure to go to dialysis as planned.  Your potassium was a little bit high tonight which is common for dialysis patients but it is very important that you do not miss your treatment.  Please return immediately to the emergency department or call 911 if you develop any new or worsening symptoms.

## 2018-09-21 NOTE — ED Provider Notes (Signed)
[facility] Emergency Department Provider Note  ____________________________________________   First MD Initiated Contact with Patient 09/20/18 2334     (approximate)  I have reviewed the triage vital signs and the nursing notes.   HISTORY  Chief Complaint Abdominal Pain    HPI Jessica Burgess is a 63 y.o. female with extensive chronic medical issues as listed below which notably includes ovarian cancer for which she is supposed to start chemotherapy soon under the care of Dr. Grayland Ormond as well as CHF on chronic oxygen.  She presents for evaluation of persistent severe nausea throughout the day.  She reports that she ate a heavy dinner and has not been able to digest any of it.  She had one episode of dry heaving but mostly has had some severe nausea that nothing particular makes it better or worse.  She denies fever, sore throat, chest pain, shortness of breath greater than normal, and abdominal pain although she has a feeling of abdominal distention and fullness.  She feels constipated and had a small bowel movement after taking a laxative today but did not feel much relief.        Past Medical History:  Diagnosis Date   Allergic rhinitis, cause unspecified    Anxiety state, unspecified    Arthritis    Asthma    only when sick    Backache, unspecified    Bronchitis    hx of when get sick   Cancer [facility])    skin cancer , basal cell    Cancer ([place]) 11/2016   ovarian   Cervicalgia    CHF (congestive heart failure) (HCC)    Complication of anesthesia    Dermatophytosis of nail    Dysmetabolic syndrome X    Encounter for long-term (current) use of other medications    Esophageal reflux    Hypertension    Insomnia, unspecified    Leukocytosis, unspecified    Migraine without aura, without mention of intractable migraine without mention of status migrainosus    Other and unspecified hyperlipidemia    Other malaise and fatigue     Overweight(278.02)    Personal history of chemotherapy now   ovarian   PONV (postoperative nausea and vomiting)    Renal insufficiency    Spinal stenosis in cervical region    Symptomatic menopausal or female climacteric states    Unspecified disorder of skin and subcutaneous tissue    Unspecified vitamin D deficiency     Patient Active Problem List   Diagnosis Date Noted   CHF (congestive heart failure) ([place]) 08/28/2018   Acute respiratory failure ([place])    Anxiety 08/14/2018   ESRD on dialysis ([place])    Acute CHF (congestive heart failure) ([place]) 08/07/2018   Acute CHF ([place]) 08/06/2018   Hypertension due to drug 10/28/2017   Mucositis due to chemotherapy 09/02/2017   Goals of care, counseling/discussion 08/08/2017   Hypothyroidism due to medication 07/28/2017   Genetic testing 12/18/2016   Migraines 12/01/2016   Postoperative seroma of subcutaneous tissue after non-dermatologic procedure 12/01/2016   Transaminitis 12/01/2016   Anemia associated with acute blood loss 11/20/2016   Ovarian cancer, unspecified laterality ([place]) 11/12/2016   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   Examination of participant in clinical trial 11/01/2016   Chronic right-sided low back pain with right-sided sciatica 02/02/2016   Status post insertion of dialysis catheter ([place]) 08/01/2015   Hematoma 06/14/2015   Perennial allergic rhinitis 05/05/2015   Generalized  anxiety disorder 02/03/2015   Back pain, chronic 08/25/2014   Insomnia, persistent 08/25/2014   Chronic cervical pain 08/25/2014   Major depression, chronic ([place]) 08/25/2014   Dyslipidemia 08/25/2014   Gastro-esophageal reflux disease without esophagitis 08/25/2014   H/O high risk medication treatment 08/25/2014   Blood glucose elevated 08/25/2014   Migraine without aura and without status migrainosus, not intractable 08/25/2014   Climacteric 26/83/4196    Dysmetabolic syndrome 22/29/7989   Fungal infection of toenail 08/25/2014   Obesity (BMI 30.0-34.9) 08/25/2014   Vitamin D deficiency 08/25/2014   Engages in travel abroad 08/25/2014   Cervical disc disorder with radiculopathy 04/28/2013    Past Surgical History:  Procedure Laterality Date   ABDOMINAL HYSTERECTOMY     ANTERIOR CERVICAL DECOMP/DISCECTOMY FUSION N/A 06/07/2015   Procedure: Cervical three - four and Cervical six- seven anterior cervical decompression with fusion interbody prosthesis plating and bonegraft;  Surgeon: Newman Pies, MD;  Location: [place] NEURO ORS;  Service: Neurosurgery;  Laterality: N/A;  C34 and C67 anterior cervical decompression with fusion interbody prosthesis plating and bonegraft   BACK SURGERY     x2 Lower    DIALYSIS/PERMA CATHETER INSERTION N/A 08/17/2018   Procedure: DIALYSIS/PERMA CATHETER INSERTION;  Surgeon: Algernon Huxley, MD;  Location: [place] CV LAB;  Service: Cardiovascular;  Laterality: N/A;   EVACUATION OF CERVICAL HEMATOMA N/A 06/14/2015   Procedure: EVACUATION OF CERVICAL HEMATOMA;  Surgeon: Newman Pies, MD;  Location: [place] NEURO ORS;  Service: Neurosurgery;  Laterality: N/A;   NECK SURGERY     x3   TONSILLECTOMY      Prior to Admission medications   Medication Sig Start Date End Date Taking? Authorizing Provider  albuterol (PROVENTIL HFA;VENTOLIN HFA) 108 (90 Base) MCG/ACT inhaler Inhale 2 puffs into the lungs every 6 (six) hours as needed for wheezing or shortness of breath. 10/04/16   Hubbard Hartshorn, FNP  albuterol (PROVENTIL) (2.5 MG/3ML) 0.083% nebulizer solution Take 3 mLs (2.5 mg total) by nebulization every 6 (six) hours as needed for wheezing or shortness of breath. 08/18/18   Steele Sizer, MD  Ascorbic Acid (VITAMIN C PO) Take 1 tablet by mouth daily.     [provider]  cetirizine (ZYRTEC) 10 MG tablet Take 10 mg by mouth daily.    [provider]  Cholecalciferol (VITAMIN D-1000 MAX ST) 1000  UNITS tablet Take 1 tablet by mouth 2 (two) times daily.    [provider]  Cyanocobalamin (B-12 PO) Take 1 tablet by mouth daily.     [provider]  cyclobenzaprine (FLEXERIL) 10 MG tablet Take one tablet every 8 hours as needed 08/03/18   Lloyd Huger, MD  DULoxetine (CYMBALTA) 60 MG capsule Take 1 capsule (60 mg total) by mouth daily. 09/16/18   Steele Sizer, MD  ferrous sulfate (IRON SUPPLEMENT) 325 (65 FE) MG tablet Take 1 tablet by mouth daily.    [provider]  fluticasone (FLONASE) 50 MCG/ACT nasal spray Place 2 sprays into both nostrils as needed. 06/11/18 09/17/18  Steele Sizer, MD  furosemide (LASIX) 80 MG tablet Take 1 tablet (80 mg total) by mouth 2 (two) times daily. 08/28/18 11/26/18  End, Harrell Gave, MD  hydrALAZINE (APRESOLINE) 25 MG tablet Take 1 tablet (25 mg total) by mouth 3 (three) times daily. 08/18/18   Gladstone Lighter, MD  hydrOXYzine (ATARAX/VISTARIL) 10 MG tablet Take 1 tablet (10 mg total) by mouth every 6 (six) hours as needed. 09/16/18   Steele Sizer, MD  ipratropium (ATROVENT HFA)  17 MCG/ACT inhaler Inhale 2 puffs into the lungs every 6 (six) hours as needed.     [provider]  isosorbide mononitrate (IMDUR) 30 MG 24 hr tablet Take 1 tablet (30 mg total) by mouth daily. 08/18/18   Gladstone Lighter, MD  levothyroxine (SYNTHROID) 25 MCG tablet Take 1 tablet (25 mcg total) by mouth daily before breakfast. 07/21/18   Borders, Kirt Boys, NP  lidocaine (XYLOCAINE) 2 % solution Use as directed 15 mLs in the mouth or throat as needed for mouth pain. Patient taking differently: Use as directed 15 mLs in the mouth or throat as needed for mouth pain. Pt using topically for itching 11/28/17   Jacquelin Hawking, NP  Lysine 1000 MG TABS Take 1 tablet by mouth daily.     [provider]  Magnesium Oxide 400 (240 Mg) MG TABS Take 400 mg by mouth 2 (two) times daily.     [provider]  metoCLOPramide (REGLAN) 5 MG tablet  Take 1 tablet (5 mg total) by mouth every 8 (eight) hours as needed for nausea or vomiting. 09/21/18 09/21/19  Hinda Kehr, MD  metoprolol succinate (TOPROL-XL) 50 MG 24 hr tablet Take 1 tablet (50 mg total) by mouth daily. Take with or immediately following a meal. 08/18/18   Gladstone Lighter, MD  mometasone-formoterol [facility]) 200-5 MCG/ACT AERO Inhale 2 puffs into the lungs 2 (two) times daily.    [provider]  Multiple Vitamins-Minerals (MULTIVITAMIN PO) Take 1 tablet by mouth daily.     [provider]  Omega-3 Fatty Acids (FISH OIL PO) Take 1 capsule by mouth 2 (two) times daily.     [provider]  ondansetron (ZOFRAN-ODT) 8 MG disintegrating tablet Take 1 tablet (8 mg total) by mouth every 8 (eight) hours as needed for nausea or vomiting. 07/20/18   Borders, Kirt Boys, NP  oxyCODONE-acetaminophen (PERCOCET) 10-325 MG tablet Take 1 tablet by mouth every 8 (eight) hours as needed for pain. Patient taking differently: Take 1 tablet by mouth every 8 (eight) hours as needed for pain. Pt instructions on bottle q6h PRN 08/18/18   Gladstone Lighter, MD  polyethylene glycol powder (GLYCOLAX/MIRALAX) 17 GM/SCOOP powder Take 17 g by mouth daily. PRN    [provider]  SUMAtriptan (IMITREX) 100 MG tablet May repeat in 2 hours if headache persists or recurs. 09/16/18   Steele Sizer, MD  temazepam (RESTORIL) 30 MG capsule Take 1 capsule (30 mg total) by mouth at bedtime as needed for sleep. 09/16/18   Steele Sizer, MD  prochlorperazine (COMPAZINE) 10 MG tablet Take 1 tablet (10 mg total) by mouth every 6 (six) hours as needed (Nausea or vomiting). 12/02/16 08/08/17  Lloyd Huger, MD    Allergies Chlorhexidine  Family History  Problem Relation Age of Onset   Depression Mother    Migraines Mother    Dementia Father    Diabetes Father    Hyperlipidemia Father    Hyperlipidemia Brother    Hyperlipidemia Brother    Breast cancer Paternal Aunt         27s    Social History Social History   Tobacco Use   Smoking status: Former Smoker    Packs/day: 1.50    Years: 20.00    Pack years: 30.00    Types: Cigarettes    Start date: 03/19/1979    Quit date: 08/25/1999    Years since quitting: 19.0   Smokeless tobacco: Never Used   Tobacco comment: smoking cessation materials  not required  Substance Use Topics   Alcohol use: Not Currently    Alcohol/week: 0.0 standard drinks   Drug use: No    Review of Systems Constitutional: No fever/chills Eyes: No visual changes. ENT: No sore throat. Cardiovascular: Denies chest pain. Respiratory: Denies shortness of breath. Gastrointestinal: Abdominal fullness and bloating with nausea and one episode of "dry heaves".  Constipation. Genitourinary: Negative for dysuria. Musculoskeletal: Negative for neck pain.  Negative for back pain. Integumentary: Negative for rash. Neurological: Negative for headaches, focal weakness or numbness.   ____________________________________________   PHYSICAL EXAM:  VITAL SIGNS: ED Triage Vitals [09/20/18 2141]  Enc Vitals Group     BP (!) 185/98     Pulse Rate (!) 53     Resp 16     Temp (!) 97.5 F (36.4 C)     Temp Source Oral     SpO2 100 %     Weight 68 kg (150 lb)     Height 1.575 m (5\' 2" )     Head Circumference      Peak Flow      Pain Score 8     Pain Loc      Pain Edu?      Excl. in [place]?     Constitutional: Alert and oriented.  Appears chronically ill and uncomfortable but not in severe distress. Eyes: Conjunctivae are normal.  Head: Atraumatic. Nose: No congestion/rhinnorhea. Mouth/Throat: Mucous membranes are moist. Neck: No stridor.  No meningeal signs.   Cardiovascular: Normal rate, regular rhythm. Good peripheral circulation. Grossly normal heart sounds. Respiratory: Normal respiratory effort.  No retractions. No audible wheezing.  On chronic oxygen. Gastrointestinal: Soft and nontender. No distention appreciated on  palpation. Musculoskeletal: No lower extremity tenderness nor edema. No gross deformities of extremities. Neurologic:  Normal speech and language. No gross focal neurologic deficits are appreciated.  Skin:  Skin is warm, dry and intact. No rash noted. Psychiatric: Mood and affect are normal. Speech and behavior are normal.  ____________________________________________   LABS (all labs ordered are listed, but only abnormal results are displayed)  Labs Reviewed  COMPREHENSIVE METABOLIC PANEL - Abnormal; Notable for the following components:      Result Value   Sodium 131 (*)    Potassium 5.9 (*)    Chloride 94 (*)    Glucose, Bld 127 (*)    BUN 54 (*)    Creatinine, Ser 4.66 (*)    Total Protein 6.2 (*)    AST 42 (*)    ALT 45 (*)    GFR calc non Af Amer 9 (*)    GFR calc Af Amer 11 (*)    All other components within normal limits  CBC - Abnormal; Notable for the following components:   RBC 3.23 (*)    Hemoglobin 10.9 (*)    HCT 35.2 (*)    MCV 109.0 (*)    nRBC 0.4 (*)    All other components within normal limits  LIPASE, BLOOD  URINALYSIS, COMPLETE (UACMP) WITH MICROSCOPIC   ____________________________________________  EKG  ED ECG REPORT I, Hinda Kehr, the attending physician, personally viewed and interpreted this ECG.  Date: 09/20/2018 EKG Time: 21: 48 Rate: 51 Rhythm: Sinus bradycardia QRS Axis: normal Intervals: normal ST/T Wave abnormalities: Non-specific ST segment / T-wave changes, but no clear evidence of acute ischemia. Narrative Interpretation: no definitive evidence of acute ischemia; does not meet STEMI criteria.   ____________________________________________  RADIOLOGY   ED MD interpretation: Gastroparesis, no obvious obstruction,  concern for omental involvement from ovarian cancer.  Official radiology report(s): Ct Abdomen Pelvis Wo Contrast  Result Date: 09/21/2018 CLINICAL DATA:  Nausea and vomiting. EXAM: CT ABDOMEN AND PELVIS WITHOUT  CONTRAST TECHNIQUE: Multidetector CT imaging of the abdomen and pelvis was performed following the standard protocol without IV contrast. COMPARISON:  CT 08/06/2018. PET CT 05/07/2018 FINDINGS: Lower chest: Moderate bilateral pleural effusions with compressive atelectasis. Cardiomegaly. Hepatobiliary: No focal hepatic abnormality on noncontrast exam. Postcholecystectomy with unchanged biliary prominence. Pancreas: No ductal dilatation. Mild stranding in the upper abdomen, without discrete peripancreatic inflammation. Spleen: Normal in size without focal abnormality. Adrenals/Urinary Tract: No adrenal nodule. No hydronephrosis. Mild symmetric bilateral perinephric edema. No urolithiasis. Urinary bladder is nondistended. Stomach/Bowel: Stomach is distended with ingested contents. No gastric wall thickening. No small bowel dilatation or obvious inflammation. No obstruction. Appendix not definitively visualized. Large volume of stool throughout the colon. No colonic wall thickening or inflammatory change. No abnormal rectal distention. Vascular/Lymphatic: Aortic atherosclerosis. No aneurysm. Limited assessment for adenopathy given ascites and lack contrast. No bulky abdominopelvic adenopathy. Reproductive: Status post hysterectomy. No adnexal masses. Other: Increase abdominopelvic ascites, no small to moderate. Increased mesenteric and generalized soft tissue edema from prior. Soft tissue nodularity in the left upper omentum, images 34 and 37, slight increase from prior. Questionable new omental nodularity anteriorly in the midline, image 46 series 3. Postsurgical change of the anterior abdominal wall. No free air. Small ventral abdominal wall hernia contains fat. Musculoskeletal: Postsurgical and degenerative change in the spine. There are no acute or suspicious osseous abnormalities. IMPRESSION: 1. Increased abdominopelvic ascites and generalized mesenteric and soft tissue edema. Slight increased soft tissue  nodularity in the left upper omentum, possible new omental nodularity anteriorly. 2. Large colonic stool burden suggesting constipation. No bowel obstruction. Ingested material distends the stomach, if there is been no recent p.o. intake, question gastroparesis. 3. Moderate bilateral pleural effusions with compressive atelectasis. 4.  Aortic Atherosclerosis (ICD10-I70.0). Electronically Signed   By: Keith Rake M.D.   On: 09/21/2018 01:01    ____________________________________________   PROCEDURES   Procedure(s) performed (including Critical Care):  Procedures   ____________________________________________   INITIAL IMPRESSION / MDM / [place] / ED COURSE  As part of my medical decision making, I reviewed the following data within the [place] notes reviewed and incorporated, Labs reviewed , EKG interpreted , Old chart reviewed and Notes from prior ED visits        Differential diagnosis includes, but is not limited to, SBO/ileus, gastroparesis, neoplastic obstruction, complications of her cancer, acute infection.  Patient has no infectious signs or symptoms and is hemodynamically stable.  Although she has chronic issues with her lungs and breathing in general, she has no respiratory complaints or concerns at this time.  She is not yet on chemotherapy.  When asked her tonight the specific reason that is bothering her it is the nausea.  She is not having any abdominal tenderness to palpation and she just feels like her food is not going down.  I obtained the CT scan without any IV or oral contrast given her renal failure and dialysis needs.  There are some omental changes which are concerning in the setting of her ovarian cancer but most notably it does look like she is having gastroparesis but without any obvious sign of obstruction.  The patient received haloperidol 1 mg IV and Benadryl 12.5 mg IV which made her feel better but did not  completely resolve the  nausea.  After seeing the results of the CT scan I have ordered Reglan 5 mg IV to possibly help.  She is able to tolerate sips of liquid.  I offered her admission to the hospital both when I saw her initially and upon reassessment only went over the results, but she is adamant that she wants to go home.  She says she feels well enough to do so.  I explained that I was concerned that she would come right back particularly if she cannot tolerate anything by mouth but she says she wants to go.  She has the capacity to make her own decisions and I respect her right to do so but I did express my concern a number of times.  She is comfortable with the plan to get a prescription for Reglan, try drinking clear liquids, and follow-up with her doctors as scheduled.  I will send a message through [facility] to Dr. Grayland Ormond to let him know about the visit tonight and the disposition.  She understands to come back if her symptoms get worse and I gave her strict return precautions.  Of note, her labs are notable for no leukocytosis and her conference of metabolic panel shows a potassium of 5.9 but her EKG is reassuring and she has dialysis scheduled in about 24 hours.  There is no indication for urgent or emergent dialysis right now and she wants to follow-up as planned.      ____________________________________________  FINAL CLINICAL IMPRESSION(S) / ED DIAGNOSES  Final diagnoses:  Gastroparesis  Nausea  Hyperkalemia     MEDICATIONS GIVEN DURING THIS VISIT:  Medications  metoCLOPramide (REGLAN) injection 5 mg (has no administration in time range)  haloperidol lactate (HALDOL) injection 1 mg (1 mg Intravenous Given 09/21/18 0043)  diphenhydrAMINE (BENADRYL) injection 12.5 mg (12.5 mg Intravenous Given 09/21/18 0043)     ED Discharge Orders         Ordered    metoCLOPramide (REGLAN) 5 MG tablet  Every 8 hours PRN     09/21/18 0130          *Please note:  DANYELLE BROOKOVER was  evaluated in Emergency Department on 09/21/2018 for the symptoms described in the history of present illness. She was evaluated in the context of the global COVID-19 pandemic, which necessitated consideration that the patient might be at risk for infection with the SARS-CoV-2 virus that causes COVID-19. Institutional protocols and algorithms that pertain to the evaluation of patients at risk for COVID-19 are in a state of rapid change based on information released by regulatory bodies including the CDC and federal and state organizations. These policies and algorithms were followed during the patient's care in the ED.  Some ED evaluations and interventions may be delayed as a result of limited staffing during the pandemic.*  Note:  This document was prepared using Dragon voice recognition software and may include unintentional dictation errors.   Hinda Kehr, MD 09/21/18 [id number]

## 2018-09-21 NOTE — Progress Notes (Signed)
Patient stated that she is feeling better than yesterday. Patient also wants to know when she could restart her chemo therapy.

## 2018-09-22 ENCOUNTER — Ambulatory Visit: Payer: PPO | Admitting: Oncology

## 2018-09-23 ENCOUNTER — Telehealth: Payer: Self-pay | Admitting: Oncology

## 2018-09-23 ENCOUNTER — Other Ambulatory Visit: Payer: Self-pay

## 2018-09-23 ENCOUNTER — Other Ambulatory Visit: Payer: Self-pay | Admitting: *Deleted

## 2018-09-23 DIAGNOSIS — M542 Cervicalgia: Secondary | ICD-10-CM

## 2018-09-23 DIAGNOSIS — G8929 Other chronic pain: Secondary | ICD-10-CM

## 2018-09-23 DIAGNOSIS — C569 Malignant neoplasm of unspecified ovary: Secondary | ICD-10-CM

## 2018-09-23 MED ORDER — OXYCODONE-ACETAMINOPHEN 10-325 MG PO TABS: 1.0000 | ORAL_TABLET | Freq: Four times a day (QID) | ORAL | 0 refills | Status: DC | PRN

## 2018-09-23 NOTE — Telephone Encounter (Signed)
That sound fine to me.

## 2018-09-23 NOTE — Telephone Encounter (Signed)
Patient called requesting a refill of her Oxycodone 10/325 mg for every 6 hours as needed, her current prescription is for taking it every 8 hours as needed. Please advise

## 2018-09-23 NOTE — Telephone Encounter (Signed)
Spoke with pt to confirm appt date/time, do pre-appt screen which was completed, and adv of Covid-19 guidelines for appt regarding screening questions, temperature check, face mask required, and no visitors allowed °

## 2018-09-24 ENCOUNTER — Inpatient Hospital Stay: Payer: PPO

## 2018-09-24 ENCOUNTER — Inpatient Hospital Stay: Payer: PPO | Admitting: Oncology

## 2018-09-24 ENCOUNTER — Inpatient Hospital Stay (HOSPITAL_BASED_OUTPATIENT_CLINIC_OR_DEPARTMENT_OTHER): Payer: PPO | Admitting: Oncology

## 2018-09-24 ENCOUNTER — Other Ambulatory Visit: Payer: Self-pay

## 2018-09-24 VITALS — BP 180/90 | HR 68 | Temp 97.2°F | Resp 20 | Wt 155.0 lb

## 2018-09-24 DIAGNOSIS — G8929 Other chronic pain: Secondary | ICD-10-CM

## 2018-09-24 DIAGNOSIS — R208 Other disturbances of skin sensation: Secondary | ICD-10-CM | POA: Diagnosis not present

## 2018-09-24 DIAGNOSIS — C569 Malignant neoplasm of unspecified ovary: Secondary | ICD-10-CM

## 2018-09-24 DIAGNOSIS — Z992 Dependence on renal dialysis: Secondary | ICD-10-CM

## 2018-09-24 DIAGNOSIS — M542 Cervicalgia: Secondary | ICD-10-CM | POA: Diagnosis not present

## 2018-09-24 DIAGNOSIS — G629 Polyneuropathy, unspecified: Secondary | ICD-10-CM

## 2018-09-24 DIAGNOSIS — J90 Pleural effusion, not elsewhere classified: Secondary | ICD-10-CM

## 2018-09-24 DIAGNOSIS — R5383 Other fatigue: Secondary | ICD-10-CM

## 2018-09-24 DIAGNOSIS — N186 End stage renal disease: Secondary | ICD-10-CM

## 2018-09-24 DIAGNOSIS — R0602 Shortness of breath: Secondary | ICD-10-CM | POA: Diagnosis not present

## 2018-09-24 DIAGNOSIS — Z5111 Encounter for antineoplastic chemotherapy: Secondary | ICD-10-CM | POA: Diagnosis not present

## 2018-09-24 DIAGNOSIS — R0989 Other specified symptoms and signs involving the circulatory and respiratory systems: Secondary | ICD-10-CM

## 2018-09-24 DIAGNOSIS — Z85828 Personal history of other malignant neoplasm of skin: Secondary | ICD-10-CM

## 2018-09-24 DIAGNOSIS — K439 Ventral hernia without obstruction or gangrene: Secondary | ICD-10-CM

## 2018-09-24 DIAGNOSIS — N179 Acute kidney failure, unspecified: Secondary | ICD-10-CM | POA: Diagnosis not present

## 2018-09-24 DIAGNOSIS — Z83438 Family history of other disorder of lipoprotein metabolism and other lipidemia: Secondary | ICD-10-CM

## 2018-09-24 DIAGNOSIS — I700 Atherosclerosis of aorta: Secondary | ICD-10-CM | POA: Diagnosis not present

## 2018-09-24 DIAGNOSIS — G4700 Insomnia, unspecified: Secondary | ICD-10-CM

## 2018-09-24 DIAGNOSIS — I272 Pulmonary hypertension, unspecified: Secondary | ICD-10-CM | POA: Diagnosis not present

## 2018-09-24 DIAGNOSIS — M549 Dorsalgia, unspecified: Secondary | ICD-10-CM

## 2018-09-24 DIAGNOSIS — Z833 Family history of diabetes mellitus: Secondary | ICD-10-CM

## 2018-09-24 DIAGNOSIS — Z9981 Dependence on supplemental oxygen: Secondary | ICD-10-CM

## 2018-09-24 DIAGNOSIS — I132 Hypertensive heart and chronic kidney disease with heart failure and with stage 5 chronic kidney disease, or end stage renal disease: Secondary | ICD-10-CM

## 2018-09-24 DIAGNOSIS — D696 Thrombocytopenia, unspecified: Secondary | ICD-10-CM | POA: Diagnosis not present

## 2018-09-24 DIAGNOSIS — Z87891 Personal history of nicotine dependence: Secondary | ICD-10-CM

## 2018-09-24 DIAGNOSIS — Z79899 Other long term (current) drug therapy: Secondary | ICD-10-CM

## 2018-09-24 DIAGNOSIS — R531 Weakness: Secondary | ICD-10-CM

## 2018-09-24 DIAGNOSIS — Z803 Family history of malignant neoplasm of breast: Secondary | ICD-10-CM

## 2018-09-24 DIAGNOSIS — R188 Other ascites: Secondary | ICD-10-CM

## 2018-09-24 DIAGNOSIS — Z818 Family history of other mental and behavioral disorders: Secondary | ICD-10-CM

## 2018-09-24 DIAGNOSIS — D649 Anemia, unspecified: Secondary | ICD-10-CM

## 2018-09-24 DIAGNOSIS — R202 Paresthesia of skin: Secondary | ICD-10-CM | POA: Diagnosis not present

## 2018-09-24 DIAGNOSIS — I509 Heart failure, unspecified: Secondary | ICD-10-CM | POA: Diagnosis not present

## 2018-09-24 DIAGNOSIS — Z95828 Presence of other vascular implants and grafts: Secondary | ICD-10-CM

## 2018-09-24 LAB — CBC WITH DIFFERENTIAL/PLATELET
Abs Immature Granulocytes: 0.08 10*3/uL — ABNORMAL HIGH (ref 0.00–0.07)
Basophils Absolute: 0 10*3/uL (ref 0.0–0.1)
Basophils Relative: 0 %
Eosinophils Absolute: 0 10*3/uL (ref 0.0–0.5)
Eosinophils Relative: 0 %
HCT: 32.2 % — ABNORMAL LOW (ref 36.0–46.0)
Hemoglobin: 10.4 g/dL — ABNORMAL LOW (ref 12.0–15.0)
Immature Granulocytes: 1 %
Lymphocytes Relative: 5 %
Lymphs Abs: 0.3 10*3/uL — ABNORMAL LOW (ref 0.7–4.0)
MCH: 34 pg (ref 26.0–34.0)
MCHC: 32.3 g/dL (ref 30.0–36.0)
MCV: 105.2 fL — ABNORMAL HIGH (ref 80.0–100.0)
Monocytes Absolute: 0.4 10*3/uL (ref 0.1–1.0)
Monocytes Relative: 5 %
Neutro Abs: 5.9 10*3/uL (ref 1.7–7.7)
Neutrophils Relative %: 89 %
Platelets: 196 10*3/uL (ref 150–400)
RBC: 3.06 MIL/uL — ABNORMAL LOW (ref 3.87–5.11)
RDW: 15.6 % — ABNORMAL HIGH (ref 11.5–15.5)
WBC: 6.7 10*3/uL (ref 4.0–10.5)
nRBC: 0 % (ref 0.0–0.2)

## 2018-09-24 LAB — COMPREHENSIVE METABOLIC PANEL
ALT: 31 U/L (ref 0–44)
AST: 24 U/L (ref 15–41)
Albumin: 3.2 g/dL — ABNORMAL LOW (ref 3.5–5.0)
Alkaline Phosphatase: 78 U/L (ref 38–126)
Anion gap: 11 (ref 5–15)
BUN: 29 mg/dL — ABNORMAL HIGH (ref 8–23)
CO2: 30 mmol/L (ref 22–32)
Calcium: 8.3 mg/dL — ABNORMAL LOW (ref 8.9–10.3)
Chloride: 96 mmol/L — ABNORMAL LOW (ref 98–111)
Creatinine, Ser: 2.45 mg/dL — ABNORMAL HIGH (ref 0.44–1.00)
GFR calc Af Amer: 24 mL/min — ABNORMAL LOW (ref >60)
GFR calc non Af Amer: 20 mL/min — ABNORMAL LOW (ref >60)
Glucose, Bld: 130 mg/dL — ABNORMAL HIGH (ref 70–99)
Potassium: 3.8 mmol/L (ref 3.5–5.1)
Sodium: 137 mmol/L (ref 135–145)
Total Bilirubin: 0.7 mg/dL (ref 0.3–1.2)
Total Protein: 5.7 g/dL — ABNORMAL LOW (ref 6.5–8.1)

## 2018-09-24 LAB — SAMPLE TO BLOOD BANK

## 2018-09-24 MED ORDER — DEXAMETHASONE SODIUM PHOSPHATE 10 MG/ML IJ SOLN
10.0000 mg | Freq: Once | INTRAMUSCULAR | Status: AC
  Administered 2018-09-24: 14:00:00 10 mg via INTRAVENOUS
  Filled 2018-09-24: qty 1

## 2018-09-24 MED ORDER — PROCHLORPERAZINE MALEATE 10 MG PO TABS
10.0000 mg | ORAL_TABLET | Freq: Once | ORAL | Status: AC
  Administered 2018-09-24: 10 mg via ORAL
  Filled 2018-09-24: qty 1

## 2018-09-24 MED ORDER — SODIUM CHLORIDE 0.9 % IV SOLN: 10.0000 mg | Freq: Once | INTRAVENOUS | Status: DC

## 2018-09-24 MED ORDER — SODIUM CHLORIDE 0.9 % IV SOLN
1600.0000 mg | Freq: Once | INTRAVENOUS | Status: AC
  Administered 2018-09-24: 1600 mg via INTRAVENOUS
  Filled 2018-09-24: qty 26.3

## 2018-09-24 MED ORDER — HEPARIN SOD (PORK) LOCK FLUSH 100 UNIT/ML IV SOLN
500.0000 [IU] | Freq: Once | INTRAVENOUS | Status: AC | PRN
  Administered 2018-09-24: 15:00:00 500 [IU]
  Filled 2018-09-24: qty 5

## 2018-09-24 MED ORDER — SODIUM CHLORIDE 0.9% FLUSH
10.0000 mL | Freq: Once | INTRAVENOUS | Status: AC
  Administered 2018-09-24: 10 mL via INTRAVENOUS
  Filled 2018-09-24: qty 10

## 2018-09-24 MED ORDER — SODIUM CHLORIDE 0.9 % IV SOLN
Freq: Once | INTRAVENOUS | Status: AC
  Administered 2018-09-24: 14:00:00 via INTRAVENOUS
  Filled 2018-09-24: qty 250

## 2018-09-24 NOTE — Progress Notes (Signed)
Patient had dialysis this morning and is feeling very fatigued with increased SOBr with exertion. Low back and neck pain is 1-2/10 on pain scale.  Has episodes of nausea that is relieved with medication.   Diagnosed with CHF in may and has appt with cardiologist on 11/27/2018.

## 2018-09-25 LAB — THYROID PANEL WITH TSH
Free Thyroxine Index: 1.8 (ref 1.2–4.9)
T3 Uptake Ratio: 25 % (ref 24–39)
T4, Total: 7 ug/dL (ref 4.5–12.0)
TSH: 0.772 u[IU]/mL (ref 0.450–4.500)

## 2018-09-25 LAB — CA 125: Cancer Antigen (CA) 125: 784 U/mL — ABNORMAL HIGH (ref 0.0–38.1)

## 2018-09-27 NOTE — Progress Notes (Signed)
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  INTERVAL HISTORY: Patient returns to clinic today for further evaluation and reinitiation of chemotherapy with single agent gemcitabine.  She had dialysis this morning and complains of increased weakness and fatigue.  She also has chronic shortness of breath and is now is on oxygen 24 hours/day. She denies any recent fevers or illnesses.  She has no neurologic complaints.  She denies any chest pain, cough, or hemoptysis.  She has occasional nausea, but denies any vomiting, constipation, or diarrhea.  She denies any abdominal pain or bloating.  Patient offers no further specific complaints today.  REVIEW OF SYSTEMS:   Review of Systems  Constitutional: Positive for malaise/fatigue. Negative for fever and weight loss.  HENT: Negative.  Negative for sore throat.   Eyes: Negative.  Negative for blurred vision.  Respiratory: Positive for shortness of breath. Negative for cough.   Cardiovascular: Negative.  Negative for chest pain and leg swelling.  Gastrointestinal: Negative.  Negative for abdominal pain, blood in stool, constipation, diarrhea, melena, nausea and vomiting.  Genitourinary: Negative.  Negative for dysuria.  Musculoskeletal: Negative.  Negative for back pain, joint pain and neck pain.  Skin: Negative.  Negative for itching and rash.  Neurological: Positive for tingling, sensory  change and weakness. Negative for focal weakness.  Psychiatric/Behavioral: Negative.  The patient is not nervous/anxious.      As per HPI. Otherwise, a complete review of systems is negative.  PAST MEDICAL HISTORY: Past Medical History:  Diagnosis Date   Allergic rhinitis, cause unspecified    Anxiety state, unspecified    Arthritis    Asthma    only when sick    Backache, unspecified    Bronchitis    hx of when get sick   Cancer [facility])    skin cancer , basal cell    Cancer ([place]) 11/2016   ovarian   Cervicalgia    CHF (congestive heart failure) (HCC)    Complication of anesthesia    Dermatophytosis of nail    Dysmetabolic syndrome X    Encounter for long-term (current) use of other medications    Esophageal reflux    Hypertension    Insomnia, unspecified    Leukocytosis, unspecified    Migraine without aura, without mention of intractable migraine without mention of status migrainosus    Other and unspecified hyperlipidemia    Other malaise and fatigue    Overweight(278.02)    Personal history of chemotherapy now   ovarian   PONV (postoperative nausea and vomiting)    Renal insufficiency    Spinal stenosis in cervical region    Symptomatic menopausal or female climacteric states    Unspecified disorder of skin and subcutaneous tissue    Unspecified vitamin D deficiency     PAST SURGICAL HISTORY: Past Surgical History:  Procedure Laterality Date   ABDOMINAL HYSTERECTOMY     ANTERIOR CERVICAL DECOMP/DISCECTOMY FUSION N/A 06/07/2015   Procedure: Cervical three - four and Cervical six- seven anterior cervical decompression with fusion interbody prosthesis  plating and bonegraft;  Surgeon: Newman Pies, MD;  Location: [place] [facility];  Service: Neurosurgery;  Laterality: N/A;  C34 and C67 anterior cervical decompression with fusion interbody prosthesis plating and bonegraft   BACK SURGERY     x2 Lower    DIALYSIS/PERMA CATHETER INSERTION  N/A 08/17/2018   Procedure: DIALYSIS/PERMA CATHETER INSERTION;  Surgeon: Algernon Huxley, MD;  Location: [place] [facility];  Service: Cardiovascular;  Laterality: N/A;   EVACUATION OF CERVICAL HEMATOMA N/A 06/14/2015   Procedure: EVACUATION OF CERVICAL HEMATOMA;  Surgeon: Newman Pies, MD;  Location: [place] [facility];  Service: Neurosurgery;  Laterality: N/A;   NECK SURGERY     x3   TONSILLECTOMY      FAMILY HISTORY: Family History  Problem Relation Age of Onset   Depression Mother    Migraines Mother    Dementia Father    Diabetes Father    Hyperlipidemia Father    Hyperlipidemia Brother    Hyperlipidemia Brother    Breast cancer Paternal Aunt        44s    ADVANCED DIRECTIVES (Y/N):  N  HEALTH MAINTENANCE: Social History   Tobacco Use   Smoking status: Former Smoker    Packs/day: 1.50    Years: 20.00    Pack years: 30.00    Types: Cigarettes    Start date: 03/19/1979    Quit date: 08/25/1999    Years since quitting: 19.1   Smokeless tobacco: Never Used   Tobacco comment: smoking cessation materials not required  Substance Use Topics   Alcohol use: Not Currently    Alcohol/week: 0.0 standard drinks   Drug use: No     Colonoscopy:  PAP:  Bone density:  Lipid panel:  Allergies  Allergen Reactions   Chlorhexidine Itching    Pt reports itching this AM post wipes w/ CHG, no redness or irritation assessed    Current Outpatient Medications  Medication Sig Dispense Refill   albuterol (PROVENTIL HFA;VENTOLIN HFA) 108 (90 Base) MCG/ACT inhaler Inhale 2 puffs into the lungs every 6 (six) hours as needed for wheezing or shortness of breath. 1 Inhaler 2   albuterol (PROVENTIL) (2.5 MG/3ML) 0.083% nebulizer solution Take 3 mLs (2.5 mg total) by nebulization every 6 (six) hours as needed for wheezing or shortness of breath. 75 mL 2   Ascorbic Acid (VITAMIN C PO) Take 1 tablet by mouth daily.      cetirizine (ZYRTEC) 10 MG tablet Take 10 mg by mouth daily.      Cholecalciferol (VITAMIN D-1000 MAX ST) 1000 UNITS tablet Take 1 tablet by mouth 2 (two) times daily.     Cyanocobalamin (B-12 PO) Take 1 tablet by mouth daily.      cyclobenzaprine (FLEXERIL) 10 MG tablet Take one tablet every 8 hours as needed 30 tablet 3   DULoxetine (CYMBALTA) 60 MG capsule Take 1 capsule (60 mg total) by mouth daily. 90 capsule 0   ferrous sulfate (IRON SUPPLEMENT) 325 (65 FE) MG tablet Take 1 tablet by mouth daily.     furosemide (LASIX) 80 MG tablet Take 1 tablet (80 mg total) by mouth 2 (two) times daily. 180 tablet 3   hydrALAZINE (APRESOLINE) 25 MG tablet Take 1 tablet (25 mg total) by mouth 3 (three) times daily. 90 tablet 1   hydrOXYzine (ATARAX/VISTARIL) 10 MG tablet Take 1 tablet (10 mg total) by mouth every 6 (six) hours as needed. 120 tablet 0   ipratropium (ATROVENT HFA) 17 MCG/ACT inhaler Inhale 2 puffs into  the lungs every 6 (six) hours as needed.      isosorbide mononitrate (IMDUR) 30 MG 24 hr tablet Take 1 tablet (30 mg total) by mouth daily. 30 tablet 2   levothyroxine (SYNTHROID) 25 MCG tablet Take 1 tablet (25 mcg total) by mouth daily before breakfast. 30 tablet 2   Lysine 1000 MG TABS Take 1 tablet by mouth daily.      Magnesium Oxide 400 (240 Mg) MG TABS Take 400 mg by mouth 2 (two) times daily.      metoCLOPramide (REGLAN) 5 MG tablet Take 1 tablet (5 mg total) by mouth every 8 (eight) hours as needed for nausea or vomiting. 90 tablet 1   metoprolol succinate (TOPROL-XL) 50 MG 24 hr tablet Take 1 tablet (50 mg total) by mouth daily. Take with or immediately following a meal. 30 tablet 2   mometasone-formoterol (DULERA) 200-5 MCG/ACT AERO Inhale 2 puffs into the lungs 2 (two) times daily.     Multiple Vitamins-Minerals (MULTIVITAMIN PO) Take 1 tablet by mouth daily.      Omega-3 Fatty Acids (FISH OIL PO) Take 1 capsule by mouth 2 (two) times daily.      ondansetron (ZOFRAN-ODT) 8 MG disintegrating tablet Take 1 tablet (8 mg total) by  mouth every 8 (eight) hours as needed for nausea or vomiting. 60 tablet 3   oxyCODONE-acetaminophen (PERCOCET) 10-325 MG tablet Take 1 tablet by mouth every 6 (six) hours as needed for pain. 60 tablet 0   polyethylene glycol powder (GLYCOLAX/MIRALAX) 17 GM/SCOOP powder Take 17 g by mouth daily. PRN     SUMAtriptan (IMITREX) 100 MG tablet May repeat in 2 hours if headache persists or recurs. 9 tablet 1   temazepam (RESTORIL) 30 MG capsule Take 1 capsule (30 mg total) by mouth at bedtime as needed for sleep. 30 capsule 0   fluticasone (FLONASE) 50 MCG/ACT nasal spray Place 2 sprays into both nostrils as needed. 48 g 1   lidocaine (XYLOCAINE) 2 % solution Use as directed 15 mLs in the mouth or throat as needed for mouth pain. (Patient not taking: Reported on 09/24/2018) 100 mL 0   No current facility-administered medications for this visit.    Facility-Administered Medications Ordered in Other Visits  Medication Dose Route Frequency Provider Last Rate Last Dose   0.9 %  sodium chloride infusion   Intravenous Once Grayland Ormond, Kathlene November, MD       0.9 %  sodium chloride infusion   Intravenous Once Grayland Ormond, Kathlene November, MD       dexamethasone (DECADRON) 20 mg in sodium chloride 0.9 % 50 mL IVPB  20 mg Intravenous Once Lloyd Huger, MD       dexamethasone (DECADRON) injection 10 mg  10 mg Intravenous Once Lloyd Huger, MD       sodium chloride flush (NS) 0.9 % injection 10 mL  10 mL Intravenous PRN Lloyd Huger, MD   10 mL at 06/16/17 0854    OBJECTIVE: Vitals:   09/24/18 1339  BP: (!) 180/90  Pulse: 68  Resp: 20  Temp: (!) 97.2 F (36.2 C)  SpO2: 93%     Body mass index is 28.35 kg/m.    ECOG FS:1 - Symptomatic but completely ambulatory  General: Well-developed, well-nourished, no acute distress. Eyes: Pink conjunctiva, anicteric sclera. HEENT: Normocephalic, moist mucous membranes. Lungs: Clear to auscultation bilaterally. Heart: Regular rate and rhythm. No  rubs, murmurs, or gallops. Abdomen: Soft, nontender, nondistended. No organomegaly noted, normoactive bowel sounds.  Musculoskeletal: No edema, cyanosis, or clubbing. Neuro: Alert, answering all questions appropriately. Cranial nerves grossly intact. Skin: No rashes or petechiae noted. Psych: Normal affect.  LAB RESULTS:  Lab Results  Component Value Date   NA 137 09/24/2018   K 3.8 09/24/2018   CL 96 (L) 09/24/2018   CO2 30 09/24/2018   GLUCOSE 130 (H) 09/24/2018   BUN 29 (H) 09/24/2018   CREATININE 2.45 (H) 09/24/2018   CALCIUM 8.3 (L) 09/24/2018   PROT 5.7 (L) 09/24/2018   ALBUMIN 3.2 (L) 09/24/2018   AST 24 09/24/2018   ALT 31 09/24/2018   ALKPHOS 78 09/24/2018   BILITOT 0.7 09/24/2018   GFRNONAA 20 (L) 09/24/2018   GFRAA 24 (L) 09/24/2018    Lab Results  Component Value Date   WBC 6.7 09/24/2018   NEUTROABS 5.9 09/24/2018   HGB 10.4 (L) 09/24/2018   HCT 32.2 (L) 09/24/2018   MCV 105.2 (H) 09/24/2018   PLT 196 09/24/2018     STUDIES: Ct Abdomen Pelvis Wo Contrast  Result Date: 09/21/2018 CLINICAL DATA:  Nausea and vomiting. EXAM: CT ABDOMEN AND PELVIS WITHOUT CONTRAST TECHNIQUE: Multidetector CT imaging of the abdomen and pelvis was performed following the standard protocol without IV contrast. COMPARISON:  CT 08/06/2018. PET CT 05/07/2018 FINDINGS: Lower chest: Moderate bilateral pleural effusions with compressive atelectasis. Cardiomegaly. Hepatobiliary: No focal hepatic abnormality on noncontrast exam. Postcholecystectomy with unchanged biliary prominence. Pancreas: No ductal dilatation. Mild stranding in the upper abdomen, without discrete peripancreatic inflammation. Spleen: Normal in size without focal abnormality. Adrenals/Urinary Tract: No adrenal nodule. No hydronephrosis. Mild symmetric bilateral perinephric edema. No urolithiasis. Urinary bladder is nondistended. Stomach/Bowel: Stomach is distended with ingested contents. No gastric wall thickening. No small  bowel dilatation or obvious inflammation. No obstruction. Appendix not definitively visualized. Large volume of stool throughout the colon. No colonic wall thickening or inflammatory change. No abnormal rectal distention. Vascular/Lymphatic: Aortic atherosclerosis. No aneurysm. Limited assessment for adenopathy given ascites and Burgess contrast. No bulky abdominopelvic adenopathy. Reproductive: Status post hysterectomy. No adnexal masses. Other: Increase abdominopelvic ascites, no small to moderate. Increased mesenteric and generalized soft tissue edema from prior. Soft tissue nodularity in the left upper omentum, images 34 and 37, slight increase from prior. Questionable new omental nodularity anteriorly in the midline, image 46 series 3. Postsurgical change of the anterior abdominal wall. No free air. Small ventral abdominal wall hernia contains fat. Musculoskeletal: Postsurgical and degenerative change in the spine. There are no acute or suspicious osseous abnormalities. IMPRESSION: 1. Increased abdominopelvic ascites and generalized mesenteric and soft tissue edema. Slight increased soft tissue nodularity in the left upper omentum, possible new omental nodularity anteriorly. 2. Large colonic stool burden suggesting constipation. No bowel obstruction. Ingested material distends the stomach, if there is been no recent p.o. intake, question gastroparesis. 3. Moderate bilateral pleural effusions with compressive atelectasis. 4.  Aortic Atherosclerosis (ICD10-I70.0). Electronically Signed   By: Keith Rake M.D.   On: 09/21/2018 01:01   Dg Chest 2 View  Result Date: 08/28/2018 CLINICAL DATA:  History of ovarian cancer.  Pleural effusions. EXAM: CHEST - 2 VIEW COMPARISON:  Single-view of the chest 08/15/2018 and 08/13/2018. CT chest 08/09/2018. FINDINGS: Port-A-Cath and dialysis catheter are again seen. Dialysis catheter tip is now positioned just within the right atrium. There are small bilateral pleural  effusions are new since the most recent exam. Pulmonary vascular congestion and cardiomegaly noted. No acute bony abnormality. IMPRESSION: Small bilateral pleural effusions are new since the most recent examination. Mild cardiomegaly  and vascular congestion. Electronically Signed   By: Inge Rise M.D.   On: 08/28/2018 14:44    ONCOLOGY HISTORY: Patient underwent debulking surgery at [facility] on November 19, 2016. She received one infusion preoperative infusion of Keytruda on November 11, 2016 as part of a clinical trial.  She initiated adjuvant carboplatinum and Taxol on December 16, 2016.  This was continued through April 28, 2017 at which time Taxol was discontinued secondary to worsening neuropathy.  Patient then received single agent carboplatinum for additional 2 infusions.  She was then noted to have progression of disease and was switched to Avastin and doxorubicin on August 18, 2017 this was discontinued on November 24, 2017 secondary to declining performance status.  Palliative gemcitabine was initiated on February 20, 2018 which was then discontinued Jul 27, 2018 secondary to declining performance status and end-stage renal disease.   ASSESSMENT: Stage IIIc high-grade serous ovarian carcinoma  PLAN:  1. Stage IIIc high-grade serous ovarian carcinoma: See oncology history as above.  Patient last received treatment with single agent gemcitabine on Jul 27, 2018.  Her most recent CA-125 is 784.0.  Patient's performance status has improved and she would like to continue with palliative treatment.  She expressed understanding that her options are limited particularly now that she has dialysis.  Plan to reinitiate gemcitabine on days 1, 8, and 15 with a 22 off as previous.  Could also attempt Taxol or Taxotere, but there is concern that this would make her peripheral neuropathy worse.  BRCA and germline testing were negative on initial pathology.  Proceed with cycle 7, day 1 of single agent  gemcitabine.  Patient would like to transition her treatments back to Tuesday, therefore she will return to clinic on October 06, 2018 for consideration of cycle 7, day 8.  2.  Shortness of breath: Likely multifactorial.  Continue dialysis 3 days a week as planned on Tuesday, Thursday, and Saturday.  Patient also has significant pulmonary hypertension that is likely contributing.  Continue oxygen 24 hours/day as prescribed. 3.  Acute renal failure: Continue dialysisas above. 4.  Pain: Continue oxycodone as needed. 5.  Anemia: Hemoglobin decreased, but stable at 10.4. Okay to give Procrit with dialysis.  Continue to monitor closely and will transfuse if it falls below 7.0.  This will likely have to be done in conjunction with dialysis given her current fluid overload. 6.  Thrombocytopenia: Resolved.  I spent a total of 30 minutes face-to-face with the patient of which greater than 50% of the visit was spent in counseling and coordination of care as detailed above.    Lloyd Huger, MD   09/27/2018 6:45 AM

## 2018-09-28 ENCOUNTER — Ambulatory Visit (INDEPENDENT_AMBULATORY_CARE_PROVIDER_SITE_OTHER): Payer: PPO | Admitting: Family Medicine

## 2018-09-28 ENCOUNTER — Telehealth: Payer: Self-pay

## 2018-09-28 ENCOUNTER — Other Ambulatory Visit: Payer: Self-pay

## 2018-09-28 ENCOUNTER — Encounter: Payer: Self-pay | Admitting: Family Medicine

## 2018-09-28 VITALS — BP 180/90 | HR 65 | Temp 97.3°F | Resp 16 | Ht 62.0 in | Wt 159.2 lb

## 2018-09-28 DIAGNOSIS — I10 Essential (primary) hypertension: Secondary | ICD-10-CM | POA: Diagnosis not present

## 2018-09-28 MED ORDER — HYDRALAZINE HCL 25 MG PO TABS: 25.0000 mg | ORAL_TABLET | Freq: Three times a day (TID) | ORAL | 0 refills | Status: DC

## 2018-09-28 NOTE — Progress Notes (Signed)
Name: Jessica Burgess   MRN: [id number]    DOB: Sep 09, 1955   Date:09/28/2018       Progress Note  Subjective  Chief Complaint  Chief Complaint  Patient presents with  . Hypertension    She reports that she has tremors and headaches after taking Amlodipine and Losartan. She said her bp on Thursday was 200/157.     HPI  HTN: she states bp has been elevated even with all the medications, however she does not have hydralazine with her medications, we will send a refill. She states she has some headache at mid day , no dizziness. She has renal failure and is on HD three days a week, seen by Dr. Holley Raring a couple of weeks ago and is on losartan 50 mg and also Norvasct 5 mg. She denies chest pain or palpitation  Sore throat: started a couple of days ago, denies post-nasal drainage , no fever or chills. She has nasal cannula oxygen and is a mouth breather, gave her reassurance and advised to monitor for now, may try gargling with warm salted water    Patient Active Problem List   Diagnosis Date Noted  . CHF (congestive heart failure) ([place]) 08/28/2018  . Acute respiratory failure ([place])   . Anxiety 08/14/2018  . ESRD on dialysis ([place])   . Acute CHF (congestive heart failure) ([place]) 08/07/2018  . Acute CHF ([place]) 08/06/2018  . 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 Mucositis due to chemotherapy 09/02/2017  . Goals of care, counseling/discussion 08/08/2017  . Hypothyroidism due to medication 07/28/2017  . Genetic testing 12/18/2016  . Migraines 12/01/2016  . Postoperative seroma of subcutaneous tissue after non-dermatologic procedure 12/01/2016  . Transaminitis 12/01/2016  . Anemia associated with acute blood loss 11/20/2016  . Ovarian cancer, unspecified laterality ([place]) 11/12/2016  . 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 Examination of participant in clinical trial 11/01/2016  . Carcinomatosis ([place]) 11/01/2016  . Chronic right-sided low back pain with  right-sided sciatica 02/02/2016  . Status post insertion of dialysis catheter ([place]) 08/01/2015  . Hematoma 06/14/2015  . Perennial allergic rhinitis 05/05/2015  . Generalized anxiety disorder 02/03/2015  . Back pain, chronic 08/25/2014  . Insomnia, persistent 08/25/2014  . Chronic cervical pain 08/25/2014  . Major depression, chronic ([place]) 08/25/2014  . Dyslipidemia 08/25/2014  . Gastro-esophageal reflux disease without esophagitis 08/25/2014  . H/O high risk medication treatment 08/25/2014  . Blood glucose elevated 08/25/2014  . Migraine without aura and without status migrainosus, not intractable 08/25/2014  . Climacteric 08/25/2014  . Dysmetabolic syndrome 18/84/1660  . Fungal infection of toenail 08/25/2014  . Obesity (BMI 30.0-34.9) 08/25/2014  . Vitamin D deficiency 08/25/2014  . Engages in travel abroad 08/25/2014  . Cervical radiculitis 07/23/2013  . Cervical disc disorder with radiculopathy 04/28/2013    Past Surgical History:  Procedure Laterality Date  . ABDOMINAL HYSTERECTOMY    . ANTERIOR CERVICAL DECOMP/DISCECTOMY FUSION N/A 06/07/2015   Procedure: Cervical three - four and Cervical six- seven anterior cervical decompression with fusion interbody prosthesis plating and bonegraft;  Surgeon: Newman Pies, MD;  Location: [facility];  Service: Neurosurgery;  Laterality: N/A;  C34 and C67 anterior cervical decompression with fusion interbody prosthesis plating and bonegraft  . BACK SURGERY     x2 Lower   . DIALYSIS/PERMA CATHETER INSERTION N/A 08/17/2018   Procedure: DIALYSIS/PERMA CATHETER INSERTION;  Surgeon: Algernon Huxley, MD;  Location: [facility];  Service: Cardiovascular;  Laterality: N/A;  .  EVACUATION OF CERVICAL HEMATOMA N/A 06/14/2015   Procedure: EVACUATION OF CERVICAL HEMATOMA;  Surgeon: Newman Pies, MD;  Location: [facility];  Service: Neurosurgery;  Laterality: N/A;  . NECK SURGERY     x3  . TONSILLECTOMY      Family History  Problem  Relation Age of Onset  . Depression Mother   . Migraines Mother   . Dementia Father   . Diabetes Father   . Hyperlipidemia Father   . Hyperlipidemia Brother   . Hyperlipidemia Brother   . Breast cancer Paternal Aunt        82s    Social History   Socioeconomic History  . Marital status: Single    Spouse name: Not on file  . Number of children: 0  . Years of education: some college  . Highest education level: 12th grade  Occupational History    Employer: DISABLED  . Occupation: Disabled   Social Needs  . Financial resource strain: Somewhat hard  . Food insecurity    Worry: Never true    Inability: Never true  . Transportation needs    Medical: No    Non-medical: No  Tobacco Use  . Smoking status: Former Smoker    Packs/day: 1.50    Years: 20.00    Pack years: 30.00    Types: Cigarettes    Start date: 03/19/1979    Quit date: 08/25/1999    Years since quitting: 19.1  . Smokeless tobacco: Never Used  . Tobacco comment: smoking cessation materials not required  Substance and Sexual Activity  . Alcohol use: Not Currently    Alcohol/week: 0.0 standard drinks  . Drug use: No  . Sexual activity: Never  Lifestyle  . Physical activity    Days per week: 0 days    Minutes per session: 0 min  . Stress: Very much  Relationships  . Social Herbalist on phone: Patient refused    Gets together: Patient refused    Attends religious service: Patient refused    Active member of club or organization: Patient refused    Attends meetings of clubs or organizations: Patient refused    Relationship status: Patient refused  . Intimate partner violence    Fear of current or ex partner: No    Emotionally abused: No    Physically abused: No    Forced sexual activity: No  Other Topics Concern  . Not on file  Social History Narrative   Patient is single.    Patient lives with roommates.    Patient on disability    Patient has no children.    Patient has some college       Current Outpatient Medications:  .  albuterol (PROVENTIL HFA;VENTOLIN HFA) 108 (90 Base) MCG/ACT inhaler, Inhale 2 puffs into the lungs every 6 (six) hours as needed for wheezing or shortness of breath., Disp: 1 Inhaler, Rfl: 2 .  albuterol (PROVENTIL) (2.5 MG/3ML) 0.083% nebulizer solution, Take 3 mLs (2.5 mg total) by nebulization every 6 (six) hours as needed for wheezing or shortness of breath., Disp: 75 mL, Rfl: 2 .  Ascorbic Acid (VITAMIN C PO), Take 1 tablet by mouth daily. , Disp: , Rfl:  .  cetirizine (ZYRTEC) 10 MG tablet, Take 10 mg by mouth daily., Disp: , Rfl:  .  Cholecalciferol (VITAMIN D-1000 MAX ST) 1000 UNITS tablet, Take 1 tablet by mouth 2 (two) times daily., Disp: , Rfl:  .  Cyanocobalamin (B-12 PO), Take 1 tablet by  mouth daily. , Disp: , Rfl:  .  cyclobenzaprine (FLEXERIL) 10 MG tablet, Take one tablet every 8 hours as needed, Disp: 30 tablet, Rfl: 3 .  DULoxetine (CYMBALTA) 60 MG capsule, Take 1 capsule (60 mg total) by mouth daily., Disp: 90 capsule, Rfl: 0 .  ferrous sulfate (IRON SUPPLEMENT) 325 (65 FE) MG tablet, Take 1 tablet by mouth daily., Disp: , Rfl:  .  furosemide (LASIX) 80 MG tablet, Take 1 tablet (80 mg total) by mouth 2 (two) times daily., Disp: 180 tablet, Rfl: 3 .  hydrALAZINE (APRESOLINE) 25 MG tablet, Take 1 tablet (25 mg total) by mouth 3 (three) times daily., Disp: 90 tablet, Rfl: 1 .  hydrOXYzine (ATARAX/VISTARIL) 10 MG tablet, Take 1 tablet (10 mg total) by mouth every 6 (six) hours as needed., Disp: 120 tablet, Rfl: 0 .  ipratropium (ATROVENT HFA) 17 MCG/ACT inhaler, Inhale 2 puffs into the lungs every 6 (six) hours as needed. , Disp: , Rfl:  .  isosorbide mononitrate (IMDUR) 30 MG 24 hr tablet, Take 1 tablet (30 mg total) by mouth daily., Disp: 30 tablet, Rfl: 2 .  levothyroxine (SYNTHROID) 25 MCG tablet, Take 1 tablet (25 mcg total) by mouth daily before breakfast., Disp: 30 tablet, Rfl: 2 .  lidocaine (XYLOCAINE) 2 % solution, Use as directed  15 mLs in the mouth or throat as needed for mouth pain., Disp: 100 mL, Rfl: 0 .  Lysine 1000 MG TABS, Take 1 tablet by mouth daily. , Disp: , Rfl:  .  Magnesium Oxide 400 (240 Mg) MG TABS, Take 400 mg by mouth 2 (two) times daily. , Disp: , Rfl:  .  metoCLOPramide (REGLAN) 5 MG tablet, Take 1 tablet (5 mg total) by mouth every 8 (eight) hours as needed for nausea or vomiting., Disp: 90 tablet, Rfl: 1 .  metoprolol succinate (TOPROL-XL) 50 MG 24 hr tablet, Take 1 tablet (50 mg total) by mouth daily. Take with or immediately following a meal., Disp: 30 tablet, Rfl: 2 .  mometasone-formoterol (DULERA) 200-5 MCG/ACT AERO, Inhale 2 puffs into the lungs 2 (two) times daily., Disp: , Rfl:  .  Multiple Vitamins-Minerals (MULTIVITAMIN PO), Take 1 tablet by mouth daily. , Disp: , Rfl:  .  Omega-3 Fatty Acids (FISH OIL PO), Take 1 capsule by mouth 2 (two) times daily. , Disp: , Rfl:  .  ondansetron (ZOFRAN-ODT) 8 MG disintegrating tablet, Take 1 tablet (8 mg total) by mouth every 8 (eight) hours as needed for nausea or vomiting., Disp: 60 tablet, Rfl: 3 .  oxyCODONE-acetaminophen (PERCOCET) 10-325 MG tablet, Take 1 tablet by mouth every 6 (six) hours as needed for pain., Disp: 60 tablet, Rfl: 0 .  polyethylene glycol powder (GLYCOLAX/MIRALAX) 17 GM/SCOOP powder, Take 17 g by mouth daily. PRN, Disp: , Rfl:  .  SUMAtriptan (IMITREX) 100 MG tablet, May repeat in 2 hours if headache persists or recurs., Disp: 9 tablet, Rfl: 1 .  temazepam (RESTORIL) 30 MG capsule, Take 1 capsule (30 mg total) by mouth at bedtime as needed for sleep., Disp: 30 capsule, Rfl: 0 .  fluticasone (FLONASE) 50 MCG/ACT nasal spray, Place 2 sprays into both nostrils as needed., Disp: 48 g, Rfl: 1 No current facility-administered medications for this visit.   Facility-Administered Medications Ordered in Other Visits:  .  0.9 %  sodium chloride infusion, , Intravenous, Once, Finnegan, Kathlene November, MD .  0.9 %  sodium chloride infusion, ,  Intravenous, Once, [place] Ormond, Kathlene November, MD .  dexamethasone (DECADRON)  20 mg in sodium chloride 0.9 % 50 mL IVPB, 20 mg, Intravenous, Once, Finnegan, Kathlene November, MD .  dexamethasone (DECADRON) injection 10 mg, 10 mg, Intravenous, Once, Finnegan, Kathlene November, MD .  sodium chloride flush (NS) 0.9 % injection 10 mL, 10 mL, Intravenous, PRN, Lloyd Huger, MD, 10 mL at 06/16/17 0854  Allergies  Allergen Reactions  . Chlorhexidine Itching    Pt reports itching this AM post wipes w/ CHG, no redness or irritation assessed    I personally reviewed active problem list, medication list, allergies, family history, social history with the patient/caregiver today.   ROS  Ten systems reviewed and is negative except as mentioned in HPI   Objective  Vitals:   09/28/18 1427  BP: (!) 180/90  Pulse: 65  Resp: 16  Temp: (!) 97.3 F (36.3 C)  TempSrc: Temporal  SpO2: 99%  Weight: 159 lb 3.2 oz (72.2 kg)  Height: 5\' 2"  (1.575 m)    Body mass index is 29.12 kg/m.  Physical Exam  Constitutional: Patient appears well-developed and well-nourished. Overweight.  No distress.  HEENT: head atraumatic, normocephalic, pupils equal and reactive to light, neck supple, throat mild erythema, no lesions, oral mucosa is dry  Cardiovascular: Normal rate, regular rhythm and normal heart sounds.  No murmur heard. 2plus  BLE edema. Pulmonary/Chest: Effort normal and breath sounds normal. No respiratory distress. Abdominal: Soft.  There is no tenderness. Psychiatric: Patient has a normal mood and affect. behavior is normal. Judgment and thought content normal.  PHQ2/9: Depression screen [facility] 2/9 09/28/2018 09/16/2018 09/16/2018 09/08/2018 08/21/2018  Decreased Interest 0 0 0 1 2  Down, Depressed, Hopeless - 1 0 1 1  PHQ - 2 Score 0 1 0 2 3  Altered sleeping 0 3 0 3 3  Tired, decreased energy 1 3 0 3 1  Change in appetite 1 0 0 2 1  Feeling bad or failure about yourself  0 1 0 0 2  Trouble concentrating 0 1 0 1  1  Moving slowly or fidgety/restless 0 0 0 0 0  Suicidal thoughts 0 0 0 0 0  PHQ-9 Score 2 9 0 11 11  Difficult doing work/chores Not difficult at all Not difficult at all - Somewhat difficult -  Some recent data might be hidden    phq 9 is negative    Fall Risk: Fall Risk  09/28/2018 09/16/2018 09/08/2018 08/21/2018 03/12/2018  Falls in the past year? 0 0 0 0 0  Comment - - - - -  Number falls in past yr: 0 0 0 0 0  Comment - - - - -  Injury with Fall? 0 0 0 0 0  Risk for fall due to : - - - - -  Risk for fall due to: Comment - - - - -  Follow up - - Falls prevention discussed - -     Functional Status Survey: Is the patient deaf or have difficulty hearing?: No Does the patient have difficulty seeing, even when wearing glasses/contacts?: No Does the patient have difficulty concentrating, remembering, or making decisions?: No Does the patient have difficulty walking or climbing stairs?: Yes Does the patient have difficulty dressing or bathing?: No Does the patient have difficulty doing errands alone such as visiting a doctor's office or shopping?: No   Assessment & Plan  1. Uncontrolled hypertension  - hydrALAZINE (APRESOLINE) 25 MG tablet; Take 1 tablet (25 mg total) by mouth 3 (three) times daily.  Dispense: 90 tablet;  Refill: 0  Resume hydralazine and monitor bp follow up as scheduled

## 2018-09-28 NOTE — Telephone Encounter (Signed)
Patient came into the office today and reports having tremors and headaches after taking Losartan and Amlodipine. Please advise.

## 2018-10-01 ENCOUNTER — Other Ambulatory Visit: Payer: Self-pay

## 2018-10-01 ENCOUNTER — Ambulatory Visit: Payer: PPO

## 2018-10-01 VITALS — BP 128/62 | HR 59 | Resp 18

## 2018-10-01 DIAGNOSIS — I10 Essential (primary) hypertension: Secondary | ICD-10-CM

## 2018-10-01 DIAGNOSIS — M9901 Segmental and somatic dysfunction of cervical region: Secondary | ICD-10-CM | POA: Diagnosis not present

## 2018-10-01 DIAGNOSIS — G44219 Episodic tension-type headache, not intractable: Secondary | ICD-10-CM | POA: Diagnosis not present

## 2018-10-01 NOTE — Progress Notes (Signed)
Patient is here for a blood pressure check. Patient denies chest pain, palpitations, shortness of breath or visual disturbances. At previous visit blood pressure was 180/90 with a heart rate of 65. Today during nurse visit first check blood pressure was 128/62 and heart rate was 59. Patient brought her new blood pressure arm cuff meter to compare to our reading and it read 134/74 with a pulse of 59.  She does take any blood pressure medications and states her only side effect is constant headaches.   On her last visit 09/28/2018 was instructed to resume hydralazine 25 mg 1 tablet by mouth three times daily.

## 2018-10-03 NOTE — Progress Notes (Signed)
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  INTERVAL HISTORY: Patient returns to clinic today for further evaluation and consideration of cycle 7, day 8 of single agent gemcitabine.  She continues to have chronic shortness of breath, but otherwise feels well.  She denies any recent fevers or illnesses.  She has no neurologic complaints.  She denies any chest pain, cough, or hemoptysis.  She has occasional nausea, but denies any vomiting, constipation, or diarrhea.  She denies any abdominal pain or bloating.  She has no urinary complaints.  Patient offers no further specific complaints today.  REVIEW OF SYSTEMS:   Review of Systems  Constitutional: Positive for malaise/fatigue. Negative for fever and weight loss.  HENT: Negative.  Negative for sore throat.   Eyes: Negative.  Negative for blurred vision.  Respiratory: Positive for shortness of breath. Negative for cough.   Cardiovascular: Negative.  Negative for chest pain and leg swelling.  Gastrointestinal: Negative.  Negative for abdominal pain, blood in stool, constipation, diarrhea, melena, nausea and vomiting.  Genitourinary: Negative.  Negative for dysuria.  Musculoskeletal: Negative.  Negative for back pain, joint pain and neck pain.  Skin: Negative.  Negative for itching and rash.  Neurological: Positive for tingling, sensory change and weakness. Negative for focal weakness.    Psychiatric/Behavioral: Negative.  The patient is not nervous/anxious.      As per HPI. Otherwise, a complete review of systems is negative.  PAST MEDICAL HISTORY: Past Medical History:  Diagnosis Date   Allergic rhinitis, cause unspecified    Anxiety state, unspecified    Arthritis    Asthma    only when sick    Backache, unspecified    Bronchitis    hx of when get sick   Cancer [facility])    skin cancer , basal cell    Cancer ([place]) 11/2016   ovarian   Cervicalgia    CHF (congestive heart failure) (HCC)    Complication of anesthesia    Dermatophytosis of nail    Dysmetabolic syndrome X    Encounter for long-term (current) use of other medications    Esophageal reflux    Hypertension    Insomnia, unspecified    Leukocytosis, unspecified    Migraine without aura, without mention of intractable migraine without mention of status migrainosus    Other and unspecified hyperlipidemia    Other malaise and fatigue    Overweight(278.02)    Personal history of chemotherapy now   ovarian   PONV (postoperative nausea and vomiting)    Renal insufficiency    Spinal stenosis in cervical region    Symptomatic menopausal or female climacteric states    Unspecified disorder of skin and subcutaneous tissue    Unspecified vitamin D deficiency     PAST SURGICAL HISTORY: Past Surgical History:  Procedure Laterality Date   ABDOMINAL HYSTERECTOMY     ANTERIOR CERVICAL DECOMP/DISCECTOMY FUSION N/A 06/07/2015   Procedure: Cervical three - four and Cervical six- seven anterior cervical decompression with fusion interbody prosthesis plating and bonegraft;  Surgeon:  Newman Pies, MD;  Location: [place] [facility];  Service: Neurosurgery;  Laterality: N/A;  C34 and C67 anterior cervical decompression with fusion interbody prosthesis plating and bonegraft   BACK SURGERY     x2 Lower    DIALYSIS/PERMA CATHETER INSERTION N/A 08/17/2018   Procedure: DIALYSIS/PERMA CATHETER  INSERTION;  Surgeon: Algernon Huxley, MD;  Location: [place] [facility];  Service: Cardiovascular;  Laterality: N/A;   EVACUATION OF CERVICAL HEMATOMA N/A 06/14/2015   Procedure: EVACUATION OF CERVICAL HEMATOMA;  Surgeon: Newman Pies, MD;  Location: [place] [facility];  Service: Neurosurgery;  Laterality: N/A;   NECK SURGERY     x3   TONSILLECTOMY      FAMILY HISTORY: Family History  Problem Relation Age of Onset   Depression Mother    Migraines Mother    Dementia Father    Diabetes Father    Hyperlipidemia Father    Hyperlipidemia Brother    Hyperlipidemia Brother    Breast cancer Paternal Aunt        17s    ADVANCED DIRECTIVES (Y/N):  N  HEALTH MAINTENANCE: Social History   Tobacco Use   Smoking status: Former Smoker    Packs/day: 1.50    Years: 20.00    Pack years: 30.00    Types: Cigarettes    Start date: 03/19/1979    Quit date: 08/25/1999    Years since quitting: 19.1   Smokeless tobacco: Never Used   Tobacco comment: smoking cessation materials not required  Substance Use Topics   Alcohol use: Not Currently    Alcohol/week: 0.0 standard drinks   Drug use: No     Colonoscopy:  PAP:  Bone density:  Lipid panel:  Allergies  Allergen Reactions   Chlorhexidine Itching    Pt reports itching this AM post wipes w/ CHG, no redness or irritation assessed    Current Outpatient Medications  Medication Sig Dispense Refill   albuterol (PROVENTIL HFA;VENTOLIN HFA) 108 (90 Base) MCG/ACT inhaler Inhale 2 puffs into the lungs every 6 (six) hours as needed for wheezing or shortness of breath. 1 Inhaler 2   albuterol (PROVENTIL) (2.5 MG/3ML) 0.083% nebulizer solution Take 3 mLs (2.5 mg total) by nebulization every 6 (six) hours as needed for wheezing or shortness of breath. 75 mL 2   Ascorbic Acid (VITAMIN C PO) Take 1 tablet by mouth daily.      cetirizine (ZYRTEC) 10 MG tablet Take 10 mg by mouth daily.     Cholecalciferol (VITAMIN D-1000 MAX ST) 1000  UNITS tablet Take 1 tablet by mouth 2 (two) times daily.     Cyanocobalamin (B-12 PO) Take 1 tablet by mouth daily.      cyclobenzaprine (FLEXERIL) 10 MG tablet Take one tablet every 8 hours as needed 30 tablet 3   DULoxetine (CYMBALTA) 60 MG capsule Take 1 capsule (60 mg total) by mouth daily. 90 capsule 0   ferrous sulfate (IRON SUPPLEMENT) 325 (65 FE) MG tablet Take 1 tablet by mouth daily.     furosemide (LASIX) 80 MG tablet Take 1 tablet (80 mg total) by mouth 2 (two) times daily. 180 tablet 3   hydrALAZINE (APRESOLINE) 25 MG tablet Take 1 tablet (25 mg total) by mouth 3 (three) times daily. For BP if above 150/90 90 tablet 0   hydrOXYzine (ATARAX/VISTARIL) 10 MG tablet Take 1 tablet (10 mg total) by mouth every 6 (six) hours as needed. 120 tablet 0   ipratropium (ATROVENT HFA) 17 MCG/ACT inhaler Inhale 2 puffs into  the lungs every 6 (six) hours as needed.      isosorbide mononitrate (IMDUR) 30 MG 24 hr tablet Take 1 tablet (30 mg total) by mouth daily. 30 tablet 2   levothyroxine (SYNTHROID) 25 MCG tablet Take 1 tablet (25 mcg total) by mouth daily before breakfast. 30 tablet 2   lidocaine (XYLOCAINE) 2 % solution Use as directed 15 mLs in the mouth or throat as needed for mouth pain. 100 mL 0   Lysine 1000 MG TABS Take 1 tablet by mouth daily.      Magnesium Oxide 400 (240 Mg) MG TABS Take 400 mg by mouth 2 (two) times daily.      metoCLOPramide (REGLAN) 5 MG tablet Take 1 tablet (5 mg total) by mouth every 8 (eight) hours as needed for nausea or vomiting. 90 tablet 1   metoprolol succinate (TOPROL-XL) 50 MG 24 hr tablet Take 1 tablet (50 mg total) by mouth daily. Take with or immediately following a meal. 30 tablet 2   mometasone-formoterol (DULERA) 200-5 MCG/ACT AERO Inhale 2 puffs into the lungs 2 (two) times daily.     Multiple Vitamins-Minerals (MULTIVITAMIN PO) Take 1 tablet by mouth daily.      Omega-3 Fatty Acids (FISH OIL PO) Take 1 capsule by mouth 2 (two) times  daily.      ondansetron (ZOFRAN-ODT) 8 MG disintegrating tablet Take 1 tablet (8 mg total) by mouth every 8 (eight) hours as needed for nausea or vomiting. 60 tablet 3   polyethylene glycol powder (GLYCOLAX/MIRALAX) 17 GM/SCOOP powder Take 17 g by mouth daily. PRN     SUMAtriptan (IMITREX) 100 MG tablet May repeat in 2 hours if headache persists or recurs. 9 tablet 1   temazepam (RESTORIL) 30 MG capsule Take 1 capsule (30 mg total) by mouth at bedtime as needed for sleep. 30 capsule 0   fluticasone (FLONASE) 50 MCG/ACT nasal spray Place 2 sprays into both nostrils as needed. 48 g 1   oxyCODONE-acetaminophen (PERCOCET) 10-325 MG tablet Take 1 tablet by mouth every 6 (six) hours as needed for pain. 60 tablet 0   No current facility-administered medications for this visit.    Facility-Administered Medications Ordered in Other Visits  Medication Dose Route Frequency Provider Last Rate Last Dose   0.9 %  sodium chloride infusion   Intravenous Once Grayland Ormond, Kathlene November, MD       0.9 %  sodium chloride infusion   Intravenous Once Grayland Ormond, Kathlene November, MD       dexamethasone (DECADRON) 20 mg in sodium chloride 0.9 % 50 mL IVPB  20 mg Intravenous Once Lloyd Huger, MD       dexamethasone (DECADRON) injection 10 mg  10 mg Intravenous Once Lloyd Huger, MD       sodium chloride flush (NS) 0.9 % injection 10 mL  10 mL Intravenous PRN Lloyd Huger, MD   10 mL at 06/16/17 0854    OBJECTIVE: Vitals:   10/06/18 1339  BP: 119/66  Pulse: 78     Body mass index is 26.89 kg/m.    ECOG FS:1 - Symptomatic but completely ambulatory  General: Well-developed, well-nourished, no acute distress. Eyes: Pink conjunctiva, anicteric sclera. HEENT: Normocephalic, moist mucous membranes. Lungs: Clear to auscultation bilaterally. Heart: Regular rate and rhythm. No rubs, murmurs, or gallops. Abdomen: Soft, nontender, nondistended. No organomegaly noted, normoactive bowel  sounds. Musculoskeletal: No edema, cyanosis, or clubbing. Neuro: Alert, answering all questions appropriately. Cranial nerves grossly intact. Skin: No rashes or  petechiae noted. Psych: Normal affect.  LAB RESULTS:  Lab Results  Component Value Date   NA 138 10/06/2018   K 5.2 (H) 10/06/2018   CL 97 (L) 10/06/2018   CO2 32 10/06/2018   GLUCOSE 95 10/06/2018   BUN 18 10/06/2018   CREATININE 2.59 (H) 10/06/2018   CALCIUM 8.3 (L) 10/06/2018   PROT 5.8 (L) 10/06/2018   ALBUMIN 3.0 (L) 10/06/2018   AST 26 10/06/2018   ALT 26 10/06/2018   ALKPHOS 91 10/06/2018   BILITOT 0.8 10/06/2018   GFRNONAA 19 (L) 10/06/2018   GFRAA 22 (L) 10/06/2018    Lab Results  Component Value Date   WBC 4.7 10/06/2018   NEUTROABS 3.4 10/06/2018   HGB 10.0 (L) 10/06/2018   HCT 32.5 (L) 10/06/2018   MCV 106.2 (H) 10/06/2018   PLT 100 (L) 10/06/2018     STUDIES: Ct Abdomen Pelvis Wo Contrast  Result Date: 09/21/2018 CLINICAL DATA:  Nausea and vomiting. EXAM: CT ABDOMEN AND PELVIS WITHOUT CONTRAST TECHNIQUE: Multidetector CT imaging of the abdomen and pelvis was performed following the standard protocol without IV contrast. COMPARISON:  CT 08/06/2018. PET CT 05/07/2018 FINDINGS: Lower chest: Moderate bilateral pleural effusions with compressive atelectasis. Cardiomegaly. Hepatobiliary: No focal hepatic abnormality on noncontrast exam. Postcholecystectomy with unchanged biliary prominence. Pancreas: No ductal dilatation. Mild stranding in the upper abdomen, without discrete peripancreatic inflammation. Spleen: Normal in size without focal abnormality. Adrenals/Urinary Tract: No adrenal nodule. No hydronephrosis. Mild symmetric bilateral perinephric edema. No urolithiasis. Urinary bladder is nondistended. Stomach/Bowel: Stomach is distended with ingested contents. No gastric wall thickening. No small bowel dilatation or obvious inflammation. No obstruction. Appendix not definitively visualized. Large volume of  stool throughout the colon. No colonic wall thickening or inflammatory change. No abnormal rectal distention. Vascular/Lymphatic: Aortic atherosclerosis. No aneurysm. Limited assessment for adenopathy given ascites and Burgess contrast. No bulky abdominopelvic adenopathy. Reproductive: Status post hysterectomy. No adnexal masses. Other: Increase abdominopelvic ascites, no small to moderate. Increased mesenteric and generalized soft tissue edema from prior. Soft tissue nodularity in the left upper omentum, images 34 and 37, slight increase from prior. Questionable new omental nodularity anteriorly in the midline, image 46 series 3. Postsurgical change of the anterior abdominal wall. No free air. Small ventral abdominal wall hernia contains fat. Musculoskeletal: Postsurgical and degenerative change in the spine. There are no acute or suspicious osseous abnormalities. IMPRESSION: 1. Increased abdominopelvic ascites and generalized mesenteric and soft tissue edema. Slight increased soft tissue nodularity in the left upper omentum, possible new omental nodularity anteriorly. 2. Large colonic stool burden suggesting constipation. No bowel obstruction. Ingested material distends the stomach, if there is been no recent p.o. intake, question gastroparesis. 3. Moderate bilateral pleural effusions with compressive atelectasis. 4.  Aortic Atherosclerosis (ICD10-I70.0). Electronically Signed   By: Keith Rake M.D.   On: 09/21/2018 01:01    ONCOLOGY HISTORY: Patient underwent debulking surgery at [facility] on November 19, 2016. She received one infusion preoperative infusion of Keytruda on November 11, 2016 as part of a clinical trial.  She initiated adjuvant carboplatinum and Taxol on December 16, 2016.  This was continued through April 28, 2017 at which time Taxol was discontinued secondary to worsening neuropathy.  Patient then received single agent carboplatinum for additional 2 infusions.  She was then noted to  have progression of disease and was switched to Avastin and doxorubicin on August 18, 2017 this was discontinued on November 24, 2017 secondary to declining performance status.  Palliative gemcitabine was initiated on  February 20, 2018 which was then discontinued Jul 27, 2018 secondary to declining performance status and end-stage renal disease.  Gemcitabine was reinitiated on September 24, 2018.   ASSESSMENT: Stage IIIc high-grade serous ovarian carcinoma  PLAN:  1. Stage IIIc high-grade serous ovarian carcinoma: See oncology history as above. Her most recent CA-125 is decreased slightly to 730.0.  She expressed understanding that her options are limited particularly now that she has dialysis.  Plan to proceed with gemcitabine on days 1, 8, and 15 with a 22 off as previous.  Could also attempt Taxol or Taxotere, but there is concern that this would make her peripheral neuropathy worse.  BRCA and germline testing were negative on initial pathology.  Proceed with cycle 7, day 8 of single agent gemcitabine today.  Return to clinic in 1 week for further evaluation and consideration of cycle 7, day 15.  2.  Shortness of breath: Likely multifactorial.  Continue dialysis 3 days a week as planned on Tuesday, Thursday, and Saturday.  Patient also has significant pulmonary hypertension that is likely contributing.  Continue oxygen 24 hours/day as prescribed. 3.  Acute renal failure: Continue dialysis as above. 4.  Pain: Patient does not complain of this today.  Continue oxycodone as needed. 5.  Anemia: Hemoglobin decreased, but stable at 10.0. Okay to give Procrit with dialysis.  Continue to monitor closely and will transfuse if it falls below 7.0.  This will likely have to be done in conjunction with dialysis given her current fluid overload. 6.  Thrombocytopenia: Mild, proceed with treatment as above.   Lloyd Huger, MD   10/08/2018 6:25 AM

## 2018-10-06 ENCOUNTER — Inpatient Hospital Stay: Payer: PPO

## 2018-10-06 ENCOUNTER — Inpatient Hospital Stay (HOSPITAL_BASED_OUTPATIENT_CLINIC_OR_DEPARTMENT_OTHER): Payer: PPO | Admitting: Oncology

## 2018-10-06 ENCOUNTER — Other Ambulatory Visit: Payer: Self-pay | Admitting: *Deleted

## 2018-10-06 ENCOUNTER — Encounter: Payer: Self-pay | Admitting: Oncology

## 2018-10-06 ENCOUNTER — Other Ambulatory Visit: Payer: Self-pay

## 2018-10-06 VITALS — BP 119/66 | HR 78 | Wt 147.0 lb

## 2018-10-06 DIAGNOSIS — N186 End stage renal disease: Secondary | ICD-10-CM | POA: Diagnosis not present

## 2018-10-06 DIAGNOSIS — J90 Pleural effusion, not elsewhere classified: Secondary | ICD-10-CM | POA: Diagnosis not present

## 2018-10-06 DIAGNOSIS — R188 Other ascites: Secondary | ICD-10-CM

## 2018-10-06 DIAGNOSIS — D696 Thrombocytopenia, unspecified: Secondary | ICD-10-CM

## 2018-10-06 DIAGNOSIS — Z803 Family history of malignant neoplasm of breast: Secondary | ICD-10-CM

## 2018-10-06 DIAGNOSIS — I700 Atherosclerosis of aorta: Secondary | ICD-10-CM | POA: Diagnosis not present

## 2018-10-06 DIAGNOSIS — R202 Paresthesia of skin: Secondary | ICD-10-CM | POA: Diagnosis not present

## 2018-10-06 DIAGNOSIS — Z95828 Presence of other vascular implants and grafts: Secondary | ICD-10-CM

## 2018-10-06 DIAGNOSIS — M5441 Lumbago with sciatica, right side: Secondary | ICD-10-CM

## 2018-10-06 DIAGNOSIS — R5383 Other fatigue: Secondary | ICD-10-CM

## 2018-10-06 DIAGNOSIS — R208 Other disturbances of skin sensation: Secondary | ICD-10-CM | POA: Diagnosis not present

## 2018-10-06 DIAGNOSIS — I132 Hypertensive heart and chronic kidney disease with heart failure and with stage 5 chronic kidney disease, or end stage renal disease: Secondary | ICD-10-CM | POA: Diagnosis not present

## 2018-10-06 DIAGNOSIS — Z818 Family history of other mental and behavioral disorders: Secondary | ICD-10-CM

## 2018-10-06 DIAGNOSIS — K439 Ventral hernia without obstruction or gangrene: Secondary | ICD-10-CM

## 2018-10-06 DIAGNOSIS — C569 Malignant neoplasm of unspecified ovary: Secondary | ICD-10-CM

## 2018-10-06 DIAGNOSIS — Z992 Dependence on renal dialysis: Secondary | ICD-10-CM

## 2018-10-06 DIAGNOSIS — N179 Acute kidney failure, unspecified: Secondary | ICD-10-CM

## 2018-10-06 DIAGNOSIS — M549 Dorsalgia, unspecified: Secondary | ICD-10-CM

## 2018-10-06 DIAGNOSIS — Z83438 Family history of other disorder of lipoprotein metabolism and other lipidemia: Secondary | ICD-10-CM

## 2018-10-06 DIAGNOSIS — G629 Polyneuropathy, unspecified: Secondary | ICD-10-CM

## 2018-10-06 DIAGNOSIS — Z85828 Personal history of other malignant neoplasm of skin: Secondary | ICD-10-CM

## 2018-10-06 DIAGNOSIS — G8929 Other chronic pain: Secondary | ICD-10-CM

## 2018-10-06 DIAGNOSIS — R0602 Shortness of breath: Secondary | ICD-10-CM

## 2018-10-06 DIAGNOSIS — Z79899 Other long term (current) drug therapy: Secondary | ICD-10-CM

## 2018-10-06 DIAGNOSIS — D649 Anemia, unspecified: Secondary | ICD-10-CM

## 2018-10-06 DIAGNOSIS — Z87891 Personal history of nicotine dependence: Secondary | ICD-10-CM

## 2018-10-06 DIAGNOSIS — R531 Weakness: Secondary | ICD-10-CM

## 2018-10-06 DIAGNOSIS — Z9981 Dependence on supplemental oxygen: Secondary | ICD-10-CM

## 2018-10-06 DIAGNOSIS — A419 Sepsis, unspecified organism: Secondary | ICD-10-CM | POA: Diagnosis not present

## 2018-10-06 DIAGNOSIS — R0989 Other specified symptoms and signs involving the circulatory and respiratory systems: Secondary | ICD-10-CM

## 2018-10-06 DIAGNOSIS — M542 Cervicalgia: Secondary | ICD-10-CM

## 2018-10-06 DIAGNOSIS — G4700 Insomnia, unspecified: Secondary | ICD-10-CM

## 2018-10-06 DIAGNOSIS — I509 Heart failure, unspecified: Secondary | ICD-10-CM

## 2018-10-06 DIAGNOSIS — Z833 Family history of diabetes mellitus: Secondary | ICD-10-CM

## 2018-10-06 LAB — CBC WITH DIFFERENTIAL/PLATELET
Abs Immature Granulocytes: 0.04 10*3/uL (ref 0.00–0.07)
Basophils Absolute: 0 10*3/uL (ref 0.0–0.1)
Basophils Relative: 0 %
Eosinophils Absolute: 0.3 10*3/uL (ref 0.0–0.5)
Eosinophils Relative: 7 %
HCT: 32.5 % — ABNORMAL LOW (ref 36.0–46.0)
Hemoglobin: 10 g/dL — ABNORMAL LOW (ref 12.0–15.0)
Immature Granulocytes: 1 %
Lymphocytes Relative: 11 %
Lymphs Abs: 0.5 10*3/uL — ABNORMAL LOW (ref 0.7–4.0)
MCH: 32.7 pg (ref 26.0–34.0)
MCHC: 30.8 g/dL (ref 30.0–36.0)
MCV: 106.2 fL — ABNORMAL HIGH (ref 80.0–100.0)
Monocytes Absolute: 0.4 10*3/uL (ref 0.1–1.0)
Monocytes Relative: 8 %
Neutro Abs: 3.4 10*3/uL (ref 1.7–7.7)
Neutrophils Relative %: 73 %
Platelets: 100 10*3/uL — ABNORMAL LOW (ref 150–400)
RBC: 3.06 MIL/uL — ABNORMAL LOW (ref 3.87–5.11)
RDW: 15.3 % (ref 11.5–15.5)
WBC: 4.7 10*3/uL (ref 4.0–10.5)
nRBC: 0 % (ref 0.0–0.2)

## 2018-10-06 LAB — COMPREHENSIVE METABOLIC PANEL
ALT: 26 U/L (ref 0–44)
AST: 26 U/L (ref 15–41)
Albumin: 3 g/dL — ABNORMAL LOW (ref 3.5–5.0)
Alkaline Phosphatase: 91 U/L (ref 38–126)
Anion gap: 9 (ref 5–15)
BUN: 18 mg/dL (ref 8–23)
CO2: 32 mmol/L (ref 22–32)
Calcium: 8.3 mg/dL — ABNORMAL LOW (ref 8.9–10.3)
Chloride: 97 mmol/L — ABNORMAL LOW (ref 98–111)
Creatinine, Ser: 2.59 mg/dL — ABNORMAL HIGH (ref 0.44–1.00)
GFR calc Af Amer: 22 mL/min — ABNORMAL LOW (ref >60)
GFR calc non Af Amer: 19 mL/min — ABNORMAL LOW (ref >60)
Glucose, Bld: 95 mg/dL (ref 70–99)
Potassium: 5.2 mmol/L — ABNORMAL HIGH (ref 3.5–5.1)
Sodium: 138 mmol/L (ref 135–145)
Total Bilirubin: 0.8 mg/dL (ref 0.3–1.2)
Total Protein: 5.8 g/dL — ABNORMAL LOW (ref 6.5–8.1)

## 2018-10-06 LAB — SAMPLE TO BLOOD BANK

## 2018-10-06 MED ORDER — SODIUM CHLORIDE 0.9 % IV SOLN
Freq: Once | INTRAVENOUS | Status: AC
  Administered 2018-10-06: 14:00:00 via INTRAVENOUS
  Filled 2018-10-06: qty 250

## 2018-10-06 MED ORDER — OXYCODONE-ACETAMINOPHEN 10-325 MG PO TABS: 1.0000 | ORAL_TABLET | Freq: Four times a day (QID) | ORAL | 0 refills | Status: DC | PRN

## 2018-10-06 MED ORDER — SODIUM CHLORIDE 0.9% FLUSH
10.0000 mL | Freq: Once | INTRAVENOUS | Status: AC
  Administered 2018-10-06: 10 mL via INTRAVENOUS
  Filled 2018-10-06: qty 10

## 2018-10-06 MED ORDER — PROCHLORPERAZINE MALEATE 10 MG PO TABS
10.0000 mg | ORAL_TABLET | Freq: Once | ORAL | Status: AC
  Administered 2018-10-06: 14:00:00 10 mg via ORAL
  Filled 2018-10-06: qty 1

## 2018-10-06 MED ORDER — HEPARIN SOD (PORK) LOCK FLUSH 100 UNIT/ML IV SOLN
500.0000 [IU] | Freq: Once | INTRAVENOUS | Status: AC | PRN
  Administered 2018-10-06: 15:00:00 500 [IU]
  Filled 2018-10-06 (×2): qty 5

## 2018-10-06 MED ORDER — SODIUM CHLORIDE 0.9 % IV SOLN
1600.0000 mg | Freq: Once | INTRAVENOUS | Status: AC
  Administered 2018-10-06: 1600 mg via INTRAVENOUS
  Filled 2018-10-06: qty 26.3

## 2018-10-06 NOTE — Progress Notes (Signed)
Patient denies any concerns today.  

## 2018-10-07 LAB — CA 125: Cancer Antigen (CA) 125: 730 U/mL — ABNORMAL HIGH (ref 0.0–38.1)

## 2018-10-08 ENCOUNTER — Other Ambulatory Visit: Payer: Self-pay

## 2018-10-08 ENCOUNTER — Inpatient Hospital Stay
Admission: EM | Admit: 2018-10-08 | Discharge: 2018-10-14 | DRG: 871 | Disposition: A | Discharge status: Home / Self Care | Payer: PPO | Attending: Internal Medicine | Admitting: Internal Medicine

## 2018-10-08 ENCOUNTER — Emergency Department: Payer: PPO

## 2018-10-08 ENCOUNTER — Encounter: Payer: Self-pay | Admitting: Emergency Medicine

## 2018-10-08 DIAGNOSIS — F411 Generalized anxiety disorder: Secondary | ICD-10-CM | POA: Diagnosis not present

## 2018-10-08 DIAGNOSIS — I272 Pulmonary hypertension, unspecified: Secondary | ICD-10-CM | POA: Diagnosis not present

## 2018-10-08 DIAGNOSIS — D709 Neutropenia, unspecified: Secondary | ICD-10-CM | POA: Diagnosis not present

## 2018-10-08 DIAGNOSIS — D631 Anemia in chronic kidney disease: Secondary | ICD-10-CM | POA: Diagnosis present

## 2018-10-08 DIAGNOSIS — I480 Paroxysmal atrial fibrillation: Secondary | ICD-10-CM | POA: Diagnosis not present

## 2018-10-08 DIAGNOSIS — J90 Pleural effusion, not elsewhere classified: Secondary | ICD-10-CM

## 2018-10-08 DIAGNOSIS — Z8543 Personal history of malignant neoplasm of ovary: Secondary | ICD-10-CM | POA: Diagnosis not present

## 2018-10-08 DIAGNOSIS — Z79899 Other long term (current) drug therapy: Secondary | ICD-10-CM

## 2018-10-08 DIAGNOSIS — J449 Chronic obstructive pulmonary disease, unspecified: Secondary | ICD-10-CM | POA: Diagnosis not present

## 2018-10-08 DIAGNOSIS — Z85828 Personal history of other malignant neoplasm of skin: Secondary | ICD-10-CM

## 2018-10-08 DIAGNOSIS — I5043 Acute on chronic combined systolic (congestive) and diastolic (congestive) heart failure: Secondary | ICD-10-CM | POA: Diagnosis present

## 2018-10-08 DIAGNOSIS — T451X5A Adverse effect of antineoplastic and immunosuppressive drugs, initial encounter: Secondary | ICD-10-CM | POA: Diagnosis present

## 2018-10-08 DIAGNOSIS — A419 Sepsis, unspecified organism: Secondary | ICD-10-CM | POA: Diagnosis not present

## 2018-10-08 DIAGNOSIS — R079 Chest pain, unspecified: Secondary | ICD-10-CM

## 2018-10-08 DIAGNOSIS — D61810 Antineoplastic chemotherapy induced pancytopenia: Secondary | ICD-10-CM | POA: Diagnosis not present

## 2018-10-08 DIAGNOSIS — M79606 Pain in leg, unspecified: Secondary | ICD-10-CM

## 2018-10-08 DIAGNOSIS — Z9221 Personal history of antineoplastic chemotherapy: Secondary | ICD-10-CM

## 2018-10-08 DIAGNOSIS — Z90710 Acquired absence of both cervix and uterus: Secondary | ICD-10-CM

## 2018-10-08 DIAGNOSIS — Z981 Arthrodesis status: Secondary | ICD-10-CM

## 2018-10-08 DIAGNOSIS — C569 Malignant neoplasm of unspecified ovary: Secondary | ICD-10-CM | POA: Diagnosis not present

## 2018-10-08 DIAGNOSIS — J9611 Chronic respiratory failure with hypoxia: Secondary | ICD-10-CM | POA: Diagnosis not present

## 2018-10-08 DIAGNOSIS — Z20828 Contact with and (suspected) exposure to other viral communicable diseases: Secondary | ICD-10-CM | POA: Diagnosis not present

## 2018-10-08 DIAGNOSIS — N179 Acute kidney failure, unspecified: Secondary | ICD-10-CM | POA: Diagnosis not present

## 2018-10-08 DIAGNOSIS — R600 Localized edema: Secondary | ICD-10-CM | POA: Diagnosis not present

## 2018-10-08 DIAGNOSIS — Z992 Dependence on renal dialysis: Secondary | ICD-10-CM

## 2018-10-08 DIAGNOSIS — Z9889 Other specified postprocedural states: Secondary | ICD-10-CM

## 2018-10-08 DIAGNOSIS — Z888 Allergy status to other drugs, medicaments and biological substances status: Secondary | ICD-10-CM

## 2018-10-08 DIAGNOSIS — I517 Cardiomegaly: Secondary | ICD-10-CM | POA: Diagnosis not present

## 2018-10-08 DIAGNOSIS — N186 End stage renal disease: Secondary | ICD-10-CM | POA: Diagnosis present

## 2018-10-08 DIAGNOSIS — J811 Chronic pulmonary edema: Secondary | ICD-10-CM | POA: Diagnosis not present

## 2018-10-08 DIAGNOSIS — Z87891 Personal history of nicotine dependence: Secondary | ICD-10-CM

## 2018-10-08 DIAGNOSIS — G4700 Insomnia, unspecified: Secondary | ICD-10-CM | POA: Diagnosis not present

## 2018-10-08 DIAGNOSIS — Z7951 Long term (current) use of inhaled steroids: Secondary | ICD-10-CM

## 2018-10-08 DIAGNOSIS — I5033 Acute on chronic diastolic (congestive) heart failure: Secondary | ICD-10-CM

## 2018-10-08 DIAGNOSIS — N2581 Secondary hyperparathyroidism of renal origin: Secondary | ICD-10-CM | POA: Diagnosis present

## 2018-10-08 DIAGNOSIS — R0602 Shortness of breath: Secondary | ICD-10-CM | POA: Diagnosis not present

## 2018-10-08 DIAGNOSIS — J9811 Atelectasis: Secondary | ICD-10-CM | POA: Diagnosis not present

## 2018-10-08 DIAGNOSIS — I132 Hypertensive heart and chronic kidney disease with heart failure and with stage 5 chronic kidney disease, or end stage renal disease: Secondary | ICD-10-CM | POA: Diagnosis present

## 2018-10-08 DIAGNOSIS — E8881 Metabolic syndrome: Secondary | ICD-10-CM | POA: Diagnosis not present

## 2018-10-08 DIAGNOSIS — Z79890 Hormone replacement therapy (postmenopausal): Secondary | ICD-10-CM

## 2018-10-08 DIAGNOSIS — J309 Allergic rhinitis, unspecified: Secondary | ICD-10-CM | POA: Diagnosis not present

## 2018-10-08 DIAGNOSIS — K219 Gastro-esophageal reflux disease without esophagitis: Secondary | ICD-10-CM | POA: Diagnosis not present

## 2018-10-08 DIAGNOSIS — E039 Hypothyroidism, unspecified: Secondary | ICD-10-CM | POA: Diagnosis not present

## 2018-10-08 DIAGNOSIS — I120 Hypertensive chronic kidney disease with stage 5 chronic kidney disease or end stage renal disease: Secondary | ICD-10-CM | POA: Diagnosis not present

## 2018-10-08 DIAGNOSIS — R188 Other ascites: Secondary | ICD-10-CM | POA: Diagnosis not present

## 2018-10-08 DIAGNOSIS — R5081 Fever presenting with conditions classified elsewhere: Secondary | ICD-10-CM | POA: Diagnosis present

## 2018-10-08 DIAGNOSIS — R109 Unspecified abdominal pain: Secondary | ICD-10-CM

## 2018-10-08 DIAGNOSIS — I5023 Acute on chronic systolic (congestive) heart failure: Secondary | ICD-10-CM | POA: Diagnosis not present

## 2018-10-08 DIAGNOSIS — R509 Fever, unspecified: Secondary | ICD-10-CM | POA: Diagnosis not present

## 2018-10-08 DIAGNOSIS — Z9981 Dependence on supplemental oxygen: Secondary | ICD-10-CM

## 2018-10-08 LAB — URINALYSIS, COMPLETE (UACMP) WITH MICROSCOPIC
Bacteria, UA: NONE SEEN
Bilirubin Urine: NEGATIVE
Glucose, UA: NEGATIVE mg/dL
Ketones, ur: NEGATIVE mg/dL
Leukocytes,Ua: NEGATIVE
Nitrite: NEGATIVE
Protein, ur: 100 mg/dL — AB
Specific Gravity, Urine: 1.017 (ref 1.005–1.030)
Squamous Epithelial / HPF: NONE SEEN (ref 0–5)
pH: 5 (ref 5.0–8.0)

## 2018-10-08 LAB — CBC WITH DIFFERENTIAL/PLATELET
Abs Immature Granulocytes: 0.02 10*3/uL (ref 0.00–0.07)
Basophils Absolute: 0 10*3/uL (ref 0.0–0.1)
Basophils Relative: 0 %
Eosinophils Absolute: 0.1 10*3/uL (ref 0.0–0.5)
Eosinophils Relative: 5 %
HCT: 32.2 % — ABNORMAL LOW (ref 36.0–46.0)
Hemoglobin: 10 g/dL — ABNORMAL LOW (ref 12.0–15.0)
Immature Granulocytes: 1 %
Lymphocytes Relative: 7 %
Lymphs Abs: 0.2 10*3/uL — ABNORMAL LOW (ref 0.7–4.0)
MCH: 32.1 pg (ref 26.0–34.0)
MCHC: 31.1 g/dL (ref 30.0–36.0)
MCV: 103.2 fL — ABNORMAL HIGH (ref 80.0–100.0)
Monocytes Absolute: 0 10*3/uL — ABNORMAL LOW (ref 0.1–1.0)
Monocytes Relative: 1 %
Neutro Abs: 2.6 10*3/uL (ref 1.7–7.7)
Neutrophils Relative %: 86 %
Platelets: 147 10*3/uL — ABNORMAL LOW (ref 150–400)
RBC: 3.12 MIL/uL — ABNORMAL LOW (ref 3.87–5.11)
RDW: 15.8 % — ABNORMAL HIGH (ref 11.5–15.5)
Smear Review: UNDETERMINED
WBC: 3 10*3/uL — ABNORMAL LOW (ref 4.0–10.5)
nRBC: 0 % (ref 0.0–0.2)

## 2018-10-08 LAB — COMPREHENSIVE METABOLIC PANEL
ALT: 24 U/L (ref 0–44)
AST: 33 U/L (ref 15–41)
Albumin: 2.9 g/dL — ABNORMAL LOW (ref 3.5–5.0)
Alkaline Phosphatase: 95 U/L (ref 38–126)
Anion gap: 10 (ref 5–15)
BUN: 46 mg/dL — ABNORMAL HIGH (ref 8–23)
CO2: 24 mmol/L (ref 22–32)
Calcium: 8.2 mg/dL — ABNORMAL LOW (ref 8.9–10.3)
Chloride: 96 mmol/L — ABNORMAL LOW (ref 98–111)
Creatinine, Ser: 4.21 mg/dL — ABNORMAL HIGH (ref 0.44–1.00)
GFR calc Af Amer: 12 mL/min — ABNORMAL LOW (ref >60)
GFR calc non Af Amer: 11 mL/min — ABNORMAL LOW (ref >60)
Glucose, Bld: 162 mg/dL — ABNORMAL HIGH (ref 70–99)
Potassium: 5 mmol/L (ref 3.5–5.1)
Sodium: 130 mmol/L — ABNORMAL LOW (ref 135–145)
Total Bilirubin: 0.9 mg/dL (ref 0.3–1.2)
Total Protein: 5.8 g/dL — ABNORMAL LOW (ref 6.5–8.1)

## 2018-10-08 LAB — BRAIN NATRIURETIC PEPTIDE: B Natriuretic Peptide: 1214 pg/mL — ABNORMAL HIGH (ref 0.0–100.0)

## 2018-10-08 LAB — TROPONIN I (HIGH SENSITIVITY): Troponin I (High Sensitivity): 37 ng/L — ABNORMAL HIGH (ref <18)

## 2018-10-08 LAB — PROCALCITONIN: Procalcitonin: 17.84 ng/mL

## 2018-10-08 LAB — SARS CORONAVIRUS 2 BY RT PCR (HOSPITAL ORDER, PERFORMED IN ~~LOC~~ HOSPITAL LAB): SARS Coronavirus 2: NEGATIVE

## 2018-10-08 LAB — LACTIC ACID, PLASMA: Lactic Acid, Venous: 1.1 mmol/L (ref 0.5–1.9)

## 2018-10-08 MED ORDER — VANCOMYCIN HCL 500 MG IV SOLR
500.0000 mg | Freq: Once | INTRAVENOUS | Status: AC
  Administered 2018-10-08: 500 mg via INTRAVENOUS
  Filled 2018-10-08: qty 500

## 2018-10-08 MED ORDER — OXYCODONE-ACETAMINOPHEN 10-325 MG PO TABS: 1.0000 | ORAL_TABLET | Freq: Four times a day (QID) | ORAL | Status: DC | PRN

## 2018-10-08 MED ORDER — CYCLOBENZAPRINE HCL 10 MG PO TABS: 10.0000 mg | ORAL_TABLET | Freq: Three times a day (TID) | ORAL | Status: DC | PRN

## 2018-10-08 MED ORDER — SODIUM CHLORIDE 0.9% FLUSH: 3.0000 mL | INTRAVENOUS | Status: DC | PRN

## 2018-10-08 MED ORDER — METOCLOPRAMIDE HCL 10 MG PO TABS: 5.0000 mg | ORAL_TABLET | Freq: Three times a day (TID) | ORAL | Status: DC | PRN

## 2018-10-08 MED ORDER — SODIUM CHLORIDE 0.9 % IV SOLN
2.0000 g | Freq: Once | INTRAVENOUS | Status: AC
  Administered 2018-10-08: 2 g via INTRAVENOUS
  Filled 2018-10-08: qty 2

## 2018-10-08 MED ORDER — OXYCODONE-ACETAMINOPHEN 5-325 MG PO TABS
1.0000 | ORAL_TABLET | Freq: Four times a day (QID) | ORAL | Status: DC | PRN
  Administered 2018-10-08 – 2018-10-12 (×6): 1 via ORAL
  Filled 2018-10-08 (×6): qty 1

## 2018-10-08 MED ORDER — OXYCODONE HCL 5 MG PO TABS
5.0000 mg | ORAL_TABLET | Freq: Four times a day (QID) | ORAL | Status: DC | PRN
  Administered 2018-10-08 – 2018-10-12 (×7): 5 mg via ORAL
  Filled 2018-10-08 (×7): qty 1

## 2018-10-08 MED ORDER — SODIUM CHLORIDE 0.9 % IV SOLN
INTRAVENOUS | Status: DC | PRN
  Administered 2018-10-08 – 2018-10-09 (×3): 500 mL via INTRAVENOUS
  Administered 2018-10-10: 250 mL via INTRAVENOUS

## 2018-10-08 MED ORDER — FUROSEMIDE 10 MG/ML IJ SOLN
40.0000 mg | Freq: Two times a day (BID) | INTRAMUSCULAR | Status: DC
  Administered 2018-10-08: 40 mg via INTRAVENOUS
  Filled 2018-10-08: qty 4

## 2018-10-08 MED ORDER — VITAMIN B-12 1000 MCG PO TABS
1000.0000 ug | ORAL_TABLET | Freq: Every day | ORAL | Status: DC
  Administered 2018-10-08 – 2018-10-14 (×5): 1000 ug via ORAL
  Filled 2018-10-08 (×6): qty 1

## 2018-10-08 MED ORDER — HYDROXYZINE HCL 10 MG PO TABS
10.0000 mg | ORAL_TABLET | Freq: Four times a day (QID) | ORAL | Status: DC | PRN
  Administered 2018-10-08 – 2018-10-11 (×4): 10 mg via ORAL
  Filled 2018-10-08 (×7): qty 1

## 2018-10-08 MED ORDER — DULOXETINE HCL 30 MG PO CPEP
60.0000 mg | ORAL_CAPSULE | Freq: Every day | ORAL | Status: DC
  Administered 2018-10-08 – 2018-10-14 (×6): 60 mg via ORAL
  Filled 2018-10-08 (×3): qty 2
  Filled 2018-10-08 (×2): qty 1
  Filled 2018-10-08 (×3): qty 2

## 2018-10-08 MED ORDER — FERROUS SULFATE 325 (65 FE) MG PO TABS
325.0000 mg | ORAL_TABLET | Freq: Every day | ORAL | Status: DC
  Administered 2018-10-08 – 2018-10-14 (×5): 325 mg via ORAL
  Filled 2018-10-08 (×6): qty 1

## 2018-10-08 MED ORDER — MAGNESIUM OXIDE 400 (241.3 MG) MG PO TABS
400.0000 mg | ORAL_TABLET | Freq: Two times a day (BID) | ORAL | Status: DC
  Administered 2018-10-08 – 2018-10-14 (×12): 400 mg via ORAL
  Filled 2018-10-08 (×12): qty 1

## 2018-10-08 MED ORDER — IPRATROPIUM-ALBUTEROL 0.5-2.5 (3) MG/3ML IN SOLN
3.0000 mL | Freq: Four times a day (QID) | RESPIRATORY_TRACT | Status: DC
  Administered 2018-10-08 – 2018-10-14 (×22): 3 mL via RESPIRATORY_TRACT
  Filled 2018-10-08 (×25): qty 3

## 2018-10-08 MED ORDER — HEPARIN SODIUM (PORCINE) 5000 UNIT/ML IJ SOLN
5000.0000 [IU] | Freq: Three times a day (TID) | INTRAMUSCULAR | Status: DC
  Administered 2018-10-08 – 2018-10-11 (×8): 5000 [IU] via SUBCUTANEOUS
  Filled 2018-10-08 (×8): qty 1

## 2018-10-08 MED ORDER — SODIUM CHLORIDE 0.9% FLUSH
3.0000 mL | Freq: Two times a day (BID) | INTRAVENOUS | Status: DC
  Administered 2018-10-08 – 2018-10-14 (×11): 3 mL via INTRAVENOUS

## 2018-10-08 MED ORDER — VANCOMYCIN HCL IN DEXTROSE 1-5 GM/200ML-% IV SOLN
1000.0000 mg | Freq: Once | INTRAVENOUS | Status: AC
  Administered 2018-10-08: 1000 mg via INTRAVENOUS
  Filled 2018-10-08: qty 200

## 2018-10-08 MED ORDER — LORATADINE 10 MG PO TABS
10.0000 mg | ORAL_TABLET | Freq: Every day | ORAL | Status: DC
  Administered 2018-10-08 – 2018-10-14 (×6): 10 mg via ORAL
  Filled 2018-10-08 (×6): qty 1

## 2018-10-08 MED ORDER — HYDRALAZINE HCL 25 MG PO TABS
25.0000 mg | ORAL_TABLET | Freq: Three times a day (TID) | ORAL | Status: DC
  Administered 2018-10-08 – 2018-10-14 (×13): 25 mg via ORAL
  Filled 2018-10-08 (×15): qty 1

## 2018-10-08 MED ORDER — LABETALOL HCL 5 MG/ML IV SOLN
10.0000 mg | INTRAVENOUS | Status: DC | PRN
  Administered 2018-10-08: 5 mg via INTRAVENOUS
  Filled 2018-10-08: qty 4

## 2018-10-08 MED ORDER — ISOSORBIDE MONONITRATE ER 30 MG PO TB24
30.0000 mg | ORAL_TABLET | Freq: Every day | ORAL | Status: DC
  Administered 2018-10-08 – 2018-10-14 (×7): 30 mg via ORAL
  Filled 2018-10-08 (×7): qty 1

## 2018-10-08 MED ORDER — ACETAMINOPHEN 325 MG PO TABS
650.0000 mg | ORAL_TABLET | Freq: Four times a day (QID) | ORAL | Status: DC | PRN
  Administered 2018-10-08 – 2018-10-09 (×3): 650 mg via ORAL
  Filled 2018-10-08 (×4): qty 2

## 2018-10-08 MED ORDER — METOPROLOL SUCCINATE ER 50 MG PO TB24
50.0000 mg | ORAL_TABLET | Freq: Every day | ORAL | Status: DC
  Administered 2018-10-08 – 2018-10-11 (×3): 50 mg via ORAL
  Filled 2018-10-08 (×3): qty 1

## 2018-10-08 MED ORDER — ONDANSETRON 8 MG PO TBDP
8.0000 mg | ORAL_TABLET | Freq: Three times a day (TID) | ORAL | Status: DC | PRN
  Filled 2018-10-08: qty 1

## 2018-10-08 MED ORDER — ACETAMINOPHEN 500 MG PO TABS
1000.0000 mg | ORAL_TABLET | Freq: Once | ORAL | Status: AC
  Administered 2018-10-08: 1000 mg via ORAL
  Filled 2018-10-08: qty 2

## 2018-10-08 MED ORDER — SODIUM CHLORIDE 0.9 % IV SOLN
2.0000 g | INTRAVENOUS | Status: DC
  Administered 2018-10-08: 2 g via INTRAVENOUS
  Filled 2018-10-08: qty 2

## 2018-10-08 MED ORDER — POLYETHYLENE GLYCOL 3350 17 G PO PACK
17.0000 g | PACK | Freq: Every day | ORAL | Status: DC | PRN
  Administered 2018-10-13: 17 g via ORAL
  Filled 2018-10-08: qty 1

## 2018-10-08 MED ORDER — VITAMIN C 500 MG PO TABS
500.0000 mg | ORAL_TABLET | Freq: Every day | ORAL | Status: DC
  Administered 2018-10-08 – 2018-10-14 (×5): 500 mg via ORAL
  Filled 2018-10-08 (×6): qty 1

## 2018-10-08 MED ORDER — TEMAZEPAM 15 MG PO CAPS
30.0000 mg | ORAL_CAPSULE | Freq: Every evening | ORAL | Status: DC | PRN
  Administered 2018-10-08 – 2018-10-13 (×6): 30 mg via ORAL
  Filled 2018-10-08 (×6): qty 2

## 2018-10-08 MED ORDER — VANCOMYCIN HCL IN DEXTROSE 750-5 MG/150ML-% IV SOLN
750.0000 mg | INTRAVENOUS | Status: DC
  Administered 2018-10-09 – 2018-10-10 (×2): 750 mg via INTRAVENOUS
  Filled 2018-10-08 (×3): qty 150

## 2018-10-08 MED ORDER — LEVOTHYROXINE SODIUM 25 MCG PO TABS
25.0000 ug | ORAL_TABLET | Freq: Every day | ORAL | Status: DC
  Administered 2018-10-09 – 2018-10-14 (×6): 25 ug via ORAL
  Filled 2018-10-08 (×6): qty 1

## 2018-10-08 NOTE — ED Provider Notes (Addendum)
[facility] Emergency Department Provider Note  ____________________________________________   I have reviewed the triage vital signs and the nursing notes. Where available I have reviewed prior notes and, if possible and indicated, outside hospital notes.    HISTORY  Chief Complaint Shortness of Breath  Patient seen and evaluated during the coronavirus epidemic during a time with low staffing  HPI Jessica Burgess is a 63 y.o. female with a host of medical problems unfortunately including stage III ovarian cancer for which she is receiving chemotherapy, most recent chemotherapy was Tuesday, dialysis, Tuesday Thursday Saturday, still makes urine, most recent dialysis she had was Tuesday, pulmonary hypertension on chronic 2 L of oxygen, chronic shortness of breath despite oxygen therapy, anxiety, CHF and other medical problems listed below who presents today complaining of shortness of breath and fever.  Patient states she does have intermittent fevers associated with her dialysis, she is also short of breath though at this time.  She states she is been short of breath for years but worse over the last week.  Her last oncology note, on July 21, suggested at that time she was also complaining of her chronic shortness of breath.  Is difficult for the patient to distinguish exactly how much more short of breath she is than usual.  She is not coughing "much".  She does have chronic lower extremity edema from her CHF, however, she feels that her legs are more swollen bilaterally than normal.  No history of PE or DVT.  She does have she states 3 prior negative coronavirus test for similar symptomology.  She states she had some increased redness of breath over the last week and the fever started she believes last night.  No vomiting no diarrhea.  Does make urine.  Denies dysuria or hematuria.  No abdominal pain.  She states as long she sitting still her breathing is "okay" but when she  moves around it gets much worse.  Denies chest pain.    Past Medical History:  Diagnosis Date  . Allergic rhinitis, cause unspecified   . Anxiety state, unspecified   . Arthritis   . Asthma    only when sick   . Backache, unspecified   . Bronchitis    hx of when get sick  . Cancer ([place])    skin cancer , basal cell   . Cancer ([place]) 11/2016   ovarian  . Cervicalgia   . CHF (congestive heart failure) ([place])   . Complication of anesthesia   . Dermatophytosis of nail   . Dysmetabolic syndrome X   . Encounter for long-term (current) use of other medications   . Esophageal reflux   . Hypertension   . Insomnia, unspecified   . Leukocytosis, unspecified   . Migraine without aura, without mention of intractable migraine without mention of status migrainosus   . Other and unspecified hyperlipidemia   . Other malaise and fatigue   . Overweight(278.02)   . Personal history of chemotherapy now   ovarian  . PONV (postoperative nausea and vomiting)   . Renal insufficiency   . Spinal stenosis in cervical region   . Symptomatic menopausal or female climacteric states   . Unspecified disorder of skin and subcutaneous tissue   . Unspecified vitamin D deficiency     Patient Active Problem List   Diagnosis Date Noted  . CHF (congestive heart failure) ([place]) 08/28/2018  . Acute respiratory failure ([place])   . Anxiety 08/14/2018  . ESRD on dialysis ([place])   .  Acute CHF (congestive heart failure) ([place]) 08/07/2018  . Acute CHF ([place]) 08/06/2018  . 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 Mucositis due to chemotherapy 09/02/2017  . Goals of care, counseling/discussion 08/08/2017  . Hypothyroidism due to medication 07/28/2017  . Genetic testing 12/18/2016  . Migraines 12/01/2016  . Postoperative seroma of subcutaneous tissue after non-dermatologic procedure 12/01/2016  . Transaminitis 12/01/2016  . Anemia associated with acute blood loss 11/20/2016  . Ovarian cancer, unspecified laterality  ([place]) 11/12/2016  . 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 Examination of participant in clinical trial 11/01/2016  . Carcinomatosis ([place]) 11/01/2016  . Chronic right-sided low back pain with right-sided sciatica 02/02/2016  . Status post insertion of dialysis catheter ([place]) 08/01/2015  . Hematoma 06/14/2015  . Perennial allergic rhinitis 05/05/2015  . Generalized anxiety disorder 02/03/2015  . Back pain, chronic 08/25/2014  . Insomnia, persistent 08/25/2014  . Chronic cervical pain 08/25/2014  . Major depression, chronic ([place]) 08/25/2014  . Dyslipidemia 08/25/2014  . Gastro-esophageal reflux disease without esophagitis 08/25/2014  . H/O high risk medication treatment 08/25/2014  . Blood glucose elevated 08/25/2014  . Migraine without aura and without status migrainosus, not intractable 08/25/2014  . Climacteric 08/25/2014  . Dysmetabolic syndrome 37/16/9678  . Fungal infection of toenail 08/25/2014  . Obesity (BMI 30.0-34.9) 08/25/2014  . Vitamin D deficiency 08/25/2014  . Engages in travel abroad 08/25/2014  . Cervical radiculitis 07/23/2013  . Cervical disc disorder with radiculopathy 04/28/2013    Past Surgical History:  Procedure Laterality Date  . ABDOMINAL HYSTERECTOMY    . ANTERIOR CERVICAL DECOMP/DISCECTOMY FUSION N/A 06/07/2015   Procedure: Cervical three - four and Cervical six- seven anterior cervical decompression with fusion interbody prosthesis plating and bonegraft;  Surgeon: Newman Pies, MD;  Location: [facility];  Service: Neurosurgery;  Laterality: N/A;  C34 and C67 anterior cervical decompression with fusion interbody prosthesis plating and bonegraft  . BACK SURGERY     x2 Lower   . DIALYSIS/PERMA CATHETER INSERTION N/A 08/17/2018   Procedure: DIALYSIS/PERMA CATHETER INSERTION;  Surgeon: Algernon Huxley, MD;  Location: [facility];  Service: Cardiovascular;  Laterality: N/A;  . EVACUATION OF CERVICAL HEMATOMA N/A  06/14/2015   Procedure: EVACUATION OF CERVICAL HEMATOMA;  Surgeon: Newman Pies, MD;  Location: [facility];  Service: Neurosurgery;  Laterality: N/A;  . NECK SURGERY     x3  . TONSILLECTOMY      Prior to Admission medications   Medication Sig Start Date End Date Taking? Authorizing Provider  albuterol (PROVENTIL HFA;VENTOLIN HFA) 108 (90 Base) MCG/ACT inhaler Inhale 2 puffs into the lungs every 6 (six) hours as needed for wheezing or shortness of breath. 10/04/16   Hubbard Hartshorn, FNP  albuterol (PROVENTIL) (2.5 MG/3ML) 0.083% nebulizer solution Take 3 mLs (2.5 mg total) by nebulization every 6 (six) hours as needed for wheezing or shortness of breath. 08/18/18   Steele Sizer, MD  Ascorbic Acid (VITAMIN C PO) Take 1 tablet by mouth daily.     [provider]  cetirizine (ZYRTEC) 10 MG tablet Take 10 mg by mouth daily.    [provider]  Cholecalciferol (VITAMIN D-1000 MAX ST) 1000 UNITS tablet Take 1 tablet by mouth 2 (two) times daily.    [provider]  Cyanocobalamin (B-12 PO) Take 1 tablet by mouth daily.     [provider]  cyclobenzaprine (FLEXERIL) 10 MG tablet Take one tablet every 8 hours as needed 08/03/18  Lloyd Huger, MD  DULoxetine (CYMBALTA) 60 MG capsule Take 1 capsule (60 mg total) by mouth daily. 09/16/18   Steele Sizer, MD  ferrous sulfate (IRON SUPPLEMENT) 325 (65 FE) MG tablet Take 1 tablet by mouth daily.    [provider]  fluticasone (FLONASE) 50 MCG/ACT nasal spray Place 2 sprays into both nostrils as needed. 06/11/18 09/17/18  Steele Sizer, MD  furosemide (LASIX) 80 MG tablet Take 1 tablet (80 mg total) by mouth 2 (two) times daily. 08/28/18 11/26/18  End, Harrell Gave, MD  hydrALAZINE (APRESOLINE) 25 MG tablet Take 1 tablet (25 mg total) by mouth 3 (three) times daily. For BP if above 150/90 09/28/18   Steele Sizer, MD  hydrOXYzine (ATARAX/VISTARIL) 10 MG tablet Take 1 tablet (10 mg total) by mouth every 6  (six) hours as needed. 09/16/18   Steele Sizer, MD  ipratropium (ATROVENT HFA) 17 MCG/ACT inhaler Inhale 2 puffs into the lungs every 6 (six) hours as needed.     [provider]  isosorbide mononitrate (IMDUR) 30 MG 24 hr tablet Take 1 tablet (30 mg total) by mouth daily. 08/18/18   Gladstone Lighter, MD  levothyroxine (SYNTHROID) 25 MCG tablet Take 1 tablet (25 mcg total) by mouth daily before breakfast. 07/21/18   Borders, Kirt Boys, NP  lidocaine (XYLOCAINE) 2 % solution Use as directed 15 mLs in the mouth or throat as needed for mouth pain. 11/28/17   Jacquelin Hawking, NP  Lysine 1000 MG TABS Take 1 tablet by mouth daily.     [provider]  Magnesium Oxide 400 (240 Mg) MG TABS Take 400 mg by mouth 2 (two) times daily.     [provider]  metoCLOPramide (REGLAN) 5 MG tablet Take 1 tablet (5 mg total) by mouth every 8 (eight) hours as needed for nausea or vomiting. 09/21/18 09/21/19  Hinda Kehr, MD  metoprolol succinate (TOPROL-XL) 50 MG 24 hr tablet Take 1 tablet (50 mg total) by mouth daily. Take with or immediately following a meal. 08/18/18   Gladstone Lighter, MD  mometasone-formoterol [facility]) 200-5 MCG/ACT AERO Inhale 2 puffs into the lungs 2 (two) times daily.    [provider]  Multiple Vitamins-Minerals (MULTIVITAMIN PO) Take 1 tablet by mouth daily.     [provider]  Omega-3 Fatty Acids (FISH OIL PO) Take 1 capsule by mouth 2 (two) times daily.     [provider]  ondansetron (ZOFRAN-ODT) 8 MG disintegrating tablet Take 1 tablet (8 mg total) by mouth every 8 (eight) hours as needed for nausea or vomiting. 07/20/18   Borders, Kirt Boys, NP  oxyCODONE-acetaminophen (PERCOCET) 10-325 MG tablet Take 1 tablet by mouth every 6 (six) hours as needed for pain. 10/06/18   Lloyd Huger, MD  polyethylene glycol powder (GLYCOLAX/MIRALAX) 17 GM/SCOOP powder Take 17 g by mouth daily. PRN    [provider]  SUMAtriptan (IMITREX) 100  MG tablet May repeat in 2 hours if headache persists or recurs. 09/16/18   Steele Sizer, MD  temazepam (RESTORIL) 30 MG capsule Take 1 capsule (30 mg total) by mouth at bedtime as needed for sleep. 09/16/18   Steele Sizer, MD  prochlorperazine (COMPAZINE) 10 MG tablet Take 1 tablet (10 mg total) by mouth every 6 (six) hours as needed (Nausea or vomiting). 12/02/16 08/08/17  Lloyd Huger, MD    Allergies Chlorhexidine  Family History  Problem Relation Age of Onset  . Depression Mother   . Migraines Mother   .  Dementia Father   . Diabetes Father   . Hyperlipidemia Father   . Hyperlipidemia Brother   . Hyperlipidemia Brother   . Breast cancer Paternal Aunt        12s    Social History Social History   Tobacco Use  . Smoking status: Former Smoker    Packs/day: 1.50    Years: 20.00    Pack years: 30.00    Types: Cigarettes    Start date: 03/19/1979    Quit date: 08/25/1999    Years since quitting: 19.1  . Smokeless tobacco: Never Used  . Tobacco comment: smoking cessation materials not required  Substance Use Topics  . Alcohol use: Not Currently    Alcohol/week: 0.0 standard drinks  . Drug use: No    Review of Systems Constitutional: + fever/chills Eyes: No visual changes. ENT: No sore throat. No stiff neck no neck pain Cardiovascular: Denies chest pain. Respiratory: + shortness of breath. Gastrointestinal:   no vomiting.  No diarrhea.  No constipation. Genitourinary: Negative for dysuria. Musculoskeletal: + lower extremity swelling Skin: Negative for rash. Neurological: Negative for severe headaches, focal weakness or numbness.   ____________________________________________   PHYSICAL EXAM:  VITAL SIGNS: ED Triage Vitals  Enc Vitals Group     BP 10/08/18 0716 (!) 159/91     Pulse Rate 10/08/18 0716 96     Resp 10/08/18 0716 (!) 32     Temp 10/08/18 0716 (!) 102.5 F (39.2 C)     Temp Source 10/08/18 0716 Oral     SpO2 10/08/18 0716 97 %     Weight  10/08/18 0717 147 lb (66.7 kg)     Height 10/08/18 0717 5\' 2"  (1.575 m)     Head Circumference --      Peak Flow --      Pain Score 10/08/18 0717 10     Pain Loc --      Pain Edu? --      Excl. in [place]? --     Constitutional: Patient is alert and able to give me a history, speaks in full sentences, when she moves in the bed however she does appear to become winded. Eyes: Conjunctivae are normal Head: Atraumatic HEENT: No congestion/rhinnorhea. Mucous membranes are moist.  Oropharynx non-erythematous Neck:   Nontender with no meningismus, no masses, no stridor Cardiovascular: Normal rate, regular rhythm. Grossly normal heart sounds.  Good peripheral circulation. Respiratory: Normal respiratory effort.  No retractions.  Diminished in the bases but no rales or rhonchi appreciated on limited exam. Abdominal: Soft and nontender. No distention. No guarding no rebound Back:  There is no focal tenderness or step off.  there is no midline tenderness there are no lesions noted. there is no CVA tenderness Musculoskeletal: No lower extremity tenderness, no upper extremity tenderness. No joint effusions, no DVT signs strong distal pulses positive symmetric bilateral 2+ pitting edema Neurologic:  Normal speech and language. No gross focal neurologic deficits are appreciated.  Skin:  Skin is warm, dry and intact. No rash noted. Psychiatric: Mood and affect are somewhat anxious. Speech and behavior are normal.  ____________________________________________   LABS (all labs ordered are listed, but only abnormal results are displayed)  Labs Reviewed  SARS CORONAVIRUS 2 (HOSPITAL ORDER, [facility])  CULTURE, BLOOD (ROUTINE X 2)  CULTURE, BLOOD (ROUTINE X 2)  URINE CULTURE  LACTIC ACID, PLASMA  LACTIC ACID, PLASMA  COMPREHENSIVE METABOLIC PANEL  CBC WITH DIFFERENTIAL/PLATELET  URINALYSIS, ROUTINE W REFLEX MICROSCOPIC  BRAIN NATRIURETIC PEPTIDE  URINALYSIS, COMPLETE (UACMP)  WITH MICROSCOPIC  TROPONIN I (HIGH SENSITIVITY)    Pertinent labs  results that were available during my care of the patient were reviewed by me and considered in my medical decision making (see chart for details). ____________________________________________  EKG  I personally interpreted any EKGs ordered by me or triage Sinus rhythm rate 94 bpm normal axis no acute ST elevation depression ____________________________________________  RADIOLOGY  Pertinent labs & imaging results that were available during my care of the patient were reviewed by me and considered in my medical decision making (see chart for details). If possible, patient and/or family made aware of any abnormal findings.  No results found. ____________________________________________    PROCEDURES  Procedure(s) performed: None  Procedures  Critical Care performed: None  ____________________________________________   INITIAL IMPRESSION / ASSESSMENT AND PLAN / ED COURSE  Pertinent labs & imaging results that were available during my care of the patient were reviewed by me and considered in my medical decision making (see chart for details).  Patient here with increasing shortness of breath, which is most likely multifactorial.  She also has a fever.  Differential is quite broad for this patient unfortunately.  She has baseline CHF, she has baseline renal failure on dialysis, she has baseline pulmonary hypertension on oxygen, anyone of these or a combination of them can cause her to be more short of breath, she has a fever as well which certainly could be because she has coronavirus which we will check for also could be any other possible source of fever we will look for urinalysis will check urine culture get chest x-ray blood cultures etc.  Patient is immunocompromise so we will begin empiric antibiotics.  Blood pressure is quite good heart rate is good I am really reluctant to give her a large amount of IV fluid  because she obviously has some evidence of significant failure and her dialysis not yet occurred today.  I think that I can certainly make her worse if I am aggressive with IV fluids.  We will give her antipyretics.  Patient will require admission.  I do not think she requires BiPAP at this time but will watch her closely to see if she progresses.    ____________________________________________   FINAL CLINICAL IMPRESSION(S) / ED DIAGNOSES  Final diagnoses:  None      This chart was dictated using voice recognition software.  Despite best efforts to proofread,  errors can occur which can change meaning.      Schuyler Amor, MD 10/08/18 [id number]    Schuyler Amor, MD 10/08/18 [phone]

## 2018-10-08 NOTE — ED Notes (Signed)
Per Dr Burlene Arnt no need to recollect a lactic or a troponin - VO to d/c

## 2018-10-08 NOTE — ED Notes (Signed)
Attempted to call report - requested this nurse to call back in 5 minutes

## 2018-10-08 NOTE — Progress Notes (Signed)
PHARMACY -  BRIEF ANTIBIOTIC NOTE   Pharmacy has received consult(s) for Vancomycin and Cefepime from an ED provider.  The patient's profile has been reviewed for ht/wt/allergies/indication/available labs.    One time order(s) placed for Vancomycin and Cefepime by ED provider  Further antibiotics/pharmacy consults should be ordered by admitting physician if indicated.                       Thank you, Vira Blanco 10/08/2018  9:28 AM

## 2018-10-08 NOTE — ED Notes (Signed)
Pt assisted to toilet to void - she was uable to give sample so in and out cath performed with Denton Ar RN as 2nd

## 2018-10-08 NOTE — ED Notes (Signed)
Report given to [employer]

## 2018-10-08 NOTE — Progress Notes (Signed)
Pharmacy Antibiotic Note  ASA FATH is a 63 y.o. female admitted on 10/08/2018 with sepsis.  Pharmacy has been consulted for Vancomycin and Cefepime dosing. Patient has ESRD and is on dialysis on a Tuesday, Thursday, Saturday schedule. Last dialysis session was on Tuesday 7/21.  Plan: Cefepime 2g IV after each HD session  Vancomycin 1500mg  IV loading dose followed by Vancomycin 750mg  IV after each HD session, will check a trough level prior to 3rd HD session.  Height: 5\' 2"  (157.5 cm) Weight: 147 lb (66.7 kg) IBW/kg (Calculated) : 50.1  Temp (24hrs), Avg:102.5 F (39.2 C), Min:102.5 F (39.2 C), Max:102.5 F (39.2 C)  Recent Labs  Lab 10/06/18 1312 10/06/18 1313 10/08/18 0731  WBC 4.7  --  3.0*  CREATININE  --  2.59* 4.21*  LATICACIDVEN  --   --  1.1    Estimated Creatinine Clearance: 12.2 mL/min (A) (by C-G formula based on SCr of 4.21 mg/dL (H)).    Allergies  Allergen Reactions  . Chlorhexidine Itching    Pt reports itching this AM post wipes w/ CHG, no redness or irritation assessed    Antimicrobials this admission: Vancomycin 7/23 >>  Cefepime 7/23 >>   Thank you for allowing pharmacy to be a part of this patient's care.  Paulina Fusi, PharmD, BCPS 10/08/2018 11:26 AM

## 2018-10-08 NOTE — H&P (Addendum)
[facility] NAME: Jessica Burgess    MR#:  [id number]  DATE OF BIRTH:  [birth year]  DATE OF ADMISSION:  10/08/2018  PRIMARY CARE PHYSICIAN: Steele Sizer, MD   REQUESTING/REFERRING PHYSICIAN: Charlotte Crumb, MD  CHIEF COMPLAINT:   Chief Complaint  Patient presents with   Shortness of Breath    HISTORY OF PRESENT ILLNESS:   63 year old female with multiple past medical history including stage III ovarian cancer on chemotherapy, last chemo on Tuesday, ESRD on hemodialysis Tuesday, Thursday and Saturday, CHF, COPD with chronic oxygen use, hypothyroidism, and hypertension presenting to the ED with chief complaints of shortness of breath, fevers and chills.  Patient reports progressive worsening of her symptoms for the past few months.  She noticed worsening bilateral lower extremity edema and cough without associated chest pain.  Patient is noted to have difficulty sleeping in complete sentences and not able to give adequate history due to shortness of breath.  On arrival to the ED, she was febrile temp of 102.5 with blood pressure 159/91 mm Hg and pulse rate 92-101 beats/min. There were no focal neurological deficits; she was alert and oriented x4.  Initial labs revealed lactic 1.1, sodium 130, WBC 3.0, platelets 147, BNP 1214, troponin 37, procalcitonin 17.4.  Chest x-ray showed mild congestive heart failure with slightly increased bibasilar atelectasis.  Due to her immunocompromised status she was started on empiric antibiotics with gentle IV fluids.  PAST MEDICAL HISTORY:   Past Medical History:  Diagnosis Date   Allergic rhinitis, cause unspecified    Anxiety state, unspecified    Arthritis    Asthma    only when sick    Backache, unspecified    Bronchitis    hx of when get sick   Cancer [facility])    skin cancer , basal cell    Cancer ([place]) 11/2016   ovarian   Cervicalgia    CHF (congestive heart failure) (HCC)     Complication of anesthesia    Dermatophytosis of nail    Dysmetabolic syndrome X    Encounter for long-term (current) use of other medications    Esophageal reflux    Hypertension    Insomnia, unspecified    Leukocytosis, unspecified    Migraine without aura, without mention of intractable migraine without mention of status migrainosus    Other and unspecified hyperlipidemia    Other malaise and fatigue    Overweight(278.02)    Personal history of chemotherapy now   ovarian   PONV (postoperative nausea and vomiting)    Renal insufficiency    Spinal stenosis in cervical region    Symptomatic menopausal or female climacteric states    Unspecified disorder of skin and subcutaneous tissue    Unspecified vitamin D deficiency     PAST SURGICAL HISTORY:   Past Surgical History:  Procedure Laterality Date   ABDOMINAL HYSTERECTOMY     ANTERIOR CERVICAL DECOMP/DISCECTOMY FUSION N/A 06/07/2015   Procedure: Cervical three - four and Cervical six- seven anterior cervical decompression with fusion interbody prosthesis plating and bonegraft;  Surgeon: Newman Pies, MD;  Location: [place] [facility];  Service: Neurosurgery;  Laterality: N/A;  C34 and C67 anterior cervical decompression with fusion interbody prosthesis plating and bonegraft   BACK SURGERY     x2 Lower    DIALYSIS/PERMA CATHETER INSERTION N/A 08/17/2018   Procedure: DIALYSIS/PERMA CATHETER INSERTION;  Surgeon: Algernon Huxley, MD;  Location: [place] [facility];  Service: Cardiovascular;  Laterality: N/A;   EVACUATION OF CERVICAL HEMATOMA N/A 06/14/2015   Procedure: EVACUATION OF CERVICAL HEMATOMA;  Surgeon: Newman Pies, MD;  Location: [place] [facility];  Service: Neurosurgery;  Laterality: N/A;   NECK SURGERY     x3   TONSILLECTOMY      SOCIAL HISTORY:   Social History   Tobacco Use   Smoking status: Former Smoker    Packs/day: 1.50    Years: 20.00    Pack years: 30.00    Types: Cigarettes     Start date: 03/19/1979    Quit date: 08/25/1999    Years since quitting: 19.1   Smokeless tobacco: Never Used   Tobacco comment: smoking cessation materials not required  Substance Use Topics   Alcohol use: Not Currently    Alcohol/week: 0.0 standard drinks    FAMILY HISTORY:   Family History  Problem Relation Age of Onset   Depression Mother    Migraines Mother    Dementia Father    Diabetes Father    Hyperlipidemia Father    Hyperlipidemia Brother    Hyperlipidemia Brother    Breast cancer Paternal Aunt        28s    DRUG ALLERGIES:   Allergies  Allergen Reactions   Chlorhexidine Itching    Pt reports itching this AM post wipes w/ CHG, no redness or irritation assessed    REVIEW OF SYSTEMS:   Review of Systems  Constitutional: Positive for chills and fever. Negative for malaise/fatigue and weight loss.  HENT: Negative for congestion, hearing loss and sore throat.   Eyes: Negative for blurred vision and double vision.  Respiratory: Positive for cough, shortness of breath and wheezing.   Cardiovascular: Positive for leg swelling. Negative for chest pain, palpitations and orthopnea.  Gastrointestinal: Negative for abdominal pain, diarrhea, nausea and vomiting.  Genitourinary: Negative for dysuria and urgency.  Musculoskeletal: Positive for back pain. Negative for myalgias.  Skin: Negative for rash.  Neurological: Negative for dizziness, sensory change, speech change, focal weakness and headaches.  Psychiatric/Behavioral: Negative for depression.    MEDICATIONS AT HOME:   Prior to Admission medications   Medication Sig Start Date End Date Taking? Authorizing Provider  albuterol (PROVENTIL HFA;VENTOLIN HFA) 108 (90 Base) MCG/ACT inhaler Inhale 2 puffs into the lungs every 6 (six) hours as needed for wheezing or shortness of breath. 10/04/16  Yes Hubbard Hartshorn, FNP  albuterol (PROVENTIL) (2.5 MG/3ML) 0.083% nebulizer solution Take 3 mLs (2.5 mg total) by  nebulization every 6 (six) hours as needed for wheezing or shortness of breath. 08/18/18  Yes Sowles, Drue Stager, MD  cetirizine (ZYRTEC) 10 MG tablet Take 10 mg by mouth daily.   Yes [provider]  Cholecalciferol (VITAMIN D-1000 MAX ST) 1000 UNITS tablet Take 1,000 Units by mouth 2 (two) times daily.    Yes [provider]  cyclobenzaprine (FLEXERIL) 10 MG tablet Take one tablet every 8 hours as needed Patient taking differently: Take 10 mg by mouth every 8 (eight) hours as needed for muscle spasms.  08/03/18  Yes Lloyd Huger, MD  DULoxetine (CYMBALTA) 60 MG capsule Take 1 capsule (60 mg total) by mouth daily. 09/16/18  Yes Sowles, Drue Stager, MD  ferrous sulfate (IRON SUPPLEMENT) 325 (65 FE) MG tablet Take 1 tablet by mouth daily.   Yes [provider]  furosemide (LASIX) 80 MG tablet Take 1 tablet (80 mg total) by mouth 2 (two) times daily. 08/28/18 11/26/18 Yes End, Harrell Gave, MD  hydrALAZINE (APRESOLINE) 25 MG tablet  Take 1 tablet (25 mg total) by mouth 3 (three) times daily. For BP if above 150/90 09/28/18  Yes Sowles, Drue Stager, MD  hydrOXYzine (ATARAX/VISTARIL) 10 MG tablet Take 1 tablet (10 mg total) by mouth every 6 (six) hours as needed. Patient taking differently: Take 10 mg by mouth every 6 (six) hours as needed for itching or anxiety.  09/16/18  Yes Sowles, Drue Stager, MD  ipratropium (ATROVENT HFA) 17 MCG/ACT inhaler Inhale 2 puffs into the lungs every 6 (six) hours as needed for wheezing.    Yes [provider]  isosorbide mononitrate (IMDUR) 30 MG 24 hr tablet Take 1 tablet (30 mg total) by mouth daily. 08/18/18  Yes Gladstone Lighter, MD  levothyroxine (SYNTHROID) 25 MCG tablet Take 1 tablet (25 mcg total) by mouth daily before breakfast. 07/21/18  Yes Borders, Kirt Boys, NP  lidocaine (XYLOCAINE) 2 % solution Use as directed 15 mLs in the mouth or throat as needed for mouth pain. 11/28/17  Yes Burns, Wandra Feinstein, NP  Lysine 1000 MG TABS Take 1,000 mg by mouth  daily.    Yes [provider]  Magnesium Oxide 400 (240 Mg) MG TABS Take 400 mg by mouth 2 (two) times daily.    Yes [provider]  metoCLOPramide (REGLAN) 5 MG tablet Take 1 tablet (5 mg total) by mouth every 8 (eight) hours as needed for nausea or vomiting. 09/21/18 09/21/19 Yes Hinda Kehr, MD  metoprolol succinate (TOPROL-XL) 50 MG 24 hr tablet Take 1 tablet (50 mg total) by mouth daily. Take with or immediately following a meal. 08/18/18  Yes Gladstone Lighter, MD  mometasone-formoterol (DULERA) 200-5 MCG/ACT AERO Inhale 2 puffs into the lungs 2 (two) times daily.   Yes [provider]  Multiple Vitamins-Minerals (MULTIVITAMIN PO) Take 1 tablet by mouth daily.    Yes [provider]  Omega-3 Fatty Acids (FISH OIL) 1000 MG CAPS Take 1,000 capsules by mouth 2 (two) times daily.    Yes [provider]  ondansetron (ZOFRAN-ODT) 8 MG disintegrating tablet Take 1 tablet (8 mg total) by mouth every 8 (eight) hours as needed for nausea or vomiting. 07/20/18  Yes Borders, Kirt Boys, NP  oxyCODONE-acetaminophen (PERCOCET) 10-325 MG tablet Take 1 tablet by mouth every 6 (six) hours as needed for pain. 10/06/18  Yes Lloyd Huger, MD  polyethylene glycol powder (GLYCOLAX/MIRALAX) 17 GM/SCOOP powder Take 17 g by mouth daily as needed for mild constipation or moderate constipation.    Yes [provider]  SUMAtriptan (IMITREX) 100 MG tablet May repeat in 2 hours if headache persists or recurs. Patient taking differently: Take 100 mg by mouth daily as needed for migraine. May repeat in 2 hours if headache persists or recurs. 09/16/18  Yes Sowles, Drue Stager, MD  temazepam (RESTORIL) 30 MG capsule Take 1 capsule (30 mg total) by mouth at bedtime as needed for sleep. 09/16/18  Yes Sowles, Drue Stager, MD  vitamin B-12 (CYANOCOBALAMIN) 1000 MCG tablet Take 1,000 mcg by mouth daily.   Yes [provider]  vitamin C (ASCORBIC ACID) 500 MG tablet Take 500 mg by  mouth daily.   Yes [provider]  fluticasone (FLONASE) 50 MCG/ACT nasal spray Place 2 sprays into both nostrils as needed. 06/11/18 09/17/18  Steele Sizer, MD  prochlorperazine (COMPAZINE) 10 MG tablet Take 1 tablet (10 mg total) by mouth every 6 (six) hours as needed (Nausea or vomiting). 12/02/16 08/08/17  Lloyd Huger, MD      VITAL SIGNS:  Blood pressure Marland Kitchen)  141/74, pulse 73, temperature (!) 102.5 F (39.2 C), temperature source Oral, resp. rate (!) 23, height 5\' 2"  (1.575 m), weight 66.7 kg, SpO2 98 %.  PHYSICAL EXAMINATION:   Physical Exam  GENERAL:  63 y.o.-year-old patient lying in the bed with no acute distress.  EYES: Pupils equal, round, reactive to light and accommodation. No scleral icterus. Extraocular muscles intact.  HEENT: Head atraumatic, normocephalic. Oropharynx and nasopharynx clear.  NECK:  Supple, no jugular venous distention. No thyroid enlargement, no tenderness.  LUNGS: Decreased breath sounds bilaterally, mild wheezing, rales,rhonchi or crepitation. No use of accessory muscles of respiration.  CARDIOVASCULAR: S1, S2 normal. murmur present, no rubs, or gallops.  ABDOMEN: Soft, nontender, nondistended. Bowel sounds present. No organomegaly or mass.  EXTREMITIES: Bilateral pitting edema of lower extremities.  Cyanosis, or clubbing. No rash or lesions. + pedal pulses MUSCULOSKELETAL: Normal bulk, and power was 5+ grip and elbow, knee, and ankle flexion and extension bilaterally.  NEUROLOGIC:Alert and oriented x 3. CN 2-12 intact. Sensation to light touch and cold stimuli intact bilaterally.  Babinski is downgoing. DTR's (biceps, patellar, and achilles) 2+ and symmetric throughout. Gait not tested due to safety concern. PSYCHIATRIC: The patient is alert and oriented x 3.  SKIN: No obvious rash, lesion, or ulcer.   DATA REVIEWED:  LABORATORY PANEL:   CBC Recent Labs  Lab 10/08/18 0731  WBC 3.0*  HGB 10.0*  HCT 32.2*  PLT 147*    ------------------------------------------------------------------------------------------------------------------  Chemistries  Recent Labs  Lab 10/08/18 0731  NA 130*  K 5.0  CL 96*  CO2 24  GLUCOSE 162*  BUN 46*  CREATININE 4.21*  CALCIUM 8.2*  AST 33  ALT 24  ALKPHOS 95  BILITOT 0.9   ------------------------------------------------------------------------------------------------------------------  Cardiac Enzymes No results for input(s): TROPONINI in the last 168 hours. ------------------------------------------------------------------------------------------------------------------  RADIOLOGY:  Dg Chest Port 1 View  Result Date: 10/08/2018 CLINICAL DATA:  Shortness of breath. EXAM: PORTABLE CHEST 1 VIEW COMPARISON:  Chest x-ray dated August 28, 2018. FINDINGS: Unchanged right chest wall port catheter and tunneled left internal jugular dialysis catheter. Stable cardiomegaly and pulmonary vascular congestion. Unchanged small bilateral pleural effusions. Slightly increased bibasilar atelectasis. No pneumothorax. No acute osseous abnormality. IMPRESSION: 1. Mild congestive heart failure, similar to prior study. Electronically Signed   By: Titus Dubin M.D.   On: 10/08/2018 08:07    EKG:  EKG: normal EKG, normal sinus rhythm, unchanged from previous tracings. Vent. rate 96 BPM PR interval * ms QRS duration 77 ms QT/QTc 336/425 ms P-R-T axes 68 62 77 IMPRESSION AND PLAN:   63 y.o. female with multiple past medical history including stage III ovarian cancer on chemotherapy, last chemo on Tuesday, ESRD on hemodialysis Tuesday, Thursday and Saturday, CHF, COPD with chronic oxygen use, hypothyroidism, and hypertension presenting to the ED with chief complaints of shortness of breath, fevers and chills.   1. Sepsis - Patient meets SIRS criteria,Heart Rate 101 beats/minute, Respiratory Rate 32 breaths/minute,Temperature 102.5 Unclear source neutropenic Fever? - Admit to  telemetry - Neutropenic precautions - Procalcitonin elevated - UA negative for UTI - Chest x-ray with mild congestion, may repeat in the am? - Follow-up blood+urine cultures. Need cultures from port a cath - Start Empiric abx with Cefepime 2g IV q8h + vancomycin - If persistent or recurrent fever >4 days without clear source, consider   - CT Thorax to assess for pulmonary nodules (likely Aspergillus mold) + ID consult?  2. Acute on chronic systolic HF-mild volume overload, patient presenting with  shortness of breath and bilateral lower extremity edema, BNP elevated - Last Echo 08/2018 with EF 50 to 55% - Hold Lasix in the setting of sepsis and AKI, may need to restart tomorrow if kidney function improves - Continue to Imdur + metoprolol  3. COPD - chronic home oxygen use -Duonebs PRN -Continue supplemental oxygen  4.  Acute on chronic kidney failure- Hx of ESRD on HD Tuesday, Thursday, Saturday -BUN/creatinine elevated -Nephrology consult for possible dialysis.  Message sent to Dr. Holley Raring via haiku  5. stage III ovarian cancer on chemotherapy -Last chemo on Tuesday -Follows with oncology  6.  Hypertension-BP elevated -Continue BP meds -PRN labetalol and hydralazine  7.  Hypothyroidism-continue Synthroid   All the records are reviewed and case discussed with ED provider. Management plans discussed with the patient, family and they are in agreement.  CODE STATUS: FULL  TOTAL TIME TAKING CARE OF THIS PATIENT: 50 minutes.    on 10/08/2018 at 11:29 AM  Rufina Falco, DNP, FNP-BC [facility] Nurse Practitioner Between 7am to 6pm - Pager [phone]  After 6pm go to [email or web address] - password [email or web address]  Office  [phone]  CC: Primary care physician; Steele Sizer, MD

## 2018-10-08 NOTE — ED Notes (Signed)
Pt c/o SHOB for the last few months that got increasingly worse yesterday - she c/o bilat ext edema that has also been present for months - pt is a poor historian but is A&O x4 - pt desat with any exertion - she reports home O2 at 2L but has increased to 3-4L over the last 24 hours

## 2018-10-08 NOTE — Progress Notes (Signed)
Ch visited for spiritual support OR. Pt was alert yet presented to have labored breathing even while on O2. It was a challenge for pt to hold a conversation but she did share how she was having a hard time with her CHF and that she has renal failure. Pt is a Tu/Th/Sat rotation pt and said that her last treatment was Tues. Ch prayed for pt regarding her health and for the care staff assigned to her.    10/08/18 1500  Clinical Encounter Type  Visited With Patient  Visit Type Psychological support;Spiritual support;Social support  Referral From Physician  Consult/Referral To Chaplain  Spiritual Encounters  Spiritual Needs Prayer;Emotional;Grief support  Stress Factors  Patient Stress Factors Exhausted;Health changes;Loss of control;Major life changes  Family Stress Factors None identified

## 2018-10-08 NOTE — ED Notes (Signed)
ED TO INPATIENT HANDOFF REPORT  ED Nurse Name and Phone #: Helene Kelp [phone]  S Name/Age/Gender Jessica Burgess 63 y.o. female Room/Bed: ED06A/ED06A  Code Status   Code Status: Full Code  Home/SNF/Other Home Patient oriented to: self, place, time and situation Is this baseline? Yes   Triage Complete: Triage complete  Chief Complaint sob  Triage Note Pt presents to ED via POV with c/o SOB x "a while". Pt states hx of CHF and COPD with chronic 2-3L [place] use. Pt with noted BLE edema upon arrival, RR 32 upon arrival. Pt noted to have difficulty speaking in complete sentences at upon arrival. After moving from wheelchair to bed, pt noted to be 87% on 3L via [place].    Allergies Allergies  Allergen Reactions  . Chlorhexidine Itching    Pt reports itching this AM post wipes w/ CHG, no redness or irritation assessed    Level of Care/Admitting Diagnosis ED Disposition    ED Disposition Condition [facility] Area: [place] [[id number]]  Level of Care: Telemetry [5]  Covid Evaluation: Confirmed COVID Negative  Diagnosis: Sepsis [facility]) [[id number]]  Admitting Physician: Eula Flax  Attending Physician: Rufina Falco ACHIENG [[id number]]  Estimated length of stay: past midnight tomorrow  Certification:: I certify this patient will need inpatient services for at least 2 midnights  PT Class (Do Not Modify): Inpatient [101]  PT Acc Code (Do Not Modify): Private [1]       B Medical/Surgery History Past Medical History:  Diagnosis Date  . Allergic rhinitis, cause unspecified   . Anxiety state, unspecified   . Arthritis   . Asthma    only when sick   . Backache, unspecified   . Bronchitis    hx of when get sick  . Cancer ([place])    skin cancer , basal cell   . Cancer ([place]) 11/2016   ovarian  . Cervicalgia   . CHF (congestive heart failure) ([place])   . Complication of anesthesia   . Dermatophytosis of nail   . Dysmetabolic syndrome  X   . Encounter for long-term (current) use of other medications   . Esophageal reflux   . Hypertension   . Insomnia, unspecified   . Leukocytosis, unspecified   . Migraine without aura, without mention of intractable migraine without mention of status migrainosus   . Other and unspecified hyperlipidemia   . Other malaise and fatigue   . Overweight(278.02)   . Personal history of chemotherapy now   ovarian  . PONV (postoperative nausea and vomiting)   . Renal insufficiency   . Spinal stenosis in cervical region   . Symptomatic menopausal or female climacteric states   . Unspecified disorder of skin and subcutaneous tissue   . Unspecified vitamin D deficiency    Past Surgical History:  Procedure Laterality Date  . ABDOMINAL HYSTERECTOMY    . ANTERIOR CERVICAL DECOMP/DISCECTOMY FUSION N/A 06/07/2015   Procedure: Cervical three - four and Cervical six- seven anterior cervical decompression with fusion interbody prosthesis plating and bonegraft;  Surgeon: Newman Pies, MD;  Location: [facility];  Service: Neurosurgery;  Laterality: N/A;  C34 and C67 anterior cervical decompression with fusion interbody prosthesis plating and bonegraft  . BACK SURGERY     x2 Lower   . DIALYSIS/PERMA CATHETER INSERTION N/A 08/17/2018   Procedure: DIALYSIS/PERMA CATHETER INSERTION;  Surgeon: Algernon Huxley, MD;  Location: [facility] CV LAB;  Service: Cardiovascular;  Laterality: N/A;  .  EVACUATION OF CERVICAL HEMATOMA N/A 06/14/2015   Procedure: EVACUATION OF CERVICAL HEMATOMA;  Surgeon: Newman Pies, MD;  Location: [facility] OR[facility];  Service: Neurosurgery;  Laterality: N/A;  . NECK SURGERY     x3  . TONSILLECTOMY       A IV Location/Drains/Wounds Patient Lines/Drains/Airways Status   Active Line/Drains/Airways    Name:   Placement date:   Placement time:   Site:   Days:   Implanted Port 12/09/16 Right Chest   12/09/16    1124    Chest   668   Peripheral IV 10/08/18 Right Antecubital   10/08/18     0807    Antecubital   less than 1   Peripheral IV 10/08/18 Right Forearm   10/08/18    0808    Forearm   less than 1   Hemodialysis Catheter Left Subclavian Double-lumen   08/17/18    0800    Subclavian   52          Intake/Output Last 24 hours  Intake/Output Summary (Last 24 hours) at 10/08/2018 1112 Last data filed at 10/08/2018 1040 Gross per 24 hour  Intake 300 ml  Output -  Net 300 ml    Labs/Imaging Results for orders placed or performed during the hospital encounter of 10/08/18 (from the past 48 hour(s))  SARS Coronavirus 2 (CEPHEID- Performed in [place] hospital lab), Hosp Order     Status: None   Collection Time: 10/08/18  7:31 AM   Specimen: Nasopharyngeal Swab  Result Value Ref Range   SARS Coronavirus 2 NEGATIVE NEGATIVE    Comment: (NOTE) If result is NEGATIVE SARS-CoV-2 target nucleic acids are NOT DETECTED. The SARS-CoV-2 RNA is generally detectable in upper and lower  respiratory specimens during the acute phase of infection. The lowest  concentration of SARS-CoV-2 viral copies this assay can detect is 250  copies / mL. A negative result does not preclude SARS-CoV-2 infection  and should not be used as the sole basis for treatment or other  patient management decisions.  A negative result may occur with  improper specimen collection / handling, submission of specimen other  than nasopharyngeal swab, presence of viral mutation(s) within the  areas targeted by this assay, and inadequate number of viral copies  (<250 copies / mL). A negative result must be combined with clinical  observations, patient history, and epidemiological information. If result is POSITIVE SARS-CoV-2 target nucleic acids are DETECTED. The SARS-CoV-2 RNA is generally detectable in upper and lower  respiratory specimens dur ing the acute phase of infection.  Positive  results are indicative of active infection with SARS-CoV-2.  Clinical  correlation with patient history and other  diagnostic information is  necessary to determine patient infection status.  Positive results do  not rule out bacterial infection or co-infection with other viruses. If result is PRESUMPTIVE POSTIVE SARS-CoV-2 nucleic acids MAY BE PRESENT.   A presumptive positive result was obtained on the submitted specimen  and confirmed on repeat testing.  While 2019 novel coronavirus  (SARS-CoV-2) nucleic acids may be present in the submitted sample  additional confirmatory testing may be necessary for epidemiological  and / or clinical management purposes  to differentiate between  SARS-CoV-2 and other Sarbecovirus currently known to infect humans.  If clinically indicated additional testing with an alternate test  methodology [phone]) is advised. The SARS-CoV-2 RNA is generally  detectable in upper and lower respiratory sp ecimens during the acute  phase of infection. The expected result  is Negative. Fact Sheet for Patients:  StrictlyIdeas.no Fact Sheet for Healthcare Providers: BankingDealers.co.za This test is not yet approved or cleared by the [place] FDA and has been authorized for detection and/or diagnosis of SARS-CoV-2 by FDA under an Emergency Use Authorization (EUA).  This EUA will remain in effect (meaning this test can be used) for the duration of the COVID-19 declaration under Section 564(b)(1) of the Act, 21 U.S.C. section 360bbb-3(b)(1), unless the authorization is terminated or revoked sooner. Performed at [facility], [address] [place], [place] [address]   Lactic acid, plasma     Status: None   Collection Time: 10/08/18  7:31 AM  Result Value Ref Range   Lactic Acid, Venous 1.1 0.5 - 1.9 mmol/L    Comment: Performed at [facility], [address] [place], [place] [address]  Comprehensive metabolic panel     Status: Abnormal   Collection Time: 10/08/18  7:31 AM  Result Value Ref Range    Sodium 130 (L) 135 - 145 mmol/L   Potassium 5.0 3.5 - 5.1 mmol/L   Chloride 96 (L) 98 - 111 mmol/L   CO2 24 22 - 32 mmol/L   Glucose, Bld 162 (H) 70 - 99 mg/dL   BUN 46 (H) 8 - 23 mg/dL   Creatinine, Ser 4.21 (H) 0.44 - 1.00 mg/dL   Calcium 8.2 (L) 8.9 - 10.3 mg/dL   Total Protein 5.8 (L) 6.5 - 8.1 g/dL   Albumin 2.9 (L) 3.5 - 5.0 g/dL   AST 33 15 - 41 U/L   ALT 24 0 - 44 U/L   Alkaline Phosphatase 95 38 - 126 U/L   Total Bilirubin 0.9 0.3 - 1.2 mg/dL   GFR calc non Af Amer 11 (L) >60 mL/min   GFR calc Af Amer 12 (L) >60 mL/min   Anion gap 10 5 - 15    Comment: Performed at [facility], [address] [place], [place] [address]  CBC WITH DIFFERENTIAL     Status: Abnormal   Collection Time: 10/08/18  7:31 AM  Result Value Ref Range   WBC 3.0 (L) 4.0 - 10.5 K/uL   RBC 3.12 (L) 3.87 - 5.11 MIL/uL   Hemoglobin 10.0 (L) 12.0 - 15.0 g/dL   HCT 32.2 (L) 36.0 - 46.0 %   MCV 103.2 (H) 80.0 - 100.0 fL   MCH 32.1 26.0 - 34.0 pg   MCHC 31.1 30.0 - 36.0 g/dL   RDW 15.8 (H) 11.5 - 15.5 %   Platelets 147 (L) 150 - 400 K/uL   nRBC 0.0 0.0 - 0.2 %   Neutrophils Relative % 86 %   Neutro Abs 2.6 1.7 - 7.7 K/uL   Lymphocytes Relative 7 %   Lymphs Abs 0.2 (L) 0.7 - 4.0 K/uL   Monocytes Relative 1 %   Monocytes Absolute 0.0 (L) 0.1 - 1.0 K/uL   Eosinophils Relative 5 %   Eosinophils Absolute 0.1 0.0 - 0.5 K/uL   Basophils Relative 0 %   Basophils Absolute 0.0 0.0 - 0.1 K/uL   WBC Morphology MORPHOLOGY UNREMARKABLE    RBC Morphology MORPHOLOGY UNREMARKABLE    Smear Review PLATELET CLUMPS NOTED ON SMEAR, UNABLE TO ESTIMATE    Immature Granulocytes 1 %   Abs Immature Granulocytes 0.02 0.00 - 0.07 K/uL    Comment: Performed at [facility], [address], [place], [place] [address]  Brain natriuretic peptide     Status: Abnormal   Collection Time:  10/08/18  7:31 AM  Result Value Ref Range   B Natriuretic Peptide 1,214.0 (H) 0.0 - 100.0 pg/mL    Comment: Performed at  [facility], [place], [place] [address]  Troponin I (High Sensitivity)     Status: Abnormal   Collection Time: 10/08/18  7:31 AM  Result Value Ref Range   Troponin I (High Sensitivity) 37 (H) <18 ng/L    Comment: (NOTE) Elevated high sensitivity troponin I (hsTnI) values and significant  changes across serial measurements may suggest ACS but many other  chronic and acute conditions are known to elevate hsTnI results.  Refer to the "Links" section for chest pain algorithms and additional  guidance. Performed at [facility], [address] [place], [place] [address]   Urinalysis, Complete w Microscopic     Status: Abnormal   Collection Time: 10/08/18  8:35 AM  Result Value Ref Range   Color, Urine YELLOW (A) YELLOW   APPearance CLOUDY (A) CLEAR   Specific Gravity, Urine 1.017 1.005 - 1.030   pH 5.0 5.0 - 8.0   Glucose, UA NEGATIVE NEGATIVE mg/dL   Hgb urine dipstick MODERATE (A) NEGATIVE   Bilirubin Urine NEGATIVE NEGATIVE   Ketones, ur NEGATIVE NEGATIVE mg/dL   Protein, ur 100 (A) NEGATIVE mg/dL   Nitrite NEGATIVE NEGATIVE   Leukocytes,Ua NEGATIVE NEGATIVE   RBC / HPF 21-50 0 - 5 RBC/hpf   WBC, UA 0-5 0 - 5 WBC/hpf   Bacteria, UA NONE SEEN NONE SEEN   Squamous Epithelial / LPF NONE SEEN 0 - 5   Hyaline Casts, UA PRESENT    Granular Casts, UA PRESENT     Comment: Performed at [facility], [address] [place], [place] [address]   Dg Chest Port 1 View  Result Date: 10/08/2018 CLINICAL DATA:  Shortness of breath. EXAM: PORTABLE CHEST 1 VIEW COMPARISON:  Chest x-ray dated August 28, 2018. FINDINGS: Unchanged right chest wall port catheter and tunneled left internal jugular dialysis catheter. Stable cardiomegaly and pulmonary vascular congestion. Unchanged small bilateral pleural effusions. Slightly increased bibasilar atelectasis. No pneumothorax. No acute osseous abnormality. IMPRESSION: 1. Mild congestive heart failure, similar to  prior study. Electronically Signed   By: Titus Dubin M.D.   On: 10/08/2018 08:07    Pending Labs Unresulted Labs (From admission, onward)    Start     Ordered   10/08/18 1048  Procalcitonin  Add-on,   AD     10/08/18 1049   10/08/18 0734  Urine culture  Once,   STAT     10/08/18 0733   10/08/18 0723  Blood Culture (routine x 2)  BLOOD CULTURE X 2,   STAT     10/08/18 0723          Vitals/Pain Today's Vitals   10/08/18 0930 10/08/18 1000 10/08/18 1030 10/08/18 1103  BP: 138/88 124/66 (!) 141/74   Pulse: 81 80 73   Resp: 19 (!) 32 (!) 23   Temp:      TempSrc:      SpO2: 97% 96% 98%   Weight:      Height:      PainSc:    5     Isolation Precautions Airborne and Contact precautions  Medications Medications  oxyCODONE-acetaminophen (PERCOCET) 10-325 MG per tablet 1 tablet (has no administration in time range)  furosemide (LASIX) injection 40 mg (has no administration in time range)  hydrALAZINE (APRESOLINE) tablet 25 mg (has no administration in time range)  isosorbide mononitrate (IMDUR) 24 hr tablet 30 mg (has no administration in time range)  metoprolol succinate (TOPROL-XL) 24 hr tablet 50 mg (has no administration in time range)  DULoxetine (CYMBALTA) DR capsule 60 mg (has no administration in time range)  hydrOXYzine (ATARAX/VISTARIL) tablet 10 mg (has no administration in time range)  temazepam (RESTORIL) capsule 30 mg (has no administration in time range)  levothyroxine (SYNTHROID) tablet 25 mcg (has no administration in time range)  metoCLOPramide (REGLAN) tablet 5 mg (has no administration in time range)  ondansetron (ZOFRAN-ODT) disintegrating tablet 8 mg (has no administration in time range)  polyethylene glycol powder (GLYCOLAX/MIRALAX) container 17 g (has no administration in time range)  ferrous sulfate tablet 325 mg (has no administration in time range)  vitamin B-12 (CYANOCOBALAMIN) tablet 1,000 mcg (has no administration in time range)   cyclobenzaprine (FLEXERIL) tablet 10 mg (has no administration in time range)  vitamin C (ASCORBIC ACID) tablet 500 mg (has no administration in time range)  Magnesium Oxide TABS 400 mg (has no administration in time range)  loratadine (CLARITIN) tablet 10 mg (has no administration in time range)  ipratropium-albuterol (DUONEB) 0.5-2.5 (3) MG/3ML nebulizer solution 3 mL (has no administration in time range)  heparin injection 5,000 Units (has no administration in time range)  acetaminophen (TYLENOL) tablet 1,000 mg (1,000 mg Oral Given 10/08/18 0837)  ceFEPIme (MAXIPIME) 2 g in sodium chloride 0.9 % 100 mL IVPB (0 g Intravenous Stopped 10/08/18 0954)  vancomycin (VANCOCIN) IVPB 1000 mg/200 mL premix (0 mg Intravenous Stopped 10/08/18 1040)    Mobility walks Low fall risk   Focused Assessments See previous assessment    R Recommendations: See Admitting Provider Note  Report given to:   Additional Notes:

## 2018-10-08 NOTE — ED Triage Notes (Signed)
Pt presents to ED via POV with c/o SOB x "a while". Pt states hx of CHF and COPD with chronic 2-3L [place] use. Pt with noted BLE edema upon arrival, RR 32 upon arrival. Pt noted to have difficulty speaking in complete sentences at upon arrival. After moving from wheelchair to bed, pt noted to be 87% on 3L via [place].

## 2018-10-08 NOTE — ED Notes (Signed)
Pt given water 

## 2018-10-09 ENCOUNTER — Inpatient Hospital Stay: Payer: PPO

## 2018-10-09 ENCOUNTER — Other Ambulatory Visit: Payer: Self-pay | Admitting: *Deleted

## 2018-10-09 ENCOUNTER — Other Ambulatory Visit
Admission: RE | Admit: 2018-10-09 | Discharge: 2018-10-09 | Disposition: A | Discharge status: Home / Self Care | Payer: PPO | Source: Home / Self Care | Attending: Internal Medicine | Admitting: Internal Medicine

## 2018-10-09 DIAGNOSIS — C569 Malignant neoplasm of unspecified ovary: Secondary | ICD-10-CM

## 2018-10-09 LAB — BASIC METABOLIC PANEL
Anion gap: 14 (ref 5–15)
BUN: 58 mg/dL — ABNORMAL HIGH (ref 8–23)
CO2: 23 mmol/L (ref 22–32)
Calcium: 8.2 mg/dL — ABNORMAL LOW (ref 8.9–10.3)
Chloride: 93 mmol/L — ABNORMAL LOW (ref 98–111)
Creatinine, Ser: 4.85 mg/dL — ABNORMAL HIGH (ref 0.44–1.00)
GFR calc Af Amer: 10 mL/min — ABNORMAL LOW (ref >60)
GFR calc non Af Amer: 9 mL/min — ABNORMAL LOW (ref >60)
Glucose, Bld: 81 mg/dL (ref 70–99)
Potassium: 5.8 mmol/L — ABNORMAL HIGH (ref 3.5–5.1)
Sodium: 130 mmol/L — ABNORMAL LOW (ref 135–145)

## 2018-10-09 LAB — CBC WITH DIFFERENTIAL/PLATELET
Abs Immature Granulocytes: 0.21 10*3/uL — ABNORMAL HIGH (ref 0.00–0.07)
Basophils Absolute: 0 10*3/uL (ref 0.0–0.1)
Basophils Relative: 1 %
Eosinophils Absolute: 0 10*3/uL (ref 0.0–0.5)
Eosinophils Relative: 2 %
HCT: 29.6 % — ABNORMAL LOW (ref 36.0–46.0)
Hemoglobin: 9.2 g/dL — ABNORMAL LOW (ref 12.0–15.0)
Immature Granulocytes: 14 %
Lymphocytes Relative: 14 %
Lymphs Abs: 0.2 10*3/uL — ABNORMAL LOW (ref 0.7–4.0)
MCH: 32.6 pg (ref 26.0–34.0)
MCHC: 31.1 g/dL (ref 30.0–36.0)
MCV: 105 fL — ABNORMAL HIGH (ref 80.0–100.0)
Monocytes Absolute: 0 10*3/uL — ABNORMAL LOW (ref 0.1–1.0)
Monocytes Relative: 1 %
Neutro Abs: 1 10*3/uL — ABNORMAL LOW (ref 1.7–7.7)
Neutrophils Relative %: 68 %
Platelets: 125 10*3/uL — ABNORMAL LOW (ref 150–400)
RBC: 2.82 MIL/uL — ABNORMAL LOW (ref 3.87–5.11)
RDW: 15.6 % — ABNORMAL HIGH (ref 11.5–15.5)
Smear Review: UNDETERMINED
WBC: 1.5 10*3/uL — ABNORMAL LOW (ref 4.0–10.5)
nRBC: 0 % (ref 0.0–0.2)

## 2018-10-09 LAB — BODY FLUID CELL COUNT WITH DIFFERENTIAL
Eos, Fluid: 1 %
Lymphs, Fluid: 14 %
Monocyte-Macrophage-Serous Fluid: 6 %
Neutrophil Count, Fluid: 79 %
Other Cells, Fluid: 0 %
Total Nucleated Cell Count, Fluid: 1576 cu mm

## 2018-10-09 LAB — URINE CULTURE: Culture: NO GROWTH

## 2018-10-09 LAB — PROTEIN, PLEURAL OR PERITONEAL FLUID: Total protein, fluid: <3 g/dL

## 2018-10-09 LAB — GLUCOSE, PLEURAL OR PERITONEAL FLUID: Glucose, Fluid: 73 mg/dL

## 2018-10-09 LAB — C DIFFICILE QUICK SCREEN W PCR REFLEX
C Diff antigen: NEGATIVE
C Diff interpretation: NOT DETECTED
C Diff toxin: NEGATIVE

## 2018-10-09 LAB — LACTATE DEHYDROGENASE, PLEURAL OR PERITONEAL FLUID: LD, Fluid: 140 U/L — ABNORMAL HIGH (ref 3–23)

## 2018-10-09 LAB — TROPONIN I (HIGH SENSITIVITY): Troponin I (High Sensitivity): 60 ng/L — ABNORMAL HIGH (ref <18)

## 2018-10-09 MED ORDER — SODIUM CHLORIDE 0.9 % IV SOLN
2.0000 g | INTRAVENOUS | Status: DC
  Administered 2018-10-10: 2 g via INTRAVENOUS
  Filled 2018-10-09: qty 2

## 2018-10-09 MED ORDER — LIDOCAINE HCL (PF) 1 % IJ SOLN
5.0000 mL | INTRAMUSCULAR | Status: DC | PRN
  Filled 2018-10-09: qty 5

## 2018-10-09 MED ORDER — MAGNESIUM SULFATE 2 GM/50ML IV SOLN
2.0000 g | Freq: Once | INTRAVENOUS | Status: AC
  Administered 2018-10-09: 2 g via INTRAVENOUS
  Filled 2018-10-09: qty 50

## 2018-10-09 MED ORDER — METOPROLOL TARTRATE 5 MG/5ML IV SOLN
5.0000 mg | INTRAVENOUS | Status: DC | PRN
  Administered 2018-10-09 (×2): 5 mg via INTRAVENOUS
  Filled 2018-10-09 (×4): qty 5

## 2018-10-09 MED ORDER — DILTIAZEM HCL 100 MG IV SOLR
5.0000 mg/h | INTRAVENOUS | Status: DC
  Administered 2018-10-09: 12.5 mg/h via INTRAVENOUS
  Administered 2018-10-09: 7.5 mg/h via INTRAVENOUS
  Administered 2018-10-09: 5 mg/h via INTRAVENOUS
  Administered 2018-10-09: 10 mg/h via INTRAVENOUS
  Administered 2018-10-10: 12.5 mg/h via INTRAVENOUS
  Administered 2018-10-11: 5 mg/h via INTRAVENOUS
  Administered 2018-10-11: 7.5 mg/h via INTRAVENOUS
  Filled 2018-10-09 (×4): qty 100

## 2018-10-09 MED ORDER — HEPARIN SODIUM (PORCINE) 1000 UNIT/ML DIALYSIS
1000.0000 [IU] | INTRAMUSCULAR | Status: DC | PRN
  Filled 2018-10-09: qty 1

## 2018-10-09 MED ORDER — LIDOCAINE-PRILOCAINE 2.5-2.5 % EX CREA
1.0000 "application " | TOPICAL_CREAM | CUTANEOUS | Status: DC | PRN
  Filled 2018-10-09: qty 5

## 2018-10-09 MED ORDER — SODIUM CHLORIDE 0.9 % IV SOLN
2.0000 g | Freq: Once | INTRAVENOUS | Status: AC
  Administered 2018-10-09: 2 g via INTRAVENOUS
  Filled 2018-10-09: qty 2

## 2018-10-09 MED ORDER — MORPHINE SULFATE (PF) 2 MG/ML IV SOLN
2.0000 mg | Freq: Once | INTRAVENOUS | Status: AC
  Administered 2018-10-09: 2 mg via INTRAVENOUS
  Filled 2018-10-09: qty 1

## 2018-10-09 MED ORDER — PENTAFLUOROPROP-TETRAFLUOROETH EX AERO
1.0000 "application " | INHALATION_SPRAY | CUTANEOUS | Status: DC | PRN
  Filled 2018-10-09: qty 30

## 2018-10-09 MED ORDER — ALTEPLASE 2 MG IJ SOLR: 2.0000 mg | Freq: Once | INTRAMUSCULAR | Status: DC | PRN

## 2018-10-09 MED ORDER — DILTIAZEM HCL-DEXTROSE 100-5 MG/100ML-% IV SOLN (PREMIX)
5.0000 mg/h | INTRAVENOUS | Status: DC
  Filled 2018-10-09: qty 100

## 2018-10-09 MED ORDER — SODIUM CHLORIDE 0.9 % IV SOLN: 100.0000 mL | INTRAVENOUS | Status: DC | PRN

## 2018-10-09 MED ORDER — DIGOXIN 0.25 MG/ML IJ SOLN
0.2500 mg | Freq: Once | INTRAMUSCULAR | Status: AC
  Administered 2018-10-09: 0.25 mg via INTRAVENOUS
  Filled 2018-10-09 (×2): qty 2

## 2018-10-09 MED ORDER — CHLORHEXIDINE GLUCONATE CLOTH 2 % EX PADS
6.0000 | MEDICATED_PAD | Freq: Every day | CUTANEOUS | Status: DC
  Administered 2018-10-09 – 2018-10-10 (×2): 6 via TOPICAL

## 2018-10-09 MED ORDER — CHLORHEXIDINE GLUCONATE CLOTH 2 % EX PADS
6.0000 | MEDICATED_PAD | Freq: Every day | CUTANEOUS | Status: DC
  Administered 2018-10-12: 6 via TOPICAL

## 2018-10-09 NOTE — Progress Notes (Signed)
HD Tx Completed   10/09/18 1445  Vital Signs  Temp (!) 100.5 F (38.1 C)  Temp Source Axillary  Pulse Rate (!) 138  Pulse Rate Source Monitor  Resp (!) 27  BP 131/84  BP Location Left Arm  BP Method Automatic  Patient Position (if appropriate) Lying  Oxygen Therapy  SpO2 100 %  O2 Device Nasal Cannula  O2 Flow Rate (L/min) 2 L/min  Pain Assessment  Pain Scale 0-10  Pain Score 0  During Hemodialysis Assessment  Blood Flow Rate (mL/min) 400 mL/min  Arterial Pressure (mmHg) -210 mmHg  Venous Pressure (mmHg) 160 mmHg  Transmembrane Pressure (mmHg) 70 mmHg  Ultrafiltration Rate (mL/min) 800 mL/min  Dialysate Flow Rate (mL/min) 570 ml/min  Conductivity: Machine  13.9  HD Safety Checks Performed Yes  Intra-Hemodialysis Comments Tx completed

## 2018-10-09 NOTE — Progress Notes (Signed)
Pharmacy Antibiotic Note  Jessica Burgess is a 63 y.o. female admitted on 10/08/2018 with sepsis.  Patient started on vancomycin + cefepime for empiric therapy. Of note patient has stage 3 ovarian cancer on chemotherapy (last session 7/21) and pancytopenia on CBC. Patient has ESRD and is on dialysis on a Tuesday, Thursday, Saturday schedule.   In ED patient received a LD of Vancomycin 1.5g x1 and cefepime 2g x1. Patient received a second dose of cefepime 2g on 7/23 despite not going for dialysis. Tentative plan is for dialysis today.   Plan: Cefepime 2g IV after each HD session  Vancomycin 750mg  IV after each HD session, will check a trough level prior to 3rd HD session  Height: 5\' 2"  (157.5 cm) Weight: 158 lb 4.6 oz (71.8 kg) IBW/kg (Calculated) : 50.1  Temp (24hrs), Avg:99.1 F (37.3 C), Min:97.5 F (36.4 C), Max:103 F (39.4 C)  Recent Labs  Lab 10/06/18 1312 10/06/18 1313 10/08/18 0731 10/09/18 0546  WBC 4.7  --  3.0* 1.5*  CREATININE  --  2.59* 4.21* 4.85*  LATICACIDVEN  --   --  1.1  --     Estimated Creatinine Clearance: 11 mL/min (A) (by C-G formula based on SCr of 4.85 mg/dL (H)).    No Known Allergies  Antimicrobials this admission: Vancomycin 7/23 >>  Cefepime 7/23 >>   Thank you for allowing pharmacy to be a part of this patient's care.  Lockie Mola, PharmD Pharmacy Resident 10/09/2018 11:57 AM

## 2018-10-09 NOTE — Progress Notes (Signed)
[facility]  ROUNDING NOTE   Subjective:  Patient well known to [place]. Followed for ESRD in the setting of ovarian cancer.  Comes in now with shortness of breath.      Objective:  Vital signs in last 24 hours:  Temp:  [97.5 F (36.4 C)-103 F (39.4 C)] 97.5 F (36.4 C) (07/24 0833) Pulse Rate:  [68-103] 70 (07/24 0833) Resp:  [16-26] 22 (07/24 0833) BP: (115-202)/(65-89) 117/66 (07/24 0833) SpO2:  [90 %-100 %] 97 % (07/24 0833) Weight:  [70.7 kg-71.8 kg] 71.8 kg (07/24 0422)  Weight change:  Filed Weights   10/08/18 0717 10/08/18 1247 10/09/18 0422  Weight: 66.7 kg 70.7 kg 71.8 kg    Intake/Output: I/O last 3 completed shifts: In: 427.8 [I.V.:27.8; IV Piggyback:400] Out: 0    Intake/Output this shift:  Total I/O In: 480 [P.O.:480] Out: -   Physical Exam: General: No acute distress  Head: Normocephalic, atraumatic. Moist oral mucosal membranes  Eyes: Anicteric  Neck: Supple, trachea midline  Lungs:  Bilateral rhonchi and rales, normal effort  Heart: S1S2 no rubs  Abdomen:  Soft, nontender, bowel sounds present  Extremities: 2+ peripheral edema.  Neurologic: Awake, alert, following commands  Skin: No lesions  Access:  IJ PermCath    Basic Metabolic Panel: Recent Labs  Lab 10/06/18 1313 10/08/18 0731 10/09/18 0546  NA 138 130* 130*  K 5.2* 5.0 5.8*  CL 97* 96* 93*  CO2 32 24 23  GLUCOSE 95 162* 81  BUN 18 46* 58*  CREATININE 2.59* 4.21* 4.85*  CALCIUM 8.3* 8.2* 8.2*    Liver Function Tests: Recent Labs  Lab 10/06/18 1313 10/08/18 0731  AST 26 33  ALT 26 24  ALKPHOS 91 95  BILITOT 0.8 0.9  PROT 5.8* 5.8*  ALBUMIN 3.0* 2.9*   No results for input(s): LIPASE, AMYLASE in the last 168 hours. No results for input(s): AMMONIA in the last 168 hours.  CBC: Recent Labs  Lab 10/06/18 1312 10/08/18 0731 10/09/18 0546  WBC 4.7 3.0* 1.5*  NEUTROABS 3.4 2.6 1.0*  HGB 10.0* 10.0* 9.2*  HCT 32.5* 32.2* 29.6*  MCV 106.2* 103.2* 105.0*   PLT 100* 147* 125*    Cardiac Enzymes: No results for input(s): CKTOTAL, CKMB, CKMBINDEX, TROPONINI in the last 168 hours.  BNP: Invalid input(s): POCBNP  CBG: No results for input(s): GLUCAP in the last 168 hours.  Microbiology: Results for orders placed or performed during the hospital encounter of 10/08/18  SARS Coronavirus 2 (CEPHEID- Performed in [facility] hospital lab), Hosp Order     Status: None   Collection Time: 10/08/18  7:31 AM   Specimen: Nasopharyngeal Swab  Result Value Ref Range Status   SARS Coronavirus 2 NEGATIVE NEGATIVE Final    Comment: (NOTE) If result is NEGATIVE SARS-CoV-2 target nucleic acids are NOT DETECTED. The SARS-CoV-2 RNA is generally detectable in upper and lower  respiratory specimens during the acute phase of infection. The lowest  concentration of SARS-CoV-2 viral copies this assay can detect is 250  copies / mL. A negative result does not preclude SARS-CoV-2 infection  and should not be used as the sole basis for treatment or other  patient management decisions.  A negative result may occur with  improper specimen collection / handling, submission of specimen other  than nasopharyngeal swab, presence of viral mutation(s) within the  areas targeted by this assay, and inadequate number of viral copies  (<250 copies / mL). A negative result must be combined with  clinical  observations, patient history, and epidemiological information. If result is POSITIVE SARS-CoV-2 target nucleic acids are DETECTED. The SARS-CoV-2 RNA is generally detectable in upper and lower  respiratory specimens dur ing the acute phase of infection.  Positive  results are indicative of active infection with SARS-CoV-2.  Clinical  correlation with patient history and other diagnostic information is  necessary to determine patient infection status.  Positive results do  not rule out bacterial infection or co-infection with other viruses. If result is PRESUMPTIVE  POSTIVE SARS-CoV-2 nucleic acids MAY BE PRESENT.   A presumptive positive result was obtained on the submitted specimen  and confirmed on repeat testing.  While 2019 novel coronavirus  (SARS-CoV-2) nucleic acids may be present in the submitted sample  additional confirmatory testing may be necessary for epidemiological  and / or clinical management purposes  to differentiate between  SARS-CoV-2 and other Sarbecovirus currently known to infect humans.  If clinically indicated additional testing with an alternate test  methodology [phone]) is advised. The SARS-CoV-2 RNA is generally  detectable in upper and lower respiratory sp ecimens during the acute  phase of infection. The expected result is Negative. Fact Sheet for Patients:  StrictlyIdeas.no Fact Sheet for Healthcare Providers: BankingDealers.co.za This test is not yet approved or cleared by the [place] FDA and has been authorized for detection and/or diagnosis of SARS-CoV-2 by FDA under an Emergency Use Authorization (EUA).  This EUA will remain in effect (meaning this test can be used) for the duration of the COVID-19 declaration under Section 564(b)(1) of the Act, 21 U.S.C. section 360bbb-3(b)(1), unless the authorization is terminated or revoked sooner. Performed at [facility], [address] [place], [place] [address]   Blood Culture (routine x 2)     Status: None (Preliminary result)   Collection Time: 10/08/18  7:32 AM   Specimen: BLOOD  Result Value Ref Range Status   Specimen Description BLOOD RIGHT FA  Final   Special Requests   Final    BOTTLES DRAWN AEROBIC AND ANAEROBIC Blood Culture adequate volume   Culture   Final    NO GROWTH 1 DAY Performed at [facility], [address], [place], [place] [address]    Report Status PENDING  Incomplete  Blood Culture (routine x 2)     Status: None (Preliminary result)   Collection Time: 10/08/18   7:32 AM   Specimen: BLOOD  Result Value Ref Range Status   Specimen Description BLOOD RIGHT HAND  Final   Special Requests   Final    BOTTLES DRAWN AEROBIC AND ANAEROBIC Blood Culture results may not be optimal due to an excessive volume of blood received in culture bottles   Culture   Final    NO GROWTH 1 DAY Performed at [facility], [address], [place], [place]  [address]    Report Status PENDING  Incomplete  Urine culture     Status: None   Collection Time: 10/08/18  8:35 AM   Specimen: Urine, Random  Result Value Ref Range Status   Specimen Description   Final    URINE, RANDOM Performed at [facility], [address], [place], [place] [address]    Special Requests   Final    NONE Performed at [facility], [address] [place], [place] [address]    Culture   Final    NO GROWTH Performed at [facility], [place] [address], [place], [place] [address]    Report  Status 10/09/2018 FINAL  Final  C difficile quick scan w PCR reflex     Status: None   Collection Time: 10/09/18  4:10 AM   Specimen: STOOL  Result Value Ref Range Status   C Diff antigen NEGATIVE NEGATIVE Final   C Diff toxin NEGATIVE NEGATIVE Final   C Diff interpretation No C. difficile detected.  Final    Comment: Performed at [facility], [address] [place], [place] [address]    Coagulation Studies: No results for input(s): LABPROT, INR in the last 72 hours.  Urinalysis: Recent Labs    10/08/18 0835  COLORURINE YELLOW*  LABSPEC 1.017  PHURINE 5.0  GLUCOSEU NEGATIVE  HGBUR MODERATE*  BILIRUBINUR NEGATIVE  KETONESUR NEGATIVE  PROTEINUR 100*  NITRITE NEGATIVE  LEUKOCYTESUR NEGATIVE      Imaging: Dg Chest Port 1 View  Result Date: 10/08/2018 CLINICAL DATA:  Shortness of breath. EXAM: PORTABLE CHEST 1 VIEW COMPARISON:  Chest x-ray dated August 28, 2018. FINDINGS: Unchanged right chest wall port catheter and tunneled left internal jugular  dialysis catheter. Stable cardiomegaly and pulmonary vascular congestion. Unchanged small bilateral pleural effusions. Slightly increased bibasilar atelectasis. No pneumothorax. No acute osseous abnormality. IMPRESSION: 1. Mild congestive heart failure, similar to prior study. Electronically Signed   By: Titus Dubin M.D.   On: 10/08/2018 08:07     Medications:   . sodium chloride Stopped (10/08/18 2113)  . sodium chloride    . sodium chloride    . ceFEPime (MAXIPIME) IV Stopped (10/08/18 1828)  . vancomycin     . Chlorhexidine Gluconate Cloth  6 each Topical Q0600  . Chlorhexidine Gluconate Cloth  6 each Topical Q0600  . DULoxetine  60 mg Oral Daily  . ferrous sulfate  325 mg Oral Daily  . heparin  5,000 Units Subcutaneous Q8H  . hydrALAZINE  25 mg Oral TID  . ipratropium-albuterol  3 mL Nebulization Q6H  . isosorbide mononitrate  30 mg Oral Daily  . levothyroxine  25 mcg Oral QAC breakfast  . loratadine  10 mg Oral Daily  . magnesium oxide  400 mg Oral BID  . metoprolol succinate  50 mg Oral Daily  . sodium chloride flush  3 mL Intravenous Q12H  . vitamin B-12  1,000 mcg Oral Daily  . vitamin C  500 mg Oral Daily   sodium chloride, sodium chloride, sodium chloride, acetaminophen, alteplase, cyclobenzaprine, heparin, hydrOXYzine, labetalol, lidocaine (PF), lidocaine-prilocaine, metoCLOPramide, ondansetron, oxyCODONE-acetaminophen **AND** oxyCODONE, pentafluoroprop-tetrafluoroeth, polyethylene glycol, sodium chloride flush, temazepam  Assessment/ Plan:  63 y.o. female with history ofovarian cancer stage III,GERD, migraine headaches, cervical spinal stenosis, who was admitted for shortness of breath and fever.  [facility]  1.  ESRD on HD TTS.  Patient missed hemodialysis yesterday.  Due for hemodialysis today therefore.  We have prepared orders.  Ultrafiltration target 1.5 kg.  2.  Fever.  Patient was found to COVID 19-.  Also neutropenic.  Agree with cefepime and  vancomycin.  3.  Anemia of chronic kidney disease.  Hemoglobin currently 9.5.  Continue to monitor closely over the course of hospitalization.  4.  Secondary hyperparathyroidism.  Not on binder therapy at the moment.  Check serum phosphorus with next dialysis treatment.   LOS: 1 Jessica Burgess 7/24/202011:49 AM

## 2018-10-09 NOTE — Progress Notes (Signed)
Post HD Tx:   10/09/18 1500  Vital Signs  Temp (!) 100.5 F (38.1 C)  Temp Source Axillary  Pulse Rate (!) 116  Pulse Rate Source Monitor  Resp (!) 21  BP (!) 146/98  BP Location Left Arm  BP Method Automatic  Patient Position (if appropriate) Lying  Oxygen Therapy  SpO2 100 %  O2 Device Nasal Cannula  O2 Flow Rate (L/min) 2 L/min  Pain Assessment  Pain Scale 0-10  Pain Score 0  Post-Hemodialysis Assessment  Rinseback Volume (mL) 250 mL  KECN 78 V  Dialyzer Clearance Lightly streaked  Duration of HD Treatment -hour(s) 3.5 hour(s)  Hemodialysis Intake (mL) 500 mL  UF Total -Machine (mL) 2016 mL  Net UF (mL) 1516 mL  Tolerated HD Treatment Yes

## 2018-10-09 NOTE — Progress Notes (Signed)
Patient ID: Jessica Burgess, female   DOB: 28-Aug-1955, 63 y.o.   MRN: [id number]  Spoke with the sonogram department.  The patient did not have much fluid in her abdomen but they saw a moderate right pleural effusion.  They change the order from a paracentesis to a right-sided thoracentesis.  Dr Loletha Grayer

## 2018-10-09 NOTE — Progress Notes (Signed)
Pre HD Assessment:   10/09/18 1100  Neurological  Level of Consciousness Alert  Orientation Level Oriented X4  Respiratory  Respiratory Pattern Irregular;Tachypnea  Chest Assessment Chest expansion symmetrical  R Upper  Breath Sounds Diminished  L Upper Breath Sounds Diminished  Cardiac  Pulse Irregular  Vascular  R Radial Pulse +2  L Radial Pulse +2  Psychosocial  Psychosocial (WDL) X  Patient Behaviors Anxious

## 2018-10-09 NOTE — Progress Notes (Signed)
Called by the patient. States she feels like her lung has deflated.  Respirations labored.  Right lung sounds course.  C/o pain on her right side.  Holding her right side.  Spoke with Rufina Falco, NP.  Orders for STAT CXR, STAT EKG and troponin level.  Also administered Morphine 2 mg, slow IVP.

## 2018-10-09 NOTE — Procedures (Signed)
Pre procedural Dx: Symptomatic Pleural effusion Post procedural Dx: Same  Sonographic evaluation of the abdomen demonstrates a trace amount of abdominal ascites, too small to allow for safe US guided paracentesis.  Successful US guided right sided thoracentesis yielding 700 cc of serous pleural fluid.   Samples sent to lab for analysis.  EBL: None Complications: None immediate.  Ronny Bacon, MD Pager #: [phone]

## 2018-10-09 NOTE — Consult Note (Signed)
[place]  [phone] [phone]  ID: Jessica Burgess OB: 1955-08-27  MR#: [id number]  XBW#:[id number]  Patient Care Team: Steele Sizer, MD as PCP - General (Family Medicine) Lloyd Huger, MD as Consulting Physician (Oncology) Mellody Drown, MD as Consulting Physician (Obstetrics and Gynecology) Cathi Roan, [facility] (Pharmacist) Benedetto Goad, RN as Case Manager Clent Jacks, RN as Registered Nurse  CHIEF COMPLAINT: Progressive ovarian cancer, neutropenic fever, end-stage renal disease.  INTERVAL HISTORY: Patient is a 63 year old female with progressive high stage ovarian carcinoma and end-stage renal disease who recently received chemotherapy with single agent gemcitabine on October 06, 2018.  She admitted the hospital with neutropenic fever.  Patient evaluated while getting dialysis.  She continues to have fevers.  She has no neurologic complaints.  She has significant abdominal pain and bloating.  She has a poor appetite, but denies weight loss.  She denies any chest pain, hemoptysis, or cough.  She has chronic shortness of breath requiring oxygen 24 hours a day.  She denies any nausea, vomiting, constipation, or diarrhea.  She has bilateral lower extremity edema.  She has no urinary complaints.  Patient offers no further specific complaints today.  REVIEW OF SYSTEMS:   Review of Systems  Constitutional: Positive for chills, fever and malaise/fatigue. Negative for weight loss.  Respiratory: Positive for shortness of breath. Negative for cough and hemoptysis.   Cardiovascular: Positive for leg swelling. Negative for chest pain.  Gastrointestinal: Positive for abdominal pain.  Genitourinary: Negative.  Negative for dysuria.  Musculoskeletal: Positive for back pain.  Skin: Negative.  Negative for rash.  Neurological: Positive for weakness. Negative for dizziness, focal weakness and headaches.  Psychiatric/Behavioral: The  patient is nervous/anxious.     As per HPI. Otherwise, a complete review of systems is negative.  PAST MEDICAL HISTORY: Past Medical History:  Diagnosis Date   Allergic rhinitis, cause unspecified    Anxiety state, unspecified    Arthritis    Asthma    only when sick    Backache, unspecified    Bronchitis    hx of when get sick   Cancer [facility])    skin cancer , basal cell    Cancer ([place]) 11/2016   ovarian   Cervicalgia    CHF (congestive heart failure) (HCC)    Complication of anesthesia    Dermatophytosis of nail    Dysmetabolic syndrome X    Encounter for long-term (current) use of other medications    Esophageal reflux    Hypertension    Insomnia, unspecified    Leukocytosis, unspecified    Migraine without aura, without mention of intractable migraine without mention of status migrainosus    Other and unspecified hyperlipidemia    Other malaise and fatigue    Overweight(278.02)    Personal history of chemotherapy now   ovarian   PONV (postoperative nausea and vomiting)    Renal insufficiency    Spinal stenosis in cervical region    Symptomatic menopausal or female climacteric states    Unspecified disorder of skin and subcutaneous tissue    Unspecified vitamin D deficiency     PAST SURGICAL HISTORY: Past Surgical History:  Procedure Laterality Date   ABDOMINAL HYSTERECTOMY     ANTERIOR CERVICAL DECOMP/DISCECTOMY FUSION N/A 06/07/2015   Procedure: Cervical three - four and Cervical six- seven anterior cervical decompression with fusion interbody prosthesis plating and bonegraft;  Surgeon: Newman Pies, MD;  Location: [place] [facility];  Service: Neurosurgery;  Laterality: N/A;  C34 and C67 anterior cervical decompression with fusion interbody prosthesis plating and bonegraft   BACK SURGERY     x2 Lower    DIALYSIS/PERMA CATHETER INSERTION N/A 08/17/2018   Procedure: DIALYSIS/PERMA CATHETER INSERTION;  Surgeon: Algernon Huxley, MD;   Location: [place] CV LAB;  Service: Cardiovascular;  Laterality: N/A;   EVACUATION OF CERVICAL HEMATOMA N/A 06/14/2015   Procedure: EVACUATION OF CERVICAL HEMATOMA;  Surgeon: Newman Pies, MD;  Location: [place] [facility];  Service: Neurosurgery;  Laterality: N/A;   NECK SURGERY     x3   TONSILLECTOMY      FAMILY HISTORY: Family History  Problem Relation Age of Onset   Depression Mother    Migraines Mother    Dementia Father    Diabetes Father    Hyperlipidemia Father    Hyperlipidemia Brother    Hyperlipidemia Brother    Breast cancer Paternal Aunt        43s    ADVANCED DIRECTIVES (Y/N):  @ADVDIR @  HEALTH MAINTENANCE: Social History   Tobacco Use   Smoking status: Former Smoker    Packs/day: 1.50    Years: 20.00    Pack years: 30.00    Types: Cigarettes    Start date: 03/19/1979    Quit date: 08/25/1999    Years since quitting: 19.1   Smokeless tobacco: Never Used   Tobacco comment: smoking cessation materials not required  Substance Use Topics   Alcohol use: Not Currently    Alcohol/week: 0.0 standard drinks   Drug use: No     Colonoscopy:  PAP:  Bone density:  Lipid panel:  No Known Allergies  Current Facility-Administered Medications  Medication Dose Route Frequency Provider Last Rate Last Dose   0.9 %  sodium chloride infusion   Intravenous PRN Lang Snow, NP   Stopped at 10/08/18 2113   0.9 %  sodium chloride infusion  100 mL Intravenous PRN Lateef, Munsoor, MD       0.9 %  sodium chloride infusion  100 mL Intravenous PRN Lateef, Munsoor, MD       acetaminophen (TYLENOL) tablet 650 mg  650 mg Oral Q6H PRN Lang Snow, NP   650 mg at 10/08/18 2058   alteplase (CATHFLO ACTIVASE) injection 2 mg  2 mg Intracatheter Once PRN Anthonette Legato, MD       [START ON 10/10/2018] ceFEPIme (MAXIPIME) 2 g in sodium chloride 0.9 % 100 mL IVPB  2 g Intravenous Q T,Th,Sa-HD Wieting, Richard, MD       ceFEPIme (MAXIPIME) 2 g  in sodium chloride 0.9 % 100 mL IVPB  2 g Intravenous Once Wieting, Richard, MD       Chlorhexidine Gluconate Cloth 2 % PADS 6 each  6 each Topical Q0600 Loletha Grayer, MD   6 each at 10/09/18 0510   Chlorhexidine Gluconate Cloth 2 % PADS 6 each  6 each Topical Q0600 Lateef, Munsoor, MD       cyclobenzaprine (FLEXERIL) tablet 10 mg  10 mg Oral Q8H PRN Lang Snow, NP       DULoxetine (CYMBALTA) DR capsule 60 mg  60 mg Oral Daily Lang Snow, NP   60 mg at 10/08/18 1452   ferrous sulfate tablet 325 mg  325 mg Oral Daily Lang Snow, NP   325 mg at 10/08/18 1749   heparin injection 1,000 Units  1,000 Units Dialysis PRN Lateef, Munsoor, MD       heparin injection 5,000 Units  5,000 Units  Subcutaneous Q8H Lang Snow, NP   5,000 Units at 10/09/18 0086   hydrALAZINE (APRESOLINE) tablet 25 mg  25 mg Oral TID Lang Snow, NP   25 mg at 10/08/18 2058   hydrOXYzine (ATARAX/VISTARIL) tablet 10 mg  10 mg Oral Q6H PRN Lang Snow, NP   10 mg at 10/08/18 1749   ipratropium-albuterol (DUONEB) 0.5-2.5 (3) MG/3ML nebulizer solution 3 mL  3 mL Nebulization Q6H Lang Snow, NP   3 mL at 10/09/18 0509   isosorbide mononitrate (IMDUR) 24 hr tablet 30 mg  30 mg Oral Daily Lang Snow, NP   30 mg at 10/09/18 1301   labetalol (NORMODYNE) injection 10 mg  10 mg Intravenous Q2H PRN Lang Snow, NP   5 mg at 10/08/18 7619   levothyroxine (SYNTHROID) tablet 25 mcg  25 mcg Oral QAC breakfast Lang Snow, NP   25 mcg at 10/09/18 0555   lidocaine (PF) (XYLOCAINE) 1 % injection 5 mL  5 mL Intradermal PRN Lateef, Munsoor, MD       lidocaine-prilocaine (EMLA) cream 1 application  1 application Topical PRN Lateef, Munsoor, MD       loratadine (CLARITIN) tablet 10 mg  10 mg Oral Daily Lang Snow, NP   10 mg at 10/08/18 1452   magnesium oxide (MAG-OX) tablet 400 mg  400 mg Oral BID  Lang Snow, NP   400 mg at 10/08/18 2058   metoCLOPramide (REGLAN) tablet 5 mg  5 mg Oral Q8H PRN Lang Snow, NP       metoprolol succinate (TOPROL-XL) 24 hr tablet 50 mg  50 mg Oral Daily Lang Snow, NP   50 mg at 10/08/18 1453   ondansetron (ZOFRAN-ODT) disintegrating tablet 8 mg  8 mg Oral Q8H PRN Lang Snow, NP       oxyCODONE-acetaminophen (PERCOCET/ROXICET) 5-325 MG per tablet 1 tablet  1 tablet Oral Q6H PRN Lang Snow, NP   1 tablet at 10/09/18 1256   And   oxyCODONE (Oxy IR/ROXICODONE) immediate release tablet 5 mg  5 mg Oral Q6H PRN Lang Snow, NP   5 mg at 10/09/18 1256   pentafluoroprop-tetrafluoroeth (GEBAUERS) aerosol 1 application  1 application Topical PRN Lateef, Munsoor, MD       polyethylene glycol (MIRALAX / GLYCOLAX) packet 17 g  17 g Oral Daily PRN Lang Snow, NP       sodium chloride flush (NS) 0.9 % injection 3 mL  3 mL Intravenous Q12H Lang Snow, NP   3 mL at 10/09/18 0842   sodium chloride flush (NS) 0.9 % injection 3 mL  3 mL Intravenous PRN Lang Snow, NP       temazepam (RESTORIL) capsule 30 mg  30 mg Oral QHS PRN Lang Snow, NP   30 mg at 10/08/18 2253   vancomycin (VANCOCIN) IVPB 750 mg/150 ml premix  750 mg Intravenous Q T,Th,Sa-HD Lang Snow, NP       vitamin B-12 (CYANOCOBALAMIN) tablet 1,000 mcg  1,000 mcg Oral Daily Lang Snow, NP   1,000 mcg at 10/08/18 1452   vitamin C (ASCORBIC ACID) tablet 500 mg  500 mg Oral Daily Lang Snow, NP   500 mg at 10/08/18 1452   Facility-Administered Medications Ordered in Other Encounters  Medication Dose Route Frequency Provider Last Rate Last Dose   0.9 %  sodium chloride infusion   Intravenous Once Lloyd Huger, MD  0.9 %  sodium chloride infusion   Intravenous Once Lloyd Huger, MD       dexamethasone (DECADRON) 20 mg in  sodium chloride 0.9 % 50 mL IVPB  20 mg Intravenous Once Lloyd Huger, MD       dexamethasone (DECADRON) injection 10 mg  10 mg Intravenous Once Lloyd Huger, MD       sodium chloride flush (NS) 0.9 % injection 10 mL  10 mL Intravenous PRN Lloyd Huger, MD   10 mL at 06/16/17 0854    OBJECTIVE: Vitals:   10/09/18 0510 10/09/18 0833  BP:  117/66  Pulse:  70  Resp:  (!) 22  Temp:  (!) 97.5 F (36.4 C)  SpO2: 99% 97%     Body mass index is 28.95 kg/m.    ECOG FS:3 - Symptomatic, >50% confined to bed  General: Ill-appearing, moderate distress. Eyes: Pink conjunctiva, anicteric sclera. HEENT: Normocephalic, moist mucous membranes. Lungs: Clear to auscultation bilaterally. Heart: Regular rate and rhythm. No rubs, murmurs, or gallops. Abdomen: Soft, nontender, mildly distended. Musculoskeletal: No edema, cyanosis, or clubbing. Neuro: Alert, answering all questions appropriately. Cranial nerves grossly intact. Skin: No rashes or petechiae noted. Psych: Anxious.   LAB RESULTS:  Lab Results  Component Value Date   NA 130 (L) 10/09/2018   K 5.8 (H) 10/09/2018   CL 93 (L) 10/09/2018   CO2 23 10/09/2018   GLUCOSE 81 10/09/2018   BUN 58 (H) 10/09/2018   CREATININE 4.85 (H) 10/09/2018   CALCIUM 8.2 (L) 10/09/2018   PROT 5.8 (L) 10/08/2018   ALBUMIN 2.9 (L) 10/08/2018   AST 33 10/08/2018   ALT 24 10/08/2018   ALKPHOS 95 10/08/2018   BILITOT 0.9 10/08/2018   GFRNONAA 9 (L) 10/09/2018   GFRAA 10 (L) 10/09/2018    Lab Results  Component Value Date   WBC 1.5 (L) 10/09/2018   NEUTROABS 1.0 (L) 10/09/2018   HGB 9.2 (L) 10/09/2018   HCT 29.6 (L) 10/09/2018   MCV 105.0 (H) 10/09/2018   PLT 125 (L) 10/09/2018     STUDIES: Ct Abdomen Pelvis Wo Contrast  Result Date: 09/21/2018 CLINICAL DATA:  Nausea and vomiting. EXAM: CT ABDOMEN AND PELVIS WITHOUT CONTRAST TECHNIQUE: Multidetector CT imaging of the abdomen and pelvis was performed following the standard  protocol without IV contrast. COMPARISON:  CT 08/06/2018. PET CT 05/07/2018 FINDINGS: Lower chest: Moderate bilateral pleural effusions with compressive atelectasis. Cardiomegaly. Hepatobiliary: No focal hepatic abnormality on noncontrast exam. Postcholecystectomy with unchanged biliary prominence. Pancreas: No ductal dilatation. Mild stranding in the upper abdomen, without discrete peripancreatic inflammation. Spleen: Normal in size without focal abnormality. Adrenals/Urinary Tract: No adrenal nodule. No hydronephrosis. Mild symmetric bilateral perinephric edema. No urolithiasis. Urinary bladder is nondistended. Stomach/Bowel: Stomach is distended with ingested contents. No gastric wall thickening. No small bowel dilatation or obvious inflammation. No obstruction. Appendix not definitively visualized. Large volume of stool throughout the colon. No colonic wall thickening or inflammatory change. No abnormal rectal distention. Vascular/Lymphatic: Aortic atherosclerosis. No aneurysm. Limited assessment for adenopathy given ascites and Burgess contrast. No bulky abdominopelvic adenopathy. Reproductive: Status post hysterectomy. No adnexal masses. Other: Increase abdominopelvic ascites, no small to moderate. Increased mesenteric and generalized soft tissue edema from prior. Soft tissue nodularity in the left upper omentum, images 34 and 37, slight increase from prior. Questionable new omental nodularity anteriorly in the midline, image 46 series 3. Postsurgical change of the anterior abdominal wall. No free air. Small ventral abdominal wall hernia contains fat.  Musculoskeletal: Postsurgical and degenerative change in the spine. There are no acute or suspicious osseous abnormalities. IMPRESSION: 1. Increased abdominopelvic ascites and generalized mesenteric and soft tissue edema. Slight increased soft tissue nodularity in the left upper omentum, possible new omental nodularity anteriorly. 2. Large colonic stool burden  suggesting constipation. No bowel obstruction. Ingested material distends the stomach, if there is been no recent p.o. intake, question gastroparesis. 3. Moderate bilateral pleural effusions with compressive atelectasis. 4.  Aortic Atherosclerosis (ICD10-I70.0). Electronically Signed   By: Keith Rake M.D.   On: 09/21/2018 01:01   Dg Chest Port 1 View  Result Date: 10/08/2018 CLINICAL DATA:  Shortness of breath. EXAM: PORTABLE CHEST 1 VIEW COMPARISON:  Chest x-ray dated August 28, 2018. FINDINGS: Unchanged right chest wall port catheter and tunneled left internal jugular dialysis catheter. Stable cardiomegaly and pulmonary vascular congestion. Unchanged small bilateral pleural effusions. Slightly increased bibasilar atelectasis. No pneumothorax. No acute osseous abnormality. IMPRESSION: 1. Mild congestive heart failure, similar to prior study. Electronically Signed   By: Titus Dubin M.D.   On: 10/08/2018 08:07    ASSESSMENT: Progressive ovarian cancer, neutropenic fever, end-stage renal disease.  PLAN:    1. Stage IIIc high-grade serous ovarian carcinoma: Patient last received chemotherapy with single agent gemcitabine on October 06, 2018.  Her pancytopenia is likely secondary to treatment.  If patient is discharged from the hospital, she has been instructed to keep her follow-up appointment on October 13, 2018. 2.  End-stage renal disease: Patient seen and evaluated in dialysis.  Continue treatment per nephrology. 3.  Neutropenic fever: Patient's ANC is 1.0 today.  Agree with current antibiotics.  No obvious source, blood cultures are pending at time of dictation. 4.  Neutropenia: Secondary to chemotherapy, monitor. 5.  Anemia: Patient's is decreased, but relatively stable at 9.2.  Okay to give Procrit with dialysis if needed. 6.  Thrombocytopenia: Secondary to chemotherapy.  Monitor. 7.  Lower extremity edema: Likely from fluid overload, patient has an ultrasound Doppler scheduled for later today  for further evaluation.  Appreciate consult, will follow.   Cancer Staging Ovarian cancer, unspecified laterality ([place]) Staging form: Ovary, Fallopian Tube, and Primary Peritoneal Carcinoma, AJCC 8th Edition - Clinical: No stage assigned - Unsigned   Lloyd Huger, MD   10/09/2018 1:39 PM

## 2018-10-09 NOTE — Progress Notes (Signed)
Patient ID: Jessica Burgess, female   DOB: 1956/03/17, 63 y.o.   MRN: [id number]  [facility] PROGRESS NOTE  Jessica Burgess TIW:[id number] DOB: [birth year] DOA: 10/08/2018 PCP: Steele Sizer, MD  HPI/Subjective: Patient having shaking chills and fever.  Not feeling well.  Having abdominal pain.  Had shortness of breath yesterday.  Had a fever of 103 last night.  Not feeling well.  Objective: Vitals:   10/09/18 0510 10/09/18 0833  BP:  117/66  Pulse:  70  Resp:  (!) 22  Temp:  (!) 97.5 F (36.4 C)  SpO2: 99% 97%    Intake/Output Summary (Last 24 hours) at 10/09/2018 1238 Last data filed at 10/09/2018 1001 Gross per 24 hour  Intake 607.82 ml  Output 0 ml  Net 607.82 ml   Filed Weights   10/08/18 0717 10/08/18 1247 10/09/18 0422  Weight: 66.7 kg 70.7 kg 71.8 kg    ROS: Review of Systems  Constitutional: Positive for chills and fever.  Eyes: Negative for blurred vision.  Respiratory: Positive for shortness of breath. Negative for cough.   Cardiovascular: Negative for chest pain.  Gastrointestinal: Positive for abdominal pain. Negative for constipation, diarrhea, nausea and vomiting.  Genitourinary: Negative for dysuria.  Musculoskeletal: Negative for joint pain.  Neurological: Negative for dizziness and headaches.   Exam: Physical Exam  Constitutional: She is oriented to person, place, and time.  HENT:  Nose: No mucosal edema.  Mouth/Throat: No oropharyngeal exudate or posterior oropharyngeal edema.  Eyes: Pupils are equal, round, and reactive to light. Conjunctivae, EOM and lids are normal.  Neck: No JVD present. Carotid bruit is not present. No edema present. No thyroid mass and no thyromegaly present.  Cardiovascular: S1 normal and S2 normal. Exam reveals no gallop.  No murmur heard. Pulses:      Dorsalis pedis pulses are 2+ on the right side and 2+ on the left side.  Respiratory: No respiratory distress. She has decreased breath sounds in the right lower  field and the left lower field. She has no wheezes. She has no rhonchi. She has no rales.  GI: Soft. Bowel sounds are normal. She exhibits distension. There is abdominal tenderness.  Musculoskeletal:     Right ankle: She exhibits swelling.     Left ankle: She exhibits swelling.  Lymphadenopathy:    She has no cervical adenopathy.  Neurological: She is alert and oriented to person, place, and time. No cranial nerve deficit.  Skin: Skin is warm. No rash noted. Nails show no clubbing.  Psychiatric: She has a normal mood and affect.      Data Reviewed: Basic Metabolic Panel: Recent Labs  Lab 10/06/18 1313 10/08/18 0731 10/09/18 0546  NA 138 130* 130*  K 5.2* 5.0 5.8*  CL 97* 96* 93*  CO2 32 24 23  GLUCOSE 95 162* 81  BUN 18 46* 58*  CREATININE 2.59* 4.21* 4.85*  CALCIUM 8.3* 8.2* 8.2*   Liver Function Tests: Recent Labs  Lab 10/06/18 1313 10/08/18 0731  AST 26 33  ALT 26 24  ALKPHOS 91 95  BILITOT 0.8 0.9  PROT 5.8* 5.8*  ALBUMIN 3.0* 2.9*   No results for input(s): LIPASE, AMYLASE in the last 168 hours. No results for input(s): AMMONIA in the last 168 hours. CBC: Recent Labs  Lab 10/06/18 1312 10/08/18 0731 10/09/18 0546  WBC 4.7 3.0* 1.5*  NEUTROABS 3.4 2.6 1.0*  HGB 10.0* 10.0* 9.2*  HCT 32.5* 32.2* 29.6*  MCV 106.2* 103.2* 105.0*  PLT 100* 147*  125*   BNP (last 3 results) Recent Labs    08/28/18 0943 08/28/18 1443 10/08/18 0731  BNP >4,500.0* >4,500.0* 1,214.0*      Recent Results (from the past 240 hour(s))  SARS Coronavirus 2 (CEPHEID- Performed in [place] hospital lab), Hosp Order     Status: None   Collection Time: 10/08/18  7:31 AM   Specimen: Nasopharyngeal Swab  Result Value Ref Range Status   SARS Coronavirus 2 NEGATIVE NEGATIVE Final    Comment: (NOTE) If result is NEGATIVE SARS-CoV-2 target nucleic acids are NOT DETECTED. The SARS-CoV-2 RNA is generally detectable in upper and lower  respiratory specimens during the acute  phase of infection. The lowest  concentration of SARS-CoV-2 viral copies this assay can detect is 250  copies / mL. A negative result does not preclude SARS-CoV-2 infection  and should not be used as the sole basis for treatment or other  patient management decisions.  A negative result may occur with  improper specimen collection / handling, submission of specimen other  than nasopharyngeal swab, presence of viral mutation(s) within the  areas targeted by this assay, and inadequate number of viral copies  (<250 copies / mL). A negative result must be combined with clinical  observations, patient history, and epidemiological information. If result is POSITIVE SARS-CoV-2 target nucleic acids are DETECTED. The SARS-CoV-2 RNA is generally detectable in upper and lower  respiratory specimens dur ing the acute phase of infection.  Positive  results are indicative of active infection with SARS-CoV-2.  Clinical  correlation with patient history and other diagnostic information is  necessary to determine patient infection status.  Positive results do  not rule out bacterial infection or co-infection with other viruses. If result is PRESUMPTIVE POSTIVE SARS-CoV-2 nucleic acids MAY BE PRESENT.   A presumptive positive result was obtained on the submitted specimen  and confirmed on repeat testing.  While 2019 novel coronavirus  (SARS-CoV-2) nucleic acids may be present in the submitted sample  additional confirmatory testing may be necessary for epidemiological  and / or clinical management purposes  to differentiate between  SARS-CoV-2 and other Sarbecovirus currently known to infect humans.  If clinically indicated additional testing with an alternate test  methodology [phone]) is advised. The SARS-CoV-2 RNA is generally  detectable in upper and lower respiratory sp ecimens during the acute  phase of infection. The expected result is Negative. Fact Sheet for Patients:   StrictlyIdeas.no Fact Sheet for Healthcare Providers: BankingDealers.co.za This test is not yet approved or cleared by the [place] FDA and has been authorized for detection and/or diagnosis of SARS-CoV-2 by FDA under an Emergency Use Authorization (EUA).  This EUA will remain in effect (meaning this test can be used) for the duration of the COVID-19 declaration under Section 564(b)(1) of the Act, 21 U.S.C. section 360bbb-3(b)(1), unless the authorization is terminated or revoked sooner. Performed at [facility], [address] [place], [place] [address]   Blood Culture (routine x 2)     Status: None (Preliminary result)   Collection Time: 10/08/18  7:32 AM   Specimen: BLOOD  Result Value Ref Range Status   Specimen Description BLOOD RIGHT FA  Final   Special Requests   Final    BOTTLES DRAWN AEROBIC AND ANAEROBIC Blood Culture adequate volume   Culture   Final    NO GROWTH 1 DAY Performed at [facility], [address], [place], [place] [address]    Report Status PENDING  Incomplete  Blood Culture (routine x 2)     Status: None (Preliminary result)   Collection Time: 10/08/18  7:32 AM   Specimen: BLOOD  Result Value Ref Range Status   Specimen Description BLOOD RIGHT HAND  Final   Special Requests   Final    BOTTLES DRAWN AEROBIC AND ANAEROBIC Blood Culture results may not be optimal due to an excessive volume of blood received in culture bottles   Culture   Final    NO GROWTH 1 DAY Performed at [facility], [address], [place], [place] [address]    Report Status PENDING  Incomplete  Urine culture     Status: None   Collection Time: 10/08/18  8:35 AM   Specimen: Urine, Random  Result Value Ref Range Status   Specimen Description   Final    URINE, RANDOM Performed at [facility], [address] [place], [place] [address]    Special Requests   Final    NONE Performed at  [facility], [address], [place], [place] [address]    Culture   Final    NO GROWTH Performed at [facility], [place] [address] [place], [place] [address]    Report Status 10/09/2018 FINAL  Final  C difficile quick scan w PCR reflex     Status: None   Collection Time: 10/09/18  4:10 AM   Specimen: STOOL  Result Value Ref Range Status   C Diff antigen NEGATIVE NEGATIVE Final   C Diff toxin NEGATIVE NEGATIVE Final   C Diff interpretation No C. difficile detected.  Final    Comment: Performed at [facility], [address] [place], [place] [address]     Studies: Dg Chest [facility] 1 View  Result Date: 10/08/2018 CLINICAL DATA:  Shortness of breath. EXAM: PORTABLE CHEST 1 VIEW COMPARISON:  Chest x-ray dated August 28, 2018. FINDINGS: Unchanged right chest wall port catheter and tunneled left internal jugular dialysis catheter. Stable cardiomegaly and pulmonary vascular congestion. Unchanged small bilateral pleural effusions. Slightly increased bibasilar atelectasis. No pneumothorax. No acute osseous abnormality. IMPRESSION: 1. Mild congestive heart failure, similar to prior study. Electronically Signed   By: Titus Dubin M.D.   On: 10/08/2018 08:07    Scheduled Meds: . Chlorhexidine Gluconate Cloth  6 each Topical Q0600  . Chlorhexidine Gluconate Cloth  6 each Topical Q0600  . DULoxetine  60 mg Oral Daily  . ferrous sulfate  325 mg Oral Daily  . heparin  5,000 Units Subcutaneous Q8H  . hydrALAZINE  25 mg Oral TID  . ipratropium-albuterol  3 mL Nebulization Q6H  . isosorbide mononitrate  30 mg Oral Daily  . levothyroxine  25 mcg Oral QAC breakfast  . loratadine  10 mg Oral Daily  . magnesium oxide  400 mg Oral BID  . metoprolol succinate  50 mg Oral Daily  . sodium chloride flush  3 mL Intravenous Q12H  . vitamin B-12  1,000 mcg Oral Daily  . vitamin C  500 mg Oral Daily   Continuous Infusions: . sodium chloride Stopped (10/08/18 2113)  . sodium  chloride    . sodium chloride    . [START ON 10/10/2018] ceFEPime (MAXIPIME) IV    . vancomycin      Assessment/Plan:  1. Clinical sepsis and neutropenic fever.  Aggressive antibiotics with vancomycin and Maxipime.  Follow-up cultures.  With her abdominal pain I will get an ultrasound paracentesis to see if there is enough fluid to be  drawn off the abdomen.  Also get a sonogram of the lower extremity to rule out DVT.  2. Acute on chronic systolic congestive heart failure with shortness of breath.  Currently receiving dialysis and lying flat.  Dialysis to help manage fluid. 3. Ovarian cancer undergoing chemotherapy.  Last chemo on Tuesday.  Pancytopenia secondary to chemotherapy.  Patient listed as a full code. 4. End-stage renal disease on dialysis 5. Leg pain we will get an ultrasound of the lower extremity 6. Hypertension on metoprolol and hydralazine 7. Hypothyroidism unspecified on levothyroxine  Code Status:     Code Status Orders  (From admission, onward)         Start     Ordered   10/08/18 1047  Full code  Continuous     10/08/18 1049        Code Status History    Date Active Date Inactive Code Status Order ID Comments User Context   08/28/2018 1952 08/30/2018 1714 Full Code [id number]  Gorden Harms, MD Inpatient   08/06/2018 2355 08/18/2018 1650 Full Code [id number]  Mayer Camel, NP ED   Advance Care Planning Activity    Advance Directive Documentation     Most Recent Value  Type of Advance Directive  Healthcare Power of Attorney, Living will  Pre-existing out of facility DNR order (yellow form or pink MOST form)  -  "MOST" Form in Place?  -     Family Communication: Spoke with sister on the phone Disposition Plan: To be determined  Antibiotics:  Vancomycin  Cefepime  Time spent: 28 minutes  [place]

## 2018-10-09 NOTE — Progress Notes (Signed)
HD Tx Initiation:   10/09/18 1100  Vital Signs  Pulse Rate 83  Pulse Rate Source Monitor  Resp 17  BP 115/64  BP Location Right Arm  BP Method Automatic  Patient Position (if appropriate) Lying  Oxygen Therapy  SpO2 100 %  O2 Device Nasal Cannula  O2 Flow Rate (L/min) 2 L/min  Pain Assessment  Pain Scale 0-10  Pain Score 0  During Hemodialysis Assessment  Blood Flow Rate (mL/min) 400 mL/min  Arterial Pressure (mmHg) -200 mmHg  Venous Pressure (mmHg) 170 mmHg  Transmembrane Pressure (mmHg) 50 mmHg  Ultrafiltration Rate (mL/min) 800 mL/min  Dialysate Flow Rate (mL/min) 570 ml/min  Conductivity: Machine  13.9  HD Safety Checks Performed Yes  Intra-Hemodialysis Comments Tx initiated

## 2018-10-09 NOTE — Progress Notes (Signed)
cbc

## 2018-10-09 NOTE — Progress Notes (Signed)
Pre HD Tx:     10/09/18 1045  Vital Signs  Pulse Rate 81  Pulse Rate Source Monitor  Resp (!) 24  BP 128/66  BP Location Right Arm  BP Method Automatic  Patient Position (if appropriate) Lying  Oxygen Therapy  SpO2 100 %  O2 Device Nasal Cannula  O2 Flow Rate (L/min) 2 L/min  Pain Assessment  Pain Scale 0-10  Pain Score 0  Time-Out for Hemodialysis  What Procedure? HD  Pt Identifiers(min of two) First/Last Name;MRN/Account#;Pt's DOB(use if MRN/Acct# not available  Correct Site? Yes  Correct Side? Yes  Correct Procedure? Yes  Consents Verified? Yes  Rad Studies Available? Yes  Safety Precautions Reviewed? Yes  Engineer, civil (consulting) Number 5  Station Number 3  UF/Alarm Test Passed  Conductivity: Meter 13.8  Conductivity: Machine  13.9  pH 7.6  Reverse Osmosis main  Normal Saline Lot Number [id number]  Dialyzer Lot Number [id number]  Disposable Set Lot Number [id number]  Machine Temperature 98.6 F (37 C)  Musician and Audible Yes  Blood Lines Intact and Secured Yes  Pre Treatment Patient Checks  Vascular access used during treatment Catheter  HD catheter dressing before treatment WDL  Hepatitis B Surface Antigen Results Negative  Date Hepatitis B Surface Antigen Drawn 09/29/18  Isolation Initiated  (n/a)  Hepatitis B Surface Antibody  (>10)  Date Hepatitis B Surface Antibody Drawn 08/25/18  Hemodialysis Consent Verified Yes  Hemodialysis Standing Orders Initiated Yes  ECG (Telemetry) Monitor On Yes  Prime Ordered Normal Saline  Length of  DialysisTreatment -hour(s) 3.5 Hour(s)  Dialysis Treatment Comments  (Na 140)  Dialyzer Elisio 17H NR  Dialysate 2K;2.5 Ca  Dialysate Flow Ordered 800  Blood Flow Rate Ordered 400 mL/min  Ultrafiltration Goal 1.5 Liters  Dialysis Blood Pressure Support Ordered Normal Saline  Education / Care Plan  Dialysis Education Provided Yes  Documented Education in Care Plan Yes  Outpatient Plan of Care Reviewed and on Chart  Yes  Hemodialysis Catheter Left Subclavian Double-lumen  Placement Date/Time: 08/17/18 0800   Placed prior to admission: No  Person Inserting Catheter: Dr. Lucky Cowboy  Orientation: Left  Access Location: Subclavian  Hemodialysis Catheter Type: Double-lumen  Site Condition No complications  Blue Lumen Status Blood return noted  Red Lumen Status Blood return noted  Purple Lumen Status N/A  Dressing Type Biopatch;Occlusive  Dressing Status Clean;Dry;Intact  Drainage Description None  Dressing Change Due 10/16/18

## 2018-10-09 NOTE — Progress Notes (Signed)
Returned from thoracentesis.  T 101.8.  Tylenol given HR 160 in Afib.  Metoprolol 5 mg slow IVP without any benefit.  Cardiazem drip started at 5 mg/hr.  HR running in 140's.Marland Kitchen

## 2018-10-09 NOTE — Progress Notes (Signed)
Established hemodialysis patient known at [place] 6:15, patient drives self to treatment. Please note that any change in COVID or mobility may affect this plan. Please call me directly for any dialysis placement concerns.  Elvera Bicker Dialysis Coordinator  [phone]

## 2018-10-10 ENCOUNTER — Inpatient Hospital Stay: Payer: PPO

## 2018-10-10 LAB — BASIC METABOLIC PANEL
Anion gap: 9 (ref 5–15)
BUN: 24 mg/dL — ABNORMAL HIGH (ref 8–23)
CO2: 28 mmol/L (ref 22–32)
Calcium: 7.9 mg/dL — ABNORMAL LOW (ref 8.9–10.3)
Chloride: 95 mmol/L — ABNORMAL LOW (ref 98–111)
Creatinine, Ser: 2.87 mg/dL — ABNORMAL HIGH (ref 0.44–1.00)
GFR calc Af Amer: 19 mL/min — ABNORMAL LOW (ref >60)
GFR calc non Af Amer: 17 mL/min — ABNORMAL LOW (ref >60)
Glucose, Bld: 109 mg/dL — ABNORMAL HIGH (ref 70–99)
Potassium: 4.1 mmol/L (ref 3.5–5.1)
Sodium: 132 mmol/L — ABNORMAL LOW (ref 135–145)

## 2018-10-10 LAB — LACTATE DEHYDROGENASE: LDH: 223 U/L — ABNORMAL HIGH (ref 98–192)

## 2018-10-10 LAB — TSH: TSH: 3.767 u[IU]/mL (ref 0.350–4.500)

## 2018-10-10 LAB — PHOSPHORUS: Phosphorus: 3 mg/dL (ref 2.5–4.6)

## 2018-10-10 LAB — CBC
HCT: 26.3 % — ABNORMAL LOW (ref 36.0–46.0)
Hemoglobin: 8.4 g/dL — ABNORMAL LOW (ref 12.0–15.0)
MCH: 32.7 pg (ref 26.0–34.0)
MCHC: 31.9 g/dL (ref 30.0–36.0)
MCV: 102.3 fL — ABNORMAL HIGH (ref 80.0–100.0)
Platelets: 127 10*3/uL — ABNORMAL LOW (ref 150–400)
RBC: 2.57 MIL/uL — ABNORMAL LOW (ref 3.87–5.11)
RDW: 15.3 % (ref 11.5–15.5)
WBC: 1.4 10*3/uL — CL (ref 4.0–10.5)
nRBC: 0 % (ref 0.0–0.2)

## 2018-10-10 LAB — MAGNESIUM: Magnesium: 2.3 mg/dL (ref 1.7–2.4)

## 2018-10-10 MED ORDER — LORAZEPAM 0.5 MG PO TABS
0.5000 mg | ORAL_TABLET | Freq: Three times a day (TID) | ORAL | Status: DC | PRN
  Administered 2018-10-11 – 2018-10-12 (×4): 0.5 mg via ORAL
  Filled 2018-10-10 (×4): qty 1

## 2018-10-10 MED ORDER — EPOETIN ALFA 10000 UNIT/ML IJ SOLN
10000.0000 [IU] | INTRAMUSCULAR | Status: DC
  Administered 2018-10-13: 10000 [IU] via INTRAVENOUS
  Filled 2018-10-10: qty 1

## 2018-10-10 MED ORDER — DILTIAZEM HCL 25 MG/5ML IV SOLN
10.0000 mg | Freq: Once | INTRAVENOUS | Status: AC
  Administered 2018-10-10: 10 mg via INTRAVENOUS
  Filled 2018-10-10: qty 5

## 2018-10-10 MED ORDER — SODIUM CHLORIDE 0.9% FLUSH
10.0000 mL | INTRAVENOUS | Status: DC | PRN
  Administered 2018-10-10: 10 mL
  Filled 2018-10-10: qty 40

## 2018-10-10 MED ORDER — LORAZEPAM 1 MG PO TABS
1.0000 mg | ORAL_TABLET | ORAL | Status: AC
  Administered 2018-10-10: 1 mg via ORAL
  Filled 2018-10-10: qty 1

## 2018-10-10 NOTE — Progress Notes (Signed)
Patient ID: Jessica Burgess, female   DOB: Jul 01, 1955, 63 y.o.   MRN: [id number]  [facility] PROGRESS NOTE  MAGDELYN ROEBUCK LTJ:[id number] DOB: April 19, 1955 DOA: 10/08/2018 PCP: Steele Sizer, MD  HPI/Subjective: Patient very anxious had fevers again yesterday  Objective: Vitals:   10/10/18 0917 10/10/18 1358  BP:  (!) 154/77  Pulse: 82 91  Resp:  19  Temp:  98.1 F (36.7 C)  SpO2:  92%   No intake or output data in the 24 hours ending 10/10/18 1506 Filed Weights   10/08/18 1247 10/09/18 0422 10/10/18 0427  Weight: 70.7 kg 71.8 kg 71 kg    ROS: Review of Systems  Constitutional: Positive for chills and fever.  Eyes: Negative for blurred vision.  Respiratory: Positive for shortness of breath. Negative for cough.   Cardiovascular: Negative for chest pain.  Gastrointestinal: Positive for abdominal pain. Negative for constipation, diarrhea, nausea and vomiting.  Genitourinary: Negative for dysuria.  Musculoskeletal: Negative for joint pain.  Neurological: Negative for dizziness and headaches.   Exam: Physical Exam  Constitutional: She is oriented to person, place, and time.  HENT:  Nose: No mucosal edema.  Mouth/Throat: No oropharyngeal exudate or posterior oropharyngeal edema.  Eyes: Pupils are equal, round, and reactive to light. Conjunctivae, EOM and lids are normal.  Neck: No JVD present. Carotid bruit is not present. No edema present. No thyroid mass and no thyromegaly present.  Cardiovascular: S1 normal and S2 normal. Exam reveals no gallop.  No murmur heard. Pulses:      Dorsalis pedis pulses are 2+ on the right side and 2+ on the left side.  Respiratory: No respiratory distress. She has decreased breath sounds in the right lower field and the left lower field. She has no wheezes. She has no rhonchi. She has no rales.  GI: Soft. Bowel sounds are normal. She exhibits distension. There is abdominal tenderness.  Musculoskeletal:     Right ankle: She exhibits  swelling.     Left ankle: She exhibits swelling.  Lymphadenopathy:    She has no cervical adenopathy.  Neurological: She is alert and oriented to person, place, and time. No cranial nerve deficit.  Skin: Skin is warm. No rash noted. Nails show no clubbing.  Psychiatric: She has a normal mood and affect.      Data Reviewed: Basic Metabolic Panel: Recent Labs  Lab 10/06/18 1313 10/08/18 0731 10/09/18 0546 10/10/18 0644  NA 138 130* 130* 132*  K 5.2* 5.0 5.8* 4.1  CL 97* 96* 93* 95*  CO2 32 24 23 28   GLUCOSE 95 162* 81 109*  BUN 18 46* 58* 24*  CREATININE 2.59* 4.21* 4.85* 2.87*  CALCIUM 8.3* 8.2* 8.2* 7.9*  MG  --   --   --  2.3   Liver Function Tests: Recent Labs  Lab 10/06/18 1313 10/08/18 0731  AST 26 33  ALT 26 24  ALKPHOS 91 95  BILITOT 0.8 0.9  PROT 5.8* 5.8*  ALBUMIN 3.0* 2.9*   No results for input(s): LIPASE, AMYLASE in the last 168 hours. No results for input(s): AMMONIA in the last 168 hours. CBC: Recent Labs  Lab 10/06/18 1312 10/08/18 0731 10/09/18 0546 10/10/18 0644  WBC 4.7 3.0* 1.5* 1.4*  NEUTROABS 3.4 2.6 1.0*  --   HGB 10.0* 10.0* 9.2* 8.4*  HCT 32.5* 32.2* 29.6* 26.3*  MCV 106.2* 103.2* 105.0* 102.3*  PLT 100* 147* 125* 127*   BNP (last 3 results) Recent Labs    08/28/18 0943 08/28/18  1443 10/08/18 0731  BNP >4,500.0* >4,500.0* 1,214.0*      Recent Results (from the past 240 hour(s))  SARS Coronavirus 2 (CEPHEID- Performed in [place] hospital lab), Hosp Order     Status: None   Collection Time: 10/08/18  7:31 AM   Specimen: Nasopharyngeal Swab  Result Value Ref Range Status   SARS Coronavirus 2 NEGATIVE NEGATIVE Final    Comment: (NOTE) If result is NEGATIVE SARS-CoV-2 target nucleic acids are NOT DETECTED. The SARS-CoV-2 RNA is generally detectable in upper and lower  respiratory specimens during the acute phase of infection. The lowest  concentration of SARS-CoV-2 viral copies this assay can detect is 250  copies /  mL. A negative result does not preclude SARS-CoV-2 infection  and should not be used as the sole basis for treatment or other  patient management decisions.  A negative result may occur with  improper specimen collection / handling, submission of specimen other  than nasopharyngeal swab, presence of viral mutation(s) within the  areas targeted by this assay, and inadequate number of viral copies  (<250 copies / mL). A negative result must be combined with clinical  observations, patient history, and epidemiological information. If result is POSITIVE SARS-CoV-2 target nucleic acids are DETECTED. The SARS-CoV-2 RNA is generally detectable in upper and lower  respiratory specimens dur ing the acute phase of infection.  Positive  results are indicative of active infection with SARS-CoV-2.  Clinical  correlation with patient history and other diagnostic information is  necessary to determine patient infection status.  Positive results do  not rule out bacterial infection or co-infection with other viruses. If result is PRESUMPTIVE POSTIVE SARS-CoV-2 nucleic acids MAY BE PRESENT.   A presumptive positive result was obtained on the submitted specimen  and confirmed on repeat testing.  While 2019 novel coronavirus  (SARS-CoV-2) nucleic acids may be present in the submitted sample  additional confirmatory testing may be necessary for epidemiological  and / or clinical management purposes  to differentiate between  SARS-CoV-2 and other Sarbecovirus currently known to infect humans.  If clinically indicated additional testing with an alternate test  methodology [phone]) is advised. The SARS-CoV-2 RNA is generally  detectable in upper and lower respiratory sp ecimens during the acute  phase of infection. The expected result is Negative. Fact Sheet for Patients:  StrictlyIdeas.no Fact Sheet for Healthcare Providers: BankingDealers.co.za This test is  not yet approved or cleared by the [place] FDA and has been authorized for detection and/or diagnosis of SARS-CoV-2 by FDA under an Emergency Use Authorization (EUA).  This EUA will remain in effect (meaning this test can be used) for the duration of the COVID-19 declaration under Section 564(b)(1) of the Act, 21 U.S.C. section 360bbb-3(b)(1), unless the authorization is terminated or revoked sooner. Performed at [facility], [address] [place], [place] [address]   Blood Culture (routine x 2)     Status: None (Preliminary result)   Collection Time: 10/08/18  7:32 AM   Specimen: BLOOD  Result Value Ref Range Status   Specimen Description BLOOD RIGHT FA  Final   Special Requests   Final    BOTTLES DRAWN AEROBIC AND ANAEROBIC Blood Culture adequate volume   Culture   Final    NO GROWTH 2 DAYS Performed at [facility], [address] [place], [place] [address]    Report Status PENDING  Incomplete  Blood Culture (routine x 2)     Status: None (Preliminary result)  Collection Time: 10/08/18  7:32 AM   Specimen: BLOOD  Result Value Ref Range Status   Specimen Description BLOOD RIGHT HAND  Final   Special Requests   Final    BOTTLES DRAWN AEROBIC AND ANAEROBIC Blood Culture results may not be optimal due to an excessive volume of blood received in culture bottles   Culture   Final    NO GROWTH 2 DAYS Performed at [facility], [address] [place], [place] [address]    Report Status PENDING  Incomplete  Urine culture     Status: None   Collection Time: 10/08/18  8:35 AM   Specimen: Urine, Random  Result Value Ref Range Status   Specimen Description   Final    URINE, RANDOM Performed at [facility], [address], [place], [place] [address]    Special Requests   Final    NONE Performed at [facility], [address] [place], [place] [address]    Culture   Final    NO GROWTH Performed at [facility], [place] [address], [place], [place] [address]    Report Status 10/09/2018 FINAL  Final  C difficile quick scan w PCR reflex     Status: None   Collection Time: 10/09/18  4:10 AM   Specimen: STOOL  Result Value Ref Range Status   C Diff antigen NEGATIVE NEGATIVE Final   C Diff toxin NEGATIVE NEGATIVE Final   C Diff interpretation No C. difficile detected.  Final    Comment: Performed at [facility], [address] [place], [place] [address]  Body fluid culture     Status: None (Preliminary result)   Collection Time: 10/09/18  5:00 PM   Specimen: [facility] Cytology Pleural fluid  Result Value Ref Range Status   Specimen Description   Final    PLEURAL Performed at [facility], [address], [place], [place] [address]    Special Requests   Final    NONE Performed at [facility], [place], [place] [address]    Gram Stain   Final    FEW WBC PRESENT,BOTH PMN AND MONONUCLEAR NO ORGANISMS SEEN    Culture   Final    NO GROWTH < 24 HOURS Performed at [facility], [place] [address], [place], [place] [address]    Report Status PENDING  Incomplete     Studies: US Abdomen Limited  Result Date: 10/09/2018 CLINICAL DATA:  History of end-stage renal disease, now with bilateral pleural effusions and intra-abdominal ascites. Please perform ascites search ultrasound and ultrasound-guided paracentesis as indicated. EXAM: LIMITED ABDOMEN ULTRASOUND FOR ASCITES TECHNIQUE: Limited ultrasound survey for ascites was performed in all four abdominal quadrants. COMPARISON:  CT abdomen pelvis-09/21/2018 FINDINGS: Sonographic evaluation demonstrates a trace amount of intra-abdominal ascites, too small to allow for safe ultrasound-guided paracentesis. Note is made of small bilateral pleural effusions, right greater than left. IMPRESSION: 1. Trace amount of intra-abdominal ascites, too small to allow for safe ultrasound-guided paracentesis. 2. Note is made of small  bilateral pleural effusions, right greater than left. Patient subsequently underwent ultrasound-guided right-sided thoracentesis. Electronically Signed   By: Sandi Mariscal M.D.   On: 10/09/2018 16:59   US Venous Img Lower Bilateral  Result Date: 10/09/2018 CLINICAL DATA:  Bilateral lower extremity pain and edema. EXAM: BILATERAL LOWER EXTREMITY VENOUS DOPPLER ULTRASOUND TECHNIQUE: Gray-scale sonography with graded compression, as well as color Doppler and duplex ultrasound were performed to evaluate the  lower extremity deep venous systems from the level of the common femoral vein and including the common femoral, femoral, profunda femoral, popliteal and calf veins including the posterior tibial, peroneal and gastrocnemius veins when visible. The superficial great saphenous vein was also interrogated. Spectral Doppler was utilized to evaluate flow at rest and with distal augmentation maneuvers in the common femoral, femoral and popliteal veins. COMPARISON:  None. FINDINGS: RIGHT LOWER EXTREMITY Common Femoral Vein: No evidence of thrombus. Normal compressibility, respiratory phasicity and response to augmentation. Saphenofemoral Junction: No evidence of thrombus. Normal compressibility and flow on color Doppler imaging. Profunda Femoral Vein: No evidence of thrombus. Normal compressibility and flow on color Doppler imaging. Femoral Vein: No evidence of thrombus. Normal compressibility, respiratory phasicity and response to augmentation. Popliteal Vein: No evidence of thrombus. Normal compressibility, respiratory phasicity and response to augmentation. Calf Veins: No evidence of thrombus. Normal compressibility and flow on color Doppler imaging. Superficial Great Saphenous Vein: No evidence of thrombus. Normal compressibility. Venous Reflux:  None. Other Findings: No evidence of superficial thrombophlebitis or abnormal fluid collection. LEFT LOWER EXTREMITY Common Femoral Vein: No evidence of thrombus. Normal  compressibility, respiratory phasicity and response to augmentation. Saphenofemoral Junction: No evidence of thrombus. Normal compressibility and flow on color Doppler imaging. Profunda Femoral Vein: No evidence of thrombus. Normal compressibility and flow on color Doppler imaging. Femoral Vein: No evidence of thrombus. Normal compressibility, respiratory phasicity and response to augmentation. Popliteal Vein: No evidence of thrombus. Normal compressibility, respiratory phasicity and response to augmentation. Calf Veins: No evidence of thrombus. Normal compressibility and flow on color Doppler imaging. Superficial Great Saphenous Vein: No evidence of thrombus. Normal compressibility. Venous Reflux:  None. Other Findings: No evidence of superficial thrombophlebitis or abnormal fluid collection. IMPRESSION: No evidence of deep venous thrombosis in either lower extremity. Electronically Signed   By: Aletta Edouard M.D.   On: 10/09/2018 17:41   Dg Chest Port 1 View  Result Date: 10/09/2018 CLINICAL DATA:  Shortness of breath and chest pain, history of recent thoracentesis EXAM: PORTABLE CHEST 1 VIEW COMPARISON:  Film from earlier in the same day. FINDINGS: Cardiac shadow is again enlarged. Left-sided pleural effusion is again seen and slightly larger than that noted on the prior study. Dialysis catheter and right-sided chest wall port are again seen. No pneumothorax is noted. Mild vascular congestion is seen. IMPRESSION: Slight increase in left-sided pleural effusion. This may be positional in nature. Mild vascular congestion.  No pneumothorax is noted. Electronically Signed   By: Inez Catalina M.D.   On: 10/09/2018 19:45   Dg Chest Port 1 View  Result Date: 10/09/2018 CLINICAL DATA:  Status post right thoracentesis EXAM: PORTABLE CHEST 1 VIEW COMPARISON:  10/08/2018 FINDINGS: Cardiac shadow is stable. Dialysis catheter and right-sided chest wall port are again seen and stable. Postsurgical changes in the  cervical spine are noted. Small left pleural effusion is noted. Right-sided pleural effusion has resolved following thoracentesis. No pneumothorax is noted. IMPRESSION: No pneumothorax following right thoracentesis. Small left pleural effusion. Electronically Signed   By: Inez Catalina M.D.   On: 10/09/2018 16:53   US Thoracentesis Asp Pleural Space W/img Guide  Result Date: 10/09/2018 INDICATION: History of end-stage renal disease, now with symptomatic pleural effusions. Please perform chest ultrasound and ultrasound-guided thoracentesis of the side with greater volume of fluid for therapeutic and diagnostic purposes. EXAM: US THORACENTESIS ASP PLEURAL SPACE W/IMG GUIDE COMPARISON:  Chest radiograph-10/08/2018; CT abdomen pelvis-09/21/2018 MEDICATIONS: None. COMPLICATIONS: None immediate. TECHNIQUE: Informed written consent was obtained  from the patient after a discussion of the risks, benefits and alternatives to treatment. A timeout was performed prior to the initiation of the procedure. Initial ultrasound scanning demonstrates small bilateral pleural effusions, right greater than left. As such the decision was made to proceed with ultrasound-guided right-sided thoracentesis. With the patient positioned left lateral decubitus, the posterolateral aspect of the right chest was prepped and draped in the usual sterile fashion. 1% lidocaine was used for local anesthesia. An ultrasound image was saved for documentation purposes. An 8 Fr Safe-T-Centesis catheter was introduced. The thoracentesis was performed. The catheter was removed and a dressing was applied. The patient tolerated the procedure well without immediate post procedural complication. The patient was escorted to have an upright chest radiograph. FINDINGS: A total of approximately 700 cc liters of serous fluid was removed. Requested samples were sent to the laboratory. IMPRESSION: Successful ultrasound-guided right sided thoracentesis yielding 700 cc of  pleural fluid. Electronically Signed   By: Sandi Mariscal M.D.   On: 10/09/2018 17:01    Scheduled Meds: . Chlorhexidine Gluconate Cloth  6 each Topical Q0600  . Chlorhexidine Gluconate Cloth  6 each Topical Q0600  . DULoxetine  60 mg Oral Daily  . [START ON 10/13/2018] epoetin (EPOGEN/PROCRIT) injection  10,000 Units Intravenous Q T,Th,Sa-HD  . ferrous sulfate  325 mg Oral Daily  . heparin  5,000 Units Subcutaneous Q8H  . hydrALAZINE  25 mg Oral TID  . ipratropium-albuterol  3 mL Nebulization Q6H  . isosorbide mononitrate  30 mg Oral Daily  . levothyroxine  25 mcg Oral QAC breakfast  . loratadine  10 mg Oral Daily  . magnesium oxide  400 mg Oral BID  . metoprolol succinate  50 mg Oral Daily  . sodium chloride flush  3 mL Intravenous Q12H  . vitamin B-12  1,000 mcg Oral Daily  . vitamin C  500 mg Oral Daily   Continuous Infusions: . sodium chloride 500 mL (10/09/18 1819)  . ceFEPime (MAXIPIME) IV 2 g (10/10/18 1159)  . diltiazem (CARDIZEM) infusion 5 mg/hr (10/10/18 0834)  . vancomycin 750 mg (10/09/18 2257)    Assessment/Plan:  1. Clinical sepsis and neutropenic fever.  Continue vancomycin and Maxipime cultures are negative to date if continues to have fever will need ID consult on Monday 2. Pleural effusion status post thoracentesis LDH is elevated possible malignant pleural effusion 3. acute on chronic systolic congestive heart failure with shortness of breath.  Currently receiving dialysis and lying flat.  Dialysis to help manage fluid. 4. Ovarian cancer undergoing chemotherapy.  Last chemo on Tuesday.  Pancytopenia secondary to chemotherapy.  Patient listed as a full code. 5. End-stage renal disease on dialysis 6. Leg pain we will get an ultrasound of the lower extremity 7. Hypertension on metoprolol and hydralazine 8. Hypothyroidism unspecified on levothyroxine  Code Status:     Code Status Orders  (From admission, onward)         Start     Ordered   10/08/18 1047   Full code  Continuous     10/08/18 1049        Code Status History    Date Active Date Inactive Code Status Order ID Comments User Context   08/28/2018 1952 08/30/2018 1714 Full Code [id number]  Gorden Harms, MD Inpatient   08/06/2018 2355 08/18/2018 1650 Full Code [id number]  Mayer Camel, NP ED   Advance Care Planning Activity    Advance Directive Documentation     Most Recent Value  Type of Advance Directive  Healthcare Power of Attorney, Living will  Pre-existing out of facility DNR order (yellow form or pink MOST form)  -  "MOST" Form in Place?  -     Family Communication: Spoke with sister on the phone Disposition Plan: To be determined  Antibiotics:  Vancomycin  Cefepime  Time spent: 28 minutes  [place]

## 2018-10-10 NOTE — Progress Notes (Addendum)
MD Jannifer Franklin made aware of troponin level of 60, EKG and CXR results. Also made aware that patients HR is still in 120-130 however BP is now soft. MD to place orders for IV Digoxin. Will continue to monitor.   Update: Patient requesting that RN take out PIV due to pain at site. Patient is also requesting that her port be accessed instead of having another PIV placed. Orders placed to IV Team. Will continue to monitor.  Iran Sizer M

## 2018-10-10 NOTE — Plan of Care (Signed)
  Problem: Clinical Measurements: Goal: Cardiovascular complication will be avoided Outcome: Progressing Note: HR 100-110s on cardizem drip.    Problem: Coping: Goal: Level of anxiety will decrease Outcome: Progressing   Problem: Pain Managment: Goal: General experience of comfort will improve Outcome: Progressing   Problem: Safety: Goal: Ability to remain free from injury will improve Outcome: Progressing

## 2018-10-10 NOTE — Progress Notes (Signed)
HD Tx Start    10/10/18 1730  Hand-Off documentation  Report given to (Full Name) Trellis Paganini RN  Report received from (Full Name) Colletta Maryland RN   Vital Signs  Temp 99.1 F (37.3 C)  Temp Source Oral  Pulse Rate 90  Pulse Rate Source Monitor  Resp (!) 22  BP (!) 155/78  BP Location Left Arm  BP Method Automatic  Patient Position (if appropriate) Lying  Oxygen Therapy  SpO2 93 %  O2 Device Nasal Cannula  O2 Flow Rate (L/min) 2 L/min  Pain Assessment  Pain Scale 0-10  Pain Score 0  Dialysis Weight  Weight 71 kg  Type of Weight Pre-Dialysis  During Hemodialysis Assessment  Blood Flow Rate (mL/min) 200 mL/min  Arterial Pressure (mmHg) -60 mmHg  Venous Pressure (mmHg) 60 mmHg  Transmembrane Pressure (mmHg) 50 mmHg  Ultrafiltration Rate (mL/min) 530 mL/min  Dialysate Flow Rate (mL/min) 800 ml/min  Conductivity: Machine  14  HD Safety Checks Performed Yes  Dialysis Fluid Bolus Normal Saline  Bolus Amount (mL) 250 mL  Intra-Hemodialysis Comments Tx initiated  Education / Care Plan  Dialysis Education Provided Yes  Documented Education in Care Plan Yes  Hemodialysis Catheter Left Subclavian Double-lumen  Placement Date/Time: 08/17/18 0800   Placed prior to admission: No  Person Inserting Catheter: Dr. Lucky Cowboy  Orientation: Left  Access Location: Subclavian  Hemodialysis Catheter Type: Double-lumen  Site Condition No complications  Blue Lumen Status Infusing  Red Lumen Status Infusing  Purple Lumen Status N/A  Dressing Type Biopatch;Occlusive  Dressing Status Clean;Dry;Intact  Drainage Description None  Dressing Change Due 10/16/18

## 2018-10-10 NOTE — Progress Notes (Signed)
Patient WBC count this AM called with critical result to this RN.  WBC 1.4. Dr. Posey Pronto made aware. No new orders received.

## 2018-10-10 NOTE — Progress Notes (Signed)
[facility]  ROUNDING NOTE   Subjective:  Less short of breath today. She did have dialysis session yesterday. We will plan for another dialysis session today.    Objective:  Vital signs in last 24 hours:  Temp:  [97.7 F (36.5 C)-101.8 F (38.8 C)] 98.5 F (36.9 C) (07/25 0739) Pulse Rate:  [75-168] 82 (07/25 0917) Resp:  [19-32] 19 (07/25 0739) BP: (91-170)/(56-158) 116/56 (07/25 0739) SpO2:  [93 %-100 %] 98 % (07/25 0739) Weight:  [71 kg] 71 kg (07/25 0427)  Weight change: 4.321 kg Filed Weights   10/08/18 1247 10/09/18 0422 10/10/18 0427  Weight: 70.7 kg 71.8 kg 71 kg    Intake/Output: I/O last 3 completed shifts: In: 607.8 [P.O.:480; I.V.:27.8; IV Piggyback:100] Out: 4696 [Other:1516]   Intake/Output this shift:  No intake/output data recorded.  Physical Exam: General: No acute distress  Head: Normocephalic, atraumatic. Moist oral mucosal membranes  Eyes: Anicteric  Neck: Supple, trachea midline  Lungs:  Bilateral rhonchi and rales, normal effort  Heart: S1S2 no rubs  Abdomen:  Soft, nontender, bowel sounds present  Extremities: 2+ peripheral edema.  Neurologic: Awake, alert, following commands  Skin: No lesions  Access:  IJ PermCath    Basic Metabolic Panel: Recent Labs  Lab 10/06/18 1313 10/08/18 0731 10/09/18 0546 10/10/18 0644  NA 138 130* 130* 132*  K 5.2* 5.0 5.8* 4.1  CL 97* 96* 93* 95*  CO2 32 24 23 28   GLUCOSE 95 162* 81 109*  BUN 18 46* 58* 24*  CREATININE 2.59* 4.21* 4.85* 2.87*  CALCIUM 8.3* 8.2* 8.2* 7.9*  MG  --   --   --  2.3    Liver Function Tests: Recent Labs  Lab 10/06/18 1313 10/08/18 0731  AST 26 33  ALT 26 24  ALKPHOS 91 95  BILITOT 0.8 0.9  PROT 5.8* 5.8*  ALBUMIN 3.0* 2.9*   No results for input(s): LIPASE, AMYLASE in the last 168 hours. No results for input(s): AMMONIA in the last 168 hours.  CBC: Recent Labs  Lab 10/06/18 1312 10/08/18 0731 10/09/18 0546 10/10/18 0644  WBC 4.7 3.0*  1.5* 1.4*  NEUTROABS 3.4 2.6 1.0*  --   HGB 10.0* 10.0* 9.2* 8.4*  HCT 32.5* 32.2* 29.6* 26.3*  MCV 106.2* 103.2* 105.0* 102.3*  PLT 100* 147* 125* 127*    Cardiac Enzymes: No results for input(s): CKTOTAL, CKMB, CKMBINDEX, TROPONINI in the last 168 hours.  BNP: Invalid input(s): POCBNP  CBG: No results for input(s): GLUCAP in the last 168 hours.  Microbiology: Results for orders placed or performed during the hospital encounter of 10/08/18  SARS Coronavirus 2 (CEPHEID- Performed in [place] hospital lab), Hosp Order     Status: None   Collection Time: 10/08/18  7:31 AM   Specimen: Nasopharyngeal Swab  Result Value Ref Range Status   SARS Coronavirus 2 NEGATIVE NEGATIVE Final    Comment: (NOTE) If result is NEGATIVE SARS-CoV-2 target nucleic acids are NOT DETECTED. The SARS-CoV-2 RNA is generally detectable in upper and lower  respiratory specimens during the acute phase of infection. The lowest  concentration of SARS-CoV-2 viral copies this assay can detect is 250  copies / mL. A negative result does not preclude SARS-CoV-2 infection  and should not be used as the sole basis for treatment or other  patient management decisions.  A negative result may occur with  improper specimen collection / handling, submission of specimen other  than nasopharyngeal swab, presence of viral mutation(s) within the  areas targeted by this assay, and inadequate number of viral copies  (<250 copies / mL). A negative result must be combined with clinical  observations, patient history, and epidemiological information. If result is POSITIVE SARS-CoV-2 target nucleic acids are DETECTED. The SARS-CoV-2 RNA is generally detectable in upper and lower  respiratory specimens dur ing the acute phase of infection.  Positive  results are indicative of active infection with SARS-CoV-2.  Clinical  correlation with patient history and other diagnostic information is  necessary to determine patient  infection status.  Positive results do  not rule out bacterial infection or co-infection with other viruses. If result is PRESUMPTIVE POSTIVE SARS-CoV-2 nucleic acids MAY BE PRESENT.   A presumptive positive result was obtained on the submitted specimen  and confirmed on repeat testing.  While 2019 novel coronavirus  (SARS-CoV-2) nucleic acids may be present in the submitted sample  additional confirmatory testing may be necessary for epidemiological  and / or clinical management purposes  to differentiate between  SARS-CoV-2 and other Sarbecovirus currently known to infect humans.  If clinically indicated additional testing with an alternate test  methodology [phone]) is advised. The SARS-CoV-2 RNA is generally  detectable in upper and lower respiratory sp ecimens during the acute  phase of infection. The expected result is Negative. Fact Sheet for Patients:  StrictlyIdeas.no Fact Sheet for Healthcare Providers: BankingDealers.co.za This test is not yet approved or cleared by the [place] FDA and has been authorized for detection and/or diagnosis of SARS-CoV-2 by FDA under an Emergency Use Authorization (EUA).  This EUA will remain in effect (meaning this test can be used) for the duration of the COVID-19 declaration under Section 564(b)(1) of the Act, 21 U.S.C. section 360bbb-3(b)(1), unless the authorization is terminated or revoked sooner. Performed at [facility], [address] [place], [place] [address]   Blood Culture (routine x 2)     Status: None (Preliminary result)   Collection Time: 10/08/18  7:32 AM   Specimen: BLOOD  Result Value Ref Range Status   Specimen Description BLOOD RIGHT FA  Final   Special Requests   Final    BOTTLES DRAWN AEROBIC AND ANAEROBIC Blood Culture adequate volume   Culture   Final    NO GROWTH 2 DAYS Performed at [facility], [address] [place], [place]  [address]    Report Status PENDING  Incomplete  Blood Culture (routine x 2)     Status: None (Preliminary result)   Collection Time: 10/08/18  7:32 AM   Specimen: BLOOD  Result Value Ref Range Status   Specimen Description BLOOD RIGHT HAND  Final   Special Requests   Final    BOTTLES DRAWN AEROBIC AND ANAEROBIC Blood Culture results may not be optimal due to an excessive volume of blood received in culture bottles   Culture   Final    NO GROWTH 2 DAYS Performed at [facility], [address] [place], [place] [address]    Report Status PENDING  Incomplete  Urine culture     Status: None   Collection Time: 10/08/18  8:35 AM   Specimen: Urine, Random  Result Value Ref Range Status   Specimen Description   Final    URINE, RANDOM Performed at [facility], [address] [place],  [address]    Special Requests   Final    NONE Performed at [facility], [address], [place],  [address]    Culture  Final    NO GROWTH Performed at [facility], [place] [address], [place], [place] [address]    Report Status 10/09/2018 FINAL  Final  C difficile quick scan w PCR reflex     Status: None   Collection Time: 10/09/18  4:10 AM   Specimen: STOOL  Result Value Ref Range Status   C Diff antigen NEGATIVE NEGATIVE Final   C Diff toxin NEGATIVE NEGATIVE Final   C Diff interpretation No C. difficile detected.  Final    Comment: Performed at [facility], [address] [place], [place] [address]  Body fluid culture     Status: None (Preliminary result)   Collection Time: 10/09/18  5:00 PM   Specimen: [facility] Cytology Pleural fluid  Result Value Ref Range Status   Specimen Description   Final    PLEURAL Performed at [facility], [address] [place], [place] [address]    Special Requests   Final    NONE Performed at [facility], [place], [place] [address]    Gram Stain   Final    FEW WBC  PRESENT,BOTH PMN AND MONONUCLEAR NO ORGANISMS SEEN    Culture   Final    NO GROWTH < 24 HOURS Performed at [facility], [place] [address] [place],  [address]    Report Status PENDING  Incomplete    Coagulation Studies: No results for input(s): LABPROT, INR in the last 72 hours.  Urinalysis: Recent Labs    10/08/18 0835  COLORURINE YELLOW*  LABSPEC 1.017  PHURINE 5.0  GLUCOSEU NEGATIVE  HGBUR MODERATE*  BILIRUBINUR NEGATIVE  KETONESUR NEGATIVE  PROTEINUR 100*  NITRITE NEGATIVE  LEUKOCYTESUR NEGATIVE      Imaging: US Abdomen Limited  Result Date: 10/09/2018 CLINICAL DATA:  History of end-stage renal disease, now with bilateral pleural effusions and intra-abdominal ascites. Please perform ascites search ultrasound and ultrasound-guided paracentesis as indicated. EXAM: LIMITED ABDOMEN ULTRASOUND FOR ASCITES TECHNIQUE: Limited ultrasound survey for ascites was performed in all four abdominal quadrants. COMPARISON:  CT abdomen pelvis-09/21/2018 FINDINGS: Sonographic evaluation demonstrates a trace amount of intra-abdominal ascites, too small to allow for safe ultrasound-guided paracentesis. Note is made of small bilateral pleural effusions, right greater than left. IMPRESSION: 1. Trace amount of intra-abdominal ascites, too small to allow for safe ultrasound-guided paracentesis. 2. Note is made of small bilateral pleural effusions, right greater than left. Patient subsequently underwent ultrasound-guided right-sided thoracentesis. Electronically Signed   By: Sandi Mariscal M.D.   On: 10/09/2018 16:59   US Venous Img Lower Bilateral  Result Date: 10/09/2018 CLINICAL DATA:  Bilateral lower extremity pain and edema. EXAM: BILATERAL LOWER EXTREMITY VENOUS DOPPLER ULTRASOUND TECHNIQUE: Gray-scale sonography with graded compression, as well as color Doppler and duplex ultrasound were performed to evaluate the lower extremity deep venous systems from the level of the common femoral  vein and including the common femoral, femoral, profunda femoral, popliteal and calf veins including the posterior tibial, peroneal and gastrocnemius veins when visible. The superficial great saphenous vein was also interrogated. Spectral Doppler was utilized to evaluate flow at rest and with distal augmentation maneuvers in the common femoral, femoral and popliteal veins. COMPARISON:  None. FINDINGS: RIGHT LOWER EXTREMITY Common Femoral Vein: No evidence of thrombus. Normal compressibility, respiratory phasicity and response to augmentation. Saphenofemoral Junction: No evidence of thrombus. Normal compressibility and flow on color Doppler imaging. Profunda Femoral Vein: No evidence of thrombus. Normal compressibility and flow on color Doppler imaging. Femoral  Vein: No evidence of thrombus. Normal compressibility, respiratory phasicity and response to augmentation. Popliteal Vein: No evidence of thrombus. Normal compressibility, respiratory phasicity and response to augmentation. Calf Veins: No evidence of thrombus. Normal compressibility and flow on color Doppler imaging. Superficial Great Saphenous Vein: No evidence of thrombus. Normal compressibility. Venous Reflux:  None. Other Findings: No evidence of superficial thrombophlebitis or abnormal fluid collection. LEFT LOWER EXTREMITY Common Femoral Vein: No evidence of thrombus. Normal compressibility, respiratory phasicity and response to augmentation. Saphenofemoral Junction: No evidence of thrombus. Normal compressibility and flow on color Doppler imaging. Profunda Femoral Vein: No evidence of thrombus. Normal compressibility and flow on color Doppler imaging. Femoral Vein: No evidence of thrombus. Normal compressibility, respiratory phasicity and response to augmentation. Popliteal Vein: No evidence of thrombus. Normal compressibility, respiratory phasicity and response to augmentation. Calf Veins: No evidence of thrombus. Normal compressibility and flow on  color Doppler imaging. Superficial Great Saphenous Vein: No evidence of thrombus. Normal compressibility. Venous Reflux:  None. Other Findings: No evidence of superficial thrombophlebitis or abnormal fluid collection. IMPRESSION: No evidence of deep venous thrombosis in either lower extremity. Electronically Signed   By: Aletta Edouard M.D.   On: 10/09/2018 17:41   Dg Chest Port 1 View  Result Date: 10/09/2018 CLINICAL DATA:  Shortness of breath and chest pain, history of recent thoracentesis EXAM: PORTABLE CHEST 1 VIEW COMPARISON:  Film from earlier in the same day. FINDINGS: Cardiac shadow is again enlarged. Left-sided pleural effusion is again seen and slightly larger than that noted on the prior study. Dialysis catheter and right-sided chest wall port are again seen. No pneumothorax is noted. Mild vascular congestion is seen. IMPRESSION: Slight increase in left-sided pleural effusion. This may be positional in nature. Mild vascular congestion.  No pneumothorax is noted. Electronically Signed   By: Inez Catalina M.D.   On: 10/09/2018 19:45   Dg Chest Port 1 View  Result Date: 10/09/2018 CLINICAL DATA:  Status post right thoracentesis EXAM: PORTABLE CHEST 1 VIEW COMPARISON:  10/08/2018 FINDINGS: Cardiac shadow is stable. Dialysis catheter and right-sided chest wall port are again seen and stable. Postsurgical changes in the cervical spine are noted. Small left pleural effusion is noted. Right-sided pleural effusion has resolved following thoracentesis. No pneumothorax is noted. IMPRESSION: No pneumothorax following right thoracentesis. Small left pleural effusion. Electronically Signed   By: Inez Catalina M.D.   On: 10/09/2018 16:53   US Thoracentesis Asp Pleural Space W/img Guide  Result Date: 10/09/2018 INDICATION: History of end-stage renal disease, now with symptomatic pleural effusions. Please perform chest ultrasound and ultrasound-guided thoracentesis of the side with greater volume of fluid for  therapeutic and diagnostic purposes. EXAM: US THORACENTESIS ASP PLEURAL SPACE W/IMG GUIDE COMPARISON:  Chest radiograph-10/08/2018; CT abdomen pelvis-09/21/2018 MEDICATIONS: None. COMPLICATIONS: None immediate. TECHNIQUE: Informed written consent was obtained from the patient after a discussion of the risks, benefits and alternatives to treatment. A timeout was performed prior to the initiation of the procedure. Initial ultrasound scanning demonstrates small bilateral pleural effusions, right greater than left. As such the decision was made to proceed with ultrasound-guided right-sided thoracentesis. With the patient positioned left lateral decubitus, the posterolateral aspect of the right chest was prepped and draped in the usual sterile fashion. 1% lidocaine was used for local anesthesia. An ultrasound image was saved for documentation purposes. An 8 Fr Safe-T-Centesis catheter was introduced. The thoracentesis was performed. The catheter was removed and a dressing was applied. The patient tolerated the procedure well without immediate post procedural  complication. The patient was escorted to have an upright chest radiograph. FINDINGS: A total of approximately 700 cc liters of serous fluid was removed. Requested samples were sent to the laboratory. IMPRESSION: Successful ultrasound-guided right sided thoracentesis yielding 700 cc of pleural fluid. Electronically Signed   By: Sandi Mariscal M.D.   On: 10/09/2018 17:01     Medications:   . sodium chloride 500 mL (10/09/18 1819)  . ceFEPime (MAXIPIME) IV    . diltiazem (CARDIZEM) infusion 5 mg/hr (10/10/18 0834)  . vancomycin 750 mg (10/09/18 2257)   . Chlorhexidine Gluconate Cloth  6 each Topical Q0600  . Chlorhexidine Gluconate Cloth  6 each Topical Q0600  . DULoxetine  60 mg Oral Daily  . ferrous sulfate  325 mg Oral Daily  . heparin  5,000 Units Subcutaneous Q8H  . hydrALAZINE  25 mg Oral TID  . ipratropium-albuterol  3 mL Nebulization Q6H  .  isosorbide mononitrate  30 mg Oral Daily  . levothyroxine  25 mcg Oral QAC breakfast  . loratadine  10 mg Oral Daily  . magnesium oxide  400 mg Oral BID  . metoprolol succinate  50 mg Oral Daily  . sodium chloride flush  3 mL Intravenous Q12H  . vitamin B-12  1,000 mcg Oral Daily  . vitamin C  500 mg Oral Daily   sodium chloride, acetaminophen, cyclobenzaprine, hydrOXYzine, metoCLOPramide, metoprolol tartrate, ondansetron, oxyCODONE-acetaminophen **AND** oxyCODONE, polyethylene glycol, sodium chloride flush, temazepam  Assessment/ Plan:  63 y.o. female with history ofovarian cancer stage III,GERD, migraine headaches, cervical spinal stenosis, who was admitted for shortness of breath and fever.  [facility]  1.  ESRD on HD TTS.  Patient had dialysis session yesterday.  We will resume normal dialysis schedule today.  2.  Fever.  Patient was found to COVID 19-.  Also neutropenic.  Continue vancomycin and cefepime.  3.  Anemia of chronic kidney disease.  Hemoglobin currently 8.4.  Resume Epogen at this time..  4.  Secondary hyperparathyroidism.  Check serum phosphorus with dialysis session today.   LOS: 2 Inge Waldroup 7/25/202011:58 AM

## 2018-10-10 NOTE — Progress Notes (Signed)
Patient in sinus rhythm with heart rate in 70-80s.  Cardizem titrated and then d/c'd.  Patient resting at this time.

## 2018-10-10 NOTE — Progress Notes (Addendum)
Patient converted to AFIB while in HD. HR fluctuating 115-135. Other vitals otherwise unremarkable. MD Jannifer Franklin made aware. MD to place order for IV Cardizem 10 mg once. Will continue to monitor.   Update: Patient still Afib 105-120s despite receiving IV cardizem . MD paged.   Update: MD paged again. Awaiting response   Update: MD notified about patients HR still fluctuating 105-125. MD to place orders for another dose of cardizem 10 mg.   Update: HR 110-130s. Patient resting comfortably in bed at this time. MD paged.   Update: MD paged again.   Update: MD Marcille Blanco made aware of patient HR 110-130s. Per MD, restart Cardizem drip.   Jessica Burgess M

## 2018-10-10 NOTE — Progress Notes (Addendum)
Pt back into Afib during HD today (this also occurred during HD yesterday) -  HR to 140s. Pt reports uncomfortable feeling in chest /"heart fluttering". Ultrafiltration turned off and blood flow decreased during this event-- only able to remove 554mL today as a result yet completed full tx. HR improved to 110's after blood flow rate decreased and UF off. Dr. Holley Raring aware.    10/10/18 2100  Vital Signs  Temp 99.2 F (37.3 C)  Temp Source Oral  Pulse Rate (!) 113  Pulse Rate Source Monitor  Resp (!) 25  BP (!) 142/75  BP Location Right Arm  BP Method Automatic  Patient Position (if appropriate) Lying  Oxygen Therapy  SpO2 100 %  O2 Device Nasal Cannula  During Hemodialysis Assessment  KECN 48.4 KECN  Dialysis Fluid Bolus Normal Saline  Bolus Amount (mL) 250 mL  Intra-Hemodialysis Comments Tx completed;Tolerated well (753ml)

## 2018-10-11 DIAGNOSIS — A419 Sepsis, unspecified organism: Principal | ICD-10-CM

## 2018-10-11 DIAGNOSIS — R0602 Shortness of breath: Secondary | ICD-10-CM

## 2018-10-11 DIAGNOSIS — I272 Pulmonary hypertension, unspecified: Secondary | ICD-10-CM

## 2018-10-11 DIAGNOSIS — N186 End stage renal disease: Secondary | ICD-10-CM

## 2018-10-11 DIAGNOSIS — C569 Malignant neoplasm of unspecified ovary: Secondary | ICD-10-CM

## 2018-10-11 DIAGNOSIS — Z992 Dependence on renal dialysis: Secondary | ICD-10-CM

## 2018-10-11 DIAGNOSIS — J811 Chronic pulmonary edema: Secondary | ICD-10-CM

## 2018-10-11 LAB — CBC
HCT: 30.1 % — ABNORMAL LOW (ref 36.0–46.0)
Hemoglobin: 9.4 g/dL — ABNORMAL LOW (ref 12.0–15.0)
MCH: 32.8 pg (ref 26.0–34.0)
MCHC: 31.2 g/dL (ref 30.0–36.0)
MCV: 104.9 fL — ABNORMAL HIGH (ref 80.0–100.0)
Platelets: 122 K/uL — ABNORMAL LOW (ref 150–400)
RBC: 2.87 MIL/uL — ABNORMAL LOW (ref 3.87–5.11)
RDW: 15.3 % (ref 11.5–15.5)
WBC: 3.2 K/uL — ABNORMAL LOW (ref 4.0–10.5)
nRBC: 0 % (ref 0.0–0.2)

## 2018-10-11 LAB — BASIC METABOLIC PANEL WITH GFR
Anion gap: 10 (ref 5–15)
BUN: 17 mg/dL (ref 8–23)
CO2: 29 mmol/L (ref 22–32)
Calcium: 8.2 mg/dL — ABNORMAL LOW (ref 8.9–10.3)
Chloride: 98 mmol/L (ref 98–111)
Creatinine, Ser: 2.35 mg/dL — ABNORMAL HIGH (ref 0.44–1.00)
GFR calc Af Amer: 25 mL/min — ABNORMAL LOW (ref >60)
GFR calc non Af Amer: 21 mL/min — ABNORMAL LOW (ref >60)
Glucose, Bld: 115 mg/dL — ABNORMAL HIGH (ref 70–99)
Potassium: 3.9 mmol/L (ref 3.5–5.1)
Sodium: 137 mmol/L (ref 135–145)

## 2018-10-11 LAB — PROTEIN, BODY FLUID (OTHER): Total Protein, Body Fluid Other: 1.9 g/dL

## 2018-10-11 MED ORDER — AMIODARONE LOAD VIA INFUSION
150.0000 mg | Freq: Once | INTRAVENOUS | Status: AC
  Administered 2018-10-11: 150 mg via INTRAVENOUS
  Filled 2018-10-11: qty 83.34

## 2018-10-11 MED ORDER — AMIODARONE HCL IN DEXTROSE 360-4.14 MG/200ML-% IV SOLN
60.0000 mg/h | INTRAVENOUS | Status: DC
  Administered 2018-10-11 (×2): 60 mg/h via INTRAVENOUS
  Filled 2018-10-11: qty 200

## 2018-10-11 MED ORDER — APIXABAN 5 MG PO TABS
5.0000 mg | ORAL_TABLET | Freq: Two times a day (BID) | ORAL | Status: DC
  Administered 2018-10-11 – 2018-10-12 (×3): 5 mg via ORAL
  Filled 2018-10-11 (×3): qty 1

## 2018-10-11 MED ORDER — AMIODARONE HCL IN DEXTROSE 360-4.14 MG/200ML-% IV SOLN
30.0000 mg/h | INTRAVENOUS | Status: DC
  Administered 2018-10-12: 30 mg/h via INTRAVENOUS
  Filled 2018-10-11 (×2): qty 200

## 2018-10-11 MED ORDER — FUROSEMIDE 10 MG/ML IJ SOLN
40.0000 mg | Freq: Two times a day (BID) | INTRAMUSCULAR | Status: DC
  Administered 2018-10-11 – 2018-10-14 (×6): 40 mg via INTRAVENOUS
  Filled 2018-10-11 (×6): qty 4

## 2018-10-11 MED ORDER — DILTIAZEM HCL 25 MG/5ML IV SOLN
10.0000 mg | Freq: Once | INTRAVENOUS | Status: AC
  Administered 2018-10-11: 10 mg via INTRAVENOUS
  Filled 2018-10-11: qty 5

## 2018-10-11 MED ORDER — DILTIAZEM HCL 30 MG PO TABS
60.0000 mg | ORAL_TABLET | Freq: Four times a day (QID) | ORAL | Status: DC
  Administered 2018-10-11 – 2018-10-12 (×4): 60 mg via ORAL
  Filled 2018-10-11 (×4): qty 2

## 2018-10-11 NOTE — Progress Notes (Signed)
Patient ID: Jessica Burgess, female   DOB: [birth year], 63 y.o.   MRN: [id number]  [facility] PROGRESS NOTE  Jessica Burgess HQP:[id number] DOB: 14-Dec-1955 DOA: 10/08/2018 PCP: No primary care provider on file.  HPI/Subjective: Patient continues to be short of breath patient's heart rate was very high had to be on Cardizem drip   Objective: Vitals:   10/11/18 1243 10/11/18 1253  BP: (!) 130/100 (!) 130/100  Pulse: 99 87  Resp: 16 16  Temp: 97.9 F (36.6 C) 97.9 F (36.6 C)  SpO2: 99% 97%    Intake/Output Summary (Last 24 hours) at 10/11/2018 1348 Last data filed at 10/11/2018 5993 Gross per 24 hour  Intake 744.58 ml  Output 252 ml  Net 492.58 ml   Filed Weights   10/10/18 1730 10/10/18 2202 10/11/18 0414  Weight: 71 kg 71.8 kg 72.5 kg    ROS: Review of Systems  Constitutional: Positive for chills and fever.  Eyes: Negative for blurred vision.  Respiratory: Positive for shortness of breath. Negative for cough.   Cardiovascular: Negative for chest pain.  Gastrointestinal: Positive for abdominal pain. Negative for constipation, diarrhea, nausea and vomiting.  Genitourinary: Negative for dysuria.  Musculoskeletal: Negative for joint pain.  Neurological: Negative for dizziness and headaches.   Exam: Physical Exam  Constitutional: She is oriented to person, place, and time.  HENT:  Nose: No mucosal edema.  Mouth/Throat: No oropharyngeal exudate or posterior oropharyngeal edema.  Eyes: Pupils are equal, round, and reactive to light. Conjunctivae, EOM and lids are normal.  Neck: No JVD present. Carotid bruit is not present. No edema present. No thyroid mass and no thyromegaly present.  Cardiovascular: S1 normal and S2 normal. An irregularly irregular rhythm present. Exam reveals no gallop.  No murmur heard. Pulses:      Dorsalis pedis pulses are 2+ on the right side and 2+ on the left side.  Respiratory: No respiratory distress. She has decreased breath sounds in  the right lower field and the left lower field. She has no wheezes. She has no rhonchi. She has no rales.  GI: Soft. Bowel sounds are normal. She exhibits distension. There is abdominal tenderness.  Musculoskeletal:     Right ankle: She exhibits swelling.     Left ankle: She exhibits swelling.  Lymphadenopathy:    She has no cervical adenopathy.  Neurological: She is alert and oriented to person, place, and time. No cranial nerve deficit.  Skin: Skin is warm. No rash noted. Nails show no clubbing.  Psychiatric: She has a normal mood and affect.      Data Reviewed: Basic Metabolic Panel: Recent Labs  Lab 10/06/18 1313 10/08/18 0731 10/09/18 0546 10/10/18 0644 10/10/18 1750 10/11/18 0522  NA 138 130* 130* 132*  --  137  K 5.2* 5.0 5.8* 4.1  --  3.9  CL 97* 96* 93* 95*  --  98  CO2 32 24 23 28   --  29  GLUCOSE 95 162* 81 109*  --  115*  BUN 18 46* 58* 24*  --  17  CREATININE 2.59* 4.21* 4.85* 2.87*  --  2.35*  CALCIUM 8.3* 8.2* 8.2* 7.9*  --  8.2*  MG  --   --   --  2.3  --   --   PHOS  --   --   --   --  3.0  --    Liver Function Tests: Recent Labs  Lab 10/06/18 1313 10/08/18 0731  AST 26 33  ALT  26 24  ALKPHOS 91 95  BILITOT 0.8 0.9  PROT 5.8* 5.8*  ALBUMIN 3.0* 2.9*   No results for input(s): LIPASE, AMYLASE in the last 168 hours. No results for input(s): AMMONIA in the last 168 hours. CBC: Recent Labs  Lab 10/06/18 1312 10/08/18 0731 10/09/18 0546 10/10/18 0644 10/11/18 0522  WBC 4.7 3.0* 1.5* 1.4* 3.2*  NEUTROABS 3.4 2.6 1.0*  --   --   HGB 10.0* 10.0* 9.2* 8.4* 9.4*  HCT 32.5* 32.2* 29.6* 26.3* 30.1*  MCV 106.2* 103.2* 105.0* 102.3* 104.9*  PLT 100* 147* 125* 127* 122*   BNP (last 3 results) Recent Labs    08/28/18 0943 08/28/18 1443 10/08/18 0731  BNP >4,500.0* >4,500.0* 1,214.0*      Recent Results (from the past 240 hour(s))  SARS Coronavirus 2 (CEPHEID- Performed in [place] hospital lab), Hosp Order     Status: None   Collection  Time: 10/08/18  7:31 AM   Specimen: Nasopharyngeal Swab  Result Value Ref Range Status   SARS Coronavirus 2 NEGATIVE NEGATIVE Final    Comment: (NOTE) If result is NEGATIVE SARS-CoV-2 target nucleic acids are NOT DETECTED. The SARS-CoV-2 RNA is generally detectable in upper and lower  respiratory specimens during the acute phase of infection. The lowest  concentration of SARS-CoV-2 viral copies this assay can detect is 250  copies / mL. A negative result does not preclude SARS-CoV-2 infection  and should not be used as the sole basis for treatment or other  patient management decisions.  A negative result may occur with  improper specimen collection / handling, submission of specimen other  than nasopharyngeal swab, presence of viral mutation(s) within the  areas targeted by this assay, and inadequate number of viral copies  (<250 copies / mL). A negative result must be combined with clinical  observations, patient history, and epidemiological information. If result is POSITIVE SARS-CoV-2 target nucleic acids are DETECTED. The SARS-CoV-2 RNA is generally detectable in upper and lower  respiratory specimens dur ing the acute phase of infection.  Positive  results are indicative of active infection with SARS-CoV-2.  Clinical  correlation with patient history and other diagnostic information is  necessary to determine patient infection status.  Positive results do  not rule out bacterial infection or co-infection with other viruses. If result is PRESUMPTIVE POSTIVE SARS-CoV-2 nucleic acids MAY BE PRESENT.   A presumptive positive result was obtained on the submitted specimen  and confirmed on repeat testing.  While 2019 novel coronavirus  (SARS-CoV-2) nucleic acids may be present in the submitted sample  additional confirmatory testing may be necessary for epidemiological  and / or clinical management purposes  to differentiate between  SARS-CoV-2 and other Sarbecovirus currently known  to infect humans.  If clinically indicated additional testing with an alternate test  methodology [phone]) is advised. The SARS-CoV-2 RNA is generally  detectable in upper and lower respiratory sp ecimens during the acute  phase of infection. The expected result is Negative. Fact Sheet for Patients:  StrictlyIdeas.no Fact Sheet for Healthcare Providers: BankingDealers.co.za This test is not yet approved or cleared by the [place] FDA and has been authorized for detection and/or diagnosis of SARS-CoV-2 by FDA under an Emergency Use Authorization (EUA).  This EUA will remain in effect (meaning this test can be used) for the duration of the COVID-19 declaration under Section 564(b)(1) of the Act, 21 U.S.C. section 360bbb-3(b)(1), unless the authorization is terminated or revoked sooner. Performed at [facility], [phone] [place], [place] [address]   Blood Culture (routine x 2)     Status: None (Preliminary result)   Collection Time: 10/08/18  7:32 AM   Specimen: BLOOD  Result Value Ref Range Status   Specimen Description BLOOD RIGHT FA  Final   Special Requests   Final    BOTTLES DRAWN AEROBIC AND ANAEROBIC Blood Culture adequate volume   Culture   Final    NO GROWTH 3 DAYS Performed at [facility], [address], [place], [place] [address]    Report Status PENDING  Incomplete  Blood Culture (routine x 2)     Status: None (Preliminary result)   Collection Time: 10/08/18  7:32 AM   Specimen: BLOOD  Result Value Ref Range Status   Specimen Description BLOOD RIGHT HAND  Final   Special Requests   Final    BOTTLES DRAWN AEROBIC AND ANAEROBIC Blood Culture results may not be optimal due to an excessive volume of blood received in culture bottles   Culture   Final    NO GROWTH 3 DAYS Performed at [facility], [address] [place], [place] [address]    Report Status PENDING  Incomplete   Urine culture     Status: None   Collection Time: 10/08/18  8:35 AM   Specimen: Urine, Random  Result Value Ref Range Status   Specimen Description   Final    URINE, RANDOM Performed at [facility], [address], [place], [place] [address]    Special Requests   Final    NONE Performed at [facility], [address], [place], [place] [address]    Culture   Final    NO GROWTH Performed at [facility], [place] [address] [place], [place] [address]    Report Status 10/09/2018 FINAL  Final  C difficile quick scan w PCR reflex     Status: None   Collection Time: 10/09/18  4:10 AM   Specimen: STOOL  Result Value Ref Range Status   C Diff antigen NEGATIVE NEGATIVE Final   C Diff toxin NEGATIVE NEGATIVE Final   C Diff interpretation No C. difficile detected.  Final    Comment: Performed at [facility], [address] [place], [place] [address]  Body fluid culture     Status: None (Preliminary result)   Collection Time: 10/09/18  5:00 PM   Specimen: [facility] Cytology Pleural fluid  Result Value Ref Range Status   Specimen Description   Final    PLEURAL Performed at [facility], [address] [place], [place] [address]    Special Requests   Final    NONE Performed at [facility], [address] [place], [place] [address]    Gram Stain   Final    FEW WBC PRESENT,BOTH PMN AND MONONUCLEAR NO ORGANISMS SEEN    Culture   Final    NO GROWTH 2 DAYS Performed at [facility], [place] [address], [place], [place] [address]    Report Status PENDING  Incomplete     Studies: Dg Chest 2 View  Result Date: 10/10/2018 CLINICAL DATA:  Ovarian cancer on chemotherapy, hypertension, CHF, asthma, end-stage renal disease on dialysis, former smoker EXAM: CHEST - 2 VIEW COMPARISON:  10/09/2018 FINDINGS: RIGHT jugular Port-A-Cath with tip projecting over SVC. LEFT jugular dual-lumen central venous catheter with tip above cavoatrial  junction. Enlargement of cardiac silhouette with pulmonary vascular congestion. Stable mediastinal contours. Bibasilar effusions  and mild LEFT basilar atelectasis. Minimal perihilar edema. No pneumothorax or acute osseous findings. IMPRESSION: Enlargement of cardiac silhouette with pulmonary vascular congestion and mild pulmonary edema. Bibasilar effusions and LEFT LEFT basilar atelectasis. Electronically Signed   By: Lavonia Dana M.D.   On: 10/10/2018 17:28   US Abdomen Limited  Result Date: 10/09/2018 CLINICAL DATA:  History of end-stage renal disease, now with bilateral pleural effusions and intra-abdominal ascites. Please perform ascites search ultrasound and ultrasound-guided paracentesis as indicated. EXAM: LIMITED ABDOMEN ULTRASOUND FOR ASCITES TECHNIQUE: Limited ultrasound survey for ascites was performed in all four abdominal quadrants. COMPARISON:  CT abdomen pelvis-09/21/2018 FINDINGS: Sonographic evaluation demonstrates a trace amount of intra-abdominal ascites, too small to allow for safe ultrasound-guided paracentesis. Note is made of small bilateral pleural effusions, right greater than left. IMPRESSION: 1. Trace amount of intra-abdominal ascites, too small to allow for safe ultrasound-guided paracentesis. 2. Note is made of small bilateral pleural effusions, right greater than left. Patient subsequently underwent ultrasound-guided right-sided thoracentesis. Electronically Signed   By: Sandi Mariscal M.D.   On: 10/09/2018 16:59   US Venous Img Lower Bilateral  Result Date: 10/09/2018 CLINICAL DATA:  Bilateral lower extremity pain and edema. EXAM: BILATERAL LOWER EXTREMITY VENOUS DOPPLER ULTRASOUND TECHNIQUE: Gray-scale sonography with graded compression, as well as color Doppler and duplex ultrasound were performed to evaluate the lower extremity deep venous systems from the level of the common femoral vein and including the common femoral, femoral, profunda femoral, popliteal and calf veins  including the posterior tibial, peroneal and gastrocnemius veins when visible. The superficial great saphenous vein was also interrogated. Spectral Doppler was utilized to evaluate flow at rest and with distal augmentation maneuvers in the common femoral, femoral and popliteal veins. COMPARISON:  None. FINDINGS: RIGHT LOWER EXTREMITY Common Femoral Vein: No evidence of thrombus. Normal compressibility, respiratory phasicity and response to augmentation. Saphenofemoral Junction: No evidence of thrombus. Normal compressibility and flow on color Doppler imaging. Profunda Femoral Vein: No evidence of thrombus. Normal compressibility and flow on color Doppler imaging. Femoral Vein: No evidence of thrombus. Normal compressibility, respiratory phasicity and response to augmentation. Popliteal Vein: No evidence of thrombus. Normal compressibility, respiratory phasicity and response to augmentation. Calf Veins: No evidence of thrombus. Normal compressibility and flow on color Doppler imaging. Superficial Great Saphenous Vein: No evidence of thrombus. Normal compressibility. Venous Reflux:  None. Other Findings: No evidence of superficial thrombophlebitis or abnormal fluid collection. LEFT LOWER EXTREMITY Common Femoral Vein: No evidence of thrombus. Normal compressibility, respiratory phasicity and response to augmentation. Saphenofemoral Junction: No evidence of thrombus. Normal compressibility and flow on color Doppler imaging. Profunda Femoral Vein: No evidence of thrombus. Normal compressibility and flow on color Doppler imaging. Femoral Vein: No evidence of thrombus. Normal compressibility, respiratory phasicity and response to augmentation. Popliteal Vein: No evidence of thrombus. Normal compressibility, respiratory phasicity and response to augmentation. Calf Veins: No evidence of thrombus. Normal compressibility and flow on color Doppler imaging. Superficial Great Saphenous Vein: No evidence of thrombus. Normal  compressibility. Venous Reflux:  None. Other Findings: No evidence of superficial thrombophlebitis or abnormal fluid collection. IMPRESSION: No evidence of deep venous thrombosis in either lower extremity. Electronically Signed   By: Aletta Edouard M.D.   On: 10/09/2018 17:41   Dg Chest Port 1 View  Result Date: 10/09/2018 CLINICAL DATA:  Shortness of breath and chest pain, history of recent thoracentesis EXAM: PORTABLE CHEST 1 VIEW COMPARISON:  Film from earlier in the same day. FINDINGS: Cardiac shadow is again enlarged. Left-sided  pleural effusion is again seen and slightly larger than that noted on the prior study. Dialysis catheter and right-sided chest wall port are again seen. No pneumothorax is noted. Mild vascular congestion is seen. IMPRESSION: Slight increase in left-sided pleural effusion. This may be positional in nature. Mild vascular congestion.  No pneumothorax is noted. Electronically Signed   By: Inez Catalina M.D.   On: 10/09/2018 19:45   Dg Chest Port 1 View  Result Date: 10/09/2018 CLINICAL DATA:  Status post right thoracentesis EXAM: PORTABLE CHEST 1 VIEW COMPARISON:  10/08/2018 FINDINGS: Cardiac shadow is stable. Dialysis catheter and right-sided chest wall port are again seen and stable. Postsurgical changes in the cervical spine are noted. Small left pleural effusion is noted. Right-sided pleural effusion has resolved following thoracentesis. No pneumothorax is noted. IMPRESSION: No pneumothorax following right thoracentesis. Small left pleural effusion. Electronically Signed   By: Inez Catalina M.D.   On: 10/09/2018 16:53   US Thoracentesis Asp Pleural Space W/img Guide  Result Date: 10/09/2018 INDICATION: History of end-stage renal disease, now with symptomatic pleural effusions. Please perform chest ultrasound and ultrasound-guided thoracentesis of the side with greater volume of fluid for therapeutic and diagnostic purposes. EXAM: US THORACENTESIS ASP PLEURAL SPACE W/IMG GUIDE  COMPARISON:  Chest radiograph-10/08/2018; CT abdomen pelvis-09/21/2018 MEDICATIONS: None. COMPLICATIONS: None immediate. TECHNIQUE: Informed written consent was obtained from the patient after a discussion of the risks, benefits and alternatives to treatment. A timeout was performed prior to the initiation of the procedure. Initial ultrasound scanning demonstrates small bilateral pleural effusions, right greater than left. As such the decision was made to proceed with ultrasound-guided right-sided thoracentesis. With the patient positioned left lateral decubitus, the posterolateral aspect of the right chest was prepped and draped in the usual sterile fashion. 1% lidocaine was used for local anesthesia. An ultrasound image was saved for documentation purposes. An 8 Fr Safe-T-Centesis catheter was introduced. The thoracentesis was performed. The catheter was removed and a dressing was applied. The patient tolerated the procedure well without immediate post procedural complication. The patient was escorted to have an upright chest radiograph. FINDINGS: A total of approximately 700 cc liters of serous fluid was removed. Requested samples were sent to the laboratory. IMPRESSION: Successful ultrasound-guided right sided thoracentesis yielding 700 cc of pleural fluid. Electronically Signed   By: Sandi Mariscal M.D.   On: 10/09/2018 17:01    Scheduled Meds: . apixaban  5 mg Oral BID  . Chlorhexidine Gluconate Cloth  6 each Topical Q0600  . Chlorhexidine Gluconate Cloth  6 each Topical Q0600  . diltiazem  60 mg Oral Q6H  . DULoxetine  60 mg Oral Daily  . [START ON 10/13/2018] epoetin (EPOGEN/PROCRIT) injection  10,000 Units Intravenous Q T,Th,Sa-HD  . ferrous sulfate  325 mg Oral Daily  . furosemide  40 mg Intravenous Q12H  . hydrALAZINE  25 mg Oral TID  . ipratropium-albuterol  3 mL Nebulization Q6H  . isosorbide mononitrate  30 mg Oral Daily  . levothyroxine  25 mcg Oral QAC breakfast  . loratadine  10 mg Oral  Daily  . magnesium oxide  400 mg Oral BID  . metoprolol succinate  50 mg Oral Daily  . sodium chloride flush  3 mL Intravenous Q12H  . vitamin B-12  1,000 mcg Oral Daily  . vitamin C  500 mg Oral Daily   Continuous Infusions: . sodium chloride 250 mL (10/10/18 2239)  . amiodarone 60 mg/hr (10/11/18 1307)   Followed by  . amiodarone    .  ceFEPime (MAXIPIME) IV 2 g (10/10/18 1159)  . vancomycin 750 mg (10/10/18 2245)    Assessment/Plan:  1. Clinical sepsis and neutropenic fever.  Continue vancomycin and Maxipime cultures are negative  2. Pleural effusion status post thoracentesis LDH is elevated possible malignant pleural effusion fluid cytology pending 3. acute on chronic systolic congestive heart failure with shortness of breath.  Currently receiving dialysis I will add IV Lasix 4. A. fib with RVR has been seen by cardiology started on amiodarone continue oral Cardizem also started on Eliquis 5. Ovarian cancer undergoing chemotherapy.  Last chemo on Tuesday.  Pancytopenia secondary to chemotherapy.  Patient listed as a full code. 6. End-stage renal disease on dialysis 7. Leg pain we will get an ultrasound of the lower extremity 8. Hypertension on metoprolol and hydralazine 9. Hypothyroidism unspecified on levothyroxine  Code Status:     Code Status Orders  (From admission, onward)         Start     Ordered   10/08/18 1047  Full code  Continuous     10/08/18 1049        Code Status History    Date Active Date Inactive Code Status Order ID Comments User Context   08/28/2018 1952 08/30/2018 1714 Full Code [id number]  Gorden Harms, MD Inpatient   08/06/2018 2355 08/18/2018 1650 Full Code [id number]  Mayer Camel, NP ED   Advance Care Planning Activity    Advance Directive Documentation     Most Recent Value  Type of Advance Directive  Healthcare Power of Attorney, Living will  Pre-existing out of facility DNR order (yellow form or pink MOST form)  -  "MOST" Form in  Place?  -     Family Communication: Spoke with sister on the phone Disposition Plan: To be determined  Antibiotics:  Vancomycin  Cefepime  Time spent: 28 minutes  [place]

## 2018-10-11 NOTE — Progress Notes (Signed)
Cardiology Consultation:   Patient ID: Jessica Burgess MRN: [id number]; DOB: [birth year]  Admit date: 10/08/2018 Date of Consult: 10/11/2018  Primary Care Provider: Steele Sizer, MD Primary Cardiologist: End Physician requesting consult: Dr. Serita Grit Reason for consult: Tach dismal atrial fibrillation   Patient Profile:   [name] is a 63 year old woman with stage IIIc high-grade serous ovarian carcinoma, end-stage renal disease on hemodialysis, severe pulmonary hypertension, on chemotherapy single agent gemcitabine, chronic shortness of breath, COPD former smoker 20 years/30-pack-year presenting to the emergency room with shortness of breath  History of Present Illness:   Presented to the emergency room October 08, 2018, worsening shortness of breath, leg edema, tachypnea, hypoxia saturations 87% on 3 L nasal cannula.  Symptoms have been coming on for a while but worse day prior to presentation.  She does have home oxygen at 2 L but increasing up to 3-4 last 24 hours.  She has dialysis Tuesday Thursday Saturday, reports that she still makes urine.  Emergency room notes indicating also reports having fever, intermittent sometimes associated with dialysis Coronal negative x3  Hypertensive on arrival systolic pressure 761, temperature 102.5 In the emergency room started on vancomycin and cefepime Etiology of her fever unclear UA negative, chest x-ray with mild congestion Blood cultures pending Concern for neutropenic fever.  WBC 1.5, ANC of 1 Pancytopenia Last chemotherapy October 06, 2018 During the day yesterday with shaking chills and fever,  fever up to 103  Ultrasound with moderate right pleural effusion, Thoracentesis ordered, 700 cc out 2 days ago At that time noted to be in atrial fibrillation heart rate up to 160 bpm Diltiazem infusion started, heart rate up to 140 She developed acute onset shortness of breath Heart rate continue to run fast 120 up to 130, digoxin IV  given  Nursing note indicates sinus rhythm yesterday morning Cardizem titrated down and discontinued Less short of breath.  Plan for dialysis yesterday Continued to have fevers, covered with vancomycin and Maxipime  Back into atrial fibrillation yesterday during hemodialysis, heart rate up to 140, restarted on Cardizem push and started on Cardizem infusion overnight  This morning remains in atrial fibrillation rate in the low 100s  Other past medical history reviewed hospitalization in late May-early June.  She presented to [facility] with worsening shortness of breath, cough, and edema.  Echo showed LVEF 40-45% with concern for anterior/anteroseptal hypokinesis.  Catheterization was not pursued due to acute on chronic renal failure as well as thrombocytopenia and transfusion dependent anemia.   Heart Pathway Score:     Past Medical History:  Diagnosis Date  . Allergic rhinitis, cause unspecified   . Anxiety state, unspecified   . Arthritis   . Asthma    only when sick   . Backache, unspecified   . Bronchitis    hx of when get sick  . Cancer ([place])    skin cancer , basal cell   . Cancer ([place]) 11/2016   ovarian  . Cervicalgia   . CHF (congestive heart failure) ([place])   . Complication of anesthesia   . Dermatophytosis of nail   . Dysmetabolic syndrome X   . Encounter for long-term (current) use of other medications   . Esophageal reflux   . Hypertension   . Insomnia, unspecified   . Leukocytosis, unspecified   . Migraine without aura, without mention of intractable migraine without mention of status migrainosus   . Other and unspecified hyperlipidemia   . Other malaise and fatigue   . Overweight(278.02)   .  Personal history of chemotherapy now   ovarian  . PONV (postoperative nausea and vomiting)   . Renal insufficiency   . Spinal stenosis in cervical region   . Symptomatic menopausal or female climacteric states   . Unspecified disorder of skin and subcutaneous tissue   .  Unspecified vitamin D deficiency     Past Surgical History:  Procedure Laterality Date  . ABDOMINAL HYSTERECTOMY    . ANTERIOR CERVICAL DECOMP/DISCECTOMY FUSION N/A 06/07/2015   Procedure: Cervical three - four and Cervical six- seven anterior cervical decompression with fusion interbody prosthesis plating and bonegraft;  Surgeon: Newman Pies, MD;  Location: [place] [facility];  Service: Neurosurgery;  Laterality: N/A;  C34 and C67 anterior cervical decompression with fusion interbody prosthesis plating and bonegraft  . BACK SURGERY     x2 Lower   . DIALYSIS/PERMA CATHETER INSERTION N/A 08/17/2018   Procedure: DIALYSIS/PERMA CATHETER INSERTION;  Surgeon: Algernon Huxley, MD;  Location: [place] [facility];  Service: Cardiovascular;  Laterality: N/A;  . EVACUATION OF CERVICAL HEMATOMA N/A 06/14/2015   Procedure: EVACUATION OF CERVICAL HEMATOMA;  Surgeon: Newman Pies, MD;  Location: [place] [facility];  Service: Neurosurgery;  Laterality: N/A;  . NECK SURGERY     x3  . TONSILLECTOMY       Home Medications:  Prior to Admission medications   Medication Sig Start Date End Date Taking? Authorizing Provider  albuterol (PROVENTIL HFA;VENTOLIN HFA) 108 (90 Base) MCG/ACT inhaler Inhale 2 puffs into the lungs every 6 (six) hours as needed for wheezing or shortness of breath. 10/04/16  Yes Hubbard Hartshorn, FNP  albuterol (PROVENTIL) (2.5 MG/3ML) 0.083% nebulizer solution Take 3 mLs (2.5 mg total) by nebulization every 6 (six) hours as needed for wheezing or shortness of breath. 08/18/18  Yes Sowles, Drue Stager, MD  cetirizine (ZYRTEC) 10 MG tablet Take 10 mg by mouth daily.   Yes [provider]  Cholecalciferol (VITAMIN D-1000 MAX ST) 1000 UNITS tablet Take 1,000 Units by mouth 2 (two) times daily.    Yes [provider]  cyclobenzaprine (FLEXERIL) 10 MG tablet Take one tablet every 8 hours as needed Patient taking differently: Take 10 mg by mouth every 8 (eight) hours as needed for muscle spasms.   08/03/18  Yes Lloyd Huger, MD  DULoxetine (CYMBALTA) 60 MG capsule Take 1 capsule (60 mg total) by mouth daily. 09/16/18  Yes Sowles, Drue Stager, MD  ferrous sulfate (IRON SUPPLEMENT) 325 (65 FE) MG tablet Take 1 tablet by mouth daily.   Yes [provider]  furosemide (LASIX) 80 MG tablet Take 1 tablet (80 mg total) by mouth 2 (two) times daily. 08/28/18 11/26/18 Yes End, Harrell Gave, MD  hydrALAZINE (APRESOLINE) 25 MG tablet Take 1 tablet (25 mg total) by mouth 3 (three) times daily. For BP if above 150/90 09/28/18  Yes Sowles, Drue Stager, MD  hydrOXYzine (ATARAX/VISTARIL) 10 MG tablet Take 1 tablet (10 mg total) by mouth every 6 (six) hours as needed. Patient taking differently: Take 10 mg by mouth every 6 (six) hours as needed for itching or anxiety.  09/16/18  Yes Sowles, Drue Stager, MD  ipratropium (ATROVENT HFA) 17 MCG/ACT inhaler Inhale 2 puffs into the lungs every 6 (six) hours as needed for wheezing.    Yes [provider]  isosorbide mononitrate (IMDUR) 30 MG 24 hr tablet Take 1 tablet (30 mg total) by mouth daily. 08/18/18  Yes Gladstone Lighter, MD  levothyroxine (SYNTHROID) 25 MCG tablet Take 1 tablet (25 mcg total) by mouth daily  before breakfast. 07/21/18  Yes Borders, Kirt Boys, NP  lidocaine (XYLOCAINE) 2 % solution Use as directed 15 mLs in the mouth or throat as needed for mouth pain. 11/28/17  Yes Burns, Wandra Feinstein, NP  Lysine 1000 MG TABS Take 1,000 mg by mouth daily.    Yes [provider]  Magnesium Oxide 400 (240 Mg) MG TABS Take 400 mg by mouth 2 (two) times daily.    Yes [provider]  metoCLOPramide (REGLAN) 5 MG tablet Take 1 tablet (5 mg total) by mouth every 8 (eight) hours as needed for nausea or vomiting. 09/21/18 09/21/19 Yes Hinda Kehr, MD  metoprolol succinate (TOPROL-XL) 50 MG 24 hr tablet Take 1 tablet (50 mg total) by mouth daily. Take with or immediately following a meal. 08/18/18  Yes Gladstone Lighter, MD  mometasone-formoterol (DULERA)  200-5 MCG/ACT AERO Inhale 2 puffs into the lungs 2 (two) times daily.   Yes [provider]  Multiple Vitamins-Minerals (MULTIVITAMIN PO) Take 1 tablet by mouth daily.    Yes [provider]  Omega-3 Fatty Acids (FISH OIL) 1000 MG CAPS Take 1,000 capsules by mouth 2 (two) times daily.    Yes [provider]  ondansetron (ZOFRAN-ODT) 8 MG disintegrating tablet Take 1 tablet (8 mg total) by mouth every 8 (eight) hours as needed for nausea or vomiting. 07/20/18  Yes Borders, Kirt Boys, NP  oxyCODONE-acetaminophen (PERCOCET) 10-325 MG tablet Take 1 tablet by mouth every 6 (six) hours as needed for pain. 10/06/18  Yes Lloyd Huger, MD  polyethylene glycol powder (GLYCOLAX/MIRALAX) 17 GM/SCOOP powder Take 17 g by mouth daily as needed for mild constipation or moderate constipation.    Yes [provider]  SUMAtriptan (IMITREX) 100 MG tablet May repeat in 2 hours if headache persists or recurs. Patient taking differently: Take 100 mg by mouth daily as needed for migraine. May repeat in 2 hours if headache persists or recurs. 09/16/18  Yes Sowles, Drue Stager, MD  temazepam (RESTORIL) 30 MG capsule Take 1 capsule (30 mg total) by mouth at bedtime as needed for sleep. 09/16/18  Yes Sowles, Drue Stager, MD  vitamin B-12 (CYANOCOBALAMIN) 1000 MCG tablet Take 1,000 mcg by mouth daily.   Yes [provider]  vitamin C (ASCORBIC ACID) 500 MG tablet Take 500 mg by mouth daily.   Yes [provider]  fluticasone (FLONASE) 50 MCG/ACT nasal spray Place 2 sprays into both nostrils as needed. 06/11/18 09/17/18  Steele Sizer, MD  prochlorperazine (COMPAZINE) 10 MG tablet Take 1 tablet (10 mg total) by mouth every 6 (six) hours as needed (Nausea or vomiting). 12/02/16 08/08/17  Lloyd Huger, MD    Inpatient Medications: Scheduled Meds: . Chlorhexidine Gluconate Cloth  6 each Topical Q0600  . Chlorhexidine Gluconate Cloth  6 each Topical Q0600  . DULoxetine  60 mg Oral  Daily  . [START ON 10/13/2018] epoetin (EPOGEN/PROCRIT) injection  10,000 Units Intravenous Q T,Th,Sa-HD  . ferrous sulfate  325 mg Oral Daily  . furosemide  40 mg Intravenous Q12H  . heparin  5,000 Units Subcutaneous Q8H  . hydrALAZINE  25 mg Oral TID  . ipratropium-albuterol  3 mL Nebulization Q6H  . isosorbide mononitrate  30 mg Oral Daily  . levothyroxine  25 mcg Oral QAC breakfast  . loratadine  10 mg Oral Daily  . magnesium oxide  400 mg Oral BID  . metoprolol succinate  50 mg Oral Daily  . sodium chloride flush  3 mL Intravenous Q12H  .  vitamin B-12  1,000 mcg Oral Daily  . vitamin C  500 mg Oral Daily   Continuous Infusions: . sodium chloride 250 mL (10/10/18 2239)  . ceFEPime (MAXIPIME) IV 2 g (10/10/18 1159)  . diltiazem (CARDIZEM) infusion 7.5 mg/hr (10/11/18 0618)  . vancomycin 750 mg (10/10/18 2245)   PRN Meds: sodium chloride, acetaminophen, cyclobenzaprine, hydrOXYzine, LORazepam, metoCLOPramide, metoprolol tartrate, ondansetron, oxyCODONE-acetaminophen **AND** oxyCODONE, polyethylene glycol, sodium chloride flush, sodium chloride flush, temazepam  Allergies:   No Known Allergies  Social History:   Social History   Socioeconomic History  . Marital status: Single    Spouse name: Not on file  . Number of children: 0  . Years of education: some college  . Highest education level: 12th grade  Occupational History    Employer: DISABLED  . Occupation: Disabled   Social Needs  . Financial resource strain: Somewhat hard  . Food insecurity    Worry: Never true    Inability: Never true  . Transportation needs    Medical: No    Non-medical: No  Tobacco Use  . Smoking status: Former Smoker    Packs/day: 1.50    Years: 20.00    Pack years: 30.00    Types: Cigarettes    Start date: 03/19/1979    Quit date: 08/25/1999    Years since quitting: 19.1  . Smokeless tobacco: Never Used  . Tobacco comment: smoking cessation materials not required  Substance and Sexual  Activity  . Alcohol use: Not Currently    Alcohol/week: 0.0 standard drinks  . Drug use: No  . Sexual activity: Never  Lifestyle  . Physical activity    Days per week: 0 days    Minutes per session: 0 min  . Stress: Very much  Relationships  . Social Herbalist on phone: Patient refused    Gets together: Patient refused    Attends religious service: Patient refused    Active member of club or organization: Patient refused    Attends meetings of clubs or organizations: Patient refused    Relationship status: Patient refused  . Intimate partner violence    Fear of current or ex partner: No    Emotionally abused: No    Physically abused: No    Forced sexual activity: No  Other Topics Concern  . Not on file  Social History Narrative   Patient is single.    Patient lives with roommates.    Patient on disability    Patient has no children.    Patient has some college     Family History:    Family History  Problem Relation Age of Onset  . Depression Mother   . Migraines Mother   . Dementia Father   . Diabetes Father   . Hyperlipidemia Father   . Hyperlipidemia Brother   . Hyperlipidemia Brother   . Breast cancer Paternal Aunt        45s     ROS:  Please see the history of present illness.  Review of Systems  Constitutional: Negative.   HENT: Negative.   Respiratory: Positive for shortness of breath.   Cardiovascular: Positive for leg swelling.  Gastrointestinal: Negative.   Musculoskeletal: Negative.   Neurological: Negative.   Psychiatric/Behavioral: Negative.   All other systems reviewed and are negative.    Physical Exam/Data:   Vitals:   10/11/18 0801 10/11/18 0830 10/11/18 0900 10/11/18 0930  BP: (!) 143/85 (!) 132/96 (!) 130/104 128/85  Pulse: (!) 103  67 (!) 150  Resp:      Temp:      TempSrc:      SpO2: 94%     Weight:      Height:        Intake/Output Summary (Last 24 hours) at 10/11/2018 1024 Last data filed at 10/11/2018 6578  Gross per 24 hour  Intake 744.58 ml  Output 252 ml  Net 492.58 ml   Last 3 Weights 10/11/2018 10/10/2018 10/10/2018  Weight (lbs) 159 lb 12.8 oz 158 lb 6.4 oz 156 lb 8.4 oz  Weight (kg) 72.485 kg 71.85 kg 71 kg     Body mass index is 29.23 kg/m.  General:  Well nourished, well developed, in no acute distress HEENT: normal Lymph: no adenopathy Neck: no JVD Endocrine:  No thryomegaly Vascular: No carotid bruits; FA pulses 2+ bilaterally without bruits  Cardiac: Irregularly irregular RRR; no murmur  2+ pitting lower extremity edema to below the knees Lungs:  clear to auscultation bilaterally, no wheezing, rhonchi or rales  Abd: soft, nontender, no hepatomegaly  Ext: no edema Musculoskeletal:  No deformities, BUE and BLE strength normal and equal Skin: warm and dry  Neuro:  CNs 2-12 intact, no focal abnormalities noted Psych:  Normal affect   EKG:  The EKG was personally reviewed and demonstrates:   EKG October 08, 2018 normal sinus rhythm rate 96 bpm no significant ST-T wave changes EKG dated October 09, 2018 atrial fibrillation rate 137 bpm no significant ST-T wave changes Telemetry:  Telemetry was personally reviewed and demonstrates: Atrial fibrillation  Relevant CV Studies: Echocardiogram August 28, 2018  1. The left ventricle has low normal systolic function, with an ejection fraction of 50-55%. The cavity size was normal. There is mildly increased left ventricular wall thickness.  2. The right ventricle has normal systolc function. The cavity was normal. There is no increase in right ventricular wall thickness. Right ventricular systolic pressure is severely elevated with an estimated pressure of 73.7 mmHg.  3. Trivial pericardial effusion is present.  4. Mitral valve regurgitation is moderate  5. Tricuspid valve regurgitation is moderate.  6. Aortic valve regurgitation is mild to moderate by color flow Doppler.  Laboratory Data:  High Sensitivity Troponin:   Recent Labs  Lab  10/08/18 0731 10/09/18 1928  TROPONINIHS 37* 60*     Cardiac EnzymesNo results for input(s): TROPONINI in the last 168 hours. No results for input(s): TROPIPOC in the last 168 hours.  Chemistry Recent Labs  Lab 10/09/18 0546 10/10/18 0644 10/11/18 0522  NA 130* 132* 137  K 5.8* 4.1 3.9  CL 93* 95* 98  CO2 23 28 29   GLUCOSE 81 109* 115*  BUN 58* 24* 17  CREATININE 4.85* 2.87* 2.35*  CALCIUM 8.2* 7.9* 8.2*  GFRNONAA 9* 17* 21*  GFRAA 10* 19* 25*  ANIONGAP 14 9 10     Recent Labs  Lab 10/06/18 1313 10/08/18 0731  PROT 5.8* 5.8*  ALBUMIN 3.0* 2.9*  AST 26 33  ALT 26 24  ALKPHOS 91 95  BILITOT 0.8 0.9   Hematology Recent Labs  Lab 10/09/18 0546 10/10/18 0644 10/11/18 0522  WBC 1.5* 1.4* 3.2*  RBC 2.82* 2.57* 2.87*  HGB 9.2* 8.4* 9.4*  HCT 29.6* 26.3* 30.1*  MCV 105.0* 102.3* 104.9*  MCH 32.6 32.7 32.8  MCHC 31.1 31.9 31.2  RDW 15.6* 15.3 15.3  PLT 125* 127* 122*   BNP Recent Labs  Lab 10/08/18 0731  BNP 1,214.0*    DDimer No results  for input(s): DDIMER in the last 168 hours.   Radiology/Studies:  Dg Chest 2 View  Result Date: 10/10/2018  IMPRESSION: Enlargement of cardiac silhouette with pulmonary vascular congestion and mild pulmonary edema. Bibasilar effusions and LEFT LEFT basilar atelectasis. Electronically Signed   By: Lavonia Dana M.D.   On: 10/10/2018 17:28   US Abdomen Limited  Result Date: 10/09/2018  IMPRESSION: 1. Trace amount of intra-abdominal ascites, too small to allow for safe ultrasound-guided paracentesis. 2. Note is made of small bilateral pleural effusions, right greater than left. Patient subsequently underwent ultrasound-guided right-sided thoracentesis. Electronically Signed   By: Sandi Mariscal M.D.   On: 10/09/2018 16:59   US Venous Img Lower Bilateral  Result Date: 10/09/2018  IMPRESSION: No evidence of deep venous thrombosis in either lower extremity. Electronically Signed   By: Aletta Edouard M.D.   On: 10/09/2018 17:41   Dg  Chest Port 1 View  Result Date: 10/09/2018  IMPRESSION: Slight increase in left-sided pleural effusion. This may be positional in nature. Mild vascular congestion.  No pneumothorax is noted. Electronically Signed   By: Inez Catalina M.D.   On: 10/09/2018 19:45   Dg Chest Port 1 View  Result Date: 10/09/2018  IMPRESSION: No pneumothorax following right thoracentesis. Small left pleural effusion. Electronically Signed   By: Inez Catalina M.D.   On: 10/09/2018 16:53   Dg Chest Port 1 View  Result Date: 10/08/2018 . IMPRESSION: 1. Mild congestive heart failure, similar to prior study. Electronically Signed   By: Titus Dubin M.D.   On: 10/08/2018 08:07   US Thoracentesis Asp Pleural Space W/img Guide  Result Date: 10/09/2018 . IMPRESSION: Successful ultrasound-guided right sided thoracentesis yielding 700 cc of pleural fluid. Electronically Signed   By: Sandi Mariscal M.D.   On: 10/09/2018 17:01    Assessment and Plan:   1. Paroxysmal atrial fibrillation Likely secondary to fluid overload, neutropenic fevers -On further discussion with her she feels paroxysmal atrial fibrillation dates back months to years with similar symptoms of acute chest tightness shortness of breath -Presenting to the hospital normal sinus rhythm, shortly developed atrial fibrillation with RVR converting to normal sinus rhythm on diltiazem infusion -Recurrent atrial fibrillation in the setting of hemodialysis yesterday, now on diltiazem infusion, still atrial fibrillation CHADS VASC: 4 (female, CHF, hypertension, PAD) -Currently not on anticoagulation.   Case discussed with oncology/hematology and nephrology They feel she is acceptable risk for Eliquis We will start Eliquis 5 twice daily.  Confirmed with nephrology --We will start amiodarone infusion in effort to restore normal sinus rhythm Change diltiazem infusion to oral dosing   2.  Acute respiratory distress with hypoxia Severe pulmonary hypertension, on  hemodialysis --Would recommend aggressive hemodialysis for fluid removal Pleural effusion likely secondary to fluid overload --- Could consider changing Lasix to torsemide on nondialysis days  3.   Neutropenic fevers Work-up pending, on broad-spectrum antibiotics Followed by hematology oncology Pancytopenia felt secondary to recent chemotherapy On Epogen  4.  Ovarian cancer Undergoing chemotherapy, stage III Pancytopenia  5.  Pulmonary hypertension Echocardiogram in June with severely elevated right heart pressures Likely contributing to pleural effusion -Needs aggressive fluid removal Etiology of pulmonary hypertension likely multifactorial, she does have 30-pack-year smoking history,  She is also high risk of PE in the setting of cancer --Starting Eliquis given atrial fibrillation as above --Consider torsemide on nondialysis days and more aggressive dry weight  Case discussed with hematology/oncology, nephrology, hospitalist service, discussed risk and benefit of anticoagulation with her  in detail and with her team  Total encounter time more than 110 minutes  Greater than 50% was spent in counseling and coordination of care with the patient    For questions or updates, please contact [place] Please consult [email or web address] for contact info under     Signed, Ida Rogue, MD  10/11/2018 10:24 AM

## 2018-10-11 NOTE — Progress Notes (Signed)
[facility]  ROUNDING NOTE   Subjective:  Patient seen at bedside. Underwent dialysis yesterday. Still has considerable lower extremity edema..    Objective:  Vital signs in last 24 hours:  Temp:  [97.8 F (36.6 C)-99.2 F (37.3 C)] 97.9 F (36.6 C) (07/26 1253) Pulse Rate:  [66-150] 87 (07/26 1253) Resp:  [16-30] 16 (07/26 1253) BP: (126-173)/(62-118) 130/100 (07/26 1253) SpO2:  [66 %-100 %] 97 % (07/26 1253) Weight:  [71 kg-72.5 kg] 72.5 kg (07/26 0414)  Weight change: 0 kg Filed Weights   10/10/18 1730 10/10/18 2202 10/11/18 0414  Weight: 71 kg 71.8 kg 72.5 kg    Intake/Output: I/O last 3 completed shifts: In: 744.6 [P.O.:480; I.V.:14.6; IV Piggyback:250] Out: 252 [Urine:1; Other:251]   Intake/Output this shift:  No intake/output data recorded.  Physical Exam: General: No acute distress  Head: Normocephalic, atraumatic. Moist oral mucosal membranes  Eyes: Anicteric  Neck: Supple, trachea midline  Lungs:  Bilateral rhonchi and rales, normal effort  Heart: S1S2 no rubs  Abdomen:  Soft, nontender, bowel sounds present  Extremities: 3+ peripheral edema.  Neurologic: Awake, alert, following commands  Skin: No lesions  Access:  IJ PermCath    Basic Metabolic Panel: Recent Labs  Lab 10/06/18 1313 10/08/18 0731 10/09/18 0546 10/10/18 0644 10/10/18 1750 10/11/18 0522  NA 138 130* 130* 132*  --  137  K 5.2* 5.0 5.8* 4.1  --  3.9  CL 97* 96* 93* 95*  --  98  CO2 32 24 23 28   --  29  GLUCOSE 95 162* 81 109*  --  115*  BUN 18 46* 58* 24*  --  17  CREATININE 2.59* 4.21* 4.85* 2.87*  --  2.35*  CALCIUM 8.3* 8.2* 8.2* 7.9*  --  8.2*  MG  --   --   --  2.3  --   --   PHOS  --   --   --   --  3.0  --     Liver Function Tests: Recent Labs  Lab 10/06/18 1313 10/08/18 0731  AST 26 33  ALT 26 24  ALKPHOS 91 95  BILITOT 0.8 0.9  PROT 5.8* 5.8*  ALBUMIN 3.0* 2.9*   No results for input(s): LIPASE, AMYLASE in the last 168 hours. No results  for input(s): AMMONIA in the last 168 hours.  CBC: Recent Labs  Lab 10/06/18 1312 10/08/18 0731 10/09/18 0546 10/10/18 0644 10/11/18 0522  WBC 4.7 3.0* 1.5* 1.4* 3.2*  NEUTROABS 3.4 2.6 1.0*  --   --   HGB 10.0* 10.0* 9.2* 8.4* 9.4*  HCT 32.5* 32.2* 29.6* 26.3* 30.1*  MCV 106.2* 103.2* 105.0* 102.3* 104.9*  PLT 100* 147* 125* 127* 122*    Cardiac Enzymes: No results for input(s): CKTOTAL, CKMB, CKMBINDEX, TROPONINI in the last 168 hours.  BNP: Invalid input(s): POCBNP  CBG: No results for input(s): GLUCAP in the last 168 hours.  Microbiology: Results for orders placed or performed during the hospital encounter of 10/08/18  SARS Coronavirus 2 (CEPHEID- Performed in [facility] hospital lab), Hosp Order     Status: None   Collection Time: 10/08/18  7:31 AM   Specimen: Nasopharyngeal Swab  Result Value Ref Range Status   SARS Coronavirus 2 NEGATIVE NEGATIVE Final    Comment: (NOTE) If result is NEGATIVE SARS-CoV-2 target nucleic acids are NOT DETECTED. The SARS-CoV-2 RNA is generally detectable in upper and lower  respiratory specimens during the acute phase of infection. The lowest  concentration of  SARS-CoV-2 viral copies this assay can detect is 250  copies / mL. A negative result does not preclude SARS-CoV-2 infection  and should not be used as the sole basis for treatment or other  patient management decisions.  A negative result may occur with  improper specimen collection / handling, submission of specimen other  than nasopharyngeal swab, presence of viral mutation(s) within the  areas targeted by this assay, and inadequate number of viral copies  (<250 copies / mL). A negative result must be combined with clinical  observations, patient history, and epidemiological information. If result is POSITIVE SARS-CoV-2 target nucleic acids are DETECTED. The SARS-CoV-2 RNA is generally detectable in upper and lower  respiratory specimens dur ing the acute phase of  infection.  Positive  results are indicative of active infection with SARS-CoV-2.  Clinical  correlation with patient history and other diagnostic information is  necessary to determine patient infection status.  Positive results do  not rule out bacterial infection or co-infection with other viruses. If result is PRESUMPTIVE POSTIVE SARS-CoV-2 nucleic acids MAY BE PRESENT.   A presumptive positive result was obtained on the submitted specimen  and confirmed on repeat testing.  While 2019 novel coronavirus  (SARS-CoV-2) nucleic acids may be present in the submitted sample  additional confirmatory testing may be necessary for epidemiological  and / or clinical management purposes  to differentiate between  SARS-CoV-2 and other Sarbecovirus currently known to infect humans.  If clinically indicated additional testing with an alternate test  methodology [phone]) is advised. The SARS-CoV-2 RNA is generally  detectable in upper and lower respiratory sp ecimens during the acute  phase of infection. The expected result is Negative. Fact Sheet for Patients:  StrictlyIdeas.no Fact Sheet for Healthcare Providers: BankingDealers.co.za This test is not yet approved or cleared by the [place] FDA and has been authorized for detection and/or diagnosis of SARS-CoV-2 by FDA under an Emergency Use Authorization (EUA).  This EUA will remain in effect (meaning this test can be used) for the duration of the COVID-19 declaration under Section 564(b)(1) of the Act, 21 U.S.C. section 360bbb-3(b)(1), unless the authorization is terminated or revoked sooner. Performed at [facility], [address] [place], [place] [address]   Blood Culture (routine x 2)     Status: None (Preliminary result)   Collection Time: 10/08/18  7:32 AM   Specimen: BLOOD  Result Value Ref Range Status   Specimen Description BLOOD RIGHT FA  Final   Special Requests    Final    BOTTLES DRAWN AEROBIC AND ANAEROBIC Blood Culture adequate volume   Culture   Final    NO GROWTH 3 DAYS Performed at [facility], [address], [place], [place] [address]    Report Status PENDING  Incomplete  Blood Culture (routine x 2)     Status: None (Preliminary result)   Collection Time: 10/08/18  7:32 AM   Specimen: BLOOD  Result Value Ref Range Status   Specimen Description BLOOD RIGHT HAND  Final   Special Requests   Final    BOTTLES DRAWN AEROBIC AND ANAEROBIC Blood Culture results may not be optimal due to an excessive volume of blood received in culture bottles   Culture   Final    NO GROWTH 3 DAYS Performed at [facility], [address], [place], [place] [address]    Report Status PENDING  Incomplete  Urine culture     Status: None   Collection Time: 10/08/18  8:35 AM   Specimen: Urine, Random  Result Value Ref Range Status   Specimen Description   Final    URINE, RANDOM Performed at [facility], [address], [place], [place] [address]    Special Requests   Final    NONE Performed at [facility], [address], [place], [place] [address]    Culture   Final    NO GROWTH Performed at [facility], [place] [address] [place], [place] [address]    Report Status 10/09/2018 FINAL  Final  C difficile quick scan w PCR reflex     Status: None   Collection Time: 10/09/18  4:10 AM   Specimen: STOOL  Result Value Ref Range Status   C Diff antigen NEGATIVE NEGATIVE Final   C Diff toxin NEGATIVE NEGATIVE Final   C Diff interpretation No C. difficile detected.  Final    Comment: Performed at [facility], [address] [place], [place] [address]  Body fluid culture     Status: None (Preliminary result)   Collection Time: 10/09/18  5:00 PM   Specimen: [facility] Cytology Pleural fluid  Result Value Ref Range Status   Specimen Description   Final    PLEURAL Performed at [facility], [address] [place], [place] [address]    Special Requests   Final    NONE Performed at [facility], [address] [place], [place] [address]    Gram Stain   Final    FEW WBC PRESENT,BOTH PMN AND MONONUCLEAR NO ORGANISMS SEEN    Culture   Final    NO GROWTH 2 DAYS Performed at [facility], [place] [address], [place], [place] [address]    Report Status PENDING  Incomplete    Coagulation Studies: No results for input(s): LABPROT, INR in the last 72 hours.  Urinalysis: No results for input(s): COLORURINE, LABSPEC, PHURINE, GLUCOSEU, HGBUR, BILIRUBINUR, KETONESUR, PROTEINUR, UROBILINOGEN, NITRITE, LEUKOCYTESUR in the last 72 hours.  Invalid input(s): APPERANCEUR    Imaging: Dg Chest 2 View  Result Date: 10/10/2018 CLINICAL DATA:  Ovarian cancer on chemotherapy, hypertension, CHF, asthma, end-stage renal disease on dialysis, former smoker EXAM: CHEST - 2 VIEW COMPARISON:  10/09/2018 FINDINGS: RIGHT jugular Port-A-Cath with tip projecting over SVC. LEFT jugular dual-lumen central venous catheter with tip above cavoatrial junction. Enlargement of cardiac silhouette with pulmonary vascular congestion. Stable mediastinal contours. Bibasilar effusions and mild LEFT basilar atelectasis. Minimal perihilar edema. No pneumothorax or acute osseous findings. IMPRESSION: Enlargement of cardiac silhouette with pulmonary vascular congestion and mild pulmonary edema. Bibasilar effusions and LEFT LEFT basilar atelectasis. Electronically Signed   By: Lavonia Dana M.D.   On: 10/10/2018 17:28   US Abdomen Limited  Result Date: 10/09/2018 CLINICAL DATA:  History of end-stage renal disease, now with bilateral pleural effusions and intra-abdominal ascites. Please perform ascites search ultrasound and ultrasound-guided paracentesis as indicated. EXAM: LIMITED ABDOMEN ULTRASOUND FOR ASCITES TECHNIQUE: Limited ultrasound survey for ascites was performed in all four abdominal quadrants. COMPARISON:   CT abdomen pelvis-09/21/2018 FINDINGS: Sonographic evaluation demonstrates a trace amount of intra-abdominal ascites, too small to allow for safe ultrasound-guided paracentesis. Note is made of small bilateral pleural effusions, right greater than left. IMPRESSION: 1. Trace amount of intra-abdominal ascites, too small to allow for safe ultrasound-guided paracentesis. 2. Note is made of small bilateral pleural effusions, right greater than left. Patient subsequently underwent ultrasound-guided right-sided thoracentesis. Electronically Signed   By: Eldridge Abrahams.D.  On: 10/09/2018 16:59   US Venous Img Lower Bilateral  Result Date: 10/09/2018 CLINICAL DATA:  Bilateral lower extremity pain and edema. EXAM: BILATERAL LOWER EXTREMITY VENOUS DOPPLER ULTRASOUND TECHNIQUE: Gray-scale sonography with graded compression, as well as color Doppler and duplex ultrasound were performed to evaluate the lower extremity deep venous systems from the level of the common femoral vein and including the common femoral, femoral, profunda femoral, popliteal and calf veins including the posterior tibial, peroneal and gastrocnemius veins when visible. The superficial great saphenous vein was also interrogated. Spectral Doppler was utilized to evaluate flow at rest and with distal augmentation maneuvers in the common femoral, femoral and popliteal veins. COMPARISON:  None. FINDINGS: RIGHT LOWER EXTREMITY Common Femoral Vein: No evidence of thrombus. Normal compressibility, respiratory phasicity and response to augmentation. Saphenofemoral Junction: No evidence of thrombus. Normal compressibility and flow on color Doppler imaging. Profunda Femoral Vein: No evidence of thrombus. Normal compressibility and flow on color Doppler imaging. Femoral Vein: No evidence of thrombus. Normal compressibility, respiratory phasicity and response to augmentation. Popliteal Vein: No evidence of thrombus. Normal compressibility, respiratory phasicity and  response to augmentation. Calf Veins: No evidence of thrombus. Normal compressibility and flow on color Doppler imaging. Superficial Great Saphenous Vein: No evidence of thrombus. Normal compressibility. Venous Reflux:  None. Other Findings: No evidence of superficial thrombophlebitis or abnormal fluid collection. LEFT LOWER EXTREMITY Common Femoral Vein: No evidence of thrombus. Normal compressibility, respiratory phasicity and response to augmentation. Saphenofemoral Junction: No evidence of thrombus. Normal compressibility and flow on color Doppler imaging. Profunda Femoral Vein: No evidence of thrombus. Normal compressibility and flow on color Doppler imaging. Femoral Vein: No evidence of thrombus. Normal compressibility, respiratory phasicity and response to augmentation. Popliteal Vein: No evidence of thrombus. Normal compressibility, respiratory phasicity and response to augmentation. Calf Veins: No evidence of thrombus. Normal compressibility and flow on color Doppler imaging. Superficial Great Saphenous Vein: No evidence of thrombus. Normal compressibility. Venous Reflux:  None. Other Findings: No evidence of superficial thrombophlebitis or abnormal fluid collection. IMPRESSION: No evidence of deep venous thrombosis in either lower extremity. Electronically Signed   By: Aletta Edouard M.D.   On: 10/09/2018 17:41   Dg Chest Port 1 View  Result Date: 10/09/2018 CLINICAL DATA:  Shortness of breath and chest pain, history of recent thoracentesis EXAM: PORTABLE CHEST 1 VIEW COMPARISON:  Film from earlier in the same day. FINDINGS: Cardiac shadow is again enlarged. Left-sided pleural effusion is again seen and slightly larger than that noted on the prior study. Dialysis catheter and right-sided chest wall port are again seen. No pneumothorax is noted. Mild vascular congestion is seen. IMPRESSION: Slight increase in left-sided pleural effusion. This may be positional in nature. Mild vascular congestion.  No  pneumothorax is noted. Electronically Signed   By: Inez Catalina M.D.   On: 10/09/2018 19:45   Dg Chest Port 1 View  Result Date: 10/09/2018 CLINICAL DATA:  Status post right thoracentesis EXAM: PORTABLE CHEST 1 VIEW COMPARISON:  10/08/2018 FINDINGS: Cardiac shadow is stable. Dialysis catheter and right-sided chest wall port are again seen and stable. Postsurgical changes in the cervical spine are noted. Small left pleural effusion is noted. Right-sided pleural effusion has resolved following thoracentesis. No pneumothorax is noted. IMPRESSION: No pneumothorax following right thoracentesis. Small left pleural effusion. Electronically Signed   By: Inez Catalina M.D.   On: 10/09/2018 16:53   US Thoracentesis Asp Pleural Space W/img Guide  Result Date: 10/09/2018 INDICATION: History of end-stage renal disease, now with  symptomatic pleural effusions. Please perform chest ultrasound and ultrasound-guided thoracentesis of the side with greater volume of fluid for therapeutic and diagnostic purposes. EXAM: US THORACENTESIS ASP PLEURAL SPACE W/IMG GUIDE COMPARISON:  Chest radiograph-10/08/2018; CT abdomen pelvis-09/21/2018 MEDICATIONS: None. COMPLICATIONS: None immediate. TECHNIQUE: Informed written consent was obtained from the patient after a discussion of the risks, benefits and alternatives to treatment. A timeout was performed prior to the initiation of the procedure. Initial ultrasound scanning demonstrates small bilateral pleural effusions, right greater than left. As such the decision was made to proceed with ultrasound-guided right-sided thoracentesis. With the patient positioned left lateral decubitus, the posterolateral aspect of the right chest was prepped and draped in the usual sterile fashion. 1% lidocaine was used for local anesthesia. An ultrasound image was saved for documentation purposes. An 8 Fr Safe-T-Centesis catheter was introduced. The thoracentesis was performed. The catheter was removed and  a dressing was applied. The patient tolerated the procedure well without immediate post procedural complication. The patient was escorted to have an upright chest radiograph. FINDINGS: A total of approximately 700 cc liters of serous fluid was removed. Requested samples were sent to the laboratory. IMPRESSION: Successful ultrasound-guided right sided thoracentesis yielding 700 cc of pleural fluid. Electronically Signed   By: Sandi Mariscal M.D.   On: 10/09/2018 17:01     Medications:   . sodium chloride 250 mL (10/10/18 2239)  . amiodarone 60 mg/hr (10/11/18 1307)   Followed by  . amiodarone    . ceFEPime (MAXIPIME) IV 2 g (10/10/18 1159)  . vancomycin 750 mg (10/10/18 2245)   . apixaban  5 mg Oral BID  . Chlorhexidine Gluconate Cloth  6 each Topical Q0600  . Chlorhexidine Gluconate Cloth  6 each Topical Q0600  . diltiazem  60 mg Oral Q6H  . DULoxetine  60 mg Oral Daily  . [START ON 10/13/2018] epoetin (EPOGEN/PROCRIT) injection  10,000 Units Intravenous Q T,Th,Sa-HD  . ferrous sulfate  325 mg Oral Daily  . furosemide  40 mg Intravenous Q12H  . hydrALAZINE  25 mg Oral TID  . ipratropium-albuterol  3 mL Nebulization Q6H  . isosorbide mononitrate  30 mg Oral Daily  . levothyroxine  25 mcg Oral QAC breakfast  . loratadine  10 mg Oral Daily  . magnesium oxide  400 mg Oral BID  . metoprolol succinate  50 mg Oral Daily  . sodium chloride flush  3 mL Intravenous Q12H  . vitamin B-12  1,000 mcg Oral Daily  . vitamin C  500 mg Oral Daily   sodium chloride, acetaminophen, cyclobenzaprine, hydrOXYzine, LORazepam, metoCLOPramide, metoprolol tartrate, ondansetron, oxyCODONE-acetaminophen **AND** oxyCODONE, polyethylene glycol, sodium chloride flush, sodium chloride flush, temazepam  Assessment/ Plan:  63 y.o. female with history ofovarian cancer stage III,GERD, migraine headaches, cervical spinal stenosis, who was admitted for shortness of breath and fever.  [facility] [facility]  1.  ESRD on  HD TTS.  Patient completed dialysis yesterday.  She did develop atrial fibrillation with rapid ventricular response yesterday.  Cardiology following on the case.  Next Alysis treatment for Tuesday.  2.  Fever.  Patient was found to COVID 19-.  Also neutropenic.  Continue vancomycin and cefepime.  3.  Anemia of chronic kidney disease.  Hemoglobin up to 9.4.  Maintain the patient on Epogen at this time.  4.  Secondary hyperparathyroidism.  Phosphorus at 3.0 and acceptable.  Continue to monitor bone mineral metabolism parameters.   LOS: 3 Malakie Balis 7/26/20202:22 PM

## 2018-10-11 NOTE — Plan of Care (Signed)

## 2018-10-11 NOTE — Plan of Care (Signed)
  Problem: Activity: Goal: Risk for activity intolerance will decrease Outcome: Progressing   Problem: Safety: Goal: Ability to remain free from injury will improve Outcome: Progressing   Problem: Clinical Measurements: Goal: Cardiovascular complication will be avoided Outcome: Not Progressing Note: Patient converted to afib during HD. Currently on cardizem gtt

## 2018-10-12 ENCOUNTER — Inpatient Hospital Stay: Payer: PPO

## 2018-10-12 DIAGNOSIS — I480 Paroxysmal atrial fibrillation: Secondary | ICD-10-CM

## 2018-10-12 MED ORDER — ACETAMINOPHEN 325 MG PO TABS: 650.0000 mg | ORAL_TABLET | Freq: Four times a day (QID) | ORAL | Status: DC | PRN

## 2018-10-12 MED ORDER — AMIODARONE HCL 200 MG PO TABS
400.0000 mg | ORAL_TABLET | Freq: Two times a day (BID) | ORAL | Status: DC
  Administered 2018-10-12 – 2018-10-14 (×5): 400 mg via ORAL
  Filled 2018-10-12 (×5): qty 2

## 2018-10-12 MED ORDER — LORAZEPAM 0.5 MG PO TABS
0.5000 mg | ORAL_TABLET | Freq: Four times a day (QID) | ORAL | Status: DC | PRN
  Administered 2018-10-12 – 2018-10-13 (×2): 0.5 mg via ORAL
  Filled 2018-10-12 (×3): qty 1

## 2018-10-12 MED ORDER — AMIODARONE HCL 200 MG PO TABS: 200.0000 mg | ORAL_TABLET | Freq: Two times a day (BID) | ORAL | Status: DC

## 2018-10-12 MED ORDER — AMOXICILLIN-POT CLAVULANATE 500-125 MG PO TABS
1.0000 | ORAL_TABLET | Freq: Two times a day (BID) | ORAL | Status: DC
  Filled 2018-10-12: qty 1

## 2018-10-12 MED ORDER — AMOXICILLIN-POT CLAVULANATE 500-125 MG PO TABS
1.0000 | ORAL_TABLET | Freq: Every day | ORAL | Status: DC
  Administered 2018-10-12 – 2018-10-13 (×2): 500 mg via ORAL
  Filled 2018-10-12 (×3): qty 1

## 2018-10-12 MED ORDER — METOPROLOL SUCCINATE ER 100 MG PO TB24
100.0000 mg | ORAL_TABLET | Freq: Every day | ORAL | Status: DC
  Administered 2018-10-12 – 2018-10-14 (×3): 100 mg via ORAL
  Filled 2018-10-12 (×3): qty 1

## 2018-10-12 MED ORDER — ACETAMINOPHEN 325 MG PO TABS
650.0000 mg | ORAL_TABLET | Freq: Four times a day (QID) | ORAL | Status: DC | PRN
  Administered 2018-10-12: 650 mg via ORAL
  Filled 2018-10-12: qty 2

## 2018-10-12 NOTE — Progress Notes (Signed)
Ch visited with pt to determine how well pt was progressing seen admission. Pt had a positive affect and was mobile with the assistance of a walker and did not present to have labored breathing. Ch allowed space for pt to lament about her recent diagnosis as an Afib pt. Ch asked guided questions that would help the writer to gain an understanding of what lead to the recent health changes. Pt shared that she has ovarian cancer that she is getting treatment for. Pt could not understand why she was not able to have her cervix removed as she requested due to how far the cancer spread. Ch helped pt to process her grief related to feeling unheard by her provider(s) related to the cancer and CHF. Pt is hopeful that she temporarily receives HD but has accepted possibly remaining on it for longer if need be. Pt is a person of faith that loves to be of service of others when her health permits and desires to have a quality of life that would allow her to continue gardening, walking independently, and driving to help her neighbors/family members. Ch provided words of encouragement and compassionate presence for pt before she received her breathing treatment.    10/12/18 1400  Clinical Encounter Type  Visited With Patient  Visit Type Follow-up;Psychological support;Spiritual support;Social support  Spiritual Encounters  Spiritual Needs Ritual;Emotional;Grief support  Stress Factors  Patient Stress Factors Exhausted;Major life changes;Loss of control;Health changes  Family Stress Factors None identified

## 2018-10-12 NOTE — Progress Notes (Signed)
[place]  Telephone:([phone]) [phone] Fax:(336) [phone]  ID: [name] OB: [birth year]  MR#: [id number]  UVO#:[id number]  Patient Care Team: Nelva Bush, MD as PCP - Cardiology (Cardiology) Lloyd Huger, MD as Consulting Physician (Oncology) Mellody Drown, MD as Consulting Physician (Obstetrics and Gynecology) Cathi Roan, [facility] (Pharmacist) Benedetto Goad, RN as Case Manager Clent Jacks, RN as Registered Nurse  CHIEF COMPLAINT: Progressive ovarian cancer, neutropenic fever, end-stage renal disease.  INTERVAL HISTORY: Patient significantly improved today. She has no further fevers. She continues to have chronic shortness of breath.  She does not complain of abdominal pain today.  She offers no further specific complaints today.  REVIEW OF SYSTEMS:   Review of Systems  Constitutional: Negative.  Negative for fever, malaise/fatigue and weight loss.  Respiratory: Positive for shortness of breath. Negative for cough and hemoptysis.   Cardiovascular: Positive for leg swelling. Negative for chest pain.  Gastrointestinal: Negative.  Negative for abdominal pain and nausea.  Genitourinary: Negative.  Negative for dysuria.  Musculoskeletal: Negative.  Negative for back pain.  Skin: Negative.  Negative for rash.  Neurological: Negative.  Negative for dizziness, focal weakness, weakness and headaches.  Psychiatric/Behavioral: Negative.  The patient is not nervous/anxious.     As per HPI. Otherwise, a complete review of systems is negative.  PAST MEDICAL HISTORY: Past Medical History:  Diagnosis Date   Allergic rhinitis, cause unspecified    Anxiety state, unspecified    Arthritis    Asthma    only when sick    Backache, unspecified    Bronchitis    hx of when get sick   Cancer [facility])    skin cancer , basal cell    Cancer ([place]) 11/2016   ovarian   Cervicalgia    CHF (congestive heart failure) (HCC)    Complication of  anesthesia    Dermatophytosis of nail    Dysmetabolic syndrome X    Encounter for long-term (current) use of other medications    Esophageal reflux    Hypertension    Insomnia, unspecified    Leukocytosis, unspecified    Migraine without aura, without mention of intractable migraine without mention of status migrainosus    Other and unspecified hyperlipidemia    Other malaise and fatigue    Overweight(278.02)    Personal history of chemotherapy now   ovarian   PONV (postoperative nausea and vomiting)    Renal insufficiency    Spinal stenosis in cervical region    Symptomatic menopausal or female climacteric states    Unspecified disorder of skin and subcutaneous tissue    Unspecified vitamin D deficiency     PAST SURGICAL HISTORY: Past Surgical History:  Procedure Laterality Date   ABDOMINAL HYSTERECTOMY     ANTERIOR CERVICAL DECOMP/DISCECTOMY FUSION N/A 06/07/2015   Procedure: Cervical three - four and Cervical six- seven anterior cervical decompression with fusion interbody prosthesis plating and bonegraft;  Surgeon: Newman Pies, MD;  Location: [place] [facility];  Service: Neurosurgery;  Laterality: N/A;  C34 and C67 anterior cervical decompression with fusion interbody prosthesis plating and bonegraft   BACK SURGERY     x2 Lower    DIALYSIS/PERMA CATHETER INSERTION N/A 08/17/2018   Procedure: DIALYSIS/PERMA CATHETER INSERTION;  Surgeon: Algernon Huxley, MD;  Location: [place] CV LAB;  Service: Cardiovascular;  Laterality: N/A;   EVACUATION OF CERVICAL HEMATOMA N/A 06/14/2015   Procedure: EVACUATION OF CERVICAL HEMATOMA;  Surgeon: Newman Pies, MD;  Location: [place] [facility]UR[facility] ORS;  Service: Neurosurgery;  Laterality: N/A;   NECK SURGERY     x3   TONSILLECTOMY      FAMILY HISTORY: Family History  Problem Relation Age of Onset   Depression Mother    Migraines Mother    Dementia Father    Diabetes Father    Hyperlipidemia Father    Hyperlipidemia  Brother    Hyperlipidemia Brother    Breast cancer Paternal Aunt        65s    ADVANCED DIRECTIVES (Y/N):  _0 @  HEALTH MAINTENANCE: Social History   Tobacco Use   Smoking status: Former Smoker    Packs/day: 1.50    Years: 20.00    Pack years: 30.00    Types: Cigarettes    Start date: 03/19/1979    Quit date: 08/25/1999    Years since quitting: 19.1   Smokeless tobacco: Never Used   Tobacco comment: smoking cessation materials not required  Substance Use Topics   Alcohol use: Not Currently    Alcohol/week: 0.0 standard drinks   Drug use: No     Colonoscopy:  PAP:  Bone density:  Lipid panel:  No Known Allergies  Current Facility-Administered Medications  Medication Dose Route Frequency Provider Last Rate Last Dose   0.9 %  sodium chloride infusion   Intravenous PRN Lang Snow, NP 10 mL/hr at 10/10/18 2239 250 mL at 10/10/18 2239   acetaminophen (TYLENOL) tablet 650 mg  650 mg Oral Q6H PRN Dustin Flock, MD   650 mg at 10/12/18 1147   amiodarone (PACERONE) tablet 400 mg  400 mg Oral BID Marrianne Mood D, PA-C   400 mg at 10/12/18 2049   Followed by   Derrill Memo ON 10/17/2018] amiodarone (PACERONE) tablet 200 mg  200 mg Oral BID Mickle Plumb, Jacquelyn D, PA-C       amoxicillin-clavulanate (AUGMENTIN) 500-125 MG per tablet 500 mg  1 tablet Oral Daily Dustin Flock, MD   500 mg at 10/12/18 2049   Chlorhexidine Gluconate Cloth 2 % PADS 6 each  6 each Topical Q0600 Loletha Grayer, MD   6 each at 10/10/18 0867   Chlorhexidine Gluconate Cloth 2 % PADS 6 each  6 each Topical Q0600 Holley Raring, Munsoor, MD   6 each at 10/12/18 0603   cyclobenzaprine (FLEXERIL) tablet 10 mg  10 mg Oral Q8H PRN Lang Snow, NP       DULoxetine (CYMBALTA) DR capsule 60 mg  60 mg Oral Daily Lang Snow, NP   60 mg at 10/12/18 0948   [START ON 10/13/2018] epoetin alfa (EPOGEN) injection 10,000 Units  10,000 Units Intravenous Q T,Th,Sa-HD Lateef, Munsoor,  MD       ferrous sulfate tablet 325 mg  325 mg Oral Daily Lang Snow, NP   325 mg at 10/12/18 0949   furosemide (LASIX) injection 40 mg  40 mg Intravenous Q12H Dustin Flock, MD   40 mg at 10/12/18 2048   hydrALAZINE (APRESOLINE) tablet 25 mg  25 mg Oral TID Lang Snow, NP   25 mg at 10/12/18 2048   hydrOXYzine (ATARAX/VISTARIL) tablet 10 mg  10 mg Oral Q6H PRN Lang Snow, NP   10 mg at 10/11/18 2034   ipratropium-albuterol (DUONEB) 0.5-2.5 (3) MG/3ML nebulizer solution 3 mL  3 mL Nebulization Q6H Lang Snow, NP   3 mL at 10/12/18 1932   isosorbide mononitrate (IMDUR) 24 hr tablet 30 mg  30 mg Oral Daily Lang Snow, NP   30 mg at  10/12/18 0948   levothyroxine (SYNTHROID) tablet 25 mcg  25 mcg Oral QAC breakfast Lang Snow, NP   25 mcg at 10/12/18 0604   loratadine (CLARITIN) tablet 10 mg  10 mg Oral Daily Lang Snow, NP   10 mg at 10/12/18 0949   LORazepam (ATIVAN) tablet 0.5 mg  0.5 mg Oral Q6H PRN Dustin Flock, MD   0.5 mg at 10/12/18 1647   magnesium oxide (MAG-OX) tablet 400 mg  400 mg Oral BID Lang Snow, NP   400 mg at 10/12/18 2048   metoCLOPramide (REGLAN) tablet 5 mg  5 mg Oral Q8H PRN Lang Snow, NP       metoprolol succinate (TOPROL-XL) 24 hr tablet 100 mg  100 mg Oral Daily Mickle Plumb, Jacquelyn D, PA-C   100 mg at 10/12/18 0949   metoprolol tartrate (LOPRESSOR) injection 5 mg  5 mg Intravenous Q1H PRN Loletha Grayer, MD   5 mg at 10/09/18 1833   ondansetron (ZOFRAN-ODT) disintegrating tablet 8 mg  8 mg Oral Q8H PRN Lang Snow, NP       oxyCODONE-acetaminophen (PERCOCET/ROXICET) 5-325 MG per tablet 1 tablet  1 tablet Oral Q6H PRN Lang Snow, NP   1 tablet at 10/12/18 9728   And   oxyCODONE (Oxy IR/ROXICODONE) immediate release tablet 5 mg  5 mg Oral Q6H PRN Lang Snow, NP   5 mg at 10/12/18 0959   polyethylene  glycol (MIRALAX / GLYCOLAX) packet 17 g  17 g Oral Daily PRN Lang Snow, NP       sodium chloride flush (NS) 0.9 % injection 10-40 mL  10-40 mL Intracatheter PRN Dustin Flock, MD   10 mL at 10/10/18 2236   sodium chloride flush (NS) 0.9 % injection 3 mL  3 mL Intravenous Q12H Lang Snow, NP   3 mL at 10/12/18 2055   sodium chloride flush (NS) 0.9 % injection 3 mL  3 mL Intravenous PRN Lang Snow, NP       temazepam (RESTORIL) capsule 30 mg  30 mg Oral QHS PRN Lang Snow, NP   30 mg at 10/12/18 2053   vitamin B-12 (CYANOCOBALAMIN) tablet 1,000 mcg  1,000 mcg Oral Daily Lang Snow, NP   1,000 mcg at 10/12/18 2060   vitamin C (ASCORBIC ACID) tablet 500 mg  500 mg Oral Daily Lang Snow, NP   500 mg at 10/12/18 1561   Facility-Administered Medications Ordered in Other Encounters  Medication Dose Route Frequency Provider Last Rate Last Dose   0.9 %  sodium chloride infusion   Intravenous Once Lloyd Huger, MD       0.9 %  sodium chloride infusion   Intravenous Once Lloyd Huger, MD       sodium chloride flush (NS) 0.9 % injection 10 mL  10 mL Intravenous PRN Lloyd Huger, MD   10 mL at 06/16/17 0854    OBJECTIVE: Vitals:   10/12/18 1933 10/12/18 1954  BP:  (!) 148/80  Pulse:  67  Resp:  20  Temp:  (!) 97.4 F (36.3 C)  SpO2: 97% 97%     Body mass index is 28.83 kg/m.    ECOG FS:1 - Symptomatic but completely ambulatory  General: Well-developed, well-nourished, no acute distress. Eyes: Pink conjunctiva, anicteric sclera. HEENT: Normocephalic, moist mucous membranes, clear oropharnyx. Lungs: Diminished breath sounds bilaterally. Heart: Regular rate and rhythm. No rubs, murmurs, or gallops. Abdomen: Soft, nontender, nondistended.  No organomegaly noted, normoactive bowel sounds. Musculoskeletal: No edema, cyanosis, or clubbing. Neuro: Alert, answering all questions appropriately.  Cranial nerves grossly intact. Skin: No rashes or petechiae noted. Psych: Normal affect.   LAB RESULTS:  Lab Results  Component Value Date   NA 137 10/11/2018   K 3.9 10/11/2018   CL 98 10/11/2018   CO2 29 10/11/2018   GLUCOSE 115 (H) 10/11/2018   BUN 17 10/11/2018   CREATININE 2.35 (H) 10/11/2018   CALCIUM 8.2 (L) 10/11/2018   PROT 5.8 (L) 10/08/2018   ALBUMIN 2.9 (L) 10/08/2018   AST 33 10/08/2018   ALT 24 10/08/2018   ALKPHOS 95 10/08/2018   BILITOT 0.9 10/08/2018   GFRNONAA 21 (L) 10/11/2018   GFRAA 25 (L) 10/11/2018    Lab Results  Component Value Date   WBC 3.2 (L) 10/11/2018   NEUTROABS 1.0 (L) 10/09/2018   HGB 9.4 (L) 10/11/2018   HCT 30.1 (L) 10/11/2018   MCV 104.9 (H) 10/11/2018   PLT 122 (L) 10/11/2018     STUDIES: Ct Abdomen Pelvis Wo Contrast  Result Date: 09/21/2018 CLINICAL DATA:  Nausea and vomiting. EXAM: CT ABDOMEN AND PELVIS WITHOUT CONTRAST TECHNIQUE: Multidetector CT imaging of the abdomen and pelvis was performed following the standard protocol without IV contrast. COMPARISON:  CT 08/06/2018. PET CT 05/07/2018 FINDINGS: Lower chest: Moderate bilateral pleural effusions with compressive atelectasis. Cardiomegaly. Hepatobiliary: No focal hepatic abnormality on noncontrast exam. Postcholecystectomy with unchanged biliary prominence. Pancreas: No ductal dilatation. Mild stranding in the upper abdomen, without discrete peripancreatic inflammation. Spleen: Normal in size without focal abnormality. Adrenals/Urinary Tract: No adrenal nodule. No hydronephrosis. Mild symmetric bilateral perinephric edema. No urolithiasis. Urinary bladder is nondistended. Stomach/Bowel: Stomach is distended with ingested contents. No gastric wall thickening. No small bowel dilatation or obvious inflammation. No obstruction. Appendix not definitively visualized. Large volume of stool throughout the colon. No colonic wall thickening or inflammatory change. No abnormal rectal  distention. Vascular/Lymphatic: Aortic atherosclerosis. No aneurysm. Limited assessment for adenopathy given ascites and lack contrast. No bulky abdominopelvic adenopathy. Reproductive: Status post hysterectomy. No adnexal masses. Other: Increase abdominopelvic ascites, no small to moderate. Increased mesenteric and generalized soft tissue edema from prior. Soft tissue nodularity in the left upper omentum, images 34 and 37, slight increase from prior. Questionable new omental nodularity anteriorly in the midline, image 46 series 3. Postsurgical change of the anterior abdominal wall. No free air. Small ventral abdominal wall hernia contains fat. Musculoskeletal: Postsurgical and degenerative change in the spine. There are no acute or suspicious osseous abnormalities. IMPRESSION: 1. Increased abdominopelvic ascites and generalized mesenteric and soft tissue edema. Slight increased soft tissue nodularity in the left upper omentum, possible new omental nodularity anteriorly. 2. Large colonic stool burden suggesting constipation. No bowel obstruction. Ingested material distends the stomach, if there is been no recent p.o. intake, question gastroparesis. 3. Moderate bilateral pleural effusions with compressive atelectasis. 4.  Aortic Atherosclerosis (ICD10-I70.0). Electronically Signed   By: Keith Rake M.D.   On: 09/21/2018 01:01   Dg Chest 2 View  Result Date: 10/10/2018 CLINICAL DATA:  Ovarian cancer on chemotherapy, hypertension, CHF, asthma, end-stage renal disease on dialysis, former smoker EXAM: CHEST - 2 VIEW COMPARISON:  10/09/2018 FINDINGS: RIGHT jugular Port-A-Cath with tip projecting over SVC. LEFT jugular dual-lumen central venous catheter with tip above cavoatrial junction. Enlargement of cardiac silhouette with pulmonary vascular congestion. Stable mediastinal contours. Bibasilar effusions and mild LEFT basilar atelectasis. Minimal perihilar edema. No pneumothorax or acute osseous findings.  IMPRESSION: Enlargement  of cardiac silhouette with pulmonary vascular congestion and mild pulmonary edema. Bibasilar effusions and LEFT LEFT basilar atelectasis. Electronically Signed   By: Lavonia Dana M.D.   On: 10/10/2018 17:28   Ct Chest Wo Contrast  Result Date: 10/12/2018 CLINICAL DATA:  Pleural effusion. Clinical sepsis and neutropenic fever. Status post thoracentesis. EXAM: CT CHEST WITHOUT CONTRAST TECHNIQUE: Multidetector CT imaging of the chest was performed following the standard protocol without IV contrast. COMPARISON:  Chest CT 08/09/2018 FINDINGS: Cardiovascular: The heart is enlarged but stable. No pericardial effusion. Stable tortuosity, ectasia and calcification of the thoracic aorta. Scattered coronary artery calcifications. Right-sided Port-A-Cath and left IJ dialysis catheter in good positions without complicating features. Mediastinum/Nodes: Stable scattered mediastinal and hilar lymph nodes without mass or overt adenopathy. The esophagus is grossly normal. Lungs/Pleura: Moderate-sized bilateral pleural effusions with overlying atelectasis. Patchy inflammatory changes in both lungs with areas of tree-in-bud appearance suggesting chronic inflammation or atypical infection such as MAC or viral pneumonitis. No worrisome pulmonary lesions. No endobronchial lesions are identified. Upper Abdomen: No significant upper abdominal findings. Musculoskeletal: No significant bony findings. No breast masses, supraclavicular or axillary adenopathy. IMPRESSION: 1. Moderate-sized bilateral pleural effusions with overlying atelectasis. 2. Patchy inflammatory or atypical infectious process in the lungs. No focal pneumonia. No worrisome pulmonary lesions. 3. Stable cardiac enlargement and tortuosity and calcification of the thoracic aorta. 4. Stable mediastinal and hilar lymph nodes without mass or overt adenopathy. Aortic Atherosclerosis (ICD10-I70.0). Electronically Signed   By: Marijo Sanes M.D.   On:  10/12/2018 12:26   US Abdomen Limited  Result Date: 10/09/2018 CLINICAL DATA:  History of end-stage renal disease, now with bilateral pleural effusions and intra-abdominal ascites. Please perform ascites search ultrasound and ultrasound-guided paracentesis as indicated. EXAM: LIMITED ABDOMEN ULTRASOUND FOR ASCITES TECHNIQUE: Limited ultrasound survey for ascites was performed in all four abdominal quadrants. COMPARISON:  CT abdomen pelvis-09/21/2018 FINDINGS: Sonographic evaluation demonstrates a trace amount of intra-abdominal ascites, too small to allow for safe ultrasound-guided paracentesis. Note is made of small bilateral pleural effusions, right greater than left. IMPRESSION: 1. Trace amount of intra-abdominal ascites, too small to allow for safe ultrasound-guided paracentesis. 2. Note is made of small bilateral pleural effusions, right greater than left. Patient subsequently underwent ultrasound-guided right-sided thoracentesis. Electronically Signed   By: Sandi Mariscal M.D.   On: 10/09/2018 16:59   US Venous Img Lower Bilateral  Result Date: 10/09/2018 CLINICAL DATA:  Bilateral lower extremity pain and edema. EXAM: BILATERAL LOWER EXTREMITY VENOUS DOPPLER ULTRASOUND TECHNIQUE: Gray-scale sonography with graded compression, as well as color Doppler and duplex ultrasound were performed to evaluate the lower extremity deep venous systems from the level of the common femoral vein and including the common femoral, femoral, profunda femoral, popliteal and calf veins including the posterior tibial, peroneal and gastrocnemius veins when visible. The superficial great saphenous vein was also interrogated. Spectral Doppler was utilized to evaluate flow at rest and with distal augmentation maneuvers in the common femoral, femoral and popliteal veins. COMPARISON:  None. FINDINGS: RIGHT LOWER EXTREMITY Common Femoral Vein: No evidence of thrombus. Normal compressibility, respiratory phasicity and response to  augmentation. Saphenofemoral Junction: No evidence of thrombus. Normal compressibility and flow on color Doppler imaging. Profunda Femoral Vein: No evidence of thrombus. Normal compressibility and flow on color Doppler imaging. Femoral Vein: No evidence of thrombus. Normal compressibility, respiratory phasicity and response to augmentation. Popliteal Vein: No evidence of thrombus. Normal compressibility, respiratory phasicity and response to augmentation. Calf Veins: No evidence of thrombus. Normal compressibility and  flow on color Doppler imaging. Superficial Great Saphenous Vein: No evidence of thrombus. Normal compressibility. Venous Reflux:  None. Other Findings: No evidence of superficial thrombophlebitis or abnormal fluid collection. LEFT LOWER EXTREMITY Common Femoral Vein: No evidence of thrombus. Normal compressibility, respiratory phasicity and response to augmentation. Saphenofemoral Junction: No evidence of thrombus. Normal compressibility and flow on color Doppler imaging. Profunda Femoral Vein: No evidence of thrombus. Normal compressibility and flow on color Doppler imaging. Femoral Vein: No evidence of thrombus. Normal compressibility, respiratory phasicity and response to augmentation. Popliteal Vein: No evidence of thrombus. Normal compressibility, respiratory phasicity and response to augmentation. Calf Veins: No evidence of thrombus. Normal compressibility and flow on color Doppler imaging. Superficial Great Saphenous Vein: No evidence of thrombus. Normal compressibility. Venous Reflux:  None. Other Findings: No evidence of superficial thrombophlebitis or abnormal fluid collection. IMPRESSION: No evidence of deep venous thrombosis in either lower extremity. Electronically Signed   By: Aletta Edouard M.D.   On: 10/09/2018 17:41   Dg Chest Port 1 View  Result Date: 10/09/2018 CLINICAL DATA:  Shortness of breath and chest pain, history of recent thoracentesis EXAM: PORTABLE CHEST 1 VIEW  COMPARISON:  Film from earlier in the same day. FINDINGS: Cardiac shadow is again enlarged. Left-sided pleural effusion is again seen and slightly larger than that noted on the prior study. Dialysis catheter and right-sided chest wall port are again seen. No pneumothorax is noted. Mild vascular congestion is seen. IMPRESSION: Slight increase in left-sided pleural effusion. This may be positional in nature. Mild vascular congestion.  No pneumothorax is noted. Electronically Signed   By: Inez Catalina M.D.   On: 10/09/2018 19:45   Dg Chest Port 1 View  Result Date: 10/09/2018 CLINICAL DATA:  Status post right thoracentesis EXAM: PORTABLE CHEST 1 VIEW COMPARISON:  10/08/2018 FINDINGS: Cardiac shadow is stable. Dialysis catheter and right-sided chest wall port are again seen and stable. Postsurgical changes in the cervical spine are noted. Small left pleural effusion is noted. Right-sided pleural effusion has resolved following thoracentesis. No pneumothorax is noted. IMPRESSION: No pneumothorax following right thoracentesis. Small left pleural effusion. Electronically Signed   By: Inez Catalina M.D.   On: 10/09/2018 16:53   Dg Chest Port 1 View  Result Date: 10/08/2018 CLINICAL DATA:  Shortness of breath. EXAM: PORTABLE CHEST 1 VIEW COMPARISON:  Chest x-ray dated August 28, 2018. FINDINGS: Unchanged right chest wall port catheter and tunneled left internal jugular dialysis catheter. Stable cardiomegaly and pulmonary vascular congestion. Unchanged small bilateral pleural effusions. Slightly increased bibasilar atelectasis. No pneumothorax. No acute osseous abnormality. IMPRESSION: 1. Mild congestive heart failure, similar to prior study. Electronically Signed   By: Titus Dubin M.D.   On: 10/08/2018 08:07   US Thoracentesis Asp Pleural Space W/img Guide  Result Date: 10/09/2018 INDICATION: History of end-stage renal disease, now with symptomatic pleural effusions. Please perform chest ultrasound and  ultrasound-guided thoracentesis of the side with greater volume of fluid for therapeutic and diagnostic purposes. EXAM: US THORACENTESIS ASP PLEURAL SPACE W/IMG GUIDE COMPARISON:  Chest radiograph-10/08/2018; CT abdomen pelvis-09/21/2018 MEDICATIONS: None. COMPLICATIONS: None immediate. TECHNIQUE: Informed written consent was obtained from the patient after a discussion of the risks, benefits and alternatives to treatment. A timeout was performed prior to the initiation of the procedure. Initial ultrasound scanning demonstrates small bilateral pleural effusions, right greater than left. As such the decision was made to proceed with ultrasound-guided right-sided thoracentesis. With the patient positioned left lateral decubitus, the posterolateral aspect of the right chest was  prepped and draped in the usual sterile fashion. 1% lidocaine was used for local anesthesia. An ultrasound image was saved for documentation purposes. An 8 Fr Safe-T-Centesis catheter was introduced. The thoracentesis was performed. The catheter was removed and a dressing was applied. The patient tolerated the procedure well without immediate post procedural complication. The patient was escorted to have an upright chest radiograph. FINDINGS: A total of approximately 700 cc liters of serous fluid was removed. Requested samples were sent to the laboratory. IMPRESSION: Successful ultrasound-guided right sided thoracentesis yielding 700 cc of pleural fluid. Electronically Signed   By: Sandi Mariscal M.D.   On: 10/09/2018 17:01    ASSESSMENT: Progressive ovarian cancer, neutropenic fever, end-stage renal disease.  PLAN:   1. Stage IIIc high-grade serous ovarian carcinoma: Patient last received chemotherapy with single agent gemcitabine on October 06, 2018.  Her pancytopenia has improved. Will arrange follow up in the [place] on Thursday. 2.  End-stage renal disease: Continue dialysis per nephrology. 3.  Neutropenic fever/Neutropenia:  Resolved. No source identified. 4.  Anemia: Patient's hemoglobin is decreased, but relatively stable at 9.4.  Okay to give Procrit with dialysis if needed. 6.  Thrombocytopenia: Significantly improved. 7.  Pleural effusion: Status post thoracentesis, possible repeat tomorrow. 8.  A-fib: Appreciate cardiology input.  Currently on amiodarone and Eliquis.  Appreciate consult, will follow.   Patient expressed understanding and was in agreement with this plan. She also understands that She can call clinic at any time with any questions, concerns, or complaints.   Cancer Staging Ovarian cancer, unspecified laterality ([place]) Staging form: Ovary, Fallopian Tube, and Primary Peritoneal Carcinoma, AJCC 8th Edition - Clinical: No stage assigned - Unsigned   Lloyd Huger, MD   10/12/2018 9:27 PM

## 2018-10-12 NOTE — Progress Notes (Signed)
Patient ID: Jessica Burgess, female   DOB: [birth year], 63 y.o.   MRN: [id number]  [facility] PROGRESS NOTE  Jessica Burgess:[id number] DOB: [birth year] DOA: 10/08/2018 PCP: No primary care provider on file.  HPI/Subjective: Patient continues to be short of breath denies any chest pain   Objective: Vitals:   10/12/18 0808 10/12/18 0946  BP: 138/72 133/76  Pulse: 64 73  Resp: 16   Temp: 97.7 F (36.5 C) 98 F (36.7 C)  SpO2: 99%     Intake/Output Summary (Last 24 hours) at 10/12/2018 1428 Last data filed at 10/12/2018 1036 Gross per 24 hour  Intake 286.66 ml  Output 100 ml  Net 186.66 ml   Filed Weights   10/10/18 2202 10/11/18 0414 10/12/18 0345  Weight: 71.8 kg 72.5 kg 71.5 kg    ROS: Review of Systems  Constitutional: Positive for chills. Negative for fever.  Eyes: Negative for blurred vision.  Respiratory: Positive for shortness of breath. Negative for cough.   Cardiovascular: Negative for chest pain.  Gastrointestinal: Positive for abdominal pain. Negative for constipation, diarrhea, nausea and vomiting.  Genitourinary: Negative for dysuria.  Musculoskeletal: Negative for joint pain.  Neurological: Negative for dizziness and headaches.   Exam: Physical Exam  Constitutional: She is oriented to person, place, and time.  HENT:  Nose: No mucosal edema.  Mouth/Throat: No oropharyngeal exudate or posterior oropharyngeal edema.  Eyes: Pupils are equal, round, and reactive to light. Conjunctivae, EOM and lids are normal.  Neck: No JVD present. Carotid bruit is not present. No edema present. No thyroid mass and no thyromegaly present.  Cardiovascular: S1 normal and S2 normal. Exam reveals no gallop.  No murmur heard. Pulses:      Dorsalis pedis pulses are 2+ on the right side and 2+ on the left side.  Respiratory: No respiratory distress. She has decreased breath sounds in the right lower field and the left lower field. She has no wheezes. She has no  rhonchi. She has no rales.  GI: Soft. Bowel sounds are normal. She exhibits distension. There is abdominal tenderness.  Musculoskeletal:     Right ankle: She exhibits swelling.     Left ankle: She exhibits swelling.  Lymphadenopathy:    She has no cervical adenopathy.  Neurological: She is alert and oriented to person, place, and time. No cranial nerve deficit.  Skin: Skin is warm. No rash noted. Nails show no clubbing.  Psychiatric: She has a normal mood and affect.      Data Reviewed: Basic Metabolic Panel: Recent Labs  Lab 10/06/18 1313 10/08/18 0731 10/09/18 0546 10/10/18 0644 10/10/18 1750 10/11/18 0522  NA 138 130* 130* 132*  --  137  K 5.2* 5.0 5.8* 4.1  --  3.9  CL 97* 96* 93* 95*  --  98  CO2 32 _0 --  29  GLUCOSE 95 162* 81 109*  --  115*  BUN 18 46* 58* 24*  --  17  CREATININE 2.59* 4.21* 4.85* 2.87*  --  2.35*  CALCIUM 8.3* 8.2* 8.2* 7.9*  --  8.2*  MG  --   --   --  2.3  --   --   PHOS  --   --   --   --  3.0  --    Liver Function Tests: Recent Labs  Lab 10/06/18 1313 10/08/18 0731  AST 26 33  ALT 26 24  ALKPHOS 91 95  BILITOT 0.8 0.9  PROT 5.8* 5.8*  ALBUMIN 3.0* 2.9*   No results for input(s): LIPASE, AMYLASE in the last 168 hours. No results for input(s): AMMONIA in the last 168 hours. CBC: Recent Labs  Lab 10/06/18 1312 10/08/18 0731 10/09/18 0546 10/10/18 0644 10/11/18 0522  WBC 4.7 3.0* 1.5* 1.4* 3.2*  NEUTROABS 3.4 2.6 1.0*  --   --   HGB 10.0* 10.0* 9.2* 8.4* 9.4*  HCT 32.5* 32.2* 29.6* 26.3* 30.1*  MCV 106.2* 103.2* 105.0* 102.3* 104.9*  PLT 100* 147* 125* 127* 122*   BNP (last 3 results) Recent Labs    08/28/18 0943 08/28/18 1443 10/08/18 0731  BNP >4,500.0* >4,500.0* 1,214.0*      Recent Results (from the past 240 hour(s))  SARS Coronavirus 2 (CEPHEID- Performed in [place] hospital lab), Hosp Order     Status: None   Collection Time: 10/08/18  7:31 AM   Specimen: Nasopharyngeal Swab  Result Value Ref  Range Status   SARS Coronavirus 2 NEGATIVE NEGATIVE Final    Comment: (NOTE) If result is NEGATIVE SARS-CoV-2 target nucleic acids are NOT DETECTED. The SARS-CoV-2 RNA is generally detectable in upper and lower  respiratory specimens during the acute phase of infection. The lowest  concentration of SARS-CoV-2 viral copies this assay can detect is 250  copies / mL. A negative result does not preclude SARS-CoV-2 infection  and should not be used as the sole basis for treatment or other  patient management decisions.  A negative result may occur with  improper specimen collection / handling, submission of specimen other  than nasopharyngeal swab, presence of viral mutation(s) within the  areas targeted by this assay, and inadequate number of viral copies  (<250 copies / mL). A negative result must be combined with clinical  observations, patient history, and epidemiological information. If result is POSITIVE SARS-CoV-2 target nucleic acids are DETECTED. The SARS-CoV-2 RNA is generally detectable in upper and lower  respiratory specimens dur ing the acute phase of infection.  Positive  results are indicative of active infection with SARS-CoV-2.  Clinical  correlation with patient history and other diagnostic information is  necessary to determine patient infection status.  Positive results do  not rule out bacterial infection or co-infection with other viruses. If result is PRESUMPTIVE POSTIVE SARS-CoV-2 nucleic acids MAY BE PRESENT.   A presumptive positive result was obtained on the submitted specimen  and confirmed on repeat testing.  While 2019 novel coronavirus  (SARS-CoV-2) nucleic acids may be present in the submitted sample  additional confirmatory testing may be necessary for epidemiological  and / or clinical management purposes  to differentiate between  SARS-CoV-2 and other Sarbecovirus currently known to infect humans.  If clinically indicated additional testing with an  alternate test  methodology [phone]) is advised. The SARS-CoV-2 RNA is generally  detectable in upper and lower respiratory sp ecimens during the acute  phase of infection. The expected result is Negative. Fact Sheet for Patients:  StrictlyIdeas.no Fact Sheet for Healthcare Providers: BankingDealers.co.za This test is not yet approved or cleared by the [place] FDA and has been authorized for detection and/or diagnosis of SARS-CoV-2 by FDA under an Emergency Use Authorization (EUA).  This EUA will remain in effect (meaning this test can be used) for the duration of the COVID-19 declaration under Section 564(b)(1) of the Act, 21 U.S.C. section 360bbb-3(b)(1), unless the authorization is terminated or revoked sooner. Performed at [facility], [address] [place], [place] [address]   Blood Culture (routine x 2)  Status: None (Preliminary result)   Collection Time: 10/08/18  7:32 AM   Specimen: BLOOD  Result Value Ref Range Status   Specimen Description BLOOD RIGHT FA  Final   Special Requests   Final    BOTTLES DRAWN AEROBIC AND ANAEROBIC Blood Culture adequate volume   Culture   Final    NO GROWTH 4 DAYS Performed at [facility], [address] [place], [place] [address]    Report Status PENDING  Incomplete  Blood Culture (routine x 2)     Status: None (Preliminary result)   Collection Time: 10/08/18  7:32 AM   Specimen: BLOOD  Result Value Ref Range Status   Specimen Description BLOOD RIGHT HAND  Final   Special Requests   Final    BOTTLES DRAWN AEROBIC AND ANAEROBIC Blood Culture results may not be optimal due to an excessive volume of blood received in culture bottles   Culture   Final    NO GROWTH 4 DAYS Performed at [facility], [address] [place], [place] [address]    Report Status PENDING  Incomplete  Urine culture     Status: None   Collection Time: 10/08/18  8:35 AM    Specimen: Urine, Random  Result Value Ref Range Status   Specimen Description   Final    URINE, RANDOM Performed at [facility], [address] [place], [place] [address]    Special Requests   Final    NONE Performed at [facility], [name], [place], [place] [address]    Culture   Final    NO GROWTH Performed at [facility], [place] [address], [place], [place] [address]    Report Status 10/09/2018 FINAL  Final  C difficile quick scan w PCR reflex     Status: None   Collection Time: 10/09/18  4:10 AM   Specimen: STOOL  Result Value Ref Range Status   C Diff antigen NEGATIVE NEGATIVE Final   C Diff toxin NEGATIVE NEGATIVE Final   C Diff interpretation No C. difficile detected.  Final    Comment: Performed at [facility], [address] [place], [place] [address]  Body fluid culture     Status: None (Preliminary result)   Collection Time: 10/09/18  5:00 PM   Specimen: [facility] Cytology Pleural fluid  Result Value Ref Range Status   Specimen Description   Final    PLEURAL Performed at [facility], [address] [place], [place] [address]    Special Requests   Final    NONE Performed at [facility], [address] [place], [place] [address]    Gram Stain   Final    FEW WBC PRESENT,BOTH PMN AND MONONUCLEAR NO ORGANISMS SEEN    Culture   Final    NO GROWTH 3 DAYS Performed at [facility], [place] [address] [place], [place] [address]    Report Status PENDING  Incomplete     Studies: Dg Chest 2 View  Result Date: 10/10/2018 CLINICAL DATA:  Ovarian cancer on chemotherapy, hypertension, CHF, asthma, end-stage renal disease on dialysis, former smoker EXAM: CHEST - 2 VIEW COMPARISON:  10/09/2018 FINDINGS: RIGHT jugular Port-A-Cath with tip projecting over SVC. LEFT jugular dual-lumen central venous catheter with tip above cavoatrial junction. Enlargement of cardiac silhouette with pulmonary vascular  congestion. Stable mediastinal contours. Bibasilar effusions and mild LEFT basilar atelectasis. Minimal perihilar edema. No pneumothorax or acute osseous findings. IMPRESSION: Enlargement of  cardiac silhouette with pulmonary vascular congestion and mild pulmonary edema. Bibasilar effusions and LEFT LEFT basilar atelectasis. Electronically Signed   By: Lavonia Dana M.D.   On: 10/10/2018 17:28   Ct Chest Wo Contrast  Result Date: 10/12/2018 CLINICAL DATA:  Pleural effusion. Clinical sepsis and neutropenic fever. Status post thoracentesis. EXAM: CT CHEST WITHOUT CONTRAST TECHNIQUE: Multidetector CT imaging of the chest was performed following the standard protocol without IV contrast. COMPARISON:  Chest CT 08/09/2018 FINDINGS: Cardiovascular: The heart is enlarged but stable. No pericardial effusion. Stable tortuosity, ectasia and calcification of the thoracic aorta. Scattered coronary artery calcifications. Right-sided Port-A-Cath and left IJ dialysis catheter in good positions without complicating features. Mediastinum/Nodes: Stable scattered mediastinal and hilar lymph nodes without mass or overt adenopathy. The esophagus is grossly normal. Lungs/Pleura: Moderate-sized bilateral pleural effusions with overlying atelectasis. Patchy inflammatory changes in both lungs with areas of tree-in-bud appearance suggesting chronic inflammation or atypical infection such as MAC or viral pneumonitis. No worrisome pulmonary lesions. No endobronchial lesions are identified. Upper Abdomen: No significant upper abdominal findings. Musculoskeletal: No significant bony findings. No breast masses, supraclavicular or axillary adenopathy. IMPRESSION: 1. Moderate-sized bilateral pleural effusions with overlying atelectasis. 2. Patchy inflammatory or atypical infectious process in the lungs. No focal pneumonia. No worrisome pulmonary lesions. 3. Stable cardiac enlargement and tortuosity and calcification of the thoracic aorta. 4. Stable  mediastinal and hilar lymph nodes without mass or overt adenopathy. Aortic Atherosclerosis (ICD10-I70.0). Electronically Signed   By: Marijo Sanes M.D.   On: 10/12/2018 12:26    Scheduled Meds: . amiodarone  400 mg Oral BID   Followed by  . [START ON 10/17/2018] amiodarone  200 mg Oral BID  . amoxicillin-clavulanate  1 tablet Oral Daily  . Chlorhexidine Gluconate Cloth  6 each Topical Q0600  . Chlorhexidine Gluconate Cloth  6 each Topical Q0600  . DULoxetine  60 mg Oral Daily  . [START ON 10/13/2018] epoetin (EPOGEN/PROCRIT) injection  10,000 Units Intravenous Q T,Th,Sa-HD  . ferrous sulfate  325 mg Oral Daily  . furosemide  40 mg Intravenous Q12H  . hydrALAZINE  25 mg Oral TID  . ipratropium-albuterol  3 mL Nebulization Q6H  . isosorbide mononitrate  30 mg Oral Daily  . levothyroxine  25 mcg Oral QAC breakfast  . loratadine  10 mg Oral Daily  . magnesium oxide  400 mg Oral BID  . metoprolol succinate  100 mg Oral Daily  . sodium chloride flush  3 mL Intravenous Q12H  . vitamin B-12  1,000 mcg Oral Daily  . vitamin C  500 mg Oral Daily   Continuous Infusions: . sodium chloride 250 mL (10/10/18 2239)    Assessment/Plan:  1. Clinical sepsis and neutropenic fever.  No source of fever identified will change to oral Augmentin 2. Pleural effusion status post thoracentesis LDH is elevated possible malignant pleural effusion fluid cytology pending I called the pathology lab they state that it was just sent today patient has recurrent pleural effusion and symptomatic I will ask radiology to drain tomorrow 3. acute on chronic systolic congestive heart failure with shortness of breath.  Currently receiving dialysis continue Lasix IV  4. A. fib with RVR now on oral amiodarone Eliquis on hold for thoracentesis tomorrow 5. ovarian cancer undergoing chemotherapy.  Last chemo on Tuesday.  Pancytopenia secondary to chemotherapy.  Patient listed as a full code. 6. End-stage renal disease on  dialysis 7. Leg pain we will get an ultrasound of the lower extremity 8. Hypertension continue hydralazine  and metoprolol 9. Hypothyroidism unspecified on levothyroxine  Code Status:     Code Status Orders  (From admission, onward)         Start     Ordered   10/08/18 1047  Full code  Continuous     10/08/18 1049        Code Status History    Date Active Date Inactive Code Status Order ID Comments User Context   08/28/2018 1952 08/30/2018 1714 Full Code [id number]  Gorden Harms, MD Inpatient   08/06/2018 2355 08/18/2018 1650 Full Code [id number]  Mayer Camel, NP ED   Advance Care Planning Activity    Advance Directive Documentation     Most Recent Value  Type of Advance Directive  Healthcare Power of Attorney, Living will  Pre-existing out of facility DNR order (yellow form or pink MOST form)  -  "MOST" Form in Place?  -     Family Communication: Spoke with sister on the phone Disposition Plan: To be determined  Antibiotics:  Vancomycin  Cefepime  Time spent: 28 minutes  [place]

## 2018-10-12 NOTE — Progress Notes (Signed)
Progress Note  Patient Name: Jessica Burgess Date of Encounter: 10/12/2018  Primary Cardiologist: Nelva Bush, MD   Subjective   She denies chest pain, palpitations, or racing heart rate this morning.  She stated she is symptomatic when in atrial fibrillation but has no further symptoms now that back in sinus rhythm.    She has not yet been up to ambulate around the room.    She reported that she is back to her normal breathing status yet still on 3 L of oxygen while using 2 L at home.  She requested to attempt 2 L of oxygen at this time.  Inpatient Medications    Scheduled Meds: . apixaban  5 mg Oral BID  . Chlorhexidine Gluconate Cloth  6 each Topical Q0600  . Chlorhexidine Gluconate Cloth  6 each Topical Q0600  . diltiazem  60 mg Oral Q6H  . DULoxetine  60 mg Oral Daily  . [START ON 10/13/2018] epoetin (EPOGEN/PROCRIT) injection  10,000 Units Intravenous Q T,Th,Sa-HD  . ferrous sulfate  325 mg Oral Daily  . furosemide  40 mg Intravenous Q12H  . hydrALAZINE  25 mg Oral TID  . ipratropium-albuterol  3 mL Nebulization Q6H  . isosorbide mononitrate  30 mg Oral Daily  . levothyroxine  25 mcg Oral QAC breakfast  . loratadine  10 mg Oral Daily  . magnesium oxide  400 mg Oral BID  . metoprolol succinate  50 mg Oral Daily  . sodium chloride flush  3 mL Intravenous Q12H  . vitamin B-12  1,000 mcg Oral Daily  . vitamin C  500 mg Oral Daily   Continuous Infusions: . sodium chloride 250 mL (10/10/18 2239)  . amiodarone 30 mg/hr (10/12/18 0105)  . ceFEPime (MAXIPIME) IV 2 g (10/10/18 1159)  . vancomycin 750 mg (10/10/18 2245)   PRN Meds: sodium chloride, acetaminophen, cyclobenzaprine, hydrOXYzine, LORazepam, metoCLOPramide, metoprolol tartrate, ondansetron, oxyCODONE-acetaminophen **AND** oxyCODONE, polyethylene glycol, sodium chloride flush, sodium chloride flush, temazepam   Vital Signs    Vitals:   10/12/18 0051 10/12/18 0208 10/12/18 0345 10/12/18 0808  BP: (!)  132/91  (!) 153/82 138/72  Pulse: 69  65 64  Resp: 20  20 16   Temp: 98 F (36.7 C)  (!) 97.5 F (36.4 C) 97.7 F (36.5 C)  TempSrc: Oral  Oral Oral  SpO2: 100% 96% 100% 99%  Weight:   71.5 kg   Height:        Intake/Output Summary (Last 24 hours) at 10/12/2018 0841 Last data filed at 10/12/2018 0010 Gross per 24 hour  Intake 46.66 ml  Output 100 ml  Net -53.34 ml   Last 3 Weights 10/12/2018 10/11/2018 10/10/2018  Weight (lbs) 157 lb 9.6 oz 159 lb 12.8 oz 158 lb 6.4 oz  Weight (kg) 71.487 kg 72.485 kg 71.85 kg      Telemetry    Sinus rhythm, 70s- Personally Reviewed  ECG    No new tracings - Personally Reviewed  Physical Exam   GEN: No acute distress.   Neck: +HJR, JVP~9-10cm Cardiac: RRR, 2/6  holosystolic murmur, rubs, or gallops.  Respiratory: Bilateral wheezing, work with inspiration   GI: Soft, nontender, non-distended. Tender LUQ.  MS: 2+ pitting bilateral LEE; No deformity. Baseline tremor Neuro:  Baseline tremor  Psych: Normal affect   Labs    High Sensitivity Troponin:   Recent Labs  Lab 10/08/18 0731 10/09/18 1928  TROPONINIHS 37* 60*      Cardiac EnzymesNo results for input(s): TROPONINI in  the last 168 hours. No results for input(s): TROPIPOC in the last 168 hours.   Chemistry Recent Labs  Lab 10/06/18 1313 10/08/18 0731 10/09/18 0546 10/10/18 0644 10/11/18 0522  NA 138 130* 130* 132* 137  K 5.2* 5.0 5.8* 4.1 3.9  CL 97* 96* 93* 95* 98  CO2 32 24 23 28 29   GLUCOSE 95 162* 81 109* 115*  BUN 18 46* 58* 24* 17  CREATININE 2.59* 4.21* 4.85* 2.87* 2.35*  CALCIUM 8.3* 8.2* 8.2* 7.9* 8.2*  PROT 5.8* 5.8*  --   --   --   ALBUMIN 3.0* 2.9*  --   --   --   AST 26 33  --   --   --   ALT 26 24  --   --   --   ALKPHOS 91 95  --   --   --   BILITOT 0.8 0.9  --   --   --   GFRNONAA 19* 11* 9* 17* 21*  GFRAA 22* 12* 10* 19* 25*  ANIONGAP 9 10 14 9 10      Hematology Recent Labs  Lab 10/09/18 0546 10/10/18 0644 10/11/18 0522  WBC 1.5* 1.4*  3.2*  RBC 2.82* 2.57* 2.87*  HGB 9.2* 8.4* 9.4*  HCT 29.6* 26.3* 30.1*  MCV 105.0* 102.3* 104.9*  MCH 32.6 32.7 32.8  MCHC 31.1 31.9 31.2  RDW 15.6* 15.3 15.3  PLT 125* 127* 122*    BNP Recent Labs  Lab 10/08/18 0731  BNP 1,214.0*     DDimer No results for input(s): DDIMER in the last 168 hours.   Radiology    Dg Chest 2 View  Result Date: 10/10/2018 CLINICAL DATA:  Ovarian cancer on chemotherapy, hypertension, CHF, asthma, end-stage renal disease on dialysis, former smoker EXAM: CHEST - 2 VIEW COMPARISON:  10/09/2018 FINDINGS: RIGHT jugular Port-A-Cath with tip projecting over SVC. LEFT jugular dual-lumen central venous catheter with tip above cavoatrial junction. Enlargement of cardiac silhouette with pulmonary vascular congestion. Stable mediastinal contours. Bibasilar effusions and mild LEFT basilar atelectasis. Minimal perihilar edema. No pneumothorax or acute osseous findings. IMPRESSION: Enlargement of cardiac silhouette with pulmonary vascular congestion and mild pulmonary edema. Bibasilar effusions and LEFT LEFT basilar atelectasis. Electronically Signed   By: Lavonia Dana M.D.   On: 10/10/2018 17:28    Cardiac Studies   Echo  08/28/2018  1. The left ventricle has low normal systolic function, with an ejection fraction of 50-55%. The cavity size was normal. There is mildly increased left ventricular wall thickness.  2. The right ventricle has normal systolc function. The cavity was normal. There is no increase in right ventricular wall thickness. Right ventricular systolic pressure is severely elevated with an estimated pressure of 73.7 mmHg.  3. Trivial pericardial effusion is present.  4. Mitral valve regurgitation is moderate  5. Tricuspid valve regurgitation is moderate.  6. Aortic valve regurgitation is mild to moderate by color flow Doppler.  Patient Profile     63 y.o. 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  Assessment & Plan    Paroxysmal atrial fibrillation --Maintaining SR since converting on diltiazem infusion on 7/26 at 16:27.  No current symptoms of chest pain or tightness now that in SR. Does report fluttering and chest tightness when in Afib. --Will transition to oral amiodarone. Discontinued IV  amiodarone and started on po amiodarone 400 mg twice daily for 5 days then 200 mg twice daily thereafter. Follow-up CXR, eye exam, LFTs, TSH, and labs per guidelines while on amiodarone. --Increased Toprol to 100 mg daily for rate control. Discontinued diltiazem to consolidate rate control agents for now.  Rates currently well controlled in SR and 60s-70s. Continue to monitor vitals. --CHA2DS2VASc score of at least  4 (CHF, HTN, PAD, female) with recommendation for long term oral anticoagulation. Started on Eliquis 5mg  BID after discussion with nephrology/oncology/hematology over the weekend. Continue. --Will need to set up outpatient TCM follow-up.   Acute respiratory distress with hypoxia Severe Pulmonary HTN --Patient reporting back to baseline breathing status despite [place] oxygen still elevated from home oxygen (currently at 3L, home 2L). Patient requesting oxygen turned down to home 2L and this was communicated to IM. --Pleural infusion on CXR at admission and improving with diuresis and HD. Suspect volume overload multifactorial with severely elevated right heart pressures and elevated ventricular rate in PAF both contributing.  --Most recent echo as above showing severe pulmonary HTN with RVSP 73.7 mmHg. Severely elevated right heart pressures contributing to breathing status and volume overload. Normal EF 50-55%.   --Still volume overloaded on exam as above. Continue HD and diuresis. Strict I/O, daily weights. Daily BMET. --Continue medical management  with hydralazine 25mg  TID, IV lasix 40mg  q12h, Imdur 30mg  qd, and Toprol at increased dose of 100mg  daily. As above, discontinued diltiazem.  Outpatient follow-up as above within 1-2 weeks of discharge.   HTN --Increased Toprol and discontinued diltiazem. Continue remainder of medical management as above and titrate as HR allows for optimal BP support.  Anemia --Epogen per nephrology.  --Eliquis cleared by nephrology/hematology/oncology.  Hypothyroidism --Continue Synthroid. TSH 3.767. Per PCP and with monitoring while on amiodarone.   Neutropenic fevers Ovarian cancer on chemotherapy stage III --Per IM, hematology/oncology.   Remainder per IM   For questions or updates, please contact [place] Please consult [email or web address] for contact info under        Signed, Arvil Chaco, PA-C  10/12/2018, 8:41 AM

## 2018-10-12 NOTE — Progress Notes (Signed)
Patient requesting ativan be available every 6 hours instead of every 8. Ok to change per Dr. Posey Pronto.

## 2018-10-12 NOTE — TOC Initial Note (Signed)
Transition of Care [facility]) - Initial/Assessment Note    Patient Details  Name: Jessica Burgess MRN: [id number] Date of Birth: 08/05/1955  Transition of Care [facility]) CM/SW Contact:    Shade Flood, LCSW Phone Number: 10/12/2018, 1:39 PM  Clinical Narrative:                  Pt admitted from home. Pt is high risk for readmission. Met with pt today to assess. Per pt, she and her sister live together. Her sister assists her as needed. Pt is on chronic O2 at home. She states she has multiple walkers. She also reports that her bathroom is remodeled with all the safety bells and whistles.   Pt drives herself to outpt HD. She states that she does not have any difficulty obtaining prescription medications.  Pt declines [place] services at this time. She states she would like to see if she could get a free standing "trapeze" to help re-position in bed and also a wheelchair. She is agreeable to DME rep meeting with her to discuss. Will refer to [facility] with [employer] at pt request.  TOC will follow and continue to assess and assist with pt's dc planning needs.  Expected Discharge Plan: Home/Self Care Barriers to Discharge: Continued Medical Work up   Patient Goals and CMS Choice        Expected Discharge Plan and Services Expected Discharge Plan: Home/Self Care In-house Referral: Clinical Social Work       Expected Discharge Date: 10/11/18                                    Prior Living Arrangements/Services   Lives with:: Siblings Patient language and need for interpreter reviewed:: Yes Do you feel safe going back to the place where you live?: Yes      Need for Family Participation in Patient Care: Yes (Comment) Care giver support system in place?: Yes (comment) Current home services: DME Criminal Activity/Legal Involvement Pertinent to Current Situation/Hospitalization: No - Comment as needed  Activities of Daily Living Home Assistive Devices/Equipment: Oxygen ADL Screening  (condition at time of admission) Patient's cognitive ability adequate to safely complete daily activities?: Yes Is the patient deaf or have difficulty hearing?: No Does the patient have difficulty seeing, even when wearing glasses/contacts?: No Does the patient have difficulty concentrating, remembering, or making decisions?: No Patient able to express need for assistance with ADLs?: Yes Does the patient have difficulty dressing or bathing?: No Independently performs ADLs?: Yes (appropriate for developmental age) Does the patient have difficulty walking or climbing stairs?: No Weakness of Legs: None Weakness of Arms/Hands: None  Permission Sought/Granted                  Emotional Assessment Appearance:: Appears stated age Attitude/Demeanor/Rapport: Engaged Affect (typically observed): Pleasant Orientation: : Oriented to Self, Oriented to Place, Oriented to  Time, Oriented to Situation Alcohol / Substance Use: Not Applicable Psych Involvement: No (comment)  Admission diagnosis:  SOB (shortness of breath) [R06.02] Patient Active Problem List   Diagnosis Date Noted  . Sepsis ([place]) 10/08/2018  . CHF (congestive heart failure) ([place]) 08/28/2018  . Acute respiratory failure ([place])   . Anxiety 08/14/2018  . ESRD on dialysis ([place])   . Acute CHF (congestive heart failure) ([place]) 08/07/2018  . Acute CHF ([place]) 08/06/2018  . 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 Mucositis due to chemotherapy 09/02/2017  .  Goals of care, counseling/discussion 08/08/2017  . Hypothyroidism due to medication 07/28/2017  . Genetic testing 12/18/2016  . Migraines 12/01/2016  . Postoperative seroma of subcutaneous tissue after non-dermatologic procedure 12/01/2016  . Transaminitis 12/01/2016  . Anemia associated with acute blood loss 11/20/2016  . Ovarian cancer, unspecified laterality ([place]) 11/12/2016  . 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[place]) 11/05/2016  . Examination of participant in  clinical trial 11/01/2016  . Carcinomatosis ([place]) 11/01/2016  . Chronic right-sided low back pain with right-sided sciatica 02/02/2016  . Status post insertion of dialysis catheter ([place]) 08/01/2015  . Hematoma 06/14/2015  . Perennial allergic rhinitis 05/05/2015  . Generalized anxiety disorder 02/03/2015  . Back pain, chronic 08/25/2014  . Insomnia, persistent 08/25/2014  . Chronic cervical pain 08/25/2014  . Major depression, chronic ([place]) 08/25/2014  . Dyslipidemia 08/25/2014  . Gastro-esophageal reflux disease without esophagitis 08/25/2014  . H/O high risk medication treatment 08/25/2014  . Blood glucose elevated 08/25/2014  . Migraine without aura and without status migrainosus, not intractable 08/25/2014  . Climacteric 08/25/2014  . Dysmetabolic syndrome 89/37/3428  . Fungal infection of toenail 08/25/2014  . Obesity (BMI 30.0-34.9) 08/25/2014  . Vitamin D deficiency 08/25/2014  . Engages in travel abroad 08/25/2014  . Cervical radiculitis 07/23/2013  . Cervical disc disorder with radiculopathy 04/28/2013   PCP:  No primary care provider on file. Pharmacy:   [facility], [place] [facility] [place] [address] [place] [address] Phone: [phone] Fax: [phone]  [facility]) - [place], [place] [address] Phone: [phone] Fax: [phone]     Social Determinants of Health (SDOH) Interventions    Readmission Risk Interventions Readmission Risk Prevention Plan 10/12/2018 10/10/2018 08/30/2018  Transportation Screening Complete - Complete  [place] or [place] - - -  Medication Review ([place]) Complete - Complete  PCP or Specialist appointment within 3-5 days of discharge - - -  [place] or [place] Patient refused Complete Complete  SW Recovery Care/Counseling Consult Complete - -   SW Consult Not Complete Comments - - -  Palliative Care Screening Not Applicable Not Applicable -  [place] Not Applicable Not Applicable -  Some recent data might be hidden

## 2018-10-12 NOTE — Progress Notes (Signed)
Pharmacy Antibiotic Note  Jessica Burgess is a 63 y.o. female admitted on 10/08/2018 with sepsis.  Patient started on vancomycin + cefepime for empiric therapy. Of note patient has stage 3 ovarian cancer on chemotherapy (last session 7/21) and pancytopenia on CBC. Patient has ESRD and is on dialysis on a Tuesday, Thursday, Saturday schedule.   In ED patient received a LD of Vancomycin 1.5g x1 and cefepime 2g x1. Patient received a second dose of cefepime 2g on 7/23 despite not going for dialysis. Tentative plan is for dialysis today.   Plan: Cefepime 2g IV after each HD session  Vancomycin 750mg  IV after each HD session. Order Vancomycin random level for tomorrow.   Height: 5\' 2"  (157.5 cm) Weight: 157 lb 9.6 oz (71.5 kg) IBW/kg (Calculated) : 50.1  Temp (24hrs), Avg:97.9 F (36.6 C), Min:97.5 F (36.4 C), Max:98.4 F (36.9 C)  Recent Labs  Lab 10/06/18 1312 10/06/18 1313 10/08/18 0731 10/09/18 0546 10/10/18 0644 10/11/18 0522  WBC 4.7  --  3.0* 1.5* 1.4* 3.2*  CREATININE  --  2.59* 4.21* 4.85* 2.87* 2.35*  LATICACIDVEN  --   --  1.1  --   --   --     Estimated Creatinine Clearance: 22.7 mL/min (A) (by C-G formula based on SCr of 2.35 mg/dL (H)).    No Known Allergies  Antimicrobials this admission: Vancomycin 7/23 >>  Cefepime 7/23 >>   Thank you for allowing pharmacy to be a part of this patient's care.  Eleonore Chiquito, PharmD, BCPS 10/12/2018 9:25 AM

## 2018-10-12 NOTE — Progress Notes (Signed)
[facility]  ROUNDING NOTE   Subjective:   On phone with sister Jessica Burgess.   Amiodarone gtt  Asking if she can have left sided thoracentesis.   Started on apixaban yesterday.   Started on IV furosemide  Objective:  Vital signs in last 24 hours:  Temp:  [97.5 F (36.4 C)-98.4 F (36.9 C)] 98 F (36.7 C) (07/27 0946) Pulse Rate:  [64-116] 73 (07/27 0946) Resp:  [16-20] 16 (07/27 0808) BP: (127-156)/(70-100) 133/76 (07/27 0946) SpO2:  [96 %-100 %] 99 % (07/27 0808) Weight:  [71.5 kg] 71.5 kg (07/27 0345)  Weight change: 0.487 kg Filed Weights   10/10/18 2202 10/11/18 0414 10/12/18 0345  Weight: 71.8 kg 72.5 kg 71.5 kg    Intake/Output: I/O last 3 completed shifts: In: 311.2 [I.V.:61.2; IV Piggyback:250] Out: 352 [Urine:101; Other:251]   Intake/Output this shift:  Total I/O In: 240 [P.O.:240] Out: -   Physical Exam: General: No acute distress, laying in bed  Head: Normocephalic, atraumatic. Moist oral mucosal membranes  Eyes: Anicteric  Neck: Supple, trachea midline  Lungs:  Left sided diminished at base  Heart: irregular  Abdomen:  Soft, nontender, bowel sounds present  Extremities: + peripheral edema.  Neurologic: Awake, alert, following commands  Skin: No lesions  Access: Left IJ PermCath    Basic Metabolic Panel: Recent Labs  Lab 10/06/18 1313 10/08/18 0731 10/09/18 0546 10/10/18 0644 10/10/18 1750 10/11/18 0522  NA 138 130* 130* 132*  --  137  K 5.2* 5.0 5.8* 4.1  --  3.9  CL 97* 96* 93* 95*  --  98  CO2 32 24 23 28   --  29  GLUCOSE 95 162* 81 109*  --  115*  BUN 18 46* 58* 24*  --  17  CREATININE 2.59* 4.21* 4.85* 2.87*  --  2.35*  CALCIUM 8.3* 8.2* 8.2* 7.9*  --  8.2*  MG  --   --   --  2.3  --   --   PHOS  --   --   --   --  3.0  --     Liver Function Tests: Recent Labs  Lab 10/06/18 1313 10/08/18 0731  AST 26 33  ALT 26 24  ALKPHOS 91 95  BILITOT 0.8 0.9  PROT 5.8* 5.8*  ALBUMIN 3.0* 2.9*   No results for  input(s): LIPASE, AMYLASE in the last 168 hours. No results for input(s): AMMONIA in the last 168 hours.  CBC: Recent Labs  Lab 10/06/18 1312 10/08/18 0731 10/09/18 0546 10/10/18 0644 10/11/18 0522  WBC 4.7 3.0* 1.5* 1.4* 3.2*  NEUTROABS 3.4 2.6 1.0*  --   --   HGB 10.0* 10.0* 9.2* 8.4* 9.4*  HCT 32.5* 32.2* 29.6* 26.3* 30.1*  MCV 106.2* 103.2* 105.0* 102.3* 104.9*  PLT 100* 147* 125* 127* 122*    Cardiac Enzymes: No results for input(s): CKTOTAL, CKMB, CKMBINDEX, TROPONINI in the last 168 hours.  BNP: Invalid input(s): POCBNP  CBG: No results for input(s): GLUCAP in the last 168 hours.  Microbiology: Results for orders placed or performed during the hospital encounter of 10/08/18  SARS Coronavirus 2 (CEPHEID- Performed in [place] hospital lab), Hosp Order     Status: None   Collection Time: 10/08/18  7:31 AM   Specimen: Nasopharyngeal Swab  Result Value Ref Range Status   SARS Coronavirus 2 NEGATIVE NEGATIVE Final    Comment: (NOTE) If result is NEGATIVE SARS-CoV-2 target nucleic acids are NOT DETECTED. The SARS-CoV-2 RNA is generally detectable  in upper and lower  respiratory specimens during the acute phase of infection. The lowest  concentration of SARS-CoV-2 viral copies this assay can detect is 250  copies / mL. A negative result does not preclude SARS-CoV-2 infection  and should not be used as the sole basis for treatment or other  patient management decisions.  A negative result may occur with  improper specimen collection / handling, submission of specimen other  than nasopharyngeal swab, presence of viral mutation(s) within the  areas targeted by this assay, and inadequate number of viral copies  (<250 copies / mL). A negative result must be combined with clinical  observations, patient history, and epidemiological information. If result is POSITIVE SARS-CoV-2 target nucleic acids are DETECTED. The SARS-CoV-2 RNA is generally detectable in upper and  lower  respiratory specimens dur ing the acute phase of infection.  Positive  results are indicative of active infection with SARS-CoV-2.  Clinical  correlation with patient history and other diagnostic information is  necessary to determine patient infection status.  Positive results do  not rule out bacterial infection or co-infection with other viruses. If result is PRESUMPTIVE POSTIVE SARS-CoV-2 nucleic acids MAY BE PRESENT.   A presumptive positive result was obtained on the submitted specimen  and confirmed on repeat testing.  While 2019 novel coronavirus  (SARS-CoV-2) nucleic acids may be present in the submitted sample  additional confirmatory testing may be necessary for epidemiological  and / or clinical management purposes  to differentiate between  SARS-CoV-2 and other Sarbecovirus currently known to infect humans.  If clinically indicated additional testing with an alternate test  methodology [phone]) is advised. The SARS-CoV-2 RNA is generally  detectable in upper and lower respiratory sp ecimens during the acute  phase of infection. The expected result is Negative. Fact Sheet for Patients:  StrictlyIdeas.no Fact Sheet for Healthcare Providers: BankingDealers.co.za This test is not yet approved or cleared by the [place] FDA and has been authorized for detection and/or diagnosis of SARS-CoV-2 by FDA under an Emergency Use Authorization (EUA).  This EUA will remain in effect (meaning this test can be used) for the duration of the COVID-19 declaration under Section 564(b)(1) of the Act, 21 U.S.C. section 360bbb-3(b)(1), unless the authorization is terminated or revoked sooner. Performed at [facility], [address] [place], [place] [address]   Blood Culture (routine x 2)     Status: None (Preliminary result)   Collection Time: 10/08/18  7:32 AM   Specimen: BLOOD  Result Value Ref Range Status    Specimen Description BLOOD RIGHT FA  Final   Special Requests   Final    BOTTLES DRAWN AEROBIC AND ANAEROBIC Blood Culture adequate volume   Culture   Final    NO GROWTH 4 DAYS Performed at [facility], [address] [place], [place] [address]    Report Status PENDING  Incomplete  Blood Culture (routine x 2)     Status: None (Preliminary result)   Collection Time: 10/08/18  7:32 AM   Specimen: BLOOD  Result Value Ref Range Status   Specimen Description BLOOD RIGHT HAND  Final   Special Requests   Final    BOTTLES DRAWN AEROBIC AND ANAEROBIC Blood Culture results may not be optimal due to an excessive volume of blood received in culture bottles   Culture   Final    NO GROWTH 4 DAYS Performed at [facility], [address], [place], [place] [address]    Report Status  PENDING  Incomplete  Urine culture     Status: None   Collection Time: 10/08/18  8:35 AM   Specimen: Urine, Random  Result Value Ref Range Status   Specimen Description   Final    URINE, RANDOM Performed at [facility], [address] [place], [place] [address]    Special Requests   Final    NONE Performed at [facility], [address] [place], [place] [address]    Culture   Final    NO GROWTH Performed at [facility], [place] [address] [place], [place] [address]    Report Status 10/09/2018 FINAL  Final  C difficile quick scan w PCR reflex     Status: None   Collection Time: 10/09/18  4:10 AM   Specimen: STOOL  Result Value Ref Range Status   C Diff antigen NEGATIVE NEGATIVE Final   C Diff toxin NEGATIVE NEGATIVE Final   C Diff interpretation No C. difficile detected.  Final    Comment: Performed at [facility], [address] [place], [place] [address]  Body fluid culture     Status: None (Preliminary result)   Collection Time: 10/09/18  5:00 PM   Specimen: [facility] Cytology Pleural fluid  Result Value Ref Range Status   Specimen Description    Final    PLEURAL Performed at [facility], [address], [place], [place] [address]    Special Requests   Final    NONE Performed at [facility], [address] [place], [place] [address]    Gram Stain   Final    FEW WBC PRESENT,BOTH PMN AND MONONUCLEAR NO ORGANISMS SEEN    Culture   Final    NO GROWTH 3 DAYS Performed at [facility], [place] [address] [place], [place] [address]    Report Status PENDING  Incomplete    Coagulation Studies: No results for input(s): LABPROT, INR in the last 72 hours.  Urinalysis: No results for input(s): COLORURINE, LABSPEC, PHURINE, GLUCOSEU, HGBUR, BILIRUBINUR, KETONESUR, PROTEINUR, UROBILINOGEN, NITRITE, LEUKOCYTESUR in the last 72 hours.  Invalid input(s): APPERANCEUR    Imaging: Dg Chest 2 View  Result Date: 10/10/2018 CLINICAL DATA:  Ovarian cancer on chemotherapy, hypertension, CHF, asthma, end-stage renal disease on dialysis, former smoker EXAM: CHEST - 2 VIEW COMPARISON:  10/09/2018 FINDINGS: RIGHT jugular Port-A-Cath with tip projecting over SVC. LEFT jugular dual-lumen central venous catheter with tip above cavoatrial junction. Enlargement of cardiac silhouette with pulmonary vascular congestion. Stable mediastinal contours. Bibasilar effusions and mild LEFT basilar atelectasis. Minimal perihilar edema. No pneumothorax or acute osseous findings. IMPRESSION: Enlargement of cardiac silhouette with pulmonary vascular congestion and mild pulmonary edema. Bibasilar effusions and LEFT LEFT basilar atelectasis. Electronically Signed   By: Lavonia Dana M.D.   On: 10/10/2018 17:28     Medications:   . sodium chloride 250 mL (10/10/18 2239)  . ceFEPime (MAXIPIME) IV 2 g (10/10/18 1159)  . vancomycin 750 mg (10/10/18 2245)   . amiodarone  400 mg Oral BID   Followed by  . [START ON 10/17/2018] amiodarone  200 mg Oral BID  . apixaban  5 mg Oral BID  . Chlorhexidine Gluconate Cloth  6 each Topical Q0600  .  Chlorhexidine Gluconate Cloth  6 each Topical Q0600  . DULoxetine  60 mg Oral Daily  . [START ON 10/13/2018] epoetin (EPOGEN/PROCRIT) injection  10,000 Units Intravenous Q T,Th,Sa-HD  . ferrous sulfate  325 mg Oral Daily  .  furosemide  40 mg Intravenous Q12H  . hydrALAZINE  25 mg Oral TID  . ipratropium-albuterol  3 mL Nebulization Q6H  . isosorbide mononitrate  30 mg Oral Daily  . levothyroxine  25 mcg Oral QAC breakfast  . loratadine  10 mg Oral Daily  . magnesium oxide  400 mg Oral BID  . metoprolol succinate  100 mg Oral Daily  . sodium chloride flush  3 mL Intravenous Q12H  . vitamin B-12  1,000 mcg Oral Daily  . vitamin C  500 mg Oral Daily   sodium chloride, acetaminophen, cyclobenzaprine, hydrOXYzine, LORazepam, metoCLOPramide, metoprolol tartrate, ondansetron, oxyCODONE-acetaminophen **AND** oxyCODONE, polyethylene glycol, sodium chloride flush, sodium chloride flush, temazepam  Assessment/ Plan:  Jessica Burgess is a 62 y.o. white female with end stage renal disease on hemodialysis, ovarian cancer on chemotherapy, GERD, migraine headaches, cervical spinal stenosis, who was admitted for shortness of breath and fever. Right thoracentesis on 7/24 yielding 781mL.   [facility] TTS 70kg Permcath  1.  ESRD on hemodialysis - hemodialysis treatment for tomorrow. TTS schedule  2. Anemia with chronic kidney disease: and iron deficiency. Macrocytic. - IV venofer with outpatient dialysis treatment.  - EPO with HD treatment. Cleared by oncology.   3. Fever of unknown origin. Neutropenic.   - Empiric cefepime and vancomycin  4.  Secondary hyperparathyroidism.  Currently not on binders. Outpatient labs: PTH 267, phosphorus 5.8, calcium 8.7  5. Pleural effusion: status post right sided thoracentesis - left as per primary team.   6. Hypertension with new onset atrial fibrillation: on amiodarone gtt. 133/76. - metoprolol for rate control - apixaban for  anticoagulation - IV furosemide for pulmonary edema.  - home regimen includes: hydralazine and isosorbide mononitrate   LOS: 4 Keily Lepp 7/27/202010:47 AM

## 2018-10-12 NOTE — Plan of Care (Signed)
Patient has rested quietly today. PRNs useful for chronic back pain and anxiety. No other complaints today. Amio gtt transitioned to PO amio today - Sinus Rhythm 70's.

## 2018-10-12 NOTE — Care Management Important Message (Signed)
Important Message  Patient Details  Name: Jessica Burgess MRN: [id number] Date of Birth: [birth year]   Medicare Important Message Given:  Yes     Dannette Barbara 10/12/2018, 12:08 PM

## 2018-10-13 ENCOUNTER — Inpatient Hospital Stay: Payer: PPO

## 2018-10-13 ENCOUNTER — Inpatient Hospital Stay: Payer: PPO | Admitting: Oncology

## 2018-10-13 DIAGNOSIS — I5033 Acute on chronic diastolic (congestive) heart failure: Secondary | ICD-10-CM

## 2018-10-13 LAB — ALBUMIN, PLEURAL OR PERITONEAL FLUID: Albumin, Fluid: 1 g/dL

## 2018-10-13 LAB — RENAL FUNCTION PANEL
Albumin: 2.3 g/dL — ABNORMAL LOW (ref 3.5–5.0)
Anion gap: 10 (ref 5–15)
BUN: 28 mg/dL — ABNORMAL HIGH (ref 8–23)
CO2: 29 mmol/L (ref 22–32)
Calcium: 8.6 mg/dL — ABNORMAL LOW (ref 8.9–10.3)
Chloride: 95 mmol/L — ABNORMAL LOW (ref 98–111)
Creatinine, Ser: 3.94 mg/dL — ABNORMAL HIGH (ref 0.44–1.00)
GFR calc Af Amer: 13 mL/min — ABNORMAL LOW (ref >60)
GFR calc non Af Amer: 11 mL/min — ABNORMAL LOW (ref >60)
Glucose, Bld: 103 mg/dL — ABNORMAL HIGH (ref 70–99)
Phosphorus: 3.1 mg/dL (ref 2.5–4.6)
Potassium: 4.3 mmol/L (ref 3.5–5.1)
Sodium: 134 mmol/L — ABNORMAL LOW (ref 135–145)

## 2018-10-13 LAB — CBC WITH DIFFERENTIAL/PLATELET
Abs Immature Granulocytes: 0.04 10*3/uL (ref 0.00–0.07)
Basophils Absolute: 0 10*3/uL (ref 0.0–0.1)
Basophils Relative: 0 %
Eosinophils Absolute: 0.1 10*3/uL (ref 0.0–0.5)
Eosinophils Relative: 4 %
HCT: 32.6 % — ABNORMAL LOW (ref 36.0–46.0)
Hemoglobin: 10.2 g/dL — ABNORMAL LOW (ref 12.0–15.0)
Immature Granulocytes: 1 %
Lymphocytes Relative: 27 %
Lymphs Abs: 1.1 10*3/uL (ref 0.7–4.0)
MCH: 32.3 pg (ref 26.0–34.0)
MCHC: 31.3 g/dL (ref 30.0–36.0)
MCV: 103.2 fL — ABNORMAL HIGH (ref 80.0–100.0)
Monocytes Absolute: 0.6 10*3/uL (ref 0.1–1.0)
Monocytes Relative: 16 %
Neutro Abs: 2.1 10*3/uL (ref 1.7–7.7)
Neutrophils Relative %: 52 %
Platelets: 118 10*3/uL — ABNORMAL LOW (ref 150–400)
RBC: 3.16 MIL/uL — ABNORMAL LOW (ref 3.87–5.11)
RDW: 15 % (ref 11.5–15.5)
WBC: 4 10*3/uL (ref 4.0–10.5)
nRBC: 0 % (ref 0.0–0.2)

## 2018-10-13 LAB — BODY FLUID CULTURE: Culture: NO GROWTH

## 2018-10-13 LAB — CBC
HCT: 30.6 % — ABNORMAL LOW (ref 36.0–46.0)
Hemoglobin: 9.4 g/dL — ABNORMAL LOW (ref 12.0–15.0)
MCH: 32.5 pg (ref 26.0–34.0)
MCHC: 30.7 g/dL (ref 30.0–36.0)
MCV: 105.9 fL — ABNORMAL HIGH (ref 80.0–100.0)
Platelets: 107 10*3/uL — ABNORMAL LOW (ref 150–400)
RBC: 2.89 MIL/uL — ABNORMAL LOW (ref 3.87–5.11)
RDW: 15.3 % (ref 11.5–15.5)
WBC: 4.7 10*3/uL (ref 4.0–10.5)
nRBC: 0 % (ref 0.0–0.2)

## 2018-10-13 LAB — CULTURE, BLOOD (ROUTINE X 2)
Culture: NO GROWTH
Culture: NO GROWTH
Special Requests: ADEQUATE

## 2018-10-13 LAB — VANCOMYCIN, RANDOM: Vancomycin Rm: 17

## 2018-10-13 MED ORDER — APIXABAN 5 MG PO TABS
5.0000 mg | ORAL_TABLET | Freq: Two times a day (BID) | ORAL | Status: DC
  Administered 2018-10-13 – 2018-10-14 (×2): 5 mg via ORAL
  Filled 2018-10-13 (×2): qty 1

## 2018-10-13 MED ORDER — NEPRO/CARBSTEADY PO LIQD
237.0000 mL | Freq: Two times a day (BID) | ORAL | Status: DC
  Administered 2018-10-14: 237 mL via ORAL

## 2018-10-13 MED ORDER — RENA-VITE PO TABS
1.0000 | ORAL_TABLET | Freq: Every day | ORAL | Status: DC
  Administered 2018-10-13: 1 via ORAL
  Filled 2018-10-13 (×2): qty 1

## 2018-10-13 NOTE — Plan of Care (Signed)
  Problem: Clinical Measurements: Goal: Respiratory complications will improve Outcome: Progressing   Problem: Activity: Goal: Risk for activity intolerance will decrease Outcome: Progressing   

## 2018-10-13 NOTE — Progress Notes (Signed)
Progress Note  Patient Name: Jessica Burgess Date of Encounter: 10/13/2018  Primary Cardiologist: Nelva Bush, MD   Subjective   Seen during HD today.  Improved SOB. No CP, racing HR, SOB.  LUQ pain reported. Has not had a bowel movement since last Friday.  Eager to be seen by our office as an outpatient.  Inpatient Medications    Scheduled Meds:  amiodarone  400 mg Oral BID   Followed by   Derrill Memo ON 10/17/2018] amiodarone  200 mg Oral BID   amoxicillin-clavulanate  1 tablet Oral Daily   Chlorhexidine Gluconate Cloth  6 each Topical Q0600   Chlorhexidine Gluconate Cloth  6 each Topical Q0600   DULoxetine  60 mg Oral Daily   epoetin (EPOGEN/PROCRIT) injection  10,000 Units Intravenous Q T,Th,Sa-HD   feeding supplement (NEPRO CARB STEADY)  237 mL Oral BID BM   ferrous sulfate  325 mg Oral Daily   furosemide  40 mg Intravenous Q12H   hydrALAZINE  25 mg Oral TID   ipratropium-albuterol  3 mL Nebulization Q6H   isosorbide mononitrate  30 mg Oral Daily   levothyroxine  25 mcg Oral QAC breakfast   loratadine  10 mg Oral Daily   magnesium oxide  400 mg Oral BID   metoprolol succinate  100 mg Oral Daily   multivitamin  1 tablet Oral QHS   sodium chloride flush  3 mL Intravenous Q12H   vitamin B-12  1,000 mcg Oral Daily   vitamin C  500 mg Oral Daily   Continuous Infusions:  sodium chloride 250 mL (10/10/18 2239)   PRN Meds: sodium chloride, acetaminophen, cyclobenzaprine, hydrOXYzine, LORazepam, metoCLOPramide, metoprolol tartrate, ondansetron, oxyCODONE-acetaminophen **AND** oxyCODONE, polyethylene glycol, sodium chloride flush, sodium chloride flush, temazepam   Vital Signs    Vitals:   10/13/18 1015 10/13/18 1030 10/13/18 1045 10/13/18 1100  BP: (!) 154/89 (!) 176/90 (!) 162/97 (!) 169/96  Pulse: 70 71 70 71  Resp: _0 Temp:      TempSrc:      SpO2: 100% 100% 100% 100%  Weight:      Height:        Intake/Output Summary  (Last 24 hours) at 10/13/2018 1200 Last data filed at 10/13/2018 9211 Gross per 24 hour  Intake 120 ml  Output 300 ml  Net -180 ml   Last 3 Weights 10/13/2018 10/12/2018 10/11/2018  Weight (lbs) 155 lb 3.2 oz 157 lb 9.6 oz 159 lb 12.8 oz  Weight (kg) 70.398 kg 71.487 kg 72.485 kg      Telemetry    Sinus rhythm, 70s during HD- Personally Reviewed  ECG    No new tracings - Personally Reviewed  Physical Exam   GEN: No acute distress.  Seen during HD. Neck: JVP 10cm Cardiac: RRR, 2/6  holosystolic murmur, rubs, or gallops.  Respiratory: Bilateral wheezing, L base crackles heard on exam GI: Soft, mildly distended. Still tender LUQ.  MS: 1-2+ pitting bilateral LEE though improved from yesterday; No deformity. Baseline tremor Neuro:  Baseline tremor  Psych: Normal affect   Labs    High Sensitivity Troponin:   Recent Labs  Lab 10/08/18 0731 10/09/18 1928  TROPONINIHS 37* 60*      Cardiac EnzymesNo results for input(s): TROPONINI in the last 168 hours. No results for input(s): TROPIPOC in the last 168 hours.   Chemistry Recent Labs  Lab 10/06/18 1313 10/08/18 0731  10/10/18 0644 10/11/18 0522 10/13/18 0003  NA 138 130*   < >  132* 137 134*  K 5.2* 5.0   < > 4.1 3.9 4.3  CL 97* 96*   < > 95* 98 95*  CO2 32 24   < > _0 GLUCOSE 95 162*   < > 109* 115* 103*  BUN 18 46*   < > 24* 17 28*  CREATININE 2.59* 4.21*   < > 2.87* 2.35* 3.94*  CALCIUM 8.3* 8.2*   < > 7.9* 8.2* 8.6*  PROT 5.8* 5.8*  --   --   --   --   ALBUMIN 3.0* 2.9*  --   --   --  2.3*  AST 26 33  --   --   --   --   ALT 26 24  --   --   --   --   ALKPHOS 91 95  --   --   --   --   BILITOT 0.8 0.9  --   --   --   --   GFRNONAA 19* 11*   < > 17* 21* 11*  GFRAA 22* 12*   < > 19* 25* 13*  ANIONGAP 9 10   < > _1 < > = values in this interval not displayed.     Hematology Recent Labs  Lab 10/11/18 0522 10/13/18 0003 10/13/18 0420  WBC 3.2* 4.7 4.0  RBC 2.87* 2.89* 3.16*  HGB 9.4* 9.4*  10.2*  HCT 30.1* 30.6* 32.6*  MCV 104.9* 105.9* 103.2*  MCH 32.8 32.5 32.3  MCHC 31.2 30.7 31.3  RDW 15.3 15.3 15.0  PLT 122* 107* 118*    BNP Recent Labs  Lab 10/08/18 0731  BNP 1,214.0*     DDimer No results for input(s): DDIMER in the last 168 hours.   Radiology    Ct Chest Wo Contrast  Result Date: 10/12/2018 CLINICAL DATA:  Pleural effusion. Clinical sepsis and neutropenic fever. Status post thoracentesis. EXAM: CT CHEST WITHOUT CONTRAST TECHNIQUE: Multidetector CT imaging of the chest was performed following the standard protocol without IV contrast. COMPARISON:  Chest CT 08/09/2018 FINDINGS: Cardiovascular: The heart is enlarged but stable. No pericardial effusion. Stable tortuosity, ectasia and calcification of the thoracic aorta. Scattered coronary artery calcifications. Right-sided Port-A-Cath and left IJ dialysis catheter in good positions without complicating features. Mediastinum/Nodes: Stable scattered mediastinal and hilar lymph nodes without mass or overt adenopathy. The esophagus is grossly normal. Lungs/Pleura: Moderate-sized bilateral pleural effusions with overlying atelectasis. Patchy inflammatory changes in both lungs with areas of tree-in-bud appearance suggesting chronic inflammation or atypical infection such as MAC or viral pneumonitis. No worrisome pulmonary lesions. No endobronchial lesions are identified. Upper Abdomen: No significant upper abdominal findings. Musculoskeletal: No significant bony findings. No breast masses, supraclavicular or axillary adenopathy. IMPRESSION: 1. Moderate-sized bilateral pleural effusions with overlying atelectasis. 2. Patchy inflammatory or atypical infectious process in the lungs. No focal pneumonia. No worrisome pulmonary lesions. 3. Stable cardiac enlargement and tortuosity and calcification of the thoracic aorta. 4. Stable mediastinal and hilar lymph nodes without mass or overt adenopathy. Aortic Atherosclerosis (ICD10-I70.0).  Electronically Signed   By: Marijo Sanes M.D.   On: 10/12/2018 12:26    Cardiac Studies   Echo  08/28/2018  1. The left ventricle has low normal systolic function, with an ejection fraction of 50-55%. The cavity size was normal. There is mildly increased left ventricular wall thickness.  2. The right ventricle has normal systolc function. The cavity was normal. There is no increase in right  ventricular wall thickness. Right ventricular systolic pressure is severely elevated with an estimated pressure of 73.7 mmHg.  3. Trivial pericardial effusion is present.  4. Mitral valve regurgitation is moderate  5. Tricuspid valve regurgitation is moderate.  6. Aortic valve regurgitation is mild to moderate by color flow Doppler.  Patient Profile     63 y.o. 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  Assessment & Plan    Paroxysmal atrial fibrillation --Maintaining SR since converting on amiodarone infusion on 7/26 at 16:27.  No current symptoms of chest pain or tightness now that in SR. --Transition to oral amiodarone. Follow-up CXR, eye exam, LFTs, TSH, and labs per guidelines while on amiodarone. --Continue Toprol to 100 mg daily for rate control. Rates controlled. --CHA2DS2VASc score of at least  4 (CHF, HTN, PAD, female) with recommendation for long term oral anticoagulation. Started on Eliquis 41m BID after discussion with nephrology/ oncology/ hematology over the weekend.  --Will need to set up outpatient TCM follow-up at discharge.   Acute respiratory distress with hypoxia Severe Pulmonary HTN --Improved breathing status, improving volume status. --Bibasilar effusions on CXR at admission and improving with diuresis and HD. CT performed yesterday and showed moderate bilateral effusions with  overlying atelectasis, patchy inflammatory or atypical infectious process.  --Suspect volume overload multifactorial with severely elevated right heart pressures and elevated ventricular rate in PAF.  --Most recent echo as above showing severe pulmonary HTN with RVSP 73.7 mmHg. Severely elevated right heart pressures contributing to breathing status and volume overload. Normal EF 50-55%.   --Wt 147lbs  159lbs  155lbs. --Still volume overloaded on exam as above. Continue HD and diuresis. Strict I/O, daily weights. Daily BMET. Continue medical management with hydralazine 250mTID, IV lasix 4028m12h, Imdur 31m62m, and Toprol at increased dose of 100mg41mly.   HTN --Elevated this AM before HD. HD and medical management as above. Titrate antihypertensives as needed if continued elevated BP after HD.   Anemia --Epogen per nephrology.  --Eliquis cleared by nephrology/hematology/oncology.  Hypothyroidism --Continue Synthroid. TSH 3.767. Per PCP and with monitoring while on amiodarone.   Neutropenic fevers Ovarian cancer on chemotherapy stage III --Per IM, hematology/oncology.   Remainder per IM   For questions or updates, please contact [facility] consult [email or web address] for contact info under        Signed, JacquArvil ChacoC  10/13/2018, 12:00 PM

## 2018-10-13 NOTE — Progress Notes (Signed)
Hemodialysis treatment completed at 1205. Net UF removed 2.5 L Blood rinsed back, CVC flushed and locked with NS . Red caps replaced. Dressing remains intact and insertion site remains without signs and symptoms of infection.  Post hemodialysis report given to Raechel Ache, RN.    10/13/18 1205  Hand-Off documentation  Report given to (Full Name) Raechel Ache, RN  Vital Signs  Temp (!) 97.5 F (36.4 C)  Temp Source Axillary  Resp 20  BP (!) 164/95  BP Location Left Arm  BP Method Automatic  Oxygen Therapy  SpO2 100 %  During Hemodialysis Assessment  Dialysis Fluid Bolus Normal Saline  Bolus Amount (mL) 250 mL (rinseback)  Intra-Hemodialysis Comments Tx completed  Post-Hemodialysis Assessment  Rinseback Volume (mL) 250 mL  KECN 58.1 V  Dialyzer Clearance Lightly streaked  Duration of HD Treatment -hour(s) 3 hour(s)  Hemodialysis Intake (mL) 500 mL  UF Total -Machine (mL) 3000 mL  Net UF (mL) 2500 mL  Tolerated HD Treatment Yes  Education / Care Plan  Dialysis Education Provided Yes  Documented Education in Care Plan Yes  Outpatient Plan of Care Reviewed and on Chart Yes  Hemodialysis Catheter Left Subclavian Double-lumen  Placement Date/Time: 08/17/18 0800   Placed prior to admission: No  Person Inserting Catheter: Dr. Lucky Cowboy  Orientation: Left  Access Location: Subclavian  Hemodialysis Catheter Type: Double-lumen  Site Condition No complications  Blue Lumen Status Saline locked;Capped (Central line)  Red Lumen Status Saline locked;Capped (Central line)  Dressing Type Biopatch;Occlusive  Dressing Status Clean;Dry;Intact

## 2018-10-13 NOTE — Progress Notes (Signed)
Pre hemodialysis report received from Raechel Ache, RN. Received patient in acute room  via bed. Awake, alert and verbally responsive. No acute distress noted. CVC without signs and symptoms of infection.  Dressing in place and uncompromised. Accessed CVC per policy and found patent on each side. Hemodialysis treatment initiated at [facility] for 3  hours treatment with UF of 2.5 as tolerated.    10/13/18 1610  Hand-Off documentation  Report received from (Full Name) Levert Feinstein, RN  Vital Signs  Pulse Rate 71  Resp 20  BP (!) 156/73  BP Location Left Arm  BP Method Automatic  Patient Position (if appropriate) Lying  Oxygen Therapy  SpO2 99 %  O2 Device Nasal Cannula  O2 Flow Rate (L/min) 2 L/min  Pain Assessment  Pain Scale 0-10  Pain Score 0  Time-Out for Hemodialysis  What Procedure? hemodialysis  Pt Identifiers(min of two) First/Last Name;MRN/Account#  Correct Site? Yes  Correct Side? Yes  Correct Procedure? Yes  Consents Verified? Yes  Rad Studies Available? N/A  Safety Precautions Reviewed? Yes  Engineer, civil (consulting) Number 7  Station Number 2  UF/Alarm Test Passed  Conductivity: Meter 13.9  Conductivity: Machine  14  pH 7.4  Normal Saline Lot Number [id number]  Dialyzer Lot Number [id number]  Disposable Set Lot Number [id number]  Machine Temperature 98.6 F (37 C)  Musician and Audible Yes  Blood Lines Intact and Secured Yes  Pre Treatment Patient Checks  Vascular access used during treatment Catheter  HD catheter dressing before treatment Peeling edges  Hepatitis B Surface Antigen Results Negative  Date Hepatitis B Surface Antigen Drawn 08/10/18  Hepatitis B Surface Antibody  (immune)  Date Hepatitis B Surface Antibody Drawn 08/10/18  Hemodialysis Consent Verified Yes  Hemodialysis Standing Orders Initiated Yes  ECG (Telemetry) Monitor On Yes  Prime Ordered Normal Saline  Length of  DialysisTreatment -hour(s) 3 Hour(s)  Dialyzer Elisio 17H NR   Dialysate 2K;2.5 Ca  Dialysate Flow Ordered 600  Blood Flow Rate Ordered 400 mL/min  Ultrafiltration Goal 2500 Liters  During Hemodialysis Assessment  Blood Flow Rate (mL/min) 350 mL/min (highest BFR achievable)  Arterial Pressure (mmHg) -180 mmHg  Venous Pressure (mmHg) 130 mmHg  Transmembrane Pressure (mmHg) 70 mmHg  Ultrafiltration Rate (mL/min) 1000 mL/min  Dialysate Flow Rate (mL/min) 600 ml/min  Conductivity: Machine  13.8  HD Safety Checks Performed Yes  Dialysis Fluid Bolus Normal Saline  Bolus Amount (mL) 250 mL (prime)  Intra-Hemodialysis Comments Tx initiated

## 2018-10-13 NOTE — Procedures (Signed)
Pre procedural Dx: Symptomatic Pleural effusion Post procedural Dx: Same  Successful US guided left sided thoracentesis yielding 500 cc of serous pleural fluid.   Samples sent to lab for analysis.  EBL: None  Complications: None immediate.  Ronny Bacon, MD Pager #: [phone]

## 2018-10-13 NOTE — Progress Notes (Signed)
Patient ID: Jessica Burgess, female   DOB: Dec 31, 1955, 63 y.o.   MRN: [id number]  [facility] PROGRESS NOTE  LIDYA MCCALISTER NOI:[id number] DOB: [birth year] DOA: 10/08/2018 PCP: Patient, No Pcp Per  HPI/Subjective: Had thoracentesis on the left is doing better Currently tired  Objective: Vitals:   10/13/18 1302 10/13/18 1351  BP: (!) 173/86 (!) 170/82  Pulse: 78 77  Resp:    Temp:    SpO2: 98% 95%    Intake/Output Summary (Last 24 hours) at 10/13/2018 1548 Last data filed at 10/13/2018 1205 Gross per 24 hour  Intake 120 ml  Output 2800 ml  Net -2680 ml   Filed Weights   10/11/18 0414 10/12/18 0345 10/13/18 0500  Weight: 72.5 kg 71.5 kg 70.4 kg    ROS: Review of Systems  Constitutional: Negative for chills and fever.  Eyes: Negative for blurred vision.  Respiratory: Positive for shortness of breath. Negative for cough.   Cardiovascular: Negative for chest pain.  Gastrointestinal: Negative for abdominal pain, constipation, diarrhea, nausea and vomiting.  Genitourinary: Negative for dysuria.  Musculoskeletal: Negative for joint pain.  Neurological: Negative for dizziness and headaches.   Exam: Physical Exam  Constitutional: She is oriented to person, place, and time.  HENT:  Nose: No mucosal edema.  Mouth/Throat: No oropharyngeal exudate or posterior oropharyngeal edema.  Eyes: Pupils are equal, round, and reactive to light. Conjunctivae, EOM and lids are normal.  Neck: No JVD present. Carotid bruit is not present. No edema present. No thyroid mass and no thyromegaly present.  Cardiovascular: S1 normal and S2 normal. Exam reveals no gallop.  No murmur heard. Pulses:      Dorsalis pedis pulses are 2+ on the right side and 2+ on the left side.  Respiratory: No respiratory distress. She has decreased breath sounds in the right lower field and the left lower field. She has no wheezes. She has no rhonchi. She has no rales.  GI: Soft. Bowel sounds are normal. She  exhibits distension. There is abdominal tenderness.  Musculoskeletal:     Right ankle: She exhibits swelling.     Left ankle: She exhibits swelling.  Lymphadenopathy:    She has no cervical adenopathy.  Neurological: She is alert and oriented to person, place, and time. No cranial nerve deficit.  Skin: Skin is warm. No rash noted. Nails show no clubbing.  Psychiatric: She has a normal mood and affect.      Data Reviewed: Basic Metabolic Panel: Recent Labs  Lab 10/08/18 0731 10/09/18 0546 10/10/18 0644 10/10/18 1750 10/11/18 0522 10/13/18 0003  NA 130* 130* 132*  --  137 134*  K 5.0 5.8* 4.1  --  3.9 4.3  CL 96* 93* 95*  --  98 95*  CO2 _0 --  29 29  GLUCOSE 162* 81 109*  --  115* 103*  BUN 46* 58* 24*  --  17 28*  CREATININE 4.21* 4.85* 2.87*  --  2.35* 3.94*  CALCIUM 8.2* 8.2* 7.9*  --  8.2* 8.6*  MG  --   --  2.3  --   --   --   PHOS  --   --   --  3.0  --  3.1   Liver Function Tests: Recent Labs  Lab 10/08/18 0731 10/13/18 0003  AST 33  --   ALT 24  --   ALKPHOS 95  --   BILITOT 0.9  --   PROT 5.8*  --   ALBUMIN 2.9*  2.3*   No results for input(s): LIPASE, AMYLASE in the last 168 hours. No results for input(s): AMMONIA in the last 168 hours. CBC: Recent Labs  Lab 10/08/18 0731 10/09/18 0546 10/10/18 0644 10/11/18 0522 10/13/18 0003 10/13/18 0420  WBC 3.0* 1.5* 1.4* 3.2* 4.7 4.0  NEUTROABS 2.6 1.0*  --   --   --  2.1  HGB 10.0* 9.2* 8.4* 9.4* 9.4* 10.2*  HCT 32.2* 29.6* 26.3* 30.1* 30.6* 32.6*  MCV 103.2* 105.0* 102.3* 104.9* 105.9* 103.2*  PLT 147* 125* 127* 122* 107* 118*   BNP (last 3 results) Recent Labs    08/28/18 0943 08/28/18 1443 10/08/18 0731  BNP >4,500.0* >4,500.0* 1,214.0*      Recent Results (from the past 240 hour(s))  SARS Coronavirus 2 (CEPHEID- Performed in [place] hospital lab), Hosp Order     Status: None   Collection Time: 10/08/18  7:31 AM   Specimen: Nasopharyngeal Swab  Result Value Ref Range Status    SARS Coronavirus 2 NEGATIVE NEGATIVE Final    Comment: (NOTE) If result is NEGATIVE SARS-CoV-2 target nucleic acids are NOT DETECTED. The SARS-CoV-2 RNA is generally detectable in upper and lower  respiratory specimens during the acute phase of infection. The lowest  concentration of SARS-CoV-2 viral copies this assay can detect is 250  copies / mL. A negative result does not preclude SARS-CoV-2 infection  and should not be used as the sole basis for treatment or other  patient management decisions.  A negative result may occur with  improper specimen collection / handling, submission of specimen other  than nasopharyngeal swab, presence of viral mutation(s) within the  areas targeted by this assay, and inadequate number of viral copies  (<250 copies / mL). A negative result must be combined with clinical  observations, patient history, and epidemiological information. If result is POSITIVE SARS-CoV-2 target nucleic acids are DETECTED. The SARS-CoV-2 RNA is generally detectable in upper and lower  respiratory specimens dur ing the acute phase of infection.  Positive  results are indicative of active infection with SARS-CoV-2.  Clinical  correlation with patient history and other diagnostic information is  necessary to determine patient infection status.  Positive results do  not rule out bacterial infection or co-infection with other viruses. If result is PRESUMPTIVE POSTIVE SARS-CoV-2 nucleic acids MAY BE PRESENT.   A presumptive positive result was obtained on the submitted specimen  and confirmed on repeat testing.  While 2019 novel coronavirus  (SARS-CoV-2) nucleic acids may be present in the submitted sample  additional confirmatory testing may be necessary for epidemiological  and / or clinical management purposes  to differentiate between  SARS-CoV-2 and other Sarbecovirus currently known to infect humans.  If clinically indicated additional testing with an alternate test   methodology [phone]) is advised. The SARS-CoV-2 RNA is generally  detectable in upper and lower respiratory sp ecimens during the acute  phase of infection. The expected result is Negative. Fact Sheet for Patients:  StrictlyIdeas.no Fact Sheet for Healthcare Providers: BankingDealers.co.za This test is not yet approved or cleared by the [place] FDA and has been authorized for detection and/or diagnosis of SARS-CoV-2 by FDA under an Emergency Use Authorization (EUA).  This EUA will remain in effect (meaning this test can be used) for the duration of the COVID-19 declaration under Section 564(b)(1) of the Act, 21 U.S.C. section 360bbb-3(b)(1), unless the authorization is terminated or revoked sooner. Performed at [facility], [address] [place], [place] [address]  Blood Culture (routine x 2)     Status: None   Collection Time: 10/08/18  7:32 AM   Specimen: BLOOD  Result Value Ref Range Status   Specimen Description BLOOD RIGHT FA  Final   Special Requests   Final    BOTTLES DRAWN AEROBIC AND ANAEROBIC Blood Culture adequate volume   Culture   Final    NO GROWTH 5 DAYS Performed at [facility], [address] [place], [place] [address]    Report Status 10/13/2018 FINAL  Final  Blood Culture (routine x 2)     Status: None   Collection Time: 10/08/18  7:32 AM   Specimen: BLOOD  Result Value Ref Range Status   Specimen Description BLOOD RIGHT HAND  Final   Special Requests   Final    BOTTLES DRAWN AEROBIC AND ANAEROBIC Blood Culture results may not be optimal due to an excessive volume of blood received in culture bottles   Culture   Final    NO GROWTH 5 DAYS Performed at [facility], [address] [place], [place] [address]    Report Status 10/13/2018 FINAL  Final  Urine culture     Status: None   Collection Time: 10/08/18  8:35 AM   Specimen: Urine, Random  Result Value Ref Range  Status   Specimen Description   Final    URINE, RANDOM Performed at [facility], [address], [place], [place] [address]    Special Requests   Final    NONE Performed at [facility], [address] [place], [place] [address]    Culture   Final    NO GROWTH Performed at [facility], [place] [address], [place], [place] [address]    Report Status 10/09/2018 FINAL  Final  C difficile quick scan w PCR reflex     Status: None   Collection Time: 10/09/18  4:10 AM   Specimen: STOOL  Result Value Ref Range Status   C Diff antigen NEGATIVE NEGATIVE Final   C Diff toxin NEGATIVE NEGATIVE Final   C Diff interpretation No C. difficile detected.  Final    Comment: Performed at [facility], [address] [place], [place] [address]  Body fluid culture     Status: None   Collection Time: 10/09/18  5:00 PM   Specimen: [facility] Cytology Pleural fluid  Result Value Ref Range Status   Specimen Description   Final    PLEURAL Performed at [facility], [address], [place], [place] [address]    Special Requests   Final    NONE Performed at [facility], [address] [place], [place] [address]    Gram Stain   Final    FEW WBC PRESENT,BOTH PMN AND MONONUCLEAR NO ORGANISMS SEEN    Culture   Final    NO GROWTH 3 DAYS Performed at [facility], [place] [address], [place], [place] [address]    Report Status 10/13/2018 FINAL  Final     Studies: Ct Chest Wo Contrast  Result Date: 10/12/2018 CLINICAL DATA:  Pleural effusion. Clinical sepsis and neutropenic fever. Status post thoracentesis. EXAM: CT CHEST WITHOUT CONTRAST TECHNIQUE: Multidetector CT imaging of the chest was performed following the standard protocol without IV contrast. COMPARISON:  Chest CT 08/09/2018 FINDINGS: Cardiovascular: The heart is enlarged but stable. No pericardial effusion. Stable tortuosity, ectasia and calcification of the thoracic aorta. Scattered  coronary artery calcifications. Right-sided Port-A-Cath and left IJ dialysis  catheter in good positions without complicating features. Mediastinum/Nodes: Stable scattered mediastinal and hilar lymph nodes without mass or overt adenopathy. The esophagus is grossly normal. Lungs/Pleura: Moderate-sized bilateral pleural effusions with overlying atelectasis. Patchy inflammatory changes in both lungs with areas of tree-in-bud appearance suggesting chronic inflammation or atypical infection such as MAC or viral pneumonitis. No worrisome pulmonary lesions. No endobronchial lesions are identified. Upper Abdomen: No significant upper abdominal findings. Musculoskeletal: No significant bony findings. No breast masses, supraclavicular or axillary adenopathy. IMPRESSION: 1. Moderate-sized bilateral pleural effusions with overlying atelectasis. 2. Patchy inflammatory or atypical infectious process in the lungs. No focal pneumonia. No worrisome pulmonary lesions. 3. Stable cardiac enlargement and tortuosity and calcification of the thoracic aorta. 4. Stable mediastinal and hilar lymph nodes without mass or overt adenopathy. Aortic Atherosclerosis (ICD10-I70.0). Electronically Signed   By: Marijo Sanes M.D.   On: 10/12/2018 12:26   Dg Chest Port 1 View  Result Date: 10/13/2018 CLINICAL DATA:  Symptomatic bilateral pleural effusions. Please perform left-sided ultrasound-guided thoracentesis for diagnostic and therapeutic purposes. EXAM: PORTABLE CHEST 1 VIEW COMPARISON:  Chest CT - 10/12/2018; chest radiograph - 10/10/2018; ultrasound-guided right-sided thoracentesis - 10/09/2018 FINDINGS: Grossly unchanged enlarged cardiac silhouette and mediastinal contours. Stable position of support apparatus. Interval reduction/resolution of left-sided pleural effusion post thoracentesis. No pneumothorax. Improved aeration of the left lung base. Unchanged small right-sided effusion associated right basilar opacities. No new focal airspace  opacities. Pulmonary remains indistinct with cephalization. Unchanged bones. Post lower cervical ACDF, incompletely evaluated. IMPRESSION: 1. Interval reduction/resolution of left-sided pleural effusion post thoracentesis. No pneumothorax. 2. Similar findings of cardiomegaly, pulmonary edema, small right-sided effusion and right basilar opacities, likely atelectasis. Electronically Signed   By: Sandi Mariscal M.D.   On: 10/13/2018 14:23   US Thoracentesis Asp Pleural Space W/img Guide  Result Date: 10/13/2018 INDICATION: Symptomatic left-sided pleural effusion. Please from ultrasound-guided thoracentesis for therapeutic and diagnostic purposes. EXAM: US THORACENTESIS ASP PLEURAL SPACE W/IMG GUIDE COMPARISON:  Chest CT-10/12/2018; chest radiograph-10/10/2018; ultrasound-guided right-sided thoracentesis yielding 700 cc of pleural fluid-10/09/2018 MEDICATIONS: None. COMPLICATIONS: None immediate. TECHNIQUE: Informed written consent was obtained from the patient after a discussion of the risks, benefits and alternatives to treatment. A timeout was performed prior to the initiation of the procedure. Initial ultrasound scanning demonstrates a moderate-sized bilateral pleural effusions. Patient requested left-sided ultrasound-guided thoracentesis. As such, the left lower chest was prepped and draped in the usual sterile fashion. 1% lidocaine was used for local anesthesia. An ultrasound image was saved for documentation purposes. An 8 Fr Safe-T-Centesis catheter was introduced. The thoracentesis was performed. The catheter was removed and a dressing was applied. The patient tolerated the procedure well without immediate post procedural complication. The patient was escorted to have an upright chest radiograph. FINDINGS: A total of approximately 500 liters of serous fluid was removed. Requested samples were sent to the laboratory. IMPRESSION: Successful ultrasound-guided left sided thoracentesis yielding 500 cc of pleural  fluid. Electronically Signed   By: Sandi Mariscal M.D.   On: 10/13/2018 14:45    Scheduled Meds: . amiodarone  400 mg Oral BID   Followed by  . [START ON 10/17/2018] amiodarone  200 mg Oral BID  . amoxicillin-clavulanate  1 tablet Oral Daily  . Chlorhexidine Gluconate Cloth  6 each Topical Q0600  . Chlorhexidine Gluconate Cloth  6 each Topical Q0600  . DULoxetine  60 mg Oral Daily  . epoetin (EPOGEN/PROCRIT) injection  10,000 Units Intravenous Q T,Th,Sa-HD  . feeding supplement (NEPRO CARB STEADY)  237 mL  Oral BID BM  . ferrous sulfate  325 mg Oral Daily  . furosemide  40 mg Intravenous Q12H  . hydrALAZINE  25 mg Oral TID  . ipratropium-albuterol  3 mL Nebulization Q6H  . isosorbide mononitrate  30 mg Oral Daily  . levothyroxine  25 mcg Oral QAC breakfast  . loratadine  10 mg Oral Daily  . magnesium oxide  400 mg Oral BID  . metoprolol succinate  100 mg Oral Daily  . multivitamin  1 tablet Oral QHS  . sodium chloride flush  3 mL Intravenous Q12H  . vitamin B-12  1,000 mcg Oral Daily  . vitamin C  500 mg Oral Daily   Continuous Infusions: . sodium chloride 250 mL (10/10/18 2239)    Assessment/Plan:  1. Clinical sepsis and neutropenic fever.  No source of fever identified will change to oral Augmentin can DC tomorrow 2. Pleural effusion status post thoracentesis LDH is elevated possible malignant pleural effusion fluid cytology pending I called the pathology lab they state fluid still being analyzed for cytology , patient had left-sided thoracentesis today 3. acute on chronic systolic congestive heart failure with shortness of breath.  Currently receiving dialysis continue Lasix IV  4. A. fib with RVR now on oral amiodarone resume eliquis 5. ovarian cancer undergoing chemotherapy.  Last chemo on Tuesday.  Pancytopenia secondary to chemotherapy.  Patient listed as a full code. 6. End-stage renal disease on dialysis 7. Hypertension continue hydralazine and metoprolol 8. Hypothyroidism  unspecified on levothyroxine  Code Status:     Code Status Orders  (From admission, onward)         Start     Ordered   10/08/18 1047  Full code  Continuous     10/08/18 1049        Code Status History    Date Active Date Inactive Code Status Order ID Comments User Context   08/28/2018 1952 08/30/2018 1714 Full Code [id number]  Gorden Harms, MD Inpatient   08/06/2018 2355 08/18/2018 1650 Full Code [id number]  Mayer Camel, NP ED   Advance Care Planning Activity    Advance Directive Documentation     Most Recent Value  Type of Advance Directive  Healthcare Power of Attorney, Living will  Pre-existing out of facility DNR order (yellow form or pink MOST form)  -  "MOST" Form in Place?  -     Family Communication: Spoke with sister on the phone Disposition Plan: To be determined  Antibiotics:  Vancomycin  Cefepime  Time spent: 28 minutes  [place]

## 2018-10-13 NOTE — Progress Notes (Signed)
[facility]  ROUNDING NOTE   Subjective:   Seen and examined on hemodialysis treatment. Tolerating treatment well. UF goal 2.5 liters. 2K bath  Objective:  Vital signs in last 24 hours:  Temp:  [97.4 F (36.3 C)-98 F (36.7 C)] 97.6 F (36.4 C) (07/28 0755) Pulse Rate:  [66-73] 70 (07/28 0755) Resp:  [17-20] 18 (07/28 0755) BP: (133-168)/(70-87) 167/87 (07/28 0755) SpO2:  [97 %-100 %] 100 % (07/28 0755) Weight:  [70.4 kg] 70.4 kg (07/28 0500)  Weight change: -1.089 kg Filed Weights   10/11/18 0414 10/12/18 0345 10/13/18 0500  Weight: 72.5 kg 71.5 kg 70.4 kg    Intake/Output: I/O last 3 completed shifts: In: 424.5 [P.O.:360; I.V.:64.5] Out: 400 [Urine:400]   Intake/Output this shift:  No intake/output data recorded.  Physical Exam: General: No acute distress, laying in bed  Head: Normocephalic, atraumatic. Moist oral mucosal membranes  Eyes: Anicteric  Neck: Supple, trachea midline  Lungs:  Left sided diminished at base  Heart: irregular  Abdomen:  Soft, nontender, bowel sounds present  Extremities: + peripheral edema.  Neurologic: Awake, alert, following commands  Skin: No lesions  Access: Left IJ PermCath    Basic Metabolic Panel: Recent Labs  Lab 10/08/18 0731 10/09/18 0546 10/10/18 0644 10/10/18 1750 10/11/18 0522 10/13/18 0003  NA 130* 130* 132*  --  137 134*  K 5.0 5.8* 4.1  --  3.9 4.3  CL 96* 93* 95*  --  98 95*  CO2 _0 --  29 29  GLUCOSE 162* 81 109*  --  115* 103*  BUN 46* 58* 24*  --  17 28*  CREATININE 4.21* 4.85* 2.87*  --  2.35* 3.94*  CALCIUM 8.2* 8.2* 7.9*  --  8.2* 8.6*  MG  --   --  2.3  --   --   --   PHOS  --   --   --  3.0  --  3.1    Liver Function Tests: Recent Labs  Lab 10/06/18 1313 10/08/18 0731 10/13/18 0003  AST 26 33  --   ALT 26 24  --   ALKPHOS 91 95  --   BILITOT 0.8 0.9  --   PROT 5.8* 5.8*  --   ALBUMIN 3.0* 2.9* 2.3*   No results for input(s): LIPASE, AMYLASE in the last 168  hours. No results for input(s): AMMONIA in the last 168 hours.  CBC: Recent Labs  Lab 10/06/18 1312 10/08/18 0731 10/09/18 0546 10/10/18 0644 10/11/18 0522 10/13/18 0003 10/13/18 0420  WBC 4.7 3.0* 1.5* 1.4* 3.2* 4.7 4.0  NEUTROABS 3.4 2.6 1.0*  --   --   --  2.1  HGB 10.0* 10.0* 9.2* 8.4* 9.4* 9.4* 10.2*  HCT 32.5* 32.2* 29.6* 26.3* 30.1* 30.6* 32.6*  MCV 106.2* 103.2* 105.0* 102.3* 104.9* 105.9* 103.2*  PLT 100* 147* 125* 127* 122* 107* 118*    Cardiac Enzymes: No results for input(s): CKTOTAL, CKMB, CKMBINDEX, TROPONINI in the last 168 hours.  BNP: Invalid input(s): POCBNP  CBG: No results for input(s): GLUCAP in the last 168 hours.  Microbiology: Results for orders placed or performed during the hospital encounter of 10/08/18  SARS Coronavirus 2 (CEPHEID- Performed in [place] hospital lab), Hosp Order     Status: None   Collection Time: 10/08/18  7:31 AM   Specimen: Nasopharyngeal Swab  Result Value Ref Range Status   SARS Coronavirus 2 NEGATIVE NEGATIVE Final    Comment: (NOTE) If result is NEGATIVE  SARS-CoV-2 target nucleic acids are NOT DETECTED. The SARS-CoV-2 RNA is generally detectable in upper and lower  respiratory specimens during the acute phase of infection. The lowest  concentration of SARS-CoV-2 viral copies this assay can detect is 250  copies / mL. A negative result does not preclude SARS-CoV-2 infection  and should not be used as the sole basis for treatment or other  patient management decisions.  A negative result may occur with  improper specimen collection / handling, submission of specimen other  than nasopharyngeal swab, presence of viral mutation(s) within the  areas targeted by this assay, and inadequate number of viral copies  (<250 copies / mL). A negative result must be combined with clinical  observations, patient history, and epidemiological information. If result is POSITIVE SARS-CoV-2 target nucleic acids are DETECTED. The  SARS-CoV-2 RNA is generally detectable in upper and lower  respiratory specimens dur ing the acute phase of infection.  Positive  results are indicative of active infection with SARS-CoV-2.  Clinical  correlation with patient history and other diagnostic information is  necessary to determine patient infection status.  Positive results do  not rule out bacterial infection or co-infection with other viruses. If result is PRESUMPTIVE POSTIVE SARS-CoV-2 nucleic acids MAY BE PRESENT.   A presumptive positive result was obtained on the submitted specimen  and confirmed on repeat testing.  While 2019 novel coronavirus  (SARS-CoV-2) nucleic acids may be present in the submitted sample  additional confirmatory testing may be necessary for epidemiological  and / or clinical management purposes  to differentiate between  SARS-CoV-2 and other Sarbecovirus currently known to infect humans.  If clinically indicated additional testing with an alternate test  methodology [phone]) is advised. The SARS-CoV-2 RNA is generally  detectable in upper and lower respiratory sp ecimens during the acute  phase of infection. The expected result is Negative. Fact Sheet for Patients:  StrictlyIdeas.no Fact Sheet for Healthcare Providers: BankingDealers.co.za This test is not yet approved or cleared by the [place] FDA and has been authorized for detection and/or diagnosis of SARS-CoV-2 by FDA under an Emergency Use Authorization (EUA).  This EUA will remain in effect (meaning this test can be used) for the duration of the COVID-19 declaration under Section 564(b)(1) of the Act, 21 U.S.C. section 360bbb-3(b)(1), unless the authorization is terminated or revoked sooner. Performed at [facility], [address] [place], [place] [address]   Blood Culture (routine x 2)     Status: None   Collection Time: 10/08/18  7:32 AM   Specimen: BLOOD  Result  Value Ref Range Status   Specimen Description BLOOD RIGHT FA  Final   Special Requests   Final    BOTTLES DRAWN AEROBIC AND ANAEROBIC Blood Culture adequate volume   Culture   Final    NO GROWTH 5 DAYS Performed at [facility], [address] [place], [place] [address]    Report Status 10/13/2018 FINAL  Final  Blood Culture (routine x 2)     Status: None   Collection Time: 10/08/18  7:32 AM   Specimen: BLOOD  Result Value Ref Range Status   Specimen Description BLOOD RIGHT HAND  Final   Special Requests   Final    BOTTLES DRAWN AEROBIC AND ANAEROBIC Blood Culture results may not be optimal due to an excessive volume of blood received in culture bottles   Culture   Final    NO GROWTH 5 DAYS Performed at [facility], [place]  [address] [place], [place] [address]    Report Status 10/13/2018 FINAL  Final  Urine culture     Status: None   Collection Time: 10/08/18  8:35 AM   Specimen: Urine, Random  Result Value Ref Range Status   Specimen Description   Final    URINE, RANDOM Performed at [facility], [address], [place], [place] [address]    Special Requests   Final    NONE Performed at [facility], [address] [place], [place] [address]    Culture   Final    NO GROWTH Performed at [facility], [place] [address], [place], [place] [address]    Report Status 10/09/2018 FINAL  Final  C difficile quick scan w PCR reflex     Status: None   Collection Time: 10/09/18  4:10 AM   Specimen: STOOL  Result Value Ref Range Status   C Diff antigen NEGATIVE NEGATIVE Final   C Diff toxin NEGATIVE NEGATIVE Final   C Diff interpretation No C. difficile detected.  Final    Comment: Performed at [facility], [address] [place], [place] [address]  Body fluid culture     Status: None   Collection Time: 10/09/18  5:00 PM   Specimen: [facility] Cytology Pleural fluid  Result Value Ref Range Status   Specimen Description   Final     PLEURAL Performed at [facility], [address], [place], [place] [address]    Special Requests   Final    NONE Performed at [facility], [address] [place], [place] [address]    Gram Stain   Final    FEW WBC PRESENT,BOTH PMN AND MONONUCLEAR NO ORGANISMS SEEN    Culture   Final    NO GROWTH 3 DAYS Performed at [facility], [place] [address], [place],  [address]    Report Status 10/13/2018 FINAL  Final    Coagulation Studies: No results for input(s): LABPROT, INR in the last 72 hours.  Urinalysis: No results for input(s): COLORURINE, LABSPEC, PHURINE, GLUCOSEU, HGBUR, BILIRUBINUR, KETONESUR, PROTEINUR, UROBILINOGEN, NITRITE, LEUKOCYTESUR in the last 72 hours.  Invalid input(s): APPERANCEUR    Imaging: Ct Chest Wo Contrast  Result Date: 10/12/2018 CLINICAL DATA:  Pleural effusion. Clinical sepsis and neutropenic fever. Status post thoracentesis. EXAM: CT CHEST WITHOUT CONTRAST TECHNIQUE: Multidetector CT imaging of the chest was performed following the standard protocol without IV contrast. COMPARISON:  Chest CT 08/09/2018 FINDINGS: Cardiovascular: The heart is enlarged but stable. No pericardial effusion. Stable tortuosity, ectasia and calcification of the thoracic aorta. Scattered coronary artery calcifications. Right-sided Port-A-Cath and left IJ dialysis catheter in good positions without complicating features. Mediastinum/Nodes: Stable scattered mediastinal and hilar lymph nodes without mass or overt adenopathy. The esophagus is grossly normal. Lungs/Pleura: Moderate-sized bilateral pleural effusions with overlying atelectasis. Patchy inflammatory changes in both lungs with areas of tree-in-bud appearance suggesting chronic inflammation or atypical infection such as MAC or viral pneumonitis. No worrisome pulmonary lesions. No endobronchial lesions are identified. Upper Abdomen: No significant upper abdominal findings. Musculoskeletal: No  significant bony findings. No breast masses, supraclavicular or axillary adenopathy. IMPRESSION: 1. Moderate-sized bilateral pleural effusions with overlying atelectasis. 2. Patchy inflammatory or atypical infectious process in the lungs. No focal pneumonia. No worrisome pulmonary lesions. 3. Stable cardiac enlargement and tortuosity and calcification of the thoracic aorta. 4. Stable mediastinal and hilar lymph nodes without mass or overt adenopathy. Aortic Atherosclerosis (ICD10-I70.0). Electronically Signed   By:  Marijo Sanes M.D.   On: 10/12/2018 12:26     Medications:   . sodium chloride 250 mL (10/10/18 2239)   . amiodarone  400 mg Oral BID   Followed by  . [START ON 10/17/2018] amiodarone  200 mg Oral BID  . amoxicillin-clavulanate  1 tablet Oral Daily  . Chlorhexidine Gluconate Cloth  6 each Topical Q0600  . Chlorhexidine Gluconate Cloth  6 each Topical Q0600  . DULoxetine  60 mg Oral Daily  . epoetin (EPOGEN/PROCRIT) injection  10,000 Units Intravenous Q T,Th,Sa-HD  . ferrous sulfate  325 mg Oral Daily  . furosemide  40 mg Intravenous Q12H  . hydrALAZINE  25 mg Oral TID  . ipratropium-albuterol  3 mL Nebulization Q6H  . isosorbide mononitrate  30 mg Oral Daily  . levothyroxine  25 mcg Oral QAC breakfast  . loratadine  10 mg Oral Daily  . magnesium oxide  400 mg Oral BID  . metoprolol succinate  100 mg Oral Daily  . sodium chloride flush  3 mL Intravenous Q12H  . vitamin B-12  1,000 mcg Oral Daily  . vitamin C  500 mg Oral Daily   sodium chloride, acetaminophen, cyclobenzaprine, hydrOXYzine, LORazepam, metoCLOPramide, metoprolol tartrate, ondansetron, oxyCODONE-acetaminophen **AND** oxyCODONE, polyethylene glycol, sodium chloride flush, sodium chloride flush, temazepam  Assessment/ Plan:  Jessica Burgess is a 63 y.o. white female with end stage renal disease on hemodialysis, ovarian cancer on chemotherapy, GERD, migraine headaches, cervical spinal stenosis, who was admitted  for shortness of breath and fever. Right thoracentesis on 7/24 yielding 732m.   [facility] TTS 70kg LIJ permcath  1.  ESRD on hemodialysis: seen and examined on hemodialysis. Tolerating treatment well. Continue TTS schedule.   2. Anemia with chronic kidney disease: and iron deficiency. Macrocytic. - IV venofer with outpatient dialysis treatment.  - EPO with HD treatment. Cleared by oncology.   3. Fever of unknown origin. Neutropenic. Blood counts have improved.    - Augmentin  4.  Secondary hyperparathyroidism.  Currently not on binders. Outpatient labs: PTH 267, phosphorus 5.8, calcium 8.7  5. Pleural effusion: status post right sided thoracentesis - left scheduled for today.   6. Hypertension with new onset atrial fibrillation: started on amiodarone - metoprolol for rate control - apixaban for anticoagulation - IV furosemide for pulmonary edema.  - home regimen includes: hydralazine and isosorbide mononitrate   LOS: 5 Jessica Burgess 7/28/20209:04 AM

## 2018-10-13 NOTE — Progress Notes (Signed)
Pre hemodialysis assessment   10/13/18 0900  Neurological  Level of Consciousness Alert  Orientation Level Oriented X4  Respiratory  Respiratory Pattern Regular  Chest Assessment Chest expansion symmetrical  Bilateral Breath Sounds Clear;Diminished  Cardiac  Pulse Regular  Jugular Venous Distention (JVD) No  ECG Monitor Yes  Cardiac Rhythm NSR  Vascular  R Radial Pulse +2  L Radial Pulse +2  Edema Right lower extremity;Left lower extremity  RLE Edema +1  LLE Edema +1  Integumentary  Integumentary (WDL) X  Skin Condition Dry  Musculoskeletal  Musculoskeletal (WDL) WDL  Generalized Weakness Yes  Gastrointestinal  Bowel Sounds Assessment Active  GU Assessment  Genitourinary (WDL) X  Genitourinary Symptoms Oliguria  Psychosocial  Psychosocial (WDL) WDL

## 2018-10-14 DIAGNOSIS — J90 Pleural effusion, not elsewhere classified: Secondary | ICD-10-CM

## 2018-10-14 LAB — BASIC METABOLIC PANEL
Anion gap: 10 (ref 5–15)
BUN: 17 mg/dL (ref 8–23)
CO2: 28 mmol/L (ref 22–32)
Calcium: 8.3 mg/dL — ABNORMAL LOW (ref 8.9–10.3)
Chloride: 99 mmol/L (ref 98–111)
Creatinine, Ser: 2.83 mg/dL — ABNORMAL HIGH (ref 0.44–1.00)
GFR calc Af Amer: 20 mL/min — ABNORMAL LOW (ref >60)
GFR calc non Af Amer: 17 mL/min — ABNORMAL LOW (ref >60)
Glucose, Bld: 76 mg/dL (ref 70–99)
Potassium: 3.4 mmol/L — ABNORMAL LOW (ref 3.5–5.1)
Sodium: 137 mmol/L (ref 135–145)

## 2018-10-14 LAB — CBC
HCT: 30 % — ABNORMAL LOW (ref 36.0–46.0)
Hemoglobin: 9.5 g/dL — ABNORMAL LOW (ref 12.0–15.0)
MCH: 32.4 pg (ref 26.0–34.0)
MCHC: 31.7 g/dL (ref 30.0–36.0)
MCV: 102.4 fL — ABNORMAL HIGH (ref 80.0–100.0)
Platelets: 118 10*3/uL — ABNORMAL LOW (ref 150–400)
RBC: 2.93 MIL/uL — ABNORMAL LOW (ref 3.87–5.11)
RDW: 15.1 % (ref 11.5–15.5)
WBC: 4.7 10*3/uL (ref 4.0–10.5)
nRBC: 0.4 % — ABNORMAL HIGH (ref 0.0–0.2)

## 2018-10-14 LAB — ACID FAST SMEAR (AFB, MYCOBACTERIA): Acid Fast Smear: NEGATIVE

## 2018-10-14 MED ORDER — HEPARIN SOD (PORK) LOCK FLUSH 100 UNIT/ML IV SOLN
500.0000 [IU] | Freq: Once | INTRAVENOUS | Status: AC
  Administered 2018-10-14: 500 [IU] via INTRAVENOUS
  Filled 2018-10-14: qty 5

## 2018-10-14 MED ORDER — LOSARTAN POTASSIUM 100 MG PO TABS: 100.0000 mg | ORAL_TABLET | Freq: Every day | ORAL | 0 refills | Status: DC

## 2018-10-14 MED ORDER — LOSARTAN POTASSIUM 50 MG PO TABS
100.0000 mg | ORAL_TABLET | Freq: Every day | ORAL | Status: DC
  Administered 2018-10-14: 100 mg via ORAL
  Filled 2018-10-14: qty 2

## 2018-10-14 MED ORDER — AMIODARONE HCL 400 MG PO TABS: 400.0000 mg | ORAL_TABLET | Freq: Two times a day (BID) | ORAL | 0 refills | Status: DC

## 2018-10-14 MED ORDER — TORSEMIDE 20 MG PO TABS: 40.0000 mg | ORAL_TABLET | Freq: Two times a day (BID) | ORAL | 0 refills | Status: DC

## 2018-10-14 MED ORDER — AMIODARONE HCL 200 MG PO TABS: 200.0000 mg | ORAL_TABLET | Freq: Two times a day (BID) | ORAL | 0 refills | Status: DC

## 2018-10-14 MED ORDER — APIXABAN 5 MG PO TABS: 5.0000 mg | ORAL_TABLET | Freq: Two times a day (BID) | ORAL | 0 refills | Status: DC

## 2018-10-14 MED ORDER — METOPROLOL SUCCINATE ER 100 MG PO TB24: 100.0000 mg | ORAL_TABLET | Freq: Every day | ORAL | 0 refills | Status: DC

## 2018-10-14 MED ORDER — AMOXICILLIN-POT CLAVULANATE 500-125 MG PO TABS: 1.0000 | ORAL_TABLET | Freq: Every day | ORAL | 0 refills | Status: AC

## 2018-10-14 NOTE — Discharge Summary (Signed)
[facility] NAME: Jessica Burgess    MR#:  [id number]  DATE OF BIRTH:  [birth year]  DATE OF ADMISSION:  10/08/2018 ADMITTING PHYSICIAN: Lang Snow, NP  DATE OF DISCHARGE: 10/14/2018  PRIMARY CARE PHYSICIAN: Patient, No Pcp Per   ADMISSION DIAGNOSIS:  SOB (shortness of breath) [R06.02] Sepsis DISCHARGE DIAGNOSIS:  Active Problems:   Sepsis (HCC)   Paroxysmal atrial fibrillation (HCC)   Acute on chronic heart failure with preserved ejection fraction (HFpEF) (HCC) Neutropenia COPD End-stage renal disease on dialysis Stage III ovarian cancer  SECONDARY DIAGNOSIS:   Past Medical History:  Diagnosis Date  . Allergic rhinitis, cause unspecified   . Anxiety state, unspecified   . Arthritis   . Asthma    only when sick   . Backache, unspecified   . Bronchitis    hx of when get sick  . Cancer ([place])    skin cancer , basal cell   . Cancer ([place]) 11/2016   ovarian  . Cervicalgia   . CHF (congestive heart failure) ([place])   . Complication of anesthesia   . Dermatophytosis of nail   . Dysmetabolic syndrome X   . Encounter for long-term (current) use of other medications   . Esophageal reflux   . Hypertension   . Insomnia, unspecified   . Leukocytosis, unspecified   . Migraine without aura, without mention of intractable migraine without mention of status migrainosus   . Other and unspecified hyperlipidemia   . Other malaise and fatigue   . Overweight(278.02)   . Personal history of chemotherapy now   ovarian  . PONV (postoperative nausea and vomiting)   . Renal insufficiency   . Spinal stenosis in cervical region   . Symptomatic menopausal or female climacteric states   . Unspecified disorder of skin and subcutaneous tissue   . Unspecified vitamin D deficiency      ADMITTING HISTORY 63 year old female with multiple past medical history including stage III ovarian cancer on chemotherapy, last chemo on Tuesday,  ESRD on hemodialysis Tuesday, Thursday and Saturday, CHF, COPD with chronic oxygen use, hypothyroidism, and hypertension presenting to the ED with chief complaints of shortness of breath, fevers and chills. Patient reports progressive worsening of her symptoms for the past few months.  She noticed worsening bilateral lower extremity edema and cough without associated chest pain.  Patient is noted to have difficulty sleeping in complete sentences and not able to give adequate history due to shortness of breath. On arrival to the ED, she was febrile temp of 102.5 with blood pressure 159/91 mm Hg and pulse rate 92-101 beats/min. There were no focal neurological deficits; she was alert and oriented x4.  Initial labs revealed lactic 1.1, sodium 130, WBC 3.0, platelets 147, BNP 1214, troponin 37, procalcitonin 17.4.  Chest x-ray showed mild congestive heart failure with slightly increased bibasilar atelectasis.  Due to her immunocompromised status she was started on empiric antibiotics with gentle IV fluids.   HOSPITAL COURSE:  Patient was admitted to medical floor.  Started on broad-spectrum antibiotics initially.  Nephrology, oncology consultations were done during hospitalization.  Patient gets dialysis on Tuesday Thursday and Saturday.  Patient had right-sided and left-sided thoracentesis during the hospitalization.  Nephrology recommended IV Venofer with outpatient dialysis.  Patient was seen by oncology for neutropenic fever.  She received chemotherapy with single agent gemcitabine on October 06, 2018.  Pancytopenia improved.  Neutropenia and fever resolved.  Patient was switched to oral Augmentin  antibiotic.  Pleural effusion was tapped pending results.  For atrial fibrillation patient was started on oral amiodarone for rate control.  Patient will be transitioned to oral torsemide at discharge.  Patient started on oral Eliquis for anticoagulation for atrial fibrillation and will continue at discharge.   Losartan was also added by nephrology for control of blood pressure.  Patient's fever resolved.  She will be discharged home on oral Augmentin antibiotic.  Comfortable on oxygen by nasal cannula at 2 L.  Blood cultures and urine culture did not reveal any growth.  Pleural fluid results pending.   CONSULTS OBTAINED:  Treatment Team:  Anthonette Legato, MD Lloyd Huger, MD Minna Merritts, MD  DRUG ALLERGIES:  No Known Allergies  DISCHARGE MEDICATIONS:   Allergies as of 10/14/2018   No Known Allergies     Medication List    STOP taking these medications   furosemide 80 MG tablet Commonly known as: LASIX     TAKE these medications   albuterol 108 (90 Base) MCG/ACT inhaler Commonly known as: VENTOLIN HFA Inhale 2 puffs into the lungs every 6 (six) hours as needed for wheezing or shortness of breath.   albuterol (2.5 MG/3ML) 0.083% nebulizer solution Commonly known as: PROVENTIL Take 3 mLs (2.5 mg total) by nebulization every 6 (six) hours as needed for wheezing or shortness of breath.   amiodarone 400 MG tablet Commonly known as: PACERONE Take 1 tablet (400 mg total) by mouth 2 (two) times daily for 3 days.   amiodarone 200 MG tablet Commonly known as: PACERONE Take 1 tablet (200 mg total) by mouth 2 (two) times daily. Start taking on: October 17, 2018   amoxicillin-clavulanate 500-125 MG tablet Commonly known as: AUGMENTIN Take 1 tablet (500 mg total) by mouth daily for 5 days.   apixaban 5 MG Tabs tablet Commonly known as: ELIQUIS Take 1 tablet (5 mg total) by mouth 2 (two) times daily.   cetirizine 10 MG tablet Commonly known as: ZYRTEC Take 10 mg by mouth daily.   cyclobenzaprine 10 MG tablet Commonly known as: FLEXERIL Take one tablet every 8 hours as needed What changed:   how much to take  how to take this  when to take this  reasons to take this  additional instructions   Dulera 200-5 MCG/ACT Aero Generic drug:  mometasone-formoterol Inhale 2 puffs into the lungs 2 (two) times daily.   DULoxetine 60 MG capsule Commonly known as: CYMBALTA Take 1 capsule (60 mg total) by mouth daily.   Fish Oil 1000 MG Caps Take 1,000 capsules by mouth 2 (two) times daily.   fluticasone 50 MCG/ACT nasal spray Commonly known as: FLONASE Place 2 sprays into both nostrils as needed.   hydrALAZINE 25 MG tablet Commonly known as: APRESOLINE Take 1 tablet (25 mg total) by mouth 3 (three) times daily. For BP if above 150/90   hydrOXYzine 10 MG tablet Commonly known as: ATARAX/VISTARIL Take 1 tablet (10 mg total) by mouth every 6 (six) hours as needed. What changed: reasons to take this   ipratropium 17 MCG/ACT inhaler Commonly known as: ATROVENT HFA Inhale 2 puffs into the lungs every 6 (six) hours as needed for wheezing.   Iron Supplement 325 (65 FE) MG tablet Generic drug: ferrous sulfate Take 1 tablet by mouth daily.   isosorbide mononitrate 30 MG 24 hr tablet Commonly known as: IMDUR Take 1 tablet (30 mg total) by mouth daily.   levothyroxine 25 MCG tablet Commonly known as: Synthroid Take 1  tablet (25 mcg total) by mouth daily before breakfast.   lidocaine 2 % solution Commonly known as: XYLOCAINE Use as directed 15 mLs in the mouth or throat as needed for mouth pain.   losartan 100 MG tablet Commonly known as: COZAAR Take 1 tablet (100 mg total) by mouth daily. Start taking on: October 15, 2018   Lysine 1000 MG Tabs Take 1,000 mg by mouth daily.   Magnesium Oxide 400 (240 Mg) MG Tabs Take 400 mg by mouth 2 (two) times daily.   metoCLOPramide 5 MG tablet Commonly known as: REGLAN Take 1 tablet (5 mg total) by mouth every 8 (eight) hours as needed for nausea or vomiting.   metoprolol succinate 100 MG 24 hr tablet Commonly known as: TOPROL-XL Take 1 tablet (100 mg total) by mouth daily. Take with or immediately following a meal. Start taking on: October 15, 2018 What changed:   medication  strength  how much to take   MULTIVITAMIN PO Take 1 tablet by mouth daily.   ondansetron 8 MG disintegrating tablet Commonly known as: ZOFRAN-ODT Take 1 tablet (8 mg total) by mouth every 8 (eight) hours as needed for nausea or vomiting.   oxyCODONE-acetaminophen 10-325 MG tablet Commonly known as: Percocet Take 1 tablet by mouth every 6 (six) hours as needed for pain.   polyethylene glycol powder 17 GM/SCOOP powder Commonly known as: GLYCOLAX/MIRALAX Take 17 g by mouth daily as needed for mild constipation or moderate constipation.   SUMAtriptan 100 MG tablet Commonly known as: IMITREX May repeat in 2 hours if headache persists or recurs. What changed:   how much to take  how to take this  when to take this  reasons to take this   temazepam 30 MG capsule Commonly known as: RESTORIL Take 1 capsule (30 mg total) by mouth at bedtime as needed for sleep.   torsemide 20 MG tablet Commonly known as: Demadex Take 2 tablets (40 mg total) by mouth 2 (two) times daily.   vitamin B-12 1000 MCG tablet Commonly known as: CYANOCOBALAMIN Take 1,000 mcg by mouth daily.   vitamin C 500 MG tablet Commonly known as: ASCORBIC ACID Take 500 mg by mouth daily.   Vitamin D-1000 Max St 25 MCG (1000 UT) tablet Generic drug: Cholecalciferol Take 1,000 Units by mouth 2 (two) times daily.       Today  Patient seen today No fever No shortness of breath Hemodynamically stable Tolerating diet well  VITAL SIGNS:  Blood pressure (!) 176/91, pulse 74, temperature 98.2 F (36.8 C), temperature source Oral, resp. rate 19, height 5\' 2"  (1.575 m), weight 72.3 kg, SpO2 92 %.  I/O:    Intake/Output Summary (Last 24 hours) at 10/14/2018 1418 Last data filed at 10/14/2018 1131 Gross per 24 hour  Intake 240 ml  Output 300 ml  Net -60 ml    PHYSICAL EXAMINATION:  Physical Exam  GENERAL:  63 y.o.-year-old patient lying in the bed with no acute distress.  LUNGS: Normal breath sounds  bilaterally, no wheezing, rales,rhonchi or crepitation. No use of accessory muscles of respiration.  CARDIOVASCULAR: S1, S2 normal. No murmurs, rubs, or gallops.  ABDOMEN: Soft, non-tender, non-distended. Bowel sounds present. No organomegaly or mass.  NEUROLOGIC: Moves all 4 extremities. PSYCHIATRIC: The patient is alert and oriented x 3.  SKIN: No obvious rash, lesion, or ulcer.   DATA REVIEW:   CBC Recent Labs  Lab 10/14/18 0301  WBC 4.7  HGB 9.5*  HCT 30.0*  PLT 118*  Chemistries  Recent Labs  Lab 10/08/18 0731  10/10/18 0644  10/14/18 0301  NA 130*   < > 132*   < > 137  K 5.0   < > 4.1   < > 3.4*  CL 96*   < > 95*   < > 99  CO2 24   < > 28   < > 28  GLUCOSE 162*   < > 109*   < > 76  BUN 46*   < > 24*   < > 17  CREATININE 4.21*   < > 2.87*   < > 2.83*  CALCIUM 8.2*   < > 7.9*   < > 8.3*  MG  --   --  2.3  --   --   AST 33  --   --   --   --   ALT 24  --   --   --   --   ALKPHOS 95  --   --   --   --   BILITOT 0.9  --   --   --   --    < > = values in this interval not displayed.    Cardiac Enzymes No results for input(s): TROPONINI in the last 168 hours.  Microbiology Results  Results for orders placed or performed during the hospital encounter of 10/08/18  SARS Coronavirus 2 (CEPHEID- Performed in [facility] hospital lab), Hosp Order     Status: None   Collection Time: 10/08/18  7:31 AM   Specimen: Nasopharyngeal Swab  Result Value Ref Range Status   SARS Coronavirus 2 NEGATIVE NEGATIVE Final    Comment: (NOTE) If result is NEGATIVE SARS-CoV-2 target nucleic acids are NOT DETECTED. The SARS-CoV-2 RNA is generally detectable in upper and lower  respiratory specimens during the acute phase of infection. The lowest  concentration of SARS-CoV-2 viral copies this assay can detect is 250  copies / mL. A negative result does not preclude SARS-CoV-2 infection  and should not be used as the sole basis for treatment or other  patient management decisions.  A  negative result may occur with  improper specimen collection / handling, submission of specimen other  than nasopharyngeal swab, presence of viral mutation(s) within the  areas targeted by this assay, and inadequate number of viral copies  (<250 copies / mL). A negative result must be combined with clinical  observations, patient history, and epidemiological information. If result is POSITIVE SARS-CoV-2 target nucleic acids are DETECTED. The SARS-CoV-2 RNA is generally detectable in upper and lower  respiratory specimens dur ing the acute phase of infection.  Positive  results are indicative of active infection with SARS-CoV-2.  Clinical  correlation with patient history and other diagnostic information is  necessary to determine patient infection status.  Positive results do  not rule out bacterial infection or co-infection with other viruses. If result is PRESUMPTIVE POSTIVE SARS-CoV-2 nucleic acids MAY BE PRESENT.   A presumptive positive result was obtained on the submitted specimen  and confirmed on repeat testing.  While 2019 novel coronavirus  (SARS-CoV-2) nucleic acids may be present in the submitted sample  additional confirmatory testing may be necessary for epidemiological  and / or clinical management purposes  to differentiate between  SARS-CoV-2 and other Sarbecovirus currently known to infect humans.  If clinically indicated additional testing with an alternate test  methodology [phone]) is advised. The SARS-CoV-2 RNA is generally  detectable in upper and lower respiratory sp ecimens during  the acute  phase of infection. The expected result is Negative. Fact Sheet for Patients:  StrictlyIdeas.no Fact Sheet for Healthcare Providers: BankingDealers.co.za This test is not yet approved or cleared by the [place] FDA and has been authorized for detection and/or diagnosis of SARS-CoV-2 by FDA under an Emergency Use  Authorization (EUA).  This EUA will remain in effect (meaning this test can be used) for the duration of the COVID-19 declaration under Section 564(b)(1) of the Act, 21 U.S.C. section 360bbb-3(b)(1), unless the authorization is terminated or revoked sooner. Performed at [facility], [address] [place], [place] [address]   Blood Culture (routine x 2)     Status: None   Collection Time: 10/08/18  7:32 AM   Specimen: BLOOD  Result Value Ref Range Status   Specimen Description BLOOD RIGHT FA  Final   Special Requests   Final    BOTTLES DRAWN AEROBIC AND ANAEROBIC Blood Culture adequate volume   Culture   Final    NO GROWTH 5 DAYS Performed at [facility], [address] [place], [place] [address]    Report Status 10/13/2018 FINAL  Final  Blood Culture (routine x 2)     Status: None   Collection Time: 10/08/18  7:32 AM   Specimen: BLOOD  Result Value Ref Range Status   Specimen Description BLOOD RIGHT HAND  Final   Special Requests   Final    BOTTLES DRAWN AEROBIC AND ANAEROBIC Blood Culture results may not be optimal due to an excessive volume of blood received in culture bottles   Culture   Final    NO GROWTH 5 DAYS Performed at [facility], [address] [place], [place] [address]    Report Status 10/13/2018 FINAL  Final  Urine culture     Status: None   Collection Time: 10/08/18  8:35 AM   Specimen: Urine, Random  Result Value Ref Range Status   Specimen Description   Final    URINE, RANDOM Performed at [facility], [address], [place], [place] [address]    Special Requests   Final    NONE Performed at [facility], [address], [place], [place] [address]    Culture   Final    NO GROWTH Performed at [facility], [place] [address], [place], [place] [address]    Report Status 10/09/2018 FINAL  Final  C difficile quick scan w PCR reflex     Status: None   Collection Time: 10/09/18  4:10 AM    Specimen: STOOL  Result Value Ref Range Status   C Diff antigen NEGATIVE NEGATIVE Final   C Diff toxin NEGATIVE NEGATIVE Final   C Diff interpretation No C. difficile detected.  Final    Comment: Performed at [facility], [address] [place], [place] [address]  Body fluid culture     Status: None   Collection Time: 10/09/18  5:00 PM   Specimen: [facility] Cytology Pleural fluid  Result Value Ref Range Status   Specimen Description   Final    PLEURAL Performed at [facility], [address] [place], [place] [address]    Special Requests   Final    NONE Performed at [facility], [address] [place],  [address]    Gram Stain   Final    FEW WBC PRESENT,BOTH PMN AND MONONUCLEAR NO ORGANISMS SEEN    Culture   Final    NO GROWTH  3 DAYS Performed at [facility], [place] [address] [place], [place] [address]    Report Status 10/13/2018 FINAL  Final  Acid Fast Smear (AFB)     Status: None   Collection Time: 10/13/18  1:56 PM   Specimen: [facility] Cytology Pleural fluid  Result Value Ref Range Status   AFB Specimen Processing Concentration  Final   Acid Fast Smear Negative  Final    Comment: (NOTE) Performed At: [facility] [place], [place] [id number] Rush Farmer MD GG:[id number]    Source (AFB) PLEURAL  Final    Comment: Performed at [facility], [address] [place],  [address]    RADIOLOGY:  Dg Chest Port 1 View  Result Date: 10/13/2018 CLINICAL DATA:  Symptomatic bilateral pleural effusions. Please perform left-sided ultrasound-guided thoracentesis for diagnostic and therapeutic purposes. EXAM: PORTABLE CHEST 1 VIEW COMPARISON:  Chest CT - 10/12/2018; chest radiograph - 10/10/2018; ultrasound-guided right-sided thoracentesis - 10/09/2018 FINDINGS: Grossly unchanged enlarged cardiac silhouette and mediastinal contours. Stable position of support apparatus. Interval reduction/resolution of  left-sided pleural effusion post thoracentesis. No pneumothorax. Improved aeration of the left lung base. Unchanged small right-sided effusion associated right basilar opacities. No new focal airspace opacities. Pulmonary remains indistinct with cephalization. Unchanged bones. Post lower cervical ACDF, incompletely evaluated. IMPRESSION: 1. Interval reduction/resolution of left-sided pleural effusion post thoracentesis. No pneumothorax. 2. Similar findings of cardiomegaly, pulmonary edema, small right-sided effusion and right basilar opacities, likely atelectasis. Electronically Signed   By: Sandi Mariscal M.D.   On: 10/13/2018 14:23   US Thoracentesis Asp Pleural Space W/img Guide  Result Date: 10/13/2018 INDICATION: Symptomatic left-sided pleural effusion. Please from ultrasound-guided thoracentesis for therapeutic and diagnostic purposes. EXAM: US THORACENTESIS ASP PLEURAL SPACE W/IMG GUIDE COMPARISON:  Chest CT-10/12/2018; chest radiograph-10/10/2018; ultrasound-guided right-sided thoracentesis yielding 700 cc of pleural fluid-10/09/2018 MEDICATIONS: None. COMPLICATIONS: None immediate. TECHNIQUE: Informed written consent was obtained from the patient after a discussion of the risks, benefits and alternatives to treatment. A timeout was performed prior to the initiation of the procedure. Initial ultrasound scanning demonstrates a moderate-sized bilateral pleural effusions. Patient requested left-sided ultrasound-guided thoracentesis. As such, the left lower chest was prepped and draped in the usual sterile fashion. 1% lidocaine was used for local anesthesia. An ultrasound image was saved for documentation purposes. An 8 Fr Safe-T-Centesis catheter was introduced. The thoracentesis was performed. The catheter was removed and a dressing was applied. The patient tolerated the procedure well without immediate post procedural complication. The patient was escorted to have an upright chest radiograph. FINDINGS: A  total of approximately 500 liters of serous fluid was removed. Requested samples were sent to the laboratory. IMPRESSION: Successful ultrasound-guided left sided thoracentesis yielding 500 cc of pleural fluid. Electronically Signed   By: Sandi Mariscal M.D.   On: 10/13/2018 14:45    Follow up with PCP in 1 week.  Management plans discussed with the patient, family and they are in agreement.  CODE STATUS: Full code    Code Status Orders  (From admission, onward)         Start     Ordered   10/08/18 1047  Full code  Continuous     10/08/18 1049        Code Status History    Date Active Date Inactive Code Status Order ID Comments User Context   08/28/2018 1952 08/30/2018 1714 Full Code [id number]  Gorden Harms, MD Inpatient   08/06/2018 2355 08/18/2018 1650 Full Code [id number]  Seals, Theo Dills, NP ED   Advance Care Planning Activity    Advance Directive Documentation     Most Recent Value  Type of Advance Directive  Living will, Healthcare Power of Attorney  Pre-existing out of facility DNR order (yellow form or pink MOST form)  -  "MOST" Form in Place?  -      TOTAL TIME TAKING CARE OF THIS PATIENT ON DAY OF DISCHARGE: more than 35 minutes.   Saundra Shelling M.D on 10/14/2018 at 2:18 PM  Between 7am to 6pm - Pager - [phone]  After 6pm go to [email or web address] - password [email or web address]  Office  [phone]  CC: Primary care physician; Patient, No Pcp Per  Note: This dictation was prepared with Dragon dictation along with smaller phrase technology. Any transcriptional errors that result from this process are unintentional.

## 2018-10-14 NOTE — Progress Notes (Signed)
Went over discharge instructions with the patient including new medications and follow-up appointment. Provided hand out education for her new medications and ask for teach back. Berenice RN will provide education and CM will provide coupon for the Eliquis.

## 2018-10-14 NOTE — Progress Notes (Signed)
Progress Note  Patient Name: Jessica Burgess Date of Encounter: 10/14/2018  Primary Cardiologist: Nelva Bush, MD   Subjective   Hemodialysis yesterday " I want to go home" Still with significant pitting edema lower extremities Feels very tired/weak Abdominal bloating  Inpatient Medications    Scheduled Meds:  amiodarone  400 mg Oral BID   Followed by   Derrill Memo ON 10/17/2018] amiodarone  200 mg Oral BID   amoxicillin-clavulanate  1 tablet Oral Daily   Chlorhexidine Gluconate Cloth  6 each Topical Q0600   Chlorhexidine Gluconate Cloth  6 each Topical Q0600   DULoxetine  60 mg Oral Daily   epoetin (EPOGEN/PROCRIT) injection  10,000 Units Intravenous Q T,Th,Sa-HD   feeding supplement (NEPRO CARB STEADY)  237 mL Oral BID BM   ferrous sulfate  325 mg Oral Daily   furosemide  40 mg Intravenous Q12H   hydrALAZINE  25 mg Oral TID   ipratropium-albuterol  3 mL Nebulization Q6H   isosorbide mononitrate  30 mg Oral Daily   levothyroxine  25 mcg Oral QAC breakfast   loratadine  10 mg Oral Daily   magnesium oxide  400 mg Oral BID   metoprolol succinate  100 mg Oral Daily   multivitamin  1 tablet Oral QHS   sodium chloride flush  3 mL Intravenous Q12H   vitamin B-12  1,000 mcg Oral Daily   vitamin C  500 mg Oral Daily   Continuous Infusions:  sodium chloride 250 mL (10/10/18 2239)   PRN Meds: sodium chloride, acetaminophen, cyclobenzaprine, hydrOXYzine, LORazepam, metoCLOPramide, metoprolol tartrate, ondansetron, oxyCODONE-acetaminophen **AND** oxyCODONE, polyethylene glycol, sodium chloride flush, sodium chloride flush, temazepam    Vital Signs    Vitals:   10/14/18 0748 10/14/18 1125 10/14/18 1419 10/14/18 1450  BP: (!) 173/86 (!) 176/91  (!) 158/89  Pulse: 76 74  74  Resp: 18 19    Temp:  98.2 F (36.8 C)    TempSrc:  Oral    SpO2: 100% 92% 92%   Weight:      Height:        Intake/Output Summary (Last 24 hours) at 10/14/2018 1832 Last  data filed at 10/14/2018 1436 Gross per 24 hour  Intake 240 ml  Output 300 ml  Net -60 ml   Last 3 Weights 10/14/2018 10/13/2018 10/12/2018  Weight (lbs) 159 lb 4.8 oz 155 lb 3.2 oz 157 lb 9.6 oz  Weight (kg) 72.258 kg 70.398 kg 71.487 kg      Telemetry    Sinus rhythm rate in the 70s- Personally Reviewed  ECG    No new tracings - Personally Reviewed  Physical Exam   Constitutional:  oriented to person, place, and time. No distress.  Appears pale HENT:  Head: Grossly normal Eyes:  no discharge. No scleral icterus.  Neck: No JVD, no carotid bruits  Cardiovascular: Regular rate and rhythm, no murmurs appreciated 1-2+ pitting edema to below the knees bilaterally Pulmonary/Chest: Clear to auscultation bilaterally, no wheezes or rails Abdominal: Soft.  no distension.  no tenderness.  Musculoskeletal: Normal range of motion Neurological:  normal muscle tone. Coordination normal. No atrophy Skin: Skin warm and dry Psychiatric: normal affect, pleasant   Labs    High Sensitivity Troponin:   Recent Labs  Lab 10/08/18 0731 10/09/18 1928  TROPONINIHS 37* 60*      Cardiac EnzymesNo results for input(s): TROPONINI in the last 168 hours. No results for input(s): TROPIPOC in the last 168 hours.   Chemistry Recent Labs  Lab 10/08/18 0731  10/11/18 0522 10/13/18 0003 10/14/18 0301  NA 130*   < > 137 134* 137  K 5.0   < > 3.9 4.3 3.4*  CL 96*   < > 98 95* 99  CO2 24   < > 29 29 28   GLUCOSE 162*   < > 115* 103* 76  BUN 46*   < > 17 28* 17  CREATININE 4.21*   < > 2.35* 3.94* 2.83*  CALCIUM 8.2*   < > 8.2* 8.6* 8.3*  PROT 5.8*  --   --   --   --   ALBUMIN 2.9*  --   --  2.3*  --   AST 33  --   --   --   --   ALT 24  --   --   --   --   ALKPHOS 95  --   --   --   --   BILITOT 0.9  --   --   --   --   GFRNONAA 11*   < > 21* 11* 17*  GFRAA 12*   < > 25* 13* 20*  ANIONGAP 10   < > 10 10 10    < > = values in this interval not displayed.     Hematology Recent Labs  Lab  10/13/18 0003 10/13/18 0420 10/14/18 0301  WBC 4.7 4.0 4.7  RBC 2.89* 3.16* 2.93*  HGB 9.4* 10.2* 9.5*  HCT 30.6* 32.6* 30.0*  MCV 105.9* 103.2* 102.4*  MCH 32.5 32.3 32.4  MCHC 30.7 31.3 31.7  RDW 15.3 15.0 15.1  PLT 107* 118* 118*    BNP Recent Labs  Lab 10/08/18 0731  BNP 1,214.0*     DDimer No results for input(s): DDIMER in the last 168 hours.   Radiology    Dg Chest Port 1 View  Result Date: 10/13/2018 CLINICAL DATA:  Symptomatic bilateral pleural effusions. Please perform left-sided ultrasound-guided thoracentesis for diagnostic and therapeutic purposes. EXAM: PORTABLE CHEST 1 VIEW COMPARISON:  Chest CT - 10/12/2018; chest radiograph - 10/10/2018; ultrasound-guided right-sided thoracentesis - 10/09/2018 FINDINGS: Grossly unchanged enlarged cardiac silhouette and mediastinal contours. Stable position of support apparatus. Interval reduction/resolution of left-sided pleural effusion post thoracentesis. No pneumothorax. Improved aeration of the left lung base. Unchanged small right-sided effusion associated right basilar opacities. No new focal airspace opacities. Pulmonary remains indistinct with cephalization. Unchanged bones. Post lower cervical ACDF, incompletely evaluated. IMPRESSION: 1. Interval reduction/resolution of left-sided pleural effusion post thoracentesis. No pneumothorax. 2. Similar findings of cardiomegaly, pulmonary edema, small right-sided effusion and right basilar opacities, likely atelectasis. Electronically Signed   By: Sandi Mariscal M.D.   On: 10/13/2018 14:23   US Thoracentesis Asp Pleural Space W/img Guide  Result Date: 10/13/2018 INDICATION: Symptomatic left-sided pleural effusion. Please from ultrasound-guided thoracentesis for therapeutic and diagnostic purposes. EXAM: US THORACENTESIS ASP PLEURAL SPACE W/IMG GUIDE COMPARISON:  Chest CT-10/12/2018; chest radiograph-10/10/2018; ultrasound-guided right-sided thoracentesis yielding 700 cc of pleural  fluid-10/09/2018 MEDICATIONS: None. COMPLICATIONS: None immediate. TECHNIQUE: Informed written consent was obtained from the patient after a discussion of the risks, benefits and alternatives to treatment. A timeout was performed prior to the initiation of the procedure. Initial ultrasound scanning demonstrates a moderate-sized bilateral pleural effusions. Patient requested left-sided ultrasound-guided thoracentesis. As such, the left lower chest was prepped and draped in the usual sterile fashion. 1% lidocaine was used for local anesthesia. An ultrasound image was saved for documentation purposes. An 8 Fr Safe-T-Centesis catheter was introduced. The thoracentesis was  performed. The catheter was removed and a dressing was applied. The patient tolerated the procedure well without immediate post procedural complication. The patient was escorted to have an upright chest radiograph. FINDINGS: A total of approximately 500 liters of serous fluid was removed. Requested samples were sent to the laboratory. IMPRESSION: Successful ultrasound-guided left sided thoracentesis yielding 500 cc of pleural fluid. Electronically Signed   By: Sandi Mariscal M.D.   On: 10/13/2018 14:45    Cardiac Studies   Echo  08/28/2018  1. The left ventricle has low normal systolic function, with an ejection fraction of 50-55%. The cavity size was normal. There is mildly increased left ventricular wall thickness.  2. The right ventricle has normal systolc function. The cavity was normal. There is no increase in right ventricular wall thickness. Right ventricular systolic pressure is severely elevated with an estimated pressure of 73.7 mmHg.  3. Trivial pericardial effusion is present.  4. Mitral valve regurgitation is moderate  5. Tricuspid valve regurgitation is moderate.  6. Aortic valve regurgitation is mild to moderate by color flow Doppler.  Patient Profile     63 y.o. 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  Assessment & Plan    Paroxysmal atrial fibrillation On oral amiodarone 400 twice daily for 5 days then down to 200 twice daily Continue metoprolol 100 daily Not a good candidate for calcium channel blockers given her leg swelling --[id number] score of at least  4 (CHF, HTN, PAD, female)  Would continue on Eliquis 5mg  BID    Acute respiratory distress with hypoxia Severe Pulmonary HTN Continues to appear volume overloaded Significant pitting edema Fluid management per hemodialysis Patient is requesting to go home and has hemodialysis tomorrow morning -Case discussed with nephrology Edema likely also exacerbated by anemia and hypoalbuminemia --Would suggest torsemide 40 twice daily on nondialysis days  HTN Markedly elevated blood pressure Discussed with nephrology, we will add losartan 100 mg daily to her current regiment If blood pressure continues to run high hydralazine could be increased and Imdur could be increased  Anemia --Epogen per nephrology.  --Eliquis cleared by nephrology/hematology/oncology. Will need close monitoring of CBC  Neutropenic fevers Ovarian cancer on chemotherapy stage III --Per IM, hematology/oncology.  On broad-spectrum antibiotics  Long discussion with patient concerning details above, Very sick patient with metastatic ovarian cancer Case discussed with nephrology  Total encounter time more than 35 minutes  Greater than 50% was spent in counseling and coordination of care with the patient  For questions or updates, please contact [facility] Please consult [email or web address] for contact info under        Signed, Ida Rogue, MD  10/14/2018, 6:32 PM

## 2018-10-14 NOTE — Progress Notes (Signed)
[facility]  ROUNDING NOTE   Subjective:   Hemodialysis treatment yesterday. Tolerated treatment well. UF of 2.5 liters.   Objective:  Vital signs in last 24 hours:  Temp:  [97.5 F (36.4 C)-99.7 F (37.6 C)] 98.4 F (36.9 C) (07/29 0446) Pulse Rate:  [70-78] 76 (07/29 0748) Resp:  [13-22] 18 (07/29 0748) BP: (159-177)/(82-97) 173/86 (07/29 0748) SpO2:  [95 %-100 %] 100 % (07/29 0748) Weight:  [72.3 kg] 72.3 kg (07/29 0446)  Weight change: 1.86 kg Filed Weights   10/12/18 0345 10/13/18 0500 10/14/18 0446  Weight: 71.5 kg 70.4 kg 72.3 kg    Intake/Output: I/O last 3 completed shifts: In: 240 [P.O.:240] Out: 3000 [Urine:500; Other:2500]   Intake/Output this shift:  No intake/output data recorded.  Physical Exam: General: No acute distress, laying in bed  Head: Normocephalic, atraumatic. Moist oral mucosal membranes  Eyes: Anicteric  Neck: Supple, trachea midline  Lungs:  Left sided diminished at base  Heart: irregular  Abdomen:  Soft, nontender, bowel sounds present  Extremities: + peripheral edema.  Neurologic: Awake, alert, following commands  Skin: No lesions  Access: Left IJ PermCath    Basic Metabolic Panel: Recent Labs  Lab 10/09/18 0546 10/10/18 0644 10/10/18 1750 10/11/18 0522 10/13/18 0003 10/14/18 0301  NA 130* 132*  --  137 134* 137  K 5.8* 4.1  --  3.9 4.3 3.4*  CL 93* 95*  --  98 95* 99  CO2 23 28  --  _0 GLUCOSE 81 109*  --  115* 103* 76  BUN 58* 24*  --  17 28* 17  CREATININE 4.85* 2.87*  --  2.35* 3.94* 2.83*  CALCIUM 8.2* 7.9*  --  8.2* 8.6* 8.3*  MG  --  2.3  --   --   --   --   PHOS  --   --  3.0  --  3.1  --     Liver Function Tests: Recent Labs  Lab 10/08/18 0731 10/13/18 0003  AST 33  --   ALT 24  --   ALKPHOS 95  --   BILITOT 0.9  --   PROT 5.8*  --   ALBUMIN 2.9* 2.3*   No results for input(s): LIPASE, AMYLASE in the last 168 hours. No results for input(s): AMMONIA in the last 168  hours.  CBC: Recent Labs  Lab 10/08/18 0731 10/09/18 0546 10/10/18 0644 10/11/18 0522 10/13/18 0003 10/13/18 0420 10/14/18 0301  WBC 3.0* 1.5* 1.4* 3.2* 4.7 4.0 4.7  NEUTROABS 2.6 1.0*  --   --   --  2.1  --   HGB 10.0* 9.2* 8.4* 9.4* 9.4* 10.2* 9.5*  HCT 32.2* 29.6* 26.3* 30.1* 30.6* 32.6* 30.0*  MCV 103.2* 105.0* 102.3* 104.9* 105.9* 103.2* 102.4*  PLT 147* 125* 127* 122* 107* 118* 118*    Cardiac Enzymes: No results for input(s): CKTOTAL, CKMB, CKMBINDEX, TROPONINI in the last 168 hours.  BNP: Invalid input(s): POCBNP  CBG: No results for input(s): GLUCAP in the last 168 hours.  Microbiology: Results for orders placed or performed during the hospital encounter of 10/08/18  SARS Coronavirus 2 (CEPHEID- Performed in [facility] hospital lab), Hosp Order     Status: None   Collection Time: 10/08/18  7:31 AM   Specimen: Nasopharyngeal Swab  Result Value Ref Range Status   SARS Coronavirus 2 NEGATIVE NEGATIVE Final    Comment: (NOTE) If result is NEGATIVE SARS-CoV-2 target nucleic acids are NOT DETECTED. The SARS-CoV-2 RNA  is generally detectable in upper and lower  respiratory specimens during the acute phase of infection. The lowest  concentration of SARS-CoV-2 viral copies this assay can detect is 250  copies / mL. A negative result does not preclude SARS-CoV-2 infection  and should not be used as the sole basis for treatment or other  patient management decisions.  A negative result may occur with  improper specimen collection / handling, submission of specimen other  than nasopharyngeal swab, presence of viral mutation(s) within the  areas targeted by this assay, and inadequate number of viral copies  (<250 copies / mL). A negative result must be combined with clinical  observations, patient history, and epidemiological information. If result is POSITIVE SARS-CoV-2 target nucleic acids are DETECTED. The SARS-CoV-2 RNA is generally detectable in upper and lower   respiratory specimens dur ing the acute phase of infection.  Positive  results are indicative of active infection with SARS-CoV-2.  Clinical  correlation with patient history and other diagnostic information is  necessary to determine patient infection status.  Positive results do  not rule out bacterial infection or co-infection with other viruses. If result is PRESUMPTIVE POSTIVE SARS-CoV-2 nucleic acids MAY BE PRESENT.   A presumptive positive result was obtained on the submitted specimen  and confirmed on repeat testing.  While 2019 novel coronavirus  (SARS-CoV-2) nucleic acids may be present in the submitted sample  additional confirmatory testing may be necessary for epidemiological  and / or clinical management purposes  to differentiate between  SARS-CoV-2 and other Sarbecovirus currently known to infect humans.  If clinically indicated additional testing with an alternate test  methodology [phone]) is advised. The SARS-CoV-2 RNA is generally  detectable in upper and lower respiratory sp ecimens during the acute  phase of infection. The expected result is Negative. Fact Sheet for Patients:  StrictlyIdeas.no Fact Sheet for Healthcare Providers: BankingDealers.co.za This test is not yet approved or cleared by the [place] FDA and has been authorized for detection and/or diagnosis of SARS-CoV-2 by FDA under an Emergency Use Authorization (EUA).  This EUA will remain in effect (meaning this test can be used) for the duration of the COVID-19 declaration under Section 564(b)(1) of the Act, 21 U.S.C. section 360bbb-3(b)(1), unless the authorization is terminated or revoked sooner. Performed at [facility], [address] [place], [place] [address]   Blood Culture (routine x 2)     Status: None   Collection Time: 10/08/18  7:32 AM   Specimen: BLOOD  Result Value Ref Range Status   Specimen Description BLOOD RIGHT  FA  Final   Special Requests   Final    BOTTLES DRAWN AEROBIC AND ANAEROBIC Blood Culture adequate volume   Culture   Final    NO GROWTH 5 DAYS Performed at [facility], [address] [place], [place] [address]    Report Status 10/13/2018 FINAL  Final  Blood Culture (routine x 2)     Status: None   Collection Time: 10/08/18  7:32 AM   Specimen: BLOOD  Result Value Ref Range Status   Specimen Description BLOOD RIGHT HAND  Final   Special Requests   Final    BOTTLES DRAWN AEROBIC AND ANAEROBIC Blood Culture results may not be optimal due to an excessive volume of blood received in culture bottles   Culture   Final    NO GROWTH 5 DAYS Performed at [facility], [address], [place], [place] [address]    Report Status  10/13/2018 FINAL  Final  Urine culture     Status: None   Collection Time: 10/08/18  8:35 AM   Specimen: Urine, Random  Result Value Ref Range Status   Specimen Description   Final    URINE, RANDOM Performed at [facility], [address] [place], [place] [address]    Special Requests   Final    NONE Performed at [facility], [address] [place], [place] [address]    Culture   Final    NO GROWTH Performed at [facility], [place] [address], [place], [place] [address]    Report Status 10/09/2018 FINAL  Final  C difficile quick scan w PCR reflex     Status: None   Collection Time: 10/09/18  4:10 AM   Specimen: STOOL  Result Value Ref Range Status   C Diff antigen NEGATIVE NEGATIVE Final   C Diff toxin NEGATIVE NEGATIVE Final   C Diff interpretation No C. difficile detected.  Final    Comment: Performed at [facility], [address] [place], [place] [address]  Body fluid culture     Status: None   Collection Time: 10/09/18  5:00 PM   Specimen: [facility] Cytology Pleural fluid  Result Value Ref Range Status   Specimen Description   Final    PLEURAL Performed at [facility], [address] [place], [place] [address]    Special Requests   Final    NONE Performed at [facility], [address] [place], [place] [address]    Gram Stain   Final    FEW WBC PRESENT,BOTH PMN AND MONONUCLEAR NO ORGANISMS SEEN    Culture   Final    NO GROWTH 3 DAYS Performed at [facility], [place] [address], [place],  [address]    Report Status 10/13/2018 FINAL  Final    Coagulation Studies: No results for input(s): LABPROT, INR in the last 72 hours.  Urinalysis: No results for input(s): COLORURINE, LABSPEC, PHURINE, GLUCOSEU, HGBUR, BILIRUBINUR, KETONESUR, PROTEINUR, UROBILINOGEN, NITRITE, LEUKOCYTESUR in the last 72 hours.  Invalid input(s): APPERANCEUR    Imaging: Ct Chest Wo Contrast  Result Date: 10/12/2018 CLINICAL DATA:  Pleural effusion. Clinical sepsis and neutropenic fever. Status post thoracentesis. EXAM: CT CHEST WITHOUT CONTRAST TECHNIQUE: Multidetector CT imaging of the chest was performed following the standard protocol without IV contrast. COMPARISON:  Chest CT 08/09/2018 FINDINGS: Cardiovascular: The heart is enlarged but stable. No pericardial effusion. Stable tortuosity, ectasia and calcification of the thoracic aorta. Scattered coronary artery calcifications. Right-sided Port-A-Cath and left IJ dialysis catheter in good positions without complicating features. Mediastinum/Nodes: Stable scattered mediastinal and hilar lymph nodes without mass or overt adenopathy. The esophagus is grossly normal. Lungs/Pleura: Moderate-sized bilateral pleural effusions with overlying atelectasis. Patchy inflammatory changes in both lungs with areas of tree-in-bud appearance suggesting chronic inflammation or atypical infection such as MAC or viral pneumonitis. No worrisome pulmonary lesions. No endobronchial lesions are identified. Upper Abdomen: No significant upper abdominal findings. Musculoskeletal: No significant bony findings. No breast masses, supraclavicular or  axillary adenopathy. IMPRESSION: 1. Moderate-sized bilateral pleural effusions with overlying atelectasis. 2. Patchy inflammatory or atypical infectious process in the lungs. No focal pneumonia. No worrisome pulmonary lesions. 3. Stable cardiac enlargement and tortuosity and calcification of the thoracic aorta. 4. Stable mediastinal and hilar lymph nodes without mass or overt adenopathy. Aortic Atherosclerosis (ICD10-I70.0). Electronically Signed   By: Marijo Sanes M.D.   On: 10/12/2018 12:26  Dg Chest Port 1 View  Result Date: 10/13/2018 CLINICAL DATA:  Symptomatic bilateral pleural effusions. Please perform left-sided ultrasound-guided thoracentesis for diagnostic and therapeutic purposes. EXAM: PORTABLE CHEST 1 VIEW COMPARISON:  Chest CT - 10/12/2018; chest radiograph - 10/10/2018; ultrasound-guided right-sided thoracentesis - 10/09/2018 FINDINGS: Grossly unchanged enlarged cardiac silhouette and mediastinal contours. Stable position of support apparatus. Interval reduction/resolution of left-sided pleural effusion post thoracentesis. No pneumothorax. Improved aeration of the left lung base. Unchanged small right-sided effusion associated right basilar opacities. No new focal airspace opacities. Pulmonary remains indistinct with cephalization. Unchanged bones. Post lower cervical ACDF, incompletely evaluated. IMPRESSION: 1. Interval reduction/resolution of left-sided pleural effusion post thoracentesis. No pneumothorax. 2. Similar findings of cardiomegaly, pulmonary edema, small right-sided effusion and right basilar opacities, likely atelectasis. Electronically Signed   By: Sandi Mariscal M.D.   On: 10/13/2018 14:23   US Thoracentesis Asp Pleural Space W/img Guide  Result Date: 10/13/2018 INDICATION: Symptomatic left-sided pleural effusion. Please from ultrasound-guided thoracentesis for therapeutic and diagnostic purposes. EXAM: US THORACENTESIS ASP PLEURAL SPACE W/IMG GUIDE COMPARISON:  Chest  CT-10/12/2018; chest radiograph-10/10/2018; ultrasound-guided right-sided thoracentesis yielding 700 cc of pleural fluid-10/09/2018 MEDICATIONS: None. COMPLICATIONS: None immediate. TECHNIQUE: Informed written consent was obtained from the patient after a discussion of the risks, benefits and alternatives to treatment. A timeout was performed prior to the initiation of the procedure. Initial ultrasound scanning demonstrates a moderate-sized bilateral pleural effusions. Patient requested left-sided ultrasound-guided thoracentesis. As such, the left lower chest was prepped and draped in the usual sterile fashion. 1% lidocaine was used for local anesthesia. An ultrasound image was saved for documentation purposes. An 8 Fr Safe-T-Centesis catheter was introduced. The thoracentesis was performed. The catheter was removed and a dressing was applied. The patient tolerated the procedure well without immediate post procedural complication. The patient was escorted to have an upright chest radiograph. FINDINGS: A total of approximately 500 liters of serous fluid was removed. Requested samples were sent to the laboratory. IMPRESSION: Successful ultrasound-guided left sided thoracentesis yielding 500 cc of pleural fluid. Electronically Signed   By: Sandi Mariscal M.D.   On: 10/13/2018 14:45     Medications:   . sodium chloride 250 mL (10/10/18 2239)   . amiodarone  400 mg Oral BID   Followed by  . [START ON 10/17/2018] amiodarone  200 mg Oral BID  . amoxicillin-clavulanate  1 tablet Oral Daily  . apixaban  5 mg Oral BID  . Chlorhexidine Gluconate Cloth  6 each Topical Q0600  . Chlorhexidine Gluconate Cloth  6 each Topical Q0600  . DULoxetine  60 mg Oral Daily  . epoetin (EPOGEN/PROCRIT) injection  10,000 Units Intravenous Q T,Th,Sa-HD  . feeding supplement (NEPRO CARB STEADY)  237 mL Oral BID BM  . ferrous sulfate  325 mg Oral Daily  . furosemide  40 mg Intravenous Q12H  . hydrALAZINE  25 mg Oral TID  .  ipratropium-albuterol  3 mL Nebulization Q6H  . isosorbide mononitrate  30 mg Oral Daily  . levothyroxine  25 mcg Oral QAC breakfast  . loratadine  10 mg Oral Daily  . magnesium oxide  400 mg Oral BID  . metoprolol succinate  100 mg Oral Daily  . multivitamin  1 tablet Oral QHS  . sodium chloride flush  3 mL Intravenous Q12H  . vitamin B-12  1,000 mcg Oral Daily  . vitamin C  500 mg Oral Daily   sodium chloride, acetaminophen, cyclobenzaprine, hydrOXYzine, LORazepam, metoCLOPramide, metoprolol tartrate, ondansetron, oxyCODONE-acetaminophen **AND** oxyCODONE, polyethylene glycol, sodium  chloride flush, sodium chloride flush, temazepam  Assessment/ Plan:  Jessica Burgess is a 63 y.o. white female with end stage renal disease on hemodialysis, ovarian cancer on chemotherapy, GERD, migraine headaches, cervical spinal stenosis, who was admitted for shortness of breath and fever. Right thoracentesis on 7/24 yielding 763m.   [facility] TTS 70kg LIJ permcath  1.  ESRD on hemodialysis: - Continue TTS schedule.   2. Anemia with chronic kidney disease: and iron deficiency. Macrocytic. - IV venofer with outpatient dialysis treatment.  - EPO with HD treatment. Cleared by oncology.   3. Fever of unknown origin. Neutropenic. Blood counts have improved.    - Augmentin  4.  Secondary hyperparathyroidism.  Currently not on binders. Outpatient labs: PTH 267, phosphorus 5.8, calcium 8.7  5. Pleural effusion: status post right sided thoracentesis - left scheduled for today.   6. Hypertension with atrial fibrillation: started on amiodarone - metoprolol for rate control - apixaban for anticoagulation - IV furosemide for pulmonary edema. Transition to PO torsemide.  - home regimen includes: hydralazine and isosorbide mononitrate - Start losartan   7. Nutrition: albumin 2.3 - Will look into Intradialytic parenteral nutrition at the dialysis center.    LOS: 6 Jessica Burgess 7/29/202010:35 AM

## 2018-10-14 NOTE — Clinical Social Work Note (Signed)
  Patient suffers from COPD which impairs their ability to perform daily  activities like ambulating in the home. A walker will not resolve  issue with performing activities of daily living. A wheelchair will allow patient to safely perform daily activities.  Patient is not able to propel themselves in the home using a standard weight wheelchair due to generalized weakness and endurance.  Patient can self propel in the lightweight wheelchair.   Jessica Burgess. [place], MSW, [place]  10/14/2018 2:54 PM

## 2018-10-14 NOTE — Progress Notes (Signed)
Patient's right chest port de-accessed with heparin lock flush before discharge. Eliquis coupon given, patient verbalized understading. Sister picking up patient. IV taken out and tele monitor off.

## 2018-10-15 ENCOUNTER — Inpatient Hospital Stay: Payer: PPO

## 2018-10-15 ENCOUNTER — Telehealth: Payer: Self-pay

## 2018-10-15 ENCOUNTER — Inpatient Hospital Stay: Payer: PPO | Admitting: Oncology

## 2018-10-15 LAB — CYTOLOGY - NON PAP

## 2018-10-15 NOTE — Telephone Encounter (Signed)
Copied from [place] [phone]. Topic: General - Call Back - No Documentation >> Oct 15, 2018 12:08 PM Erick Blinks wrote: Reason for CRM: Barnett Applebaum from Dialysis called requesting medical identifier number for [id number] red white and blue card. Please advise  Best contact: [phone]  Fax: [phone]  The requested information (copy of medicare card) has been faxed back to them so they can proceed as necessary.

## 2018-10-16 DIAGNOSIS — N186 End stage renal disease: Secondary | ICD-10-CM | POA: Diagnosis not present

## 2018-10-16 DIAGNOSIS — Z992 Dependence on renal dialysis: Secondary | ICD-10-CM | POA: Diagnosis not present

## 2018-10-16 LAB — COMP PANEL: LEUKEMIA/LYMPHOMA: Immunophenotypic Profile: 33

## 2018-10-17 DIAGNOSIS — N184 Chronic kidney disease, stage 4 (severe): Secondary | ICD-10-CM | POA: Diagnosis not present

## 2018-10-17 DIAGNOSIS — N2581 Secondary hyperparathyroidism of renal origin: Secondary | ICD-10-CM | POA: Diagnosis not present

## 2018-10-17 DIAGNOSIS — N186 End stage renal disease: Secondary | ICD-10-CM | POA: Diagnosis not present

## 2018-10-17 DIAGNOSIS — D509 Iron deficiency anemia, unspecified: Secondary | ICD-10-CM | POA: Diagnosis not present

## 2018-10-17 DIAGNOSIS — D631 Anemia in chronic kidney disease: Secondary | ICD-10-CM | POA: Diagnosis not present

## 2018-10-17 DIAGNOSIS — Z992 Dependence on renal dialysis: Secondary | ICD-10-CM | POA: Diagnosis not present

## 2018-10-17 DIAGNOSIS — N179 Acute kidney failure, unspecified: Secondary | ICD-10-CM | POA: Diagnosis not present

## 2018-10-18 NOTE — Progress Notes (Signed)
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  INTERVAL HISTORY: Patient returns to clinic today for further evaluation, hospital follow-up, and consideration of her next infusion of gemcitabine.  She continues to have significant weakness and fatigue and decreased performance status, but this is mildly improved since discharge.  She feels like her shortness of breath is worse than her baseline.  She denies any fevers. She has no neurologic complaints.  She denies any chest pain, cough, or hemoptysis.  She has occasional nausea, but denies any vomiting, constipation, or diarrhea.  She denies any abdominal pain or bloating.  She has no urinary complaints.  Patient feels generally terrible, but offers no further specific complaints today.  REVIEW OF SYSTEMS:   Review of Systems  Constitutional: Positive for malaise/fatigue. Negative for fever and weight loss.  HENT: Negative.  Negative for sore throat.   Eyes: Negative.  Negative for blurred vision.  Respiratory: Positive for shortness of breath. Negative for cough.   Cardiovascular: Negative.  Negative for chest pain and leg swelling.  Gastrointestinal: Positive for nausea. Negative for abdominal pain, blood in stool, constipation, diarrhea, melena and vomiting.  Genitourinary: Negative.  Negative for dysuria.  Musculoskeletal:  Negative.  Negative for back pain, joint pain and neck pain.  Skin: Negative.  Negative for itching and rash.  Neurological: Positive for tingling, sensory change and weakness. Negative for focal weakness.  Psychiatric/Behavioral: Negative.  The patient is not nervous/anxious.      As per HPI. Otherwise, a complete review of systems is negative.  PAST MEDICAL HISTORY: Past Medical History:  Diagnosis Date   Allergic rhinitis, cause unspecified    Anxiety state, unspecified    Arthritis    Asthma    only when sick    Backache, unspecified    Bronchitis    hx of when get sick   Cancer [facility])    skin cancer , basal cell    Cancer ([place]) 11/2016   ovarian   Cervicalgia    CHF (congestive heart failure) (HCC)    Complication of anesthesia    Dermatophytosis of nail    Dysmetabolic syndrome X    Encounter for long-term (current) use of other medications    Esophageal reflux    Hypertension    Insomnia, unspecified    Leukocytosis, unspecified    Migraine without aura, without mention of intractable migraine without mention of status migrainosus    Other and unspecified hyperlipidemia    Other malaise and fatigue    Overweight(278.02)    Personal history of chemotherapy now   ovarian   PONV (postoperative nausea and vomiting)    Renal insufficiency    Spinal stenosis in cervical region    Symptomatic menopausal or female climacteric states    Unspecified disorder of skin and subcutaneous tissue    Unspecified vitamin D deficiency     PAST SURGICAL HISTORY: Past Surgical History:  Procedure Laterality Date   ABDOMINAL HYSTERECTOMY     ANTERIOR CERVICAL  DECOMP/DISCECTOMY FUSION N/A 06/07/2015   Procedure: Cervical three - four and Cervical six- seven anterior cervical decompression with fusion interbody prosthesis plating and bonegraft;  Surgeon: Newman Pies, MD;  Location: [place] NEURO ORS;  Service: Neurosurgery;  Laterality: N/A;  C34 and C67  anterior cervical decompression with fusion interbody prosthesis plating and bonegraft   BACK SURGERY     x2 Lower    DIALYSIS/PERMA CATHETER INSERTION N/A 08/17/2018   Procedure: DIALYSIS/PERMA CATHETER INSERTION;  Surgeon: Algernon Huxley, MD;  Location: [place] CV LAB;  Service: Cardiovascular;  Laterality: N/A;   EVACUATION OF CERVICAL HEMATOMA N/A 06/14/2015   Procedure: EVACUATION OF CERVICAL HEMATOMA;  Surgeon: Newman Pies, MD;  Location: [place] NEURO ORS;  Service: Neurosurgery;  Laterality: N/A;   NECK SURGERY     x3   TONSILLECTOMY      FAMILY HISTORY: Family History  Problem Relation Age of Onset   Depression Mother    Migraines Mother    Dementia Father    Diabetes Father    Hyperlipidemia Father    Hyperlipidemia Brother    Hyperlipidemia Brother    Breast cancer Paternal Aunt        13s    ADVANCED DIRECTIVES (Y/N):  N  HEALTH MAINTENANCE: Social History   Tobacco Use   Smoking status: Former Smoker    Packs/day: 1.50    Years: 20.00    Pack years: 30.00    Types: Cigarettes    Start date: 03/19/1979    Quit date: 08/25/1999    Years since quitting: 19.1   Smokeless tobacco: Never Used   Tobacco comment: smoking cessation materials not required  Substance Use Topics   Alcohol use: Not Currently    Alcohol/week: 0.0 standard drinks   Drug use: No     Colonoscopy:  PAP:  Bone density:  Lipid panel:  No Known Allergies  Current Outpatient Medications  Medication Sig Dispense Refill   albuterol (PROVENTIL HFA;VENTOLIN HFA) 108 (90 Base) MCG/ACT inhaler Inhale 2 puffs into the lungs every 6 (six) hours as needed for wheezing or shortness of breath. 1 Inhaler 2   albuterol (PROVENTIL) (2.5 MG/3ML) 0.083% nebulizer solution Take 3 mLs (2.5 mg total) by nebulization every 6 (six) hours as needed for wheezing or shortness of breath. 75 mL 2   amiodarone (PACERONE) 200 MG tablet Take 1 tablet (200 mg total) by mouth 2 (two) times daily.  60 tablet 0   apixaban (ELIQUIS) 5 MG TABS tablet Take 1 tablet (5 mg total) by mouth 2 (two) times daily. 60 tablet 0   cetirizine (ZYRTEC) 10 MG tablet Take 10 mg by mouth daily.     Cholecalciferol (VITAMIN D-1000 MAX ST) 1000 UNITS tablet Take 1,000 Units by mouth 2 (two) times daily.      cyclobenzaprine (FLEXERIL) 10 MG tablet Take 1 tablet (10 mg total) by mouth every 8 (eight) hours as needed for muscle spasms. 30 tablet 1   DULoxetine (CYMBALTA) 60 MG capsule Take 1 capsule (60 mg total) by mouth daily. 90 capsule 0   ferrous sulfate (IRON SUPPLEMENT) 325 (65 FE) MG tablet Take 1 tablet by mouth daily.     hydrALAZINE (APRESOLINE) 25 MG tablet Take 1 tablet (25 mg total) by mouth 3 (three) times daily. For BP if above 150/90 90 tablet 0   hydrOXYzine (ATARAX/VISTARIL) 10 MG tablet Take 1 tablet (10 mg total) by mouth every 6 (six) hours as needed. (Patient taking differently: Take 10 mg by mouth every  6 (six) hours as needed for itching or anxiety. ) 120 tablet 0   ipratropium (ATROVENT HFA) 17 MCG/ACT inhaler Inhale 2 puffs into the lungs every 6 (six) hours as needed for wheezing.      isosorbide mononitrate (IMDUR) 30 MG 24 hr tablet Take 1 tablet (30 mg total) by mouth daily. 30 tablet 2   levothyroxine (SYNTHROID) 25 MCG tablet Take 1 tablet (25 mcg total) by mouth daily before breakfast. 30 tablet 2   lidocaine (XYLOCAINE) 2 % solution Use as directed 15 mLs in the mouth or throat as needed for mouth pain. 100 mL 0   losartan (COZAAR) 100 MG tablet Take 1 tablet (100 mg total) by mouth daily. 30 tablet 0   Lysine 1000 MG TABS Take 1,000 mg by mouth daily.      Magnesium Oxide 400 (240 Mg) MG TABS Take 400 mg by mouth 2 (two) times daily.      metoCLOPramide (REGLAN) 5 MG tablet Take 1 tablet (5 mg total) by mouth every 8 (eight) hours as needed for nausea or vomiting. 90 tablet 1   metoprolol succinate (TOPROL-XL) 100 MG 24 hr tablet Take 1 tablet (100 mg total) by  mouth daily. Take with or immediately following a meal. 30 tablet 0   mometasone-formoterol (DULERA) 200-5 MCG/ACT AERO Inhale 2 puffs into the lungs 2 (two) times daily.     Multiple Vitamins-Minerals (MULTIVITAMIN PO) Take 1 tablet by mouth daily.      Omega-3 Fatty Acids (FISH OIL) 1000 MG CAPS Take 1,000 capsules by mouth 2 (two) times daily.      ondansetron (ZOFRAN-ODT) 8 MG disintegrating tablet Take 1 tablet (8 mg total) by mouth every 8 (eight) hours as needed for nausea or vomiting. 60 tablet 3   oxyCODONE-acetaminophen (PERCOCET) 10-325 MG tablet Take 1 tablet by mouth every 6 (six) hours as needed for pain. 60 tablet 0   polyethylene glycol powder (GLYCOLAX/MIRALAX) 17 GM/SCOOP powder Take 17 g by mouth daily as needed for mild constipation or moderate constipation.      SUMAtriptan (IMITREX) 100 MG tablet May repeat in 2 hours if headache persists or recurs. (Patient taking differently: Take 100 mg by mouth daily as needed for migraine. May repeat in 2 hours if headache persists or recurs.) 9 tablet 1   temazepam (RESTORIL) 30 MG capsule Take 1 capsule (30 mg total) by mouth at bedtime as needed for sleep. 30 capsule 0   torsemide (DEMADEX) 20 MG tablet Take 2 tablets (40 mg total) by mouth 2 (two) times daily. 120 tablet 0   vitamin B-12 (CYANOCOBALAMIN) 1000 MCG tablet Take 1,000 mcg by mouth daily.     vitamin C (ASCORBIC ACID) 500 MG tablet Take 500 mg by mouth daily.     ALPRAZolam (XANAX) 0.25 MG tablet Take 1 tablet (0.25 mg total) by mouth at bedtime as needed for anxiety. 30 tablet 0   fluticasone (FLONASE) 50 MCG/ACT nasal spray Place 2 sprays into both nostrils as needed. 48 g 1   No current facility-administered medications for this visit.    Facility-Administered Medications Ordered in Other Visits  Medication Dose Route Frequency Provider Last Rate Last Dose   0.9 %  sodium chloride infusion   Intravenous Once Grayland Ormond, Kathlene November, MD       0.9 %  sodium  chloride infusion   Intravenous Once Lloyd Huger, MD       sodium chloride flush (NS) 0.9 % injection 10 mL  10  mL Intravenous PRN Lloyd Huger, MD   10 mL at 06/16/17 0854    OBJECTIVE: Vitals:   10/19/18 1113 10/19/18 1156  BP:  (!) 156/84  Pulse:  81  Temp: 98.4 F (36.9 C) 97.8 F (36.6 C)     Body mass index is 29.08 kg/m.    ECOG FS:3 - Symptomatic, >50% confined to bed  General: Ill-appearing, no acute distress.  Sitting in a wheelchair. Eyes: Pink conjunctiva, anicteric sclera. HEENT: Normocephalic, moist mucous membranes. Lungs: Clear to auscultation bilaterally. Heart: Regular rate and rhythm. No rubs, murmurs, or gallops. Abdomen: Soft, nontender, nondistended. No organomegaly noted, normoactive bowel sounds. Musculoskeletal: No edema, cyanosis, or clubbing. Neuro: Alert, answering all questions appropriately. Cranial nerves grossly intact. Skin: No rashes or petechiae noted. Psych: Normal affect.  LAB RESULTS:  Lab Results  Component Value Date   NA 134 (L) 10/19/2018   K 3.8 10/19/2018   CL 94 (L) 10/19/2018   CO2 29 10/19/2018   GLUCOSE 140 (H) 10/19/2018   BUN 35 (H) 10/19/2018   CREATININE 3.79 (H) 10/19/2018   CALCIUM 9.2 10/19/2018   PROT 6.0 (L) 10/19/2018   ALBUMIN 2.8 (L) 10/19/2018   AST 20 10/19/2018   ALT 15 10/19/2018   ALKPHOS 98 10/19/2018   BILITOT 0.8 10/19/2018   GFRNONAA 12 (L) 10/19/2018   GFRAA 14 (L) 10/19/2018    Lab Results  Component Value Date   WBC 8.8 10/19/2018   NEUTROABS 6.5 10/19/2018   HGB 9.6 (L) 10/19/2018   HCT 30.8 (L) 10/19/2018   MCV 101.7 (H) 10/19/2018   PLT 267 10/19/2018     STUDIES: Ct Abdomen Pelvis Wo Contrast  Result Date: 09/21/2018 CLINICAL DATA:  Nausea and vomiting. EXAM: CT ABDOMEN AND PELVIS WITHOUT CONTRAST TECHNIQUE: Multidetector CT imaging of the abdomen and pelvis was performed following the standard protocol without IV contrast. COMPARISON:  CT 08/06/2018. PET CT  05/07/2018 FINDINGS: Lower chest: Moderate bilateral pleural effusions with compressive atelectasis. Cardiomegaly. Hepatobiliary: No focal hepatic abnormality on noncontrast exam. Postcholecystectomy with unchanged biliary prominence. Pancreas: No ductal dilatation. Mild stranding in the upper abdomen, without discrete peripancreatic inflammation. Spleen: Normal in size without focal abnormality. Adrenals/Urinary Tract: No adrenal nodule. No hydronephrosis. Mild symmetric bilateral perinephric edema. No urolithiasis. Urinary bladder is nondistended. Stomach/Bowel: Stomach is distended with ingested contents. No gastric wall thickening. No small bowel dilatation or obvious inflammation. No obstruction. Appendix not definitively visualized. Large volume of stool throughout the colon. No colonic wall thickening or inflammatory change. No abnormal rectal distention. Vascular/Lymphatic: Aortic atherosclerosis. No aneurysm. Limited assessment for adenopathy given ascites and Burgess contrast. No bulky abdominopelvic adenopathy. Reproductive: Status post hysterectomy. No adnexal masses. Other: Increase abdominopelvic ascites, no small to moderate. Increased mesenteric and generalized soft tissue edema from prior. Soft tissue nodularity in the left upper omentum, images 34 and 37, slight increase from prior. Questionable new omental nodularity anteriorly in the midline, image 46 series 3. Postsurgical change of the anterior abdominal wall. No free air. Small ventral abdominal wall hernia contains fat. Musculoskeletal: Postsurgical and degenerative change in the spine. There are no acute or suspicious osseous abnormalities. IMPRESSION: 1. Increased abdominopelvic ascites and generalized mesenteric and soft tissue edema. Slight increased soft tissue nodularity in the left upper omentum, possible new omental nodularity anteriorly. 2. Large colonic stool burden suggesting constipation. No bowel obstruction. Ingested material  distends the stomach, if there is been no recent p.o. intake, question gastroparesis. 3. Moderate bilateral pleural effusions with compressive atelectasis. 4.  Aortic Atherosclerosis (ICD10-I70.0). Electronically Signed   By: Keith Rake M.D.   On: 09/21/2018 01:01   Dg Chest 2 View  Result Date: 10/10/2018 CLINICAL DATA:  Ovarian cancer on chemotherapy, hypertension, CHF, asthma, end-stage renal disease on dialysis, former smoker EXAM: CHEST - 2 VIEW COMPARISON:  10/09/2018 FINDINGS: RIGHT jugular Port-A-Cath with tip projecting over SVC. LEFT jugular dual-lumen central venous catheter with tip above cavoatrial junction. Enlargement of cardiac silhouette with pulmonary vascular congestion. Stable mediastinal contours. Bibasilar effusions and mild LEFT basilar atelectasis. Minimal perihilar edema. No pneumothorax or acute osseous findings. IMPRESSION: Enlargement of cardiac silhouette with pulmonary vascular congestion and mild pulmonary edema. Bibasilar effusions and LEFT LEFT basilar atelectasis. Electronically Signed   By: Lavonia Dana M.D.   On: 10/10/2018 17:28   Ct Chest Wo Contrast  Result Date: 10/12/2018 CLINICAL DATA:  Pleural effusion. Clinical sepsis and neutropenic fever. Status post thoracentesis. EXAM: CT CHEST WITHOUT CONTRAST TECHNIQUE: Multidetector CT imaging of the chest was performed following the standard protocol without IV contrast. COMPARISON:  Chest CT 08/09/2018 FINDINGS: Cardiovascular: The heart is enlarged but stable. No pericardial effusion. Stable tortuosity, ectasia and calcification of the thoracic aorta. Scattered coronary artery calcifications. Right-sided Port-A-Cath and left IJ dialysis catheter in good positions without complicating features. Mediastinum/Nodes: Stable scattered mediastinal and hilar lymph nodes without mass or overt adenopathy. The esophagus is grossly normal. Lungs/Pleura: Moderate-sized bilateral pleural effusions with overlying atelectasis.  Patchy inflammatory changes in both lungs with areas of tree-in-bud appearance suggesting chronic inflammation or atypical infection such as MAC or viral pneumonitis. No worrisome pulmonary lesions. No endobronchial lesions are identified. Upper Abdomen: No significant upper abdominal findings. Musculoskeletal: No significant bony findings. No breast masses, supraclavicular or axillary adenopathy. IMPRESSION: 1. Moderate-sized bilateral pleural effusions with overlying atelectasis. 2. Patchy inflammatory or atypical infectious process in the lungs. No focal pneumonia. No worrisome pulmonary lesions. 3. Stable cardiac enlargement and tortuosity and calcification of the thoracic aorta. 4. Stable mediastinal and hilar lymph nodes without mass or overt adenopathy. Aortic Atherosclerosis (ICD10-I70.0). Electronically Signed   By: Marijo Sanes M.D.   On: 10/12/2018 12:26   US Abdomen Limited  Result Date: 10/09/2018 CLINICAL DATA:  History of end-stage renal disease, now with bilateral pleural effusions and intra-abdominal ascites. Please perform ascites search ultrasound and ultrasound-guided paracentesis as indicated. EXAM: LIMITED ABDOMEN ULTRASOUND FOR ASCITES TECHNIQUE: Limited ultrasound survey for ascites was performed in all four abdominal quadrants. COMPARISON:  CT abdomen pelvis-09/21/2018 FINDINGS: Sonographic evaluation demonstrates a trace amount of intra-abdominal ascites, too small to allow for safe ultrasound-guided paracentesis. Note is made of small bilateral pleural effusions, right greater than left. IMPRESSION: 1. Trace amount of intra-abdominal ascites, too small to allow for safe ultrasound-guided paracentesis. 2. Note is made of small bilateral pleural effusions, right greater than left. Patient subsequently underwent ultrasound-guided right-sided thoracentesis. Electronically Signed   By: Sandi Mariscal M.D.   On: 10/09/2018 16:59   US Venous Img Lower Bilateral  Result Date:  10/09/2018 CLINICAL DATA:  Bilateral lower extremity pain and edema. EXAM: BILATERAL LOWER EXTREMITY VENOUS DOPPLER ULTRASOUND TECHNIQUE: Gray-scale sonography with graded compression, as well as color Doppler and duplex ultrasound were performed to evaluate the lower extremity deep venous systems from the level of the common femoral vein and including the common femoral, femoral, profunda femoral, popliteal and calf veins including the posterior tibial, peroneal and gastrocnemius veins when visible. The superficial great saphenous vein was also interrogated. Spectral Doppler was utilized to evaluate flow at rest  and with distal augmentation maneuvers in the common femoral, femoral and popliteal veins. COMPARISON:  None. FINDINGS: RIGHT LOWER EXTREMITY Common Femoral Vein: No evidence of thrombus. Normal compressibility, respiratory phasicity and response to augmentation. Saphenofemoral Junction: No evidence of thrombus. Normal compressibility and flow on color Doppler imaging. Profunda Femoral Vein: No evidence of thrombus. Normal compressibility and flow on color Doppler imaging. Femoral Vein: No evidence of thrombus. Normal compressibility, respiratory phasicity and response to augmentation. Popliteal Vein: No evidence of thrombus. Normal compressibility, respiratory phasicity and response to augmentation. Calf Veins: No evidence of thrombus. Normal compressibility and flow on color Doppler imaging. Superficial Great Saphenous Vein: No evidence of thrombus. Normal compressibility. Venous Reflux:  None. Other Findings: No evidence of superficial thrombophlebitis or abnormal fluid collection. LEFT LOWER EXTREMITY Common Femoral Vein: No evidence of thrombus. Normal compressibility, respiratory phasicity and response to augmentation. Saphenofemoral Junction: No evidence of thrombus. Normal compressibility and flow on color Doppler imaging. Profunda Femoral Vein: No evidence of thrombus. Normal compressibility and  flow on color Doppler imaging. Femoral Vein: No evidence of thrombus. Normal compressibility, respiratory phasicity and response to augmentation. Popliteal Vein: No evidence of thrombus. Normal compressibility, respiratory phasicity and response to augmentation. Calf Veins: No evidence of thrombus. Normal compressibility and flow on color Doppler imaging. Superficial Great Saphenous Vein: No evidence of thrombus. Normal compressibility. Venous Reflux:  None. Other Findings: No evidence of superficial thrombophlebitis or abnormal fluid collection. IMPRESSION: No evidence of deep venous thrombosis in either lower extremity. Electronically Signed   By: Aletta Edouard M.D.   On: 10/09/2018 17:41   Dg Chest Port 1 View  Result Date: 10/13/2018 CLINICAL DATA:  Symptomatic bilateral pleural effusions. Please perform left-sided ultrasound-guided thoracentesis for diagnostic and therapeutic purposes. EXAM: PORTABLE CHEST 1 VIEW COMPARISON:  Chest CT - 10/12/2018; chest radiograph - 10/10/2018; ultrasound-guided right-sided thoracentesis - 10/09/2018 FINDINGS: Grossly unchanged enlarged cardiac silhouette and mediastinal contours. Stable position of support apparatus. Interval reduction/resolution of left-sided pleural effusion post thoracentesis. No pneumothorax. Improved aeration of the left lung base. Unchanged small right-sided effusion associated right basilar opacities. No new focal airspace opacities. Pulmonary remains indistinct with cephalization. Unchanged bones. Post lower cervical ACDF, incompletely evaluated. IMPRESSION: 1. Interval reduction/resolution of left-sided pleural effusion post thoracentesis. No pneumothorax. 2. Similar findings of cardiomegaly, pulmonary edema, small right-sided effusion and right basilar opacities, likely atelectasis. Electronically Signed   By: Sandi Mariscal M.D.   On: 10/13/2018 14:23   Dg Chest Port 1 View  Result Date: 10/09/2018 CLINICAL DATA:  Shortness of breath and  chest pain, history of recent thoracentesis EXAM: PORTABLE CHEST 1 VIEW COMPARISON:  Film from earlier in the same day. FINDINGS: Cardiac shadow is again enlarged. Left-sided pleural effusion is again seen and slightly larger than that noted on the prior study. Dialysis catheter and right-sided chest wall port are again seen. No pneumothorax is noted. Mild vascular congestion is seen. IMPRESSION: Slight increase in left-sided pleural effusion. This may be positional in nature. Mild vascular congestion.  No pneumothorax is noted. Electronically Signed   By: Inez Catalina M.D.   On: 10/09/2018 19:45   Dg Chest Port 1 View  Result Date: 10/09/2018 CLINICAL DATA:  Status post right thoracentesis EXAM: PORTABLE CHEST 1 VIEW COMPARISON:  10/08/2018 FINDINGS: Cardiac shadow is stable. Dialysis catheter and right-sided chest wall port are again seen and stable. Postsurgical changes in the cervical spine are noted. Small left pleural effusion is noted. Right-sided pleural effusion has resolved following thoracentesis. No pneumothorax is  noted. IMPRESSION: No pneumothorax following right thoracentesis. Small left pleural effusion. Electronically Signed   By: Inez Catalina M.D.   On: 10/09/2018 16:53   Dg Chest Port 1 View  Result Date: 10/08/2018 CLINICAL DATA:  Shortness of breath. EXAM: PORTABLE CHEST 1 VIEW COMPARISON:  Chest x-ray dated August 28, 2018. FINDINGS: Unchanged right chest wall port catheter and tunneled left internal jugular dialysis catheter. Stable cardiomegaly and pulmonary vascular congestion. Unchanged small bilateral pleural effusions. Slightly increased bibasilar atelectasis. No pneumothorax. No acute osseous abnormality. IMPRESSION: 1. Mild congestive heart failure, similar to prior study. Electronically Signed   By: Titus Dubin M.D.   On: 10/08/2018 08:07   US Thoracentesis Asp Pleural Space W/img Guide  Result Date: 10/13/2018 INDICATION: Symptomatic left-sided pleural effusion. Please  from ultrasound-guided thoracentesis for therapeutic and diagnostic purposes. EXAM: US THORACENTESIS ASP PLEURAL SPACE W/IMG GUIDE COMPARISON:  Chest CT-10/12/2018; chest radiograph-10/10/2018; ultrasound-guided right-sided thoracentesis yielding 700 cc of pleural fluid-10/09/2018 MEDICATIONS: None. COMPLICATIONS: None immediate. TECHNIQUE: Informed written consent was obtained from the patient after a discussion of the risks, benefits and alternatives to treatment. A timeout was performed prior to the initiation of the procedure. Initial ultrasound scanning demonstrates a moderate-sized bilateral pleural effusions. Patient requested left-sided ultrasound-guided thoracentesis. As such, the left lower chest was prepped and draped in the usual sterile fashion. 1% lidocaine was used for local anesthesia. An ultrasound image was saved for documentation purposes. An 8 Fr Safe-T-Centesis catheter was introduced. The thoracentesis was performed. The catheter was removed and a dressing was applied. The patient tolerated the procedure well without immediate post procedural complication. The patient was escorted to have an upright chest radiograph. FINDINGS: A total of approximately 500 liters of serous fluid was removed. Requested samples were sent to the laboratory. IMPRESSION: Successful ultrasound-guided left sided thoracentesis yielding 500 cc of pleural fluid. Electronically Signed   By: Sandi Mariscal M.D.   On: 10/13/2018 14:45   US Thoracentesis Asp Pleural Space W/img Guide  Result Date: 10/09/2018 INDICATION: History of end-stage renal disease, now with symptomatic pleural effusions. Please perform chest ultrasound and ultrasound-guided thoracentesis of the side with greater volume of fluid for therapeutic and diagnostic purposes. EXAM: US THORACENTESIS ASP PLEURAL SPACE W/IMG GUIDE COMPARISON:  Chest radiograph-10/08/2018; CT abdomen pelvis-09/21/2018 MEDICATIONS: None. COMPLICATIONS: None immediate. TECHNIQUE:  Informed written consent was obtained from the patient after a discussion of the risks, benefits and alternatives to treatment. A timeout was performed prior to the initiation of the procedure. Initial ultrasound scanning demonstrates small bilateral pleural effusions, right greater than left. As such the decision was made to proceed with ultrasound-guided right-sided thoracentesis. With the patient positioned left lateral decubitus, the posterolateral aspect of the right chest was prepped and draped in the usual sterile fashion. 1% lidocaine was used for local anesthesia. An ultrasound image was saved for documentation purposes. An 8 Fr Safe-T-Centesis catheter was introduced. The thoracentesis was performed. The catheter was removed and a dressing was applied. The patient tolerated the procedure well without immediate post procedural complication. The patient was escorted to have an upright chest radiograph. FINDINGS: A total of approximately 700 cc liters of serous fluid was removed. Requested samples were sent to the laboratory. IMPRESSION: Successful ultrasound-guided right sided thoracentesis yielding 700 cc of pleural fluid. Electronically Signed   By: Sandi Mariscal M.D.   On: 10/09/2018 17:01    ONCOLOGY HISTORY: Patient underwent debulking surgery at [facility] on November 19, 2016. She received one infusion preoperative infusion of Keytruda  on November 11, 2016 as part of a clinical trial.  She initiated adjuvant carboplatinum and Taxol on December 16, 2016.  This was continued through April 28, 2017 at which time Taxol was discontinued secondary to worsening neuropathy.  Patient then received single agent carboplatinum for additional 2 infusions.  She was then noted to have progression of disease and was switched to Avastin and doxorubicin on August 18, 2017 this was discontinued on November 24, 2017 secondary to declining performance status.  Palliative gemcitabine was initiated on February 20, 2018  which was then discontinued Jul 27, 2018 secondary to declining performance status and end-stage renal disease.  Gemcitabine was reinitiated on September 24, 2018.   ASSESSMENT: Stage IIIc high-grade serous ovarian carcinoma  PLAN:  1. Stage IIIc high-grade serous ovarian carcinoma: See oncology history as above. Her most recent CA-125 is decreased slightly to 730.0, today's result is pending.  She expressed understanding that her options are limited particularly now that she has dialysis.  Plan to proceed with gemcitabine on days 1, 8, and 15 with a 22 off as previous.  Could also attempt Taxol or Taxotere, but there is concern that this would make her peripheral neuropathy worse.  BRCA and germline testing were negative on initial pathology.  Will delay her next infusion of gemcitabine.  Patient will return to clinic on October 27, 2018 for further evaluation and consideration of treatment.   2.  Shortness of breath: Likely multifactorial.  Continue dialysis 3 days a week as planned on Tuesday, Thursday, and Saturday.  Patient also has significant pulmonary hypertension that is likely contributing.  Continue oxygen 24 hours/day as prescribed. 3.  Acute renal failure: Continue dialysis as above. 4.  Pain: Patient does not complain of this today.  Continue oxycodone as needed. 5.  Anemia: Patient's hemoglobin is decreased, but stable at 9.6.  Okay to give Procrit with dialysis.  Continue to monitor closely and will transfuse if it falls below 7.0.  This will likely have to be done in conjunction with dialysis given her current fluid overload. 6.  Thrombocytopenia: Resolved.   Lloyd Huger, MD   10/20/2018 6:47 AM

## 2018-10-19 ENCOUNTER — Inpatient Hospital Stay: Payer: PPO

## 2018-10-19 ENCOUNTER — Inpatient Hospital Stay: Payer: PPO | Attending: Oncology

## 2018-10-19 ENCOUNTER — Other Ambulatory Visit: Payer: Self-pay

## 2018-10-19 ENCOUNTER — Encounter: Payer: Self-pay | Admitting: Oncology

## 2018-10-19 ENCOUNTER — Inpatient Hospital Stay (HOSPITAL_BASED_OUTPATIENT_CLINIC_OR_DEPARTMENT_OTHER): Payer: PPO | Admitting: Oncology

## 2018-10-19 VITALS — BP 156/84 | HR 81 | Temp 97.8°F | Wt 159.0 lb

## 2018-10-19 DIAGNOSIS — G893 Neoplasm related pain (acute) (chronic): Secondary | ICD-10-CM | POA: Insufficient documentation

## 2018-10-19 DIAGNOSIS — C569 Malignant neoplasm of unspecified ovary: Secondary | ICD-10-CM | POA: Insufficient documentation

## 2018-10-19 DIAGNOSIS — Z803 Family history of malignant neoplasm of breast: Secondary | ICD-10-CM | POA: Insufficient documentation

## 2018-10-19 DIAGNOSIS — N186 End stage renal disease: Secondary | ICD-10-CM | POA: Insufficient documentation

## 2018-10-19 DIAGNOSIS — R5383 Other fatigue: Secondary | ICD-10-CM | POA: Insufficient documentation

## 2018-10-19 DIAGNOSIS — Z7951 Long term (current) use of inhaled steroids: Secondary | ICD-10-CM | POA: Insufficient documentation

## 2018-10-19 DIAGNOSIS — Z87891 Personal history of nicotine dependence: Secondary | ICD-10-CM | POA: Insufficient documentation

## 2018-10-19 DIAGNOSIS — G629 Polyneuropathy, unspecified: Secondary | ICD-10-CM | POA: Diagnosis not present

## 2018-10-19 DIAGNOSIS — I272 Pulmonary hypertension, unspecified: Secondary | ICD-10-CM | POA: Diagnosis not present

## 2018-10-19 DIAGNOSIS — R140 Abdominal distension (gaseous): Secondary | ICD-10-CM | POA: Insufficient documentation

## 2018-10-19 DIAGNOSIS — R531 Weakness: Secondary | ICD-10-CM | POA: Diagnosis not present

## 2018-10-19 DIAGNOSIS — D649 Anemia, unspecified: Secondary | ICD-10-CM | POA: Diagnosis not present

## 2018-10-19 DIAGNOSIS — Z79899 Other long term (current) drug therapy: Secondary | ICD-10-CM | POA: Diagnosis not present

## 2018-10-19 DIAGNOSIS — J45909 Unspecified asthma, uncomplicated: Secondary | ICD-10-CM | POA: Diagnosis not present

## 2018-10-19 DIAGNOSIS — Z7901 Long term (current) use of anticoagulants: Secondary | ICD-10-CM | POA: Diagnosis not present

## 2018-10-19 DIAGNOSIS — R0602 Shortness of breath: Secondary | ICD-10-CM | POA: Insufficient documentation

## 2018-10-19 DIAGNOSIS — K219 Gastro-esophageal reflux disease without esophagitis: Secondary | ICD-10-CM | POA: Insufficient documentation

## 2018-10-19 DIAGNOSIS — Z66 Do not resuscitate: Secondary | ICD-10-CM | POA: Insufficient documentation

## 2018-10-19 DIAGNOSIS — I132 Hypertensive heart and chronic kidney disease with heart failure and with stage 5 chronic kidney disease, or end stage renal disease: Secondary | ICD-10-CM | POA: Diagnosis not present

## 2018-10-19 DIAGNOSIS — E785 Hyperlipidemia, unspecified: Secondary | ICD-10-CM | POA: Insufficient documentation

## 2018-10-19 DIAGNOSIS — Z992 Dependence on renal dialysis: Secondary | ICD-10-CM | POA: Diagnosis not present

## 2018-10-19 DIAGNOSIS — Z515 Encounter for palliative care: Secondary | ICD-10-CM | POA: Insufficient documentation

## 2018-10-19 DIAGNOSIS — R110 Nausea: Secondary | ICD-10-CM | POA: Insufficient documentation

## 2018-10-19 DIAGNOSIS — Z85828 Personal history of other malignant neoplasm of skin: Secondary | ICD-10-CM | POA: Diagnosis not present

## 2018-10-19 DIAGNOSIS — N179 Acute kidney failure, unspecified: Secondary | ICD-10-CM | POA: Insufficient documentation

## 2018-10-19 LAB — COMPREHENSIVE METABOLIC PANEL
ALT: 15 U/L (ref 0–44)
AST: 20 U/L (ref 15–41)
Albumin: 2.8 g/dL — ABNORMAL LOW (ref 3.5–5.0)
Alkaline Phosphatase: 98 U/L (ref 38–126)
Anion gap: 11 (ref 5–15)
BUN: 35 mg/dL — ABNORMAL HIGH (ref 8–23)
CO2: 29 mmol/L (ref 22–32)
Calcium: 9.2 mg/dL (ref 8.9–10.3)
Chloride: 94 mmol/L — ABNORMAL LOW (ref 98–111)
Creatinine, Ser: 3.79 mg/dL — ABNORMAL HIGH (ref 0.44–1.00)
GFR calc Af Amer: 14 mL/min — ABNORMAL LOW (ref >60)
GFR calc non Af Amer: 12 mL/min — ABNORMAL LOW (ref >60)
Glucose, Bld: 140 mg/dL — ABNORMAL HIGH (ref 70–99)
Potassium: 3.8 mmol/L (ref 3.5–5.1)
Sodium: 134 mmol/L — ABNORMAL LOW (ref 135–145)
Total Bilirubin: 0.8 mg/dL (ref 0.3–1.2)
Total Protein: 6 g/dL — ABNORMAL LOW (ref 6.5–8.1)

## 2018-10-19 LAB — CBC WITH DIFFERENTIAL/PLATELET
Abs Immature Granulocytes: 0.1 10*3/uL — ABNORMAL HIGH (ref 0.00–0.07)
Basophils Absolute: 0 10*3/uL (ref 0.0–0.1)
Basophils Relative: 0 %
Eosinophils Absolute: 0.1 10*3/uL (ref 0.0–0.5)
Eosinophils Relative: 1 %
HCT: 30.8 % — ABNORMAL LOW (ref 36.0–46.0)
Hemoglobin: 9.6 g/dL — ABNORMAL LOW (ref 12.0–15.0)
Immature Granulocytes: 1 %
Lymphocytes Relative: 13 %
Lymphs Abs: 1.2 10*3/uL (ref 0.7–4.0)
MCH: 31.7 pg (ref 26.0–34.0)
MCHC: 31.2 g/dL (ref 30.0–36.0)
MCV: 101.7 fL — ABNORMAL HIGH (ref 80.0–100.0)
Monocytes Absolute: 0.9 10*3/uL (ref 0.1–1.0)
Monocytes Relative: 10 %
Neutro Abs: 6.5 10*3/uL (ref 1.7–7.7)
Neutrophils Relative %: 75 %
Platelets: 267 10*3/uL (ref 150–400)
RBC: 3.03 MIL/uL — ABNORMAL LOW (ref 3.87–5.11)
RDW: 16.2 % — ABNORMAL HIGH (ref 11.5–15.5)
WBC: 8.8 10*3/uL (ref 4.0–10.5)
nRBC: 0 % (ref 0.0–0.2)

## 2018-10-19 LAB — SAMPLE TO BLOOD BANK

## 2018-10-19 MED ORDER — HEPARIN SOD (PORK) LOCK FLUSH 100 UNIT/ML IV SOLN
500.0000 [IU] | Freq: Once | INTRAVENOUS | Status: AC
  Administered 2018-10-19: 500 [IU] via INTRAVENOUS

## 2018-10-19 MED ORDER — SODIUM CHLORIDE 0.9% FLUSH
10.0000 mL | INTRAVENOUS | Status: DC | PRN
  Administered 2018-10-19: 12:00:00 10 mL via INTRAVENOUS
  Filled 2018-10-19: qty 10

## 2018-10-19 NOTE — Progress Notes (Signed)
Patient stated that she had not been doing well. Patient would like to know if she could not get treatment today if her markers are out of range. Patient would like a refill on her xanax and flexeril.

## 2018-10-20 ENCOUNTER — Ambulatory Visit: Payer: PPO | Admitting: Family

## 2018-10-20 LAB — CA 125: Cancer Antigen (CA) 125: 666 U/mL — ABNORMAL HIGH (ref 0.0–38.1)

## 2018-10-20 MED ORDER — CYCLOBENZAPRINE HCL 10 MG PO TABS: 10.0000 mg | ORAL_TABLET | Freq: Three times a day (TID) | ORAL | 1 refills | Status: DC | PRN

## 2018-10-20 MED ORDER — ALPRAZOLAM 0.25 MG PO TABS: 0.2500 mg | ORAL_TABLET | Freq: Every evening | ORAL | 0 refills | Status: DC | PRN

## 2018-10-21 DIAGNOSIS — I509 Heart failure, unspecified: Secondary | ICD-10-CM | POA: Diagnosis not present

## 2018-10-23 ENCOUNTER — Ambulatory Visit (INDEPENDENT_AMBULATORY_CARE_PROVIDER_SITE_OTHER): Payer: PPO | Admitting: Family Medicine

## 2018-10-23 ENCOUNTER — Encounter: Payer: Self-pay | Admitting: Family Medicine

## 2018-10-23 VITALS — BP 163/88 | Wt 151.9 lb

## 2018-10-23 DIAGNOSIS — R0902 Hypoxemia: Secondary | ICD-10-CM

## 2018-10-23 DIAGNOSIS — I10 Essential (primary) hypertension: Secondary | ICD-10-CM

## 2018-10-23 DIAGNOSIS — Z992 Dependence on renal dialysis: Secondary | ICD-10-CM | POA: Diagnosis not present

## 2018-10-23 DIAGNOSIS — C569 Malignant neoplasm of unspecified ovary: Secondary | ICD-10-CM | POA: Diagnosis not present

## 2018-10-23 NOTE — Progress Notes (Signed)
Name: Jessica Burgess   MRN: [id number]    DOB: 1955/05/28   Date:10/23/2018       Progress Note  Subjective  Chief Complaint  Chief Complaint  Patient presents with   Hypertension    I connected with  [name]  on 10/23/18 at  9:40 AM EDT by a video enabled telemedicine application and verified that I am speaking with the correct person using two identifiers.  I discussed the limitations of evaluation and management by telemedicine and the availability of in person appointments. The patient expressed understanding and agreed to proceed. Staff also discussed with the patient that there may be a patient responsible charge related to this service. Patient Location: at home  Provider Location: [facility]   HPI  HTN: bp today is elevated, yesterday after HD it went down to 128 SBP, most of the time it is controlled when she checks it at home. She has continuous SOB  CKI Stage V: last GFR 12 this month, she denies pruritis, it finally resolved. HD three times a week.   Anxiety: getting worse, did not respond to Hydroxyzine and asked Dr. Grayland Ormond for alprazolam, taking low dose at night and it has helped some, also on duloxetine. Depression improved  Carcinomatosis : recently seen by Dr. Grayland Ormond, still having therapy, failed multiple treatments, currently on palliative therapy Gemcitabine resumed 09/24/2018   Patient Active Problem List   Diagnosis Date Noted   Acute on chronic heart failure with preserved ejection fraction (HFpEF) (HCC)    Paroxysmal atrial fibrillation ([place])    Sepsis ([place]) 10/08/2018   CHF (congestive heart failure) ([place]) 08/28/2018   Acute respiratory failure ([place])    Anxiety 08/14/2018   ESRD on dialysis [facility])    Acute CHF (congestive heart failure) ([place]) 08/07/2018   Acute CHF ([place]) 08/06/2018   Hypertension due to drug 10/28/2017   Mucositis due to chemotherapy 09/02/2017   Goals of care, counseling/discussion  08/08/2017   Hypothyroidism due to medication 07/28/2017   Genetic testing 12/18/2016   Migraines 12/01/2016   Postoperative seroma of subcutaneous tissue after non-dermatologic procedure 12/01/2016   Transaminitis 12/01/2016   Anemia associated with acute blood loss 11/20/2016   Ovarian cancer, unspecified laterality ([place]) 11/12/2016   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[place]) 11/05/2016   Examination of participant in clinical trial 11/01/2016   Carcinomatosis ([place]) 11/01/2016   Chronic right-sided low back pain with right-sided sciatica 02/02/2016   Status post insertion of dialysis catheter ([place]) 08/01/2015   Hematoma 06/14/2015   Perennial allergic rhinitis 05/05/2015   Generalized anxiety disorder 02/03/2015   Back pain, chronic 08/25/2014   Insomnia, persistent 08/25/2014   Chronic cervical pain 08/25/2014   Major depression, chronic ([place]) 08/25/2014   Dyslipidemia 08/25/2014   Gastro-esophageal reflux disease without esophagitis 08/25/2014   H/O high risk medication treatment 08/25/2014   Blood glucose elevated 08/25/2014   Migraine without aura and without status migrainosus, not intractable 08/25/2014   Climacteric 73/42/8768   Dysmetabolic syndrome 11/57/2620   Fungal infection of toenail 08/25/2014   Obesity (BMI 30.0-34.9) 08/25/2014   Vitamin D deficiency 08/25/2014   Engages in travel abroad 08/25/2014   Cervical radiculitis 07/23/2013   Cervical disc disorder with radiculopathy 04/28/2013    Past Surgical History:  Procedure Laterality Date   ABDOMINAL HYSTERECTOMY     ANTERIOR CERVICAL DECOMP/DISCECTOMY FUSION N/A 06/07/2015   Procedure: Cervical three - four and Cervical six- seven anterior cervical decompression with fusion interbody prosthesis  plating and bonegraft;  Surgeon: Newman Pies, MD;  Location: [facility];  Service: Neurosurgery;  Laterality: N/A;  C34 and C67 anterior cervical decompression with  fusion interbody prosthesis plating and bonegraft   BACK SURGERY     x2 Lower    DIALYSIS/PERMA CATHETER INSERTION N/A 08/17/2018   Procedure: DIALYSIS/PERMA CATHETER INSERTION;  Surgeon: Algernon Huxley, MD;  Location: [facility];  Service: Cardiovascular;  Laterality: N/A;   EVACUATION OF CERVICAL HEMATOMA N/A 06/14/2015   Procedure: EVACUATION OF CERVICAL HEMATOMA;  Surgeon: Newman Pies, MD;  Location: [facility];  Service: Neurosurgery;  Laterality: N/A;   NECK SURGERY     x3   TONSILLECTOMY      Family History  Problem Relation Age of Onset   Depression Mother    Migraines Mother    Dementia Father    Diabetes Father    Hyperlipidemia Father    Hyperlipidemia Brother    Hyperlipidemia Brother    Breast cancer Paternal Aunt        46s    Social History   Socioeconomic History   Marital status: Single    Spouse name: Not on file   Number of children: 0   Years of education: some college   Highest education level: 12th grade  Occupational History    Employer: DISABLED   Occupation: Disabled   Scientist, product/process development strain: Somewhat hard   Food insecurity    Worry: Never true    Inability: Never true   Transportation needs    Medical: No    Non-medical: No  Tobacco Use   Smoking status: Former Smoker    Packs/day: 1.50    Years: 20.00    Pack years: 30.00    Types: Cigarettes    Start date: 03/19/1979    Quit date: 08/25/1999    Years since quitting: 19.1   Smokeless tobacco: Never Used   Tobacco comment: smoking cessation materials not required  Substance and Sexual Activity   Alcohol use: Not Currently    Alcohol/week: 0.0 standard drinks   Drug use: No   Sexual activity: Never  Lifestyle   Physical activity    Days per week: 0 days    Minutes per session: 0 min   Stress: Very much  Relationships   Social connections    Talks on phone: Patient refused    Gets together: Patient refused    Attends  religious service: Patient refused    Active member of club or organization: Patient refused    Attends meetings of clubs or organizations: Patient refused    Relationship status: Patient refused   Intimate partner violence    Fear of current or ex partner: No    Emotionally abused: No    Physically abused: No    Forced sexual activity: No  Other Topics Concern   Not on file  Social History Narrative   Patient is single.    Patient lives with roommates.    Patient on disability    Patient has no children.    Patient has some college      Current Outpatient Medications:    albuterol (PROVENTIL HFA;VENTOLIN HFA) 108 (90 Base) MCG/ACT inhaler, Inhale 2 puffs into the lungs every 6 (six) hours as needed for wheezing or shortness of breath., Disp: 1 Inhaler, Rfl: 2   albuterol (PROVENTIL) (2.5 MG/3ML) 0.083% nebulizer solution, Take 3 mLs (2.5 mg total) by nebulization every 6 (six) hours as needed for  wheezing or shortness of breath., Disp: 75 mL, Rfl: 2   ALPRAZolam (XANAX) 0.25 MG tablet, Take 1 tablet (0.25 mg total) by mouth at bedtime as needed for anxiety., Disp: 30 tablet, Rfl: 0   amiodarone (PACERONE) 200 MG tablet, Take 1 tablet (200 mg total) by mouth 2 (two) times daily., Disp: 60 tablet, Rfl: 0   apixaban (ELIQUIS) 5 MG TABS tablet, Take 1 tablet (5 mg total) by mouth 2 (two) times daily., Disp: 60 tablet, Rfl: 0   cetirizine (ZYRTEC) 10 MG tablet, Take 10 mg by mouth daily., Disp: , Rfl:    Cholecalciferol (VITAMIN D-1000 MAX ST) 1000 UNITS tablet, Take 1,000 Units by mouth 2 (two) times daily. , Disp: , Rfl:    cyclobenzaprine (FLEXERIL) 10 MG tablet, Take 1 tablet (10 mg total) by mouth every 8 (eight) hours as needed for muscle spasms., Disp: 30 tablet, Rfl: 1   DULoxetine (CYMBALTA) 60 MG capsule, Take 1 capsule (60 mg total) by mouth daily., Disp: 90 capsule, Rfl: 0   ferrous sulfate (IRON SUPPLEMENT) 325 (65 FE) MG tablet, Take 1 tablet by mouth daily., Disp:  , Rfl:    hydrALAZINE (APRESOLINE) 25 MG tablet, Take 1 tablet (25 mg total) by mouth 3 (three) times daily. For BP if above 150/90, Disp: 90 tablet, Rfl: 0   hydrOXYzine (ATARAX/VISTARIL) 10 MG tablet, Take 1 tablet (10 mg total) by mouth every 6 (six) hours as needed. (Patient taking differently: Take 10 mg by mouth every 6 (six) hours as needed for itching or anxiety. ), Disp: 120 tablet, Rfl: 0   ipratropium (ATROVENT HFA) 17 MCG/ACT inhaler, Inhale 2 puffs into the lungs every 6 (six) hours as needed for wheezing. , Disp: , Rfl:    isosorbide mononitrate (IMDUR) 30 MG 24 hr tablet, Take 1 tablet (30 mg total) by mouth daily., Disp: 30 tablet, Rfl: 2   levothyroxine (SYNTHROID) 25 MCG tablet, Take 1 tablet (25 mcg total) by mouth daily before breakfast., Disp: 30 tablet, Rfl: 2   lidocaine (XYLOCAINE) 2 % solution, Use as directed 15 mLs in the mouth or throat as needed for mouth pain., Disp: 100 mL, Rfl: 0   losartan (COZAAR) 100 MG tablet, Take 1 tablet (100 mg total) by mouth daily., Disp: 30 tablet, Rfl: 0   Lysine 1000 MG TABS, Take 1,000 mg by mouth daily. , Disp: , Rfl:    Magnesium Oxide 400 (240 Mg) MG TABS, Take 400 mg by mouth 2 (two) times daily. , Disp: , Rfl:    metoCLOPramide (REGLAN) 5 MG tablet, Take 1 tablet (5 mg total) by mouth every 8 (eight) hours as needed for nausea or vomiting., Disp: 90 tablet, Rfl: 1   metoprolol succinate (TOPROL-XL) 100 MG 24 hr tablet, Take 1 tablet (100 mg total) by mouth daily. Take with or immediately following a meal., Disp: 30 tablet, Rfl: 0   mometasone-formoterol (DULERA) 200-5 MCG/ACT AERO, Inhale 2 puffs into the lungs 2 (two) times daily., Disp: , Rfl:    Multiple Vitamins-Minerals (MULTIVITAMIN PO), Take 1 tablet by mouth daily. , Disp: , Rfl:    Omega-3 Fatty Acids (FISH OIL) 1000 MG CAPS, Take 1,000 capsules by mouth 2 (two) times daily. , Disp: , Rfl:    ondansetron (ZOFRAN-ODT) 8 MG disintegrating tablet, Take 1 tablet (8  mg total) by mouth every 8 (eight) hours as needed for nausea or vomiting., Disp: 60 tablet, Rfl: 3   oxyCODONE-acetaminophen (PERCOCET) 10-325 MG tablet, Take 1 tablet by  mouth every 6 (six) hours as needed for pain., Disp: 60 tablet, Rfl: 0   polyethylene glycol powder (GLYCOLAX/MIRALAX) 17 GM/SCOOP powder, Take 17 g by mouth daily as needed for mild constipation or moderate constipation. , Disp: , Rfl:    SUMAtriptan (IMITREX) 100 MG tablet, May repeat in 2 hours if headache persists or recurs. (Patient taking differently: Take 100 mg by mouth daily as needed for migraine. May repeat in 2 hours if headache persists or recurs.), Disp: 9 tablet, Rfl: 1   temazepam (RESTORIL) 30 MG capsule, Take 1 capsule (30 mg total) by mouth at bedtime as needed for sleep., Disp: 30 capsule, Rfl: 0   torsemide (DEMADEX) 20 MG tablet, Take 2 tablets (40 mg total) by mouth 2 (two) times daily., Disp: 120 tablet, Rfl: 0   vitamin B-12 (CYANOCOBALAMIN) 1000 MCG tablet, Take 1,000 mcg by mouth daily., Disp: , Rfl:    vitamin C (ASCORBIC ACID) 500 MG tablet, Take 500 mg by mouth daily., Disp: , Rfl:    fluticasone (FLONASE) 50 MCG/ACT nasal spray, Place 2 sprays into both nostrils as needed., Disp: 48 g, Rfl: 1 No current facility-administered medications for this visit.   Facility-Administered Medications Ordered in Other Visits:    0.9 %  sodium chloride infusion, , Intravenous, Once, Finnegan, Kathlene November, MD   0.9 %  sodium chloride infusion, , Intravenous, Once, Grayland Ormond, Kathlene November, MD   sodium chloride flush (NS) 0.9 % injection 10 mL, 10 mL, Intravenous, PRN, Lloyd Huger, MD, 10 mL at 06/16/17 0854  No Known Allergies  I personally reviewed active problem list, medication list, allergies, family history, social history with the patient/caregiver today.   ROS  Ten systems reviewed and is negative except as mentioned in HPI she also has chronic pain, SOB , fatigue  Objective  Virtual  encounter  Today's Vitals   10/23/18 0857  BP: (!) 163/88  Weight: 151 lb 14.4 oz (68.9 kg)  PainSc: 0-No pain   Body mass index is 27.78 kg/m. Physical Exam  Awake, alert and oriented  Some sob , on oxygen 2 liters sometimes bumps to 2.5 liters   PHQ2/9: Depression screen [facility] 2/9 10/23/2018 09/28/2018 09/16/2018 09/16/2018 09/08/2018  Decreased Interest 0 0 0 0 1  Down, Depressed, Hopeless 0 - 1 0 1  PHQ - 2 Score 0 0 1 0 2  Altered sleeping 0 0 3 0 3  Tired, decreased energy 0 1 3 0 3  Change in appetite 0 1 0 0 2  Feeling bad or failure about yourself  0 0 1 0 0  Trouble concentrating 0 0 1 0 1  Moving slowly or fidgety/restless 0 0 0 0 0  Suicidal thoughts 0 0 0 0 0  PHQ-9 Score 0 2 9 0 11  Difficult doing work/chores - Not difficult at all Not difficult at all - Somewhat difficult  Some recent data might be hidden   PHQ-2/9 Result is negative.    Fall Risk: Fall Risk  09/28/2018 09/16/2018 09/08/2018 08/21/2018 03/12/2018  Falls in the past year? 0 0 0 0 0  Comment - - - - -  Number falls in past yr: 0 0 0 0 0  Comment - - - - -  Injury with Fall? 0 0 0 0 0  Risk for fall due to : - - - - -  Risk for fall due to: Comment - - - - -  Follow up - - Falls prevention discussed - -  Assessment & Plan  1. Hemodialysis patient ([place])  Compliant   2. Hypoxemia  stable  3. Uncontrolled hypertension  Better with hydralazine   4. 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[place])  Still under the care of oncologist  I discussed the assessment and treatment plan with the patient. The patient was provided an opportunity to ask questions and all were answered. The patient agreed with the plan and demonstrated an understanding of the instructions.  The patient was advised to call back or seek an in-person evaluation if the symptoms worsen or if the condition fails to improve as anticipated.  I provided 25 minutes of non-face-to-face time during this encounter.

## 2018-10-24 NOTE — Progress Notes (Deleted)
[place]  [phone] [phone]  ID: Jessica Burgess OB: 1956/01/20  MR#: [id number]  SFK#:[id number]  Patient Care Team: Patient, No Pcp Per as PCP - General (General Practice) End, Harrell Gave, MD as PCP - Cardiology (Cardiology) Lloyd Huger, MD as Consulting Physician (Oncology) Mellody Drown, MD as Consulting Physician (Obstetrics and Gynecology) Cathi Roan, [facility] 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  INTERVAL HISTORY: Patient returns to clinic today for further evaluation, hospital follow-up, and consideration of her next infusion of gemcitabine.  She continues to have significant weakness and fatigue and decreased performance status, but this is mildly improved since discharge.  She feels like her shortness of breath is worse than her baseline.  She denies any fevers. She has no neurologic complaints.  She denies any chest pain, cough, or hemoptysis.  She has occasional nausea, but denies any vomiting, constipation, or diarrhea.  She denies any abdominal pain or bloating.  She has no urinary complaints.  Patient feels generally terrible, but offers no further specific complaints today.  REVIEW OF SYSTEMS:   Review of Systems  Constitutional: Positive for malaise/fatigue. Negative for fever and weight loss.  HENT: Negative.  Negative for sore throat.   Eyes: Negative.  Negative for blurred vision.  Respiratory: Positive for shortness of breath. Negative for cough.   Cardiovascular: Negative.  Negative for chest pain and leg swelling.  Gastrointestinal: Positive for nausea. Negative for abdominal pain, blood in stool, constipation, diarrhea, melena and vomiting.  Genitourinary: Negative.  Negative for dysuria.  Musculoskeletal:  Negative.  Negative for back pain, joint pain and neck pain.  Skin: Negative.  Negative for itching and rash.  Neurological: Positive for tingling, sensory change and weakness. Negative for focal weakness.  Psychiatric/Behavioral: Negative.  The patient is not nervous/anxious.      As per HPI. Otherwise, a complete review of systems is negative.  PAST MEDICAL HISTORY: Past Medical History:  Diagnosis Date   Allergic rhinitis, cause unspecified    Anxiety state, unspecified    Arthritis    Asthma    only when sick    Backache, unspecified    Bronchitis    hx of when get sick   Cancer [facility])    skin cancer , basal cell    Cancer ([place]) 11/2016   ovarian   Cervicalgia    CHF (congestive heart failure) (HCC)    Complication of anesthesia    Dermatophytosis of nail    Dysmetabolic syndrome X    Encounter for long-term (current) use of other medications    Esophageal reflux    Hypertension    Insomnia, unspecified    Leukocytosis, unspecified    Migraine without aura, without mention of intractable migraine without mention of status migrainosus    Other and unspecified hyperlipidemia    Other malaise and fatigue    Overweight(278.02)    Personal history of chemotherapy now   ovarian   PONV (postoperative nausea and vomiting)    Renal insufficiency    Spinal stenosis in cervical region    Symptomatic menopausal or female climacteric states    Unspecified disorder of skin and subcutaneous tissue    Unspecified vitamin D deficiency     PAST SURGICAL HISTORY: Past Surgical History:  Procedure Laterality Date   ABDOMINAL HYSTERECTOMY     ANTERIOR CERVICAL  DECOMP/DISCECTOMY FUSION N/A 06/07/2015   Procedure: Cervical three - four and Cervical six- seven anterior cervical decompression with fusion interbody prosthesis plating and bonegraft;  Surgeon: Newman Pies, MD;  Location: [place] [facility];  Service: Neurosurgery;  Laterality: N/A;  C34 and C67  anterior cervical decompression with fusion interbody prosthesis plating and bonegraft   BACK SURGERY     x2 Lower    DIALYSIS/PERMA CATHETER INSERTION N/A 08/17/2018   Procedure: DIALYSIS/PERMA CATHETER INSERTION;  Surgeon: Algernon Huxley, MD;  Location: [place] CV LAB;  Service: Cardiovascular;  Laterality: N/A;   EVACUATION OF CERVICAL HEMATOMA N/A 06/14/2015   Procedure: EVACUATION OF CERVICAL HEMATOMA;  Surgeon: Newman Pies, MD;  Location: [place] [facility];  Service: Neurosurgery;  Laterality: N/A;   NECK SURGERY     x3   TONSILLECTOMY      FAMILY HISTORY: Family History  Problem Relation Age of Onset   Depression Mother    Migraines Mother    Dementia Father    Diabetes Father    Hyperlipidemia Father    Hyperlipidemia Brother    Hyperlipidemia Brother    Breast cancer Paternal Aunt        83s    ADVANCED DIRECTIVES (Y/N):  N  HEALTH MAINTENANCE: Social History   Tobacco Use   Smoking status: Former Smoker    Packs/day: 1.50    Years: 20.00    Pack years: 30.00    Types: Cigarettes    Start date: 03/19/1979    Quit date: 08/25/1999    Years since quitting: 19.1   Smokeless tobacco: Never Used   Tobacco comment: smoking cessation materials not required  Substance Use Topics   Alcohol use: Not Currently    Alcohol/week: 0.0 standard drinks   Drug use: No     Colonoscopy:  PAP:  Bone density:  Lipid panel:  No Known Allergies  Current Outpatient Medications  Medication Sig Dispense Refill   albuterol (PROVENTIL HFA;VENTOLIN HFA) 108 (90 Base) MCG/ACT inhaler Inhale 2 puffs into the lungs every 6 (six) hours as needed for wheezing or shortness of breath. 1 Inhaler 2   albuterol (PROVENTIL) (2.5 MG/3ML) 0.083% nebulizer solution Take 3 mLs (2.5 mg total) by nebulization every 6 (six) hours as needed for wheezing or shortness of breath. 75 mL 2   ALPRAZolam (XANAX) 0.25 MG tablet Take 1 tablet (0.25 mg total) by mouth at bedtime as needed  for anxiety. 30 tablet 0   amiodarone (PACERONE) 200 MG tablet Take 1 tablet (200 mg total) by mouth 2 (two) times daily. 60 tablet 0   apixaban (ELIQUIS) 5 MG TABS tablet Take 1 tablet (5 mg total) by mouth 2 (two) times daily. 60 tablet 0   cetirizine (ZYRTEC) 10 MG tablet Take 10 mg by mouth daily.     Cholecalciferol (VITAMIN D-1000 MAX ST) 1000 UNITS tablet Take 1,000 Units by mouth 2 (two) times daily.      cyclobenzaprine (FLEXERIL) 10 MG tablet Take 1 tablet (10 mg total) by mouth every 8 (eight) hours as needed for muscle spasms. 30 tablet 1   DULoxetine (CYMBALTA) 60 MG capsule Take 1 capsule (60 mg total) by mouth daily. 90 capsule 0   ferrous sulfate (IRON SUPPLEMENT) 325 (65 FE) MG tablet Take 1 tablet by mouth daily.     fluticasone (FLONASE) 50 MCG/ACT nasal spray Place 2 sprays into both nostrils as needed. 48 g 1   hydrALAZINE (APRESOLINE) 25 MG tablet Take 1 tablet (25 mg total) by  mouth 3 (three) times daily. For BP if above 150/90 90 tablet 0   ipratropium (ATROVENT HFA) 17 MCG/ACT inhaler Inhale 2 puffs into the lungs every 6 (six) hours as needed for wheezing.      isosorbide mononitrate (IMDUR) 30 MG 24 hr tablet Take 1 tablet (30 mg total) by mouth daily. 30 tablet 2   levothyroxine (SYNTHROID) 25 MCG tablet Take 1 tablet (25 mcg total) by mouth daily before breakfast. 30 tablet 2   lidocaine (XYLOCAINE) 2 % solution Use as directed 15 mLs in the mouth or throat as needed for mouth pain. 100 mL 0   losartan (COZAAR) 100 MG tablet Take 1 tablet (100 mg total) by mouth daily. 30 tablet 0   Lysine 1000 MG TABS Take 1,000 mg by mouth daily.      Magnesium Oxide 400 (240 Mg) MG TABS Take 400 mg by mouth 2 (two) times daily.      metoCLOPramide (REGLAN) 5 MG tablet Take 1 tablet (5 mg total) by mouth every 8 (eight) hours as needed for nausea or vomiting. 90 tablet 1   metoprolol succinate (TOPROL-XL) 100 MG 24 hr tablet Take 1 tablet (100 mg total) by mouth  daily. Take with or immediately following a meal. 30 tablet 0   mometasone-formoterol (DULERA) 200-5 MCG/ACT AERO Inhale 2 puffs into the lungs 2 (two) times daily.     Multiple Vitamins-Minerals (MULTIVITAMIN PO) Take 1 tablet by mouth daily.      Omega-3 Fatty Acids (FISH OIL) 1000 MG CAPS Take 1,000 capsules by mouth 2 (two) times daily.      ondansetron (ZOFRAN-ODT) 8 MG disintegrating tablet Take 1 tablet (8 mg total) by mouth every 8 (eight) hours as needed for nausea or vomiting. 60 tablet 3   oxyCODONE-acetaminophen (PERCOCET) 10-325 MG tablet Take 1 tablet by mouth every 6 (six) hours as needed for pain. 60 tablet 0   polyethylene glycol powder (GLYCOLAX/MIRALAX) 17 GM/SCOOP powder Take 17 g by mouth daily as needed for mild constipation or moderate constipation.      SUMAtriptan (IMITREX) 100 MG tablet May repeat in 2 hours if headache persists or recurs. (Patient taking differently: Take 100 mg by mouth daily as needed for migraine. May repeat in 2 hours if headache persists or recurs.) 9 tablet 1   temazepam (RESTORIL) 30 MG capsule Take 1 capsule (30 mg total) by mouth at bedtime as needed for sleep. 30 capsule 0   torsemide (DEMADEX) 20 MG tablet Take 2 tablets (40 mg total) by mouth 2 (two) times daily. 120 tablet 0   vitamin B-12 (CYANOCOBALAMIN) 1000 MCG tablet Take 1,000 mcg by mouth daily.     vitamin C (ASCORBIC ACID) 500 MG tablet Take 500 mg by mouth daily.     No current facility-administered medications for this visit.    Facility-Administered Medications Ordered in Other Visits  Medication Dose Route Frequency Provider Last Rate Last Dose   0.9 %  sodium chloride infusion   Intravenous Once Grayland Ormond, Kathlene November, MD       0.9 %  sodium chloride infusion   Intravenous Once Lloyd Huger, MD       sodium chloride flush (NS) 0.9 % injection 10 mL  10 mL Intravenous PRN Lloyd Huger, MD   10 mL at 06/16/17 0854    OBJECTIVE: There were no vitals  filed for this visit.   There is no height or weight on file to calculate BMI.    ECOG  FS:3 - Symptomatic, >50% confined to bed  General: Ill-appearing, no acute distress.  Sitting in a wheelchair. Eyes: Pink conjunctiva, anicteric sclera. HEENT: Normocephalic, moist mucous membranes. Lungs: Clear to auscultation bilaterally. Heart: Regular rate and rhythm. No rubs, murmurs, or gallops. Abdomen: Soft, nontender, nondistended. No organomegaly noted, normoactive bowel sounds. Musculoskeletal: No edema, cyanosis, or clubbing. Neuro: Alert, answering all questions appropriately. Cranial nerves grossly intact. Skin: No rashes or petechiae noted. Psych: Normal affect.  LAB RESULTS:  Lab Results  Component Value Date   NA 134 (L) 10/19/2018   K 3.8 10/19/2018   CL 94 (L) 10/19/2018   CO2 29 10/19/2018   GLUCOSE 140 (H) 10/19/2018   BUN 35 (H) 10/19/2018   CREATININE 3.79 (H) 10/19/2018   CALCIUM 9.2 10/19/2018   PROT 6.0 (L) 10/19/2018   ALBUMIN 2.8 (L) 10/19/2018   AST 20 10/19/2018   ALT 15 10/19/2018   ALKPHOS 98 10/19/2018   BILITOT 0.8 10/19/2018   GFRNONAA 12 (L) 10/19/2018   GFRAA 14 (L) 10/19/2018    Lab Results  Component Value Date   WBC 8.8 10/19/2018   NEUTROABS 6.5 10/19/2018   HGB 9.6 (L) 10/19/2018   HCT 30.8 (L) 10/19/2018   MCV 101.7 (H) 10/19/2018   PLT 267 10/19/2018     STUDIES: Dg Chest 2 View  Result Date: 10/10/2018 CLINICAL DATA:  Ovarian cancer on chemotherapy, hypertension, CHF, asthma, end-stage renal disease on dialysis, former smoker EXAM: CHEST - 2 VIEW COMPARISON:  10/09/2018 FINDINGS: RIGHT jugular Port-A-Cath with tip projecting over SVC. LEFT jugular dual-lumen central venous catheter with tip above cavoatrial junction. Enlargement of cardiac silhouette with pulmonary vascular congestion. Stable mediastinal contours. Bibasilar effusions and mild LEFT basilar atelectasis. Minimal perihilar edema. No pneumothorax or acute osseous findings.  IMPRESSION: Enlargement of cardiac silhouette with pulmonary vascular congestion and mild pulmonary edema. Bibasilar effusions and LEFT LEFT basilar atelectasis. Electronically Signed   By: Lavonia Dana M.D.   On: 10/10/2018 17:28   Ct Chest Wo Contrast  Result Date: 10/12/2018 CLINICAL DATA:  Pleural effusion. Clinical sepsis and neutropenic fever. Status post thoracentesis. EXAM: CT CHEST WITHOUT CONTRAST TECHNIQUE: Multidetector CT imaging of the chest was performed following the standard protocol without IV contrast. COMPARISON:  Chest CT 08/09/2018 FINDINGS: Cardiovascular: The heart is enlarged but stable. No pericardial effusion. Stable tortuosity, ectasia and calcification of the thoracic aorta. Scattered coronary artery calcifications. Right-sided Port-A-Cath and left IJ dialysis catheter in good positions without complicating features. Mediastinum/Nodes: Stable scattered mediastinal and hilar lymph nodes without mass or overt adenopathy. The esophagus is grossly normal. Lungs/Pleura: Moderate-sized bilateral pleural effusions with overlying atelectasis. Patchy inflammatory changes in both lungs with areas of tree-in-bud appearance suggesting chronic inflammation or atypical infection such as MAC or viral pneumonitis. No worrisome pulmonary lesions. No endobronchial lesions are identified. Upper Abdomen: No significant upper abdominal findings. Musculoskeletal: No significant bony findings. No breast masses, supraclavicular or axillary adenopathy. IMPRESSION: 1. Moderate-sized bilateral pleural effusions with overlying atelectasis. 2. Patchy inflammatory or atypical infectious process in the lungs. No focal pneumonia. No worrisome pulmonary lesions. 3. Stable cardiac enlargement and tortuosity and calcification of the thoracic aorta. 4. Stable mediastinal and hilar lymph nodes without mass or overt adenopathy. Aortic Atherosclerosis (ICD10-I70.0). Electronically Signed   By: Marijo Sanes M.D.   On:  10/12/2018 12:26   US Abdomen Limited  Result Date: 10/09/2018 CLINICAL DATA:  History of end-stage renal disease, now with bilateral pleural effusions and intra-abdominal ascites. Please perform ascites search ultrasound  and ultrasound-guided paracentesis as indicated. EXAM: LIMITED ABDOMEN ULTRASOUND FOR ASCITES TECHNIQUE: Limited ultrasound survey for ascites was performed in all four abdominal quadrants. COMPARISON:  CT abdomen pelvis-09/21/2018 FINDINGS: Sonographic evaluation demonstrates a trace amount of intra-abdominal ascites, too small to allow for safe ultrasound-guided paracentesis. Note is made of small bilateral pleural effusions, right greater than left. IMPRESSION: 1. Trace amount of intra-abdominal ascites, too small to allow for safe ultrasound-guided paracentesis. 2. Note is made of small bilateral pleural effusions, right greater than left. Patient subsequently underwent ultrasound-guided right-sided thoracentesis. Electronically Signed   By: Sandi Mariscal M.D.   On: 10/09/2018 16:59   US Venous Img Lower Bilateral  Result Date: 10/09/2018 CLINICAL DATA:  Bilateral lower extremity pain and edema. EXAM: BILATERAL LOWER EXTREMITY VENOUS DOPPLER ULTRASOUND TECHNIQUE: Gray-scale sonography with graded compression, as well as color Doppler and duplex ultrasound were performed to evaluate the lower extremity deep venous systems from the level of the common femoral vein and including the common femoral, femoral, profunda femoral, popliteal and calf veins including the posterior tibial, peroneal and gastrocnemius veins when visible. The superficial great saphenous vein was also interrogated. Spectral Doppler was utilized to evaluate flow at rest and with distal augmentation maneuvers in the common femoral, femoral and popliteal veins. COMPARISON:  None. FINDINGS: RIGHT LOWER EXTREMITY Common Femoral Vein: No evidence of thrombus. Normal compressibility, respiratory phasicity and response to  augmentation. Saphenofemoral Junction: No evidence of thrombus. Normal compressibility and flow on color Doppler imaging. Profunda Femoral Vein: No evidence of thrombus. Normal compressibility and flow on color Doppler imaging. Femoral Vein: No evidence of thrombus. Normal compressibility, respiratory phasicity and response to augmentation. Popliteal Vein: No evidence of thrombus. Normal compressibility, respiratory phasicity and response to augmentation. Calf Veins: No evidence of thrombus. Normal compressibility and flow on color Doppler imaging. Superficial Great Saphenous Vein: No evidence of thrombus. Normal compressibility. Venous Reflux:  None. Other Findings: No evidence of superficial thrombophlebitis or abnormal fluid collection. LEFT LOWER EXTREMITY Common Femoral Vein: No evidence of thrombus. Normal compressibility, respiratory phasicity and response to augmentation. Saphenofemoral Junction: No evidence of thrombus. Normal compressibility and flow on color Doppler imaging. Profunda Femoral Vein: No evidence of thrombus. Normal compressibility and flow on color Doppler imaging. Femoral Vein: No evidence of thrombus. Normal compressibility, respiratory phasicity and response to augmentation. Popliteal Vein: No evidence of thrombus. Normal compressibility, respiratory phasicity and response to augmentation. Calf Veins: No evidence of thrombus. Normal compressibility and flow on color Doppler imaging. Superficial Great Saphenous Vein: No evidence of thrombus. Normal compressibility. Venous Reflux:  None. Other Findings: No evidence of superficial thrombophlebitis or abnormal fluid collection. IMPRESSION: No evidence of deep venous thrombosis in either lower extremity. Electronically Signed   By: Aletta Edouard M.D.   On: 10/09/2018 17:41   Dg Chest Port 1 View  Result Date: 10/13/2018 CLINICAL DATA:  Symptomatic bilateral pleural effusions. Please perform left-sided ultrasound-guided thoracentesis for  diagnostic and therapeutic purposes. EXAM: PORTABLE CHEST 1 VIEW COMPARISON:  Chest CT - 10/12/2018; chest radiograph - 10/10/2018; ultrasound-guided right-sided thoracentesis - 10/09/2018 FINDINGS: Grossly unchanged enlarged cardiac silhouette and mediastinal contours. Stable position of support apparatus. Interval reduction/resolution of left-sided pleural effusion post thoracentesis. No pneumothorax. Improved aeration of the left lung base. Unchanged small right-sided effusion associated right basilar opacities. No new focal airspace opacities. Pulmonary remains indistinct with cephalization. Unchanged bones. Post lower cervical ACDF, incompletely evaluated. IMPRESSION: 1. Interval reduction/resolution of left-sided pleural effusion post thoracentesis. No pneumothorax. 2. Similar findings of cardiomegaly, pulmonary  edema, small right-sided effusion and right basilar opacities, likely atelectasis. Electronically Signed   By: Sandi Mariscal M.D.   On: 10/13/2018 14:23   Dg Chest Port 1 View  Result Date: 10/09/2018 CLINICAL DATA:  Shortness of breath and chest pain, history of recent thoracentesis EXAM: PORTABLE CHEST 1 VIEW COMPARISON:  Film from earlier in the same day. FINDINGS: Cardiac shadow is again enlarged. Left-sided pleural effusion is again seen and slightly larger than that noted on the prior study. Dialysis catheter and right-sided chest wall port are again seen. No pneumothorax is noted. Mild vascular congestion is seen. IMPRESSION: Slight increase in left-sided pleural effusion. This may be positional in nature. Mild vascular congestion.  No pneumothorax is noted. Electronically Signed   By: Inez Catalina M.D.   On: 10/09/2018 19:45   Dg Chest Port 1 View  Result Date: 10/09/2018 CLINICAL DATA:  Status post right thoracentesis EXAM: PORTABLE CHEST 1 VIEW COMPARISON:  10/08/2018 FINDINGS: Cardiac shadow is stable. Dialysis catheter and right-sided chest wall port are again seen and stable.  Postsurgical changes in the cervical spine are noted. Small left pleural effusion is noted. Right-sided pleural effusion has resolved following thoracentesis. No pneumothorax is noted. IMPRESSION: No pneumothorax following right thoracentesis. Small left pleural effusion. Electronically Signed   By: Inez Catalina M.D.   On: 10/09/2018 16:53   Dg Chest Port 1 View  Result Date: 10/08/2018 CLINICAL DATA:  Shortness of breath. EXAM: PORTABLE CHEST 1 VIEW COMPARISON:  Chest x-ray dated August 28, 2018. FINDINGS: Unchanged right chest wall port catheter and tunneled left internal jugular dialysis catheter. Stable cardiomegaly and pulmonary vascular congestion. Unchanged small bilateral pleural effusions. Slightly increased bibasilar atelectasis. No pneumothorax. No acute osseous abnormality. IMPRESSION: 1. Mild congestive heart failure, similar to prior study. Electronically Signed   By: Titus Dubin M.D.   On: 10/08/2018 08:07   US Thoracentesis Asp Pleural Space W/img Guide  Result Date: 10/13/2018 INDICATION: Symptomatic left-sided pleural effusion. Please from ultrasound-guided thoracentesis for therapeutic and diagnostic purposes. EXAM: US THORACENTESIS ASP PLEURAL SPACE W/IMG GUIDE COMPARISON:  Chest CT-10/12/2018; chest radiograph-10/10/2018; ultrasound-guided right-sided thoracentesis yielding 700 cc of pleural fluid-10/09/2018 MEDICATIONS: None. COMPLICATIONS: None immediate. TECHNIQUE: Informed written consent was obtained from the patient after a discussion of the risks, benefits and alternatives to treatment. A timeout was performed prior to the initiation of the procedure. Initial ultrasound scanning demonstrates a moderate-sized bilateral pleural effusions. Patient requested left-sided ultrasound-guided thoracentesis. As such, the left lower chest was prepped and draped in the usual sterile fashion. 1% lidocaine was used for local anesthesia. An ultrasound image was saved for documentation purposes.  An 8 Fr Safe-T-Centesis catheter was introduced. The thoracentesis was performed. The catheter was removed and a dressing was applied. The patient tolerated the procedure well without immediate post procedural complication. The patient was escorted to have an upright chest radiograph. FINDINGS: A total of approximately 500 liters of serous fluid was removed. Requested samples were sent to the laboratory. IMPRESSION: Successful ultrasound-guided left sided thoracentesis yielding 500 cc of pleural fluid. Electronically Signed   By: Sandi Mariscal M.D.   On: 10/13/2018 14:45   US Thoracentesis Asp Pleural Space W/img Guide  Result Date: 10/09/2018 INDICATION: History of end-stage renal disease, now with symptomatic pleural effusions. Please perform chest ultrasound and ultrasound-guided thoracentesis of the side with greater volume of fluid for therapeutic and diagnostic purposes. EXAM: US THORACENTESIS ASP PLEURAL SPACE W/IMG GUIDE COMPARISON:  Chest radiograph-10/08/2018; CT abdomen pelvis-09/21/2018 MEDICATIONS: None. COMPLICATIONS: None  immediate. TECHNIQUE: Informed written consent was obtained from the patient after a discussion of the risks, benefits and alternatives to treatment. A timeout was performed prior to the initiation of the procedure. Initial ultrasound scanning demonstrates small bilateral pleural effusions, right greater than left. As such the decision was made to proceed with ultrasound-guided right-sided thoracentesis. With the patient positioned left lateral decubitus, the posterolateral aspect of the right chest was prepped and draped in the usual sterile fashion. 1% lidocaine was used for local anesthesia. An ultrasound image was saved for documentation purposes. An 8 Fr Safe-T-Centesis catheter was introduced. The thoracentesis was performed. The catheter was removed and a dressing was applied. The patient tolerated the procedure well without immediate post procedural complication. The  patient was escorted to have an upright chest radiograph. FINDINGS: A total of approximately 700 cc liters of serous fluid was removed. Requested samples were sent to the laboratory. IMPRESSION: Successful ultrasound-guided right sided thoracentesis yielding 700 cc of pleural fluid. Electronically Signed   By: Sandi Mariscal M.D.   On: 10/09/2018 17:01    ONCOLOGY HISTORY: Patient underwent debulking surgery at [facility] on November 19, 2016. She received one infusion preoperative infusion of Keytruda on November 11, 2016 as part of a clinical trial.  She initiated adjuvant carboplatinum and Taxol on December 16, 2016.  This was continued through April 28, 2017 at which time Taxol was discontinued secondary to worsening neuropathy.  Patient then received single agent carboplatinum for additional 2 infusions.  She was then noted to have progression of disease and was switched to Avastin and doxorubicin on August 18, 2017 this was discontinued on November 24, 2017 secondary to declining performance status.  Palliative gemcitabine was initiated on February 20, 2018 which was then discontinued Jul 27, 2018 secondary to declining performance status and end-stage renal disease.  Gemcitabine was reinitiated on September 24, 2018.   ASSESSMENT: Stage IIIc high-grade serous ovarian carcinoma  PLAN:  1. Stage IIIc high-grade serous ovarian carcinoma: See oncology history as above. Her most recent CA-125 is decreased slightly to 730.0, today's result is pending.  She expressed understanding that her options are limited particularly now that she has dialysis.  Plan to proceed with gemcitabine on days 1, 8, and 15 with a 22 off as previous.  Could also attempt Taxol or Taxotere, but there is concern that this would make her peripheral neuropathy worse.  BRCA and germline testing were negative on initial pathology.  Will delay her next infusion of gemcitabine.  Patient will return to clinic on October 27, 2018 for further  evaluation and consideration of treatment.   2.  Shortness of breath: Likely multifactorial.  Continue dialysis 3 days a week as planned on Tuesday, Thursday, and Saturday.  Patient also has significant pulmonary hypertension that is likely contributing.  Continue oxygen 24 hours/day as prescribed. 3.  Acute renal failure: Continue dialysis as above. 4.  Pain: Patient does not complain of this today.  Continue oxycodone as needed. 5.  Anemia: Patient's hemoglobin is decreased, but stable at 9.6.  Okay to give Procrit with dialysis.  Continue to monitor closely and will transfuse if it falls below 7.0.  This will likely have to be done in conjunction with dialysis given her current fluid overload. 6.  Thrombocytopenia: Resolved.   Lloyd Huger, MD   10/24/2018 10:32 AM

## 2018-10-26 ENCOUNTER — Other Ambulatory Visit: Payer: Self-pay

## 2018-10-26 NOTE — Progress Notes (Signed)
Follow-up Outpatient Visit Date: 10/28/2018  Primary Care Provider: Patient, No Pcp Per No address on file  Chief Complaint: Fatigue  HPI:  Jessica Burgess is a 63 y.o. year-old female with history of history of chronic HFpEF and severe pulmonary hypertension, paroxysmal atrial fibrillation, COPD, stage IIIC high grade serous ovarian cancer, and ESRD on hemodialysis, who presents for follow-up of recent hospitalization with atrial fibrillation and acute on chronic HFpEF.  She was loaded with amiodarone, leading to conversion back to sinus rhythm.  Apixaban 5 mg BID was continued for anticoagulation.  Today, Jessica Burgess reports that she continues to feel weaker and more fatigue.  She does not walk much outside of her home.  She denies chest pain, palpitations, and lightheadedness.  She continues to have significant leg edema despite remaining on torsemide and undergoing regular hemodialysis.  She does not use compression stockings, as she says these simply push the fluid elsewhere and cause her knees to "mushroom."  She remains on supplemental oxygen with exertional dyspnea with minimal activity.  She also notes multifocal abdominal pain/tenderness.  She has a knot in the LLQ and wonders if it could be a hernia.  She has not had any significant bleeding, remaining on apixaban.  --------------------------------------------------------------------------------------------------  Past Medical History:  Diagnosis Date  . Allergic rhinitis, cause unspecified   . Anxiety state, unspecified   . Arthritis   . Asthma    only when sick   . Backache, unspecified   . Bronchitis    hx of when get sick  . Cancer ([place])    skin cancer , basal cell   . Cancer ([place]) 11/2016   ovarian  . Cervicalgia   . CHF (congestive heart failure) ([place])   . Complication of anesthesia   . Dermatophytosis of nail   . Dysmetabolic syndrome X   . Encounter for long-term (current) use of other medications   . Esophageal  reflux   . Hypertension   . Insomnia, unspecified   . Leukocytosis, unspecified   . Migraine without aura, without mention of intractable migraine without mention of status migrainosus   . Other and unspecified hyperlipidemia   . Other malaise and fatigue   . Overweight(278.02)   . Personal history of chemotherapy now   ovarian  . PONV (postoperative nausea and vomiting)   . Renal insufficiency   . Spinal stenosis in cervical region   . Symptomatic menopausal or female climacteric states   . Unspecified disorder of skin and subcutaneous tissue   . Unspecified vitamin D deficiency    Past Surgical History:  Procedure Laterality Date  . ABDOMINAL HYSTERECTOMY    . ANTERIOR CERVICAL DECOMP/DISCECTOMY FUSION N/A 06/07/2015   Procedure: Cervical three - four and Cervical six- seven anterior cervical decompression with fusion interbody prosthesis plating and bonegraft;  Surgeon: Newman Pies, MD;  Location: [facility];  Service: Neurosurgery;  Laterality: N/A;  C34 and C67 anterior cervical decompression with fusion interbody prosthesis plating and bonegraft  . BACK SURGERY     x2 Lower   . DIALYSIS/PERMA CATHETER INSERTION N/A 08/17/2018   Procedure: DIALYSIS/PERMA CATHETER INSERTION;  Surgeon: Algernon Huxley, MD;  Location: [facility];  Service: Cardiovascular;  Laterality: N/A;  . EVACUATION OF CERVICAL HEMATOMA N/A 06/14/2015   Procedure: EVACUATION OF CERVICAL HEMATOMA;  Surgeon: Newman Pies, MD;  Location: [facility];  Service: Neurosurgery;  Laterality: N/A;  . NECK SURGERY     x3  . TONSILLECTOMY  Current Meds  Medication Sig  . albuterol (PROVENTIL HFA;VENTOLIN HFA) 108 (90 Base) MCG/ACT inhaler Inhale 2 puffs into the lungs every 6 (six) hours as needed for wheezing or shortness of breath.  Marland Kitchen albuterol (PROVENTIL) (2.5 MG/3ML) 0.083% nebulizer solution Take 3 mLs (2.5 mg total) by nebulization every 6 (six) hours as needed for wheezing or shortness of  breath.  . ALPRAZolam (XANAX) 0.25 MG tablet Take 1 tablet (0.25 mg total) by mouth at bedtime as needed for anxiety.  Marland Kitchen apixaban (ELIQUIS) 5 MG TABS tablet Take 1 tablet (5 mg total) by mouth 2 (two) times daily.  . cetirizine (ZYRTEC) 10 MG tablet Take 10 mg by mouth daily.  . Cholecalciferol (VITAMIN D-1000 MAX ST) 1000 UNITS tablet Take 1,000 Units by mouth 2 (two) times daily.   . cyclobenzaprine (FLEXERIL) 10 MG tablet Take 1 tablet (10 mg total) by mouth every 8 (eight) hours as needed for muscle spasms.  . DULoxetine (CYMBALTA) 60 MG capsule Take 1 capsule (60 mg total) by mouth daily.  . ferrous sulfate (IRON SUPPLEMENT) 325 (65 FE) MG tablet Take 1 tablet by mouth daily.  . hydrALAZINE (APRESOLINE) 25 MG tablet Take 1 tablet (25 mg total) by mouth 3 (three) times daily. For BP if above 150/90  . ipratropium (ATROVENT HFA) 17 MCG/ACT inhaler Inhale 2 puffs into the lungs every 6 (six) hours as needed for wheezing.   . isosorbide mononitrate (IMDUR) 30 MG 24 hr tablet Take 1 tablet (30 mg total) by mouth daily.  Marland Kitchen levothyroxine (SYNTHROID) 25 MCG tablet Take 1 tablet (25 mcg total) by mouth daily before breakfast.  . lidocaine (XYLOCAINE) 2 % solution Use as directed 15 mLs in the mouth or throat as needed for mouth pain.  Marland Kitchen losartan (COZAAR) 100 MG tablet Take 1 tablet (100 mg total) by mouth daily.  Marland Kitchen Lysine 1000 MG TABS Take 1,000 mg by mouth daily.   . Magnesium Oxide 400 (240 Mg) MG TABS Take 400 mg by mouth 2 (two) times daily.   . metoCLOPramide (REGLAN) 5 MG tablet Take 1 tablet (5 mg total) by mouth every 8 (eight) hours as needed for nausea or vomiting.  . mometasone-formoterol (DULERA) 200-5 MCG/ACT AERO Inhale 2 puffs into the lungs 2 (two) times daily.  . Multiple Vitamins-Minerals (MULTIVITAMIN PO) Take 1 tablet by mouth daily.   . Omega-3 Fatty Acids (FISH OIL) 1000 MG CAPS Take 1,000 capsules by mouth 2 (two) times daily.   . ondansetron (ZOFRAN-ODT) 8 MG disintegrating  tablet Take 1 tablet (8 mg total) by mouth every 8 (eight) hours as needed for nausea or vomiting.  Marland Kitchen oxyCODONE-acetaminophen (PERCOCET) 10-325 MG tablet Take 1 tablet by mouth every 6 (six) hours as needed for pain.  . polyethylene glycol powder (GLYCOLAX/MIRALAX) 17 GM/SCOOP powder Take 17 g by mouth daily as needed for mild constipation or moderate constipation.   . SUMAtriptan (IMITREX) 100 MG tablet May repeat in 2 hours if headache persists or recurs. (Patient taking differently: Take 100 mg by mouth daily as needed for migraine. May repeat in 2 hours if headache persists or recurs.)  . temazepam (RESTORIL) 30 MG capsule Take 1 capsule (30 mg total) by mouth at bedtime as needed for sleep.  Marland Kitchen torsemide (DEMADEX) 20 MG tablet Take 2 tablets (40 mg total) by mouth 2 (two) times daily.  . vitamin B-12 (CYANOCOBALAMIN) 1000 MCG tablet Take 1,000 mcg by mouth daily.  . vitamin C (ASCORBIC ACID) 500 MG tablet Take 500  mg by mouth daily.  . [DISCONTINUED] amiodarone (PACERONE) 200 MG tablet Take 1 tablet (200 mg total) by mouth 2 (two) times daily.  . [DISCONTINUED] metoprolol succinate (TOPROL-XL) 100 MG 24 hr tablet Take 1 tablet (100 mg total) by mouth daily. Take with or immediately following a meal.    Allergies: Patient has no known allergies.  Social History   Tobacco Use  . Smoking status: Former Smoker    Packs/day: 1.50    Years: 20.00    Pack years: 30.00    Types: Cigarettes    Start date: 03/19/1979    Quit date: 08/25/1999    Years since quitting: 19.1  . Smokeless tobacco: Never Used  . Tobacco comment: smoking cessation materials not required  Substance Use Topics  . Alcohol use: Not Currently    Alcohol/week: 0.0 standard drinks  . Drug use: No    Family History  Problem Relation Age of Onset  . Depression Mother   . Migraines Mother   . Dementia Father   . Diabetes Father   . Hyperlipidemia Father   . Hyperlipidemia Brother   . Hyperlipidemia Brother   . Breast  cancer Paternal Aunt        64s    Review of Systems: A 12-system review of systems was performed and was negative except as noted in the HPI.  --------------------------------------------------------------------------------------------------  Physical Exam: BP (!) 168/70 (BP Location: Right Arm, Patient Position: Sitting, Cuff Size: Normal)   Pulse (!) 51   Ht 5\' 2"  (1.575 m)   Wt 149 lb 12 oz (67.9 kg)   BMI 27.39 kg/m   General:  Tired-appearing woman, seated in wheelchair. HEENT: No conjunctival pallor or scleral icterus. Facemask in place Neck: Supple without lymphadenopathy, thyromegaly, JVD, or HJR. Lungs: Normal work of breathing. Mildly diminished at the lung bases but otherwise good air movement without wheezes or crackles. Heart: Bradycardic but regular without murmurs, rubs, or gallops. Non-displaced PMI. Abd: Bowel sounds present. Soft, NT/ND without hepatosplenomegaly.  Small, firm soft tissue nodule in LLQ. Ext: 1-2+ pretibial edema. Skin: Warm and dry without rash.  EKG:  Sinus bradycardia.  Low voltage.  Lab Results  Component Value Date   WBC 4.6 10/27/2018   HGB 9.2 (L) 10/27/2018   HCT 29.7 (L) 10/27/2018   MCV 101.7 (H) 10/27/2018   PLT 323 10/27/2018    Lab Results  Component Value Date   NA 137 10/27/2018   K 4.1 10/27/2018   CL 101 10/27/2018   CO2 29 10/27/2018   BUN 16 10/27/2018   CREATININE 2.13 (H) 10/27/2018   GLUCOSE 124 (H) 10/27/2018   ALT 10 10/27/2018    Lab Results  Component Value Date   CHOL 131 05/17/2016   HDL 48 (L) 05/17/2016   LDLCALC 49 05/17/2016   TRIG 171 (H) 05/17/2016   CHOLHDL 2.7 05/17/2016    --------------------------------------------------------------------------------------------------  ASSESSMENT AND PLAN: Right heart failure and severe pulmonary hypertension: Jessica Burgess still appears volume overloaded, which is likely multifactorial (right heart failure with severe pulmonary hypertension, anemia,  and hypoalbuminemia).  I recommend continued diuresis with torsemide 40 mg BID and removal of as much fluid as tolerated with hemodialysis.  We discussed pursuing right heart catheterization to better understand Jessica Burgess hemodynamics and potential etiologies for her [place], but she would like to avoid invasive procedures, if possible.  Paroxysmal atrial fibrillation: EKG today shows sinus rhythm.  Some of her fatigue could be attributed to excessive beta blockade and amiodarone use.  We will decrease metoprolol succinate to 50 mg daily and amiodarone to 200 mg daily.  Jessica Burgess should continue apixaban 5 mg BID.  Hypertension: BP poorly controlled today.  We will decrease metoprolol, as above.  Jessica Burgess may need to use prn hydralazine more frequently.  I am reluctant to increase standing BP medications in order to avoid hypotension with HD.  LLQ nodule: Location and exam are most suggestive of injection site granuloma from one of her many hospitalizations.  I will defer further workup at this time.  Goals of care: Jessica Burgess brings up the possibility of hospice.  Given her advanced ovarian cancer, Neddie Steedman-stage renal disease, and right heart failure with severe pulmonary hypertension, I think that her prognosis is not good.  If she wishes to pursue hospice, she should reach out to [place] or Dr. Grayland Ormond to make arrangements for this.  If she elects to terminate dialysis, I think her life expectancy would be measured in days to weeks.  Follow-up: Return to clinic in 6 weeks.  Nelva Bush, MD 10/28/2018 8:54 PM

## 2018-10-27 ENCOUNTER — Inpatient Hospital Stay (HOSPITAL_BASED_OUTPATIENT_CLINIC_OR_DEPARTMENT_OTHER): Payer: PPO | Admitting: Oncology

## 2018-10-27 ENCOUNTER — Inpatient Hospital Stay: Payer: PPO | Admitting: Hospice and Palliative Medicine

## 2018-10-27 ENCOUNTER — Other Ambulatory Visit: Payer: Self-pay

## 2018-10-27 ENCOUNTER — Inpatient Hospital Stay: Payer: PPO

## 2018-10-27 ENCOUNTER — Encounter: Payer: Self-pay | Admitting: Oncology

## 2018-10-27 ENCOUNTER — Inpatient Hospital Stay: Payer: PPO | Admitting: Oncology

## 2018-10-27 VITALS — BP 147/77 | HR 52 | Temp 97.5°F | Wt 148.0 lb

## 2018-10-27 DIAGNOSIS — C569 Malignant neoplasm of unspecified ovary: Secondary | ICD-10-CM

## 2018-10-27 DIAGNOSIS — Z95828 Presence of other vascular implants and grafts: Secondary | ICD-10-CM

## 2018-10-27 DIAGNOSIS — Z515 Encounter for palliative care: Secondary | ICD-10-CM | POA: Diagnosis not present

## 2018-10-27 LAB — COMPREHENSIVE METABOLIC PANEL
ALT: 10 U/L (ref 0–44)
AST: 17 U/L (ref 15–41)
Albumin: 2.7 g/dL — ABNORMAL LOW (ref 3.5–5.0)
Alkaline Phosphatase: 87 U/L (ref 38–126)
Anion gap: 7 (ref 5–15)
BUN: 16 mg/dL (ref 8–23)
CO2: 29 mmol/L (ref 22–32)
Calcium: 8 mg/dL — ABNORMAL LOW (ref 8.9–10.3)
Chloride: 101 mmol/L (ref 98–111)
Creatinine, Ser: 2.13 mg/dL — ABNORMAL HIGH (ref 0.44–1.00)
GFR calc Af Amer: 28 mL/min — ABNORMAL LOW (ref >60)
GFR calc non Af Amer: 24 mL/min — ABNORMAL LOW (ref >60)
Glucose, Bld: 124 mg/dL — ABNORMAL HIGH (ref 70–99)
Potassium: 4.1 mmol/L (ref 3.5–5.1)
Sodium: 137 mmol/L (ref 135–145)
Total Bilirubin: 0.4 mg/dL (ref 0.3–1.2)
Total Protein: 6.1 g/dL — ABNORMAL LOW (ref 6.5–8.1)

## 2018-10-27 LAB — CBC WITH DIFFERENTIAL/PLATELET
Abs Immature Granulocytes: 0.04 10*3/uL (ref 0.00–0.07)
Basophils Absolute: 0.1 10*3/uL (ref 0.0–0.1)
Basophils Relative: 1 %
Eosinophils Absolute: 0.6 10*3/uL — ABNORMAL HIGH (ref 0.0–0.5)
Eosinophils Relative: 13 %
HCT: 29.7 % — ABNORMAL LOW (ref 36.0–46.0)
Hemoglobin: 9.2 g/dL — ABNORMAL LOW (ref 12.0–15.0)
Immature Granulocytes: 1 %
Lymphocytes Relative: 20 %
Lymphs Abs: 0.9 10*3/uL (ref 0.7–4.0)
MCH: 31.5 pg (ref 26.0–34.0)
MCHC: 31 g/dL (ref 30.0–36.0)
MCV: 101.7 fL — ABNORMAL HIGH (ref 80.0–100.0)
Monocytes Absolute: 1.1 10*3/uL — ABNORMAL HIGH (ref 0.1–1.0)
Monocytes Relative: 24 %
Neutro Abs: 1.9 10*3/uL (ref 1.7–7.7)
Neutrophils Relative %: 41 %
Platelets: 323 10*3/uL (ref 150–400)
RBC: 2.92 MIL/uL — ABNORMAL LOW (ref 3.87–5.11)
RDW: 15.8 % — ABNORMAL HIGH (ref 11.5–15.5)
WBC: 4.6 10*3/uL (ref 4.0–10.5)
nRBC: 0 % (ref 0.0–0.2)

## 2018-10-27 LAB — SAMPLE TO BLOOD BANK

## 2018-10-27 MED ORDER — HEPARIN SOD (PORK) LOCK FLUSH 100 UNIT/ML IV SOLN
500.0000 [IU] | Freq: Once | INTRAVENOUS | Status: AC
  Administered 2018-10-27: 15:00:00 500 [IU] via INTRAVENOUS

## 2018-10-27 MED ORDER — SODIUM CHLORIDE 0.9% FLUSH
10.0000 mL | Freq: Once | INTRAVENOUS | Status: AC
  Administered 2018-10-27: 10 mL via INTRAVENOUS
  Filled 2018-10-27: qty 10

## 2018-10-27 NOTE — Progress Notes (Signed)
Patient stated that she had been feeling bad. Patient also stated that she is not wanting chemo today.

## 2018-10-28 ENCOUNTER — Ambulatory Visit (INDEPENDENT_AMBULATORY_CARE_PROVIDER_SITE_OTHER): Payer: PPO | Admitting: Internal Medicine

## 2018-10-28 ENCOUNTER — Encounter: Payer: Self-pay | Admitting: Internal Medicine

## 2018-10-28 ENCOUNTER — Inpatient Hospital Stay: Payer: PPO

## 2018-10-28 VITALS — BP 168/70 | HR 51 | Ht 62.0 in | Wt 149.8 lb

## 2018-10-28 DIAGNOSIS — I50812 Chronic right heart failure: Secondary | ICD-10-CM | POA: Diagnosis not present

## 2018-10-28 DIAGNOSIS — I480 Paroxysmal atrial fibrillation: Secondary | ICD-10-CM

## 2018-10-28 DIAGNOSIS — I10 Essential (primary) hypertension: Secondary | ICD-10-CM | POA: Diagnosis not present

## 2018-10-28 DIAGNOSIS — I272 Pulmonary hypertension, unspecified: Secondary | ICD-10-CM

## 2018-10-28 MED ORDER — METOPROLOL SUCCINATE ER 50 MG PO TB24: 50.0000 mg | ORAL_TABLET | Freq: Every day | ORAL | 3 refills | Status: DC

## 2018-10-28 MED ORDER — AMIODARONE HCL 200 MG PO TABS: 200.0000 mg | ORAL_TABLET | Freq: Every day | ORAL | 3 refills | Status: AC

## 2018-10-28 NOTE — Patient Instructions (Signed)
Medication Instructions:  Your physician has recommended you make the following change in your medication:  1- DECREASE Amiodarone to 200 mg by mouth once a day. 2- DECREASE Metoprolol to 50 mg by mouth once a day.   If you need a refill on your cardiac medications before your next appointment, please call your pharmacy.   Lab work: - None ordered.  If you have labs (blood work) drawn today and your tests are completely normal, you will receive your results only by: Marland Kitchen MyChart Message (if you have MyChart) OR . A paper copy in the mail If you have any lab test that is abnormal or we need to change your treatment, we will call you to review the results.  Testing/Procedures: - None ordered.   Follow-Up: At [facility], you and your health needs are our priority.  As part of our continuing mission to provide you with exceptional heart care, we have created designated Provider Care Teams.  These Care Teams include your primary Cardiologist (physician) and Advanced Practice Providers (APPs -  Physician Assistants and Nurse Practitioners) who all work together to provide you with the care you need, when you need it. You will need a follow up appointment in 6 weeks.  Please call our office 2 months in advance to schedule this appointment.  You may see Nelva Bush, MD or one of the following Advanced Practice Providers on your designated Care Team:   Murray Hodgkins, NP Christell Faith, PA-C . Marrianne Mood, PA-C

## 2018-10-28 NOTE — Progress Notes (Signed)
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  INTERVAL HISTORY: Patient returns to clinic today for further evaluation and consideration of her next infusion of gemcitabine.  She continues to have significant weakness and fatigue and a decreased performance status.  Her shortness of breath is chronic and unchanged. She denies any fevers. She has no neurologic complaints.  She denies any chest pain, cough, or hemoptysis.  She has occasional nausea, but denies any vomiting, constipation, or diarrhea.  She denies any abdominal pain or bloating.  She has no urinary complaints.  Patient feels generally terrible, but offers no further specific complaints today.  REVIEW OF SYSTEMS:   Review of Systems  Constitutional: Positive for malaise/fatigue. Negative for fever and weight loss.  HENT: Negative.  Negative for sore throat.   Eyes: Negative.  Negative for blurred vision.  Respiratory: Positive for shortness of breath. Negative for cough.   Cardiovascular: Negative.  Negative for chest pain and leg swelling.  Gastrointestinal: Positive for nausea. Negative for abdominal pain, blood in stool, constipation, diarrhea, melena and vomiting.  Genitourinary: Negative.  Negative for dysuria.  Musculoskeletal: Negative.  Negative for back pain, joint pain and neck pain.  Skin: Negative.  Negative  for itching and rash.  Neurological: Positive for tingling, sensory change and weakness. Negative for focal weakness.  Psychiatric/Behavioral: Negative.  The patient is not nervous/anxious.      As per HPI. Otherwise, a complete review of systems is negative.  PAST MEDICAL HISTORY: Past Medical History:  Diagnosis Date   Allergic rhinitis, cause unspecified    Anxiety state, unspecified    Arthritis    Asthma    only when sick    Backache, unspecified    Bronchitis    hx of when get sick   Cancer [facility])    skin cancer , basal cell    Cancer ([place]) 11/2016   ovarian   Cervicalgia    CHF (congestive heart failure) (HCC)    Complication of anesthesia    Dermatophytosis of nail    Dysmetabolic syndrome X    Encounter for long-term (current) use of other medications    Esophageal reflux    Hypertension    Insomnia, unspecified    Leukocytosis, unspecified    Migraine without aura, without mention of intractable migraine without mention of status migrainosus    Other and unspecified hyperlipidemia    Other malaise and fatigue    Overweight(278.02)    Personal history of chemotherapy now   ovarian   PONV (postoperative nausea and vomiting)    Renal insufficiency    Spinal stenosis in cervical region    Symptomatic menopausal or female climacteric states    Unspecified disorder of skin and subcutaneous tissue    Unspecified vitamin D deficiency     PAST SURGICAL HISTORY: Past Surgical History:  Procedure Laterality Date   ABDOMINAL HYSTERECTOMY     ANTERIOR CERVICAL DECOMP/DISCECTOMY FUSION N/A 06/07/2015   Procedure: Cervical three - four and Cervical  six- seven anterior cervical decompression with fusion interbody prosthesis plating and bonegraft;  Surgeon: Newman Pies, MD;  Location: [place] [facility];  Service: Neurosurgery;  Laterality: N/A;  C34 and C67 anterior cervical decompression with fusion interbody prosthesis plating and bonegraft     BACK SURGERY     x2 Lower    DIALYSIS/PERMA CATHETER INSERTION N/A 08/17/2018   Procedure: DIALYSIS/PERMA CATHETER INSERTION;  Surgeon: Algernon Huxley, MD;  Location: [place] [facility];  Service: Cardiovascular;  Laterality: N/A;   EVACUATION OF CERVICAL HEMATOMA N/A 06/14/2015   Procedure: EVACUATION OF CERVICAL HEMATOMA;  Surgeon: Newman Pies, MD;  Location: [place] [facility];  Service: Neurosurgery;  Laterality: N/A;   NECK SURGERY     x3   TONSILLECTOMY      FAMILY HISTORY: Family History  Problem Relation Age of Onset   Depression Mother    Migraines Mother    Dementia Father    Diabetes Father    Hyperlipidemia Father    Hyperlipidemia Brother    Hyperlipidemia Brother    Breast cancer Paternal Aunt        94s    ADVANCED DIRECTIVES (Y/N):  N  HEALTH MAINTENANCE: Social History   Tobacco Use   Smoking status: Former Smoker    Packs/day: 1.50    Years: 20.00    Pack years: 30.00    Types: Cigarettes    Start date: 03/19/1979    Quit date: 08/25/1999    Years since quitting: 19.1   Smokeless tobacco: Never Used   Tobacco comment: smoking cessation materials not required  Substance Use Topics   Alcohol use: Not Currently    Alcohol/week: 0.0 standard drinks   Drug use: No     Colonoscopy:  PAP:  Bone density:  Lipid panel:  No Known Allergies  Current Outpatient Medications  Medication Sig Dispense Refill   albuterol (PROVENTIL HFA;VENTOLIN HFA) 108 (90 Base) MCG/ACT inhaler Inhale 2 puffs into the lungs every 6 (six) hours as needed for wheezing or shortness of breath. 1 Inhaler 2   albuterol (PROVENTIL) (2.5 MG/3ML) 0.083% nebulizer solution Take 3 mLs (2.5 mg total) by nebulization every 6 (six) hours as needed for wheezing or shortness of breath. 75 mL 2   ALPRAZolam (XANAX) 0.25 MG tablet Take 1 tablet (0.25 mg total) by mouth at bedtime as needed for anxiety. 30 tablet 0   amiodarone (PACERONE) 200 MG tablet Take 1 tablet (200 mg  total) by mouth 2 (two) times daily. 60 tablet 0   apixaban (ELIQUIS) 5 MG TABS tablet Take 1 tablet (5 mg total) by mouth 2 (two) times daily. 60 tablet 0   cetirizine (ZYRTEC) 10 MG tablet Take 10 mg by mouth daily.     Cholecalciferol (VITAMIN D-1000 MAX ST) 1000 UNITS tablet Take 1,000 Units by mouth 2 (two) times daily.      cyclobenzaprine (FLEXERIL) 10 MG tablet Take 1 tablet (10 mg total) by mouth every 8 (eight) hours as needed for muscle spasms. 30 tablet 1   DULoxetine (CYMBALTA) 60 MG capsule Take 1 capsule (60 mg total) by mouth daily. 90 capsule 0   ferrous sulfate (IRON SUPPLEMENT) 325 (65 FE) MG tablet Take 1 tablet by mouth daily.     hydrALAZINE (APRESOLINE) 25 MG tablet Take 1 tablet (25 mg total) by mouth 3 (three) times daily. For BP if above 150/90 90 tablet 0   ipratropium (ATROVENT HFA) 17 MCG/ACT inhaler Inhale 2 puffs into the lungs every 6 (six) hours  as needed for wheezing.      isosorbide mononitrate (IMDUR) 30 MG 24 hr tablet Take 1 tablet (30 mg total) by mouth daily. 30 tablet 2   levothyroxine (SYNTHROID) 25 MCG tablet Take 1 tablet (25 mcg total) by mouth daily before breakfast. 30 tablet 2   lidocaine (XYLOCAINE) 2 % solution Use as directed 15 mLs in the mouth or throat as needed for mouth pain. 100 mL 0   losartan (COZAAR) 100 MG tablet Take 1 tablet (100 mg total) by mouth daily. 30 tablet 0   Lysine 1000 MG TABS Take 1,000 mg by mouth daily.      Magnesium Oxide 400 (240 Mg) MG TABS Take 400 mg by mouth 2 (two) times daily.      metoCLOPramide (REGLAN) 5 MG tablet Take 1 tablet (5 mg total) by mouth every 8 (eight) hours as needed for nausea or vomiting. 90 tablet 1   metoprolol succinate (TOPROL-XL) 100 MG 24 hr tablet Take 1 tablet (100 mg total) by mouth daily. Take with or immediately following a meal. 30 tablet 0   mometasone-formoterol (DULERA) 200-5 MCG/ACT AERO Inhale 2 puffs into the lungs 2 (two) times daily.     Multiple  Vitamins-Minerals (MULTIVITAMIN PO) Take 1 tablet by mouth daily.      Omega-3 Fatty Acids (FISH OIL) 1000 MG CAPS Take 1,000 capsules by mouth 2 (two) times daily.      ondansetron (ZOFRAN-ODT) 8 MG disintegrating tablet Take 1 tablet (8 mg total) by mouth every 8 (eight) hours as needed for nausea or vomiting. 60 tablet 3   oxyCODONE-acetaminophen (PERCOCET) 10-325 MG tablet Take 1 tablet by mouth every 6 (six) hours as needed for pain. 60 tablet 0   polyethylene glycol powder (GLYCOLAX/MIRALAX) 17 GM/SCOOP powder Take 17 g by mouth daily as needed for mild constipation or moderate constipation.      SUMAtriptan (IMITREX) 100 MG tablet May repeat in 2 hours if headache persists or recurs. (Patient taking differently: Take 100 mg by mouth daily as needed for migraine. May repeat in 2 hours if headache persists or recurs.) 9 tablet 1   temazepam (RESTORIL) 30 MG capsule Take 1 capsule (30 mg total) by mouth at bedtime as needed for sleep. 30 capsule 0   torsemide (DEMADEX) 20 MG tablet Take 2 tablets (40 mg total) by mouth 2 (two) times daily. 120 tablet 0   vitamin B-12 (CYANOCOBALAMIN) 1000 MCG tablet Take 1,000 mcg by mouth daily.     vitamin C (ASCORBIC ACID) 500 MG tablet Take 500 mg by mouth daily.     fluticasone (FLONASE) 50 MCG/ACT nasal spray Place 2 sprays into both nostrils as needed. 48 g 1   No current facility-administered medications for this visit.    Facility-Administered Medications Ordered in Other Visits  Medication Dose Route Frequency Provider Last Rate Last Dose   0.9 %  sodium chloride infusion   Intravenous Once Grayland Ormond, Kathlene November, MD       0.9 %  sodium chloride infusion   Intravenous Once Lloyd Huger, MD       sodium chloride flush (NS) 0.9 % injection 10 mL  10 mL Intravenous PRN Lloyd Huger, MD   10 mL at 06/16/17 0854    OBJECTIVE: Vitals:   10/27/18 1352  BP: (!) 147/77  Pulse: (!) 52  Temp: (!) 97.5 F (36.4 C)     Body mass  index is 27.07 kg/m.    ECOG FS:3 - Symptomatic, >  50% confined to bed  General: Ill-appearing, mild respiratory distress.  Sitting in a wheelchair. Eyes: Pink conjunctiva, anicteric sclera. HEENT: Normocephalic, moist mucous membranes. Lungs: Clear to auscultation bilaterally. Heart: Regular rate and rhythm. No rubs, murmurs, or gallops. Abdomen: Soft, nontender, nondistended. No organomegaly noted, normoactive bowel sounds. Musculoskeletal: No edema, cyanosis, or clubbing. Neuro: Alert, answering all questions appropriately. Cranial nerves grossly intact. Skin: No rashes or petechiae noted. Psych: Normal affect.  LAB RESULTS:  Lab Results  Component Value Date   NA 137 10/27/2018   K 4.1 10/27/2018   CL 101 10/27/2018   CO2 29 10/27/2018   GLUCOSE 124 (H) 10/27/2018   BUN 16 10/27/2018   CREATININE 2.13 (H) 10/27/2018   CALCIUM 8.0 (L) 10/27/2018   PROT 6.1 (L) 10/27/2018   ALBUMIN 2.7 (L) 10/27/2018   AST 17 10/27/2018   ALT 10 10/27/2018   ALKPHOS 87 10/27/2018   BILITOT 0.4 10/27/2018   GFRNONAA 24 (L) 10/27/2018   GFRAA 28 (L) 10/27/2018    Lab Results  Component Value Date   WBC 4.6 10/27/2018   NEUTROABS 1.9 10/27/2018   HGB 9.2 (L) 10/27/2018   HCT 29.7 (L) 10/27/2018   MCV 101.7 (H) 10/27/2018   PLT 323 10/27/2018     STUDIES: Dg Chest 2 View  Result Date: 10/10/2018 CLINICAL DATA:  Ovarian cancer on chemotherapy, hypertension, CHF, asthma, end-stage renal disease on dialysis, former smoker EXAM: CHEST - 2 VIEW COMPARISON:  10/09/2018 FINDINGS: RIGHT jugular Port-A-Cath with tip projecting over SVC. LEFT jugular dual-lumen central venous catheter with tip above cavoatrial junction. Enlargement of cardiac silhouette with pulmonary vascular congestion. Stable mediastinal contours. Bibasilar effusions and mild LEFT basilar atelectasis. Minimal perihilar edema. No pneumothorax or acute osseous findings. IMPRESSION: Enlargement of cardiac silhouette with  pulmonary vascular congestion and mild pulmonary edema. Bibasilar effusions and LEFT LEFT basilar atelectasis. Electronically Signed   By: Lavonia Dana M.D.   On: 10/10/2018 17:28   Ct Chest Wo Contrast  Result Date: 10/12/2018 CLINICAL DATA:  Pleural effusion. Clinical sepsis and neutropenic fever. Status post thoracentesis. EXAM: CT CHEST WITHOUT CONTRAST TECHNIQUE: Multidetector CT imaging of the chest was performed following the standard protocol without IV contrast. COMPARISON:  Chest CT 08/09/2018 FINDINGS: Cardiovascular: The heart is enlarged but stable. No pericardial effusion. Stable tortuosity, ectasia and calcification of the thoracic aorta. Scattered coronary artery calcifications. Right-sided Port-A-Cath and left IJ dialysis catheter in good positions without complicating features. Mediastinum/Nodes: Stable scattered mediastinal and hilar lymph nodes without mass or overt adenopathy. The esophagus is grossly normal. Lungs/Pleura: Moderate-sized bilateral pleural effusions with overlying atelectasis. Patchy inflammatory changes in both lungs with areas of tree-in-bud appearance suggesting chronic inflammation or atypical infection such as MAC or viral pneumonitis. No worrisome pulmonary lesions. No endobronchial lesions are identified. Upper Abdomen: No significant upper abdominal findings. Musculoskeletal: No significant bony findings. No breast masses, supraclavicular or axillary adenopathy. IMPRESSION: 1. Moderate-sized bilateral pleural effusions with overlying atelectasis. 2. Patchy inflammatory or atypical infectious process in the lungs. No focal pneumonia. No worrisome pulmonary lesions. 3. Stable cardiac enlargement and tortuosity and calcification of the thoracic aorta. 4. Stable mediastinal and hilar lymph nodes without mass or overt adenopathy. Aortic Atherosclerosis (ICD10-I70.0). Electronically Signed   By: Marijo Sanes M.D.   On: 10/12/2018 12:26   US Abdomen Limited  Result Date:  10/09/2018 CLINICAL DATA:  History of end-stage renal disease, now with bilateral pleural effusions and intra-abdominal ascites. Please perform ascites search ultrasound and ultrasound-guided paracentesis as indicated.  EXAM: LIMITED ABDOMEN ULTRASOUND FOR ASCITES TECHNIQUE: Limited ultrasound survey for ascites was performed in all four abdominal quadrants. COMPARISON:  CT abdomen pelvis-09/21/2018 FINDINGS: Sonographic evaluation demonstrates a trace amount of intra-abdominal ascites, too small to allow for safe ultrasound-guided paracentesis. Note is made of small bilateral pleural effusions, right greater than left. IMPRESSION: 1. Trace amount of intra-abdominal ascites, too small to allow for safe ultrasound-guided paracentesis. 2. Note is made of small bilateral pleural effusions, right greater than left. Patient subsequently underwent ultrasound-guided right-sided thoracentesis. Electronically Signed   By: Sandi Mariscal M.D.   On: 10/09/2018 16:59   US Venous Img Lower Bilateral  Result Date: 10/09/2018 CLINICAL DATA:  Bilateral lower extremity pain and edema. EXAM: BILATERAL LOWER EXTREMITY VENOUS DOPPLER ULTRASOUND TECHNIQUE: Gray-scale sonography with graded compression, as well as color Doppler and duplex ultrasound were performed to evaluate the lower extremity deep venous systems from the level of the common femoral vein and including the common femoral, femoral, profunda femoral, popliteal and calf veins including the posterior tibial, peroneal and gastrocnemius veins when visible. The superficial great saphenous vein was also interrogated. Spectral Doppler was utilized to evaluate flow at rest and with distal augmentation maneuvers in the common femoral, femoral and popliteal veins. COMPARISON:  None. FINDINGS: RIGHT LOWER EXTREMITY Common Femoral Vein: No evidence of thrombus. Normal compressibility, respiratory phasicity and response to augmentation. Saphenofemoral Junction: No evidence of  thrombus. Normal compressibility and flow on color Doppler imaging. Profunda Femoral Vein: No evidence of thrombus. Normal compressibility and flow on color Doppler imaging. Femoral Vein: No evidence of thrombus. Normal compressibility, respiratory phasicity and response to augmentation. Popliteal Vein: No evidence of thrombus. Normal compressibility, respiratory phasicity and response to augmentation. Calf Veins: No evidence of thrombus. Normal compressibility and flow on color Doppler imaging. Superficial Great Saphenous Vein: No evidence of thrombus. Normal compressibility. Venous Reflux:  None. Other Findings: No evidence of superficial thrombophlebitis or abnormal fluid collection. LEFT LOWER EXTREMITY Common Femoral Vein: No evidence of thrombus. Normal compressibility, respiratory phasicity and response to augmentation. Saphenofemoral Junction: No evidence of thrombus. Normal compressibility and flow on color Doppler imaging. Profunda Femoral Vein: No evidence of thrombus. Normal compressibility and flow on color Doppler imaging. Femoral Vein: No evidence of thrombus. Normal compressibility, respiratory phasicity and response to augmentation. Popliteal Vein: No evidence of thrombus. Normal compressibility, respiratory phasicity and response to augmentation. Calf Veins: No evidence of thrombus. Normal compressibility and flow on color Doppler imaging. Superficial Great Saphenous Vein: No evidence of thrombus. Normal compressibility. Venous Reflux:  None. Other Findings: No evidence of superficial thrombophlebitis or abnormal fluid collection. IMPRESSION: No evidence of deep venous thrombosis in either lower extremity. Electronically Signed   By: Aletta Edouard M.D.   On: 10/09/2018 17:41   Dg Chest Port 1 View  Result Date: 10/13/2018 CLINICAL DATA:  Symptomatic bilateral pleural effusions. Please perform left-sided ultrasound-guided thoracentesis for diagnostic and therapeutic purposes. EXAM: PORTABLE  CHEST 1 VIEW COMPARISON:  Chest CT - 10/12/2018; chest radiograph - 10/10/2018; ultrasound-guided right-sided thoracentesis - 10/09/2018 FINDINGS: Grossly unchanged enlarged cardiac silhouette and mediastinal contours. Stable position of support apparatus. Interval reduction/resolution of left-sided pleural effusion post thoracentesis. No pneumothorax. Improved aeration of the left lung base. Unchanged small right-sided effusion associated right basilar opacities. No new focal airspace opacities. Pulmonary remains indistinct with cephalization. Unchanged bones. Post lower cervical ACDF, incompletely evaluated. IMPRESSION: 1. Interval reduction/resolution of left-sided pleural effusion post thoracentesis. No pneumothorax. 2. Similar findings of cardiomegaly, pulmonary edema, small right-sided effusion and  right basilar opacities, likely atelectasis. Electronically Signed   By: Sandi Mariscal M.D.   On: 10/13/2018 14:23   Dg Chest Port 1 View  Result Date: 10/09/2018 CLINICAL DATA:  Shortness of breath and chest pain, history of recent thoracentesis EXAM: PORTABLE CHEST 1 VIEW COMPARISON:  Film from earlier in the same day. FINDINGS: Cardiac shadow is again enlarged. Left-sided pleural effusion is again seen and slightly larger than that noted on the prior study. Dialysis catheter and right-sided chest wall port are again seen. No pneumothorax is noted. Mild vascular congestion is seen. IMPRESSION: Slight increase in left-sided pleural effusion. This may be positional in nature. Mild vascular congestion.  No pneumothorax is noted. Electronically Signed   By: Inez Catalina M.D.   On: 10/09/2018 19:45   Dg Chest Port 1 View  Result Date: 10/09/2018 CLINICAL DATA:  Status post right thoracentesis EXAM: PORTABLE CHEST 1 VIEW COMPARISON:  10/08/2018 FINDINGS: Cardiac shadow is stable. Dialysis catheter and right-sided chest wall port are again seen and stable. Postsurgical changes in the cervical spine are noted. Small  left pleural effusion is noted. Right-sided pleural effusion has resolved following thoracentesis. No pneumothorax is noted. IMPRESSION: No pneumothorax following right thoracentesis. Small left pleural effusion. Electronically Signed   By: Inez Catalina M.D.   On: 10/09/2018 16:53   Dg Chest Port 1 View  Result Date: 10/08/2018 CLINICAL DATA:  Shortness of breath. EXAM: PORTABLE CHEST 1 VIEW COMPARISON:  Chest x-ray dated August 28, 2018. FINDINGS: Unchanged right chest wall port catheter and tunneled left internal jugular dialysis catheter. Stable cardiomegaly and pulmonary vascular congestion. Unchanged small bilateral pleural effusions. Slightly increased bibasilar atelectasis. No pneumothorax. No acute osseous abnormality. IMPRESSION: 1. Mild congestive heart failure, similar to prior study. Electronically Signed   By: Titus Dubin M.D.   On: 10/08/2018 08:07   US Thoracentesis Asp Pleural Space W/img Guide  Result Date: 10/13/2018 INDICATION: Symptomatic left-sided pleural effusion. Please from ultrasound-guided thoracentesis for therapeutic and diagnostic purposes. EXAM: US THORACENTESIS ASP PLEURAL SPACE W/IMG GUIDE COMPARISON:  Chest CT-10/12/2018; chest radiograph-10/10/2018; ultrasound-guided right-sided thoracentesis yielding 700 cc of pleural fluid-10/09/2018 MEDICATIONS: None. COMPLICATIONS: None immediate. TECHNIQUE: Informed written consent was obtained from the patient after a discussion of the risks, benefits and alternatives to treatment. A timeout was performed prior to the initiation of the procedure. Initial ultrasound scanning demonstrates a moderate-sized bilateral pleural effusions. Patient requested left-sided ultrasound-guided thoracentesis. As such, the left lower chest was prepped and draped in the usual sterile fashion. 1% lidocaine was used for local anesthesia. An ultrasound image was saved for documentation purposes. An 8 Fr Safe-T-Centesis catheter was introduced. The  thoracentesis was performed. The catheter was removed and a dressing was applied. The patient tolerated the procedure well without immediate post procedural complication. The patient was escorted to have an upright chest radiograph. FINDINGS: A total of approximately 500 liters of serous fluid was removed. Requested samples were sent to the laboratory. IMPRESSION: Successful ultrasound-guided left sided thoracentesis yielding 500 cc of pleural fluid. Electronically Signed   By: Sandi Mariscal M.D.   On: 10/13/2018 14:45   US Thoracentesis Asp Pleural Space W/img Guide  Result Date: 10/09/2018 INDICATION: History of end-stage renal disease, now with symptomatic pleural effusions. Please perform chest ultrasound and ultrasound-guided thoracentesis of the side with greater volume of fluid for therapeutic and diagnostic purposes. EXAM: US THORACENTESIS ASP PLEURAL SPACE W/IMG GUIDE COMPARISON:  Chest radiograph-10/08/2018; CT abdomen pelvis-09/21/2018 MEDICATIONS: None. COMPLICATIONS: None immediate. TECHNIQUE: Informed written consent  was obtained from the patient after a discussion of the risks, benefits and alternatives to treatment. A timeout was performed prior to the initiation of the procedure. Initial ultrasound scanning demonstrates small bilateral pleural effusions, right greater than left. As such the decision was made to proceed with ultrasound-guided right-sided thoracentesis. With the patient positioned left lateral decubitus, the posterolateral aspect of the right chest was prepped and draped in the usual sterile fashion. 1% lidocaine was used for local anesthesia. An ultrasound image was saved for documentation purposes. An 8 Fr Safe-T-Centesis catheter was introduced. The thoracentesis was performed. The catheter was removed and a dressing was applied. The patient tolerated the procedure well without immediate post procedural complication. The patient was escorted to have an upright chest radiograph.  FINDINGS: A total of approximately 700 cc liters of serous fluid was removed. Requested samples were sent to the laboratory. IMPRESSION: Successful ultrasound-guided right sided thoracentesis yielding 700 cc of pleural fluid. Electronically Signed   By: Sandi Mariscal M.D.   On: 10/09/2018 17:01    ONCOLOGY HISTORY: Patient underwent debulking surgery at [facility] on November 19, 2016. She received one infusion preoperative infusion of Keytruda on November 11, 2016 as part of a clinical trial.  She initiated adjuvant carboplatinum and Taxol on December 16, 2016.  This was continued through April 28, 2017 at which time Taxol was discontinued secondary to worsening neuropathy.  Patient then received single agent carboplatinum for additional 2 infusions.  She was then noted to have progression of disease and was switched to Avastin and doxorubicin on August 18, 2017 this was discontinued on November 23, 2017 secondary to declining performance status.  Palliative gemcitabine was initiated on February 20, 2018 which was then discontinued Jul 27, 2018 secondary to declining performance status and end-stage renal disease.  Gemcitabine was reinitiated on September 24, 2018.   ASSESSMENT: Stage IIIc high-grade serous ovarian carcinoma  PLAN:  1. Stage IIIc high-grade serous ovarian carcinoma: See oncology history as above.  Her most recent CA-125 has trended down is now 666.  She expressed understanding that her options are limited particularly now that she has dialysis.  She does not wish to receive treatment with gemcitabine today. Could also attempt Taxol or Taxotere, but there is concern that this would make her peripheral neuropathy worse.  BRCA and germline testing were negative on initial pathology.  We briefly discussed discontinuing chemotherapy and possibly enrolling in hospice.  Patient is not ready to make this decision at this time.  Return to clinic in 1 week for further evaluation and consideration of  additional gemcitabine.  2.  Shortness of breath: Likely multifactorial.  Continue dialysis 3 days a week as planned on Tuesday, Thursday, and Saturday.  Patient also has significant pulmonary hypertension that is likely contributing.  Continue oxygen 24 hours/day as prescribed. 3.  Acute renal failure: Continue dialysis as above. 4.  Pain: Patient does not complain of this today.  Continue oxycodone as needed. 5.  Anemia: Patient's hemoglobin is decreased, but stable at 9.2.  Okay to give Procrit with dialysis.  Continue to monitor closely and will transfuse if it falls below 7.0.  This will likely have to be done in conjunction with dialysis given her current fluid overload. 6.  Thrombocytopenia: Resolved.  I spent a total of 30 minutes face-to-face with the patient of which greater than 50% of the visit was spent in counseling and coordination of care as detailed above.    Lloyd Huger, MD  10/28/2018 9:31 AM

## 2018-10-29 NOTE — Progress Notes (Signed)
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  INTERVAL HISTORY: Patient returns to clinic today for repeat laboratory work and further evaluation.  She continues to have significant weakness and fatigue and decreased performance status.  Her shortness of breath is chronic and unchanged.  She denies any recent fevers or illnesses.  She has no neurologic complaints.  She denies any chest pain, cough, or hemoptysis.  She does not complain of any nausea, vomiting, constipation, or diarrhea today.  She denies any abdominal pain or bloating.  She has no urinary complaints.  Patient offers no further specific complaints today.  REVIEW OF SYSTEMS:   Review of Systems  Constitutional: Positive for malaise/fatigue. Negative for fever and weight loss.  HENT: Negative.  Negative for sore throat.   Eyes: Negative.  Negative for blurred vision.  Respiratory: Positive for shortness of breath. Negative for cough.   Cardiovascular: Negative.  Negative for chest pain and leg swelling.  Gastrointestinal: Positive for nausea. Negative for abdominal pain, blood in stool, constipation, diarrhea, melena and vomiting.  Genitourinary: Negative.  Negative for dysuria.  Musculoskeletal: Negative.  Negative for back pain, joint pain and neck pain.  Skin: Negative.  Negative for itching and rash.    Neurological: Positive for tingling, sensory change and weakness. Negative for focal weakness.  Psychiatric/Behavioral: Negative.  The patient is not nervous/anxious.      As per HPI. Otherwise, a complete review of systems is negative.  PAST MEDICAL HISTORY: Past Medical History:  Diagnosis Date   Allergic rhinitis, cause unspecified    Anxiety state, unspecified    Arthritis    Asthma    only when sick    Backache, unspecified    Bronchitis    hx of when get sick   Cancer [facility])    skin cancer , basal cell    Cancer ([facility]) 11/2016   ovarian   Cervicalgia    CHF (congestive heart failure) (HCC)    Complication of anesthesia    Dermatophytosis of nail    Dysmetabolic syndrome X    Encounter for long-term (current) use of other medications    Esophageal reflux    Hypertension    Insomnia, unspecified    Leukocytosis, unspecified    Migraine without aura, without mention of intractable migraine without mention of status migrainosus    Other and unspecified hyperlipidemia    Other malaise and fatigue    Overweight(278.02)    Personal history of chemotherapy now   ovarian   PONV (postoperative nausea and vomiting)    Renal insufficiency    Spinal stenosis in cervical region    Symptomatic menopausal or female climacteric states    Unspecified disorder of skin and subcutaneous tissue    Unspecified vitamin D deficiency     PAST SURGICAL HISTORY: Past Surgical History:  Procedure Laterality Date   ABDOMINAL HYSTERECTOMY     ANTERIOR CERVICAL DECOMP/DISCECTOMY FUSION N/A 06/07/2015   Procedure: Cervical three - four and Cervical six- seven  anterior cervical decompression with fusion interbody prosthesis plating and bonegraft;  Surgeon: Newman Pies, MD;  Location: [place] [facility];  Service: Neurosurgery;  Laterality: N/A;  C34 and C67 anterior cervical decompression with fusion interbody prosthesis plating and bonegraft   BACK SURGERY     x2  Lower    DIALYSIS/PERMA CATHETER INSERTION N/A 08/17/2018   Procedure: DIALYSIS/PERMA CATHETER INSERTION;  Surgeon: Algernon Huxley, MD;  Location: [place] [facility];  Service: Cardiovascular;  Laterality: N/A;   EVACUATION OF CERVICAL HEMATOMA N/A 06/14/2015   Procedure: EVACUATION OF CERVICAL HEMATOMA;  Surgeon: Newman Pies, MD;  Location: [place] [facility];  Service: Neurosurgery;  Laterality: N/A;   NECK SURGERY     x3   TONSILLECTOMY      FAMILY HISTORY: Family History  Problem Relation Age of Onset   Depression Mother    Migraines Mother    Dementia Father    Diabetes Father    Hyperlipidemia Father    Hyperlipidemia Brother    Hyperlipidemia Brother    Breast cancer Paternal Aunt        21s    ADVANCED DIRECTIVES (Y/N):  N  HEALTH MAINTENANCE: Social History   Tobacco Use   Smoking status: Former Smoker    Packs/day: 1.50    Years: 20.00    Pack years: 30.00    Types: Cigarettes    Start date: 03/19/1979    Quit date: 08/25/1999    Years since quitting: 19.2   Smokeless tobacco: Never Used   Tobacco comment: smoking cessation materials not required  Substance Use Topics   Alcohol use: Not Currently    Alcohol/week: 0.0 standard drinks   Drug use: No     Colonoscopy:  PAP:  Bone density:  Lipid panel:  No Known Allergies  Current Outpatient Medications  Medication Sig Dispense Refill   albuterol (PROVENTIL HFA;VENTOLIN HFA) 108 (90 Base) MCG/ACT inhaler Inhale 2 puffs into the lungs every 6 (six) hours as needed for wheezing or shortness of breath. 1 Inhaler 2   albuterol (PROVENTIL) (2.5 MG/3ML) 0.083% nebulizer solution Take 3 mLs (2.5 mg total) by nebulization every 6 (six) hours as needed for wheezing or shortness of breath. 75 mL 2   ALPRAZolam (XANAX) 0.25 MG tablet Take 1 tablet (0.25 mg total) by mouth at bedtime as needed for anxiety. 30 tablet 0   amiodarone (PACERONE) 200 MG tablet Take 1 tablet (200 mg total) by mouth daily. 90  tablet 3   apixaban (ELIQUIS) 5 MG TABS tablet Take 1 tablet (5 mg total) by mouth 2 (two) times daily. 60 tablet 0   cetirizine (ZYRTEC) 10 MG tablet Take 10 mg by mouth daily.     Cholecalciferol (VITAMIN D-1000 MAX ST) 1000 UNITS tablet Take 1,000 Units by mouth 2 (two) times daily.      cyclobenzaprine (FLEXERIL) 10 MG tablet Take 1 tablet (10 mg total) by mouth every 8 (eight) hours as needed for muscle spasms. 30 tablet 1   DULoxetine (CYMBALTA) 60 MG capsule Take 1 capsule (60 mg total) by mouth daily. 90 capsule 0   ferrous sulfate (IRON SUPPLEMENT) 325 (65 FE) MG tablet Take 1 tablet by mouth daily.     hydrALAZINE (APRESOLINE) 25 MG tablet Take 1 tablet (25 mg total) by mouth 3 (three) times daily. For BP if above 150/90 90 tablet 0   ipratropium (ATROVENT HFA) 17 MCG/ACT inhaler Inhale 2 puffs into the lungs every 6 (six) hours as needed for wheezing.  isosorbide mononitrate (IMDUR) 30 MG 24 hr tablet Take 1 tablet (30 mg total) by mouth daily. 30 tablet 2   levothyroxine (SYNTHROID) 25 MCG tablet Take 1 tablet (25 mcg total) by mouth daily before breakfast. 30 tablet 2   lidocaine (XYLOCAINE) 2 % solution Use as directed 15 mLs in the mouth or throat as needed for mouth pain. 100 mL 0   losartan (COZAAR) 100 MG tablet Take 1 tablet (100 mg total) by mouth daily. 30 tablet 0   Lysine 1000 MG TABS Take 1,000 mg by mouth daily.      Magnesium Oxide 400 (240 Mg) MG TABS Take 400 mg by mouth 2 (two) times daily.      metoCLOPramide (REGLAN) 5 MG tablet Take 1 tablet (5 mg total) by mouth every 8 (eight) hours as needed for nausea or vomiting. 90 tablet 1   metoprolol succinate (TOPROL-XL) 50 MG 24 hr tablet Take 1 tablet (50 mg total) by mouth daily. Take with or immediately following a meal. 90 tablet 3   mometasone-formoterol (DULERA) 200-5 MCG/ACT AERO Inhale 2 puffs into the lungs 2 (two) times daily.     Multiple Vitamins-Minerals (MULTIVITAMIN PO) Take 1 tablet by  mouth daily.      Omega-3 Fatty Acids (FISH OIL) 1000 MG CAPS Take 1,000 capsules by mouth 2 (two) times daily.      ondansetron (ZOFRAN-ODT) 8 MG disintegrating tablet Take 1 tablet (8 mg total) by mouth every 8 (eight) hours as needed for nausea or vomiting. 60 tablet 3   polyethylene glycol powder (GLYCOLAX/MIRALAX) 17 GM/SCOOP powder Take 17 g by mouth daily as needed for mild constipation or moderate constipation.      SUMAtriptan (IMITREX) 100 MG tablet May repeat in 2 hours if headache persists or recurs. (Patient taking differently: Take 100 mg by mouth daily as needed for migraine. May repeat in 2 hours if headache persists or recurs.) 9 tablet 1   temazepam (RESTORIL) 30 MG capsule Take 1 capsule (30 mg total) by mouth at bedtime as needed for sleep. 30 capsule 0   torsemide (DEMADEX) 20 MG tablet Take 2 tablets (40 mg total) by mouth 2 (two) times daily. 120 tablet 0   vitamin B-12 (CYANOCOBALAMIN) 1000 MCG tablet Take 1,000 mcg by mouth daily.     vitamin C (ASCORBIC ACID) 500 MG tablet Take 500 mg by mouth daily.     fluticasone (FLONASE) 50 MCG/ACT nasal spray Place 2 sprays into both nostrils as needed. 48 g 1   oxyCODONE-acetaminophen (PERCOCET) 10-325 MG tablet Take 1 tablet by mouth every 4 (four) hours as needed for pain. 60 tablet 0   senna (SENOKOT) 8.6 MG TABS tablet Take 1 tablet (8.6 mg total) by mouth daily. 120 tablet 0   No current facility-administered medications for this visit.    Facility-Administered Medications Ordered in Other Visits  Medication Dose Route Frequency Provider Last Rate Last Dose   0.9 %  sodium chloride infusion   Intravenous Once Grayland Ormond, Kathlene November, MD       0.9 %  sodium chloride infusion   Intravenous Once Lloyd Huger, MD       heparin lock flush 100 unit/mL  500 Units Intravenous Once Lloyd Huger, MD       sodium chloride flush (NS) 0.9 % injection 10 mL  10 mL Intravenous PRN Lloyd Huger, MD   10 mL at  06/16/17 0854   sodium chloride flush (NS) 0.9 % injection 10  mL  10 mL Intravenous Once Lloyd Huger, MD        OBJECTIVE: Vitals:   11/03/18 1308  BP: (!) 159/77  Pulse: 64  Temp: (!) 97.5 F (36.4 C)     Body mass index is 27.07 kg/m.    ECOG FS:2 - Symptomatic, <50% confined to bed  General: Ill-appearing, no acute distress. Eyes: Pink conjunctiva, anicteric sclera. HEENT: Normocephalic, moist mucous membranes. Lungs: Clear to auscultation bilaterally. Heart: Regular rate and rhythm. No rubs, murmurs, or gallops. Abdomen: Nontender, nondistended. Musculoskeletal: No edema, cyanosis, or clubbing. Neuro: Alert, answering all questions appropriately. Cranial nerves grossly intact. Skin: No rashes or petechiae noted. Psych: Normal affect.  LAB RESULTS:  Lab Results  Component Value Date   NA 137 11/03/2018   K 4.1 11/03/2018   CL 97 (L) 11/03/2018   CO2 30 11/03/2018   GLUCOSE 111 (H) 11/03/2018   BUN 15 11/03/2018   CREATININE 2.16 (H) 11/03/2018   CALCIUM 8.2 (L) 11/03/2018   PROT 7.0 11/03/2018   ALBUMIN 3.0 (L) 11/03/2018   AST 17 11/03/2018   ALT 10 11/03/2018   ALKPHOS 71 11/03/2018   BILITOT 0.5 11/03/2018   GFRNONAA 24 (L) 11/03/2018   GFRAA 27 (L) 11/03/2018    Lab Results  Component Value Date   WBC 6.5 11/03/2018   NEUTROABS 3.7 11/03/2018   HGB 10.0 (L) 11/03/2018   HCT 31.7 (L) 11/03/2018   MCV 99.4 11/03/2018   PLT 253 11/03/2018     STUDIES: Dg Chest 2 View  Result Date: 10/10/2018 CLINICAL DATA:  Ovarian cancer on chemotherapy, hypertension, CHF, asthma, end-stage renal disease on dialysis, former smoker EXAM: CHEST - 2 VIEW COMPARISON:  10/09/2018 FINDINGS: RIGHT jugular Port-A-Cath with tip projecting over SVC. LEFT jugular dual-lumen central venous catheter with tip above cavoatrial junction. Enlargement of cardiac silhouette with pulmonary vascular congestion. Stable mediastinal contours. Bibasilar effusions and mild LEFT  basilar atelectasis. Minimal perihilar edema. No pneumothorax or acute osseous findings. IMPRESSION: Enlargement of cardiac silhouette with pulmonary vascular congestion and mild pulmonary edema. Bibasilar effusions and LEFT LEFT basilar atelectasis. Electronically Signed   By: Lavonia Dana M.D.   On: 10/10/2018 17:28   Ct Chest Wo Contrast  Result Date: 10/12/2018 CLINICAL DATA:  Pleural effusion. Clinical sepsis and neutropenic fever. Status post thoracentesis. EXAM: CT CHEST WITHOUT CONTRAST TECHNIQUE: Multidetector CT imaging of the chest was performed following the standard protocol without IV contrast. COMPARISON:  Chest CT 08/09/2018 FINDINGS: Cardiovascular: The heart is enlarged but stable. No pericardial effusion. Stable tortuosity, ectasia and calcification of the thoracic aorta. Scattered coronary artery calcifications. Right-sided Port-A-Cath and left IJ dialysis catheter in good positions without complicating features. Mediastinum/Nodes: Stable scattered mediastinal and hilar lymph nodes without mass or overt adenopathy. The esophagus is grossly normal. Lungs/Pleura: Moderate-sized bilateral pleural effusions with overlying atelectasis. Patchy inflammatory changes in both lungs with areas of tree-in-bud appearance suggesting chronic inflammation or atypical infection such as MAC or viral pneumonitis. No worrisome pulmonary lesions. No endobronchial lesions are identified. Upper Abdomen: No significant upper abdominal findings. Musculoskeletal: No significant bony findings. No breast masses, supraclavicular or axillary adenopathy. IMPRESSION: 1. Moderate-sized bilateral pleural effusions with overlying atelectasis. 2. Patchy inflammatory or atypical infectious process in the lungs. No focal pneumonia. No worrisome pulmonary lesions. 3. Stable cardiac enlargement and tortuosity and calcification of the thoracic aorta. 4. Stable mediastinal and hilar lymph nodes without mass or overt adenopathy. Aortic  Atherosclerosis (ICD10-I70.0). Electronically Signed   By: Marijo Sanes  M.D.   On: 10/12/2018 12:26   US Abdomen Limited  Result Date: 10/09/2018 CLINICAL DATA:  History of end-stage renal disease, now with bilateral pleural effusions and intra-abdominal ascites. Please perform ascites search ultrasound and ultrasound-guided paracentesis as indicated. EXAM: LIMITED ABDOMEN ULTRASOUND FOR ASCITES TECHNIQUE: Limited ultrasound survey for ascites was performed in all four abdominal quadrants. COMPARISON:  CT abdomen pelvis-09/21/2018 FINDINGS: Sonographic evaluation demonstrates a trace amount of intra-abdominal ascites, too small to allow for safe ultrasound-guided paracentesis. Note is made of small bilateral pleural effusions, right greater than left. IMPRESSION: 1. Trace amount of intra-abdominal ascites, too small to allow for safe ultrasound-guided paracentesis. 2. Note is made of small bilateral pleural effusions, right greater than left. Patient subsequently underwent ultrasound-guided right-sided thoracentesis. Electronically Signed   By: Sandi Mariscal M.D.   On: 10/09/2018 16:59   US Venous Img Lower Bilateral  Result Date: 10/09/2018 CLINICAL DATA:  Bilateral lower extremity pain and edema. EXAM: BILATERAL LOWER EXTREMITY VENOUS DOPPLER ULTRASOUND TECHNIQUE: Gray-scale sonography with graded compression, as well as color Doppler and duplex ultrasound were performed to evaluate the lower extremity deep venous systems from the level of the common femoral vein and including the common femoral, femoral, profunda femoral, popliteal and calf veins including the posterior tibial, peroneal and gastrocnemius veins when visible. The superficial great saphenous vein was also interrogated. Spectral Doppler was utilized to evaluate flow at rest and with distal augmentation maneuvers in the common femoral, femoral and popliteal veins. COMPARISON:  None. FINDINGS: RIGHT LOWER EXTREMITY Common Femoral Vein: No  evidence of thrombus. Normal compressibility, respiratory phasicity and response to augmentation. Saphenofemoral Junction: No evidence of thrombus. Normal compressibility and flow on color Doppler imaging. Profunda Femoral Vein: No evidence of thrombus. Normal compressibility and flow on color Doppler imaging. Femoral Vein: No evidence of thrombus. Normal compressibility, respiratory phasicity and response to augmentation. Popliteal Vein: No evidence of thrombus. Normal compressibility, respiratory phasicity and response to augmentation. Calf Veins: No evidence of thrombus. Normal compressibility and flow on color Doppler imaging. Superficial Great Saphenous Vein: No evidence of thrombus. Normal compressibility. Venous Reflux:  None. Other Findings: No evidence of superficial thrombophlebitis or abnormal fluid collection. LEFT LOWER EXTREMITY Common Femoral Vein: No evidence of thrombus. Normal compressibility, respiratory phasicity and response to augmentation. Saphenofemoral Junction: No evidence of thrombus. Normal compressibility and flow on color Doppler imaging. Profunda Femoral Vein: No evidence of thrombus. Normal compressibility and flow on color Doppler imaging. Femoral Vein: No evidence of thrombus. Normal compressibility, respiratory phasicity and response to augmentation. Popliteal Vein: No evidence of thrombus. Normal compressibility, respiratory phasicity and response to augmentation. Calf Veins: No evidence of thrombus. Normal compressibility and flow on color Doppler imaging. Superficial Great Saphenous Vein: No evidence of thrombus. Normal compressibility. Venous Reflux:  None. Other Findings: No evidence of superficial thrombophlebitis or abnormal fluid collection. IMPRESSION: No evidence of deep venous thrombosis in either lower extremity. Electronically Signed   By: Aletta Edouard M.D.   On: 10/09/2018 17:41   Dg Chest Port 1 View  Result Date: 10/13/2018 CLINICAL DATA:  Symptomatic  bilateral pleural effusions. Please perform left-sided ultrasound-guided thoracentesis for diagnostic and therapeutic purposes. EXAM: PORTABLE CHEST 1 VIEW COMPARISON:  Chest CT - 10/12/2018; chest radiograph - 10/10/2018; ultrasound-guided right-sided thoracentesis - 10/09/2018 FINDINGS: Grossly unchanged enlarged cardiac silhouette and mediastinal contours. Stable position of support apparatus. Interval reduction/resolution of left-sided pleural effusion post thoracentesis. No pneumothorax. Improved aeration of the left lung base. Unchanged small right-sided effusion associated right basilar opacities.  No new focal airspace opacities. Pulmonary remains indistinct with cephalization. Unchanged bones. Post lower cervical ACDF, incompletely evaluated. IMPRESSION: 1. Interval reduction/resolution of left-sided pleural effusion post thoracentesis. No pneumothorax. 2. Similar findings of cardiomegaly, pulmonary edema, small right-sided effusion and right basilar opacities, likely atelectasis. Electronically Signed   By: Sandi Mariscal M.D.   On: 10/13/2018 14:23   Dg Chest Port 1 View  Result Date: 10/09/2018 CLINICAL DATA:  Shortness of breath and chest pain, history of recent thoracentesis EXAM: PORTABLE CHEST 1 VIEW COMPARISON:  Film from earlier in the same day. FINDINGS: Cardiac shadow is again enlarged. Left-sided pleural effusion is again seen and slightly larger than that noted on the prior study. Dialysis catheter and right-sided chest wall port are again seen. No pneumothorax is noted. Mild vascular congestion is seen. IMPRESSION: Slight increase in left-sided pleural effusion. This may be positional in nature. Mild vascular congestion.  No pneumothorax is noted. Electronically Signed   By: Inez Catalina M.D.   On: 10/09/2018 19:45   Dg Chest Port 1 View  Result Date: 10/09/2018 CLINICAL DATA:  Status post right thoracentesis EXAM: PORTABLE CHEST 1 VIEW COMPARISON:  10/08/2018 FINDINGS: Cardiac shadow is  stable. Dialysis catheter and right-sided chest wall port are again seen and stable. Postsurgical changes in the cervical spine are noted. Small left pleural effusion is noted. Right-sided pleural effusion has resolved following thoracentesis. No pneumothorax is noted. IMPRESSION: No pneumothorax following right thoracentesis. Small left pleural effusion. Electronically Signed   By: Inez Catalina M.D.   On: 10/09/2018 16:53   Dg Chest Port 1 View  Result Date: 10/08/2018 CLINICAL DATA:  Shortness of breath. EXAM: PORTABLE CHEST 1 VIEW COMPARISON:  Chest x-ray dated August 28, 2018. FINDINGS: Unchanged right chest wall port catheter and tunneled left internal jugular dialysis catheter. Stable cardiomegaly and pulmonary vascular congestion. Unchanged small bilateral pleural effusions. Slightly increased bibasilar atelectasis. No pneumothorax. No acute osseous abnormality. IMPRESSION: 1. Mild congestive heart failure, similar to prior study. Electronically Signed   By: Titus Dubin M.D.   On: 10/08/2018 08:07   US Thoracentesis Asp Pleural Space W/img Guide  Result Date: 10/13/2018 INDICATION: Symptomatic left-sided pleural effusion. Please from ultrasound-guided thoracentesis for therapeutic and diagnostic purposes. EXAM: US THORACENTESIS ASP PLEURAL SPACE W/IMG GUIDE COMPARISON:  Chest CT-10/12/2018; chest radiograph-10/10/2018; ultrasound-guided right-sided thoracentesis yielding 700 cc of pleural fluid-10/09/2018 MEDICATIONS: None. COMPLICATIONS: None immediate. TECHNIQUE: Informed written consent was obtained from the patient after a discussion of the risks, benefits and alternatives to treatment. A timeout was performed prior to the initiation of the procedure. Initial ultrasound scanning demonstrates a moderate-sized bilateral pleural effusions. Patient requested left-sided ultrasound-guided thoracentesis. As such, the left lower chest was prepped and draped in the usual sterile fashion. 1% lidocaine was  used for local anesthesia. An ultrasound image was saved for documentation purposes. An 8 Fr Safe-T-Centesis catheter was introduced. The thoracentesis was performed. The catheter was removed and a dressing was applied. The patient tolerated the procedure well without immediate post procedural complication. The patient was escorted to have an upright chest radiograph. FINDINGS: A total of approximately 500 liters of serous fluid was removed. Requested samples were sent to the laboratory. IMPRESSION: Successful ultrasound-guided left sided thoracentesis yielding 500 cc of pleural fluid. Electronically Signed   By: Sandi Mariscal M.D.   On: 10/13/2018 14:45   US Thoracentesis Asp Pleural Space W/img Guide  Result Date: 10/09/2018 INDICATION: History of end-stage renal disease, now with symptomatic pleural effusions. Please perform chest  ultrasound and ultrasound-guided thoracentesis of the side with greater volume of fluid for therapeutic and diagnostic purposes. EXAM: US THORACENTESIS ASP PLEURAL SPACE W/IMG GUIDE COMPARISON:  Chest radiograph-10/08/2018; CT abdomen pelvis-09/21/2018 MEDICATIONS: None. COMPLICATIONS: None immediate. TECHNIQUE: Informed written consent was obtained from the patient after a discussion of the risks, benefits and alternatives to treatment. A timeout was performed prior to the initiation of the procedure. Initial ultrasound scanning demonstrates small bilateral pleural effusions, right greater than left. As such the decision was made to proceed with ultrasound-guided right-sided thoracentesis. With the patient positioned left lateral decubitus, the posterolateral aspect of the right chest was prepped and draped in the usual sterile fashion. 1% lidocaine was used for local anesthesia. An ultrasound image was saved for documentation purposes. An 8 Fr Safe-T-Centesis catheter was introduced. The thoracentesis was performed. The catheter was removed and a dressing was applied. The patient  tolerated the procedure well without immediate post procedural complication. The patient was escorted to have an upright chest radiograph. FINDINGS: A total of approximately 700 cc liters of serous fluid was removed. Requested samples were sent to the laboratory. IMPRESSION: Successful ultrasound-guided right sided thoracentesis yielding 700 cc of pleural fluid. Electronically Signed   By: Sandi Mariscal M.D.   On: 10/09/2018 17:01    ONCOLOGY HISTORY: Patient underwent debulking surgery at [facility] on November 19, 2016. She received one infusion preoperative infusion of Keytruda on November 11, 2016 as part of a clinical trial.  She initiated adjuvant carboplatinum and Taxol on December 16, 2016.  This was continued through April 28, 2017 at which time Taxol was discontinued secondary to worsening neuropathy.  Patient then received single agent carboplatinum for additional 2 infusions.  She was then noted to have progression of disease and was switched to Avastin and doxorubicin on August 18, 2017 this was discontinued on November 24, 2017 secondary to declining performance status.  Palliative gemcitabine was initiated on February 20, 2018 which was then discontinued Jul 27, 2018 secondary to declining performance status and end-stage renal disease.  Gemcitabine was reinitiated on September 24, 2018.   ASSESSMENT: Stage IIIc high-grade serous ovarian carcinoma  PLAN:  1. Stage IIIc high-grade serous ovarian carcinoma: See oncology history as above.  Her most recent CA-125 has trended down is now 666, today's result is pending.  She expressed understanding that her options are limited particularly now that she has dialysis.  She once again has declined gemcitabine today. Could also attempt Taxol or Taxotere, but there is concern that this would make her peripheral neuropathy worse.  BRCA and germline testing were negative on initial pathology.  Hospice and end-of-life care were once again discussed.  Appreciate  palliative care input.  Will get repeat imaging over the next week to assess the status of her malignancy.  Patient will then return to clinic in 1 week to further discuss whether to discontinue treatments and enroll in hospice or not.   2.  Shortness of breath: Likely multifactorial.  Continue dialysis 3 days a week as planned on Tuesday, Thursday, and Saturday.  Patient also has significant pulmonary hypertension that is likely contributing.  Continue oxygen 24 hours/day as prescribed.  Appreciate cardiology input. 3.  Acute renal failure: Continue dialysis as above. 4.  Pain: Patient does not complain of this today.  Continue oxycodone as needed. 5.  Anemia: Hemoglobin is decreased, but stable at 10.0.  Okay to give Procrit with dialysis.  Continue to monitor closely and will transfuse if it falls below  7.0.  This will likely have to be done in conjunction with dialysis given her current fluid overload. 6.  Thrombocytopenia: Resolved.  I spent a total of 30 minutes face-to-face with the patient of which greater than 50% of the visit was spent in counseling and coordination of care as detailed above.     Lloyd Huger, MD   11/03/2018 3:04 PM

## 2018-10-30 ENCOUNTER — Other Ambulatory Visit: Payer: Self-pay | Admitting: Oncology

## 2018-11-03 ENCOUNTER — Other Ambulatory Visit: Payer: Self-pay

## 2018-11-03 ENCOUNTER — Inpatient Hospital Stay: Payer: PPO

## 2018-11-03 ENCOUNTER — Inpatient Hospital Stay (HOSPITAL_BASED_OUTPATIENT_CLINIC_OR_DEPARTMENT_OTHER): Payer: PPO | Admitting: Oncology

## 2018-11-03 ENCOUNTER — Inpatient Hospital Stay (HOSPITAL_BASED_OUTPATIENT_CLINIC_OR_DEPARTMENT_OTHER): Payer: PPO | Admitting: Hospice and Palliative Medicine

## 2018-11-03 ENCOUNTER — Encounter: Payer: Self-pay | Admitting: Oncology

## 2018-11-03 VITALS — BP 159/77 | HR 64 | Temp 97.5°F | Wt 148.0 lb

## 2018-11-03 DIAGNOSIS — C569 Malignant neoplasm of unspecified ovary: Secondary | ICD-10-CM | POA: Diagnosis not present

## 2018-11-03 DIAGNOSIS — Z515 Encounter for palliative care: Secondary | ICD-10-CM | POA: Diagnosis not present

## 2018-11-03 DIAGNOSIS — M5441 Lumbago with sciatica, right side: Secondary | ICD-10-CM

## 2018-11-03 DIAGNOSIS — G8929 Other chronic pain: Secondary | ICD-10-CM

## 2018-11-03 DIAGNOSIS — M542 Cervicalgia: Secondary | ICD-10-CM | POA: Diagnosis not present

## 2018-11-03 LAB — CBC WITH DIFFERENTIAL/PLATELET
Abs Immature Granulocytes: 0.09 10*3/uL — ABNORMAL HIGH (ref 0.00–0.07)
Basophils Absolute: 0.1 10*3/uL (ref 0.0–0.1)
Basophils Relative: 1 %
Eosinophils Absolute: 0.7 10*3/uL — ABNORMAL HIGH (ref 0.0–0.5)
Eosinophils Relative: 11 %
HCT: 31.7 % — ABNORMAL LOW (ref 36.0–46.0)
Hemoglobin: 10 g/dL — ABNORMAL LOW (ref 12.0–15.0)
Immature Granulocytes: 1 %
Lymphocytes Relative: 15 %
Lymphs Abs: 1 10*3/uL (ref 0.7–4.0)
MCH: 31.3 pg (ref 26.0–34.0)
MCHC: 31.5 g/dL (ref 30.0–36.0)
MCV: 99.4 fL (ref 80.0–100.0)
Monocytes Absolute: 1 10*3/uL (ref 0.1–1.0)
Monocytes Relative: 15 %
Neutro Abs: 3.7 10*3/uL (ref 1.7–7.7)
Neutrophils Relative %: 57 %
Platelets: 253 10*3/uL (ref 150–400)
RBC: 3.19 MIL/uL — ABNORMAL LOW (ref 3.87–5.11)
RDW: 16.1 % — ABNORMAL HIGH (ref 11.5–15.5)
WBC: 6.5 10*3/uL (ref 4.0–10.5)
nRBC: 0 % (ref 0.0–0.2)

## 2018-11-03 LAB — COMPREHENSIVE METABOLIC PANEL
ALT: 10 U/L (ref 0–44)
AST: 17 U/L (ref 15–41)
Albumin: 3 g/dL — ABNORMAL LOW (ref 3.5–5.0)
Alkaline Phosphatase: 71 U/L (ref 38–126)
Anion gap: 10 (ref 5–15)
BUN: 15 mg/dL (ref 8–23)
CO2: 30 mmol/L (ref 22–32)
Calcium: 8.2 mg/dL — ABNORMAL LOW (ref 8.9–10.3)
Chloride: 97 mmol/L — ABNORMAL LOW (ref 98–111)
Creatinine, Ser: 2.16 mg/dL — ABNORMAL HIGH (ref 0.44–1.00)
GFR calc Af Amer: 27 mL/min — ABNORMAL LOW (ref >60)
GFR calc non Af Amer: 24 mL/min — ABNORMAL LOW (ref >60)
Glucose, Bld: 111 mg/dL — ABNORMAL HIGH (ref 70–99)
Potassium: 4.1 mmol/L (ref 3.5–5.1)
Sodium: 137 mmol/L (ref 135–145)
Total Bilirubin: 0.5 mg/dL (ref 0.3–1.2)
Total Protein: 7 g/dL (ref 6.5–8.1)

## 2018-11-03 LAB — SAMPLE TO BLOOD BANK

## 2018-11-03 MED ORDER — SENNA 8.6 MG PO TABS: 1.0000 | ORAL_TABLET | Freq: Every day | ORAL | 0 refills | Status: DC

## 2018-11-03 MED ORDER — SODIUM CHLORIDE 0.9% FLUSH
10.0000 mL | Freq: Once | INTRAVENOUS | Status: AC
  Filled 2018-11-03: qty 10

## 2018-11-03 MED ORDER — OXYCODONE-ACETAMINOPHEN 10-325 MG PO TABS: 1.0000 | ORAL_TABLET | ORAL | 0 refills | Status: DC | PRN

## 2018-11-03 MED ORDER — HEPARIN SOD (PORK) LOCK FLUSH 100 UNIT/ML IV SOLN: 500.0000 [IU] | Freq: Once | INTRAVENOUS | Status: AC

## 2018-11-03 NOTE — Progress Notes (Signed)
[name]  Telephone:([phone] [phone]   Name: Jessica Burgess Date: 11/03/2018 MRN: [id number]  DOB: 02-01-1956  Patient Care Team: Patient, No Pcp Per as PCP - General ([place]) End, Harrell Gave, MD as PCP - Cardiology (Cardiology) Lloyd Huger, MD as Consulting Physician (Oncology) Mellody Drown, MD as Consulting Physician (Obstetrics and Gynecology) Cathi Roan, [facility] (Pharmacist) Benedetto Goad, RN as Case Manager Clent Jacks, RN as Registered Nurse    REASON FOR CONSULTATION: Palliative Care consult requested for this 63 y.o. female with multiple medical problems including stage IIIc high-grade serous ovarian carcinoma.  She is status post debulking surgery at [facility] in 2018 and received one preoperative dose of Keytruda as part of a clinical trial.  Patient completed adjuvant carboplatinum and Taxol, which was discontinued secondary to worsening neuropathy.  Patient was also tried on Avastin and doxorubicin due to disease progression but was ultimately switched to gemcitabine secondary to declining performance status.  She has been on treatment with gemcitabine since December 2019.    PMH is also notable for ESRD recently started on dialysis and severe diastolic dysfunction with recurrent CHF and severe pulmonary HTN.  Patient was recently hospitalized 08/28/2018 to 08/30/2018 with CHF.  Patient was referred to palliative care to help address symptoms and goals.    SOCIAL HISTORY:     reports that she quit smoking about 19 years ago. Her smoking use included cigarettes. She started smoking about 39 years ago. She has a 30.00 pack-year smoking history. She has never used smokeless tobacco. She reports previous alcohol use. She reports that she does not use drugs.   Patient is unmarried. Has no children. She lives at home with her sister. She also has two brothers. Patient formerly worked at the  [employer] and then [employer] improvement.  ADVANCE DIRECTIVES:  Sister is HCPOA  CODE STATUS: DNR (MOST form completed 11/03/18)  PAST MEDICAL HISTORY: Past Medical History:  Diagnosis Date   Allergic rhinitis, cause unspecified    Anxiety state, unspecified    Arthritis    Asthma    only when sick    Backache, unspecified    Bronchitis    hx of when get sick   Cancer [facility])    skin cancer , basal cell    Cancer ([place]) 11/2016   ovarian   Cervicalgia    CHF (congestive heart failure) (HCC)    Complication of anesthesia    Dermatophytosis of nail    Dysmetabolic syndrome X    Encounter for long-term (current) use of other medications    Esophageal reflux    Hypertension    Insomnia, unspecified    Leukocytosis, unspecified    Migraine without aura, without mention of intractable migraine without mention of status migrainosus    Other and unspecified hyperlipidemia    Other malaise and fatigue    Overweight(278.02)    Personal history of chemotherapy now   ovarian   PONV (postoperative nausea and vomiting)    Renal insufficiency    Spinal stenosis in cervical region    Symptomatic menopausal or female climacteric states    Unspecified disorder of skin and subcutaneous tissue    Unspecified vitamin D deficiency     PAST SURGICAL HISTORY:  Past Surgical History:  Procedure Laterality Date   ABDOMINAL HYSTERECTOMY     ANTERIOR CERVICAL DECOMP/DISCECTOMY FUSION N/A 06/07/2015   Procedure: Cervical three - four and Cervical six- seven anterior  cervical decompression with fusion interbody prosthesis plating and bonegraft;  Surgeon: Newman Pies, MD;  Location: [place] [facility]UR[facility] ORS;  Service: Neurosurgery;  Laterality: N/A;  C34 and C67 anterior cervical decompression with fusion interbody prosthesis plating and bonegraft   BACK SURGERY     x2 Lower    DIALYSIS/PERMA CATHETER INSERTION N/A 08/17/2018   Procedure: DIALYSIS/PERMA CATHETER  INSERTION;  Surgeon: Algernon Huxley, MD;  Location: [place] CV LAB;  Service: Cardiovascular;  Laterality: N/A;   EVACUATION OF CERVICAL HEMATOMA N/A 06/14/2015   Procedure: EVACUATION OF CERVICAL HEMATOMA;  Surgeon: Newman Pies, MD;  Location: [place] NEURO ORS;  Service: Neurosurgery;  Laterality: N/A;   NECK SURGERY     x3   TONSILLECTOMY      HEMATOLOGY/ONCOLOGY HISTORY:  Oncology History  Ovarian cancer, unspecified laterality ([place])  11/12/2016 Initial Diagnosis   Ovarian cancer, unspecified laterality ([place])   08/08/2017 - 12/07/2017 Chemotherapy   The patient had bevacizumab (AVASTIN) 700 mg in sodium chloride 0.9 % 100 mL chemo infusion, 725 mg, Intravenous,  Once, 4 of 6 cycles Administration: 700 mg (08/18/2017), 700 mg (09/01/2017), 700 mg (09/15/2017), 700 mg (09/29/2017), 700 mg (10/13/2017), 700 mg (10/27/2017), 700 mg (11/10/2017), 700 mg (11/24/2017) DOXOrubicin HCL LIPOSOMAL (DOXIL) 70 mg in dextrose 5 % 250 mL chemo infusion, 72 mg, Intravenous,  Once, 4 of 6 cycles Administration: 70 mg (08/18/2017), 70 mg (09/15/2017), 70 mg (10/13/2017), 70 mg (11/10/2017)  for chemotherapy treatment.    02/19/2018 -  Chemotherapy   The patient had gemcitabine (GEMZAR) 1,800 mg in sodium chloride 0.9 % 250 mL chemo infusion, 1,748 mg, Intravenous,  Once, 7 of 9 cycles Dose modification: 900 mg/m2 (original dose 1,000 mg/m2, Cycle 1, Reason: Other (see comments)) Administration: 1,800 mg (02/19/2018), 1,800 mg (02/26/2018), 1,600 mg (03/05/2018), 1,600 mg (03/23/2018), 1,600 mg (03/30/2018), 1,600 mg (04/06/2018), 1,600 mg (04/20/2018), 1,600 mg (04/27/2018), 1,600 mg (05/25/2018), 1,600 mg (06/01/2018), 1,600 mg (06/08/2018), 1,600 mg (06/22/2018), 1,600 mg (06/29/2018), 1,600 mg (07/13/2018), 1,600 mg (07/27/2018), 1,600 mg (09/24/2018), 1,600 mg (10/06/2018)  for chemotherapy treatment.      ALLERGIES:  has No Known Allergies.  MEDICATIONS:  Current Outpatient Medications  Medication Sig Dispense Refill    albuterol (PROVENTIL HFA;VENTOLIN HFA) 108 (90 Base) MCG/ACT inhaler Inhale 2 puffs into the lungs every 6 (six) hours as needed for wheezing or shortness of breath. 1 Inhaler 2   albuterol (PROVENTIL) (2.5 MG/3ML) 0.083% nebulizer solution Take 3 mLs (2.5 mg total) by nebulization every 6 (six) hours as needed for wheezing or shortness of breath. 75 mL 2   ALPRAZolam (XANAX) 0.25 MG tablet Take 1 tablet (0.25 mg total) by mouth at bedtime as needed for anxiety. 30 tablet 0   amiodarone (PACERONE) 200 MG tablet Take 1 tablet (200 mg total) by mouth daily. 90 tablet 3   apixaban (ELIQUIS) 5 MG TABS tablet Take 1 tablet (5 mg total) by mouth 2 (two) times daily. 60 tablet 0   cetirizine (ZYRTEC) 10 MG tablet Take 10 mg by mouth daily.     Cholecalciferol (VITAMIN D-1000 MAX ST) 1000 UNITS tablet Take 1,000 Units by mouth 2 (two) times daily.      cyclobenzaprine (FLEXERIL) 10 MG tablet Take 1 tablet (10 mg total) by mouth every 8 (eight) hours as needed for muscle spasms. 30 tablet 1   DULoxetine (CYMBALTA) 60 MG capsule Take 1 capsule (60 mg total) by mouth daily. 90 capsule 0   ferrous sulfate (IRON SUPPLEMENT) 325 (65 FE) MG  tablet Take 1 tablet by mouth daily.     fluticasone (FLONASE) 50 MCG/ACT nasal spray Place 2 sprays into both nostrils as needed. 48 g 1   hydrALAZINE (APRESOLINE) 25 MG tablet Take 1 tablet (25 mg total) by mouth 3 (three) times daily. For BP if above 150/90 90 tablet 0   ipratropium (ATROVENT HFA) 17 MCG/ACT inhaler Inhale 2 puffs into the lungs every 6 (six) hours as needed for wheezing.      isosorbide mononitrate (IMDUR) 30 MG 24 hr tablet Take 1 tablet (30 mg total) by mouth daily. 30 tablet 2   levothyroxine (SYNTHROID) 25 MCG tablet Take 1 tablet (25 mcg total) by mouth daily before breakfast. 30 tablet 2   lidocaine (XYLOCAINE) 2 % solution Use as directed 15 mLs in the mouth or throat as needed for mouth pain. 100 mL 0   losartan (COZAAR) 100 MG tablet  Take 1 tablet (100 mg total) by mouth daily. 30 tablet 0   Lysine 1000 MG TABS Take 1,000 mg by mouth daily.      Magnesium Oxide 400 (240 Mg) MG TABS Take 400 mg by mouth 2 (two) times daily.      metoCLOPramide (REGLAN) 5 MG tablet Take 1 tablet (5 mg total) by mouth every 8 (eight) hours as needed for nausea or vomiting. 90 tablet 1   metoprolol succinate (TOPROL-XL) 50 MG 24 hr tablet Take 1 tablet (50 mg total) by mouth daily. Take with or immediately following a meal. 90 tablet 3   mometasone-formoterol (DULERA) 200-5 MCG/ACT AERO Inhale 2 puffs into the lungs 2 (two) times daily.     Multiple Vitamins-Minerals (MULTIVITAMIN PO) Take 1 tablet by mouth daily.      Omega-3 Fatty Acids (FISH OIL) 1000 MG CAPS Take 1,000 capsules by mouth 2 (two) times daily.      ondansetron (ZOFRAN-ODT) 8 MG disintegrating tablet Take 1 tablet (8 mg total) by mouth every 8 (eight) hours as needed for nausea or vomiting. 60 tablet 3   oxyCODONE-acetaminophen (PERCOCET) 10-325 MG tablet Take 1 tablet by mouth every 6 (six) hours as needed for pain. 60 tablet 0   polyethylene glycol powder (GLYCOLAX/MIRALAX) 17 GM/SCOOP powder Take 17 g by mouth daily as needed for mild constipation or moderate constipation.      SUMAtriptan (IMITREX) 100 MG tablet May repeat in 2 hours if headache persists or recurs. (Patient taking differently: Take 100 mg by mouth daily as needed for migraine. May repeat in 2 hours if headache persists or recurs.) 9 tablet 1   temazepam (RESTORIL) 30 MG capsule Take 1 capsule (30 mg total) by mouth at bedtime as needed for sleep. 30 capsule 0   torsemide (DEMADEX) 20 MG tablet Take 2 tablets (40 mg total) by mouth 2 (two) times daily. 120 tablet 0   vitamin B-12 (CYANOCOBALAMIN) 1000 MCG tablet Take 1,000 mcg by mouth daily.     vitamin C (ASCORBIC ACID) 500 MG tablet Take 500 mg by mouth daily.     No current facility-administered medications for this visit.     Facility-Administered Medications Ordered in Other Visits  Medication Dose Route Frequency Provider Last Rate Last Dose   0.9 %  sodium chloride infusion   Intravenous Once Lloyd Huger, MD       0.9 %  sodium chloride infusion   Intravenous Once Lloyd Huger, MD       heparin lock flush 100 unit/mL  500 Units Intravenous Once Delight Hoh  J, MD       sodium chloride flush (NS) 0.9 % injection 10 mL  10 mL Intravenous PRN Lloyd Huger, MD   10 mL at 06/16/17 0854   sodium chloride flush (NS) 0.9 % injection 10 mL  10 mL Intravenous Once Lloyd Huger, MD        VITAL SIGNS: There were no vitals taken for this visit. There were no vitals filed for this visit.  Estimated body mass index is 27.07 kg/m as calculated from the following:   Height as of 10/28/18: _0  (1.575 m).   Weight as of an earlier encounter on 11/03/18: 148 lb (67.1 kg).  LABS: CBC:    Component Value Date/Time   WBC 6.5 11/03/2018 1250   HGB 10.0 (L) 11/03/2018 1250   HCT 31.7 (L) 11/03/2018 1250   PLT 253 11/03/2018 1250   MCV 99.4 11/03/2018 1250   NEUTROABS 3.7 11/03/2018 1250   LYMPHSABS 1.0 11/03/2018 1250   MONOABS 1.0 11/03/2018 1250   EOSABS 0.7 (H) 11/03/2018 1250   BASOSABS 0.1 11/03/2018 1250   Comprehensive Metabolic Panel:    Component Value Date/Time   NA 137 11/03/2018 1250   NA 139 07/31/2015 1246   K 4.1 11/03/2018 1250   CL 97 (L) 11/03/2018 1250   CO2 30 11/03/2018 1250   BUN 15 11/03/2018 1250   BUN 15 07/31/2015 1246   CREATININE 2.16 (H) 11/03/2018 1250   CREATININE 0.90 05/17/2016 1052   GLUCOSE 111 (H) 11/03/2018 1250   CALCIUM 8.2 (L) 11/03/2018 1250   AST 17 11/03/2018 1250   AST 32 02/19/2012 0935   ALT 10 11/03/2018 1250   ALT 36 02/19/2012 0935   ALKPHOS 71 11/03/2018 1250   ALKPHOS 96 02/19/2012 0935   BILITOT 0.5 11/03/2018 1250   BILITOT 0.5 07/31/2015 1246   BILITOT 0.4 02/19/2012 0935   PROT 7.0 11/03/2018 1250   PROT  6.6 07/31/2015 1246   PROT 6.9 02/19/2012 0935   ALBUMIN 3.0 (L) 11/03/2018 1250   ALBUMIN 4.3 07/31/2015 1246   ALBUMIN 3.6 02/19/2012 0935    RADIOGRAPHIC STUDIES: Dg Chest 2 View  Result Date: 10/10/2018 CLINICAL DATA:  Ovarian cancer on chemotherapy, hypertension, CHF, asthma, end-stage renal disease on dialysis, former smoker EXAM: CHEST - 2 VIEW COMPARISON:  10/09/2018 FINDINGS: RIGHT jugular Port-A-Cath with tip projecting over SVC. LEFT jugular dual-lumen central venous catheter with tip above cavoatrial junction. Enlargement of cardiac silhouette with pulmonary vascular congestion. Stable mediastinal contours. Bibasilar effusions and mild LEFT basilar atelectasis. Minimal perihilar edema. No pneumothorax or acute osseous findings. IMPRESSION: Enlargement of cardiac silhouette with pulmonary vascular congestion and mild pulmonary edema. Bibasilar effusions and LEFT LEFT basilar atelectasis. Electronically Signed   By: Lavonia Dana M.D.   On: 10/10/2018 17:28   Ct Chest Wo Contrast  Result Date: 10/12/2018 CLINICAL DATA:  Pleural effusion. Clinical sepsis and neutropenic fever. Status post thoracentesis. EXAM: CT CHEST WITHOUT CONTRAST TECHNIQUE: Multidetector CT imaging of the chest was performed following the standard protocol without IV contrast. COMPARISON:  Chest CT 08/09/2018 FINDINGS: Cardiovascular: The heart is enlarged but stable. No pericardial effusion. Stable tortuosity, ectasia and calcification of the thoracic aorta. Scattered coronary artery calcifications. Right-sided Port-A-Cath and left IJ dialysis catheter in good positions without complicating features. Mediastinum/Nodes: Stable scattered mediastinal and hilar lymph nodes without mass or overt adenopathy. The esophagus is grossly normal. Lungs/Pleura: Moderate-sized bilateral pleural effusions with overlying atelectasis. Patchy inflammatory changes in both lungs with areas of  tree-in-bud appearance suggesting chronic  inflammation or atypical infection such as MAC or viral pneumonitis. No worrisome pulmonary lesions. No endobronchial lesions are identified. Upper Abdomen: No significant upper abdominal findings. Musculoskeletal: No significant bony findings. No breast masses, supraclavicular or axillary adenopathy. IMPRESSION: 1. Moderate-sized bilateral pleural effusions with overlying atelectasis. 2. Patchy inflammatory or atypical infectious process in the lungs. No focal pneumonia. No worrisome pulmonary lesions. 3. Stable cardiac enlargement and tortuosity and calcification of the thoracic aorta. 4. Stable mediastinal and hilar lymph nodes without mass or overt adenopathy. Aortic Atherosclerosis (ICD10-I70.0). Electronically Signed   By: Marijo Sanes M.D.   On: 10/12/2018 12:26   US Abdomen Limited  Result Date: 10/09/2018 CLINICAL DATA:  History of end-stage renal disease, now with bilateral pleural effusions and intra-abdominal ascites. Please perform ascites search ultrasound and ultrasound-guided paracentesis as indicated. EXAM: LIMITED ABDOMEN ULTRASOUND FOR ASCITES TECHNIQUE: Limited ultrasound survey for ascites was performed in all four abdominal quadrants. COMPARISON:  CT abdomen pelvis-09/21/2018 FINDINGS: Sonographic evaluation demonstrates a trace amount of intra-abdominal ascites, too small to allow for safe ultrasound-guided paracentesis. Note is made of small bilateral pleural effusions, right greater than left. IMPRESSION: 1. Trace amount of intra-abdominal ascites, too small to allow for safe ultrasound-guided paracentesis. 2. Note is made of small bilateral pleural effusions, right greater than left. Patient subsequently underwent ultrasound-guided right-sided thoracentesis. Electronically Signed   By: Sandi Mariscal M.D.   On: 10/09/2018 16:59   US Venous Img Lower Bilateral  Result Date: 10/09/2018 CLINICAL DATA:  Bilateral lower extremity pain and edema. EXAM: BILATERAL LOWER EXTREMITY VENOUS  DOPPLER ULTRASOUND TECHNIQUE: Gray-scale sonography with graded compression, as well as color Doppler and duplex ultrasound were performed to evaluate the lower extremity deep venous systems from the level of the common femoral vein and including the common femoral, femoral, profunda femoral, popliteal and calf veins including the posterior tibial, peroneal and gastrocnemius veins when visible. The superficial great saphenous vein was also interrogated. Spectral Doppler was utilized to evaluate flow at rest and with distal augmentation maneuvers in the common femoral, femoral and popliteal veins. COMPARISON:  None. FINDINGS: RIGHT LOWER EXTREMITY Common Femoral Vein: No evidence of thrombus. Normal compressibility, respiratory phasicity and response to augmentation. Saphenofemoral Junction: No evidence of thrombus. Normal compressibility and flow on color Doppler imaging. Profunda Femoral Vein: No evidence of thrombus. Normal compressibility and flow on color Doppler imaging. Femoral Vein: No evidence of thrombus. Normal compressibility, respiratory phasicity and response to augmentation. Popliteal Vein: No evidence of thrombus. Normal compressibility, respiratory phasicity and response to augmentation. Calf Veins: No evidence of thrombus. Normal compressibility and flow on color Doppler imaging. Superficial Great Saphenous Vein: No evidence of thrombus. Normal compressibility. Venous Reflux:  None. Other Findings: No evidence of superficial thrombophlebitis or abnormal fluid collection. LEFT LOWER EXTREMITY Common Femoral Vein: No evidence of thrombus. Normal compressibility, respiratory phasicity and response to augmentation. Saphenofemoral Junction: No evidence of thrombus. Normal compressibility and flow on color Doppler imaging. Profunda Femoral Vein: No evidence of thrombus. Normal compressibility and flow on color Doppler imaging. Femoral Vein: No evidence of thrombus. Normal compressibility, respiratory  phasicity and response to augmentation. Popliteal Vein: No evidence of thrombus. Normal compressibility, respiratory phasicity and response to augmentation. Calf Veins: No evidence of thrombus. Normal compressibility and flow on color Doppler imaging. Superficial Great Saphenous Vein: No evidence of thrombus. Normal compressibility. Venous Reflux:  None. Other Findings: No evidence of superficial thrombophlebitis or abnormal fluid collection. IMPRESSION: No evidence of deep venous thrombosis in  either lower extremity. Electronically Signed   By: Aletta Edouard M.D.   On: 10/09/2018 17:41   Dg Chest Port 1 View  Result Date: 10/13/2018 CLINICAL DATA:  Symptomatic bilateral pleural effusions. Please perform left-sided ultrasound-guided thoracentesis for diagnostic and therapeutic purposes. EXAM: PORTABLE CHEST 1 VIEW COMPARISON:  Chest CT - 10/12/2018; chest radiograph - 10/10/2018; ultrasound-guided right-sided thoracentesis - 10/09/2018 FINDINGS: Grossly unchanged enlarged cardiac silhouette and mediastinal contours. Stable position of support apparatus. Interval reduction/resolution of left-sided pleural effusion post thoracentesis. No pneumothorax. Improved aeration of the left lung base. Unchanged small right-sided effusion associated right basilar opacities. No new focal airspace opacities. Pulmonary remains indistinct with cephalization. Unchanged bones. Post lower cervical ACDF, incompletely evaluated. IMPRESSION: 1. Interval reduction/resolution of left-sided pleural effusion post thoracentesis. No pneumothorax. 2. Similar findings of cardiomegaly, pulmonary edema, small right-sided effusion and right basilar opacities, likely atelectasis. Electronically Signed   By: Sandi Mariscal M.D.   On: 10/13/2018 14:23   Dg Chest Port 1 View  Result Date: 10/09/2018 CLINICAL DATA:  Shortness of breath and chest pain, history of recent thoracentesis EXAM: PORTABLE CHEST 1 VIEW COMPARISON:  Film from earlier in the  same day. FINDINGS: Cardiac shadow is again enlarged. Left-sided pleural effusion is again seen and slightly larger than that noted on the prior study. Dialysis catheter and right-sided chest wall port are again seen. No pneumothorax is noted. Mild vascular congestion is seen. IMPRESSION: Slight increase in left-sided pleural effusion. This may be positional in nature. Mild vascular congestion.  No pneumothorax is noted. Electronically Signed   By: Inez Catalina M.D.   On: 10/09/2018 19:45   Dg Chest Port 1 View  Result Date: 10/09/2018 CLINICAL DATA:  Status post right thoracentesis EXAM: PORTABLE CHEST 1 VIEW COMPARISON:  10/08/2018 FINDINGS: Cardiac shadow is stable. Dialysis catheter and right-sided chest wall port are again seen and stable. Postsurgical changes in the cervical spine are noted. Small left pleural effusion is noted. Right-sided pleural effusion has resolved following thoracentesis. No pneumothorax is noted. IMPRESSION: No pneumothorax following right thoracentesis. Small left pleural effusion. Electronically Signed   By: Inez Catalina M.D.   On: 10/09/2018 16:53   Dg Chest Port 1 View  Result Date: 10/08/2018 CLINICAL DATA:  Shortness of breath. EXAM: PORTABLE CHEST 1 VIEW COMPARISON:  Chest x-ray dated August 28, 2018. FINDINGS: Unchanged right chest wall port catheter and tunneled left internal jugular dialysis catheter. Stable cardiomegaly and pulmonary vascular congestion. Unchanged small bilateral pleural effusions. Slightly increased bibasilar atelectasis. No pneumothorax. No acute osseous abnormality. IMPRESSION: 1. Mild congestive heart failure, similar to prior study. Electronically Signed   By: Titus Dubin M.D.   On: 10/08/2018 08:07   US Thoracentesis Asp Pleural Space W/img Guide  Result Date: 10/13/2018 INDICATION: Symptomatic left-sided pleural effusion. Please from ultrasound-guided thoracentesis for therapeutic and diagnostic purposes. EXAM: US THORACENTESIS ASP  PLEURAL SPACE W/IMG GUIDE COMPARISON:  Chest CT-10/12/2018; chest radiograph-10/10/2018; ultrasound-guided right-sided thoracentesis yielding 700 cc of pleural fluid-10/09/2018 MEDICATIONS: None. COMPLICATIONS: None immediate. TECHNIQUE: Informed written consent was obtained from the patient after a discussion of the risks, benefits and alternatives to treatment. A timeout was performed prior to the initiation of the procedure. Initial ultrasound scanning demonstrates a moderate-sized bilateral pleural effusions. Patient requested left-sided ultrasound-guided thoracentesis. As such, the left lower chest was prepped and draped in the usual sterile fashion. 1% lidocaine was used for local anesthesia. An ultrasound image was saved for documentation purposes. An 8 Fr Safe-T-Centesis catheter was introduced. The thoracentesis  was performed. The catheter was removed and a dressing was applied. The patient tolerated the procedure well without immediate post procedural complication. The patient was escorted to have an upright chest radiograph. FINDINGS: A total of approximately 500 liters of serous fluid was removed. Requested samples were sent to the laboratory. IMPRESSION: Successful ultrasound-guided left sided thoracentesis yielding 500 cc of pleural fluid. Electronically Signed   By: Sandi Mariscal M.D.   On: 10/13/2018 14:45   US Thoracentesis Asp Pleural Space W/img Guide  Result Date: 10/09/2018 INDICATION: History of end-stage renal disease, now with symptomatic pleural effusions. Please perform chest ultrasound and ultrasound-guided thoracentesis of the side with greater volume of fluid for therapeutic and diagnostic purposes. EXAM: US THORACENTESIS ASP PLEURAL SPACE W/IMG GUIDE COMPARISON:  Chest radiograph-10/08/2018; CT abdomen pelvis-09/21/2018 MEDICATIONS: None. COMPLICATIONS: None immediate. TECHNIQUE: Informed written consent was obtained from the patient after a discussion of the risks, benefits and  alternatives to treatment. A timeout was performed prior to the initiation of the procedure. Initial ultrasound scanning demonstrates small bilateral pleural effusions, right greater than left. As such the decision was made to proceed with ultrasound-guided right-sided thoracentesis. With the patient positioned left lateral decubitus, the posterolateral aspect of the right chest was prepped and draped in the usual sterile fashion. 1% lidocaine was used for local anesthesia. An ultrasound image was saved for documentation purposes. An 8 Fr Safe-T-Centesis catheter was introduced. The thoracentesis was performed. The catheter was removed and a dressing was applied. The patient tolerated the procedure well without immediate post procedural complication. The patient was escorted to have an upright chest radiograph. FINDINGS: A total of approximately 700 cc liters of serous fluid was removed. Requested samples were sent to the laboratory. IMPRESSION: Successful ultrasound-guided right sided thoracentesis yielding 700 cc of pleural fluid. Electronically Signed   By: Sandi Mariscal M.D.   On: 10/09/2018 17:01    PERFORMANCE STATUS (ECOG) : 3 - Symptomatic, >50% confined to bed  Review of Systems Unless otherwise noted, a complete review of systems is negative.  Physical Exam General: ill appearing, frail appearing, thin Pulmonary: unlabored Skin: no rashes Neurological: Weakness but otherwise nonfocal  IMPRESSION: I met with patient today for routine follow-up.  Patient sister participated in the visit via phone.  Patient reports persistent weakness and overall decline over the past several weeks.  She was very tearful as she described feeling like she cannot get on in her present condition.  We discussed her symptoms being secondary to multiple end-stage disease processes including ESRD, cancer, pulmonary hypertension, and heart failure.  Her quality of life has become quite poor although she has had some  good days mixed in with the bad.  We had a lengthy conversation regarding her goals and options for care including a transition to comfort/hospice.  Patient says she is not ready to stop dialysis and she understands that that is an exclusion for hospice care at the current time.  She is interested in obtaining a CT of the abdomen to further evaluate the state of her cancer.  She would ultimately like to have more conversations regarding goals following that work-up.  Patient is not interested and future hospitalization.  She recognizes that there is high likelihood of a future event that would probably herald her end-of-life without hospitalization.  She would be interested in hospice involvement at that time.  She would want her end-of-life to be at home if possible although we also discussed a residential hospice facility.  We discussed symptom  management at end-of-life.  She witnessed her mother have a high symptom burden throughout the end-of-life process and she is somewhat fearful that that will occur with her.  We talked about management including IV medications if needed.    I completed a MOST form today. The patient and family outlined their wishes for the following treatment decisions:  Cardiopulmonary Resuscitation: Do Not Attempt Resuscitation (DNR/No CPR)  Medical Interventions: Comfort Measures: Keep clean, warm, and dry. Use medication by any route, positioning, wound care, and other measures to relieve pain and suffering. Use oxygen, suction and manual treatment of airway obstruction as needed for comfort. Do not transfer to the hospital unless comfort needs cannot be met in current location.  Antibiotics: No antibiotics (use other measures to relieve symptoms)  IV Fluids: No IV fluids (provide other measures to ensure comfort)  Feeding Tube: No feeding tube    PLAN: -CT ABD with contrast  -Patient wants to continue dialysis for now -She is not interested in [place] or home  palliative care referral -Refill Percocet and increase frequency to Q4H PRN -Add daily senna -MOST form completed (DNAR/Comfort). Patient does not want future hospitalization -Future outpatient therapeutic thoracentesis if needed -RTC in 1-2 weeks after CT scan   Patient expressed understanding and was in agreement with this plan. She also understands that She can call the clinic at any time with any questions, concerns, or complaints.     Time Total: 60 minutes  Visit consisted of counseling and education dealing with the complex and emotionally intense issues of symptom management and palliative care in the setting of serious and potentially life-threatening illness.Greater than 50%  of this time was spent counseling and coordinating care related to the above assessment and plan.  Signed by: Altha Harm, PhD, NP-C [phone] (Work Cell)

## 2018-11-03 NOTE — Progress Notes (Signed)
Pt needs a refill on pain prescription. Reports increased abdominal pain and weakness.

## 2018-11-05 ENCOUNTER — Other Ambulatory Visit: Payer: Self-pay

## 2018-11-05 DIAGNOSIS — R112 Nausea with vomiting, unspecified: Secondary | ICD-10-CM | POA: Insufficient documentation

## 2018-11-06 ENCOUNTER — Inpatient Hospital Stay: Payer: PPO

## 2018-11-06 ENCOUNTER — Ambulatory Visit
Admission: RE | Admit: 2018-11-06 | Discharge: 2018-11-06 | Disposition: A | Discharge status: Home / Self Care | Payer: PPO | Source: Ambulatory Visit | Attending: Oncology | Admitting: Oncology

## 2018-11-06 ENCOUNTER — Other Ambulatory Visit: Payer: Self-pay

## 2018-11-06 DIAGNOSIS — R188 Other ascites: Secondary | ICD-10-CM | POA: Diagnosis not present

## 2018-11-06 DIAGNOSIS — R599 Enlarged lymph nodes, unspecified: Secondary | ICD-10-CM | POA: Diagnosis not present

## 2018-11-06 DIAGNOSIS — I700 Atherosclerosis of aorta: Secondary | ICD-10-CM | POA: Diagnosis not present

## 2018-11-06 DIAGNOSIS — Z515 Encounter for palliative care: Secondary | ICD-10-CM | POA: Diagnosis not present

## 2018-11-06 DIAGNOSIS — C569 Malignant neoplasm of unspecified ovary: Secondary | ICD-10-CM | POA: Insufficient documentation

## 2018-11-06 DIAGNOSIS — J90 Pleural effusion, not elsewhere classified: Secondary | ICD-10-CM | POA: Diagnosis not present

## 2018-11-06 NOTE — Progress Notes (Signed)
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  INTERVAL HISTORY: Patient returns to clinic today for repeat laboratory work, discussion of her imaging results, and further evaluation.  Her weakness and fatigue is still evident, but her performance status is improving. Her shortness of breath is chronic and unchanged.  She denies any recent fevers or illnesses.  She has no neurologic complaints.  She denies any chest pain, cough, or hemoptysis.  She does not complain of any nausea, vomiting, constipation, or diarrhea today.  She denies any abdominal pain or bloating.  She has no urinary complaints.  Patient offers no further specific complaints today.  REVIEW OF SYSTEMS:   Review of Systems  Constitutional: Positive for malaise/fatigue. Negative for fever and weight loss.  HENT: Negative.  Negative for sore throat.   Eyes: Negative.  Negative for blurred vision.  Respiratory: Positive for shortness of breath. Negative for cough.   Cardiovascular: Negative.  Negative for chest pain and leg swelling.  Gastrointestinal: Negative.  Negative for abdominal pain, blood in stool, constipation, diarrhea, melena, nausea and vomiting.  Genitourinary: Negative.  Negative for dysuria.  Musculoskeletal: Negative.  Negative for back pain, joint pain and neck pain.  Skin: Negative.  Negative for  itching and rash.  Neurological: Positive for tingling, sensory change and weakness. Negative for focal weakness.  Psychiatric/Behavioral: Negative.  The patient is not nervous/anxious.      As per HPI. Otherwise, a complete review of systems is negative.  PAST MEDICAL HISTORY: Past Medical History:  Diagnosis Date   Allergic rhinitis, cause unspecified    Anxiety state, unspecified    Arthritis    Asthma    only when sick    Backache, unspecified    Bronchitis    hx of when get sick   Cancer [facility])    skin cancer , basal cell    Cancer ([place]) 11/2016   ovarian   Cervicalgia    CHF (congestive heart failure) (HCC)    Complication of anesthesia    Dermatophytosis of nail    Dysmetabolic syndrome X    Encounter for long-term (current) use of other medications    Esophageal reflux    Hypertension    Insomnia, unspecified    Leukocytosis, unspecified    Migraine without aura, without mention of intractable migraine without mention of status migrainosus    Other and unspecified hyperlipidemia    Other malaise and fatigue    Overweight(278.02)    Personal history of chemotherapy now   ovarian   Renal insufficiency    Spinal stenosis in cervical region    Symptomatic menopausal or female climacteric states    Unspecified disorder of skin and subcutaneous tissue    Unspecified vitamin D deficiency     PAST SURGICAL HISTORY: Past Surgical History:  Procedure Laterality Date   ABDOMINAL HYSTERECTOMY     ANTERIOR CERVICAL DECOMP/DISCECTOMY FUSION N/A 06/07/2015   Procedure: Cervical three - four and Cervical six- seven anterior cervical decompression with fusion  interbody prosthesis plating and bonegraft;  Surgeon: Newman Pies, MD;  Location: [place] [facility]UR[facility] ORS;  Service: Neurosurgery;  Laterality: N/A;  C34 and C67 anterior cervical decompression with fusion interbody prosthesis plating and bonegraft   BACK SURGERY     x2 Lower    DIALYSIS/PERMA  CATHETER INSERTION N/A 08/17/2018   Procedure: DIALYSIS/PERMA CATHETER INSERTION;  Surgeon: Algernon Huxley, MD;  Location: [place] CV LAB;  Service: Cardiovascular;  Laterality: N/A;   EVACUATION OF CERVICAL HEMATOMA N/A 06/14/2015   Procedure: EVACUATION OF CERVICAL HEMATOMA;  Surgeon: Newman Pies, MD;  Location: [place] [facility]UR[facility] OR[facility];  Service: Neurosurgery;  Laterality: N/A;   NECK SURGERY     x3   TONSILLECTOMY      FAMILY HISTORY: Family History  Problem Relation Age of Onset   Depression Mother    Migraines Mother    Dementia Father    Diabetes Father    Hyperlipidemia Father    Hyperlipidemia Brother    Hyperlipidemia Brother    Breast cancer Paternal Aunt        44s    ADVANCED DIRECTIVES (Y/N):  N  HEALTH MAINTENANCE: Social History   Tobacco Use   Smoking status: Former Smoker    Packs/day: 1.50    Years: 20.00    Pack years: 30.00    Types: Cigarettes    Start date: 03/19/1979    Quit date: 08/25/1999    Years since quitting: 19.2   Smokeless tobacco: Never Used   Tobacco comment: smoking cessation materials not required  Substance Use Topics   Alcohol use: Not Currently    Alcohol/week: 0.0 standard drinks   Drug use: No     Colonoscopy:  PAP:  Bone density:  Lipid panel:  No Known Allergies  Current Outpatient Medications  Medication Sig Dispense Refill   albuterol (PROVENTIL HFA;VENTOLIN HFA) 108 (90 Base) MCG/ACT inhaler Inhale 2 puffs into the lungs every 6 (six) hours as needed for wheezing or shortness of breath. 1 Inhaler 2   albuterol (PROVENTIL) (2.5 MG/3ML) 0.083% nebulizer solution Take 3 mLs (2.5 mg total) by nebulization every 6 (six) hours as needed for wheezing or shortness of breath. 75 mL 2   ALPRAZolam (XANAX) 0.25 MG tablet Take 1 tablet (0.25 mg total) by mouth at bedtime as needed for anxiety. 30 tablet 0   amiodarone (PACERONE) 200 MG tablet Take 1 tablet (200 mg total) by mouth daily. 90 tablet 3   apixaban  (ELIQUIS) 5 MG TABS tablet Take 1 tablet (5 mg total) by mouth 2 (two) times daily. 60 tablet 0   cetirizine (ZYRTEC) 10 MG tablet Take 10 mg by mouth daily.     Cholecalciferol (VITAMIN D-1000 MAX ST) 1000 UNITS tablet Take 1,000 Units by mouth 2 (two) times daily.      cyclobenzaprine (FLEXERIL) 10 MG tablet Take 1 tablet (10 mg total) by mouth every 8 (eight) hours as needed for muscle spasms. 30 tablet 1   DULoxetine (CYMBALTA) 60 MG capsule Take 1 capsule (60 mg total) by mouth daily. 90 capsule 0   ferrous sulfate (IRON SUPPLEMENT) 325 (65 FE) MG tablet Take 1 tablet by mouth daily.     hydrALAZINE (APRESOLINE) 25 MG tablet Take 1 tablet (25 mg total) by mouth 3 (three) times daily. For BP if above 150/90 90 tablet 0   ipratropium (ATROVENT HFA) 17 MCG/ACT inhaler Inhale 2 puffs into the lungs every 6 (six) hours as needed for wheezing.      isosorbide mononitrate (  IMDUR) 30 MG 24 hr tablet Take 1 tablet (30 mg total) by mouth daily. 30 tablet 2   levothyroxine (SYNTHROID) 25 MCG tablet Take 1 tablet (25 mcg total) by mouth daily before breakfast. 30 tablet 2   lidocaine (XYLOCAINE) 2 % solution Use as directed 15 mLs in the mouth or throat as needed for mouth pain. 100 mL 0   losartan (COZAAR) 100 MG tablet Take 1 tablet (100 mg total) by mouth daily. 30 tablet 0   Lysine 1000 MG TABS Take 1,000 mg by mouth daily.      Magnesium Oxide 400 (240 Mg) MG TABS Take 400 mg by mouth 2 (two) times daily.      metoCLOPramide (REGLAN) 5 MG tablet Take 1 tablet (5 mg total) by mouth every 8 (eight) hours as needed for nausea or vomiting. 90 tablet 1   metoprolol succinate (TOPROL-XL) 50 MG 24 hr tablet Take 1 tablet (50 mg total) by mouth daily. Take with or immediately following a meal. 90 tablet 3   mometasone-formoterol (DULERA) 200-5 MCG/ACT AERO Inhale 2 puffs into the lungs 2 (two) times daily.     Multiple Vitamins-Minerals (MULTIVITAMIN PO) Take 1 tablet by mouth daily.       Omega-3 Fatty Acids (FISH OIL) 1000 MG CAPS Take 1,000 capsules by mouth 2 (two) times daily.      ondansetron (ZOFRAN-ODT) 8 MG disintegrating tablet Take 1 tablet (8 mg total) by mouth every 8 (eight) hours as needed for nausea or vomiting. 60 tablet 3   polyethylene glycol powder (GLYCOLAX/MIRALAX) 17 GM/SCOOP powder Take 17 g by mouth daily as needed for mild constipation or moderate constipation.      senna (SENOKOT) 8.6 MG TABS tablet Take 1 tablet (8.6 mg total) by mouth daily. 120 tablet 0   SUMAtriptan (IMITREX) 100 MG tablet May repeat in 2 hours if headache persists or recurs. (Patient taking differently: Take 100 mg by mouth daily as needed for migraine. May repeat in 2 hours if headache persists or recurs.) 9 tablet 1   temazepam (RESTORIL) 30 MG capsule Take 1 capsule (30 mg total) by mouth at bedtime as needed for sleep. 30 capsule 0   torsemide (DEMADEX) 20 MG tablet Take 2 tablets (40 mg total) by mouth 2 (two) times daily. 120 tablet 0   vitamin B-12 (CYANOCOBALAMIN) 1000 MCG tablet Take 1,000 mcg by mouth daily.     vitamin C (ASCORBIC ACID) 500 MG tablet Take 500 mg by mouth daily.     fluticasone (FLONASE) 50 MCG/ACT nasal spray Place 2 sprays into both nostrils as needed. 48 g 1   oxyCODONE-acetaminophen (PERCOCET) 10-325 MG tablet Take 1 tablet by mouth every 4 (four) hours as needed for pain. 90 tablet 0   No current facility-administered medications for this visit.    Facility-Administered Medications Ordered in Other Visits  Medication Dose Route Frequency Provider Last Rate Last Dose   0.9 %  sodium chloride infusion   Intravenous Once Grayland Ormond, Kathlene November, MD       0.9 %  sodium chloride infusion   Intravenous Once Lloyd Huger, MD       heparin lock flush 100 unit/mL  500 Units Intravenous Once Lloyd Huger, MD       sodium chloride flush (NS) 0.9 % injection 10 mL  10 mL Intravenous PRN Lloyd Huger, MD   10 mL at 06/16/17 0854    sodium chloride flush (NS) 0.9 % injection 10 mL  10 mL Intravenous Once Lloyd Huger, MD        OBJECTIVE: Vitals:   11/11/18 1124  BP: (!) 156/78  Pulse: 65  Resp: 16  Temp: (!) 96.3 F (35.7 C)  SpO2: 98%     Body mass index is 27.4 kg/m.    ECOG FS:1 - Symptomatic but completely ambulatory  General: No acute distress. Eyes: Pink conjunctiva, anicteric sclera. HEENT: Normocephalic, moist mucous membranes. Lungs: Clear to auscultation bilaterally. Heart: Regular rate and rhythm. No rubs, murmurs, or gallops. Abdomen: Soft, nontender, nondistended. No organomegaly noted, normoactive bowel sounds. Musculoskeletal: No edema, cyanosis, or clubbing. Neuro: Alert, answering all questions appropriately. Cranial nerves grossly intact. Skin: No rashes or petechiae noted. Psych: Normal affect.  LAB RESULTS:  Lab Results  Component Value Date   NA 137 11/03/2018   K 4.1 11/03/2018   CL 97 (L) 11/03/2018   CO2 30 11/03/2018   GLUCOSE 111 (H) 11/03/2018   BUN 15 11/03/2018   CREATININE 2.16 (H) 11/03/2018   CALCIUM 8.2 (L) 11/03/2018   PROT 7.0 11/03/2018   ALBUMIN 3.0 (L) 11/03/2018   AST 17 11/03/2018   ALT 10 11/03/2018   ALKPHOS 71 11/03/2018   BILITOT 0.5 11/03/2018   GFRNONAA 24 (L) 11/03/2018   GFRAA 27 (L) 11/03/2018    Lab Results  Component Value Date   WBC 6.5 11/03/2018   NEUTROABS 3.7 11/03/2018   HGB 10.0 (L) 11/03/2018   HCT 31.7 (L) 11/03/2018   MCV 99.4 11/03/2018   PLT 253 11/03/2018     STUDIES: Ct Abdomen Pelvis Wo Contrast  Result Date: 11/06/2018 CLINICAL DATA:  Restaging of ovarian cancer. Lower abdominal/pelvic pain. Stage III ovarian cancer. Prior hysterectomy. EXAM: CT ABDOMEN AND PELVIS WITHOUT CONTRAST TECHNIQUE: Multidetector CT imaging of the abdomen and pelvis was performed following the standard protocol without IV contrast. COMPARISON:  09/21/2018 FINDINGS: Lower chest: Bibasilar atelectasis. Small, right greater than left  pleural effusions are minimally increased. Hepatobiliary: Normal noncontrast appearance of the liver. Cholecystectomy, without biliary ductal dilatation. Pancreas: Normal, without mass or ductal dilatation. Spleen: Normal in size, without focal abnormality. Adrenals/Urinary Tract: Normal adrenal glands. No renal calculi or hydronephrosis. No hydroureter or ureteric calculi. No bladder calculi. Stomach/Bowel: Normal stomach, without wall thickening. Colonic stool burden suggests constipation. Normal terminal ileum. Normal small bowel caliber, without evidence of obstruction Vascular/Lymphatic: Aortic atherosclerosis. No abdominal adenopathy. An 11 mm right external iliac node is newly enlarged since the prior. Reproductive: Hysterectomy.  No adnexal mass. Other: Increase in small volume abdominal ascites. Similar small volume pelvic fluid. Limited evaluation for omental/peritoneal disease secondary to noncontrast technique. Possible left-sided omental nodularity at 7 mm on 39/2. slightly more well-defined than on the prior. Midline possible omental nodularity at 4 mm on image 37/2, possibly new. Fat containing ventral abdominal wall laxity including on 29/2. Musculoskeletal: Degenerative partial fusion of the bilateral sacroiliac joints. L4-5 trans pedicle screw fixation. Lumbosacral spondylosis. IMPRESSION: 1. Slight increase in bilateral pleural effusions and abdominal ascites. No bowel obstruction or other acute complication. 2. Suboptimal evaluation for mental/peritoneal metastasis secondary to noncontrast technique. Suspicion of mild omental nodularity, as detailed above. 3. New mild right external iliac adenopathy, suspicious for metastatic disease. 4.  Aortic Atherosclerosis (ICD10-I70.0). Electronically Signed   By: Abigail Miyamoto M.D.   On: 11/06/2018 14:59   Dg Chest Port 1 View  Result Date: 10/13/2018 CLINICAL DATA:  Symptomatic bilateral pleural effusions. Please perform left-sided ultrasound-guided  thoracentesis for diagnostic and therapeutic purposes. EXAM: PORTABLE  CHEST 1 VIEW COMPARISON:  Chest CT - 10/12/2018; chest radiograph - 10/10/2018; ultrasound-guided right-sided thoracentesis - 10/09/2018 FINDINGS: Grossly unchanged enlarged cardiac silhouette and mediastinal contours. Stable position of support apparatus. Interval reduction/resolution of left-sided pleural effusion post thoracentesis. No pneumothorax. Improved aeration of the left lung base. Unchanged small right-sided effusion associated right basilar opacities. No new focal airspace opacities. Pulmonary remains indistinct with cephalization. Unchanged bones. Post lower cervical ACDF, incompletely evaluated. IMPRESSION: 1. Interval reduction/resolution of left-sided pleural effusion post thoracentesis. No pneumothorax. 2. Similar findings of cardiomegaly, pulmonary edema, small right-sided effusion and right basilar opacities, likely atelectasis. Electronically Signed   By: Sandi Mariscal M.D.   On: 10/13/2018 14:23   US Thoracentesis Asp Pleural Space W/img Guide  Result Date: 10/13/2018 INDICATION: Symptomatic left-sided pleural effusion. Please from ultrasound-guided thoracentesis for therapeutic and diagnostic purposes. EXAM: US THORACENTESIS ASP PLEURAL SPACE W/IMG GUIDE COMPARISON:  Chest CT-10/12/2018; chest radiograph-10/10/2018; ultrasound-guided right-sided thoracentesis yielding 700 cc of pleural fluid-10/09/2018 MEDICATIONS: None. COMPLICATIONS: None immediate. TECHNIQUE: Informed written consent was obtained from the patient after a discussion of the risks, benefits and alternatives to treatment. A timeout was performed prior to the initiation of the procedure. Initial ultrasound scanning demonstrates a moderate-sized bilateral pleural effusions. Patient requested left-sided ultrasound-guided thoracentesis. As such, the left lower chest was prepped and draped in the usual sterile fashion. 1% lidocaine was used for local anesthesia.  An ultrasound image was saved for documentation purposes. An 8 Fr Safe-T-Centesis catheter was introduced. The thoracentesis was performed. The catheter was removed and a dressing was applied. The patient tolerated the procedure well without immediate post procedural complication. The patient was escorted to have an upright chest radiograph. FINDINGS: A total of approximately 500 liters of serous fluid was removed. Requested samples were sent to the laboratory. IMPRESSION: Successful ultrasound-guided left sided thoracentesis yielding 500 cc of pleural fluid. Electronically Signed   By: Sandi Mariscal M.D.   On: 10/13/2018 14:45    ONCOLOGY HISTORY: Patient underwent debulking surgery at [facility] on November 19, 2016. She received one infusion preoperative infusion of Keytruda on November 11, 2016 as part of a clinical trial.  She initiated adjuvant carboplatinum and Taxol on December 16, 2016.  This was continued through April 28, 2017 at which time Taxol was discontinued secondary to worsening neuropathy.  Patient then received single agent carboplatinum for additional 2 infusions.  She was then noted to have progression of disease and was switched to Avastin and doxorubicin on August 18, 2017 this was discontinued on November 24, 2017 secondary to declining performance status.  Palliative gemcitabine was initiated on February 20, 2018 which was then discontinued Jul 27, 2018 secondary to declining performance status and end-stage renal disease.  Gemcitabine was reinitiated on September 24, 2018, but only received 2 doses her last 1 being given on October 06, 2018..   ASSESSMENT: Stage IIIc high-grade serous ovarian carcinoma  PLAN:  1. Stage IIIc high-grade serous ovarian carcinoma: See oncology history as above. BRCA and germline testing were negative on initial pathology.  Patient's CA-125 is trending up and is now 779.  CT scan results from November 06, 2018 reviewed independently and report as above with  suggestion of progression of disease, but difficult to ascertain given the Burgess of IV contrast.  Patient has increased abdominal pain and bloating.  After lengthy discussion with the patient, her sister, and palliative care, patient does not wish to pursue any further treatments at this time.  She is also not yet  ready to enroll in hospice and would like to continue with dialysis.  No further follow-up with medical oncology is necessary at this time.  Patient will have laboratory work and follow-up with palliative care in 1 month. 2.  Shortness of breath: Likely multifactorial.  Continue dialysis 3 days a week as planned on Tuesday, Thursday, and Saturday.  Patient also has significant pulmonary hypertension that is likely contributing.  Continue oxygen 24 hours/day as prescribed.  Appreciate cardiology input. 3.  Acute renal failure: Continue dialysis as above. 4.  Pain: Abdominal pain is slightly worse today.  Increase oxycodone to every 4 hours.  Appreciate palliative care input. 5.  Anemia: Chronic and unchanged.  Okay to give Procrit with dialysis.  6.  Thrombocytopenia: Resolved.  I spent a total of 30 minutes face-to-face with the patient of which greater than 50% of the visit was spent in counseling and coordination of care as detailed above.     Lloyd Huger, MD   11/12/2018 7:07 AM

## 2018-11-07 LAB — CA 125: Cancer Antigen (CA) 125: 779 U/mL — ABNORMAL HIGH (ref 0.0–38.1)

## 2018-11-11 ENCOUNTER — Inpatient Hospital Stay (HOSPITAL_BASED_OUTPATIENT_CLINIC_OR_DEPARTMENT_OTHER): Payer: PPO | Admitting: Oncology

## 2018-11-11 ENCOUNTER — Encounter: Payer: Self-pay | Admitting: Oncology

## 2018-11-11 ENCOUNTER — Other Ambulatory Visit: Payer: Self-pay | Admitting: Family Medicine

## 2018-11-11 ENCOUNTER — Inpatient Hospital Stay (HOSPITAL_BASED_OUTPATIENT_CLINIC_OR_DEPARTMENT_OTHER): Payer: PPO | Admitting: Hospice and Palliative Medicine

## 2018-11-11 ENCOUNTER — Other Ambulatory Visit: Payer: Self-pay

## 2018-11-11 VITALS — BP 156/78 | HR 65 | Temp 96.3°F | Resp 16 | Wt 149.8 lb

## 2018-11-11 DIAGNOSIS — G8929 Other chronic pain: Secondary | ICD-10-CM

## 2018-11-11 DIAGNOSIS — Z515 Encounter for palliative care: Secondary | ICD-10-CM | POA: Diagnosis not present

## 2018-11-11 DIAGNOSIS — M5441 Lumbago with sciatica, right side: Secondary | ICD-10-CM | POA: Diagnosis not present

## 2018-11-11 DIAGNOSIS — M542 Cervicalgia: Secondary | ICD-10-CM

## 2018-11-11 DIAGNOSIS — C569 Malignant neoplasm of unspecified ovary: Secondary | ICD-10-CM

## 2018-11-11 DIAGNOSIS — G4700 Insomnia, unspecified: Secondary | ICD-10-CM

## 2018-11-11 MED ORDER — OXYCODONE-ACETAMINOPHEN 10-325 MG PO TABS: 1.0000 | ORAL_TABLET | ORAL | 0 refills | Status: DC | PRN

## 2018-11-11 NOTE — Telephone Encounter (Signed)
Requested medication (s) are due for refill today: yes  Requested medication (s) are on the active medication list: yes  Last refill:  09/16/2018  Future visit scheduled: yes  Notes to clinic:The original prescription was discontinued on 08/18/2018 by Gladstone Lighter, MD for the following reason: Stop Taking at Discharge. Renewing this prescription may not be appropriate.   Requested Prescriptions  Pending Prescriptions Disp Refills   temazepam (RESTORIL) 15 MG capsule [Pharmacy Med Name: TEMAZEPAM 15MG  CAPSULES] 30 capsule     Sig: TAKE 1 CAPSULE(15 MG) BY MOUTH AT BEDTIME AS NEEDED FOR SLEEP     Not Delegated - Psychiatry:  Anxiolytics/Hypnotics Failed - 11/11/2018  3:28 AM      Failed - This refill cannot be delegated      Failed - Urine Drug Screen completed in last 360 days.      Passed - Valid encounter within last 6 months    Recent Outpatient Visits          2 weeks ago Hemodialysis patient [facility])   [facility] Steele Sizer, MD   1 month ago Uncontrolled hypertension   [facility] Steele Sizer, MD   1 month ago Hemodialysis patient [facility])   [facility] Steele Sizer, MD   2 months ago CHF (congestive heart failure), NYHA class IV, chronic, systolic [facility])   [facility] Steele Sizer, MD   8 months ago Major depression, chronic [facility])   [facility] Steele Sizer, MD      Future Appointments            In 4 weeks End, Harrell Gave, MD [facility], [facility]   In 1 month Steele Sizer, MD [facility], [place]   In 10 months  [facility], [facility]

## 2018-11-11 NOTE — Progress Notes (Signed)
[place]  Telephone:([phone] [phone]   Name: Jessica Burgess Date: 11/11/2018 MRN: [id number]  DOB: 1955-05-15  Patient Care Team: Steele Sizer, MD as PCP - General (Family Medicine) End, Harrell Gave, MD as PCP - Cardiology (Cardiology) Lloyd Huger, MD as Consulting Physician (Oncology) Mellody Drown, MD as Consulting Physician (Obstetrics and Gynecology) Cathi Roan, [facility] (Pharmacist) Benedetto Goad, RN as Case Manager Clent Jacks, RN as Registered Nurse    REASON FOR CONSULTATION: Palliative Care consult requested for this 63 y.o. female with multiple medical problems including stage IIIc high-grade serous ovarian carcinoma.  She is status post debulking surgery at [facility] in 2018 and received one preoperative dose of Keytruda as part of a clinical trial.  Patient completed adjuvant carboplatinum and Taxol, which was discontinued secondary to worsening neuropathy.  Patient was also tried on Avastin and doxorubicin due to disease progression but was ultimately switched to gemcitabine secondary to declining performance status.  She has been on treatment with gemcitabine since December 2019.    PMH is also notable for ESRD recently started on dialysis and severe diastolic dysfunction with recurrent CHF and severe pulmonary HTN.  Patient was recently hospitalized 08/28/2018 to 08/30/2018 with CHF.  Patient was referred to palliative care to help address symptoms and goals.    SOCIAL HISTORY:     reports that she quit smoking about 19 years ago. Her smoking use included cigarettes. She started smoking about 39 years ago. She has a 30.00 pack-year smoking history. She has never used smokeless tobacco. She reports previous alcohol use. She reports that she does not use drugs.   Patient is unmarried. Has no children. She lives at home with her sister. She also has two brothers. Patient formerly worked at the  [employer] and then [employer] improvement.  ADVANCE DIRECTIVES:  Sister is HCPOA  CODE STATUS: DNR (MOST form completed 11/03/18)  PAST MEDICAL HISTORY: Past Medical History:  Diagnosis Date   Allergic rhinitis, cause unspecified    Anxiety state, unspecified    Arthritis    Asthma    only when sick    Backache, unspecified    Bronchitis    hx of when get sick   Cancer [facility])    skin cancer , basal cell    Cancer ([place]) 11/2016   ovarian   Cervicalgia    CHF (congestive heart failure) (HCC)    Complication of anesthesia    Dermatophytosis of nail    Dysmetabolic syndrome X    Encounter for long-term (current) use of other medications    Esophageal reflux    Hypertension    Insomnia, unspecified    Leukocytosis, unspecified    Migraine without aura, without mention of intractable migraine without mention of status migrainosus    Other and unspecified hyperlipidemia    Other malaise and fatigue    Overweight(278.02)    Personal history of chemotherapy now   ovarian   Renal insufficiency    Spinal stenosis in cervical region    Symptomatic menopausal or female climacteric states    Unspecified disorder of skin and subcutaneous tissue    Unspecified vitamin D deficiency     PAST SURGICAL HISTORY:  Past Surgical History:  Procedure Laterality Date   ABDOMINAL HYSTERECTOMY     ANTERIOR CERVICAL DECOMP/DISCECTOMY FUSION N/A 06/07/2015   Procedure: Cervical three - four and Cervical six- seven anterior cervical decompression with fusion interbody prosthesis plating and bonegraft;  Surgeon: Newman Pies, MD;  Location: [facility] NEURO ORS;  Service: Neurosurgery;  Laterality: N/A;  C34 and C67 anterior cervical decompression with fusion interbody prosthesis plating and bonegraft   BACK SURGERY     x2 Lower    DIALYSIS/PERMA CATHETER INSERTION N/A 08/17/2018   Procedure: DIALYSIS/PERMA CATHETER INSERTION;  Surgeon: Algernon Huxley, MD;  Location:  [place] CV LAB;  Service: Cardiovascular;  Laterality: N/A;   EVACUATION OF CERVICAL HEMATOMA N/A 06/14/2015   Procedure: EVACUATION OF CERVICAL HEMATOMA;  Surgeon: Newman Pies, MD;  Location: [place] NEURO ORS;  Service: Neurosurgery;  Laterality: N/A;   NECK SURGERY     x3   TONSILLECTOMY      HEMATOLOGY/ONCOLOGY HISTORY:  Oncology History  Ovarian cancer, unspecified laterality ([place])  11/12/2016 Initial Diagnosis   Ovarian cancer, unspecified laterality ([place])   08/08/2017 - 12/07/2017 Chemotherapy   The patient had bevacizumab (AVASTIN) 700 mg in sodium chloride 0.9 % 100 mL chemo infusion, 725 mg, Intravenous,  Once, 4 of 6 cycles Administration: 700 mg (08/18/2017), 700 mg (09/01/2017), 700 mg (09/15/2017), 700 mg (09/29/2017), 700 mg (10/13/2017), 700 mg (10/27/2017), 700 mg (11/10/2017), 700 mg (11/24/2017) DOXOrubicin HCL LIPOSOMAL (DOXIL) 70 mg in dextrose 5 % 250 mL chemo infusion, 72 mg, Intravenous,  Once, 4 of 6 cycles Administration: 70 mg (08/18/2017), 70 mg (09/15/2017), 70 mg (10/13/2017), 70 mg (11/10/2017)  for chemotherapy treatment.    02/19/2018 -  Chemotherapy   The patient had gemcitabine (GEMZAR) 1,800 mg in sodium chloride 0.9 % 250 mL chemo infusion, 1,748 mg, Intravenous,  Once, 7 of 9 cycles Dose modification: 900 mg/m2 (original dose 1,000 mg/m2, Cycle 1, Reason: Other (see comments)) Administration: 1,800 mg (02/19/2018), 1,800 mg (02/26/2018), 1,600 mg (03/05/2018), 1,600 mg (03/23/2018), 1,600 mg (03/30/2018), 1,600 mg (04/06/2018), 1,600 mg (04/20/2018), 1,600 mg (04/27/2018), 1,600 mg (05/25/2018), 1,600 mg (06/01/2018), 1,600 mg (06/08/2018), 1,600 mg (06/22/2018), 1,600 mg (06/29/2018), 1,600 mg (07/13/2018), 1,600 mg (07/27/2018), 1,600 mg (09/24/2018), 1,600 mg (10/06/2018)  for chemotherapy treatment.      ALLERGIES:  has No Known Allergies.  MEDICATIONS:  Current Outpatient Medications  Medication Sig Dispense Refill   albuterol (PROVENTIL HFA;VENTOLIN HFA) 108 (90 Base)  MCG/ACT inhaler Inhale 2 puffs into the lungs every 6 (six) hours as needed for wheezing or shortness of breath. 1 Inhaler 2   albuterol (PROVENTIL) (2.5 MG/3ML) 0.083% nebulizer solution Take 3 mLs (2.5 mg total) by nebulization every 6 (six) hours as needed for wheezing or shortness of breath. 75 mL 2   ALPRAZolam (XANAX) 0.25 MG tablet Take 1 tablet (0.25 mg total) by mouth at bedtime as needed for anxiety. 30 tablet 0   amiodarone (PACERONE) 200 MG tablet Take 1 tablet (200 mg total) by mouth daily. 90 tablet 3   apixaban (ELIQUIS) 5 MG TABS tablet Take 1 tablet (5 mg total) by mouth 2 (two) times daily. 60 tablet 0   cetirizine (ZYRTEC) 10 MG tablet Take 10 mg by mouth daily.     Cholecalciferol (VITAMIN D-1000 MAX ST) 1000 UNITS tablet Take 1,000 Units by mouth 2 (two) times daily.      cyclobenzaprine (FLEXERIL) 10 MG tablet Take 1 tablet (10 mg total) by mouth every 8 (eight) hours as needed for muscle spasms. 30 tablet 1   DULoxetine (CYMBALTA) 60 MG capsule Take 1 capsule (60 mg total) by mouth daily. 90 capsule 0   ferrous sulfate (IRON SUPPLEMENT) 325 (65 FE) MG tablet Take 1 tablet by mouth daily.  fluticasone (FLONASE) 50 MCG/ACT nasal spray Place 2 sprays into both nostrils as needed. 48 g 1   hydrALAZINE (APRESOLINE) 25 MG tablet Take 1 tablet (25 mg total) by mouth 3 (three) times daily. For BP if above 150/90 90 tablet 0   ipratropium (ATROVENT HFA) 17 MCG/ACT inhaler Inhale 2 puffs into the lungs every 6 (six) hours as needed for wheezing.      isosorbide mononitrate (IMDUR) 30 MG 24 hr tablet Take 1 tablet (30 mg total) by mouth daily. 30 tablet 2   levothyroxine (SYNTHROID) 25 MCG tablet Take 1 tablet (25 mcg total) by mouth daily before breakfast. 30 tablet 2   lidocaine (XYLOCAINE) 2 % solution Use as directed 15 mLs in the mouth or throat as needed for mouth pain. 100 mL 0   losartan (COZAAR) 100 MG tablet Take 1 tablet (100 mg total) by mouth daily. 30  tablet 0   Lysine 1000 MG TABS Take 1,000 mg by mouth daily.      Magnesium Oxide 400 (240 Mg) MG TABS Take 400 mg by mouth 2 (two) times daily.      metoCLOPramide (REGLAN) 5 MG tablet Take 1 tablet (5 mg total) by mouth every 8 (eight) hours as needed for nausea or vomiting. 90 tablet 1   metoprolol succinate (TOPROL-XL) 50 MG 24 hr tablet Take 1 tablet (50 mg total) by mouth daily. Take with or immediately following a meal. 90 tablet 3   mometasone-formoterol (DULERA) 200-5 MCG/ACT AERO Inhale 2 puffs into the lungs 2 (two) times daily.     Multiple Vitamins-Minerals (MULTIVITAMIN PO) Take 1 tablet by mouth daily.      Omega-3 Fatty Acids (FISH OIL) 1000 MG CAPS Take 1,000 capsules by mouth 2 (two) times daily.      ondansetron (ZOFRAN-ODT) 8 MG disintegrating tablet Take 1 tablet (8 mg total) by mouth every 8 (eight) hours as needed for nausea or vomiting. 60 tablet 3   oxyCODONE-acetaminophen (PERCOCET) 10-325 MG tablet Take 1 tablet by mouth every 4 (four) hours as needed for pain. 60 tablet 0   polyethylene glycol powder (GLYCOLAX/MIRALAX) 17 GM/SCOOP powder Take 17 g by mouth daily as needed for mild constipation or moderate constipation.      senna (SENOKOT) 8.6 MG TABS tablet Take 1 tablet (8.6 mg total) by mouth daily. 120 tablet 0   SUMAtriptan (IMITREX) 100 MG tablet May repeat in 2 hours if headache persists or recurs. (Patient taking differently: Take 100 mg by mouth daily as needed for migraine. May repeat in 2 hours if headache persists or recurs.) 9 tablet 1   temazepam (RESTORIL) 30 MG capsule Take 1 capsule (30 mg total) by mouth at bedtime as needed for sleep. 30 capsule 0   torsemide (DEMADEX) 20 MG tablet Take 2 tablets (40 mg total) by mouth 2 (two) times daily. 120 tablet 0   vitamin B-12 (CYANOCOBALAMIN) 1000 MCG tablet Take 1,000 mcg by mouth daily.     vitamin C (ASCORBIC ACID) 500 MG tablet Take 500 mg by mouth daily.     No current facility-administered  medications for this visit.    Facility-Administered Medications Ordered in Other Visits  Medication Dose Route Frequency Provider Last Rate Last Dose   0.9 %  sodium chloride infusion   Intravenous Once Lloyd Huger, MD       0.9 %  sodium chloride infusion   Intravenous Once Grayland Ormond Kathlene November, MD       heparin lock flush  100 unit/mL  500 Units Intravenous Once Lloyd Huger, MD       sodium chloride flush (NS) 0.9 % injection 10 mL  10 mL Intravenous PRN Lloyd Huger, MD   10 mL at 06/16/17 0854   sodium chloride flush (NS) 0.9 % injection 10 mL  10 mL Intravenous Once Lloyd Huger, MD        VITAL SIGNS: There were no vitals taken for this visit. There were no vitals filed for this visit.  Estimated body mass index is 27.4 kg/m as calculated from the following:   Height as of 10/28/18: _0  (1.575 m).   Weight as of an earlier encounter on 11/11/18: 149 lb 12.8 oz (67.9 kg).  LABS: CBC:    Component Value Date/Time   WBC 6.5 11/03/2018 1250   HGB 10.0 (L) 11/03/2018 1250   HCT 31.7 (L) 11/03/2018 1250   PLT 253 11/03/2018 1250   MCV 99.4 11/03/2018 1250   NEUTROABS 3.7 11/03/2018 1250   LYMPHSABS 1.0 11/03/2018 1250   MONOABS 1.0 11/03/2018 1250   EOSABS 0.7 (H) 11/03/2018 1250   BASOSABS 0.1 11/03/2018 1250   Comprehensive Metabolic Panel:    Component Value Date/Time   NA 137 11/03/2018 1250   NA 139 07/31/2015 1246   K 4.1 11/03/2018 1250   CL 97 (L) 11/03/2018 1250   CO2 30 11/03/2018 1250   BUN 15 11/03/2018 1250   BUN 15 07/31/2015 1246   CREATININE 2.16 (H) 11/03/2018 1250   CREATININE 0.90 05/17/2016 1052   GLUCOSE 111 (H) 11/03/2018 1250   CALCIUM 8.2 (L) 11/03/2018 1250   AST 17 11/03/2018 1250   AST 32 02/19/2012 0935   ALT 10 11/03/2018 1250   ALT 36 02/19/2012 0935   ALKPHOS 71 11/03/2018 1250   ALKPHOS 96 02/19/2012 0935   BILITOT 0.5 11/03/2018 1250   BILITOT 0.5 07/31/2015 1246   BILITOT 0.4 02/19/2012 0935    PROT 7.0 11/03/2018 1250   PROT 6.6 07/31/2015 1246   PROT 6.9 02/19/2012 0935   ALBUMIN 3.0 (L) 11/03/2018 1250   ALBUMIN 4.3 07/31/2015 1246   ALBUMIN 3.6 02/19/2012 0935    RADIOGRAPHIC STUDIES: Ct Abdomen Pelvis Wo Contrast  Result Date: 11/06/2018 CLINICAL DATA:  Restaging of ovarian cancer. Lower abdominal/pelvic pain. Stage III ovarian cancer. Prior hysterectomy. EXAM: CT ABDOMEN AND PELVIS WITHOUT CONTRAST TECHNIQUE: Multidetector CT imaging of the abdomen and pelvis was performed following the standard protocol without IV contrast. COMPARISON:  09/21/2018 FINDINGS: Lower chest: Bibasilar atelectasis. Small, right greater than left pleural effusions are minimally increased. Hepatobiliary: Normal noncontrast appearance of the liver. Cholecystectomy, without biliary ductal dilatation. Pancreas: Normal, without mass or ductal dilatation. Spleen: Normal in size, without focal abnormality. Adrenals/Urinary Tract: Normal adrenal glands. No renal calculi or hydronephrosis. No hydroureter or ureteric calculi. No bladder calculi. Stomach/Bowel: Normal stomach, without wall thickening. Colonic stool burden suggests constipation. Normal terminal ileum. Normal small bowel caliber, without evidence of obstruction Vascular/Lymphatic: Aortic atherosclerosis. No abdominal adenopathy. An 11 mm right external iliac node is newly enlarged since the prior. Reproductive: Hysterectomy.  No adnexal mass. Other: Increase in small volume abdominal ascites. Similar small volume pelvic fluid. Limited evaluation for omental/peritoneal disease secondary to noncontrast technique. Possible left-sided omental nodularity at 7 mm on 39/2. slightly more well-defined than on the prior. Midline possible omental nodularity at 4 mm on image 37/2, possibly new. Fat containing ventral abdominal wall laxity including on 29/2. Musculoskeletal: Degenerative partial fusion of the bilateral  sacroiliac joints. L4-5 trans pedicle screw  fixation. Lumbosacral spondylosis. IMPRESSION: 1. Slight increase in bilateral pleural effusions and abdominal ascites. No bowel obstruction or other acute complication. 2. Suboptimal evaluation for mental/peritoneal metastasis secondary to noncontrast technique. Suspicion of mild omental nodularity, as detailed above. 3. New mild right external iliac adenopathy, suspicious for metastatic disease. 4.  Aortic Atherosclerosis (ICD10-I70.0). Electronically Signed   By: Abigail Miyamoto M.D.   On: 11/06/2018 14:59   Dg Chest Port 1 View  Result Date: 10/13/2018 CLINICAL DATA:  Symptomatic bilateral pleural effusions. Please perform left-sided ultrasound-guided thoracentesis for diagnostic and therapeutic purposes. EXAM: PORTABLE CHEST 1 VIEW COMPARISON:  Chest CT - 10/12/2018; chest radiograph - 10/10/2018; ultrasound-guided right-sided thoracentesis - 10/09/2018 FINDINGS: Grossly unchanged enlarged cardiac silhouette and mediastinal contours. Stable position of support apparatus. Interval reduction/resolution of left-sided pleural effusion post thoracentesis. No pneumothorax. Improved aeration of the left lung base. Unchanged small right-sided effusion associated right basilar opacities. No new focal airspace opacities. Pulmonary remains indistinct with cephalization. Unchanged bones. Post lower cervical ACDF, incompletely evaluated. IMPRESSION: 1. Interval reduction/resolution of left-sided pleural effusion post thoracentesis. No pneumothorax. 2. Similar findings of cardiomegaly, pulmonary edema, small right-sided effusion and right basilar opacities, likely atelectasis. Electronically Signed   By: Sandi Mariscal M.D.   On: 10/13/2018 14:23   US Thoracentesis Asp Pleural Space W/img Guide  Result Date: 10/13/2018 INDICATION: Symptomatic left-sided pleural effusion. Please from ultrasound-guided thoracentesis for therapeutic and diagnostic purposes. EXAM: US THORACENTESIS ASP PLEURAL SPACE W/IMG GUIDE COMPARISON:   Chest CT-10/12/2018; chest radiograph-10/10/2018; ultrasound-guided right-sided thoracentesis yielding 700 cc of pleural fluid-10/09/2018 MEDICATIONS: None. COMPLICATIONS: None immediate. TECHNIQUE: Informed written consent was obtained from the patient after a discussion of the risks, benefits and alternatives to treatment. A timeout was performed prior to the initiation of the procedure. Initial ultrasound scanning demonstrates a moderate-sized bilateral pleural effusions. Patient requested left-sided ultrasound-guided thoracentesis. As such, the left lower chest was prepped and draped in the usual sterile fashion. 1% lidocaine was used for local anesthesia. An ultrasound image was saved for documentation purposes. An 8 Fr Safe-T-Centesis catheter was introduced. The thoracentesis was performed. The catheter was removed and a dressing was applied. The patient tolerated the procedure well without immediate post procedural complication. The patient was escorted to have an upright chest radiograph. FINDINGS: A total of approximately 500 liters of serous fluid was removed. Requested samples were sent to the laboratory. IMPRESSION: Successful ultrasound-guided left sided thoracentesis yielding 500 cc of pleural fluid. Electronically Signed   By: Sandi Mariscal M.D.   On: 10/13/2018 14:45    PERFORMANCE STATUS (ECOG) : 3 - Symptomatic, >50% confined to bed  Review of Systems Unless otherwise noted, a complete review of systems is negative.  Physical Exam General: ill appearing, frail appearing, thin Pulmonary: unlabored Skin: no rashes Neurological: Weakness but otherwise nonfocal  IMPRESSION: I met with patient today for routine follow-up.  Patient sister participated in the visit via phone.  Patient reports that she is actually done significantly better over the past week.  She has had more energy and has been eating 3 full meals a day.  Together, we reviewed patient's CT scan, which shows evidence of  disease progression.  Her CA 125 is also rising.  Patient says that she is "at peace" and will "get to see my mom and dad sooner than I thought."  Hospice would be generally appropriate at this stage but patient wants to continue dialysis for now.  She recognizes that she will  likely hit a point in the future where she can no longer tolerate dialysis and then would most likely enroll in hospice care.  We discussed option of future treatment but patient and family agreed that they would want to focus instead on her comfort and quality of life going forward.  There is no plan for regular medical oncology follow-up.  Will instead plan a palliative care visit by phone in 1 month.  I again offered home-based palliative care but patient declined.  PLAN: -Best supportive care -She is not interested in [place] or home palliative care referral -Refill Percocet  Q4H PRN (#90) -Continue daily senna -Televisit in 1 month   Patient expressed understanding and was in agreement with this plan. She also understands that She can call the clinic at any time with any questions, concerns, or complaints.     Time Total: 20 minutes  Visit consisted of counseling and education dealing with the complex and emotionally intense issues of symptom management and palliative care in the setting of serious and potentially life-threatening illness.Greater than 50%  of this time was spent counseling and coordinating care related to the above assessment and plan.  Signed by: Altha Harm, PhD, NP-C [phone] (Work Cell)

## 2018-11-13 NOTE — Telephone Encounter (Signed)
Spoke with patient and she states that is correct patient does take the 30 mg of Temazepam. She ask we D/C the 15mg  of Temazepam.

## 2018-11-16 DIAGNOSIS — N186 End stage renal disease: Secondary | ICD-10-CM | POA: Diagnosis not present

## 2018-11-16 DIAGNOSIS — Z992 Dependence on renal dialysis: Secondary | ICD-10-CM | POA: Diagnosis not present

## 2018-11-17 ENCOUNTER — Telehealth: Payer: Self-pay

## 2018-11-17 ENCOUNTER — Other Ambulatory Visit: Payer: Self-pay | Admitting: Hospice and Palliative Medicine

## 2018-11-17 DIAGNOSIS — Z992 Dependence on renal dialysis: Secondary | ICD-10-CM | POA: Diagnosis not present

## 2018-11-17 DIAGNOSIS — D631 Anemia in chronic kidney disease: Secondary | ICD-10-CM | POA: Diagnosis not present

## 2018-11-17 DIAGNOSIS — N186 End stage renal disease: Secondary | ICD-10-CM | POA: Diagnosis not present

## 2018-11-17 DIAGNOSIS — N184 Chronic kidney disease, stage 4 (severe): Secondary | ICD-10-CM | POA: Diagnosis not present

## 2018-11-17 DIAGNOSIS — C569 Malignant neoplasm of unspecified ovary: Secondary | ICD-10-CM

## 2018-11-17 DIAGNOSIS — D509 Iron deficiency anemia, unspecified: Secondary | ICD-10-CM | POA: Diagnosis not present

## 2018-11-17 DIAGNOSIS — N179 Acute kidney failure, unspecified: Secondary | ICD-10-CM | POA: Diagnosis not present

## 2018-11-17 NOTE — Progress Notes (Signed)
I spoke with patient by phone. She reports that her abdominal distension is worsening. She denies n/v/d. No constipation. No fevers, chills. Previous CT showed interval progression of ascites. Patient would like to pursue paracentesis.   Case and plan discussed with Dr. Grayland Ormond.

## 2018-11-17 NOTE — Telephone Encounter (Signed)
Paracentesis schedule request faxed (see Jessica Chang, NP progress note).  Patient requesting to have done this week before Thursday @ 5 because she is wanting to go to beach written on form.

## 2018-11-17 NOTE — Telephone Encounter (Signed)
Paracentesis scheduled for 11/18/2018 arrive @ 12:30 for 1:00 appt.  Patient is aware.

## 2018-11-18 ENCOUNTER — Other Ambulatory Visit: Payer: Self-pay

## 2018-11-18 ENCOUNTER — Ambulatory Visit
Admission: RE | Admit: 2018-11-18 | Discharge: 2018-11-18 | Disposition: A | Discharge status: Home / Self Care | Payer: PPO | Source: Ambulatory Visit | Attending: Hospice and Palliative Medicine | Admitting: Hospice and Palliative Medicine

## 2018-11-18 DIAGNOSIS — C569 Malignant neoplasm of unspecified ovary: Secondary | ICD-10-CM | POA: Insufficient documentation

## 2018-11-18 DIAGNOSIS — R188 Other ascites: Secondary | ICD-10-CM | POA: Diagnosis not present

## 2018-11-18 NOTE — Procedures (Signed)
US paracentesis without difficulty  Complications:  None  Blood Loss: none  See dictation in canopy pacs  

## 2018-11-21 DIAGNOSIS — I509 Heart failure, unspecified: Secondary | ICD-10-CM | POA: Diagnosis not present

## 2018-11-26 LAB — ACID FAST CULTURE WITH REFLEXED SENSITIVITIES (MYCOBACTERIA): Acid Fast Culture: NEGATIVE

## 2018-11-26 NOTE — Progress Notes (Signed)
Patient ID: Jessica Burgess, female    DOB: 1955/12/11, 63 y.o.   MRN: [id number]  HPI  Jessica Burgess is a 63 y/o female with a history of asthma, ovarian cancer, HTN, CKD, hyperlipidemia, anxiety, migraine, previous tobacco use and chronic heart failure.   Echo report from 08/28/2018 reviewed and showed an EF of 50-55% along with moderate MR/TR, mild/ moderate AR and severely elevated PA pressure of 73.7 mmHg.  Admitted 10/08/2018 due to heart failure. Nephrology and oncology consult obtained. Given antibiotics due to patient's immunocompromised status. Patient had right and left thoracentesis done. Medications were adjusted. Discharged after 6 days. Was in the ED 09/20/2018 due to gastroparesis where she was treated and released.   She presents today for a follow-up visit with a chief complaint of moderate fatigue upon minimal exertion. She describes this as chronic in nature having been present for several years. She has associated shortness of breath, intermittent chest pain, abdominal distention/ pain, light-headedness and anxiety along with this. She denies palpitations, pedal edema, cough or weight gain.   She had a paracentesis done on 11/18/2018 with removal of 2.1L of fluid removed. She says that her abdomen is more distended than when she had that one done.   Past Medical History:  Diagnosis Date  . Allergic rhinitis, cause unspecified   . Anxiety state, unspecified   . Arthritis   . Asthma    only when sick   . Backache, unspecified   . Bronchitis    hx of when get sick  . Cancer ([place])    skin cancer , basal cell   . Cancer ([place]) 11/2016   ovarian  . Cervicalgia   . CHF (congestive heart failure) ([place])   . Complication of anesthesia   . Dermatophytosis of nail   . Dysmetabolic syndrome X   . Encounter for long-term (current) use of other medications   . Esophageal reflux   . Hypertension   . Insomnia, unspecified   . Leukocytosis, unspecified   . Migraine without aura,  without mention of intractable migraine without mention of status migrainosus   . Other and unspecified hyperlipidemia   . Other malaise and fatigue   . Overweight(278.02)   . Personal history of chemotherapy now   ovarian  . Renal insufficiency   . Spinal stenosis in cervical region   . Symptomatic menopausal or female climacteric states   . Unspecified disorder of skin and subcutaneous tissue   . Unspecified vitamin D deficiency    Past Surgical History:  Procedure Laterality Date  . ABDOMINAL HYSTERECTOMY    . ANTERIOR CERVICAL DECOMP/DISCECTOMY FUSION N/A 06/07/2015   Procedure: Cervical three - four and Cervical six- seven anterior cervical decompression with fusion interbody prosthesis plating and bonegraft;  Surgeon: Newman Pies, MD;  Location: [place] [facility];  Service: Neurosurgery;  Laterality: N/A;  C34 and C67 anterior cervical decompression with fusion interbody prosthesis plating and bonegraft  . BACK SURGERY     x2 Lower   . DIALYSIS/PERMA CATHETER INSERTION N/A 08/17/2018   Procedure: DIALYSIS/PERMA CATHETER INSERTION;  Surgeon: Algernon Huxley, MD;  Location: [facility];  Service: Cardiovascular;  Laterality: N/A;  . EVACUATION OF CERVICAL HEMATOMA N/A 06/14/2015   Procedure: EVACUATION OF CERVICAL HEMATOMA;  Surgeon: Newman Pies, MD;  Location: [place] [facility];  Service: Neurosurgery;  Laterality: N/A;  . NECK SURGERY     x3  . TONSILLECTOMY     Family History  Problem Relation Age of Onset  .  Depression Mother   . Migraines Mother   . Dementia Father   . Diabetes Father   . Hyperlipidemia Father   . Hyperlipidemia Brother   . Hyperlipidemia Brother   . Breast cancer Paternal Aunt        40s   Social History   Tobacco Use  . Smoking status: Former Smoker    Packs/day: 1.50    Years: 20.00    Pack years: 30.00    Types: Cigarettes    Start date: 03/19/1979    Quit date: 08/25/1999    Years since quitting: 19.2  . Smokeless tobacco: Never Used  .  Tobacco comment: smoking cessation materials not required  Substance Use Topics  . Alcohol use: Not Currently    Alcohol/week: 0.0 standard drinks   No Known Allergies Prior to Admission medications   Medication Sig Start Date End Date Taking? Authorizing Provider  albuterol (PROVENTIL HFA;VENTOLIN HFA) 108 (90 Base) MCG/ACT inhaler Inhale 2 puffs into the lungs every 6 (six) hours as needed for wheezing or shortness of breath. 10/04/16  Yes Hubbard Hartshorn, FNP  albuterol (PROVENTIL) (2.5 MG/3ML) 0.083% nebulizer solution Take 3 mLs (2.5 mg total) by nebulization every 6 (six) hours as needed for wheezing or shortness of breath. 08/18/18  Yes Sowles, Drue Stager, MD  ALPRAZolam Duanne Moron) 0.25 MG tablet Take 1 tablet (0.25 mg total) by mouth at bedtime as needed for anxiety. 10/20/18  Yes Lloyd Huger, MD  amiodarone (PACERONE) 200 MG tablet Take 1 tablet (200 mg total) by mouth daily. 10/28/18  Yes End, Harrell Gave, MD  apixaban (ELIQUIS) 5 MG TABS tablet Take 1 tablet (5 mg total) by mouth 2 (two) times daily. 10/14/18 11/27/18 Yes Pyreddy, Reatha Harps, MD  cetirizine (ZYRTEC) 10 MG tablet Take 10 mg by mouth daily.   Yes [provider]  Cholecalciferol (VITAMIN D-1000 MAX ST) 1000 UNITS tablet Take 1,000 Units by mouth 2 (two) times daily.    Yes [provider]  cyclobenzaprine (FLEXERIL) 10 MG tablet Take 1 tablet (10 mg total) by mouth every 8 (eight) hours as needed for muscle spasms. 10/20/18  Yes Lloyd Huger, MD  DULoxetine (CYMBALTA) 60 MG capsule Take 1 capsule (60 mg total) by mouth daily. 09/16/18  Yes Sowles, Drue Stager, MD  ferrous sulfate (IRON SUPPLEMENT) 325 (65 FE) MG tablet Take 1 tablet by mouth daily.   Yes [provider]  fluticasone (FLONASE) 50 MCG/ACT nasal spray Place 2 sprays into both nostrils as needed. 06/11/18 11/27/18 Yes Sowles, Drue Stager, MD  hydrALAZINE (APRESOLINE) 25 MG tablet Take 1 tablet (25 mg total) by mouth 3 (three) times daily. For BP if  above 150/90 09/28/18  Yes Sowles, Drue Stager, MD  hydrOXYzine (ATARAX/VISTARIL) 10 MG tablet Take 10 mg by mouth 3 (three) times daily as needed.   Yes [provider]  ipratropium (ATROVENT HFA) 17 MCG/ACT inhaler Inhale 2 puffs into the lungs every 6 (six) hours as needed for wheezing.    Yes [provider]  isosorbide mononitrate (IMDUR) 30 MG 24 hr tablet Take 1 tablet (30 mg total) by mouth daily. 08/18/18  Yes Gladstone Lighter, MD  levothyroxine (SYNTHROID) 25 MCG tablet Take 1 tablet (25 mcg total) by mouth daily before breakfast. 07/21/18  Yes Borders, Kirt Boys, NP  lidocaine (XYLOCAINE) 2 % solution Use as directed 15 mLs in the mouth or throat as needed for mouth pain. 11/28/17  Yes Jacquelin Hawking, NP  losartan (COZAAR) 100 MG tablet Take 1 tablet (100  mg total) by mouth daily. 10/15/18 11/27/18 Yes Pyreddy, Pavan, MD  Lysine 1000 MG TABS Take 1,000 mg by mouth daily.    Yes [provider]  Magnesium Oxide 400 (240 Mg) MG TABS Take 400 mg by mouth 2 (two) times daily.    Yes [provider]  metoCLOPramide (REGLAN) 5 MG tablet Take 1 tablet (5 mg total) by mouth every 8 (eight) hours as needed for nausea or vomiting. 09/21/18 09/21/19 Yes Hinda Kehr, MD  metoprolol succinate (TOPROL-XL) 50 MG 24 hr tablet Take 1 tablet (50 mg total) by mouth daily. Take with or immediately following a meal. 10/28/18 01/26/19 Yes End, Harrell Gave, MD  mometasone-formoterol (DULERA) 200-5 MCG/ACT AERO Inhale 2 puffs into the lungs 2 (two) times daily.   Yes [provider]  Multiple Vitamins-Minerals (MULTIVITAMIN PO) Take 1 tablet by mouth daily.    Yes [provider]  Omega-3 Fatty Acids (FISH OIL) 1000 MG CAPS Take 1,000 capsules by mouth 2 (two) times daily.    Yes [provider]  ondansetron (ZOFRAN-ODT) 8 MG disintegrating tablet Take 1 tablet (8 mg total) by mouth every 8 (eight) hours as needed for nausea or vomiting. 07/20/18  Yes Borders,  Kirt Boys, NP  oxyCODONE-acetaminophen (PERCOCET) 10-325 MG tablet Take 1 tablet by mouth every 4 (four) hours as needed for pain. 11/11/18  Yes Borders, Kirt Boys, NP  pantoprazole (PROTONIX) 40 MG tablet Take 40 mg by mouth daily.   Yes [provider]  polyethylene glycol powder (GLYCOLAX/MIRALAX) 17 GM/SCOOP powder Take 17 g by mouth daily as needed for mild constipation or moderate constipation.    Yes [provider]  senna (SENOKOT) 8.6 MG TABS tablet Take 1 tablet (8.6 mg total) by mouth daily. 11/03/18  Yes Borders, Kirt Boys, NP  SUMAtriptan (IMITREX) 100 MG tablet May repeat in 2 hours if headache persists or recurs. Patient taking differently: Take 100 mg by mouth daily as needed for migraine. May repeat in 2 hours if headache persists or recurs. 09/16/18  Yes Sowles, Drue Stager, MD  temazepam (RESTORIL) 30 MG capsule Take 1 capsule (30 mg total) by mouth at bedtime as needed for sleep. 09/16/18  Yes Sowles, Drue Stager, MD  torsemide (DEMADEX) 20 MG tablet Take 2 tablets (40 mg total) by mouth 2 (two) times daily. 10/14/18 11/27/18 Yes Pyreddy, Reatha Harps, MD  vitamin B-12 (CYANOCOBALAMIN) 1000 MCG tablet Take 1,000 mcg by mouth daily.   Yes [provider]  vitamin C (ASCORBIC ACID) 500 MG tablet Take 500 mg by mouth daily.   Yes [provider]  prochlorperazine (COMPAZINE) 10 MG tablet Take 1 tablet (10 mg total) by mouth every 6 (six) hours as needed (Nausea or vomiting). 12/02/16 08/08/17  Lloyd Huger, MD    Review of Systems  Constitutional: Positive for appetite change (decreased) and fatigue (easily).  HENT: Positive for rhinorrhea. Negative for congestion and sore throat.   Eyes: Negative.   Respiratory: Positive for shortness of breath. Negative for cough.   Cardiovascular: Positive for chest pain. Negative for palpitations and leg swelling.  Gastrointestinal: Positive for abdominal distention and abdominal pain (due to swelling).  Endocrine: Negative.    Genitourinary: Negative.   Musculoskeletal: Negative for back pain.  Skin: Negative.   Allergic/Immunologic: Negative.   Neurological: Positive for light-headedness.  Hematological: Negative for adenopathy. Does not bruise/bleed easily.  Psychiatric/Behavioral: Negative for sleep disturbance. The patient is nervous/anxious.     Vitals:   11/27/18 1321  BP: 131/81  Pulse:  71  Resp: 18  SpO2: 98%  Weight: 150 lb 2 oz (68.1 kg)  Height: 5\' 2"  (1.575 m)   Wt Readings from Last 3 Encounters:  11/27/18 150 lb 2 oz (68.1 kg)  11/11/18 149 lb 12.8 oz (67.9 kg)  11/03/18 148 lb (67.1 kg)   Lab Results  Component Value Date   CREATININE 2.16 (H) 11/03/2018   CREATININE 2.13 (H) 10/27/2018   CREATININE 3.79 (H) 10/19/2018    Physical Exam Constitutional:      Appearance: She is well-developed.  HENT:     Head: Normocephalic and atraumatic.  Cardiovascular:     Rate and Rhythm: Normal rate and regular rhythm.  Pulmonary:     Effort: Pulmonary effort is normal. No respiratory distress.     Breath sounds: No wheezing or rales.  Abdominal:     General: There is distension (taut from umbilicus and up/ softer below umbilicus).     Tenderness: There is abdominal tenderness.  Musculoskeletal:     Right lower leg: She exhibits no tenderness. No edema.     Left lower leg: She exhibits no tenderness. No edema.  Skin:    General: Skin is warm and dry.  Neurological:     General: No focal deficit present.     Mental Status: She is alert and oriented to person, place, and time.  Psychiatric:        Mood and Affect: Mood is anxious.        Behavior: Behavior normal.     ASSESSMENT & PLAN:    1. Chronic heart failure with mildly reduced ejection fraction- - NYHA class III - euvolemic - weighing daily and says that her weight has been fairly stable - reminded to call for an overnight weight gain of >2 pounds or a weekly weight gain of >5 pounds - not adding salt to her food and  has been reading food labels for sodium content - wearing oxygen at 3L at bedtime  - saw cardiology (End) 10/28/2018 - BNP 10/08/2018 was 1214.0  2: ESRD on dialysis-   - receiving dialysis three days/ week - saw PCP Ancil Boozer) 10/23/2018 - BMP 11/03/2018 reviewed and showed sodium 137, potassium 4.1, creatinine 2.16 and GFR 24  3: Lymphedema- - resolved  4: Terminal ovarian cancer with malignant ascites- - had US paracentesis done 11/18/2018 with removal of 2.1 L - saw oncology Grayland Ormond) 11/11/2018 - palliative care consult done 11/11/2018 - will send patient for US paracentesis today  Patient did not bring her medications nor a list. Each medication was verbally reviewed with the patient and she was encouraged to bring the bottles to every visit to confirm accuracy of list.  Due to her worsening ovarian cancer to the extent that she has chosen to stop further treatment for the cancer, she opts to not make a return appointment at this time. Advised her that she could call back at anytime for another appointment.

## 2018-11-27 ENCOUNTER — Ambulatory Visit: Payer: PPO | Admitting: Family

## 2018-11-27 ENCOUNTER — Other Ambulatory Visit: Payer: Self-pay | Admitting: Oncology

## 2018-11-27 ENCOUNTER — Ambulatory Visit
Admission: RE | Admit: 2018-11-27 | Discharge: 2018-11-27 | Disposition: A | Discharge status: Home / Self Care | Payer: PPO | Source: Ambulatory Visit | Attending: Family | Admitting: Family

## 2018-11-27 ENCOUNTER — Encounter: Payer: Self-pay | Admitting: Family

## 2018-11-27 ENCOUNTER — Other Ambulatory Visit: Payer: Self-pay

## 2018-11-27 VITALS — BP 131/81 | HR 71 | Resp 18 | Ht 62.0 in | Wt 150.1 lb

## 2018-11-27 DIAGNOSIS — R188 Other ascites: Secondary | ICD-10-CM

## 2018-11-27 DIAGNOSIS — N186 End stage renal disease: Secondary | ICD-10-CM

## 2018-11-27 DIAGNOSIS — C569 Malignant neoplasm of unspecified ovary: Secondary | ICD-10-CM

## 2018-11-27 DIAGNOSIS — I890 Lymphedema, not elsewhere classified: Secondary | ICD-10-CM

## 2018-11-27 DIAGNOSIS — I5022 Chronic systolic (congestive) heart failure: Secondary | ICD-10-CM

## 2018-11-27 NOTE — Procedures (Signed)
Pre Procedural Dx: Symptomatic Ascites Post Procedural Dx: Same  Successful US guided paracentesis yielding 3 L of serous ascitic fluid.  EBL: None  Complications: None immediate  Jay Catera Hankins, MD Pager #: [phone]   

## 2018-11-27 NOTE — Patient Instructions (Signed)
Continue weighing daily and call for an overnight weight gain of > 2 pounds or a weekly weight gain of >5 pounds. 

## 2018-12-03 ENCOUNTER — Other Ambulatory Visit: Payer: Self-pay | Admitting: *Deleted

## 2018-12-03 DIAGNOSIS — C569 Malignant neoplasm of unspecified ovary: Secondary | ICD-10-CM

## 2018-12-03 MED ORDER — CYCLOBENZAPRINE HCL 10 MG PO TABS: 10.0000 mg | ORAL_TABLET | Freq: Three times a day (TID) | ORAL | 1 refills | Status: DC | PRN

## 2018-12-03 MED ORDER — LEVOTHYROXINE SODIUM 25 MCG PO TABS: 25.0000 ug | ORAL_TABLET | Freq: Every day | ORAL | 2 refills | Status: AC

## 2018-12-04 ENCOUNTER — Other Ambulatory Visit: Payer: Self-pay | Admitting: Emergency Medicine

## 2018-12-04 ENCOUNTER — Encounter: Payer: Self-pay | Admitting: Oncology

## 2018-12-04 ENCOUNTER — Other Ambulatory Visit: Payer: Self-pay | Admitting: Family Medicine

## 2018-12-04 DIAGNOSIS — I10 Essential (primary) hypertension: Secondary | ICD-10-CM

## 2018-12-04 MED ORDER — TORSEMIDE 20 MG PO TABS: 40.0000 mg | ORAL_TABLET | Freq: Two times a day (BID) | ORAL | 0 refills | Status: DC

## 2018-12-04 MED ORDER — HYDRALAZINE HCL 25 MG PO TABS: 25.0000 mg | ORAL_TABLET | Freq: Three times a day (TID) | ORAL | 0 refills | Status: AC

## 2018-12-04 MED ORDER — METOPROLOL SUCCINATE ER 50 MG PO TB24: 50.0000 mg | ORAL_TABLET | Freq: Every day | ORAL | 1 refills | Status: AC

## 2018-12-04 MED ORDER — ISOSORBIDE MONONITRATE ER 30 MG PO TB24: 30.0000 mg | ORAL_TABLET | Freq: Every day | ORAL | 1 refills | Status: AC

## 2018-12-04 NOTE — Telephone Encounter (Signed)
Patient cam by office and left a list of refills that she stated that she was out of. Patient stated that they were prescribed by hospitalist

## 2018-12-08 ENCOUNTER — Other Ambulatory Visit: Payer: Self-pay

## 2018-12-09 ENCOUNTER — Other Ambulatory Visit: Payer: Self-pay

## 2018-12-09 ENCOUNTER — Encounter: Payer: Self-pay | Admitting: Internal Medicine

## 2018-12-09 ENCOUNTER — Inpatient Hospital Stay: Payer: PPO | Attending: Oncology

## 2018-12-09 ENCOUNTER — Ambulatory Visit (INDEPENDENT_AMBULATORY_CARE_PROVIDER_SITE_OTHER): Payer: PPO | Admitting: Internal Medicine

## 2018-12-09 ENCOUNTER — Ambulatory Visit: Payer: PPO | Admitting: Nurse Practitioner

## 2018-12-09 VITALS — BP 122/70 | HR 57 | Ht 62.0 in | Wt 148.8 lb

## 2018-12-09 DIAGNOSIS — R188 Other ascites: Secondary | ICD-10-CM | POA: Diagnosis not present

## 2018-12-09 DIAGNOSIS — I272 Pulmonary hypertension, unspecified: Secondary | ICD-10-CM

## 2018-12-09 DIAGNOSIS — N186 End stage renal disease: Secondary | ICD-10-CM | POA: Diagnosis not present

## 2018-12-09 DIAGNOSIS — I480 Paroxysmal atrial fibrillation: Secondary | ICD-10-CM

## 2018-12-09 DIAGNOSIS — C569 Malignant neoplasm of unspecified ovary: Secondary | ICD-10-CM

## 2018-12-09 DIAGNOSIS — I50812 Chronic right heart failure: Secondary | ICD-10-CM

## 2018-12-09 DIAGNOSIS — Z992 Dependence on renal dialysis: Secondary | ICD-10-CM | POA: Diagnosis not present

## 2018-12-09 MED ORDER — APIXABAN 5 MG PO TABS: 5.0000 mg | ORAL_TABLET | Freq: Two times a day (BID) | ORAL | 2 refills | Status: AC

## 2018-12-09 NOTE — Progress Notes (Signed)
Follow-up Outpatient Visit Date: 12/09/2018  Primary Care Provider: Steele Sizer, [address] [place] [address]  Chief Complaint: Fatigue and abdominal swelling  HPI:  Jessica Burgess is a 63 y.o. year-old female with history of chronic HFpEF and severe pulmonary hypertension, paroxysmal atrial fibrillation, COPD, stage IIIC high grade serous ovarian cancer, and ESRD on hemodialysis, who presents for follow-up of heart failure and atrial fibrillation.  I last saw her on 10/28/2018, at which time Jessica Burgess complained of progressive weakness and fatigue.  She also had significant leg edema despite being on torsemide and remaining compliant with her hemodialysis.  We decided to decrease amiodarone and metoprolol to see if that would help improve her fatigue.  Paracentesis was performed on 11/18/2018 with removal of 2.1 L of fluid.  However, Jessica Burgess felt like her abdomen was even more distended compared to when paracentesis was performed when she saw Jessica Burgess on 11/27/2018.  She was sent for repeat paracentesis that day, yielding 3 L of fluid.  She saw Dr. Grayland Ormond on 11/11/2018, at which time she decided to forego any further oncologic therapy and to focus on palliative treatments.  However, she did not wish to discontinue hemodialysis yet or transition to full hospice care.  Today, Jessica Burgess reports that her energy is actually a little better than at our last visit.  The paracenteses have helped her feel much better, but she has noticed return of the abdominal distention.  She has mild exertional dyspnea, less pronounced than a month ago.  She has weaned herself off supplemental oxygen.  She denies chest pain, palpitations, and lightheadedness.  She reports that dialysis typically removes ~3 L per session.  Hypotension has precluded more aggressive fluid removal.  She denies bleeding.  She is planning to travel to [facility] next week if she is able to coordinate this  with her dialysis team.  --------------------------------------------------------------------------------------------------  Past Medical History:  Diagnosis Date   Allergic rhinitis, cause unspecified    Anxiety state, unspecified    Arthritis    Asthma    only when sick    Backache, unspecified    Bronchitis    hx of when get sick   Cancer [facility])    skin cancer , basal cell    Cancer ([place]) 11/2016   ovarian   Cervicalgia    CHF (congestive heart failure) (HCC)    Complication of anesthesia    Dermatophytosis of nail    Dysmetabolic syndrome X    Encounter for long-term (current) use of other medications    Esophageal reflux    Hypertension    Insomnia, unspecified    Leukocytosis, unspecified    Migraine without aura, without mention of intractable migraine without mention of status migrainosus    Other and unspecified hyperlipidemia    Other malaise and fatigue    Overweight(278.02)    Personal history of chemotherapy now   ovarian   Renal insufficiency    Spinal stenosis in cervical region    Symptomatic menopausal or female climacteric states    Unspecified disorder of skin and subcutaneous tissue    Unspecified vitamin D deficiency    Past Surgical History:  Procedure Laterality Date   ABDOMINAL HYSTERECTOMY     ANTERIOR CERVICAL DECOMP/DISCECTOMY FUSION N/A 06/07/2015   Procedure: Cervical three - four and Cervical six- seven anterior cervical decompression with fusion interbody prosthesis plating and bonegraft;  Surgeon: Newman Pies, MD;  Location: [place] [facility];  Service: Neurosurgery;  Laterality: N/A;  C34 and C67 anterior cervical decompression with fusion interbody prosthesis plating and bonegraft   BACK SURGERY     x2 Lower    DIALYSIS/PERMA CATHETER INSERTION N/A 08/17/2018   Procedure: DIALYSIS/PERMA CATHETER INSERTION;  Surgeon: Algernon Huxley, MD;  Location: [place] [facility];  Service: Cardiovascular;  Laterality: N/A;    EVACUATION OF CERVICAL HEMATOMA N/A 06/14/2015   Procedure: EVACUATION OF CERVICAL HEMATOMA;  Surgeon: Newman Pies, MD;  Location: [place] [facility];  Service: Neurosurgery;  Laterality: N/A;   NECK SURGERY     x3   TONSILLECTOMY      Current Meds  Medication Sig   albuterol (PROVENTIL HFA;VENTOLIN HFA) 108 (90 Base) MCG/ACT inhaler Inhale 2 puffs into the lungs every 6 (six) hours as needed for wheezing or shortness of breath.   albuterol (PROVENTIL) (2.5 MG/3ML) 0.083% nebulizer solution Take 3 mLs (2.5 mg total) by nebulization every 6 (six) hours as needed for wheezing or shortness of breath.   ALPRAZolam (XANAX) 0.25 MG tablet Take 1 tablet (0.25 mg total) by mouth at bedtime as needed for anxiety.   amiodarone (PACERONE) 200 MG tablet Take 1 tablet (200 mg total) by mouth daily.   cetirizine (ZYRTEC) 10 MG tablet Take 10 mg by mouth daily.   Cholecalciferol (VITAMIN D-1000 MAX ST) 1000 UNITS tablet Take 1,000 Units by mouth 2 (two) times daily.    cyclobenzaprine (FLEXERIL) 10 MG tablet Take 1 tablet (10 mg total) by mouth every 8 (eight) hours as needed.   DULoxetine (CYMBALTA) 60 MG capsule Take 1 capsule (60 mg total) by mouth daily.   ferrous sulfate (IRON SUPPLEMENT) 325 (65 FE) MG tablet Take 1 tablet by mouth daily.   hydrALAZINE (APRESOLINE) 25 MG tablet Take 1 tablet (25 mg total) by mouth 3 (three) times daily. For BP if above 150/90   hydrOXYzine (ATARAX/VISTARIL) 10 MG tablet Take 10 mg by mouth 3 (three) times daily as needed.   ipratropium (ATROVENT HFA) 17 MCG/ACT inhaler Inhale 2 puffs into the lungs every 6 (six) hours as needed for wheezing.    isosorbide mononitrate (IMDUR) 30 MG 24 hr tablet Take 1 tablet (30 mg total) by mouth daily.   levothyroxine (SYNTHROID) 25 MCG tablet Take 1 tablet (25 mcg total) by mouth daily before breakfast.   lidocaine (XYLOCAINE) 2 % solution Use as directed 15 mLs in the mouth or throat as needed for mouth pain.    Lysine 1000 MG TABS Take 1,000 mg by mouth daily.    Magnesium Oxide 400 (240 Mg) MG TABS Take 400 mg by mouth 2 (two) times daily.    metoCLOPramide (REGLAN) 5 MG tablet Take 1 tablet (5 mg total) by mouth every 8 (eight) hours as needed for nausea or vomiting.   metoprolol succinate (TOPROL-XL) 50 MG 24 hr tablet Take 1 tablet (50 mg total) by mouth daily. Take with or immediately following a meal.   mometasone-formoterol (DULERA) 200-5 MCG/ACT AERO Inhale 2 puffs into the lungs 2 (two) times daily.   Multiple Vitamins-Minerals (MULTIVITAMIN PO) Take 1 tablet by mouth daily.    Omega-3 Fatty Acids (FISH OIL) 1000 MG CAPS Take 1,000 capsules by mouth 2 (two) times daily.    ondansetron (ZOFRAN-ODT) 8 MG disintegrating tablet Take 1 tablet (8 mg total) by mouth every 8 (eight) hours as needed for nausea or vomiting.   oxyCODONE-acetaminophen (PERCOCET) 10-325 MG tablet Take 1 tablet by mouth every 4 (four) hours as needed for pain.  pantoprazole (PROTONIX) 40 MG tablet Take 40 mg by mouth daily.   polyethylene glycol powder (GLYCOLAX/MIRALAX) 17 GM/SCOOP powder Take 17 g by mouth daily as needed for mild constipation or moderate constipation.    senna (SENOKOT) 8.6 MG TABS tablet Take 1 tablet (8.6 mg total) by mouth daily.   SUMAtriptan (IMITREX) 100 MG tablet May repeat in 2 hours if headache persists or recurs. (Patient taking differently: Take 100 mg by mouth daily as needed for migraine. May repeat in 2 hours if headache persists or recurs.)   temazepam (RESTORIL) 30 MG capsule Take 1 capsule (30 mg total) by mouth at bedtime as needed for sleep.   torsemide (DEMADEX) 20 MG tablet Take 2 tablets (40 mg total) by mouth 2 (two) times daily.   vitamin B-12 (CYANOCOBALAMIN) 1000 MCG tablet Take 1,000 mcg by mouth daily.   vitamin C (ASCORBIC ACID) 500 MG tablet Take 500 mg by mouth daily.    Allergies: Patient has no known allergies.  Social History   Tobacco Use   Smoking  status: Former Smoker    Packs/day: 1.50    Years: 20.00    Pack years: 30.00    Types: Cigarettes    Start date: 03/19/1979    Quit date: 08/25/1999    Years since quitting: 19.3   Smokeless tobacco: Never Used   Tobacco comment: smoking cessation materials not required  Substance Use Topics   Alcohol use: Not Currently    Alcohol/week: 0.0 standard drinks   Drug use: No    Family History  Problem Relation Age of Onset   Depression Mother    Migraines Mother    Dementia Father    Diabetes Father    Hyperlipidemia Father    Hyperlipidemia Brother    Hyperlipidemia Brother    Breast cancer Paternal Aunt        48s    Review of Systems: A 12-system review of systems was performed and was negative except as noted in the HPI.  --------------------------------------------------------------------------------------------------  Physical Exam: BP 122/70 (BP Location: Left Arm, Patient Position: Sitting, Cuff Size: Normal)    Pulse (!) 57    Ht 5\' 2"  (1.575 m)    Wt 148 lb 12 oz (67.5 kg)    SpO2 98%    BMI 27.21 kg/m   General:  NAD. HEENT: No conjunctival pallor or scleral icterus. Facemask in place. Neck: Supple without lymphadenopathy or thyromegaly.  JVP ~8 cm. Lungs: Normal work of breathing. Clear to auscultation bilaterally without wheezes or crackles. Heart: Regular rate and rhythm with 2/6 systolic murmur.  No rubs or gallops. Abd: Bowel sounds present. Tense abdomen with mild diffuse tenderness. Ext: Trace pretibial edema bilaterally. Skin: Warm and dry without rash.  EKG:  Sinus bradycardia (HR 57 bpm) with possible left atrial enlargement and poor R wave progression (question lead placement).  Lab Results  Component Value Date   WBC 6.5 11/03/2018   HGB 10.0 (L) 11/03/2018   HCT 31.7 (L) 11/03/2018   MCV 99.4 11/03/2018   PLT 253 11/03/2018    Lab Results  Component Value Date   NA 137 11/03/2018   K 4.1 11/03/2018   CL 97 (L) 11/03/2018    CO2 30 11/03/2018   BUN 15 11/03/2018   CREATININE 2.16 (H) 11/03/2018   GLUCOSE 111 (H) 11/03/2018   ALT 10 11/03/2018    Lab Results  Component Value Date   CHOL 131 05/17/2016   HDL 48 (L) 05/17/2016   LDLCALC 49  05/17/2016   TRIG 171 (H) 05/17/2016   CHOLHDL 2.7 05/17/2016    --------------------------------------------------------------------------------------------------  ASSESSMENT AND PLAN: Right heart failure and severe pulmonary hypertension: Other than recurrent ascites, Ms. Yeagle appears euvolemic.  She should continue her current dose of torsemide and hemodialysis.  No medication changes today.  Paroxysmal atrial fibrillation: Maintaining sinus rhythm.  We will continue amiodarone and apixaban.  Ascites: Abdomen is tense with progressive distention since last paracentesis on 11/27/2018.  We will arrange for ultrasound-guided paracentesis later this week.  I will also inquire about the possible of PleurX catheter placement in the abdomen so that Ms. Vandenheuvel can manage her ascites at home in the setting of her advanced ovarian cancer and right heart failure.  This is a palliative measure.  Ovarian cancer: Ms. Adinolfi declines further chemotherapy.  She is being followed by palliative care but does not wish to discontinue hemodialysis or enter hospice care at this time.  As above, we will repeat paracentesis and explore possibility of PleurX catheter placement.  It may be helpful to send fluid for cytology and chemistries to determine if this is primarily due to right heart failure or malignancy.  ESRD: Continue torsemide and hemodialysis, per nephrology.  Follow-up: RTC 1 month.  Jessica Bush, MD 12/10/2018 11:48 AM

## 2018-12-09 NOTE — Patient Instructions (Addendum)
Medication Instructions:  Your physician recommends that you continue on your current medications as directed. Please refer to the Current Medication list given to you today.  If you need a refill on your cardiac medications before your next appointment, please call your pharmacy.   Lab work: NONE If you have labs (blood work) drawn today and your tests are completely normal, you will receive your results only by: Marland Kitchen MyChart Message (if you have MyChart) OR . A paper copy in the mail If you have any lab test that is abnormal or we need to change your treatment, we will call you to review the results.  Testing/Procedures: You are scheduled for an Ultrasound Guided Paracentesis. DATE/TIME:  Friday, 12/11/18, ARRIVE AT 2 PM. LOCATION: [place]: At [facility], you and your health needs are our priority.  As part of our continuing mission to provide you with exceptional heart care, we have created designated Provider Care Teams.  These Care Teams include your primary Cardiologist (physician) and Advanced Practice Providers (APPs -  Physician Assistants and Nurse Practitioners) who all work together to provide you with the care you need, when you need it. You will need a follow up appointment in 1 months.  You may see Nelva Bush, MD or one of the following Advanced Practice Providers on your designated Care Team:   Murray Hodgkins, NP Christell Faith, PA-C . Marrianne Mood, PA-C    Paracentesis Paracentesis is a procedure to remove excess fluid from the abdomen. When a person collects too much fluid in the abdomen, it is called ascites. Ascites can result from certain conditions, such as chronic scarring of the liver (cirrhosis), cancer, heart failure, or infection. In paracentesis, the fluid is removed using a needle that is inserted through the skin and tissue into the abdomen. This procedure may be done to:  Find the cause of the ascites.  Relieve symptoms that  are caused by the ascites, such as pain or shortness of breath. Tell a health care provider about:  Any allergies you have.  All medicines you are taking, including vitamins, herbs, eye drops, creams, and over-the-counter medicines.  Any problems you or family members have had with anesthetic medicines.  Any blood disorders you have.  Any surgeries you have had.  Any medical conditions you have.  Whether you are pregnant or may be pregnant. What are the risks? Generally, this is a safe procedure. However, problems may occur, including:  Infection.  Bleeding.  Injury to an abdominal organ, such as the bowel (large intestine), liver, spleen, or bladder.  Low blood pressure (hypotension).  Mental status changes in people who have liver disease. These changes would be caused by shifts in the balance of fluids and minerals (electrolytes) in the body. What happens before the procedure?  Ask your health care provider about: ? Changing or stopping your regular medicines. This is especially important if you are taking diabetes medicines or blood thinners. ? Taking medicines such as aspirin and ibuprofen. These medicines can thin your blood. Do not take these medicines unless your health care provider tells you to take them. ? Taking over-the-counter medicines, vitamins, herbs, and supplements.  Ask your health care provider what steps will be taken to help prevent infection. These may include washing skin with a germ-killing soap.  A blood sample may be taken to determine your blood clotting time.  You may have imaging tests.  You will be asked to urinate. What happens during the procedure?  You may be asked to lie on your back with your head raised (elevated).  You will be given a medicine to numb the area (local anesthetic) where the needle will be inserted.  Your abdominal skin will be punctured with a needle.  A drainage tube will be connected to the needle. Fluid will  drain through the tube into a container.  After enough fluid has been removed, the needle will be removed.  A sample of the fluid will be sent for examination and testing.  A bandage (dressing) will be placed over the puncture site. The procedure may vary among health care providers and hospitals. What happens after the procedure? It is up to you to get the results of your procedure. Ask your health care provider, or the department that is doing the procedure, when your results will be ready. Summary  Paracentesis is a procedure to remove excess fluid from the abdomen using a needle.  This procedure may be done to find the cause of ascites or to help relieve your symptoms.  Your skin will be numbed with medicine (local anesthetic) before the needle is inserted.  A sample of the removed fluid will be sent for examination and testing. This information is not intended to replace advice given to you by your health care provider. Make sure you discuss any questions you have with your health care provider. Document Released: 09/17/2004 Document Revised: 02/03/2018 Document Reviewed: 12/23/2017 Elsevier Patient Education  2020 [employer].

## 2018-12-10 ENCOUNTER — Other Ambulatory Visit: Payer: Self-pay | Admitting: Family Medicine

## 2018-12-10 ENCOUNTER — Other Ambulatory Visit: Payer: Self-pay

## 2018-12-10 ENCOUNTER — Encounter: Payer: Self-pay | Admitting: Internal Medicine

## 2018-12-10 ENCOUNTER — Telehealth: Payer: Self-pay | Admitting: Family Medicine

## 2018-12-10 ENCOUNTER — Other Ambulatory Visit: Payer: Self-pay | Admitting: Oncology

## 2018-12-10 ENCOUNTER — Telehealth: Payer: Self-pay

## 2018-12-10 DIAGNOSIS — G4700 Insomnia, unspecified: Secondary | ICD-10-CM

## 2018-12-10 DIAGNOSIS — C569 Malignant neoplasm of unspecified ovary: Secondary | ICD-10-CM

## 2018-12-10 DIAGNOSIS — Z1159 Encounter for screening for other viral diseases: Secondary | ICD-10-CM

## 2018-12-10 LAB — CA 125: Cancer Antigen (CA) 125: 1150 U/mL — ABNORMAL HIGH (ref 0.0–38.1)

## 2018-12-10 NOTE — Telephone Encounter (Signed)
Pt is needing a note saying that she does NOT HAVE COVID 19. [place] thru Dec 20 2018 or they will not do her dialysis until they have that note.

## 2018-12-10 NOTE — Telephone Encounter (Signed)
[facility] called and said that to cancel the covid test that they were taking care of all that she needs. Thanks

## 2018-12-10 NOTE — Telephone Encounter (Signed)
Ok, this will be forwarded to Dr. Ancil Boozer.

## 2018-12-10 NOTE — Telephone Encounter (Signed)
-----   Message from Nelva Bush, MD sent at 12/10/2018  1:24 PM EDT ----- Regarding: Fluid labs Hello,   Ms. Soman is due for a paracentesis with radiology tomorrow.  I have tried order labs to sign and hold as I would for a cath but the system keeps auto-cancelling them.  Could you help place orders for the following labs to be collected tomorrow during her paracentesis?  Serum albumin (needs a blood draw for this) Fluid albumin Fluid total protein Fluid cell count with differential Fluid cytology  Please let me know if any issues come up.  Thanks for your help.  Gerald Stabs

## 2018-12-10 NOTE — Telephone Encounter (Signed)
Requested medication (s) are due for refill today: yes  Requested medication (s) are on the active medication list:yes    Last refill: 09/16/2018  #30 0 refills  Future visit scheduled yes  Notes to clinic:not delegated  Requested Prescriptions  Pending Prescriptions Disp Refills   temazepam (RESTORIL) 30 MG capsule [Pharmacy Med Name: TEMAZEPAM 30MG  CAPSULES] 30 capsule     Sig: TAKE 1 CAPSULE(30 MG) BY MOUTH AT BEDTIME AS NEEDED FOR SLEEP     Not Delegated - Psychiatry:  Anxiolytics/Hypnotics Failed - 12/10/2018  5:21 PM      Failed - This refill cannot be delegated      Failed - Urine Drug Screen completed in last 360 days.      Passed - Valid encounter within last 6 months    Recent Outpatient Visits          1 month ago Hemodialysis patient [facility])   [facility] Steele Sizer, MD   2 months ago Uncontrolled hypertension   [facility] Steele Sizer, MD   2 months ago Hemodialysis patient [facility])   [facility] Steele Sizer, MD   3 months ago CHF (congestive heart failure), NYHA class IV, chronic, systolic [facility])   [facility] Steele Sizer, MD   6 months ago Left lumbar radiculitis   [facility] Steele Sizer, MD      Future Appointments            In 1 week Steele Sizer, MD [facility], [place]   In 4 weeks End, Harrell Gave, MD [facility], [facility]   In 9 months  [facility], [facility]

## 2018-12-10 NOTE — Telephone Encounter (Signed)
Pt notified and spoke with Baxter Kail about this

## 2018-12-11 ENCOUNTER — Other Ambulatory Visit: Payer: Self-pay | Admitting: *Deleted

## 2018-12-11 ENCOUNTER — Other Ambulatory Visit: Payer: Self-pay

## 2018-12-11 ENCOUNTER — Ambulatory Visit
Admission: RE | Admit: 2018-12-11 | Discharge: 2018-12-11 | Disposition: A | Discharge status: Home / Self Care | Payer: PPO | Source: Ambulatory Visit | Attending: Internal Medicine | Admitting: Internal Medicine

## 2018-12-11 DIAGNOSIS — M5441 Lumbago with sciatica, right side: Secondary | ICD-10-CM

## 2018-12-11 DIAGNOSIS — G8929 Other chronic pain: Secondary | ICD-10-CM

## 2018-12-11 DIAGNOSIS — R188 Other ascites: Secondary | ICD-10-CM | POA: Diagnosis not present

## 2018-12-11 DIAGNOSIS — C569 Malignant neoplasm of unspecified ovary: Secondary | ICD-10-CM | POA: Diagnosis not present

## 2018-12-11 LAB — BODY FLUID CELL COUNT WITH DIFFERENTIAL
Eos, Fluid: 0 %
Lymphs, Fluid: 17 %
Monocyte-Macrophage-Serous Fluid: 82 %
Neutrophil Count, Fluid: 0 %
Other Cells, Fluid: 1 %
Total Nucleated Cell Count, Fluid: 832 cu mm

## 2018-12-11 LAB — ALBUMIN, PLEURAL OR PERITONEAL FLUID: Albumin, Fluid: 2 g/dL

## 2018-12-11 LAB — PROTEIN, PLEURAL OR PERITONEAL FLUID: Total protein, fluid: 4 g/dL

## 2018-12-11 MED ORDER — OXYCODONE-ACETAMINOPHEN 10-325 MG PO TABS: 1.0000 | ORAL_TABLET | ORAL | 0 refills | Status: DC | PRN

## 2018-12-11 NOTE — Procedures (Signed)
Interventional Radiology Procedure:   Indications: Recurrent ascites.  Procedure: US guided paracentesis  Findings: Removed 2600 ml from RLQ quadrant  Complications: None     EBL: Less than 10 ml  Procedure performed by Ascencion Dike, PA-C.   Kolette Vey R. Anselm Pancoast, MD  Pager: [phone]

## 2018-12-11 NOTE — Telephone Encounter (Signed)
Patient called requesting prescription be sent in for her Oxycodone 10/325 mg tabs,she states she is leaving to go out of town Sunday and needs his sent  In to [facility] at [address] and [address] today.

## 2018-12-14 ENCOUNTER — Encounter: Payer: Self-pay | Admitting: Oncology

## 2018-12-15 DIAGNOSIS — Z992 Dependence on renal dialysis: Secondary | ICD-10-CM | POA: Diagnosis not present

## 2018-12-15 DIAGNOSIS — D631 Anemia in chronic kidney disease: Secondary | ICD-10-CM | POA: Diagnosis not present

## 2018-12-15 DIAGNOSIS — N186 End stage renal disease: Secondary | ICD-10-CM | POA: Diagnosis not present

## 2018-12-15 LAB — CYTOLOGY - NON PAP

## 2018-12-16 ENCOUNTER — Other Ambulatory Visit: Payer: Self-pay | Admitting: *Deleted

## 2018-12-16 ENCOUNTER — Inpatient Hospital Stay (HOSPITAL_BASED_OUTPATIENT_CLINIC_OR_DEPARTMENT_OTHER): Payer: PPO | Admitting: Hospice and Palliative Medicine

## 2018-12-16 DIAGNOSIS — C569 Malignant neoplasm of unspecified ovary: Secondary | ICD-10-CM

## 2018-12-16 DIAGNOSIS — G4700 Insomnia, unspecified: Secondary | ICD-10-CM | POA: Diagnosis not present

## 2018-12-16 DIAGNOSIS — Z9981 Dependence on supplemental oxygen: Secondary | ICD-10-CM | POA: Diagnosis not present

## 2018-12-16 DIAGNOSIS — R180 Malignant ascites: Secondary | ICD-10-CM

## 2018-12-16 DIAGNOSIS — Z515 Encounter for palliative care: Secondary | ICD-10-CM | POA: Diagnosis not present

## 2018-12-16 DIAGNOSIS — N186 End stage renal disease: Secondary | ICD-10-CM

## 2018-12-16 DIAGNOSIS — R188 Other ascites: Secondary | ICD-10-CM

## 2018-12-16 DIAGNOSIS — Z992 Dependence on renal dialysis: Secondary | ICD-10-CM | POA: Diagnosis not present

## 2018-12-16 MED ORDER — TEMAZEPAM 30 MG PO CAPS: 30.0000 mg | ORAL_CAPSULE | Freq: Every evening | ORAL | 1 refills | Status: AC | PRN

## 2018-12-16 NOTE — Progress Notes (Signed)
Virtual Visit via Telephone Note  I connected with Jessica Burgess on 12/16/18 at  2:00 PM EDT by telephone and verified that I am speaking with the correct person using two identifiers.   I discussed the limitations, risks, security and privacy concerns of performing an evaluation and management service by telephone and the availability of in person appointments. I also discussed with the patient that there may be a patient responsible charge related to this service. The patient expressed understanding and agreed to proceed.   History of Present Illness: Palliative Care consult requested for this62 y.o.femalewith multiple medical problems including stage IIIc high-grade serous ovarian carcinoma.She is status post debulking surgery at [facility] in 2018 and received one preoperative dose of Keytruda as part of a clinical trial. Patient completed adjuvant carboplatinum and Taxol, which was discontinued secondary to worsening neuropathy. Patient was also tried on Avastin and doxorubicin due to disease progression but was ultimately switched to gemcitabine secondary to declining performance status. She has been on treatment with gemcitabine since December 2019.   PMH is also notable for ESRD recently started on dialysis and severe diastolic dysfunction with recurrent CHF and severe pulmonary HTN.  Patient was recently hospitalized 08/28/2018 to 08/30/2018 with CHF.  Patient was referred to palliative care to help address symptoms and goals.    Observations/Objective: Patient reports that she is doing reasonably well. [place] the beach [place]. She denies acute changes or concerns. She had recent paracentesis, which seem to be occurring with increasing regularity.   Pain is stable. She denies other distressing symptoms.   Assessment and Plan:  High grade serous ovarian cancer - currently with plan for surveillance. Patient has previously opted to forgo future chemotherapy. CA  125 continues to rise.  Was 779 on 8/21 and 1150 on 9/23.  Patient asked about resuming chemotherapy but then said that she did not want to take on the risk of increased symptoms associated with treatment.  She remains committed to surveillance and best supportive care.  Ascites - presumed malignant.  She is requiring large-volume paracenteses with increasing frequency. Last was on 12/11/18 with 2.6L of fluid removed. Patient says that her abdominal distension is leading to early satiety. We discussed insertion of an abdominal pleurx catheter and she would like to pursue that as an option. She verbalized understanding of risks including infection, leakage, etc. Will plan paracentesis on 10/5 with tentative abd pleurx insertion on 10/9 per IR.  Insomnia - will refill temazepam. She is tolerating it well.    [place] - patient continues to receive HD and says it is going well. She wants to continue dialysis and can not receive hospice at present time. Will likely benefit from hospice involvement if she reaches a point where dialysis is no longer tolerated.   Follow Up Instructions: Follow-up telephone visit in 3 to 4 weeks   I discussed the assessment and treatment plan with the patient. The patient was provided an opportunity to ask questions and all were answered. The patient agreed with the plan and demonstrated an understanding of the instructions.   The patient was advised to call back or seek an in-person evaluation if the symptoms worsen or if the condition fails to improve as anticipated.  I provided 8 minutes of non-face-to-face time during this encounter.   Irean Hong, NP

## 2018-12-17 DIAGNOSIS — Z992 Dependence on renal dialysis: Secondary | ICD-10-CM | POA: Diagnosis not present

## 2018-12-17 DIAGNOSIS — D631 Anemia in chronic kidney disease: Secondary | ICD-10-CM | POA: Diagnosis not present

## 2018-12-17 DIAGNOSIS — N186 End stage renal disease: Secondary | ICD-10-CM | POA: Diagnosis not present

## 2018-12-21 ENCOUNTER — Ambulatory Visit
Admission: RE | Admit: 2018-12-21 | Discharge: 2018-12-21 | Disposition: A | Discharge status: Home / Self Care | Payer: PPO | Source: Ambulatory Visit | Attending: Hospice and Palliative Medicine | Admitting: Hospice and Palliative Medicine

## 2018-12-21 ENCOUNTER — Other Ambulatory Visit: Payer: Self-pay

## 2018-12-21 DIAGNOSIS — R188 Other ascites: Secondary | ICD-10-CM

## 2018-12-21 DIAGNOSIS — C569 Malignant neoplasm of unspecified ovary: Secondary | ICD-10-CM | POA: Diagnosis not present

## 2018-12-21 DIAGNOSIS — I509 Heart failure, unspecified: Secondary | ICD-10-CM | POA: Diagnosis not present

## 2018-12-22 ENCOUNTER — Ambulatory Visit: Payer: PPO

## 2018-12-22 DIAGNOSIS — Z992 Dependence on renal dialysis: Secondary | ICD-10-CM | POA: Diagnosis not present

## 2018-12-22 DIAGNOSIS — D631 Anemia in chronic kidney disease: Secondary | ICD-10-CM | POA: Diagnosis not present

## 2018-12-22 DIAGNOSIS — N186 End stage renal disease: Secondary | ICD-10-CM | POA: Diagnosis not present

## 2018-12-22 DIAGNOSIS — N184 Chronic kidney disease, stage 4 (severe): Secondary | ICD-10-CM | POA: Diagnosis not present

## 2018-12-22 DIAGNOSIS — D509 Iron deficiency anemia, unspecified: Secondary | ICD-10-CM | POA: Diagnosis not present

## 2018-12-22 DIAGNOSIS — N179 Acute kidney failure, unspecified: Secondary | ICD-10-CM | POA: Diagnosis not present

## 2018-12-23 ENCOUNTER — Other Ambulatory Visit: Payer: Self-pay

## 2018-12-23 ENCOUNTER — Ambulatory Visit (INDEPENDENT_AMBULATORY_CARE_PROVIDER_SITE_OTHER): Payer: PPO | Admitting: Family Medicine

## 2018-12-23 ENCOUNTER — Encounter: Payer: Self-pay | Admitting: Family Medicine

## 2018-12-23 VITALS — BP 120/70 | HR 54 | Temp 97.3°F | Resp 16 | Ht 62.0 in | Wt 144.3 lb

## 2018-12-23 DIAGNOSIS — Z23 Encounter for immunization: Secondary | ICD-10-CM | POA: Diagnosis not present

## 2018-12-23 DIAGNOSIS — C569 Malignant neoplasm of unspecified ovary: Secondary | ICD-10-CM

## 2018-12-23 DIAGNOSIS — F329 Major depressive disorder, single episode, unspecified: Secondary | ICD-10-CM

## 2018-12-23 DIAGNOSIS — E032 Hypothyroidism due to medicaments and other exogenous substances: Secondary | ICD-10-CM | POA: Diagnosis not present

## 2018-12-23 DIAGNOSIS — I5022 Chronic systolic (congestive) heart failure: Secondary | ICD-10-CM | POA: Diagnosis not present

## 2018-12-23 DIAGNOSIS — Z992 Dependence on renal dialysis: Secondary | ICD-10-CM | POA: Diagnosis not present

## 2018-12-23 DIAGNOSIS — G43009 Migraine without aura, not intractable, without status migrainosus: Secondary | ICD-10-CM | POA: Diagnosis not present

## 2018-12-23 MED ORDER — TORSEMIDE 20 MG PO TABS: 40.0000 mg | ORAL_TABLET | Freq: Two times a day (BID) | ORAL | 0 refills | Status: DC

## 2018-12-23 MED ORDER — DULOXETINE HCL 60 MG PO CPEP: 60.0000 mg | ORAL_CAPSULE | Freq: Every day | ORAL | 0 refills | Status: AC

## 2018-12-23 MED ORDER — SUMATRIPTAN SUCCINATE 100 MG PO TABS: 100.0000 mg | ORAL_TABLET | Freq: Every day | ORAL | 2 refills | Status: AC | PRN

## 2018-12-23 NOTE — Progress Notes (Signed)
Name: Jessica Burgess   MRN: [id number]    DOB: 05-01-1955   Date:12/23/2018       Progress Note  Subjective  Chief Complaint  Chief Complaint  Patient presents with  . Depression  . Hypertension  . Back Pain    HPI  HTN: bp was high when she arrived but back to normal after rest.  She states also normal during HD. She has continuous SOB, no chest pain at this time  CKI Stage V: last GFR 12 back in August -cannot see records from HD center , she denies pruritis, it finally resolved. HD three times a week , T, T and Saturday   Anxiety: she is now on alprazolam, hydroxyzine and also temazepam at night, she has multiple co-morbidities and under palliative care. Depression still on duloxetine   Carcinomatosis : last chemotherapy session was June 2020, now under palliative care and since stopped the therapy, noticed more abdominal pain described as sore, aching and bloating, she also continues to have ascites and will have a port placed soon , last paracenteses was done 12/21/2018  CHF: she uses oxygen only during HD, she has been doing okay with oxygen, came in today without it, still unable to walk but no sob at rest . Very mild lower extremity edema She is still on high dose demadex bid. Denies orthopnea or PND   Patient Active Problem List   Diagnosis Date Noted  . PONV (postoperative nausea and vomiting)   . Acute on chronic heart failure with preserved ejection fraction (HFpEF) ([place])   . Paroxysmal atrial fibrillation (HCC)   . Sepsis ([place]) 10/08/2018  . CHF (congestive heart failure) ([place]) 08/28/2018  . Acute respiratory failure ([place])   . Anxiety 08/14/2018  . ESRD on dialysis ([place])   . Acute CHF (congestive heart failure) ([place]) 08/07/2018  . Acute CHF ([place]) 08/06/2018  . 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 Mucositis due to chemotherapy 09/02/2017  . Goals of care, counseling/discussion 08/08/2017  . Hypothyroidism due to medication 07/28/2017  . Genetic testing  12/18/2016  . Migraines 12/01/2016  . Postoperative seroma of subcutaneous tissue after non-dermatologic procedure 12/01/2016  . Transaminitis 12/01/2016  . Anemia associated with acute blood loss 11/20/2016  . Ovarian cancer, unspecified laterality ([place]) 11/12/2016  . 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 Examination of participant in clinical trial 11/01/2016  . Carcinomatosis ([place]) 11/01/2016  . Chronic right-sided low back pain with right-sided sciatica 02/02/2016  . Status post insertion of dialysis catheter ([place]) 08/01/2015  . Hematoma 06/14/2015  . Perennial allergic rhinitis 05/05/2015  . Generalized anxiety disorder 02/03/2015  . Back pain, chronic 08/25/2014  . Insomnia, persistent 08/25/2014  . Chronic cervical pain 08/25/2014  . Major depression, chronic ([place]) 08/25/2014  . Dyslipidemia 08/25/2014  . Gastro-esophageal reflux disease without esophagitis 08/25/2014  . H/O high risk medication treatment 08/25/2014  . Blood glucose elevated 08/25/2014  . Migraine without aura and without status migrainosus, not intractable 08/25/2014  . Climacteric 08/25/2014  . Dysmetabolic syndrome 40/98/1191  . Fungal infection of toenail 08/25/2014  . Obesity (BMI 30.0-34.9) 08/25/2014  . Vitamin D deficiency 08/25/2014  . Engages in travel abroad 08/25/2014  . Cervical radiculitis 07/23/2013  . Cervical disc disorder with radiculopathy 04/28/2013    Past Surgical History:  Procedure Laterality Date  . ABDOMINAL HYSTERECTOMY    . ANTERIOR CERVICAL DECOMP/DISCECTOMY FUSION N/A 06/07/2015   Procedure: Cervical three - four and Cervical six- seven anterior cervical  decompression with fusion interbody prosthesis plating and bonegraft;  Surgeon: Newman Pies, MD;  Location: [facility];  Service: Neurosurgery;  Laterality: N/A;  C34 and C67 anterior cervical decompression with fusion interbody prosthesis plating and bonegraft  . BACK SURGERY     x2  Lower   . DIALYSIS/PERMA CATHETER INSERTION N/A 08/17/2018   Procedure: DIALYSIS/PERMA CATHETER INSERTION;  Surgeon: Algernon Huxley, MD;  Location: [facility];  Service: Cardiovascular;  Laterality: N/A;  . EVACUATION OF CERVICAL HEMATOMA N/A 06/14/2015   Procedure: EVACUATION OF CERVICAL HEMATOMA;  Surgeon: Newman Pies, MD;  Location: [facility];  Service: Neurosurgery;  Laterality: N/A;  . NECK SURGERY     x3  . TONSILLECTOMY      Family History  Problem Relation Age of Onset  . Depression Mother   . Migraines Mother   . Dementia Father   . Diabetes Father   . Hyperlipidemia Father   . Hyperlipidemia Brother   . Hyperlipidemia Brother   . Breast cancer Paternal Aunt        28s    Social History   Socioeconomic History  . Marital status: Single    Spouse name: Not on file  . Number of children: 0  . Years of education: some college  . Highest education level: 12th grade  Occupational History    Employer: DISABLED  . Occupation: Disabled   Social Needs  . Financial resource strain: Somewhat hard  . Food insecurity    Worry: Never true    Inability: Never true  . Transportation needs    Medical: No    Non-medical: No  Tobacco Use  . Smoking status: Former Smoker    Packs/day: 1.50    Years: 20.00    Pack years: 30.00    Types: Cigarettes    Start date: 03/19/1979    Quit date: 08/25/1999    Years since quitting: 19.3  . Smokeless tobacco: Never Used  . Tobacco comment: smoking cessation materials not required  Substance and Sexual Activity  . Alcohol use: Not Currently    Alcohol/week: 0.0 standard drinks  . Drug use: No  . Sexual activity: Never  Lifestyle  . Physical activity    Days per week: 0 days    Minutes per session: 0 min  . Stress: Very much  Relationships  . Social Herbalist on phone: Patient refused    Gets together: Patient refused    Attends religious service: Patient refused    Active member of club or organization:  Patient refused    Attends meetings of clubs or organizations: Patient refused    Relationship status: Patient refused  . Intimate partner violence    Fear of current or ex partner: No    Emotionally abused: No    Physically abused: No    Forced sexual activity: No  Other Topics Concern  . Not on file  Social History Narrative   Patient is single.    Patient lives with roommates.    Patient on disability    Patient has no children.    Patient has some college      Current Outpatient Medications:  .  albuterol (PROVENTIL HFA;VENTOLIN HFA) 108 (90 Base) MCG/ACT inhaler, Inhale 2 puffs into the lungs every 6 (six) hours as needed for wheezing or shortness of breath., Disp: 1 Inhaler, Rfl: 2 .  albuterol (PROVENTIL) (2.5 MG/3ML) 0.083% nebulizer solution, Take 3 mLs (2.5 mg total) by nebulization every 6 (  six) hours as needed for wheezing or shortness of breath., Disp: 75 mL, Rfl: 2 .  ALPRAZolam (XANAX) 0.25 MG tablet, TAKE 1 TABLET(0.25 MG) BY MOUTH AT BEDTIME AS NEEDED FOR ANXIETY, Disp: 30 tablet, Rfl: 1 .  amiodarone (PACERONE) 200 MG tablet, Take 1 tablet (200 mg total) by mouth daily., Disp: 90 tablet, Rfl: 3 .  apixaban (ELIQUIS) 5 MG TABS tablet, Take 1 tablet (5 mg total) by mouth 2 (two) times daily., Disp: 180 tablet, Rfl: 2 .  cetirizine (ZYRTEC) 10 MG tablet, Take 10 mg by mouth daily., Disp: , Rfl:  .  Cholecalciferol (VITAMIN D-1000 MAX ST) 1000 UNITS tablet, Take 1,000 Units by mouth 2 (two) times daily. , Disp: , Rfl:  .  cyclobenzaprine (FLEXERIL) 10 MG tablet, Take 1 tablet (10 mg total) by mouth every 8 (eight) hours as needed., Disp: 30 tablet, Rfl: 1 .  DULoxetine (CYMBALTA) 60 MG capsule, Take 1 capsule (60 mg total) by mouth daily., Disp: 90 capsule, Rfl: 0 .  ferrous sulfate (IRON SUPPLEMENT) 325 (65 FE) MG tablet, Take 1 tablet by mouth daily., Disp: , Rfl:  .  hydrALAZINE (APRESOLINE) 25 MG tablet, Take 1 tablet (25 mg total) by mouth 3 (three) times daily. For  BP if above 150/90, Disp: 90 tablet, Rfl: 0 .  hydrOXYzine (ATARAX/VISTARIL) 10 MG tablet, Take 10 mg by mouth 3 (three) times daily as needed., Disp: , Rfl:  .  ipratropium (ATROVENT HFA) 17 MCG/ACT inhaler, Inhale 2 puffs into the lungs every 6 (six) hours as needed for wheezing. , Disp: , Rfl:  .  isosorbide mononitrate (IMDUR) 30 MG 24 hr tablet, Take 1 tablet (30 mg total) by mouth daily., Disp: 90 tablet, Rfl: 1 .  levothyroxine (SYNTHROID) 25 MCG tablet, Take 1 tablet (25 mcg total) by mouth daily before breakfast., Disp: 30 tablet, Rfl: 2 .  lidocaine (XYLOCAINE) 2 % solution, Use as directed 15 mLs in the mouth or throat as needed for mouth pain., Disp: 100 mL, Rfl: 0 .  Lysine 1000 MG TABS, Take 1,000 mg by mouth daily. , Disp: , Rfl:  .  Magnesium Oxide 400 (240 Mg) MG TABS, Take 400 mg by mouth 2 (two) times daily. , Disp: , Rfl:  .  metoCLOPramide (REGLAN) 5 MG tablet, Take 1 tablet (5 mg total) by mouth every 8 (eight) hours as needed for nausea or vomiting., Disp: 90 tablet, Rfl: 1 .  metoprolol succinate (TOPROL-XL) 50 MG 24 hr tablet, Take 1 tablet (50 mg total) by mouth daily. Take with or immediately following a meal., Disp: 90 tablet, Rfl: 1 .  mometasone-formoterol (DULERA) 200-5 MCG/ACT AERO, Inhale 2 puffs into the lungs 2 (two) times daily., Disp: , Rfl:  .  Multiple Vitamins-Minerals (MULTIVITAMIN PO), Take 1 tablet by mouth daily. , Disp: , Rfl:  .  Omega-3 Fatty Acids (FISH OIL) 1000 MG CAPS, Take 1,000 capsules by mouth 2 (two) times daily. , Disp: , Rfl:  .  ondansetron (ZOFRAN-ODT) 8 MG disintegrating tablet, Take 1 tablet (8 mg total) by mouth every 8 (eight) hours as needed for nausea or vomiting., Disp: 60 tablet, Rfl: 3 .  oxyCODONE-acetaminophen (PERCOCET) 10-325 MG tablet, Take 1 tablet by mouth every 4 (four) hours as needed for pain., Disp: 90 tablet, Rfl: 0 .  pantoprazole (PROTONIX) 40 MG tablet, Take 40 mg by mouth daily., Disp: , Rfl:  .  polyethylene glycol  powder (GLYCOLAX/MIRALAX) 17 GM/SCOOP powder, Take 17 g by mouth daily as  needed for mild constipation or moderate constipation. , Disp: , Rfl:  .  senna (SENOKOT) 8.6 MG TABS tablet, Take 1 tablet (8.6 mg total) by mouth daily., Disp: 120 tablet, Rfl: 0 .  SUMAtriptan (IMITREX) 100 MG tablet, May repeat in 2 hours if headache persists or recurs. (Patient taking differently: Take 100 mg by mouth daily as needed for migraine. May repeat in 2 hours if headache persists or recurs.), Disp: 9 tablet, Rfl: 1 .  temazepam (RESTORIL) 30 MG capsule, Take 1 capsule (30 mg total) by mouth at bedtime as needed for sleep., Disp: 90 capsule, Rfl: 1 .  torsemide (DEMADEX) 20 MG tablet, Take 2 tablets (40 mg total) by mouth 2 (two) times daily., Disp: 120 tablet, Rfl: 0 .  vitamin B-12 (CYANOCOBALAMIN) 1000 MCG tablet, Take 1,000 mcg by mouth daily., Disp: , Rfl:  .  vitamin C (ASCORBIC ACID) 500 MG tablet, Take 500 mg by mouth daily., Disp: , Rfl:  .  fluticasone (FLONASE) 50 MCG/ACT nasal spray, Place 2 sprays into both nostrils as needed., Disp: 48 g, Rfl: 1 .  losartan (COZAAR) 100 MG tablet, Take 1 tablet (100 mg total) by mouth daily., Disp: 30 tablet, Rfl: 0 No current facility-administered medications for this visit.   Facility-Administered Medications Ordered in Other Visits:  .  0.9 %  sodium chloride infusion, , Intravenous, Once, Finnegan, Kathlene November, MD .  0.9 %  sodium chloride infusion, , Intravenous, Once, Finnegan, Kathlene November, MD .  heparin lock flush 100 unit/mL, 500 Units, Intravenous, Once, Finnegan, Kathlene November, MD .  sodium chloride flush (NS) 0.9 % injection 10 mL, 10 mL, Intravenous, PRN, Lloyd Huger, MD, 10 mL at 06/16/17 0854 .  sodium chloride flush (NS) 0.9 % injection 10 mL, 10 mL, Intravenous, Once, Finnegan, Kathlene November, MD  No Known Allergies  I personally reviewed active problem list, medication list, allergies, family history, social history, health maintenance with the  patient/caregiver today.   ROS  Constitutional: Negative for fever or weight change.  Respiratory: Negative for cough , positive for shortness of breath.   Cardiovascular: Negative for chest pain or palpitations.  Gastrointestinal: positive  for abdominal pain, no bowel changes.  Musculoskeletal: Negative for gait problem or joint swelling.  Skin: Negative for rash.  Neurological: Negative for dizziness or headache.  No other specific complaints in a complete review of systems (except as listed in HPI above).  Objective  Vitals:   12/23/18 1403  BP: (!) 150/82  Pulse: (!) 54  Resp: 16  Temp: (!) 97.3 F (36.3 C)  TempSrc: Temporal  Weight: 144 lb 4.8 oz (65.5 kg)  Height: 5\' 2"  (1.575 m)    Body mass index is 26.39 kg/m.  Physical Exam  Constitutional: Patient appears well-developed No distress.  HEENT: head atraumatic, normocephalic, pupils equal and reactive to light Cardiovascular: Normal rate, regular rhythm and normal heart sounds.  No murmur heard. 1 plus  BLE edema. Pulmonary/Chest: Effort normal and breath sounds normal. No respiratory distress. Abdominal: Soft.  There is no tenderness. Psychiatric: Patient has a normal mood and affect. behavior is normal. Judgment and thought content normal.  PHQ2/9: Depression screen [facility] 2/9 12/23/2018 10/23/2018 09/28/2018 09/16/2018 09/16/2018  Decreased Interest 0 0 0 0 0  Down, Depressed, Hopeless 0 0 - 1 0  PHQ - 2 Score 0 0 0 1 0  Altered sleeping 1 0 0 3 0  Tired, decreased energy 1 0 1 3 0  Change in appetite 1  0 1 0 0  Feeling bad or failure about yourself  0 0 0 1 0  Trouble concentrating 0 0 0 1 0  Moving slowly or fidgety/restless 0 0 0 0 0  Suicidal thoughts 0 0 0 0 0  PHQ-9 Score 3 0 2 9 0  Difficult doing work/chores Not difficult at all - Not difficult at all Not difficult at all -  Some recent data might be hidden    phq 9 is positive   Fall Risk: Fall Risk  12/23/2018 11/27/2018 09/28/2018 09/16/2018  09/08/2018  Falls in the past year? 0 0 0 0 0  Comment - - - - -  Number falls in past yr: 0 0 0 0 0  Comment - - - - -  Injury with Fall? 0 0 0 0 0  Risk for fall due to : - - - - -  Risk for fall due to: Comment - - - - -  Follow up - - - - Falls prevention discussed     Functional Status Survey: Is the patient deaf or have difficulty hearing?: No Does the patient have difficulty seeing, even when wearing glasses/contacts?: No Does the patient have difficulty concentrating, remembering, or making decisions?: Yes Does the patient have difficulty walking or climbing stairs?: No Does the patient have difficulty dressing or bathing?: No Does the patient have difficulty doing errands alone such as visiting a doctor's office or shopping?: No    Assessment & Plan  1. Hemodialysis patient ([place])   2. Major depression, chronic (HCC)  - DULoxetine (CYMBALTA) 60 MG capsule; Take 1 capsule (60 mg total) by mouth daily.  Dispense: 90 capsule; Refill: 0  3. 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 Migraine without aura and without status migrainosus, not intractable  - SUMAtriptan (IMITREX) 100 MG tablet; Take 1 tablet (100 mg total) by mouth daily as needed for migraine. May repeat in 2 hours if headache persists or recurs.  Dispense: 10 tablet; Refill: 2  5. Hypothyroidism due to medication  She is taking medication   6. CHF (congestive heart failure), NYHA class IV, chronic, systolic (HCC)  - torsemide (DEMADEX) 20 MG tablet; Take 2 tablets (40 mg total) by mouth 2 (two) times daily.  Dispense: 120 tablet; Refill: 0  7. Encounter for immunization  - Flu Vaccine MDCK QUAD PF

## 2018-12-24 NOTE — Progress Notes (Signed)
Patient on schedule for Abdominal Pleurx catheter placement on 01/01/2019,called and gave pre procedure instructions at length with questions answered. Patient stated ENT had not called to inform her of procedure, thus encouraged her to touch base with her MD at ENT. Procedure discussed and patient made aware to be here @ 0900 on 01/01/2019 for 1000 start. Made aware to be NPO after midnight prior to procedure.Patient also made aware to hold ELIQUIS after pm dose on 12/29/2018. Patient stated understanding of instructions.

## 2018-12-25 ENCOUNTER — Telehealth: Payer: Self-pay

## 2018-12-25 NOTE — Telephone Encounter (Signed)
Attempted to call patient. LMTCB 12/25/2018

## 2018-12-25 NOTE — Telephone Encounter (Signed)
-----   Message from Nelva Bush, MD sent at 12/18/2018  6:59 AM EDT ----- No evidence of malignant ascites.  If patient has recurrent ascites, we should refer her to interventional radiology for placement of a PleurX catheter.

## 2018-12-25 NOTE — Telephone Encounter (Signed)
Incoming call returned from patient. Reviewed results and all questions answered.   Pt is getting catheter placement next week.   Advised pt to call for any further questions or concerns.

## 2018-12-27 ENCOUNTER — Other Ambulatory Visit: Payer: Self-pay | Admitting: Family Medicine

## 2018-12-27 DIAGNOSIS — I5022 Chronic systolic (congestive) heart failure: Secondary | ICD-10-CM

## 2018-12-29 ENCOUNTER — Other Ambulatory Visit: Admission: RE | Admit: 2018-12-29 | Payer: PPO | Source: Ambulatory Visit

## 2019-01-01 ENCOUNTER — Ambulatory Visit
Admission: RE | Admit: 2019-01-01 | Discharge: 2019-01-01 | Disposition: A | Discharge status: Home / Self Care | Payer: PPO | Source: Ambulatory Visit | Attending: Hospice and Palliative Medicine | Admitting: Hospice and Palliative Medicine

## 2019-01-01 ENCOUNTER — Other Ambulatory Visit: Payer: Self-pay | Admitting: Hospice and Palliative Medicine

## 2019-01-01 ENCOUNTER — Other Ambulatory Visit: Payer: Self-pay

## 2019-01-01 DIAGNOSIS — R180 Malignant ascites: Secondary | ICD-10-CM | POA: Insufficient documentation

## 2019-01-01 DIAGNOSIS — R188 Other ascites: Secondary | ICD-10-CM

## 2019-01-01 DIAGNOSIS — C569 Malignant neoplasm of unspecified ovary: Secondary | ICD-10-CM | POA: Insufficient documentation

## 2019-01-01 DIAGNOSIS — G8929 Other chronic pain: Secondary | ICD-10-CM

## 2019-01-01 DIAGNOSIS — M542 Cervicalgia: Secondary | ICD-10-CM

## 2019-01-01 HISTORY — PX: IR GUIDED DRAIN W CATHETER PLACEMENT: IMG719

## 2019-01-01 LAB — PROTIME-INR
INR: 1.1 (ref 0.8–1.2)
Prothrombin Time: 14.1 seconds (ref 11.4–15.2)

## 2019-01-01 LAB — CBC
HCT: 32.5 % — ABNORMAL LOW (ref 36.0–46.0)
Hemoglobin: 10.3 g/dL — ABNORMAL LOW (ref 12.0–15.0)
MCH: 29.4 pg (ref 26.0–34.0)
MCHC: 31.7 g/dL (ref 30.0–36.0)
MCV: 92.9 fL (ref 80.0–100.0)
Platelets: 273 10*3/uL (ref 150–400)
RBC: 3.5 MIL/uL — ABNORMAL LOW (ref 3.87–5.11)
RDW: 15.8 % — ABNORMAL HIGH (ref 11.5–15.5)
WBC: 6.3 10*3/uL (ref 4.0–10.5)
nRBC: 0 % (ref 0.0–0.2)

## 2019-01-01 MED ORDER — OXYCODONE-ACETAMINOPHEN 10-325 MG PO TABS: 1.0000 | ORAL_TABLET | ORAL | 0 refills | Status: DC | PRN

## 2019-01-01 MED ORDER — CEFAZOLIN SODIUM-DEXTROSE 2-4 GM/100ML-% IV SOLN
2.0000 g | Freq: Once | INTRAVENOUS | Status: AC
  Administered 2019-01-01: 11:00:00 2 g via INTRAVENOUS
  Filled 2019-01-01: qty 100

## 2019-01-01 MED ORDER — MIDAZOLAM HCL 5 MG/5ML IJ SOLN
INTRAMUSCULAR | Status: AC
  Administered 2019-01-01: 1 mg
  Filled 2019-01-01: qty 5

## 2019-01-01 MED ORDER — FENTANYL CITRATE (PF) 100 MCG/2ML IJ SOLN
INTRAMUSCULAR | Status: AC
  Administered 2019-01-01: 25 ug
  Administered 2019-01-01: 50 ug
  Filled 2019-01-01: qty 2

## 2019-01-01 NOTE — Progress Notes (Signed)
Refill Percocet (#90). PDMP reviewed.   Spoke with sister by phone. She feels patient is doing well. Patient has made several trips to the beach. She felt good enough to do yard work this week.   Patient is s/p abd pleurx insertion. Sister says she tolerated the procedure well. Per family, home health is scheduled to come to the house next week.

## 2019-01-01 NOTE — Procedures (Signed)
Interventional Radiology Procedure Note  Procedure: Tunneled peritoneal drainage catheter placement  Complications: None  Estimated Blood Loss: < 10 mL  Findings: Tunneled PleurX peritoneal cath via RLQ approach coursing to left upper abdomen. 3 L of ascites removed after catheter placement.  Venetia Night. Kathlene Cote, M.D Pager:  [phone]

## 2019-01-01 NOTE — Discharge Instructions (Signed)
Indwelling Pleural Catheter Home Guide  An indwelling pleural catheter is a thin, flexible tube that is inserted under your skin and into your chest. The catheter drains excess fluid that collects in the area between the chest wall and the lungs (pleural space).After the catheter is inserted, it can be attached to a bottle that collects fluid. The pleural catheter will allow you to drain fluid from your chest at home on a regular basis (sometimes daily). This will eliminate the need for frequent visits to the hospital or clinic to drain the fluid. The catheter may be removed after the excess fluid problem is resolved, usually after 2-3 months. It is important to follow instructions from your health care provider about how to drain and care for your catheter. What are the risks? Generally, this is a safe procedure. However, problems may occur, including:  Infection.  Skin damage around the catheter.  Lung damage.  Failure of the chest tube to work properly.  Spreading of cancer cells along the catheter, if you have cancer. Supplies needed:  Vacuum-sealed drainage bottle with attached drainage line.  Sterile dressing.  Sterile alcohol pads.  Sterile gloves.  Valve cap.  Sterile gauze pads, 4  4 inch (10 cm  10 cm).  Tape.  Adhesive dressing.  Sterile foam catheter pad. How to care for your catheter and insertion site  Wash your hands with soap and warm water before and after touching the catheter or insertion site. If soap and water are not available, use hand sanitizer.  Check your bandage (dressing) daily to make sure it is clean and dry.  Keep the skin around the catheter clean and dry.  Check the catheter regularly for any cracks or kinks in the tubing.  Check your catheter insertion site every day for signs of infection. Check for: ? Skin breakdown. ? Redness, swelling, or pain. ? Fluid or blood. ? Warmth. ? Pus or a bad smell. How to drain your catheter You  may need to drain your catheter every day, or more or less often as told by your health care provider. Follow instructions from your health care provider about how to drain your catheter. You may also refer to instructions that come with the drainage system. To drain the catheter: 1. Wash your hands with soap and warm water. If soap and water are not available, use hand sanitizer. 2. Carefully remove the dressing from around the catheter. 3. Wash your hands again. 4. Put on the gloves provided. 5. Prepare the vacuum-sealed drainage bottle and drainage line. Close the drainage line of the vacuum-sealed drainage bottle by squeezing the pinch clamp or rolling the wheel of the roller clamp toward the bottle. The vacuum in the bottle will be lost if the line is not closed completely. 6. Remove the access tip cover from the drainage line. Do not touch the end. Set it on a sterile surface. 7. Remove the catheter valve cap and throw it away. 8. Use an alcohol pad to clean the end of the catheter. 9. Insert the access tip into the catheter valve. Make sure the valve and access tip are securely connected. Listen for a click to confirm that they are connected. 10. Insert the T plunger to break the vacuum seal on the drainage bottle. 11. Open the clamp on the drainage line. 12. Allow the catheter to drain. Keep the catheter and the drainage bottle below the level of your chest. There may be a one-way valve on the end of the  tubing that will allow liquid and air to flow out of the catheter without letting air inside. 13. Drain the amount of fluid as told by your health care provider. It usually takes 5-15 minutes. Do not drain more than 1000 mL of fluid. You may feel a little discomfort while you are draining. If the pain is severe, stop draining and contact your health care provider. 14. After you finish draining the catheter, remove the drainage bottle tubing from the catheter. 15. Use a clean alcohol pad to  wipe the catheter tip. 16. Place a clean cap on the end of the catheter. 17. Use an alcohol pad to clean the skin around the catheter. 18. Allow the skin to air-dry. 19. Put the catheter pad on your skin. Curl the catheter into loops and place it on the pad. Do not place the catheter on your skin. 20. Replace the dressing over the catheter. 21. Discard the drainage bottle as instructed by your health care provider. Do not reuse the drainage bottle. How to change your dressing Change your dressing at least once a week, or more often if needed to keep the dressing dry. Be sure to change the dressing whenever it becomes moist. Your health care provider will tell you how often to change your dressing. 1. Wash your hands with soap and warm water. If soap and water are not available, use hand sanitizer. 2. Gently remove the old dressing. Avoid using scissors to remove the dressing. Sharp objects may damage the catheter. 3. Wash the skin around the insertion site with mild, fragrance-free soap and warm water. Rinse well, then pat the area dry with a clean cloth. 4. Check the skin around the catheter for signs of infection. Check for: ? Skin breakdown. ? Redness, swelling, or pain. ? Fluid or blood. ? Warmth. ? Pus or a bad smell. 5. If your catheter was stitched (sutured) to your skin, look at the suture to make sure it is still anchored in your skin. 6. Do not apply creams, ointments, or alcohol to the area. Let your skin air-dry completely before you apply a new dressing. 7. Curl the catheter into loops and place it on the sterile catheter pad. Do not place the catheter on your skin. 8. If you do not have a pad, use a clean dressing. Slide the dressing under the disk that holds the drainage catheter in place. 9. Use gauze to cover the catheter and the catheter pad. The catheter should rest on the pad or dressing, not on your skin. 10. Tape the dressing to your skin. You may be instructed to use an  adhesive dressing covering instead of gauze and tape. 11. Wash your hands with soap and warm water. If soap and water are not available, use hand sanitizer. General recommendations  Always wash your hands with soap and warm water before and after caring for your catheter and drainage bottle. Use a mild, fragrance-free soap. If soap and water are not available, use hand sanitizer.  Always make sure there are no leaks in the catheter or drainage bottle.  Each time you drain the catheter, note the color and amount of fluid.  Do not touch the tip of the catheter or the drainage bottle tubing.  Do not reuse drainage bottles.  Do not take baths, swim, or use a hot tub until your health care provider approves. Ask your health care provider if you may take showers. You may only be allowed to take sponge baths.  Take  deep breaths regularly, followed by a cough. Doing this can help to prevent lung infection. Contact a health care provider if:  You have any questions about caring for your catheter or drainage bottle.  You still have pain at the catheter insertion site more than 2 days after your procedure.  You have pain while draining your catheter.  Your catheter becomes bent, twisted, or cracked.  The connection between the catheter and the collection bottle becomes loose.  You have any of these around your catheter insertion site or coming from it: ? Skin breakdown. ? Redness, swelling, or pain. ? Fluid or blood. ? Warmth. ? Pus or a bad smell. Get help right away if:  You have a fever or chills.  You have chest pain.  You have dizziness or shortness of breath.  You have severe redness, swelling, or pain at your catheter insertion site.  The catheter comes out.  The catheter is blocked or clogged. Summary  An indwelling pleural catheter is a thin, flexible tube that is inserted under your skin and into your chest. The catheter drains excess fluid that collects in the area  between the chest wall and the lungs (pleural space).  It is important to follow instructions from your health care provider about how to drain and care for your catheter.  Do not touch the tip of the catheter or the drainage bottle tubing.  Always wash your hands with soap and water before and after caring for your catheter and drainage bottle. If soap and water are not available, use hand sanitizer. This information is not intended to replace advice given to you by your health care provider. Make sure you discuss any questions you have with your health care provider. Document Released: 06/27/2016 Document Revised: 06/26/2018 Document Reviewed: 06/27/2016 Elsevier Patient Education  [place]. Paracentesis, Care After This sheet gives you information about how to care for yourself after your procedure. Your health care provider may also give you more specific instructions. If you have problems or questions, contact your health care provider. What can I expect after the procedure? After the procedure, it is common to have a small amount of clear fluid coming from the puncture site. Follow these instructions at home: Puncture site care   Follow instructions from your health care provider about how to take care of your puncture site. Make sure you: ? Wash your hands with soap and water before and after you change your bandage (dressing). If soap and water are not available, use hand sanitizer. ? Change your dressing as told by your health care provider.  Check your puncture area every day signs of infection. Check for: ? Redness, swelling, or pain. ? More fluid or blood. ? Warmth. ? Pus or a bad smell. General instructions  Return to your normal activities as told by your health care provider. Ask your health care provider what activities are safe for you.  Take over-the-counter and prescription medicines only as told by your health care provider.  Do not take baths, swim, or  use a hot tub until your health care provider approves. Ask your health care provider if you may take showers. You may only be allowed to take sponge baths.  Keep all follow-up visits as told by your health care provider. This is important. Contact a health care provider if:  You have redness, swelling, or pain at your puncture site.  You have more fluid or blood coming from your puncture site.  Your puncture site  feels warm to the touch.  You have pus or a bad smell coming from your puncture site.  You have a fever. Get help right away if:  You have chest pain or shortness of breath.  You develop increasing pain, discomfort, or swelling in your abdomen.  You feel dizzy or light-headed or you faint. Summary  After the procedure, it is common to have a small amount of clear fluid coming from the puncture site.  Follow instructions from your health care provider about how to take care of your puncture site.  Check your puncture area every day signs of infection.  Keep all follow-up visits as told by your health care provider. This information is not intended to replace advice given to you by your health care provider. Make sure you discuss any questions you have with your health care provider. Document Released: 07/19/2014 Document Revised: 02/03/2018 Document Reviewed: 12/23/2017 Elsevier Patient Education  2020 [employer].

## 2019-01-05 ENCOUNTER — Ambulatory Visit
Admission: RE | Admit: 2019-01-05 | Discharge: 2019-01-05 | Disposition: A | Discharge status: Home / Self Care | Payer: PPO | Source: Ambulatory Visit | Attending: Nurse Practitioner | Admitting: Nurse Practitioner

## 2019-01-05 ENCOUNTER — Ambulatory Visit
Admission: RE | Admit: 2019-01-05 | Discharge: 2019-01-05 | Disposition: A | Discharge status: Home / Self Care | Payer: PPO | Attending: Nurse Practitioner | Admitting: Nurse Practitioner

## 2019-01-05 ENCOUNTER — Telehealth: Payer: Self-pay | Admitting: *Deleted

## 2019-01-05 ENCOUNTER — Other Ambulatory Visit: Payer: Self-pay

## 2019-01-05 ENCOUNTER — Inpatient Hospital Stay: Payer: PPO | Attending: Hospice and Palliative Medicine | Admitting: Nurse Practitioner

## 2019-01-05 VITALS — BP 112/65 | HR 62 | Temp 97.3°F | Resp 18 | Wt 151.6 lb

## 2019-01-05 DIAGNOSIS — I10 Essential (primary) hypertension: Secondary | ICD-10-CM | POA: Diagnosis not present

## 2019-01-05 DIAGNOSIS — E785 Hyperlipidemia, unspecified: Secondary | ICD-10-CM | POA: Diagnosis not present

## 2019-01-05 DIAGNOSIS — K5900 Constipation, unspecified: Secondary | ICD-10-CM | POA: Insufficient documentation

## 2019-01-05 DIAGNOSIS — R109 Unspecified abdominal pain: Secondary | ICD-10-CM | POA: Diagnosis not present

## 2019-01-05 DIAGNOSIS — R059 Cough, unspecified: Secondary | ICD-10-CM

## 2019-01-05 DIAGNOSIS — Z803 Family history of malignant neoplasm of breast: Secondary | ICD-10-CM | POA: Insufficient documentation

## 2019-01-05 DIAGNOSIS — Z7901 Long term (current) use of anticoagulants: Secondary | ICD-10-CM | POA: Diagnosis not present

## 2019-01-05 DIAGNOSIS — Z7189 Other specified counseling: Secondary | ICD-10-CM | POA: Diagnosis not present

## 2019-01-05 DIAGNOSIS — R05 Cough: Secondary | ICD-10-CM | POA: Diagnosis not present

## 2019-01-05 DIAGNOSIS — Z87891 Personal history of nicotine dependence: Secondary | ICD-10-CM | POA: Insufficient documentation

## 2019-01-05 DIAGNOSIS — R180 Malignant ascites: Secondary | ICD-10-CM | POA: Insufficient documentation

## 2019-01-05 DIAGNOSIS — Z79899 Other long term (current) drug therapy: Secondary | ICD-10-CM | POA: Diagnosis not present

## 2019-01-05 DIAGNOSIS — C569 Malignant neoplasm of unspecified ovary: Secondary | ICD-10-CM | POA: Insufficient documentation

## 2019-01-05 DIAGNOSIS — K219 Gastro-esophageal reflux disease without esophagitis: Secondary | ICD-10-CM | POA: Diagnosis not present

## 2019-01-05 DIAGNOSIS — G893 Neoplasm related pain (acute) (chronic): Secondary | ICD-10-CM | POA: Diagnosis not present

## 2019-01-05 DIAGNOSIS — J45909 Unspecified asthma, uncomplicated: Secondary | ICD-10-CM | POA: Diagnosis not present

## 2019-01-05 DIAGNOSIS — Z7951 Long term (current) use of inhaled steroids: Secondary | ICD-10-CM | POA: Insufficient documentation

## 2019-01-05 DIAGNOSIS — Z9221 Personal history of antineoplastic chemotherapy: Secondary | ICD-10-CM | POA: Diagnosis not present

## 2019-01-05 MED ORDER — LACTULOSE 20 G PO PACK: 20.0000 g | PACK | Freq: Two times a day (BID) | ORAL | 0 refills | Status: DC

## 2019-01-05 NOTE — Progress Notes (Signed)
[facility]  [phone] [phone]  Patient Care Team: Steele Sizer, MD as PCP - General (Family Medicine) End, Harrell Gave, MD as PCP - Cardiology (Cardiology) Lloyd Huger, MD as Consulting Physician (Oncology) Mellody Drown, MD as Consulting Physician (Obstetrics and Gynecology) Cathi Roan, [facility] (Pharmacist) Clent Jacks, RN as Registered Nurse   Name of the patient: Jessica Burgess  [id number]  Sep 14, 1955   Date of visit: 01/05/19  Diagnosis- Metastatic Ovarian Cancer  Chief complaint/ Reason for visit- Abdominal Fullness, Acid Reflux, and Cough  Heme/Onc history:  History of stage IIIc high-grade serous carcinoma status post debulking surgery at [facility] in 2018.  She had 1 cycle of neoadjuvant Keytruda as part of clinical trial.  She received adjuvant carbo-Taxol.  After second cycles, she developed worsening neuropathy and Taxol was discontinued.  She received additional 2 cycles of carbo only then progressed.  Received 4 cycles of Doxil and Avastin.  Progressed & declining performance status.  Initiated gemcitabine on 02/19/2018.  Past medical history notable for ESRD, recently started dialysis and severe diastolic dysfunction with recurrent CHF and severe pulmonary hypertension.  She was hospitalized 08/28/2018-08/30/2018 with CHF.  She is followed by palliative care  Oncology History  Ovarian cancer, unspecified laterality ([place])  11/12/2016 Initial Diagnosis   Ovarian cancer, unspecified laterality ([place])   08/08/2017 - 12/07/2017 Chemotherapy   The patient had bevacizumab (AVASTIN) 700 mg in sodium chloride 0.9 % 100 mL chemo infusion, 725 mg, Intravenous,  Once, 4 of 6 cycles Administration: 700 mg (08/18/2017), 700 mg (09/01/2017), 700 mg (09/15/2017), 700 mg (09/29/2017), 700 mg (10/13/2017), 700 mg (10/27/2017), 700 mg (11/10/2017), 700 mg (11/24/2017) DOXOrubicin HCL LIPOSOMAL (DOXIL) 70 mg in  dextrose 5 % 250 mL chemo infusion, 72 mg, Intravenous,  Once, 4 of 6 cycles Administration: 70 mg (08/18/2017), 70 mg (09/15/2017), 70 mg (10/13/2017), 70 mg (11/10/2017)  for chemotherapy treatment.    02/19/2018 -  Chemotherapy   The patient had gemcitabine (GEMZAR) 1,800 mg in sodium chloride 0.9 % 250 mL chemo infusion, 1,748 mg, Intravenous,  Once, 7 of 9 cycles Dose modification: 900 mg/m2 (original dose 1,000 mg/m2, Cycle 1, Reason: Other (see comments)) Administration: 1,800 mg (02/19/2018), 1,800 mg (02/26/2018), 1,600 mg (03/05/2018), 1,600 mg (03/23/2018), 1,600 mg (03/30/2018), 1,600 mg (04/06/2018), 1,600 mg (04/20/2018), 1,600 mg (04/27/2018), 1,600 mg (05/25/2018), 1,600 mg (06/01/2018), 1,600 mg (06/08/2018), 1,600 mg (06/22/2018), 1,600 mg (06/29/2018), 1,600 mg (07/13/2018), 1,600 mg (07/27/2018), 1,600 mg (09/24/2018), 1,600 mg (10/06/2018)  for chemotherapy treatment.      Interval history- Jessica Burgess, 63 year old female with above history of recurrent high grade serous ovarian carcinoma, [facility] for reports of acid reflux, chest congestion with cough, and abdominal fullness.   She says she hasn't had a bowel movement in about 5 days.  Has been reluctant to take medications she says that she feels that her Pleurx catheter needs to be drained.  She has not yet initiated home health for ongoing management  Currently on surveillance.  She has opted to forego future chemotherapy.  Hospice was discussed but patient wishes to continue with dialysis.  Followed by palliative care.  Ca125 has been rising, was 1150 on 12/09/2018.  She has chronic abdominal pain.  No nausea or vomiting.  Tolerating food and fluids.  Says her appetite is generally reduced but she has been drinking boost and Ensure's.  Says she is getting plenty of fluids.  She has some  intermittent reflux-like symptoms.  Has not taken anything for symptoms.  Her sister participates in the visit by phone and  contributes to history.  ECOG FS:2 - Symptomatic, <50% confined to bed  Review of systems- Review of Systems  Constitutional: Positive for malaise/fatigue. Negative for chills, fever and weight loss.  HENT: Negative for hearing loss, nosebleeds, sore throat and tinnitus.   Eyes: Negative for blurred vision and double vision.  Respiratory: Positive for cough. Negative for hemoptysis, shortness of breath (with exertion) and wheezing.   Cardiovascular: Negative for chest pain, palpitations and leg swelling.  Gastrointestinal: Positive for abdominal pain, constipation and heartburn. Negative for blood in stool, diarrhea, melena, nausea and vomiting.  Genitourinary: Negative for dysuria and urgency.  Musculoskeletal: Negative for back pain, falls, joint pain and myalgias.  Skin: Negative for itching and rash.  Neurological: Negative for dizziness, tingling, sensory change, loss of consciousness, weakness and headaches.  Endo/Heme/Allergies: Negative for environmental allergies. Does not bruise/bleed easily.  Psychiatric/Behavioral: Negative for depression. The patient is nervous/anxious. The patient does not have insomnia.     Current treatment-surveillance  No Known Allergies  Past Medical History:  Diagnosis Date   Allergic rhinitis, cause unspecified    Anxiety state, unspecified    Arthritis    Asthma    only when sick    Backache, unspecified    Bronchitis    hx of when get sick   Cancer [facility])    skin cancer , basal cell    Cancer ([place]) 11/2016   ovarian   Cervicalgia    CHF (congestive heart failure) (HCC)    Complication of anesthesia    Dermatophytosis of nail    Dysmetabolic syndrome X    Encounter for long-term (current) use of other medications    Esophageal reflux    Hypertension    Insomnia, unspecified    Leukocytosis, unspecified    Migraine without aura, without mention of intractable migraine without mention of status migrainosus    Other  and unspecified hyperlipidemia    Other malaise and fatigue    Overweight(278.02)    Personal history of chemotherapy now   ovarian   Renal insufficiency    Spinal stenosis in cervical region    Symptomatic menopausal or female climacteric states    Unspecified disorder of skin and subcutaneous tissue    Unspecified vitamin D deficiency     Past Surgical History:  Procedure Laterality Date   ABDOMINAL HYSTERECTOMY     ANTERIOR CERVICAL DECOMP/DISCECTOMY FUSION N/A 06/07/2015   Procedure: Cervical three - four and Cervical six- seven anterior cervical decompression with fusion interbody prosthesis plating and bonegraft;  Surgeon: Newman Pies, MD;  Location: [place] [facility];  Service: Neurosurgery;  Laterality: N/A;  C34 and C67 anterior cervical decompression with fusion interbody prosthesis plating and bonegraft   BACK SURGERY     x2 Lower    DIALYSIS/PERMA CATHETER INSERTION N/A 08/17/2018   Procedure: DIALYSIS/PERMA CATHETER INSERTION;  Surgeon: Algernon Huxley, MD;  Location: [place] CV LAB;  Service: Cardiovascular;  Laterality: N/A;   EVACUATION OF CERVICAL HEMATOMA N/A 06/14/2015   Procedure: EVACUATION OF CERVICAL HEMATOMA;  Surgeon: Newman Pies, MD;  Location: [place] [facility];  Service: Neurosurgery;  Laterality: N/A;   IR GUIDED DRAIN W CATHETER PLACEMENT  01/01/2019   NECK SURGERY     x3   TONSILLECTOMY      Social History   Socioeconomic History   Marital status: Single    Spouse name: Not on  file   Number of children: 0   Years of education: some college   Highest education level: 12th grade  Occupational History    Employer: DISABLED   Occupation: Disabled   Scientist, product/process development strain: Somewhat hard   Food insecurity    Worry: Never true    Inability: Never true   Transportation needs    Medical: No    Non-medical: No  Tobacco Use   Smoking status: Former Smoker    Packs/day: 1.50    Years: 20.00    Pack years:  30.00    Types: Cigarettes    Start date: 03/19/1979    Quit date: 08/25/1999    Years since quitting: 19.3   Smokeless tobacco: Never Used   Tobacco comment: smoking cessation materials not required  Substance and Sexual Activity   Alcohol use: Not Currently    Alcohol/week: 0.0 standard drinks   Drug use: No   Sexual activity: Never  Lifestyle   Physical activity    Days per week: 0 days    Minutes per session: 0 min   Stress: Very much  Relationships   Social connections    Talks on phone: Patient refused    Gets together: Patient refused    Attends religious service: Patient refused    Active member of club or organization: Patient refused    Attends meetings of clubs or organizations: Patient refused    Relationship status: Patient refused   Intimate partner violence    Fear of current or ex partner: No    Emotionally abused: No    Physically abused: No    Forced sexual activity: No  Other Topics Concern   Not on file  Social History Narrative   Patient is single.    Patient lives with roommates.    Patient on disability    Patient has no children.    Patient has some college     Family History  Problem Relation Age of Onset   Depression Mother    Migraines Mother    Dementia Father    Diabetes Father    Hyperlipidemia Father    Hyperlipidemia Brother    Hyperlipidemia Brother    Breast cancer Paternal Aunt        66s     Current Outpatient Medications:    albuterol (PROVENTIL HFA;VENTOLIN HFA) 108 (90 Base) MCG/ACT inhaler, Inhale 2 puffs into the lungs every 6 (six) hours as needed for wheezing or shortness of breath., Disp: 1 Inhaler, Rfl: 2   albuterol (PROVENTIL) (2.5 MG/3ML) 0.083% nebulizer solution, Take 3 mLs (2.5 mg total) by nebulization every 6 (six) hours as needed for wheezing or shortness of breath., Disp: 75 mL, Rfl: 2   ALPRAZolam (XANAX) 0.25 MG tablet, TAKE 1 TABLET(0.25 MG) BY MOUTH AT BEDTIME AS NEEDED FOR ANXIETY,  Disp: 30 tablet, Rfl: 1   amiodarone (PACERONE) 200 MG tablet, Take 1 tablet (200 mg total) by mouth daily., Disp: 90 tablet, Rfl: 3   apixaban (ELIQUIS) 5 MG TABS tablet, Take 1 tablet (5 mg total) by mouth 2 (two) times daily., Disp: 180 tablet, Rfl: 2   cetirizine (ZYRTEC) 10 MG tablet, Take 10 mg by mouth daily., Disp: , Rfl:    Cholecalciferol (VITAMIN D-1000 MAX ST) 1000 UNITS tablet, Take 1,000 Units by mouth 2 (two) times daily. , Disp: , Rfl:    cyclobenzaprine (FLEXERIL) 10 MG tablet, Take 1 tablet (10 mg total) by mouth every 8 (eight) hours as  needed., Disp: 30 tablet, Rfl: 1   DULoxetine (CYMBALTA) 60 MG capsule, Take 1 capsule (60 mg total) by mouth daily., Disp: 90 capsule, Rfl: 0   ferrous sulfate (IRON SUPPLEMENT) 325 (65 FE) MG tablet, Take 1 tablet by mouth daily., Disp: , Rfl:    hydrALAZINE (APRESOLINE) 25 MG tablet, Take 1 tablet (25 mg total) by mouth 3 (three) times daily. For BP if above 150/90, Disp: 90 tablet, Rfl: 0   hydrOXYzine (ATARAX/VISTARIL) 10 MG tablet, Take 10 mg by mouth 3 (three) times daily as needed., Disp: , Rfl:    isosorbide mononitrate (IMDUR) 30 MG 24 hr tablet, Take 1 tablet (30 mg total) by mouth daily., Disp: 90 tablet, Rfl: 1   levothyroxine (SYNTHROID) 25 MCG tablet, Take 1 tablet (25 mcg total) by mouth daily before breakfast., Disp: 30 tablet, Rfl: 2   Lysine 1000 MG TABS, Take 1,000 mg by mouth daily. , Disp: , Rfl:    Magnesium Oxide 400 (240 Mg) MG TABS, Take 400 mg by mouth 2 (two) times daily. , Disp: , Rfl:    metoCLOPramide (REGLAN) 5 MG tablet, Take 1 tablet (5 mg total) by mouth every 8 (eight) hours as needed for nausea or vomiting., Disp: 90 tablet, Rfl: 1   metoprolol succinate (TOPROL-XL) 50 MG 24 hr tablet, Take 1 tablet (50 mg total) by mouth daily. Take with or immediately following a meal., Disp: 90 tablet, Rfl: 1   mometasone-formoterol (DULERA) 200-5 MCG/ACT AERO, Inhale 2 puffs into the lungs 2 (two) times  daily., Disp: , Rfl:    Multiple Vitamins-Minerals (MULTIVITAMIN PO), Take 1 tablet by mouth daily. , Disp: , Rfl:    Omega-3 Fatty Acids (FISH OIL) 1000 MG CAPS, Take 1,000 capsules by mouth 2 (two) times daily. , Disp: , Rfl:    ondansetron (ZOFRAN-ODT) 8 MG disintegrating tablet, Take 1 tablet (8 mg total) by mouth every 8 (eight) hours as needed for nausea or vomiting., Disp: 60 tablet, Rfl: 3   oxyCODONE-acetaminophen (PERCOCET) 10-325 MG tablet, Take 1 tablet by mouth every 4 (four) hours as needed for pain., Disp: 90 tablet, Rfl: 0   pantoprazole (PROTONIX) 40 MG tablet, Take 40 mg by mouth daily., Disp: , Rfl:    polyethylene glycol powder (GLYCOLAX/MIRALAX) 17 GM/SCOOP powder, Take 17 g by mouth daily as needed for mild constipation or moderate constipation. , Disp: , Rfl:    senna (SENOKOT) 8.6 MG TABS tablet, Take 1 tablet (8.6 mg total) by mouth daily. (Patient taking differently: Take 1 tablet by mouth 2 (two) times daily. ), Disp: 120 tablet, Rfl: 0   SUMAtriptan (IMITREX) 100 MG tablet, Take 1 tablet (100 mg total) by mouth daily as needed for migraine. May repeat in 2 hours if headache persists or recurs., Disp: 10 tablet, Rfl: 2   temazepam (RESTORIL) 30 MG capsule, Take 1 capsule (30 mg total) by mouth at bedtime as needed for sleep., Disp: 90 capsule, Rfl: 1   torsemide (DEMADEX) 20 MG tablet, Take 2 tablets (40 mg total) by mouth 2 (two) times daily., Disp: 120 tablet, Rfl: 0   vitamin B-12 (CYANOCOBALAMIN) 1000 MCG tablet, Take 1,000 mcg by mouth daily., Disp: , Rfl:    vitamin C (ASCORBIC ACID) 500 MG tablet, Take 500 mg by mouth daily., Disp: , Rfl:    fluticasone (FLONASE) 50 MCG/ACT nasal spray, Place 2 sprays into both nostrils as needed., Disp: 48 g, Rfl: 1   ipratropium (ATROVENT HFA) 17 MCG/ACT inhaler, Inhale 2 puffs  into the lungs every 6 (six) hours as needed for wheezing. , Disp: , Rfl:    lidocaine (XYLOCAINE) 2 % solution, Use as directed 15 mLs in the  mouth or throat as needed for mouth pain. (Patient not taking: Reported on 01/01/2019), Disp: 100 mL, Rfl: 0   losartan (COZAAR) 100 MG tablet, Take 1 tablet (100 mg total) by mouth daily., Disp: 30 tablet, Rfl: 0 No current facility-administered medications for this visit.   Facility-Administered Medications Ordered in Other Visits:    0.9 %  sodium chloride infusion, , Intravenous, Once, Finnegan, Kathlene November, MD   0.9 %  sodium chloride infusion, , Intravenous, Once, Finnegan, Kathlene November, MD   heparin lock flush 100 unit/mL, 500 Units, Intravenous, Once, Grayland Ormond, Kathlene November, MD   sodium chloride flush (NS) 0.9 % injection 10 mL, 10 mL, Intravenous, PRN, Lloyd Huger, MD, 10 mL at 06/16/17 0854   sodium chloride flush (NS) 0.9 % injection 10 mL, 10 mL, Intravenous, Once, Lloyd Huger, MD  Physical exam:  Vitals:   01/05/19 1136  BP: 112/65  Pulse: 62  Resp: 18  Temp: (!) 97.3 F (36.3 C)  TempSrc: Tympanic  SpO2: 95%  Weight: 151 lb 9.6 oz (68.8 kg)   Physical Exam Constitutional:      Appearance: She is well-developed.     Comments: Sitting in exam room. Unaccompanied. Wearing mask.   HENT:     Head: Atraumatic.     Nose: Nose normal.     Mouth/Throat:     Pharynx: No oropharyngeal exudate.  Eyes:     General: No scleral icterus.    Conjunctiva/sclera: Conjunctivae normal.     Pupils: Pupils are equal, round, and reactive to light.  Cardiovascular:     Rate and Rhythm: Normal rate and regular rhythm.  Pulmonary:     Effort: Pulmonary effort is normal.     Breath sounds: Normal breath sounds.  Abdominal:     General: Bowel sounds are normal. There is no distension.     Palpations: Abdomen is soft.     Tenderness: There is abdominal tenderness in the right lower quadrant. There is no right CVA tenderness, guarding or rebound.  Musculoskeletal:        General: Normal range of motion.     Cervical back: Normal range of motion and neck supple.  Skin:     General: Skin is warm and dry.  Neurological:     Mental Status: She is alert and oriented to person, place, and time.      CMP Latest Ref Rng & Units 11/03/2018  Glucose 70 - 99 mg/dL 111(H)  BUN 8 - 23 mg/dL 15  Creatinine 0.44 - 1.00 mg/dL 2.16(H)  Sodium 135 - 145 mmol/L 137  Potassium 3.5 - 5.1 mmol/L 4.1  Chloride 98 - 111 mmol/L 97(L)  CO2 22 - 32 mmol/L 30  Calcium 8.9 - 10.3 mg/dL 8.2(L)  Total Protein 6.5 - 8.1 g/dL 7.0  Total Bilirubin 0.3 - 1.2 mg/dL 0.5  Alkaline Phos 38 - 126 U/L 71  AST 15 - 41 U/L 17  ALT 0 - 44 U/L 10   CBC Latest Ref Rng & Units 01/01/2019  WBC 4.0 - 10.5 K/uL 6.3  Hemoglobin 12.0 - 15.0 g/dL 10.3(L)  Hematocrit 36.0 - 46.0 % 32.5(L)  Platelets 150 - 400 K/uL 273    No images are attached to the encounter.  Ir Lenise Arena W Catheter Placement  Result Date: 01/01/2019 CLINICAL  DATA:  History of serous ovarian carcinoma and malignant ascites. Need for tunneled peritoneal drainage catheter for palliative care. EXAM: INSERTION OF TUNNELED PERITONEAL DRAINAGE CATHETER ANESTHESIA/SEDATION: 1.0 mg IV Versed; 75 mcg IV Fentanyl. Total Moderate Sedation Time 15 minutes. The patient's level of consciousness and physiologic status were continuously monitored during the procedure by Radiology nursing. MEDICATIONS: 2 g IV Ancef. Antibiotic was administered in an appropriate time interval for the procedure. FLUOROSCOPY TIME:  Less than 6 seconds.  1.5 mGy. PROCEDURE: The procedure, risks, benefits, and alternatives were explained to the patient. Questions regarding the procedure were encouraged and answered. The patient understands and consents to the procedure. A time-out was performed prior to initiating the procedure. The right abdominal wall was prepped with chlorhexidine in a sterile fashion, and a sterile drape was applied covering the operative field. A sterile gown and sterile gloves were used for the procedure. Local anesthesia was provided with 1%  Lidocaine. Ultrasound image documentation was performed. Fluoroscopy during the procedure and fluoroscopic spot radiograph confirms appropriate catheter position. After creating a small skin incision, a 19 gauge needle was advanced into the peritoneal cavity under ultrasound guidance. A guide wire was then advanced under fluoroscopy into the peritoneal cavity. Peritoneal access was dilated serially and a 16-French peel-away sheath placed. A 15.5 French tunneled peritoneal PleurX catheter was placed. This was tunneled from an incision 5 cm below the peritoneal access to the access site. The catheter was advanced through the peel-away sheath. The sheath was then removed. Final catheter positioning was confirmed with a fluoroscopic spot image. The peritoneal access incision was closed with subcutaneous and subcuticular 4-0 Vicryl. Dermabond was applied to the incision. A Prolene retention suture was applied at the catheter exit site. Large volume paracentesis was performed through the new catheter utilizing drainage bottles. COMPLICATIONS: None. FINDINGS: The catheter was placed via the right abdominal wall. Catheter course is to the left with the tip in the left upper quadrant. Approximately 3.5 liters of ascites was able to be removed after catheter placement. IMPRESSION: Placement of tunneled peritoneal drainage catheter via right abdominal approach. 3.5 liters of ascites was removed today after catheter placement. Electronically Signed   By: Aletta Edouard M.D.   On: 01/01/2019 12:02   US Paracentesis  Result Date: 12/21/2018 INDICATION: Recurrent ascites EXAM: ULTRASOUND GUIDED PARACENTESIS MEDICATIONS: None. COMPLICATIONS: None immediate. PROCEDURE: Informed written consent was obtained from the patient after a discussion of the risks, benefits and alternatives to treatment. A timeout was performed prior to the initiation of the procedure. Initial ultrasound scanning demonstrates a large amount of ascites  within the right lower abdominal quadrant. The right lower abdomen was prepped and draped in the usual sterile fashion. 1% lidocaine was used for local anesthesia. Following this, a 6 Fr Safe-T-Centesis catheter was introduced. An ultrasound image was saved for documentation purposes. The paracentesis was performed. The catheter was removed and a dressing was applied. The patient tolerated the procedure well without immediate post procedural complication. Patient received post-procedure intravenous albumin; see nursing notes for details. FINDINGS: A total of approximately 2.5 L of clear yellow fluid was removed. Samples were sent to the laboratory as requested by the clinical team. IMPRESSION: Successful ultrasound-guided paracentesis yielding 2.5 liters of peritoneal fluid. Electronically Signed   By: Inez Catalina M.D.   On: 12/21/2018 15:24   US Paracentesis  Result Date: 12/11/2018 INDICATION: 63 year old with recurrent ascites. History of metastatic ovarian cancer. EXAM: ULTRASOUND GUIDED PARACENTESIS MEDICATIONS: None. COMPLICATIONS: None immediate. PROCEDURE: Informed  written consent was obtained from the patient after a discussion of the risks, benefits and alternatives to treatment. A timeout was performed prior to the initiation of the procedure. Initial ultrasound scanning demonstrates a large amount of ascites within the right lower abdominal quadrant. The right lower abdomen was prepped and draped in the usual sterile fashion. 1% lidocaine was used for local anesthesia. Following this, a 6 Fr Safe-T-Centesis catheter was introduced. An ultrasound image was saved for documentation purposes. The paracentesis was performed. The catheter was removed and a dressing was applied. The patient tolerated the procedure well without immediate post procedural complication. FINDINGS: A total of approximately 2.6 L of amber colored fluid was removed. IMPRESSION: Successful ultrasound-guided paracentesis yielding  2.6 liters of peritoneal fluid. Procedure was performed by Ascencion Dike, PA-C. Electronically Signed   By: Markus Daft M.D.   On: 12/11/2018 16:20    Assessment and plan- Patient is a 63 y.o. female diagnosed with metastatic high grade serous ovarian cancer who [facility] for reflux, ascites, and constipation.   Constipation- likely secondary to poor fluid & fiber intake, reduced performance status. Unclear if taking stool softeners currently. Imaging shows extensive stool but no findings suggestive of obstruction. Discussed bowel regimen (increasing vs initiating)- senna & miralax titrate up to goal. Discussed mag citrate for refractory symptoms. Consider lactulose if no improvement. Consider imaging to r/o obstruction if persistent or worsening symptoms.   Ascites- known recurrent malignant ascites with pleurx in place. Care and teaching provided today by nursing and 1650 ml fluid drained. Referral to home health sent today.   Abdominal Pain- likely secondary to ascites, malignancy, and constipation. Question developing malignant bowel obstruction as she is high risk based on progressive disease. Discussed with palliative care and medical oncology who recommends treating underlying constipation and draining ascites today. If symptoms persist, consider palliative measures for malignant bowel obstruction. If partial bowel obstruction, consider reglan (avoid in complete obstruction), dex, scopolamine/hyoscyamine, glycopyrrolate, octreotide.   Reflux- suspect exacerbated by ascites, carcinomatosis. Unclear if taking PPI as prescribed. Discussed restarting. Can take zofran for nausea as prescribed. If progressive symptoms would r/o MBO (see above).   Cough- etiology unclear. Discussed covid testing which she declined. May be related to reflux symptoms vs URI. Chest x- ray today showed minimal bilateral pleural effusions and minimal atelectasis. If worsening, would have low  threshold for initiating antibiotics.   Goals of care- discussed that symptoms are likely reflective of progressive disease. Patient interested in hospice but would like to continue hemodialysis at this time. She understands that to be eligible for hospice, she would stop HD. She verbalizes that at some point in the near future she will no longer be able to go to HD or wish to and at that time she would like to initiate hospice services. In the interim, we will continue to support her with outpatient palliative care. Case discussed with Billey Chang, NP today.   Assessment and plan of care discussed with patient's sister in detail.    Visit Diagnosis 1. Constipation, unspecified constipation type   2. Malignant ascites   3. Pain of metastatic malignancy   4. Gastroesophageal reflux disease, unspecified whether esophagitis present   5. Cough   6. Goals of care, counseling/discussion    Patient expressed understanding and was in agreement with this plan. She also understands that She can call clinic at any time with any questions, concerns, or complaints.   A total of (50) minutes of face-to-face time  was spent with this patient with greater than 50% of that time in counseling and care-coordination.  Thank you for allowing me to participate in the care of this very pleasant patient.   Beckey Rutter, DNP, AGNP-[facility]

## 2019-01-05 NOTE — Telephone Encounter (Signed)
If she is afebrile can come here to see [facility] or Josh

## 2019-01-05 NOTE — Telephone Encounter (Signed)
Patient coming to see Josh at [facility]

## 2019-01-05 NOTE — Telephone Encounter (Signed)
Patients sister called reporting that she is having chest congestion and reflux, She is asking "how to proceed" go to ER, come see [place] or what. Please advise

## 2019-01-05 NOTE — Progress Notes (Signed)
pleurx cath accessed and drained per hospital policy and v/o Beckey Rutter, NP. Patient tolerated the procedure well and she drained 1650 ml from pleurx. Patient education reinforced re: pleurx cath management. Patient participated in pleurx cath draining. Per patient, home health has not yet contacted her to set up services for pleurx cath mgmt. Patient does not have any supplies at home to drain pleurx cath. States that no supplies were given to her to drain at home s/p placement in IR. Patient provided with 2 "1000 ml" pleurx cath bottles to take home with her in case she needs to drain in the next few days. Almyra Free, CMA will f/u with Adv. Home care referral. She has contacted Corene Cornea via secure msg to f/u on referral. Faxed signed order from Dr. Grayland Ormond to [facility] services for pleurx cath suppies

## 2019-01-06 ENCOUNTER — Inpatient Hospital Stay (HOSPITAL_BASED_OUTPATIENT_CLINIC_OR_DEPARTMENT_OTHER): Payer: PPO | Admitting: Hospice and Palliative Medicine

## 2019-01-06 ENCOUNTER — Other Ambulatory Visit: Payer: Self-pay | Admitting: *Deleted

## 2019-01-06 DIAGNOSIS — R180 Malignant ascites: Secondary | ICD-10-CM

## 2019-01-06 DIAGNOSIS — R197 Diarrhea, unspecified: Secondary | ICD-10-CM | POA: Diagnosis not present

## 2019-01-06 DIAGNOSIS — J069 Acute upper respiratory infection, unspecified: Secondary | ICD-10-CM

## 2019-01-06 DIAGNOSIS — Z515 Encounter for palliative care: Secondary | ICD-10-CM | POA: Diagnosis not present

## 2019-01-06 DIAGNOSIS — C569 Malignant neoplasm of unspecified ovary: Secondary | ICD-10-CM | POA: Diagnosis not present

## 2019-01-06 MED ORDER — SENNA 8.6 MG PO TABS: 1.0000 | ORAL_TABLET | Freq: Two times a day (BID) | ORAL | 2 refills | Status: DC

## 2019-01-06 MED ORDER — LACTULOSE 10 GM/15ML PO SOLN: 10.0000 g | Freq: Two times a day (BID) | ORAL | 0 refills | Status: DC | PRN

## 2019-01-06 MED ORDER — CYCLOBENZAPRINE HCL 10 MG PO TABS: 10.0000 mg | ORAL_TABLET | Freq: Three times a day (TID) | ORAL | 1 refills | Status: DC | PRN

## 2019-01-06 MED ORDER — LACTULOSE 10 GM/15ML PO SOLN: 10.0000 g | Freq: Every day | ORAL | 0 refills | Status: DC

## 2019-01-06 NOTE — Telephone Encounter (Signed)
Received notification from [facility] that pt's insurance does not cover Lactulose in the powder/packet form.  Per [employer], sent rx for Lactulose solution to pharmacy.  Patient notified

## 2019-01-06 NOTE — Progress Notes (Signed)
Virtual Visit via Telephone Note  I connected with Jessica Burgess on 01/06/19 at  2:00 PM EDT by telephone and verified that I am speaking with the correct person using two identifiers.   I discussed the limitations, risks, security and privacy concerns of performing an evaluation and management service by telephone and the availability of in person appointments. I also discussed with the patient that there may be a patient responsible charge related to this service. The patient expressed understanding and agreed to proceed.   History of Present Illness: Palliative Care consult requested for this62 y.o.femalewith multiple medical problems including stage IIIc high-grade serous ovarian carcinoma.She is status post debulking surgery at [facility] in 2018 and received one preoperative dose of Keytruda as part of a clinical trial. Patient completed adjuvant carboplatinum and Taxol, which was discontinued secondary to worsening neuropathy. Patient was also tried on Avastin and doxorubicin due to disease progression but was ultimately switched to gemcitabine secondary to declining performance status. She has been on treatment with gemcitabine since December 2019. PMH is also notable for ESRD recently started on dialysis and severe diastolic dysfunction with recurrent CHF and severe pulmonary HTN. Patient was recently hospitalized 08/28/2018 to 08/30/2018 with CHF. Patient was referred to palliative care to help address symptoms and goals.    Observations/Objective: Patient was seen in the symptom management clinic yesterday for URI symptoms and constipation.  Chest x-ray did not show infiltrates.  She did have note of pleural effusion.  Patient had her Pleurx drained yesterday in the clinic with 1650 mL removed.  Patient missed dialysis yesterday.  Today, patient says that her nonproductive cough is improved.  She continues to have nasal congestion but no sore throat or loss of taste/smell.  She has been  wearing oxygen today but denies significant shortness of breath.  She denies fever or chills.  Patient says she feels like she will be better in a few days.  Assessment and Plan: High grade serous ovarian cancer - currently with plan for surveillance. Patient has previously opted to forgo future chemotherapy. CA 125 continues to rise.  Was 779 on 8/21 and 1150 on 9/23.  Patient continues to decline chemotherapy.  Recommend best supportive care.  URI -recommended Covid testing but patient declined.  Treat symptomatically.  Would have low threshold for initiation of antibiotics.  Ascites -status post Pleurx catheter, which was drained yesterday.  Home health has been ordered for in-home teaching of Pleurx management.  Constipation -increase senna to 2 tablets twice daily.  May use lactulose as needed.  Follow Up Instructions: Follow-up telephone visit in 2 weeks   I discussed the assessment and treatment plan with the patient. The patient was provided an opportunity to ask questions and all were answered. The patient agreed with the plan and demonstrated an understanding of the instructions.   The patient was advised to call back or seek an in-person evaluation if the symptoms worsen or if the condition fails to improve as anticipated.  I provided 10 minutes of non-face-to-face time during this encounter.   Irean Hong, NP

## 2019-01-08 ENCOUNTER — Ambulatory Visit: Payer: PPO | Admitting: Internal Medicine

## 2019-01-11 ENCOUNTER — Inpatient Hospital Stay (HOSPITAL_BASED_OUTPATIENT_CLINIC_OR_DEPARTMENT_OTHER): Payer: PPO | Admitting: Oncology

## 2019-01-11 ENCOUNTER — Other Ambulatory Visit: Payer: Self-pay

## 2019-01-11 VITALS — BP 156/90 | HR 94 | Temp 96.7°F | Resp 20

## 2019-01-11 DIAGNOSIS — C569 Malignant neoplasm of unspecified ovary: Secondary | ICD-10-CM

## 2019-01-11 DIAGNOSIS — Z7901 Long term (current) use of anticoagulants: Secondary | ICD-10-CM | POA: Diagnosis not present

## 2019-01-11 DIAGNOSIS — J45909 Unspecified asthma, uncomplicated: Secondary | ICD-10-CM | POA: Diagnosis not present

## 2019-01-11 DIAGNOSIS — Z87891 Personal history of nicotine dependence: Secondary | ICD-10-CM | POA: Diagnosis not present

## 2019-01-11 DIAGNOSIS — R109 Unspecified abdominal pain: Secondary | ICD-10-CM | POA: Diagnosis not present

## 2019-01-11 DIAGNOSIS — Z7951 Long term (current) use of inhaled steroids: Secondary | ICD-10-CM | POA: Diagnosis not present

## 2019-01-11 DIAGNOSIS — R180 Malignant ascites: Secondary | ICD-10-CM | POA: Diagnosis not present

## 2019-01-11 DIAGNOSIS — E785 Hyperlipidemia, unspecified: Secondary | ICD-10-CM | POA: Diagnosis not present

## 2019-01-11 DIAGNOSIS — Z803 Family history of malignant neoplasm of breast: Secondary | ICD-10-CM | POA: Diagnosis not present

## 2019-01-11 DIAGNOSIS — K5900 Constipation, unspecified: Secondary | ICD-10-CM

## 2019-01-11 DIAGNOSIS — Z9221 Personal history of antineoplastic chemotherapy: Secondary | ICD-10-CM | POA: Diagnosis not present

## 2019-01-11 DIAGNOSIS — Z79899 Other long term (current) drug therapy: Secondary | ICD-10-CM | POA: Diagnosis not present

## 2019-01-11 DIAGNOSIS — I10 Essential (primary) hypertension: Secondary | ICD-10-CM | POA: Diagnosis not present

## 2019-01-11 MED ORDER — FENTANYL 12 MCG/HR TD PT72: 1.0000 | MEDICATED_PATCH | TRANSDERMAL | 0 refills | Status: DC

## 2019-01-11 MED ORDER — METOCLOPRAMIDE HCL 5 MG PO TABS: 5.0000 mg | ORAL_TABLET | Freq: Three times a day (TID) | ORAL | 1 refills | Status: DC | PRN

## 2019-01-11 MED ORDER — PANTOPRAZOLE SODIUM 40 MG PO TBEC: 40.0000 mg | DELAYED_RELEASE_TABLET | Freq: Every day | ORAL | 0 refills | Status: AC

## 2019-01-11 NOTE — Progress Notes (Signed)
Symptom Management Consult note [facility]  Telephone:([phone] [phone]  Patient Care Team: Steele Sizer, MD as PCP - General (Family Medicine) End, Harrell Gave, MD as PCP - Cardiology (Cardiology) Lloyd Huger, MD as Consulting Physician (Oncology) Mellody Drown, MD as Consulting Physician (Obstetrics and Gynecology) Cathi Roan, [facility] (Pharmacist) Clent Jacks, RN as Registered Nurse   Name of the patient: Jessica Burgess  [id number]  10-16-1955   Date of visit: 01/11/2019   Diagnosis-  Metastatic ovarian cancer  Chief complaint/ Reason for visit- Drain pleurex cath/constipation  Heme/Onc history:  Oncology History  Ovarian cancer, unspecified laterality ([place])  11/12/2016 Initial Diagnosis   Ovarian cancer, unspecified laterality ([place])   08/08/2017 - 12/07/2017 Chemotherapy   The patient had bevacizumab (AVASTIN) 700 mg in sodium chloride 0.9 % 100 mL chemo infusion, 725 mg, Intravenous,  Once, 4 of 6 cycles Administration: 700 mg (08/18/2017), 700 mg (09/01/2017), 700 mg (09/15/2017), 700 mg (09/29/2017), 700 mg (10/13/2017), 700 mg (10/27/2017), 700 mg (11/10/2017), 700 mg (11/24/2017) DOXOrubicin HCL LIPOSOMAL (DOXIL) 70 mg in dextrose 5 % 250 mL chemo infusion, 72 mg, Intravenous,  Once, 4 of 6 cycles Administration: 70 mg (08/18/2017), 70 mg (09/15/2017), 70 mg (10/13/2017), 70 mg (11/10/2017)  for chemotherapy treatment.    02/19/2018 -  Chemotherapy   The patient had gemcitabine (GEMZAR) 1,800 mg in sodium chloride 0.9 % 250 mL chemo infusion, 1,748 mg, Intravenous,  Once, 7 of 9 cycles Dose modification: 900 mg/m2 (original dose 1,000 mg/m2, Cycle 1, Reason: Other (see comments)) Administration: 1,800 mg (02/19/2018), 1,800 mg (02/26/2018), 1,600 mg (03/05/2018), 1,600 mg (03/23/2018), 1,600 mg (03/30/2018), 1,600 mg (04/06/2018), 1,600 mg (04/20/2018), 1,600 mg (04/27/2018), 1,600 mg (05/25/2018), 1,600 mg (06/01/2018), 1,600 mg  (06/08/2018), 1,600 mg (06/22/2018), 1,600 mg (06/29/2018), 1,600 mg (07/13/2018), 1,600 mg (07/27/2018), 1,600 mg (09/24/2018), 1,600 mg (10/06/2018)  for chemotherapy treatment.     Interval history: Patient presents to [facility] to have her pleurex catheter drained.  She presented last week and had 1.6 L of yellow fluid drained.  Today, she states her belly feels "full" and she has not had a bowel movement in approximately 3 days.  Last bowel movement was Thursday afternoon where she had 4 loose stools after taking senna 2 tablets twice a day, MiraLAX and lactulose as needed.  She also has magnesium citrate at home.   She has chronic abdominal pain since being diagnosed with cancer but feels it is increased since becoming constipated.  She questions having a bowel obstruction given her cancer "is in that area".  She has intermittent nausea without vomiting.  She has tolerated food okay over the weekend.  She typically eats 1 large meal followed by snacks.  She drinks boost and ensures as needed.  States she drinks plenty of fluids.  She denies any fevers.  She was evaluated last week for acid reflux, constipation and abdominal pain.  Imaging revealed no evidence of a bowel obstruction but extensive stool throughout the colon.  Small bilateral pleural effusion and ascites present.  She was started on a bowel regimen and had had 4 bm's on Thursday of last week.   ECOG FS:1 - Symptomatic but completely ambulatory  Review of systems- Review of Systems  Constitutional: Positive for malaise/fatigue. Negative for chills, fever and weight loss.  HENT: Negative for ear pain and tinnitus.   Eyes: Negative.  Negative for blurred vision and double vision.  Respiratory: Positive for cough, sputum production and shortness  of breath.   Cardiovascular: Negative.  Negative for chest pain, palpitations and leg swelling.  Gastrointestinal: Positive for abdominal pain, constipation and nausea. Negative for diarrhea and vomiting.    Genitourinary: Negative for dysuria, frequency and urgency.  Musculoskeletal: Negative for back pain and falls.  Skin: Negative.  Negative for rash.  Neurological: Positive for weakness. Negative for headaches.  Endo/Heme/Allergies: Negative.  Does not bruise/bleed easily.  Psychiatric/Behavioral: Positive for depression. The patient is nervous/anxious. The patient does not have insomnia.      Current treatment- s/p debulking surgery at [facility] in 2018 and received 1 preoperative dose of Keytruda as part of clinical trial.  Completed adjuvant carbo/Taxol which was discontinued secondary to worsening neuropathy.  Also tried Avastin doxorubicin due to progression of disease but ultimately switched to gemcitabine secondary to declining performance status.  She was on treatment with gemcitabine since December 2019.  Currently not on active treatment.  No Known Allergies   Past Medical History:  Diagnosis Date   Allergic rhinitis, cause unspecified    Anxiety state, unspecified    Arthritis    Asthma    only when sick    Backache, unspecified    Bronchitis    hx of when get sick   Cancer [facility])    skin cancer , basal cell    Cancer ([place]) 11/2016   ovarian   Cervicalgia    CHF (congestive heart failure) (HCC)    Complication of anesthesia    Dermatophytosis of nail    Dysmetabolic syndrome X    Encounter for long-term (current) use of other medications    Esophageal reflux    Hypertension    Insomnia, unspecified    Leukocytosis, unspecified    Migraine without aura, without mention of intractable migraine without mention of status migrainosus    Other and unspecified hyperlipidemia    Other malaise and fatigue    Overweight(278.02)    Personal history of chemotherapy now   ovarian   Renal insufficiency    Spinal stenosis in cervical region    Symptomatic menopausal or female climacteric states    Unspecified disorder of skin and subcutaneous tissue     Unspecified vitamin D deficiency      Past Surgical History:  Procedure Laterality Date   ABDOMINAL HYSTERECTOMY     ANTERIOR CERVICAL DECOMP/DISCECTOMY FUSION N/A 06/07/2015   Procedure: Cervical three - four and Cervical six- seven anterior cervical decompression with fusion interbody prosthesis plating and bonegraft;  Surgeon: Newman Pies, MD;  Location: [place] NEURO ORS;  Service: Neurosurgery;  Laterality: N/A;  C34 and C67 anterior cervical decompression with fusion interbody prosthesis plating and bonegraft   BACK SURGERY     x2 Lower    DIALYSIS/PERMA CATHETER INSERTION N/A 08/17/2018   Procedure: DIALYSIS/PERMA CATHETER INSERTION;  Surgeon: Algernon Huxley, MD;  Location: [place] CV LAB;  Service: Cardiovascular;  Laterality: N/A;   EVACUATION OF CERVICAL HEMATOMA N/A 06/14/2015   Procedure: EVACUATION OF CERVICAL HEMATOMA;  Surgeon: Newman Pies, MD;  Location: [place] NEURO ORS;  Service: Neurosurgery;  Laterality: N/A;   IR GUIDED DRAIN W CATHETER PLACEMENT  01/01/2019   NECK SURGERY     x3   TONSILLECTOMY      Social History   Socioeconomic History   Marital status: Single    Spouse name: Not on file   Number of children: 0   Years of education: some college   Highest education level: 12th grade  Occupational History    Employer:  DISABLED   Occupation: Disabled   Scientist, product/process development strain: Somewhat hard   Food insecurity    Worry: Never true    Inability: Never true   Transportation needs    Medical: No    Non-medical: No  Tobacco Use   Smoking status: Former Smoker    Packs/day: 1.50    Years: 20.00    Pack years: 30.00    Types: Cigarettes    Start date: 03/19/1979    Quit date: 08/25/1999    Years since quitting: 19.3   Smokeless tobacco: Never Used   Tobacco comment: smoking cessation materials not required  Substance and Sexual Activity   Alcohol use: Not Currently    Alcohol/week: 0.0 standard drinks   Drug use:  No   Sexual activity: Never  Lifestyle   Physical activity    Days per week: 0 days    Minutes per session: 0 min   Stress: Very much  Relationships   Social connections    Talks on phone: Patient refused    Gets together: Patient refused    Attends religious service: Patient refused    Active member of club or organization: Patient refused    Attends meetings of clubs or organizations: Patient refused    Relationship status: Patient refused   Intimate partner violence    Fear of current or ex partner: No    Emotionally abused: No    Physically abused: No    Forced sexual activity: No  Other Topics Concern   Not on file  Social History Narrative   Patient is single.    Patient lives with roommates.    Patient on disability    Patient has no children.    Patient has some college     Family History  Problem Relation Age of Onset   Depression Mother    Migraines Mother    Dementia Father    Diabetes Father    Hyperlipidemia Father    Hyperlipidemia Brother    Hyperlipidemia Brother    Breast cancer Paternal Aunt        64s     Current Outpatient Medications:    albuterol (PROVENTIL HFA;VENTOLIN HFA) 108 (90 Base) MCG/ACT inhaler, Inhale 2 puffs into the lungs every 6 (six) hours as needed for wheezing or shortness of breath., Disp: 1 Inhaler, Rfl: 2   albuterol (PROVENTIL) (2.5 MG/3ML) 0.083% nebulizer solution, Take 3 mLs (2.5 mg total) by nebulization every 6 (six) hours as needed for wheezing or shortness of breath., Disp: 75 mL, Rfl: 2   ALPRAZolam (XANAX) 0.25 MG tablet, TAKE 1 TABLET(0.25 MG) BY MOUTH AT BEDTIME AS NEEDED FOR ANXIETY, Disp: 30 tablet, Rfl: 1   amiodarone (PACERONE) 200 MG tablet, Take 1 tablet (200 mg total) by mouth daily., Disp: 90 tablet, Rfl: 3   apixaban (ELIQUIS) 5 MG TABS tablet, Take 1 tablet (5 mg total) by mouth 2 (two) times daily., Disp: 180 tablet, Rfl: 2   cetirizine (ZYRTEC) 10 MG tablet, Take 10 mg by mouth  daily., Disp: , Rfl:    Cholecalciferol (VITAMIN D-1000 MAX ST) 1000 UNITS tablet, Take 1,000 Units by mouth 2 (two) times daily. , Disp: , Rfl:    cyclobenzaprine (FLEXERIL) 10 MG tablet, Take 1 tablet (10 mg total) by mouth every 8 (eight) hours as needed., Disp: 30 tablet, Rfl: 1   DULoxetine (CYMBALTA) 60 MG capsule, Take 1 capsule (60 mg total) by mouth daily., Disp: 90 capsule, Rfl: 0  fentaNYL (DURAGESIC) 12 MCG/HR, Place 1 patch onto the skin every 3 (three) days., Disp: 10 patch, Rfl: 0   ferrous sulfate (IRON SUPPLEMENT) 325 (65 FE) MG tablet, Take 1 tablet by mouth daily., Disp: , Rfl:    fluticasone (FLONASE) 50 MCG/ACT nasal spray, Place 2 sprays into both nostrils as needed., Disp: 48 g, Rfl: 1   hydrALAZINE (APRESOLINE) 25 MG tablet, Take 1 tablet (25 mg total) by mouth 3 (three) times daily. For BP if above 150/90, Disp: 90 tablet, Rfl: 0   hydrOXYzine (ATARAX/VISTARIL) 10 MG tablet, Take 10 mg by mouth 3 (three) times daily as needed., Disp: , Rfl:    ipratropium (ATROVENT HFA) 17 MCG/ACT inhaler, Inhale 2 puffs into the lungs every 6 (six) hours as needed for wheezing. , Disp: , Rfl:    isosorbide mononitrate (IMDUR) 30 MG 24 hr tablet, Take 1 tablet (30 mg total) by mouth daily., Disp: 90 tablet, Rfl: 1   lactulose (CHRONULAC) 10 GM/15ML solution, Take 15 mLs (10 g total) by mouth 2 (two) times daily as needed for mild constipation., Disp: 473 mL, Rfl: 0   levothyroxine (SYNTHROID) 25 MCG tablet, Take 1 tablet (25 mcg total) by mouth daily before breakfast., Disp: 30 tablet, Rfl: 2   lidocaine (XYLOCAINE) 2 % solution, Use as directed 15 mLs in the mouth or throat as needed for mouth pain. (Patient not taking: Reported on 01/01/2019), Disp: 100 mL, Rfl: 0   losartan (COZAAR) 100 MG tablet, Take 1 tablet (100 mg total) by mouth daily., Disp: 30 tablet, Rfl: 0   Lysine 1000 MG TABS, Take 1,000 mg by mouth daily. , Disp: , Rfl:    Magnesium Oxide 400 (240 Mg) MG TABS,  Take 400 mg by mouth 2 (two) times daily. , Disp: , Rfl:    metoCLOPramide (REGLAN) 5 MG tablet, Take 1 tablet (5 mg total) by mouth every 8 (eight) hours as needed for nausea or vomiting., Disp: 90 tablet, Rfl: 1   metoprolol succinate (TOPROL-XL) 50 MG 24 hr tablet, Take 1 tablet (50 mg total) by mouth daily. Take with or immediately following a meal., Disp: 90 tablet, Rfl: 1   mometasone-formoterol (DULERA) 200-5 MCG/ACT AERO, Inhale 2 puffs into the lungs 2 (two) times daily., Disp: , Rfl:    Multiple Vitamins-Minerals (MULTIVITAMIN PO), Take 1 tablet by mouth daily. , Disp: , Rfl:    Omega-3 Fatty Acids (FISH OIL) 1000 MG CAPS, Take 1,000 capsules by mouth 2 (two) times daily. , Disp: , Rfl:    ondansetron (ZOFRAN-ODT) 8 MG disintegrating tablet, Take 1 tablet (8 mg total) by mouth every 8 (eight) hours as needed for nausea or vomiting., Disp: 60 tablet, Rfl: 3   oxyCODONE-acetaminophen (PERCOCET) 10-325 MG tablet, Take 1 tablet by mouth every 4 (four) hours as needed for pain., Disp: 90 tablet, Rfl: 0   pantoprazole (PROTONIX) 40 MG tablet, Take 1 tablet (40 mg total) by mouth daily., Disp: 90 tablet, Rfl: 0   polyethylene glycol powder (GLYCOLAX/MIRALAX) 17 GM/SCOOP powder, Take 17 g by mouth daily as needed for mild constipation or moderate constipation. , Disp: , Rfl:    senna (SENOKOT) 8.6 MG TABS tablet, Take 1-2 tablets (8.6-17.2 mg total) by mouth 2 (two) times daily., Disp: 90 tablet, Rfl: 2   SUMAtriptan (IMITREX) 100 MG tablet, Take 1 tablet (100 mg total) by mouth daily as needed for migraine. May repeat in 2 hours if headache persists or recurs., Disp: 10 tablet, Rfl: 2  temazepam (RESTORIL) 30 MG capsule, Take 1 capsule (30 mg total) by mouth at bedtime as needed for sleep., Disp: 90 capsule, Rfl: 1   torsemide (DEMADEX) 20 MG tablet, Take 2 tablets (40 mg total) by mouth 2 (two) times daily., Disp: 120 tablet, Rfl: 0   vitamin B-12 (CYANOCOBALAMIN) 1000 MCG tablet,  Take 1,000 mcg by mouth daily., Disp: , Rfl:    vitamin C (ASCORBIC ACID) 500 MG tablet, Take 500 mg by mouth daily., Disp: , Rfl:  No current facility-administered medications for this visit.   Facility-Administered Medications Ordered in Other Visits:    0.9 %  sodium chloride infusion, , Intravenous, Once, Finnegan, Kathlene November, MD   0.9 %  sodium chloride infusion, , Intravenous, Once, Finnegan, Kathlene November, MD   heparin lock flush 100 unit/mL, 500 Units, Intravenous, Once, Finnegan, Kathlene November, MD   sodium chloride flush (NS) 0.9 % injection 10 mL, 10 mL, Intravenous, PRN, Lloyd Huger, MD, 10 mL at 06/16/17 0854   sodium chloride flush (NS) 0.9 % injection 10 mL, 10 mL, Intravenous, Once, Lloyd Huger, MD  Physical exam:  Vitals:   01/11/19 1110  BP: (!) 156/90  Pulse: 94  Resp: 20  Temp: (!) 96.7 F (35.9 C)  TempSrc: Tympanic   Physical Exam Constitutional:      Appearance: Normal appearance.  HENT:     Head: Normocephalic and atraumatic.  Eyes:     Pupils: Pupils are equal, round, and reactive to light.  Neck:     Musculoskeletal: Normal range of motion.  Cardiovascular:     Rate and Rhythm: Normal rate and regular rhythm.     Heart sounds: Normal heart sounds. No murmur.  Pulmonary:     Effort: Pulmonary effort is normal.     Breath sounds: Normal breath sounds. No wheezing.  Abdominal:     General: Bowel sounds are normal. There is no distension.     Palpations: Abdomen is soft.     Tenderness: There is abdominal tenderness in the right upper quadrant.    Musculoskeletal: Normal range of motion.  Skin:    General: Skin is warm and dry.     Findings: No rash.  Neurological:     Mental Status: She is alert and oriented to person, place, and time.  Psychiatric:        Judgment: Judgment normal.      CMP Latest Ref Rng & Units 11/03/2018  Glucose 70 - 99 mg/dL 111(H)  BUN 8 - 23 mg/dL 15  Creatinine 0.44 - 1.00 mg/dL 2.16(H)  Sodium 135 -  145 mmol/L 137  Potassium 3.5 - 5.1 mmol/L 4.1  Chloride 98 - 111 mmol/L 97(L)  CO2 22 - 32 mmol/L 30  Calcium 8.9 - 10.3 mg/dL 8.2(L)  Total Protein 6.5 - 8.1 g/dL 7.0  Total Bilirubin 0.3 - 1.2 mg/dL 0.5  Alkaline Phos 38 - 126 U/L 71  AST 15 - 41 U/L 17  ALT 0 - 44 U/L 10   CBC Latest Ref Rng & Units 01/01/2019  WBC 4.0 - 10.5 K/uL 6.3  Hemoglobin 12.0 - 15.0 g/dL 10.3(L)  Hematocrit 36.0 - 46.0 % 32.5(L)  Platelets 150 - 400 K/uL 273    No images are attached to the encounter.  Dg Chest 2 View  Result Date: 01/05/2019 CLINICAL DATA:  Productive cough. Vomiting. Constipation. Metastatic ovarian cancer. EXAM: CHEST - 2 VIEW COMPARISON:  Chest x-ray dated 10/13/2018 and 10/10/2018 FINDINGS: Power port in place with  the tip in the superior vena cava just below the level of the carina. Double lumen dialysis catheter in place with the tips at the cavoatrial junction. Heart size and pulmonary vascularity are normal. There are minimal bilateral pleural effusions. No discrete infiltrates. Minimal atelectasis at the left lung base. No acute bone abnormality. IMPRESSION: 1. Minimal bilateral pleural effusions. 2. Minimal atelectasis at the left lung base. 3. No discrete infiltrates. Electronically Signed   By: Lorriane Shire M.D.   On: 01/05/2019 13:01   Ir Guided Niel Hummer W Catheter Placement  Result Date: 01/01/2019 CLINICAL DATA:  History of serous ovarian carcinoma and malignant ascites. Need for tunneled peritoneal drainage catheter for palliative care. EXAM: INSERTION OF TUNNELED PERITONEAL DRAINAGE CATHETER ANESTHESIA/SEDATION: 1.0 mg IV Versed; 75 mcg IV Fentanyl. Total Moderate Sedation Time 15 minutes. The patient's level of consciousness and physiologic status were continuously monitored during the procedure by Radiology nursing. MEDICATIONS: 2 g IV Ancef. Antibiotic was administered in an appropriate time interval for the procedure. FLUOROSCOPY TIME:  Less than 6 seconds.  1.5 mGy.  PROCEDURE: The procedure, risks, benefits, and alternatives were explained to the patient. Questions regarding the procedure were encouraged and answered. The patient understands and consents to the procedure. A time-out was performed prior to initiating the procedure. The right abdominal wall was prepped with chlorhexidine in a sterile fashion, and a sterile drape was applied covering the operative field. A sterile gown and sterile gloves were used for the procedure. Local anesthesia was provided with 1% Lidocaine. Ultrasound image documentation was performed. Fluoroscopy during the procedure and fluoroscopic spot radiograph confirms appropriate catheter position. After creating a small skin incision, a 19 gauge needle was advanced into the peritoneal cavity under ultrasound guidance. A guide wire was then advanced under fluoroscopy into the peritoneal cavity. Peritoneal access was dilated serially and a 16-French peel-away sheath placed. A 15.5 French tunneled peritoneal PleurX catheter was placed. This was tunneled from an incision 5 cm below the peritoneal access to the access site. The catheter was advanced through the peel-away sheath. The sheath was then removed. Final catheter positioning was confirmed with a fluoroscopic spot image. The peritoneal access incision was closed with subcutaneous and subcuticular 4-0 Vicryl. Dermabond was applied to the incision. A Prolene retention suture was applied at the catheter exit site. Large volume paracentesis was performed through the new catheter utilizing drainage bottles. COMPLICATIONS: None. FINDINGS: The catheter was placed via the right abdominal wall. Catheter course is to the left with the tip in the left upper quadrant. Approximately 3.5 liters of ascites was able to be removed after catheter placement. IMPRESSION: Placement of tunneled peritoneal drainage catheter via right abdominal approach. 3.5 liters of ascites was removed today after catheter placement.  Electronically Signed   By: Aletta Edouard M.D.   On: 01/01/2019 12:02   US Paracentesis  Result Date: 12/21/2018 INDICATION: Recurrent ascites EXAM: ULTRASOUND GUIDED PARACENTESIS MEDICATIONS: None. COMPLICATIONS: None immediate. PROCEDURE: Informed written consent was obtained from the patient after a discussion of the risks, benefits and alternatives to treatment. A timeout was performed prior to the initiation of the procedure. Initial ultrasound scanning demonstrates a large amount of ascites within the right lower abdominal quadrant. The right lower abdomen was prepped and draped in the usual sterile fashion. 1% lidocaine was used for local anesthesia. Following this, a 6 Fr Safe-T-Centesis catheter was introduced. An ultrasound image was saved for documentation purposes. The paracentesis was performed. The catheter was removed and a dressing was applied.  The patient tolerated the procedure well without immediate post procedural complication. Patient received post-procedure intravenous albumin; see nursing notes for details. FINDINGS: A total of approximately 2.5 L of clear yellow fluid was removed. Samples were sent to the laboratory as requested by the clinical team. IMPRESSION: Successful ultrasound-guided paracentesis yielding 2.5 liters of peritoneal fluid. Electronically Signed   By: Inez Catalina M.D.   On: 12/21/2018 15:24   Dg Abd 2 Views  Result Date: 01/05/2019 CLINICAL DATA:  Nausea and constipation. Productive cough. Metastatic ovarian cancer. Peritoneal drainage catheter. EXAM: ABDOMEN - 2 VIEW COMPARISON:  Radiograph dated 08/10/2018 FINDINGS: There are no dilated loops of large or small bowel. There is extensive stool throughout the colon including in the rectum but there is no discrete fecal impaction. No significant air in the bowel. Ascites. Peritoneal drainage catheter tip is in the left upper quadrant. There are small bilateral pleural effusions. Surgical clips from previous  cholecystectomy. No acute bone abnormality. IMPRESSION: 1. Extensive stool throughout the colon. No discrete fecal impaction. 2. No findings suggestive of bowel obstruction. 3. Small bilateral pleural effusions.  Ascites. Electronically Signed   By: Lorriane Shire M.D.   On: 01/05/2019 12:59     Assessment and plan- Patient is a 63 y.o. female Who presents to symptom management to have her Pleurx catheter drained.  Unfortunately, she has not received her supplies for her to drain her catheter herself.  She is also complaining of abdominal pain that is worse over the past few days.  Stage IIIc high-grade serous ovarian carcinoma: Status post debulking surgery at [facility] and received 1 preoperative dose of Keytruda as part of a clinical trial.  Completed adjuvant carbo/Taxol which was discontinued secondary to worsening neuropathy.  Tried Avastin and doxorubicin due to progression but was ultimately switched to gemcitabine secondary to declining performance status.  She was on treatment with gemcitabine from December 2019-May 2020.  Currently not on active treatment.  Last CT abdomen revealed slight progression of disease.  Abdominal pain: Multifactorial.  Patient had abdominal x-ray last week revealing constipation with extensive stool throughout the colon.  There was no suggestive findings of bowel obstruction.  She was instructed to use lactulose, Senokot and MiraLAX.  She had 4 loose bowel movements on Thursday.  She has not had a bowel movement since.  There are several factors that could be causing her abdominal pain including constipation, malignancy and fluid on her abdomen.  She is chronically nauseated but denies any vomiting.  Unclear if she is taking Protonix or Reglan at this time.  Will reinforce, see plan below.  Plan: Drain her Pleurx catheter today.  925 mL of yellow fluid was removed. Continue current bowel regimen.  Unclear if she is actually taking but reviewed and wrote down medications  that she should be taking including Senokot, MiraLAX and either lactulose or magnesium citrate. Continue percocet prn q 4 for pain.  RX 12.5 mg Fentanyl patch q 72 hours.  Refill Reglan.  RX Protonix 40 mg daily.   Disposition: Return to clinic on 01/20/2019 for follow-up with [facility]. We will touch base with her at the end of this week to see if she continues to have constipation/abdominal pain. May require additional imaging if no bowel movement.   Visit Diagnosis 1. 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[place])   2. Constipation, unspecified constipation type   3. Malignant ascites     Patient expressed understanding and was in agreement with this plan. She also  understands that She can call clinic at any time with any questions, concerns, or complaints.   Greater than 50% was spent in counseling and coordination of care with this patient including but not limited to discussion of the relevant topics above (See A&P) including, but not limited to diagnosis and management of acute and chronic medical conditions.   Thank you for allowing me to participate in the care of this very pleasant patient.    Jacquelin Hawking, NP [place] [facility] Cell - [phone] Pager- [phone] 01/11/2019 1:15 PM

## 2019-01-16 ENCOUNTER — Encounter: Payer: Self-pay | Admitting: Emergency Medicine

## 2019-01-16 ENCOUNTER — Other Ambulatory Visit: Payer: Self-pay

## 2019-01-16 ENCOUNTER — Emergency Department: Payer: PPO

## 2019-01-16 ENCOUNTER — Inpatient Hospital Stay
Admission: EM | Admit: 2019-01-16 | Discharge: 2019-01-19 | DRG: 754 | Disposition: A | Discharge status: Other Acute Inpatient Hospital | Payer: PPO | Attending: Internal Medicine | Admitting: Internal Medicine

## 2019-01-16 DIAGNOSIS — G4700 Insomnia, unspecified: Secondary | ICD-10-CM | POA: Diagnosis present

## 2019-01-16 DIAGNOSIS — K56609 Unspecified intestinal obstruction, unspecified as to partial versus complete obstruction: Secondary | ICD-10-CM | POA: Diagnosis not present

## 2019-01-16 DIAGNOSIS — E8881 Metabolic syndrome: Secondary | ICD-10-CM | POA: Diagnosis present

## 2019-01-16 DIAGNOSIS — Z79899 Other long term (current) drug therapy: Secondary | ICD-10-CM

## 2019-01-16 DIAGNOSIS — I10 Essential (primary) hypertension: Secondary | ICD-10-CM | POA: Diagnosis present

## 2019-01-16 DIAGNOSIS — Z90710 Acquired absence of both cervix and uterus: Secondary | ICD-10-CM | POA: Diagnosis not present

## 2019-01-16 DIAGNOSIS — K669 Disorder of peritoneum, unspecified: Secondary | ICD-10-CM | POA: Diagnosis not present

## 2019-01-16 DIAGNOSIS — Z9221 Personal history of antineoplastic chemotherapy: Secondary | ICD-10-CM

## 2019-01-16 DIAGNOSIS — K6389 Other specified diseases of intestine: Secondary | ICD-10-CM | POA: Diagnosis not present

## 2019-01-16 DIAGNOSIS — Z20828 Contact with and (suspected) exposure to other viral communicable diseases: Secondary | ICD-10-CM | POA: Diagnosis not present

## 2019-01-16 DIAGNOSIS — Z992 Dependence on renal dialysis: Secondary | ICD-10-CM

## 2019-01-16 DIAGNOSIS — R180 Malignant ascites: Secondary | ICD-10-CM | POA: Diagnosis present

## 2019-01-16 DIAGNOSIS — Z8543 Personal history of malignant neoplasm of ovary: Secondary | ICD-10-CM | POA: Diagnosis not present

## 2019-01-16 DIAGNOSIS — K5900 Constipation, unspecified: Secondary | ICD-10-CM | POA: Diagnosis not present

## 2019-01-16 DIAGNOSIS — I5032 Chronic diastolic (congestive) heart failure: Secondary | ICD-10-CM | POA: Diagnosis present

## 2019-01-16 DIAGNOSIS — C569 Malignant neoplasm of unspecified ovary: Principal | ICD-10-CM | POA: Diagnosis present

## 2019-01-16 DIAGNOSIS — Z515 Encounter for palliative care: Secondary | ICD-10-CM | POA: Diagnosis not present

## 2019-01-16 DIAGNOSIS — F411 Generalized anxiety disorder: Secondary | ICD-10-CM | POA: Diagnosis present

## 2019-01-16 DIAGNOSIS — I5030 Unspecified diastolic (congestive) heart failure: Secondary | ICD-10-CM | POA: Diagnosis not present

## 2019-01-16 DIAGNOSIS — E872 Acidosis: Secondary | ICD-10-CM | POA: Diagnosis not present

## 2019-01-16 DIAGNOSIS — I272 Pulmonary hypertension, unspecified: Secondary | ICD-10-CM | POA: Diagnosis not present

## 2019-01-16 DIAGNOSIS — Z87891 Personal history of nicotine dependence: Secondary | ICD-10-CM

## 2019-01-16 DIAGNOSIS — N2581 Secondary hyperparathyroidism of renal origin: Secondary | ICD-10-CM | POA: Diagnosis not present

## 2019-01-16 DIAGNOSIS — I480 Paroxysmal atrial fibrillation: Secondary | ICD-10-CM | POA: Diagnosis present

## 2019-01-16 DIAGNOSIS — J3089 Other allergic rhinitis: Secondary | ICD-10-CM | POA: Diagnosis not present

## 2019-01-16 DIAGNOSIS — R188 Other ascites: Secondary | ICD-10-CM | POA: Diagnosis not present

## 2019-01-16 DIAGNOSIS — E559 Vitamin D deficiency, unspecified: Secondary | ICD-10-CM | POA: Diagnosis present

## 2019-01-16 DIAGNOSIS — I132 Hypertensive heart and chronic kidney disease with heart failure and with stage 5 chronic kidney disease, or end stage renal disease: Secondary | ICD-10-CM | POA: Diagnosis present

## 2019-01-16 DIAGNOSIS — E032 Hypothyroidism due to medicaments and other exogenous substances: Secondary | ICD-10-CM | POA: Diagnosis present

## 2019-01-16 DIAGNOSIS — K5902 Outlet dysfunction constipation: Secondary | ICD-10-CM | POA: Diagnosis not present

## 2019-01-16 DIAGNOSIS — D631 Anemia in chronic kidney disease: Secondary | ICD-10-CM | POA: Diagnosis not present

## 2019-01-16 DIAGNOSIS — Z03818 Encounter for observation for suspected exposure to other biological agents ruled out: Secondary | ICD-10-CM | POA: Diagnosis not present

## 2019-01-16 DIAGNOSIS — K56691 Other complete intestinal obstruction: Secondary | ICD-10-CM | POA: Diagnosis not present

## 2019-01-16 DIAGNOSIS — Z85828 Personal history of other malignant neoplasm of skin: Secondary | ICD-10-CM | POA: Diagnosis not present

## 2019-01-16 DIAGNOSIS — Z7951 Long term (current) use of inhaled steroids: Secondary | ICD-10-CM

## 2019-01-16 DIAGNOSIS — N186 End stage renal disease: Secondary | ICD-10-CM | POA: Diagnosis not present

## 2019-01-16 DIAGNOSIS — K219 Gastro-esophageal reflux disease without esophagitis: Secondary | ICD-10-CM | POA: Diagnosis not present

## 2019-01-16 DIAGNOSIS — Z79890 Hormone replacement therapy (postmenopausal): Secondary | ICD-10-CM

## 2019-01-16 DIAGNOSIS — Z9049 Acquired absence of other specified parts of digestive tract: Secondary | ICD-10-CM

## 2019-01-16 DIAGNOSIS — Z66 Do not resuscitate: Secondary | ICD-10-CM | POA: Diagnosis present

## 2019-01-16 DIAGNOSIS — Z981 Arthrodesis status: Secondary | ICD-10-CM

## 2019-01-16 DIAGNOSIS — Z8349 Family history of other endocrine, nutritional and metabolic diseases: Secondary | ICD-10-CM

## 2019-01-16 DIAGNOSIS — Z7901 Long term (current) use of anticoagulants: Secondary | ICD-10-CM

## 2019-01-16 LAB — URINALYSIS, COMPLETE (UACMP) WITH MICROSCOPIC
Bacteria, UA: NONE SEEN
Bilirubin Urine: NEGATIVE
Glucose, UA: NEGATIVE mg/dL
Hgb urine dipstick: NEGATIVE
Ketones, ur: NEGATIVE mg/dL
Leukocytes,Ua: NEGATIVE
Nitrite: NEGATIVE
Protein, ur: NEGATIVE mg/dL
Specific Gravity, Urine: 1.016 (ref 1.005–1.030)
Squamous Epithelial / HPF: NONE SEEN (ref 0–5)
pH: 5 (ref 5.0–8.0)

## 2019-01-16 LAB — COMPREHENSIVE METABOLIC PANEL
ALT: 35 U/L (ref 0–44)
AST: 54 U/L — ABNORMAL HIGH (ref 15–41)
Albumin: 2.4 g/dL — ABNORMAL LOW (ref 3.5–5.0)
Alkaline Phosphatase: 211 U/L — ABNORMAL HIGH (ref 38–126)
Anion gap: 13 (ref 5–15)
BUN: 48 mg/dL — ABNORMAL HIGH (ref 8–23)
CO2: 25 mmol/L (ref 22–32)
Calcium: 8.8 mg/dL — ABNORMAL LOW (ref 8.9–10.3)
Chloride: 94 mmol/L — ABNORMAL LOW (ref 98–111)
Creatinine, Ser: 4.54 mg/dL — ABNORMAL HIGH (ref 0.44–1.00)
GFR calc Af Amer: 11 mL/min — ABNORMAL LOW (ref >60)
GFR calc non Af Amer: 10 mL/min — ABNORMAL LOW (ref >60)
Glucose, Bld: 90 mg/dL (ref 70–99)
Potassium: 4.2 mmol/L (ref 3.5–5.1)
Sodium: 132 mmol/L — ABNORMAL LOW (ref 135–145)
Total Bilirubin: 0.6 mg/dL (ref 0.3–1.2)
Total Protein: 6.3 g/dL — ABNORMAL LOW (ref 6.5–8.1)

## 2019-01-16 LAB — CBC
HCT: 37.4 % (ref 36.0–46.0)
Hemoglobin: 11.7 g/dL — ABNORMAL LOW (ref 12.0–15.0)
MCH: 29.3 pg (ref 26.0–34.0)
MCHC: 31.3 g/dL (ref 30.0–36.0)
MCV: 93.7 fL (ref 80.0–100.0)
Platelets: 292 10*3/uL (ref 150–400)
RBC: 3.99 MIL/uL (ref 3.87–5.11)
RDW: 15.6 % — ABNORMAL HIGH (ref 11.5–15.5)
WBC: 9.2 10*3/uL (ref 4.0–10.5)
nRBC: 0 % (ref 0.0–0.2)

## 2019-01-16 LAB — LACTIC ACID, PLASMA: Lactic Acid, Venous: 0.9 mmol/L (ref 0.5–1.9)

## 2019-01-16 MED ORDER — ACETAMINOPHEN 325 MG PO TABS: 650.0000 mg | ORAL_TABLET | Freq: Four times a day (QID) | ORAL | Status: DC | PRN

## 2019-01-16 MED ORDER — SODIUM CHLORIDE 0.9 % IV BOLUS: 1000.0000 mL | Freq: Once | INTRAVENOUS | Status: DC

## 2019-01-16 MED ORDER — ONDANSETRON HCL 4 MG/2ML IJ SOLN
4.0000 mg | Freq: Once | INTRAMUSCULAR | Status: AC
  Administered 2019-01-16: 4 mg via INTRAVENOUS
  Filled 2019-01-16: qty 2

## 2019-01-16 MED ORDER — ACETAMINOPHEN 650 MG RE SUPP
650.0000 mg | Freq: Four times a day (QID) | RECTAL | Status: DC | PRN
  Filled 2019-01-16: qty 1

## 2019-01-16 MED ORDER — ONDANSETRON HCL 4 MG/2ML IJ SOLN: 4.0000 mg | Freq: Four times a day (QID) | INTRAMUSCULAR | Status: DC | PRN

## 2019-01-16 MED ORDER — SORBITOL 70 % SOLN
960.0000 mL | TOPICAL_OIL | Freq: Once | ORAL | Status: AC
  Administered 2019-01-16: 960 mL via RECTAL
  Filled 2019-01-16: qty 473

## 2019-01-16 MED ORDER — PANTOPRAZOLE SODIUM 40 MG PO TBEC
40.0000 mg | DELAYED_RELEASE_TABLET | Freq: Every day | ORAL | Status: DC
  Administered 2019-01-17 – 2019-01-18 (×2): 40 mg via ORAL
  Filled 2019-01-16 (×2): qty 1

## 2019-01-16 MED ORDER — FENTANYL CITRATE (PF) 100 MCG/2ML IJ SOLN
50.0000 ug | Freq: Once | INTRAMUSCULAR | Status: AC
  Administered 2019-01-16: 50 ug via INTRAVENOUS
  Filled 2019-01-16: qty 2

## 2019-01-16 MED ORDER — LEVOTHYROXINE SODIUM 25 MCG PO TABS
25.0000 ug | ORAL_TABLET | Freq: Every day | ORAL | Status: DC
  Administered 2019-01-17 – 2019-01-19 (×3): 25 ug via ORAL
  Filled 2019-01-16 (×4): qty 1

## 2019-01-16 MED ORDER — ONDANSETRON HCL 4 MG PO TABS: 4.0000 mg | ORAL_TABLET | Freq: Four times a day (QID) | ORAL | Status: DC | PRN

## 2019-01-16 MED ORDER — PEG 3350-KCL-NA BICARB-NACL 420 G PO SOLR
4000.0000 mL | Freq: Once | ORAL | Status: AC
  Administered 2019-01-17: 4000 mL via ORAL
  Filled 2019-01-16: qty 4000

## 2019-01-16 MED ORDER — APIXABAN 5 MG PO TABS
5.0000 mg | ORAL_TABLET | Freq: Two times a day (BID) | ORAL | Status: DC
  Administered 2019-01-17 – 2019-01-18 (×5): 5 mg via ORAL
  Filled 2019-01-16 (×6): qty 1

## 2019-01-16 NOTE — ED Notes (Signed)
First Nurse Note: Pt to ED via POV for abdominal pain and constipation. Pt has not had BM since last Thursday.

## 2019-01-16 NOTE — H&P (Addendum)
[facility] NAME: Jessica Burgess    MR#:  [id number]  DATE OF BIRTH:  10-07-1955  DATE OF ADMISSION:  01/16/2019  PRIMARY CARE PHYSICIAN: Steele Sizer, MD   REQUESTING/REFERRING PHYSICIAN: Cinda Quest, MD  CHIEF COMPLAINT:   Chief Complaint  Patient presents with  . Constipation    HISTORY OF PRESENT ILLNESS:  Jessica Burgess  is a 63 y.o. female who presents with chief complaint as above.  Patient presents to the ED with a complaint of constipation.  She states that she has not had a bowel movement for more than 9 days.  She has significant abdominal pain with nausea and vomiting as well.  At home she has tried over-the-counter stool softener and promoting agents, and then had a prescription for lactulose as well, and also milk of magnesia.  None of this has produced a bowel movement.  On evaluation in the ED today she is found on imaging to have large stool burden throughout her entire colon.  She received multiple enemas in the ED without significant result.  Hospitalist were called for admission and further treatment  PAST MEDICAL HISTORY:   Past Medical History:  Diagnosis Date  . Allergic rhinitis, cause unspecified   . Anxiety state, unspecified   . Arthritis   . Asthma    only when sick   . Backache, unspecified   . Bronchitis    hx of when get sick  . Cancer ([place])    skin cancer , basal cell   . Cancer ([place]) 11/2016   ovarian  . Cervicalgia   . CHF (congestive heart failure) ([place])   . Complication of anesthesia   . Dermatophytosis of nail   . Dysmetabolic syndrome X   . Encounter for long-term (current) use of other medications   . Esophageal reflux   . Hypertension   . Insomnia, unspecified   . Leukocytosis, unspecified   . Migraine without aura, without mention of intractable migraine without mention of status migrainosus   . Other and unspecified hyperlipidemia   . Other malaise and fatigue   .  Overweight(278.02)   . Personal history of chemotherapy now   ovarian  . Renal insufficiency   . Spinal stenosis in cervical region   . Symptomatic menopausal or female climacteric states   . Unspecified disorder of skin and subcutaneous tissue   . Unspecified vitamin D deficiency      PAST SURGICAL HISTORY:   Past Surgical History:  Procedure Laterality Date  . ABDOMINAL HYSTERECTOMY    . ANTERIOR CERVICAL DECOMP/DISCECTOMY FUSION N/A 06/07/2015   Procedure: Cervical three - four and Cervical six- seven anterior cervical decompression with fusion interbody prosthesis plating and bonegraft;  Surgeon: Newman Pies, MD;  Location: [place] [facility];  Service: Neurosurgery;  Laterality: N/A;  C34 and C67 anterior cervical decompression with fusion interbody prosthesis plating and bonegraft  . BACK SURGERY     x2 Lower   . DIALYSIS/PERMA CATHETER INSERTION N/A 08/17/2018   Procedure: DIALYSIS/PERMA CATHETER INSERTION;  Surgeon: Algernon Huxley, MD;  Location: [place] CV LAB;  Service: Cardiovascular;  Laterality: N/A;  . EVACUATION OF CERVICAL HEMATOMA N/A 06/14/2015   Procedure: EVACUATION OF CERVICAL HEMATOMA;  Surgeon: Newman Pies, MD;  Location: [facility];  Service: Neurosurgery;  Laterality: N/A;  . IR GUIDED DRAIN W CATHETER PLACEMENT  01/01/2019  . NECK SURGERY     x3  . TONSILLECTOMY  SOCIAL HISTORY:   Social History   Tobacco Use  . Smoking status: Former Smoker    Packs/day: 1.50    Years: 20.00    Pack years: 30.00    Types: Cigarettes    Start date: 03/19/1979    Quit date: 08/25/1999    Years since quitting: 19.4  . Smokeless tobacco: Never Used  . Tobacco comment: smoking cessation materials not required  Substance Use Topics  . Alcohol use: Not Currently    Alcohol/week: 0.0 standard drinks     FAMILY HISTORY:   Family History  Problem Relation Age of Onset  . Depression Mother   . Migraines Mother   . Dementia Father   . Diabetes Father   .  Hyperlipidemia Father   . Hyperlipidemia Brother   . Hyperlipidemia Brother   . Breast cancer Paternal Aunt        53s     DRUG ALLERGIES:  No Known Allergies  MEDICATIONS AT HOME:   Prior to Admission medications   Medication Sig Start Date End Date Taking? Authorizing Provider  albuterol (PROVENTIL HFA;VENTOLIN HFA) 108 (90 Base) MCG/ACT inhaler Inhale 2 puffs into the lungs every 6 (six) hours as needed for wheezing or shortness of breath. 10/04/16   Hubbard Hartshorn, FNP  albuterol (PROVENTIL) (2.5 MG/3ML) 0.083% nebulizer solution Take 3 mLs (2.5 mg total) by nebulization every 6 (six) hours as needed for wheezing or shortness of breath. 08/18/18   Steele Sizer, MD  ALPRAZolam Duanne Moron) 0.25 MG tablet TAKE 1 TABLET(0.25 MG) BY MOUTH AT BEDTIME AS NEEDED FOR ANXIETY 12/10/18   Lloyd Huger, MD  amiodarone (PACERONE) 200 MG tablet Take 1 tablet (200 mg total) by mouth daily. 10/28/18   End, Harrell Gave, MD  apixaban (ELIQUIS) 5 MG TABS tablet Take 1 tablet (5 mg total) by mouth 2 (two) times daily. 12/09/18 01/08/19  End, Harrell Gave, MD  cetirizine (ZYRTEC) 10 MG tablet Take 10 mg by mouth daily.    [provider]  Cholecalciferol (VITAMIN D-1000 MAX ST) 1000 UNITS tablet Take 1,000 Units by mouth 2 (two) times daily.     [provider]  cyclobenzaprine (FLEXERIL) 10 MG tablet Take 1 tablet (10 mg total) by mouth every 8 (eight) hours as needed. 01/06/19   Borders, Kirt Boys, NP  DULoxetine (CYMBALTA) 60 MG capsule Take 1 capsule (60 mg total) by mouth daily. 12/23/18   Steele Sizer, MD  fentaNYL (DURAGESIC) 12 MCG/HR Place 1 patch onto the skin every 3 (three) days. 01/11/19   Jacquelin Hawking, NP  ferrous sulfate (IRON SUPPLEMENT) 325 (65 FE) MG tablet Take 1 tablet by mouth daily.    [provider]  fluticasone (FLONASE) 50 MCG/ACT nasal spray Place 2 sprays into both nostrils as needed. 06/11/18 11/27/18  Steele Sizer, MD  hydrALAZINE (APRESOLINE)  25 MG tablet Take 1 tablet (25 mg total) by mouth 3 (three) times daily. For BP if above 150/90 12/04/18   Steele Sizer, MD  hydrOXYzine (ATARAX/VISTARIL) 10 MG tablet Take 10 mg by mouth 3 (three) times daily as needed.    [provider]  ipratropium (ATROVENT HFA) 17 MCG/ACT inhaler Inhale 2 puffs into the lungs every 6 (six) hours as needed for wheezing.     [provider]  isosorbide mononitrate (IMDUR) 30 MG 24 hr tablet Take 1 tablet (30 mg total) by mouth daily. 12/04/18   Steele Sizer, MD  lactulose (CHRONULAC) 10 GM/15ML solution Take 15 mLs (10 g total) by mouth  2 (two) times daily as needed for mild constipation. 01/06/19   Borders, Kirt Boys, NP  levothyroxine (SYNTHROID) 25 MCG tablet Take 1 tablet (25 mcg total) by mouth daily before breakfast. 12/03/18   Lloyd Huger, MD  lidocaine (XYLOCAINE) 2 % solution Use as directed 15 mLs in the mouth or throat as needed for mouth pain. Patient not taking: Reported on 01/01/2019 11/28/17   Jacquelin Hawking, NP  losartan (COZAAR) 100 MG tablet Take 1 tablet (100 mg total) by mouth daily. 10/15/18 11/27/18  Pyreddy, Reatha Harps, MD  Lysine 1000 MG TABS Take 1,000 mg by mouth daily.     [provider]  Magnesium Oxide 400 (240 Mg) MG TABS Take 400 mg by mouth 2 (two) times daily.     [provider]  metoCLOPramide (REGLAN) 5 MG tablet Take 1 tablet (5 mg total) by mouth every 8 (eight) hours as needed for nausea or vomiting. 01/11/19 01/11/20  Jacquelin Hawking, NP  metoprolol succinate (TOPROL-XL) 50 MG 24 hr tablet Take 1 tablet (50 mg total) by mouth daily. Take with or immediately following a meal. 12/04/18 03/04/19  Sowles, Drue Stager, MD  mometasone-formoterol (DULERA) 200-5 MCG/ACT AERO Inhale 2 puffs into the lungs 2 (two) times daily.    [provider]  Multiple Vitamins-Minerals (MULTIVITAMIN PO) Take 1 tablet by mouth daily.     [provider]  Omega-3 Fatty Acids (FISH OIL) 1000 MG  CAPS Take 1,000 capsules by mouth 2 (two) times daily.     [provider]  ondansetron (ZOFRAN-ODT) 8 MG disintegrating tablet Take 1 tablet (8 mg total) by mouth every 8 (eight) hours as needed for nausea or vomiting. 07/20/18   Borders, Kirt Boys, NP  oxyCODONE-acetaminophen (PERCOCET) 10-325 MG tablet Take 1 tablet by mouth every 4 (four) hours as needed for pain. 01/01/19   Borders, Kirt Boys, NP  pantoprazole (PROTONIX) 40 MG tablet Take 1 tablet (40 mg total) by mouth daily. 01/11/19   Jacquelin Hawking, NP  polyethylene glycol powder (GLYCOLAX/MIRALAX) 17 GM/SCOOP powder Take 17 g by mouth daily as needed for mild constipation or moderate constipation.     [provider]  senna (SENOKOT) 8.6 MG TABS tablet Take 1-2 tablets (8.6-17.2 mg total) by mouth 2 (two) times daily. 01/06/19   Borders, Kirt Boys, NP  SUMAtriptan (IMITREX) 100 MG tablet Take 1 tablet (100 mg total) by mouth daily as needed for migraine. May repeat in 2 hours if headache persists or recurs. 12/23/18   Steele Sizer, MD  temazepam (RESTORIL) 30 MG capsule Take 1 capsule (30 mg total) by mouth at bedtime as needed for sleep. 12/16/18   Borders, Kirt Boys, NP  torsemide (DEMADEX) 20 MG tablet Take 2 tablets (40 mg total) by mouth 2 (two) times daily. 12/23/18 01/22/19  Steele Sizer, MD  vitamin B-12 (CYANOCOBALAMIN) 1000 MCG tablet Take 1,000 mcg by mouth daily.    [provider]  vitamin C (ASCORBIC ACID) 500 MG tablet Take 500 mg by mouth daily.    [provider]  prochlorperazine (COMPAZINE) 10 MG tablet Take 1 tablet (10 mg total) by mouth every 6 (six) hours as needed (Nausea or vomiting). 12/02/16 08/08/17  Lloyd Huger, MD    REVIEW OF SYSTEMS:  Review of Systems  Constitutional: Negative for chills, fever, malaise/fatigue and weight loss.  HENT: Negative for ear pain, hearing loss and tinnitus.   Eyes: Negative for blurred vision, double vision, pain and redness.   Respiratory:  Negative for cough, hemoptysis and shortness of breath.   Cardiovascular: Negative for chest pain, palpitations, orthopnea and leg swelling.  Gastrointestinal: Positive for abdominal pain, constipation, nausea and vomiting. Negative for diarrhea.  Genitourinary: Negative for dysuria, frequency and hematuria.  Musculoskeletal: Negative for back pain, joint pain and neck pain.  Skin:       No acne, rash, or lesions  Neurological: Negative for dizziness, tremors, focal weakness and weakness.  Endo/Heme/Allergies: Negative for polydipsia. Does not bruise/bleed easily.  Psychiatric/Behavioral: Negative for depression. The patient is not nervous/anxious and does not have insomnia.      VITAL SIGNS:   Vitals:   01/16/19 1250 01/16/19 1724 01/16/19 1800 01/16/19 1900  BP: 112/67 (!) 103/57 (!) 104/56 109/62  Pulse: 92 80 78 77  Resp: 18 16    Temp: 97.6 F (36.4 C)     TempSrc: Oral     SpO2: 95% 93% 91% 94%  Weight: 68 kg     Height: 5\' 2"  (1.575 m)      Wt Readings from Last 3 Encounters:  01/16/19 68 kg  01/05/19 68.8 kg  01/01/19 65.5 kg    PHYSICAL EXAMINATION:  Physical Exam  Vitals reviewed. Constitutional: She is oriented to person, place, and time. She appears well-developed and well-nourished. No distress.  HENT:  Head: Normocephalic and atraumatic.  Mouth/Throat: Oropharynx is clear and moist.  Eyes: Pupils are equal, round, and reactive to light. Conjunctivae and EOM are normal. No scleral icterus.  Neck: Normal range of motion. Neck supple. No JVD present. No thyromegaly present.  Cardiovascular: Normal rate, regular rhythm and intact distal pulses. Exam reveals no gallop and no friction rub.  No murmur heard. Respiratory: Effort normal and breath sounds normal. No respiratory distress. She has no wheezes. She has no rales.  GI: Soft. Bowel sounds are normal. She exhibits no distension. There is abdominal tenderness.  Musculoskeletal: Normal range of  motion.        General: No edema.     Comments: No arthritis, no gout  Lymphadenopathy:    She has no cervical adenopathy.  Neurological: She is alert and oriented to person, place, and time. No cranial nerve deficit.  No dysarthria, no aphasia  Skin: Skin is warm and dry. No rash noted. No erythema.  Psychiatric: She has a normal mood and affect. Her behavior is normal. Judgment and thought content normal.    LABORATORY PANEL:   CBC Recent Labs  Lab 01/16/19 1256  WBC 9.2  HGB 11.7*  HCT 37.4  PLT 292   ------------------------------------------------------------------------------------------------------------------  Chemistries  Recent Labs  Lab 01/16/19 1256  NA 132*  K 4.2  CL 94*  CO2 25  GLUCOSE 90  BUN 48*  CREATININE 4.54*  CALCIUM 8.8*  AST 54*  ALT 35  ALKPHOS 211*  BILITOT 0.6   ------------------------------------------------------------------------------------------------------------------  Cardiac Enzymes No results for input(s): TROPONINI in the last 168 hours. ------------------------------------------------------------------------------------------------------------------  RADIOLOGY:  Ct Abdomen Pelvis Wo Contrast  Result Date: 01/16/2019 CLINICAL DATA:  No bowel movement 9 days.  Stage IV ovarian cancer. EXAM: CT ABDOMEN AND PELVIS WITHOUT CONTRAST TECHNIQUE: Multidetector CT imaging of the abdomen and pelvis was performed following the standard protocol without IV contrast. COMPARISON:  November 06, 2018 FINDINGS: Lower chest: Bilateral pleural effusions persist but are smaller in the interval. There is fluid adjacent to the distal esophagus extending from the abdomen. Hepatobiliary: The patient is status post cholecystectomy. The liver is a otherwise unremarkable on this unenhanced study. Common  bile duct dilatation appears similar, likely due to previous cholecystectomy. Pancreas: The pancreas is poorly evaluated due to diffuse ascites and lack of  contrast. No obvious pancreatic abnormality identified. Spleen: Normal in size without focal abnormality. Adrenals/Urinary Tract: Adrenal glands are normal. No renal stones or obstruction identified. No ureteral stones are noted. The bladder is unremarkable. Stomach/Bowel: The stomach and small bowel are normal. Severe fecal loading extends from the cecum into the mid sigmoid colon. An underlying cause is not seen. No pneumatosis noted. The appendix is not visualized but there is no secondary evidence of appendicitis. Vascular/Lymphatic: Mild atherosclerotic changes seen in the abdominal aorta. No definite adenopathy. Reproductive: Status post hysterectomy. No adnexal masses. Other: Severe ascites throughout the abdomen is significantly worsened in the interval. The peritoneal drain appears to be in good position, terminating in the left upper quadrant. It is difficult to evaluate for peritoneal or omental disease given the lack of contrast and the presence of ascites. However, there does appear to be omental/peritoneal nodularity on the left such as on axial image 37, more prominent the interval. Musculoskeletal: No acute or significant osseous findings. IMPRESSION: 1. Severe fecal loading from the cecum through the mid sigmoid colon of uncertain etiology. 2. The bilateral pleural effusions are smaller in the interval but persist. 3. The amount of ascites is significantly increased in the interval. 4. Evaluation for omental/peritoneal metastases is limited due to lack of contrast. However, there appears to be omental/peritoneal disease on the left which is more prominent the interval. 5. Mild atherosclerotic change in the abdominal aorta. Electronically Signed   By: Dorise Bullion III M.D   On: 01/16/2019 16:21   Dg Abdomen 1 View  Result Date: 01/16/2019 CLINICAL DATA:  Constipation. EXAM: ABDOMEN - 1 VIEW COMPARISON:  January 05, 2019 FINDINGS: Severe fecal loading is seen throughout the length of the  colon. A peritoneal drainage catheters again identified, terminating in the left upper quadrant. Pleural effusions are noted. No other acute abnormalities. IMPRESSION: Severe fecal loading in the colon. Small pleural effusions. Stable peritoneal drainage catheter. Electronically Signed   By: Dorise Bullion III M.D   On: 01/16/2019 14:59    EKG:   Orders placed or performed in visit on 12/09/18  . EKG 12-Lead    IMPRESSION AND PLAN:  Principal Problem:   Constipation -we will order bowel prep tonight to see if this does not help her to produce a bowel movement.  Can use repeat enema if needed.  If she continues to have no bowel movement of any fashion after bowel prep, would consider GI consult for possible colonoscopy and disimpaction. Active Problems:   HTN (hypertension) -continue home dose antihypertensives   ESRD on dialysis [facility]) -nephrology consult for dialysis support   (HFpEF) heart failure with preserved ejection fraction ([place]) -continue home medications   Paroxysmal atrial fibrillation ([place]) - home meds including anticoagulation   Gastro-esophageal reflux disease without esophagitis - homes dose PPI   Hypothyroidism due to medication - home dose thyroid replacement  Chart review performed and case discussed with ED provider. Labs, imaging and/or ECG reviewed by provider and discussed with patient/family. Management plans discussed with the patient and/or family.  COVID-19 status: Pending  DVT PROPHYLAXIS: Systemic anticoagulation  GI PROPHYLAXIS:  PPI   ADMISSION STATUS: Inpatient    CODE STATUS: Full Code Status History    Date Active Date Inactive Code Status Order ID Comments User Context   10/08/2018 1049 10/14/2018 1823 Full Code [id number]  Rufina Falco  Achieng, NP ED   08/28/2018 1952 08/30/2018 1714 Full Code [id number]  Gorden Harms, MD Inpatient   08/06/2018 2355 08/18/2018 1650 Full Code [id number]  Mayer Camel, NP ED   Advance Care Planning Activity       TOTAL TIME TAKING CARE OF THIS PATIENT: 45 minutes.   This patient was evaluated in the context of the global COVID-19 pandemic, which necessitated consideration that the patient might be at risk for infection with the SARS-CoV-2 virus that causes COVID-19. Institutional protocols and algorithms that pertain to the evaluation of patients at risk for COVID-19 are in a state of rapid change based on information released by regulatory bodies including the CDC and federal and state organizations. These policies and algorithms were followed to the best of this provider's knowledge to date during the patient's care at this facility.  Ethlyn Daniels 01/16/2019, 9:29 PM  [facility]  Office  [phone]  CC: Primary care physician; Steele Sizer, MD  Note:  This document was prepared using Dragon voice recognition software and may include unintentional dictation errors.

## 2019-01-16 NOTE — ED Provider Notes (Signed)
[facility] Emergency Department Provider Note ____________________________________________   None    (approximate)  I have reviewed the triage vital signs and the nursing notes.   HISTORY  Chief Complaint Constipation  HPI Jessica Burgess is a 63 y.o. female who presents to the emergency department for treatment and evaluation of constipation. No bowel movement for 9 days.  Patient has not been able to tolerate any food or fluids for the past 24 hours. No active vomiting, but severe nausea. She is an ovarian cancer patient who's last treatment was in June. She is also a dialysis patient, but did not do dialysis today due to feeling so bad.   Past Medical History:  Diagnosis Date   Allergic rhinitis, cause unspecified    Anxiety state, unspecified    Arthritis    Asthma    only when sick    Backache, unspecified    Bronchitis    hx of when get sick   Cancer [facility])    skin cancer , basal cell    Cancer ([place]) 11/2016   ovarian   Cervicalgia    CHF (congestive heart failure) (HCC)    Complication of anesthesia    Dermatophytosis of nail    Dysmetabolic syndrome X    Encounter for long-term (current) use of other medications    Esophageal reflux    Hypertension    Insomnia, unspecified    Leukocytosis, unspecified    Migraine without aura, without mention of intractable migraine without mention of status migrainosus    Other and unspecified hyperlipidemia    Other malaise and fatigue    Overweight(278.02)    Personal history of chemotherapy now   ovarian   Renal insufficiency    Spinal stenosis in cervical region    Symptomatic menopausal or female climacteric states    Unspecified disorder of skin and subcutaneous tissue    Unspecified vitamin D deficiency     Patient Active Problem List   Diagnosis Date Noted   PONV (postoperative nausea and vomiting)    Acute on chronic heart failure with preserved  ejection fraction (HFpEF) (HCC)    Paroxysmal atrial fibrillation ([place])    Sepsis ([place]) 10/08/2018   CHF (congestive heart failure) ([place]) 08/28/2018   Acute respiratory failure ([place])    Anxiety 08/14/2018   ESRD on dialysis [facility])    Acute CHF (congestive heart failure) ([place]) 08/07/2018   Acute CHF ([place]) 08/06/2018   Hypertension due to drug 10/28/2017   Mucositis due to chemotherapy 09/02/2017   Goals of care, counseling/discussion 08/08/2017   Hypothyroidism due to medication 07/28/2017   Genetic testing 12/18/2016   Migraines 12/01/2016   Postoperative seroma of subcutaneous tissue after non-dermatologic procedure 12/01/2016   Transaminitis 12/01/2016   Anemia associated with acute blood loss 11/20/2016   Ovarian cancer, unspecified laterality ([place]) 11/12/2016   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   Examination of participant in clinical trial 11/01/2016   Carcinomatosis ([place]) 11/01/2016   Chronic right-sided low back pain with right-sided sciatica 02/02/2016   Status post insertion of dialysis catheter ([place]) 08/01/2015   Hematoma 06/14/2015   Perennial allergic rhinitis 05/05/2015   Generalized anxiety disorder 02/03/2015   Back pain, chronic 08/25/2014   Insomnia, persistent 08/25/2014   Chronic cervical pain 08/25/2014   Major depression, chronic ([place]) 08/25/2014   Dyslipidemia 08/25/2014   Gastro-esophageal reflux disease without esophagitis 08/25/2014   H/O high risk medication treatment 08/25/2014   Blood glucose elevated 08/25/2014  Migraine without aura and without status migrainosus, not intractable 08/25/2014   Climacteric 23/53/6144   Dysmetabolic syndrome 31/54/0086   Fungal infection of toenail 08/25/2014   Obesity (BMI 30.0-34.9) 08/25/2014   Vitamin D deficiency 08/25/2014   Engages in travel abroad 08/25/2014   Cervical radiculitis 07/23/2013   Cervical disc disorder with  radiculopathy 04/28/2013    Past Surgical History:  Procedure Laterality Date   ABDOMINAL HYSTERECTOMY     ANTERIOR CERVICAL DECOMP/DISCECTOMY FUSION N/A 06/07/2015   Procedure: Cervical three - four and Cervical six- seven anterior cervical decompression with fusion interbody prosthesis plating and bonegraft;  Surgeon: Newman Pies, MD;  Location: [place] [facility];  Service: Neurosurgery;  Laterality: N/A;  C34 and C67 anterior cervical decompression with fusion interbody prosthesis plating and bonegraft   BACK SURGERY     x2 Lower    DIALYSIS/PERMA CATHETER INSERTION N/A 08/17/2018   Procedure: DIALYSIS/PERMA CATHETER INSERTION;  Surgeon: Algernon Huxley, MD;  Location: [place] CV LAB;  Service: Cardiovascular;  Laterality: N/A;   EVACUATION OF CERVICAL HEMATOMA N/A 06/14/2015   Procedure: EVACUATION OF CERVICAL HEMATOMA;  Surgeon: Newman Pies, MD;  Location: [place] [facility];  Service: Neurosurgery;  Laterality: N/A;   IR GUIDED DRAIN W CATHETER PLACEMENT  01/01/2019   NECK SURGERY     x3   TONSILLECTOMY      Prior to Admission medications   Medication Sig Start Date End Date Taking? Authorizing Provider  albuterol (PROVENTIL HFA;VENTOLIN HFA) 108 (90 Base) MCG/ACT inhaler Inhale 2 puffs into the lungs every 6 (six) hours as needed for wheezing or shortness of breath. 10/04/16   Hubbard Hartshorn, FNP  albuterol (PROVENTIL) (2.5 MG/3ML) 0.083% nebulizer solution Take 3 mLs (2.5 mg total) by nebulization every 6 (six) hours as needed for wheezing or shortness of breath. 08/18/18   Steele Sizer, MD  ALPRAZolam Duanne Moron) 0.25 MG tablet TAKE 1 TABLET(0.25 MG) BY MOUTH AT BEDTIME AS NEEDED FOR ANXIETY 12/10/18   Lloyd Huger, MD  amiodarone (PACERONE) 200 MG tablet Take 1 tablet (200 mg total) by mouth daily. 10/28/18   End, Harrell Gave, MD  apixaban (ELIQUIS) 5 MG TABS tablet Take 1 tablet (5 mg total) by mouth 2 (two) times daily. 12/09/18 01/08/19  End, Harrell Gave, MD  cetirizine  (ZYRTEC) 10 MG tablet Take 10 mg by mouth daily.    [provider]  Cholecalciferol (VITAMIN D-1000 MAX ST) 1000 UNITS tablet Take 1,000 Units by mouth 2 (two) times daily.     [provider]  cyclobenzaprine (FLEXERIL) 10 MG tablet Take 1 tablet (10 mg total) by mouth every 8 (eight) hours as needed. 01/06/19   Borders, Kirt Boys, NP  DULoxetine (CYMBALTA) 60 MG capsule Take 1 capsule (60 mg total) by mouth daily. 12/23/18   Steele Sizer, MD  fentaNYL (DURAGESIC) 12 MCG/HR Place 1 patch onto the skin every 3 (three) days. 01/11/19   Jacquelin Hawking, NP  ferrous sulfate (IRON SUPPLEMENT) 325 (65 FE) MG tablet Take 1 tablet by mouth daily.    [provider]  fluticasone (FLONASE) 50 MCG/ACT nasal spray Place 2 sprays into both nostrils as needed. 06/11/18 11/27/18  Steele Sizer, MD  hydrALAZINE (APRESOLINE) 25 MG tablet Take 1 tablet (25 mg total) by mouth 3 (three) times daily. For BP if above 150/90 12/04/18   Steele Sizer, MD  hydrOXYzine (ATARAX/VISTARIL) 10 MG tablet Take 10 mg by mouth 3 (three) times daily as needed.    [provider]  ipratropium (  ATROVENT HFA) 17 MCG/ACT inhaler Inhale 2 puffs into the lungs every 6 (six) hours as needed for wheezing.     [provider]  isosorbide mononitrate (IMDUR) 30 MG 24 hr tablet Take 1 tablet (30 mg total) by mouth daily. 12/04/18   Steele Sizer, MD  lactulose (CHRONULAC) 10 GM/15ML solution Take 15 mLs (10 g total) by mouth 2 (two) times daily as needed for mild constipation. 01/06/19   Borders, Kirt Boys, NP  levothyroxine (SYNTHROID) 25 MCG tablet Take 1 tablet (25 mcg total) by mouth daily before breakfast. 12/03/18   Lloyd Huger, MD  lidocaine (XYLOCAINE) 2 % solution Use as directed 15 mLs in the mouth or throat as needed for mouth pain. Patient not taking: Reported on 01/01/2019 11/28/17   Jacquelin Hawking, NP  losartan (COZAAR) 100 MG tablet Take 1 tablet (100 mg total) by mouth  daily. 10/15/18 11/27/18  Pyreddy, Reatha Harps, MD  Lysine 1000 MG TABS Take 1,000 mg by mouth daily.     [provider]  Magnesium Oxide 400 (240 Mg) MG TABS Take 400 mg by mouth 2 (two) times daily.     [provider]  metoCLOPramide (REGLAN) 5 MG tablet Take 1 tablet (5 mg total) by mouth every 8 (eight) hours as needed for nausea or vomiting. 01/11/19 01/11/20  Jacquelin Hawking, NP  metoprolol succinate (TOPROL-XL) 50 MG 24 hr tablet Take 1 tablet (50 mg total) by mouth daily. Take with or immediately following a meal. 12/04/18 03/04/19  Sowles, Drue Stager, MD  mometasone-formoterol (DULERA) 200-5 MCG/ACT AERO Inhale 2 puffs into the lungs 2 (two) times daily.    [provider]  Multiple Vitamins-Minerals (MULTIVITAMIN PO) Take 1 tablet by mouth daily.     [provider]  Omega-3 Fatty Acids (FISH OIL) 1000 MG CAPS Take 1,000 capsules by mouth 2 (two) times daily.     [provider]  ondansetron (ZOFRAN-ODT) 8 MG disintegrating tablet Take 1 tablet (8 mg total) by mouth every 8 (eight) hours as needed for nausea or vomiting. 07/20/18   Borders, Kirt Boys, NP  oxyCODONE-acetaminophen (PERCOCET) 10-325 MG tablet Take 1 tablet by mouth every 4 (four) hours as needed for pain. 01/01/19   Borders, Kirt Boys, NP  pantoprazole (PROTONIX) 40 MG tablet Take 1 tablet (40 mg total) by mouth daily. 01/11/19   Jacquelin Hawking, NP  polyethylene glycol powder (GLYCOLAX/MIRALAX) 17 GM/SCOOP powder Take 17 g by mouth daily as needed for mild constipation or moderate constipation.     [provider]  senna (SENOKOT) 8.6 MG TABS tablet Take 1-2 tablets (8.6-17.2 mg total) by mouth 2 (two) times daily. 01/06/19   Borders, Kirt Boys, NP  SUMAtriptan (IMITREX) 100 MG tablet Take 1 tablet (100 mg total) by mouth daily as needed for migraine. May repeat in 2 hours if headache persists or recurs. 12/23/18   Steele Sizer, MD  temazepam (RESTORIL) 30 MG capsule Take 1 capsule  (30 mg total) by mouth at bedtime as needed for sleep. 12/16/18   Borders, Kirt Boys, NP  torsemide (DEMADEX) 20 MG tablet Take 2 tablets (40 mg total) by mouth 2 (two) times daily. 12/23/18 01/22/19  Steele Sizer, MD  vitamin B-12 (CYANOCOBALAMIN) 1000 MCG tablet Take 1,000 mcg by mouth daily.    [provider]  vitamin C (ASCORBIC ACID) 500 MG tablet Take 500 mg by mouth daily.    [provider]  prochlorperazine (COMPAZINE) 10 MG tablet Take 1 tablet (10  mg total) by mouth every 6 (six) hours as needed (Nausea or vomiting). 12/02/16 08/08/17  Lloyd Huger, MD    Allergies Patient has no known allergies.  Family History  Problem Relation Age of Onset   Depression Mother    Migraines Mother    Dementia Father    Diabetes Father    Hyperlipidemia Father    Hyperlipidemia Brother    Hyperlipidemia Brother    Breast cancer Paternal Aunt        32s    Social History Social History   Tobacco Use   Smoking status: Former Smoker    Packs/day: 1.50    Years: 20.00    Pack years: 30.00    Types: Cigarettes    Start date: 03/19/1979    Quit date: 08/25/1999    Years since quitting: 19.4   Smokeless tobacco: Never Used   Tobacco comment: smoking cessation materials not required  Substance Use Topics   Alcohol use: Not Currently    Alcohol/week: 0.0 standard drinks   Drug use: No    Review of Systems  Constitutional: No fever/chills Eyes: No visual changes. ENT: No sore throat. Cardiovascular: Denies chest pain. Respiratory: Denies shortness of breath. Gastrointestinal: Positive for abdominal pain.  Positive for nausea, no vomiting.  No diarrhea.  Positive for constipation. Genitourinary: Negative for dysuria. Musculoskeletal: Negative for back pain. Skin: Negative for rash. Neurological: Negative for headaches, focal weakness or numbness. ___________________________________________   PHYSICAL EXAM:  VITAL SIGNS: ED Triage Vitals  [01/16/19 1250]  Enc Vitals Group     BP 112/67     Pulse Rate 92     Resp 18     Temp 97.6 F (36.4 C)     Temp Source Oral     SpO2 95 %     Weight 150 lb (68 kg)     Height 5\' 2"  (1.575 m)     Head Circumference      Peak Flow      Pain Score 6     Pain Loc      Pain Edu?      Excl. in [place]?     Constitutional: Alert and oriented. Well appearing and in no acute distress. Eyes: Conjunctivae are normal. PERRL. EOMI. Head: Atraumatic. Nose: No congestion/rhinnorhea. Mouth/Throat: Mucous membranes are moist.  Oropharynx non-erythematous. Neck: No stridor.   Hematological/Lymphatic/Immunilogical: No cervical lymphadenopathy. Cardiovascular: Normal rate, regular rhythm. Grossly normal heart sounds.  Good peripheral circulation. Respiratory: Normal respiratory effort.  No retractions. Lungs CTAB. Gastrointestinal: Distended. No abdominal bruits. Genitourinary:  Musculoskeletal: No lower extremity tenderness nor edema.  No joint effusions. Neurologic:  Normal speech and language. No gross focal neurologic deficits are appreciated. No gait instability. Skin:  Skin is warm, dry and intact. No rash noted. Psychiatric: Mood and affect are normal. Speech and behavior are normal.  ____________________________________________   LABS (all labs ordered are listed, but only abnormal results are displayed)  Labs Reviewed  COMPREHENSIVE METABOLIC PANEL - Abnormal; Notable for the following components:      Result Value   Sodium 132 (*)    Chloride 94 (*)    BUN 48 (*)    Creatinine, Ser 4.54 (*)    Calcium 8.8 (*)    Total Protein 6.3 (*)    Albumin 2.4 (*)    AST 54 (*)    Alkaline Phosphatase 211 (*)    GFR calc non Af Amer 10 (*)    GFR calc Af Amer 11 (*)  All other components within normal limits  CBC - Abnormal; Notable for the following components:   Hemoglobin 11.7 (*)    RDW 15.6 (*)    All other components within normal limits  URINALYSIS, COMPLETE (UACMP) WITH  MICROSCOPIC - Abnormal; Notable for the following components:   Color, Urine YELLOW (*)    APPearance HAZY (*)    All other components within normal limits  LACTIC ACID, PLASMA   ____________________________________________  EKG  Not indicated. ____________________________________________  RADIOLOGY  ED MD interpretation:    Significant fecal loading from the cecum to the mid sigmoid colon.  Official radiology report(s): Ct Abdomen Pelvis Wo Contrast  Result Date: 01/16/2019 CLINICAL DATA:  No bowel movement 9 days.  Stage IV ovarian cancer. EXAM: CT ABDOMEN AND PELVIS WITHOUT CONTRAST TECHNIQUE: Multidetector CT imaging of the abdomen and pelvis was performed following the standard protocol without IV contrast. COMPARISON:  November 06, 2018 FINDINGS: Lower chest: Bilateral pleural effusions persist but are smaller in the interval. There is fluid adjacent to the distal esophagus extending from the abdomen. Hepatobiliary: The patient is status post cholecystectomy. The liver is a otherwise unremarkable on this unenhanced study. Common bile duct dilatation appears similar, likely due to previous cholecystectomy. Pancreas: The pancreas is poorly evaluated due to diffuse ascites and lack of contrast. No obvious pancreatic abnormality identified. Spleen: Normal in size without focal abnormality. Adrenals/Urinary Tract: Adrenal glands are normal. No renal stones or obstruction identified. No ureteral stones are noted. The bladder is unremarkable. Stomach/Bowel: The stomach and small bowel are normal. Severe fecal loading extends from the cecum into the mid sigmoid colon. An underlying cause is not seen. No pneumatosis noted. The appendix is not visualized but there is no secondary evidence of appendicitis. Vascular/Lymphatic: Mild atherosclerotic changes seen in the abdominal aorta. No definite adenopathy. Reproductive: Status post hysterectomy. No adnexal masses. Other: Severe ascites throughout the  abdomen is significantly worsened in the interval. The peritoneal drain appears to be in good position, terminating in the left upper quadrant. It is difficult to evaluate for peritoneal or omental disease given the lack of contrast and the presence of ascites. However, there does appear to be omental/peritoneal nodularity on the left such as on axial image 37, more prominent the interval. Musculoskeletal: No acute or significant osseous findings. IMPRESSION: 1. Severe fecal loading from the cecum through the mid sigmoid colon of uncertain etiology. 2. The bilateral pleural effusions are smaller in the interval but persist. 3. The amount of ascites is significantly increased in the interval. 4. Evaluation for omental/peritoneal metastases is limited due to lack of contrast. However, there appears to be omental/peritoneal disease on the left which is more prominent the interval. 5. Mild atherosclerotic change in the abdominal aorta. Electronically Signed   By: Dorise Bullion III M.D   On: 01/16/2019 16:21   Dg Abdomen 1 View  Result Date: 01/16/2019 CLINICAL DATA:  Constipation. EXAM: ABDOMEN - 1 VIEW COMPARISON:  January 05, 2019 FINDINGS: Severe fecal loading is seen throughout the length of the colon. A peritoneal drainage catheters again identified, terminating in the left upper quadrant. Pleural effusions are noted. No other acute abnormalities. IMPRESSION: Severe fecal loading in the colon. Small pleural effusions. Stable peritoneal drainage catheter. Electronically Signed   By: Dorise Bullion III M.D   On: 01/16/2019 14:59    ____________________________________________   PROCEDURES  Procedure(s) performed (including Critical Care):  Procedures  ____________________________________________   INITIAL IMPRESSION / ASSESSMENT AND PLAN  63 year old female presenting to the emergency department for constipation for the past 9 days.  No relief with over-the-counter medications.  No active  vomiting but she does not have any appetite and feels very nauseated.  See HPI for further details.  DIFFERENTIAL DIAGNOSIS  Constipation, small bowel obstruction, intra abdominal adhesions, metastatic cancer from ovarian source.  ED COURSE  ----------------------------------------- 3:17 PM on 01/16/2019 -----------------------------------------  After speaking with Dr. Candiss Norse who is on-call for nephrology, CT of the abdomen and pelvis will be done without IV contrast initially and if there is a finding that is not well visualized without the contrast, will repeat scan with contrast.  Patient states that she will not be able to tolerate any oral contrast due to nausea.   ----------------------------------------- 4:30 PM on 01/16/2019 -----------------------------------------  CT of the abdomen and pelvis without contrast does not show a small bowel obstruction.  She does have a significant stool burden.  Smog enema has been ordered.  Results discussed with the patient and her family.  ----------------------------------------- 6:30 PM on 01/16/2019 -----------------------------------------  Continue to await enema from pharmacy.  Care transitioned to Dr. Cinda Quest who will follow the patient to disposition. ____________________________________________   FINAL CLINICAL IMPRESSION(S) / ED DIAGNOSES  Final diagnoses:  Constipation, unspecified constipation type     ED Discharge Orders    None       Note:  This document was prepared using Dragon voice recognition software and may include unintentional dictation errors.   Victorino Dike, FNP 01/16/19 2012    Nena Polio, MD 01/16/19 2351

## 2019-01-16 NOTE — ED Triage Notes (Addendum)
Pt here for constipation.  No bowel movement in 9 days.  Pt given 2 meds by [facility] center and not working.  Denies "full on " vomiting but feels something coming up in her throat.  No imaging has been done. Stage 4 ovarian CA but stopped treatments in June.   No hx SBO

## 2019-01-16 NOTE — ED Provider Notes (Signed)
No results with enema.  We will try getting her in the hospital here in the ER overnight and using some MiraLAX or other p.o. prep to see if we can facilitate her colon emptying.   Nena Polio, MD 01/16/19 [phone]

## 2019-01-17 ENCOUNTER — Other Ambulatory Visit: Payer: Self-pay

## 2019-01-17 DIAGNOSIS — C569 Malignant neoplasm of unspecified ovary: Principal | ICD-10-CM

## 2019-01-17 DIAGNOSIS — R188 Other ascites: Secondary | ICD-10-CM | POA: Diagnosis present

## 2019-01-17 DIAGNOSIS — E032 Hypothyroidism due to medicaments and other exogenous substances: Secondary | ICD-10-CM

## 2019-01-17 DIAGNOSIS — I10 Essential (primary) hypertension: Secondary | ICD-10-CM

## 2019-01-17 DIAGNOSIS — K669 Disorder of peritoneum, unspecified: Secondary | ICD-10-CM | POA: Diagnosis present

## 2019-01-17 DIAGNOSIS — I480 Paroxysmal atrial fibrillation: Secondary | ICD-10-CM

## 2019-01-17 DIAGNOSIS — R180 Malignant ascites: Secondary | ICD-10-CM | POA: Diagnosis present

## 2019-01-17 DIAGNOSIS — K5900 Constipation, unspecified: Secondary | ICD-10-CM

## 2019-01-17 LAB — BASIC METABOLIC PANEL
Anion gap: 10 (ref 5–15)
BUN: 54 mg/dL — ABNORMAL HIGH (ref 8–23)
CO2: 28 mmol/L (ref 22–32)
Calcium: 8.6 mg/dL — ABNORMAL LOW (ref 8.9–10.3)
Chloride: 94 mmol/L — ABNORMAL LOW (ref 98–111)
Creatinine, Ser: 4.8 mg/dL — ABNORMAL HIGH (ref 0.44–1.00)
GFR calc Af Amer: 10 mL/min — ABNORMAL LOW (ref >60)
GFR calc non Af Amer: 9 mL/min — ABNORMAL LOW (ref >60)
Glucose, Bld: 80 mg/dL (ref 70–99)
Potassium: 3.9 mmol/L (ref 3.5–5.1)
Sodium: 132 mmol/L — ABNORMAL LOW (ref 135–145)

## 2019-01-17 LAB — CBC
HCT: 35.4 % — ABNORMAL LOW (ref 36.0–46.0)
Hemoglobin: 10.8 g/dL — ABNORMAL LOW (ref 12.0–15.0)
MCH: 28.5 pg (ref 26.0–34.0)
MCHC: 30.5 g/dL (ref 30.0–36.0)
MCV: 93.4 fL (ref 80.0–100.0)
Platelets: 344 10*3/uL (ref 150–400)
RBC: 3.79 MIL/uL — ABNORMAL LOW (ref 3.87–5.11)
RDW: 15.8 % — ABNORMAL HIGH (ref 11.5–15.5)
WBC: 8 10*3/uL (ref 4.0–10.5)
nRBC: 0 % (ref 0.0–0.2)

## 2019-01-17 MED ORDER — LOSARTAN POTASSIUM 50 MG PO TABS
100.0000 mg | ORAL_TABLET | Freq: Every day | ORAL | Status: DC
  Administered 2019-01-17 – 2019-01-18 (×2): 100 mg via ORAL
  Filled 2019-01-17 (×2): qty 2

## 2019-01-17 MED ORDER — CHLORHEXIDINE GLUCONATE CLOTH 2 % EX PADS
6.0000 | MEDICATED_PAD | Freq: Every day | CUTANEOUS | Status: DC
  Administered 2019-01-18 – 2019-01-19 (×2): 6 via TOPICAL
  Filled 2019-01-17: qty 6

## 2019-01-17 MED ORDER — HYDRALAZINE HCL 50 MG PO TABS
25.0000 mg | ORAL_TABLET | Freq: Three times a day (TID) | ORAL | Status: DC
  Administered 2019-01-17 – 2019-01-18 (×3): 25 mg via ORAL
  Filled 2019-01-17 (×3): qty 1

## 2019-01-17 MED ORDER — TEMAZEPAM 7.5 MG PO CAPS
30.0000 mg | ORAL_CAPSULE | Freq: Every evening | ORAL | Status: DC | PRN
  Administered 2019-01-17 – 2019-01-18 (×2): 30 mg via ORAL
  Filled 2019-01-17 (×3): qty 4

## 2019-01-17 MED ORDER — TEMAZEPAM 30 MG PO CAPS: 30.0000 mg | ORAL_CAPSULE | Freq: Every evening | ORAL | Status: DC | PRN

## 2019-01-17 MED ORDER — OXYCODONE-ACETAMINOPHEN 5-325 MG PO TABS
1.0000 | ORAL_TABLET | ORAL | Status: DC | PRN
  Administered 2019-01-17 – 2019-01-18 (×4): 1 via ORAL
  Filled 2019-01-17 (×5): qty 1

## 2019-01-17 MED ORDER — AMIODARONE HCL 200 MG PO TABS
200.0000 mg | ORAL_TABLET | Freq: Every day | ORAL | Status: DC
  Administered 2019-01-17 – 2019-01-18 (×2): 200 mg via ORAL
  Filled 2019-01-17 (×2): qty 1

## 2019-01-17 MED ORDER — TEMAZEPAM 7.5 MG PO CAPS
30.0000 mg | ORAL_CAPSULE | Freq: Every evening | ORAL | Status: DC | PRN
  Administered 2019-01-17: 30 mg via ORAL
  Filled 2019-01-17: qty 1
  Filled 2019-01-17 (×2): qty 4

## 2019-01-17 NOTE — Consult Note (Signed)
[place]  [phone] [phone]  ID: Jessica Burgess OB: 12-20-1955  MR#: [id number]  WLN#:[id number]  Patient Care Team: Steele Sizer, MD as PCP - General (Family Medicine) End, Harrell Gave, MD as PCP - Cardiology (Cardiology) Lloyd Huger, MD as Consulting Physician (Oncology) Mellody Drown, MD as Consulting Physician (Obstetrics and Gynecology) Cathi Roan, [facility] (Pharmacist) Clent Jacks, RN as Registered Nurse  CHIEF COMPLAINT: Progressive ovarian cancer, severe constipation, end-stage renal disease.  INTERVAL HISTORY: Patient is a 63 year old female with a history of progressive ovarian cancer that was last evaluated in the cancer center on November 11, 2018.  At that point she elected to discontinue all treatments, but was not yet ready to enroll in hospice.  Patient states she has been having problems with constipation for the past several weeks, but then her abdominal pain became significantly worse prompting a visit to the emergency room.  She has chronic weakness and fatigue and states she has "good days and bad days, but otherwise feels well.  She has no neurologic complaints.  She denies any recent fevers or illnesses.  She has a fair appetite, but denies weight loss.  She has no chest pain, shortness of breath, cough, or hemoptysis.  She has no urinary complaints.  Patient offers no further specific complaints today.  REVIEW OF SYSTEMS:   Review of Systems  Constitutional: Positive for malaise/fatigue. Negative for fever and weight loss.  Respiratory: Negative.  Negative for cough, hemoptysis and shortness of breath.   Cardiovascular: Negative.  Negative for chest pain and leg swelling.  Gastrointestinal: Positive for abdominal pain and constipation.  Genitourinary: Negative.  Negative for dysuria.  Musculoskeletal: Negative.  Negative for back pain.  Skin: Negative.  Negative for rash.  Neurological: Positive  for weakness. Negative for dizziness, focal weakness and headaches.  Psychiatric/Behavioral: Negative.  The patient is not nervous/anxious.     As per HPI. Otherwise, a complete review of systems is negative.  PAST MEDICAL HISTORY: Past Medical History:  Diagnosis Date   Allergic rhinitis, cause unspecified    Anxiety state, unspecified    Arthritis    Asthma    only when sick    Backache, unspecified    Bronchitis    hx of when get sick   Cancer [facility])    skin cancer , basal cell    Cancer ([place]) 11/2016   ovarian   Cervicalgia    CHF (congestive heart failure) (HCC)    Complication of anesthesia    Dermatophytosis of nail    Dysmetabolic syndrome X    Encounter for long-term (current) use of other medications    Esophageal reflux    Hypertension    Insomnia, unspecified    Leukocytosis, unspecified    Migraine without aura, without mention of intractable migraine without mention of status migrainosus    Other and unspecified hyperlipidemia    Other malaise and fatigue    Overweight(278.02)    Personal history of chemotherapy now   ovarian   Renal insufficiency    Spinal stenosis in cervical region    Symptomatic menopausal or female climacteric states    Unspecified disorder of skin and subcutaneous tissue    Unspecified vitamin D deficiency     PAST SURGICAL HISTORY: Past Surgical History:  Procedure Laterality Date   ABDOMINAL HYSTERECTOMY     ANTERIOR CERVICAL DECOMP/DISCECTOMY FUSION N/A 06/07/2015   Procedure: Cervical three - four and Cervical six- seven anterior cervical decompression with fusion interbody  prosthesis plating and bonegraft;  Surgeon: Newman Pies, MD;  Location: [place] [facility];  Service: Neurosurgery;  Laterality: N/A;  C34 and C67 anterior cervical decompression with fusion interbody prosthesis plating and bonegraft   BACK SURGERY     x2 Lower    DIALYSIS/PERMA CATHETER INSERTION N/A 08/17/2018   Procedure:  DIALYSIS/PERMA CATHETER INSERTION;  Surgeon: Algernon Huxley, MD;  Location: [place] CV LAB;  Service: Cardiovascular;  Laterality: N/A;   EVACUATION OF CERVICAL HEMATOMA N/A 06/14/2015   Procedure: EVACUATION OF CERVICAL HEMATOMA;  Surgeon: Newman Pies, MD;  Location: [place] [facility];  Service: Neurosurgery;  Laterality: N/A;   IR GUIDED DRAIN W CATHETER PLACEMENT  01/01/2019   NECK SURGERY     x3   TONSILLECTOMY      FAMILY HISTORY: Family History  Problem Relation Age of Onset   Depression Mother    Migraines Mother    Dementia Father    Diabetes Father    Hyperlipidemia Father    Hyperlipidemia Brother    Hyperlipidemia Brother    Breast cancer Paternal Aunt        51s    ADVANCED DIRECTIVES (Y/N):  @ADVDIR @  HEALTH MAINTENANCE: Social History   Tobacco Use   Smoking status: Former Smoker    Packs/day: 1.50    Years: 20.00    Pack years: 30.00    Types: Cigarettes    Start date: 03/19/1979    Quit date: 08/25/1999    Years since quitting: 19.4   Smokeless tobacco: Never Used   Tobacco comment: smoking cessation materials not required  Substance Use Topics   Alcohol use: Not Currently    Alcohol/week: 0.0 standard drinks   Drug use: No     Colonoscopy:  PAP:  Bone density:  Lipid panel:  No Known Allergies  Current Facility-Administered Medications  Medication Dose Route Frequency Provider Last Rate Last Dose   acetaminophen (TYLENOL) tablet 650 mg  650 mg Oral Q6H PRN Lance Coon, MD       Or   acetaminophen (TYLENOL) suppository 650 mg  650 mg Rectal Q6H PRN Lance Coon, MD       apixaban Arne [facility]) tablet 5 mg  5 mg Oral BID Lance Coon, MD   5 mg at 01/17/19 1914   levothyroxine (SYNTHROID) tablet 25 mcg  25 mcg Oral QAC breakfast Lance Coon, MD   25 mcg at 01/17/19 1019   ondansetron (ZOFRAN) tablet 4 mg  4 mg Oral Q6H PRN Lance Coon, MD       Or   ondansetron [facility]) injection 4 mg  4 mg Intravenous Q6H PRN  Lance Coon, MD       oxyCODONE-acetaminophen (PERCOCET/ROXICET) 5-325 MG per tablet 1 tablet  1 tablet Oral Q4H PRN Lavina Hamman, MD   1 tablet at 01/17/19 0143   pantoprazole (PROTONIX) EC tablet 40 mg  40 mg Oral Daily Lance Coon, MD   40 mg at 01/17/19 0956   temazepam (RESTORIL) capsule 30 mg  30 mg Oral QHS PRN Lavina Hamman, MD   30 mg at 01/17/19 0510   Current Outpatient Medications  Medication Sig Dispense Refill   albuterol (PROVENTIL HFA;VENTOLIN HFA) 108 (90 Base) MCG/ACT inhaler Inhale 2 puffs into the lungs every 6 (six) hours as needed for wheezing or shortness of breath. 1 Inhaler 2   albuterol (PROVENTIL) (2.5 MG/3ML) 0.083% nebulizer solution Take 3 mLs (2.5 mg total) by nebulization every 6 (six) hours as needed for wheezing or shortness  of breath. 75 mL 2   ALPRAZolam (XANAX) 0.25 MG tablet TAKE 1 TABLET(0.25 MG) BY MOUTH AT BEDTIME AS NEEDED FOR ANXIETY 30 tablet 1   amiodarone (PACERONE) 200 MG tablet Take 1 tablet (200 mg total) by mouth daily. 90 tablet 3   apixaban (ELIQUIS) 5 MG TABS tablet Take 1 tablet (5 mg total) by mouth 2 (two) times daily. 180 tablet 2   cetirizine (ZYRTEC) 10 MG tablet Take 10 mg by mouth daily.     Cholecalciferol (VITAMIN D-1000 MAX ST) 1000 UNITS tablet Take 1,000 Units by mouth 2 (two) times daily.      cyclobenzaprine (FLEXERIL) 10 MG tablet Take 1 tablet (10 mg total) by mouth every 8 (eight) hours as needed. 30 tablet 1   DULoxetine (CYMBALTA) 60 MG capsule Take 1 capsule (60 mg total) by mouth daily. 90 capsule 0   fentaNYL (DURAGESIC) 12 MCG/HR Place 1 patch onto the skin every 3 (three) days. 10 patch 0   ferrous sulfate (IRON SUPPLEMENT) 325 (65 FE) MG tablet Take 1 tablet by mouth daily.     fluticasone (FLONASE) 50 MCG/ACT nasal spray Place 2 sprays into both nostrils as needed. 48 g 1   hydrALAZINE (APRESOLINE) 25 MG tablet Take 1 tablet (25 mg total) by mouth 3 (three) times daily. For BP if above 150/90  90 tablet 0   hydrOXYzine (ATARAX/VISTARIL) 10 MG tablet Take 10 mg by mouth 3 (three) times daily as needed.     ipratropium (ATROVENT HFA) 17 MCG/ACT inhaler Inhale 2 puffs into the lungs every 6 (six) hours as needed for wheezing.      isosorbide mononitrate (IMDUR) 30 MG 24 hr tablet Take 1 tablet (30 mg total) by mouth daily. 90 tablet 1   lactulose (CHRONULAC) 10 GM/15ML solution Take 15 mLs (10 g total) by mouth 2 (two) times daily as needed for mild constipation. 473 mL 0   levothyroxine (SYNTHROID) 25 MCG tablet Take 1 tablet (25 mcg total) by mouth daily before breakfast. 30 tablet 2   lidocaine (XYLOCAINE) 2 % solution Use as directed 15 mLs in the mouth or throat as needed for mouth pain. (Patient not taking: Reported on 01/01/2019) 100 mL 0   losartan (COZAAR) 100 MG tablet Take 1 tablet (100 mg total) by mouth daily. 30 tablet 0   Lysine 1000 MG TABS Take 1,000 mg by mouth daily.      Magnesium Oxide 400 (240 Mg) MG TABS Take 400 mg by mouth 2 (two) times daily.      metoCLOPramide (REGLAN) 5 MG tablet Take 1 tablet (5 mg total) by mouth every 8 (eight) hours as needed for nausea or vomiting. 90 tablet 1   metoprolol succinate (TOPROL-XL) 50 MG 24 hr tablet Take 1 tablet (50 mg total) by mouth daily. Take with or immediately following a meal. 90 tablet 1   mometasone-formoterol (DULERA) 200-5 MCG/ACT AERO Inhale 2 puffs into the lungs 2 (two) times daily.     Multiple Vitamins-Minerals (MULTIVITAMIN PO) Take 1 tablet by mouth daily.      Omega-3 Fatty Acids (FISH OIL) 1000 MG CAPS Take 1,000 capsules by mouth 2 (two) times daily.      ondansetron (ZOFRAN-ODT) 8 MG disintegrating tablet Take 1 tablet (8 mg total) by mouth every 8 (eight) hours as needed for nausea or vomiting. 60 tablet 3   oxyCODONE-acetaminophen (PERCOCET) 10-325 MG tablet Take 1 tablet by mouth every 4 (four) hours as needed for pain. 90 tablet 0  pantoprazole (PROTONIX) 40 MG tablet Take 1 tablet  (40 mg total) by mouth daily. 90 tablet 0   polyethylene glycol powder (GLYCOLAX/MIRALAX) 17 GM/SCOOP powder Take 17 g by mouth daily as needed for mild constipation or moderate constipation.      senna (SENOKOT) 8.6 MG TABS tablet Take 1-2 tablets (8.6-17.2 mg total) by mouth 2 (two) times daily. 90 tablet 2   SUMAtriptan (IMITREX) 100 MG tablet Take 1 tablet (100 mg total) by mouth daily as needed for migraine. May repeat in 2 hours if headache persists or recurs. 10 tablet 2   temazepam (RESTORIL) 30 MG capsule Take 1 capsule (30 mg total) by mouth at bedtime as needed for sleep. 90 capsule 1   torsemide (DEMADEX) 20 MG tablet Take 2 tablets (40 mg total) by mouth 2 (two) times daily. 120 tablet 0   vitamin B-12 (CYANOCOBALAMIN) 1000 MCG tablet Take 1,000 mcg by mouth daily.     vitamin C (ASCORBIC ACID) 500 MG tablet Take 500 mg by mouth daily.     Facility-Administered Medications Ordered in Other Encounters  Medication Dose Route Frequency Provider Last Rate Last Dose   0.9 %  sodium chloride infusion   Intravenous Once Grayland Ormond, Kathlene November, MD       0.9 %  sodium chloride infusion   Intravenous Once Grayland Ormond, Kathlene November, MD       heparin lock flush 100 unit/mL  500 Units Intravenous Once Lloyd Huger, MD       sodium chloride flush (NS) 0.9 % injection 10 mL  10 mL Intravenous PRN Lloyd Huger, MD   10 mL at 06/16/17 0854   sodium chloride flush (NS) 0.9 % injection 10 mL  10 mL Intravenous Once Lloyd Huger, MD        OBJECTIVE: Vitals:   01/17/19 0511 01/17/19 0957  BP:  132/88  Pulse:  71  Resp:  16  Temp: 98 F (36.7 C) 97.8 F (36.6 C)  SpO2:  100%     Body mass index is 27.44 kg/m.    ECOG FS:1 - Symptomatic but completely ambulatory  General: Well-developed, well-nourished, no acute distress. Eyes: Pink conjunctiva, anicteric sclera. HEENT: Normocephalic, moist mucous membranes, clear oropharnyx. Lungs: Clear to auscultation  bilaterally. Heart: Regular rate and rhythm. No rubs, murmurs, or gallops. Abdomen: Mildly distended. Musculoskeletal: No edema, cyanosis, or clubbing. Neuro: Alert, answering all questions appropriately. Cranial nerves grossly intact. Skin: No rashes or petechiae noted. Psych: Normal affect. Lymphatics: No cervical, calvicular, axillary or inguinal LAD.   LAB RESULTS:  Lab Results  Component Value Date   NA 132 (L) 01/17/2019   K 3.9 01/17/2019   CL 94 (L) 01/17/2019   CO2 28 01/17/2019   GLUCOSE 80 01/17/2019   BUN 54 (H) 01/17/2019   CREATININE 4.80 (H) 01/17/2019   CALCIUM 8.6 (L) 01/17/2019   PROT 6.3 (L) 01/16/2019   ALBUMIN 2.4 (L) 01/16/2019   AST 54 (H) 01/16/2019   ALT 35 01/16/2019   ALKPHOS 211 (H) 01/16/2019   BILITOT 0.6 01/16/2019   GFRNONAA 9 (L) 01/17/2019   GFRAA 10 (L) 01/17/2019    Lab Results  Component Value Date   WBC 8.0 01/17/2019   NEUTROABS 3.7 11/03/2018   HGB 10.8 (L) 01/17/2019   HCT 35.4 (L) 01/17/2019   MCV 93.4 01/17/2019   PLT 344 01/17/2019     STUDIES: Ct Abdomen Pelvis Wo Contrast  Result Date: 01/16/2019 CLINICAL DATA:  No bowel movement 9  days.  Stage IV ovarian cancer. EXAM: CT ABDOMEN AND PELVIS WITHOUT CONTRAST TECHNIQUE: Multidetector CT imaging of the abdomen and pelvis was performed following the standard protocol without IV contrast. COMPARISON:  November 06, 2018 FINDINGS: Lower chest: Bilateral pleural effusions persist but are smaller in the interval. There is fluid adjacent to the distal esophagus extending from the abdomen. Hepatobiliary: The patient is status post cholecystectomy. The liver is a otherwise unremarkable on this unenhanced study. Common bile duct dilatation appears similar, likely due to previous cholecystectomy. Pancreas: The pancreas is poorly evaluated due to diffuse ascites and Burgess of contrast. No obvious pancreatic abnormality identified. Spleen: Normal in size without focal abnormality.  Adrenals/Urinary Tract: Adrenal glands are normal. No renal stones or obstruction identified. No ureteral stones are noted. The bladder is unremarkable. Stomach/Bowel: The stomach and small bowel are normal. Severe fecal loading extends from the cecum into the mid sigmoid colon. An underlying cause is not seen. No pneumatosis noted. The appendix is not visualized but there is no secondary evidence of appendicitis. Vascular/Lymphatic: Mild atherosclerotic changes seen in the abdominal aorta. No definite adenopathy. Reproductive: Status post hysterectomy. No adnexal masses. Other: Severe ascites throughout the abdomen is significantly worsened in the interval. The peritoneal drain appears to be in good position, terminating in the left upper quadrant. It is difficult to evaluate for peritoneal or omental disease given the Burgess of contrast and the presence of ascites. However, there does appear to be omental/peritoneal nodularity on the left such as on axial image 37, more prominent the interval. Musculoskeletal: No acute or significant osseous findings. IMPRESSION: 1. Severe fecal loading from the cecum through the mid sigmoid colon of uncertain etiology. 2. The bilateral pleural effusions are smaller in the interval but persist. 3. The amount of ascites is significantly increased in the interval. 4. Evaluation for omental/peritoneal metastases is limited due to Burgess of contrast. However, there appears to be omental/peritoneal disease on the left which is more prominent the interval. 5. Mild atherosclerotic change in the abdominal aorta. Electronically Signed   By: Dorise Bullion III M.D   On: 01/16/2019 16:21   Dg Chest 2 View  Result Date: 01/05/2019 CLINICAL DATA:  Productive cough. Vomiting. Constipation. Metastatic ovarian cancer. EXAM: CHEST - 2 VIEW COMPARISON:  Chest x-ray dated 10/13/2018 and 10/10/2018 FINDINGS: Power port in place with the tip in the superior vena cava just below the level of the  carina. Double lumen dialysis catheter in place with the tips at the cavoatrial junction. Heart size and pulmonary vascularity are normal. There are minimal bilateral pleural effusions. No discrete infiltrates. Minimal atelectasis at the left lung base. No acute bone abnormality. IMPRESSION: 1. Minimal bilateral pleural effusions. 2. Minimal atelectasis at the left lung base. 3. No discrete infiltrates. Electronically Signed   By: Lorriane Shire M.D.   On: 01/05/2019 13:01   Dg Abdomen 1 View  Result Date: 01/16/2019 CLINICAL DATA:  Constipation. EXAM: ABDOMEN - 1 VIEW COMPARISON:  January 05, 2019 FINDINGS: Severe fecal loading is seen throughout the length of the colon. A peritoneal drainage catheters again identified, terminating in the left upper quadrant. Pleural effusions are noted. No other acute abnormalities. IMPRESSION: Severe fecal loading in the colon. Small pleural effusions. Stable peritoneal drainage catheter. Electronically Signed   By: Dorise Bullion III M.D   On: 01/16/2019 14:59   Ir Guided Niel Hummer W Catheter Placement  Result Date: 01/01/2019 CLINICAL DATA:  History of serous ovarian carcinoma and malignant ascites. Need for tunneled  peritoneal drainage catheter for palliative care. EXAM: INSERTION OF TUNNELED PERITONEAL DRAINAGE CATHETER ANESTHESIA/SEDATION: 1.0 mg IV Versed; 75 mcg IV Fentanyl. Total Moderate Sedation Time 15 minutes. The patient's level of consciousness and physiologic status were continuously monitored during the procedure by Radiology nursing. MEDICATIONS: 2 g IV Ancef. Antibiotic was administered in an appropriate time interval for the procedure. FLUOROSCOPY TIME:  Less than 6 seconds.  1.5 mGy. PROCEDURE: The procedure, risks, benefits, and alternatives were explained to the patient. Questions regarding the procedure were encouraged and answered. The patient understands and consents to the procedure. A time-out was performed prior to initiating the procedure. The  right abdominal wall was prepped with chlorhexidine in a sterile fashion, and a sterile drape was applied covering the operative field. A sterile gown and sterile gloves were used for the procedure. Local anesthesia was provided with 1% Lidocaine. Ultrasound image documentation was performed. Fluoroscopy during the procedure and fluoroscopic spot radiograph confirms appropriate catheter position. After creating a small skin incision, a 19 gauge needle was advanced into the peritoneal cavity under ultrasound guidance. A guide wire was then advanced under fluoroscopy into the peritoneal cavity. Peritoneal access was dilated serially and a 16-French peel-away sheath placed. A 15.5 French tunneled peritoneal PleurX catheter was placed. This was tunneled from an incision 5 cm below the peritoneal access to the access site. The catheter was advanced through the peel-away sheath. The sheath was then removed. Final catheter positioning was confirmed with a fluoroscopic spot image. The peritoneal access incision was closed with subcutaneous and subcuticular 4-0 Vicryl. Dermabond was applied to the incision. A Prolene retention suture was applied at the catheter exit site. Large volume paracentesis was performed through the new catheter utilizing drainage bottles. COMPLICATIONS: None. FINDINGS: The catheter was placed via the right abdominal wall. Catheter course is to the left with the tip in the left upper quadrant. Approximately 3.5 liters of ascites was able to be removed after catheter placement. IMPRESSION: Placement of tunneled peritoneal drainage catheter via right abdominal approach. 3.5 liters of ascites was removed today after catheter placement. Electronically Signed   By: Aletta Edouard M.D.   On: 01/01/2019 12:02   US Paracentesis  Result Date: 12/21/2018 INDICATION: Recurrent ascites EXAM: ULTRASOUND GUIDED PARACENTESIS MEDICATIONS: None. COMPLICATIONS: None immediate. PROCEDURE: Informed written consent  was obtained from the patient after a discussion of the risks, benefits and alternatives to treatment. A timeout was performed prior to the initiation of the procedure. Initial ultrasound scanning demonstrates a large amount of ascites within the right lower abdominal quadrant. The right lower abdomen was prepped and draped in the usual sterile fashion. 1% lidocaine was used for local anesthesia. Following this, a 6 Fr Safe-T-Centesis catheter was introduced. An ultrasound image was saved for documentation purposes. The paracentesis was performed. The catheter was removed and a dressing was applied. The patient tolerated the procedure well without immediate post procedural complication. Patient received post-procedure intravenous albumin; see nursing notes for details. FINDINGS: A total of approximately 2.5 L of clear yellow fluid was removed. Samples were sent to the laboratory as requested by the clinical team. IMPRESSION: Successful ultrasound-guided paracentesis yielding 2.5 liters of peritoneal fluid. Electronically Signed   By: Inez Catalina M.D.   On: 12/21/2018 15:24   Dg Abd 2 Views  Result Date: 01/05/2019 CLINICAL DATA:  Nausea and constipation. Productive cough. Metastatic ovarian cancer. Peritoneal drainage catheter. EXAM: ABDOMEN - 2 VIEW COMPARISON:  Radiograph dated 08/10/2018 FINDINGS: There are no dilated loops of large  or small bowel. There is extensive stool throughout the colon including in the rectum but there is no discrete fecal impaction. No significant air in the bowel. Ascites. Peritoneal drainage catheter tip is in the left upper quadrant. There are small bilateral pleural effusions. Surgical clips from previous cholecystectomy. No acute bone abnormality. IMPRESSION: 1. Extensive stool throughout the colon. No discrete fecal impaction. 2. No findings suggestive of bowel obstruction. 3. Small bilateral pleural effusions.  Ascites. Electronically Signed   By: Lorriane Shire M.D.   On:  01/05/2019 12:59    ASSESSMENT: Progressive ovarian cancer, severe constipation, end-stage renal disease.  PLAN:    1.  Progressive ovarian cancer: Patient last evaluated in the [place] on November 11, 2018 at which time she elected to discontinue all chemotherapy, but was not yet ready to enroll in hospice.  She last received chemotherapy with single agent gemcitabine on October 06, 2018.  CT scan results from January 16, 2019 suggest progression of disease, but not definitive.  No intervention is needed.  Patient does not wish to reinitiate treatment at this time. 2.  Constipation: CT scan results as above with severe fecal loading and impaction.  Awaiting GI and surgical input. 3.  End-stage renal disease: Continue dialysis on Tuesday, Thursdays, and Saturdays. 4.  Anemia: Mild, monitor.  Appreciate consult, will follow.   Lloyd Huger, MD   01/17/2019 10:34 AM

## 2019-01-17 NOTE — ED Notes (Signed)
This RN spoke with NP and admitting MD. This RN made aware that GI will need to be contacted to get involved with POC. NP states pt can only have ice chips. NP also states pt to continue bowel prep as tolerated. Pt given ice chips. Will continue to monitor.

## 2019-01-17 NOTE — Progress Notes (Signed)
[facility]- [place], [place] 01/17/19  Subjective:   LOS: 1 No intake/output data recorded. Patient known to our practice from outpatient dialysis She presents for severe constipation, nausea and vomiting Denies any fevers, chills, cough, hemoptysis.  Denies hematuria or blood in the stool. Continues to have some lower extremity edema No shortness of breath Last hemodialysis was Thursday.  Missed Saturday treatment because of ER evaluation  Objective:  Vital signs in last 24 hours:  Temp:  [97.8 F (36.6 C)-98 F (36.7 C)] 97.8 F (36.6 C) (11/01 0957) Pulse Rate:  [71-80] 71 (11/01 0957) Resp:  [14-16] 16 (11/01 0957) BP: (103-132)/(56-88) 132/88 (11/01 0957) SpO2:  [91 %-100 %] 100 % (11/01 0957)  Weight change:  Filed Weights   01/16/19 1250  Weight: 68 kg    Intake/Output:   No intake or output data in the 24 hours ending 01/17/19 1429   Physical Exam: General:  No acute distress, laying in the bed  HEENT  anicteric, moist oral mucous membranes  Pulm/lungs  room air, normal breathing effort, clear to auscultation  CVS/Heart  regular, no rub  Abdomen:   Soft, nontender  Extremities:  2+ pitting edema  Neurologic:  Alert, oriented  Skin:  Some hyperemic changes over lower legs  Access:  Left IJ PermCath, PD catheter in place       Basic Metabolic Panel:  Recent Labs  Lab 01/16/19 1256 01/17/19 0512  NA 132* 132*  K 4.2 3.9  CL 94* 94*  CO2 25 28  GLUCOSE 90 80  BUN 48* 54*  CREATININE 4.54* 4.80*  CALCIUM 8.8* 8.6*     CBC: Recent Labs  Lab 01/16/19 1256 01/17/19 0512  WBC 9.2 8.0  HGB 11.7* 10.8*  HCT 37.4 35.4*  MCV 93.7 93.4  PLT 292 344      Lab Results  Component Value Date   HEPBSAG Negative 08/10/2018   HEPBSAB Reactive 08/10/2018      Microbiology:  No results found for this or any previous visit (from the past 240 hour(s)).  Coagulation Studies: No results for input(s): LABPROT, INR in the last  72 hours.  Urinalysis: Recent Labs    01/16/19 1256  COLORURINE YELLOW*  LABSPEC 1.016  PHURINE 5.0  GLUCOSEU NEGATIVE  HGBUR NEGATIVE  BILIRUBINUR NEGATIVE  KETONESUR NEGATIVE  PROTEINUR NEGATIVE  NITRITE NEGATIVE  LEUKOCYTESUR NEGATIVE      Imaging: Ct Abdomen Pelvis Wo Contrast  Result Date: 01/16/2019 CLINICAL DATA:  No bowel movement 9 days.  Stage IV ovarian cancer. EXAM: CT ABDOMEN AND PELVIS WITHOUT CONTRAST TECHNIQUE: Multidetector CT imaging of the abdomen and pelvis was performed following the standard protocol without IV contrast. COMPARISON:  November 06, 2018 FINDINGS: Lower chest: Bilateral pleural effusions persist but are smaller in the interval. There is fluid adjacent to the distal esophagus extending from the abdomen. Hepatobiliary: The patient is status post cholecystectomy. The liver is a otherwise unremarkable on this unenhanced study. Common bile duct dilatation appears similar, likely due to previous cholecystectomy. Pancreas: The pancreas is poorly evaluated due to diffuse ascites and lack of contrast. No obvious pancreatic abnormality identified. Spleen: Normal in size without focal abnormality. Adrenals/Urinary Tract: Adrenal glands are normal. No renal stones or obstruction identified. No ureteral stones are noted. The bladder is unremarkable. Stomach/Bowel: The stomach and small bowel are normal. Severe fecal loading extends from the cecum into the mid sigmoid colon. An underlying cause is not seen. No pneumatosis noted. The appendix is not visualized but  there is no secondary evidence of appendicitis. Vascular/Lymphatic: Mild atherosclerotic changes seen in the abdominal aorta. No definite adenopathy. Reproductive: Status post hysterectomy. No adnexal masses. Other: Severe ascites throughout the abdomen is significantly worsened in the interval. The peritoneal drain appears to be in good position, terminating in the left upper quadrant. It is difficult to evaluate  for peritoneal or omental disease given the lack of contrast and the presence of ascites. However, there does appear to be omental/peritoneal nodularity on the left such as on axial image 37, more prominent the interval. Musculoskeletal: No acute or significant osseous findings. IMPRESSION: 1. Severe fecal loading from the cecum through the mid sigmoid colon of uncertain etiology. 2. The bilateral pleural effusions are smaller in the interval but persist. 3. The amount of ascites is significantly increased in the interval. 4. Evaluation for omental/peritoneal metastases is limited due to lack of contrast. However, there appears to be omental/peritoneal disease on the left which is more prominent the interval. 5. Mild atherosclerotic change in the abdominal aorta. Electronically Signed   By: Dorise Bullion III M.D   On: 01/16/2019 16:21   Dg Abdomen 1 View  Result Date: 01/16/2019 CLINICAL DATA:  Constipation. EXAM: ABDOMEN - 1 VIEW COMPARISON:  January 05, 2019 FINDINGS: Severe fecal loading is seen throughout the length of the colon. A peritoneal drainage catheters again identified, terminating in the left upper quadrant. Pleural effusions are noted. No other acute abnormalities. IMPRESSION: Severe fecal loading in the colon. Small pleural effusions. Stable peritoneal drainage catheter. Electronically Signed   By: Dorise Bullion III M.D   On: 01/16/2019 14:59     Medications:    . apixaban  5 mg Oral BID  . levothyroxine  25 mcg Oral QAC breakfast  . pantoprazole  40 mg Oral Daily   acetaminophen **OR** acetaminophen, ondansetron **OR** ondansetron (ZOFRAN) IV, oxyCODONE-acetaminophen, temazepam  Assessment/ Plan:  63 y.o. female with metastatic ovarian cancer, end-stage renal disease on hemodialysis, paroxysmal atrial fibrillation, ascites, hypothyroidism, GERD  Principal Problem:   Constipation Active Problems:   Gastro-esophageal reflux disease without esophagitis   Ovarian cancer,  unspecified laterality ([place])   Hypothyroidism due to medication   HTN (hypertension)   ESRD on dialysis ([place])   (HFpEF) heart failure with preserved ejection fraction (H[facility])   Paroxysmal atrial fibrillation ([facility])   Ascites   Peritoneal disease  [facility] [address]/left IJ PermCath/67 kg/TTS 1  #.  End-stage renal disease -Patient missed her hemodialysis on Saturday and wishes to do a make-up treatment on Monday.  We will set it up and place orders - Ultrafiltration as tolerated  #. Anemia of CKD  Lab Results  Component Value Date   HGB 10.8 (L) 01/17/2019  Patient has metastatic cancer.  Discuss EPO use with hematologist  #. SHPTH    Component Value Date/Time   PTH 148 (H) 08/10/2018 1640   Lab Results  Component Value Date   PHOS 3.1 10/13/2018   #Severe constipation -CT abdomen with oral contrast shows stool burden and ascites -Currently being treated with GoLYTELY.  #Ascites, metastatic ovarian cancer -Ascites can be drained via PD catheter.  Patient will request when she feels abdominal distention    LOS: 1 Wilian Kwong 11/1/20202:29 PM  [place], [place]

## 2019-01-17 NOTE — ED Notes (Signed)
Soap sud enema unsuccessful. Pt bowels kinked tubing multiple times and would not let tubing go up to blockage. Pt tolerated well x2 but was unable to get any bowel movement. MD notified.

## 2019-01-17 NOTE — ED Notes (Addendum)
Pharmacy tech called to review pt's home meds. Will send attending a message via secure chat to review pt's medications when med rec is complete.

## 2019-01-17 NOTE — ED Notes (Signed)
Patient resting.

## 2019-01-17 NOTE — Progress Notes (Signed)
PROGRESS NOTE    Jessica Burgess  NWG:[id number] DOB: [birth year] DOA: 01/16/2019 PCP: Steele Sizer, MD   Brief Narrative: Ms. Jessica Burgess, 63 year old lady with past medical history significant for progressive ovarian cancer, hypertension, ESRD on dialysis (Tuesday, Thursday and Saturday) and paroxysmal atrial fibrillation came to [facility] ED with complaints of worsening constipation and abdominal pain for 9 days.  She failed lactulose and multiple enemas.  She was started on bowel prep with no bowel movements yet.  Assessment & Plan:   Principal Problem:   Constipation Active Problems:   Gastro-esophageal reflux disease without esophagitis   Ovarian cancer, unspecified laterality (H[facility])   Hypothyroidism due to medication   HTN (hypertension)   ESRD on dialysis ([facility])   (HFpEF) heart failure with preserved ejection fraction (HCC)   Paroxysmal atrial fibrillation (H[facility])   Ascites   Peritoneal disease  Constipation.  Patient appears little dry.  Having decreased appetite and some nausea.  There is a concern of bowel obstruction secondary to her advanced ovarian cancer as CT scan was consistent with progression of disease with extensive fecal load. -Keep patient n.p.o. -GI was consulted for colonoscopy/manual disimpaction. -Might need surgical consult.  Advanced ovarian cancer.  Most likely the cause of her bad constipation.  Oncology was consulted but patient does not want to restart chemo.  She is not enrolled in hospice yet.  -Consult palliative care to discuss goals of care at this point. -Might need a surgical consult if GI was unable to relieve her constipation. -Patient has a PD catheter in place for her ascites- will need paracentesis through that catheter.  ESRD.  She missed her dialysis on Saturday due to abdominal pain. -Nephrology was consulted and will be dialyzed accordingly.  HTN.  Continue home antihypertensives.  Paroxysmal A. Fib.  Currently in sinus rhythm.  -Continue home meds.  Hypothyroidism.  TSH checked in July 2020 was 3.7. -Continue home Synthroid.  DVT prophylaxis: Eliquis  Code Status: Full  Family Communication:  Disposition Plan: Discharge in 2 to 3 days depending on goals of care discussion.  Consultants:   Nephrology  Oncology  GI  Palliative  Antimicrobials: None  Subjective: She was complaining of diffuse, crampy abdominal pain radiating to her back.  She was also complaining of nausea but denies any vomiting.  She was complaining of decreased in her appetite.  Objective: Vitals:   01/16/19 1900 01/17/19 0224 01/17/19 0511 01/17/19 0957  BP: 109/62 107/80  132/88  Pulse: 77 80  71  Resp:  14  16  Temp:  97.8 F (36.6 C) 98 F (36.7 C) 97.8 F (36.6 C)  TempSrc:  Oral Oral Oral  SpO2: 94% 98%  100%  Weight:      Height:       No intake or output data in the 24 hours ending 01/17/19 1436 Filed Weights   01/16/19 1250  Weight: 68 kg    Examination:  General exam: Appears calm and comfortable  Respiratory system: Clear to auscultation. Respiratory effort normal. Cardiovascular system: S1 & S2 heard, RRR. No JVD, murmurs, rubs, gallops or clicks.  1+ lower extremity edema bilaterally. Gastrointestinal system: Distended, tense abdomen, diffusely tender.  Hypoactive bowel sounds.  Right-sided PD catheter in place with a central midline surgical scar. Central nervous system: Alert and oriented. No focal neurological deficits. Extremities: Symmetric 5 x 5 power. Skin: No rashes, lesions or ulcers Psychiatry: Judgement and insight appear normal. Mood & affect appropriate.   Data Reviewed: I have personally reviewed following  labs and imaging studies.  CBC: Recent Labs  Lab 01/16/19 1256 01/17/19 0512  WBC 9.2 8.0  HGB 11.7* 10.8*  HCT 37.4 35.4*  MCV 93.7 93.4  PLT 292 419   Basic Metabolic Panel: Recent Labs  Lab 01/16/19 1256 01/17/19 0512  NA 132* 132*  K 4.2 3.9  CL 94* 94*  CO2  25 28  GLUCOSE 90 80  BUN 48* 54*  CREATININE 4.54* 4.80*  CALCIUM 8.8* 8.6*   GFR: Estimated Creatinine Clearance: 10.9 mL/min (A) (by C-G formula based on SCr of 4.8 mg/dL (H)). Liver Function Tests: Recent Labs  Lab 01/16/19 1256  AST 54*  ALT 35  ALKPHOS 211*  BILITOT 0.6  PROT 6.3*  ALBUMIN 2.4*   No results for input(s): LIPASE, AMYLASE in the last 168 hours. No results for input(s): AMMONIA in the last 168 hours. Coagulation Profile: No results for input(s): INR, PROTIME in the last 168 hours. Cardiac Enzymes: No results for input(s): CKTOTAL, CKMB, CKMBINDEX, TROPONINI in the last 168 hours. BNP (last 3 results) No results for input(s): PROBNP in the last 8760 hours. HbA1C: No results for input(s): HGBA1C in the last 72 hours. CBG: No results for input(s): GLUCAP in the last 168 hours. Lipid Profile: No results for input(s): Burgess, HDL, LDLCALC, TRIG, CHOLHDL, LDLDIRECT in the last 72 hours. Thyroid Function Tests: No results for input(s): TSH, T4TOTAL, FREET4, T3FREE, THYROIDAB in the last 72 hours. Anemia Panel: No results for input(s): VITAMINB12, FOLATE, FERRITIN, TIBC, IRON, RETICCTPCT in the last 72 hours. Sepsis Labs: Recent Labs  Lab 01/16/19 1524  LATICACIDVEN 0.9    No results found for this or any previous visit (from the past 240 hour(s)).   Radiology Studies: Ct Abdomen Pelvis Wo Contrast  Result Date: 01/16/2019 CLINICAL DATA:  No bowel movement 9 days.  Stage IV ovarian cancer. EXAM: CT ABDOMEN AND PELVIS WITHOUT CONTRAST TECHNIQUE: Multidetector CT imaging of the abdomen and pelvis was performed following the standard protocol without IV contrast. COMPARISON:  November 06, 2018 FINDINGS: Lower chest: Bilateral pleural effusions persist but are smaller in the interval. There is fluid adjacent to the distal esophagus extending from the abdomen. Hepatobiliary: The patient is status post cholecystectomy. The liver is a otherwise unremarkable on this  unenhanced study. Common bile duct dilatation appears similar, likely due to previous cholecystectomy. Pancreas: The pancreas is poorly evaluated due to diffuse ascites and lack of contrast. No obvious pancreatic abnormality identified. Spleen: Normal in size without focal abnormality. Adrenals/Urinary Tract: Adrenal glands are normal. No renal stones or obstruction identified. No ureteral stones are noted. The bladder is unremarkable. Stomach/Bowel: The stomach and small bowel are normal. Severe fecal loading extends from the cecum into the mid sigmoid colon. An underlying cause is not seen. No pneumatosis noted. The appendix is not visualized but there is no secondary evidence of appendicitis. Vascular/Lymphatic: Mild atherosclerotic changes seen in the abdominal aorta. No definite adenopathy. Reproductive: Status post hysterectomy. No adnexal masses. Other: Severe ascites throughout the abdomen is significantly worsened in the interval. The peritoneal drain appears to be in good position, terminating in the left upper quadrant. It is difficult to evaluate for peritoneal or omental disease given the lack of contrast and the presence of ascites. However, there does appear to be omental/peritoneal nodularity on the left such as on axial image 37, more prominent the interval. Musculoskeletal: No acute or significant osseous findings. IMPRESSION: 1. Severe fecal loading from the cecum through the mid sigmoid colon of uncertain  etiology. 2. The bilateral pleural effusions are smaller in the interval but persist. 3. The amount of ascites is significantly increased in the interval. 4. Evaluation for omental/peritoneal metastases is limited due to lack of contrast. However, there appears to be omental/peritoneal disease on the left which is more prominent the interval. 5. Mild atherosclerotic change in the abdominal aorta. Electronically Signed   By: Dorise Bullion III M.D   On: 01/16/2019 16:21   Dg Abdomen 1 View   Result Date: 01/16/2019 CLINICAL DATA:  Constipation. EXAM: ABDOMEN - 1 VIEW COMPARISON:  January 05, 2019 FINDINGS: Severe fecal loading is seen throughout the length of the colon. A peritoneal drainage catheters again identified, terminating in the left upper quadrant. Pleural effusions are noted. No other acute abnormalities. IMPRESSION: Severe fecal loading in the colon. Small pleural effusions. Stable peritoneal drainage catheter. Electronically Signed   By: Dorise Bullion III M.D   On: 01/16/2019 14:59    Scheduled Meds: . apixaban  5 mg Oral BID  . levothyroxine  25 mcg Oral QAC breakfast  . pantoprazole  40 mg Oral Daily   Continuous Infusions:   LOS: 1 day    Time spent: 45 minutes.    Lorella Nimrod, MD [facility] Pager [phone]  If 7PM-7AM, please contact night-coverage [email or web address] Password [facility] 01/17/2019, 2:36 PM

## 2019-01-17 NOTE — ED Notes (Signed)
Pt also given prune juice to try. Pt also ordered some apples from dietary stating this has helped in the past.

## 2019-01-17 NOTE — ED Notes (Signed)
Patient working on drinking go-lytely

## 2019-01-17 NOTE — ED Notes (Signed)
Pt given coffee and prune juice to attempt to move bowels. Pt also encouraged to walk around ER with sister.

## 2019-01-17 NOTE — Consult Note (Signed)
Jessica Lame, MD [facility]  [address], [place], [place] [address] Phone: [phone] Fax : [phone]  Consultation  Referring Provider:   Dr. Jannifer Franklin   Primary Care Physician:  Steele Sizer, MD Primary Gastroenterologist: Althia Forts        Reason for Consultation:     Constipation  Date of Admission:  01/16/2019 Date of Consultation:  01/17/2019         HPI:   Jessica Burgess is a 63 y.o. female who has a history of ovarian cancer and end-stage renal disease.  The patient has been diagnosed with ovarian cancer since 2018 when she had a CT scan when following up with Dr. Percell [place] office for abdominal pain on the left side.  The patient was found to have omental caking and later diagnosed with ovarian cancer.  The patient has been following up with oncology since then.  She states that due to her pain medication and chronic illness she does not have frequent bowel movements.  Recently it has been so severe that she now is admitted with the report of a bowel movement occurring 9 days ago and has had no bowel movement since.  She reports that she is having significant abdominal discomfort and was trying Fleet enemas at home having renal failure and a fleets enema being high in phosphorus.  She reports that when she was doing the enemas at home she was having very little results.  She continues to have the passing of small amounts of gas.  The patient reports that her rectum has been bleeding and she has had a lot of sensitivity and irritation due to the multiple enemas she was doing at home.  Past Medical History:  Diagnosis Date   Allergic rhinitis, cause unspecified    Anxiety state, unspecified    Arthritis    Asthma    only when sick    Backache, unspecified    Bronchitis    hx of when get sick   Cancer [facility])    skin cancer , basal cell    Cancer ([place]) 11/2016   ovarian   Cervicalgia    CHF (congestive heart failure) (HCC)    Complication of anesthesia      Dermatophytosis of nail    Dysmetabolic syndrome X    Encounter for long-term (current) use of other medications    Esophageal reflux    Hypertension    Insomnia, unspecified    Leukocytosis, unspecified    Migraine without aura, without mention of intractable migraine without mention of status migrainosus    Other and unspecified hyperlipidemia    Other malaise and fatigue    Overweight(278.02)    Personal history of chemotherapy now   ovarian   Renal insufficiency    Spinal stenosis in cervical region    Symptomatic menopausal or female climacteric states    Unspecified disorder of skin and subcutaneous tissue    Unspecified vitamin D deficiency     Past Surgical History:  Procedure Laterality Date   ABDOMINAL HYSTERECTOMY     ANTERIOR CERVICAL DECOMP/DISCECTOMY FUSION N/A 06/07/2015   Procedure: Cervical three - four and Cervical six- seven anterior cervical decompression with fusion interbody prosthesis plating and bonegraft;  Surgeon: Newman Pies, MD;  Location: [place] [facility];  Service: Neurosurgery;  Laterality: N/A;  C34 and C67 anterior cervical decompression with fusion interbody prosthesis plating and bonegraft   BACK SURGERY     x2 Lower    DIALYSIS/PERMA CATHETER INSERTION N/A 08/17/2018  Procedure: DIALYSIS/PERMA CATHETER INSERTION;  Surgeon: Algernon Huxley, MD;  Location: [place] [facility] LAB;  Service: Cardiovascular;  Laterality: N/A;   EVACUATION OF CERVICAL HEMATOMA N/A 06/14/2015   Procedure: EVACUATION OF CERVICAL HEMATOMA;  Surgeon: Newman Pies, MD;  Location: [place] [facility];  Service: Neurosurgery;  Laterality: N/A;   IR GUIDED DRAIN W CATHETER PLACEMENT  01/01/2019   NECK SURGERY     x3   TONSILLECTOMY      Prior to Admission medications   Medication Sig Start Date End Date Taking? Authorizing Provider  albuterol (PROVENTIL HFA;VENTOLIN HFA) 108 (90 Base) MCG/ACT inhaler Inhale 2 puffs into the lungs every 6 (six) hours as  needed for wheezing or shortness of breath. 10/04/16  Yes Hubbard Hartshorn, FNP  albuterol (PROVENTIL) (2.5 MG/3ML) 0.083% nebulizer solution Take 3 mLs (2.5 mg total) by nebulization every 6 (six) hours as needed for wheezing or shortness of breath. 08/18/18  Yes Sowles, Drue Stager, MD  ALPRAZolam (XANAX) 0.25 MG tablet TAKE 1 TABLET(0.25 MG) BY MOUTH AT BEDTIME AS NEEDED FOR ANXIETY Patient taking differently: Take 0.25 mg by mouth at bedtime as needed for anxiety.  12/10/18  Yes Lloyd Huger, MD  amiodarone (PACERONE) 200 MG tablet Take 1 tablet (200 mg total) by mouth daily. 10/28/18  Yes End, Harrell Gave, MD  apixaban (ELIQUIS) 5 MG TABS tablet Take 1 tablet (5 mg total) by mouth 2 (two) times daily. 12/09/18 01/17/19 Yes End, Harrell Gave, MD  cetirizine (ZYRTEC) 10 MG tablet Take 10 mg by mouth daily.   Yes [provider]  Cholecalciferol (VITAMIN D-1000 MAX ST) 1000 UNITS tablet Take 1,000 Units by mouth 2 (two) times daily.    Yes [provider]  cyclobenzaprine (FLEXERIL) 10 MG tablet Take 1 tablet (10 mg total) by mouth every 8 (eight) hours as needed. 01/06/19  Yes Borders, Kirt Boys, NP  DULoxetine (CYMBALTA) 60 MG capsule Take 1 capsule (60 mg total) by mouth daily. 12/23/18  Yes Sowles, Drue Stager, MD  fentaNYL (DURAGESIC) 12 MCG/HR Place 1 patch onto the skin every 3 (three) days. 01/11/19  Yes Jacquelin Hawking, NP  ferrous sulfate (IRON SUPPLEMENT) 325 (65 FE) MG tablet Take 325 mg by mouth daily.    Yes [provider]  fluticasone (FLONASE) 50 MCG/ACT nasal spray Place 2 sprays into both nostrils as needed. Patient taking differently: Place 2 sprays into both nostrils daily as needed for allergies or rhinitis.  06/11/18 01/17/19 Yes Sowles, Drue Stager, MD  hydrALAZINE (APRESOLINE) 25 MG tablet Take 1 tablet (25 mg total) by mouth 3 (three) times daily. For BP if above 150/90 12/04/18  Yes Sowles, Drue Stager, MD  hydrOXYzine (ATARAX/VISTARIL) 10 MG tablet Take 10 mg by  mouth 3 (three) times daily as needed for itching.    Yes [provider]  ipratropium (ATROVENT HFA) 17 MCG/ACT inhaler Inhale 2 puffs into the lungs every 6 (six) hours as needed for wheezing.    Yes [provider]  isosorbide mononitrate (IMDUR) 30 MG 24 hr tablet Take 1 tablet (30 mg total) by mouth daily. 12/04/18  Yes Sowles, Drue Stager, MD  lactulose (CHRONULAC) 10 GM/15ML solution Take 15 mLs (10 g total) by mouth 2 (two) times daily as needed for mild constipation. 01/06/19  Yes Borders, Kirt Boys, NP  levothyroxine (SYNTHROID) 25 MCG tablet Take 1 tablet (25 mcg total) by mouth daily before breakfast. 12/03/18  Yes Finnegan, Kathlene November, MD  lidocaine (XYLOCAINE) 2 % solution Use as directed 15 mLs in the  mouth or throat as needed for mouth pain. 11/28/17  Yes Jacquelin Hawking, NP  losartan (COZAAR) 100 MG tablet Take 1 tablet (100 mg total) by mouth daily. 10/15/18 01/17/19 Yes Pyreddy, Pavan, MD  Lysine 1000 MG TABS Take 1,000 mg by mouth daily.    Yes [provider]  Magnesium Oxide 400 (240 Mg) MG TABS Take 400 mg by mouth 2 (two) times daily.    Yes [provider]  metoCLOPramide (REGLAN) 5 MG tablet Take 1 tablet (5 mg total) by mouth every 8 (eight) hours as needed for nausea or vomiting. 01/11/19 01/11/20 Yes Burns, Wandra Feinstein, NP  metoprolol succinate (TOPROL-XL) 50 MG 24 hr tablet Take 1 tablet (50 mg total) by mouth daily. Take with or immediately following a meal. 12/04/18 03/04/19 Yes Sowles, Drue Stager, MD  mometasone-formoterol (DULERA) 200-5 MCG/ACT AERO Inhale 2 puffs into the lungs 2 (two) times daily as needed for wheezing or shortness of breath.    Yes [provider]  Multiple Vitamins-Minerals (MULTIVITAMIN PO) Take 1 tablet by mouth daily.    Yes [provider]  Omega-3 Fatty Acids (FISH OIL) 1000 MG CAPS Take 1,000 capsules by mouth 2 (two) times daily.    Yes [provider]  ondansetron (ZOFRAN-ODT) 8 MG  disintegrating tablet Take 1 tablet (8 mg total) by mouth every 8 (eight) hours as needed for nausea or vomiting. 07/20/18  Yes Borders, Kirt Boys, NP  oxyCODONE-acetaminophen (PERCOCET) 10-325 MG tablet Take 1 tablet by mouth every 4 (four) hours as needed for pain. 01/01/19  Yes Borders, Kirt Boys, NP  pantoprazole (PROTONIX) 40 MG tablet Take 1 tablet (40 mg total) by mouth daily. 01/11/19  Yes Burns, Wandra Feinstein, NP  polyethylene glycol powder (GLYCOLAX/MIRALAX) 17 GM/SCOOP powder Take 17 g by mouth daily as needed for mild constipation or moderate constipation.    Yes [provider]  senna (SENOKOT) 8.6 MG TABS tablet Take 1-2 tablets (8.6-17.2 mg total) by mouth 2 (two) times daily. Patient taking differently: Take 2 tablets by mouth 2 (two) times daily.  01/06/19  Yes Borders, Kirt Boys, NP  SUMAtriptan (IMITREX) 100 MG tablet Take 1 tablet (100 mg total) by mouth daily as needed for migraine. May repeat in 2 hours if headache persists or recurs. 12/23/18  Yes Sowles, Drue Stager, MD  temazepam (RESTORIL) 30 MG capsule Take 1 capsule (30 mg total) by mouth at bedtime as needed for sleep. 12/16/18  Yes Borders, Kirt Boys, NP  torsemide (DEMADEX) 20 MG tablet Take 2 tablets (40 mg total) by mouth 2 (two) times daily. 12/23/18 01/22/19 Yes Sowles, Drue Stager, MD  vitamin B-12 (CYANOCOBALAMIN) 1000 MCG tablet Take 1,000 mcg by mouth daily.   Yes [provider]  vitamin C (ASCORBIC ACID) 500 MG tablet Take 500 mg by mouth daily.   Yes [provider]  prochlorperazine (COMPAZINE) 10 MG tablet Take 1 tablet (10 mg total) by mouth every 6 (six) hours as needed (Nausea or vomiting). 12/02/16 08/08/17  Lloyd Huger, MD    Family History  Problem Relation Age of Onset   Depression Mother    Migraines Mother    Dementia Father    Diabetes Father    Hyperlipidemia Father    Hyperlipidemia Brother    Hyperlipidemia Brother    Breast cancer Paternal Aunt        73s      Social History   Tobacco Use   Smoking status: Former Smoker    Packs/day: 1.50  Years: 20.00    Pack years: 30.00    Types: Cigarettes    Start date: 03/19/1979    Quit date: 08/25/1999    Years since quitting: 19.4   Smokeless tobacco: Never Used   Tobacco comment: smoking cessation materials not required  Substance Use Topics   Alcohol use: Not Currently    Alcohol/week: 0.0 standard drinks   Drug use: No    Allergies as of 01/16/2019   (No Known Allergies)    Review of Systems:    All systems reviewed and negative except where noted in HPI.   Physical Exam:  Vital signs in last 24 hours: Temp:  [97.8 F (36.6 C)-98 F (36.7 C)] 97.8 F (36.6 C) (11/01 0957) Pulse Rate:  [70-80] 70 (11/01 1544) Resp:  [14-18] 18 (11/01 1544) BP: (103-154)/(56-88) 154/87 (11/01 1544) SpO2:  [91 %-100 %] 96 % (11/01 1544)   General:   Pleasant, cooperative in NAD Head:  Normocephalic and atraumatic. Eyes:   No icterus.   Conjunctiva pink. PERRLA. Ears:  Normal auditory acuity. Neck:  Supple; no masses or thyroidomegaly Lungs: Respirations even and unlabored. Lungs clear to auscultation bilaterally.   No wheezes, crackles, or rhonchi.  Heart:  Regular rate and rhythm;  Without murmur, clicks, rubs or gallops Abdomen:  Soft, distended, nontender.  Significantly decreased bowel sounds. No appreciable masses or hepatomegaly.  No rebound or guarding.  Rectal:  Not performed. Msk:  Symmetrical without gross deformities.   Extremities:  Without edema, cyanosis or clubbing. Neurologic:  Alert and oriented x3;  grossly normal neurologically. Skin:  Intact without significant lesions or rashes. Cervical Nodes:  No significant cervical adenopathy. Psych:  Alert and cooperative. Normal affect.  LAB RESULTS: Recent Labs    01/16/19 1256 01/17/19 0512  WBC 9.2 8.0  HGB 11.7* 10.8*  HCT 37.4 35.4*  PLT 292 344   BMET Recent Labs    01/16/19 1256 01/17/19 0512  NA 132*  132*  K 4.2 3.9  CL 94* 94*  CO2 25 28  GLUCOSE 90 80  BUN 48* 54*  CREATININE 4.54* 4.80*  CALCIUM 8.8* 8.6*   LFT Recent Labs    01/16/19 1256  PROT 6.3*  ALBUMIN 2.4*  AST 54*  ALT 35  ALKPHOS 211*  BILITOT 0.6   PT/INR No results for input(s): LABPROT, INR in the last 72 hours.  STUDIES: Ct Abdomen Pelvis Wo Contrast  Result Date: 01/16/2019 CLINICAL DATA:  No bowel movement 9 days.  Stage IV ovarian cancer. EXAM: CT ABDOMEN AND PELVIS WITHOUT CONTRAST TECHNIQUE: Multidetector CT imaging of the abdomen and pelvis was performed following the standard protocol without IV contrast. COMPARISON:  November 06, 2018 FINDINGS: Lower chest: Bilateral pleural effusions persist but are smaller in the interval. There is fluid adjacent to the distal esophagus extending from the abdomen. Hepatobiliary: The patient is status post cholecystectomy. The liver is a otherwise unremarkable on this unenhanced study. Common bile duct dilatation appears similar, likely due to previous cholecystectomy. Pancreas: The pancreas is poorly evaluated due to diffuse ascites and lack of contrast. No obvious pancreatic abnormality identified. Spleen: Normal in size without focal abnormality. Adrenals/Urinary Tract: Adrenal glands are normal. No renal stones or obstruction identified. No ureteral stones are noted. The bladder is unremarkable. Stomach/Bowel: The stomach and small bowel are normal. Severe fecal loading extends from the cecum into the mid sigmoid colon. An underlying cause is not seen. No pneumatosis noted. The appendix is not visualized but there is no secondary  evidence of appendicitis. Vascular/Lymphatic: Mild atherosclerotic changes seen in the abdominal aorta. No definite adenopathy. Reproductive: Status post hysterectomy. No adnexal masses. Other: Severe ascites throughout the abdomen is significantly worsened in the interval. The peritoneal drain appears to be in good position, terminating in the left  upper quadrant. It is difficult to evaluate for peritoneal or omental disease given the lack of contrast and the presence of ascites. However, there does appear to be omental/peritoneal nodularity on the left such as on axial image 37, more prominent the interval. Musculoskeletal: No acute or significant osseous findings. IMPRESSION: 1. Severe fecal loading from the cecum through the mid sigmoid colon of uncertain etiology. 2. The bilateral pleural effusions are smaller in the interval but persist. 3. The amount of ascites is significantly increased in the interval. 4. Evaluation for omental/peritoneal metastases is limited due to lack of contrast. However, there appears to be omental/peritoneal disease on the left which is more prominent the interval. 5. Mild atherosclerotic change in the abdominal aorta. Electronically Signed   By: Dorise Bullion III M.D   On: 01/16/2019 16:21   Dg Abdomen 1 View  Result Date: 01/16/2019 CLINICAL DATA:  Constipation. EXAM: ABDOMEN - 1 VIEW COMPARISON:  January 05, 2019 FINDINGS: Severe fecal loading is seen throughout the length of the colon. A peritoneal drainage catheters again identified, terminating in the left upper quadrant. Pleural effusions are noted. No other acute abnormalities. IMPRESSION: Severe fecal loading in the colon. Small pleural effusions. Stable peritoneal drainage catheter. Electronically Signed   By: Dorise Bullion III M.D   On: 01/16/2019 14:59      Impression / Plan:   Assessment: Principal Problem:   Constipation Active Problems:   Gastro-esophageal reflux disease without esophagitis   Ovarian cancer, unspecified laterality (HCC)   Hypothyroidism due to medication   HTN (hypertension)   ESRD on dialysis ([place])   (HFpEF) heart failure with preserved ejection fraction (HCC)   Paroxysmal atrial fibrillation ([place])   Ascites   Peritoneal disease   Jessica Burgess is a 63 y.o. y/o female with with a history of ovarian cancer and now  has constipation without a bowel movement for the last 9 days.  The patient was offered to try and disimpact her manually/digitally today but she would rather not since she states that she has had bleeding and discomfort from trying to go to the bathroom and using enemas at home.  Plan:  The patient will be given a approach to disimpact her from the top and the bottom with continued drinking of her colon prep in addition to scheduled soapsuds enemas.  The patient has been told to avoid her fleets enemas due to the high phosphorus since she has renal failure and is on dialysis.  The patient's constipation may be due to her pain medication versus extension of her cancer causing colonic obstruction.  Although colonic disimpaction can be attempted it is very rarely successful since the working channel of the colonoscope can only accommodate small instruments and cannot aspirate large amounts of stool.  A surgical consultation may be considered if the patient has a malignant obstruction and may need a diverting colostomy.  The patient has been explained the plan and agrees with it.  Thank you for involving me in the care of this patient.      LOS: 1 day   Jessica Lame, MD  01/17/2019, 4:25 PM Pager [phone] 7am-5pm  Check AMION for 5pm -7am coverage and on weekends   Note: This  dictation was prepared with Dragon dictation along with smaller phrase technology. Any transcriptional errors that result from this process are unintentional.

## 2019-01-18 ENCOUNTER — Inpatient Hospital Stay: Payer: PPO

## 2019-01-18 ENCOUNTER — Encounter: Payer: Self-pay | Admitting: Radiology

## 2019-01-18 DIAGNOSIS — K219 Gastro-esophageal reflux disease without esophagitis: Secondary | ICD-10-CM

## 2019-01-18 DIAGNOSIS — Z8543 Personal history of malignant neoplasm of ovary: Secondary | ICD-10-CM

## 2019-01-18 DIAGNOSIS — Z515 Encounter for palliative care: Secondary | ICD-10-CM

## 2019-01-18 LAB — MRSA PCR SCREENING: MRSA by PCR: NEGATIVE

## 2019-01-18 LAB — SARS CORONAVIRUS 2 (TAT 6-24 HRS): SARS Coronavirus 2: NEGATIVE

## 2019-01-18 MED ORDER — HEPARIN SODIUM (PORCINE) 1000 UNIT/ML DIALYSIS: 20.0000 [IU]/kg | INTRAMUSCULAR | Status: DC | PRN

## 2019-01-18 MED ORDER — IOHEXOL 300 MG/ML  SOLN
100.0000 mL | Freq: Once | INTRAMUSCULAR | Status: AC | PRN
  Administered 2019-01-18: 100 mL via INTRAVENOUS

## 2019-01-18 MED ORDER — IOHEXOL 9 MG/ML PO SOLN
500.0000 mL | ORAL | Status: AC
  Administered 2019-01-18 (×2): 500 mL via ORAL

## 2019-01-18 MED ORDER — BISACODYL 5 MG PO TBEC
10.0000 mg | DELAYED_RELEASE_TABLET | Freq: Every day | ORAL | Status: DC
  Administered 2019-01-18: 11:00:00 10 mg via ORAL
  Filled 2019-01-18: qty 2

## 2019-01-18 NOTE — Progress Notes (Signed)
Initial Nutrition Assessment  DOCUMENTATION CODES:   Not applicable  INTERVENTION:  Once diet advances provide Ensure Enlive po BID, each supplement provides 350 kcal and 20 grams of protein. Patient prefers chocolate.  Will monitor potassium and phosphorus and adjust oral nutrition supplement regimen as indicated.  NUTRITION DIAGNOSIS:   Increased nutrient needs related to catabolic illness(ESRD on HD, ovarian cancer, CHF) as evidenced by estimated needs.  GOAL:   Patient will meet greater than or equal to 90% of their needs  MONITOR:   PO intake, Supplement acceptance, Diet advancement, Labs, Weight trends, I & O's  REASON FOR ASSESSMENT:   Malnutrition Screening Tool    ASSESSMENT:   63 year old female with PMHx of esophageal reflux, vitamin D deficiency, anxiety, asthma, CHF, HTN, advanced ovarian cancer, ESRD on HD, paroxysmal A-fib admitted with constipation.   Met with patient and her sister at bedside. Patient reports her appetite and intake have been decreased for a while now. She attempts to eat 2-3 meals per day but is only eating small amounts. She reports some day she does not eat more than 200-300 kcal. She drinks oral nutrition supplements and enjoys chocolate Boost. She reports her potassium and phosphorus levels have been WNL. She reports she has been on HD 3-4 months. Patient reports she is hungry today and ready for more than liquids. Discussed that diet will be advanced by MD once appropriate.  Patient reports her UBW was 187 lbs back in 2018. She had slow weight loss but has been weight-stable for about the past year now. Her weight does trend up occasionally (likely from fluid) but she was around the same weight in 01/2018 as she is now.  Medications reviewed and include: amiodarone, Eliquis, Dulcolax, levothyroxine, pantoprazole.  Labs reviewed: Sodium 132, Chloride 94, BUN 54, Creatinine 4.8.  Patient does not meet criteria for malnutrition at this  time.  NUTRITION - FOCUSED PHYSICAL EXAM:    Most Recent Value  Orbital Region  No depletion  Upper Arm Region  Mild depletion  Thoracic and Lumbar Region  No depletion  Buccal Region  No depletion  Temple Region  Mild depletion  Clavicle Bone Region  No depletion  Clavicle and Acromion Bone Region  No depletion  Scapular Bone Region  No depletion  Dorsal Hand  No depletion  Patellar Region  No depletion  Anterior Thigh Region  No depletion  Posterior Calf Region  No depletion  Edema (RD Assessment)  None  Hair  Reviewed  Eyes  Reviewed  Mouth  Reviewed  Skin  Reviewed  Nails  Reviewed     Diet Order:   Diet Order            Diet clear liquid Room service appropriate? Yes; Fluid consistency: Thin  Diet effective now             EDUCATION NEEDS:   No education needs have been identified at this time  Skin:  Skin Assessment: Reviewed RN Assessment  Last BM:  PTA  Height:   Ht Readings from Last 1 Encounters:  01/17/19 5' 2" (1.575 m)   Weight:   Wt Readings from Last 1 Encounters:  01/17/19 68.8 kg   Ideal Body Weight:  50 kg  BMI:  Body mass index is 27.74 kg/m.  Estimated Nutritional Needs:   Kcal:  1700-1900  Protein:  85-95 grams  Fluid:  UOP + 1 L  Jacklynn Barnacle, MS, RD, LDN Office: [phone] Pager: [phone] After Hours/Weekend Pager: [phone]

## 2019-01-18 NOTE — Consult Note (Addendum)
[facility] SURGICAL CONSULTATION NOTE (initial) - cpt: 32202   HISTORY OF PRESENT ILLNESS (HPI):  63 y.o. female presented to  [facility] ED 10/31 for evaluation of constipation. Patient reports that at time of presentation she had not had a BM for ~9 days. She endorses associated diffuse abdominal discomfort, distension, or nausea. Tried senokot, lactulose, and multiple enemas (including in the ED) without relief. No fever, chills, SOB, CP. Typically has a BM every 2-3 days. Of note, patient has a history of metastatic ovarian cancer for which she was last seen in the cancer center on 08/26 and made the decision to stop all treatments. Ovarian CA surgically managed by Dr Fransisca Connors with [place]. She follows for palliative abdominal pleurex catheter drainage. She was instructed in her last visit on 10/26 to take Lactulose, senokot, and Miralax. Work up in the ED was concerning for colonic constipation and failed multiple enemas. As such, she was admitted to the medicine service. GI was consulted and tried bowel prep and dulcolax without improvement. Role for colonoscopy in this setting felt to be limited. As such, surgery is consulted by Dyanne Carrel, NP in this context for evaluation and management of constipation and concern for obstruction in the setting of ovarian cancer.  Marland Kitchen  PAST MEDICAL HISTORY (PMH):  Past Medical History:  Diagnosis Date  . Allergic rhinitis, cause unspecified   . Anxiety state, unspecified   . Arthritis   . Asthma    only when sick   . Backache, unspecified   . Bronchitis    hx of when get sick  . Cancer ([place])    skin cancer , basal cell   . Cancer ([place]) 11/2016   ovarian  . Cervicalgia   . CHF (congestive heart failure) ([place])   . Complication of anesthesia   . Dermatophytosis of nail   . Dysmetabolic syndrome X   . Encounter for long-term (current) use of other medications   . Esophageal reflux   . Hypertension   . Insomnia, unspecified   .  Leukocytosis, unspecified   . Migraine without aura, without mention of intractable migraine without mention of status migrainosus   . Other and unspecified hyperlipidemia   . Other malaise and fatigue   . Overweight(278.02)   . Personal history of chemotherapy now   ovarian  . Renal insufficiency   . Spinal stenosis in cervical region   . Symptomatic menopausal or female climacteric states   . Unspecified disorder of skin and subcutaneous tissue   . Unspecified vitamin D deficiency      PAST SURGICAL HISTORY ([place]):  Past Surgical History:  Procedure Laterality Date  . ABDOMINAL HYSTERECTOMY    . ANTERIOR CERVICAL DECOMP/DISCECTOMY FUSION N/A 06/07/2015   Procedure: Cervical three - four and Cervical six- seven anterior cervical decompression with fusion interbody prosthesis plating and bonegraft;  Surgeon: Newman Pies, MD;  Location: [place] [facility];  Service: Neurosurgery;  Laterality: N/A;  C34 and C67 anterior cervical decompression with fusion interbody prosthesis plating and bonegraft  . BACK SURGERY     x2 Lower   . DIALYSIS/PERMA CATHETER INSERTION N/A 08/17/2018   Procedure: DIALYSIS/PERMA CATHETER INSERTION;  Surgeon: Algernon Huxley, MD;  Location: [facility];  Service: Cardiovascular;  Laterality: N/A;  . EVACUATION OF CERVICAL HEMATOMA N/A 06/14/2015   Procedure: EVACUATION OF CERVICAL HEMATOMA;  Surgeon: Newman Pies, MD;  Location: [place] [facility];  Service: Neurosurgery;  Laterality: N/A;  . IR GUIDED DRAIN W CATHETER PLACEMENT  01/01/2019  .  NECK SURGERY     x3  . TONSILLECTOMY       MEDICATIONS:  Prior to Admission medications   Medication Sig Start Date End Date Taking? Authorizing Provider  albuterol (PROVENTIL HFA;VENTOLIN HFA) 108 (90 Base) MCG/ACT inhaler Inhale 2 puffs into the lungs every 6 (six) hours as needed for wheezing or shortness of breath. 10/04/16  Yes Hubbard Hartshorn, FNP  albuterol (PROVENTIL) (2.5 MG/3ML) 0.083% nebulizer solution Take 3 mLs  (2.5 mg total) by nebulization every 6 (six) hours as needed for wheezing or shortness of breath. 08/18/18  Yes Sowles, Drue Stager, MD  ALPRAZolam (XANAX) 0.25 MG tablet TAKE 1 TABLET(0.25 MG) BY MOUTH AT BEDTIME AS NEEDED FOR ANXIETY Patient taking differently: Take 0.25 mg by mouth at bedtime as needed for anxiety.  12/10/18  Yes Lloyd Huger, MD  amiodarone (PACERONE) 200 MG tablet Take 1 tablet (200 mg total) by mouth daily. 10/28/18  Yes End, Harrell Gave, MD  apixaban (ELIQUIS) 5 MG TABS tablet Take 1 tablet (5 mg total) by mouth 2 (two) times daily. 12/09/18 01/17/19 Yes End, Harrell Gave, MD  cetirizine (ZYRTEC) 10 MG tablet Take 10 mg by mouth daily.   Yes [provider]  Cholecalciferol (VITAMIN D-1000 MAX ST) 1000 UNITS tablet Take 1,000 Units by mouth 2 (two) times daily.    Yes [provider]  cyclobenzaprine (FLEXERIL) 10 MG tablet Take 1 tablet (10 mg total) by mouth every 8 (eight) hours as needed. 01/06/19  Yes Borders, Kirt Boys, NP  DULoxetine (CYMBALTA) 60 MG capsule Take 1 capsule (60 mg total) by mouth daily. 12/23/18  Yes Sowles, Drue Stager, MD  fentaNYL (DURAGESIC) 12 MCG/HR Place 1 patch onto the skin every 3 (three) days. 01/11/19  Yes Jacquelin Hawking, NP  ferrous sulfate (IRON SUPPLEMENT) 325 (65 FE) MG tablet Take 325 mg by mouth daily.    Yes [provider]  fluticasone (FLONASE) 50 MCG/ACT nasal spray Place 2 sprays into both nostrils as needed. Patient taking differently: Place 2 sprays into both nostrils daily as needed for allergies or rhinitis.  06/11/18 01/17/19 Yes Sowles, Drue Stager, MD  hydrALAZINE (APRESOLINE) 25 MG tablet Take 1 tablet (25 mg total) by mouth 3 (three) times daily. For BP if above 150/90 12/04/18  Yes Sowles, Drue Stager, MD  hydrOXYzine (ATARAX/VISTARIL) 10 MG tablet Take 10 mg by mouth 3 (three) times daily as needed for itching.    Yes [provider]  ipratropium (ATROVENT HFA) 17 MCG/ACT inhaler Inhale 2 puffs into the  lungs every 6 (six) hours as needed for wheezing.    Yes [provider]  isosorbide mononitrate (IMDUR) 30 MG 24 hr tablet Take 1 tablet (30 mg total) by mouth daily. 12/04/18  Yes Sowles, Drue Stager, MD  lactulose (CHRONULAC) 10 GM/15ML solution Take 15 mLs (10 g total) by mouth 2 (two) times daily as needed for mild constipation. 01/06/19  Yes Borders, Kirt Boys, NP  levothyroxine (SYNTHROID) 25 MCG tablet Take 1 tablet (25 mcg total) by mouth daily before breakfast. 12/03/18  Yes Finnegan, Kathlene November, MD  losartan (COZAAR) 100 MG tablet Take 1 tablet (100 mg total) by mouth daily. 10/15/18 01/17/19 Yes Pyreddy, Pavan, MD  Lysine 1000 MG TABS Take 1,000 mg by mouth daily.    Yes [provider]  Magnesium Oxide 400 (240 Mg) MG TABS Take 400 mg by mouth 2 (two) times daily.    Yes [provider]  metoCLOPramide (REGLAN) 5 MG tablet Take 1 tablet (5 mg total)  by mouth every 8 (eight) hours as needed for nausea or vomiting. 01/11/19 01/11/20 Yes Burns, Wandra Feinstein, NP  metoprolol succinate (TOPROL-XL) 50 MG 24 hr tablet Take 1 tablet (50 mg total) by mouth daily. Take with or immediately following a meal. 12/04/18 03/04/19 Yes Sowles, Drue Stager, MD  mometasone-formoterol (DULERA) 200-5 MCG/ACT AERO Inhale 2 puffs into the lungs 2 (two) times daily as needed for wheezing or shortness of breath.    Yes [provider]  Multiple Vitamins-Minerals (MULTIVITAMIN PO) Take 1 tablet by mouth daily.    Yes [provider]  Omega-3 Fatty Acids (FISH OIL) 1000 MG CAPS Take 1,000 capsules by mouth 2 (two) times daily.    Yes [provider]  ondansetron (ZOFRAN-ODT) 8 MG disintegrating tablet Take 1 tablet (8 mg total) by mouth every 8 (eight) hours as needed for nausea or vomiting. 07/20/18  Yes Borders, Kirt Boys, NP  oxyCODONE-acetaminophen (PERCOCET) 10-325 MG tablet Take 1 tablet by mouth every 4 (four) hours as needed for pain. 01/01/19  Yes Borders, Kirt Boys, NP   pantoprazole (PROTONIX) 40 MG tablet Take 1 tablet (40 mg total) by mouth daily. 01/11/19  Yes Burns, Wandra Feinstein, NP  polyethylene glycol powder (GLYCOLAX/MIRALAX) 17 GM/SCOOP powder Take 17 g by mouth daily as needed for mild constipation or moderate constipation.    Yes [provider]  senna (SENOKOT) 8.6 MG TABS tablet Take 1-2 tablets (8.6-17.2 mg total) by mouth 2 (two) times daily. Patient taking differently: Take 2 tablets by mouth 2 (two) times daily.  01/06/19  Yes Borders, Kirt Boys, NP  SUMAtriptan (IMITREX) 100 MG tablet Take 1 tablet (100 mg total) by mouth daily as needed for migraine. May repeat in 2 hours if headache persists or recurs. 12/23/18  Yes Sowles, Drue Stager, MD  temazepam (RESTORIL) 30 MG capsule Take 1 capsule (30 mg total) by mouth at bedtime as needed for sleep. 12/16/18  Yes Borders, Kirt Boys, NP  torsemide (DEMADEX) 20 MG tablet Take 2 tablets (40 mg total) by mouth 2 (two) times daily. 12/23/18 01/22/19 Yes Sowles, Drue Stager, MD  vitamin B-12 (CYANOCOBALAMIN) 1000 MCG tablet Take 1,000 mcg by mouth daily.   Yes [provider]  vitamin C (ASCORBIC ACID) 500 MG tablet Take 500 mg by mouth daily.   Yes [provider]  lidocaine (XYLOCAINE) 2 % solution Use as directed 15 mLs in the mouth or throat as needed for mouth pain. Patient not taking: Reported on 01/17/2019 11/28/17   Jacquelin Hawking, NP  prochlorperazine (COMPAZINE) 10 MG tablet Take 1 tablet (10 mg total) by mouth every 6 (six) hours as needed (Nausea or vomiting). 12/02/16 08/08/17  Lloyd Huger, MD     ALLERGIES:  No Known Allergies   SOCIAL HISTORY:  Social History   Socioeconomic History  . Marital status: Single    Spouse name: Not on file  . Number of children: 0  . Years of education: some college  . Highest education level: 12th grade  Occupational History    Employer: DISABLED  . Occupation: Disabled   Social Needs  . Financial resource strain: Somewhat hard   . Food insecurity    Worry: Never true    Inability: Never true  . Transportation needs    Medical: No    Non-medical: No  Tobacco Use  . Smoking status: Former Smoker    Packs/day: 1.50    Years: 20.00    Pack years: 30.00    Types: Cigarettes  Start date: 03/19/1979    Quit date: 08/25/1999    Years since quitting: 19.4  . Smokeless tobacco: Never Used  . Tobacco comment: smoking cessation materials not required  Substance and Sexual Activity  . Alcohol use: Not Currently    Alcohol/week: 0.0 standard drinks  . Drug use: No  . Sexual activity: Never  Lifestyle  . Physical activity    Days per week: 0 days    Minutes per session: 0 min  . Stress: Very much  Relationships  . Social Herbalist on phone: Patient refused    Gets together: Patient refused    Attends religious service: Patient refused    Active member of club or organization: Patient refused    Attends meetings of clubs or organizations: Patient refused    Relationship status: Patient refused  . Intimate partner violence    Fear of current or ex partner: No    Emotionally abused: No    Physically abused: No    Forced sexual activity: No  Other Topics Concern  . Not on file  Social History Narrative   Patient is single.    Patient lives with roommates.    Patient on disability    Patient has no children.    Patient has some college      FAMILY HISTORY:  Family History  Problem Relation Age of Onset  . Depression Mother   . Migraines Mother   . Dementia Father   . Diabetes Father   . Hyperlipidemia Father   . Hyperlipidemia Brother   . Hyperlipidemia Brother   . Breast cancer Paternal Aunt        72s      REVIEW OF SYSTEMS:  Review of Systems  Constitutional: Negative for chills and fever.  HENT: Negative for congestion and sore throat.   Respiratory: Negative for cough and shortness of breath.   Cardiovascular: Negative for chest pain and palpitations.  Gastrointestinal:  Positive for abdominal pain, constipation and nausea. Negative for diarrhea and vomiting.  All other systems reviewed and are negative.   VITAL SIGNS:  Temp:  [97.6 F (36.4 C)-97.9 F (36.6 C)] 97.9 F (36.6 C) (11/02 0413) Pulse Rate:  [70-77] 77 (11/02 0413) Resp:  [16-18] 16 (11/02 0413) BP: (110-154)/(72-88) 110/72 (11/02 0413) SpO2:  [96 %-100 %] 98 % (11/02 0413) Weight:  [68.8 kg] 68.8 kg (11/01 2043)     Height: 5\' 2"  (157.5 cm) Weight: 68.8 kg BMI (Calculated): 27.73   INTAKE/OUTPUT:  No intake/output data recorded.  PHYSICAL EXAM:  Physical Exam Vitals signs and nursing note reviewed. Exam conducted with a chaperone present.  Constitutional:      General: She is not in acute distress.    Appearance: Normal appearance. She is normal weight. She is not ill-appearing.  HENT:     Head: Normocephalic and atraumatic.  Eyes:     General: No scleral icterus.    Conjunctiva/sclera: Conjunctivae normal.  Cardiovascular:     Rate and Rhythm: Normal rate and regular rhythm.     Pulses: Normal pulses.     Heart sounds: No murmur. No friction rub. No gallop.   Pulmonary:     Effort: Pulmonary effort is normal. No respiratory distress.     Breath sounds: Normal breath sounds. No wheezing or rhonchi.  Abdominal:     General: Abdomen is protuberant. A surgical scar is present. There is no distension.     Tenderness: There is generalized abdominal tenderness (mild). There  is no guarding or rebound.     Comments: Abdomen is grossly distended, diffuse mild tenderness, no evidence of peritonitis, pleurX catheter in right mid abdomen, draining ascites. Large well healed laparotomy incision  Genitourinary:    Comments: Deferred Skin:    General: Skin is warm and dry.     Coloration: Skin is not pale.     Findings: No erythema.  Neurological:     General: No focal deficit present.     Mental Status: She is alert and oriented to person, place, and time.  Psychiatric:         Behavior: Behavior normal.     Comments: Tearful      Labs:  CBC Latest Ref Rng & Units 01/17/2019 01/16/2019 01/01/2019  WBC 4.0 - 10.5 K/uL 8.0 9.2 6.3  Hemoglobin 12.0 - 15.0 g/dL 10.8(L) 11.7(L) 10.3(L)  Hematocrit 36.0 - 46.0 % 35.4(L) 37.4 32.5(L)  Platelets 150 - 400 K/uL 344 292 273   CMP Latest Ref Rng & Units 01/17/2019 01/16/2019 11/03/2018  Glucose 70 - 99 mg/dL 80 90 111(H)  BUN 8 - 23 mg/dL 54(H) 48(H) 15  Creatinine 0.44 - 1.00 mg/dL 4.80(H) 4.54(H) 2.16(H)  Sodium 135 - 145 mmol/L 132(L) 132(L) 137  Potassium 3.5 - 5.1 mmol/L 3.9 4.2 4.1  Chloride 98 - 111 mmol/L 94(L) 94(L) 97(L)  CO2 22 - 32 mmol/L 28 25 30   Calcium 8.9 - 10.3 mg/dL 8.6(L) 8.8(L) 8.2(L)  Total Protein 6.5 - 8.1 g/dL - 6.3(L) 7.0  Total Bilirubin 0.3 - 1.2 mg/dL - 0.6 0.5  Alkaline Phos 38 - 126 U/L - 211(H) 71  AST 15 - 41 U/L - 54(H) 17  ALT 0 - 44 U/L - 35 10     Imaging studies:   CT Abdomen/Pelvis (01/16/2019) personally reviewed which shows significant colonic stool burden, ascites with catheter present. Radiologist report also reviewed:  IMPRESSION: 1. Severe fecal loading from the cecum through the mid sigmoid colon of uncertain etiology. 2. The bilateral pleural effusions are smaller in the interval but persist. 3. The amount of ascites is significantly increased in the interval. 4. Evaluation for omental/peritoneal metastases is limited due to lack of contrast. However, there appears to be omental/peritoneal disease on the left which is more prominent the interval. 5. Mild atherosclerotic change in the abdominal aorta.   Assessment/Plan: (ICD-10's: K59.00) 63 y.o. female with severe, almost pan-colonic, constipation which may be multi-factorial including chronic narcotic use, inadequate bowel regimen, or mechanical compression attributable to CA, although the latter is difficult to rule out as imaging studies are without contrast, which is complicated by her history of metastatic  ovarian CA and multiple co-morbid conditions.    - Despite renal failure, would recommend repeating CT with IV contrast to better evaluate for peritoneal metastasis/carcinomatosis causing extrinsic bowel obstruction.   - Recommend continued aggressive bowel regimen; consider adding sorbitol PO +/- rectally, enemas as tolerates, lactulose, etc.  Avoid Fleets due to ESRD.  - Could consider gastrografin enema to evaluate for distal obstruction, although in this situation likely has low yield  - Recommend involving gyn-onc, which would also be the most appropriate service to manage any potential surgical intervention, if one is indicated  - No indication for emergent surgical management without definitive source of obstruction; question if this is truly mechanical obstruction.   - Further management per primary service  All of the above findings and recommendations were discussed with the patient, and all of patient's questions were answered to her expressed  satisfaction.  Thank you for the opportunity to participate in this patient's care.   -- Edison Simon, PA-C [facility] 01/18/2019, 12:06 PM [id number] M-F: 7am - 4pm   I saw and evaluated the patient.  I agree with the above documentation, exam, and plan, which I have edited where appropriate. Fredirick Maudlin  3:16 PM

## 2019-01-18 NOTE — Progress Notes (Signed)
   01/18/19 1432  Neurological  Level of Consciousness Alert  Orientation Level Oriented X4  Respiratory  Respiratory Pattern Regular;Unlabored  Cardiac  Pulse Regular  ECG Monitor Yes  PT ARRIVED TO UNIT IN BED NO C/OS AND NO DISTRESS NOTED VITALS STABLE CVC WDL UFG 2L

## 2019-01-18 NOTE — Progress Notes (Signed)
[place], [place] 01/18/19  Subjective:  Patient still constipated. Due for next dialysis session today. Currently draining ascites.   Objective:  Vital signs in last 24 hours:  Temp:  [97.6 F (36.4 C)-97.9 F (36.6 C)] 97.9 F (36.6 C) (11/02 0413) Pulse Rate:  [70-77] 77 (11/02 0413) Resp:  [16-18] 16 (11/02 0413) BP: (110-154)/(72-88) 110/72 (11/02 0413) SpO2:  [96 %-100 %] 98 % (11/02 0413) Weight:  [68.8 kg] 68.8 kg (11/01 2043)  Weight change: 0.76 kg Filed Weights   01/16/19 1250 01/17/19 2043  Weight: 68 kg 68.8 kg    Intake/Output:    Intake/Output Summary (Last 24 hours) at 01/18/2019 1500 Last data filed at 01/18/2019 1300 Gross per 24 hour  Intake 1020 ml  Output 700 ml  Net 320 ml     Physical Exam: General:  No acute distress, laying in the bed  HEENT  anicteric, moist oral mucous membranes  Pulm/lungs  room air, normal breathing effort, clear to auscultation  CVS/Heart  regular, no rub  Abdomen:   Soft, nontender, distension noted  Extremities:  2+ pitting edema  Neurologic:  Alert, oriented  Skin:  Bilateral lower extremity erythema  Access:  Left IJ PermCath, PD catheter in place       Basic Metabolic Panel:  Recent Labs  Lab 01/16/19 1256 01/17/19 0512  NA 132* 132*  K 4.2 3.9  CL 94* 94*  CO2 25 28  GLUCOSE 90 80  BUN 48* 54*  CREATININE 4.54* 4.80*  CALCIUM 8.8* 8.6*     CBC: Recent Labs  Lab 01/16/19 1256 01/17/19 0512  WBC 9.2 8.0  HGB 11.7* 10.8*  HCT 37.4 35.4*  MCV 93.7 93.4  PLT 292 344      Lab Results  Component Value Date   HEPBSAG Negative 08/10/2018   HEPBSAB Reactive 08/10/2018      Microbiology:  Recent Results (from the past 240 hour(s))  SARS CORONAVIRUS 2 (TAT 6-24 HRS) Nasopharyngeal Nasopharyngeal Swab     Status: None   Collection Time: 01/17/19  8:19 PM   Specimen: Nasopharyngeal Swab  Result Value Ref Range Status   SARS Coronavirus 2 NEGATIVE  NEGATIVE Final    Comment: (NOTE) SARS-CoV-2 target nucleic acids are NOT DETECTED. The SARS-CoV-2 RNA is generally detectable in upper and lower respiratory specimens during the acute phase of infection. Negative results do not preclude SARS-CoV-2 infection, do not rule out co-infections with other pathogens, and should not be used as the sole basis for treatment or other patient management decisions. Negative results must be combined with clinical observations, patient history, and epidemiological information. The expected result is Negative. Fact Sheet for Patients: SugarRoll.be Fact Sheet for Healthcare Providers: https://www.woods-mathews.com/ This test is not yet approved or cleared by the [place] FDA and  has been authorized for detection and/or diagnosis of SARS-CoV-2 by FDA under an Emergency Use Authorization (EUA). This EUA will remain  in effect (meaning this test can be used) for the duration of the COVID-19 declaration under Section 56 4(b)(1) of the Act, 21 U.S.C. section 360bbb-3(b)(1), unless the authorization is terminated or revoked sooner. Performed at [facility], [place] [address] [place], [place] [address]   MRSA PCR Screening     Status: None   Collection Time: 01/17/19 10:41 PM   Specimen: Nasal Mucosa; Nasopharyngeal  Result Value Ref Range Status   MRSA by PCR NEGATIVE NEGATIVE Final    Comment:  The GeneXpert MRSA Assay (FDA approved for NASAL specimens only), is one component of a comprehensive MRSA colonization surveillance program. It is not intended to diagnose MRSA infection nor to guide or monitor treatment for MRSA infections. Performed at [facility], [address] [place], [place] [address]     Coagulation Studies: No results for input(s): LABPROT, INR in the last 72 hours.  Urinalysis: Recent Labs    01/16/19 1256  COLORURINE YELLOW*  LABSPEC 1.016  PHURINE  5.0  GLUCOSEU NEGATIVE  HGBUR NEGATIVE  BILIRUBINUR NEGATIVE  KETONESUR NEGATIVE  PROTEINUR NEGATIVE  NITRITE NEGATIVE  LEUKOCYTESUR NEGATIVE      Imaging: Ct Abdomen Pelvis Wo Contrast  Result Date: 01/16/2019 CLINICAL DATA:  No bowel movement 9 days.  Stage IV ovarian cancer. EXAM: CT ABDOMEN AND PELVIS WITHOUT CONTRAST TECHNIQUE: Multidetector CT imaging of the abdomen and pelvis was performed following the standard protocol without IV contrast. COMPARISON:  November 06, 2018 FINDINGS: Lower chest: Bilateral pleural effusions persist but are smaller in the interval. There is fluid adjacent to the distal esophagus extending from the abdomen. Hepatobiliary: The patient is status post cholecystectomy. The liver is a otherwise unremarkable on this unenhanced study. Common bile duct dilatation appears similar, likely due to previous cholecystectomy. Pancreas: The pancreas is poorly evaluated due to diffuse ascites and lack of contrast. No obvious pancreatic abnormality identified. Spleen: Normal in size without focal abnormality. Adrenals/Urinary Tract: Adrenal glands are normal. No renal stones or obstruction identified. No ureteral stones are noted. The bladder is unremarkable. Stomach/Bowel: The stomach and small bowel are normal. Severe fecal loading extends from the cecum into the mid sigmoid colon. An underlying cause is not seen. No pneumatosis noted. The appendix is not visualized but there is no secondary evidence of appendicitis. Vascular/Lymphatic: Mild atherosclerotic changes seen in the abdominal aorta. No definite adenopathy. Reproductive: Status post hysterectomy. No adnexal masses. Other: Severe ascites throughout the abdomen is significantly worsened in the interval. The peritoneal drain appears to be in good position, terminating in the left upper quadrant. It is difficult to evaluate for peritoneal or omental disease given the lack of contrast and the presence of ascites. However, there  does appear to be omental/peritoneal nodularity on the left such as on axial image 37, more prominent the interval. Musculoskeletal: No acute or significant osseous findings. IMPRESSION: 1. Severe fecal loading from the cecum through the mid sigmoid colon of uncertain etiology. 2. The bilateral pleural effusions are smaller in the interval but persist. 3. The amount of ascites is significantly increased in the interval. 4. Evaluation for omental/peritoneal metastases is limited due to lack of contrast. However, there appears to be omental/peritoneal disease on the left which is more prominent the interval. 5. Mild atherosclerotic change in the abdominal aorta. Electronically Signed   By: Dorise Bullion III M.D   On: 01/16/2019 16:21     Medications:    . amiodarone  200 mg Oral Daily  . apixaban  5 mg Oral BID  . bisacodyl  10 mg Oral Daily  . Chlorhexidine Gluconate Cloth  6 each Topical Q0600  . hydrALAZINE  25 mg Oral TID  . levothyroxine  25 mcg Oral QAC breakfast  . losartan  100 mg Oral Daily  . pantoprazole  40 mg Oral Daily   acetaminophen **OR** acetaminophen, ondansetron **OR** ondansetron (ZOFRAN) IV, oxyCODONE-acetaminophen, temazepam  Assessment/ Plan:  63 y.o. female with metastatic ovarian cancer, end-stage renal disease on hemodialysis, paroxysmal atrial fibrillation, ascites, hypothyroidism, GERD  Principal Problem:   Constipation Active Problems:   Gastro-esophageal reflux disease without esophagitis   Ovarian cancer, unspecified laterality ([place])   Hypothyroidism due to medication   HTN (hypertension)   ESRD on dialysis (HCC)   (HFpEF) heart failure with preserved ejection fraction (HCC)   Paroxysmal atrial fibrillation (HCC)   Ascites   Peritoneal disease  [facility] [address]/left IJ PermCath/67 kg/TTS 1  #.  End-stage renal disease -Patient due for extra dialysis treatment today as she missed her treatment on Saturday.  We will plan to complete dialysis  treatment today and resume normal dialysis treatment tomorrow.  #. Anemia of CKD  Lab Results  Component Value Date   HGB 10.8 (L) 01/17/2019  Hemoglobin currently 10.8.  Hold off on Epogen for now.  #. SHPTH    Component Value Date/Time   PTH 148 (H) 08/10/2018 1640   Lab Results  Component Value Date   PHOS 3.1 10/13/2018  Continue to monitor abdominal metabolism parameters.  #Severe constipation -CT abdomen with oral contrast shows stool burden and ascites - treatment as per hospitalist and GI.  #Ascites, metastatic ovarian cancer -Ascites can be drained via PD catheter.  Patient will request when she feels abdominal distention    LOS: 2 Helix Lafontaine 11/2/20203:00 PM  [facility] [place], [place]

## 2019-01-18 NOTE — Progress Notes (Signed)
Patient refuses soap suds enema. Patient educated on the purpose of the enemas.

## 2019-01-18 NOTE — Progress Notes (Signed)
PROGRESS NOTE    Jessica Burgess  UXL:[id number] DOB: [birth year] DOA: 01/16/2019 PCP: Jessica Sizer, MD   Brief Narrative: Ms. Jessica Burgess, 63 year old lady with past medical history significant for progressive ovarian cancer, hypertension, ESRD on dialysis (Tuesday, Thursday and Saturday) and paroxysmal atrial fibrillation came to [facility] ED with complaints of worsening constipation and abdominal pain for 9 days.  She failed lactulose and multiple enemas.  She was started on bowel prep with no bowel movements yet.   Assessment & Plan:   Principal Problem:   Constipation Active Problems:   Gastro-esophageal reflux disease without esophagitis   Ovarian cancer, unspecified laterality (HCC)   Hypothyroidism due to medication   HTN (hypertension)   ESRD on dialysis (HCC)   (HFpEF) heart failure with preserved ejection fraction (HCC)   Paroxysmal atrial fibrillation (HCC)   Ascites   Peritoneal disease  Constipation.   Most likely multifactorial including progression of her ovarian cancer and use of narcotics.  No success with multiple laxatives which includes bowel prep and multiple enemas. GI was consulted and does not think that colonoscopy or manual disimpaction is an option as stool burden is mostly in proximal colon.  They suggest continuing bowel prep and surgical consult. Surgery was consulted and they are advising to get a CT abdomen and pelvis with contrast for better evaluation even when patient is on dialysis.  They were also recommending to involve gynecologic oncology department at [facility] who performed her previous surgery.  Per surgery consult there is no immediate need for surgery. -Keep patient n.p.o. except drinking her bowel prep. -Continue bowel prep. -CT abdomen and pelvis with contrast was ordered. -Awaiting palliative consult.  Advanced ovarian cancer.  Most likely the cause of her bad constipation.  Oncology was consulted but patient does not want to restart  chemo.  She is not enrolled in hospice yet.  -Consult palliative care to discuss goals of care at this point. -Patient has a PD catheter in place for her ascites- which was drained today. ESRD.  She missed her dialysis on Saturday due to abdominal pain. -Nephrology was consulted and will be dialyzed today.  HTN.  Continue home antihypertensives.  Paroxysmal A. Fib.  Currently in sinus rhythm. -Continue home meds.  Hypothyroidism.  TSH checked in July 2020 was 3.7. -Continue home Synthroid.  DVT prophylaxis: Eliquis Code Status: Full Family Communication: Discussed with patient. Disposition Plan: Discharge in 2 to 3 days depending on goals of care discussion.  Consultants:   Nephrology  Oncology  GI  Palliative  Antimicrobials: None  Subjective: Patient continued to experience constipation despite giving bowel prep and multiple enemas.  She was tearful stating that she does not want to die of this backlog.  She is experiencing nausea but no vomiting.  Discussed again her wishes not to pursue any more chemotherapy.  Per patient she has been seen a palliative care person named Jessica Burgess at her cancer center in [place].  Objective: Vitals:   01/17/19 1544 01/17/19 1951 01/17/19 2043 01/18/19 0413  BP: (!) 154/87 (!) 145/85 140/88 110/72  Pulse: 70 76 74 77  Resp: 18 17 16 16   Temp:   97.6 F (36.4 C) 97.9 F (36.6 C)  TempSrc:   Oral   SpO2: 96% 98% 100% 98%  Weight:   68.8 kg   Height:   5\' 2"  (1.575 m)     Intake/Output Summary (Last 24 hours) at 01/18/2019 1605 Last data filed at 01/18/2019 1300 Gross per 24 hour  Intake  1020 ml  Output 700 ml  Net 320 ml   Filed Weights   01/16/19 1250 01/17/19 2043  Weight: 68 kg 68.8 kg    Examination:  General exam: Appears calm and comfortable  Respiratory system: Few basal crackles. Respiratory effort normal. Cardiovascular system: S1 & S2 heard, RRR. No JVD, murmurs, rubs, gallops or clicks. 2+ pedal  edema. Gastrointestinal system: Abdominal distention with mild diffuse tenderness, PD catheter in place, bowel sounds hypoactive. Central nervous system: Alert and oriented. No focal neurological deficits. Extremities: 2+ bilateral lower extremity edema, pulses intact. Skin: No rashes, lesions or ulcers Psychiatry: Judgement and insight appear normal. Mood & affect appropriate.   Data Reviewed: I have personally reviewed following labs and imaging studies  CBC: Recent Labs  Lab 01/16/19 1256 01/17/19 0512  WBC 9.2 8.0  HGB 11.7* 10.8*  HCT 37.4 35.4*  MCV 93.7 93.4  PLT 292 299   Basic Metabolic Panel: Recent Labs  Lab 01/16/19 1256 01/17/19 0512  NA 132* 132*  K 4.2 3.9  CL 94* 94*  CO2 25 28  GLUCOSE 90 80  BUN 48* 54*  CREATININE 4.54* 4.80*  CALCIUM 8.8* 8.6*   GFR: Estimated Creatinine Clearance: 10.9 mL/min (A) (by C-G formula based on SCr of 4.8 mg/dL (H)). Liver Function Tests: Recent Labs  Lab 01/16/19 1256  AST 54*  ALT 35  ALKPHOS 211*  BILITOT 0.6  PROT 6.3*  ALBUMIN 2.4*   No results for input(s): LIPASE, AMYLASE in the last 168 hours. No results for input(s): AMMONIA in the last 168 hours. Coagulation Profile: No results for input(s): INR, PROTIME in the last 168 hours. Cardiac Enzymes: No results for input(s): CKTOTAL, CKMB, CKMBINDEX, TROPONINI in the last 168 hours. BNP (last 3 results) No results for input(s): PROBNP in the last 8760 hours. HbA1C: No results for input(s): HGBA1C in the last 72 hours. CBG: No results for input(s): GLUCAP in the last 168 hours. Lipid Profile: No results for input(s): Burgess, HDL, LDLCALC, TRIG, CHOLHDL, LDLDIRECT in the last 72 hours. Thyroid Function Tests: No results for input(s): TSH, T4TOTAL, FREET4, T3FREE, THYROIDAB in the last 72 hours. Anemia Panel: No results for input(s): VITAMINB12, FOLATE, FERRITIN, TIBC, IRON, RETICCTPCT in the last 72 hours. Sepsis Labs: Recent Labs  Lab 01/16/19 1524   LATICACIDVEN 0.9    Recent Results (from the past 240 hour(s))  SARS CORONAVIRUS 2 (TAT 6-24 HRS) Nasopharyngeal Nasopharyngeal Swab     Status: None   Collection Time: 01/17/19  8:19 PM   Specimen: Nasopharyngeal Swab  Result Value Ref Range Status   SARS Coronavirus 2 NEGATIVE NEGATIVE Final    Comment: (NOTE) SARS-CoV-2 target nucleic acids are NOT DETECTED. The SARS-CoV-2 RNA is generally detectable in upper and lower respiratory specimens during the acute phase of infection. Negative results do not preclude SARS-CoV-2 infection, do not rule out co-infections with other pathogens, and should not be used as the sole basis for treatment or other patient management decisions. Negative results must be combined with clinical observations, patient history, and epidemiological information. The expected result is Negative. Fact Sheet for Patients: SugarRoll.be Fact Sheet for Healthcare Providers: https://www.woods-mathews.com/ This test is not yet approved or cleared by the [place] FDA and  has been authorized for detection and/or diagnosis of SARS-CoV-2 by FDA under an Emergency Use Authorization (EUA). This EUA will remain  in effect (meaning this test can be used) for the duration of the COVID-19 declaration under Section 56 4(b)(1) of the Act, 21  U.S.C. section 360bbb-3(b)(1), unless the authorization is terminated or revoked sooner. Performed at [facility], [place] [address], [place], [place] [address]   MRSA PCR Screening     Status: None   Collection Time: 01/17/19 10:41 PM   Specimen: Nasal Mucosa; Nasopharyngeal  Result Value Ref Range Status   MRSA by PCR NEGATIVE NEGATIVE Final    Comment:        The GeneXpert MRSA Assay (FDA approved for NASAL specimens only), is one component of a comprehensive MRSA colonization surveillance program. It is not intended to diagnose MRSA infection nor to guide or monitor  treatment for MRSA infections. Performed at [facility], [address] [place], [place] [address]      Radiology Studies: Ct Abdomen Pelvis Wo Contrast  Result Date: 01/16/2019 CLINICAL DATA:  No bowel movement 9 days.  Stage IV ovarian cancer. EXAM: CT ABDOMEN AND PELVIS WITHOUT CONTRAST TECHNIQUE: Multidetector CT imaging of the abdomen and pelvis was performed following the standard protocol without IV contrast. COMPARISON:  November 06, 2018 FINDINGS: Lower chest: Bilateral pleural effusions persist but are smaller in the interval. There is fluid adjacent to the distal esophagus extending from the abdomen. Hepatobiliary: The patient is status post cholecystectomy. The liver is a otherwise unremarkable on this unenhanced study. Common bile duct dilatation appears similar, likely due to previous cholecystectomy. Pancreas: The pancreas is poorly evaluated due to diffuse ascites and lack of contrast. No obvious pancreatic abnormality identified. Spleen: Normal in size without focal abnormality. Adrenals/Urinary Tract: Adrenal glands are normal. No renal stones or obstruction identified. No ureteral stones are noted. The bladder is unremarkable. Stomach/Bowel: The stomach and small bowel are normal. Severe fecal loading extends from the cecum into the mid sigmoid colon. An underlying cause is not seen. No pneumatosis noted. The appendix is not visualized but there is no secondary evidence of appendicitis. Vascular/Lymphatic: Mild atherosclerotic changes seen in the abdominal aorta. No definite adenopathy. Reproductive: Status post hysterectomy. No adnexal masses. Other: Severe ascites throughout the abdomen is significantly worsened in the interval. The peritoneal drain appears to be in good position, terminating in the left upper quadrant. It is difficult to evaluate for peritoneal or omental disease given the lack of contrast and the presence of ascites. However, there does appear to be  omental/peritoneal nodularity on the left such as on axial image 37, more prominent the interval. Musculoskeletal: No acute or significant osseous findings. IMPRESSION: 1. Severe fecal loading from the cecum through the mid sigmoid colon of uncertain etiology. 2. The bilateral pleural effusions are smaller in the interval but persist. 3. The amount of ascites is significantly increased in the interval. 4. Evaluation for omental/peritoneal metastases is limited due to lack of contrast. However, there appears to be omental/peritoneal disease on the left which is more prominent the interval. 5. Mild atherosclerotic change in the abdominal aorta. Electronically Signed   By: Dorise Bullion III M.D   On: 01/16/2019 16:21   Scheduled Meds:  amiodarone  200 mg Oral Daily   apixaban  5 mg Oral BID   bisacodyl  10 mg Oral Daily   Chlorhexidine Gluconate Cloth  6 each Topical Q0600   hydrALAZINE  25 mg Oral TID   levothyroxine  25 mcg Oral QAC breakfast   losartan  100 mg Oral Daily   pantoprazole  40 mg Oral Daily   Continuous Infusions:   LOS: 2 days   Time spent: 50 minutes  Lorella Nimrod, MD [facility]  Pager [phone]  If 7PM-7AM, please contact night-coverage [email or web address] Password [facility] 01/18/2019, 4:05 PM

## 2019-01-18 NOTE — Progress Notes (Signed)
Jessica Antigua, MD [address], [place], [place], [place], [address] [address], [place], [place], [place], [address] Phone: [phone]  Fax: [phone]   Subjective: Patient has drank half the colonoscopy prep, but has not had any bowel movements.  Is passing gas.   Objective: Exam: Vital signs in last 24 hours: Vitals:   01/17/19 1544 01/17/19 1951 01/17/19 2043 01/18/19 0413  BP: (!) 154/87 (!) 145/85 140/88 110/72  Pulse: 70 76 74 77  Resp: 18 17 16 16   Temp:   97.6 F (36.4 C) 97.9 F (36.6 C)  TempSrc:   Oral   SpO2: 96% 98% 100% 98%  Weight:   68.8 kg   Height:   5\' 2"  (1.575 m)    Weight change: 0.76 kg No intake or output data in the 24 hours ending 01/18/19 1027  General: No acute distress, AAO x3 Abd: Soft, nontender, peritoneal catheter in place,, No HSM Skin: Warm, no rashes Neck: Supple, Trachea midline   Lab Results: Lab Results  Component Value Date   WBC 8.0 01/17/2019   HGB 10.8 (L) 01/17/2019   HCT 35.4 (L) 01/17/2019   MCV 93.4 01/17/2019   PLT 344 01/17/2019   Micro Results: Recent Results (from the past 240 hour(s))  MRSA PCR Screening     Status: None   Collection Time: 01/17/19 10:41 PM   Specimen: Nasal Mucosa; Nasopharyngeal  Result Value Ref Range Status   MRSA by PCR NEGATIVE NEGATIVE Final    Comment:        The GeneXpert MRSA Assay (FDA approved for NASAL specimens only), is one component of a comprehensive MRSA colonization surveillance program. It is not intended to diagnose MRSA infection nor to guide or monitor treatment for MRSA infections. Performed at [facility], [address] [place], [place] [address]    Studies/Results: Ct Abdomen Pelvis Wo Contrast  Result Date: 01/16/2019 CLINICAL DATA:  No bowel movement 9 days.  Stage IV ovarian cancer. EXAM: CT ABDOMEN AND PELVIS WITHOUT CONTRAST TECHNIQUE: Multidetector CT imaging of the abdomen and pelvis was performed following the  standard protocol without IV contrast. COMPARISON:  November 06, 2018 FINDINGS: Lower chest: Bilateral pleural effusions persist but are smaller in the interval. There is fluid adjacent to the distal esophagus extending from the abdomen. Hepatobiliary: The patient is status post cholecystectomy. The liver is a otherwise unremarkable on this unenhanced study. Common bile duct dilatation appears similar, likely due to previous cholecystectomy. Pancreas: The pancreas is poorly evaluated due to diffuse ascites and lack of contrast. No obvious pancreatic abnormality identified. Spleen: Normal in size without focal abnormality. Adrenals/Urinary Tract: Adrenal glands are normal. No renal stones or obstruction identified. No ureteral stones are noted. The bladder is unremarkable. Stomach/Bowel: The stomach and small bowel are normal. Severe fecal loading extends from the cecum into the mid sigmoid colon. An underlying cause is not seen. No pneumatosis noted. The appendix is not visualized but there is no secondary evidence of appendicitis. Vascular/Lymphatic: Mild atherosclerotic changes seen in the abdominal aorta. No definite adenopathy. Reproductive: Status post hysterectomy. No adnexal masses. Other: Severe ascites throughout the abdomen is significantly worsened in the interval. The peritoneal drain appears to be in good position, terminating in the left upper quadrant. It is difficult to evaluate for peritoneal or omental disease given the lack of contrast and the presence of ascites. However, there does appear to be omental/peritoneal nodularity on the left such as on axial image 37, more prominent the  interval. Musculoskeletal: No acute or significant osseous findings. IMPRESSION: 1. Severe fecal loading from the cecum through the mid sigmoid colon of uncertain etiology. 2. The bilateral pleural effusions are smaller in the interval but persist. 3. The amount of ascites is significantly increased in the interval. 4.  Evaluation for omental/peritoneal metastases is limited due to lack of contrast. However, there appears to be omental/peritoneal disease on the left which is more prominent the interval. 5. Mild atherosclerotic change in the abdominal aorta. Electronically Signed   By: Dorise Bullion III M.D   On: 01/16/2019 16:21   Dg Abdomen 1 View  Result Date: 01/16/2019 CLINICAL DATA:  Constipation. EXAM: ABDOMEN - 1 VIEW COMPARISON:  January 05, 2019 FINDINGS: Severe fecal loading is seen throughout the length of the colon. A peritoneal drainage catheters again identified, terminating in the left upper quadrant. Pleural effusions are noted. No other acute abnormalities. IMPRESSION: Severe fecal loading in the colon. Small pleural effusions. Stable peritoneal drainage catheter. Electronically Signed   By: Dorise Bullion III M.D   On: 01/16/2019 14:59   Medications:  Scheduled Meds: . amiodarone  200 mg Oral Daily  . apixaban  5 mg Oral BID  . Chlorhexidine Gluconate Cloth  6 each Topical Q0600  . hydrALAZINE  25 mg Oral TID  . levothyroxine  25 mcg Oral QAC breakfast  . losartan  100 mg Oral Daily  . pantoprazole  40 mg Oral Daily   Continuous Infusions: PRN Meds:.acetaminophen **OR** acetaminophen, ondansetron **OR** ondansetron (ZOFRAN) IV, oxyCODONE-acetaminophen, temazepam   Assessment: Principal Problem:   Constipation Active Problems:   Gastro-esophageal reflux disease without esophagitis   Ovarian cancer, unspecified laterality (HCC)   Hypothyroidism due to medication   HTN (hypertension)   ESRD on dialysis (HCC)   (HFpEF) heart failure with preserved ejection fraction (HCC)   Paroxysmal atrial fibrillation (HCC)   Ascites   Peritoneal disease    Plan: CT scan report reviewed and showed stool burden extending from cecum to the sigmoid colon  Given the location of the stool burden, manual disimpaction is not likely to help, given that it is much more proximal to the rectum   Colonoscopy disimpaction is also unlikely to be successful given extensive stool burden in the more proximal colon and colonoscopy suction is not designed to suction that much stool  Would recommend continuing oral prep slowly as patient tolerates  Add stimulant laxative such as Dulcolax as well  Please obtain surgery consult.  Question if her symptoms are due to worsening of her ovarian cancer, and possible obstructive lesion.   LOS: 2 days   Jessica Antigua, MD 01/18/2019, 10:27 AM

## 2019-01-18 NOTE — Progress Notes (Signed)
This note also relates to the following rows which could not be included: Pulse Rate - Cannot attach notes to unvalidated device data Resp - Cannot attach notes to unvalidated device data BP - Cannot attach notes to unvalidated device data SpO2 - Cannot attach notes to unvalidated device data    01/18/19 1742  Vital Signs  Temp (!) 97.4 F (36.3 C)  Temp Source Oral  Pulse Rate Source Monitor  During Hemodialysis Assessment  Blood Flow Rate (mL/min) 400 mL/min  Arterial Pressure (mmHg) -190 mmHg  Venous Pressure (mmHg) 160 mmHg  Transmembrane Pressure (mmHg) 60 mmHg  Ultrafiltration Rate (mL/min) 830 mL/min  Dialysate Flow Rate (mL/min) 800 ml/min  Conductivity: Machine  13.8  HD Safety Checks Performed Yes  KECN 57.4 KECN  Dialysis Fluid Bolus Normal Saline  Bolus Amount (mL) 250 mL  Intra-Hemodialysis Comments Tolerated well;Tx completed  Post-Hemodialysis Assessment  Rinseback Volume (mL) 250 mL  KECN 57.4 V  Dialyzer Clearance Clear  Duration of HD Treatment -hour(s) 3 hour(s)  Hemodialysis Intake (mL) 500 mL  UF Total -Machine (mL) 2431 mL  Net UF (mL) 1931 mL  Tolerated HD Treatment Yes  pt tolerated HD TX well no c/os cvc wdl ufg 2L pt stable

## 2019-01-18 NOTE — Consult Note (Addendum)
[place]  [phone] [phone]   Name: Jessica Burgess Date: 01/18/2019 MRN: [id number]  DOB: July 14, 1955  Patient Care Team: Steele Sizer, MD as PCP - General (Family Medicine) End, Harrell Gave, MD as PCP - Cardiology (Cardiology) Lloyd Huger, MD as Consulting Physician (Oncology) Mellody Drown, MD as Consulting Physician (Obstetrics and Gynecology) Cathi Roan, [facility] (Pharmacist) Clent Jacks, RN as Registered Nurse    REASON FOR CONSULTATION: Palliative Care consult requested for [phone] y.o.femalewith multiple medical problems including stage IV high-grade serous ovarian carcinoma.She is status post debulking surgery at [facility] in 2018 and received one preoperative dose of Keytruda as part of a clinical trial. Patient completed adjuvant carboplatinum and Taxol, which was discontinued secondary to worsening neuropathy. Patient was also tried on Avastin and doxorubicin due to disease progression but was ultimately switched to gemcitabine secondary to declining performance status. Treatment was ultimately discontinued due to disease progression and intolerance.PMH is also notable for ESRD on hemodialysis, severe diastolic dysfunction with recurrent CHF, severe pulmonary HTN, and recurrent ascites s/p pleurx catheter. Patient was admitted to the hospital on 10/31 with severe constipation after failing outpatient management. Patient was referred to palliative care to help address symptoms and goals.  SOCIAL HISTORY:     reports that she quit smoking about 19 years ago. Her smoking use included cigarettes. She started smoking about 39 years ago. She has a 30.00 pack-year smoking history. She has never used smokeless tobacco. She reports previous alcohol use. She reports that she does not use drugs.   Patient is unmarried. Has no children. She lives at home with her sister. She also has two  brothers. Patient formerly worked at [employer] and then [employer] home improvement.  ADVANCE DIRECTIVES:  Sister is HCPOA  CODE STATUS:   PAST MEDICAL HISTORY: Past Medical History:  Diagnosis Date   Allergic rhinitis, cause unspecified    Anxiety state, unspecified    Arthritis    Asthma    only when sick    Backache, unspecified    Bronchitis    hx of when get sick   Cancer [facility])    skin cancer , basal cell    Cancer ([place]) 11/2016   ovarian   Cervicalgia    CHF (congestive heart failure) (HCC)    Complication of anesthesia    Dermatophytosis of nail    Dysmetabolic syndrome X    Encounter for long-term (current) use of other medications    Esophageal reflux    Hypertension    Insomnia, unspecified    Leukocytosis, unspecified    Migraine without aura, without mention of intractable migraine without mention of status migrainosus    Other and unspecified hyperlipidemia    Other malaise and fatigue    Overweight(278.02)    Personal history of chemotherapy now   ovarian   Renal insufficiency    Spinal stenosis in cervical region    Symptomatic menopausal or female climacteric states    Unspecified disorder of skin and subcutaneous tissue    Unspecified vitamin D deficiency     PAST SURGICAL HISTORY:  Past Surgical History:  Procedure Laterality Date   ABDOMINAL HYSTERECTOMY     ANTERIOR CERVICAL DECOMP/DISCECTOMY FUSION N/A 06/07/2015   Procedure: Cervical three - four and Cervical six- seven anterior cervical decompression with fusion interbody prosthesis plating and bonegraft;  Surgeon: Newman Pies, MD;  Location: [place] [facility];  Service: Neurosurgery;  Laterality: N/A;  C34 and C67  anterior cervical decompression with fusion interbody prosthesis plating and bonegraft   BACK SURGERY     x2 Lower    DIALYSIS/PERMA CATHETER INSERTION N/A 08/17/2018   Procedure: DIALYSIS/PERMA CATHETER INSERTION;  Surgeon: Algernon Huxley, MD;   Location: [place] CV LAB;  Service: Cardiovascular;  Laterality: N/A;   EVACUATION OF CERVICAL HEMATOMA N/A 06/14/2015   Procedure: EVACUATION OF CERVICAL HEMATOMA;  Surgeon: Newman Pies, MD;  Location: [place] [facility];  Service: Neurosurgery;  Laterality: N/A;   IR GUIDED DRAIN W CATHETER PLACEMENT  01/01/2019   NECK SURGERY     x3   TONSILLECTOMY      HEMATOLOGY/ONCOLOGY HISTORY:  Oncology History  Ovarian cancer, unspecified laterality ([place])  11/12/2016 Initial Diagnosis   Ovarian cancer, unspecified laterality ([place])   08/08/2017 - 12/07/2017 Chemotherapy   The patient had bevacizumab (AVASTIN) 700 mg in sodium chloride 0.9 % 100 mL chemo infusion, 725 mg, Intravenous,  Once, 4 of 6 cycles Administration: 700 mg (08/18/2017), 700 mg (09/01/2017), 700 mg (09/15/2017), 700 mg (09/29/2017), 700 mg (10/13/2017), 700 mg (10/27/2017), 700 mg (11/10/2017), 700 mg (11/24/2017) DOXOrubicin HCL LIPOSOMAL (DOXIL) 70 mg in dextrose 5 % 250 mL chemo infusion, 72 mg, Intravenous,  Once, 4 of 6 cycles Administration: 70 mg (08/18/2017), 70 mg (09/15/2017), 70 mg (10/13/2017), 70 mg (11/10/2017)  for chemotherapy treatment.    02/19/2018 -  Chemotherapy   The patient had gemcitabine (GEMZAR) 1,800 mg in sodium chloride 0.9 % 250 mL chemo infusion, 1,748 mg, Intravenous,  Once, 7 of 9 cycles Dose modification: 900 mg/m2 (original dose 1,000 mg/m2, Cycle 1, Reason: Other (see comments)) Administration: 1,800 mg (02/19/2018), 1,800 mg (02/26/2018), 1,600 mg (03/05/2018), 1,600 mg (03/23/2018), 1,600 mg (03/30/2018), 1,600 mg (04/06/2018), 1,600 mg (04/20/2018), 1,600 mg (04/27/2018), 1,600 mg (05/25/2018), 1,600 mg (06/01/2018), 1,600 mg (06/08/2018), 1,600 mg (06/22/2018), 1,600 mg (06/29/2018), 1,600 mg (07/13/2018), 1,600 mg (07/27/2018), 1,600 mg (09/24/2018), 1,600 mg (10/06/2018)  for chemotherapy treatment.      ALLERGIES:  has No Known Allergies.  MEDICATIONS:  Current Facility-Administered Medications  Medication Dose  Route Frequency Provider Last Rate Last Dose   acetaminophen (TYLENOL) tablet 650 mg  650 mg Oral Q6H PRN Lance Coon, MD       Or   acetaminophen (TYLENOL) suppository 650 mg  650 mg Rectal Q6H PRN Lance Coon, MD       amiodarone (PACERONE) tablet 200 mg  200 mg Oral Daily Lorella Nimrod, MD   200 mg at 01/18/19 0956   apixaban (ELIQUIS) tablet 5 mg  5 mg Oral BID Lance Coon, MD   5 mg at 01/18/19 0956   bisacodyl (DULCOLAX) EC tablet 10 mg  10 mg Oral Daily Vonda Antigua B, MD   10 mg at 01/18/19 1118   Chlorhexidine Gluconate Cloth 2 % PADS 6 each  6 each Topical Q0600 Murlean Iba, MD   6 each at 01/18/19 0506   heparin injection 1,400 Units  20 Units/kg Dialysis PRN Murlean Iba, MD       hydrALAZINE (APRESOLINE) tablet 25 mg  25 mg Oral TID Lorella Nimrod, MD   25 mg at 01/18/19 0956   levothyroxine (SYNTHROID) tablet 25 mcg  25 mcg Oral QAC breakfast Lance Coon, MD   25 mcg at 01/18/19 0505   losartan (COZAAR) tablet 100 mg  100 mg Oral Daily Lorella Nimrod, MD   100 mg at 01/18/19 0956   ondansetron (ZOFRAN) tablet 4 mg  4 mg Oral Q6H PRN Lance Coon, MD  Or   ondansetron (ZOFRAN) injection 4 mg  4 mg Intravenous Q6H PRN Lance Coon, MD       oxyCODONE-acetaminophen (PERCOCET/ROXICET) 5-325 MG per tablet 1 tablet  1 tablet Oral Q4H PRN Lavina Hamman, MD   1 tablet at 01/18/19 1026   pantoprazole (PROTONIX) EC tablet 40 mg  40 mg Oral Daily Lance Coon, MD   40 mg at 01/18/19 0956   temazepam (RESTORIL) capsule 30 mg  30 mg Oral QHS PRN Ena Dawley, RPH   30 mg at 01/17/19 2344   Facility-Administered Medications Ordered in Other Encounters  Medication Dose Route Frequency Provider Last Rate Last Dose   0.9 %  sodium chloride infusion   Intravenous Once Lloyd Huger, MD       0.9 %  sodium chloride infusion   Intravenous Once Lloyd Huger, MD       heparin lock flush 100 unit/mL  500 Units Intravenous Once Lloyd Huger, MD       sodium chloride flush (NS) 0.9 % injection 10 mL  10 mL Intravenous PRN Lloyd Huger, MD   10 mL at 06/16/17 0854   sodium chloride flush (NS) 0.9 % injection 10 mL  10 mL Intravenous Once Lloyd Huger, MD        VITAL SIGNS: BP 107/69    Pulse 83    Temp 98 F (36.7 C) (Oral)    Resp 17    Ht '5\' 2"'  (1.575 m)    Wt 151 lb 10.8 oz (68.8 kg)    SpO2 (!) 86%    BMI 27.74 kg/m  Filed Weights   01/16/19 1250 01/17/19 2043  Weight: 150 lb (68 kg) 151 lb 10.8 oz (68.8 kg)    Estimated body mass index is 27.74 kg/m as calculated from the following:   Height as of this encounter: '5\' 2"'  (1.575 m).   Weight as of this encounter: 151 lb 10.8 oz (68.8 kg).  LABS: CBC:    Component Value Date/Time   WBC 8.0 01/17/2019 0512   HGB 10.8 (L) 01/17/2019 0512   HCT 35.4 (L) 01/17/2019 0512   PLT 344 01/17/2019 0512   MCV 93.4 01/17/2019 0512   NEUTROABS 3.7 11/03/2018 1250   LYMPHSABS 1.0 11/03/2018 1250   MONOABS 1.0 11/03/2018 1250   EOSABS 0.7 (H) 11/03/2018 1250   BASOSABS 0.1 11/03/2018 1250   Comprehensive Metabolic Panel:    Component Value Date/Time   NA 132 (L) 01/17/2019 0512   NA 139 07/31/2015 1246   K 3.9 01/17/2019 0512   CL 94 (L) 01/17/2019 0512   CO2 28 01/17/2019 0512   BUN 54 (H) 01/17/2019 0512   BUN 15 07/31/2015 1246   CREATININE 4.80 (H) 01/17/2019 0512   CREATININE 0.90 05/17/2016 1052   GLUCOSE 80 01/17/2019 0512   CALCIUM 8.6 (L) 01/17/2019 0512   AST 54 (H) 01/16/2019 1256   AST 32 02/19/2012 0935   ALT 35 01/16/2019 1256   ALT 36 02/19/2012 0935   ALKPHOS 211 (H) 01/16/2019 1256   ALKPHOS 96 02/19/2012 0935   BILITOT 0.6 01/16/2019 1256   BILITOT 0.5 07/31/2015 1246   BILITOT 0.4 02/19/2012 0935   PROT 6.3 (L) 01/16/2019 1256   PROT 6.6 07/31/2015 1246   PROT 6.9 02/19/2012 0935   ALBUMIN 2.4 (L) 01/16/2019 1256   ALBUMIN 4.3 07/31/2015 1246   ALBUMIN 3.6 02/19/2012 0935    RADIOGRAPHIC STUDIES: Ct Abdomen Pelvis Wo  Contrast  Result Date: 01/16/2019 CLINICAL DATA:  No bowel movement 9 days.  Stage IV ovarian cancer. EXAM: CT ABDOMEN AND PELVIS WITHOUT CONTRAST TECHNIQUE: Multidetector CT imaging of the abdomen and pelvis was performed following the standard protocol without IV contrast. COMPARISON:  November 06, 2018 FINDINGS: Lower chest: Bilateral pleural effusions persist but are smaller in the interval. There is fluid adjacent to the distal esophagus extending from the abdomen. Hepatobiliary: The patient is status post cholecystectomy. The liver is a otherwise unremarkable on this unenhanced study. Common bile duct dilatation appears similar, likely due to previous cholecystectomy. Pancreas: The pancreas is poorly evaluated due to diffuse ascites and lack of contrast. No obvious pancreatic abnormality identified. Spleen: Normal in size without focal abnormality. Adrenals/Urinary Tract: Adrenal glands are normal. No renal stones or obstruction identified. No ureteral stones are noted. The bladder is unremarkable. Stomach/Bowel: The stomach and small bowel are normal. Severe fecal loading extends from the cecum into the mid sigmoid colon. An underlying cause is not seen. No pneumatosis noted. The appendix is not visualized but there is no secondary evidence of appendicitis. Vascular/Lymphatic: Mild atherosclerotic changes seen in the abdominal aorta. No definite adenopathy. Reproductive: Status post hysterectomy. No adnexal masses. Other: Severe ascites throughout the abdomen is significantly worsened in the interval. The peritoneal drain appears to be in good position, terminating in the left upper quadrant. It is difficult to evaluate for peritoneal or omental disease given the lack of contrast and the presence of ascites. However, there does appear to be omental/peritoneal nodularity on the left such as on axial image 37, more prominent the interval. Musculoskeletal: No acute or significant osseous findings. IMPRESSION:  1. Severe fecal loading from the cecum through the mid sigmoid colon of uncertain etiology. 2. The bilateral pleural effusions are smaller in the interval but persist. 3. The amount of ascites is significantly increased in the interval. 4. Evaluation for omental/peritoneal metastases is limited due to lack of contrast. However, there appears to be omental/peritoneal disease on the left which is more prominent the interval. 5. Mild atherosclerotic change in the abdominal aorta. Electronically Signed   By: Dorise Bullion III M.D   On: 01/16/2019 16:21   Dg Chest 2 View  Result Date: 01/05/2019 CLINICAL DATA:  Productive cough. Vomiting. Constipation. Metastatic ovarian cancer. EXAM: CHEST - 2 VIEW COMPARISON:  Chest x-ray dated 10/13/2018 and 10/10/2018 FINDINGS: Power port in place with the tip in the superior vena cava just below the level of the carina. Double lumen dialysis catheter in place with the tips at the cavoatrial junction. Heart size and pulmonary vascularity are normal. There are minimal bilateral pleural effusions. No discrete infiltrates. Minimal atelectasis at the left lung base. No acute bone abnormality. IMPRESSION: 1. Minimal bilateral pleural effusions. 2. Minimal atelectasis at the left lung base. 3. No discrete infiltrates. Electronically Signed   By: Lorriane Shire M.D.   On: 01/05/2019 13:01   Dg Abdomen 1 View  Result Date: 01/16/2019 CLINICAL DATA:  Constipation. EXAM: ABDOMEN - 1 VIEW COMPARISON:  January 05, 2019 FINDINGS: Severe fecal loading is seen throughout the length of the colon. A peritoneal drainage catheters again identified, terminating in the left upper quadrant. Pleural effusions are noted. No other acute abnormalities. IMPRESSION: Severe fecal loading in the colon. Small pleural effusions. Stable peritoneal drainage catheter. Electronically Signed   By: Dorise Bullion III M.D   On: 01/16/2019 14:59   Ir Guided Niel Hummer W Catheter Placement  Result Date:  01/01/2019 CLINICAL DATA:  History  of serous ovarian carcinoma and malignant ascites. Need for tunneled peritoneal drainage catheter for palliative care. EXAM: INSERTION OF TUNNELED PERITONEAL DRAINAGE CATHETER ANESTHESIA/SEDATION: 1.0 mg IV Versed; 75 mcg IV Fentanyl. Total Moderate Sedation Time 15 minutes. The patient's level of consciousness and physiologic status were continuously monitored during the procedure by Radiology nursing. MEDICATIONS: 2 g IV Ancef. Antibiotic was administered in an appropriate time interval for the procedure. FLUOROSCOPY TIME:  Less than 6 seconds.  1.5 mGy. PROCEDURE: The procedure, risks, benefits, and alternatives were explained to the patient. Questions regarding the procedure were encouraged and answered. The patient understands and consents to the procedure. A time-out was performed prior to initiating the procedure. The right abdominal wall was prepped with chlorhexidine in a sterile fashion, and a sterile drape was applied covering the operative field. A sterile gown and sterile gloves were used for the procedure. Local anesthesia was provided with 1% Lidocaine. Ultrasound image documentation was performed. Fluoroscopy during the procedure and fluoroscopic spot radiograph confirms appropriate catheter position. After creating a small skin incision, a 19 gauge needle was advanced into the peritoneal cavity under ultrasound guidance. A guide wire was then advanced under fluoroscopy into the peritoneal cavity. Peritoneal access was dilated serially and a 16-French peel-away sheath placed. A 15.5 French tunneled peritoneal PleurX catheter was placed. This was tunneled from an incision 5 cm below the peritoneal access to the access site. The catheter was advanced through the peel-away sheath. The sheath was then removed. Final catheter positioning was confirmed with a fluoroscopic spot image. The peritoneal access incision was closed with subcutaneous and subcuticular 4-0 Vicryl.  Dermabond was applied to the incision. A Prolene retention suture was applied at the catheter exit site. Large volume paracentesis was performed through the new catheter utilizing drainage bottles. COMPLICATIONS: None. FINDINGS: The catheter was placed via the right abdominal wall. Catheter course is to the left with the tip in the left upper quadrant. Approximately 3.5 liters of ascites was able to be removed after catheter placement. IMPRESSION: Placement of tunneled peritoneal drainage catheter via right abdominal approach. 3.5 liters of ascites was removed today after catheter placement. Electronically Signed   By: Aletta Edouard M.D.   On: 01/01/2019 12:02   US Paracentesis  Result Date: 12/21/2018 INDICATION: Recurrent ascites EXAM: ULTRASOUND GUIDED PARACENTESIS MEDICATIONS: None. COMPLICATIONS: None immediate. PROCEDURE: Informed written consent was obtained from the patient after a discussion of the risks, benefits and alternatives to treatment. A timeout was performed prior to the initiation of the procedure. Initial ultrasound scanning demonstrates a large amount of ascites within the right lower abdominal quadrant. The right lower abdomen was prepped and draped in the usual sterile fashion. 1% lidocaine was used for local anesthesia. Following this, a 6 Fr Safe-T-Centesis catheter was introduced. An ultrasound image was saved for documentation purposes. The paracentesis was performed. The catheter was removed and a dressing was applied. The patient tolerated the procedure well without immediate post procedural complication. Patient received post-procedure intravenous albumin; see nursing notes for details. FINDINGS: A total of approximately 2.5 L of clear yellow fluid was removed. Samples were sent to the laboratory as requested by the clinical team. IMPRESSION: Successful ultrasound-guided paracentesis yielding 2.5 liters of peritoneal fluid. Electronically Signed   By: Inez Catalina M.D.   On:  12/21/2018 15:24   Dg Abd 2 Views  Result Date: 01/05/2019 CLINICAL DATA:  Nausea and constipation. Productive cough. Metastatic ovarian cancer. Peritoneal drainage catheter. EXAM: ABDOMEN - 2 VIEW COMPARISON:  Radiograph  dated 08/10/2018 FINDINGS: There are no dilated loops of large or small bowel. There is extensive stool throughout the colon including in the rectum but there is no discrete fecal impaction. No significant air in the bowel. Ascites. Peritoneal drainage catheter tip is in the left upper quadrant. There are small bilateral pleural effusions. Surgical clips from previous cholecystectomy. No acute bone abnormality. IMPRESSION: 1. Extensive stool throughout the colon. No discrete fecal impaction. 2. No findings suggestive of bowel obstruction. 3. Small bilateral pleural effusions.  Ascites. Electronically Signed   By: Lorriane Shire M.D.   On: 01/05/2019 12:59    PERFORMANCE STATUS (ECOG) : 3 - Symptomatic, >50% confined to bed  Review of Systems Unless otherwise noted, a complete review of systems is negative.  Physical Exam General: frail appearing, thin Pulmonary: unlabored Abdomen: distended GU: no suprapubic tenderness Extremities: no edema, no joint deformities Skin: no rashes Neurological: Weakness but otherwise nonfocal  IMPRESSION: I met with patient while she was on dialysis.  She continues to have severe constipation.  Abdominal CT scan on 10/31 revealed a severe fecal load extending from cecum to the mid sigmoid colon without obvious obstruction.  Patient has subsequently been evaluated by GI and surgery.  She is felt to be a poor surgical candidate and likely would have limited benefit from colonoscopic disimpaction.  Patient has received an aggressive bowel regimen but has yet to have a response.  Today, patient reports having persistent abdominal distention and pain.  She has no nausea or vomiting and has tolerated p.o. intake. She has had no flatus today.    Agree with continued laxatives, suppositories, and enemas.  There is likely a component of reduced peristalsis due to chronic opioid use.  Would recommend trial of a peripheral opioid receptor antagonist such as subcutaneous methylnaltrexone if obstruction can be definitively ruled out.  Discussed with GI and hospitalist.  I also spoke with Dr. Grayland Ormond and GYN oncology NP.  I spoke with and updated patient's sister by phone.  Note that patient has previously desired DNR.  I have completed a MOST Form with her in the past and this is on file in [place]. She is currently listed as a full code and I will readdress this with her. I suspect that she continues to desire DNR.   We have previously had multiple conversations about her goals. She has not wanted systemic chemotherapy and instead wanted to focus on comfort and quality of life. We have talked about future hospice involvement when/if she reaches a point where she cannot tolerate or desires discontinuation of hemodialysis.  PLAN: -Continue current scope of treatment -Patient is pending contrasted CT -Recommend trial of subcutaneous methylnaltrexone if obstruction can be ruled out   Patient expressed understanding and was in agreement with this plan. She also understands that She can call the clinic at any time with any questions, concerns, or complaints.     Time Total: 60 minutes  Visit consisted of counseling and education dealing with the complex and emotionally intense issues of symptom management and palliative care in the setting of serious and potentially life-threatening illness.Greater than 50%  of this time was spent counseling and coordinating care related to the above assessment and plan.  Signed by: Altha Harm, PhD, NP-C

## 2019-01-18 NOTE — Progress Notes (Signed)
Off the floor to CT 

## 2019-01-19 DIAGNOSIS — E785 Hyperlipidemia, unspecified: Secondary | ICD-10-CM | POA: Diagnosis not present

## 2019-01-19 DIAGNOSIS — Z7901 Long term (current) use of anticoagulants: Secondary | ICD-10-CM | POA: Diagnosis not present

## 2019-01-19 DIAGNOSIS — D631 Anemia in chronic kidney disease: Secondary | ICD-10-CM | POA: Diagnosis not present

## 2019-01-19 DIAGNOSIS — K5900 Constipation, unspecified: Secondary | ICD-10-CM | POA: Diagnosis not present

## 2019-01-19 DIAGNOSIS — K5903 Drug induced constipation: Secondary | ICD-10-CM | POA: Diagnosis not present

## 2019-01-19 DIAGNOSIS — Z923 Personal history of irradiation: Secondary | ICD-10-CM | POA: Diagnosis not present

## 2019-01-19 DIAGNOSIS — R5381 Other malaise: Secondary | ICD-10-CM | POA: Diagnosis not present

## 2019-01-19 DIAGNOSIS — N250 Renal osteodystrophy: Secondary | ICD-10-CM | POA: Diagnosis not present

## 2019-01-19 DIAGNOSIS — E039 Hypothyroidism, unspecified: Secondary | ICD-10-CM | POA: Diagnosis not present

## 2019-01-19 DIAGNOSIS — Z87891 Personal history of nicotine dependence: Secondary | ICD-10-CM | POA: Diagnosis not present

## 2019-01-19 DIAGNOSIS — Z992 Dependence on renal dialysis: Secondary | ICD-10-CM | POA: Diagnosis not present

## 2019-01-19 DIAGNOSIS — G893 Neoplasm related pain (acute) (chronic): Secondary | ICD-10-CM | POA: Diagnosis not present

## 2019-01-19 DIAGNOSIS — K5902 Outlet dysfunction constipation: Secondary | ICD-10-CM

## 2019-01-19 DIAGNOSIS — J45909 Unspecified asthma, uncomplicated: Secondary | ICD-10-CM | POA: Diagnosis not present

## 2019-01-19 DIAGNOSIS — Z79899 Other long term (current) drug therapy: Secondary | ICD-10-CM | POA: Diagnosis not present

## 2019-01-19 DIAGNOSIS — R188 Other ascites: Secondary | ICD-10-CM | POA: Diagnosis not present

## 2019-01-19 DIAGNOSIS — I5032 Chronic diastolic (congestive) heart failure: Secondary | ICD-10-CM | POA: Diagnosis not present

## 2019-01-19 DIAGNOSIS — K56609 Unspecified intestinal obstruction, unspecified as to partial versus complete obstruction: Secondary | ICD-10-CM | POA: Diagnosis not present

## 2019-01-19 DIAGNOSIS — I5030 Unspecified diastolic (congestive) heart failure: Secondary | ICD-10-CM | POA: Diagnosis not present

## 2019-01-19 DIAGNOSIS — Z515 Encounter for palliative care: Secondary | ICD-10-CM | POA: Diagnosis not present

## 2019-01-19 DIAGNOSIS — Z8679 Personal history of other diseases of the circulatory system: Secondary | ICD-10-CM | POA: Diagnosis not present

## 2019-01-19 DIAGNOSIS — C800 Disseminated malignant neoplasm, unspecified: Secondary | ICD-10-CM | POA: Diagnosis not present

## 2019-01-19 DIAGNOSIS — I482 Chronic atrial fibrillation, unspecified: Secondary | ICD-10-CM | POA: Diagnosis not present

## 2019-01-19 DIAGNOSIS — R109 Unspecified abdominal pain: Secondary | ICD-10-CM | POA: Diagnosis not present

## 2019-01-19 DIAGNOSIS — K219 Gastro-esophageal reflux disease without esophagitis: Secondary | ICD-10-CM | POA: Diagnosis not present

## 2019-01-19 DIAGNOSIS — Z7189 Other specified counseling: Secondary | ICD-10-CM | POA: Diagnosis not present

## 2019-01-19 DIAGNOSIS — N186 End stage renal disease: Secondary | ICD-10-CM

## 2019-01-19 DIAGNOSIS — I4891 Unspecified atrial fibrillation: Secondary | ICD-10-CM | POA: Diagnosis not present

## 2019-01-19 DIAGNOSIS — G63 Polyneuropathy in diseases classified elsewhere: Secondary | ICD-10-CM | POA: Diagnosis not present

## 2019-01-19 DIAGNOSIS — C801 Malignant (primary) neoplasm, unspecified: Secondary | ICD-10-CM | POA: Diagnosis not present

## 2019-01-19 DIAGNOSIS — I480 Paroxysmal atrial fibrillation: Secondary | ICD-10-CM | POA: Diagnosis not present

## 2019-01-19 DIAGNOSIS — K669 Disorder of peritoneum, unspecified: Secondary | ICD-10-CM

## 2019-01-19 DIAGNOSIS — I10 Essential (primary) hypertension: Secondary | ICD-10-CM | POA: Diagnosis not present

## 2019-01-19 DIAGNOSIS — C786 Secondary malignant neoplasm of retroperitoneum and peritoneum: Secondary | ICD-10-CM | POA: Diagnosis not present

## 2019-01-19 DIAGNOSIS — I509 Heart failure, unspecified: Secondary | ICD-10-CM | POA: Diagnosis not present

## 2019-01-19 DIAGNOSIS — I272 Pulmonary hypertension, unspecified: Secondary | ICD-10-CM | POA: Diagnosis not present

## 2019-01-19 DIAGNOSIS — M5010 Cervical disc disorder with radiculopathy, unspecified cervical region: Secondary | ICD-10-CM | POA: Diagnosis not present

## 2019-01-19 DIAGNOSIS — K5909 Other constipation: Secondary | ICD-10-CM | POA: Diagnosis not present

## 2019-01-19 DIAGNOSIS — C569 Malignant neoplasm of unspecified ovary: Secondary | ICD-10-CM | POA: Diagnosis not present

## 2019-01-19 DIAGNOSIS — Z8619 Personal history of other infectious and parasitic diseases: Secondary | ICD-10-CM | POA: Diagnosis not present

## 2019-01-19 DIAGNOSIS — N2581 Secondary hyperparathyroidism of renal origin: Secondary | ICD-10-CM | POA: Diagnosis not present

## 2019-01-19 DIAGNOSIS — R180 Malignant ascites: Secondary | ICD-10-CM | POA: Diagnosis not present

## 2019-01-19 DIAGNOSIS — E032 Hypothyroidism due to medicaments and other exogenous substances: Secondary | ICD-10-CM | POA: Diagnosis not present

## 2019-01-19 LAB — COMPREHENSIVE METABOLIC PANEL
ALT: 23 U/L (ref 0–44)
AST: 29 U/L (ref 15–41)
Albumin: 2.1 g/dL — ABNORMAL LOW (ref 3.5–5.0)
Alkaline Phosphatase: 155 U/L — ABNORMAL HIGH (ref 38–126)
Anion gap: 9 (ref 5–15)
BUN: 29 mg/dL — ABNORMAL HIGH (ref 8–23)
CO2: 26 mmol/L (ref 22–32)
Calcium: 8.1 mg/dL — ABNORMAL LOW (ref 8.9–10.3)
Chloride: 93 mmol/L — ABNORMAL LOW (ref 98–111)
Creatinine, Ser: 3.36 mg/dL — ABNORMAL HIGH (ref 0.44–1.00)
GFR calc Af Amer: 16 mL/min — ABNORMAL LOW (ref >60)
GFR calc non Af Amer: 14 mL/min — ABNORMAL LOW (ref >60)
Glucose, Bld: 93 mg/dL (ref 70–99)
Potassium: 4.8 mmol/L (ref 3.5–5.1)
Sodium: 128 mmol/L — ABNORMAL LOW (ref 135–145)
Total Bilirubin: 0.3 mg/dL (ref 0.3–1.2)
Total Protein: 5.8 g/dL — ABNORMAL LOW (ref 6.5–8.1)

## 2019-01-19 LAB — CBC WITH DIFFERENTIAL/PLATELET
Abs Immature Granulocytes: 0.03 10*3/uL (ref 0.00–0.07)
Basophils Absolute: 0 10*3/uL (ref 0.0–0.1)
Basophils Relative: 0 %
Eosinophils Absolute: 0.2 10*3/uL (ref 0.0–0.5)
Eosinophils Relative: 2 %
HCT: 36.2 % (ref 36.0–46.0)
Hemoglobin: 11.4 g/dL — ABNORMAL LOW (ref 12.0–15.0)
Immature Granulocytes: 0 %
Lymphocytes Relative: 15 %
Lymphs Abs: 1.1 10*3/uL (ref 0.7–4.0)
MCH: 28.9 pg (ref 26.0–34.0)
MCHC: 31.5 g/dL (ref 30.0–36.0)
MCV: 91.6 fL (ref 80.0–100.0)
Monocytes Absolute: 0.7 10*3/uL (ref 0.1–1.0)
Monocytes Relative: 10 %
Neutro Abs: 5.3 10*3/uL (ref 1.7–7.7)
Neutrophils Relative %: 73 %
Platelets: 318 10*3/uL (ref 150–400)
RBC: 3.95 MIL/uL (ref 3.87–5.11)
RDW: 15.8 % — ABNORMAL HIGH (ref 11.5–15.5)
WBC: 7.4 10*3/uL (ref 4.0–10.5)
nRBC: 0 % (ref 0.0–0.2)

## 2019-01-19 LAB — LACTIC ACID, PLASMA
Lactic Acid, Venous: 0.7 mmol/L (ref 0.5–1.9)
Lactic Acid, Venous: 0.8 mmol/L (ref 0.5–1.9)

## 2019-01-19 LAB — PROTIME-INR
INR: 1.5 — ABNORMAL HIGH (ref 0.8–1.2)
Prothrombin Time: 17.8 seconds — ABNORMAL HIGH (ref 11.4–15.2)

## 2019-01-19 LAB — PHOSPHORUS: Phosphorus: 3.4 mg/dL (ref 2.5–4.6)

## 2019-01-19 MED ORDER — MORPHINE SULFATE (PF) 2 MG/ML IV SOLN
2.0000 mg | INTRAVENOUS | Status: DC | PRN
  Filled 2019-01-19: qty 1

## 2019-01-19 MED ORDER — HYDROMORPHONE HCL 1 MG/ML IJ SOLN: 0.5000 mg | INTRAMUSCULAR | Status: DC

## 2019-01-19 MED ORDER — HYDROMORPHONE HCL 1 MG/ML IJ SOLN
2.0000 mg | Freq: Once | INTRAMUSCULAR | Status: AC
  Administered 2019-01-19: 14:00:00 2 mg via INTRAVENOUS
  Filled 2019-01-19: qty 2

## 2019-01-19 MED ORDER — OXYCODONE-ACETAMINOPHEN 5-325 MG PO TABS
1.0000 | ORAL_TABLET | ORAL | Status: DC | PRN
  Administered 2019-01-19: 1 via ORAL

## 2019-01-19 MED ORDER — HYDROMORPHONE HCL 1 MG/ML IJ SOLN
1.0000 mg | INTRAMUSCULAR | Status: DC | PRN
  Administered 2019-01-19 (×2): 1 mg via INTRAVENOUS
  Filled 2019-01-19 (×2): qty 1

## 2019-01-19 MED ORDER — SODIUM CHLORIDE 0.9 % IV SOLN
INTRAVENOUS | Status: DC
  Administered 2019-01-19: 09:00:00 via INTRAVENOUS

## 2019-01-19 NOTE — Progress Notes (Signed)
[place]  [phone] [phone]   Name: Jessica Burgess Date: 01/19/2019 MRN: [id number]  DOB: [birth year]  Patient Care Team: Steele Sizer, MD as PCP - General (Family Medicine) End, Harrell Gave, MD as PCP - Cardiology (Cardiology) Lloyd Huger, MD as Consulting Physician (Oncology) Mellody Drown, MD as Consulting Physician (Obstetrics and Gynecology) Cathi Roan, [facility] (Pharmacist) Clent Jacks, RN as Registered Nurse    REASON FOR CONSULTATION: Palliative Care consult requested for [phone] y.o.femalewith multiple medical problems including stage IV high-grade serous ovarian carcinoma.She is status post debulking surgery at [facility] in 2018 and received one preoperative dose of Keytruda as part of a clinical trial. Patient completed adjuvant carboplatinum and Taxol, which was discontinued secondary to worsening neuropathy. Patient was also tried on Avastin and doxorubicin due to disease progression but was ultimately switched to gemcitabine secondary to declining performance status. Treatment was ultimately discontinued due to disease progression and intolerance.PMH is also notable for ESRD on hemodialysis, severe diastolic dysfunction with recurrent CHF, severe pulmonary HTN, and recurrent ascites s/p pleurx catheter. Patient was admitted to the hospital on 10/31 with severe constipation after failing outpatient management. Patient was referred to palliative care to help address symptoms and goals.   CODE STATUS: DNR  PAST MEDICAL HISTORY: Past Medical History:  Diagnosis Date   Allergic rhinitis, cause unspecified    Anxiety state, unspecified    Arthritis    Asthma    only when sick    Backache, unspecified    Bronchitis    hx of when get sick   Cancer [facility])    skin cancer , basal cell    Cancer ([place]) 11/2016   ovarian   Cervicalgia    CHF (congestive heart  failure) (HCC)    Complication of anesthesia    Dermatophytosis of nail    Dysmetabolic syndrome X    Encounter for long-term (current) use of other medications    Esophageal reflux    Hypertension    Insomnia, unspecified    Leukocytosis, unspecified    Migraine without aura, without mention of intractable migraine without mention of status migrainosus    Other and unspecified hyperlipidemia    Other malaise and fatigue    Overweight(278.02)    Personal history of chemotherapy now   ovarian   Renal insufficiency    Spinal stenosis in cervical region    Symptomatic menopausal or female climacteric states    Unspecified disorder of skin and subcutaneous tissue    Unspecified vitamin D deficiency     PAST SURGICAL HISTORY:  Past Surgical History:  Procedure Laterality Date   ABDOMINAL HYSTERECTOMY     ANTERIOR CERVICAL DECOMP/DISCECTOMY FUSION N/A 06/07/2015   Procedure: Cervical three - four and Cervical six- seven anterior cervical decompression with fusion interbody prosthesis plating and bonegraft;  Surgeon: Newman Pies, MD;  Location: [place] [facility]URO ORS;  Service: Neurosurgery;  Laterality: N/A;  C34 and C67 anterior cervical decompression with fusion interbody prosthesis plating and bonegraft   BACK SURGERY     x2 Lower    DIALYSIS/PERMA CATHETER INSERTION N/A 08/17/2018   Procedure: DIALYSIS/PERMA CATHETER INSERTION;  Surgeon: Algernon Huxley, MD;  Location: [place] CV LAB;  Service: Cardiovascular;  Laterality: N/A;   EVACUATION OF CERVICAL HEMATOMA N/A 06/14/2015   Procedure: EVACUATION OF CERVICAL HEMATOMA;  Surgeon: Newman Pies, MD;  Location: [place] NEUR[facility] ORS;  Service: Neurosurgery;  Laterality: N/A;   IR [place]  01/01/2019   NECK SURGERY     x3   TONSILLECTOMY      HEMATOLOGY/ONCOLOGY HISTORY:  Oncology History  Ovarian cancer, unspecified laterality ([place])  11/12/2016 Initial Diagnosis   Ovarian cancer,  unspecified laterality ([place])   08/08/2017 - 12/07/2017 Chemotherapy   The patient had bevacizumab (AVASTIN) 700 mg in sodium chloride 0.9 % 100 mL chemo infusion, 725 mg, Intravenous,  Once, 4 of 6 cycles Administration: 700 mg (08/18/2017), 700 mg (09/01/2017), 700 mg (09/15/2017), 700 mg (09/29/2017), 700 mg (10/13/2017), 700 mg (10/27/2017), 700 mg (11/10/2017), 700 mg (11/24/2017) DOXOrubicin HCL LIPOSOMAL (DOXIL) 70 mg in dextrose 5 % 250 mL chemo infusion, 72 mg, Intravenous,  Once, 4 of 6 cycles Administration: 70 mg (08/18/2017), 70 mg (09/15/2017), 70 mg (10/13/2017), 70 mg (11/10/2017)  for chemotherapy treatment.    02/19/2018 -  Chemotherapy   The patient had gemcitabine (GEMZAR) 1,800 mg in sodium chloride 0.9 % 250 mL chemo infusion, 1,748 mg, Intravenous,  Once, 7 of 9 cycles Dose modification: 900 mg/m2 (original dose 1,000 mg/m2, Cycle 1, Reason: Other (see comments)) Administration: 1,800 mg (02/19/2018), 1,800 mg (02/26/2018), 1,600 mg (03/05/2018), 1,600 mg (03/23/2018), 1,600 mg (03/30/2018), 1,600 mg (04/06/2018), 1,600 mg (04/20/2018), 1,600 mg (04/27/2018), 1,600 mg (05/25/2018), 1,600 mg (06/01/2018), 1,600 mg (06/08/2018), 1,600 mg (06/22/2018), 1,600 mg (06/29/2018), 1,600 mg (07/13/2018), 1,600 mg (07/27/2018), 1,600 mg (09/24/2018), 1,600 mg (10/06/2018)  for chemotherapy treatment.      ALLERGIES:  has No Known Allergies.  MEDICATIONS:  Current Facility-Administered Medications  Medication Dose Route Frequency Provider Last Rate Last Dose   0.9 %  sodium chloride infusion   Intravenous Continuous Lorella Nimrod, MD 100 mL/hr at 01/19/19 0830     acetaminophen (TYLENOL) tablet 650 mg  650 mg Oral Q6H PRN Lance Coon, MD       Or   acetaminophen (TYLENOL) suppository 650 mg  650 mg Rectal Q6H PRN Lance Coon, MD       amiodarone (PACERONE) tablet 200 mg  200 mg Oral Daily Lorella Nimrod, MD   Stopped at 01/19/19 0933   apixaban (ELIQUIS) tablet 5 mg  5 mg Oral BID Lance Coon, MD   Stopped at  01/19/19 0935   Chlorhexidine Gluconate Cloth 2 % PADS 6 each  6 each Topical Q0600 Murlean Iba, MD   6 each at 01/19/19 0936   hydrALAZINE (APRESOLINE) tablet 25 mg  25 mg Oral TID Lorella Nimrod, MD   25 mg at 01/18/19 2314   HYDROmorphone (DILAUDID) injection 0.5 mg  0.5 mg Intravenous NOW Rossi Burdo, Kirt Boys, NP       HYDROmorphone (DILAUDID) injection 1 mg  1 mg Intravenous Q2H PRN Radene Gunning, NP   1 mg at 01/19/19 1255   levothyroxine (SYNTHROID) tablet 25 mcg  25 mcg Oral QAC breakfast Lance Coon, MD   25 mcg at 01/19/19 0532   losartan (COZAAR) tablet 100 mg  100 mg Oral Daily Lorella Nimrod, MD   Stopped at 01/19/19 0936   ondansetron (ZOFRAN) tablet 4 mg  4 mg Oral Q6H PRN Lance Coon, MD       Or   ondansetron [facility]) injection 4 mg  4 mg Intravenous Q6H PRN Lance Coon, MD       pantoprazole (PROTONIX) EC tablet 40 mg  40 mg Oral Daily Lance Coon, MD   Stopped at 01/19/19 0937   temazepam (RESTORIL) capsule 30 mg  30 mg Oral QHS PRN Hart Robinsons A, RPH   30 mg at  01/18/19 2314   Facility-Administered Medications Ordered in Other Encounters  Medication Dose Route Frequency Provider Last Rate Last Dose   0.9 %  sodium chloride infusion   Intravenous Once Lloyd Huger, MD       0.9 %  sodium chloride infusion   Intravenous Once Lloyd Huger, MD       heparin lock flush 100 unit/mL  500 Units Intravenous Once Lloyd Huger, MD       sodium chloride flush (NS) 0.9 % injection 10 mL  10 mL Intravenous PRN Lloyd Huger, MD   10 mL at 06/16/17 0854   sodium chloride flush (NS) 0.9 % injection 10 mL  10 mL Intravenous Once Lloyd Huger, MD        VITAL SIGNS: BP 113/71    Pulse 90    Temp 98.3 F (36.8 C)    Resp 18    Ht '5\' 2"'  (1.575 m)    Wt 151 lb 10.8 oz (68.8 kg)    SpO2 97%    BMI 27.74 kg/m  Filed Weights   01/16/19 1250 01/17/19 2043  Weight: 150 lb (68 kg) 151 lb 10.8 oz (68.8 kg)    Estimated body mass index is  27.74 kg/m as calculated from the following:   Height as of this encounter: '5\' 2"'  (1.575 m).   Weight as of this encounter: 151 lb 10.8 oz (68.8 kg).  LABS: CBC:    Component Value Date/Time   WBC 7.4 01/19/2019 0322   HGB 11.4 (L) 01/19/2019 0322   HCT 36.2 01/19/2019 0322   PLT 318 01/19/2019 0322   MCV 91.6 01/19/2019 0322   NEUTROABS 5.3 01/19/2019 0322   LYMPHSABS 1.1 01/19/2019 0322   MONOABS 0.7 01/19/2019 0322   EOSABS 0.2 01/19/2019 0322   BASOSABS 0.0 01/19/2019 0322   Comprehensive Metabolic Panel:    Component Value Date/Time   NA 128 (L) 01/19/2019 0322   NA 139 07/31/2015 1246   K 4.8 01/19/2019 0322   CL 93 (L) 01/19/2019 0322   CO2 26 01/19/2019 0322   BUN 29 (H) 01/19/2019 0322   BUN 15 07/31/2015 1246   CREATININE 3.36 (H) 01/19/2019 0322   CREATININE 0.90 05/17/2016 1052   GLUCOSE 93 01/19/2019 0322   CALCIUM 8.1 (L) 01/19/2019 0322   AST 29 01/19/2019 0322   AST 32 02/19/2012 0935   ALT 23 01/19/2019 0322   ALT 36 02/19/2012 0935   ALKPHOS 155 (H) 01/19/2019 0322   ALKPHOS 96 02/19/2012 0935   BILITOT 0.3 01/19/2019 0322   BILITOT 0.5 07/31/2015 1246   BILITOT 0.4 02/19/2012 0935   PROT 5.8 (L) 01/19/2019 0322   PROT 6.6 07/31/2015 1246   PROT 6.9 02/19/2012 0935   ALBUMIN 2.1 (L) 01/19/2019 0322   ALBUMIN 4.3 07/31/2015 1246   ALBUMIN 3.6 02/19/2012 0935    RADIOGRAPHIC STUDIES: Ct Abdomen Pelvis Wo Contrast  Result Date: 01/16/2019 CLINICAL DATA:  No bowel movement 9 days.  Stage IV ovarian cancer. EXAM: CT ABDOMEN AND PELVIS WITHOUT CONTRAST TECHNIQUE: Multidetector CT imaging of the abdomen and pelvis was performed following the standard protocol without IV contrast. COMPARISON:  November 06, 2018 FINDINGS: Lower chest: Bilateral pleural effusions persist but are smaller in the interval. There is fluid adjacent to the distal esophagus extending from the abdomen. Hepatobiliary: The patient is status post cholecystectomy. The liver is a  otherwise unremarkable on this unenhanced study. Common bile duct dilatation appears similar, likely due to  previous cholecystectomy. Pancreas: The pancreas is poorly evaluated due to diffuse ascites and lack of contrast. No obvious pancreatic abnormality identified. Spleen: Normal in size without focal abnormality. Adrenals/Urinary Tract: Adrenal glands are normal. No renal stones or obstruction identified. No ureteral stones are noted. The bladder is unremarkable. Stomach/Bowel: The stomach and small bowel are normal. Severe fecal loading extends from the cecum into the mid sigmoid colon. An underlying cause is not seen. No pneumatosis noted. The appendix is not visualized but there is no secondary evidence of appendicitis. Vascular/Lymphatic: Mild atherosclerotic changes seen in the abdominal aorta. No definite adenopathy. Reproductive: Status post hysterectomy. No adnexal masses. Other: Severe ascites throughout the abdomen is significantly worsened in the interval. The peritoneal drain appears to be in good position, terminating in the left upper quadrant. It is difficult to evaluate for peritoneal or omental disease given the lack of contrast and the presence of ascites. However, there does appear to be omental/peritoneal nodularity on the left such as on axial image 37, more prominent the interval. Musculoskeletal: No acute or significant osseous findings. IMPRESSION: 1. Severe fecal loading from the cecum through the mid sigmoid colon of uncertain etiology. 2. The bilateral pleural effusions are smaller in the interval but persist. 3. The amount of ascites is significantly increased in the interval. 4. Evaluation for omental/peritoneal metastases is limited due to lack of contrast. However, there appears to be omental/peritoneal disease on the left which is more prominent the interval. 5. Mild atherosclerotic change in the abdominal aorta. Electronically Signed   By: Dorise Bullion III M.D   On: 01/16/2019  16:21   Dg Chest 2 View  Result Date: 01/05/2019 CLINICAL DATA:  Productive cough. Vomiting. Constipation. Metastatic ovarian cancer. EXAM: CHEST - 2 VIEW COMPARISON:  Chest x-ray dated 10/13/2018 and 10/10/2018 FINDINGS: Power port in place with the tip in the superior vena cava just below the level of the carina. Double lumen dialysis catheter in place with the tips at the cavoatrial junction. Heart size and pulmonary vascularity are normal. There are minimal bilateral pleural effusions. No discrete infiltrates. Minimal atelectasis at the left lung base. No acute bone abnormality. IMPRESSION: 1. Minimal bilateral pleural effusions. 2. Minimal atelectasis at the left lung base. 3. No discrete infiltrates. Electronically Signed   By: Lorriane Shire M.D.   On: 01/05/2019 13:01   Dg Abdomen 1 View  Result Date: 01/16/2019 CLINICAL DATA:  Constipation. EXAM: ABDOMEN - 1 VIEW COMPARISON:  January 05, 2019 FINDINGS: Severe fecal loading is seen throughout the length of the colon. A peritoneal drainage catheters again identified, terminating in the left upper quadrant. Pleural effusions are noted. No other acute abnormalities. IMPRESSION: Severe fecal loading in the colon. Small pleural effusions. Stable peritoneal drainage catheter. Electronically Signed   By: Dorise Bullion III M.D   On: 01/16/2019 14:59   Ct Abdomen Pelvis W Contrast  Result Date: 01/18/2019 CLINICAL DATA:  Concern for bowel obstruction, stage IV ovarian cancer EXAM: CT ABDOMEN AND PELVIS WITH CONTRAST TECHNIQUE: Multidetector CT imaging of the abdomen and pelvis was performed using the standard protocol following bolus administration of intravenous contrast. CONTRAST:  159m OMNIPAQUE IOHEXOL 300 MG/ML  SOLN COMPARISON:  CT abdomen pelvis 01/16/2019 FINDINGS: Lower chest: Small bilateral pleural effusions with mild adjacent atelectasis. Normal heart size. No pericardial effusion. Distal tip of a dialysis catheter terminates in the right  atrium. Hepatobiliary: No focal liver abnormality is seen. Patient is post cholecystectomy. Slight prominence of the biliary tree likely related to  reservoir effect. No calcified intraductal gallstones. Worsening intra and extrahepatic biliary ductal dilatation. No visible calcified intraductal gallstones. Pancreas: Pancreatic parenchyma appears compressed by the extensive, loculated ascites throughout the abdomen. No frank pancreatic ductal dilatation is evident. Spleen: Normal in size without focal abnormality. Adrenals/Urinary Tract: Adrenal glands are unremarkable. Kidneys are normal, without renal calculi, focal lesion, or hydronephrosis. Circumferential bladder wall thickening and perivesicular stranding. Stomach/Bowel: Distal esophagus and stomach are unremarkable. No frank small bowel thickening or dilatation. Extensive fecalization of the distal small bowel contents is noted. There is increasing colonic stool burden now with circumferential colonic thickening which worsens distally with a developing pneumatosis in the transverse and descending colon. Portions of the more distal colonic wall a hypoattenuating appearance. There is focal caliber change in the left lower quadrant along the left pelvic sidewall. Rectum is partially distended with high attenuation rectal contrast media. The use of both oral and rectal contrast confound evaluation of the high attenuation material in the colon, unclear if this reflects passage of rectal contrast versus distal transit of a bolus of ingested oral contrast given at least some small volume contrast media seen opacifying the cecum. Vascular/Lymphatic: Atherosclerotic plaque within the normal caliber aorta. Scattered reactive appearing adenopathy in the abdomen. Further evaluation is limited given the loculated peritoneal fluid. Reproductive: Patient is post hysterectomy. There is an irregular thickening along the left lateral aspect of the vaginal cuff extending to the  pelvic sidewall with loss of discernible fat plane with the adjacent sigmoid colon. Other: Loculated fluid collection throughout the abdomen with a drain in place. Associated peritoneal thickening is noted. Loculations extend into the deep pelvis adjacent the thickened portions of the colon. Scattered foci of gas are noted within these collections. There is abnormal omental nodularity in the left lower quadrant. Suspect additional nodules adjacent the bowel and along the pelvic sidewalls. Circumferential body wall edema. Musculoskeletal: Postsurgical changes from prior L4-5 spinal fusion. Multilevel degenerative changes are present in the imaged portions of the spine. No acute or suspicious osseous lesions. IMPRESSION: Findings concerning for a large bowel obstruction with a competent ileocecal valve, can may function as a closed loop obstruction of the colon. Increasing colonic stool burden with interval development of colonic wall thickening and edema with hypoattenuation and pneumatosis suspicious for vascular compromise, possibly secondary to pressure necrosis within the colon. Transition point is noted along the left pelvic sidewall where there is irregular soft tissue attenuation and loculated fluid and loss of discernible fat plane with the lateral aspect of the vaginal cuff likely related to patient's underlying ovarian cancer. Additional loculated fluid and omental nodularity throughout the abdomen with scattered foci of gas within the collection. Increasing intra and extrahepatic biliary ductal dilatation, greater than expected for the post cholecystectomy state. No intraductal gallstones. Correlate with liver serologies. Circumferential bladder wall thickening concerning for acute cystitis. Correlate with urinalysis. Moderate bilateral effusions. Aortic Atherosclerosis (ICD10-I70.0). These results were called by telephone at the time of interpretation on 01/18/2019 at 11:55 pm to provider NP Merlene Laughter,  who verbally acknowledged these results. Electronically Signed   By: Lovena Le M.D.   On: 01/18/2019 23:50   Ir Guided Niel Hummer W Catheter Placement  Result Date: 01/01/2019 CLINICAL DATA:  History of serous ovarian carcinoma and malignant ascites. Need for tunneled peritoneal drainage catheter for palliative care. EXAM: INSERTION OF TUNNELED PERITONEAL DRAINAGE CATHETER ANESTHESIA/SEDATION: 1.0 mg IV Versed; 75 mcg IV Fentanyl. Total Moderate Sedation Time 15 minutes. The patient's level of consciousness and physiologic  status were continuously monitored during the procedure by Radiology nursing. MEDICATIONS: 2 g IV Ancef. Antibiotic was administered in an appropriate time interval for the procedure. FLUOROSCOPY TIME:  Less than 6 seconds.  1.5 mGy. PROCEDURE: The procedure, risks, benefits, and alternatives were explained to the patient. Questions regarding the procedure were encouraged and answered. The patient understands and consents to the procedure. A time-out was performed prior to initiating the procedure. The right abdominal wall was prepped with chlorhexidine in a sterile fashion, and a sterile drape was applied covering the operative field. A sterile gown and sterile gloves were used for the procedure. Local anesthesia was provided with 1% Lidocaine. Ultrasound image documentation was performed. Fluoroscopy during the procedure and fluoroscopic spot radiograph confirms appropriate catheter position. After creating a small skin incision, a 19 gauge needle was advanced into the peritoneal cavity under ultrasound guidance. A guide wire was then advanced under fluoroscopy into the peritoneal cavity. Peritoneal access was dilated serially and a 16-French peel-away sheath placed. A 15.5 French tunneled peritoneal PleurX catheter was placed. This was tunneled from an incision 5 cm below the peritoneal access to the access site. The catheter was advanced through the peel-away sheath. The sheath was then  removed. Final catheter positioning was confirmed with a fluoroscopic spot image. The peritoneal access incision was closed with subcutaneous and subcuticular 4-0 Vicryl. Dermabond was applied to the incision. A Prolene retention suture was applied at the catheter exit site. Large volume paracentesis was performed through the new catheter utilizing drainage bottles. COMPLICATIONS: None. FINDINGS: The catheter was placed via the right abdominal wall. Catheter course is to the left with the tip in the left upper quadrant. Approximately 3.5 liters of ascites was able to be removed after catheter placement. IMPRESSION: Placement of tunneled peritoneal drainage catheter via right abdominal approach. 3.5 liters of ascites was removed today after catheter placement. Electronically Signed   By: Aletta Edouard M.D.   On: 01/01/2019 12:02   US Paracentesis  Result Date: 12/21/2018 INDICATION: Recurrent ascites EXAM: ULTRASOUND GUIDED PARACENTESIS MEDICATIONS: None. COMPLICATIONS: None immediate. PROCEDURE: Informed written consent was obtained from the patient after a discussion of the risks, benefits and alternatives to treatment. A timeout was performed prior to the initiation of the procedure. Initial ultrasound scanning demonstrates a large amount of ascites within the right lower abdominal quadrant. The right lower abdomen was prepped and draped in the usual sterile fashion. 1% lidocaine was used for local anesthesia. Following this, a 6 Fr Safe-T-Centesis catheter was introduced. An ultrasound image was saved for documentation purposes. The paracentesis was performed. The catheter was removed and a dressing was applied. The patient tolerated the procedure well without immediate post procedural complication. Patient received post-procedure intravenous albumin; see nursing notes for details. FINDINGS: A total of approximately 2.5 L of clear yellow fluid was removed. Samples were sent to the laboratory as requested by  the clinical team. IMPRESSION: Successful ultrasound-guided paracentesis yielding 2.5 liters of peritoneal fluid. Electronically Signed   By: Inez Catalina M.D.   On: 12/21/2018 15:24   Dg Abd 2 Views  Result Date: 01/05/2019 CLINICAL DATA:  Nausea and constipation. Productive cough. Metastatic ovarian cancer. Peritoneal drainage catheter. EXAM: ABDOMEN - 2 VIEW COMPARISON:  Radiograph dated 08/10/2018 FINDINGS: There are no dilated loops of large or small bowel. There is extensive stool throughout the colon including in the rectum but there is no discrete fecal impaction. No significant air in the bowel. Ascites. Peritoneal drainage catheter tip is in the  left upper quadrant. There are small bilateral pleural effusions. Surgical clips from previous cholecystectomy. No acute bone abnormality. IMPRESSION: 1. Extensive stool throughout the colon. No discrete fecal impaction. 2. No findings suggestive of bowel obstruction. 3. Small bilateral pleural effusions.  Ascites. Electronically Signed   By: Lorriane Shire M.D.   On: 01/05/2019 12:59    PERFORMANCE STATUS (ECOG) : 3 - Symptomatic, >50% confined to bed  Review of Systems Unless otherwise noted, a complete review of systems is negative.  Physical Exam General: NAD, frail appearing, thin Pulmonary: Unlabored Abdomen: Distended GU: no suprapubic tenderness Extremities: no edema, no joint deformities Skin: no rashes Neurological: Weakness but otherwise nonfocal  IMPRESSION: Contrast abdominal CT revealed colonic obstruction.  Surgical service recommended transfer to tertiary hospital.  Dr. Fransisca Connors has agreed to accept the patient at [facility] and patient is pending transfer today.  She is currently alert but has significant abdominal pain.  She received dose of hydromorphone for comfort.  I met with patient and sister.  They are hopeful that there will be a surgical solution offered but understanding that might not be a possibility.  We discussed  CODE STATUS.  Patient agreed with DNR/DNI.  I completed a DNR form and placed it in her discharge packet.  Case discussed with nursing, Dr. Grayland Ormond, and Dr. Reesa Chew.  PLAN: -Patient pending transfer to [facility] -Hydromorphone IV as needed for comfort -DNR/DNI   Time Total: 30 minutes  Visit consisted of counseling and education dealing with the complex and emotionally intense issues of symptom management and palliative care in the setting of serious and potentially life-threatening illness.Greater than 50%  of this time was spent counseling and coordinating care related to the above assessment and plan.  Signed by: Altha Harm, PhD, NP-C

## 2019-01-19 NOTE — Progress Notes (Signed)
[place]  [phone] [phone]  ID: Jessica Burgess OB: Sep 17, 1955  MR#: [id number]  ZSW#:[id number]  Patient Care Team: Steele Sizer, MD as PCP - General (Family Medicine) End, Harrell Gave, MD as PCP - Cardiology (Cardiology) Lloyd Huger, MD as Consulting Physician (Oncology) Mellody Drown, MD as Consulting Physician (Obstetrics and Gynecology) Cathi Roan, [facility] (Pharmacist) Clent Jacks, RN as Registered Nurse  CHIEF COMPLAINT: Progressive ovarian cancer, possible malignant large bowel obstruction, end-stage renal disease.  INTERVAL HISTORY: Patient evaluated in dialysis.  She is highly uncomfortable with significant amount of abdominal pain.  She offers no further complaints.  REVIEW OF SYSTEMS:   Review of Systems  Constitutional: Positive for malaise/fatigue. Negative for fever and weight loss.  Respiratory: Negative.  Negative for cough, hemoptysis and shortness of breath.   Cardiovascular: Negative.  Negative for chest pain and leg swelling.  Gastrointestinal: Positive for abdominal pain and constipation.  Genitourinary: Negative.   Musculoskeletal: Positive for back pain.  Skin: Negative.  Negative for rash.  Neurological: Positive for weakness. Negative for dizziness, focal weakness and headaches.  Psychiatric/Behavioral: The patient is nervous/anxious.     As per HPI. Otherwise, a complete review of systems is negative.  PAST MEDICAL HISTORY: Past Medical History:  Diagnosis Date   Allergic rhinitis, cause unspecified    Anxiety state, unspecified    Arthritis    Asthma    only when sick    Backache, unspecified    Bronchitis    hx of when get sick   Cancer [facility])    skin cancer , basal cell    Cancer ([place]) 11/2016   ovarian   Cervicalgia    CHF (congestive heart failure) (HCC)    Complication of anesthesia    Dermatophytosis of nail    Dysmetabolic syndrome X    Encounter  for long-term (current) use of other medications    Esophageal reflux    Hypertension    Insomnia, unspecified    Leukocytosis, unspecified    Migraine without aura, without mention of intractable migraine without mention of status migrainosus    Other and unspecified hyperlipidemia    Other malaise and fatigue    Overweight(278.02)    Personal history of chemotherapy now   ovarian   Renal insufficiency    Spinal stenosis in cervical region    Symptomatic menopausal or female climacteric states    Unspecified disorder of skin and subcutaneous tissue    Unspecified vitamin D deficiency     PAST SURGICAL HISTORY: Past Surgical History:  Procedure Laterality Date   ABDOMINAL HYSTERECTOMY     ANTERIOR CERVICAL DECOMP/DISCECTOMY FUSION N/A 06/07/2015   Procedure: Cervical three - four and Cervical six- seven anterior cervical decompression with fusion interbody prosthesis plating and bonegraft;  Surgeon: Newman Pies, MD;  Location: [place] [facility];  Service: Neurosurgery;  Laterality: N/A;  C34 and C67 anterior cervical decompression with fusion interbody prosthesis plating and bonegraft   BACK SURGERY     x2 Lower    DIALYSIS/PERMA CATHETER INSERTION N/A 08/17/2018   Procedure: DIALYSIS/PERMA CATHETER INSERTION;  Surgeon: Algernon Huxley, MD;  Location: [place] CV LAB;  Service: Cardiovascular;  Laterality: N/A;   EVACUATION OF CERVICAL HEMATOMA N/A 06/14/2015   Procedure: EVACUATION OF CERVICAL HEMATOMA;  Surgeon: Newman Pies, MD;  Location: [place] [facility];  Service: Neurosurgery;  Laterality: N/A;   IR GUIDED DRAIN W CATHETER PLACEMENT  01/01/2019   NECK SURGERY     x3  TONSILLECTOMY      FAMILY HISTORY: Family History  Problem Relation Age of Onset   Depression Mother    Migraines Mother    Dementia Father    Diabetes Father    Hyperlipidemia Father    Hyperlipidemia Brother    Hyperlipidemia Brother    Breast cancer Paternal Aunt         41s    ADVANCED DIRECTIVES (Y/N):  @ADVDIR @  HEALTH MAINTENANCE: Social History   Tobacco Use   Smoking status: Former Smoker    Packs/day: 1.50    Years: 20.00    Pack years: 30.00    Types: Cigarettes    Start date: 03/19/1979    Quit date: 08/25/1999    Years since quitting: 19.4   Smokeless tobacco: Never Used   Tobacco comment: smoking cessation materials not required  Substance Use Topics   Alcohol use: Not Currently    Alcohol/week: 0.0 standard drinks   Drug use: No     Colonoscopy:  PAP:  Bone density:  Lipid panel:  No Known Allergies  Current Facility-Administered Medications  Medication Dose Route Frequency Provider Last Rate Last Dose   0.9 %  sodium chloride infusion   Intravenous Continuous Lorella Nimrod, MD 100 mL/hr at 01/19/19 0830     acetaminophen (TYLENOL) tablet 650 mg  650 mg Oral Q6H PRN Lance Coon, MD       Or   acetaminophen (TYLENOL) suppository 650 mg  650 mg Rectal Q6H PRN Lance Coon, MD       amiodarone (PACERONE) tablet 200 mg  200 mg Oral Daily Lorella Nimrod, MD   Stopped at 01/19/19 0933   apixaban (ELIQUIS) tablet 5 mg  5 mg Oral BID Lance Coon, MD   Stopped at 01/19/19 0935   Chlorhexidine Gluconate Cloth 2 % PADS 6 each  6 each Topical Q0600 Murlean Iba, MD   6 each at 01/19/19 0936   hydrALAZINE (APRESOLINE) tablet 25 mg  25 mg Oral TID Lorella Nimrod, MD   25 mg at 01/18/19 2314   HYDROmorphone (DILAUDID) injection 1 mg  1 mg Intravenous Q2H PRN Radene Gunning, NP   1 mg at 01/19/19 1255   levothyroxine (SYNTHROID) tablet 25 mcg  25 mcg Oral QAC breakfast Lance Coon, MD   25 mcg at 01/19/19 0532   losartan (COZAAR) tablet 100 mg  100 mg Oral Daily Lorella Nimrod, MD   Stopped at 01/19/19 0936   ondansetron (ZOFRAN) tablet 4 mg  4 mg Oral Q6H PRN Lance Coon, MD       Or   ondansetron [facility]) injection 4 mg  4 mg Intravenous Q6H PRN Lance Coon, MD       pantoprazole (PROTONIX) EC tablet 40 mg  40 mg  Oral Daily Lance Coon, MD   Stopped at 01/19/19 0937   temazepam (RESTORIL) capsule 30 mg  30 mg Oral QHS PRN Hart Robinsons A, RPH   30 mg at 01/18/19 2314   Facility-Administered Medications Ordered in Other Encounters  Medication Dose Route Frequency Provider Last Rate Last Dose   0.9 %  sodium chloride infusion   Intravenous Once Lloyd Huger, MD       0.9 %  sodium chloride infusion   Intravenous Once Lloyd Huger, MD       heparin lock flush 100 unit/mL  500 Units Intravenous Once Lloyd Huger, MD       sodium chloride flush (NS) 0.9 % injection 10 mL  10  mL Intravenous PRN Lloyd Huger, MD   10 mL at 06/16/17 0854   sodium chloride flush (NS) 0.9 % injection 10 mL  10 mL Intravenous Once Lloyd Huger, MD        OBJECTIVE: Vitals:   01/19/19 1224 01/19/19 1316  BP:  132/82  Pulse: 99 88  Resp: 18 18  Temp: 98.7 F (37.1 C) 98.7 F (37.1 C)  SpO2:  95%     Body mass index is 27.74 kg/m.    ECOG FS:3 - Symptomatic, >50% confined to bed  General: Ill-appearing, moderate distress secondary to pain. Eyes: Pink conjunctiva, anicteric sclera. HEENT: Normocephalic, moist mucous membranes, clear oropharnyx. Lungs: Clear to auscultation bilaterally. Heart: Regular rate and rhythm. No rubs, murmurs, or gallops. Abdomen: Diffusely tender. Musculoskeletal: No edema, cyanosis, or clubbing. Neuro: Alert, answering all questions appropriately. Cranial nerves grossly intact. Skin: No rashes or petechiae noted. Psych: Normal affect.  LAB RESULTS:  Lab Results  Component Value Date   NA 128 (L) 01/19/2019   K 4.8 01/19/2019   CL 93 (L) 01/19/2019   CO2 26 01/19/2019   GLUCOSE 93 01/19/2019   BUN 29 (H) 01/19/2019   CREATININE 3.36 (H) 01/19/2019   CALCIUM 8.1 (L) 01/19/2019   PROT 5.8 (L) 01/19/2019   ALBUMIN 2.1 (L) 01/19/2019   AST 29 01/19/2019   ALT 23 01/19/2019   ALKPHOS 155 (H) 01/19/2019   BILITOT 0.3 01/19/2019   GFRNONAA 14  (L) 01/19/2019   GFRAA 16 (L) 01/19/2019    Lab Results  Component Value Date   WBC 7.4 01/19/2019   NEUTROABS 5.3 01/19/2019   HGB 11.4 (L) 01/19/2019   HCT 36.2 01/19/2019   MCV 91.6 01/19/2019   PLT 318 01/19/2019     STUDIES: Ct Abdomen Pelvis Wo Contrast  Result Date: 01/16/2019 CLINICAL DATA:  No bowel movement 9 days.  Stage IV ovarian cancer. EXAM: CT ABDOMEN AND PELVIS WITHOUT CONTRAST TECHNIQUE: Multidetector CT imaging of the abdomen and pelvis was performed following the standard protocol without IV contrast. COMPARISON:  November 06, 2018 FINDINGS: Lower chest: Bilateral pleural effusions persist but are smaller in the interval. There is fluid adjacent to the distal esophagus extending from the abdomen. Hepatobiliary: The patient is status post cholecystectomy. The liver is a otherwise unremarkable on this unenhanced study. Common bile duct dilatation appears similar, likely due to previous cholecystectomy. Pancreas: The pancreas is poorly evaluated due to diffuse ascites and Burgess of contrast. No obvious pancreatic abnormality identified. Spleen: Normal in size without focal abnormality. Adrenals/Urinary Tract: Adrenal glands are normal. No renal stones or obstruction identified. No ureteral stones are noted. The bladder is unremarkable. Stomach/Bowel: The stomach and small bowel are normal. Severe fecal loading extends from the cecum into the mid sigmoid colon. An underlying cause is not seen. No pneumatosis noted. The appendix is not visualized but there is no secondary evidence of appendicitis. Vascular/Lymphatic: Mild atherosclerotic changes seen in the abdominal aorta. No definite adenopathy. Reproductive: Status post hysterectomy. No adnexal masses. Other: Severe ascites throughout the abdomen is significantly worsened in the interval. The peritoneal drain appears to be in good position, terminating in the left upper quadrant. It is difficult to evaluate for peritoneal or omental  disease given the Burgess of contrast and the presence of ascites. However, there does appear to be omental/peritoneal nodularity on the left such as on axial image 37, more prominent the interval. Musculoskeletal: No acute or significant osseous findings. IMPRESSION: 1. Severe fecal loading from  the cecum through the mid sigmoid colon of uncertain etiology. 2. The bilateral pleural effusions are smaller in the interval but persist. 3. The amount of ascites is significantly increased in the interval. 4. Evaluation for omental/peritoneal metastases is limited due to Burgess of contrast. However, there appears to be omental/peritoneal disease on the left which is more prominent the interval. 5. Mild atherosclerotic change in the abdominal aorta. Electronically Signed   By: Dorise Bullion III M.D   On: 01/16/2019 16:21   Dg Chest 2 View  Result Date: 01/05/2019 CLINICAL DATA:  Productive cough. Vomiting. Constipation. Metastatic ovarian cancer. EXAM: CHEST - 2 VIEW COMPARISON:  Chest x-ray dated 10/13/2018 and 10/10/2018 FINDINGS: Power port in place with the tip in the superior vena cava just below the level of the carina. Double lumen dialysis catheter in place with the tips at the cavoatrial junction. Heart size and pulmonary vascularity are normal. There are minimal bilateral pleural effusions. No discrete infiltrates. Minimal atelectasis at the left lung base. No acute bone abnormality. IMPRESSION: 1. Minimal bilateral pleural effusions. 2. Minimal atelectasis at the left lung base. 3. No discrete infiltrates. Electronically Signed   By: Lorriane Shire M.D.   On: 01/05/2019 13:01   Dg Abdomen 1 View  Result Date: 01/16/2019 CLINICAL DATA:  Constipation. EXAM: ABDOMEN - 1 VIEW COMPARISON:  January 05, 2019 FINDINGS: Severe fecal loading is seen throughout the length of the colon. A peritoneal drainage catheters again identified, terminating in the left upper quadrant. Pleural effusions are noted. No other  acute abnormalities. IMPRESSION: Severe fecal loading in the colon. Small pleural effusions. Stable peritoneal drainage catheter. Electronically Signed   By: Dorise Bullion III M.D   On: 01/16/2019 14:59   Ct Abdomen Pelvis W Contrast  Result Date: 01/18/2019 CLINICAL DATA:  Concern for bowel obstruction, stage IV ovarian cancer EXAM: CT ABDOMEN AND PELVIS WITH CONTRAST TECHNIQUE: Multidetector CT imaging of the abdomen and pelvis was performed using the standard protocol following bolus administration of intravenous contrast. CONTRAST:  168mL OMNIPAQUE IOHEXOL 300 MG/ML  SOLN COMPARISON:  CT abdomen pelvis 01/16/2019 FINDINGS: Lower chest: Small bilateral pleural effusions with mild adjacent atelectasis. Normal heart size. No pericardial effusion. Distal tip of a dialysis catheter terminates in the right atrium. Hepatobiliary: No focal liver abnormality is seen. Patient is post cholecystectomy. Slight prominence of the biliary tree likely related to reservoir effect. No calcified intraductal gallstones. Worsening intra and extrahepatic biliary ductal dilatation. No visible calcified intraductal gallstones. Pancreas: Pancreatic parenchyma appears compressed by the extensive, loculated ascites throughout the abdomen. No frank pancreatic ductal dilatation is evident. Spleen: Normal in size without focal abnormality. Adrenals/Urinary Tract: Adrenal glands are unremarkable. Kidneys are normal, without renal calculi, focal lesion, or hydronephrosis. Circumferential bladder wall thickening and perivesicular stranding. Stomach/Bowel: Distal esophagus and stomach are unremarkable. No frank small bowel thickening or dilatation. Extensive fecalization of the distal small bowel contents is noted. There is increasing colonic stool burden now with circumferential colonic thickening which worsens distally with a developing pneumatosis in the transverse and descending colon. Portions of the more distal colonic wall a  hypoattenuating appearance. There is focal caliber change in the left lower quadrant along the left pelvic sidewall. Rectum is partially distended with high attenuation rectal contrast media. The use of both oral and rectal contrast confound evaluation of the high attenuation material in the colon, unclear if this reflects passage of rectal contrast versus distal transit of a bolus of ingested oral contrast given at least some  small volume contrast media seen opacifying the cecum. Vascular/Lymphatic: Atherosclerotic plaque within the normal caliber aorta. Scattered reactive appearing adenopathy in the abdomen. Further evaluation is limited given the loculated peritoneal fluid. Reproductive: Patient is post hysterectomy. There is an irregular thickening along the left lateral aspect of the vaginal cuff extending to the pelvic sidewall with loss of discernible fat plane with the adjacent sigmoid colon. Other: Loculated fluid collection throughout the abdomen with a drain in place. Associated peritoneal thickening is noted. Loculations extend into the deep pelvis adjacent the thickened portions of the colon. Scattered foci of gas are noted within these collections. There is abnormal omental nodularity in the left lower quadrant. Suspect additional nodules adjacent the bowel and along the pelvic sidewalls. Circumferential body wall edema. Musculoskeletal: Postsurgical changes from prior L4-5 spinal fusion. Multilevel degenerative changes are present in the imaged portions of the spine. No acute or suspicious osseous lesions. IMPRESSION: Findings concerning for a large bowel obstruction with a competent ileocecal valve, can may function as a closed loop obstruction of the colon. Increasing colonic stool burden with interval development of colonic wall thickening and edema with hypoattenuation and pneumatosis suspicious for vascular compromise, possibly secondary to pressure necrosis within the colon. Transition point is  noted along the left pelvic sidewall where there is irregular soft tissue attenuation and loculated fluid and loss of discernible fat plane with the lateral aspect of the vaginal cuff likely related to patient's underlying ovarian cancer. Additional loculated fluid and omental nodularity throughout the abdomen with scattered foci of gas within the collection. Increasing intra and extrahepatic biliary ductal dilatation, greater than expected for the post cholecystectomy state. No intraductal gallstones. Correlate with liver serologies. Circumferential bladder wall thickening concerning for acute cystitis. Correlate with urinalysis. Moderate bilateral effusions. Aortic Atherosclerosis (ICD10-I70.0). These results were called by telephone at the time of interpretation on 01/18/2019 at 11:55 pm to provider NP Merlene Laughter, who verbally acknowledged these results. Electronically Signed   By: Lovena Le M.D.   On: 01/18/2019 23:50   Ir Guided Niel Hummer W Catheter Placement  Result Date: 01/01/2019 CLINICAL DATA:  History of serous ovarian carcinoma and malignant ascites. Need for tunneled peritoneal drainage catheter for palliative care. EXAM: INSERTION OF TUNNELED PERITONEAL DRAINAGE CATHETER ANESTHESIA/SEDATION: 1.0 mg IV Versed; 75 mcg IV Fentanyl. Total Moderate Sedation Time 15 minutes. The patient's level of consciousness and physiologic status were continuously monitored during the procedure by Radiology nursing. MEDICATIONS: 2 g IV Ancef. Antibiotic was administered in an appropriate time interval for the procedure. FLUOROSCOPY TIME:  Less than 6 seconds.  1.5 mGy. PROCEDURE: The procedure, risks, benefits, and alternatives were explained to the patient. Questions regarding the procedure were encouraged and answered. The patient understands and consents to the procedure. A time-out was performed prior to initiating the procedure. The right abdominal wall was prepped with chlorhexidine in a sterile fashion, and  a sterile drape was applied covering the operative field. A sterile gown and sterile gloves were used for the procedure. Local anesthesia was provided with 1% Lidocaine. Ultrasound image documentation was performed. Fluoroscopy during the procedure and fluoroscopic spot radiograph confirms appropriate catheter position. After creating a small skin incision, a 19 gauge needle was advanced into the peritoneal cavity under ultrasound guidance. A guide wire was then advanced under fluoroscopy into the peritoneal cavity. Peritoneal access was dilated serially and a 16-French peel-away sheath placed. A 15.5 French tunneled peritoneal PleurX catheter was placed. This was tunneled from an incision 5 cm below the  peritoneal access to the access site. The catheter was advanced through the peel-away sheath. The sheath was then removed. Final catheter positioning was confirmed with a fluoroscopic spot image. The peritoneal access incision was closed with subcutaneous and subcuticular 4-0 Vicryl. Dermabond was applied to the incision. A Prolene retention suture was applied at the catheter exit site. Large volume paracentesis was performed through the new catheter utilizing drainage bottles. COMPLICATIONS: None. FINDINGS: The catheter was placed via the right abdominal wall. Catheter course is to the left with the tip in the left upper quadrant. Approximately 3.5 liters of ascites was able to be removed after catheter placement. IMPRESSION: Placement of tunneled peritoneal drainage catheter via right abdominal approach. 3.5 liters of ascites was removed today after catheter placement. Electronically Signed   By: Aletta Edouard M.D.   On: 01/01/2019 12:02   US Paracentesis  Result Date: 12/21/2018 INDICATION: Recurrent ascites EXAM: ULTRASOUND GUIDED PARACENTESIS MEDICATIONS: None. COMPLICATIONS: None immediate. PROCEDURE: Informed written consent was obtained from the patient after a discussion of the risks, benefits and  alternatives to treatment. A timeout was performed prior to the initiation of the procedure. Initial ultrasound scanning demonstrates a large amount of ascites within the right lower abdominal quadrant. The right lower abdomen was prepped and draped in the usual sterile fashion. 1% lidocaine was used for local anesthesia. Following this, a 6 Fr Safe-T-Centesis catheter was introduced. An ultrasound image was saved for documentation purposes. The paracentesis was performed. The catheter was removed and a dressing was applied. The patient tolerated the procedure well without immediate post procedural complication. Patient received post-procedure intravenous albumin; see nursing notes for details. FINDINGS: A total of approximately 2.5 L of clear yellow fluid was removed. Samples were sent to the laboratory as requested by the clinical team. IMPRESSION: Successful ultrasound-guided paracentesis yielding 2.5 liters of peritoneal fluid. Electronically Signed   By: Inez Catalina M.D.   On: 12/21/2018 15:24   Dg Abd 2 Views  Result Date: 01/05/2019 CLINICAL DATA:  Nausea and constipation. Productive cough. Metastatic ovarian cancer. Peritoneal drainage catheter. EXAM: ABDOMEN - 2 VIEW COMPARISON:  Radiograph dated 08/10/2018 FINDINGS: There are no dilated loops of large or small bowel. There is extensive stool throughout the colon including in the rectum but there is no discrete fecal impaction. No significant air in the bowel. Ascites. Peritoneal drainage catheter tip is in the left upper quadrant. There are small bilateral pleural effusions. Surgical clips from previous cholecystectomy. No acute bone abnormality. IMPRESSION: 1. Extensive stool throughout the colon. No discrete fecal impaction. 2. No findings suggestive of bowel obstruction. 3. Small bilateral pleural effusions.  Ascites. Electronically Signed   By: Lorriane Shire M.D.   On: 01/05/2019 12:59    ASSESSMENT: Progressive ovarian cancer, possible  malignant large bowel obstruction, end-stage renal disease.  PLAN:    1.  Progressive ovarian cancer: Patient last evaluated in the [facility] on November 11, 2018 at which time she elected to discontinue all chemotherapy, but was not yet ready to enroll in hospice.  She last received chemotherapy with single agent gemcitabine on October 06, 2018.  CT scan results from January 18, 2019 reviewed independently and reported as above with a large bowel obstruction that is likely malignant in nature.  Plan is to transfer to [facility] given the high risk nature of any intervention.  Have reached out to patient's primary Gyn-Onc surgeon, Dr. Cecille Po, but have not heard back from him.  Agree with transfer to [facility].  2.  Constipation: Likely secondary to malignant large bowel obstruction.  Appreciate GI and surgical input. 3.  End-stage renal disease: Continue dialysis on Tuesday, Thursdays, and Saturdays. 4.  Anemia: Mild, monitor.  Will follow.  Lloyd Huger, MD   01/19/2019 1:20 PM

## 2019-01-19 NOTE — Progress Notes (Signed)
This note also relates to the following rows which could not be included: Pulse Rate - Cannot attach notes to unvalidated device data Resp - Cannot attach notes to unvalidated device data  Hd discontinued

## 2019-01-19 NOTE — Progress Notes (Signed)
error 

## 2019-01-19 NOTE — Progress Notes (Signed)
Established hemodialysis patient known at [facility] TTS 6:00, drives self to treatments. Please contact me with any dialysis placement concerns.  Elvera Bicker Dialysis Coordinator [phone]

## 2019-01-19 NOTE — Discharge Summary (Signed)
Physician Discharge Summary  SHERINA STAMMER AXK:[id number] DOB: 1956/03/12 DOA: 01/16/2019  PCP: Steele Sizer, MD  Admit date: 01/16/2019 Discharge date: 01/19/2019  Admitted From: Home Disposition: Transfer to [facility] Recommendations for Outpatient Follow-up:  1. Follow up with PCP in 1-2 weeks 2. Please obtain BMP/CBC in one week 3. Please follow up on the following pending results:  Home Health: No Equipment/Devices: None Discharge Condition: Stable CODE STATUS: Full Diet recommendation: N.p.o.  Brief/Interim Summary: [name], 63 year old lady with past medical history significant for progressive ovarian cancer, hypertension, ESRD on dialysis(Tuesday, Thursday and Saturday)and paroxysmal atrial fibrillation came to [facility] ED with complaints of worsening constipation and abdominal pain for 9 days.She failed lactulose and multiple enemas.  She was given bowel prep for more than 24 hours by GI with no results.  Surgery was consulted at that time.  They are advised to repeat CT abdomen with contrast which was declined initially by radiology due to her renal status.  CT abdomen with contrast was done yesterday which shows findings concerning for a large bowel obstruction with a competent ileocecal valve, can may function as a closed loop obstruction of the colon. Increasing colonic stool burden with interval development of colonic wall thickening and edema with hypoattenuation and pneumatosis suspicious for vascular compromise, possibly secondary to pressure necrosis within the colon. Transition point is noted along the left pelvic sidewall where there is irregular soft tissue attenuation and loculated fluid and loss of discernible fat plane with the lateral aspect of the vaginal cuff likely related to patient's underlying ovarian cancer.  Given the extensive nature of her disease and likely malignant nature of her large bowel obstruction there is unfortunately no good solution  that does not bear excessive risk. The patient was discussed at length among the surgeons of our practice group. At this point, we all recommend transfer to tertiary center (such as [facility], where she has received prior treatment) for evaluation and potential management as they have more resources available to manage any potential surgical intervention.  We contacted [facility] transfer facility and appreciate their response for this prompt transfer.  Discharge Diagnoses:  Principal Problem:   Constipation Active Problems:   Gastro-esophageal reflux disease without esophagitis   Ovarian cancer, unspecified laterality ([place])   Hypothyroidism due to medication   HTN (hypertension)   ESRD on dialysis (HCC)   (HFpEF) heart failure with preserved ejection fraction (HCC)   Paroxysmal atrial fibrillation (HCC)   Ascites   Peritoneal disease   Palliative care encounter  Discharge Instructions  Discharge Instructions    Diet - low sodium heart healthy   Complete by: As directed    Increase activity slowly   Complete by: As directed      Allergies as of 01/19/2019   No Known Allergies     Medication List    STOP taking these medications   lactulose 10 GM/15ML solution Commonly known as: CHRONULAC   metoCLOPramide 5 MG tablet Commonly known as: REGLAN   polyethylene glycol powder 17 GM/SCOOP powder Commonly known as: GLYCOLAX/MIRALAX   senna 8.6 MG Tabs tablet Commonly known as: SENOKOT     TAKE these medications   albuterol 108 (90 Base) MCG/ACT inhaler Commonly known as: VENTOLIN HFA Inhale 2 puffs into the lungs every 6 (six) hours as needed for wheezing or shortness of breath.   albuterol (2.5 MG/3ML) 0.083% nebulizer solution Commonly known as: PROVENTIL Take 3 mLs (2.5 mg total) by nebulization every 6 (six) hours as needed for wheezing or  shortness of breath.   ALPRAZolam 0.25 MG tablet Commonly known as: XANAX TAKE 1 TABLET(0.25 MG) BY MOUTH AT BEDTIME AS NEEDED FOR  ANXIETY What changed: See the new instructions.   amiodarone 200 MG tablet Commonly known as: PACERONE Take 1 tablet (200 mg total) by mouth daily.   apixaban 5 MG Tabs tablet Commonly known as: ELIQUIS Take 1 tablet (5 mg total) by mouth 2 (two) times daily.   cetirizine 10 MG tablet Commonly known as: ZYRTEC Take 10 mg by mouth daily.   cyclobenzaprine 10 MG tablet Commonly known as: FLEXERIL Take 1 tablet (10 mg total) by mouth every 8 (eight) hours as needed.   Dulera 200-5 MCG/ACT Aero Generic drug: mometasone-formoterol Inhale 2 puffs into the lungs 2 (two) times daily as needed for wheezing or shortness of breath.   DULoxetine 60 MG capsule Commonly known as: CYMBALTA Take 1 capsule (60 mg total) by mouth daily.   fentaNYL 12 MCG/HR Commonly known as: [place] 1 patch onto the skin every 3 (three) days.   Fish Oil 1000 MG Caps Take 1,000 capsules by mouth 2 (two) times daily.   fluticasone 50 MCG/ACT nasal spray Commonly known as: FLONASE Place 2 sprays into both nostrils as needed. What changed:   when to take this  reasons to take this   hydrALAZINE 25 MG tablet Commonly known as: APRESOLINE Take 1 tablet (25 mg total) by mouth 3 (three) times daily. For BP if above 150/90   hydrOXYzine 10 MG tablet Commonly known as: ATARAX/VISTARIL Take 10 mg by mouth 3 (three) times daily as needed for itching.   ipratropium 17 MCG/ACT inhaler Commonly known as: ATROVENT HFA Inhale 2 puffs into the lungs every 6 (six) hours as needed for wheezing.   Iron Supplement 325 (65 FE) MG tablet Generic drug: ferrous sulfate Take 325 mg by mouth daily.   isosorbide mononitrate 30 MG 24 hr tablet Commonly known as: IMDUR Take 1 tablet (30 mg total) by mouth daily.   levothyroxine 25 MCG tablet Commonly known as: Synthroid Take 1 tablet (25 mcg total) by mouth daily before breakfast.   lidocaine 2 % solution Commonly known as: XYLOCAINE Use as directed 15  mLs in the mouth or throat as needed for mouth pain.   losartan 100 MG tablet Commonly known as: COZAAR Take 1 tablet (100 mg total) by mouth daily.   Lysine 1000 MG Tabs Take 1,000 mg by mouth daily.   Magnesium Oxide 400 (240 Mg) MG Tabs Take 400 mg by mouth 2 (two) times daily.   metoprolol succinate 50 MG 24 hr tablet Commonly known as: TOPROL-XL Take 1 tablet (50 mg total) by mouth daily. Take with or immediately following a meal.   MULTIVITAMIN PO Take 1 tablet by mouth daily.   ondansetron 8 MG disintegrating tablet Commonly known as: ZOFRAN-ODT Take 1 tablet (8 mg total) by mouth every 8 (eight) hours as needed for nausea or vomiting.   oxyCODONE-acetaminophen 10-325 MG tablet Commonly known as: Percocet Take 1 tablet by mouth every 4 (four) hours as needed for pain.   pantoprazole 40 MG tablet Commonly known as: PROTONIX Take 1 tablet (40 mg total) by mouth daily.   SUMAtriptan 100 MG tablet Commonly known as: IMITREX Take 1 tablet (100 mg total) by mouth daily as needed for migraine. May repeat in 2 hours if headache persists or recurs.   temazepam 30 MG capsule Commonly known as: RESTORIL Take 1 capsule (30 mg total) by mouth  at bedtime as needed for sleep.   torsemide 20 MG tablet Commonly known as: Demadex Take 2 tablets (40 mg total) by mouth 2 (two) times daily.   vitamin B-12 1000 MCG tablet Commonly known as: CYANOCOBALAMIN Take 1,000 mcg by mouth daily.   vitamin C 500 MG tablet Commonly known as: ASCORBIC ACID Take 500 mg by mouth daily.   Vitamin D-1000 Max St 25 MCG (1000 UT) tablet Generic drug: Cholecalciferol Take 1,000 Units by mouth 2 (two) times daily.       No Known Allergies  Consultations: Gastroenterology. General surgery Palliative care  Procedures/Studies: Ct Abdomen Pelvis Wo Contrast  Result Date: 01/16/2019 CLINICAL DATA:  No bowel movement 9 days.  Stage IV ovarian cancer. EXAM: CT ABDOMEN AND PELVIS WITHOUT  CONTRAST TECHNIQUE: Multidetector CT imaging of the abdomen and pelvis was performed following the standard protocol without IV contrast. COMPARISON:  November 06, 2018 FINDINGS: Lower chest: Bilateral pleural effusions persist but are smaller in the interval. There is fluid adjacent to the distal esophagus extending from the abdomen. Hepatobiliary: The patient is status post cholecystectomy. The liver is a otherwise unremarkable on this unenhanced study. Common bile duct dilatation appears similar, likely due to previous cholecystectomy. Pancreas: The pancreas is poorly evaluated due to diffuse ascites and lack of contrast. No obvious pancreatic abnormality identified. Spleen: Normal in size without focal abnormality. Adrenals/Urinary Tract: Adrenal glands are normal. No renal stones or obstruction identified. No ureteral stones are noted. The bladder is unremarkable. Stomach/Bowel: The stomach and small bowel are normal. Severe fecal loading extends from the cecum into the mid sigmoid colon. An underlying cause is not seen. No pneumatosis noted. The appendix is not visualized but there is no secondary evidence of appendicitis. Vascular/Lymphatic: Mild atherosclerotic changes seen in the abdominal aorta. No definite adenopathy. Reproductive: Status post hysterectomy. No adnexal masses. Other: Severe ascites throughout the abdomen is significantly worsened in the interval. The peritoneal drain appears to be in good position, terminating in the left upper quadrant. It is difficult to evaluate for peritoneal or omental disease given the lack of contrast and the presence of ascites. However, there does appear to be omental/peritoneal nodularity on the left such as on axial image 37, more prominent the interval. Musculoskeletal: No acute or significant osseous findings. IMPRESSION: 1. Severe fecal loading from the cecum through the mid sigmoid colon of uncertain etiology. 2. The bilateral pleural effusions are smaller in  the interval but persist. 3. The amount of ascites is significantly increased in the interval. 4. Evaluation for omental/peritoneal metastases is limited due to lack of contrast. However, there appears to be omental/peritoneal disease on the left which is more prominent the interval. 5. Mild atherosclerotic change in the abdominal aorta. Electronically Signed   By: Dorise Bullion III M.D   On: 01/16/2019 16:21   Dg Chest 2 View  Result Date: 01/05/2019 CLINICAL DATA:  Productive cough. Vomiting. Constipation. Metastatic ovarian cancer. EXAM: CHEST - 2 VIEW COMPARISON:  Chest x-ray dated 10/13/2018 and 10/10/2018 FINDINGS: Power port in place with the tip in the superior vena cava just below the level of the carina. Double lumen dialysis catheter in place with the tips at the cavoatrial junction. Heart size and pulmonary vascularity are normal. There are minimal bilateral pleural effusions. No discrete infiltrates. Minimal atelectasis at the left lung base. No acute bone abnormality. IMPRESSION: 1. Minimal bilateral pleural effusions. 2. Minimal atelectasis at the left lung base. 3. No discrete infiltrates. Electronically Signed   By:  Lorriane Shire M.D.   On: 01/05/2019 13:01   Dg Abdomen 1 View  Result Date: 01/16/2019 CLINICAL DATA:  Constipation. EXAM: ABDOMEN - 1 VIEW COMPARISON:  January 05, 2019 FINDINGS: Severe fecal loading is seen throughout the length of the colon. A peritoneal drainage catheters again identified, terminating in the left upper quadrant. Pleural effusions are noted. No other acute abnormalities. IMPRESSION: Severe fecal loading in the colon. Small pleural effusions. Stable peritoneal drainage catheter. Electronically Signed   By: Dorise Bullion III M.D   On: 01/16/2019 14:59   Ct Abdomen Pelvis W Contrast  Result Date: 01/18/2019 CLINICAL DATA:  Concern for bowel obstruction, stage IV ovarian cancer EXAM: CT ABDOMEN AND PELVIS WITH CONTRAST TECHNIQUE: Multidetector CT  imaging of the abdomen and pelvis was performed using the standard protocol following bolus administration of intravenous contrast. CONTRAST:  186mL OMNIPAQUE IOHEXOL 300 MG/ML  SOLN COMPARISON:  CT abdomen pelvis 01/16/2019 FINDINGS: Lower chest: Small bilateral pleural effusions with mild adjacent atelectasis. Normal heart size. No pericardial effusion. Distal tip of a dialysis catheter terminates in the right atrium. Hepatobiliary: No focal liver abnormality is seen. Patient is post cholecystectomy. Slight prominence of the biliary tree likely related to reservoir effect. No calcified intraductal gallstones. Worsening intra and extrahepatic biliary ductal dilatation. No visible calcified intraductal gallstones. Pancreas: Pancreatic parenchyma appears compressed by the extensive, loculated ascites throughout the abdomen. No frank pancreatic ductal dilatation is evident. Spleen: Normal in size without focal abnormality. Adrenals/Urinary Tract: Adrenal glands are unremarkable. Kidneys are normal, without renal calculi, focal lesion, or hydronephrosis. Circumferential bladder wall thickening and perivesicular stranding. Stomach/Bowel: Distal esophagus and stomach are unremarkable. No frank small bowel thickening or dilatation. Extensive fecalization of the distal small bowel contents is noted. There is increasing colonic stool burden now with circumferential colonic thickening which worsens distally with a developing pneumatosis in the transverse and descending colon. Portions of the more distal colonic wall a hypoattenuating appearance. There is focal caliber change in the left lower quadrant along the left pelvic sidewall. Rectum is partially distended with high attenuation rectal contrast media. The use of both oral and rectal contrast confound evaluation of the high attenuation material in the colon, unclear if this reflects passage of rectal contrast versus distal transit of a bolus of ingested oral contrast  given at least some small volume contrast media seen opacifying the cecum. Vascular/Lymphatic: Atherosclerotic plaque within the normal caliber aorta. Scattered reactive appearing adenopathy in the abdomen. Further evaluation is limited given the loculated peritoneal fluid. Reproductive: Patient is post hysterectomy. There is an irregular thickening along the left lateral aspect of the vaginal cuff extending to the pelvic sidewall with loss of discernible fat plane with the adjacent sigmoid colon. Other: Loculated fluid collection throughout the abdomen with a drain in place. Associated peritoneal thickening is noted. Loculations extend into the deep pelvis adjacent the thickened portions of the colon. Scattered foci of gas are noted within these collections. There is abnormal omental nodularity in the left lower quadrant. Suspect additional nodules adjacent the bowel and along the pelvic sidewalls. Circumferential body wall edema. Musculoskeletal: Postsurgical changes from prior L4-5 spinal fusion. Multilevel degenerative changes are present in the imaged portions of the spine. No acute or suspicious osseous lesions. IMPRESSION: Findings concerning for a large bowel obstruction with a competent ileocecal valve, can may function as a closed loop obstruction of the colon. Increasing colonic stool burden with interval development of colonic wall thickening and edema with hypoattenuation and pneumatosis suspicious for  vascular compromise, possibly secondary to pressure necrosis within the colon. Transition point is noted along the left pelvic sidewall where there is irregular soft tissue attenuation and loculated fluid and loss of discernible fat plane with the lateral aspect of the vaginal cuff likely related to patient's underlying ovarian cancer. Additional loculated fluid and omental nodularity throughout the abdomen with scattered foci of gas within the collection. Increasing intra and extrahepatic biliary ductal  dilatation, greater than expected for the post cholecystectomy state. No intraductal gallstones. Correlate with liver serologies. Circumferential bladder wall thickening concerning for acute cystitis. Correlate with urinalysis. Moderate bilateral effusions. Aortic Atherosclerosis (ICD10-I70.0). These results were called by telephone at the time of interpretation on 01/18/2019 at 11:55 pm to provider NP Merlene Laughter, who verbally acknowledged these results. Electronically Signed   By: Lovena Le M.D.   On: 01/18/2019 23:50   Ir Guided Niel Hummer W Catheter Placement  Result Date: 01/01/2019 CLINICAL DATA:  History of serous ovarian carcinoma and malignant ascites. Need for tunneled peritoneal drainage catheter for palliative care. EXAM: INSERTION OF TUNNELED PERITONEAL DRAINAGE CATHETER ANESTHESIA/SEDATION: 1.0 mg IV Versed; 75 mcg IV Fentanyl. Total Moderate Sedation Time 15 minutes. The patient's level of consciousness and physiologic status were continuously monitored during the procedure by Radiology nursing. MEDICATIONS: 2 g IV Ancef. Antibiotic was administered in an appropriate time interval for the procedure. FLUOROSCOPY TIME:  Less than 6 seconds.  1.5 mGy. PROCEDURE: The procedure, risks, benefits, and alternatives were explained to the patient. Questions regarding the procedure were encouraged and answered. The patient understands and consents to the procedure. A time-out was performed prior to initiating the procedure. The right abdominal wall was prepped with chlorhexidine in a sterile fashion, and a sterile drape was applied covering the operative field. A sterile gown and sterile gloves were used for the procedure. Local anesthesia was provided with 1% Lidocaine. Ultrasound image documentation was performed. Fluoroscopy during the procedure and fluoroscopic spot radiograph confirms appropriate catheter position. After creating a small skin incision, a 19 gauge needle was advanced into the peritoneal  cavity under ultrasound guidance. A guide wire was then advanced under fluoroscopy into the peritoneal cavity. Peritoneal access was dilated serially and a 16-French peel-away sheath placed. A 15.5 French tunneled peritoneal PleurX catheter was placed. This was tunneled from an incision 5 cm below the peritoneal access to the access site. The catheter was advanced through the peel-away sheath. The sheath was then removed. Final catheter positioning was confirmed with a fluoroscopic spot image. The peritoneal access incision was closed with subcutaneous and subcuticular 4-0 Vicryl. Dermabond was applied to the incision. A Prolene retention suture was applied at the catheter exit site. Large volume paracentesis was performed through the new catheter utilizing drainage bottles. COMPLICATIONS: None. FINDINGS: The catheter was placed via the right abdominal wall. Catheter course is to the left with the tip in the left upper quadrant. Approximately 3.5 liters of ascites was able to be removed after catheter placement. IMPRESSION: Placement of tunneled peritoneal drainage catheter via right abdominal approach. 3.5 liters of ascites was removed today after catheter placement. Electronically Signed   By: Aletta Edouard M.D.   On: 01/01/2019 12:02   US Paracentesis  Result Date: 12/21/2018 INDICATION: Recurrent ascites EXAM: ULTRASOUND GUIDED PARACENTESIS MEDICATIONS: None. COMPLICATIONS: None immediate. PROCEDURE: Informed written consent was obtained from the patient after a discussion of the risks, benefits and alternatives to treatment. A timeout was performed prior to the initiation of the procedure. Initial ultrasound scanning demonstrates a  large amount of ascites within the right lower abdominal quadrant. The right lower abdomen was prepped and draped in the usual sterile fashion. 1% lidocaine was used for local anesthesia. Following this, a 6 Fr Safe-T-Centesis catheter was introduced. An ultrasound image was  saved for documentation purposes. The paracentesis was performed. The catheter was removed and a dressing was applied. The patient tolerated the procedure well without immediate post procedural complication. Patient received post-procedure intravenous albumin; see nursing notes for details. FINDINGS: A total of approximately 2.5 L of clear yellow fluid was removed. Samples were sent to the laboratory as requested by the clinical team. IMPRESSION: Successful ultrasound-guided paracentesis yielding 2.5 liters of peritoneal fluid. Electronically Signed   By: Inez Catalina M.D.   On: 12/21/2018 15:24   Dg Abd 2 Views  Result Date: 01/05/2019 CLINICAL DATA:  Nausea and constipation. Productive cough. Metastatic ovarian cancer. Peritoneal drainage catheter. EXAM: ABDOMEN - 2 VIEW COMPARISON:  Radiograph dated 08/10/2018 FINDINGS: There are no dilated loops of large or small bowel. There is extensive stool throughout the colon including in the rectum but there is no discrete fecal impaction. No significant air in the bowel. Ascites. Peritoneal drainage catheter tip is in the left upper quadrant. There are small bilateral pleural effusions. Surgical clips from previous cholecystectomy. No acute bone abnormality. IMPRESSION: 1. Extensive stool throughout the colon. No discrete fecal impaction. 2. No findings suggestive of bowel obstruction. 3. Small bilateral pleural effusions.  Ascites. Electronically Signed   By: Lorriane Shire M.D.   On: 01/05/2019 12:59    Subjective: Patient was lying down in her bed with complaint of severe abdominal pain and seems in distress due to pain.  She was having mild nausea but no vomiting. Her case was discussed extensively with her and she agrees to be transferred to [facility] for further management.  Discharge Exam: Vitals:   01/19/19 1224 01/19/19 1316  BP:  132/82  Pulse: 99 88  Resp: 18 18  Temp: 98.7 F (37.1 C) 98.7 F (37.1 C)  SpO2:  95%   Vitals:   01/19/19 1200  01/19/19 1215 01/19/19 1224 01/19/19 1316  BP: 109/71 109/77  132/82  Pulse: 95 95 99 88  Resp: (!) 31 (!) 32 18 18  Temp:   98.7 F (37.1 C) 98.7 F (37.1 C)  TempSrc:   Oral Oral  SpO2:    95%  Weight:      Height:        General: Pt is alert, awake, appears little distressed due to abdominal pain. Cardiovascular: RRR, S1/S2 +, no rubs, no gallops Respiratory: CTA bilaterally, no wheezing, no rhonchi Abdominal: Distended, diffusely tender, PD catheter in place, hypoactive bowel sounds. Extremities: no edema, no cyanosis   The results of significant diagnostics from this hospitalization (including imaging, microbiology, ancillary and laboratory) are listed below for reference.     Microbiology: Recent Results (from the past 240 hour(s))  SARS CORONAVIRUS 2 (TAT 6-24 HRS) Nasopharyngeal Nasopharyngeal Swab     Status: None   Collection Time: 01/17/19  8:19 PM   Specimen: Nasopharyngeal Swab  Result Value Ref Range Status   SARS Coronavirus 2 NEGATIVE NEGATIVE Final    Comment: (NOTE) SARS-CoV-2 target nucleic acids are NOT DETECTED. The SARS-CoV-2 RNA is generally detectable in upper and lower respiratory specimens during the acute phase of infection. Negative results do not preclude SARS-CoV-2 infection, do not rule out co-infections with other pathogens, and should not be used as the sole basis for treatment  or other patient management decisions. Negative results must be combined with clinical observations, patient history, and epidemiological information. The expected result is Negative. Fact Sheet for Patients: SugarRoll.be Fact Sheet for Healthcare Providers: https://www.woods-mathews.com/ This test is not yet approved or cleared by the [place] FDA and  has been authorized for detection and/or diagnosis of SARS-CoV-2 by FDA under an Emergency Use Authorization (EUA). This EUA will remain  in effect (meaning this test  can be used) for the duration of the COVID-19 declaration under Section 56 4(b)(1) of the Act, 21 U.S.C. section 360bbb-3(b)(1), unless the authorization is terminated or revoked sooner. Performed at [facility], [place] [address] [place], [place] [address]   MRSA PCR Screening     Status: None   Collection Time: 01/17/19 10:41 PM   Specimen: Nasal Mucosa; Nasopharyngeal  Result Value Ref Range Status   MRSA by PCR NEGATIVE NEGATIVE Final    Comment:        The GeneXpert MRSA Assay (FDA approved for NASAL specimens only), is one component of a comprehensive MRSA colonization surveillance program. It is not intended to diagnose MRSA infection nor to guide or monitor treatment for MRSA infections. Performed at [facility], [address] [place],  [address]      Labs: BNP (last 3 results) Recent Labs    08/28/18 0943 08/28/18 1443 10/08/18 0731  BNP >4,500.0* >4,500.0* 5,176.1*   Basic Metabolic Panel: Recent Labs  Lab 01/16/19 1256 01/17/19 0512 01/19/19 0322  NA 132* 132* 128*  K 4.2 3.9 4.8  CL 94* 94* 93*  CO2 25 28 26   GLUCOSE 90 80 93  BUN 48* 54* 29*  CREATININE 4.54* 4.80* 3.36*  CALCIUM 8.8* 8.6* 8.1*  PHOS  --   --  3.4   Liver Function Tests: Recent Labs  Lab 01/16/19 1256 01/19/19 0322  AST 54* 29  ALT 35 23  ALKPHOS 211* 155*  BILITOT 0.6 0.3  PROT 6.3* 5.8*  ALBUMIN 2.4* 2.1*   No results for input(s): LIPASE, AMYLASE in the last 168 hours. No results for input(s): AMMONIA in the last 168 hours. CBC: Recent Labs  Lab 01/16/19 1256 01/17/19 0512 01/19/19 0322  WBC 9.2 8.0 7.4  NEUTROABS  --   --  5.3  HGB 11.7* 10.8* 11.4*  HCT 37.4 35.4* 36.2  MCV 93.7 93.4 91.6  PLT 292 344 318   Cardiac Enzymes: No results for input(s): CKTOTAL, CKMB, CKMBINDEX, TROPONINI in the last 168 hours. BNP: Invalid input(s): POCBNP CBG: No results for input(s): GLUCAP in the last 168 hours. D-Dimer No results for  input(s): DDIMER in the last 72 hours. Hgb A1c No results for input(s): HGBA1C in the last 72 hours. Lipid Profile No results for input(s): CHOL, HDL, LDLCALC, TRIG, CHOLHDL, LDLDIRECT in the last 72 hours. Thyroid function studies No results for input(s): TSH, T4TOTAL, T3FREE, THYROIDAB in the last 72 hours.  Invalid input(s): FREET3 Anemia work up No results for input(s): VITAMINB12, FOLATE, FERRITIN, TIBC, IRON, RETICCTPCT in the last 72 hours. Urinalysis    Component Value Date/Time   COLORURINE YELLOW (A) 01/16/2019 1256   APPEARANCEUR HAZY (A) 01/16/2019 1256   LABSPEC 1.016 01/16/2019 1256   PHURINE 5.0 01/16/2019 [place] 01/16/2019 [place] 01/16/2019 [place] 01/16/2019 [place] 01/16/2019 [place] 01/16/2019 1256   NITRITE NEGATIVE 01/16/2019 [place] 01/16/2019 1256  Sepsis Labs Invalid input(s): PROCALCITONIN,  WBC,  LACTICIDVEN Microbiology Recent Results (from the past 240 hour(s))  SARS CORONAVIRUS 2 (TAT 6-24 HRS) Nasopharyngeal Nasopharyngeal Swab     Status: None   Collection Time: 01/17/19  8:19 PM   Specimen: Nasopharyngeal Swab  Result Value Ref Range Status   SARS Coronavirus 2 NEGATIVE NEGATIVE Final    Comment: (NOTE) SARS-CoV-2 target nucleic acids are NOT DETECTED. The SARS-CoV-2 RNA is generally detectable in upper and lower respiratory specimens during the acute phase of infection. Negative results do not preclude SARS-CoV-2 infection, do not rule out co-infections with other pathogens, and should not be used as the sole basis for treatment or other patient management decisions. Negative results must be combined with clinical observations, patient history, and epidemiological information. The expected result is Negative. Fact Sheet for Patients: SugarRoll.be Fact Sheet for Healthcare  Providers: https://www.woods-mathews.com/ This test is not yet approved or cleared by the [place] FDA and  has been authorized for detection and/or diagnosis of SARS-CoV-2 by FDA under an Emergency Use Authorization (EUA). This EUA will remain  in effect (meaning this test can be used) for the duration of the COVID-19 declaration under Section 56 4(b)(1) of the Act, 21 U.S.C. section 360bbb-3(b)(1), unless the authorization is terminated or revoked sooner. Performed at [facility], [place] [address], [place], [place] [address]   MRSA PCR Screening     Status: None   Collection Time: 01/17/19 10:41 PM   Specimen: Nasal Mucosa; Nasopharyngeal  Result Value Ref Range Status   MRSA by PCR NEGATIVE NEGATIVE Final    Comment:        The GeneXpert MRSA Assay (FDA approved for NASAL specimens only), is one component of a comprehensive MRSA colonization surveillance program. It is not intended to diagnose MRSA infection nor to guide or monitor treatment for MRSA infections. Performed at [facility], [address], [place], [place] [address]     Time coordinating discharge: Over 30 minutes  SIGNED:   Lorella Nimrod, MD  [facility] 01/19/2019, 1:20 PM Pager [phone]  If 7PM-7AM, please contact night-coverage [email or web address] Password [email or web address]

## 2019-01-19 NOTE — Plan of Care (Signed)
Because of severity of pt's diagnosis of bowel obstructions/necrosis because of mets, efforts are being made to get her a bed at [facility]. [facility] tsfer team member Verl Blalock called and said that at bed was available - pt will be going to [place].  Called report to [place], [place] at [phone].  Called [facility] - they are unable to transport today, so I called [facility]'s transport team.  [facility] wanted her to receive full time of dialysis - they drew off 1.2L.  Josh Borders, Palliative NP saw and changed code status to DNR.  Pain medicine given prior to transport.  [facility] transport arrived at approx 14:40.

## 2019-01-19 NOTE — Progress Notes (Signed)
Jessica Burgess, [place] 01/19/19  Subjective:  Patient still with significant abdominal pain and distention. CT yesterday concerning for malignant obstruction. Transfer to [facility] currently recommended. Patient did undergo dialysis yesterday.   Objective:  Vital signs in last 24 hours:  Temp:  [97.4 F (36.3 C)-98.7 F (37.1 C)] 98.7 F (37.1 C) (11/03 1316) Pulse Rate:  [25-100] 88 (11/03 1316) Resp:  [13-32] 18 (11/03 1316) BP: (91-149)/(58-87) 132/82 (11/03 1316) SpO2:  [54 %-98 %] 95 % (11/03 1316)  Weight change:  Filed Weights   01/16/19 1250 01/17/19 2043  Weight: 68 kg 68.8 kg    Intake/Output:    Intake/Output Summary (Last 24 hours) at 01/19/2019 1410 Last data filed at 01/19/2019 1224 Gross per 24 hour  Intake -  Output 3110 ml  Net -3110 ml     Physical Exam: General:  No acute distress, laying in the bed  HEENT  anicteric, moist oral mucous membranes  Pulm/lungs  clear bilateral, normal effort  CVS/Heart  regular, no rub  Abdomen:   Distention noted, diffuse tenderness noted.  Extremities:  2+ pitting edema  Neurologic:  Alert, oriented  Skin:  Bilateral lower extremity erythema  Access:  Left IJ PermCath, PD catheter in place       Basic Metabolic Panel:  Recent Labs  Lab 01/16/19 1256 01/17/19 0512 01/19/19 0322  NA 132* 132* 128*  K 4.2 3.9 4.8  CL 94* 94* 93*  CO2 25 28 26   GLUCOSE 90 80 93  BUN 48* 54* 29*  CREATININE 4.54* 4.80* 3.36*  CALCIUM 8.8* 8.6* 8.1*  PHOS  --   --  3.4     CBC: Recent Labs  Lab 01/16/19 1256 01/17/19 0512 01/19/19 0322  WBC 9.2 8.0 7.4  NEUTROABS  --   --  5.3  HGB 11.7* 10.8* 11.4*  HCT 37.4 35.4* 36.2  MCV 93.7 93.4 91.6  PLT 292 344 318      Lab Results  Component Value Date   HEPBSAG Negative 08/10/2018   HEPBSAB Reactive 08/10/2018      Microbiology:  Recent Results (from the past 240 hour(s))  SARS CORONAVIRUS 2 (TAT 6-24  HRS) Nasopharyngeal Nasopharyngeal Swab     Status: None   Collection Time: 01/17/19  8:19 PM   Specimen: Nasopharyngeal Swab  Result Value Ref Range Status   SARS Coronavirus 2 NEGATIVE NEGATIVE Final    Comment: (NOTE) SARS-CoV-2 target nucleic acids are NOT DETECTED. The SARS-CoV-2 RNA is generally detectable in upper and lower respiratory specimens during the acute phase of infection. Negative results do not preclude SARS-CoV-2 infection, do not rule out co-infections with other pathogens, and should not be used as the sole basis for treatment or other patient management decisions. Negative results must be combined with clinical observations, patient history, and epidemiological information. The expected result is Negative. Fact Sheet for Patients: SugarRoll.be Fact Sheet for Healthcare Providers: https://www.woods-mathews.com/ This test is not yet approved or cleared by the [place] FDA and  has been authorized for detection and/or diagnosis of SARS-CoV-2 by FDA under an Emergency Use Authorization (EUA). This EUA will remain  in effect (meaning this test can be used) for the duration of the COVID-19 declaration under Section 56 4(b)(1) of the Act, 21 U.S.C. section 360bbb-3(b)(1), unless the authorization is terminated or revoked sooner. Performed at [facility], [place] [address] [place], [place] [address]   MRSA PCR Screening     Status: None  Collection Time: 01/17/19 10:41 PM   Specimen: Nasal Mucosa; Nasopharyngeal  Result Value Ref Range Status   MRSA by PCR NEGATIVE NEGATIVE Final    Comment:        The GeneXpert MRSA Assay (FDA approved for NASAL specimens only), is one component of a comprehensive MRSA colonization surveillance program. It is not intended to diagnose MRSA infection nor to guide or monitor treatment for MRSA infections. Performed at [facility], [address] [place],  [place] [address]     Coagulation Studies: Recent Labs    01/19/19 0322  LABPROT 17.8*  INR 1.5*    Urinalysis: No results for input(s): COLORURINE, LABSPEC, PHURINE, GLUCOSEU, HGBUR, BILIRUBINUR, KETONESUR, PROTEINUR, UROBILINOGEN, NITRITE, LEUKOCYTESUR in the last 72 hours.  Invalid input(s): APPERANCEUR    Imaging: Ct Abdomen Pelvis W Contrast  Result Date: 01/18/2019 CLINICAL DATA:  Concern for bowel obstruction, stage IV ovarian cancer EXAM: CT ABDOMEN AND PELVIS WITH CONTRAST TECHNIQUE: Multidetector CT imaging of the abdomen and pelvis was performed using the standard protocol following bolus administration of intravenous contrast. CONTRAST:  144mL OMNIPAQUE IOHEXOL 300 MG/ML  SOLN COMPARISON:  CT abdomen pelvis 01/16/2019 FINDINGS: Lower chest: Small bilateral pleural effusions with mild adjacent atelectasis. Normal heart size. No pericardial effusion. Distal tip of a dialysis catheter terminates in the right atrium. Hepatobiliary: No focal liver abnormality is seen. Patient is post cholecystectomy. Slight prominence of the biliary tree likely related to reservoir effect. No calcified intraductal gallstones. Worsening intra and extrahepatic biliary ductal dilatation. No visible calcified intraductal gallstones. Pancreas: Pancreatic parenchyma appears compressed by the extensive, loculated ascites throughout the abdomen. No frank pancreatic ductal dilatation is evident. Spleen: Normal in size without focal abnormality. Adrenals/Urinary Tract: Adrenal glands are unremarkable. Kidneys are normal, without renal calculi, focal lesion, or hydronephrosis. Circumferential bladder wall thickening and perivesicular stranding. Stomach/Bowel: Distal esophagus and stomach are unremarkable. No frank small bowel thickening or dilatation. Extensive fecalization of the distal small bowel contents is noted. There is increasing colonic stool burden now with circumferential colonic thickening which worsens distally  with a developing pneumatosis in the transverse and descending colon. Portions of the more distal colonic wall a hypoattenuating appearance. There is focal caliber change in the left lower quadrant along the left pelvic sidewall. Rectum is partially distended with high attenuation rectal contrast media. The use of both oral and rectal contrast confound evaluation of the high attenuation material in the colon, unclear if this reflects passage of rectal contrast versus distal transit of a bolus of ingested oral contrast given at least some small volume contrast media seen opacifying the cecum. Vascular/Lymphatic: Atherosclerotic plaque within the normal caliber aorta. Scattered reactive appearing adenopathy in the abdomen. Further evaluation is limited given the loculated peritoneal fluid. Reproductive: Patient is post hysterectomy. There is an irregular thickening along the left lateral aspect of the vaginal cuff extending to the pelvic sidewall with loss of discernible fat plane with the adjacent sigmoid colon. Other: Loculated fluid collection throughout the abdomen with a drain in place. Associated peritoneal thickening is noted. Loculations extend into the deep pelvis adjacent the thickened portions of the colon. Scattered foci of gas are noted within these collections. There is abnormal omental nodularity in the left lower quadrant. Suspect additional nodules adjacent the bowel and along the pelvic sidewalls. Circumferential body wall edema. Musculoskeletal: Postsurgical changes from prior L4-5 spinal fusion. Multilevel degenerative changes are present in the imaged portions of the spine. No acute or suspicious osseous lesions. IMPRESSION: Findings concerning  for a large bowel obstruction with a competent ileocecal valve, can may function as a closed loop obstruction of the colon. Increasing colonic stool burden with interval development of colonic wall thickening and edema with hypoattenuation and pneumatosis  suspicious for vascular compromise, possibly secondary to pressure necrosis within the colon. Transition point is noted along the left pelvic sidewall where there is irregular soft tissue attenuation and loculated fluid and loss of discernible fat plane with the lateral aspect of the vaginal cuff likely related to patient's underlying ovarian cancer. Additional loculated fluid and omental nodularity throughout the abdomen with scattered foci of gas within the collection. Increasing intra and extrahepatic biliary ductal dilatation, greater than expected for the post cholecystectomy state. No intraductal gallstones. Correlate with liver serologies. Circumferential bladder wall thickening concerning for acute cystitis. Correlate with urinalysis. Moderate bilateral effusions. Aortic Atherosclerosis (ICD10-I70.0). These results were called by telephone at the time of interpretation on 01/18/2019 at 11:55 pm to provider NP Merlene Laughter, who verbally acknowledged these results. Electronically Signed   By: Lovena Le M.D.   On: 01/18/2019 23:50     Medications:   . sodium chloride 100 mL/hr at 01/19/19 0830   . amiodarone  200 mg Oral Daily  . apixaban  5 mg Oral BID  . Chlorhexidine Gluconate Cloth  6 each Topical Q0600  . hydrALAZINE  25 mg Oral TID  .  HYDROmorphone (DILAUDID) injection  0.5 mg Intravenous NOW  .  HYDROmorphone (DILAUDID) injection  2 mg Intravenous Once  . levothyroxine  25 mcg Oral QAC breakfast  . losartan  100 mg Oral Daily  . pantoprazole  40 mg Oral Daily   acetaminophen **OR** acetaminophen, HYDROmorphone (DILAUDID) injection, ondansetron **OR** ondansetron (ZOFRAN) IV, temazepam  Assessment/ Plan:  63 y.o. female with metastatic ovarian cancer, end-stage renal disease on hemodialysis, paroxysmal atrial fibrillation, ascites, hypothyroidism, GERD  Principal Problem:   Constipation Active Problems:   Gastro-esophageal reflux disease without esophagitis   Ovarian cancer,  unspecified laterality ([place])   Hypothyroidism due to medication   HTN (hypertension)   ESRD on dialysis (HCC)   (HFpEF) heart failure with preserved ejection fraction (HCC)   Paroxysmal atrial fibrillation (HCC)   Ascites   Peritoneal disease   Palliative care encounter  [facility]/left IJ PermCath/67 kg/TTS 1  #.  End-stage renal disease -We are planning for hemodialysis today.  Patient is pending transfer to [facility] and if transfer team arrives we can discontinue dialysis early.  #. Anemia of CKD  Lab Results  Component Value Date   HGB 11.4 (L) 01/19/2019  Continue to hold Epogen at this time.  #. SHPTH    Component Value Date/Time   PTH 148 (H) 08/10/2018 1640   Lab Results  Component Value Date   PHOS 3.4 01/19/2019  Phosphorus 3.4 at last check.  Phosphorus under good control.  #Severe constipation -CT scan abdomen and pelvis concerning for malignant obstruction.  Surgery is recommended referral to [facility].  #Ascites, metastatic ovarian cancer -Ascites can be drained via PD catheter.  Patient will request when she feels abdominal distention    LOS: 3 Kathrina Crosley 11/3/20202:10 PM  [place], [place]

## 2019-01-19 NOTE — Progress Notes (Signed)
Notified by Radiologist of acute finding on ABD CT scan. Given possible signs of developing bowel necrosis reassessment of patient was preformed. Patient remains alert and oriented with not acute sign of decompensation, Vital signs also remain stable. Goals of care discussion held with patient who endorsed she desires aggressive treatment including gastrointestinal surgery If indicated. Therefore, on call general surgeon Dr. Celine Ahr was notified of CT finding and reported no indications for emergent surgical interventions tonight. New orders received and implemented.

## 2019-01-19 NOTE — Progress Notes (Signed)
Hd started  

## 2019-01-19 NOTE — Progress Notes (Addendum)
[facility] SURGICAL PROGRESS NOTE (cpt [phone])  Hospital Day(s): 3.   Interval History: Patient seen and examined, had CT WITH contrast overnight was concerning for malignant obstruction. This morning she continues to endorse significant diffuse abdominal pain, unchanged, no relief. Endorses nausea. NO fever, chills, emesis. No BM despite aggressive bowel regimen.   Review of Systems:  Constitutional: denies fever, chills  HEENT: denies cough or congestion  Respiratory: denies any shortness of breath  Cardiovascular: denies chest pain or palpitations  Gastrointestinal: + abdominal pain, + distension, + nausea, denies emesis, or diarrhea/and bowel function as per interval history Genitourinary: denies burning with urination or urinary frequency  Vital signs in last 24 hours: [min-max] current  Temp:  [97.4 F (36.3 C)-98.4 F (36.9 C)] 98.4 F (36.9 C) (11/03 0356) Pulse Rate:  [25-89] 71 (11/03 0356) Resp:  [13-20] 15 (11/03 0356) BP: (91-149)/(58-87) 149/87 (11/03 0356) SpO2:  [54 %-98 %] 98 % (11/03 0356)     Height: 5\' 2"  (157.5 cm) Weight: 68.8 kg BMI (Calculated): 27.73   Intake/Output last 2 shifts:  11/02 0701 - 11/03 0700 In: 1020 [P.O.:1020] Out: 2631 [Drains:700]   Physical Exam:  Constitutional: alert, cooperative and no distress  HENT: normocephalic without obvious abnormality  Eyes: PERRL, EOM's grossly intact and symmetric  Respiratory: breathing non-labored at rest  Cardiovascular: regular rate and sinus rhythm  Gastrointestinal: soft, diffuse tenderness, grossly distended. Peritoneal catheter in right mid abdomen, draining ascites. Large well healed laparotomy incision Musculoskeletal: no edema or wounds, motor and sensation grossly intact, NT    Labs:  CBC Latest Ref Rng & Units 01/19/2019 01/17/2019 01/16/2019  WBC 4.0 - 10.5 K/uL 7.4 8.0 9.2  Hemoglobin 12.0 - 15.0 g/dL 11.4(L) 10.8(L) 11.7(L)  Hematocrit 36.0 - 46.0 % 36.2 35.4(L) 37.4   Platelets 150 - 400 K/uL 318 344 292   CMP Latest Ref Rng & Units 01/19/2019 01/17/2019 01/16/2019  Glucose 70 - 99 mg/dL 93 80 90  BUN 8 - 23 mg/dL 29(H) 54(H) 48(H)  Creatinine 0.44 - 1.00 mg/dL 3.36(H) 4.80(H) 4.54(H)  Sodium 135 - 145 mmol/L 128(L) 132(L) 132(L)  Potassium 3.5 - 5.1 mmol/L 4.8 3.9 4.2  Chloride 98 - 111 mmol/L 93(L) 94(L) 94(L)  CO2 22 - 32 mmol/L 26 28 25   Calcium 8.9 - 10.3 mg/dL 8.1(L) 8.6(L) 8.8(L)  Total Protein 6.5 - 8.1 g/dL 5.8(L) - 6.3(L)  Total Bilirubin 0.3 - 1.2 mg/dL 0.3 - 0.6  Alkaline Phos 38 - 126 U/L 155(H) - 211(H)  AST 15 - 41 U/L 29 - 54(H)  ALT 0 - 44 U/L 23 - 35     Imaging studies:  CT Abdomen/Pelvis (01/19/2019) personally reviewed and discussed with Dr Celine Ahr, concern for malignant obstruction, and radiologist report reviewed:  IMPRESSION: Findings concerning for a large bowel obstruction with a competent ileocecal valve, can may function as a closed loop obstruction of the colon. Increasing colonic stool burden with interval development of colonic wall thickening and edema with hypoattenuation and pneumatosis suspicious for vascular compromise, possibly secondary to pressure necrosis within the colon. Transition point is noted along the left pelvic sidewall where there is irregular soft tissue attenuation and loculated fluid and loss of discernible fat plane with the lateral aspect of the vaginal cuff likely related to patient's underlying ovarian cancer.  Additional loculated fluid and omental nodularity throughout the abdomen with scattered foci of gas within the collection.  Increasing intra and extrahepatic biliary ductal dilatation, greater than expected for the post cholecystectomy  state. No intraductal gallstones. Correlate with liver serologies.  Circumferential bladder wall thickening concerning for acute cystitis. Correlate with urinalysis.  Moderate bilateral effusions.  Aortic Atherosclerosis  (ICD10-I70.0).   Assessment/Plan: (ICD-10's: K22.609) 63 y.o. female with likely malignant bowel obstruction and significant carcinomatosis attributable to her history of metastatic ovarian CA.    - Given the extensive nature of her disease and likely malignant nature of her large bowel obstruction there is unfortunately no good solution that does not bear excessive risk. Even if there is an adequate surgical window within which to enter the abdomen (questionable, in light of carcinomatosis) and potentially bring up a loop transverse colostomy, the presence of extensive ascites will result in constant leakage from any incision or ostomy site.  The patient was discussed at length among the surgeons of our practice group. At this point, we recommend transfer to tertiary center (such as [facility], where she has received prior treatment) for evaluation and potential management as they have more resources available to manage any potential surgical intervention. This was discussed with hospitalist team    - NPO + IVF  - pain control prn; antiemetics prn  - closely monitor abdominal examination   - further management per primary service  All of the above findings and recommendations were discussed with the patient, patient's family (sister - via phone), and the medical team, and all of patient's and family's questions were answered to their expressed satisfaction.  -- Edison Simon, PA-C [facility] 01/19/2019, 7:32 AM [phone] M-F: 7am - 4pm   I saw and evaluated the patient.  I agree with the above documentation, exam, and plan, which I have edited where appropriate. Fredirick Maudlin  9:10 AM

## 2019-01-20 ENCOUNTER — Inpatient Hospital Stay: Payer: PPO | Admitting: Hospice and Palliative Medicine

## 2019-01-20 MED ORDER — LOSARTAN POTASSIUM 50 MG PO TABS
100.00 | ORAL_TABLET | ORAL | Status: DC
Start: 2019-01-21 — End: 2019-01-20

## 2019-01-20 MED ORDER — ALBUTEROL SULFATE HFA 108 (90 BASE) MCG/ACT IN AERS
1.00 | INHALATION_SPRAY | RESPIRATORY_TRACT | Status: DC
Start: ? — End: 2019-01-20

## 2019-01-20 MED ORDER — HYDRALAZINE HCL 10 MG PO TABS
10.00 | ORAL_TABLET | ORAL | Status: DC
Start: ? — End: 2019-01-20

## 2019-01-20 MED ORDER — NALOXONE HCL 0.4 MG/ML IJ SOLN
0.40 | INTRAMUSCULAR | Status: DC
Start: ? — End: 2019-01-20

## 2019-01-20 MED ORDER — MOMETASONE FURO-FORMOTEROL FUM 100-5 MCG/ACT IN AERO
2.00 | INHALATION_SPRAY | RESPIRATORY_TRACT | Status: DC
Start: ? — End: 2019-01-20

## 2019-01-20 MED ORDER — POLYETHYLENE GLYCOL 3350 17 G PO PACK
17.00 | PACK | ORAL | Status: DC
Start: ? — End: 2019-01-20

## 2019-01-20 MED ORDER — FLUTICASONE PROPIONATE 50 MCG/ACT NA SUSP
2.00 | NASAL | Status: DC
Start: ? — End: 2019-01-20

## 2019-01-20 MED ORDER — GENERIC EXTERNAL MEDICATION
500.00 | Status: DC
Start: 2019-01-21 — End: 2019-01-20

## 2019-01-20 MED ORDER — ATORVASTATIN CALCIUM 40 MG PO TABS
40.00 | ORAL_TABLET | ORAL | Status: DC
Start: 2019-01-20 — End: 2019-01-20

## 2019-01-20 MED ORDER — GENERIC EXTERNAL MEDICATION
324.00 | Status: DC
Start: 2019-01-21 — End: 2019-01-20

## 2019-01-20 MED ORDER — MAGNESIUM OXIDE 400 MG PO TABS
400.00 | ORAL_TABLET | ORAL | Status: DC
Start: 2019-01-25 — End: 2019-01-20

## 2019-01-20 MED ORDER — PANTOPRAZOLE SODIUM 40 MG PO TBEC
40.00 | DELAYED_RELEASE_TABLET | ORAL | Status: DC
Start: 2019-01-25 — End: 2019-01-20

## 2019-01-20 MED ORDER — LEVOTHYROXINE SODIUM 25 MCG PO TABS
25.00 | ORAL_TABLET | ORAL | Status: DC
Start: 2019-01-25 — End: 2019-01-20

## 2019-01-20 MED ORDER — FIRST-MOUTHWASH BLM MT SUSP
5.00 | OROMUCOSAL | Status: DC
Start: ? — End: 2019-01-20

## 2019-01-20 MED ORDER — ALPRAZOLAM 0.5 MG PO TABS
0.50 | ORAL_TABLET | ORAL | Status: DC
Start: ? — End: 2019-01-20

## 2019-01-20 MED ORDER — CHOLECALCIFEROL 50 MCG (2000 UT) PO TABS
2000.00 | ORAL_TABLET | ORAL | Status: DC
Start: 2019-01-25 — End: 2019-01-20

## 2019-01-20 MED ORDER — AMIODARONE HCL 200 MG PO TABS
200.00 | ORAL_TABLET | ORAL | Status: DC
Start: 2019-01-25 — End: 2019-01-20

## 2019-01-20 MED ORDER — METOPROLOL SUCCINATE ER 50 MG PO TB24
50.00 | ORAL_TABLET | ORAL | Status: DC
Start: 2019-01-25 — End: 2019-01-20

## 2019-01-20 MED ORDER — LACTATED RINGERS IV SOLN
INTRAVENOUS | Status: DC
Start: ? — End: 2019-01-20

## 2019-01-20 MED ORDER — MULTI-VITAMIN PO TABS
1.00 | ORAL_TABLET | ORAL | Status: DC
Start: 2019-01-25 — End: 2019-01-20

## 2019-01-20 MED ORDER — GENERIC EXTERNAL MEDICATION
100.00 | Status: DC
Start: 2019-01-21 — End: 2019-01-20

## 2019-01-20 MED ORDER — METOCLOPRAMIDE HCL 10 MG PO TABS
10.00 | ORAL_TABLET | ORAL | Status: DC
Start: ? — End: 2019-01-20

## 2019-01-20 MED ORDER — CALCIUM CARBONATE ANTACID 750 MG PO CHEW
CHEWABLE_TABLET | ORAL | Status: DC
Start: 2019-01-21 — End: 2019-01-20

## 2019-01-20 MED ORDER — ISOSORBIDE MONONITRATE ER 30 MG PO TB24
30.00 | ORAL_TABLET | ORAL | Status: DC
Start: 2019-01-25 — End: 2019-01-20

## 2019-01-20 MED ORDER — CYCLOBENZAPRINE HCL 10 MG PO TABS
10.00 | ORAL_TABLET | ORAL | Status: DC
Start: ? — End: 2019-01-20

## 2019-01-20 MED ORDER — OMEGA-3 1000 MG PO CAPS
1.00 | ORAL_CAPSULE | ORAL | Status: DC
Start: 2019-01-21 — End: 2019-01-20

## 2019-01-20 MED ORDER — SENNOSIDES-DOCUSATE SODIUM 8.6-50 MG PO TABS
2.00 | ORAL_TABLET | ORAL | Status: DC
Start: 2019-01-24 — End: 2019-01-20

## 2019-01-20 MED ORDER — SODIUM CHLORIDE 0.9 % IV SOLN
INTRAVENOUS | Status: DC
Start: ? — End: 2019-01-20

## 2019-01-20 MED ORDER — GENERIC EXTERNAL MEDICATION
12.50 | Status: DC
Start: ? — End: 2019-01-20

## 2019-01-20 MED ORDER — GENERIC EXTERNAL MEDICATION
Status: DC
Start: ? — End: 2019-01-20

## 2019-01-20 MED ORDER — ONDANSETRON HCL 4 MG/2ML IJ SOLN
4.00 | INTRAMUSCULAR | Status: DC
Start: ? — End: 2019-01-20

## 2019-01-20 MED ORDER — DULOXETINE HCL 20 MG PO CPEP
20.00 | ORAL_CAPSULE | ORAL | Status: DC
Start: 2019-01-21 — End: 2019-01-20

## 2019-01-20 MED ORDER — TORSEMIDE 20 MG PO TABS
40.00 | ORAL_TABLET | ORAL | Status: DC
Start: 2019-01-24 — End: 2019-01-20

## 2019-01-20 MED ORDER — POLYETHYLENE GLYCOL 3350 17 G PO PACK
17.00 | PACK | ORAL | Status: DC
Start: 2019-01-21 — End: 2019-01-20

## 2019-01-20 MED ORDER — IPRATROPIUM BROMIDE HFA 17 MCG/ACT IN AERS
2.00 | INHALATION_SPRAY | RESPIRATORY_TRACT | Status: DC
Start: ? — End: 2019-01-20

## 2019-01-20 MED ORDER — SUMATRIPTAN SUCCINATE 50 MG PO TABS
100.00 | ORAL_TABLET | ORAL | Status: DC
Start: ? — End: 2019-01-20

## 2019-01-20 MED ORDER — TEMAZEPAM 15 MG PO CAPS
30.00 | ORAL_CAPSULE | ORAL | Status: DC
Start: 2019-01-24 — End: 2019-01-20

## 2019-01-20 MED ORDER — LIDOCAINE HCL 1 % IJ SOLN
0.50 | INTRAMUSCULAR | Status: DC
Start: ? — End: 2019-01-20

## 2019-01-20 MED ORDER — APIXABAN 5 MG PO TABS
5.00 | ORAL_TABLET | ORAL | Status: DC
Start: 2019-01-24 — End: 2019-01-20

## 2019-01-20 MED ORDER — HYDRALAZINE HCL 50 MG PO TABS
25.00 | ORAL_TABLET | ORAL | Status: DC
Start: 2019-01-24 — End: 2019-01-20

## 2019-01-22 MED ORDER — LOSARTAN POTASSIUM 50 MG PO TABS
50.00 | ORAL_TABLET | ORAL | Status: DC
Start: 2019-01-25 — End: 2019-01-22

## 2019-01-22 MED ORDER — DULOXETINE HCL 20 MG PO CPEP
20.00 | ORAL_CAPSULE | ORAL | Status: DC
Start: 2019-01-25 — End: 2019-01-22

## 2019-01-22 MED ORDER — GENERIC EXTERNAL MEDICATION
0.80 | Status: DC
Start: ? — End: 2019-01-22

## 2019-01-22 MED ORDER — POLYETHYLENE GLYCOL 3350 17 G PO PACK
17.00 | PACK | ORAL | Status: DC
Start: 2019-01-24 — End: 2019-01-22

## 2019-01-22 MED ORDER — GENERIC EXTERNAL MEDICATION
Status: DC
Start: ? — End: 2019-01-22

## 2019-01-22 MED ORDER — SIMETHICONE 80 MG PO CHEW
80.00 | CHEWABLE_TABLET | ORAL | Status: DC
Start: ? — End: 2019-01-22

## 2019-01-22 MED ORDER — FAMOTIDINE 20 MG/2ML IV SOLN
10.00 | INTRAVENOUS | Status: DC
Start: 2019-01-24 — End: 2019-01-22

## 2019-01-22 MED ORDER — FENTANYL 25 MCG/HR TD PT72
1.00 | MEDICATED_PATCH | TRANSDERMAL | Status: DC
Start: 2019-01-24 — End: 2019-01-22

## 2019-01-22 MED ORDER — CALCIUM CARBONATE ANTACID 750 MG PO CHEW
CHEWABLE_TABLET | ORAL | Status: DC
Start: ? — End: 2019-01-22

## 2019-01-22 MED ORDER — HEPARIN SODIUM (PORCINE) 1000 UNIT/ML IJ SOLN
INTRAMUSCULAR | Status: DC
Start: ? — End: 2019-01-22

## 2019-01-25 ENCOUNTER — Telehealth: Payer: Self-pay | Admitting: *Deleted

## 2019-01-25 MED ORDER — HYDROMORPHONE HCL 4 MG PO TABS
4.00 | ORAL_TABLET | ORAL | Status: DC
Start: ? — End: 2019-01-25

## 2019-01-25 NOTE — Telephone Encounter (Signed)
Ok that's fine

## 2019-01-25 NOTE — Telephone Encounter (Signed)
Jessica [facility] with [facility] called asking about a referral for [facility] for drainage. Please return her call [phone] ext [phone]

## 2019-01-25 NOTE — Telephone Encounter (Signed)
I am ok to sign orders for home health, but she was at [facility] recently and I don't know what she means by "drainage".

## 2019-01-25 NOTE — Telephone Encounter (Signed)
Per chart notes, patient has a RLQ Pleurex drain in place and [facility] is with her insurance company and we need to send a referral to a [facility] for SN to assist her with the drainage of her catheter

## 2019-01-26 ENCOUNTER — Other Ambulatory Visit: Payer: Self-pay | Admitting: Oncology

## 2019-01-26 DIAGNOSIS — Z992 Dependence on renal dialysis: Secondary | ICD-10-CM | POA: Diagnosis not present

## 2019-01-26 DIAGNOSIS — N179 Acute kidney failure, unspecified: Secondary | ICD-10-CM | POA: Diagnosis not present

## 2019-01-26 DIAGNOSIS — D631 Anemia in chronic kidney disease: Secondary | ICD-10-CM | POA: Diagnosis not present

## 2019-01-26 DIAGNOSIS — N184 Chronic kidney disease, stage 4 (severe): Secondary | ICD-10-CM | POA: Diagnosis not present

## 2019-01-26 DIAGNOSIS — C569 Malignant neoplasm of unspecified ovary: Secondary | ICD-10-CM

## 2019-01-26 DIAGNOSIS — N186 End stage renal disease: Secondary | ICD-10-CM | POA: Diagnosis not present

## 2019-01-26 DIAGNOSIS — D509 Iron deficiency anemia, unspecified: Secondary | ICD-10-CM | POA: Diagnosis not present

## 2019-01-26 NOTE — Telephone Encounter (Signed)
Working on the referral

## 2019-01-26 NOTE — Telephone Encounter (Signed)
Referral has been sent.

## 2019-01-27 ENCOUNTER — Telehealth: Payer: Self-pay

## 2019-01-27 ENCOUNTER — Telehealth: Payer: Self-pay | Admitting: Emergency Medicine

## 2019-01-27 NOTE — Telephone Encounter (Signed)
Copied from [place] [id number]. Topic: General - Other >> Jan 27, 2019  2:08 PM Wynetta Emery, [place] C wrote: Reason for CRM: pt says that she received a call to schedule ov.   Pt says that she is in pallative care at the cancer center. Pt says that she is seeing a provider with palliative care and doesn't need to see PCP anymore.

## 2019-01-27 NOTE — Telephone Encounter (Signed)
Called and spoke with pt's sister and pt regarding pleurex supplies and [facility] at [place] stating pt needing home health provider. Pt's sister [name] stated that they had not yet received any supplies from [place], but she had received a phone call and was told that there was an issue with the insurance, but she wasn't sure what it was. Per pt's chart, written order had been faxed to [facility] on 01/05/19.  When talking with pt and sister regarding need for home health, they both stated that pt is fairly comfortable draining her catheter, however would like to be observed doing it one more time to ensure that she is doing it correctly. Message sent to scheduling to have pt scheduled for nurse visit. Neither pt nor sister had any further questions or concerns at this time.   Spoke with representative from [facility] who stated that the called the patient on 01/11/19 and the patient canceled the order. Will fill out another order form and refax, per representative fax order can take up to 72 hours. Will follow up beginning of next week with pt to see if she has received supplies.

## 2019-01-28 ENCOUNTER — Encounter: Payer: Self-pay | Admitting: *Deleted

## 2019-01-28 ENCOUNTER — Inpatient Hospital Stay: Payer: PPO | Admitting: *Deleted

## 2019-01-28 ENCOUNTER — Other Ambulatory Visit: Payer: Self-pay | Admitting: Hospice and Palliative Medicine

## 2019-01-28 DIAGNOSIS — C569 Malignant neoplasm of unspecified ovary: Secondary | ICD-10-CM

## 2019-02-01 ENCOUNTER — Ambulatory Visit
Admission: RE | Admit: 2019-02-01 | Discharge: 2019-02-01 | Disposition: A | Discharge status: Home / Self Care | Payer: PPO | Source: Ambulatory Visit | Attending: Hospice and Palliative Medicine | Admitting: Hospice and Palliative Medicine

## 2019-02-01 ENCOUNTER — Other Ambulatory Visit: Payer: Self-pay

## 2019-02-01 ENCOUNTER — Inpatient Hospital Stay: Payer: PPO

## 2019-02-01 ENCOUNTER — Inpatient Hospital Stay: Payer: PPO | Attending: Hospice and Palliative Medicine | Admitting: Hospice and Palliative Medicine

## 2019-02-01 VITALS — BP 121/78 | HR 88 | Temp 96.0°F | Resp 20 | Ht 62.0 in

## 2019-02-01 DIAGNOSIS — C569 Malignant neoplasm of unspecified ovary: Secondary | ICD-10-CM | POA: Diagnosis not present

## 2019-02-01 DIAGNOSIS — Z515 Encounter for palliative care: Secondary | ICD-10-CM | POA: Diagnosis not present

## 2019-02-01 DIAGNOSIS — Z87891 Personal history of nicotine dependence: Secondary | ICD-10-CM | POA: Insufficient documentation

## 2019-02-01 DIAGNOSIS — Z79899 Other long term (current) drug therapy: Secondary | ICD-10-CM | POA: Insufficient documentation

## 2019-02-01 DIAGNOSIS — R188 Other ascites: Secondary | ICD-10-CM | POA: Diagnosis not present

## 2019-02-01 DIAGNOSIS — M542 Cervicalgia: Secondary | ICD-10-CM

## 2019-02-01 DIAGNOSIS — N186 End stage renal disease: Secondary | ICD-10-CM | POA: Insufficient documentation

## 2019-02-01 DIAGNOSIS — Z992 Dependence on renal dialysis: Secondary | ICD-10-CM | POA: Insufficient documentation

## 2019-02-01 DIAGNOSIS — Z9221 Personal history of antineoplastic chemotherapy: Secondary | ICD-10-CM | POA: Insufficient documentation

## 2019-02-01 DIAGNOSIS — G8929 Other chronic pain: Secondary | ICD-10-CM | POA: Diagnosis not present

## 2019-02-01 DIAGNOSIS — K5900 Constipation, unspecified: Secondary | ICD-10-CM | POA: Insufficient documentation

## 2019-02-01 DIAGNOSIS — M5441 Lumbago with sciatica, right side: Secondary | ICD-10-CM | POA: Diagnosis not present

## 2019-02-01 MED ORDER — OXYCODONE-ACETAMINOPHEN 10-325 MG PO TABS: 1.0000 | ORAL_TABLET | ORAL | 0 refills | Status: DC | PRN

## 2019-02-01 NOTE — Progress Notes (Signed)
[place]  [phone] [phone]   Name: Jessica Burgess Date: 02/01/2019 MRN: [id number]  DOB: 1955/10/26  Patient Care Team: Steele Sizer, MD as PCP - General (Family Medicine) End, Harrell Gave, MD as PCP - Cardiology (Cardiology) Lloyd Huger, MD as Consulting Physician (Oncology) Mellody Drown, MD as Consulting Physician (Obstetrics and Gynecology) Cathi Roan, [facility] (Pharmacist) Clent Jacks, RN as Registered Nurse    REASON FOR CONSULTATION: Palliative Care consult requested for [phone] y.o.femalewith multiple medical problems including stage IVhigh-grade serous ovarian carcinoma.She is status post debulking surgery at [facility] in 2018 and received one preoperative dose of Keytruda as part of a clinical trial. Patient completed adjuvant carboplatinum and Taxol, which was discontinued secondary to worsening neuropathy. Patient was also tried on Avastin and doxorubicin due to disease progression but was ultimately switched to gemcitabine secondary to declining performance status.Treatment was ultimately discontinued due to disease progression and intolerance.PMH is also notable for ESRD onhemodialysis,severe diastolic dysfunction with recurrent CHF,severe pulmonary HTN, and recurrent ascites s/p pleurx catheter. Patient wasadmitted to the hospital on 10/31 with severe constipation after failing outpatient management. She was eventually transferred to [facility] where she eventually responded to conservative treatment. Patient was referred to palliative care to help address symptoms and goals.   SOCIAL HISTORY:     reports that she quit smoking about 19 years ago. Her smoking use included cigarettes. She started smoking about 39 years ago. She has a 30.00 pack-year smoking history. She has never used smokeless tobacco. She reports previous alcohol use. She reports that she does not use drugs.    Patient is unmarried. Has no children. She lives at home with her sister. She also has two brothers. Patient formerly worked at the [employer] and then [employer] improvement.  ADVANCE DIRECTIVES:  Sister is HCPOA  CODE STATUS: DNR (MOST form completed 11/03/18)  PAST MEDICAL HISTORY: Past Medical History:  Diagnosis Date   Allergic rhinitis, cause unspecified    Anxiety state, unspecified    Arthritis    Asthma    only when sick    Backache, unspecified    Bronchitis    hx of when get sick   Cancer [facility])    skin cancer , basal cell    Cancer ([place]) 11/2016   ovarian   Cervicalgia    CHF (congestive heart failure) (HCC)    Complication of anesthesia    Dermatophytosis of nail    Dysmetabolic syndrome X    Encounter for long-term (current) use of other medications    Esophageal reflux    Hypertension    Insomnia, unspecified    Leukocytosis, unspecified    Migraine without aura, without mention of intractable migraine without mention of status migrainosus    Other and unspecified hyperlipidemia    Other malaise and fatigue    Overweight(278.02)    Personal history of chemotherapy now   ovarian   Renal insufficiency    Spinal stenosis in cervical region    Symptomatic menopausal or female climacteric states    Unspecified disorder of skin and subcutaneous tissue    Unspecified vitamin D deficiency     PAST SURGICAL HISTORY:  Past Surgical History:  Procedure Laterality Date   ABDOMINAL HYSTERECTOMY     ANTERIOR CERVICAL DECOMP/DISCECTOMY FUSION N/A 06/07/2015   Procedure: Cervical three - four and Cervical six- seven anterior cervical decompression with fusion interbody prosthesis plating and bonegraft;  Surgeon: Newman Pies, MD;  Location:  [place] [facility];  Service: Neurosurgery;  Laterality: N/A;  C34 and C67 anterior cervical decompression with fusion interbody prosthesis plating and bonegraft   BACK SURGERY     x2 Lower      DIALYSIS/PERMA CATHETER INSERTION N/A 08/17/2018   Procedure: DIALYSIS/PERMA CATHETER INSERTION;  Surgeon: Algernon Huxley, MD;  Location: [place] [facility] LAB;  Service: Cardiovascular;  Laterality: N/A;   EVACUATION OF CERVICAL HEMATOMA N/A 06/14/2015   Procedure: EVACUATION OF CERVICAL HEMATOMA;  Surgeon: Newman Pies, MD;  Location: [place] [facility];  Service: Neurosurgery;  Laterality: N/A;   IR GUIDED DRAIN W CATHETER PLACEMENT  01/01/2019   NECK SURGERY     x3   TONSILLECTOMY      HEMATOLOGY/ONCOLOGY HISTORY:  Oncology History  Ovarian cancer, unspecified laterality ([place])  11/12/2016 Initial Diagnosis   Ovarian cancer, unspecified laterality ([place])   08/08/2017 - 12/07/2017 Chemotherapy   The patient had bevacizumab (AVASTIN) 700 mg in sodium chloride 0.9 % 100 mL chemo infusion, 725 mg, Intravenous,  Once, 4 of 6 cycles Administration: 700 mg (08/18/2017), 700 mg (09/01/2017), 700 mg (09/15/2017), 700 mg (09/29/2017), 700 mg (10/13/2017), 700 mg (10/27/2017), 700 mg (11/10/2017), 700 mg (11/24/2017) DOXOrubicin HCL LIPOSOMAL (DOXIL) 70 mg in dextrose 5 % 250 mL chemo infusion, 72 mg, Intravenous,  Once, 4 of 6 cycles Administration: 70 mg (08/18/2017), 70 mg (09/15/2017), 70 mg (10/13/2017), 70 mg (11/10/2017)  for chemotherapy treatment.    02/19/2018 -  Chemotherapy   The patient had gemcitabine (GEMZAR) 1,800 mg in sodium chloride 0.9 % 250 mL chemo infusion, 1,748 mg, Intravenous,  Once, 7 of 9 cycles Dose modification: 900 mg/m2 (original dose 1,000 mg/m2, Cycle 1, Reason: Other (see comments)) Administration: 1,800 mg (02/19/2018), 1,800 mg (02/26/2018), 1,600 mg (03/05/2018), 1,600 mg (03/23/2018), 1,600 mg (03/30/2018), 1,600 mg (04/06/2018), 1,600 mg (04/20/2018), 1,600 mg (04/27/2018), 1,600 mg (05/25/2018), 1,600 mg (06/01/2018), 1,600 mg (06/08/2018), 1,600 mg (06/22/2018), 1,600 mg (06/29/2018), 1,600 mg (07/13/2018), 1,600 mg (07/27/2018), 1,600 mg (09/24/2018), 1,600 mg (10/06/2018)  for chemotherapy  treatment.      ALLERGIES:  has No Known Allergies.  MEDICATIONS:  Current Outpatient Medications  Medication Sig Dispense Refill   albuterol (PROVENTIL HFA;VENTOLIN HFA) 108 (90 Base) MCG/ACT inhaler Inhale 2 puffs into the lungs every 6 (six) hours as needed for wheezing or shortness of breath. 1 Inhaler 2   albuterol (PROVENTIL) (2.5 MG/3ML) 0.083% nebulizer solution Take 3 mLs (2.5 mg total) by nebulization every 6 (six) hours as needed for wheezing or shortness of breath. 75 mL 2   albuterol (PROVENTIL) (2.5 MG/3ML) 0.083% nebulizer solution Inhale 2.5 mg into the lungs as needed for shortness of breath.     ALPRAZolam (XANAX) 0.25 MG tablet TAKE 1 TABLET(0.25 MG) BY MOUTH AT BEDTIME AS NEEDED FOR ANXIETY (Patient taking differently: Take 0.25 mg by mouth at bedtime as needed for anxiety. ) 30 tablet 1   amiodarone (PACERONE) 200 MG tablet Take 1 tablet (200 mg total) by mouth daily. 90 tablet 3   cetirizine (ZYRTEC) 10 MG tablet Take 10 mg by mouth daily.     Cholecalciferol (VITAMIN D-1000 MAX ST) 1000 UNITS tablet Take 1,000 Units by mouth 2 (two) times daily.      cyclobenzaprine (FLEXERIL) 10 MG tablet Take 1 tablet (10 mg total) by mouth every 8 (eight) hours as needed. 30 tablet 1   DULoxetine (CYMBALTA) 60 MG capsule Take 1 capsule (60 mg total) by mouth daily. 90 capsule 0   fentaNYL (DURAGESIC) 12  MCG/HR Place 1 patch onto the skin every 3 (three) days. 10 patch 0   ferrous sulfate (IRON SUPPLEMENT) 325 (65 FE) MG tablet Take 325 mg by mouth daily.      hydrALAZINE (APRESOLINE) 25 MG tablet Take 1 tablet (25 mg total) by mouth 3 (three) times daily. For BP if above 150/90 90 tablet 0   HYDROmorphone (DILAUDID) 4 MG tablet Take 2 tablets by mouth every 12 (twelve) hours.     hydrOXYzine (ATARAX/VISTARIL) 10 MG tablet Take 10 mg by mouth 3 (three) times daily as needed for itching.      ipratropium (ATROVENT HFA) 17 MCG/ACT inhaler Inhale 2 puffs into the lungs  every 6 (six) hours as needed for wheezing.      isosorbide mononitrate (IMDUR) 30 MG 24 hr tablet Take 1 tablet (30 mg total) by mouth daily. 90 tablet 1   levothyroxine (SYNTHROID) 25 MCG tablet Take 1 tablet (25 mcg total) by mouth daily before breakfast. 30 tablet 2   losartan (COZAAR) 50 MG tablet Take 1 tablet by mouth daily.     Lysine 1000 MG TABS Take 1,000 mg by mouth daily.      Magnesium Oxide 400 (240 Mg) MG TABS Take 400 mg by mouth 2 (two) times daily.      metoCLOPramide (REGLAN) 5 MG tablet Take 5 mg by mouth 3 (three) times daily as needed for nausea.     metoprolol succinate (TOPROL-XL) 50 MG 24 hr tablet Take 1 tablet (50 mg total) by mouth daily. Take with or immediately following a meal. 90 tablet 1   mometasone-formoterol (DULERA) 200-5 MCG/ACT AERO Inhale 2 puffs into the lungs 2 (two) times daily as needed for wheezing or shortness of breath.      Multiple Vitamins-Minerals (MULTIVITAMIN PO) Take 1 tablet by mouth daily.      Omega-3 Fatty Acids (FISH OIL) 1000 MG CAPS Take 1,000 capsules by mouth 2 (two) times daily.      ondansetron (ZOFRAN-ODT) 8 MG disintegrating tablet Take 1 tablet (8 mg total) by mouth every 8 (eight) hours as needed for nausea or vomiting. 60 tablet 3   pantoprazole (PROTONIX) 40 MG tablet Take 1 tablet (40 mg total) by mouth daily. 90 tablet 0   senna-docusate (SENOKOT-S) 8.6-50 MG tablet Take 2 tablets by mouth 2 (two) times daily.     SUMAtriptan (IMITREX) 100 MG tablet Take 1 tablet (100 mg total) by mouth daily as needed for migraine. May repeat in 2 hours if headache persists or recurs. 10 tablet 2   temazepam (RESTORIL) 30 MG capsule Take 1 capsule (30 mg total) by mouth at bedtime as needed for sleep. 90 capsule 1   torsemide (DEMADEX) 20 MG tablet Take 2 tablets (40 mg total) by mouth 2 (two) times daily. 120 tablet 0   vitamin B-12 (CYANOCOBALAMIN) 1000 MCG tablet Take 1,000 mcg by mouth daily.     vitamin C (ASCORBIC  ACID) 500 MG tablet Take 500 mg by mouth daily.     apixaban (ELIQUIS) 5 MG TABS tablet Take 1 tablet (5 mg total) by mouth 2 (two) times daily. 180 tablet 2   fluticasone (FLONASE) 50 MCG/ACT nasal spray Place 2 sprays into both nostrils as needed. (Patient not taking: Reported on 02/01/2019) 48 g 1   lidocaine (XYLOCAINE) 2 % solution Use as directed 15 mLs in the mouth or throat as needed for mouth pain. (Patient not taking: Reported on 01/17/2019) 100 mL 0   No current  facility-administered medications for this visit.    Facility-Administered Medications Ordered in Other Visits  Medication Dose Route Frequency Provider Last Rate Last Dose   0.9 %  sodium chloride infusion   Intravenous Once Grayland Ormond, Kathlene November, MD       0.9 %  sodium chloride infusion   Intravenous Once Grayland Ormond, Kathlene November, MD       heparin lock flush 100 unit/mL  500 Units Intravenous Once Lloyd Huger, MD       sodium chloride flush (NS) 0.9 % injection 10 mL  10 mL Intravenous PRN Lloyd Huger, MD   10 mL at 06/16/17 0854   sodium chloride flush (NS) 0.9 % injection 10 mL  10 mL Intravenous Once Lloyd Huger, MD        VITAL SIGNS: BP 121/78    Pulse 88    Temp (!) 96 F (35.6 C) (Tympanic)    Resp 20    Ht 5\' 2"  (1.575 m)    BMI 27.74 kg/m  There were no vitals filed for this visit.  Estimated body mass index is 27.74 kg/m as calculated from the following:   Height as of this encounter: 5\' 2"  (1.575 m).   Weight as of 01/17/19: 151 lb 10.8 oz (68.8 kg).  LABS: CBC:    Component Value Date/Time   WBC 7.4 01/19/2019 0322   HGB 11.4 (L) 01/19/2019 0322   HCT 36.2 01/19/2019 0322   PLT 318 01/19/2019 0322   MCV 91.6 01/19/2019 0322   NEUTROABS 5.3 01/19/2019 0322   LYMPHSABS 1.1 01/19/2019 0322   MONOABS 0.7 01/19/2019 0322   EOSABS 0.2 01/19/2019 0322   BASOSABS 0.0 01/19/2019 0322   Comprehensive Metabolic Panel:    Component Value Date/Time   NA 128 (L) 01/19/2019 0322    NA 139 07/31/2015 1246   K 4.8 01/19/2019 0322   CL 93 (L) 01/19/2019 0322   CO2 26 01/19/2019 0322   BUN 29 (H) 01/19/2019 0322   BUN 15 07/31/2015 1246   CREATININE 3.36 (H) 01/19/2019 0322   CREATININE 0.90 05/17/2016 1052   GLUCOSE 93 01/19/2019 0322   CALCIUM 8.1 (L) 01/19/2019 0322   AST 29 01/19/2019 0322   AST 32 02/19/2012 0935   ALT 23 01/19/2019 0322   ALT 36 02/19/2012 0935   ALKPHOS 155 (H) 01/19/2019 0322   ALKPHOS 96 02/19/2012 0935   BILITOT 0.3 01/19/2019 0322   BILITOT 0.5 07/31/2015 1246   BILITOT 0.4 02/19/2012 0935   PROT 5.8 (L) 01/19/2019 0322   PROT 6.6 07/31/2015 1246   PROT 6.9 02/19/2012 0935   ALBUMIN 2.1 (L) 01/19/2019 0322   ALBUMIN 4.3 07/31/2015 1246   ALBUMIN 3.6 02/19/2012 0935    RADIOGRAPHIC STUDIES: Ct Abdomen Pelvis Wo Contrast  Result Date: 01/16/2019 CLINICAL DATA:  No bowel movement 9 days.  Stage IV ovarian cancer. EXAM: CT ABDOMEN AND PELVIS WITHOUT CONTRAST TECHNIQUE: Multidetector CT imaging of the abdomen and pelvis was performed following the standard protocol without IV contrast. COMPARISON:  November 06, 2018 FINDINGS: Lower chest: Bilateral pleural effusions persist but are smaller in the interval. There is fluid adjacent to the distal esophagus extending from the abdomen. Hepatobiliary: The patient is status post cholecystectomy. The liver is a otherwise unremarkable on this unenhanced study. Common bile duct dilatation appears similar, likely due to previous cholecystectomy. Pancreas: The pancreas is poorly evaluated due to diffuse ascites and lack of contrast. No obvious pancreatic abnormality identified. Spleen: Normal in size  without focal abnormality. Adrenals/Urinary Tract: Adrenal glands are normal. No renal stones or obstruction identified. No ureteral stones are noted. The bladder is unremarkable. Stomach/Bowel: The stomach and small bowel are normal. Severe fecal loading extends from the cecum into the mid sigmoid colon. An  underlying cause is not seen. No pneumatosis noted. The appendix is not visualized but there is no secondary evidence of appendicitis. Vascular/Lymphatic: Mild atherosclerotic changes seen in the abdominal aorta. No definite adenopathy. Reproductive: Status post hysterectomy. No adnexal masses. Other: Severe ascites throughout the abdomen is significantly worsened in the interval. The peritoneal drain appears to be in good position, terminating in the left upper quadrant. It is difficult to evaluate for peritoneal or omental disease given the lack of contrast and the presence of ascites. However, there does appear to be omental/peritoneal nodularity on the left such as on axial image 37, more prominent the interval. Musculoskeletal: No acute or significant osseous findings. IMPRESSION: 1. Severe fecal loading from the cecum through the mid sigmoid colon of uncertain etiology. 2. The bilateral pleural effusions are smaller in the interval but persist. 3. The amount of ascites is significantly increased in the interval. 4. Evaluation for omental/peritoneal metastases is limited due to lack of contrast. However, there appears to be omental/peritoneal disease on the left which is more prominent the interval. 5. Mild atherosclerotic change in the abdominal aorta. Electronically Signed   By: Dorise Bullion III M.D   On: 01/16/2019 16:21   Dg Chest 2 View  Result Date: 01/05/2019 CLINICAL DATA:  Productive cough. Vomiting. Constipation. Metastatic ovarian cancer. EXAM: CHEST - 2 VIEW COMPARISON:  Chest x-ray dated 10/13/2018 and 10/10/2018 FINDINGS: Power port in place with the tip in the superior vena cava just below the level of the carina. Double lumen dialysis catheter in place with the tips at the cavoatrial junction. Heart size and pulmonary vascularity are normal. There are minimal bilateral pleural effusions. No discrete infiltrates. Minimal atelectasis at the left lung base. No acute bone abnormality.  IMPRESSION: 1. Minimal bilateral pleural effusions. 2. Minimal atelectasis at the left lung base. 3. No discrete infiltrates. Electronically Signed   By: Lorriane Shire M.D.   On: 01/05/2019 13:01   Dg Abdomen 1 View  Result Date: 01/16/2019 CLINICAL DATA:  Constipation. EXAM: ABDOMEN - 1 VIEW COMPARISON:  January 05, 2019 FINDINGS: Severe fecal loading is seen throughout the length of the colon. A peritoneal drainage catheters again identified, terminating in the left upper quadrant. Pleural effusions are noted. No other acute abnormalities. IMPRESSION: Severe fecal loading in the colon. Small pleural effusions. Stable peritoneal drainage catheter. Electronically Signed   By: Dorise Bullion III M.D   On: 01/16/2019 14:59   Ct Abdomen Pelvis W Contrast  Result Date: 01/18/2019 CLINICAL DATA:  Concern for bowel obstruction, stage IV ovarian cancer EXAM: CT ABDOMEN AND PELVIS WITH CONTRAST TECHNIQUE: Multidetector CT imaging of the abdomen and pelvis was performed using the standard protocol following bolus administration of intravenous contrast. CONTRAST:  165mL OMNIPAQUE IOHEXOL 300 MG/ML  SOLN COMPARISON:  CT abdomen pelvis 01/16/2019 FINDINGS: Lower chest: Small bilateral pleural effusions with mild adjacent atelectasis. Normal heart size. No pericardial effusion. Distal tip of a dialysis catheter terminates in the right atrium. Hepatobiliary: No focal liver abnormality is seen. Patient is post cholecystectomy. Slight prominence of the biliary tree likely related to reservoir effect. No calcified intraductal gallstones. Worsening intra and extrahepatic biliary ductal dilatation. No visible calcified intraductal gallstones. Pancreas: Pancreatic parenchyma appears compressed by the  extensive, loculated ascites throughout the abdomen. No frank pancreatic ductal dilatation is evident. Spleen: Normal in size without focal abnormality. Adrenals/Urinary Tract: Adrenal glands are unremarkable. Kidneys are  normal, without renal calculi, focal lesion, or hydronephrosis. Circumferential bladder wall thickening and perivesicular stranding. Stomach/Bowel: Distal esophagus and stomach are unremarkable. No frank small bowel thickening or dilatation. Extensive fecalization of the distal small bowel contents is noted. There is increasing colonic stool burden now with circumferential colonic thickening which worsens distally with a developing pneumatosis in the transverse and descending colon. Portions of the more distal colonic wall a hypoattenuating appearance. There is focal caliber change in the left lower quadrant along the left pelvic sidewall. Rectum is partially distended with high attenuation rectal contrast media. The use of both oral and rectal contrast confound evaluation of the high attenuation material in the colon, unclear if this reflects passage of rectal contrast versus distal transit of a bolus of ingested oral contrast given at least some small volume contrast media seen opacifying the cecum. Vascular/Lymphatic: Atherosclerotic plaque within the normal caliber aorta. Scattered reactive appearing adenopathy in the abdomen. Further evaluation is limited given the loculated peritoneal fluid. Reproductive: Patient is post hysterectomy. There is an irregular thickening along the left lateral aspect of the vaginal cuff extending to the pelvic sidewall with loss of discernible fat plane with the adjacent sigmoid colon. Other: Loculated fluid collection throughout the abdomen with a drain in place. Associated peritoneal thickening is noted. Loculations extend into the deep pelvis adjacent the thickened portions of the colon. Scattered foci of gas are noted within these collections. There is abnormal omental nodularity in the left lower quadrant. Suspect additional nodules adjacent the bowel and along the pelvic sidewalls. Circumferential body wall edema. Musculoskeletal: Postsurgical changes from prior L4-5 spinal  fusion. Multilevel degenerative changes are present in the imaged portions of the spine. No acute or suspicious osseous lesions. IMPRESSION: Findings concerning for a large bowel obstruction with a competent ileocecal valve, can may function as a closed loop obstruction of the colon. Increasing colonic stool burden with interval development of colonic wall thickening and edema with hypoattenuation and pneumatosis suspicious for vascular compromise, possibly secondary to pressure necrosis within the colon. Transition point is noted along the left pelvic sidewall where there is irregular soft tissue attenuation and loculated fluid and loss of discernible fat plane with the lateral aspect of the vaginal cuff likely related to patient's underlying ovarian cancer. Additional loculated fluid and omental nodularity throughout the abdomen with scattered foci of gas within the collection. Increasing intra and extrahepatic biliary ductal dilatation, greater than expected for the post cholecystectomy state. No intraductal gallstones. Correlate with liver serologies. Circumferential bladder wall thickening concerning for acute cystitis. Correlate with urinalysis. Moderate bilateral effusions. Aortic Atherosclerosis (ICD10-I70.0). These results were called by telephone at the time of interpretation on 01/18/2019 at 11:55 pm to provider NP Merlene Laughter, who verbally acknowledged these results. Electronically Signed   By: Lovena Le M.D.   On: 01/18/2019 23:50   Dg Abd 2 Views  Result Date: 01/05/2019 CLINICAL DATA:  Nausea and constipation. Productive cough. Metastatic ovarian cancer. Peritoneal drainage catheter. EXAM: ABDOMEN - 2 VIEW COMPARISON:  Radiograph dated 08/10/2018 FINDINGS: There are no dilated loops of large or small bowel. There is extensive stool throughout the colon including in the rectum but there is no discrete fecal impaction. No significant air in the bowel. Ascites. Peritoneal drainage catheter tip  is in the left upper quadrant. There are small bilateral pleural  effusions. Surgical clips from previous cholecystectomy. No acute bone abnormality. IMPRESSION: 1. Extensive stool throughout the colon. No discrete fecal impaction. 2. No findings suggestive of bowel obstruction. 3. Small bilateral pleural effusions.  Ascites. Electronically Signed   By: Lorriane Shire M.D.   On: 01/05/2019 12:59    PERFORMANCE STATUS (ECOG) : 3 - Symptomatic, >50% confined to bed  Review of Systems Unless otherwise noted, a complete review of systems is negative.  Physical Exam General:  frail appearing, thin Pulmonary: CTA ant fields Cardiac: RRR Abdomen: Distended, peritoneal catheter placed, no fluid wave Skin: no rashes, bilateral lower extremity edema Neurological: Weakness but otherwise nonfocal  IMPRESSION: Routine follow-up visit made today with patient and sister.  Patient reports that she has recently returned to baseline following recent hospitalization.  Constipation has been managed with daily MiraLAX and Senokot.  Patient says she has had worsening generalized pain recently and has resumed transdermal fentanyl.  Patient is using two 12 mcg patches and has been taking hydromorphone, which was prescribed by [facility]. Patient feels she had better pain control with use of as needed oxycodone.  We will stop hydromorphone and switch back to oxycodone.  Patient underwent education in the clinic today regarding her Pleurx peritoneal catheter.  Unfortunately, no fluid was able to be removed via the catheter system.  Will order an abdominal ultrasound with paracentesis if needed.  If ascitic fluid is present, will likely need IR to evaluate patency of catheter.  Sister had questions about hospice, which I addressed in detail.  Patient and family have a poor experience under hospice care with their mother due to uncontrolled pain.  She asked about the possible utilization of IV medications at end-of-life if  necessary.  PLAN: -Best supportive care -Abd US/Para -Continue transdermal fentanyl 25 mcg every 72 hours -Stop p.o. hydromorphone -Restart Percocet 10-325mg  Q4H PRN (#90) -Continue bowel regimen with daily Senokot and MiraLAX -Televisit in 2-3 weeks   Patient expressed understanding and was in agreement with this plan. She also understands that She can call the clinic at any time with any questions, concerns, or complaints.     Time Total: 20 minutes  Visit consisted of counseling and education dealing with the complex and emotionally intense issues of symptom management and palliative care in the setting of serious and potentially life-threatening illness.Greater than 50%  of this time was spent counseling and coordinating care related to the above assessment and plan.  Signed by: Altha Harm, PhD, NP-C [phone] (Work Cell)

## 2019-02-01 NOTE — Progress Notes (Signed)
Pt's abd. Distended. Per v/o Josh Borders, rn attempted to drain pt's abd. pleurx cath per hospital policy. Reinforcement teaching for pleurx cath management performed with patient's primary care giver/ sister [name]. Sister participated in the patient's care. Unfortunately, only 1 ml of ascites drained from the pleurx cath. Per v/o Billey Chang, NP - pt sch. For stat u/s paracentesis in medical mall

## 2019-02-01 NOTE — Procedures (Signed)
Interventional Radiology Procedure:   Indications: Ovarian cancer and ascites. Peritoneal catheter is not working.  Procedure: US guided paracentesis  Findings: Removed 1750 ml from LLQ quadrant  Complications: None     EBL: Less than 10 ml  Plan:  Instructed patient to try peritoneal catheter again at her scheduled time.  If the catheter continues to not drain then we will need to remove it and discuss a new catheter placement.   Skylar Flynt R. Anselm Pancoast, MD  Pager: [phone]

## 2019-02-02 ENCOUNTER — Telehealth: Payer: Self-pay | Admitting: Hospice and Palliative Medicine

## 2019-02-02 NOTE — Telephone Encounter (Signed)
I called and spoke with patient's sister about the results of the [place] and paracentesis. Would recommend that patient/family try using the pleurx in about a week and then if no success we will likely need to refer back to IR.

## 2019-02-03 ENCOUNTER — Inpatient Hospital Stay: Payer: PPO | Admitting: Hospice and Palliative Medicine

## 2019-02-04 ENCOUNTER — Inpatient Hospital Stay: Payer: PPO | Admitting: Family Medicine

## 2019-02-15 DIAGNOSIS — N186 End stage renal disease: Secondary | ICD-10-CM | POA: Diagnosis not present

## 2019-02-15 DIAGNOSIS — Z992 Dependence on renal dialysis: Secondary | ICD-10-CM | POA: Diagnosis not present

## 2019-02-16 DIAGNOSIS — D509 Iron deficiency anemia, unspecified: Secondary | ICD-10-CM | POA: Diagnosis not present

## 2019-02-16 DIAGNOSIS — Z992 Dependence on renal dialysis: Secondary | ICD-10-CM | POA: Diagnosis not present

## 2019-02-16 DIAGNOSIS — N184 Chronic kidney disease, stage 4 (severe): Secondary | ICD-10-CM | POA: Diagnosis not present

## 2019-02-16 DIAGNOSIS — N186 End stage renal disease: Secondary | ICD-10-CM | POA: Diagnosis not present

## 2019-02-16 DIAGNOSIS — N179 Acute kidney failure, unspecified: Secondary | ICD-10-CM | POA: Diagnosis not present

## 2019-02-16 DIAGNOSIS — D631 Anemia in chronic kidney disease: Secondary | ICD-10-CM | POA: Diagnosis not present

## 2019-02-17 ENCOUNTER — Other Ambulatory Visit: Payer: Self-pay

## 2019-02-17 ENCOUNTER — Inpatient Hospital Stay: Payer: PPO | Attending: Hospice and Palliative Medicine | Admitting: Hospice and Palliative Medicine

## 2019-02-17 DIAGNOSIS — Z515 Encounter for palliative care: Secondary | ICD-10-CM

## 2019-02-17 DIAGNOSIS — C569 Malignant neoplasm of unspecified ovary: Secondary | ICD-10-CM

## 2019-02-17 DIAGNOSIS — G8929 Other chronic pain: Secondary | ICD-10-CM

## 2019-02-17 DIAGNOSIS — R180 Malignant ascites: Secondary | ICD-10-CM

## 2019-02-17 DIAGNOSIS — N186 End stage renal disease: Secondary | ICD-10-CM

## 2019-02-17 DIAGNOSIS — G893 Neoplasm related pain (acute) (chronic): Secondary | ICD-10-CM | POA: Diagnosis not present

## 2019-02-17 MED ORDER — CYCLOBENZAPRINE HCL 10 MG PO TABS: 10.0000 mg | ORAL_TABLET | Freq: Three times a day (TID) | ORAL | 1 refills | Status: AC | PRN

## 2019-02-17 MED ORDER — OXYCODONE-ACETAMINOPHEN 10-325 MG PO TABS: 1.0000 | ORAL_TABLET | ORAL | 0 refills | Status: AC | PRN

## 2019-02-17 NOTE — Progress Notes (Signed)
Virtual Visit via Telephone Note  I connected with Jessica Burgess on 02/17/19 at  2:00 PM EST by telephone and verified that I am speaking with the correct person using two identifiers.   I discussed the limitations, risks, security and privacy concerns of performing an evaluation and management service by telephone and the availability of in person appointments. I also discussed with the patient that there may be a patient responsible charge related to this service. The patient expressed understanding and agreed to proceed.   History of Present Illness: Palliative Care consult requested for this62 y.o.femalewith multiple medical problems including stage IVhigh-grade serous ovarian carcinoma.She is status post debulking surgery at [facility] in 2018 and received one preoperative dose of Keytruda as part of a clinical trial. Patient completed adjuvant carboplatinum and Taxol, which was discontinued secondary to worsening neuropathy. Patient was also tried on Avastin and doxorubicin due to disease progression but was ultimately switched to gemcitabine secondary to declining performance status.Treatment was ultimately discontinued due to disease progression and intolerance.PMH is also notable for ESRD onhemodialysis,severe diastolic dysfunction with recurrent CHF,severe pulmonary HTN, and recurrent ascites s/p pleurx catheter. Patient wasadmitted to the hospital on 10/31 with severe constipation after failing outpatient management. She was eventually transferred to [facility] where she eventually responded to conservative treatment. Patient was referred to palliative care to help address symptoms and goals.   Observations/Objective: Call and spoke with patient by phone.  She reports that she is slowly declining over the last month.  She is sleeping more and has increased fatigue.  She is still able to go to hemodialysis and says she is tolerating it well.  Patient says her nephrologist recently asked  her about her goals and future discontinuation of hemodialysis.  Patient says she is not ready for that now but noticed that she may be facing it in the near future.  Her immediate goal is to survive through Christmas.  Assessment and Plan:  High grade serous ovarian cancer -best supportive care.  Neoplasm related pain -refill Percocet (#90)  Constipation -continue bowel regimen  Ascites -patient reports that her Pleurx is not draining.  She feels that her abdominal distention has worsened over the last couple weeks.  Will order ultrasound paracentesis and have IR evaluate Pleurx.  [place] - patient continues to receive HD.  She says that nephrology recently asked her if she wanted to continue hemodialysis and she does for now.  She recognizes that she may reach a point when she can no longer tolerate and then would want to transition to comfort care and hospice at home.  Follow Up Instructions: Follow-up telephone visit in 2-3 weeks   I discussed the assessment and treatment plan with the patient. The patient was provided an opportunity to ask questions and all were answered. The patient agreed with the plan and demonstrated an understanding of the instructions.   The patient was advised to call back or seek an in-person evaluation if the symptoms worsen or if the condition fails to improve as anticipated.  I provided 15 minutes of non-face-to-face time during this encounter.   Irean Hong, NP

## 2019-02-20 DIAGNOSIS — I509 Heart failure, unspecified: Secondary | ICD-10-CM | POA: Diagnosis not present

## 2019-02-22 ENCOUNTER — Telehealth: Payer: Self-pay | Admitting: *Deleted

## 2019-02-22 ENCOUNTER — Telehealth: Payer: Self-pay | Admitting: Emergency Medicine

## 2019-02-22 NOTE — Telephone Encounter (Signed)
Called pt and let her know that paracentesis is scheduled for tomorrow, 12/8 at 2:30 with arrival time or 2. Pt verbalized understanding and had no further questions or concerns.

## 2019-02-22 NOTE — Telephone Encounter (Signed)
Patient scheduled for paracentesis tomorrow at 2:30. Pt has been notified.

## 2019-02-22 NOTE — Telephone Encounter (Signed)
Patient called reporting that she was to be scheduled for a paracentesis after her appointment last week and she has not heard anything about appointment and that she is also now having difficulty breathing. Please advise

## 2019-02-22 NOTE — Telephone Encounter (Signed)
I'm following up on this

## 2019-02-23 ENCOUNTER — Other Ambulatory Visit: Payer: Self-pay

## 2019-02-23 ENCOUNTER — Ambulatory Visit
Admission: RE | Admit: 2019-02-23 | Discharge: 2019-02-23 | Disposition: A | Discharge status: Home / Self Care | Payer: PPO | Source: Ambulatory Visit | Attending: Hospice and Palliative Medicine | Admitting: Hospice and Palliative Medicine

## 2019-02-23 DIAGNOSIS — C569 Malignant neoplasm of unspecified ovary: Secondary | ICD-10-CM | POA: Diagnosis not present

## 2019-02-23 DIAGNOSIS — R188 Other ascites: Secondary | ICD-10-CM | POA: Diagnosis not present

## 2019-02-26 ENCOUNTER — Other Ambulatory Visit: Payer: Self-pay | Admitting: Hospice and Palliative Medicine

## 2019-02-26 MED ORDER — FENTANYL 25 MCG/HR TD PT72: 1.0000 | MEDICATED_PATCH | TRANSDERMAL | 0 refills | Status: AC

## 2019-02-26 NOTE — Progress Notes (Signed)
I spoke with patient's sister via phone.  She says the pain has not been fully relieved with transdermal fentanyl 12.5 mcg.  Will refill and increase dose to 25 mcg in 72 hours.  #10 patches  Patient had paracentesis on 02/23/2019 with 1.9 L of ascitic fluid removed.  Unfortunately, IR did not evaluate her Pleurx, which does not appear to be working at present.  I question if ascites has loculated.  We will see if we can refer back to IR for evaluation or removal of the catheter if they are unable to get it working.   Patient sister describes continued decline and poor oral intake.  We discussed expectation of future decline and management of end-of-life care.

## 2019-03-01 ENCOUNTER — Other Ambulatory Visit: Payer: Self-pay | Admitting: Oncology

## 2019-03-01 DIAGNOSIS — C569 Malignant neoplasm of unspecified ovary: Secondary | ICD-10-CM

## 2019-03-01 DIAGNOSIS — R180 Malignant ascites: Secondary | ICD-10-CM

## 2019-03-03 ENCOUNTER — Other Ambulatory Visit: Payer: Self-pay | Admitting: Oncology

## 2019-03-03 ENCOUNTER — Telehealth: Payer: Self-pay | Admitting: Internal Medicine

## 2019-03-03 ENCOUNTER — Inpatient Hospital Stay (HOSPITAL_BASED_OUTPATIENT_CLINIC_OR_DEPARTMENT_OTHER): Payer: PPO | Admitting: Hospice and Palliative Medicine

## 2019-03-03 DIAGNOSIS — K5900 Constipation, unspecified: Secondary | ICD-10-CM | POA: Diagnosis not present

## 2019-03-03 DIAGNOSIS — R188 Other ascites: Secondary | ICD-10-CM

## 2019-03-03 DIAGNOSIS — G893 Neoplasm related pain (acute) (chronic): Secondary | ICD-10-CM

## 2019-03-03 DIAGNOSIS — K219 Gastro-esophageal reflux disease without esophagitis: Secondary | ICD-10-CM

## 2019-03-03 DIAGNOSIS — C569 Malignant neoplasm of unspecified ovary: Secondary | ICD-10-CM

## 2019-03-03 DIAGNOSIS — Z515 Encounter for palliative care: Secondary | ICD-10-CM

## 2019-03-03 NOTE — Progress Notes (Signed)
Virtual Visit via Telephone Note  I connected with Jessica Burgess on 03/03/19 at  2:00 PM EST by telephone and verified that I am speaking with the correct person using two identifiers.   I discussed the limitations, risks, security and privacy concerns of performing an evaluation and management service by telephone and the availability of in person appointments. I also discussed with the patient that there may be a patient responsible charge related to this service. The patient expressed understanding and agreed to proceed.   History of Present Illness: Palliative Care consult requested for this62 y.o.femalewith multiple medical problems including stage IVhigh-grade serous ovarian carcinoma.She is status post debulking surgery at [facility] in 2018 and received one preoperative dose of Keytruda as part of a clinical trial. Patient completed adjuvant carboplatinum and Taxol, which was discontinued secondary to worsening neuropathy. Patient was also tried on Avastin and doxorubicin due to disease progression but was ultimately switched to gemcitabine secondary to declining performance status.Treatment was ultimately discontinued due to disease progression and intolerance.PMH is also notable for ESRD onhemodialysis,severe diastolic dysfunction with recurrent CHF,severe pulmonary HTN, and recurrent ascites s/p pleurx catheter. Patient wasadmitted to the hospital on 10/31 with severe constipation after failing outpatient management. She was eventually transferred to [facility] where she eventually responded to conservative treatment. Patient was referred to palliative care to help address symptoms and goals.   Observations/Objective: Call and spoke with patient by phone.  She admits to overall decline.  She is sleeping more and having more fatigue.  She still attending dialysis sessions and says they are going well.  Appetite is reportedly poor.  She says she is having more reflux symptoms but denies  overt nausea.  Still having regular bowel movements with aggressive bowel regimen.  Patient reports that pain is improved after increasing dose of transdermal fentanyl.  She denies any adverse effects.  Discussed initiation of home-based palliative care and patient was in agreement.  Will eventually need hospice.  Assessment and Plan:  High grade serous ovarian cancer -best supportive care.  Neoplasm related pain -Continue transdermal fentanyl at 25 mcg every 72 hours and Percocet 10-325 mg every 4 hours as needed for breakthrough pain  Constipation -continue bowel regimen  Reflux -May increase Protonix to twice daily x1 week.  Generally would recommend initiation of metoclopramide but I am hesitant given patient's recent history of partial bowel obstruction.  Ascites -most recent paracentesis was on 02/23/2019 with 1.9 L of ascitic fluid removed.  We will continue to require as needed therapeutic paracentesis.  Pleurx is not working so we will have IR evaluate and remove if necessary.  Follow Up Instructions: Follow-up telephone visit in 2 weeks   I discussed the assessment and treatment plan with the patient. The patient was provided an opportunity to ask questions and all were answered. The patient agreed with the plan and demonstrated an understanding of the instructions.   The patient was advised to call back or seek an in-person evaluation if the symptoms worsen or if the condition fails to improve as anticipated.  I provided 8 minutes of non-face-to-face time during this encounter.   Irean Hong, NP

## 2019-03-03 NOTE — Telephone Encounter (Signed)
I was contacted regarding holding of anticoagulation in the setting of Pleur-X catheter removal.  The patient is on apixaban for persistent atrial fibrillation.  I think it is reasonable to hold apixaban for 2 days prior to the procedure and to restart the medication when it is deemed safe to do so by the interventional radiology team.  Nelva Bush, MD [place]

## 2019-03-04 ENCOUNTER — Encounter: Payer: Self-pay | Admitting: Nurse Practitioner

## 2019-03-04 ENCOUNTER — Telehealth: Payer: Self-pay | Admitting: Adult Health Nurse Practitioner

## 2019-03-04 ENCOUNTER — Other Ambulatory Visit: Payer: Self-pay | Admitting: Emergency Medicine

## 2019-03-04 ENCOUNTER — Encounter: Payer: Self-pay | Admitting: Emergency Medicine

## 2019-03-04 DIAGNOSIS — R180 Malignant ascites: Secondary | ICD-10-CM

## 2019-03-04 NOTE — Telephone Encounter (Signed)
Spoke with patient regarding Palliative services and she was in agreement with this.  I have scheduled a Telephone Consult for 03/22/19 @ 4 PM

## 2019-03-05 ENCOUNTER — Other Ambulatory Visit: Payer: Self-pay | Admitting: Oncology

## 2019-03-05 DIAGNOSIS — R180 Malignant ascites: Secondary | ICD-10-CM

## 2019-03-08 ENCOUNTER — Ambulatory Visit
Admission: RE | Admit: 2019-03-08 | Discharge: 2019-03-08 | Disposition: A | Discharge status: Home / Self Care | Payer: PPO | Source: Ambulatory Visit | Attending: Oncology | Admitting: Oncology

## 2019-03-08 ENCOUNTER — Other Ambulatory Visit: Payer: Self-pay

## 2019-03-08 DIAGNOSIS — T85698A Other mechanical complication of other specified internal prosthetic devices, implants and grafts, initial encounter: Secondary | ICD-10-CM | POA: Diagnosis not present

## 2019-03-08 DIAGNOSIS — C801 Malignant (primary) neoplasm, unspecified: Secondary | ICD-10-CM | POA: Insufficient documentation

## 2019-03-08 DIAGNOSIS — R188 Other ascites: Secondary | ICD-10-CM | POA: Diagnosis not present

## 2019-03-08 DIAGNOSIS — R180 Malignant ascites: Secondary | ICD-10-CM | POA: Insufficient documentation

## 2019-03-08 DIAGNOSIS — Z4803 Encounter for change or removal of drains: Secondary | ICD-10-CM | POA: Insufficient documentation

## 2019-03-10 ENCOUNTER — Other Ambulatory Visit: Payer: Self-pay | Admitting: Family Medicine

## 2019-03-10 DIAGNOSIS — I5022 Chronic systolic (congestive) heart failure: Secondary | ICD-10-CM

## 2019-03-11 ENCOUNTER — Other Ambulatory Visit: Payer: Self-pay | Admitting: Hospice and Palliative Medicine

## 2019-03-11 DIAGNOSIS — J40 Bronchitis, not specified as acute or chronic: Secondary | ICD-10-CM

## 2019-03-11 MED ORDER — ALBUTEROL SULFATE (2.5 MG/3ML) 0.083% IN NEBU: 2.5000 mg | INHALATION_SOLUTION | Freq: Four times a day (QID) | RESPIRATORY_TRACT | 2 refills | Status: AC | PRN

## 2019-03-11 MED ORDER — AZITHROMYCIN 250 MG PO TABS: ORAL_TABLET | ORAL | 0 refills | Status: AC

## 2019-03-11 MED ORDER — BENZONATATE 100 MG PO CAPS: 100.0000 mg | ORAL_CAPSULE | Freq: Three times a day (TID) | ORAL | 0 refills | Status: AC | PRN

## 2019-03-11 NOTE — Progress Notes (Signed)
I received a call from patient's sister.  Patient continues to decline.  She has developed a cough and shortness of breath.  She had her dialysis session yesterday and tolerated well per sister.  Unknown if she has fever or chills.  Sister thinks that patient has aspirated or is developing a pneumonia as she has done in the past.  We discussed work-up with Covid testing/ER evaluation or hospitalization if needed.  However, sister says that patient's goal is to stay home through the holidays and focus more on comfort.  She is not interested in hospitalization at the present time.  Patient has been declining over the past weeks and months and family recognize that she is likely nearing end-of-life.  I offered a hospice referral but that would entail discontinuation of hemodialysis.  Sister wants to hold off on hospice for now but reiterates that their ultimate goal is comfort.  She wants to readdress after the holiday.  Plan: -Refill albuterol nebs -Start azithromycin p.o. 500 mg x 1 day and then 250 mg x 4 days -Benzonatate 100 to 200 mg 3 times daily as needed for cough -Patient is a DNAR/DNI/Comfort measures only with MOST form in [place] -We will schedule follow-up telephone visit next week

## 2019-03-15 ENCOUNTER — Telehealth: Payer: Self-pay | Admitting: Hospice and Palliative Medicine

## 2019-03-15 NOTE — Telephone Encounter (Signed)
I received a call from patient's sister.  Patient is reportedly declining.  Cough was helped somewhat with use of benzonatate and she has been using the albuterol nebs but did not start the azithromycin.  Patient reportedly skipped hemodialysis on Saturday and has decided to forego future dialysis treatments.  Hospice was requested.  At family's request, I called in a verbal order for hospice at home.  We discussed use of residential hospice if needed but patient and sister have had a previously poor experience with the care of their mother.  Case discussed with Drs. Finnegan, Lateef, and End.

## 2019-03-16 ENCOUNTER — Telehealth: Payer: Self-pay | Admitting: Hospice and Palliative Medicine

## 2019-03-16 MED ORDER — OXYCODONE HCL 5 MG/5ML PO SOLN: 5.0000 mg | ORAL | 0 refills | Status: DC | PRN

## 2019-03-16 MED ORDER — HYDROMORPHONE HCL 1 MG/ML PO LIQD: 0.5000 mg | ORAL | 0 refills | Status: AC | PRN

## 2019-03-16 NOTE — Addendum Note (Signed)
Addended by: Altha Harm R on: 03/16/2019 03:02 PM   Modules accepted: Orders

## 2019-03-16 NOTE — Telephone Encounter (Signed)
I spoke with patient's hospice nurse by phone.  She saw patient this morning in the home.  Patient is having some difficulty swallowing her pills.  Given her ESRD, will rotate to hydromorphone elixir, which should lessen risk of metabolites from morphine.

## 2019-03-16 NOTE — Telephone Encounter (Signed)
Addendum:  Received a call back from hospice nurse that hydromorphone was not in stock and could not be ordered from the pharmacy. Will switch to oxycodone elixir.

## 2019-03-17 ENCOUNTER — Inpatient Hospital Stay (HOSPITAL_BASED_OUTPATIENT_CLINIC_OR_DEPARTMENT_OTHER): Payer: PPO | Admitting: Hospice and Palliative Medicine

## 2019-03-17 ENCOUNTER — Encounter: Payer: Self-pay | Admitting: Nurse Practitioner

## 2019-03-17 DIAGNOSIS — C569 Malignant neoplasm of unspecified ovary: Secondary | ICD-10-CM

## 2019-03-17 DIAGNOSIS — G893 Neoplasm related pain (acute) (chronic): Secondary | ICD-10-CM

## 2019-03-17 DIAGNOSIS — Z515 Encounter for palliative care: Secondary | ICD-10-CM

## 2019-03-17 MED ORDER — OXYCODONE HCL 5 MG/5ML PO SOLN: 5.0000 mg | ORAL | 0 refills | Status: AC | PRN

## 2019-03-17 NOTE — Telephone Encounter (Signed)
Sent on 12/23 #120

## 2019-03-17 NOTE — Progress Notes (Signed)
Virtual Visit via Telephone Note  I connected with Jessica Burgess on 03/17/19 at  2:00 PM EST by telephone and verified that I am speaking with the correct person using two identifiers.   I discussed the limitations, risks, security and privacy concerns of performing an evaluation and management service by telephone and the availability of in person appointments. I also discussed with the patient that there may be a patient responsible charge related to this service. The patient expressed understanding and agreed to proceed.   History of Present Illness: Palliative Care consult requested for this62 y.o.femalewith multiple medical problems including stage IVhigh-grade serous ovarian carcinoma.She is status post debulking surgery at [facility] in 2018 and received one preoperative dose of Keytruda as part of a clinical trial. Patient completed adjuvant carboplatinum and Taxol, which was discontinued secondary to worsening neuropathy. Patient was also tried on Avastin and doxorubicin due to disease progression but was ultimately switched to gemcitabine secondary to declining performance status.Treatment was ultimately discontinued due to disease progression and intolerance.PMH is also notable for ESRD onhemodialysis,severe diastolic dysfunction with recurrent CHF,severe pulmonary HTN, and recurrent ascites s/p pleurx catheter. Patient wasadmitted to the hospital on 10/31 with severe constipation after failing outpatient management. She was eventually transferred to [facility] where she eventually responded to conservative treatment. Patient is now being followed at home by hospice.   Observations/Objective: Call and spoke with patient's sister.  I also spoke with the hospice nurse today by phone.  Patient is actively declining.  I switched her to oxycodone elixir due to difficulty swallowing and pain has been reportedly better controlled.  We will need to refill oxycodone elixir today as pharmacy was  not able to feel the full prescription.    Discussed end of life symptoms with sister. She recognizes that patient's life expectancy is likely measured in days.   Assessment and Plan:  High grade serous ovarian cancer -comfort/hospice care at home  Neoplasm related pain -Continue transdermal fentanyl at 50 mcg every 72 hours and oxycodone elixir 5 to 10 mg every 4 hours as needed for breakthrough pain  Follow Up Instructions: Follow-up telephone visit as needed   I discussed the assessment and treatment plan with the patient. The patient was provided an opportunity to ask questions and all were answered. The patient agreed with the plan and demonstrated an understanding of the instructions.   The patient was advised to call back or seek an in-person evaluation if the symptoms worsen or if the condition fails to improve as anticipated.  I provided 8 minutes of non-face-to-face time during this encounter.   Irean Hong, NP

## 2019-03-18 DIAGNOSIS — Z992 Dependence on renal dialysis: Secondary | ICD-10-CM | POA: Diagnosis not present

## 2019-03-18 DIAGNOSIS — N186 End stage renal disease: Secondary | ICD-10-CM | POA: Diagnosis not present

## 2019-03-19 DEATH — deceased

## 2019-03-22 ENCOUNTER — Other Ambulatory Visit: Payer: Self-pay | Admitting: Adult Health Nurse Practitioner

## 2019-03-22 ENCOUNTER — Other Ambulatory Visit: Payer: Self-pay

## 2019-04-26 ENCOUNTER — Ambulatory Visit: Payer: PPO | Admitting: Family Medicine

## 2019-09-09 ENCOUNTER — Ambulatory Visit: Payer: PPO

## 2020-04-14 IMAGING — CT CT CHEST WITHOUT CONTRAST
2 of 3 series · 15 of 36 positions shown, 18 images · non-contrast
Comparison: PET-CT, 05/07/2018, CT chest, 02/02/2018

CLINICAL DATA: Hemoptysis, ovarian cancer

EXAM:
CT CHEST WITHOUT CONTRAST
TECHNIQUE: Multidetector CT imaging of the chest was performed following the
standard protocol without IV contrast.

[Series 2: thorax · axial · 0.63mm/px · z∈[-339,-77]mm · 12 of 155 slices shown, 15 images]
[im 12/155  mediastinal]
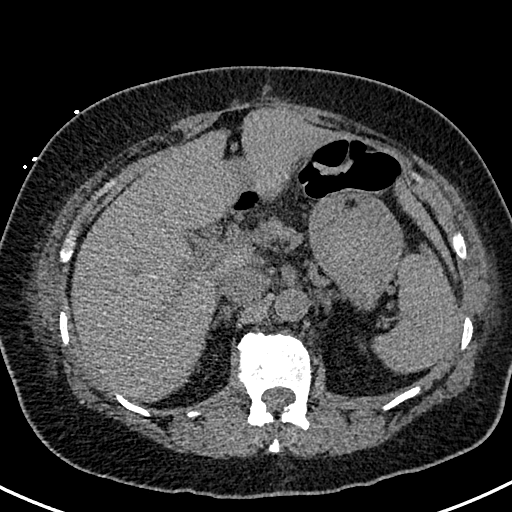
[im 12/155  lung]
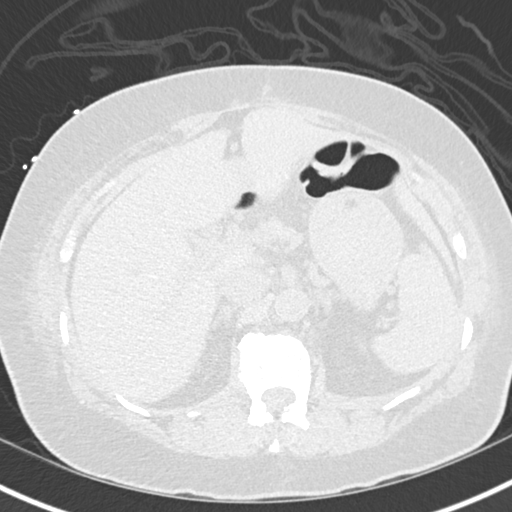
[im 23/155  lung]
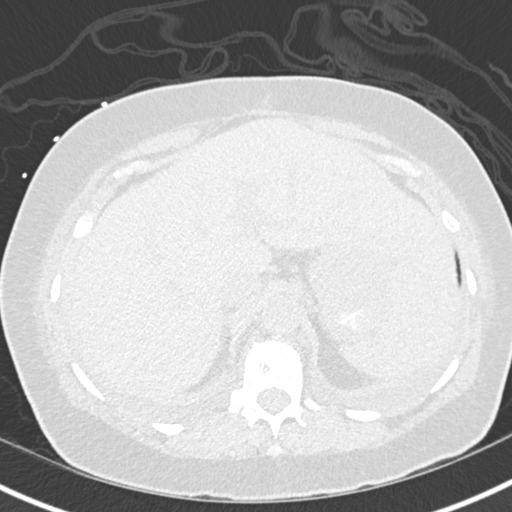
[im 35/155  lung]
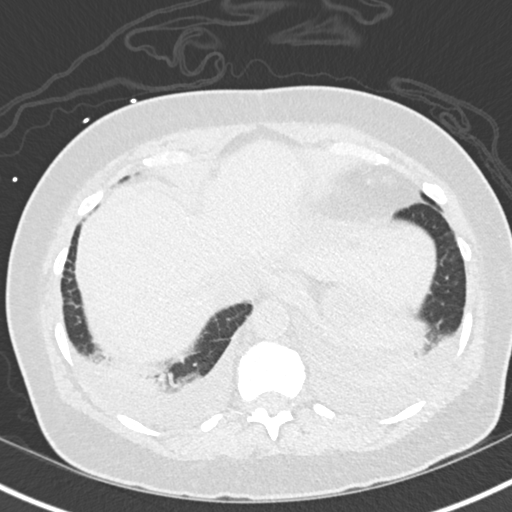
[im 46/155  lung]
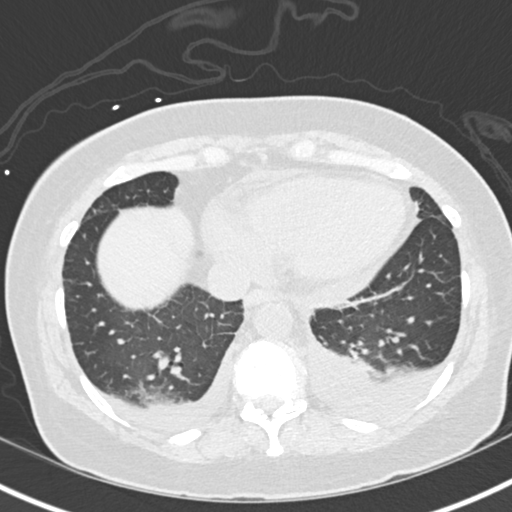
[im 58/155  mediastinal]
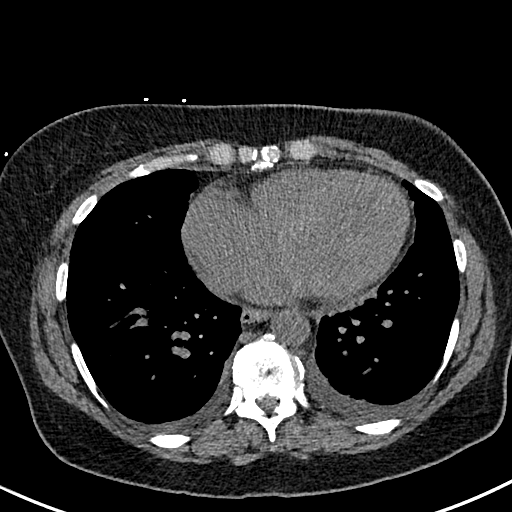
[im 58/155  lung]
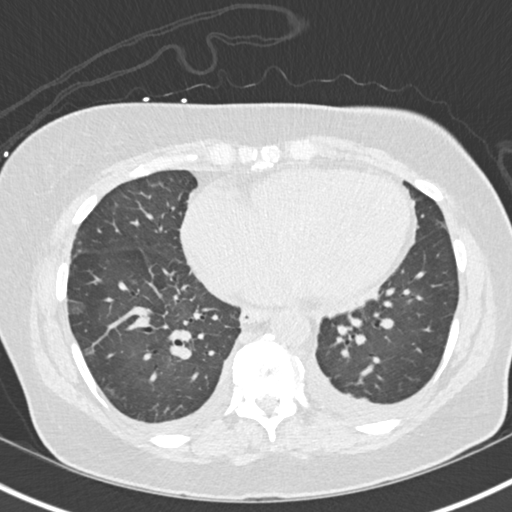
[im 69/155  lung]
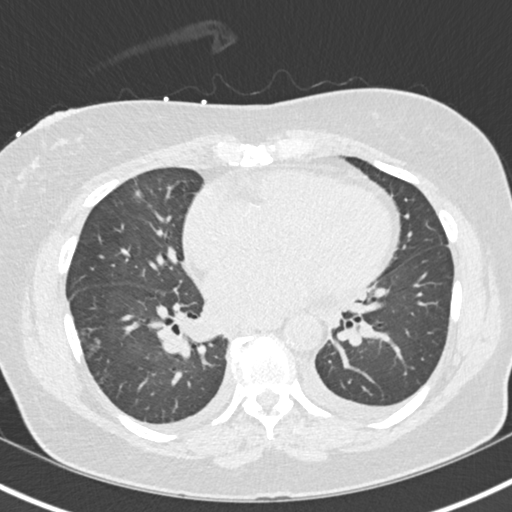
[im 86/155  lung]
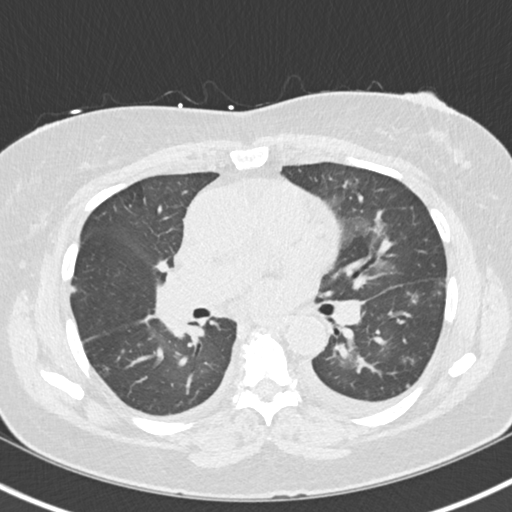
[im 97/155  lung]
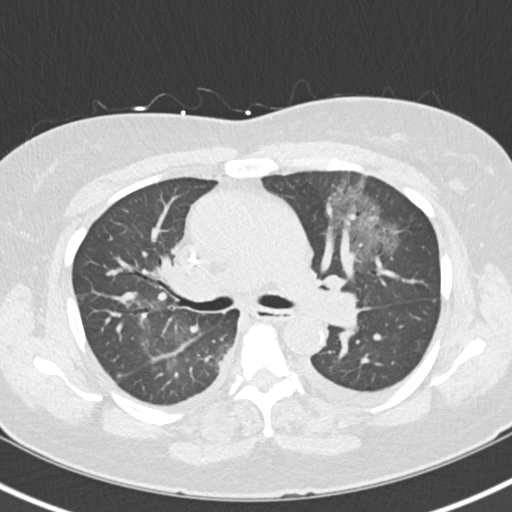
[im 109/155  mediastinal]
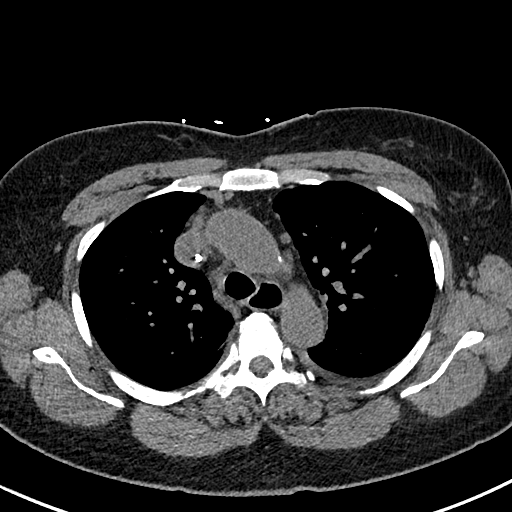
[im 109/155  lung]
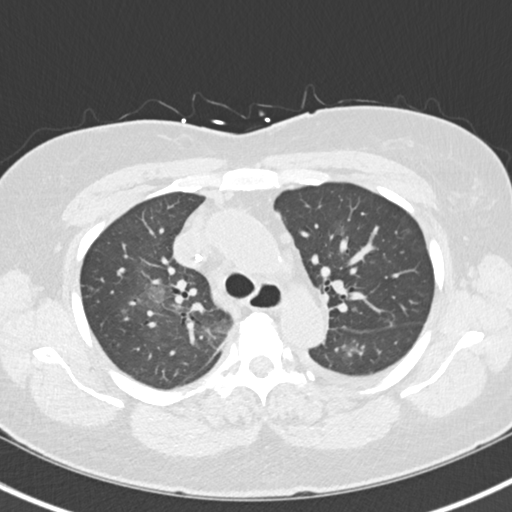
[im 120/155  lung]
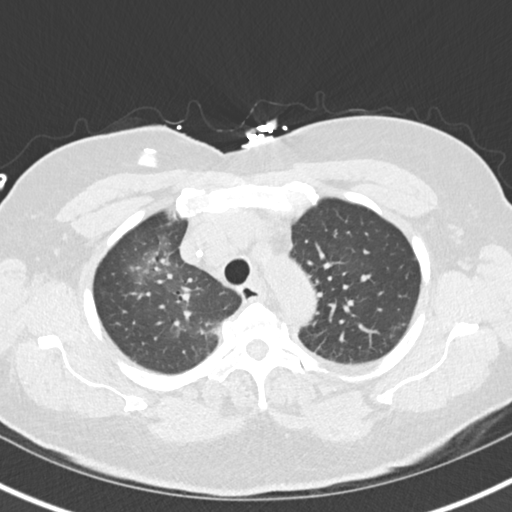
[im 132/155  lung]
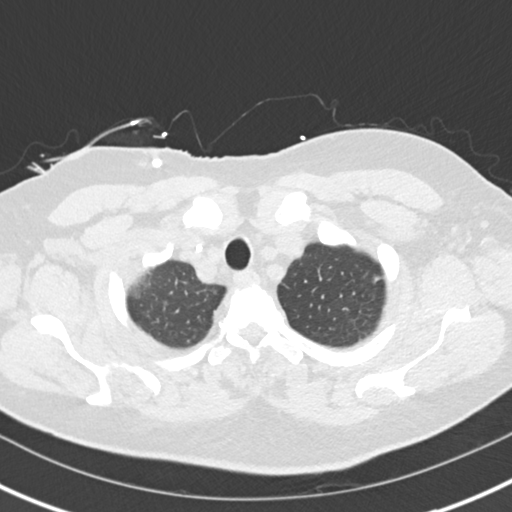
[im 143/155  lung]
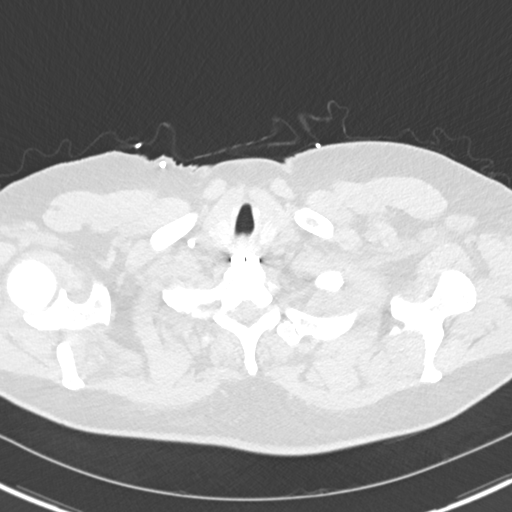

[Series 5: coronal · coronal · 0.64mm/px · 3 of 135 slices shown]
[im 27/135  lung]
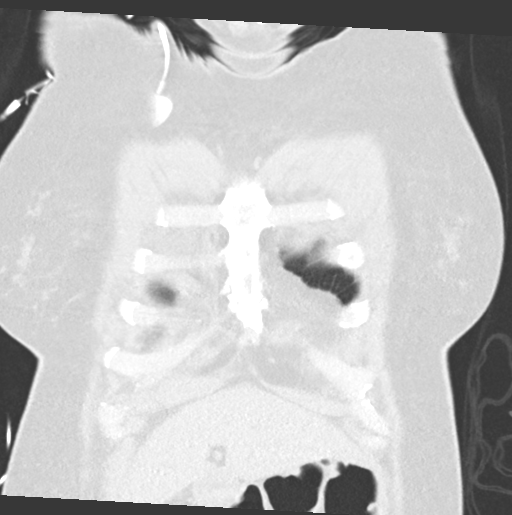
[im 54/135  lung]
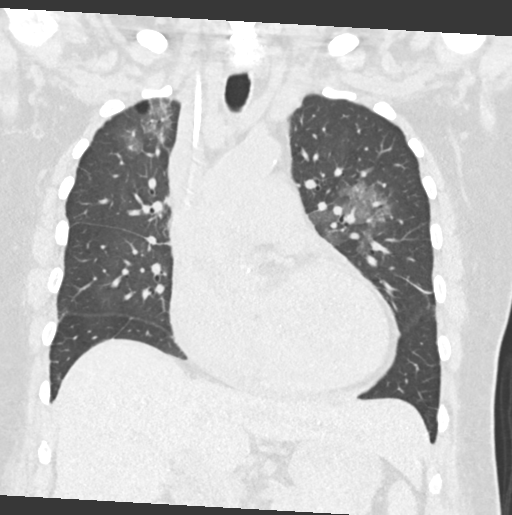
[im 81/135  lung]
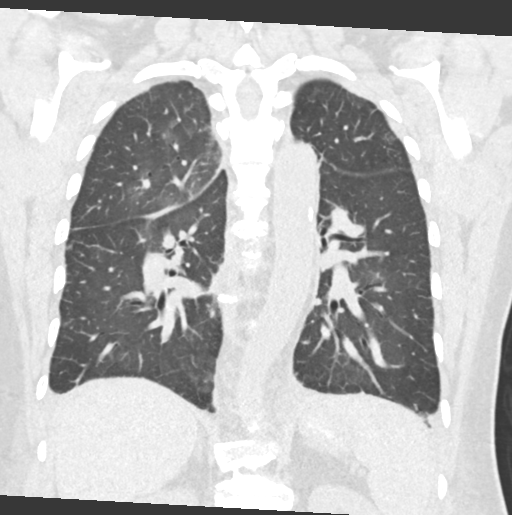

[15 of 36 positions shown; findings below may reference images not displayed]

FINDINGS: Cardiovascular: Cardiomegaly. Trace pericardial effusion. Right
chest port catheter.

Mediastinum/Nodes: No enlarged mediastinal, hilar, or axillary lymph
nodes. Thyroid gland, trachea, and esophagus demonstrate no
significant findings.

Lungs/Pleura: There is scattered bilateral ground-glass and
clustered nodular pulmonary opacity, most conspicuous in the
bilateral upper lobes and new compared to prior PET-CT dated
05/07/2018 (series 3, image 58). There is a new 3 mm subpleural
pulmonary nodule of the left pulmonary apex (series 3, image 23) and
a new 4 mm subpleural nodule of the right middle lobe (series 3,
image 70). There is a thin-walled pneumatocele unchanged from prior
examination. Small bilateral pleural effusions and associated
atelectasis or consolidation.

Upper Abdomen: No acute abnormality.

Musculoskeletal: No chest wall mass or suspicious bone lesions
identified.
IMPRESSION: 1. There is scattered bilateral ground-glass and clustered nodular
pulmonary opacity, most conspicuous in the bilateral upper lobes and
new compared to prior PET-CT dated 05/07/2018 (series 3, image 58).
Small bilateral pleural effusions and associated atelectasis or
consolidation. Findings are consistent with atypical infection or
inflammation. Drug toxicity is a significant differential
consideration in the setting of malignancy.

2. There is a new 3 mm subpleural pulmonary nodule of the left
pulmonary apex (series 3, image 23) and a new 4 mm subpleural nodule
of the right middle lobe (series 3, image 70). These are nonspecific
and may be infectious or inflammatory although concerning for
metastatic disease in the setting of primary ovarian malignancy.
Attention on follow-up.

## 2020-04-15 IMAGING — DX PORTABLE ABDOMEN - 1 VIEW
1 series · 1 of 1 positions shown · non-contrast
Comparison: Abdominal CT 08/06/2018.

CLINICAL DATA: Dialysis catheter insertion.

EXAM:
PORTABLE ABDOMEN - 1 VIEW

[abdomen supine]
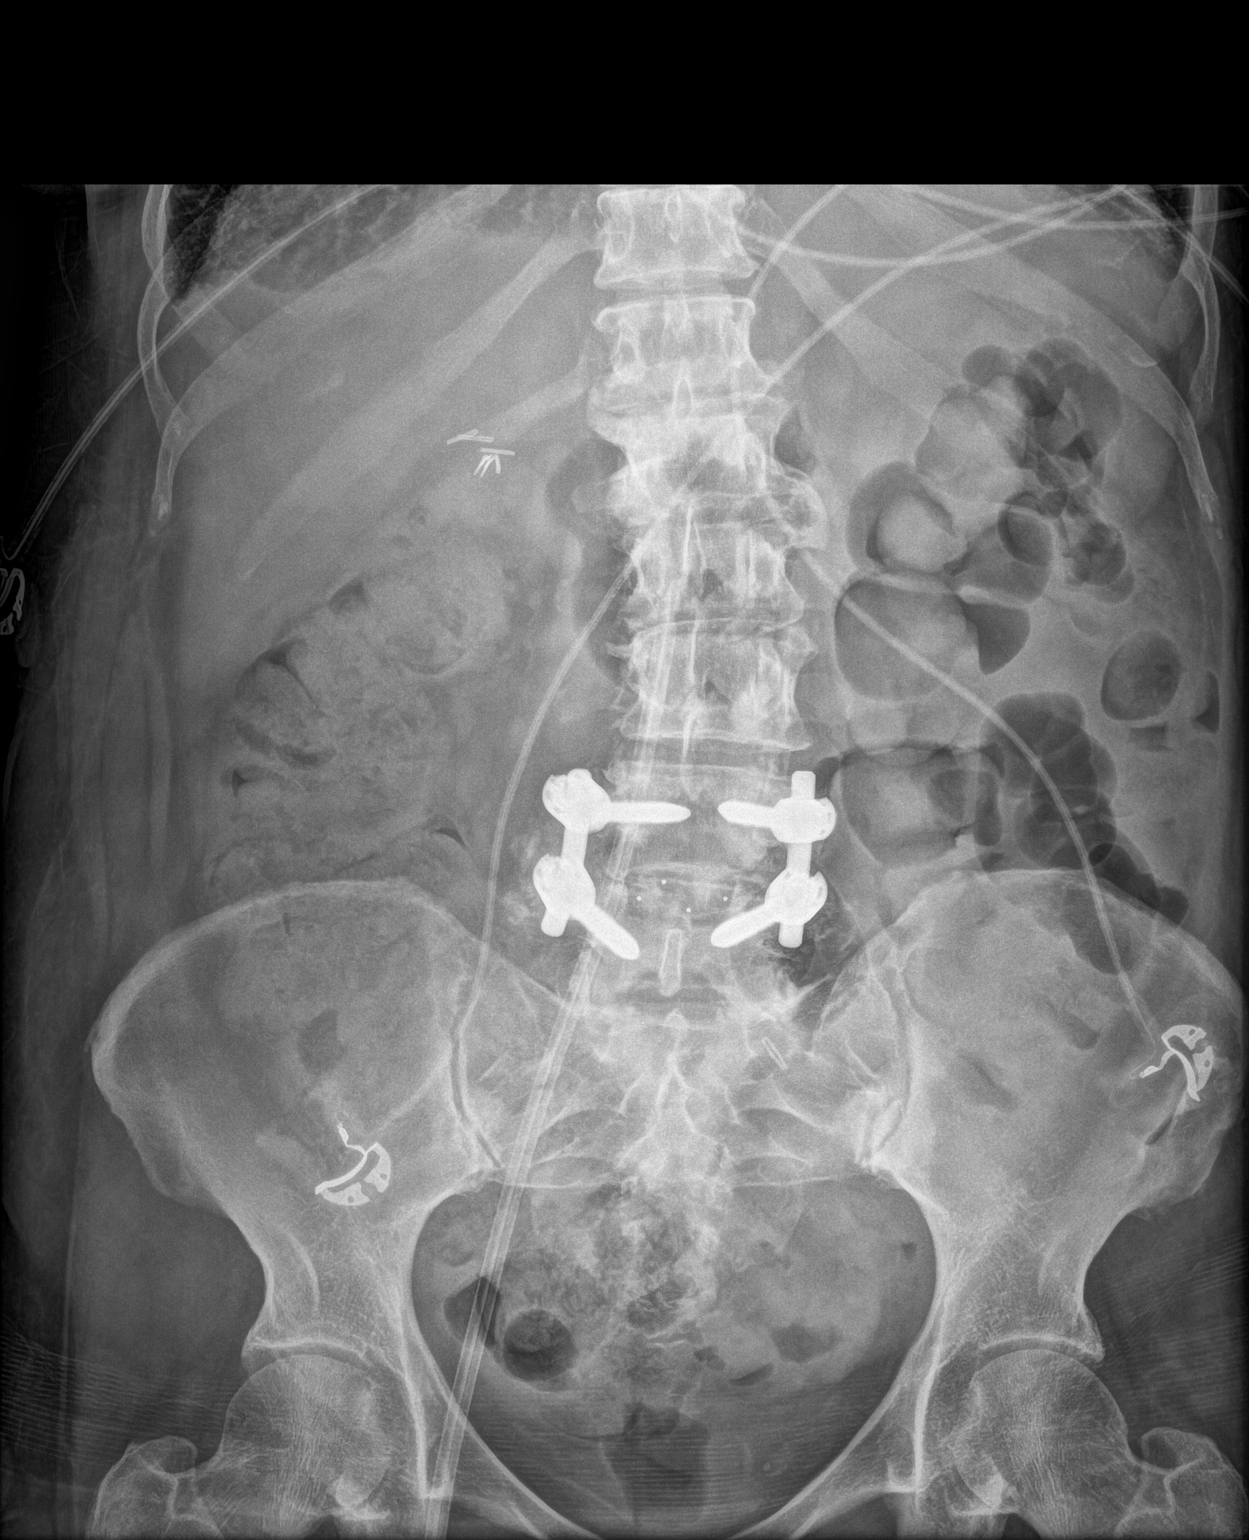

[1 of 1 positions shown; findings below may reference images not displayed]

FINDINGS: Right femoral dialysis catheter extends to the L2-3 disc space
level, corresponding with the infrarenal IVC on prior CT. The bowel
gas pattern is normal. Patient is status post cholecystectomy and
L4-5 fusion.
IMPRESSION: Hemodialysis catheter tip at the L2-3 disc space level.

## 2020-04-18 IMAGING — DX PORTABLE CHEST - 1 VIEW
1 series · 1 of 1 positions shown · non-contrast
Comparison: Radiographs August 12, 2018.

CLINICAL DATA: Shortness of breath.

EXAM:
PORTABLE CHEST 1 VIEW

[chest ap]
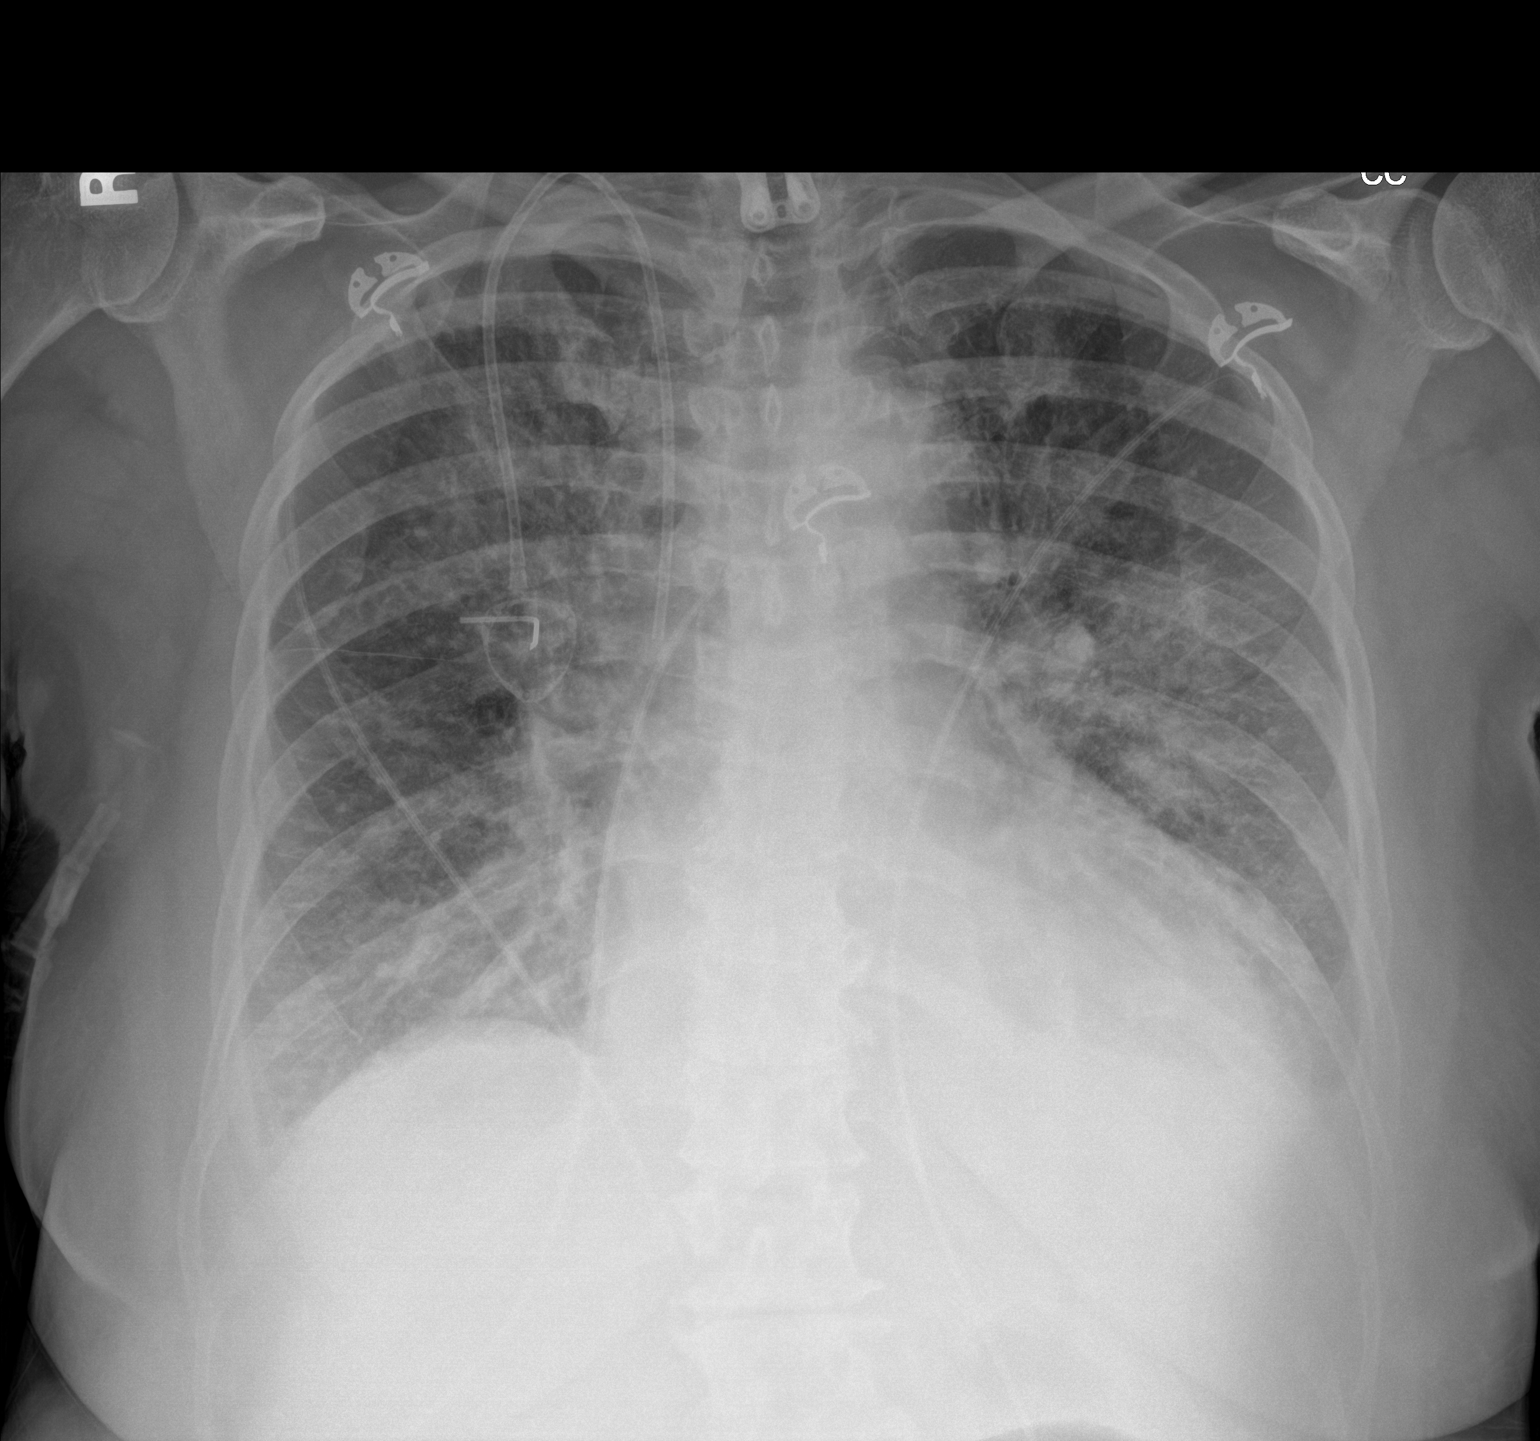

[1 of 1 positions shown; findings below may reference images not displayed]

FINDINGS: Stable cardiomegaly with central pulmonary vascular congestion.
Right internal jugular Port-A-Cath is again noted. No pneumothorax
is noted. Increased interstitial densities are noted throughout both
lungs most consistent with pulmonary edema. Small bilateral pleural
effusions are noted. Bony thorax is unremarkable.
IMPRESSION: Stable cardiomegaly with central pulmonary vascular congestion.
Increased bilateral interstitial densities are noted consistent with
worsening pulmonary edema. Small bilateral pleural effusions are
noted.

## 2020-04-20 IMAGING — DX PORTABLE CHEST - 1 VIEW
1 series · 2 of 2 positions shown · non-contrast
Comparison: 08/13/2018, 08/12/2018, 08/07/2018

CLINICAL DATA: Catheter placement

EXAM:
PORTABLE CHEST 1 VIEW

[Series 1: chest ap · 0.14mm/px · 2 of 2 slices shown]
[im 1/2]
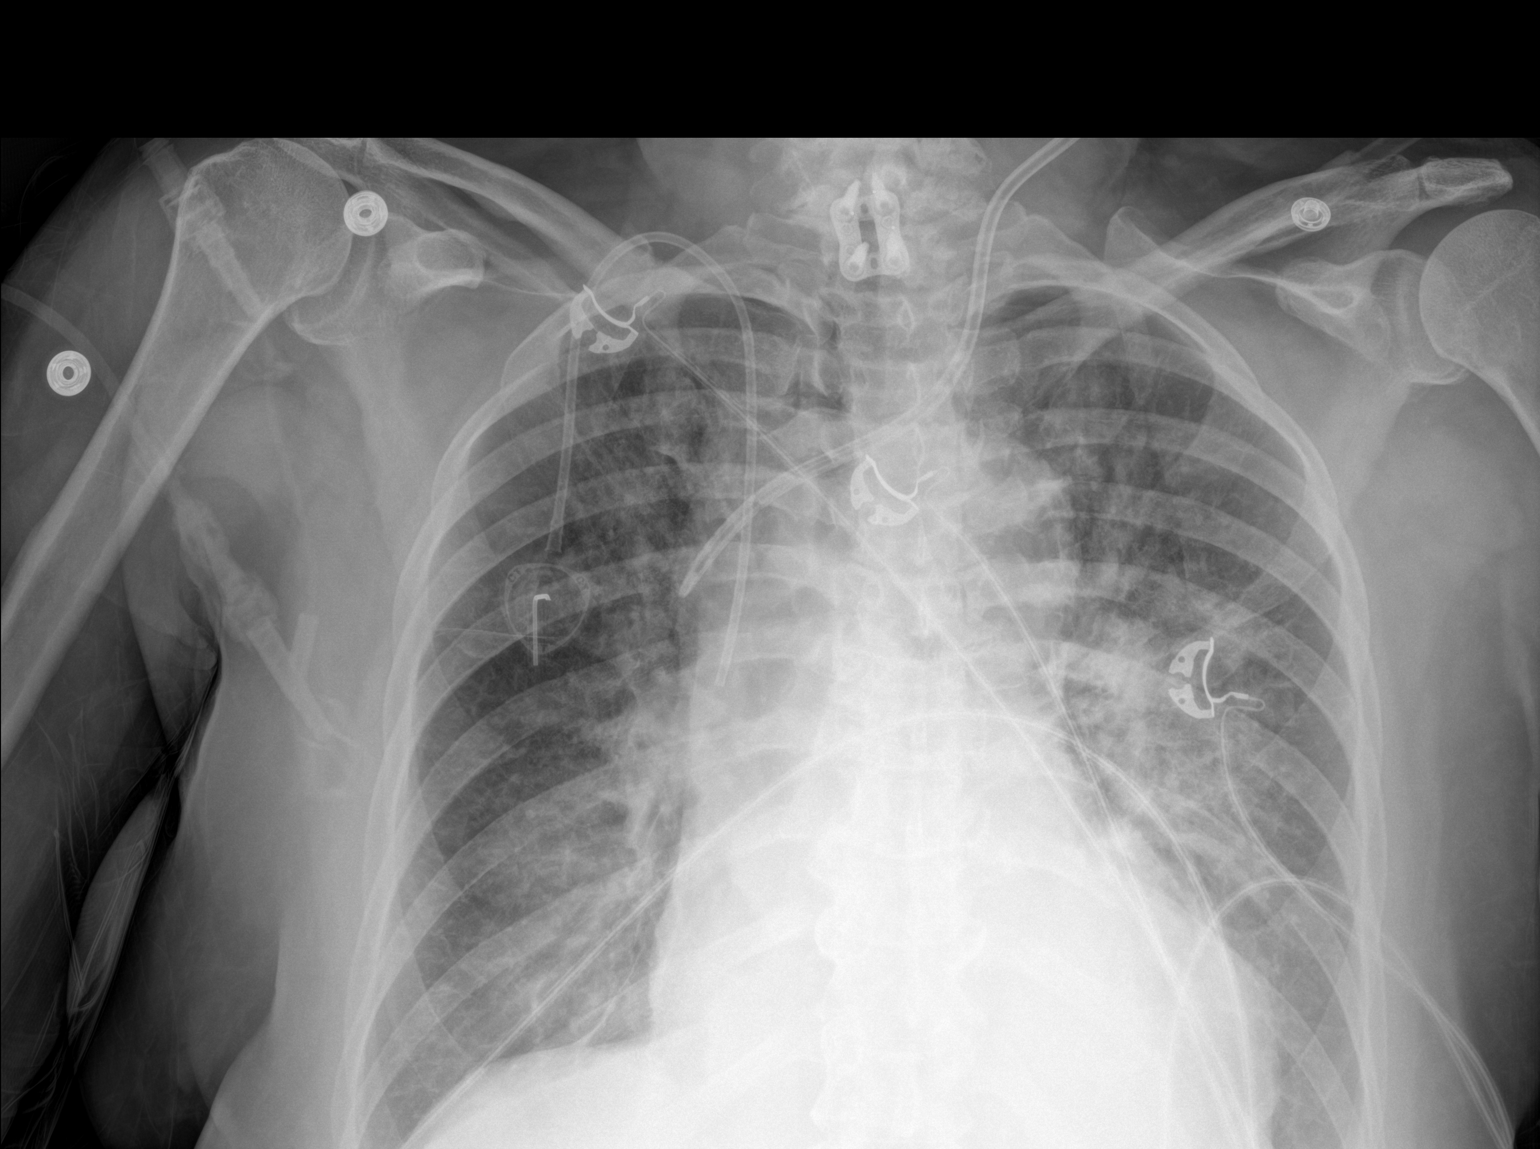
[im 2/2]
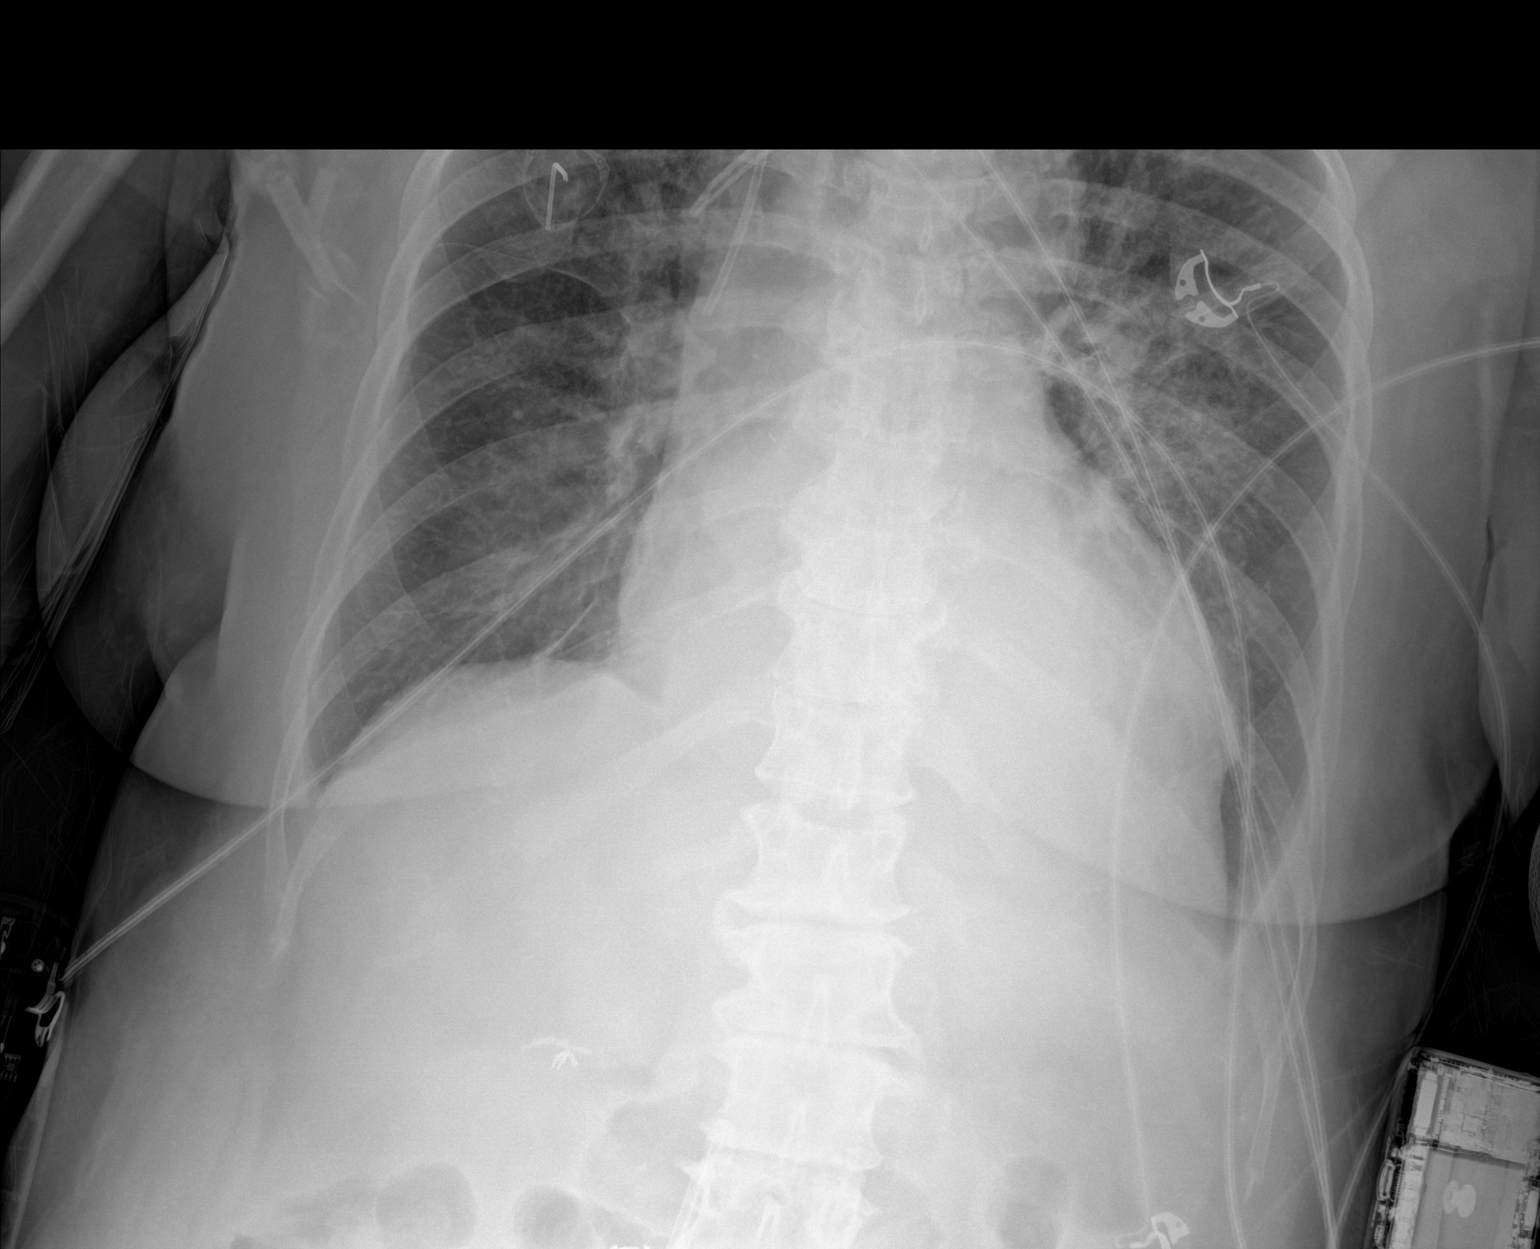

[2 of 2 positions shown; findings below may reference images not displayed]

FINDINGS: Right-sided central venous port tip over the SVC. New left IJ
central venous catheter tip over the SVC. No pneumothorax.
Cardiomegaly with vascular congestion and bilateral perihilar hazy
and ground-glass opacities suspect for edema. Small bilateral
effusions. Left basilar consolidation.
IMPRESSION: 1. Left IJ central venous catheter tip over the SVC. No
pneumothorax.
2. Cardiomegaly with vascular congestion, moderate pulmonary edema
and continued small pleural effusions. Left basilar consolidation
without change.
